# Patient Record
Sex: Male | Born: 1940 | Race: White | Hispanic: No | Marital: Married | Smoking: Former smoker
Health system: Southern US, Community
[De-identification: ages and names within clinical notes are randomized; demographics above are authoritative.]

## PROBLEM LIST (undated history)

## (undated) DIAGNOSIS — I4819 Other persistent atrial fibrillation: Secondary | ICD-10-CM

## (undated) DIAGNOSIS — N289 Disorder of kidney and ureter, unspecified: Secondary | ICD-10-CM

## (undated) DIAGNOSIS — K859 Acute pancreatitis without necrosis or infection, unspecified: Secondary | ICD-10-CM

## (undated) DIAGNOSIS — J449 Chronic obstructive pulmonary disease, unspecified: Secondary | ICD-10-CM

## (undated) DIAGNOSIS — I872 Venous insufficiency (chronic) (peripheral): Secondary | ICD-10-CM

## (undated) DIAGNOSIS — Z7901 Long term (current) use of anticoagulants: Secondary | ICD-10-CM

## (undated) DIAGNOSIS — C679 Malignant neoplasm of bladder, unspecified: Secondary | ICD-10-CM

## (undated) DIAGNOSIS — K219 Gastro-esophageal reflux disease without esophagitis: Secondary | ICD-10-CM

## (undated) DIAGNOSIS — M1990 Unspecified osteoarthritis, unspecified site: Secondary | ICD-10-CM

## (undated) DIAGNOSIS — I5032 Chronic diastolic (congestive) heart failure: Secondary | ICD-10-CM

## (undated) DIAGNOSIS — F329 Major depressive disorder, single episode, unspecified: Secondary | ICD-10-CM

## (undated) DIAGNOSIS — G629 Polyneuropathy, unspecified: Secondary | ICD-10-CM

## (undated) DIAGNOSIS — G4733 Obstructive sleep apnea (adult) (pediatric): Secondary | ICD-10-CM

## (undated) DIAGNOSIS — E039 Hypothyroidism, unspecified: Secondary | ICD-10-CM

## (undated) DIAGNOSIS — I495 Sick sinus syndrome: Secondary | ICD-10-CM

## (undated) DIAGNOSIS — Z8619 Personal history of other infectious and parasitic diseases: Secondary | ICD-10-CM

## (undated) DIAGNOSIS — Z9989 Dependence on other enabling machines and devices: Secondary | ICD-10-CM

## (undated) DIAGNOSIS — E785 Hyperlipidemia, unspecified: Secondary | ICD-10-CM

## (undated) DIAGNOSIS — F32A Depression, unspecified: Secondary | ICD-10-CM

## (undated) HISTORY — DX: Venous insufficiency (chronic) (peripheral): I87.2

## (undated) HISTORY — DX: Morbid (severe) obesity due to excess calories: E66.01

## (undated) HISTORY — DX: Chronic diastolic (congestive) heart failure: I50.32

## (undated) HISTORY — DX: Other persistent atrial fibrillation: I48.19

## (undated) HISTORY — DX: Malignant neoplasm of bladder, unspecified: C67.9

## (undated) HISTORY — DX: Obstructive sleep apnea (adult) (pediatric): G47.33

## (undated) HISTORY — DX: Hyperlipidemia, unspecified: E78.5

## (undated) HISTORY — DX: Obstructive sleep apnea (adult) (pediatric): Z99.89

## (undated) HISTORY — DX: Sick sinus syndrome: I49.5

## (undated) HISTORY — DX: Unspecified osteoarthritis, unspecified site: M19.90

## (undated) HISTORY — PX: WISDOM TOOTH EXTRACTION: SHX21

## (undated) HISTORY — DX: Acute pancreatitis without necrosis or infection, unspecified: K85.90

## (undated) HISTORY — DX: Chronic obstructive pulmonary disease, unspecified: J44.9

## (undated) HISTORY — PX: BELPHAROPTOSIS REPAIR: SHX369

## (undated) HISTORY — DX: Hypothyroidism, unspecified: E03.9

## (undated) HISTORY — PX: CARDIAC CATHETERIZATION: SHX172

## (undated) HISTORY — PX: EYE SURGERY: SHX253

## (undated) HISTORY — DX: Disorder of kidney and ureter, unspecified: N28.9

---
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
## 1965-12-08 HISTORY — PX: NASAL SEPTUM SURGERY: SHX37

## 1998-06-22 ENCOUNTER — Ambulatory Visit (HOSPITAL_COMMUNITY): Admission: RE | Admit: 1998-06-22 | Discharge: 1998-06-22 | Payer: Self-pay | Admitting: Family Medicine

## 1998-07-03 ENCOUNTER — Encounter: Admission: RE | Admit: 1998-07-03 | Discharge: 1998-10-01 | Payer: Self-pay | Admitting: Family Medicine

## 2001-12-08 DIAGNOSIS — Z8619 Personal history of other infectious and parasitic diseases: Secondary | ICD-10-CM

## 2001-12-08 HISTORY — DX: Personal history of other infectious and parasitic diseases: Z86.19

## 2002-02-10 ENCOUNTER — Inpatient Hospital Stay (HOSPITAL_COMMUNITY): Admission: RE | Admit: 2002-02-10 | Discharge: 2002-03-03 | Payer: Self-pay | Admitting: Family Medicine

## 2002-02-10 ENCOUNTER — Encounter: Payer: Self-pay | Admitting: Family Medicine

## 2002-02-10 ENCOUNTER — Encounter: Payer: Self-pay | Admitting: Critical Care Medicine

## 2002-02-12 ENCOUNTER — Encounter: Payer: Self-pay | Admitting: Pulmonary Disease

## 2002-02-13 ENCOUNTER — Encounter: Payer: Self-pay | Admitting: Pulmonary Disease

## 2002-02-15 ENCOUNTER — Encounter: Payer: Self-pay | Admitting: Pulmonary Disease

## 2002-02-16 ENCOUNTER — Encounter: Payer: Self-pay | Admitting: Pulmonary Disease

## 2002-02-17 ENCOUNTER — Encounter: Payer: Self-pay | Admitting: Pulmonary Disease

## 2002-02-18 ENCOUNTER — Encounter: Payer: Self-pay | Admitting: Pulmonary Disease

## 2002-02-19 ENCOUNTER — Encounter: Payer: Self-pay | Admitting: Pulmonary Disease

## 2002-02-20 ENCOUNTER — Encounter: Payer: Self-pay | Admitting: Pulmonary Disease

## 2002-02-21 ENCOUNTER — Encounter: Payer: Self-pay | Admitting: Pulmonary Disease

## 2002-02-22 ENCOUNTER — Encounter: Payer: Self-pay | Admitting: Critical Care Medicine

## 2002-02-26 ENCOUNTER — Encounter: Payer: Self-pay | Admitting: Pulmonary Disease

## 2002-02-27 ENCOUNTER — Encounter: Payer: Self-pay | Admitting: Pulmonary Disease

## 2002-02-28 ENCOUNTER — Encounter: Payer: Self-pay | Admitting: Pulmonary Disease

## 2002-03-02 ENCOUNTER — Encounter: Payer: Self-pay | Admitting: Pulmonary Disease

## 2002-03-03 ENCOUNTER — Inpatient Hospital Stay (HOSPITAL_COMMUNITY)
Admission: AD | Admit: 2002-03-03 | Discharge: 2002-03-11 | Payer: Self-pay | Admitting: Physical Medicine & Rehabilitation

## 2002-06-15 ENCOUNTER — Encounter: Payer: Self-pay | Admitting: Cardiology

## 2002-06-15 ENCOUNTER — Ambulatory Visit (HOSPITAL_COMMUNITY): Admission: RE | Admit: 2002-06-15 | Discharge: 2002-06-15 | Payer: Self-pay | Admitting: Cardiology

## 2002-07-29 ENCOUNTER — Encounter: Payer: Self-pay | Admitting: Pulmonary Disease

## 2002-09-16 ENCOUNTER — Ambulatory Visit (HOSPITAL_BASED_OUTPATIENT_CLINIC_OR_DEPARTMENT_OTHER): Admission: RE | Admit: 2002-09-16 | Discharge: 2002-09-16 | Payer: Self-pay | Admitting: Pulmonary Disease

## 2002-09-16 ENCOUNTER — Encounter: Payer: Self-pay | Admitting: Pulmonary Disease

## 2002-10-07 ENCOUNTER — Encounter: Payer: Self-pay | Admitting: Pulmonary Disease

## 2003-01-05 ENCOUNTER — Encounter: Payer: Self-pay | Admitting: Emergency Medicine

## 2003-01-05 ENCOUNTER — Emergency Department (HOSPITAL_COMMUNITY): Admission: EM | Admit: 2003-01-05 | Discharge: 2003-01-05 | Payer: Self-pay | Admitting: Emergency Medicine

## 2006-02-19 ENCOUNTER — Ambulatory Visit: Payer: Self-pay | Admitting: Endocrinology

## 2006-02-27 ENCOUNTER — Encounter: Admission: RE | Admit: 2006-02-27 | Discharge: 2006-04-08 | Payer: Self-pay | Admitting: Endocrinology

## 2006-03-04 ENCOUNTER — Ambulatory Visit: Payer: Self-pay | Admitting: Internal Medicine

## 2006-03-23 ENCOUNTER — Ambulatory Visit: Payer: Self-pay | Admitting: Endocrinology

## 2006-03-31 ENCOUNTER — Ambulatory Visit: Payer: Self-pay | Admitting: Internal Medicine

## 2006-04-14 ENCOUNTER — Ambulatory Visit: Payer: Self-pay | Admitting: Internal Medicine

## 2006-04-27 ENCOUNTER — Ambulatory Visit: Payer: Self-pay | Admitting: Endocrinology

## 2006-05-21 ENCOUNTER — Ambulatory Visit: Payer: Self-pay | Admitting: Endocrinology

## 2006-07-07 ENCOUNTER — Ambulatory Visit: Payer: Self-pay | Admitting: Endocrinology

## 2006-08-24 ENCOUNTER — Ambulatory Visit: Payer: Self-pay | Admitting: Endocrinology

## 2006-09-29 ENCOUNTER — Ambulatory Visit: Payer: Self-pay | Admitting: Endocrinology

## 2006-09-29 LAB — CONVERTED CEMR LAB
Basophils Absolute: 0.1 10*3/uL (ref 0.0–0.1)
Basophils Relative: 0.6 % (ref 0.0–1.0)
Bilirubin Urine: NEGATIVE
CO2: 27 meq/L (ref 19–32)
Calcium: 9 mg/dL (ref 8.4–10.5)
Cholesterol: 141 mg/dL (ref 0–200)
Creatinine,U: 164.9 mg/dL
Glomerular Filtration Rate, Af Am: 110 mL/min/{1.73_m2}
Glucose, Bld: 222 mg/dL — ABNORMAL HIGH (ref 70–99)
HDL: 29.4 mg/dL — ABNORMAL LOW (ref >39.0)
Hemoglobin, Urine: NEGATIVE
Ketones, ur: NEGATIVE mg/dL
Lymphocytes Relative: 27.5 % (ref 12.0–46.0)
MCV: 96.5 fL (ref 78.0–100.0)
Microalb Creat Ratio: 107.3 mg/g — ABNORMAL HIGH (ref 0.0–30.0)
Microalb, Ur: 17.7 mg/dL — ABNORMAL HIGH (ref 0.0–1.9)
Monocytes Absolute: 0.9 10*3/uL — ABNORMAL HIGH (ref 0.2–0.7)
Neutro Abs: 6.5 10*3/uL (ref 1.4–7.7)
Neutrophils Relative %: 61.2 % (ref 43.0–77.0)
Nitrite: NEGATIVE
Platelets: 311 10*3/uL (ref 150–400)
Potassium: 4.2 meq/L (ref 3.5–5.1)
RBC: 4.72 M/uL (ref 4.22–5.81)
TSH: 2.81 microintl units/mL (ref 0.35–5.50)
Total Bilirubin: 0.5 mg/dL (ref 0.3–1.2)
Total Protein: 7.2 g/dL (ref 6.0–8.3)

## 2006-10-05 ENCOUNTER — Ambulatory Visit: Payer: Self-pay | Admitting: Endocrinology

## 2006-12-09 ENCOUNTER — Ambulatory Visit: Payer: Self-pay | Admitting: Endocrinology

## 2007-02-16 ENCOUNTER — Ambulatory Visit: Payer: Self-pay | Admitting: Endocrinology

## 2007-02-16 LAB — CONVERTED CEMR LAB
BUN: 20 mg/dL (ref 6–23)
CO2: 29 meq/L (ref 19–32)
Calcium: 10.4 mg/dL (ref 8.4–10.5)
Creatinine, Ser: 1 mg/dL (ref 0.4–1.5)
GFR calc Af Amer: 97 mL/min
Potassium: 4.3 meq/L (ref 3.5–5.1)

## 2007-04-08 ENCOUNTER — Ambulatory Visit: Payer: Self-pay | Admitting: Endocrinology

## 2007-05-20 ENCOUNTER — Ambulatory Visit: Payer: Self-pay | Admitting: Endocrinology

## 2007-05-20 LAB — CONVERTED CEMR LAB: Hgb A1c MFr Bld: 9 % — ABNORMAL HIGH (ref 4.6–6.0)

## 2007-06-23 ENCOUNTER — Ambulatory Visit: Payer: Self-pay | Admitting: Endocrinology

## 2007-07-06 ENCOUNTER — Encounter: Payer: Self-pay | Admitting: Endocrinology

## 2007-07-06 DIAGNOSIS — I2510 Atherosclerotic heart disease of native coronary artery without angina pectoris: Secondary | ICD-10-CM | POA: Insufficient documentation

## 2007-07-06 DIAGNOSIS — K219 Gastro-esophageal reflux disease without esophagitis: Secondary | ICD-10-CM | POA: Insufficient documentation

## 2007-07-06 DIAGNOSIS — I1310 Hypertensive heart and chronic kidney disease without heart failure, with stage 1 through stage 4 chronic kidney disease, or unspecified chronic kidney disease: Secondary | ICD-10-CM | POA: Insufficient documentation

## 2007-07-12 ENCOUNTER — Ambulatory Visit: Payer: Self-pay | Admitting: Endocrinology

## 2007-08-19 ENCOUNTER — Ambulatory Visit: Payer: Self-pay | Admitting: Endocrinology

## 2007-09-05 ENCOUNTER — Emergency Department (HOSPITAL_COMMUNITY): Admission: EM | Admit: 2007-09-05 | Discharge: 2007-09-05 | Payer: Self-pay | Admitting: Emergency Medicine

## 2007-09-23 ENCOUNTER — Ambulatory Visit: Payer: Self-pay | Admitting: Endocrinology

## 2007-09-23 LAB — CONVERTED CEMR LAB
AST: 90 units/L — ABNORMAL HIGH (ref 0–37)
Alkaline Phosphatase: 85 units/L (ref 39–117)
Basophils Absolute: 0.2 10*3/uL — ABNORMAL HIGH (ref 0.0–0.1)
Bilirubin Urine: NEGATIVE
Calcium: 9.1 mg/dL (ref 8.4–10.5)
Creatinine, Ser: 1.1 mg/dL (ref 0.4–1.5)
Eosinophils Absolute: 0.3 10*3/uL (ref 0.0–0.6)
Eosinophils Relative: 3.1 % (ref 0.0–5.0)
GFR calc Af Amer: 87 mL/min
HDL: 28.8 mg/dL — ABNORMAL LOW (ref >39.0)
Hemoglobin, Urine: NEGATIVE
Hgb A1c MFr Bld: 10.7 % — ABNORMAL HIGH (ref 4.6–6.0)
Ketones, ur: NEGATIVE mg/dL
LDL Cholesterol: 76 mg/dL (ref 0–99)
MCV: 94.6 fL (ref 78.0–100.0)
Microalb Creat Ratio: 129 mg/g — ABNORMAL HIGH (ref 0.0–30.0)
Microalb, Ur: 11 mg/dL — ABNORMAL HIGH (ref 0.0–1.9)
Monocytes Absolute: 0.6 10*3/uL (ref 0.2–0.7)
Neutro Abs: 4.8 10*3/uL (ref 1.4–7.7)
Neutrophils Relative %: 58.9 % (ref 43.0–77.0)
Nitrite: POSITIVE — AB
PSA: 0.73 ng/mL (ref 0.10–4.00)
RBC: 4.25 M/uL (ref 4.22–5.81)
RDW: 13.2 % (ref 11.5–14.6)
Sodium: 137 meq/L (ref 135–145)
Total Bilirubin: 0.9 mg/dL (ref 0.3–1.2)
Total CHOL/HDL Ratio: 6.4
Triglycerides: 400 mg/dL (ref 0–149)
Urobilinogen, UA: 0.2 (ref 0.0–1.0)
VLDL: 80 mg/dL — ABNORMAL HIGH (ref 0–40)
WBC: 8.4 10*3/uL (ref 4.5–10.5)

## 2007-09-30 ENCOUNTER — Ambulatory Visit: Payer: Self-pay | Admitting: Endocrinology

## 2007-10-27 ENCOUNTER — Encounter: Payer: Self-pay | Admitting: Endocrinology

## 2007-11-30 ENCOUNTER — Encounter: Payer: Self-pay | Admitting: Endocrinology

## 2007-12-01 ENCOUNTER — Telehealth: Payer: Self-pay | Admitting: Endocrinology

## 2007-12-07 ENCOUNTER — Ambulatory Visit: Payer: Self-pay | Admitting: Endocrinology

## 2007-12-07 DIAGNOSIS — R110 Nausea: Secondary | ICD-10-CM | POA: Insufficient documentation

## 2007-12-08 ENCOUNTER — Encounter: Payer: Self-pay | Admitting: Endocrinology

## 2007-12-09 HISTORY — PX: OTHER SURGICAL HISTORY: SHX169

## 2007-12-10 ENCOUNTER — Encounter: Admission: RE | Admit: 2007-12-10 | Discharge: 2007-12-10 | Payer: Self-pay | Admitting: Endocrinology

## 2008-01-06 ENCOUNTER — Telehealth: Payer: Self-pay | Admitting: Internal Medicine

## 2008-01-18 ENCOUNTER — Ambulatory Visit: Payer: Self-pay | Admitting: Endocrinology

## 2008-01-18 DIAGNOSIS — E7800 Pure hypercholesterolemia, unspecified: Secondary | ICD-10-CM | POA: Insufficient documentation

## 2008-01-18 LAB — CONVERTED CEMR LAB
Albumin: 2.9 g/dL — ABNORMAL LOW (ref 3.5–5.2)
Bilirubin, Direct: 0.1 mg/dL (ref 0.0–0.3)
Direct LDL: 97.9 mg/dL
Triglycerides: 253 mg/dL (ref 0–149)
VLDL: 51 mg/dL — ABNORMAL HIGH (ref 0–40)

## 2008-03-10 ENCOUNTER — Ambulatory Visit: Payer: Self-pay | Admitting: Endocrinology

## 2008-03-28 ENCOUNTER — Ambulatory Visit: Payer: Self-pay | Admitting: Endocrinology

## 2008-03-28 DIAGNOSIS — L723 Sebaceous cyst: Secondary | ICD-10-CM | POA: Insufficient documentation

## 2008-04-24 ENCOUNTER — Ambulatory Visit: Payer: Self-pay | Admitting: Endocrinology

## 2008-04-24 DIAGNOSIS — R0602 Shortness of breath: Secondary | ICD-10-CM | POA: Insufficient documentation

## 2008-05-03 ENCOUNTER — Ambulatory Visit: Payer: Self-pay

## 2008-05-06 ENCOUNTER — Ambulatory Visit: Payer: Self-pay | Admitting: *Deleted

## 2008-05-07 ENCOUNTER — Inpatient Hospital Stay (HOSPITAL_COMMUNITY): Admission: EM | Admit: 2008-05-07 | Discharge: 2008-05-07 | Payer: Self-pay | Admitting: Emergency Medicine

## 2008-05-18 ENCOUNTER — Encounter (INDEPENDENT_AMBULATORY_CARE_PROVIDER_SITE_OTHER): Payer: Self-pay | Admitting: *Deleted

## 2008-05-18 ENCOUNTER — Ambulatory Visit: Payer: Self-pay | Admitting: Endocrinology

## 2008-05-18 DIAGNOSIS — H409 Unspecified glaucoma: Secondary | ICD-10-CM | POA: Insufficient documentation

## 2008-05-18 DIAGNOSIS — J449 Chronic obstructive pulmonary disease, unspecified: Secondary | ICD-10-CM | POA: Insufficient documentation

## 2008-06-05 ENCOUNTER — Ambulatory Visit: Payer: Self-pay | Admitting: Pulmonary Disease

## 2008-06-07 ENCOUNTER — Encounter: Payer: Self-pay | Admitting: Pulmonary Disease

## 2008-06-07 ENCOUNTER — Ambulatory Visit: Payer: Self-pay

## 2008-06-20 ENCOUNTER — Ambulatory Visit: Payer: Self-pay | Admitting: Endocrinology

## 2008-06-20 DIAGNOSIS — K7689 Other specified diseases of liver: Secondary | ICD-10-CM | POA: Insufficient documentation

## 2008-06-20 DIAGNOSIS — N3000 Acute cystitis without hematuria: Secondary | ICD-10-CM | POA: Insufficient documentation

## 2008-06-20 LAB — CONVERTED CEMR LAB
Basophils Absolute: 0.2 10*3/uL — ABNORMAL HIGH (ref 0.0–0.1)
Basophils Relative: 1.3 % — ABNORMAL HIGH (ref 0.0–1.0)
Bilirubin Urine: NEGATIVE
Crystals: NEGATIVE
Eosinophils Absolute: 0.1 10*3/uL (ref 0.0–0.7)
Hemoglobin: 13.3 g/dL (ref 13.0–17.0)
Hep B S Ab: NEGATIVE
Iron: 37 ug/dL — ABNORMAL LOW (ref 42–165)
Ketones, ur: NEGATIVE mg/dL
Lymphocytes Relative: 12.6 % (ref 12.0–46.0)
Monocytes Absolute: 1.2 10*3/uL — ABNORMAL HIGH (ref 0.1–1.0)
Mucus, UA: NEGATIVE
Nitrite: NEGATIVE
RBC / HPF: NONE SEEN
Urine Glucose: NEGATIVE mg/dL

## 2008-06-22 ENCOUNTER — Ambulatory Visit: Payer: Self-pay | Admitting: Endocrinology

## 2008-06-22 DIAGNOSIS — D509 Iron deficiency anemia, unspecified: Secondary | ICD-10-CM | POA: Insufficient documentation

## 2008-06-22 DIAGNOSIS — D72829 Elevated white blood cell count, unspecified: Secondary | ICD-10-CM | POA: Insufficient documentation

## 2008-06-22 LAB — CONVERTED CEMR LAB
BUN: 43 mg/dL — ABNORMAL HIGH (ref 6–23)
CO2: 22 meq/L (ref 19–32)
Chloride: 103 meq/L (ref 96–112)
GFR calc Af Amer: 46 mL/min
GFR calc non Af Amer: 38 mL/min
Sodium: 136 meq/L (ref 135–145)

## 2008-06-26 ENCOUNTER — Encounter (INDEPENDENT_AMBULATORY_CARE_PROVIDER_SITE_OTHER): Payer: Self-pay | Admitting: Emergency Medicine

## 2008-06-26 ENCOUNTER — Ambulatory Visit: Payer: Self-pay | Admitting: *Deleted

## 2008-06-26 ENCOUNTER — Inpatient Hospital Stay (HOSPITAL_COMMUNITY): Admission: EM | Admit: 2008-06-26 | Discharge: 2008-07-02 | Payer: Self-pay | Admitting: Emergency Medicine

## 2008-06-26 ENCOUNTER — Ambulatory Visit: Payer: Self-pay | Admitting: Internal Medicine

## 2008-06-26 ENCOUNTER — Encounter (INDEPENDENT_AMBULATORY_CARE_PROVIDER_SITE_OTHER): Payer: Self-pay | Admitting: *Deleted

## 2008-06-27 ENCOUNTER — Telehealth: Payer: Self-pay | Admitting: Endocrinology

## 2008-06-28 ENCOUNTER — Ambulatory Visit: Payer: Self-pay | Admitting: Infectious Disease

## 2008-06-29 ENCOUNTER — Encounter: Payer: Self-pay | Admitting: Cardiology

## 2008-07-04 ENCOUNTER — Ambulatory Visit: Payer: Self-pay | Admitting: Internal Medicine

## 2008-07-10 ENCOUNTER — Ambulatory Visit: Payer: Self-pay | Admitting: *Deleted

## 2008-07-11 ENCOUNTER — Ambulatory Visit: Payer: Self-pay | Admitting: Cardiology

## 2008-07-18 ENCOUNTER — Ambulatory Visit: Payer: Self-pay | Admitting: Cardiovascular Disease

## 2008-07-19 ENCOUNTER — Ambulatory Visit: Payer: Self-pay | Admitting: Cardiology

## 2008-07-26 ENCOUNTER — Ambulatory Visit: Payer: Self-pay | Admitting: Cardiology

## 2008-08-01 ENCOUNTER — Ambulatory Visit: Payer: Self-pay | Admitting: Endocrinology

## 2008-08-01 DIAGNOSIS — J309 Allergic rhinitis, unspecified: Secondary | ICD-10-CM | POA: Insufficient documentation

## 2008-08-01 DIAGNOSIS — I498 Other specified cardiac arrhythmias: Secondary | ICD-10-CM | POA: Insufficient documentation

## 2008-08-02 ENCOUNTER — Telehealth (INDEPENDENT_AMBULATORY_CARE_PROVIDER_SITE_OTHER): Payer: Self-pay | Admitting: *Deleted

## 2008-08-03 ENCOUNTER — Telehealth (INDEPENDENT_AMBULATORY_CARE_PROVIDER_SITE_OTHER): Payer: Self-pay | Admitting: *Deleted

## 2008-08-03 ENCOUNTER — Ambulatory Visit: Payer: Self-pay | Admitting: *Deleted

## 2008-08-09 ENCOUNTER — Ambulatory Visit: Payer: Self-pay | Admitting: Internal Medicine

## 2008-08-10 ENCOUNTER — Encounter: Payer: Self-pay | Admitting: Endocrinology

## 2008-08-23 ENCOUNTER — Ambulatory Visit: Payer: Self-pay | Admitting: Cardiovascular Disease

## 2008-09-07 ENCOUNTER — Ambulatory Visit: Payer: Self-pay | Admitting: Endocrinology

## 2008-09-07 ENCOUNTER — Ambulatory Visit: Payer: Self-pay | Admitting: *Deleted

## 2008-09-07 DIAGNOSIS — E875 Hyperkalemia: Secondary | ICD-10-CM | POA: Insufficient documentation

## 2008-09-07 LAB — CONVERTED CEMR LAB
Calcium: 8.6 mg/dL (ref 8.4–10.5)
Chloride: 104 meq/L (ref 96–112)
Creatinine, Ser: 1.5 mg/dL (ref 0.4–1.5)
GFR calc Af Amer: 60 mL/min
Glucose, Bld: 121 mg/dL — ABNORMAL HIGH (ref 70–99)
Sodium: 140 meq/L (ref 135–145)

## 2008-09-19 ENCOUNTER — Ambulatory Visit: Payer: Self-pay | Admitting: Internal Medicine

## 2008-10-17 ENCOUNTER — Ambulatory Visit: Payer: Self-pay | Admitting: Internal Medicine

## 2008-10-26 ENCOUNTER — Ambulatory Visit: Payer: Self-pay | Admitting: Endocrinology

## 2008-11-14 ENCOUNTER — Ambulatory Visit: Payer: Self-pay | Admitting: Cardiology

## 2008-11-28 ENCOUNTER — Ambulatory Visit: Payer: Self-pay | Admitting: Internal Medicine

## 2008-12-12 ENCOUNTER — Ambulatory Visit: Payer: Self-pay | Admitting: Internal Medicine

## 2008-12-23 ENCOUNTER — Telehealth (INDEPENDENT_AMBULATORY_CARE_PROVIDER_SITE_OTHER): Payer: Self-pay | Admitting: *Deleted

## 2008-12-26 ENCOUNTER — Ambulatory Visit: Payer: Self-pay | Admitting: Cardiology

## 2008-12-27 ENCOUNTER — Inpatient Hospital Stay (HOSPITAL_COMMUNITY): Admission: EM | Admit: 2008-12-27 | Discharge: 2008-12-29 | Payer: Self-pay | Admitting: Emergency Medicine

## 2008-12-27 ENCOUNTER — Ambulatory Visit: Payer: Self-pay | Admitting: Cardiology

## 2008-12-27 ENCOUNTER — Ambulatory Visit: Payer: Self-pay | Admitting: Internal Medicine

## 2009-01-09 ENCOUNTER — Ambulatory Visit: Payer: Self-pay | Admitting: Endocrinology

## 2009-01-09 ENCOUNTER — Ambulatory Visit: Payer: Self-pay | Admitting: Cardiology

## 2009-01-09 DIAGNOSIS — R609 Edema, unspecified: Secondary | ICD-10-CM | POA: Insufficient documentation

## 2009-01-09 LAB — CONVERTED CEMR LAB
CO2: 22 meq/L (ref 19–32)
Cholesterol: 181 mg/dL (ref 0–200)
Creatinine, Ser: 1.8 mg/dL — ABNORMAL HIGH (ref 0.4–1.5)
GFR calc Af Amer: 49 mL/min
GFR calc non Af Amer: 40 mL/min
HDL: 25.1 mg/dL — ABNORMAL LOW (ref >39.0)
Sodium: 139 meq/L (ref 135–145)

## 2009-01-11 ENCOUNTER — Ambulatory Visit: Payer: Self-pay | Admitting: Internal Medicine

## 2009-01-11 LAB — CONVERTED CEMR LAB
Chloride: 107 meq/L (ref 96–112)
GFR calc Af Amer: 60 mL/min
Sodium: 142 meq/L (ref 135–145)

## 2009-01-19 ENCOUNTER — Ambulatory Visit: Payer: Self-pay | Admitting: Internal Medicine

## 2009-01-19 LAB — CONVERTED CEMR LAB
CO2: 26 meq/L (ref 19–32)
Calcium: 9.1 mg/dL (ref 8.4–10.5)
Chloride: 102 meq/L (ref 96–112)
GFR calc Af Amer: 56 mL/min
Glucose, Bld: 328 mg/dL — ABNORMAL HIGH (ref 70–99)

## 2009-01-25 ENCOUNTER — Ambulatory Visit: Payer: Self-pay | Admitting: Internal Medicine

## 2009-01-30 ENCOUNTER — Ambulatory Visit: Payer: Self-pay | Admitting: Cardiovascular Disease

## 2009-02-01 ENCOUNTER — Telehealth: Payer: Self-pay | Admitting: Endocrinology

## 2009-02-07 ENCOUNTER — Ambulatory Visit: Payer: Self-pay | Admitting: Internal Medicine

## 2009-02-07 LAB — CONVERTED CEMR LAB
BUN: 29 mg/dL — ABNORMAL HIGH (ref 6–23)
CO2: 28 meq/L (ref 19–32)
Chloride: 104 meq/L (ref 96–112)
Cholesterol: 169 mg/dL (ref 0–200)
Creatinine, Ser: 1.6 mg/dL — ABNORMAL HIGH (ref 0.4–1.5)
Direct LDL: 104.2 mg/dL
Potassium: 4.5 meq/L (ref 3.5–5.1)
Triglycerides: 271 mg/dL (ref 0–149)
VLDL: 54 mg/dL — ABNORMAL HIGH (ref 0–40)

## 2009-02-21 ENCOUNTER — Encounter: Payer: Self-pay | Admitting: Endocrinology

## 2009-02-27 ENCOUNTER — Ambulatory Visit: Payer: Self-pay | Admitting: Cardiovascular Disease

## 2009-03-08 ENCOUNTER — Ambulatory Visit: Payer: Self-pay | Admitting: Internal Medicine

## 2009-03-08 ENCOUNTER — Ambulatory Visit: Payer: Self-pay | Admitting: Pulmonary Disease

## 2009-03-08 DIAGNOSIS — R05 Cough: Secondary | ICD-10-CM

## 2009-03-08 DIAGNOSIS — R059 Cough, unspecified: Secondary | ICD-10-CM | POA: Insufficient documentation

## 2009-03-15 ENCOUNTER — Ambulatory Visit: Payer: Self-pay | Admitting: Endocrinology

## 2009-03-27 ENCOUNTER — Ambulatory Visit: Payer: Self-pay | Admitting: Cardiology

## 2009-04-24 ENCOUNTER — Ambulatory Visit: Payer: Self-pay | Admitting: Internal Medicine

## 2009-04-26 ENCOUNTER — Ambulatory Visit: Payer: Self-pay | Admitting: Endocrinology

## 2009-04-26 DIAGNOSIS — H9190 Unspecified hearing loss, unspecified ear: Secondary | ICD-10-CM | POA: Insufficient documentation

## 2009-04-26 LAB — CONVERTED CEMR LAB
Alkaline Phosphatase: 86 units/L (ref 39–117)
BUN: 33 mg/dL — ABNORMAL HIGH (ref 6–23)
Basophils Absolute: 0 10*3/uL (ref 0.0–0.1)
Bilirubin Urine: NEGATIVE
Bilirubin, Direct: 0.2 mg/dL (ref 0.0–0.3)
CO2: 26 meq/L (ref 19–32)
Creatinine, Ser: 1.8 mg/dL — ABNORMAL HIGH (ref 0.4–1.5)
Eosinophils Absolute: 0.2 10*3/uL (ref 0.0–0.7)
Eosinophils Relative: 2 % (ref 0.0–5.0)
Ketones, ur: NEGATIVE mg/dL
Lymphocytes Relative: 34.6 % (ref 12.0–46.0)
Monocytes Absolute: 0.9 10*3/uL (ref 0.1–1.0)
Monocytes Relative: 9 % (ref 3.0–12.0)
Neutrophils Relative %: 54.3 % (ref 43.0–77.0)
PSA: 0.5 ng/mL (ref 0.10–4.00)
Platelets: 244 10*3/uL (ref 150.0–400.0)
Potassium: 4.8 meq/L (ref 3.5–5.1)
RDW: 14.6 % (ref 11.5–14.6)
Saturation Ratios: 19.3 % — ABNORMAL LOW (ref 20.0–50.0)
Specific Gravity, Urine: 1.025 (ref 1.000–1.030)
Total CHOL/HDL Ratio: 9
Total Protein, Urine: 100 mg/dL
Urine Glucose: NEGATIVE mg/dL
Urobilinogen, UA: 0.2 (ref 0.0–1.0)
pH: 6 (ref 5.0–8.0)

## 2009-05-08 ENCOUNTER — Encounter: Payer: Self-pay | Admitting: *Deleted

## 2009-05-17 ENCOUNTER — Ambulatory Visit: Payer: Self-pay | Admitting: Cardiology

## 2009-06-01 ENCOUNTER — Ambulatory Visit: Payer: Self-pay | Admitting: Endocrinology

## 2009-06-13 ENCOUNTER — Encounter: Payer: Self-pay | Admitting: *Deleted

## 2009-06-14 ENCOUNTER — Ambulatory Visit: Payer: Self-pay | Admitting: Internal Medicine

## 2009-06-14 LAB — CONVERTED CEMR LAB: Prothrombin Time: 17.9 s

## 2009-07-05 ENCOUNTER — Telehealth: Payer: Self-pay | Admitting: Internal Medicine

## 2009-07-06 ENCOUNTER — Encounter (INDEPENDENT_AMBULATORY_CARE_PROVIDER_SITE_OTHER): Payer: Self-pay | Admitting: Cardiology

## 2009-07-06 ENCOUNTER — Ambulatory Visit: Payer: Self-pay | Admitting: Cardiology

## 2009-07-19 ENCOUNTER — Ambulatory Visit: Payer: Self-pay | Admitting: Internal Medicine

## 2009-08-03 ENCOUNTER — Ambulatory Visit: Payer: Self-pay | Admitting: Cardiovascular Disease

## 2009-08-28 ENCOUNTER — Encounter: Payer: Self-pay | Admitting: Endocrinology

## 2009-08-31 ENCOUNTER — Ambulatory Visit: Payer: Self-pay | Admitting: Internal Medicine

## 2009-09-10 ENCOUNTER — Ambulatory Visit: Payer: Self-pay | Admitting: Endocrinology

## 2009-09-11 LAB — CONVERTED CEMR LAB: Hgb A1c MFr Bld: 9.6 % — ABNORMAL HIGH (ref 4.6–6.5)

## 2009-09-19 ENCOUNTER — Encounter (INDEPENDENT_AMBULATORY_CARE_PROVIDER_SITE_OTHER): Payer: Self-pay | Admitting: *Deleted

## 2009-09-28 ENCOUNTER — Ambulatory Visit: Payer: Self-pay | Admitting: Cardiology

## 2009-10-01 ENCOUNTER — Ambulatory Visit: Payer: Self-pay | Admitting: Endocrinology

## 2009-10-01 DIAGNOSIS — E291 Testicular hypofunction: Secondary | ICD-10-CM | POA: Insufficient documentation

## 2009-10-01 DIAGNOSIS — N529 Male erectile dysfunction, unspecified: Secondary | ICD-10-CM | POA: Insufficient documentation

## 2009-10-01 LAB — CONVERTED CEMR LAB
Calcium: 9.3 mg/dL (ref 8.4–10.5)
Chloride: 101 meq/L (ref 96–112)
Creatinine, Ser: 1.4 mg/dL (ref 0.4–1.5)
GFR calc non Af Amer: 53.81 mL/min (ref >60)
Sodium: 140 meq/L (ref 135–145)

## 2009-10-11 ENCOUNTER — Ambulatory Visit: Payer: Self-pay | Admitting: Endocrinology

## 2009-10-13 LAB — CONVERTED CEMR LAB: Prolactin: 8.9 ng/mL

## 2009-10-26 ENCOUNTER — Ambulatory Visit: Payer: Self-pay | Admitting: Cardiology

## 2009-10-26 LAB — CONVERTED CEMR LAB: POC INR: 1.9

## 2009-10-29 ENCOUNTER — Ambulatory Visit: Payer: Self-pay | Admitting: Internal Medicine

## 2009-11-06 ENCOUNTER — Ambulatory Visit: Payer: Self-pay | Admitting: Endocrinology

## 2009-11-06 DIAGNOSIS — G4730 Sleep apnea, unspecified: Secondary | ICD-10-CM | POA: Insufficient documentation

## 2009-11-06 DIAGNOSIS — N259 Disorder resulting from impaired renal tubular function, unspecified: Secondary | ICD-10-CM | POA: Insufficient documentation

## 2009-11-06 LAB — CONVERTED CEMR LAB: Fructosamine: 251 micromoles/L (ref <285)

## 2009-11-08 ENCOUNTER — Ambulatory Visit: Payer: Self-pay | Admitting: Pulmonary Disease

## 2009-11-08 DIAGNOSIS — G4733 Obstructive sleep apnea (adult) (pediatric): Secondary | ICD-10-CM | POA: Insufficient documentation

## 2009-11-08 DIAGNOSIS — J019 Acute sinusitis, unspecified: Secondary | ICD-10-CM | POA: Insufficient documentation

## 2009-11-14 ENCOUNTER — Ambulatory Visit: Payer: Self-pay | Admitting: Pulmonary Disease

## 2009-11-23 ENCOUNTER — Ambulatory Visit (HOSPITAL_COMMUNITY): Admission: RE | Admit: 2009-11-23 | Discharge: 2009-11-23 | Payer: Self-pay | Admitting: Internal Medicine

## 2009-11-23 ENCOUNTER — Ambulatory Visit: Payer: Self-pay | Admitting: Cardiology

## 2009-11-23 ENCOUNTER — Ambulatory Visit: Payer: Self-pay | Admitting: Internal Medicine

## 2009-11-23 ENCOUNTER — Ambulatory Visit: Payer: Self-pay

## 2009-11-23 ENCOUNTER — Encounter: Payer: Self-pay | Admitting: Cardiology

## 2009-11-27 ENCOUNTER — Ambulatory Visit: Payer: Self-pay | Admitting: Endocrinology

## 2009-11-28 ENCOUNTER — Telehealth: Payer: Self-pay | Admitting: Internal Medicine

## 2009-11-28 ENCOUNTER — Encounter (INDEPENDENT_AMBULATORY_CARE_PROVIDER_SITE_OTHER): Payer: Self-pay | Admitting: *Deleted

## 2009-11-28 ENCOUNTER — Telehealth: Payer: Self-pay | Admitting: Pulmonary Disease

## 2009-12-08 HISTORY — PX: INSERT / REPLACE / REMOVE PACEMAKER: SUR710

## 2009-12-11 ENCOUNTER — Telehealth (INDEPENDENT_AMBULATORY_CARE_PROVIDER_SITE_OTHER): Payer: Self-pay | Admitting: *Deleted

## 2009-12-11 DIAGNOSIS — J329 Chronic sinusitis, unspecified: Secondary | ICD-10-CM | POA: Insufficient documentation

## 2009-12-21 ENCOUNTER — Telehealth (INDEPENDENT_AMBULATORY_CARE_PROVIDER_SITE_OTHER): Payer: Self-pay | Admitting: *Deleted

## 2009-12-25 ENCOUNTER — Ambulatory Visit: Payer: Self-pay | Admitting: Cardiology

## 2009-12-25 ENCOUNTER — Encounter (INDEPENDENT_AMBULATORY_CARE_PROVIDER_SITE_OTHER): Payer: Self-pay | Admitting: *Deleted

## 2009-12-25 ENCOUNTER — Ambulatory Visit: Payer: Self-pay | Admitting: Gastroenterology

## 2009-12-25 LAB — CONVERTED CEMR LAB: POC INR: 2.1

## 2009-12-26 ENCOUNTER — Ambulatory Visit: Payer: Self-pay | Admitting: Pulmonary Disease

## 2009-12-26 ENCOUNTER — Telehealth (INDEPENDENT_AMBULATORY_CARE_PROVIDER_SITE_OTHER): Payer: Self-pay | Admitting: *Deleted

## 2009-12-26 DIAGNOSIS — R071 Chest pain on breathing: Secondary | ICD-10-CM | POA: Insufficient documentation

## 2009-12-27 ENCOUNTER — Encounter: Payer: Self-pay | Admitting: Pulmonary Disease

## 2010-01-02 ENCOUNTER — Ambulatory Visit: Payer: Self-pay | Admitting: Gastroenterology

## 2010-01-02 LAB — CONVERTED CEMR LAB: Fecal Occult Bld: NEGATIVE

## 2010-01-14 ENCOUNTER — Telehealth: Payer: Self-pay | Admitting: Gastroenterology

## 2010-01-22 ENCOUNTER — Telehealth (INDEPENDENT_AMBULATORY_CARE_PROVIDER_SITE_OTHER): Payer: Self-pay | Admitting: *Deleted

## 2010-01-22 ENCOUNTER — Ambulatory Visit: Payer: Self-pay | Admitting: Internal Medicine

## 2010-01-29 ENCOUNTER — Ambulatory Visit: Payer: Self-pay | Admitting: Endocrinology

## 2010-01-29 LAB — CONVERTED CEMR LAB
CO2: 29 meq/L (ref 19–32)
Calcium: 9.2 mg/dL (ref 8.4–10.5)
Creatinine, Ser: 1.5 mg/dL (ref 0.4–1.5)
Glucose, Bld: 58 mg/dL — ABNORMAL LOW (ref 70–99)
Sodium: 141 meq/L (ref 135–145)

## 2010-01-31 ENCOUNTER — Encounter: Admission: RE | Admit: 2010-01-31 | Discharge: 2010-01-31 | Payer: Self-pay | Admitting: Gastroenterology

## 2010-02-12 ENCOUNTER — Ambulatory Visit: Payer: Self-pay | Admitting: Pulmonary Disease

## 2010-02-19 ENCOUNTER — Ambulatory Visit: Payer: Self-pay | Admitting: Cardiology

## 2010-03-04 ENCOUNTER — Telehealth (INDEPENDENT_AMBULATORY_CARE_PROVIDER_SITE_OTHER): Payer: Self-pay | Admitting: *Deleted

## 2010-03-19 ENCOUNTER — Ambulatory Visit: Payer: Self-pay | Admitting: Cardiovascular Disease

## 2010-03-28 ENCOUNTER — Encounter: Payer: Self-pay | Admitting: Endocrinology

## 2010-04-16 ENCOUNTER — Ambulatory Visit: Payer: Self-pay | Admitting: Internal Medicine

## 2010-04-16 ENCOUNTER — Ambulatory Visit: Payer: Self-pay | Admitting: Cardiovascular Disease

## 2010-04-16 DIAGNOSIS — I5032 Chronic diastolic (congestive) heart failure: Secondary | ICD-10-CM | POA: Insufficient documentation

## 2010-04-16 LAB — CONVERTED CEMR LAB: POC INR: 2

## 2010-04-23 ENCOUNTER — Emergency Department (HOSPITAL_COMMUNITY): Admission: EM | Admit: 2010-04-23 | Discharge: 2010-04-23 | Payer: Self-pay | Admitting: Emergency Medicine

## 2010-04-23 ENCOUNTER — Ambulatory Visit: Payer: Self-pay | Admitting: Internal Medicine

## 2010-04-30 ENCOUNTER — Ambulatory Visit: Payer: Self-pay | Admitting: Endocrinology

## 2010-04-30 DIAGNOSIS — Z87891 Personal history of nicotine dependence: Secondary | ICD-10-CM | POA: Insufficient documentation

## 2010-04-30 LAB — CONVERTED CEMR LAB
ALT: 48 units/L (ref 0–53)
AST: 57 units/L — ABNORMAL HIGH (ref 0–37)
Alkaline Phosphatase: 61 units/L (ref 39–117)
BUN: 25 mg/dL — ABNORMAL HIGH (ref 6–23)
Bilirubin, Direct: 0.1 mg/dL (ref 0.0–0.3)
Creatinine, Ser: 1.6 mg/dL — ABNORMAL HIGH (ref 0.4–1.5)
Direct LDL: 125.5 mg/dL
Eosinophils Relative: 1.6 % (ref 0.0–5.0)
Folate: >20 ng/mL
GFR calc non Af Amer: 47.41 mL/min (ref >60)
HCT: 46.7 % (ref 39.0–52.0)
HDL: 32.3 mg/dL — ABNORMAL LOW (ref >39.00)
Leukocytes, UA: NEGATIVE
Microalb Creat Ratio: 100.8 mg/g — ABNORMAL HIGH (ref 0.0–30.0)
Monocytes Relative: 6.5 % (ref 3.0–12.0)
Neutrophils Relative %: 64.1 % (ref 43.0–77.0)
Nitrite: NEGATIVE
PSA: 0.68 ng/mL (ref 0.10–4.00)
Platelets: 268 10*3/uL (ref 150.0–400.0)
Potassium: 5.2 meq/L — ABNORMAL HIGH (ref 3.5–5.1)
Specific Gravity, Urine: 1.02 (ref 1.000–1.030)
Total Bilirubin: 0.5 mg/dL (ref 0.3–1.2)
Transferrin: 311 mg/dL (ref 212.0–360.0)
VLDL: 58.6 mg/dL — ABNORMAL HIGH (ref 0.0–40.0)
Vitamin B-12: 242 pg/mL (ref 211–911)
WBC: 11.8 10*3/uL — ABNORMAL HIGH (ref 4.5–10.5)
pH: 6.5 (ref 5.0–8.0)

## 2010-05-16 ENCOUNTER — Ambulatory Visit: Payer: Self-pay | Admitting: Internal Medicine

## 2010-05-30 ENCOUNTER — Ambulatory Visit: Payer: Self-pay | Admitting: Internal Medicine

## 2010-06-04 ENCOUNTER — Ambulatory Visit: Payer: Self-pay | Admitting: Internal Medicine

## 2010-06-05 ENCOUNTER — Encounter: Payer: Self-pay | Admitting: Cardiology

## 2010-06-05 ENCOUNTER — Telehealth: Payer: Self-pay | Admitting: Internal Medicine

## 2010-06-05 LAB — CONVERTED CEMR LAB
BUN: 28 mg/dL — ABNORMAL HIGH (ref 6–23)
Basophils Absolute: 0 10*3/uL (ref 0.0–0.1)
Calcium: 9 mg/dL (ref 8.4–10.5)
Eosinophils Relative: 2.3 % (ref 0.0–5.0)
GFR calc non Af Amer: 45.7 mL/min (ref >60)
HCT: 43.2 % (ref 39.0–52.0)
INR: 1.8 — ABNORMAL HIGH (ref 0.8–1.0)
Lymphocytes Relative: 29.9 % (ref 12.0–46.0)
Lymphs Abs: 2.9 10*3/uL (ref 0.7–4.0)
Monocytes Relative: 6.4 % (ref 3.0–12.0)
POC INR: 1.8
Platelets: 286 10*3/uL (ref 150.0–400.0)
Potassium: 4.6 meq/L (ref 3.5–5.1)
Prothrombin Time: 19.5 s — ABNORMAL HIGH (ref 9.7–11.8)
Prothrombin Time: 19.5 s
Sodium: 143 meq/L (ref 135–145)
WBC: 9.6 10*3/uL (ref 4.5–10.5)
aPTT: 39.8 s — ABNORMAL HIGH (ref 21.7–28.8)

## 2010-06-07 DIAGNOSIS — I495 Sick sinus syndrome: Secondary | ICD-10-CM

## 2010-06-07 HISTORY — DX: Sick sinus syndrome: I49.5

## 2010-06-07 HISTORY — PX: PACEMAKER PLACEMENT: SHX43

## 2010-06-11 ENCOUNTER — Ambulatory Visit (HOSPITAL_COMMUNITY): Admission: RE | Admit: 2010-06-11 | Discharge: 2010-06-12 | Payer: Self-pay | Admitting: Internal Medicine

## 2010-06-11 ENCOUNTER — Ambulatory Visit: Payer: Self-pay | Admitting: Internal Medicine

## 2010-06-12 ENCOUNTER — Encounter: Payer: Self-pay | Admitting: Internal Medicine

## 2010-06-18 ENCOUNTER — Encounter: Payer: Self-pay | Admitting: Internal Medicine

## 2010-06-20 ENCOUNTER — Ambulatory Visit: Payer: Self-pay | Admitting: Endocrinology

## 2010-06-20 ENCOUNTER — Ambulatory Visit: Payer: Self-pay | Admitting: Internal Medicine

## 2010-06-21 LAB — CONVERTED CEMR LAB
BUN: 36 mg/dL — ABNORMAL HIGH (ref 6–23)
Calcium: 9.4 mg/dL (ref 8.4–10.5)
Creatinine, Ser: 1.8 mg/dL — ABNORMAL HIGH (ref 0.4–1.5)
GFR calc non Af Amer: 41.5 mL/min (ref >60)
Glucose, Bld: 232 mg/dL — ABNORMAL HIGH (ref 70–99)

## 2010-06-24 ENCOUNTER — Ambulatory Visit: Payer: Self-pay

## 2010-07-10 ENCOUNTER — Encounter: Payer: Self-pay | Admitting: Endocrinology

## 2010-07-18 ENCOUNTER — Ambulatory Visit: Payer: Self-pay | Admitting: Cardiovascular Disease

## 2010-07-18 LAB — CONVERTED CEMR LAB: POC INR: 2.2

## 2010-07-30 ENCOUNTER — Telehealth: Payer: Self-pay | Admitting: Endocrinology

## 2010-07-30 ENCOUNTER — Ambulatory Visit: Payer: Self-pay | Admitting: Endocrinology

## 2010-07-30 ENCOUNTER — Ambulatory Visit: Payer: Self-pay | Admitting: Internal Medicine

## 2010-07-30 LAB — CONVERTED CEMR LAB
ALT: 92 units/L — ABNORMAL HIGH (ref 0–53)
Alkaline Phosphatase: 80 units/L (ref 39–117)
Bilirubin, Direct: 0.2 mg/dL (ref 0.0–0.3)
Total Bilirubin: 0.5 mg/dL (ref 0.3–1.2)
Total Protein: 7.8 g/dL (ref 6.0–8.3)

## 2010-08-16 ENCOUNTER — Ambulatory Visit: Payer: Self-pay | Admitting: Internal Medicine

## 2010-08-16 LAB — CONVERTED CEMR LAB: POC INR: 1.9

## 2010-08-27 ENCOUNTER — Ambulatory Visit: Payer: Self-pay | Admitting: Pulmonary Disease

## 2010-09-03 ENCOUNTER — Ambulatory Visit: Payer: Self-pay | Admitting: Endocrinology

## 2010-09-03 LAB — CONVERTED CEMR LAB
ALT: 97 units/L — ABNORMAL HIGH (ref 0–53)
AST: 86 units/L — ABNORMAL HIGH (ref 0–37)
Albumin: 4.1 g/dL (ref 3.5–5.2)
Alkaline Phosphatase: 93 units/L (ref 39–117)
Hgb A1c MFr Bld: 9.1 % — ABNORMAL HIGH (ref 4.6–6.5)

## 2010-09-09 ENCOUNTER — Telehealth: Payer: Self-pay | Admitting: Pulmonary Disease

## 2010-09-13 ENCOUNTER — Ambulatory Visit: Payer: Self-pay | Admitting: Cardiology

## 2010-09-26 ENCOUNTER — Ambulatory Visit: Payer: Self-pay | Admitting: Internal Medicine

## 2010-10-01 ENCOUNTER — Telehealth: Payer: Self-pay | Admitting: Pulmonary Disease

## 2010-10-07 ENCOUNTER — Encounter: Payer: Self-pay | Admitting: Pulmonary Disease

## 2010-10-11 ENCOUNTER — Ambulatory Visit: Payer: Self-pay | Admitting: Cardiology

## 2010-11-01 ENCOUNTER — Encounter: Payer: Self-pay | Admitting: Endocrinology

## 2010-11-04 ENCOUNTER — Encounter (INDEPENDENT_AMBULATORY_CARE_PROVIDER_SITE_OTHER): Payer: Self-pay | Admitting: *Deleted

## 2010-11-04 ENCOUNTER — Inpatient Hospital Stay (HOSPITAL_COMMUNITY)
Admission: EM | Admit: 2010-11-04 | Discharge: 2010-11-07 | Payer: Self-pay | Source: Home / Self Care | Admitting: Emergency Medicine

## 2010-11-05 ENCOUNTER — Encounter (INDEPENDENT_AMBULATORY_CARE_PROVIDER_SITE_OTHER): Payer: Self-pay | Admitting: Internal Medicine

## 2010-11-05 ENCOUNTER — Encounter (INDEPENDENT_AMBULATORY_CARE_PROVIDER_SITE_OTHER): Payer: Self-pay | Admitting: *Deleted

## 2010-11-05 ENCOUNTER — Ambulatory Visit: Payer: Self-pay | Admitting: Cardiology

## 2010-11-05 ENCOUNTER — Encounter: Payer: Self-pay | Admitting: Endocrinology

## 2010-11-06 ENCOUNTER — Ambulatory Visit: Payer: Self-pay | Admitting: Internal Medicine

## 2010-11-08 ENCOUNTER — Ambulatory Visit: Payer: Self-pay | Admitting: Cardiovascular Disease

## 2010-11-08 LAB — CONVERTED CEMR LAB: POC INR: 2.7

## 2010-11-18 ENCOUNTER — Encounter: Payer: Self-pay | Admitting: Endocrinology

## 2010-12-08 HISTORY — PX: CHOLECYSTECTOMY: SHX55

## 2010-12-10 ENCOUNTER — Ambulatory Visit: Admission: RE | Admit: 2010-12-10 | Discharge: 2010-12-10 | Payer: Self-pay | Source: Home / Self Care

## 2010-12-17 ENCOUNTER — Ambulatory Visit
Admission: RE | Admit: 2010-12-17 | Discharge: 2010-12-17 | Payer: Self-pay | Source: Home / Self Care | Attending: Gastroenterology | Admitting: Gastroenterology

## 2010-12-17 ENCOUNTER — Other Ambulatory Visit: Payer: Self-pay | Admitting: Gastroenterology

## 2010-12-17 ENCOUNTER — Encounter: Payer: Self-pay | Admitting: Gastroenterology

## 2010-12-17 DIAGNOSIS — K863 Pseudocyst of pancreas: Secondary | ICD-10-CM

## 2010-12-17 DIAGNOSIS — K859 Acute pancreatitis without necrosis or infection, unspecified: Secondary | ICD-10-CM | POA: Insufficient documentation

## 2010-12-17 DIAGNOSIS — K862 Cyst of pancreas: Secondary | ICD-10-CM | POA: Insufficient documentation

## 2010-12-17 LAB — IBC PANEL
Iron: 45 ug/dL (ref 42–165)
Saturation Ratios: 10.4 % — ABNORMAL LOW (ref 20.0–50.0)
Transferrin: 308.9 mg/dL (ref 212.0–360.0)

## 2010-12-17 LAB — HEPATIC FUNCTION PANEL
ALT: 53 U/L (ref 0–53)
AST: 47 U/L — ABNORMAL HIGH (ref 0–37)
Albumin: 3.4 g/dL — ABNORMAL LOW (ref 3.5–5.2)
Alkaline Phosphatase: 82 U/L (ref 39–117)
Bilirubin, Direct: 0.1 mg/dL (ref 0.0–0.3)
Total Bilirubin: 0.3 mg/dL (ref 0.3–1.2)
Total Protein: 7.3 g/dL (ref 6.0–8.3)

## 2010-12-17 LAB — BASIC METABOLIC PANEL
BUN: 35 mg/dL — ABNORMAL HIGH (ref 6–23)
CO2: 26 mEq/L (ref 19–32)
Calcium: 9.3 mg/dL (ref 8.4–10.5)
Chloride: 102 mEq/L (ref 96–112)
Creatinine, Ser: 1.7 mg/dL — ABNORMAL HIGH (ref 0.4–1.5)
GFR: 42.28 mL/min — ABNORMAL LOW (ref >60.00)
Glucose, Bld: 254 mg/dL — ABNORMAL HIGH (ref 70–99)
Potassium: 4.8 mEq/L (ref 3.5–5.1)
Sodium: 139 mEq/L (ref 135–145)

## 2010-12-17 LAB — LIPASE: Lipase: 59 U/L (ref 11.0–59.0)

## 2010-12-17 LAB — FOLATE: Folate: >20 ng/mL

## 2010-12-17 LAB — VITAMIN B12: Vitamin B-12: 245 pg/mL (ref 211–911)

## 2010-12-17 LAB — MAGNESIUM: Magnesium: 1.8 mg/dL (ref 1.5–2.5)

## 2010-12-17 LAB — AMYLASE: Amylase: 93 U/L (ref 27–131)

## 2010-12-17 LAB — FERRITIN: Ferritin: 173.9 ng/mL (ref 22.0–322.0)

## 2010-12-18 DIAGNOSIS — E538 Deficiency of other specified B group vitamins: Secondary | ICD-10-CM | POA: Insufficient documentation

## 2010-12-19 ENCOUNTER — Ambulatory Visit
Admission: RE | Admit: 2010-12-19 | Discharge: 2010-12-19 | Payer: Self-pay | Source: Home / Self Care | Attending: Gastroenterology | Admitting: Gastroenterology

## 2010-12-26 ENCOUNTER — Ambulatory Visit
Admission: RE | Admit: 2010-12-26 | Discharge: 2010-12-26 | Payer: Self-pay | Source: Home / Self Care | Attending: Gastroenterology | Admitting: Gastroenterology

## 2011-01-01 ENCOUNTER — Ambulatory Visit
Admission: RE | Admit: 2011-01-01 | Discharge: 2011-01-01 | Payer: Self-pay | Source: Home / Self Care | Attending: Endocrinology | Admitting: Endocrinology

## 2011-01-01 ENCOUNTER — Other Ambulatory Visit: Payer: Self-pay | Admitting: Gastroenterology

## 2011-01-01 DIAGNOSIS — K862 Cyst of pancreas: Secondary | ICD-10-CM

## 2011-01-02 ENCOUNTER — Ambulatory Visit
Admission: RE | Admit: 2011-01-02 | Discharge: 2011-01-02 | Payer: Self-pay | Source: Home / Self Care | Attending: Gastroenterology | Admitting: Gastroenterology

## 2011-01-03 ENCOUNTER — Telehealth (INDEPENDENT_AMBULATORY_CARE_PROVIDER_SITE_OTHER): Payer: Self-pay | Admitting: *Deleted

## 2011-01-05 LAB — CONVERTED CEMR LAB
ALT: 76 units/L — ABNORMAL HIGH (ref 0–53)
AST: 69 units/L — ABNORMAL HIGH (ref 0–37)
Albumin: 3.1 g/dL — ABNORMAL LOW (ref 3.5–5.2)
Alkaline Phosphatase: 91 units/L (ref 39–117)
BUN: 23 mg/dL (ref 6–23)
BUN: 25 mg/dL — ABNORMAL HIGH (ref 6–23)
Basophils Relative: 0.4 % (ref 0.0–1.0)
Bilirubin, Direct: 0.1 mg/dL (ref 0.0–0.3)
CO2: 22 meq/L (ref 19–32)
CO2: 28 meq/L (ref 19–32)
Calcium: 9.6 mg/dL (ref 8.4–10.5)
Chloride: 106 meq/L (ref 96–112)
Creatinine,U: 105.6 mg/dL
Crystals: NEGATIVE
GFR calc Af Amer: 71 mL/min
GFR calc non Af Amer: 43 mL/min
GFR calc non Af Amer: 43 mL/min (ref >60)
Glucose, Bld: 146 mg/dL — ABNORMAL HIGH (ref 70–99)
Glucose, Bld: 209 mg/dL — ABNORMAL HIGH (ref 70–99)
Glucose, Bld: 235 mg/dL — ABNORMAL HIGH (ref 70–99)
HCT: 36.7 % — ABNORMAL LOW (ref 39.0–52.0)
MCHC: 33.7 g/dL (ref 30.0–36.0)
MCV: 88 fL (ref 78.0–100.0)
Microalb Creat Ratio: 123.1 mg/g — ABNORMAL HIGH (ref 0.0–30.0)
Neutro Abs: 8.9 10*3/uL — ABNORMAL HIGH (ref 1.4–7.7)
Neutrophils Relative %: 69.6 % (ref 43.0–77.0)
Nitrite: NEGATIVE
PSA: 3.13 ng/mL (ref 0.10–4.00)
Platelets: 381 10*3/uL (ref 150–400)
Potassium: 4.4 meq/L (ref 3.5–5.1)
Potassium: 5.9 meq/L — ABNORMAL HIGH (ref 3.5–5.1)
Pro B Natriuretic peptide (BNP): 144 pg/mL — ABNORMAL HIGH (ref 0.0–100.0)
RDW: 16.2 % — ABNORMAL HIGH (ref 11.5–14.6)
Sodium: 141 meq/L (ref 135–145)
Total Bilirubin: 0.6 mg/dL (ref 0.3–1.2)
Total CHOL/HDL Ratio: 25.5
Total Protein, Urine: 30 mg/dL — AB
Urine Glucose: NEGATIVE mg/dL
Urobilinogen, UA: 0.2 (ref 0.0–1.0)
VLDL: 40 mg/dL (ref 0–40)
WBC: 13 10*3/uL — ABNORMAL HIGH (ref 4.5–10.5)

## 2011-01-07 NOTE — Assessment & Plan Note (Signed)
Summary: rov for osa   Copy to:  Romero Belling Primary Provider/Referring Provider:  Romero Belling, MD  CC:  Follow up for sinus and osa.  states sinuses are doing "much better now."  states he is wearing cpap everynight for approx 7hours each night but states he does wake up a couple of time each night.  denies problems with mask and pressure.  Marland Kitchen  History of Present Illness: the pt comes in today for f/u of his osa.  He has finally gotten his sinuses cleared up, and is back to wearing his cpap compliantly.  Despite using, he still doesn't feel he is sleeping any better, or any more alert.  I have reviewed his chart, and we still have not optimized his pressure for him due to compliance issues related to ongoing chronic sinusitis.  Current Medications (verified): 1)  Aspirin 325 Mg  Tbec (Aspirin) .... Take 1 By Mouth Qd 2)  Omeprazole 20 Mg  Cpdr (Omeprazole) .... Take 1 By Mouth Qd 3)  Zocor 80 Mg  Tabs (Simvastatin) .... Qhs 4)  Glucose Test Strips, Any Brand, and Lancets .... Qid 250.01 5)  Levothyroxine Sodium 50 Mcg  Tabs (Levothyroxine Sodium) .... Take 1 By Mouth Qd 6)  Lumigan 0.03 %  Soln (Bimatoprost) .... Use 1 Drop in Each Eye Qhs 7)  Fludrocortisone Acetate 0.1 Mg Tabs (Fludrocortisone Acetate) .Marland Kitchen.. 1 Tab Three Times Weekly (M,w,f) 8)  Dorzolamide Hcl 2 % Soln (Dorzolamide Hcl) .Marland Kitchen.. 1 Drop To Each Eye Two Times A Day 9)  Fish Oil 1000 Mg Caps (Omega-3 Fatty Acids) .Marland Kitchen.. 1 Once Daily 10)  Warfarin Sodium 2 Mg Tabs (Warfarin Sodium) .... Use As Directed By Anticoagualtion Clinic 11)  Fenofibrate Micronized 200 Mg Caps (Fenofibrate Micronized) .... Take 1 Tablet By Mouth Once A Day 12)  Vitamin D 400 Unit Caps (Cholecalciferol) .... Take 1 Tablet By Mouth Once A Day 13)  Vitamin C 500 Mg Tabs (Ascorbic Acid) .... Take 1 Tablet By Mouth Once A Day 14)  Furosemide 20 Mg Tabs (Furosemide) .... Take 1 Tablet By Mouth Once A Day 15)  Humulin R U-500 (Concentrated) 500 Unit/ml Soln  (Insulin Regular Human) .... Three Times A Day (Just Before Each Meal) 150-175-175-150 Units 16)  Creon 24000 Unit Cpep (Pancrelipase (Lip-Prot-Amyl)) .... 2 Tabs Three Times A Day With Meals.  Allergies (verified): 1)  ! * Actos  Review of Systems      See HPI  Vital Signs:  Patient profile:   70 year old male Height:      70.5 inches Weight:      311.13 pounds BMI:     44.17 O2 Sat:      97 % on Room air Temp:     98.1 degrees F oral Pulse rate:   52 / minute BP sitting:   148 / 64  (left arm) Cuff size:   large  Vitals Entered By: Gweneth Dimitri RN (February 12, 2010 11:30 AM)  O2 Flow:  Room air CC: Follow up for sinus and osa.  states sinuses are doing "much better now."  states he is wearing cpap everynight for approx 7hours each night but states he does wake up a couple of time each night.  denies problems with mask and pressure.   Comments Medications reviewed with patient Daytime contact number verified with patient. Gweneth Dimitri RN  February 12, 2010 11:31 AM    Physical Exam  General:  morbidly obese male in nad Nose:  no skin breakdown or pressure necrosis from cpap mask Neurologic:  alert, but appears a little sleepy. moves all 4.   Impression & Recommendations:  Problem # 1:  OBSTRUCTIVE SLEEP APNEA (ICD-327.23) the pt is back on his cpap after a longstanding issue with chronic sinusitis.  He is not seeing an improvement in his symptoms, and I suspect this is due to the fact we have yet to optimize his pressure.  Would like to place on auto mode for a few weeks, then set his machine on his optimal fixed pressure.  I have also asked him to work hard on weight loss.  Other Orders: Est. Patient Level III (16109) DME Referral (DME)  Patient Instructions: 1)  will recheck pressure on auto mode for 2-3 weeks to optimize your pressure.  Will contact with results. 2)  work on weight loss 3)  followup with me in 6mos.

## 2011-01-07 NOTE — Assessment & Plan Note (Signed)
Summary: rov for osa   Visit Type:  Follow-up Copy to:  Dr Teressa Lower Primary Provider/Referring Provider:  Romero Belling, MD  CC:  6 month follow up. pt states uses cpap 7/7 nights x 6-8 hrs a night. pt states he has no problems with machine or mask and feels pressure is set fine. Marland Kitchen  History of Present Illness: the pt comes in today for f/u of his osa.  He is wearing cpap compliantly, and has no issues with the mask or pressure.  He is doing better with the device now that his sinuses have improved.  We had asked his dme for a download off his machine after his last visit 6mos ago, but no one ever called him to get this from [facility].  At least he has been getting optimal pressure on auto mode.  Current Medications (verified): 1)  Aspirin 325 Mg  Tbec (Aspirin) .... Take 1 By Mouth Qd 2)  Omeprazole 20 Mg  Cpdr (Omeprazole) .... Take 1 By Mouth Qd 3)  Zocor 80 Mg  Tabs (Simvastatin) .... Qhs 4)  Levothyroxine Sodium 50 Mcg  Tabs (Levothyroxine Sodium) .... Take 1 By Mouth Qd 5)  Lumigan 0.03 %  Soln (Bimatoprost) .... Use 1 Drop in Each Eye Qhs 6)  Fludrocortisone Acetate 0.1 Mg Tabs (Fludrocortisone Acetate) .... Take One Tablet By Mouth Once Daily. 7)  Dorzolamide Hcl 2 % Soln (Dorzolamide Hcl) .Marland Kitchen.. 1 Drop To Each Eye Two Times A Day 8)  Fish Oil 1000 Mg Caps (Omega-3 Fatty Acids) .Marland Kitchen.. 1 Once Daily 9)  Warfarin Sodium 2 Mg Tabs (Warfarin Sodium) .... Use As Directed By [facility]tico[facility]tion Clinic 10)  Fenofibrate Micronized 200 Mg Caps (Fenofibrate Micronized) .... Take 1 Tablet By Mouth Once A Day 11)  Vitamin D 400 Unit Caps (Cholecalciferol) .... Take 1 Tablet By Mouth Once A Day 12)  Vitamin C 500 Mg Tabs (Ascorbic Acid) .... Take 1 Tablet By Mouth Once A Day 13)  Furosemide 20 Mg Tabs (Furosemide) .... Take 1 Tablet By Mouth Once A Day 14)  Humulin R U-500 (Concentrated) 500 Unit/ml Soln (Insulin Regular Human) .... Three Times A Day (Just Before Each Meal) 175-175-175-200  Units 15)  Creon 24000 Unit Cpep (Pancrelipase (Lip-Prot-Amyl)) .... 2 Tabs Three Times A Day With Meals. 16)  Onetouch Ultra Test  Strp (Glucose Blood) .... 4x A Day, and Lancets 250.01 17)  Bd Insulin Syringe Ultrafine 30g X 1/2" 1 Ml Misc (Insulin Syringe-Needle U-100) .... 4x A Day For Insulin  Allergies (verified): 1)  ! * Actos  Review of Systems       The patient complains of shortness of breath with activity, productive cough, acid heartburn, indigestion, nasal congestion/difficulty breathing through nose, and hand/feet swelling.  The patient denies shortness of breath at rest, non-productive cough, coughing up blood, chest pain, irregular heartbeats, loss of appetite, weight change, abdominal pain, difficulty swallowing, sore throat, tooth/dental problems, headaches, sneezing, itching, ear ache, anxiety, depression, rash, change in color of mucus, and fever.    Vital Signs:  Patient profile:   70 year old male Height:      70 inches Weight:      307.13 pounds BMI:     44.23 O2 Sat:      95 % on Room air Temp:     98.4 degrees F oral Pulse rate:   61 / minute BP sitting:   122 / 60  (left arm) Cuff size:   large  Vitals Entered By:  Carver Fila (August 27, 2010 10:28 AM)  O2 Flow:  Room air CC: 6 month follow up. pt states uses cpap 7/7 nights x 6-8 hrs a night. pt states he has no problems with machine or mask and feels pressure is set fine.  Comments meds and allergies updated Phone number updated Carver Fila  August 27, 2010 10:31 AM    Physical Exam  General:  obese male in nad Nose:  no skin breakdown or pressure necrosis from cpap mask Extremities:  1+ edema bilat, no cyanosis  Neurologic:  alert and oriented, moves all 4. does not appear sleepy.   Impression & Recommendations:  Problem # 1:  OBSTRUCTIVE SLEEP APNEA (ICD-327.23) the pt is doing well with cpap, and feels that he rests fairly well with adequate daytime alertness.  He is currently on auto  mode, but I would like to find out his best single pressure, and convert back to standard cpap.  I have also asked him to work aggressively on weight loss.  Other Orders: Est. Patient Level III (60454) DME Referral (DME)  Patient Instructions: 1)  will get download off current machine, and will call you with your optimal pressure. 2)  work on weight loss 3)  followup with me in one year, or sooner if having issues with cpap.   Immunization History:  Influenza Immunization History:    Influenza:  historical (08/17/2010)    Appended Document: rov for osa we have received pt cpap download and put into your very important look at folder  Appended Document: rov for osa will review and call pt.

## 2011-01-07 NOTE — Progress Notes (Signed)
Summary: chest pains  Phone Note Call from Patient   Caller: Patient Call For: clance Summary of Call: pt having chest pain when she cough need to see dr clance asap Initial call taken by: Rickard Patience,  December 26, 2009 8:48 AM  Follow-up for Phone Call        called, spoke with pt.  Pt c/o sharp right side chest pain when coughing, states this has gotten worse over the past few days, and requesting to see Dr. Shelle Iron.  OV scheduled with KC today at 10:30.  Pt aware of appt date/time and is ok with this.   Follow-up by: Gweneth Dimitri RN,  December 26, 2009 9:00 AM

## 2011-01-07 NOTE — Assessment & Plan Note (Signed)
Summary: 3 MTH FU---STC   Vital Signs:  Patient profile:   70 year old male Height:      70 inches (177.80 cm) Weight:      302 pounds (137.27 kg) BMI:     43.49 O2 Sat:      96 % on Room air Temp:     98.6 degrees F (37.00 degrees C) oral Pulse rate:   60 / minute BP sitting:   120 / 62  (left arm) Cuff size:   large  Vitals Entered By: Brenton Grills MA (September 03, 2010 1:14 PM)  O2 Flow:  Room air CC: 1 month F/U/aj Is Patient Diabetic? Yes   Referring Provider:  Dr Teressa Lower Primary Provider:  Romero Belling, MD  CC:  1 month F/U/aj.  History of Present Illness: the status of at least 3 ongoing medical problems is addressed today: simvastatin can cause drug-drug interaction.  pt states he feels well in general. elev lft have been noted:  denies abd pain. dm:  he brings a record of his cbg's which i have reviewed today.  he had 1 episode of mild hypoglycemia--it happend in the afternoon.  cbg's vary from 100-200, with no trend throughout the day.  denies weight change  Current Medications (verified): 1)  Aspirin 325 Mg  Tbec (Aspirin) .... Take 1 By Mouth Qd 2)  Omeprazole 20 Mg  Cpdr (Omeprazole) .... Take 1 By Mouth Qd 3)  Zocor 80 Mg  Tabs (Simvastatin) .... Qhs 4)  Levothyroxine Sodium 50 Mcg  Tabs (Levothyroxine Sodium) .... Take 1 By Mouth Qd 5)  Lumigan 0.03 %  Soln (Bimatoprost) .... Use 1 Drop in Each Eye Qhs 6)  Fludrocortisone Acetate 0.1 Mg Tabs (Fludrocortisone Acetate) .... Take One Tablet By Mouth Once Daily. 7)  Dorzolamide Hcl 2 % Soln (Dorzolamide Hcl) .Marland Kitchen.. 1 Drop To Each Eye Two Times A Day 8)  Fish Oil 1000 Mg Caps (Omega-3 Fatty Acids) .Marland Kitchen.. 1 Once Daily 9)  Warfarin Sodium 2 Mg Tabs (Warfarin Sodium) .... Use As Directed By Anticoagualtion Clinic 10)  Fenofibrate Micronized 200 Mg Caps (Fenofibrate Micronized) .... Take 1 Tablet By Mouth Once A Day 11)  Vitamin D 400 Unit Caps (Cholecalciferol) .... Take 1 Tablet By Mouth Once A Day 12)  Vitamin C  500 Mg Tabs (Ascorbic Acid) .... Take 1 Tablet By Mouth Once A Day 13)  Furosemide 20 Mg Tabs (Furosemide) .... Take 1 Tablet By Mouth Once A Day 14)  Humulin R U-500 (Concentrated) 500 Unit/ml Soln (Insulin Regular Human) .... Three Times A Day (Just Before Each Meal) 175-175-175-200 Units 15)  Creon 24000 Unit Cpep (Pancrelipase (Lip-Prot-Amyl)) .... 2 Tabs Three Times A Day With Meals. 16)  Onetouch Ultra Test  Strp (Glucose Blood) .... 4x A Day, and Lancets 250.01 17)  Bd Insulin Syringe Ultrafine 30g X 1/2" 1 Ml Misc (Insulin Syringe-Needle U-100) .... 4x A Day For Insulin  Allergies (verified): 1)  ! * Actos  Past History:  Past Medical History: Last updated: 04/16/2010  1. Severe diabetes.       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 Hypertension.   3. Obstructive sleep apnea using CPAP at night, although this is hard       for him to tolerate.   4. Renal insufficiency.      -baseline Cr 1.5-1.7  5. Hyperlipidemia.   6. Anemia   7. COPD  8. Chronic shortness of breath.        --echo 12/10: EF  60-65% LVH. Mild AS. DIastolic dysfx with elevated LV filling pressures. 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   10.Hypothyroidism.   11. Non-obstructive CAD           --catheterization July 2003: EF of 65%, 40% LAD, 40% RCA            --nuclear 5/09: EF 51% normal 12. 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 Osteoarthritis 14. Anemia 15. Glaucoma  [place]en[place] Disease  Review of Systems  The patient denies syncope, weight loss, and weight gain.    Physical Exam  General:  morbidly obese.  no distress  Pulses:  dorsalis pedis intact bilat.  Extremities:  1+ right pedal edema and 1+ left pedal edema.   no deformity.  no ulcer on the feet.  feet are of normal color and temp.   there are 3 healed surgical scars on the right leg, and mycotic toenails.   Neurologic:  cn 2-12 grossly intact.   readily moves all 4's.   sensation  is intact to touch on the feet  Additional Exam:  Hemoglobin A1C       [H]  9.1 %      AST                  [H]  86 U/L                      0-37 ALT                  [H]  97 U/L                       Impression & Recommendations:  Problem # 1:  DIABETES MELLITUS, TYPE I (ICD-250.01) needs increased rx  Problem # 2:  FATTY LIVER DISEASE (ICD-571.8) Assessment: Unchanged  Problem # 3:  HYPERCHOLESTEROLEMIA (ICD-272.0) he needs a different med.  Medications Added to Medication List This Visit: 1)  Humulin R U-500 (concentrated) 500 Unit/ml Soln (Insulin regular human) .... Three times a day (just before each meal) 200-175-175-175 units 2)  Crestor 40 Mg Tabs (Rosuvastatin calcium) .... 1/2 tab three times weekly (m,w,f).  Other Orders: TLB-Hepatic/Liver Function Pnl (80076-HEPATIC) TLB-A1C / Hgb A1C (Glycohemoglobin) (83036-A1C) Est. Patient Level IV (16109)  Patient Instructions: 1)  Please schedule a follow-up appointment in 4 months. 2)  blood tests are being ordered for you today.  please call [phone] to hear your test results.   3)  pending the test results, please take regular (u-500) insulin to 4x a day (just before each meal), 200-175-175-175 units. 4)  change simvastatin to crestor, 1/2 of 40 mg three times weekly (mon, wed, fri).  5)  (update: i left message on phone-tree:  increase reg insulin to (just before each meal), 225-175-200-175 units. Prescriptions: CRESTOR 40 MG TABS (ROSUVASTATIN CALCIUM) 1/2 tab three times weekly (m,w,f).  #30 x 2   Entered and Authorized by:   Minus Breeding MD   Signed by:   Minus Breeding MD on 09/03/2010   Method used:   Print then Give to Patient   RxID:   [id number]

## 2011-01-07 NOTE — Medication Information (Signed)
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 yes       Details: Started on Cefuroxime 500mg , proair and Guaifensin yesterday.   Recent/future dental: no  Any missed doses?: no       Is patient compliant with meds? yes       Allergies: 1)  ! * Actos  Anticoagulation Management History:      The patient is taking warfarin and comes in today for a routine follow up visit.  Positive risk factors for bleeding include an age of 68 years or older and presence of serious comorbidities.  The bleeding index is 'intermediate risk'.  Positive CHADS2 values include History of CHF, History of HTN, and History of Diabetes.  Negative CHADS2 values include Age > 82 years old.  The start date was 06/26/2008.  His last INR was 1.8 ratio.  Anticoagulation responsible provider: Juanda Chance MD, Smitty Cords.  INR POC: 1.9.  Exp: 09/2011.    Anticoagulation Management Assessment/Plan:      The patient's current anticoagulation dose is Warfarin sodium 2 mg tabs: Use as directed by Anticoagualtion Clinic.  The target INR is 2 - 3.  The next INR is due 10/11/2010.  Anticoagulation instructions were given to patient.  Results were reviewed/authorized by Cloyde Reams, RN, BSN.  He was notified by Cloyde Reams RN.         Prior Anticoagulation Instructions: INR 1.9 Continue on same dosage. You've have been stable on this dose for awhile. We'll check you again in 4 weeks.  Current Anticoagulation Instructions: INR 1.9  Take 3 tablets today, then start taking 2 tablets daily except 3 tablets on Sundays, Tuesdays, and Thursdays.  Recheck in 4 weeks.

## 2011-01-07 NOTE — Assessment & Plan Note (Signed)
Summary: possible icd per dr bensimhon/sl  Medications Added FLUDROCORTISONE ACETATE 0.1 MG TABS (FLUDROCORTISONE ACETATE) Take one tablet by mouth once daily. CLOMIPHENE CITRATE  POWD (CLOMIPHENE CITRATE) once daily      Allergies Added:   Visit Type:  Initial Consult Referring Provider:  Dr Teressa Lower Primary Provider:  Romero Belling, MD   History of Present Illness: [name] is a pleasant 70 year old male with a history of obesity, hypertension, diabetes, diastolic heart failure, and sinus node dysfunction who presents today for EP consultation regarding his symptomatic bradycardia.  He reports having fatigue and decreased exercise tolerance frequently.  He also reports episodes of dizziness.  He notes that he frequently checks his pulse during these sypmtoms and finds his heart rate to be in the 40s.  He is no AV nodal blocking agents.  He previously was evaluated by Dr Teressa Lower and had an event mnitor placed which showed sinus bradycardia w/periods of junctional bradycardia mean HR 58, min HR 43. He denies symptoms of palpitations, chest pain, orthopnea, PND, lower extremity edema,syncope, or neurologic sequela. The patient is tolerating medications without difficulties and is otherwise without complaint today.   Current Medications (verified): 1)  Aspirin 325 Mg  Tbec (Aspirin) .... Take 1 By Mouth Qd 2)  Omeprazole 20 Mg  Cpdr (Omeprazole) .... Take 1 By Mouth Qd 3)  Zocor 80 Mg  Tabs (Simvastatin) .... Qhs 4)  Levothyroxine Sodium 50 Mcg  Tabs (Levothyroxine Sodium) .... Take 1 By Mouth Qd 5)  Lumigan 0.03 %  Soln (Bimatoprost) .... Use 1 Drop in Each Eye Qhs 6)  Fludrocortisone Acetate 0.1 Mg Tabs (Fludrocortisone Acetate) .... Take One Tablet By Mouth Once Daily. 7)  Dorzolamide Hcl 2 % Soln (Dorzolamide Hcl) .Marland Kitchen.. 1 Drop To Each Eye Two Times A Day 8)  Fish Oil 1000 Mg Caps (Omega-3 Fatty Acids) .Marland Kitchen.. 1 Once Daily 9)  Warfarin Sodium 2 Mg Tabs (Warfarin Sodium) .... Use As  Directed By Anticoagualtion Clinic 10)  Fenofibrate Micronized 200 Mg Caps (Fenofibrate Micronized) .... Take 1 Tablet By Mouth Once A Day 11)  Vitamin D 400 Unit Caps (Cholecalciferol) .... Take 1 Tablet By Mouth Once A Day 12)  Vitamin C 500 Mg Tabs (Ascorbic Acid) .... Take 1 Tablet By Mouth Once A Day 13)  Furosemide 20 Mg Tabs (Furosemide) .... Take 1 Tablet By Mouth Once A Day 14)  Humulin R U-500 (Concentrated) 500 Unit/ml Soln (Insulin Regular Human) .... Three Times A Day (Just Before Each Meal) 150-175-175-150 Units 15)  Creon 24000 Unit Cpep (Pancrelipase (Lip-Prot-Amyl)) .... 2 Tabs Three Times A Day With Meals. 16)  Mucinex Dm 30-600 Mg Xr12h-Tab (Dextromethorphan-Guaifenesin) .... 2-3 Daily Seasonal As Needed 17)  Onetouch Ultra Test  Strp (Glucose Blood) .... 4x A Day, and Lancets 250.01 18)  Bd Insulin Syringe Ultrafine 30g X 1/2" 1 Ml Misc (Insulin Syringe-Needle U-100) .... 4x A Day For Insulin 19)  Clomiphene Citrate  Powd (Clomiphene Citrate) .... Once Daily  Allergies (verified): 1)  ! * Actos  Past History:  Past Medical History: Reviewed history from 04/16/2010 and no changes required.  1. Severe diabetes.       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 Hypertension.   3. Obstructive sleep apnea using CPAP at night, although this is hard       for him to tolerate.   4. Renal insufficiency.      -baseline Cr 1.5-1.7  5. Hyperlipidemia.   6. Anemia   7. COPD  8. Chronic  shortness of breath.        --echo 12/10: EF 60-65% LVH. Mild AS. DIastolic dysfx with elevated LV filling pressures. 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   10.Hypothyroidism.   11. Non-obstructive CAD           --catheterization July 2003: EF of 65%, 40% LAD, 40% RCA            --nuclear 5/09: EF 51% normal 12. 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 Osteoarthritis 14. Anemia 15. Glaucoma  Legenaires Disease  Past Surgical  History: Reviewed history from 11/08/2009 and no changes required. 1) right femoral  thrombosis, status post right femoral thromboembolectomy and four-   compartment fasciotomy 2009  deviated septum 1967  Family History: Reviewed history from 04/30/2010 and no changes required. Mother deceased from liver cancer at age 72.   Father  deceased with age 84 from an MI.   He has no brother or sisters.   Social History: Reviewed history from 11/08/2009 and no changes required. He lives in the [place] with his wife.  pt has children.  He is retired from [employer].    He has a 50-pack-year smoke hx,  greater than 2 ppd but quit 2008.   Alcohol, 2-3 beers a week.   Negative for drug use.    .   Review of Systems       All systems are reviewed and negative except as listed in the HPI.   Vital Signs:  Patient profile:   70 year old male Height:      70.5 inches Weight:      311 pounds BMI:     44.15 Pulse rate:   50 / minute BP sitting:   144 / 70  (left arm)  Vitals Entered By: Laurance Flatten CMA (May 30, 2010 11:53 AM)  Physical Exam  General:  morbidly obese, NAD Head:  normocephalic and atraumatic Eyes:  PERRLA/EOM intact; conjunctiva and lids normal. Mouth:  Teeth, gums and palate normal. Oral mucosa normal. Neck:  supple, no JVD Lungs:  Clear bilaterally to auscultation and percussion. Heart:  bradycardic regular rhythm, no mr/g Abdomen:  Bowel sounds positive; abdomen soft and non-tender without masses, organomegaly, or hernias noted. No hepatosplenomegaly. Msk:  Back normal, normal gait. Muscle strength and tone normal. Pulses:  pulses normal in all 4 extremities Extremities:  chronic venous stasis changes with stable/ chronic edema Neurologic:  Alert and oriented x 3. Skin:  Intact without lesions or rashes. Cervical Nodes:  no significant adenopathy Psych:  Normal affect.  Echocardiogram  Procedure date:  11/23/2009  Findings:       - Left  ventricle: The cavity size was normal. There was moderate     concentric hypertrophy. Systolic function was normal. The     estimated ejection fraction was in the range of 60% to 65%.     Doppler parameters are consistent with high ventricular filling     pressure.   - Aortic valve: There was very mild stenosis. Mild regurgitation.   - Mitral valve: Calcified annulus.   - Left atrium: The atrium was mildly dilated.   - Right ventricle: The cavity size was mildly dilated.   - Right atrium: The atrium was mildly dilated.   - Pulmonary arteries: Systolic pressure was moderately to severely     increased.   Transthoracic echocardiography. M-mode, complete 2D, spectral   Doppler, and color Doppler. Height: Height: 180.3cm. Height: 71in.  Weight: Weight: 139.3kg. Weight: 306.4lb. Body mass index: BMI:   42.8kg/m^2. Body surface area: BSA: 2.47m^2. Blood pressure: 120/65.   Patient status: Outpatient. Location: [facility]   EKG  Procedure date:  04/16/2010  Findings:      junctional rhythm 53 bpm, RBBB  Impression & Recommendations:  Problem # 1:  BRADYCARDIA (ICD-427.89) The patient has symptomatic sinus node dysfunction and bradycadia.  He is not on chronotropic agents at this time.  I believe that he would gain symptomatic improvement with pacemaker implantation.  His EF is preserved.   Risks, benefits, alternatives to pacemaker implantation were discussed in detail with the patient today.  He understands that the risks include but are not limited to bleeding, infection, pneumothorax, perforation, tamponade, vascular damage, renal failure, MI, stroke, death,  and lead dislodgement.  He accepts these risks and wishes to proceed.  We will therefore schedule PPM implant at the next available time.  Problem # 2:  DIASTOLIC HEART FAILURE, CHRONIC (ICD-428.32) stable no changes today  Problem # 3:  OBESITY-MORBID (>100') (ICD-278.01) weight loss advised compliance with CPAP  advised  Patient Instructions: 1)  Your physician has recommended that you have a pacemaker inserted.  A pacemaker is a small device that is placed under the skin of your chest or abdomen to help control abnormal heart rhythms. This device uses electrical pulses to prompt the heart to beat at a normal rate. Pacemakers are used to treat heart rhythms that are too slow. Wires (leads) are attached to the pacemaker that goes into the chambers of your heart. This is done in the hospital and usually requires an overnight stay. Please see the instruction sheet given to you today for more information.

## 2011-01-07 NOTE — Miscellaneous (Signed)
Summary: Device preload  Clinical Lists Changes  Observations: Added new observation of PPM INDICATN: Brady (06/18/2010 11:53) Added new observation of MAGNET RTE: BOL 100 ERI  85 (06/18/2010 11:53) Added new observation of PPMLEADSTAT2: active (06/18/2010 11:53) Added new observation of PPMLEADSER2: [id number] (06/18/2010 11:53) Added new observation of PPMLEADMOD2: 1888TC (06/18/2010 11:53) Added new observation of PPMLEADLOC2: RV (06/18/2010 11:53) Added new observation of PPMLEADSTAT1: active (06/18/2010 11:53) Added new observation of PPMLEADSER1: [id number] (06/18/2010 11:53) Added new observation of PPMLEADMOD1: 1888TC (06/18/2010 11:53) Added new observation of PPMLEADLOC1: RA (06/18/2010 11:53) Added new observation of PPM IMP MD: Hillis Range, MD (06/18/2010 11:53) Added new observation of PPMLEADDOI2: 06/11/2010 (06/18/2010 11:53) Added new observation of PPMLEADDOI1: 06/11/2010 (06/18/2010 11:53) Added new observation of PPM DOI: 06/11/2010 (06/18/2010 11:53) Added new observation of PPM SERL#: [id number]  (06/18/2010 11:53) Added new observation of PPM MODL#: [id number]  (06/18/2010 44:01) Added new observation of PACEMAKERMFG: St Jude  (06/18/2010 11:53) Added new observation of PACEMAKER MD: Hillis Range, MD  (06/18/2010 11:53)      PPM Specifications Following MD:  Hillis Range, MD     PPM Vendor:  St Jude     PPM Model Number:  [id number]     PPM Serial Number:  [id number] PPM DOI:  06/11/2010     PPM Implanting MD:  Hillis Range, MD  Lead 1    Location: [facility]     DOI: 06/11/2010     Model #: [id number]     Serial #: [id number]     Status: active Lead 2    Location: [facility]     DOI: 06/11/2010     Model #: [id number]     Serial #: [id number]     Status: active  Magnet Response Rate:  BOL 100 ERI  85  Indications:  Huston Foley

## 2011-01-07 NOTE — Letter (Signed)
Summary: [facility]: Immunoassay Fecal Occult Blood (iFOB) Order [name]  [address] [place], [place] [address]   Phone: [phone]  Fax: [phone]      [facility]: Immunoassay Fecal Occult Blood (iFOB) Order Form   December 25, 2009 MRN: [id number]   [name] October 30, 1941   Physicican Name:_Patterson   Diagnosis Code:_286.9, 564.0     Ashok Cordia RN

## 2011-01-07 NOTE — Letter (Signed)
Summary: [facility]   Imported By: Lester Groveland 04/30/2010 10:34:22  _____________________________________________________________________  External Attachment:    Type:   Image     Comment:   External Document

## 2011-01-07 NOTE — Cardiovascular Report (Signed)
Summary: Office Visit   Office Visit   Imported By: Roderic Ovens 10/25/2010 16:34:36  _____________________________________________________________________  External Attachment:    Type:   Image     Comment:   External Document

## 2011-01-07 NOTE — Progress Notes (Signed)
Summary: A1c  Phone Note Other Incoming   Caller: [facility] Summary of Call: The lab was unable to get enough blood to do pt's A1c and pt did not want to be stuck again. Per Roanna Raider, pt states that he may comeback to redo A1c, please advise Initial call taken by: Brenton Grills MA,  July 30, 2010 11:53 AM

## 2011-01-07 NOTE — Medication Information (Signed)
Summary: [facility]  Anticoagulant Therapy  Managed by: Bethena Midget, RN, BSN PCP: Romero Belling, MD Supervising MD: Riley Kill MD, Maisie Fus Indication 1: Other thrombosis (ICD-453.8) Lab Used: [facility] Site: [facility] PT 19.5 INR POC 1.8 INR RANGE 2 - 3  Dietary changes: no    Health status changes: no    Bleeding/hemorrhagic complications: no    Recent/future hospitalizations: yes       Details: Pending Device implantation on 06/11/10 with Dr Johney Frame  Any changes in medication regimen? no    Recent/future dental: no  Any missed doses?: yes     Details: Sunday only took 4mg  instead of 6mg s  Is patient compliant with meds? yes       Allergies: 1)  ! * Actos  Anticoagulation Management History:      His anticoagulation is being managed by telephone today.  Positive risk factors for bleeding include an age of 68 years or older and presence of serious comorbidities.  The bleeding index is 'intermediate risk'.  Positive CHADS2 values include History of CHF, History of HTN, and History of Diabetes.  Negative CHADS2 values include Age > 44 years old.  The start date was 06/26/2008.  Prothrombin time is 19.5.  Anticoagulation responsible provider: Riley Kill MD, Maisie Fus.  INR POC: 1.8.    Anticoagulation Management Assessment/Plan:      The patient's current anticoagulation dose is Warfarin sodium 2 mg tabs: Use as directed by Anticoagualtion Clinic.  The target INR is 2 - 3.  The next INR is due 06/20/2010.  Anticoagulation instructions were given to patient.  Results were reviewed/authorized by Bethena Midget, RN, BSN.  He was notified by Bethena Midget, RN, BSN.         Prior Anticoagulation Instructions: INR 3.1  Take 1 tablet today then resume same dose of 2 tablets every day except 3 tablets on Sunday and Tuesday  Current Anticoagulation Instructions: INR 1.8 Today 5mg s then resume 4mg s daily except 6mg s on Sundays and Tuesdays. Recheck on 06/20/10

## 2011-01-07 NOTE — Assessment & Plan Note (Signed)
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 He is referred by Dr. Everardo All for possible screening colonoscopy.  Patient denies GI complaints except for some mild constipation. He relates he did Hemoccult cards 2 years ago that were negative. He's had no melena, hematochezia or abdominal pain. His diabetes is controlled on insulin and no oral meds. Chronic anticoagulated and uses CPAP machine. Additionally has osteoarthritis, hypothyroidism, glaucoma, and coronary artery disease. Family history is noncontributory.   GI Review of Systems    Reports acid reflux, chest pain, and  weight gain.      Denies abdominal pain, belching, bloating, dysphagia with liquids, dysphagia with solids, heartburn, loss of appetite, nausea, vomiting, vomiting blood, and  weight loss.      Reports constipation.     Denies anal fissure, black tarry stools, change in bowel habit, diarrhea, diverticulosis, fecal incontinence, heme positive stool, hemorrhoids, irritable bowel syndrome, jaundice, light color stool, liver problems, rectal bleeding, and  rectal pain.    Current Medications (verified): 1)  Aspirin 325 Mg  Tbec (Aspirin) .... Take 1 By Mouth Qd 2)  Omeprazole 20 Mg  Cpdr (Omeprazole) .... Take 1 By Mouth Qd 3)  Zocor 80 Mg  Tabs (Simvastatin) .... Qhs 4)  Glucose Test Strips, Any Brand, and Lancets .... Qid 250.01 5)  Levothyroxine Sodium 50 Mcg  Tabs  (Levothyroxine Sodium) .... Take 1 By Mouth Qd 6)  Lumigan 0.03 %  Soln (Bimatoprost) .... Use 1 Drop in Each Eye Qhs 7)  Fludrocortisone Acetate 0.1 Mg Tabs (Fludrocortisone Acetate) .Marland Kitchen.. 1 Tab Qd 8)  Dorzolamide Hcl 2 % Soln (Dorzolamide Hcl) .Marland Kitchen.. 1 Drop To Each Eye Two Times A Day 9)  Fish Oil 1000 Mg Caps (Omega-3 Fatty Acids) .Marland Kitchen.. 1 Once Daily 10)  Warfarin Sodium 2 Mg Tabs (Warfarin Sodium) .... Use As Directed By Anticoagualtion Clinic 11)  Relion R 100 Unit/ml Soln (Insulin Regular Human) .... Qid (Qac) 210-210-225-50 Units 12)  Fenofibrate Micronized 200 Mg Caps (Fenofibrate Micronized) .... Take 1 Tablet By Mouth Once A Day 13)  Vitamin D 400 Unit Caps (Cholecalciferol) .... Take 1 Tablet By Mouth Once A Day 14)  Vitamin C 500 Mg Tabs (Ascorbic Acid) .... Take 1 Tablet By Mouth Once A Day 15)  Furosemide 20 Mg Tabs (Furosemide) .... Take 1 Tablet By Mouth Once A Day 16)  Proair Hfa 108 (90 Base) Mcg/act  Aers (Albuterol Sulfate) .... 2 Puffs Every 4-6 Hours As Needed 17)  Nasonex 50 Mcg/act  Susp (Mometasone Furoate) .... Two Puffs Each Nostril Daily 18)  Neilmed Sinus Rinses .... Use Three Times A Day 19)  Mucinex Dm .... Take Three Times A Day 20)  Tessalon Perles 100 Mg  Caps (Benzonatate) .... Two By Mouth 3-4 Times Daily If Needed For Cough 21)  Augmentin 875-125 Mg  Tabs (Amoxicillin-Pot Clavulanate) .... By Mouth Twice Daily 22)  Humulin N 100 Unit/ml Susp (Insulin Isophane Human) .... 6 Units Daily 23)  Clomiphene Citrate 50 Mg Tabs (Clomiphene Citrate) .Marland KitchenMarland KitchenMarland Kitchen  1/4 Tab Once Daily 24)  Amoxicillin-Pot Clavulanate 875-125 Mg Tabs (Amoxicillin-Pot Clavulanate) .Marland Kitchen.. 1 By Mouth Two Times A Day X 14 Days  Allergies (verified): 1)  ! * Actos  Past History:  Past medical, surgical, family and social histories (including risk factors) reviewed for relevance to current acute and chronic problems.  Past Medical History:  1. Severe diabetes.       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  Hypertension.   3. Obstructive sleep apnea using CPAP at night, although this is hard       for him to tolerate.   4. Renal insufficiency.      -baseline Cr 1.5-1.7  5. Hyperlipidemia.   6. Anemia   7. COPD  8. Chronic shortness of breath.   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   10.Hypothyroidism.   11. Non-obstructive CAD           --catheterization July 2003: EF of 65%, 40% LAD, 40% RCA            --nuclear 5/09: EF 51% normal 12. 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 Osteoarthritis 14. Anemia 15. Glaucoma  Legenaires Disease  Past Surgical History: Reviewed history from 11/08/2009 and no changes required. 1) right femoral  thrombosis, status post right femoral thromboembolectomy and four-   compartment fasciotomy 2009  deviated septum 1967  Family History: Reviewed history from 11/08/2009 and no changes required. Mother deceased from liver cancer at age 29.   Father  deceased with age 48 from an MI.   He has no brother or sisters.   Social History: Reviewed history from 11/08/2009 and no changes required. He lives in the [place] with his wife.  pt has children.  He is retired from [employer].    He has a 50-pack-year smoke hx,  greater than 2 ppd but quit 2008.   Alcohol, 2-3 beers a week.   Negative for drug use.    .   Review of Systems       The patient complains of allergy/sinus, cough, fatigue, hearing problems, shortness of breath, swelling of feet/legs, and urine leakage.  The patient denies anemia, anxiety-new, arthritis/joint pain, back pain, blood in urine, breast changes/lumps, change in vision, confusion, coughing up blood, depression-new, fainting, fever, headaches-new, heart murmur, heart rhythm changes, itching, menstrual pain, muscle pains/cramps, night sweats, nosebleeds, pregnancy symptoms, skin rash, sleeping problems, sore throat, swollen lymph glands, thirst - excessive , urination -  excessive , urination changes/pain, vision changes, and voice change.         He Currently is on amoxicillin for recurrent bronchitis. He has a dry nonproductive cough and rather marked shortness of breath with exertion. He's on 24 different medications listed and reviewed his record.  Vital Signs:  Patient profile:   70 year old male Height:      71 inches Weight:      301.13 pounds BMI:     42.15 Pulse rate:   68 / minute Pulse rhythm:   regular BP sitting:   110 / 52  (left arm) Cuff size:   large  Vitals Entered By: June McMurray CMA Duncan Dull) (December 25, 2009 1:57 PM)  Physical Exam  General:  Well developed, well nourished, no acute distress.obese.   Head:  Normocephalic and atraumatic. Eyes:  PERRLA, no icterus.exam deferred to patient's ophthalmologist.   Neck:  Supple; no masses or thyromegaly. Lungs:  Clear throughout to auscultation. Heart:  Regular rate and rhythm; no murmurs,  rubs,  or bruits. Abdomen:  Morbid obesity with a very difficult abdominal exam. I cannot appreciate any definite organomegaly, masses or tenderness. Bowel sounds are normal. Rectal:  deferred Msk:  Symmetrical with no gross deformities. Normal posture. Extremities:  1+ pedal edema.  There is chronic edema and stasis dermatitis with deep pigmentation and scaling of the skin in the pretibial areas. Neurologic:  Alert and  oriented x4;  grossly normal neurologically. Cervical Nodes:  No significant cervical adenopathy. Inguinal Nodes:  No significant inguinal adenopathy. Psych:  Alert and cooperative. Normal mood and affect.   Impression & Recommendations:  Problem # 1:  SCREENING COLORECTAL-CANCER (ICD-V76.51) Assessment Unchanged He is a very poor candidate for colonoscopy for his multiple medical problems including insulin-dependent diabetes, sleep apnea, morbid obesity, coronary artery disease, and chronic warfarin use. I will try to schedule him for CT colonoscopy and proceed accordingly. I  Did Order HemeSelect cards to check for occult blood.  Problem # 2:  OBSTRUCTIVE SLEEP APNEA (ICD-327.23) Assessment: Unchanged  Problem # 3:  RENAL INSUFFICIENCY (ICD-588.9) Assessment: Unchanged  Problem # 4:  OBESITY-MORBID (>100') (ICD-278.01) Assessment: Unchanged  Problem # 5:  FATTY LIVER DISEASE (ICD-571.8) Assessment: Unchanged Review of his chart shows abnormal liver function tests with apparently a history of fatty infiltration of his liver. He is not a candidate for bariatric surgery or other interventions for fatty liver disease. See no reason at this time to go to a complicated hepatic workup in view of his guarded prognosis long-term.  Patient Instructions: 1)  Copy sent to : Dr. Everardo All and primary care 2)  Please continue current medications.  3)  We Will Try to schedule CT colonoscopy 4)  Stool cards ordered  Appended Document: NEEDS COLON ON BT--[facility]    Clinical Lists Changes  Orders: Added new Test order of Virtual Colon ([facility]) - Signed

## 2011-01-07 NOTE — Consult Note (Signed)
Summary: Addendum/[facility]   Imported By: Sherian Rein 11/08/2010 12:30:33  _____________________________________________________________________  External Attachment:    Type:   Image     Comment:   External Document

## 2011-01-07 NOTE — Assessment & Plan Note (Signed)
Summary: pc2/per [name]  Medications Added ASPIRIN 81 MG TBEC (ASPIRIN) Take one tablet by mouth daily MUCINEX DM 30-600 MG XR12H-TAB (DEXTROMETHORPHAN-GUAIFENESIN) UAD as needed        Visit Type:  Follow-up Referring Provider:  Dr [name] Primary Provider:  Romero Belling, MD   History of Present Illness: The patient presents today for routine electrophysiology followup. He reports doing very well since having his pacemaker implanted.  He denies procedure related complications.  He feels that his fatigue has improved.  He has occasional SOB.  The patient denies symptoms of palpitations, orthopnea, PND,  dizziness, presyncope, syncope, or neurologic sequela. The patient is tolerating medications without difficulties and is otherwise without complaint today.   Current Medications (verified): 1)  Aspirin 325 Mg  Tbec (Aspirin) .... Take 1 By Mouth Qd 2)  Omeprazole 20 Mg  Cpdr (Omeprazole) .... Take 1 By Mouth Qd 3)  Levothyroxine Sodium 50 Mcg  Tabs (Levothyroxine Sodium) .... Take 1 By Mouth Qd 4)  Lumigan 0.03 %  Soln (Bimatoprost) .... Use 1 Drop in Each Eye Qhs 5)  Fludrocortisone Acetate 0.1 Mg Tabs (Fludrocortisone Acetate) .... Take One Tablet By Mouth Once Daily. 6)  Dorzolamide Hcl 2 % Soln (Dorzolamide Hcl) .Marland Kitchen.. 1 Drop To Each Eye Two Times A Day 7)  Fish Oil 1000 Mg Caps (Omega-3 Fatty Acids) .Marland Kitchen.. 1 Once Daily 8)  Warfarin Sodium 2 Mg Tabs (Warfarin Sodium) .... Use As Directed By Anticoagualtion Clinic 9)  Fenofibrate Micronized 200 Mg Caps (Fenofibrate Micronized) .... Take 1 Tablet By Mouth Once A Day 10)  Vitamin D 400 Unit Caps (Cholecalciferol) .... Take 1 Tablet By Mouth Once A Day 11)  Vitamin C 500 Mg Tabs (Ascorbic Acid) .... Take 1 Tablet By Mouth Once A Day 12)  Furosemide 20 Mg Tabs (Furosemide) .... Take 1 Tablet By Mouth Once A Day 13)  Humulin R U-500 (Concentrated) 500 Unit/ml Soln (Insulin Regular Human) .... Three Times A Day (Just Before Each Meal)  200-175-175-175 Units 14)  Creon 24000 Unit Cpep (Pancrelipase (Lip-Prot-Amyl)) .... 2 Tabs Three Times A Day With Meals. 15)  Onetouch Ultra Test  Strp (Glucose Blood) .... 4x A Day, and Lancets 250.01 16)  Bd Insulin Syringe Ultrafine 30g X 1/2" 1 Ml Misc (Insulin Syringe-Needle U-100) .... 4x A Day For Insulin 17)  Crestor 40 Mg Tabs (Rosuvastatin Calcium) .... 1/2 Tab Three Times Weekly (M,w,f). 18)  Mucinex Dm 30-600 Mg Xr12h-Tab (Dextromethorphan-Guaifenesin) .... Uad As Needed  Allergies: 1)  ! * Actos  Past History:  Past Medical History:  1. Severe diabetes.       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 Hypertension.   3. Obstructive sleep apnea using CPAP at night, although this is hard       for him to tolerate.   4. Renal insufficiency.      -baseline Cr 1.5-1.7  5. Hyperlipidemia.   6. Anemia   7. COPD  8. Chronic shortness of breath.        --echo 12/10: EF 60-65% LVH. Mild AS. DIastolic dysfx with elevated LV filling pressures. 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   10.Hypothyroidism.   11. Non-obstructive CAD           --catheterization July 2003: EF of 65%, 40% LAD, 40% RCA            --nuclear 5/09: EF 51% normal 12. 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 Osteoarthritis 14. 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 Sick sinus syndrome s/p PPM implant 7/11 by [name]  Past Surgical History: 1) right femoral  thrombosis, status post right femoral thromboembolectomy and four-   compartment fasciotomy 2009  deviated septum 1967   Pacemaker implantation.  [employer] Acc[employer] RF DR, model [id number] (serial number [id number])   pacemaker 7/11 by [name]     Vital Signs:  Patient profile:   70 year old male Height:      70 inches Weight:      304 pounds BMI:     43.78 Pulse rate:   65 / minute BP sitting:   132 / 62  (right arm)  Vitals Entered By: [name] CMA (September 26, 2010 1:51 PM)  Physical  Exam  General:  morbidly obese.  no distress  Head:  normocephalic and atraumatic Eyes:  PERRLA/EOM intact; conjunctiva and lids normal. Mouth:  Teeth, gums and palate normal. Oral mucosa normal. Neck:  supple, no JVD Chest Wall:  Pacemaker site is well healed Lungs:  Clear bilaterally to auscultation and percussion. Heart:  RRR, no m/r/g Abdomen:  Bowel sounds positive; abdomen soft and non-tender without masses, organomegaly, or hernias noted. No hepatosplenomegaly. Msk:  Back normal, normal gait. Muscle strength and tone normal. Pulses:  pulses normal in all 4 extremities Extremities:  No clubbing or cyanosis. Neurologic:  Alert and oriented x 3.   PPM Specifications Following MD:  [name] Range, MD     PPM Vendor:  [employer] Jude     PPM Model Number:  [id number]     PPM Serial Number:  [id number] PPM DOI:  06/11/2010     PPM Implanting MD:  [name] Range, MD  Lead 1    Location: RA     DOI: 06/11/2010     Model #: [id number]     Serial #: [id number]     Status: active Lead 2    Location: RV     DOI: 06/11/2010     Model #: 1888TC     Serial #: [id number]     Status: active  Magnet Response Rate:  BOL 100 ERI  85  Indications:  [name]   PPM Follow Up Battery Voltage:  2.99 V     Battery Est. Longevity:  10.3-10.9 yrs     Pacer Dependent:  No       PPM Device Measurements Atrium  Amplitude: 2.8 mV, Impedance: 550 ohms, Threshold: 0.875 V at 0.5 msec Right Ventricle  Amplitude: 8.6 mV, Impedance: 10 ohms, Threshold: 0.75 V at 0.5 msec  Episodes MS Episodes:  23     Percent Mode Switch:  <1%     Coumadin:  Yes Ventricular High Rate:  0     Atrial Pacing:  73%     Ventricular Pacing:  <1%  Parameters Mode:  DDDR     [name] Rate Limit:  60     Upper Rate Limit:  115 Paced AV Delay:  200     Sensed AV Delay:  200 Next Cardiology Appt Due:  06/09/2011 Tech Comments:  23 AMS EPISODES--LONGEST WAS 10 SECONDS. + COUMADIN. NORMAL DEVICE FUNCTION.  NO CHANGES MADE. ROV IN JULY 2012 W/[name]. 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 Take 1 tablet by mouth once a day    Crestor 40 Mg Tabs (Rosuvastatin calcium) .Marland Kitchen... 1/2 tab three times weekly (m,w,f).  Problem # 5:  CORONARY ARTERY DISEASE (ICD-414.00) I have recommended that he stop ASA as he is also on coumadin.  He is reluctant to stop ASA but has agreed to decrease to 81mg  dosing.  Patient Instructions: 1)  Your physician wants you to follow-up in: July with Dr Johney Frame  Bonita Quin will receive a reminder letter in the mail two months in advance. If you don't receive a letter, please call our office to schedule the follow-up appointment. 2)  Your physician has recommended you make the following change in your medication: decrease aspirinto 81mg  daily 3)  Watch salt in diet

## 2011-01-07 NOTE — Consult Note (Signed)
Summary: [place]  [place]   Imported By: Sherian Rein 11/08/2010 12:34:52  _____________________________________________________________________  External Attachment:    Type:   Image     Comment:   External Document

## 2011-01-07 NOTE — Medication Information (Signed)
Summary: Vaccum Erection Device/Pos-T-Vac  Vaccum Erection Device/Pos-T-Vac   Imported By: Lennie Odor 11/06/2010 12:24:03  _____________________________________________________________________  External Attachment:    Type:   Image     Comment:   External Document

## 2011-01-07 NOTE — Assessment & Plan Note (Signed)
Summary: CPX / SECURE HORIZONS / # / CD   Vital Signs:  Patient profile:   70 year old male Height:      70.5 inches (179.07 cm) Weight:      306.25 pounds (139.20 kg) O2 Sat:      98 % on Room air Temp:     98.0 degrees F (36.67 degrees C) oral Pulse rate:   55 / minute BP sitting:   112 / 66  (left arm) Cuff size:   large  Vitals Entered By: Josph Macho RMA (Apr 30, 2010 10:06 AM)  O2 Flow:  Room air CC: Physical/ pt states he is no longer taking Clomiphene/ CF Is Patient Diabetic? Yes   Referring Provider:  Romero Belling Primary Provider:  Romero Belling, MD  CC:  Physical/ pt states he is no longer taking Clomiphene/ CF.  History of Present Illness: here for regular wellness examination.  He's feeling pretty well in general, and does not drink or smoke (quit 2008).   Current Medications (verified): 1)  Aspirin 325 Mg  Tbec (Aspirin) .... Take 1 By Mouth Qd 2)  Omeprazole 20 Mg  Cpdr (Omeprazole) .... Take 1 By Mouth Qd 3)  Zocor 80 Mg  Tabs (Simvastatin) .... Qhs 4)  Glucose Test Strips, Any Brand, and Lancets .... Qid 250.01 5)  Levothyroxine Sodium 50 Mcg  Tabs (Levothyroxine Sodium) .... Take 1 By Mouth Qd 6)  Lumigan 0.03 %  Soln (Bimatoprost) .... Use 1 Drop in Each Eye Qhs 7)  Fludrocortisone Acetate 0.1 Mg Tabs (Fludrocortisone Acetate) .Marland Kitchen.. 1 Tab Three Times Weekly (M,w,f) 8)  Dorzolamide Hcl 2 % Soln (Dorzolamide Hcl) .Marland Kitchen.. 1 Drop To Each Eye Two Times A Day 9)  Fish Oil 1000 Mg Caps (Omega-3 Fatty Acids) .Marland Kitchen.. 1 Once Daily 10)  Warfarin Sodium 2 Mg Tabs (Warfarin Sodium) .... Use As Directed By Anticoagualtion Clinic 11)  Fenofibrate Micronized 200 Mg Caps (Fenofibrate Micronized) .... Take 1 Tablet By Mouth Once A Day 12)  Vitamin D 400 Unit Caps (Cholecalciferol) .... Take 1 Tablet By Mouth Once A Day 13)  Vitamin C 500 Mg Tabs (Ascorbic Acid) .... Take 1 Tablet By Mouth Once A Day 14)  Furosemide 20 Mg Tabs (Furosemide) .... Take 1 Tablet By Mouth Once A  Day 15)  Humulin R U-500 (Concentrated) 500 Unit/ml Soln (Insulin Regular Human) .... Three Times A Day (Just Before Each Meal) 150-175-175-150 Units 16)  Creon 24000 Unit Cpep (Pancrelipase (Lip-Prot-Amyl)) .... 2 Tabs Three Times A Day With Meals. 17)  Clomiphene Citrate 50 Mg Tabs (Clomiphene Citrate) .... 1/4 Tab Once Daily 18)  Mucinex Dm 30-600 Mg Xr12h-Tab (Dextromethorphan-Guaifenesin) .... 2-3 Daily Seasonal As Needed 19)  Amoxicillin 875 Mg Tabs (Amoxicillin) .Marland Kitchen.. 1 Two Times A Day 20)  Acetominophen Cod #3 Tab .... 2 Per Day For Cough  Allergies (verified): 1)  ! * Actos  Family History: Reviewed history from 11/08/2009 and no changes required. Mother deceased from liver cancer at age 3.   Father  deceased with age 65 from an MI.   He has no brother or sisters.   Social History: Reviewed history from 11/08/2009 and no changes required. He lives in the [place] with his wife.  pt has children.  He is retired from [employer].    He has a 50-pack-year smoke hx,  greater than 2 ppd but quit 2008.   Alcohol, 2-3 beers a week.   Negative for drug use.    Marland Kitchen  Review of Systems       The patient complains of weight loss and weight gain.  The patient denies fever, vision loss, chest pain, syncope, headaches, abdominal pain, melena, hematochezia, severe indigestion/heartburn, hematuria, suspicious skin lesions, and depression.         no change in his chronic doe.  he sees dr Ezzard Standing for hearing loss.  he has a chronic cough.  prilosec helps gerd sxs.  Physical Exam  General:  morbidly obese.  no distress  Head:  head: no deformity eyes: no periorbital swelling, no proptosis external nose and ears are normal mouth: no lesion seen Neck:  Supple without thyroid enlargement or tenderness.  Heart:  Regular rate and rhythm without murmurs or gallops noted. Normal S1,S2.   Abdomen:  abdomen is soft, nontender.  no hepatosplenomegaly.   not distended.  no hernia there  is subcutaneously edema of the lower abdomen Rectal:  normal external and internal exam.  heme neg  Genitalia:  Normal external male genitalia with no urethral discharge.  Prostate:  Normal size prostate without masses or tenderness.  Msk:  muscle bulk and strength are grossly normal.  no obvious joint swelling.  gait is normal and steady  Extremities:  no deformity.  no ulcer on the feet.  legs are slightly hyperemic and cyanotic.  1+ right pedal edema and 1+ left pedal edema.  there is a healed surgical scar at the right leg, proximal to the medial malleolus there is cracking of skin and mycotic toenails.   Neurologic:  cn 2-12 grossly intact.   readily moves all 4's.   sensation is intact to touch on the feet  Skin:  normal texture and temp.  no rash.  not diaphoretic  Cervical Nodes:  No significant adenopathy.  Psych:  Alert and cooperative; normal mood and affect; normal attention span and concentration.   Additional Exam:  SEPARATE EVALUATION FOLLOWS--EACH PROBLEM HERE IS NEW, NOT RESPONDING TO TREATMENT, OR POSES SIGNIFICANT RISK TO THE PATIENT'S HEALTH: HISTORY OF THE PRESENT ILLNESS: he brings a record of his cbg's which i have reviewed today.  it varies, but is as low as 80 at any time of day. PAST MEDICAL HISTORY reviewed and up to date today REVIEW OF SYSTEMS: he has mild hypoglycemia, with cbg of 69-71, before any meal.   PHYSICAL EXAMINATION: chest:  clear to auscultation.  no respiratory distress dorsalis pedis intact bilat.  no carotid bruit LAB/XRAY RESULTS: Cholesterol LDL      125.5 mg/dL Hemoglobin U2G       [H]  8.1 %  IMPRESSION: dm, needs increased rx dyslipidemia, needs increased rx PLAN: see instruction page   Impression & Recommendations:  Problem # 1:  ROUTINE GENERAL MEDICAL EXAM@HEALTH  CARE FACL (ICD-V70.0)  Medications Added to Medication List This Visit: 1)  Acetominophen Cod #3 Tab  .... 2 per day for cough 2)  Onetouch Ultra Test Strp  (Glucose blood) .... 4x a day, and lancets 250.01 3)  Bd Insulin Syringe Ultrafine 30g X 1/2" 1 Ml Misc (Insulin syringe-needle u-100) .... 4x a day for insulin  Other Orders: TLB-Lipid Panel (80061-LIPID) TLB-BMP (Basic Metabolic Panel-BMET) (80048-METABOL) TLB-CBC Platelet - w/Differential (85025-CBCD) TLB-Hepatic/Liver Function Pnl (80076-HEPATIC) TLB-TSH (Thyroid Stimulating Hormone) (84443-TSH) TLB-A1C / Hgb A1C (Glycohemoglobin) (83036-A1C) TLB-Microalbumin/Creat Ratio, Urine (82043-MALB) TLB-Udip w/ Micro (81001-URINE) TLB-PSA (Prostate Specific Antigen) (84153-PSA) TLB-Testosterone, Total (84403-TESTO) TLB-BNP (B-Natriuretic Peptide) (83880-BNPR) TLB-IBC Pnl (Iron/FE;Transferrin) (83550-IBC) TLB-B12 + Folate Pnl (25427_06237-S28/BTD) Est. Patient Level III (17616) Est. Patient 65& > (07371)  Patient Instructions: 1)  please consider these measures for your health:  minimize alcohol.  do not use tobacco products.  have a colonoscopy at least every 10 years from age 51.  keep firearms safely stored.  always use seat belts.  have working smoke alarms in your home.  see the dentist regularly.  never drive under the influence of alcohol or drugs (including prescription drugs).  those with fair skin should take precautions against the sun. 2)  please let me know what your wishes would be, if artificial life support measures should become necessary.  it is critically important to prevent falling down (keep floor areas well-lit, dry, and free of loose objects). 3)  blood tests are being ordered for you today.  please call [phone] to hear your test results. 4)  pending the test results, please continue the same medications for now. 5)  Please schedule a follow-up appointment in 3 months. 6)  (update: i left message on phone-tree:  if you take zocor once daily, call so we can change to crestor.  to make your insulin work faster, try dividing each dose into more injections, and also try  warming the injection site.  let me know if you wish to resume clomid.  same florinef). Prescriptions: BD INSULIN SYRINGE ULTRAFINE 30G X 1/2" 1 ML MISC (INSULIN SYRINGE-NEEDLE U-100) 4x a day for insulin  #360 x 3   Entered and Authorized by:   Minus Breeding MD   Signed by:   Minus Breeding MD on 04/30/2010   Method used:   Print then Give to Patient   RxID:   [id number] ONETOUCH ULTRA TEST  STRP (GLUCOSE BLOOD) 4x a day, and lancets 250.01  #360 x 3   Entered and Authorized by:   Minus Breeding MD   Signed by:   Minus Breeding MD on 04/30/2010   Method used:   Print then Give to Patient   RxID:   [id number]

## 2011-01-07 NOTE — Medication Information (Signed)
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 no    Recent/future dental: no  Any missed doses?: no       Is patient compliant with meds? yes       Allergies: 1)  ! * Actos  Anticoagulation Management History:      The patient is taking warfarin and comes in today for a routine follow up visit.  Positive risk factors for bleeding include an age of 68 years or older and presence of serious comorbidities.  The bleeding index is 'intermediate risk'.  Positive CHADS2 values include History of CHF, History of HTN, and History of Diabetes.  Negative CHADS2 values include Age > 68 years old.  The start date was 06/26/2008.  His last INR was 1.8 ratio.  Anticoagulation responsible Rifka Ramey: Excell Seltzer MD, Casimiro Needle.  INR POC: 2.2.  Cuvette Lot#: [id number].  Exp: 09/2011.    Anticoagulation Management Assessment/Plan:      The patient's current anticoagulation dose is Warfarin sodium 2 mg tabs: Use as directed by Anticoagualtion Clinic.  The target INR is 2 - 3.  The next INR is due 08/15/2010.  Anticoagulation instructions were given to patient.  Results were reviewed/authorized by Cloyde Reams, RN, BSN.  He was notified by Cloyde Reams RN.         Prior Anticoagulation Instructions: INR 2.2  Continue same dose of 2 tablets every day except 3 tablets on Sunday and Tuesday.   Current Anticoagulation Instructions: INR 2.2  Continue on same dosage 2 tablets daily except 3 tablets on Sundays and Tuesdays.  Recheck in 4 weeks.

## 2011-01-07 NOTE — Progress Notes (Signed)
Summary: Paperwork needed  Phone Note From Other Clinic   Summary of Call: Pos T vac needs a handwritten rx for "Vacum therapy device for ED" and chart notes for todays date sent to them at Fax [phone]. Patient cannot received the equipment until this is done. Please advise. Initial call taken by: Lucious Groves CMA,  July 30, 2010 3:24 PM  Follow-up for Phone Call        per pt today at OV, pt is no longer interested in receiving device. Spoke with Marcello Moores at [facility] and advised of pt's wishes Follow-up by: Brenton Grills MA,  July 30, 2010 3:38 PM

## 2011-01-07 NOTE — Progress Notes (Signed)
Summary: results   Phone Note Call from Patient Call back at Home Phone [phone]   Caller: Patient Call For: Blayre Papania Reason for Call: Talk to Nurse, Lab or Test Results Summary of Call: Patient would like lab results Initial call taken by: Tawni Levy,  January 14, 2010 9:20 AM  Follow-up for Phone Call        Pt asking about stool for blood.  Notified. Negative. Follow-up by: Ashok Cordia RN,  January 14, 2010 11:24 AM

## 2011-01-07 NOTE — Assessment & Plan Note (Signed)
Summary: 3 MO FU-OYU   Vital Signs:  Patient profile:   70 year old male Height:      70.5 inches (179.07 cm) Weight:      306.50 pounds (139.32 kg) BMI:     43.51 O2 Sat:      96 % on Room air Temp:     97.6 degrees F (36.44 degrees C) oral Pulse rate:   61 / minute BP sitting:   122 / 60  (left arm) Cuff size:   large  Vitals Entered By: Brenton Grills MA (July 30, 2010 10:18 AM)  O2 Flow:  Room air CC: 3 month F/U/pt is no longer taking Mucinex/aj Is Patient Diabetic? Yes   Referring Provider:  Dr Teressa Lower Primary Provider:  Romero Belling, MD  CC:  3 month F/U/pt is no longer taking Mucinex/aj.  History of Present Illness: he brings a record of his cbg's which i have reviewed today.  at all times of day, cbg's vary from 90-150, with no pattern.  he has increased breakfast insulin to 175 units.  no hypoglycemic sxs. pt says he wishes to hold-off on clomid.  viagra did not help.   pt says he does not miss zocor.  no chest pain  Current Medications (verified): 1)  Aspirin 325 Mg  Tbec (Aspirin) .... Take 1 By Mouth Qd 2)  Omeprazole 20 Mg  Cpdr (Omeprazole) .... Take 1 By Mouth Qd 3)  Zocor 80 Mg  Tabs (Simvastatin) .... Qhs 4)  Levothyroxine Sodium 50 Mcg  Tabs (Levothyroxine Sodium) .... Take 1 By Mouth Qd 5)  Lumigan 0.03 %  Soln (Bimatoprost) .... Use 1 Drop in Each Eye Qhs 6)  Fludrocortisone Acetate 0.1 Mg Tabs (Fludrocortisone Acetate) .... Take One Tablet By Mouth Once Daily. 7)  Dorzolamide Hcl 2 % Soln (Dorzolamide Hcl) .Marland Kitchen.. 1 Drop To Each Eye Two Times A Day 8)  Fish Oil 1000 Mg Caps (Omega-3 Fatty Acids) .Marland Kitchen.. 1 Once Daily 9)  Warfarin Sodium 2 Mg Tabs (Warfarin Sodium) .... Use As Directed By Anticoagualtion Clinic 10)  Fenofibrate Micronized 200 Mg Caps (Fenofibrate Micronized) .... Take 1 Tablet By Mouth Once A Day 11)  Vitamin D 400 Unit Caps (Cholecalciferol) .... Take 1 Tablet By Mouth Once A Day 12)  Vitamin C 500 Mg Tabs (Ascorbic Acid) .... Take 1  Tablet By Mouth Once A Day 13)  Furosemide 20 Mg Tabs (Furosemide) .... Take 1 Tablet By Mouth Once A Day 14)  Humulin R U-500 (Concentrated) 500 Unit/ml Soln (Insulin Regular Human) .... Three Times A Day (Just Before Each Meal) 150-175-175-150 Units 15)  Creon 24000 Unit Cpep (Pancrelipase (Lip-Prot-Amyl)) .... 2 Tabs Three Times A Day With Meals. 16)  Mucinex Dm 30-600 Mg Xr12h-Tab (Dextromethorphan-Guaifenesin) .... 2-3 Daily Seasonal As Needed 17)  Onetouch Ultra Test  Strp (Glucose Blood) .... 4x A Day, and Lancets 250.01 18)  Bd Insulin Syringe Ultrafine 30g X 1/2" 1 Ml Misc (Insulin Syringe-Needle U-100) .... 4x A Day For Insulin  Allergies (verified): 1)  ! * Actos  Past History:  Past Medical History: Last updated: 04/16/2010  1. Severe diabetes.       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 Hypertension.   3. Obstructive sleep apnea using CPAP at night, although this is hard       for him to tolerate.   4. Renal insufficiency.      -baseline Cr 1.5-1.7  5. Hyperlipidemia.   6. Anemia   7. COPD  8. Chronic  shortness of breath.        --echo 12/10: EF 60-65% LVH. Mild AS. DIastolic dysfx with elevated LV filling pressures. 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   10.Hypothyroidism.   11. Non-obstructive CAD           --catheterization July 2003: EF of 65%, 40% LAD, 40% RCA            --nuclear 5/09: EF 51% normal 12. 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 Osteoarthritis 14. 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    Physical Exam  General:  obese.  no distress  Extremities:  1+ right pedal edema and 1+ left pedal edema.     Impression & Recommendations:  Problem # 1:  DIABETES MELLITUS, TYPE I (ICD-250.01) apparently well-controlled  Problem # 2:  EDEMA (ICD-782.3) we'll try to balance the lasix, edema, and need for florinef as best  we can  Problem # 3:  FATTY LIVER DISEASE (ICD-571.8) Assessment: Deteriorated although the elev lft are due to this and not the zocor, he needs to stop the zocor for now.  Problem # 4:  HYPERCHOLESTEROLEMIA (ICD-272.0) Assessment: Improved improved, but not at goal  Problem # 5:  HYPOGONADISM (ICD-257.2) rx declined  Medications Added to Medication List This Visit: 1)  Humulin R U-500 (concentrated) 500 Unit/ml Soln (Insulin regular human) .... Three times a day (just before each meal) 175-175-175-150 units  Other Orders: TLB-Lipid Panel (80061-LIPID) TLB-Hepatic/Liver Function Pnl (80076-HEPATIC) Est. Patient Level IV (63875)  Patient Instructions: 1)  Please schedule a follow-up appointment in 3 months. 2)  (update: i left message on phone-tree:  if you take zocor once daily, call so we can change to crestor.  to make your insulin work faster, try dividing each dose into more injections, and also try warming the injection site.  let me know if you wish to resume clomid.  same florinef). 3)  blood tests are being ordered for you today.  please call [phone] to hear your test results. 4)  pending the test results, please continue the same insulin for now. 5)  if your cholesterol today is high, we'll change simvastatin to crestor, 20 mg once daily. 6)  (update: i left message on phone-tree:  although the elev lft are due to nash and not zocor, you need to stop the zocor for now.  ret 1 month).

## 2011-01-07 NOTE — Medication Information (Signed)
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 yes       Details: taking mucinex for congestion   Recent/future dental: no  Any missed doses?: no       Is patient compliant with meds? yes       Allergies: 1)  ! * Actos  Anticoagulation Management History:      The patient is taking warfarin and comes in today for a routine follow up visit.  Positive risk factors for bleeding include an age of 68 years or older and presence of serious comorbidities.  The bleeding index is 'intermediate risk'.  Positive CHADS2 values include History of HTN and History of Diabetes.  Negative CHADS2 values include Age > 15 years old.  The start date was 06/26/2008.  Anticoagulation responsible provider: Eden Emms MD, Theron Arista.  INR POC: 2.0.  Cuvette Lot#: [id number].  Exp: 07/2011.    Anticoagulation Management Assessment/Plan:      The patient's current anticoagulation dose is Warfarin sodium 2 mg tabs: Use as directed by Anticoagualtion Clinic.  The target INR is 2 - 3.  The next INR is due 05/16/2010.  Anticoagulation instructions were given to patient.  Results were reviewed/authorized by Weston Brass, PharmD.  He was notified by Weston Brass PharmD.         Prior Anticoagulation Instructions: INR 2.2. Take 2 tablets daily except 3 tablets on Tues and Sun  Current Anticoagulation Instructions: INR 2.0  Continue same dose of 2 tablets every day except 3 tablets on Sunday and Tuesday

## 2011-01-07 NOTE — Progress Notes (Signed)
Summary: INR results   Phone Note Call from Patient Call back at Home Phone [phone]   Caller: Patient Summary of Call: PT want INR results Initial call taken by: Judie Grieve,  June 05, 2010 10:19 AM  Follow-up for Phone Call        See [facility] Follow-up by: Bethena Midget, RN, BSN,  June 05, 2010 11:16 AM

## 2011-01-07 NOTE — Progress Notes (Signed)
Summary: Virtual Colonoscopy needs prior aurth   Phone Note From Other Clinic   Summary of Call: Selena Batten form [employer].  Pt's virtual colonoscopy will need prior aurthorization thru pt's insurance.  Per Selena Batten they can not do this and will need our office to handle this.  CPT code is [id number].  Order sent to Dwan Bolt for precert. Initial call taken by: Ashok Cordia RN,  January 22, 2010 12:10 PM

## 2011-01-07 NOTE — Cardiovascular Report (Signed)
Summary: Pre Op Orders   Pre Op Orders   Imported By: Roderic Ovens 06/14/2010 11:21:40  _____________________________________________________________________  External Attachment:    Type:   Image     Comment:   External Document

## 2011-01-07 NOTE — Letter (Signed)
Summary: Implantable Device Instructions  Architectural technologist, Main Office  [address]   [place], [place] [address]   Phone: [phone]  Fax: [phone]      Implantable Device Instructions  You are scheduled for:  _____ Permanent Transvenous Pacemaker   on 06/11/2010 with Dr. Johney Frame.  1.  Please arrive at the Short Stay Center at [facility] at 10:30am on the day of your procedure.  2.  Do not eat or drink the night before your procedure.  3.  Complete lab work on 06/04/2010 at 11;00am.   You do not have to be fasting.  4.  Do NOT take these medications for the am of your  procedure:  Furosemide and Insulin.    5.  Plan for an overnight stay.  Bring your insurance cards and a list of your medications.  6.  Wash your chest and neck with antibacterial soap (any brand) the evening before and the morning of your procedure.  Rinse well.  7.  Education material received:     Pacemaker _____            *If you have ANY questions after you get home, please call the office [phone]. [name]  *Every attempt is made to prevent procedures from being rescheduled.  Due to the nauture of Electrophysiology, rescheduling can happen.  The physician is always aware and directs the staff when this occurs.

## 2011-01-07 NOTE — Medication Information (Signed)
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 yes       Details: Started Creon.  Recent/future dental: no  Any missed doses?: no       Is patient compliant with meds? yes       Allergies: 1)  ! * Actos  Anticoagulation Management History:      The patient is taking warfarin and comes in today for a routine follow up visit.  Positive risk factors for bleeding include an age of 68 years or older and presence of serious comorbidities.  The bleeding index is 'intermediate risk'.  Positive CHADS2 values include History of HTN and History of Diabetes.  Negative CHADS2 values include Age > 61 years old.  The start date was 06/26/2008.  Anticoagulation responsible provider: Excell Seltzer MD, Casimiro Needle.  INR POC: 2.2.  Cuvette Lot#: [id number].  Exp: 04/2011.    Anticoagulation Management Assessment/Plan:      The patient's current anticoagulation dose is Warfarin sodium 2 mg tabs: Use as directed by Anticoagualtion Clinic.  The target INR is 2 - 3.  The next INR is due 04/16/2010.  Anticoagulation instructions were given to patient.  Results were reviewed/authorized by Elaina Pattee, PharmD.  He was notified by Elaina Pattee, PharmD.         Prior Anticoagulation Instructions: INR 1.9  Take 3 tablets tomorros (Wednesday).  Then return to normal dosing schedule of 3 tablets on Sunday and Tuesday and 2 tablets all other days.  Return to clinic in 4 weeks.    Current Anticoagulation Instructions: INR 2.2. Take 2 tablets daily except 3 tablets on Tues and Sun

## 2011-01-07 NOTE — Procedures (Signed)
Summary: wound check      Allergies Added:   Current Medications (verified): 1)  Aspirin 325 Mg  Tbec (Aspirin) .... Take 1 By Mouth Qd 2)  Omeprazole 20 Mg  Cpdr (Omeprazole) .... Take 1 By Mouth Qd 3)  Zocor 80 Mg  Tabs (Simvastatin) .... Qhs 4)  Levothyroxine Sodium 50 Mcg  Tabs (Levothyroxine Sodium) .... Take 1 By Mouth Qd 5)  Lumigan 0.03 %  Soln (Bimatoprost) .... Use 1 Drop in Each Eye Qhs 6)  Fludrocortisone Acetate 0.1 Mg Tabs (Fludrocortisone Acetate) .... Take One Tablet By Mouth Once Daily. 7)  Dorzolamide Hcl 2 % Soln (Dorzolamide Hcl) .Marland Kitchen.. 1 Drop To Each Eye Two Times A Day 8)  Fish Oil 1000 Mg Caps (Omega-3 Fatty Acids) .Marland Kitchen.. 1 Once Daily 9)  Warfarin Sodium 2 Mg Tabs (Warfarin Sodium) .... Use As Directed By Anticoagualtion Clinic 10)  Fenofibrate Micronized 200 Mg Caps (Fenofibrate Micronized) .... Take 1 Tablet By Mouth Once A Day 11)  Vitamin D 400 Unit Caps (Cholecalciferol) .... Take 1 Tablet By Mouth Once A Day 12)  Vitamin C 500 Mg Tabs (Ascorbic Acid) .... Take 1 Tablet By Mouth Once A Day 13)  Furosemide 20 Mg Tabs (Furosemide) .... Take 1 Tablet By Mouth Once A Day 14)  Humulin R U-500 (Concentrated) 500 Unit/ml Soln (Insulin Regular Human) .... Three Times A Day (Just Before Each Meal) 150-175-175-150 Units 15)  Creon 24000 Unit Cpep (Pancrelipase (Lip-Prot-Amyl)) .... 2 Tabs Three Times A Day With Meals. 16)  Mucinex Dm 30-600 Mg Xr12h-Tab (Dextromethorphan-Guaifenesin) .... 2-3 Daily Seasonal As Needed 17)  Onetouch Ultra Test  Strp (Glucose Blood) .... 4x A Day, and Lancets 250.01 18)  Bd Insulin Syringe Ultrafine 30g X 1/2" 1 Ml Misc (Insulin Syringe-Needle U-100) .... 4x A Day For Insulin  Allergies (verified): 1)  ! * Actos  PPM Specifications Following MD:  Hillis Range, MD     PPM Vendor:  [employer]     PPM Model Number:  [id number]     PPM Serial Number:  [id number] PPM DOI:  06/11/2010     PPM Implanting MD:  Hillis Range, MD  Lead 1    Location: [facility]      DOI: 06/11/2010     Model #: [id number]     Serial #: [id number]     Status: active Lead 2    Location: [facility]     DOI: 06/11/2010     Model #: [id number]     Serial #: [id number]     Status: active  Magnet Response Rate:  BOL 100 ERI  85  Indications:  Huston Foley   PPM Follow Up Remote Check?  No Battery Voltage:  3.20 V     Battery Est. Longevity:  10.9 years     Pacer Dependent:  No       PPM Device Measurements Atrium  Amplitude: 2.3 mV, Impedance: 460 ohms, Threshold: 0.75 V at 0.5 msec Right Ventricle  Amplitude: 8.6 mV, Impedance: 450 ohms, Threshold: 0.625 V at 0.5 msec  Episodes MS Episodes:  1     Percent Mode Switch:  <1%     Coumadin:  Yes Atrial Pacing:  79%     Ventricular Pacing:  <1%  Parameters Mode:  DDDR     Lower Rate Limit:  60     Upper Rate Limit:  115 Paced AV Delay:  200     Sensed AV Delay:  200 Next Cardiology Appt Due:  09/07/2010 Tech  Comments:  No parameter changes. 1 mode switch episode 6 seconds, + coumadin. Steri strips removed, no redness or edema at the site although he c/o some "swelling and soreness" in his left shoulder.  Mr. Covelli was instructed to call us if this gets worse or last longer than one week, otherwise we will see him back in 3 months with Dr. Johney Frame. Altha Harm, LPN  June 24, 2010 9:27 AM

## 2011-01-07 NOTE — Medication Information (Signed)
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 yes       Details: took steroid dose pack and Augmentin for 2 weeks since last visit.   Recent/future dental: no  Any missed doses?: no       Is patient compliant with meds? yes       Allergies: 1)  ! * Actos  Anticoagulation Management History:      The patient is taking warfarin and comes in today for a routine follow up visit.  Positive risk factors for bleeding include an age of 70 years or older and presence of serious comorbidities.  The bleeding index is 'intermediate risk'.  Positive CHADS2 values include History of CHF, History of HTN, and History of Diabetes.  Negative CHADS2 values include Age > 24 years old.  The start date was 06/26/2008.  Anticoagulation responsible provider: Tenny Craw MD, Gunnar Fusi.  INR POC: 3.1.  Cuvette Lot#: [id number].  Exp: 07/2011.    Anticoagulation Management Assessment/Plan:      The patient's current anticoagulation dose is Warfarin sodium 2 mg tabs: Use as directed by Anticoagualtion Clinic.  The target INR is 2 - 3.  The next INR is due 06/13/2010.  Anticoagulation instructions were given to patient.  Results were reviewed/authorized by Weston Brass, PharmD.  He was notified by Weston Brass PharmD.         Prior Anticoagulation Instructions: INR 2.0  Continue same dose of 2 tablets every day except 3 tablets on Sunday and Tuesday   Current Anticoagulation Instructions: INR 3.1  Take 1 tablet today then resume same dose of 2 tablets every day except 3 tablets on Sunday and Tuesday

## 2011-01-07 NOTE — Progress Notes (Signed)
Summary: F Y I Insulin   Phone Note From Pharmacy   Summary of Call: Pharm called regarding pt's insulin to confirm how pt is to take it. This is not a common strength of insulin and they wanted to make sure the patient understood the directions. They spoke with patient who explained to them he understands that he is to take 1/5th of what he normally would take. Pharm will fill med and pt is to contact office with any further questions.  Initial call taken by: Lamar Sprinkles, CMA,  December 21, 2009 8:39 AM  Follow-up for Phone Call        current insulin rx is correct - pt should take as prescribed per Dr Everardo All Follow-up by: Corwin Levins MD,  December 21, 2009 9:11 AM

## 2011-01-07 NOTE — Progress Notes (Signed)
Summary: wating on download  Phone Note Call from Patient Call back at Home Phone [phone]   Caller: pt Call For: clance Summary of Call: Pt states that he was told to call if he had not heard anything from [facility] about download. Pt states they have yet to come and do download. This was reordered on 08-27-10 at last OV with [facility]. Please advise.  Initial call taken by: Carron Curie CMA,  September 09, 2010 10:24 AM  Follow-up for Phone Call        called and spoke with pt.  pt states [facility] ordered download of his cpap machine 08/27/2010- order was sent to [facility].  Pt states he received a call from [facility] to send in his memory card from his machine but pt states he told them he doesn't have a memory card in his machine and they were supposed to mail him one, which pt states he has never received.  Will forward message to [facility] to address.  Aundra Millet Reynolds LPN  September 09, 2010 10:37 AM   Called and spoke with Ethelle Lyon at [facility]. Advised her of the above message from patient. She stated that [employer] [employer] has the order for the download and has been trying to get up from the patient. Advised that pt hasn't heard from them. to try calling pt today. I will follow back up with the patient later on today to see if this issue has been addressed. Rhonda Cobb  September 09, 2010 2:52 PM Spoke with pt and he stated that [facility] just called him. He stated that they put the memory card in the mail and that he will probably get it in the next day or two. Advised pt that if he had any questions about the instructions on how to load the card to call [facility] and ask for Ethelle Lyon. Also advised pt that anytime we send an order if he does not hear from the DME company in a week to call us, that when we send an order, we expect it to be addressed in a timely manner. Pt stated he would and he would call us if he had any additional problems. Alfonso Ramus  September 09, 2010 5:36 PM

## 2011-01-07 NOTE — Assessment & Plan Note (Signed)
Summary: 2 MTH FU   STC   Vital Signs:  Patient profile:   70 year old male Height:      71 inches (180.34 cm) Weight:      309.13 pounds (140.51 kg) O2 Sat:      97 % on Room air Temp:     97.3 degrees F (36.28 degrees C) oral Pulse rate:   50 / minute BP sitting:   126 / 60  (left arm) Cuff size:   large  Vitals Entered By: Josph Macho RMA (January 29, 2010 10:24 AM)  O2 Flow:  Room air CC: 2 month follow up/ pt switched his Relion to U-500 and doses to 30-35-35-30/ pt states he is no longer taking Proair, Nasonex, Mucinex, Clomiphene, Neilmed, or Hydromet/ CF Is Patient Diabetic? Yes   Referring Provider:  Romero Belling Primary Provider:  Romero Belling, MD  CC:  2 month follow up/ pt switched his Relion to U-500 and doses to 30-35-35-30/ pt states he is no longer taking Proair, Nasonex, Mucinex, Clomiphene, Neilmed, and or Hydromet/ CF.  History of Present Illness: pt says he has had to decrease the # of units since he has been on the u-500 reg insulin.  he takes three times a day (qac) 150-175-175-150 units, and cbg's have improved.  since the reduction in the # of units, he has not had any hypoglycemia.   he denies dysuria. he says he does not have dizziness.  Current Medications (verified): 1)  Aspirin 325 Mg  Tbec (Aspirin) .... Take 1 By Mouth Qd 2)  Omeprazole 20 Mg  Cpdr (Omeprazole) .... Take 1 By Mouth Qd 3)  Zocor 80 Mg  Tabs (Simvastatin) .... Qhs 4)  Glucose Test Strips, Any Brand, and Lancets .... Qid 250.01 5)  Levothyroxine Sodium 50 Mcg  Tabs (Levothyroxine Sodium) .... Take 1 By Mouth Qd 6)  Lumigan 0.03 %  Soln (Bimatoprost) .... Use 1 Drop in Each Eye Qhs 7)  Fludrocortisone Acetate 0.1 Mg Tabs (Fludrocortisone Acetate) .Marland Kitchen.. 1 Tab Qd 8)  Dorzolamide Hcl 2 % Soln (Dorzolamide Hcl) .Marland Kitchen.. 1 Drop To Each Eye Two Times A Day 9)  Fish Oil 1000 Mg Caps (Omega-3 Fatty Acids) .Marland Kitchen.. 1 Once Daily 10)  Warfarin Sodium 2 Mg Tabs (Warfarin Sodium) .... Use As  Directed By Anticoagualtion Clinic 11)  Relion R 100 Unit/ml Soln (Insulin Regular Human) .... Qid (Qac) 210-210-225-50 Units 12)  Fenofibrate Micronized 200 Mg Caps (Fenofibrate Micronized) .... Take 1 Tablet By Mouth Once A Day 13)  Vitamin D 400 Unit Caps (Cholecalciferol) .... Take 1 Tablet By Mouth Once A Day 14)  Vitamin C 500 Mg Tabs (Ascorbic Acid) .... Take 1 Tablet By Mouth Once A Day 15)  Furosemide 20 Mg Tabs (Furosemide) .... Take 1 Tablet By Mouth Once A Day 16)  Proair Hfa 108 (90 Base) Mcg/act  Aers (Albuterol Sulfate) .... 2 Puffs Every 4-6 Hours As Needed 17)  Nasonex 50 Mcg/act  Susp (Mometasone Furoate) .... Two Puffs Each Nostril Daily 18)  Neilmed Sinus Rinses .... Use Two To Three Times A Day 19)  Mucinex Dm .... Take Three Times A Day 20)  Clomiphene Citrate 50 Mg Tabs (Clomiphene Citrate) .... 1/4 Tab Once Daily 21)  Hydromet 5-1.5 Mg/29ml Syrp (Hydrocodone-Homatropine) .Marland Kitchen.. 10cc By Mouth Q6h As Needed Cough.  Allergies (verified): 1)  ! * Actos  Past History:  Past Medical History: Last updated: 12/25/2009  1. Severe diabetes.       -- c/b  neuropathy, nephropathy  2. Hypertension.   3. Obstructive sleep apnea using CPAP at night, although this is hard       for him to tolerate.   4. Renal insufficiency.      -baseline Cr 1.5-1.7  5. Hyperlipidemia.   6. Anemia   7. COPD  8. Chronic shortness of breath.   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   10.Hypothyroidism.   11. Non-obstructive CAD           --catheterization July 2003: EF of 65%, 40% LAD, 40% RCA            --nuclear 5/09: EF 51% normal 12. 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 Osteoarthritis 14. Anemia 15. Glaucoma  [place]en[place] Disease  Review of Systems  The patient denies syncope and dyspnea on exertion.    Physical Exam  Pulses:  dorsalis pedis intact bilat.  Extremities:  no deformity.  no ulcer on the feet.  feet are of normal color and  temp.  1+ right pedal edema and 1+ left pedal edema.   Neurologic:  sensation is intact to touch on the feet, but decreased from normal Additional Exam:  Sodium                    141 mEq/L                   135-145   Potassium                 4.1 mEq/L                   3.5-5.1   Chloride                  107 mEq/L                   96-112   Carbon Dioxide            29 mEq/L                    19-32   Glucose              [L]  58 mg/dL                    16-10   BUN                  [H]  24 mg/dL                    9-60   Creatinine                1.5 mg/dL                   4.5-4.0   Calcium                   9.2 mg/dL       Impression & Recommendations:  Problem # 1:  DIABETES MELLITUS, TYPE I (ICD-250.01) Assessment Improved  Problem # 2:  HYPERKALEMIA (ICD-276.7) well-controlled.  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 1 tab three times weekly (m,w,f) 2)  Humulin R U-500 (concentrated) 500 Unit/ml Soln (Insulin regular human) .... Three times a day (just before each  meal) 150-175-175-150 units  Other Orders: TLB-BMP (Basic Metabolic Panel-BMET) (80048-METABOL) Prescription Created Electronically [id number]) Est. Patient Level IV (29562)  Patient Instructions: 1)  physical in 3  months. 2)  same insulin 3)  tests are being ordered for you today.  a few days after the test(s), please call [phone] to hear your test results. 4)  (update: i left message on phone-tree:  reduce breakfast insulin to 140 units.   reduce florinef to three times weekly.  we'll follow the heart rate) Prescriptions: FLUDROCORTISONE ACETATE 0.1 MG TABS (FLUDROCORTISONE ACETATE) 1 tab three times weekly (m,w,f)  #12 x 11   Entered and Authorized by:   Minus Breeding MD   Signed by:   Minus Breeding MD on 01/30/2010   Method used:    [facility]ron[facility] to        [facility] [phone][facility] (retail)       [address]       [place], [place]  [address]       Ph: [phone]       Fax: [phone]   RxID:   [id number] HUMULIN R U-500 (CONCENTRATED) 500 UNIT/ML SOLN (INSULIN REGULAR HUMAN) three times a day (just before each meal) 150-175-175-150 units  #60 cc x 11   Entered and Authorized by:   Minus Breeding MD   Signed by:   Minus Breeding MD on 01/29/2010   Method used:   [facility] to        [facility]  [address] [phone]* (retail)       [address]       [place], [place]  [address]       Ph: [phone]       Fax: [phone]   RxID:   [id number]

## 2011-01-07 NOTE — Medication Information (Signed)
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 yes       Details: Pt now on Creon, started a couple weeks ago  Recent/future dental: no  Any missed doses?: no       Is patient compliant with meds? yes       Allergies: 1)  ! * Actos  Anticoagulation Management History:      The patient is taking warfarin and comes in today for a routine follow up visit.  Positive risk factors for bleeding include an age of 68 years or older and presence of serious comorbidities.  The bleeding index is 'intermediate risk'.  Positive CHADS2 values include History of HTN and History of Diabetes.  Negative CHADS2 values include Age > 35 years old.  The start date was 06/26/2008.  Anticoagulation responsible provider: Myrtis Ser MD, Tinnie Gens.  INR POC: 1.9.  Cuvette Lot#: [id number].  Exp: 04/2011.    Anticoagulation Management Assessment/Plan:      The patient's current anticoagulation dose is Warfarin sodium 2 mg tabs: Use as directed by Anticoagualtion Clinic.  The target INR is 2 - 3.  The next INR is due 03/19/2010.  Anticoagulation instructions were given to patient.  Results were reviewed/authorized by Eda Keys, PharmD.  He was notified by Eda Keys.         Prior Anticoagulation Instructions: INR 2.1  Continue 3 tabs each Sunday and Tuesday and 2 tabs on all other days.  Recheck in 4 weeks.   Current Anticoagulation Instructions: INR 1.9  Take 3 tablets tomorros (Wednesday).  Then return to normal dosing schedule of 3 tablets on Sunday and Tuesday and 2 tablets all other days.  Return to clinic in 4 weeks.

## 2011-01-07 NOTE — Medication Information (Signed)
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 no    Recent/future dental: no  Any missed doses?: no       Is patient compliant with meds? yes       Allergies: 1)  ! * Actos  Anticoagulation Management History:      The patient is taking warfarin and comes in today for a routine follow up visit.  Positive risk factors for bleeding include an age of 70 years or older and presence of serious comorbidities.  The bleeding index is 'intermediate risk'.  Positive CHADS2 values include History of CHF, History of HTN, and History of Diabetes.  Negative CHADS2 values include Age > 68 years old.  The start date was 06/26/2008.  His last INR was 1.8 ratio.  Anticoagulation responsible Trinitey Roache: Tenny Craw MD,Paula.  INR POC: 1.9.  Cuvette Lot#: [id number].  Exp: 09/2011.    Anticoagulation Management Assessment/Plan:      The patient's current anticoagulation dose is Warfarin sodium 2 mg tabs: Use as directed by Anticoagualtion Clinic.  The target INR is 2 - 3.  The next INR is due 09/13/2010.  Anticoagulation instructions were given to patient.  Results were reviewed/authorized by Cloyde Reams, RN, BSN.         Prior Anticoagulation Instructions: INR 2.2  Continue on same dosage 2 tablets daily except 3 tablets on Sundays and Tuesdays.  Recheck in 4 weeks.    Current Anticoagulation Instructions: INR 1.9 Continue on same dosage. You've have been stable on this dose for awhile. We'll check you again in 4 weeks.

## 2011-01-07 NOTE — Progress Notes (Signed)
Summary: productive cough  Phone Note Call from Patient   Caller: Patient Call For: clance Summary of Call: need prescript for acetaminophen-cod #3 [facility] [address] Initial call taken by: Rickard Patience,  October 01, 2010 1:52 PM  Follow-up for Phone Call        Spoke with pt.  He is requesting a refill on tylenol #3.  He states that [facility] prescribed this a long time ago to help with cough and he has had cough x 1 wk- prod with clear/yellow sptutum.  I advised that [facility] sees him for sleep and that he should check with his PCP for this.  He refused, stating that he should not have to do this being that [facility] gave med in the first place.  I advised that [facility] is out of the office until 10/04/10 and pt states okay to wait until then.  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  But I didn;t use tylenol #3.  would call in omnicef 300mg  2 a day for 5 days, and also tussionex 5 cc q12h as needed with 6 ounces and no fills if he doesn;t improve, needs ov. Additional Follow-up by: Barbaraann Share MD,  October 03, 2010 10:36 AM    Additional Follow-up for Phone Call Additional follow up Details #2::    rx sent and pt advised of recs.Carron Curie CMA  October 03, 2010 10:40 AM   New/Updated Medications: CEFDINIR 300 MG CAPS (CEFDINIR) Take 1 tablet by mouth two times a day x5 days TUSSIONEX PENNKINETIC ER 10-8 MG/5ML LQCR (HYDROCOD POLST-CHLORPHEN POLST) take 5cc every 12 hours as needed Prescriptions: TUSSIONEX PENNKINETIC ER 10-8 MG/5ML LQCR (HYDROCOD POLST-CHLORPHEN POLST) take 5cc every 12 hours as needed  #6 ounces x 0   Entered by:   Carron Curie CMA   Authorized by:   Barbaraann Share MD   Signed by:   Carron Curie CMA on 10/03/2010   Method used:   [facility]ph[facility] to ...       [facility]   [address] [id number]       [address]       [place], [place]  [address]       Ph: [phone]       Fax: [phone]   RxID:   [id number] CEFDINIR 300 MG CAPS (CEFDINIR) Take 1 tablet by mouth two times a day x5 days  #10 x 0   Entered by:   Carron Curie CMA   Authorized by:   Barbaraann Share MD   Signed by:   Carron Curie CMA on 10/03/2010   Method used:   Telephoned to ...       [facility]  [address] [id number] ([facility]ail)       [address]       [place], [place]  [address]       Ph: [phone]       Fax: [phone]   RxID:   [id number]

## 2011-01-07 NOTE — Letter (Signed)
Summary: CMN for CPAP Supplies/HCS  CMN for CPAP Supplies/HCS   Imported By: Sherian Rein 01/03/2010 07:51:15  _____________________________________________________________________  External Attachment:    Type:   Image     Comment:   External Document

## 2011-01-07 NOTE — Progress Notes (Signed)
Summary: speak to nurse  Phone Note Call from Patient Call back at Home Phone [phone]   Caller: Patient Call For: clance Reason for Call: Talk to Nurse Summary of Call: [facility] was supposed to about 2 weeks ago to make an adjustment on his cpap machine, still haven't heard from them, please advise. Initial call taken by: Darletta Moll,  March 04, 2010 10:51 AM  Follow-up for Phone Call        rhonda you sent this order on 02/12/10 and the pt has not heard from the home care company since he saw [facility] on 3/8 for appt, pt would like for someone to check on this and give him a call back   Follow-up by: Philipp Deputy CMA,  March 04, 2010 12:01 PM  Additional Follow-up for Phone Call Additional follow up Details #1::        Called Kim with [employer] and she stated that in Dec. 2010 they took out a loaner Cpap machine to patient which was set on the auto mode. [facility] was waiting for 3/29 which would be 3 wks to obtain download off this machine. Called pt, but no one answered. Left message on pt's voice mail of the above. Advised pt that he should here from [facility] about setting up an appt to download machine this week. If he doesn't hear anything to let me know. Gave pt my phone number. Kim with [employer] stated that she would call him today to arrange a time to collect data. Alfonso Ramus  March 04, 2010 12:50 PM  Additional Follow-up by: Philipp Deputy CMA,  March 04, 2010 12:58 PM

## 2011-01-07 NOTE — Medication Information (Signed)
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 no    Recent/future dental: no  Any missed doses?: no       Is patient compliant with meds? yes       Allergies: 1)  ! * Actos  Anticoagulation Management History:      The patient is taking warfarin and comes in today for a routine follow up visit.  Positive risk factors for bleeding include an age of 70 years or older and presence of serious comorbidities.  The bleeding index is 'intermediate risk'.  Positive CHADS2 values include History of CHF, History of HTN, and History of Diabetes.  Negative CHADS2 values include Age > 33 years old.  The start date was 06/26/2008.  His last INR was 1.8 ratio.  Anticoagulation responsible provider: Graciela Husbands MD, Viviann Spare.  INR POC: 2.2.  Cuvette Lot#: [id number].  Exp: 08/2011.    Anticoagulation Management Assessment/Plan:      The patient's current anticoagulation dose is Warfarin sodium 2 mg tabs: Use as directed by Anticoagualtion Clinic.  The target INR is 2 - 3.  The next INR is due 07/18/2010.  Anticoagulation instructions were given to patient.  Results were reviewed/authorized by Weston Brass, PharmD.  He was notified by Weston Brass PharmD.         Prior Anticoagulation Instructions: INR 1.8 Today 5mg s then resume 4mg s daily except 6mg s on Sundays and Tuesdays. Recheck on 06/20/10  Current Anticoagulation Instructions: INR 2.2  Continue same dose of 2 tablets every day except 3 tablets on Sunday and Tuesday.

## 2011-01-07 NOTE — Medication Information (Signed)
Summary: Pos-T-Vac  Pos-T-Vac   Imported By: Lester [place] 07/12/2010 12:35:31  _____________________________________________________________________  External Attachment:    Type:   Image     Comment:   External Document

## 2011-01-07 NOTE — Medication Information (Signed)
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 yesterday Dx with Pancreatis and UTI  Any changes in medication regimen? yes       Details: Discharged home on Cipro BID for 5 days.   Recent/future dental: no  Any missed doses?: no       Is patient compliant with meds? yes      Comments: Warfarin dose same in hosp. and no Cipro given.   Allergies: 1)  ! * Actos  Anticoagulation Management History:      The patient is taking warfarin and comes in today for a routine follow up visit.  Positive risk factors for bleeding include an age of 70 years or older and presence of serious comorbidities.  The bleeding index is 'intermediate risk'.  Positive CHADS2 values include History of CHF, History of HTN, and History of Diabetes.  Negative CHADS2 values include Age > 68 years old.  The start date was 06/26/2008.  His last INR was 1.8 ratio.  Anticoagulation responsible Kaiea Esselman: Excell Seltzer MD, Casimiro Needle.  INR POC: 2.7.  Cuvette Lot#: [id number].  Exp: 11/2011.    Anticoagulation Management Assessment/Plan:      The patient's current anticoagulation dose is Warfarin sodium 2 mg tabs: Use as directed by Anticoagualtion Clinic.  The target INR is 2 - 3.  The next INR is due 12/10/2010.  Anticoagulation instructions were given to patient.  Results were reviewed/authorized by Bethena Midget, RN, BSN.  He was notified by Bethena Midget, RN, BSN.         Prior Anticoagulation Instructions: INR 2.2  Continue on same dosage 4mg  daily except 6mg  on Sundays, Tuesdays, and Thursdays.  Recheck in 4 weeks.   Current Anticoagulation Instructions: INR  2.7 Continue 4mg s everyday except 6mg s on Tuesdays, Thursdays and Sundays. Recheck in 4 weeks.

## 2011-01-07 NOTE — Medication Information (Signed)
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 yes       Details: Medication list updated.  Recent/future dental: no  Any missed doses?: no       Is patient compliant with meds? yes       Current Medications (verified): 1)  Aspirin 81 Mg Tbec (Aspirin) .... Take One Tablet By Mouth Daily 2)  Omeprazole 20 Mg  Cpdr (Omeprazole) .... Take 1 By Mouth Qd 3)  Levothyroxine Sodium 50 Mcg  Tabs (Levothyroxine Sodium) .... Take 1 By Mouth Qd 4)  Lumigan 0.03 %  Soln (Bimatoprost) .... Use 1 Drop in Each Eye Qhs 5)  Fludrocortisone Acetate 0.1 Mg Tabs (Fludrocortisone Acetate) .... Take One Tablet By Mouth Once Daily. 6)  Dorzolamide Hcl 2 % Soln (Dorzolamide Hcl) .Marland Kitchen.. 1 Drop To Each Eye Two Times A Day 7)  Fish Oil 1000 Mg Caps (Omega-3 Fatty Acids) .Marland Kitchen.. 1 Once Daily 8)  Warfarin Sodium 2 Mg Tabs (Warfarin Sodium) .... Use As Directed By Anticoagualtion Clinic 9)  Fenofibrate Micronized 200 Mg Caps (Fenofibrate Micronized) .... Take 1 Tablet By Mouth Once A Day 10)  Vitamin D 400 Unit Caps (Cholecalciferol) .... Take 1 Tablet By Mouth Once A Day 11)  Vitamin C 500 Mg Tabs (Ascorbic Acid) .... Take 1 Tablet By Mouth Once A Day 12)  Furosemide 20 Mg Tabs (Furosemide) .... Take 1 Tablet By Mouth Once A Day 13)  Humulin R U-500 (Concentrated) 500 Unit/ml Soln (Insulin Regular Human) .... Three Times A Day (Just Before Each Meal) 200-175-175-175 Units 14)  Zenpep 25000 Unit Cpep (Pancrelipase (Lip-Prot-Amyl)) .... Take Two By Mouth Three Times A Day With Meals. 15)  Onetouch Ultra Test  Strp (Glucose Blood) .... 4x A Day, and  Lancets 250.01 16)  Bd Insulin Syringe Ultrafine 30g X 1/2" 1 Ml Misc (Insulin Syringe-Needle U-100) .... 4x A Day For Insulin 17)  Crestor 40 Mg Tabs (Rosuvastatin Calcium) .... 1/2 Tab Three Times Weekly (M,w,f). 18)  Mucinex Dm 30-600 Mg Xr12h-Tab (Dextromethorphan-Guaifenesin) .... Uad As Needed 19)  Creon 24000 Unit Cpep (Pancrelipase (Lip-Prot-Amyl)) .... Take 2 Capsules Three Times A Day  Allergies: 1)  ! * Actos  Anticoagulation Management History:      The patient is taking warfarin and comes in today for a routine follow up visit.  Positive risk factors for bleeding include an age of 33 years or older and presence of serious comorbidities.  The bleeding index is 'intermediate risk'.  Positive CHADS2 values include History of CHF, History of HTN, and History of Diabetes.  Negative CHADS2 values include Age > 16 years old.  The start date was 06/26/2008.  His last INR was 1.8 ratio.  Anticoagulation responsible provider: Juanda Chance MD, Smitty Cords.  INR POC: 2.2.  Cuvette Lot#: [id number].  Exp: 10/2011.    Anticoagulation Management Assessment/Plan:      The patient's current anticoagulation dose is Warfarin sodium 2 mg tabs: Use as directed by Anticoagualtion Clinic.  The target INR is 2 - 3.  The next INR is due 11/08/2010.  Anticoagulation instructions were given to patient.  Results were reviewed/authorized by Cicero Duck  Laural Benes, Charity fundraiser, BSN.  He was notified by Cloyde Reams RN.         Prior Anticoagulation Instructions: INR 1.9  Take 3 tablets today, then start taking 2 tablets daily except 3 tablets on Sundays, Tuesdays, and Thursdays.  Recheck in 4 weeks.   Current Anticoagulation Instructions: INR 2.2  Continue on same dosage 4mg  daily except 6mg  on Sundays, Tuesdays, and Thursdays.  Recheck in 4 weeks.

## 2011-01-07 NOTE — Assessment & Plan Note (Signed)
Summary: per check out/sf  Medications Added CLOMIPHENE CITRATE 50 MG TABS (CLOMIPHENE CITRATE) 1/4 tab once daily MUCINEX DM 30-600 MG XR12H-TAB (DEXTROMETHORPHAN-GUAIFENESIN) 2-3 daily seasonal as needed      Allergies Added:   Visit Type:  Follow-up Referring Provider:  Romero Belling Primary Provider:  Romero Belling, MD  CC:  shortness of breath.  History of Present Illness: Mr. 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  He is now on long-term Coumadin.  Lower extremity ABIs were greater than 1.0 bilaterally. Returns for routine f/u.  We last him about 6 months ago. At the time appeared volume overloaded but BNP was only 144. We also placed a holter monitor. Monitor showed sinus rhythm w/periods of junctional bradycardia mean HR 58, min HR 43.  Since we last saw him has lost 12 pounds. Feels edema is much improved. Using a pedal exerciser every night for 2-3 five minutes sets. Still SOB with mild walking or 5 miniutes of pedalin. Not changed. No CP. Trying to wear CPAP as much as possible. + fatigue. No suncope.   Very congested for past month and going to see ENT today.        Current Medications (verified): 1)  Aspirin 325 Mg  Tbec (Aspirin) .... Take 1 By Mouth Qd 2)  Omeprazole 20 Mg  Cpdr (Omeprazole) .... Take 1 By Mouth Qd 3)  Zocor 80 Mg  Tabs (Simvastatin) .... Qhs 4)  Glucose Test Strips, Any Brand, and Lancets .... Qid 250.01 5)  Levothyroxine Sodium 50 Mcg  Tabs (Levothyroxine Sodium) .... Take 1 By Mouth Qd 6)  Lumigan 0.03 %  Soln (Bimatoprost) .... Use 1 Drop in Each Eye Qhs 7)  Fludrocortisone Acetate 0.1 Mg Tabs (Fludrocortisone Acetate) .Marland Kitchen.. 1 Tab Three Times  Weekly (M,w,f) 8)  Dorzolamide Hcl 2 % Soln (Dorzolamide Hcl) .Marland Kitchen.. 1 Drop To Each Eye Two Times A Day 9)  Fish Oil 1000 Mg Caps (Omega-3 Fatty Acids) .Marland Kitchen.. 1 Once Daily 10)  Warfarin Sodium 2 Mg Tabs (Warfarin Sodium) .... Use As Directed By Anticoagualtion Clinic 11)  Fenofibrate Micronized 200 Mg Caps (Fenofibrate Micronized) .... Take 1 Tablet By Mouth Once A Day 12)  Vitamin D 400 Unit Caps (Cholecalciferol) .... Take 1 Tablet By Mouth Once A Day 13)  Vitamin C 500 Mg Tabs (Ascorbic Acid) .... Take 1 Tablet By Mouth Once A Day 14)  Furosemide 20 Mg Tabs (Furosemide) .... Take 1 Tablet By Mouth Once A Day 15)  Humulin R U-500 (Concentrated) 500 Unit/ml Soln (Insulin Regular Human) .... Three Times A Day (Just Before Each Meal) 150-175-175-150 Units 16)  Creon 24000 Unit Cpep (Pancrelipase (Lip-Prot-Amyl)) .... 2 Tabs Three Times A Day With Meals. 17)  Clomiphene Citrate 50 Mg Tabs (Clomiphene Citrate) .... 1/4 Tab Once Daily 18)  Mucinex Dm 30-600 Mg Xr12h-Tab (Dextromethorphan-Guaifenesin) .... 2-3 Daily Seasonal As Needed  Allergies (verified): 1)  ! * Actos  Past History:  Past Medical History:  1. Severe diabetes.       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 Hypertension.   3. Obstructive sleep apnea using CPAP at night, although this is hard       for him to tolerate.   4. Renal insufficiency.      -  baseline Cr 1.5-1.7  5. Hyperlipidemia.   6. Anemia   7. COPD  8. Chronic shortness of breath.        --echo 12/10: EF 60-65% LVH. Mild AS. DIastolic dysfx with elevated LV filling pressures. 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   10.Hypothyroidism.   11. Non-obstructive CAD           --catheterization July 2003: EF of 65%, 40% LAD, 40% RCA            --nuclear 5/09: EF 51% normal 12. 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 Osteoarthritis 14. 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   Vital Signs:  Patient profile:   70 year old male Height:      70.5 inches Weight:      299 pounds BMI:     42.45 Pulse rate:   53 / minute BP sitting:   122 / 68  (left arm) Cuff size:   large  Vitals Entered By: Hardin Negus, RMA (Apr 16, 2010 12:08 PM)  Physical Exam  General:  He ambulates around the clinic slowly without any respiratory difficulty. HEENT:  Normal except for watery eyes/allergies NECK:  Supple and thick.  I am unable to evaluate his JVD.  Carotids are 2+ bilaterally without bruits.  No appreciable lymphadenopathy. CARDIAC:  PMI is nonpalpable.  He is regular with distant heart sounds. No obvious murmurs. LUNGS:  Clear with decreased breath sounds throughout.  No wheezes or rales. ABDOMEN:  Markedly obese, nontender, nondistended.  No bruits.  I am unable to examine for any hepatosplenomegaly. EXTREMITIES:  Edema 1+ bilaterally. Chronic venous stasis changes  He has a healing surgical scar in his right shin from his previous thromboembolectomy and fasciotomy.  No rash. NEUROLOGIC:  Alert and oriented x3.  Cranial nerves II through XII are intact.  Moves all 4 extremities without difficulty.  Affect is pleasant.    Impression & Recommendations:  Problem # 1:  BRADYCARDIA (ICD-427.89) Has sick sinus syndrome and significant intrinsic conduction disease. I suspect he may need a pacer. Will refer to EP.   Problem # 2:  DIASTOLIC HEART FAILURE, CHRONIC (ICD-428.32) Volume status looks pretty good. Continue current regimen.  Problem # 3:  DYSPNEA (ICD-786.05) Likely multifactorial. will check O2 sats with ambulation. Congratulate him on weight loss and compliance with CPAP. Suggested he increase his exercise time. O2 sats maintainted in mid 90s with exertion.   Other Orders: EKG w/ Interpretation (93000) EP Referral (Cardiology EP Ref )  Patient Instructions: 1)  You have been referred to  EP 2)   Follow up in 9 months

## 2011-01-07 NOTE — Medication Information (Signed)
Summary: rov/jm  Anticoagulant Therapy  Managed by: Cloyde Reams, RN, BSN PCP: Philmore Pali MD: Myrtis Ser MD, Tinnie Gens Indication 1: Other thrombosis (ICD-453.8) Lab Used: [facility] [place] Site: [facility] INR POC 2.1 INR RANGE 2 - 3  Dietary changes: no    Health status changes: no    Bleeding/hemorrhagic complications: no    Recent/future hospitalizations: no    Any changes in medication regimen? yes       Details: Has been taking amoxicillin for the past 3 weeks.  Recent/future dental: no  Any missed doses?: no       Is patient compliant with meds? yes       Allergies: 1)  ! * Actos  Anticoagulation Management History:      The patient is taking warfarin and comes in today for a routine follow up visit.  Positive risk factors for bleeding include an age of 68 years or older and presence of serious comorbidities.  The bleeding index is 'intermediate risk'.  Positive CHADS2 values include History of HTN and History of Diabetes.  Negative CHADS2 values include Age > 68 years old.  The start date was 06/26/2008.  Anticoagulation responsible provider: Myrtis Ser MD, Tinnie Gens.  INR POC: 2.1.  Cuvette Lot#: [id number].  Exp: 03/2011.    Anticoagulation Management Assessment/Plan:      The patient's current anticoagulation dose is Warfarin sodium 2 mg tabs: Use as directed by Anticoagualtion Clinic.  The target INR is 2 - 3.  The next INR is due 01/22/2010.  Anticoagulation instructions were given to patient.  Results were reviewed/authorized by Cloyde Reams, RN, BSN.  He was notified by Lew Dawes, PharmD Candidate.         Prior Anticoagulation Instructions: INR 1.8  TAKE 2.5 TABLETS TODAY THEN CONTINUE TO TAKE 3 TABS ON SUNDAY AND TUESDAY AND 2 TABS EVERY OTHER DAY.  RECHECK IN 4 WEEKS.  Current Anticoagulation Instructions: INR 2.1  Continue same dose of 2 tablets daily except 3 tablets on Sundays and Tuesdays. Recheck in 4 weeks.

## 2011-01-07 NOTE — Assessment & Plan Note (Signed)
Summary: acute sick visit for cough and chest pain   Copy to:  Romero Belling Primary Provider/Referring Provider:  Romero Belling, MD  CC:  Pt is here for a sick visit.  Pt states he did not see an ENT doctor.  Pt c/o "constant ache on R side" x 1 week.  Pt states the pain will be very sharp when he coughs.  Pt c/o coughing up clear sputum.  Pt denied any sinus congestion or infection.  Pt states DME company got auto download off of machine.  However pt states he has not been using cpap often d/t increased coughing.  Pt states he is out of proair.  Marland Kitchen  History of Present Illness: the pt comes in for an acute sick visit related to persistent cough.  At the last visit, he was treated for acute sinusitis, and referred to ENT for failure to clear.  Unfortunately, the pt did not keep the visit, and had persistent cough and nasal congestion, but did not have further purulence or blood from nares.  He comes in today where he has now developed chest pain on his right side that is worsened everytime her coughs.  He describes as both sharp and dull.  He denies chest congestion or bringing up purulence.  He has had no f/c/s.  Current Medications (verified): 1)  Aspirin 325 Mg  Tbec (Aspirin) .... Take 1 By Mouth Qd 2)  Omeprazole 20 Mg  Cpdr (Omeprazole) .... Take 1 By Mouth Qd 3)  Zocor 80 Mg  Tabs (Simvastatin) .... Qhs 4)  Glucose Test Strips, Any Brand, and Lancets .... Qid 250.01 5)  Levothyroxine Sodium 50 Mcg  Tabs (Levothyroxine Sodium) .... Take 1 By Mouth Qd 6)  Lumigan 0.03 %  Soln (Bimatoprost) .... Use 1 Drop in Each Eye Qhs 7)  Fludrocortisone Acetate 0.1 Mg Tabs (Fludrocortisone Acetate) .Marland Kitchen.. 1 Tab Qd 8)  Dorzolamide Hcl 2 % Soln (Dorzolamide Hcl) .Marland Kitchen.. 1 Drop To Each Eye Two Times A Day 9)  Fish Oil 1000 Mg Caps (Omega-3 Fatty Acids) .Marland Kitchen.. 1 Once Daily 10)  Warfarin Sodium 2 Mg Tabs (Warfarin Sodium) .... Use As Directed By Anticoagualtion Clinic 11)  Relion R 100 Unit/ml Soln (Insulin Regular  Human) .... Qid (Qac) 210-210-225-50 Units 12)  Fenofibrate Micronized 200 Mg Caps (Fenofibrate Micronized) .... Take 1 Tablet By Mouth Once A Day 13)  Vitamin D 400 Unit Caps (Cholecalciferol) .... Take 1 Tablet By Mouth Once A Day 14)  Vitamin C 500 Mg Tabs (Ascorbic Acid) .... Take 1 Tablet By Mouth Once A Day 15)  Furosemide 20 Mg Tabs (Furosemide) .... Take 1 Tablet By Mouth Once A Day 16)  Proair Hfa 108 (90 Base) Mcg/act  Aers (Albuterol Sulfate) .... 2 Puffs Every 4-6 Hours As Needed 17)  Nasonex 50 Mcg/act  Susp (Mometasone Furoate) .... Two Puffs Each Nostril Daily 18)  Neilmed Sinus Rinses .... Use Two To Three Times A Day 19)  Mucinex Dm .... Take Three Times A Day 20)  Clomiphene Citrate 50 Mg Tabs (Clomiphene Citrate) .... 1/4 Tab Once Daily  Allergies (verified): 1)  ! * Actos  Review of Systems       The patient complains of non-productive cough, chest pain, nasal congestion/difficulty breathing through nose, and hand/feet swelling.  The patient denies shortness of breath with activity, shortness of breath at rest, productive cough, coughing up blood, irregular heartbeats, acid heartburn, indigestion, loss of appetite, weight change, abdominal pain, difficulty swallowing, sore throat, tooth/dental problems,  headaches, sneezing, itching, ear ache, anxiety, depression, joint stiffness or pain, rash, change in color of mucus, and fever.    Vital Signs:  Patient profile:   70 year old male Height:      71 inches Weight:      304 pounds BMI:     42.55 O2 Sat:      94 % on Room air Temp:     98.2 degrees F oral Pulse rate:   46 / minute BP sitting:   124 / 62  (left arm) Cuff size:   large  Vitals Entered By: Arman Filter LPN (December 26, 2009 10:44 AM)  O2 Flow:  Room air CC: Pt is here for a sick visit.  Pt states he did not see an ENT doctor.  Pt c/o "constant ache on R side" x 1 week.  Pt states the pain will be very sharp when he coughs.  Pt c/o coughing up clear  sputum.  Pt denied any sinus congestion or infection.  Pt states DME company got auto download off of machine.  However pt states he has not been using cpap often d/t increased coughing.  Pt states he is out of proair.   Comments Medications reviewed with patient Arman Filter LPN  December 26, 2009 10:45 AM    Physical Exam  General:  obese male in nad Nose:  no obvious purulence or drainage. Lungs:  clear to auscultation no wheezing or rhonchi Heart:  rrr Extremities:  mild edema, no cyanosis  Neurologic:  alert and oriented, moves all 4.   Impression & Recommendations:  Problem # 1:  CHEST PAIN, PLEURITIC (ICD-786.52) I suspect the pt's discomfort is from his cough.  There is not a strong history to support pna or PE.  Will check a cxr for completeness.  Will treat with cough suppression, and this should help with the pain.  The pt knows to call if the pain persists.  Problem # 2:  SINUSITIS, CHRONIC (ICD-473.9) the pt is having persistent symptoms that are not clearing in a normal fashion, and is interfering with his ability to wear cpap.  I will refer him to ENT again.  Problem # 3:  OBSTRUCTIVE SLEEP APNEA (ICD-327.23) he is to get back on cpap as soon as his nasal airway improves, and cough decreases.  Will get download after that.  Medications Added to Medication List This Visit: 1)  Neilmed Sinus Rinses  .... Use two to three times a day 2)  Hydromet 5-1.5 Mg/92ml Syrp (Hydrocodone-homatropine) .Marland Kitchen.. 10cc by mouth q6h as needed cough.  Other Orders: Est. Patient Level IV (16109) ENT Referral (ENT) T-2 View CXR (71020TC)  Patient Instructions: 1)  will check cxr today and call you with results. 2)  continue sinus rinses 3)  will get an apptm for you to see Dr. Ezzard Standing for your sinuses 4)  will give you cough medicine that will also help your pain.  Do not take if driving. 5)  get back on cpap as soon as you can.  Please call me when you are able to wear consistently,  and will get data downloaded.  If not able to wear consistently, please call so we can figure out why.  Prescriptions: HYDROMET 5-1.5 MG/5ML SYRP (HYDROCODONE-HOMATROPINE) 10cc by mouth q6h as needed cough.  #6 ounces x 0   Entered and Authorized by:   Barbaraann Share MD   Signed by:   Barbaraann Share MD on 12/26/2009   Method used:  Print then Give to Patient   RxID:   [id number]

## 2011-01-07 NOTE — Medication Information (Signed)
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 no    Recent/future dental: no  Any missed doses?: no       Is patient compliant with meds? yes       Allergies (verified): 1)  ! * Actos  Anticoagulation Management History:      The patient is taking warfarin and comes in today for a routine follow up visit.  Positive risk factors for bleeding include an age of 66 years or older and presence of serious comorbidities.  The bleeding index is 'intermediate risk'.  Positive CHADS2 values include History of HTN and History of Diabetes.  Negative CHADS2 values include Age > 67 years old.  The start date was 06/26/2008.  Anticoagulation responsible provider: Myrtis Ser MD, Tinnie Gens.  INR POC: 2.1.  Cuvette Lot#: [id number].  Exp: 03/2011.    Anticoagulation Management Assessment/Plan:      The patient's current anticoagulation dose is Warfarin sodium 2 mg tabs: Use as directed by Anticoagualtion Clinic.  The target INR is 2 - 3.  The next INR is due 02/19/2010.  Anticoagulation instructions were given to patient.  Results were reviewed/authorized by Cloyde Reams, RN, BSN.  He was notified by Shelby Dubin PharmD, BCPS, CPP.         Prior Anticoagulation Instructions: INR 2.1  Continue same dose of 2 tablets daily except 3 tablets on Sundays and Tuesdays. Recheck in 4 weeks.  Current Anticoagulation Instructions: INR 2.1  Continue 3 tabs each Sunday and Tuesday and 2 tabs on all other days.  Recheck in 4 weeks.

## 2011-01-07 NOTE — Assessment & Plan Note (Signed)
Summary: f61m  Medications Added HUMULIN R U-500 (CONCENTRATED) 500 UNIT/ML SOLN (INSULIN REGULAR HUMAN) three times a day (just before each meal) 175-175-175-200 units      Allergies Added:   Visit Type:  Follow-up Referring [name]:  Dr [name] Primary [name]:  [name] Belling, MD  CC:  sob.  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  He is now on long-term Coumadin.  [name] extremity ABIs were greater than 1.0 bilaterally. Returns for routine f/u.  Since we last saw him underwent pacemaker placement and feels like he has much more energy. Continues with chronic dyspnea without much change. No CP. Chronic mild edema. No orthopnea or PND. Having trouble with his CPAP. Has been unsuccessful with diet efforts. Unable to afford [employer].  Current Medications (verified): 1)  Aspirin 325 Mg  Tbec (Aspirin) .... Take 1 By Mouth Qd 2)  Omeprazole 20 Mg  Cpdr (Omeprazole) .... Take 1 By Mouth Qd 3)  Zocor 80 Mg  Tabs (Simvastatin) .... Qhs 4)  Levothyroxine Sodium 50 Mcg  Tabs (Levothyroxine Sodium) .... Take 1 By Mouth Qd 5)  Lumigan 0.03 %  Soln (Bimatoprost) .... Use 1 Drop in Each Eye Qhs 6)  Fludrocortisone Acetate 0.1 Mg Tabs (Fludrocortisone Acetate) .... Take One Tablet By Mouth Once Daily. 7)  Dorzolamide Hcl 2 % Soln (Dorzolamide Hcl) .Marland Kitchen.. 1 Drop To Each Eye Two Times A Day 8)  Fish Oil 1000 Mg Caps (Omega-3 Fatty Acids) .Marland Kitchen.. 1 Once Daily 9)  Warfarin Sodium 2 Mg Tabs (Warfarin Sodium) .... Use As Directed By Anticoagualtion Clinic 10)  Fenofibrate Micronized 200 Mg Caps (Fenofibrate Micronized) .... Take 1 Tablet By Mouth Once A Day 11)   Vitamin D 400 Unit Caps (Cholecalciferol) .... Take 1 Tablet By Mouth Once A Day 12)  Vitamin C 500 Mg Tabs (Ascorbic Acid) .... Take 1 Tablet By Mouth Once A Day 13)  Furosemide 20 Mg Tabs (Furosemide) .... Take 1 Tablet By Mouth Once A Day 14)  Humulin R U-500 (Concentrated) 500 Unit/ml Soln (Insulin Regular Human) .... Three Times A Day (Just Before Each Meal) 175-175-175-200 Units 15)  Creon 24000 Unit Cpep (Pancrelipase (Lip-Prot-Amyl)) .... 2 Tabs Three Times A Day With Meals. 16)  Onetouch Ultra Test  Strp (Glucose Blood) .... 4x A Day, and Lancets 250.01 17)  Bd Insulin Syringe Ultrafine 30g X 1/2" 1 Ml Misc (Insulin Syringe-Needle U-100) .... 4x A Day For Insulin  Allergies (verified): 1)  ! * Actos  Past History:  Past Medical History: Last updated: 04/16/2010  1. Severe diabetes.       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 Hypertension.   3. Obstructive sleep apnea using CPAP at night, although this is hard       for him to tolerate.   4. Renal insufficiency.      -baseline Cr 1.5-1.7  5. Hyperlipidemia.   6. Anemia   7. COPD  8. Chronic shortness of breath.        --echo 12/10: EF 60-65% LVH. Mild AS. DIastolic dysfx with elevated LV filling pressures. 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   10.Hypothyroidism.   11. Non-obstructive CAD           --  catheterization July 2003: EF of 65%, 40% LAD, 40% RCA            --nuclear 5/09: EF 51% normal 12. 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 Osteoarthritis 14. 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   Vital Signs:  Patient profile:   70 year old male Height:      70 inches Weight:      306 pounds BP sitting:   114 / 50  (left arm) Cuff size:   large  Vitals Entered By: [name] Kanaris, CNA (July 30, 2010 3:45 PM)  Physical Exam  General:  He ambulates around  the clinic slowly without any respiratory difficulty. HEENT:  Normal NECK:  Supple and thick.  I am unable to evaluate his JVD.  Carotids are 2+ bilaterally without bruits.  No appreciable lymphadenopathy. CARDIAC:  PMI is nonpalpable.  He is regular with distant heart sounds. No obvious murmurs. LUNGS:  Clear with decreased breath sounds throughout.  No wheezes or rales. ABDOMEN:  Markedly obese, nontender, nondistended.  No bruits.  I am unable to examine for any hepatosplenomegaly. EXTREMITIES:  Edema 1+ bilaterally. Chronic venous stasis changes  He has a healing surgical scar in his right shin from his previous thromboembolectomy and fasciotomy.  No rash. NEUROLOGIC:  Alert and oriented x3.  Cranial nerves II through XII are intact.  Moves all 4 extremities without difficulty.  Affect is pleasant.    PPM Specifications Following MD:  [name] Range, MD     PPM Vendor:  [employer]     PPM Model Number:  [id number]     PPM Serial Number:  [id number] PPM DOI:  06/11/2010     PPM Implanting MD:  [name] Range, MD  Lead 1    Location: [facility]     DOI: 06/11/2010     Model #: [id number]     Serial #: [id number]     Status: active Lead 2    Location: [facility]     DOI: 06/11/2010     Model #: [id number]     Serial #: [id number]     Status: active  Magnet Response Rate:  BOL 100 ERI  85  Indications:  Huston Foley   PPM Follow Up Pacer Dependent:  No      Episodes Coumadin:  Yes  Parameters Mode:  DDDR     [name] Rate Limit:  60     Upper Rate Limit:  115 Paced AV Delay:  200     Sensed AV Delay:  200  Impression & Recommendations:  Problem # 1:  DIASTOLIC HEART FAILURE, CHRONIC (ICD-428.32) Doing well. Volume status looks good. No change.  Orders: EKG w/ Interpretation (93000)  Problem # 2:  DYSPNEA (ICD-786.05) Improved after pacer implant. Suspect his obesity is major factor now. Encouraged him to continue weight loss efforts. At some point can consider repeating stress test if symptoms get worse.   Problem # 3:    Bradycardia S/p pacer. will f/u with EP.   Patient Instructions: 1)  Your physician wants you to follow-up in:  9 months with Dr. Gala [name]. You will receive a reminder letter in the mail two months in advance. If you don't receive a letter, please call our office to schedule the follow-up appointment. 2)  You will need to see Dr. Johney Frame after 09/11/10 for device followup.  3)  Your physician recommends  that you continue on your current medications as directed. Please refer to the Current Medication list given to you today.

## 2011-01-07 NOTE — Miscellaneous (Signed)
Summary: Orders Update  Clinical Lists Changes  Orders: Added new Referral order of DME Referral (DME) - Signed 

## 2011-01-07 NOTE — Progress Notes (Signed)
Summary: sinus infection  Phone Note Call from Patient Call back at Home Phone [phone]   Caller: Spouse Call For: clance Summary of Call: Pt stated he was given penicillin for a previous sinus infection, has sinus infection now, would like to get penicillin again.//[facility] Initial call taken by: Darletta Moll,  December 11, 2009 4:03 PM  Follow-up for Phone Call        called and spoke with pt's spouse.  pt was last seen by [facility] 11-14-2009.  Pt was given augmentin, Tessalon Perles and Magic Mouthwash.  Pt called 11-28-2009 stating symptoms were improving at that time.  Per phone note on 11-28-2009, KC stated to call if symptoms worsen.  Pt's spouse calling today stating pt c/o cough with yellow sputum, yellow nasal drainage with streaks of blood in it, and sinus/head pressure.  pt denied increased sob or fever.  symptoms started 1 1/2 weeks ago.  pt is using sinus rinses.  informed wife KC out of office this afternoon and will return tomorrow therefore message will be forwarded to doc of the day.  wife stated she didn't want message forwarded to doc of the day and requested it to go to [facility] to address tomorrow. Will forward message to kc.  Aundra Millet Reynolds LPN  December 11, 2009 4:19 PM   Additional Follow-up for Phone Call Additional follow up Details #1::        will treat with abx, but I also told him if symptoms persisted, would need to see ENT. call in augmentin 875mg  one in am and pm for 14 days.  #28, no fills.  Find out if he has ever seen an ENT before, and I will make the referral. Additional Follow-up by: Barbaraann Share MD,  December 12, 2009 1:43 PM  New Problems: SINUSITIS, CHRONIC (ICD-473.9)   Additional Follow-up for Phone Call Additional follow up Details #2::    Spoke with pt and made aware that we will go ahead and call in abx but will refer to ENT.  Pt verbalized understanding and agrees with this plan.  He states that he has was seen by ENT in the past but does  not remember who he saw, it was "years ago".  Advised that we will be sending order to [facility] for refferal.  ABX called to pharm. Follow-up by: Vernie Murders,  December 12, 2009 2:24 PM  New Problems: SINUSITIS, CHRONIC (ICD-473.9) New/Updated Medications: AMOXICILLIN-POT CLAVULANATE 875-125 MG TABS (AMOXICILLIN-POT CLAVULANATE) 1 by mouth two times a day x 14 days Prescriptions: AMOXICILLIN-POT CLAVULANATE 875-125 MG TABS (AMOXICILLIN-POT CLAVULANATE) 1 by mouth two times a day x 14 days  #28 x 0   Entered by:   Vernie Murders   Authorized by:   Barbaraann Share MD   Signed by:   Vernie Murders on 12/12/2009   Method used:   [facility]        [facility] [phone]* (retail)       [address]       [place], [place]  [address]       Ph: [phone]       Fax: [phone]   RxID:   [id number]

## 2011-01-09 ENCOUNTER — Encounter: Payer: Self-pay | Admitting: Endocrinology

## 2011-01-09 NOTE — Assessment & Plan Note (Signed)
Summary: WEEKLY B12 SHOT...3 OF 3...LSW.  Nurse Visit   Allergies: 1)  ! * Actos  Medication Administration  Injection # 1:    Medication: Vit B12 1000 mcg    Diagnosis: B12 DEFICIENCY (ICD-266.2)    Route: IM    Site: R deltoid    Exp Date: 09/07/2012    Lot #: [id number]    Mfr: American Regent    Patient tolerated injection without complications    Given by: Selinda Michaels RN (January 02, 2011 10:53 AM)  Orders Added: 1)  Vit B12 1000 mcg [J3420]

## 2011-01-09 NOTE — Medication Information (Signed)
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 no    Recent/future dental: no  Any missed doses?: no       Is patient compliant with meds? yes       Allergies: 1)  ! * Actos  Anticoagulation Management History:      The patient is taking warfarin and comes in today for a routine follow up visit.  Positive risk factors for bleeding include an age of 68 years or older and presence of serious comorbidities.  The bleeding index is 'intermediate risk'.  Positive CHADS2 values include History of CHF, History of HTN, and History of Diabetes.  Negative CHADS2 values include Age > 26 years old.  The start date was 06/26/2008.  His last INR was 1.8 ratio.  Anticoagulation responsible provider: Jens Som MD, Arlys John.  INR POC: 2.1.  Cuvette Lot#: [id number].  Exp: 01/2012.    Anticoagulation Management Assessment/Plan:      The patient's current anticoagulation dose is Warfarin sodium 2 mg tabs: Use as directed by Anticoagualtion Clinic.  The target INR is 2 - 3.  The next INR is due 01/10/2011.  Anticoagulation instructions were given to patient.  Results were reviewed/authorized by Bethena Midget, RN, BSN.  He was notified by Bethena Midget, RN, BSN.         Prior Anticoagulation Instructions: INR 2.7 Continue 4mg s everyday except 6mg s on Tuesdays, Thursdays and Sundays. Recheck in 4 weeks.   Current Anticoagulation Instructions: INR 2.1 Continue 2 pills everyday except 3 pills on Sundays, Tuesdays, and Thursdays. Recheck in 4 weeks.

## 2011-01-09 NOTE — Assessment & Plan Note (Signed)
Summary: 1 of 3 weekly b12/lk  Nurse Visit   Allergies: 1)  ! * Actos  Medication Administration  Injection # 1:    Medication: Vit B12 1000 mcg    Diagnosis: B12 DEFICIENCY (ICD-266.2)    Route: IM    Site: R deltoid    Exp Date: 09/07/2012    Lot #: [id number]    Mfr: American Regent    Patient tolerated injection without complications    Given by: Christie Nottingham CMA (AAMA) (December 19, 2010 10:23 AM)  Orders Added: 1)  Vit B12 1000 mcg [J3420]

## 2011-01-09 NOTE — Assessment & Plan Note (Signed)
Summary: #2 out of 3 weekly b-12 inj/all  Nurse Visit   Allergies: 1)  ! * Actos  Medication Administration  Injection # 1:    Medication: Vit B12 1000 mcg    Diagnosis: B12 DEFICIENCY (ICD-266.2)    Route: IM    Site: R deltoid    Exp Date: 10/08/2012    Lot #: [id number]    Mfr: American Regent    Comments: #2 OF 3 WEEKLY INJECTIONS    Patient tolerated injection without complications    Given by: June McMurray CMA Duncan Dull) (December 26, 2010 10:32 AM)  Orders Added: 1)  Vit B12 1000 mcg [J3420]   Medication Administration  Injection # 1:    Medication: Vit B12 1000 mcg    Diagnosis: B12 DEFICIENCY (ICD-266.2)    Route: IM    Site: R deltoid    Exp Date: 10/08/2012    Lot #: [id number]    Mfr: American Regent    Comments: #2 OF 3 WEEKLY INJECTIONS    Patient tolerated injection without complications    Given by: June McMurray CMA Duncan Dull) (December 26, 2010 10:32 AM)  Orders Added: 1)  Vit B12 1000 mcg [J3420]

## 2011-01-09 NOTE — Assessment & Plan Note (Signed)
Summary: hosp. f/u//recent pancreatitis--ch.    History of Present Illness Visit Type: Follow-up Visit Primary GI MD: Sheryn Bison MD FACP FAGA Primary Provider: Romero Belling, MD Requesting Provider: na Chief Complaint: [facility] f/u for pancreatitis. Pt states still having LLQ abd pain  History of Present Illness:   Very Complicated 70 year old obese white male insulin-dependent diabetes and a history of chronic alcoholism. He has no known chronic pancreatitis from previous alcohol use confirmed normal initial CT colonoscopy exam this past fall. He was recently admitted with acute pancreatitis and had a small pancreatic pseudocyst. Review of his radiographs show that he probably has small gallstones. Since his discharge he has been asymptomatic and apparently has discontinued all alcohol use. He denies current GI complaints and is on omeprazole 20 mg a day, pancreatic extracts, and probiotics. He denies any cardiopulmonary complaints at this time. His appetite is good and his weight is stable.  He denies clay-colored stools, dark urine, or icterus. He also denies dyspepsia or dysphagia. His stools are regular without melena or hematochezia.   GI Review of Systems    Reports abdominal pain.     Location of  Abdominal pain: LLQ.    Denies acid reflux, belching, bloating, chest pain, dysphagia with liquids, dysphagia with solids, heartburn, loss of appetite, nausea, vomiting, vomiting blood, weight loss, and  weight gain.        Denies anal fissure, black tarry stools, change in bowel habit, constipation, diarrhea, diverticulosis, fecal incontinence, heme positive stool, hemorrhoids, irritable bowel syndrome, jaundice, light color stool, liver problems, rectal bleeding, and  rectal pain.    Current Medications (verified): 1)  Aspirin 81 Mg Tbec (Aspirin) .... Take One Tablet By Mouth Daily 2)  Omeprazole 20 Mg  Cpdr (Omeprazole) .... Take 1 By Mouth Qd 3)  Levothyroxine Sodium 50 Mcg  Tabs  (Levothyroxine Sodium) .... Take 1 By Mouth Qd 4)  Lumigan 0.03 %  Soln (Bimatoprost) .... Use 1 Drop in Each Eye Qhs 5)  Fludrocortisone Acetate 0.1 Mg Tabs (Fludrocortisone Acetate) .... Take One Tablet By Mouth Once Daily. 6)  Dorzolamide Hcl 2 % Soln (Dorzolamide Hcl) .Marland Kitchen.. 1 Drop To Each Eye Two Times A Day 7)  Fish Oil 1000 Mg Caps (Omega-3 Fatty Acids) .Marland Kitchen.. 1 Once Daily 8)  Warfarin Sodium 2 Mg Tabs (Warfarin Sodium) .... Use As Directed By Anticoagualtion Clinic 9)  Fenofibrate Micronized 200 Mg Caps (Fenofibrate Micronized) .... Take 1 Tablet By Mouth Once A Day 10)  Vitamin D 400 Unit Caps (Cholecalciferol) .... Take 1 Tablet By Mouth Once A Day 11)  Vitamin C 500 Mg Tabs (Ascorbic Acid) .... Take 1 Tablet By Mouth Once A Day 12)  Furosemide 20 Mg Tabs (Furosemide) .... Take 1 Tablet By Mouth Once A Day 13)  Humulin R U-500 (Concentrated) 500 Unit/ml Soln (Insulin Regular Human) .... Three Times A Day (Just Before Each Meal) 200-175-175-175 Units 14)  Zenpep 25000 Unit Cpep (Pancrelipase (Lip-Prot-Amyl)) .... Take Two By Mouth Three Times A Day With Meals. 15)  Onetouch Ultra Test  Strp (Glucose Blood) .... 4x A Day, and Lancets 250.01 16)  Bd Insulin Syringe Ultrafine 30g X 1/2" 1 Ml Misc (Insulin Syringe-Needle U-100) .... 4x A Day For Insulin 17)  Crestor 40 Mg Tabs (Rosuvastatin Calcium) .... 1/2 Tab Three Times Weekly (M,w,f). 18)  Mucinex Dm 30-600 Mg Xr12h-Tab (Dextromethorphan-Guaifenesin) .... Uad As Needed 19)  Acidophilus  Caps (Lactobacillus) .... One Capsuel By Mouth Once Daily 20)  Oxycodone-Acetaminophen 5-325 Mg Tabs (Oxycodone-Acetaminophen) .Marland KitchenMarland KitchenMarland Kitchen  As Needed  Allergies (verified): 1)  ! * Actos  Past History:  Past medical, surgical, family and social histories (including risk factors) reviewed for relevance to current acute and chronic problems.  Past Medical History:  1. Severe diabetes.       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 Hypertension.   3. Obstructive  sleep apnea using CPAP at night, although this is hard       for him to tolerate.   4. Renal insufficiency.      -baseline Cr 1.5-1.7  5. Hyperlipidemia.   6. Anemia   7. COPD  8. Chronic shortness of breath.        --echo 12/10: EF 60-65% LVH. Mild AS. DIastolic dysfx with elevated LV filling pressures. 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   10.Hypothyroidism.   11. Non-obstructive CAD           --catheterization July 2003: EF of 65%, 40% LAD, 40% RCA            --nuclear 5/09: EF 51% normal 12. 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 Osteoarthritis 15. Glaucoma  Legenaires Disease 16. Sick sinus syndrome s/p PPM implant 7/11 by JA 17. 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 Obesity   Past Surgical History: Reviewed history from 09/26/2010 and no changes required. 1) right femoral  thrombosis, status post right femoral thromboembolectomy and four-   compartment fasciotomy 2009  deviated septum 1967   Pacemaker implantation.  [employer] Accent RF DR, model [id number] (serial number [id number])   pacemaker 7/11 by Fawn Kirk     Family History: Reviewed history from 04/30/2010 and no changes required. Mother deceased from liver cancer at age 84.   Father  deceased with age 61 from an MI.   He has no brother or sisters.  No FH of Colon Cancer:  Social History: Reviewed history from 05/30/2010 and no changes required. He lives in the [place] with his wife.  pt has children.  He is retired from [employer].    He has a 50-pack-year smoke hx,  greater than 2 ppd but quit 2008.   Alcohol, 2-3 beers a week.   Negative for drug use.    .   Vital Signs:  Patient profile:   70 year old male Height:      70 inches Weight:      301 pounds BMI:     43.35 BSA:     2.49 Pulse rate:   60 / minute Pulse rhythm:   regular BP sitting:   132 / 64  (left arm) Cuff size:   large  Vitals Entered By: Ok Anis CMA  (December 17, 2010 11:01 AM)  Physical Exam  General:  Well developed, well nourished, no acute distress.obese.   Head:  Normocephalic and atraumatic. Eyes:  PERRLA, no icterus.exam deferred to patient's ophthalmologist.   Lungs:  Clear throughout to auscultation. Heart:  Regular rate and rhythm; no murmurs, rubs,  or bruits. Abdomen:  Soft, nontender and nondistended. No masses, hepatosplenomegaly or hernias noted. Normal bowel sounds.obese.  difficult exam for her massive obesity but no localized tenderness noted. Bowel sounds are normal. Rectal:  deferred Extremities:  1+ pedal edema.   Neurologic:  Alert and  oriented x4;  grossly normal neurologically. Psych:  Alert and cooperative. Normal mood and affect.depressed affect.     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 CT Scans have shown evidence of small gallstones although these were not visualized on ultrasound exam probably related to his massive obesity. His BMI today is 43.35. I have advised him to avoid alcohol altogether, continue his pancreatic extract, and I placed him on Urso 300 mg a day as a bile-salt solvent. We will repeat his CT scan in 6-8 weeks' time to followup on his pseudocyst and has gallstones. Followup labs have also been ordered today. Orders: TLB-Hepatic/Liver Function Pnl (80076-HEPATIC) TLB-B12, Serum-Total ONLY (16109-U04) TLB-Ferritin (82728-FER) TLB-Folic Acid (Folate) (82746-FOL) TLB-IBC Pnl (Iron/FE;Transferrin) (83550-IBC) TLB-Amylase (82150-AMYL) TLB-Lipase (83690-LIPASE) TLB-Magnesium (Mg) (83735-MG) T-Tissue Transglutamase Ab IgA (54098-11914) T-Trypsinogen Serum (78295-62130) T-CDT (carbohydrate deficient transferrin) (86578-46962) TLB-BMP (Basic Metabolic Panel-BMET) (80048-METABOL)  Problem # 2:  DIASTOLIC HEART FAILURE, CHRONIC  (ICD-428.32) Assessment: Improved continue all other multiple medications as listed and reviewed his record per cardiology and Dr. Everardo All.  Problem # 3:  OBSTRUCTIVE SLEEP APNEA (ICD-327.23) Assessment: Unchanged Continue CPAP usage as tolerated.  Problem # 4:  FATTY LIVER DISEASE (ICD-571.8) Assessment: Unchanged  Problem # 5:  DIABETES MELLITUS, TYPE I (ICD-250.01) Assessment: Unchanged  Other Orders: CT Abdomen/Pelvis w & w/o Contrast (CT A/P w&w/o Cont)  Patient Instructions: 1)  Copy sent to : Romero Belling, MD 2)  Your prescription(s) have been sent to you pharmacy.  3)  Please go to the basement today for your labs.  4)  Your CT scan is scheduled for 01/29/2011, please follow the seperate instructions.  5)  Please schedule a follow-up appointment in 2 months.  6)  The medication list was reviewed and reconciled.  All changed / newly prescribed medications were explained.  A complete medication list was provided to the patient / caregiver. 7)  Pancreatitis brochure given.  Prescriptions: URSODIOL 300 MG CAPS (URSODIOL) take one by mouth once daily  #30 x 6   Entered by:   Harlow Mares CMA (AAMA)   Authorized by:   Mardella Layman MD [facility]   Signed by:   Harlow Mares CMA (AAMA) on 12/17/2010   Method used:   [facility]        [facility] [phone]* (retail)       [address]       [place], [place]  [address]       Ph: [phone]       Fax: [phone]   RxID:   [id number]   Appended Document: hosp. f/u//recent pancreatitis--ch. ELEVATED CDT ...consistant with HEAVY ALCOHOL ABUSE  Appended Document: hosp. f/u//recent pancreatitis--ch. patients wife states that the patient is not drinking alcohol but she will advise him to advoid it due to his pancreatitis.

## 2011-01-09 NOTE — Medication Information (Signed)
Summary: Prior autho for Humulin/[facility]  Prior autho for Humulin/[facility]   Imported By: Sherian Rein 12/04/2010 09:21:06  _____________________________________________________________________  External Attachment:    Type:   Image     Comment:   External Document

## 2011-01-09 NOTE — Medication Information (Signed)
Summary: Humulin approved/[facility]  Humulin approved/[facility]   Imported By: Sherian Rein 11/22/2010 12:42:32  _____________________________________________________________________  External Attachment:    Type:   Image     Comment:   External Document

## 2011-01-10 ENCOUNTER — Encounter (INDEPENDENT_AMBULATORY_CARE_PROVIDER_SITE_OTHER): Payer: Medicare Other

## 2011-01-10 ENCOUNTER — Encounter: Payer: Self-pay | Admitting: Cardiology

## 2011-01-10 ENCOUNTER — Ambulatory Visit: Admit: 2011-01-10 | Payer: Self-pay

## 2011-01-10 DIAGNOSIS — Z7901 Long term (current) use of anticoagulants: Secondary | ICD-10-CM

## 2011-01-10 DIAGNOSIS — I749 Embolism and thrombosis of unspecified artery: Secondary | ICD-10-CM

## 2011-01-15 NOTE — Medication Information (Signed)
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 no    Recent/future dental: no  Any missed doses?: no       Is patient compliant with meds? yes       Allergies: 1)  ! * Actos  Anticoagulation Management History:      The patient is taking warfarin and comes in today for a routine follow up visit.  Positive risk factors for bleeding include an age of 68 years or older and presence of serious comorbidities.  The bleeding index is 'intermediate risk'.  Positive CHADS2 values include History of CHF, History of HTN, and History of Diabetes.  Negative CHADS2 values include Age > 2 years old.  The start date was 06/26/2008.  His last INR was 1.8 ratio.  Anticoagulation responsible provider: Jens Som MD, Arlys John.  INR POC: 2.0.  Cuvette Lot#: [id number].  Exp: 12/2011.    Anticoagulation Management Assessment/Plan:      The patient's current anticoagulation dose is Warfarin sodium 2 mg tabs: Use as directed by Anticoagualtion Clinic.  The target INR is 2 - 3.  The next INR is due 02/07/2011.  Anticoagulation instructions were given to patient.  Results were reviewed/authorized by Weston Brass, PharmD.  He was notified by Margot Chimes PharmD Candidate.         Prior Anticoagulation Instructions: INR 2.1 Continue 2 pills everyday except 3 pills on Sundays, Tuesdays, and Thursdays. Recheck in 4 weeks.   Current Anticoagulation Instructions: INR: 2.0  Continue current Coumadin regimen of 2 tablets on Monday, Wednesday, Friday, and Saturday, and 3 tablets on Sunday, Tuesday, and Thursday

## 2011-01-15 NOTE — Assessment & Plan Note (Signed)
Summary: 4 mso f/u #/cd   Vital Signs:  Patient profile:   70 year old male Height:      70 inches Weight:      303 pounds BMI:     43.63 O2 Sat:      97 % on Room air Temp:     98.4 degrees F oral Pulse rate:   80 / minute BP sitting:   128 / 62  (left arm) Cuff size:   large  Vitals Entered By: Margaret Pyle, CMA (January 01, 2011 1:13 PM)  O2 Flow:  Room air CC: 4 mth f/u on DM Comments Pt has paperwork for PA Humalin R    Referring Provider:  na Primary Provider:  Romero Belling, MD  CC:  4 mth f/u on DM.  History of Present Illness: pt was recently hispitalized for pancreatitis.  he brings a record of his cbg's which i have reviewed today.  he had 1 episode of mild hypoglycemia, in the afternoon (57).  it varies from 91-166, with no trend throughout the day.  it is highest at hs.  he only takes 200 units with breakfast, and only 175 at supper.  Allergies: 1)  ! * Actos  Past History:  Past Medical History: Last updated: 12/17/2010  1. Severe diabetes.       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 Hypertension.   3. Obstructive sleep apnea using CPAP at night, although this is hard       for him to tolerate.   4. Renal insufficiency.      -baseline Cr 1.5-1.7  5. Hyperlipidemia.   6. Anemia   7. COPD  8. Chronic shortness of breath.        --echo 12/10: EF 60-65% LVH. Mild AS. DIastolic dysfx with elevated LV filling pressures. 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   10.Hypothyroidism.   11. Non-obstructive CAD           --catheterization July 2003: EF of 65%, 40% LAD, 40% RCA            --nuclear 5/09: EF 51% normal 12. 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 Osteoarthritis 15. Glaucoma  Legenaires Disease 16. Sick sinus syndrome s/p PPM implant 7/11 by JA 17. 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 Obesity   Review of Systems  The patient denies syncope.    Physical Exam  General:  normal  appearance.   Pulses:  dorsalis pedis intact bilat.  Extremities:  1+ right pedal edema and 1+ left pedal edema.   no deformity.  no ulcer on the feet.  feet are of normal color and temp.   there are 3 healed surgical scars on the right leg, and severely mycotic toenails.   the skin on the feet is very dry. Neurologic:  sensation is intact to touch on the feet  Additional Exam:  Hemoglobin A1C       (in hospital)  7.7   Impression & Recommendations:  Problem # 1:  DIABETES MELLITUS, TYPE I (ICD-250.01) needs increased rx  Medications Added to Medication List This Visit: 1)  Humulin R U-500 (concentrated) 500 Unit/ml Soln (Insulin regular human) .... Three times a day (just before each meal) 200-175-200-175 units  Other Orders: Est. Patient Level III (16109)  Patient Instructions: 1)  Please schedule a regular physical appointment in 4 months. 2)  increase reg insulin to (u-500), (just before each meal), 200-175-200-175 units.   Orders Added:  1)  Est. Patient Level III [16109]

## 2011-01-29 ENCOUNTER — Encounter: Payer: Self-pay | Admitting: Gastroenterology

## 2011-01-29 ENCOUNTER — Other Ambulatory Visit: Payer: Self-pay | Admitting: Gastroenterology

## 2011-01-29 ENCOUNTER — Telehealth: Payer: Self-pay | Admitting: Gastroenterology

## 2011-01-29 ENCOUNTER — Ambulatory Visit (INDEPENDENT_AMBULATORY_CARE_PROVIDER_SITE_OTHER)
Admission: RE | Admit: 2011-01-29 | Discharge: 2011-01-29 | Disposition: A | Discharge status: Home / Self Care | Payer: Medicare Other | Source: Ambulatory Visit | Attending: Gastroenterology | Admitting: Gastroenterology

## 2011-01-29 DIAGNOSIS — K862 Cyst of pancreas: Secondary | ICD-10-CM

## 2011-01-29 DIAGNOSIS — K863 Pseudocyst of pancreas: Secondary | ICD-10-CM

## 2011-02-03 ENCOUNTER — Telehealth: Payer: Self-pay | Admitting: Gastroenterology

## 2011-02-04 ENCOUNTER — Encounter (INDEPENDENT_AMBULATORY_CARE_PROVIDER_SITE_OTHER): Payer: Medicare Other

## 2011-02-04 ENCOUNTER — Encounter: Payer: Self-pay | Admitting: Gastroenterology

## 2011-02-04 DIAGNOSIS — E538 Deficiency of other specified B group vitamins: Secondary | ICD-10-CM

## 2011-02-04 NOTE — Progress Notes (Signed)
Summary: Change CT to without Contrast   Phone Note Other Incoming   Caller: [facility] Summary of Call: Rose with [facility] called to let Dr Jarold Motto know that patient's Creatinine is high at 1.7 and his BUN is 35. I have spoken to Dr Jarold Motto and he states that it is okay for patient to have a CT abd/pelvis WITHOUT IV contrast due to abnormal kidney function tests. Rose advised. She requests a new order for CT abd/pelvis without IV contrast. Initial call taken by: Lamona Curl CMA Duncan Dull),  January 29, 2011 9:37 AM

## 2011-02-07 ENCOUNTER — Encounter: Payer: Self-pay | Admitting: Internal Medicine

## 2011-02-07 ENCOUNTER — Encounter: Payer: Self-pay | Admitting: Cardiovascular Disease

## 2011-02-07 ENCOUNTER — Encounter (INDEPENDENT_AMBULATORY_CARE_PROVIDER_SITE_OTHER): Payer: Medicare Other

## 2011-02-07 DIAGNOSIS — I82619 Acute embolism and thrombosis of superficial veins of unspecified upper extremity: Secondary | ICD-10-CM

## 2011-02-07 DIAGNOSIS — Z7901 Long term (current) use of anticoagulants: Secondary | ICD-10-CM

## 2011-02-07 DIAGNOSIS — I743 Embolism and thrombosis of arteries of the lower extremities: Secondary | ICD-10-CM | POA: Insufficient documentation

## 2011-02-07 LAB — CONVERTED CEMR LAB: POC INR: 1.6

## 2011-02-12 ENCOUNTER — Encounter: Payer: Self-pay | Admitting: Gastroenterology

## 2011-02-13 NOTE — Progress Notes (Signed)
Summary: CT scan results   Phone Note Call from Patient Call back at Home Phone [phone] [facility]     Caller: Patient Call For: Dr. Jarold Motto Reason for Call: Lab or Test Results Summary of Call: Calling about his CT scan results Initial call taken by: Karna Christmas,  February 03, 2011 4:03 PM  Follow-up for Phone Call        advised CT normal and that if he had a cyst previouly it is resloved now.  Follow-up by: Harlow Mares CMA Duncan Dull),  February 03, 2011 4:12 PM

## 2011-02-13 NOTE — Assessment & Plan Note (Signed)
Summary: MONTHLY B-12//SCH'D W/PT  Nurse Visit   Allergies: 1)  ! * Actos  Medication Administration  Injection # 1:    Medication: Vit B12 1000 mcg    Diagnosis: B12 DEFICIENCY (ICD-266.2)    Route: IM    Site: R deltoid    Exp Date: 10/08/2012    Lot #: [id number]    Mfr: American Regent    Patient tolerated injection without complications    Given by: Christie Nottingham CMA (AAMA) (February 04, 2011 10:37 AM)  Orders Added: 1)  Vit B12 1000 mcg [J3420]

## 2011-02-13 NOTE — Medication Information (Signed)
Summary: Prior autho for Humulin/[facility]  Prior autho for Humulin/[facility] [facility]   Imported By: Sherian Rein 02/07/2011 09:55:50  _____________________________________________________________________  External Attachment:    Type:   Image     Comment:   External Document

## 2011-02-13 NOTE — Medication Information (Signed)
Summary: rov/tm   Anticoagulant Therapy  Managed by: Windell Hummingbird, RN PCP: Romero Belling, MD Supervising MD: Clifton James MD, Cristal Deer Indication 1: Other thrombosis (ICD-453.8) Lab Used: [facility] Site: [facility] INR POC 1.6 INR RANGE 2 - 3  Dietary changes: yes       Details: ate more dark greens  Health status changes: no    Bleeding/hemorrhagic complications: no    Recent/future hospitalizations: no    Any changes in medication regimen? no    Recent/future dental: no  Any missed doses?: no       Is patient compliant with meds? yes       Allergies: 1)  ! * Actos  Anticoagulation Management History:      The patient is taking warfarin and comes in today for a routine follow up visit.  Positive risk factors for bleeding include an age of 69 years or older and presence of serious comorbidities.  The bleeding index is 'intermediate risk'.  Positive CHADS2 values include History of CHF, History of HTN, and History of Diabetes.  Negative CHADS2 values include Age > 67 years old.  The start date was 06/26/2008.  His last INR was 1.8 ratio.  Anticoagulation responsible provider: Clifton James MD, Cristal Deer.  INR POC: 1.6.  Cuvette Lot#: [id number].  Exp: 12/2011.    Anticoagulation Management Assessment/Plan:      The patient's current anticoagulation dose is Warfarin sodium 2 mg tabs: Use as directed by Anticoagualtion Clinic.  The target INR is 2 - 3.  The next INR is due 02/21/2011.  Anticoagulation instructions were given to patient.  Results were reviewed/authorized by Windell Hummingbird, RN.  He was notified by Windell Hummingbird, RN.         Prior Anticoagulation Instructions: INR: 2.0  Continue current Coumadin regimen of 2 tablets on Monday, Wednesday, Friday, and Saturday, and 3 tablets on Sunday, Tuesday, and Thursday  Current Anticoagulation Instructions: INR 1.6 Take 3 tablets today and tomorrow.  Then, resume taking 2 tablets every day, except take 3 tablets on Sundays,  Tuesdays, and Thursdays. Recheck in 2 weeks.

## 2011-02-13 NOTE — Progress Notes (Signed)
Summary: PA-Humulin  Phone Note From Pharmacy   Summary of Call: PA-Humulin, form to Dr Everardo All to complete. Dagoberto Reef  January 03, 2011 2:58 PM Faxed to [phone], awaiting approval. Dagoberto Reef  January 09, 2011 3:05 PM Approved 11/18/10 - 12/08/11  Initial call taken by: Dagoberto Reef,  February 05, 2011 3:53 PM

## 2011-02-17 LAB — BASIC METABOLIC PANEL
BUN: 27 mg/dL — ABNORMAL HIGH (ref 6–23)
Chloride: 105 mEq/L (ref 96–112)
Glucose, Bld: 72 mg/dL (ref 70–99)
Potassium: 4 mEq/L (ref 3.5–5.1)

## 2011-02-17 LAB — CBC
HCT: 45.3 % (ref 39.0–52.0)
MCV: 95.8 fL (ref 78.0–100.0)
RDW: 14.6 % (ref 11.5–15.5)
WBC: 9.8 10*3/uL (ref 4.0–10.5)

## 2011-02-17 LAB — GLUCOSE, CAPILLARY
Glucose-Capillary: 100 mg/dL — ABNORMAL HIGH (ref 70–99)
Glucose-Capillary: 114 mg/dL — ABNORMAL HIGH (ref 70–99)
Glucose-Capillary: 148 mg/dL — ABNORMAL HIGH (ref 70–99)
Glucose-Capillary: 77 mg/dL (ref 70–99)

## 2011-02-18 LAB — URINALYSIS, ROUTINE W REFLEX MICROSCOPIC
Ketones, ur: NEGATIVE mg/dL
Nitrite: POSITIVE — AB
Specific Gravity, Urine: 1.022 (ref 1.005–1.030)
Urobilinogen, UA: 0.2 mg/dL (ref 0.0–1.0)
pH: 5.5 (ref 5.0–8.0)

## 2011-02-18 LAB — CBC
HCT: 48.7 % (ref 39.0–52.0)
Hemoglobin: 14.5 g/dL (ref 13.0–17.0)
MCH: 31 pg (ref 26.0–34.0)
MCHC: 33 g/dL (ref 30.0–36.0)
MCV: 96.6 fL (ref 78.0–100.0)
Platelets: 299 10*3/uL (ref 150–400)
RDW: 14.7 % (ref 11.5–15.5)
RDW: 14.9 % (ref 11.5–15.5)
WBC: 10.4 10*3/uL (ref 4.0–10.5)

## 2011-02-18 LAB — URINE MICROSCOPIC-ADD ON

## 2011-02-18 LAB — DIFFERENTIAL
Eosinophils Absolute: 0.3 10*3/uL (ref 0.0–0.7)
Eosinophils Relative: 3 % (ref 0–5)
Lymphocytes Relative: 29 % (ref 12–46)
Lymphs Abs: 3.1 10*3/uL (ref 0.7–4.0)
Monocytes Relative: 7 % (ref 3–12)

## 2011-02-18 LAB — URINE CULTURE: Culture  Setup Time: 201111290310

## 2011-02-18 LAB — COMPREHENSIVE METABOLIC PANEL
ALT: 64 U/L — ABNORMAL HIGH (ref 0–53)
AST: 67 U/L — ABNORMAL HIGH (ref 0–37)
Albumin: 3.6 g/dL (ref 3.5–5.2)
Calcium: 9.5 mg/dL (ref 8.4–10.5)
GFR calc Af Amer: 51 mL/min — ABNORMAL LOW (ref >60)
Sodium: 137 mEq/L (ref 135–145)
Total Protein: 7.9 g/dL (ref 6.0–8.3)

## 2011-02-18 LAB — GLUCOSE, CAPILLARY
Glucose-Capillary: 153 mg/dL — ABNORMAL HIGH (ref 70–99)
Glucose-Capillary: 279 mg/dL — ABNORMAL HIGH (ref 70–99)
Glucose-Capillary: 38 mg/dL — CL (ref 70–99)
Glucose-Capillary: 384 mg/dL — ABNORMAL HIGH (ref 70–99)
Glucose-Capillary: 41 mg/dL — CL (ref 70–99)
Glucose-Capillary: 44 mg/dL — CL (ref 70–99)
Glucose-Capillary: 541 mg/dL — ABNORMAL HIGH (ref 70–99)
Glucose-Capillary: 60 mg/dL — ABNORMAL LOW (ref 70–99)

## 2011-02-18 LAB — BASIC METABOLIC PANEL
BUN: 26 mg/dL — ABNORMAL HIGH (ref 6–23)
CO2: 26 mEq/L (ref 19–32)
Chloride: 104 mEq/L (ref 96–112)
Potassium: 3.7 mEq/L (ref 3.5–5.1)

## 2011-02-18 LAB — APTT: aPTT: 52 seconds — ABNORMAL HIGH (ref 24–37)

## 2011-02-18 LAB — CARDIAC PANEL(CRET KIN+CKTOT+MB+TROPI)
CK, MB: 3.7 ng/mL (ref 0.3–4.0)
CK, MB: 4.2 ng/mL — ABNORMAL HIGH (ref 0.3–4.0)
Relative Index: 3.6 — ABNORMAL HIGH (ref 0.0–2.5)
Relative Index: 4.2 — ABNORMAL HIGH (ref 0.0–2.5)
Relative Index: INVALID (ref 0.0–2.5)
Total CK: 100 U/L (ref 7–232)
Troponin I: 0.03 ng/mL (ref 0.00–0.06)

## 2011-02-18 LAB — POCT CARDIAC MARKERS
CKMB, poc: 2.9 ng/mL (ref 1.0–8.0)
CKMB, poc: 3 ng/mL (ref 1.0–8.0)
Troponin i, poc: <0.05 ng/mL (ref 0.00–0.09)
Troponin i, poc: <0.05 ng/mL (ref 0.00–0.09)

## 2011-02-18 LAB — HEMOGLOBIN A1C: Hgb A1c MFr Bld: 7.7 % — ABNORMAL HIGH (ref <5.7)

## 2011-02-18 NOTE — Letter (Signed)
Summary: Office Visit Letter  [facility]  [address]   [place], [place] [address]   Phone: [phone]  Fax: [phone]      February 12, 2011 MRN: [id number]   [name] [address] [facility] [place], [place]  [address]   Dear Mr. SAVARESE,   According to our records, it is time for you to schedule a follow-up office visit with [place].   At your convenience, please call [phone] (option #2)to schedule an office visit. If you have any questions, concerns, or feel that this letter is in error, we would appreciate your call.   Sincerely,   Sheryn Bison, M.D.   [facility] [phone]

## 2011-02-23 LAB — BASIC METABOLIC PANEL
BUN: 32 mg/dL — ABNORMAL HIGH (ref 6–23)
Calcium: 9.2 mg/dL (ref 8.4–10.5)
Creatinine, Ser: 1.88 mg/dL — ABNORMAL HIGH (ref 0.4–1.5)
GFR calc non Af Amer: 36 mL/min — ABNORMAL LOW (ref >60)
Glucose, Bld: 130 mg/dL — ABNORMAL HIGH (ref 70–99)

## 2011-02-23 LAB — GLUCOSE, CAPILLARY
Glucose-Capillary: 170 mg/dL — ABNORMAL HIGH (ref 70–99)
Glucose-Capillary: 296 mg/dL — ABNORMAL HIGH (ref 70–99)

## 2011-02-23 LAB — PROTIME-INR: INR: 1.73 — ABNORMAL HIGH (ref 0.00–1.49)

## 2011-02-23 LAB — APTT: aPTT: 38 seconds — ABNORMAL HIGH (ref 24–37)

## 2011-02-24 LAB — CBC
Hemoglobin: 15.9 g/dL (ref 13.0–17.0)
RDW: 14.6 % (ref 11.5–15.5)

## 2011-02-24 LAB — GLUCOSE, CAPILLARY
Glucose-Capillary: 170 mg/dL — ABNORMAL HIGH (ref 70–99)
Glucose-Capillary: 55 mg/dL — ABNORMAL LOW (ref 70–99)

## 2011-02-24 LAB — DIFFERENTIAL
Lymphocytes Relative: 24 % (ref 12–46)
Lymphs Abs: 3.4 10*3/uL (ref 0.7–4.0)
Monocytes Absolute: 0.8 10*3/uL (ref 0.1–1.0)
Monocytes Relative: 6 % (ref 3–12)
Neutro Abs: 9.6 10*3/uL — ABNORMAL HIGH (ref 1.7–7.7)

## 2011-02-24 LAB — COMPREHENSIVE METABOLIC PANEL
Albumin: 3.4 g/dL — ABNORMAL LOW (ref 3.5–5.2)
Alkaline Phosphatase: 55 U/L (ref 39–117)
BUN: 31 mg/dL — ABNORMAL HIGH (ref 6–23)
Calcium: 9.2 mg/dL (ref 8.4–10.5)
Creatinine, Ser: 1.8 mg/dL — ABNORMAL HIGH (ref 0.4–1.5)
Potassium: 4.5 mEq/L (ref 3.5–5.1)
Total Protein: 7.4 g/dL (ref 6.0–8.3)

## 2011-02-24 LAB — CK TOTAL AND CKMB (NOT AT ARMC)
CK, MB: 5.6 ng/mL — ABNORMAL HIGH (ref 0.3–4.0)
Relative Index: 5 — ABNORMAL HIGH (ref 0.0–2.5)
Relative Index: INVALID (ref 0.0–2.5)
Total CK: 113 U/L (ref 7–232)
Total CK: 97 U/L (ref 7–232)

## 2011-02-24 LAB — BRAIN NATRIURETIC PEPTIDE: Pro B Natriuretic peptide (BNP): 77 pg/mL (ref 0.0–100.0)

## 2011-02-24 LAB — TROPONIN I: Troponin I: 0.03 ng/mL (ref 0.00–0.06)

## 2011-02-26 ENCOUNTER — Ambulatory Visit (INDEPENDENT_AMBULATORY_CARE_PROVIDER_SITE_OTHER): Payer: Medicare Other | Admitting: *Deleted

## 2011-02-26 DIAGNOSIS — I743 Embolism and thrombosis of arteries of the lower extremities: Secondary | ICD-10-CM

## 2011-02-26 LAB — POCT INR: INR: 2.3

## 2011-02-26 NOTE — Patient Instructions (Signed)
INR 2.3 Cont taking 2 tabs daily except for 3 tabs on Sunday, Tuesday, and Thursday Recheck INR in 3 weeks

## 2011-02-27 ENCOUNTER — Other Ambulatory Visit: Payer: Self-pay | Admitting: Endocrinology

## 2011-03-07 ENCOUNTER — Other Ambulatory Visit: Payer: Self-pay

## 2011-03-07 ENCOUNTER — Other Ambulatory Visit: Payer: Self-pay | Admitting: Internal Medicine

## 2011-03-07 MED ORDER — WARFARIN SODIUM 2 MG PO TABS: 2.0000 mg | ORAL_TABLET | ORAL | Status: DC

## 2011-03-10 ENCOUNTER — Other Ambulatory Visit: Payer: Self-pay | Admitting: Emergency Medicine

## 2011-03-13 ENCOUNTER — Other Ambulatory Visit (INDEPENDENT_AMBULATORY_CARE_PROVIDER_SITE_OTHER): Payer: Medicare Other

## 2011-03-13 ENCOUNTER — Ambulatory Visit (INDEPENDENT_AMBULATORY_CARE_PROVIDER_SITE_OTHER): Payer: Medicare Other | Admitting: Gastroenterology

## 2011-03-13 ENCOUNTER — Encounter: Payer: Self-pay | Admitting: Gastroenterology

## 2011-03-13 VITALS — BP 132/64 | HR 62 | Ht 71.0 in | Wt 299.0 lb

## 2011-03-13 DIAGNOSIS — K862 Cyst of pancreas: Secondary | ICD-10-CM

## 2011-03-13 DIAGNOSIS — E538 Deficiency of other specified B group vitamins: Secondary | ICD-10-CM

## 2011-03-13 DIAGNOSIS — E119 Type 2 diabetes mellitus without complications: Secondary | ICD-10-CM

## 2011-03-13 DIAGNOSIS — K8020 Calculus of gallbladder without cholecystitis without obstruction: Secondary | ICD-10-CM

## 2011-03-13 DIAGNOSIS — R1013 Epigastric pain: Secondary | ICD-10-CM

## 2011-03-13 DIAGNOSIS — K863 Pseudocyst of pancreas: Secondary | ICD-10-CM

## 2011-03-13 LAB — BASIC METABOLIC PANEL
GFR: 53.14 mL/min — ABNORMAL LOW (ref >60.00)
Glucose, Bld: 344 mg/dL — ABNORMAL HIGH (ref 70–99)
Potassium: 5.7 mEq/L — ABNORMAL HIGH (ref 3.5–5.1)
Sodium: 138 mEq/L (ref 135–145)

## 2011-03-13 LAB — CBC WITH DIFFERENTIAL/PLATELET
Basophils Absolute: 0.1 10*3/uL (ref 0.0–0.1)
Basophils Relative: 0.5 % (ref 0.0–3.0)
Eosinophils Absolute: 0.5 10*3/uL (ref 0.0–0.7)
HCT: 42.9 % (ref 39.0–52.0)
Hemoglobin: 14.5 g/dL (ref 13.0–17.0)
Lymphs Abs: 3.1 10*3/uL (ref 0.7–4.0)
MCHC: 33.7 g/dL (ref 30.0–36.0)
MCV: 93.8 fl (ref 78.0–100.0)
Neutro Abs: 7 10*3/uL (ref 1.4–7.7)
RDW: 15.5 % — ABNORMAL HIGH (ref 11.5–14.6)

## 2011-03-13 LAB — HEPATIC FUNCTION PANEL
ALT: 48 U/L (ref 0–53)
AST: 47 U/L — ABNORMAL HIGH (ref 0–37)
Albumin: 3.4 g/dL — ABNORMAL LOW (ref 3.5–5.2)
Alkaline Phosphatase: 71 U/L (ref 39–117)
Bilirubin, Direct: 0.1 mg/dL (ref 0.0–0.3)
Total Bilirubin: 0.9 mg/dL (ref 0.3–1.2)
Total Protein: 7.2 g/dL (ref 6.0–8.3)

## 2011-03-13 LAB — SEDIMENTATION RATE: Sed Rate: 60 mm/h — ABNORMAL HIGH (ref 0–22)

## 2011-03-13 LAB — LIPASE: Lipase: 38 U/L (ref 11.0–59.0)

## 2011-03-13 LAB — TSH: TSH: 3.59 u[IU]/mL (ref 0.35–5.50)

## 2011-03-13 LAB — AMYLASE: Amylase: 136 U/L — ABNORMAL HIGH (ref 27–131)

## 2011-03-13 MED ORDER — OXYCODONE-ACETAMINOPHEN 5-325 MG PO TABS: 1.0000 | ORAL_TABLET | ORAL | Status: AC | PRN

## 2011-03-13 MED ORDER — CYANOCOBALAMIN 1000 MCG/ML IJ SOLN
1000.0000 ug | INTRAMUSCULAR | Status: AC
  Administered 2011-03-13: 1000 ug via INTRAMUSCULAR

## 2011-03-13 NOTE — Assessment & Plan Note (Signed)
He will probably need repeat CT scan of the abdomen if he will agree.

## 2011-03-13 NOTE — Progress Notes (Signed)
History of Present Illness:  This is a 70 year old obese Caucasian male with insulin-dependent diabetes, chronic alcoholism with known calcific pancreatitis, and recent relapsing pancreatitis which is fairly much resolved with discontinuation of alcohol over the last 4 months. He also has known gallstones which are calcified, and he currently is on Urso at 300 milligrams a day. He continues with occasional episodes of upper abdominal painand  mild constipation, gas and bloating. He takes daily PPI therapy and when necessary MiraLax. CT scan several months ago showed a 4 x 2 cm pseudocyst in the mid pancreas with calcific pancreatitis. There is been no evidence of biliary ductal dilatation, and his abnormal liver function tests have resolved. He denies current use of alcohol, cigarettes or NSAIDs. He also is on pancreatic extract with meals and varying doses of insulin.  I have reviewed this patient's present history, medical and surgical past history, allergies and medications.     ROS: The remainder of the general ROS is negative. He has massive obesity which limits his mobility. He also has multiple cardiovascular issues listed and reviewed his chart. He has dyspnea with exertion but denies cough or hemoptysis. He is a 50 year smoking area he has decreased visual acuity because of glaucoma, marked hyperlipidemia, and nonobstructive coronary artery disease with sick sinus syndrome.   Past Medical History  Diagnosis Date  . Diabetes mellitus   . Hypertension   . COPD (chronic obstructive pulmonary disease)     CPAP  . Renal insufficiency   . Hyperlipemia   . Anemia   . Other and unspecified coagulation defects   . Hypothyroidism   . CAD (coronary artery disease)   . Hyperkalemia   . Osteoarthritis   . Glaucoma   . Sick sinus syndrome   . Pancreatitis   . Obesity   . Constipation    Past Surgical History  Procedure Date  . Nasal septum surgery 1967  . Pacemaker placement 06/2010    [facility] Accent RF DR, Model [phone] ( Serial number [id number])  . Thromboembolectomy and four compartment fasciotomy 2009    reports that he quit smoking about 4 years ago. His smoking use included Cigarettes. He has a 100 pack-year smoking history. He has never used smokeless tobacco. He reports that he drinks about 1 - 1.5 ounces of alcohol per week. He reports that he does not use illicit drugs. family history includes Liver cancer in his mother.  There is no history of Colon cancer. Allergies  Allergen Reactions  . Pioglitazone     REACTION: Edema       Physical Exam: General well developed well nourished patient in no acute distress, appearing their stated age Eyes PERRLA, no icterus, fundoscopic exam per opthamologist Skin no lesions noted Neck supple, no adenopathy, no thyroid enlargement, no tenderness Chest clear to percussion and auscultation Heart no significant murmurs, gallops or rubs noted Abdomen no hepatosplenomegaly masses or tenderness, BS normal. Massively obese abdomen with some distention and mild tenderness in epigastric area without rebound. Bowel sounds are normal. . Extremities no acute joint lesions, edema, phlebitis or evidence of cellulitis. +1 edema noted without phlebitis. Neurologic patient oriented x 3, cranial nerves intact, no focal neurologic deficits noted. Psychological mental status normal and normal affect.  Assessment and plan: Current symptoms suggest relapse of his chronic calcific pancreatitis perhaps from passage of small calcified gallstones. 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 I have ordered CBC, liver profile, amylase and lipase, and scheduled him an appointment to see Dr. Luretha Murphy in surgery for consideration of laparoscopic cholecystectomy. I have renewed his oxycodone and have suggested nightly MiraLax for satiety constipation.  Also he has been urged to continue avoidance of all alcohol products. He is to continue his medications and insulin therapy as per Dr. Everardo All otherwise. Encounter Diagnosis  Name Primary?  . Gallstones Yes

## 2011-03-13 NOTE — Assessment & Plan Note (Signed)
Resume B12 replacement therapy. I suspect his B12 deficiency is associated with his chronic calcific pancreatitis.

## 2011-03-13 NOTE — Patient Instructions (Addendum)
Today you have watched the movie on gallbladder surgery. We are referring you to [facility] for consult, either [facility] or Dr Jarold Motto office will contact you about your appt.  We have refilled your pain medication and gave you an rx in office today.  Please go to the basement today for your labs.

## 2011-03-18 ENCOUNTER — Telehealth: Payer: Self-pay | Admitting: *Deleted

## 2011-03-18 NOTE — Telephone Encounter (Signed)
Advised pt of his appt with [facility]. He was given contact information and appt information.

## 2011-03-21 ENCOUNTER — Encounter: Payer: Medicare Other | Admitting: *Deleted

## 2011-03-24 LAB — URINALYSIS, ROUTINE W REFLEX MICROSCOPIC
Bilirubin Urine: NEGATIVE
Glucose, UA: NEGATIVE mg/dL
Hgb urine dipstick: NEGATIVE
Protein, ur: 30 mg/dL — AB
Specific Gravity, Urine: 1.019 (ref 1.005–1.030)

## 2011-03-24 LAB — BASIC METABOLIC PANEL
BUN: 32 mg/dL — ABNORMAL HIGH (ref 6–23)
BUN: 39 mg/dL — ABNORMAL HIGH (ref 6–23)
Calcium: 9.1 mg/dL (ref 8.4–10.5)
Chloride: 107 mEq/L (ref 96–112)
GFR calc Af Amer: 48 mL/min — ABNORMAL LOW (ref >60)
GFR calc Af Amer: 57 mL/min — ABNORMAL LOW (ref >60)
GFR calc Af Amer: 59 mL/min — ABNORMAL LOW (ref >60)
GFR calc non Af Amer: 39 mL/min — ABNORMAL LOW (ref >60)
GFR calc non Af Amer: 47 mL/min — ABNORMAL LOW (ref >60)
GFR calc non Af Amer: 49 mL/min — ABNORMAL LOW (ref >60)
Glucose, Bld: 250 mg/dL — ABNORMAL HIGH (ref 70–99)
Potassium: 3.9 mEq/L (ref 3.5–5.1)
Potassium: 5.6 mEq/L — ABNORMAL HIGH (ref 3.5–5.1)
Potassium: 6.9 mEq/L (ref 3.5–5.1)
Sodium: 132 mEq/L — ABNORMAL LOW (ref 135–145)

## 2011-03-24 LAB — CBC
HCT: 41.1 % (ref 39.0–52.0)
HCT: 43.4 % (ref 39.0–52.0)
Hemoglobin: 13.5 g/dL (ref 13.0–17.0)
MCV: 96.1 fL (ref 78.0–100.0)
Platelets: 268 10*3/uL (ref 150–400)
Platelets: 270 10*3/uL (ref 150–400)
RDW: 15.7 % — ABNORMAL HIGH (ref 11.5–15.5)
WBC: 8.7 10*3/uL (ref 4.0–10.5)

## 2011-03-24 LAB — POCT I-STAT, CHEM 8
BUN: 37 mg/dL — ABNORMAL HIGH (ref 6–23)
Calcium, Ion: 1.21 mmol/L (ref 1.12–1.32)
Chloride: 110 mEq/L (ref 96–112)
Creatinine, Ser: 1.4 mg/dL (ref 0.4–1.5)
Glucose, Bld: 246 mg/dL — ABNORMAL HIGH (ref 70–99)
Potassium: 6.8 mEq/L (ref 3.5–5.1)

## 2011-03-24 LAB — URINE CULTURE: Colony Count: NO GROWTH

## 2011-03-24 LAB — PROTIME-INR
INR: 1.7 — ABNORMAL HIGH (ref 0.00–1.49)
Prothrombin Time: 21.8 seconds — ABNORMAL HIGH (ref 11.6–15.2)

## 2011-03-24 LAB — POCT CARDIAC MARKERS
CKMB, poc: 2.9 ng/mL (ref 1.0–8.0)
Myoglobin, poc: 217 ng/mL (ref 12–200)
Troponin i, poc: <0.05 ng/mL (ref 0.00–0.09)

## 2011-03-24 LAB — COMPREHENSIVE METABOLIC PANEL
BUN: 32 mg/dL — ABNORMAL HIGH (ref 6–23)
CO2: 20 mEq/L (ref 19–32)
Calcium: 9 mg/dL (ref 8.4–10.5)
Chloride: 105 mEq/L (ref 96–112)
Creatinine, Ser: 1.66 mg/dL — ABNORMAL HIGH (ref 0.4–1.5)
GFR calc non Af Amer: 42 mL/min — ABNORMAL LOW (ref >60)
Total Bilirubin: 0.9 mg/dL (ref 0.3–1.2)

## 2011-03-24 LAB — TSH: TSH: 8.594 u[IU]/mL — ABNORMAL HIGH (ref 0.350–4.500)

## 2011-03-24 LAB — GLUCOSE, CAPILLARY
Glucose-Capillary: 119 mg/dL — ABNORMAL HIGH (ref 70–99)
Glucose-Capillary: 120 mg/dL — ABNORMAL HIGH (ref 70–99)
Glucose-Capillary: 186 mg/dL — ABNORMAL HIGH (ref 70–99)
Glucose-Capillary: 315 mg/dL — ABNORMAL HIGH (ref 70–99)
Glucose-Capillary: 364 mg/dL — ABNORMAL HIGH (ref 70–99)
Glucose-Capillary: 47 mg/dL — ABNORMAL LOW (ref 70–99)
Glucose-Capillary: 79 mg/dL (ref 70–99)
Glucose-Capillary: 98 mg/dL (ref 70–99)

## 2011-03-24 LAB — CK TOTAL AND CKMB (NOT AT ARMC)
CK, MB: 3.6 ng/mL (ref 0.3–4.0)
Total CK: 139 U/L (ref 7–232)

## 2011-03-24 LAB — DIFFERENTIAL
Basophils Relative: 1 % (ref 0–1)
Eosinophils Absolute: 0.3 10*3/uL (ref 0.0–0.7)
Eosinophils Relative: 2 % (ref 0–5)
Lymphs Abs: 3.5 10*3/uL (ref 0.7–4.0)
Monocytes Relative: 8 % (ref 3–12)
Neutrophils Relative %: 57 % (ref 43–77)

## 2011-03-24 LAB — CREATININE, URINE, RANDOM: Creatinine, Urine: 109.1 mg/dL

## 2011-03-24 LAB — NA AND K (SODIUM & POTASSIUM), RAND UR: Sodium, Ur: 99 mEq/L

## 2011-03-24 LAB — URINE MICROSCOPIC-ADD ON

## 2011-03-24 LAB — HEMOGLOBIN A1C
Hgb A1c MFr Bld: 7.6 % — ABNORMAL HIGH (ref 4.6–6.1)
Mean Plasma Glucose: 171 mg/dL

## 2011-03-24 LAB — TROPONIN I: Troponin I: 0.03 ng/mL (ref 0.00–0.06)

## 2011-04-04 ENCOUNTER — Ambulatory Visit (INDEPENDENT_AMBULATORY_CARE_PROVIDER_SITE_OTHER): Payer: Medicare Other | Admitting: *Deleted

## 2011-04-04 DIAGNOSIS — I743 Embolism and thrombosis of arteries of the lower extremities: Secondary | ICD-10-CM

## 2011-04-22 NOTE — Consult Note (Signed)
[name], [name]                 ACCOUNT NO.:  [id number]   MEDICAL RECORD NO.:  [id number]          PATIENT TYPE:  INP   LOCATION:  3316                         FACILITY:  [facility]   PHYSICIAN:  Alvin C. Lowell Guitar, M.D.  DATE OF BIRTH:  27-Sep-1941   DATE OF CONSULTATION:  DATE OF DISCHARGE:                                 CONSULTATION   REFERRING PHYSICIAN:  Balinda Quails, MD   I have asked by Dr. Madilyn Fireman to see this 70 year old male who was admitted  on July 20 with right lower extremity ischemia secondary to an embolus.  The patient underwent embolectomy after a diagnostic CT scan with 150 mL  of contrast material intravenously.  Admission serum creatinine was 1.5  mg/dL with potassium 5.5, and the patient had incidental bradycardia  with heart rate in the 40s.  Today, the patient's serum creatinine was  elevated at 2.03 mg/dL, potassium was 6.9 with a repeat of 7.0 and he  has had 250 mL of urine output.  We were asked to assist with management  of hyperkalemia and renal dysfunction.   PAST HISTORY:  Diabetes, hypertension, dyslipidemia, pneumonia in 2003  complicated by adult respiratory distress, obesity, hypoventilation  syndrome requires a CPAP machine, history of chronic bronchitis, and  glaucoma.   SOCIAL HISTORY:  He is married.  He is a former smoker of greater than  40 years and consumes alcoholic beverages on a limited basis.   FAMILY HISTORY:  Negative for kidney disease.   Current medications include Zinacef, Lopid, Ecotrin, Protonix, Lovaza,  vitamin D, Coumadin, heparin, NovoLog, Timoptic, niacin, vitamin C,  Crestor, presently Ventolin, Ultram.  The patient received one potassium  chloride tablet.   PHYSICAL EXAMINATION:  Blood pressure 114/44.  He is afebrile.  He is a pleasant, moderately obese Caucasian male in no obvious  distress.  HEENT:  Atraumatic, normocephalic.  Extraocular movements are intact.  LUNGS:  Clear.  HEART:  Regular rate and rhythm.  ABDOMEN:  Obese.  EXTREMITIES:  Right lower extremity postop changes of present.   ELECTROCARDIOGRAM:  Monitored junctional heart rate of 44.  No peaked T-  waves.   ASSESSMENT:  1. Acute renal failure secondary to contrast.  2. Chronic kidney disease possibly secondary to diabetes mellitus,      although urinalysis did not show any evidence of protein.  3. Hyperkalemia.  4. Bradycardia, junctional.   PLAN:  Kayexalate, intravenous bicarbonate support, keep off ACE  inhibitor.  We will recheck urinalysis and if protein present, we will  perform a 24-hour urine collection.      [name]. Lowell Guitar, M.D.  Electronically Signed     ACP/MEDQ  D:  06/27/2008  T:  06/28/2008  Job:  [id number]

## 2011-04-22 NOTE — Assessment & Plan Note (Signed)
OFFICE VISIT   DAIRON, PROCTER  DOB:  1941/06/14                                       09/07/2008  ZOXWR#:[id number]   The patient is status post right femoral thrombectomy and four  compartment fasciotomy carried out 06/26/2008 at [facility].  His fasciotomy incisions did open and these have been treated with  dressing changes.  He refused a VAC placement.   The incisions are granulating well without evidence of infection.  Right  foot well-perfused.   Will plan followup in 1 month with continued dressing changes.   Balinda Quails, M.D.  Electronically Signed   PGH/MEDQ  D:  09/07/2008  T:  09/09/2008  Job:  [id number]

## 2011-04-22 NOTE — Op Note (Signed)
NAMEGILLERMO, POCH                 ACCOUNT NO.:  [id number]   MEDICAL RECORD NO.:  [id number]          PATIENT TYPE:  INP   LOCATION:  3316                         FACILITY:  [facility]   PHYSICIAN:  Balinda Quails, M.D.    DATE OF BIRTH:  1940/12/30   DATE OF PROCEDURE:  DATE OF DISCHARGE:                               OPERATIVE REPORT   SURGEON:  Greg P. Madilyn Fireman, MD.   ASSISTANT:  Jerold Coombe, PA.   ANESTHESIA:  General endotracheal.   PREOPERATIVE DIAGNOSIS:  Ischemic right leg.   POSTOPERATIVE DIAGNOSIS:  Ischemic right leg.   PROCEDURES:  1. Right femoral thrombectomy.  2. A 4-compartment fasciotomy, right leg.   CLINICAL NOTE:  Mr. Maher Shon is a 70 year old male with a history of  diabetes and heavy tobacco use.  Developed right foot pain earlier  today, this became progressively more severe, and he presented to the  emergency department for evaluation.  He was referred from the emergency  department and evaluation revealed an ischemic right leg.  A CT  angiography revealed occlusion of the right superficial femoral artery.  He was brought to the operating room at this time for exploration and  revascularization of right lower extremity.   OPERATIVE PROCEDURE:  The patient was brought to the operating room in  stable condition.  Placed under general endotracheal anesthesia.  Right  leg prepped and draped in sterile fashion.   Longitudinal skin incision made through the right groin femoral pulse.  Dissection carried down through subcutaneous tissue with electrocautery.  The lymphatics divided.  Bridging veins ligated with 3-0 silk and  divided.  Right common femoral artery mobilized up to the inguinal  ligament encircled proximally with vessel loop.  There was a pulse  proximally in the right common femoral artery, mild-to-moderate plaque,  soft wall anteriorly.  Posterior plaque present.  Dissection carried  down to the origin of the profunda and superficial  femoral arteries,  which were freed and encircled with vessel loops.  The origin of the  right superficial femoral artery did reveal a plaque.   The patient administered 7000 units of heparin intravenously.  Adequate  circulation time permitted.  Longitudinal arteriotomy made in the right  common femoral artery.  The arteriotomy extended down into the origin of  the right superficial femoral artery.  There was thrombus present in the  distal right common femoral artery including the origin of the profunda  femoris and superficial femoral arteries.  Thrombus was removed  proximally under direct vision and then a 3 and 4 Fogarty catheter were  passed down the profunda femoris.  With further return of thrombus,  several passes made.  No further thrombus returned.  Good backbleeding  obtained.   The 3 and 4 Fogarty catheters were then passed down the course of right  superficial femoral artery.  Several passes made.  Further thrombus  removed from the right superficial femoral artery.  Continued passes  made until no further thrombus returned.   A patch angioplasty of the right common femoral artery was then carried  out with  a bovine pericardial patch using running 6-0 Prolene suture.  Clamps were then removed.  Excellent flow present.  Easily audible  signals present in the right dorsalis pedis and posterior tibial  arteries and the right foot pinked up readily.   The patient administered 50 mg protamine intravenously.  Adequate  hemostasis obtained.  The groin incision closed with a deep layer of  interrupted 2-0 Vicryl suture and subcutaneous running 2-0 Vicryl suture  in the superficial layer.  Skin closed with 4-0 Monocryl and Dermabond.   A 4-compartment right lower extremity fasciotomy was then carried out in  the calf.  A lateral incision made over the anterior and lateral  compartments and these were fasciotomized throughout the length of the  leg.  A distal right medial calf  incision was then made posterior to the  tibia.  The posterior superficial and deep compartments were  fasciotomized through this incision.   There was mild-to-moderate muscle bulging.  The muscle appeared quite  viable.  The skin was loosely closed with interrupted 3-0 nylon suture.  Sterile dressings applied.   No apparent complications.  The patient transferred to the recovery room  in stable condition.      Balinda Quails, M.D.  Electronically Signed     PGH/MEDQ  D:  06/26/2008  T:  06/27/2008  Job:  [id number]

## 2011-04-22 NOTE — Assessment & Plan Note (Signed)
[facility]                            CARDIOLOGY OFFICE NOTE   NAME:[name], [name] [place]FIELD                        MRN:          [id number]  DATE:07/19/2008                            DOB:          1941-12-08    PRIMARY CARE PHYSICIAN:  Sean A. Everardo All, MD.   CARDIOLOGY:  Bevelyn Buckles. Bensimhon, MD   This is a 70 year old married white male patient who presented to the  hospital with an ischemic right foot secondary to right femoral  thrombosis, status post right femoral thromboembolectomy and four-  compartment fasciotomy.  We were asked to see the patient because of  bradycardia in the setting of acute renal insufficiency and marked  hyperkalemia.  Once this was corrected.  The patient's bradycardia  abated and he was a asymptomatic with this.  We did not follow the  patient's rest of hospitalization.  He did have stress test back in May  2009, which was normal and he had a 2-D echo in the hospital on June 07, 2008, which showed normal LV function, EF 60%, aortic valve thickness  was mildly increased with normal aortic valve leaflet excursion, mean  transaortic valve gradient was 6 mmHg.  He had mild mitral annular  calcification and normal RV function.   Overall, the patient is doing well.  He denies any complaints and is  being followed closely by Dr. Everardo All and Dr. Madilyn Fireman.   CURRENT MEDICATIONS:  1. Lisinopril 20 mg daily.  2. Levothyroxine 50 mEq daily.  3. Omeprazole 20 mg daily.  4. Simvastatin 80 mg daily.  5. Several eye drops daily.  6. Gemfibrozil 600 mg daily.  7. Aspirin 325 daily.  8. Vitamin D 400 IU daily.  9. Vitamin C 1000 mg daily.  10.Fish oil daily.  11.Warfarin as directed.  12.Lantus 100 units daily.  13.Novolin 70/30__________ units daily.   PHYSICAL EXAMINATION:  GENERAL:  This is a pleasant 70 year old white  male in no acute distress.  VITAL SIGNS:  Blood pressure 144/74, pulse 74, and weight 282.  NECK:  Without JVD,  HJR, bruit, or thyroid enlargement.  LUNGS:  Clear in anterior, posterior, and lateral.  HEART:  Regular rate and rhythm at 74 beats per minute.  Normal S1 and  S2.  Positive S4.  No murmur, rub, bruit, thrill, or heave noted.  ABDOMEN:  Soft without organomegaly, masses, lesions, or abnormal  tenderness.  EXTREMITIES:  He has got bandages on his right lower leg.  He has got  positive distal pulses bilaterally.   EKG, normal sinus rhythm, nonspecific ST changes, and no acute change.   IMPRESSION:  1. Ischemic right foot secondary to right femoral thrombosis, status      post right femoral thromboembolectomy and four-compartment      fasciotomy.  2. Bradycardia in the setting of acute renal failure and hyperkalemia,      resolved with correction of this.  3. Insulin-dependent diabetes mellitus.  4. Obesity.  5. Hypertension.  6. Hyperlipidemia.  7. Gastroesophageal reflux disease.  8. Nonobstructive coronary artery disease.  9. History of liver lesions  on prior CT.  Plan outpatient workup.  10.Obstructive sleep apnea, on CPAP.  11.History of chronic bronchitis.  12.History of glaucoma.  13.Former smoker.  14.History of nephrolithiasis.   PLAN:  The patient is stable from a cardiac standpoint.  We will follow  him p.r.n. and he will follow up with Dr. Everardo All and Dr. Madilyn Fireman.       [name], PA-C  Electronically Signed      Luis Abed, MD, [facility]  Electronically Signed   ML/MedQ  DD: 07/19/2008  DT: 07/19/2008  Job #: [id number]

## 2011-04-22 NOTE — H&P (Signed)
NAMESANAV, REMER                 ACCOUNT NO.:  [id number]   MEDICAL RECORD NO.:  [id number]          PATIENT TYPE:  INP   LOCATION:  3316                         FACILITY:  [facility]   PHYSICIAN:  Balinda Quails, M.D.    DATE OF BIRTH:  25-Feb-1941   DATE OF ADMISSION:  06/26/2008  DATE OF DISCHARGE:                              HISTORY & PHYSICAL   ADMITTING PHYSICIAN:  Balinda Quails, MD   PRIMARY CARE PHYSICIAN:  Sean A. Everardo All, MD   CARDIOLOGIST:  Gerrit Friends. Dietrich Pates, MD, [facility]   ADMISSION DIAGNOSIS:  Ischemic right foot.   HISTORY:  Ariq Khamis is a 70 year old diabetic male with a history of  tobacco use who presented to the Emergency Department with a onset of  right foot pain the morning of June 26, 2008.  This had become more  progressive over the day and radiated up from the foot to the proximal  leg.  The patient had noted some tingling in his foot.  Also, had  weakness in his right foot.   Denied any recent trauma.  This was fairly sudden onset.  No alleviate,  no relieving factors.  No previous history of similar complaints.   PAST MEDICAL HISTORY:  1. Nonobstructive coronary artery disease.  2. Hypertension.  3. Hyperlipidemia.  4. Type 2 diabetes mellitus.  5. Pneumonia in 2003.  6. Nephrolithiasis.  7. Obstructive sleep apnea.  8. Chronic bronchitis.   ALLERGIES:  None known.   MEDICATIONS:  1. TriCor 145 mg daily.  2. Metformin 500 mg daily.  3. Gemfibrozil 600 mg daily.  4. Simvastatin 80 mg daily.  5. Lisinopril 20 mg daily.  6. Prilosec 20 mg daily.  7. Multivitamin 1 tablet daily.  8. Omega-3 fish oil 1 tablet daily.  9. Aspirin 325 mg daily.  10.Vitamin D 1 tablet daily.  11.Niacin 250 mg daily.  12.Vitamin C 1000 mg daily.   SOCIAL HISTORY:  The patient lives in [place] with his wife.  He is  retired.  He has a history of heavy tobacco use.  Quit approximately 1  year ago.  Does not consume alcohol and denies illicit drug use.   FAMILY  HISTORY:  Noncontributory.   REVIEW OF SYSTEMS:  The patient denies any recent anorexia, nausea, or  vomiting.  No recent chest pain.  He does have shortness of breath with  exertion.  No abdominal pain.  Regular bowel habits.  No dysuria or  frequency.   PHYSICAL EXAMINATION:  GENERAL:  Reveals a moderately obese 70 year old  male.  No acute distress.  Alert and oriented.  VITAL SIGNS:  Temperature 97.2, pulse is 52 per minute, respirations 16  per minute, and BP 113/53.  HEENT:  Mouth and throat are clear.  Normocephalic.  Extraocular  movements intact.  NECK:  Supple.  No thyromegaly or adenopathy.  CARDIOVASCULAR:  No carotid bruits.  Normal heart sounds without  murmurs.  Regular rate and rhythm.  No gallops or rubs.  CHEST:  Distant  breath sounds.  Equal air entry bilaterally.  No rales or rhonchi.  ABDOMEN:  Soft, nontender.  No masses or organomegaly.  Normal bowel  sounds.  No bruits.  LOWER EXTREMITIES:  2+ femoral pulses bilaterally.  No popliteal,  posterior tibial, or dorsalis pedis pulses bilaterally.  Left foot is  warm and well perfused.  Right foot reveals mild mottling, some  cyanosis, cool.  NEUROLOGIC:  Cranial nerves intact.  Strength is equal on the left.  Strength is normal.  The right leg, however, reveals weakness in the  foot.  Decreased sensation in the right foot.  SKIN: Warm, dry, and intact.   INVESTIGATIONS:  CT angiogram carried out.  Following evaluation reveals  occlusion of the right superficial femoral artery with thrombus.   ADMISSION DIAGNOSIS:  Ischemic right foot.   SECONDARY DIAGNOSES:  1. Nonobstructive coronary artery disease.  2. Type 2 diabetes.  3. Hypertension.  4. Hyperlipidemia.  5. History of pneumonia.  6. Nephrolithiasis.  7. Sleep apnea.   PLAN:  The patient will undergo emergency right leg thrombectomy and  possible bypass.      Balinda Quails, M.D.  Electronically Signed     PGH/MEDQ  D:  06/27/2008  T:   06/27/2008  Job:  [id number]   cc:   Gregary Signs A. Everardo All, MD  Gerrit Friends. Dietrich Pates, MD, [facility]

## 2011-04-22 NOTE — H&P (Signed)
NAMEFINNLEE, [name]                 ACCOUNT NO.:  [id number]   MEDICAL RECORD NO.:  [id number]          PATIENT TYPE:  INP   LOCATION:  3732                         FACILITY:  [facility]   PHYSICIAN:  Michiel Cowboy, MDDATE OF BIRTH:  1941/05/13   DATE OF ADMISSION:  12/27/2008  DATE OF DISCHARGE:                              HISTORY & PHYSICAL   PRIMARY CARE PHYSICIAN:  Sean A. Everardo All, MD at [facility].   CHIEF COMPLAINT:  Generalized weakness, some shortness of breath.   HISTORY OF PRESENT ILLNESS:  The patient is a 70 year old gentleman with  history of severe diabetes with high insulin requirement and  hypertension, OSA and recurrent hyperkalemia for unclear etiology at  this point and renal insufficiency.  The patient was starting to feel  not well for the past few days with generalized fatigue, worsening lower  extremity edema, feeling tired.  The patient presented to the emergency  department where he was found to have potassium of 6.9 and axillary  junctional rhythm and heart rate in 40-50s.  This has happened in the  past and resolved in the past with treatment of his hyperglycemia.  [facility] was called for admit with [place].  Of note,  otherwise, the patient endorses occasional chest pain which is sharp and  worsening shortness of breath recently.  Otherwise, no fevers, no  chills, no nausea, no vomiting, no diarrhea, no constipation, no urinary  difficulties.   Otherwise, the review of systems is unremarkable.   PAST MEDICAL HISTORY:  1. Diabetes requiring high doses of insulin.  2. GERD.  3. Glaucoma.  4. Hyperlipidemia.  5. Hypertension.  6. 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  7. History of recurrent hyperkalemia.  8. Renal insufficiency.  9. Obesity.  10.Nonobstructive coronary artery disease.  11.History of liver lesion __________ CT scan.  12.Obstructive sleep apnea.  13.Chronic bronchitis.  14.History of nephrolithiasis.   SOCIAL HISTORY:  The patient is a former smoker but quit two years ago,  drinks 2-3 beers per week.  Lives at home, has a wife who is with him at  the bedside.   FAMILY HISTORY:  Noncontributory.   ALLERGIES:  No known drug allergies.   MEDICATIONS:  1. Novolin 70/30 insulin 300 units in the a.m. with breakfast, 250      units at noon, 200 units q.h.s.  2. Warfarin 4 mg Monday, Wednesday, Friday and 6 mg all other days      which he is taking for his embolic event.  3. Fludrocortisone 0.2 mg Monday, Wednesday, Friday, the patient has      not taken his dose today.  4. Omeprazole 20 mg daily.  5. Lisinopril 20 mg daily.  Of note, the patient was recommended to      discontinue that, but currently still takes.  6. Gemfibrozil 600 daily.  7. Simvastatin 80.  8. Levothyroxine 50 mcg.  9. Aspirin 325.  10.Omega 3 fish oil.  11.Multivitamin.  12.Istalol eye drops in the morning.  13.Lumigan eye drops in  the afternoon.   PHYSICAL EXAMINATION:  VITAL SIGNS:  Temperature 97.7, blood pressure  140/56, pulse 45, respirations 14, saturating 100% on 2 liters.  GENERAL:  The patient appears to be in no acute distress currently.  HEENT:  Nontraumatic.  Mucous membranes moist.  NECK:  Severely obese, no jugular venous distention could be  appreciated.  LUNGS:  Distant breath sounds but no wheezes noted.  Mild crackles at  the bases.  HEART:  Slow but regular.  No murmurs could be appreciated.  ABDOMEN:  Diffusely obese, nontender, nondistended.  EXTREMITIES:  Lower extremities have 3+ edema.  There is a wound on the  dorsum of the extremity.  Seems to be healing fairly well.  NEUROLOGICAL:  Intact.   LABORATORY DATA:  White blood cell count 11, hemoglobin 15.  Sodium 132,  potassium 6.9, creatinine 1.45, bicarbonate 19, chloride 107, blood  glucose 250, BNP 412, INR 1.7.  EKG showed accelerated junction with  recent heart rate of  55.  There is a T wave inversion in lead 1.   Chest x-ray showing possible small nodule, but needs to be followed up,  but no pneumonia.   ASSESSMENT/PLAN:  1. The patient is a 70 year old gentleman with a recurrent      hyperkalemia.  Apparently, he had been started on fludrocortisone,      I am guessing for purposes of trying to keep his potassium down.      Somehow, he still continues to take Lisinopril.  Will probably need      to discuss him with Dr. Everardo All in the morning to see if there was      some etiology worked up for cause of his hyperkalemia.  We will      order UA to help assess for RTA.  While in junctional rhythm with      hyperkalemia and needing frequent labs, will admit to stepdown.      The patient already received Kayexalate and insulin and calcium.      Will also give bicarbonate or Lasix, albuterol nebulizer and give      his dose of insulin as the patient has high tolerance.  Probably      will need to repeat Kayexalate if no improvement.  Will check BNP      frequently.  Will likely benefit from renal consult in a.m. to try      to help figure out the cause.  BUN and creatinine and urine      potassium levels.  2. Diabetes requiring very high doses of insulin.  Will continue      Novolin and check home blood sugars q.4 h. for now, given that he      is requiring such high doses.  3. Chronic renal insufficiency.  Baseline creatinine about 1.7 or so.      The patient appears to be fluid up now.  Will give him IV Lasix and      continue full creatinine.  4. History of sleep apnea.  Will continue CPAP.  5. History of hypertension.  Hold Lisinopril given hyperkalemia.  6. History of embolic femoral artery occlusion.  He will continue      Coumadin per pharmacy.  7. History of GERD.  Continue Protonix.  8. Glaucoma.  Continue eye drops.  9. Prophylaxis.  Protonix and going to be on Coumadin.  10.Junctional rhythm, likely related to elevated potassium as it was       thought so in the past.  Hopefully, it will improve once potassium      is normalized.  The patient had workup for this in the past.  Will      cycle cardiac enzymes given slight chest pain and shortness of      breath.   Dr. Rene Paci to resume care in the morning.      Michiel Cowboy, MD  Electronically Signed     AVD/MEDQ  D:  12/27/2008  T:  12/28/2008  Job:  [id number]   cc:   Gregary Signs A. Everardo All, MD

## 2011-04-22 NOTE — Discharge Summary (Signed)
NAMECONSTANT, MANDEVILLE                 ACCOUNT NO.:  [id number]   MEDICAL RECORD NO.:  [id number]          PATIENT TYPE:  INP   LOCATION:  3732                         FACILITY:  [facility]   PHYSICIAN:  Valerie A. Felicity Coyer, MDDATE OF BIRTH:  June 21, 1941   DATE OF ADMISSION:  12/27/2008  DATE OF DISCHARGE:  12/29/2008                               DISCHARGE SUMMARY   DISCHARGE DIAGNOSES:  1. Weakness, multifactorial, resolved.  2. Hyperkalemia with junctional rhythm, resolved.  3. Persistent bradycardia, status post Cardiology evaluation, felt to      be asymptomatic.  4. Type-2 diabetes.  5. Abnormal chest x-ray with question of lung nodule.  Outpatient      follow up with primary MD.  6. Chronic renal insufficiency, chronic kidney disease stage III,      stable.  Discharge creatinine 1.75.  7. History of coronary artery disease.  8. Hypertension.  9. Obstructive sleep apnea with CPAP nightly and chronic dyspnea.  10.Hypothyroidism.  11.Chronic anticoagulation due to history of embolic femoral artery      occlusion.   DISCHARGE MEDICATIONS:  1. Novolin 70/30 300 units subcu a.m. plus 250 units subcu at noon      plus 200 units subcu at dinner.  2. Coumadin 4 mg on Monday, Wednesday, Friday and 6 mg on Tuesday,      Thursday, Saturday, and Sunday.  3. Fludrocortisone 0.2 mg on Monday, Wednesday, and Friday.  4. Omeprazole 20 mg daily.  5. Lopid 600 mg daily.  6. Zocor 80 mg nightly.  7. Aspirin 325 mg daily.  8. Omega-3 fish oil once daily.  9. Lumigan eye drops.  10.Levothyroxine 50 mcg once daily.   DISPOSITION:  The patient is discharged home in medically stable  condition without symptoms.  Hospital followup is scheduled with primary  care physician, Dr. Romero Belling to be arranged in the next 2 weeks.  The patient is instructed to call for an appointment.  Also, with  cardiologist Dr. Gala Romney on January 09, 2009, at 12:15 p.m.  The  patient is instructed that he is to  discontinue his previously  prescribed lisinopril until further follow up with Cardiology.   HOSPITAL COURSE BY PROBLEM:  Weakness.  The patient is a 70 year old  gentleman with severe insulin-resistant diabetes, who presented to the  emergency room with complaints of weakness.  He was found to be  hyperkalemic with a potassium of 6.9 and a junctional rhythm, which was  felt to be the primary etiology for his symptoms.  He was treated  aggressively medically for reduction of his potassium level, which came  down to 5.8 on the following morning and improvement in symptoms.  He  persisted with mild bradycardia in the 40s despite resolution of his  hyperkalemia and due to concerns of this as possible etiology of the  symptoms he was seen in consultation by Cardiology, Dr. Myrtis Ser on behalf  of Dr. Gala Romney.  He felt that he had a history of asymptomatic  junctional bradycardia in the past, which was unchanged at this time and  not likely because of any abnormalities  or symptoms, there is no further  intervention recommended or treatment, specifically for treatment of his  bradycardia and as there are no acute medical issues to be addressed at  this time.  He was felt stable for discharge to home.  However, he will  remain on this high dose of insulin therapy as prior to admission as  well as other medical management.  He will remain off of his ACE  inhibitor and continue to further follow up with primary care and with  Cardiology directing use of his ACE inhibitor due to the presence of  hyperkalemia.  Around 30 minutes on day of discharge were necessary for  planning of discharge and care.      Valerie A. Felicity Coyer, MD  Electronically Signed     VAL/MEDQ  D:  02/01/2009  T:  02/02/2009  Job:  [id number]

## 2011-04-22 NOTE — H&P (Signed)
NAMECURRAN, LENDERMAN NO.:  [id number]   MEDICAL RECORD NO.:  [id number]          PATIENT TYPE:  EMS   LOCATION:  MAJO                         FACILITY:  [facility]   PHYSICIAN:  Audery Amel, MD    DATE OF BIRTH:  1941-11-02   DATE OF ADMISSION:  05/07/2008  DATE OF DISCHARGE:                              HISTORY & PHYSICAL   PRIMARY CARDIOLOGIST:  Arturo Morton. Riley Kill, MD, [facility], per the patient's  request.   PRIMARY MEDICAL DOCTOR:  Sean A. Everardo All, MD   CHIEF COMPLAINT:  Shortness of breath and chest pain.   HISTORY OF PRESENT ILLNESS:  Mr. [name] is a 70 year old white male with  history of nonobstructive CAD, COPD, diabetes, hypertension who presents  to the emergency department for further evaluation of chest pain.  The  patient states the symptoms started earlier this evening after caring a  bookcase to his car.  He stated initially he noticed dyspnea followed by  a dull chest tightness.  He states that the symptoms lasted  approximately one hour.  Therefore, he presented to the emergency room  further evaluation.  The chest discomfort resolved spontaneously in the  ED.  However, his mild symptoms of dyspnea persisted.  Of note, the  patient last had a cardiac catheterization in 2003 which revealed  nonobstructive coronary artery disease.  He had a stress test earlier  this week as an outpatient by [facility].  The results of which  are unknown at this time.  The patient does report significant orthopnea  and frequently prefers to sleep in a recliner with CPAP therapy for  comfort.  He has New York Heart Association class II to III symptoms,  reporting that he is only able to walk approximately 50 yards without  stopping to rest.  On my initial evaluation in the emergency department,  the patient was chest pain free and otherwise hemodynamically stable.  EKG revealed junctional rhythm with no evidence of acute injury.  His  initial cardiac biomarkers  by point of care testing were negative times  one.  His chest x-ray was consistent with the changes of chronic  obstructive pulmonary disease, however, there was no acute infiltrate  suggestive of pneumonia.   PAST MEDICAL HISTORY:  1. Obstructive CAD.  2. Diabetes.  3. Hypertension.  4. Hyperlipidemia.  5. History of Legionella pneumonia in 2003, subsequently developing      ARDS, requiring prolonged intubation.  6. Nephrolithiasis.  7. Obstructive sleep apnea chronically on CPAP therapy.   ALLERGIES:  NO KNOWN DRUG ALLERGIES.   MEDICATIONS:  1. Lantus 320, total units daily.  2. Humalog 385, total units daily.  3. Lisinopril 20 mg daily.  4. Levothyroxine 50 mcg daily.  5. Omeprazole 20 mg daily.  6. Simvastatin 80 mg daily.  7. Gemfibrozil 600 mg once daily.  8. Niacin 12-108 mg daily.  9. Aspirin 325 mg daily.  10.Fish oil 1000 mg daily.   SOCIAL HISTORY:  The patient lives in [place] with his wife.  He is  retired.  He has a heavy history of smoking.  However, quit  approximately one year ago.  He denies any alcohol or illicit substance.   FAMILY HISTORY:  Noncontributory.   REVIEW OF SYSTEMS:  The patient denies any recent fevers, chills, coughs  or sick contacts.  Otherwise, the review of systems is negative except  as documented in the HPI.   PHYSICAL EXAMINATION:  VITAL SIGNS:  Blood pressure 111/65, heart rate  is 73, O2 sats are 99% on 2 liters nasal cannula.  GENERAL:  The patient is obese, alert and oriented.  No acute stress,  closely conversant.  HEENT:  Normocephalic, atraumatic, EOMI, PERL, nares patent.  Oropharynx  clear without erythema or exudate.  NECK:  Supple, full range of motion, mild JVD.  There is no palpable  thyromegaly or lymphadenopathy.  Carotid upstrokes are equal and  symmetric bilaterally.  No audible bruits.  CHEST:  Reveals diminished breath sounds, equal bilaterally with  occasional end-expiratory wheezing.  CARDIOVASCULAR:   Distant S1-S2 with no audible murmurs, rubs or gallops.  PMI is not palpable.  Peripheral pulses are trace in the distal  extremities.  SKIN:  Warm, dry and intact.  ABDOMEN:  Obese, soft, nontender, nondistended, positive bowel sounds.  No hepatosplenomegaly.  EXTREMITIES:  Reveal 1+ edema and no evidence of facial ulceration.  MUSCULOSKELETAL:  No joint deformity or effusions noted.  NEUROLOGIC:  Grossly nonfocal.   STUDIES:  Chest x-ray consistent with changes of chronic obstructive  pulmonary disease.   EKG junctional rhythm with no evidence of acute injury.   Labs are pending.   IMPRESSION:  1. Chest chest pain rule out MI.  2. Accelerated junctional rhythm.  3. Diabetes.  4. Hypertension.  5. Hyperlipidemia.  6. COPD.  7. Obstructive sleep apnea.   PLAN:  Will admit the patient to a telemetry bed for further evaluation  and monitoring.  We will cycle his cardiac biomarkers to rule out MI.  If the patient rules out, he recently had a nuclear stress test in the  [facility] outpatient clinic.  Therefore, we will need to obtain these  records to determine the need for additional risk stratification.  His  EKG did reveal a junctional rhythm, I am currently awaiting the results  of a serum chemistry to rule out any electrolyte abnormalities.  The  patient's presentation overall is more consistent with that of a COPD  exacerbation.  On examination, the patient does have occasional end  expiratory wheezing, and I will start albuterol nebs q.4 h as needed.  He has not any fevers, cough with productive sputum, therefore, I did  not feel antibiotics were warranted at this time.  He does have mild  crackles of the bases, and I will treat him with Lasix 40 mg IV times  one.  I will  continue the rest of his home medication regimen as listed above.  The  patient is a diabetic and requires quite a significant amount of  insulin.  We will continue this throughout his hospitalization.   Consider a transthoracic echocardiogram in a.m. to assess his left  ventricular structure function.      Audery Amel, MD  Electronically Signed     SHG/MEDQ  D:  05/07/2008  T:  05/07/2008  Job:  [id number]

## 2011-04-22 NOTE — Discharge Summary (Signed)
[name], [name]                 ACCOUNT NO.:  [id number]   MEDICAL RECORD NO.:  [id number]          PATIENT TYPE:  INP   LOCATION:  2039                         FACILITY:  [facility]   PHYSICIAN:  Balinda Quails, M.D.    DATE OF BIRTH:  March 03, 1941   DATE OF ADMISSION:  06/26/2008  DATE OF DISCHARGE:  07/02/2008                               DISCHARGE SUMMARY   ADMISSION DIAGNOSES:  1. Ischemic right foot.  2. Mild hyperkalemia.  3. Leukocytosis with recent Escherichia coli urinary tract infection,      on Levaquin.   DISCHARGE/SECONDARY DIAGNOSES:  1. 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  2. Postoperative hyperkalemia at 7.0.  3. Postoperative acute renal failure secondary to contrast with      underlying chronic kidney disease.  4. Postoperative bradycardia and junctional rhythm, improved with      reversal of hyperkalemia.  5. Diabetes mellitus type 2, on insulin, followed by Dr. Everardo All.  6. Obesity.  7. Gastroesophageal reflux disease.  8. Hyperlipidemia.  9. Hypertension.  10.Nonobstructive coronary artery disease.  11.History of liver lesions on a prior CT scan with planned outpatient      workup.  12.Obstructive sleep apnea with intermittent use of continuous      positive airway pressure.  13.History of Legionella pneumonia.  14.History of chronic bronchitis.  15.History of glaucoma.  16.History of adult respiratory distress secondary to pneumonia in      2003.  17.A former smoker.  18.Drug allergies.  19.History of nephrolithiasis.   PROCEDURES:  June 26, 2008, right femoral thrombectomy with four  compartment fasciotomy, right leg by Dr. Balinda Quails.   CONSULTS:  1. Nephrology, Dr. Casimiro Needle for acute renal failure and      hyperkalemia.  2. [facility] for bradycardia and junctional rhythm.  3. Infectious Disease, Dr. Paulette Blanch Dam.  4. Pharmacy for anticoagulation dosing.   DIAGNOSTICS:  1. June 26, 2008, CT angiogram of abdominal aorta and bilateral lower      extremities with contrast showing embolus or thrombus including the      distal right common femoral artery, proximal profunda femoris and      proximal SFA, collateral reconstitution of the right popliteal      artery with probable disease and posterior tibial runoff, coronary      aortic and lower extremity arterial calcification with no      significant left lower extremity arterial occlusive disease, low-      attenuation foci in the liver incompletely evaluated.  It was felt      that they may represent areas of focal fatty infiltration and      neoplasm cannot be competently excluded.  Dedicated liver MR      suggested for more definitive evaluation.  2. June 26, 2008, ABIs of 1.18 on the right and 1.23 on the left, and      venous duplex of the right leg showing no obvious evidence of  DVT,      superficial thrombus, or Baker cyst.  3. June 29, 2008, transesophageal echocardiogram with findings showing      left ventricular systolic function normal, left ventricular      ejection fraction estimated 55% to 65%, no left ventricular      regional wall motion abnormalities with mild focal basal septal      hypertrophy.  The aortic valve was mildly calcified.  There was      mild aortic valvular regurgitation.  There was a mild atheroma of      the descending aorta and the left atrium was mildly dilated.  There      was no left atrial appendage thrombus identified.  There was an      atrial septal aneurysm and a prominent lipomatous hypertrophy of      the atrial septum was noted with no vegetation.  This was read by      Olga Millers, whose note said that he will review this with his      colleagues to decide whether the patient will need a cardiac MR to      rule out right atrial mass.   BRIEF HISTORY:  [name] is a 70 year old Caucasian male with  history  of diabetes and tobacco use, who presented to [facility]  Emergency Department with onset of right foot pain, starting around at  9:30 on June 26, 2008, that had become more progressive over the day and  radiating up to the foot to the proximal leg.  He also has some tingling  in his foot and weakness in his right foot.  He denied any recent  trauma.  There were no alleviating factors.  He had no history of  similar complaints or history of atrial fibrillation.  He ultimately  underwent a venous duplex to his right leg, which was negative for DVT  and a CTA revealing occlusion of his right superficial femoral artery  with thrombus.  He was evaluated by vascular surgeon, Dr. Balinda Quails,  who felt he should undergo emergent right leg thrombectomy and a  possible bypass.   HOSPITAL COURSE:  [name] was admitted to [facility] on June 26, 2008.  He underwent the previously mentioned procedures.  Postoperatively, he was transferred to step-down unit 3300 for the first  few days.  Of note, his preoperative potassium was around 5.5 with his  followup potassium the morning after surgery was 6.9.  This was repeated  and was still right at 7.  He was treated with Kayexalate and we did  request a nephrology consult, as he had also had a bump is his  creatinine to 2.03, where his baseline creatinine seem to run around 1.5-  1.7.  In addition to the Kayexalate, he was treated with Lasix and  sodium bicarbonate as well as calcium.  During the time frame of his  marked hyperkalemia, he became more bradycardic, primarily junctional  rhythm with his heart rate as low as low 40s.  It was unclear whether he  was asymptomatic, as he reported feeling dizzy and nauseated after  surgery, but after consulting with Cardiology, it was felt that he would  need no further treatment unless symptoms persist after his hyperkalemia  was corrected.  Following normalization of his  potassium, he did return  to a sinus rhythm with dizziness and nausea improving.  In regards to  his acute ischemic right leg, it was unclear the  sources of possible  emboli.  Therefore, he was started on IV heparin and Coumadin therapy.  Cardiology did do a TEE during the hospitalization with results as  discussed above.  He is also in the process of a hypercoagulable workup  that was prompted by Infectious Disease with some results still pending.  Please see laboratory section for known results at the time of this  dictation.  At the time of this dictation, his INRs now appear  therapeutic at 2.1 and his heparin has been discontinued and he is  planned for discharge.  In regards to his hyperkalemia and acute renal  failure believed secondary to the contrast, these have also shown  improvement with labs on June 30, 2008, showing a potassium of 3.6 and  creatinine of 1.73 which is around his baseline.  He has had excellent  urine output.  Since the sodium bicarbonate, Lasix, and potassium were  used to treat more of an acute problem, these will not be continued  postoperatively unless that is indicated, otherwise on an outpatient  basis.   During his hospitalization, [name] was seen by Infectious Disease.  This was presumably called in by his family physician, Dr. Everardo All, as  prior to admission, he had been placed on Levaquin for uncertain source  of infection; however, it does appear that he did have a urinary tract  infection tested positive for E. coli by report.  Subjectively, he had  reported some fevers and chills.  At this point, Infectious Disease has  ordered an array of tests.  Again, see the laboratory section for  results.  He also had a negative PPD.  He for a short time was placed on  IV Rocephin, but in the end, he is placed back on p.o. Levaquin, which  he was on at home for his UTI and has been instructed to continue this  for one more week.  They were seen by Dr.  Daiva Eves who would like to see  him on an outpatient basis to follow up his anticoagulation workup and  to also schedule an MRI of the liver with contrast when his GFR is  improved, as Dr. Daiva Eves was concerned that he may have an underlying  unknown malignancy.   On July 02, 2008, [name] was felt appropriate for discharge home.  His right foot remained well perfused.  He had movement and sensation.  His INR was therapeutic.  Based on his labs on June 30, 2008, his renal  function and potassium level had improved.  His pain was controlled on  medications.  His incisions were healing without signs of infection.  He  was having no fevers.  He did have a slightly elevated white count at  12.9.  His vitals were stable.  Blood sugars were not ideally controlled  with most ranging over 150 and one up to 400, which we managed with  Lantus and NovoLog.  He was on a more aggressive therapy at home and we  had slowly been titrating up to this dose, but he plans to resume his  home insulin dosages upon discharge.  Currently, [name] is in stable  and improved condition.   LABORATORY DATA:  PT/INR, July 02, 2008, 24.8 and 2.1 respectively with  white count of 12.9, hemoglobin 11.2, hematocrit 33, and platelet count  243.  Labs, on or before June 30, 2008, include an RPR, which was  nonreactive.  Protein electrophoresis showed a total protein of 6.8,  serum albumin low at 46.6, alpha-1 and alpha-2 elevated at 5.6 and 14.0  respectively.  Beta serum and beta-2 normal at 6.2 and 3.6 respectively  and gamma globulin serum elevated at 24, and M-spike was not detected.  Urine culture showed no growth.  ANA was negative.  Blood cultures x2  showed no growth to date.  Cardiolipin antibodies and antiphospholipid  panel were still pending at the time of this dictation.   DISCHARGE MEDICATIONS:  1. Levaquin 500 mg p.o. daily through July 06, 2008.  2. Humalog insulin 100 units each morning, 110 units  at lunch, 175      units at supper.  3. Lantus 160 units subcutaneously b.i.d.  4. Lisinopril 20 mg daily.  5. Levothyroxine 50 mcg daily.  6. Omeprazole 20 mg daily.  7. Simvastatin 80 mg daily.  8. Istalol 1 drop in both eyes q.a.m.  9. Lumigan 1 drop in both eyes q.p.m.  10.Gemfibrozil 600 mg daily.  11.Niacin 250 mg daily.  12.Aspirin 325 mg daily.  13.Vitamin D 400 international units daily.  14.Vitamin C 1000 mg daily.  15.Fish oil capsule daily.  16.Percocet 5/325 mg 1 tablet q.4 h. p.r.n. pain.  17.Coumadin 2 mg 2 tablets p.o. q.p.m. for the next 2 evenings, then      his INR will be checked at [facility].   DISCHARGE INSTRUCTIONS/FOLLOWUP:  1. He will have a nurse visit at the [facility] office approximately in 1      week to remove his sutures.  He will see Dr. Madilyn Fireman in 2 or 3 weeks      with ABIs and our office will contact him regarding specific      appointment date and time.  He should call sooner if he develops      increasing pain or signs of infection at his incision site.  In the      meantime, he may shower and clean the incision gently with soap and      water, and should avoid driving or heavy lifting for the next 2-3      weeks.  2. Dr. Paulette Blanch Dam wants to see him in 1-2 weeks at the [facility].  The patient was      instructed to call [phone] to schedule followup.  Dr. Daiva Eves      would like to follow his anticoagulation results and discuss      scheduling an MR to further evaluate his liver as discussed above.  3. The patient was instructed to contact Dr. Everardo All for a 1-2 week      followup appointment, so his white blood count, potassium, renal      function, blood glucose levels, and other medical comorbidities can      be followed up.  4. He is to have an appointment at the [facility] on      Tuesday, July 04, 2008.  Their physician assistant has been      notified and she reports her  office will contact him regarding      appointment time.  He may follow up with his cardiologist as      directed.      Jerold Coombe, P.A.       ______________________________  Demetrius Charity. Liliane Bade, M.D.    AWZ/MEDQ  D:  07/02/2008  T:  07/03/2008  Job:  [id number]   cc:   Acey Lav, MD  Balinda Quails, M.D.  Barbaraann Share, MD,FCCP  Sean A. Everardo All, MD

## 2011-04-22 NOTE — Assessment & Plan Note (Signed)
HEALTHCARE                            CARDIOLOGY OFFICE NOTE   NAME:[name], [name]                        MRN:          [id number]  DATE:01/09/2009                            DOB:          21-Jul-1941    PRIMARY CARDIOLOGIST:  Bevelyn Buckles. Bensimhon, MD   PRIMARY CARE PHYSICIAN:  Sean A. Everardo All, MD   This is an 70 year old married white male patient who was recently  admitted to the hospital with persistent bradycardia in the setting of  hyperkalemia with a potassium of 6.9 and renal insufficiency.  He was  also found to have an elevated TSH of 8.59.  The patient has some  junctional rhythm in the 40s and feels fatigued and weak when he has got  a slow heart rate.  He was given Kayexalate and a diuretic, and had  normal sinus rhythm once his potassium was corrected.  He has been  taking fludrocortisone 0.2 mg Monday, Wednesday, and Friday, as well for  his renal insufficiency and lowering his potassium.   Today, in the office, he states he still feels weak and fatigued and  states his heart rate sometimes dip down into the 40s, but usually is in  the 50s at home.  He has not had any dizziness or presyncope.  He saw  Dr. Everardo All this morning, who drew labs and when we just received them  some back, his potassium is back up to 5.5, creatinine is 1.8, but that  is actually down from the hospital where it was 3.6.  His glucose is  high at 317, BUN 45, and BNP is 371.   CURRENT MEDICATIONS:  1. Fludrocortisone 0.2 mg Monday, Wednesday, and Friday.  2. Novolin 70/30 600 units daily.  3. Levothyroxine 50 mcg daily.  4. Omeprazole 20 mg daily.  5. Simvastatin 80 mg daily.  6. Istalol eyedrops daily.  7. Lumigan eyedrops daily.  8. Gemfibrozil 600 mg daily.  9. Aspirin 325 daily.  10.Vitamin D 400 International Units daily.  11.Vitamin C 1000 mg daily.  12.Fish oil daily.  13.Warfarin as directed.   PHYSICAL EXAMINATION:  GENERAL:  This is a pleasant  70 year old white  male in no acute distress.  VITAL SIGNS:  Blood pressure is 124/50, pulse 52, and weight 312.  NECK:  Without JVD, HJR, bruit, or thyroid enlargement.  LUNGS:  Decreased breath sounds, but clear anterior, posterior, and  lateral.  HEART:  Regular rate and rhythm at 50 beats per minute.  Normal S1 and  S2.  Distant heart sounds.  No murmur, rub, bruit, thrill, or heave  noted.  ABDOMEN:  Obese.  Normoactive bowel sounds are heard throughout.  EXTREMITIES:  Bilateral +2 to 3 edema up to his knees, decreased distal  pulses.   EKG; junctional bradycardia at 48 beats per minute, nonspecific ST-T  changes, no P-wave seen.   IMPRESSION:  1. 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  2. Diastolic heart failure, BNP up to 371.  3. History of hypothyroidism.  TSH in the hospital was  elevated at      8.3, to be monitored by Dr. Everardo All  4. History of ischemic right foot secondary to right femoral      thrombosis status post right femoral thromboembolectomy and four      compartment fasciotomy.  5. Renal insufficiency.  6. Insulin-dependent diabetes mellitus.  7. Obesity.  8. Hypertension.  9. Hyperlipidemia.  10.Gastroesophageal reflux disease.  11.Nonobstructive coronary artery disease on catheterization in July      2003.  12.History of liver lesions on prior CT, plan outpatient workup.  13.Obstructive sleep apnea, on continuous positive airway pressure.  14.History of chronic bronchitis.  15.History of glaucoma.  16.Former smoker.  17.History of nephrolithiasis.   PLAN:  I discussed this patient with Dr. Willa Rough.  He recommends  Kayexalate 30 g daily.  We will check a BMET on Thursday and Dr.  Gala Romney will follow up with that on Friday.  I have also added Lasix  40 mg daily because of his edema and elevated BNP.  We have asked him to  contact his eye doctor to see if he can come off the rate-lowering  eyedrops as  well.  He states he does not use any salt substitute.  I  have reminded him to avoid both salt and salt substitute and he is to  follow a 2 g sodium diet a day, and he will see with Dr. Gala Romney in 2  weeks or sooner.     [name], PA-C  Electronically Signed      Luis Abed, MD, [facility]  Electronically Signed   ML/MedQ  DD: 01/09/2009  DT: 01/10/2009  Job #: [id number]   cc:   Chalmers Guest, M.D.

## 2011-04-22 NOTE — Assessment & Plan Note (Signed)
[facility]                            CARDIOLOGY OFFICE NOTE   NAME:Olesen, BYNUM MCCULLARS                        MRN:          [id number]  DATE:01/25/2009                            DOB:          January 05, 1941    PRIMARY CARE PHYSICIAN:  Sean A. Everardo All, MD   INTERVAL HISTORY:  Mr. Minchey is a 70 year old male with a history of  obesity, hypertension, diabetes, diastolic heart failure, and peripheral  arterial disease.   I have seen him previously in the hospital when he developed junctional  bradycardia in the setting of renal insufficiency and hyperkalemia.  He  subsequently followed up with [name] in our clinic last month and  was once again found to have junctional bradycardia.  His potassium was  checked and once again it was 5.5.  He was started on Kayexalate.  He  was brought today for routine followup.  There was also started on Lasix  for significant fluid overload.   He returns today.  He says overall his heart feels better.  He no longer  feels fatigued and lightheadedness from his bradycardia.  He says it is  quite easy for him to tell when his heart rate is slow.  He has been  very careful about trying to watch foods which are high in potassium and  avoid these.  He had a marked response to the Lasix, but says that he  was urinating so much that could no longer tolerate it and has actually  stopped it.  He denies any orthopnea and no PND.  He sleeps with a CPAP  though not always compliant.  He has not had any chest pain.  He has  chronic dyspnea.  His really main complaint today though is sinus  congestion.   PHYSICAL EXAMINATION:  GENERAL:  He ambulates around the clinic slowly  without any respiratory difficulty.  VITAL SIGNS:  Blood pressure is 150/76, heart rate is 72.  HEENT:  Normal.  NECK:  Supple and thick.  I am unable to evaluate his JVD.  Carotids are  2+ bilaterally without bruits.  No appreciable lymphadenopathy.  CARDIAC:  PMI is nonpalpable.  He is regular with distant heart sounds.  No obvious murmurs.  LUNGS:  Clear with decreased breath sounds throughout.  No wheezes or  rales.  ABDOMEN:  Markedly obese, nontender, nondistended.  No bruits.  I am  unable to examine for any hepatosplenomegaly.  EXTREMITIES:  Edema 2-3+ bilaterally.  He has a healing surgical scar in  his right shin from his previous thromboembolectomy and fasciotomy.  No  rash.  NEUROLOGIC:  Alert and oriented x3.  Cranial nerves II through XII are  intact.  Moves all 4 extremities without difficulty.  Affect is  pleasant.   EKG shows normal sinus rhythm at a rate of 78 with a right bundle-branch  block.   ASSESSMENT AND PLAN:  1. Junctional bradycardia.  This is resolved.  It seems clearly      related to his hyperkalemia.  At this point, he does not need a  pacemaker.  His potassium will be followed by Dr. Everardo All.  2. Diastolic heart failure.  He does have marked volume overload.  We      discussed the issue about Lasix.  I had asked him to try and take      20 mg a day to see if this helps.  This edema is also complicated      by his therapy with fludrocortisone.  3. Hypertension.  Blood pressure is elevated today, but he tells me it      is usually in the 120s.  He will follow up with Dr. Everardo All.   DISPOSITION:  We will see him back in 6 months for routine followup.  We  will check a BMET in 1 week just to make sure his potassium is stable.     Bevelyn Buckles. Bensimhon, MD  Electronically Signed    DRB/MedQ  DD: 01/25/2009  DT: 01/26/2009  Job #: [id number]

## 2011-04-22 NOTE — Discharge Summary (Signed)
NAMEHARROLD, FITCHETT                 ACCOUNT NO.:  [id number]   MEDICAL RECORD NO.:  [id number]          PATIENT TYPE:  INP   LOCATION:  3729                         FACILITY:  [facility]   PHYSICIAN:  Gerrit Friends. Dietrich Pates, MD, FACCDATE OF BIRTH:  June 03, 1941   DATE OF ADMISSION:  05/06/2008  DATE OF DISCHARGE:  05/07/2008                               DISCHARGE SUMMARY   PRIMARY CARE Tharun Cappella:  Gregary Signs A. Everardo All, MD   DISCHARGE DIAGNOSIS:  Chest pain.   SECONDARY DIAGNOSES:  1. Nonobstructive coronary artery disease by cardiac catheterization      in 2003, with negative Myoview this month.  2. Hypertension.  3. Hyperlipidemia.  4. Diabetes mellitus.  5. History of pneumonia in 2003 with subsequently adult respiratory      distress syndrome.  6. Nephrolithiasis.  7. Obstructive sleep apnea and continuous positive airway pressure.  8. Chronic bronchitis.   ALLERGIES:  No known drug allergies.   PROCEDURES:  None.   HISTORY OF PRESENT ILLNESS:  A 70 year old Caucasian male with prior  history of nonobstructive CAD.  He was in his usual state of health  until May 06, 2008, when he developed chest tightness and dyspnea that  lasted about an hour and resolved spontaneously in the [facility]  Emergency Department.  He was admitted for further evaluation.   HOSPITAL COURSE:  The patient's troponin was slightly elevated at 0.07,  however, on repeat was 0.05 with normal CKs and MBs.  His creatinine was  also elevated at 1.75.  ECG showed a junctional rhythm with rates in the  high 40s with lateral T-wave inversion.  We reviewed the patient's  recent stress test that was performed in our office and this showed no  ischemia with an EF of 51%.  The patient was fairly adamant that he  preferred to be discharged in the setting of normal stress test and  cardiac markers.  He is being discharged home this afternoon.  We have  advised that he stops lisinopril in the setting of his renal  insufficiency and have followup BMET with Dr. Everardo All in approximately 1  week.   DISCHARGE LABS:  Hemoglobin 13.5, hematocrit 40.0, WBC 12.0, and  platelets 298.  Sodium 135, potassium 5.9, chloride 104, CO2 19, BUN 49,  creatinine 1.75, and glucose 198.  CK 96, MB 3.1, and troponin I 0.05.  Total cholesterol 197, triglycerides 573, HDL 19, and LDL not  calculated.   DISPOSITION:  The patient has been discharged home today in good  condition.   FOLLOWUP PLANS AND APPOINTMENT:  Advised on followup BMET with Dr.  Everardo All in approximately 1 week.   DISCHARGE MEDICATIONS:  1. Aspirin 325 mg daily.  2. Fish oil 1000 mg daily.  3. Niacin 250 mg daily.  4. Gemfibrozil 600 mg daily.  5. Simvastatin 80 mg nightly.  6. Prilosec 20 mg daily.  7. Levothroid 50 mcg daily.  8. Humalog and Lantus as previously prescribed.  9. Multivitamin 1 daily.  10.Nitroglycerin 0.4 mg subcu p.r.n. chest pain.   OUTSTANDING LABORATORY STUDIES:  None.  The patient advised  to have a  BMET in 1 week.   DURATION OF DISCHARGE/ENCOUNTER:  35 minutes including physician time.      Nicolasa Ducking, ANP      Gerrit Friends. Dietrich Pates, MD, [facility]  Electronically Signed    CB/MEDQ  D:  05/07/2008  T:  05/08/2008  Job:  [id number]   cc:   Gregary Signs A. Everardo All, MD

## 2011-04-22 NOTE — Assessment & Plan Note (Signed)
OFFICE VISIT   NAITHAN, DELAGE  DOB:  November 24, 1941                                       07/10/2008  ZOXWR#:[id number]   This is a 70 year old gentleman who underwent thrombectomy and  fasciotomy for a cold leg by Dr. Madilyn Fireman.  He came in today earlier today  to have sutures removed from his fasciotomies.  He represented as his  incisions had opened.   On exam the muscle is viable.  There is no evidence of infection.  We  are going to have the patient pack these wounds.  His wife is being  instructed on how to do this in the office today.  He is going to see  Dr. Madilyn Fireman at his regularly scheduled appointment which is on the 20th.   Jorge Ny, MD  Electronically Signed   VWB/MEDQ  D:  07/10/2008  T:  07/12/2008  Job:  [id number]   cc:   Balinda Quails, M.D.

## 2011-04-22 NOTE — Consult Note (Signed)
[name], AGER                 ACCOUNT NO.:  [id number]   MEDICAL RECORD NO.:  [id number]          PATIENT TYPE:  INP   LOCATION:  3316                         FACILITY:  [facility]   PHYSICIAN:  Bevelyn Buckles. Bensimhon, MDDATE OF BIRTH:  11-Sep-1941   DATE OF CONSULTATION:  06/27/2008  DATE OF DISCHARGE:                                 CONSULTATION   REASON FOR CONSULTATION:  Bradycardia.   HISTORY OF PRESENT ILLNESS:  [name] is a very pleasant 70 year old  male with a history of hypertension, diabetes, hyperlipidemia and  nonobstructive coronary artery disease by catheterization in July of  2003 who was admitted with acute right lower extremity thrombosis and  ischemic leg.  He is status post right femoral embolectomy and bovine  patch angioplasty with a 4-compartment fasciotomy.  Unfortunately, his  postop course has been complicated by acute renal insufficiency,  creatinine has gone to 2.  This has also been complicated by severe  hyperkalemia with a potassium to 7.0.  He has been seen by renal and  given Kayexalate and started on a bicarbonate drip.  His most recent  potassium has come back at 6.2.  This has been complicated by  significant bradycardia and a junctional rhythm with heart rates in the  40s to 50s.  These have been asymptomatic.  He denies any chest pain,  shortness of breath or lightheadedness.   REVIEW OF SYSTEMS:  He denies any fevers or chills.  He has not had any  angina.  He does have some lower extremity neuropathy.  His preoperative  creatinine was supposedly in the 1.5 to 1.7 range.  He does not have any  claudication at baseline.  Without any problems with bleeding.  The  remainder review of systems is negative except for HPI and problem list.   PROBLEM LIST:  1. Hypertension.  2. Diabetes.  3. Hyperlipidemia.  4. Obesity with obstructive sleep apnea.  5. Chronic obstructive pulmonary disease.  6. Gastroesophageal reflux disease.  7. Nonobstructive  coronary artery disease, catheterization in July      2003 for an abnormal Cardiolite, showed an EF of 65% with a 40%      lesion in the mid LAD.  Also, a 30-40% lesion in RCA.   CURRENT MEDICATIONS:  Kayexalate, Zinacef, insulin, Tricor, Protonix,  Lopid, multivitamin.  His lisinopril has been put on hold.  He is also  on aspirin, Lovaza, niacin, Crestor, vitamin D3, and vitamin C.   He has no known drug allergies.   SOCIAL HISTORY:  He is married, lives in [place] with his wife.  He  is retired from the [employer].  Has a history of tobacco times 40  years but quit 1 year ago.   FAMILY HISTORY:  Noncontributory.   PHYSICAL EXAM:  He is in no acute distress.  He is lying flat in bed.  VITAL SIGNS:  Blood pressure is in the 110/50 range.  Heart rates around  45-50.  He is afebrile.  HEENT:  Normal.  NECK:  Supple.  No JVD, carotids are 2+ bilateral without bruits.  There  is no lymphadenopathy or thyromegaly.  CARDIAC:  He has a regular rate and rhythm with an S4, no murmurs.  LUNGS:  Clear.  ABDOMEN:  Obese.  Mildly distended, nontender.  Hypoactive bowel sounds.  EXTREMITIES:  Cold distally with no palpable pulses.  His right leg has  dressings from his recent surgery.  There is no edema.   LABS:  Show a potassium 6.2, sodium is 135, a BUN is 43, creatinine is  2.03.  White count is 12.8, hemoglobin 12.2, platelets 324,000.  Telemetry shows what appears to be a junctional rhythm in the 40s to 50  range.  I think there may also be a sinus bradycardia, which is slower  than the junctional rhythm.  His QRS is just mildly widened at 118  milliseconds.   ASSESSMENT AND PLAN:  [name] has asymptomatic bradycardia in the  setting of acute renal insufficiency and marked hyperkalemia.  He has  been seen by the renal service.  He has been treated with Kayexalate and  sodium bicarb.  This is primarily a renal issue.  If bradycardia is  worsening in the face of persistent  hyperkalemia, he will need  hemodialysis.  Dr. Roanna Banning team is been following him closely.  I will  go ahead and give him an amp of calcium for its mild myocardial  protective properties.  We will sign off.  Please call us if we can help  in any way.      Bevelyn Buckles. Bensimhon, MD  Electronically Signed     DRB/MEDQ  D:  06/27/2008  T:  06/27/2008  Job:  [id number]

## 2011-04-22 NOTE — Consult Note (Signed)
[name], RAINEY                 ACCOUNT NO.:  [id number]   MEDICAL RECORD NO.:  [id number]          PATIENT TYPE:  INP   LOCATION:  3732                         FACILITY:  [facility]   PHYSICIAN:  Luis Abed, MD, FACCDATE OF BIRTH:  February 25, 1941   DATE OF CONSULTATION:  DATE OF DISCHARGE:  12/29/2008                                 CONSULTATION   PRIMARY CARDIOLOGIST:  Bevelyn Buckles. Bensimhon, MD   PRIMARY CARE PHYSICIAN:  Dr. Jill Side.   REASON FOR CONSULTATION:  Persistent bradycardia.   HISTORY OF PRESENT ILLNESS:  A 70 year old obese Caucasian male with  known history of severe diabetes, hypertension, renal insufficiency, was  admitted with generalized fatigue and found to be hyperkalemic with  potassium of 6.9, and found to be in a junctional rhythm.  The patient  also was found to have an elevated TSH of 8.594.  We are asked to see  for persistent bradycardia.  The symptoms had been occurring since  Wednesday, the day of admission with associated weakness and shortness  of breath and no energy.  The patient had trouble with hyperkalemia in  the past after having surgery to remove thrombus of her right femoral  artery with subsequent thrombectomy and was also found to have renal  failure at that time and hyperkalemia with some bradycardia.  The  patient has been on Coumadin and he is managed by [facility].  The patient states that he had a history of hypothyroidism, but  he is no meds here in the hospital and is not listed in his H and P on  admission, but he has been on Levothyroxine upon review of his  medication list.  The patient is initially had junctional rhythm of 40  beats per minute, but now has been in normal sinus rhythm between 60s  and 80s.  The junctional rhythm was asymptomatic.   REVIEW OF SYSTEMS:  Positive for generalized weakness, feeling washed  out and dyspnea on exertion and orthopnea.  The patient chronically  sleeps in a recliner.  He also  uses CPAP for sleep apnea symptoms.  He  denies any nausea, vomiting, diaphoresis, or dizziness.   PAST MEDICAL HISTORY:  1. Severe diabetes.  2. Hypertension.  3. Obstructive sleep apnea using CPAP at night, although this is hard      for him to tolerate.  4. Renal insufficiency.  5. Hyperlipidemia.  6. Nonobstructive CAD.  7. Chronic bronchitis.  8. Chronic shortness of breath.  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  10.Hypothyroidism.  11.Past cardiac catheterization in July 2003, reveals an EF of 65%,      40% LAD, 40% right coronary artery.   PAST SURGICAL HISTORY:  Most recent July 2009, removal of emboli of the  right femoral artery.   SOCIAL HISTORY:  He lives in the [place] with his wife.  He is  retired.  He is married.  He has a 50-pack-year smoker, but quit 2 years  ago.  Alcohol, 2-3 beers a week.  Negative for drug use.   FAMILY HISTORY:  Mother deceased from liver cancer at age 69.  Father  deceased with age 82 from an MI.  He has no brother or sisters.   CURRENT MEDICATIONS:  1. Insulin sliding scale.  2. Zocor 80 mg daily.  3. Aspirin 325 daily.  4. Timoptic eye drops.  5. Lumigan eye drops.  6. Lasix 40 mg IV b.i.d.  7. Protonix 40 mg daily.  8. Warfarin 6 mg with directions per [facility] Clinic.  9. Mucinex 600 mg b.i.d.  10.Levothyroxine 50 mcg daily, although this is not a part of his      current medications, during the hospitalization.  This is      outpatient.  11.Gemfibrozil.   ALLERGIES:  No known drug allergies.   CURRENT LABS:  TSH 8.594.  Sodium 136, potassium 3.9, chloride 98, CO2  of 24, BUN 39, creatinine 1.75, and glucose 81.  Hemoglobin 14.1,  hematocrit 43.4, white blood cells 8.7, and platelets 268, troponins  negative x3 at 0.03, 0.01, and 0.02.  PT 21.8, INR 1.8.   Chest x-ray minimal right basilar atelectasis with questionable 2 mm  diameter left lung nodule versus artifact.  PA and lateral is  suggested.  EKG at most recent shows sinus rate, junctional rhythm, heart rate in  the 50s.  Telemetry revealing normal sinus rhythm with heart rate  between 60s and 80s.   PHYSICAL EXAMINATION:  VITAL SIGNS:  Blood pressure 118/67, pulse 78,  respirations 18, temperature 98.0, O2 sat 98% on room air.  HEENT:  Head is normocephalic and atraumatic.  Eyes, PERRLA.  Mucous  membranes, mouth pink and moist.  Tongue is midline.  NECK:  Supple.  No JVD.  No carotid bruits appreciated.  CARDIOVASCULAR:  Regular rate and rhythm with 1/6 systolic murmur.  LUNGS:  Clear to auscultation without wheezes, rales, or rhonchi.  ABDOMEN:  Obese, nontender, 2+ bowel sounds.  EXTREMITIES:  Without clubbing.  He is positive for cyanosis, and edema  noted.  NEURO:  Cranial nerves II through XII are grossly intact.   IMPRESSION:  1. Sinus bradycardia in the setting of hyperkalemia, now improved.  2. History of hypothyroidism, not listed in history and physical      instantly levothyroxine not ordered in the hospital, should resume.  3. Nonobstructive coronary artery disease per cardiac catheterization      in July 2003, revealing a 40% LAD and a 40% right coronary artery.  4. History of severe diabetes.  5. 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   PLAN:  This is a 70 year old obese, Caucasian male who is admitted with  hyperkalemia and bradycardia and we are asked to see concerning this  since it has been persistent, once the potassium normalized.  The  patient does have a history of bradycardia especially when potassium is  elevated.  He is in normal sinus rhythm.  He has had some asymptomatic  junctional bradycardia in the past.  At this time, he remained in normal  sinus rhythm.  We okay to discharge home.  Resume levothyroxine and  follow with primary care physician.  We will follow with Dr. Gala Romney  as an outpatient for continued cardiac management and considering   discontinuing of eye drops if needed.      [name], NP      Luis Abed, MD, [facility]  Electronically Signed    KML/MEDQ  D:  12/29/2008  T:  12/30/2008  Job:  [id number]   cc:   Dr.  Jill Side

## 2011-04-22 NOTE — Assessment & Plan Note (Signed)
OFFICE VISIT   EIN, RIJO  DOB:  [birth year]                                       08/03/2008  ZOXWR#:[id number]   The patient presented to the emergency department at [facility]  with an acutely ischemic right leg on 06/26/2008.  He underwent  emergency surgery with right femoral thrombectomy and four compartment  fasciotomy right leg.  He is now on long-term Coumadin.  Lower extremity  ABIs were greater than 1.0 bilaterally.   His only problem at this time is that the fasciotomy incisions have  reopened in the medial and lateral aspect of his right calf.  These have  been dressed daily by his wife.  The open wounds appear clean with  granulation tissue.  These would be well treated with a VAC device.  He  has a 1+ right dorsalis pedis pulse.   We plan to follow up in approximately 6 weeks.  Arrange VAC placement  per home health nurse on right lower extremity fasciotomy incisions.   Balinda Quails, M.D.  Electronically Signed   PGH/MEDQ  D:  08/03/2008  T:  08/04/2008  Job:  [id number]

## 2011-04-24 ENCOUNTER — Encounter: Payer: Self-pay | Admitting: Internal Medicine

## 2011-04-25 ENCOUNTER — Ambulatory Visit (INDEPENDENT_AMBULATORY_CARE_PROVIDER_SITE_OTHER): Payer: Medicare Other | Admitting: *Deleted

## 2011-04-25 DIAGNOSIS — I743 Embolism and thrombosis of arteries of the lower extremities: Secondary | ICD-10-CM

## 2011-04-25 NOTE — Discharge Summary (Signed)
[place]  Patient:    JAHZIAH, SIMONIN Visit Number: [id number] MRN: [id number]          Service Type: [facility] Location: [id number] Attending Physician:  Faith Rogue T Dictated by:   Dian Situ, PA Admit Date:  03/03/2002 Discharge Date: 03/11/2002   CC:         Elana Alm. Eliezer Lofts., M.D.  Danice Goltz, M.D.   Discharge Summary  DISCHARGE DIAGNOSES: 1. Deconditioning with left peroneal nerve palsy. 2. History of tobacco abuse. 3. Sacral decubitus. 4. Diabetes mellitus. 5. Legionella urinary tract infection, treated.  HISTORY OF PRESENT ILLNESS:  Mr. Cruzita Lederer is a 70 year old male with history of diabetes mellitus and tobacco abuse admitted to [facility] March 06 with pneumonia, sepsis and acute renal failure.  He required prolonged vent support secondary to ARDS.  He was extubated on March 21 and was noted to have positive sinusitis.  He has been treated with multiple antibiotics as well as steroids.  His urine culture was positive for Legionella.  The patient continues on Zithromax with recommendation for 21 total days of therapy.  He has been treated with Zosyn for his sinusitis. Post extubation the patient was noted to be deconditioned with question of left peroneal nerve palsy.  Other issues have been problems with elevated blood sugars secondary to steroids; started on Lantus insulin for control.  Physical therapy was initiated and the patient _____ moderate assist for transfers to total assist, 60-70% to ambulate 14 feet times two.  PAST MEDICAL HISTORY:  Significant for DM Type II with borderline control and hypercholesterolemia.  ALLERGIES:  No known drug allergies.  SOCIAL HISTORY:  The patient is married.  Lives with wife in one-level home with five steps at entry; was independent and working prior to admission. Uses alcohol occasionally.  Smokes one pack/day.  HOSPITAL COURSE:  Mr. Nike Southwell was admitted rehab on March 03, 2002 for inpatient therapies to consist of PT and OT daily.  Post admission DVT prophylaxis was initiated with subcu Lovenox.  His diabetes was monitored with a.c. and h.s. CBG checks as well as ADA diet.  The patient was continued on Zithromax for 21 total days of antibiotic therapy.  The patients blood sugars were noted to be borderline controlled and Actos and Glucophage were added as at home.  By the time of discharge blood sugars showed varying control ranging from 150 to an occasional high in the low 200s; expect efficacy of Actos to maximize it within the next couple of weeks.  The patients blood pressure was reasonably controlled.  He has been afebrile during his stay.  Labs check post admission showed hemoglobin 10.6, hematocrit 30.6, white count 6.7 and platelets 322,000.  Sodium 138, potassium 3.6, chloride 108, CO2 25, BUN 11, creatinine 0.6, and glucose 138.  Albumin 2.0, AST 21, ALT 53, alk phos 96, and T bili 0.4.  He was noted to have a sacral decub that is currently being treated with _________________ pressure relief.  The patient is to continue with _________ after discharge with recommendations for change q3. days.  Mr. Cruzita Lederer made good progress during his stay.  By the time of discharge he was independent with bed mobility, supervision for sit to stand and stands with transfers.  He was close supervision for ambulating 150+ feet with rolling walker on level surfaces.  A left AFO was ordered for his footdrop secondary to his peroneal nerve palsy.  The patient was  at min to steady assist when ambulating five steps with railing.  He was able to navigate wheelchair independently 450 feet.  The patient is to continue with follow up PT to be provided by [facility].  A home health R.N. was arranged to monitor progress of decub post discharge.  On March 20, 2002 the patient is discharged to home.  DISCHARGE HOME  MEDICATIONS: 1. Nasonex 1 squirt each nostril b.i.d. p.r.n. 2. Protonix 40 mg q.d. 3. Ferrous sulfate 325 mg b.i.d. 4. Lantus 85 units q.h.s. 5. Actos 50 mg q.d. 6. Glucophage 1,000 mg b.i.d. 7. Wellbutrin SR 150 mg b.i.d. 8. Combivent 2 puffs b.i.d.  ACTIVITY:  Use a wheelchair.  Ambulate only with supervision.  Assistance as needed.  DIET:  Diabetic diet.  SPECIAL INSTRUCTIONS:  No alcohol.  No smoking.  No driving.  Check blood sugars b.i.d. and record.  FOLLOW-UP:  The patient is to follow up with Dr. Delford Field as needed.  Follow up with Dr. Nicholos Johns for appointment in two to three weeks.  Follow up with Dr. Riley Kill for EMG and nerve conduction studies Apr 19, 2002 at 11:30 A.M. Dictated by:   Dian Situ, PA Attending Physician:  Faith Rogue T DD:  04/24/02 TD:  04/26/02 Job: [id number]

## 2011-04-25 NOTE — Discharge Summary (Signed)
[facility]  Patient:    [name], [name] Visit Number: [id number] MRN: [id number]          Service Type: [facility] Location: [phone] Attending Physician:  Faith Rogue T Dictated by:   Earley Favor, RN, MSN, ACNP Admit Date:  03/03/2002 Disc. Date: 03/03/02   CC:         Jetty Duhamel, M.D.  Robert A. Eliezer Lofts., M.D.   Discharge Summary  DATE OF BIRTH:  [birth year]  DISCHARGE DIAGNOSES: 1. Community-acquired pneumonia with acute respiratory failure and acute    respiratory distress syndrome requiring mechanical ventilatory support. 2. Legionella positive urine antigen. 3. Sinusitis. 4. Critical illness polyneuropathy and debilitation. 5. Diabetes mellitus.  HISTORY OF PRESENT ILLNESS:  [name] is a 70 year old white male, 1-2 pack-per-day smoker x 43 years, who developed flu-like symptoms on February 05, 2002, with cough, fever of 102.2, multiple hard chills and sweats. He was quoted as having said he felt like he had the flu and was seen by his family doctor, Dr. Nicholos Johns, and treated his symptoms with ibuprofen, fluids, and rest.  He developed increasing shortness of breath, a nonproductive cough, weakness, and general malaise by March 3 to February 08, 2002.  He continued to have fevers.  He presented to the [facility] Emergency Department on February 10, 2002.  He was found to be hypotensive, hypoxic, and in the early stages of shock.  Fluid resuscitation with normal saline was instituted to get his blood pressure to 94/64 and decrease his heart rate in the 70s. The stats were noted to be as low as 80 until he was placed on 100% nonrebreather mask.  He was started on IV antibiotics.  Due to the severity and the acuity of his illness, the pulmonary critical care team was asked to assume his care at this time.  LABORATORY DATA:  Arterial blood gases were pH 7.4, peak CO2 of 42, pO2 of 79.8, bicarb 29.8, 40% FIO2.  Hemoglobin 8.9,  hematocrit 26.5, WBC 8.6, platelets 227.  Sodium 139, potassium 4.0, chloride 110, CO2 24, BUN 11, creatinine 0.8, glucose 106.  Calcium 8.1.  RADIOGRAPHIC DATA:  His last chest x-ray on March 02, 2002, demonstrates improved aeration; patchy interspace disease remains at the lung bases. Limited CT of the sinus has demonstrated sinusitis.  HOSPITAL COURSE: #1 - COMMUNITY-ACQUIRED PNEUMONIA:  [name] was admitted to [facility], required intubation on February 10, 2002, with successful liberation from the vent on February 25, 2002.  He developed acute respiratory distress syndrome and required below stretcher pressure with tidal volumes in the low 400s.  His oxygenation was so poor that he required pronation to properly ventilate him during the early phases of his acute respiratory distress.  Again, he did well and was extubated on February 25, 2002.  A chest x-ray does show some residual patchy air space disease.  This will be followed in the rehab situation.  #2 - SEVERE SEPSIS:  Severe sepsis was treated with the ______ trial.  He was also treated with fluid resuscitation, did not require vasoactive drips.  #3 - LEGIONELLA POSITIVE URINE:  He did have on urine positive Legionella antigen, and currently he is on day 14 of 21 days of Zithromax.  #4 - SINUSITIS:  Again, he had sinusitis.  He was also noted to have a feeding tube in during the evaluation.  He was started on Zosyn until feeding tube was discontinued, and then the  Zosyn was discontinued at that time.  He will be continued on nasal hygiene for four more days, and then that would be stopped.  #5 - CRITICAL ILLNESS POLYNEUROPATHY AND DEBILITATION:  After sedation was stopped, of note, he required neuromuscular blockade with ______ and did receive IV steroids due to his muscular spastic state.  He was extremely weak, with just sort of weakness in all extremities.  He rallied quite well towards the end of his stay in  the ICU and was ready for discharge to rehab by March 03, 2002.  This critical illness polyneuropathy will be followed on an outpatient basis.  His debility will be addressed at the rehab center where he will undergo strengthening in rehab.  #6 - DIABETES MELLITUS:  He was noted to be a brittle diabetic prior to his admission, using NPH insulin at the level of 100 units a day.  He has been started on Lantus while in the hospital.  His CBGs prior to discharge have come from 307 to 84, and his Lantus had been decreased to 75 mg q.d. after his tube feeding had been stopped.  This will need to be followed on an inpatient basis in rehab to maintain appropriate hyperglycemic control.  DISCHARGE MEDICATIONS:  1. Lovenox 40 mg q.d.  2. Nasal spray in each nostril q.4h.  3. Nasonex 2 puffs b.i.d.  4. Lantus 75 units q.d. at 2:00.  5. Protonix 40 mg q.d.  6. Sliding scale insulin, for CBG of 201-250, 8 units; 251-300, 12 units;     301-350, 16 units; 351-400, 20 units; 400 give 24 units and continue as IV     and check his CBG in one hour.  7. Zithromax 500 mg q.d. x 7 more days.  8. Tylenol 650 mg q.6h. p.r.n.  9. Laxative of choice. 10. Albuterol 2.5 hand-held nebulizer q.4h. p.r.n.  DIET:  Mechanical and soft at this point and advance as tolerated.  He should be on an American Diabetic Association diet.  SPECIAL INSTRUCTIONS:  Per the rehab service.  DISPOSITION/CONDITION ON DISCHARGE:  His acute respiratory distress syndrome has resolved.  He continues to have bilateral patchy air space on chest x-ray. His chronic critical illness and polyneuropathy has improved.  He is going to go into rehabilitation at the rehab center at [facility].  His diabetes mellitus has been rather variable.  Currently he is on Lantus 75 mg q.d. and will require sliding scale insulin and CBGs on a.c. and h.s. schedule.  All in all, considering the severity of illness, he was done quite  well, is  being transferred to rehabilitation today for continued treatment. Dictated by:   Earley Favor, RN, MSN, ACNP Attending Physician:  Faith Rogue T DD:  03/03/02 TD:  03/03/02 Job: [id number]

## 2011-04-25 NOTE — Cardiovascular Report (Signed)
[place]  Patient:    [name], [name] Visit Number: [id number] MRN: [id number]          Service Type: CAT Location: [facility] 2860 01 Attending Physician:  Ronaldo Miyamoto Dictated by:   Arturo Morton Riley Kill, M.D. [facility] Proc. Date: 06/15/02 Admit Date:  06/15/2002 Discharge Date: 06/15/2002   CC:         Justine Null, M.D. [facility]  CV Laboratory   Cardiac Catheterization  INDICATIONS:  The patient is a 70 year old who has had some shortness of breath following a pneumonia.  He now presents with exertional symptoms as well as somewhat abnormal Cardiolite.  The current study is done to assess coronary anatomy.  PROCEDURES: 1. Left heart catheterization. 2. Selective coronary angiography. 3. Selective left ventriculography.  CARDIOLOGIST:  Arturo Morton. Riley Kill, M.D. [facility]  DESCRIPTION OF PROCEDURE:  The procedure was performed from the right femoral artery using 6 French catheters.  He tolerated the procedure well, and there were no complications.  HEMODYNAMICS:  The central aortic pressure was 106/70.  LV pressure 114/7. There was no gradient on pullback across the aortic valve.  ANGIOGRAPHIC DATA: 1. On plain fluoroscopy there was evidence of calcification of the coronary    arteries.  This was at least moderate. 2. The left ventriculogram performed in the RAO projection reveals preserved    global systolic function.  No segmental abnormalities or contraction    were identified.  Ejection fraction was calculated at 64.5%. 3. The left main coronary artery is long.  There appears to be calcium    near the ostium.  There does not appear to be high grade stenosis.  It    is relatively smooth. 4. The left anterior descending artery courses to the apex.  In the LAO    projection, there is evidence of about 40% narrowing over a long segment    of the left anterior descending.  This does not appear to be critical.    The LAD then bifurcates  into a diagonal and LAD proper with about 40%    narrowing in the LAD proper.  There is fairly heavy calcification at the    bifurcation. 5. There is a circumflex which provides an intermittent distribution marginal    branch that is free of critical disease.  The AV circumflex has mild    luminal irregularity but is free of significant disease. 6. The right coronary artery is a calcified vessel near the ostium.  There    is about 30% narrowing eccentrically proximally with slight haziness which    is probably due to eccentric plaquing and calcification.  There is 30-40%    narrowing more distally.  The PDA and posterior lateral branch are    without critical disease.  CONCLUSIONS: 1. Preserved left ventricular function. 2. Scattered coronary abnormalities as described above.  RECOMMENDATIONS: 1. Continue lipid lowering therapy and antiplatelet therapy. 2. Consider exercise rehabilitation. 3. Check D-diamer. Dictated by:   Arturo Morton Riley Kill, M.D. [facility] Attending Physician:  Ronaldo Miyamoto DD:  06/15/02 TD:  06/18/02 Job: [id number]

## 2011-04-28 ENCOUNTER — Other Ambulatory Visit: Payer: Self-pay | Admitting: Internal Medicine

## 2011-04-29 ENCOUNTER — Encounter: Payer: Self-pay | Admitting: Internal Medicine

## 2011-04-29 ENCOUNTER — Ambulatory Visit (INDEPENDENT_AMBULATORY_CARE_PROVIDER_SITE_OTHER): Payer: Medicare Other | Admitting: Internal Medicine

## 2011-04-29 VITALS — BP 130/60 | HR 60 | Resp 20 | Ht 71.0 in | Wt 306.1 lb

## 2011-04-29 DIAGNOSIS — I509 Heart failure, unspecified: Secondary | ICD-10-CM

## 2011-04-29 DIAGNOSIS — Z01810 Encounter for preprocedural cardiovascular examination: Secondary | ICD-10-CM

## 2011-04-29 DIAGNOSIS — I5022 Chronic systolic (congestive) heart failure: Secondary | ICD-10-CM

## 2011-04-29 DIAGNOSIS — Z01818 Encounter for other preprocedural examination: Secondary | ICD-10-CM

## 2011-04-29 DIAGNOSIS — I5032 Chronic diastolic (congestive) heart failure: Secondary | ICD-10-CM

## 2011-04-29 NOTE — Progress Notes (Signed)
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 He is now on long-term Coumadin.  Lower extremity ABIs were greater than 1.0 bilaterally. Had recurrent junctional rhythm and had permanent pacer placed.Marland Kitchen  Has been having frequent gallbladder attacks as well as chronic pancreatitis. Saw Dr. Jarold Motto who has recommended cholecystectomy with Dr. Bertram Savin.   Continues with chronic dyspnea without much change. No CP. Chronic mild edema. Only taking lasix every other day as it makes him go to the bathroom too much. Now sleeping in recliner due to orthopnea. Still having trouble with his CPAP and not using it.  Has not followed up with Dr. Shelle Iron.   Had heart cath July 2003. EF 65%. LAD 40% LCx ok RCA 30-40%. Had echo 12/10. EF 60-65% RV mildly dilated. But normal function. PA pressure significantly elevated in 60-70 range.   ROS: All systems negative except as listed in HPI, PMH and Problem List.  Past Medical History  Diagnosis Date  . Diabetes mellitus   . Hypertension   . COPD (chronic obstructive pulmonary disease)     CPAP  . Renal insufficiency   . Hyperlipemia   . Anemia   . Other and unspecified coagulation defects   . Hypothyroidism   . CAD (coronary artery disease)   . Hyperkalemia   . Osteoarthritis   . Glaucoma   . Sick sinus syndrome   . Pancreatitis   . Obesity   . Constipation   . OSA on CPAP     using CPAP although it is hard for him to tolerate  . SOB (shortness of breath)     Current Outpatient Prescriptions  Medication Sig Dispense Refill  . Ascorbic Acid (VITAMIN C) 500 MG tablet Take 500 mg by mouth daily.        Marland Kitchen aspirin 81 MG tablet Take 81 mg by mouth daily.         . Bimatoprost (LUMIGAN) 0.01 % SOLN Apply to eye. Once daily-one drop both eyes       . brimonidine (ALPHAGAN P) 0.1 % SOLN One drop both eyes twice daily       . Cholecalciferol (VITAMIN D3) 400 UNITS CAPS Take 1,000 Units by mouth daily.       Marland Kitchen dextromethorphan-guaiFENesin (MUCINEX DM) 30-600 MG per 12 hr tablet Take 1 tablet by mouth as directed.        . Docusate Calcium ([employer] STOOL SOFTENER PO) Take by mouth. Two daily       . dorzolamide (TRUSOPT) 2 % ophthalmic solution 1 drop. One drop both eyes twice daily       . FeFum-FePo-FA-B Cmp-C-Zn-Mn-Cu (SE-TAN PLUS PO) Take by mouth.        . fenofibrate micronized (LOFIBRA) 200 MG capsule Take 200 mg by mouth.        . fludrocortisone (FLORINEF) 0.1 MG tablet 1 tablet by mouth once daily  30 tablet  3  . furosemide (LASIX) 20 MG tablet Take 20 mg by mouth daily.        Marland Kitchen glucose blood test strip 1 each by Other route 4 (four) times daily. Use as instructed       . Insulin Regular Human (HUMULIN R U-500, CONCENTRATED, [place]) Inject into the skin.  Injections are four times a day....... 200 units in the morning, 175 units at lunch, 200 units at dinner, and 175 units at bedtime       . Insulin Syringe-Needle U-100 (B-D INS SYR ULTRAFINE 1CC/30G) 30G X 1/2" 1 ML MISC by Does not apply route. 4x a day for insulin       . levothyroxine (SYNTHROID, LEVOTHROID) 50 MCG tablet Take 50 mcg by mouth daily.        . OMEGA-3 1000 MG CAPS Take by mouth.        Marland Kitchen omeprazole (PRILOSEC) 20 MG capsule        . oxyCODONE-acetaminophen (PERCOCET) 5-325 MG per tablet Take 1 tablet by mouth every 4 (four) hours as needed.        . Pancrelipase, Lip-Prot-Amyl, (CREON) 24000 UNITS CPEP Take by mouth. Two capsules by mouth three times a day       . Probiotic Product (PROBIOTIC ACIDOPHILUS) CAPS Take by mouth.        . rosuvastatin (CRESTOR) 20 MG tablet Take 20 mg by mouth. Only take Monday, Wed and Friday       . ursodiol (ACTIGALL) 300 MG capsule Take 300 mg by  mouth 2 (two) times daily.        Marland Kitchen warfarin (COUMADIN) 4 MG tablet Take 4 mg by mouth daily. As directed       . DISCONTD: omeprazole (PRILOSEC) 20 MG capsule TAKE AS DIRECTED  30 capsule  2   Current Facility-Administered Medications  Medication Dose Route Frequency Provider Last Rate Last Dose  . cyanocobalamin ((VITAMIN B-12)) injection 1,000 mcg  1,000 mcg Intramuscular Q30 days Sheryn Bison, MD   1,000 mcg at 03/13/11 1117     PHYSICAL EXAM: Filed Vitals:   04/29/11 1025  BP: 130/60  Pulse: 60  Resp: 20   General:  He ambulates around the clinic slowly. Mild dyspnea. Sats 95-97% on RA HEENT:  Normal NECK:  Supple and thick.  I am unable to evaluate his JVD.  Carotids are 2+ bilaterally without bruits.  No appreciable lymphadenopathy. CARDIAC:  PMI is nonpalpable.  He is regular with distant heart sounds. No obvious murmurs. LUNGS:  Clear with decreased breath sounds throughout.  No wheezes or rales. ABDOMEN:  Markedly obese, nontender, nondistended.  No bruits.  I am unable to examine for any hepatosplenomegaly. EXTREMITIES:  Edema tr- 1+ bilaterally. Chronic venous stasis changes   NEUROLOGIC:  Alert and oriented x3.  Cranial nerves II through XII are intact.  Moves all 4 extremities without difficulty.  Affect is pleasant.    ECG: NSR 60 RBBB nonspecific ST-Ts   ASSESSMENT & PLAN:

## 2011-04-29 NOTE — Assessment & Plan Note (Signed)
Doing OK. Probably mild volume overload. Encouraged him to take lasix on daily basis whenever possible.

## 2011-04-29 NOTE — Patient Instructions (Signed)
Your physician has requested that you have an echocardiogram. Echocardiography is a painless test that uses sound waves to create images of your heart. It provides your doctor with information about the size and shape of your heart and how well your heart's chambers and valves are working. This procedure takes approximately one hour. There are no restrictions for this procedure. Your physician has requested that you have a lexiscan myoview. For further information please visit [email or web address] Please follow instruction sheet, as given. You have been referred to follow up with Dr Shelle Iron for pre-op lung clearance Your physician wants you to follow-up in: 6 months.  You will receive a reminder letter in the mail two months in advance. If you don't receive a letter, please call our office to schedule the follow-up appointment.

## 2011-04-29 NOTE — Assessment & Plan Note (Signed)
Given all the trouble he is having with his gallbladder, I agree with Dr. Jarold Motto that it is probably reasonable to consider cholecystectomy however I do worry about his operative risk from both a cardiac and perhaps more from a pulmonary standpoint. i have reviewed both his echo from 2010 and previous cath from 2003. Cath with moderate CAD but ECHO suggests moderate to severe PAH which is likely related to untreated severe OSA, diastolic HF and probable obesity hypoventialtion syndrome. Will proceed with repeat echo, Myoview and also refer to Dr. Shelle Iron to get his opinion from a pulmonary standpoint including possible ABG.

## 2011-05-01 ENCOUNTER — Encounter: Payer: Self-pay | Admitting: Pulmonary Disease

## 2011-05-06 ENCOUNTER — Other Ambulatory Visit (INDEPENDENT_AMBULATORY_CARE_PROVIDER_SITE_OTHER): Payer: Medicare Other

## 2011-05-06 ENCOUNTER — Encounter: Payer: Self-pay | Admitting: Endocrinology

## 2011-05-06 ENCOUNTER — Ambulatory Visit (INDEPENDENT_AMBULATORY_CARE_PROVIDER_SITE_OTHER): Payer: Medicare Other | Admitting: Endocrinology

## 2011-05-06 ENCOUNTER — Other Ambulatory Visit (INDEPENDENT_AMBULATORY_CARE_PROVIDER_SITE_OTHER): Payer: Medicare Other | Admitting: Endocrinology

## 2011-05-06 DIAGNOSIS — N259 Disorder resulting from impaired renal tubular function, unspecified: Secondary | ICD-10-CM

## 2011-05-06 DIAGNOSIS — Z79899 Other long term (current) drug therapy: Secondary | ICD-10-CM

## 2011-05-06 DIAGNOSIS — Z13220 Encounter for screening for lipoid disorders: Secondary | ICD-10-CM

## 2011-05-06 DIAGNOSIS — Z125 Encounter for screening for malignant neoplasm of prostate: Secondary | ICD-10-CM

## 2011-05-06 DIAGNOSIS — I10 Essential (primary) hypertension: Secondary | ICD-10-CM

## 2011-05-06 DIAGNOSIS — E7800 Pure hypercholesterolemia, unspecified: Secondary | ICD-10-CM

## 2011-05-06 DIAGNOSIS — H9190 Unspecified hearing loss, unspecified ear: Secondary | ICD-10-CM

## 2011-05-06 DIAGNOSIS — E109 Type 1 diabetes mellitus without complications: Secondary | ICD-10-CM

## 2011-05-06 LAB — BASIC METABOLIC PANEL
CO2: 28 mEq/L (ref 19–32)
Calcium: 9.4 mg/dL (ref 8.4–10.5)
Creatinine, Ser: 1.5 mg/dL (ref 0.4–1.5)
Glucose, Bld: 151 mg/dL — ABNORMAL HIGH (ref 70–99)

## 2011-05-06 LAB — LIPID PANEL
Cholesterol: 246 mg/dL — ABNORMAL HIGH (ref 0–200)
HDL: 39 mg/dL — ABNORMAL LOW (ref >39.00)
VLDL: 63.2 mg/dL — ABNORMAL HIGH (ref 0.0–40.0)

## 2011-05-06 LAB — MICROALBUMIN / CREATININE URINE RATIO: Microalb Creat Ratio: 40.7 mg/g — ABNORMAL HIGH (ref 0.0–30.0)

## 2011-05-06 LAB — HEMOGLOBIN A1C: Hgb A1c MFr Bld: 9.4 % — ABNORMAL HIGH (ref 4.6–6.5)

## 2011-05-06 NOTE — Progress Notes (Signed)
Subjective:    Patient ID: [name], male    DOB: 01-18-1941, 70 y.o.   MRN: [id number]  HPI here for regular wellness examination.  He's feeling pretty well in general, and says chronic med probs are stable, except as noted below.  Gallbladder surg is planned soon. Past Medical History  Diagnosis Date  . Diabetes mellitus   . Hypertension   . COPD (chronic obstructive pulmonary disease)     CPAP  . Renal insufficiency   . Hyperlipemia   . Anemia   . Other and unspecified coagulation defects   . Hypothyroidism   . CAD (coronary artery disease)   . Hyperkalemia   . Osteoarthritis   . Glaucoma   . Sick sinus syndrome   . Pancreatitis   . Obesity   . Constipation   . OSA on CPAP     using CPAP although it is hard for him to tolerate  . SOB (shortness of breath)     Past Surgical History  Procedure Date  . Nasal septum surgery 1967  . Pacemaker placement 06/2010    [facility] Accent RF DR, Model [phone] ( Serial number [id number])  . Thromboembolectomy and four compartment fasciotomy 2009    History   Social History  . Marital Status: Married    Spouse Name: N/A    Number of Children: N/A  . 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 Smoking status: Former Smoker -- 2.0 packs/day for 50 years    Types: Cigarettes    Quit date: 12/08/2006  . Smokeless tobacco: Never Used  . Alcohol Use: 1.0 - 1.5 oz/week    2-3 drink(s) per week  . Drug Use: No  . Sexually Active: Not on file   Other Topics Concern  . Not on file   Social History Narrative  . No narrative on file    Current Outpatient Prescriptions on File Prior to Visit  Medication Sig Dispense Refill  . Ascorbic Acid (VITAMIN C) 500 MG tablet Take 500 mg by mouth daily.        Marland Kitchen aspirin 81 MG tablet Take 81 mg by mouth daily.        . Bimatoprost (LUMIGAN) 0.01 % SOLN Apply to eye. Once daily-one drop both eyes       .  brimonidine (ALPHAGAN P) 0.1 % SOLN One drop both eyes twice daily       . Cholecalciferol (VITAMIN D3) 400 UNITS CAPS Take 1,000 Units by mouth daily.       Marland Kitchen dextromethorphan-guaiFENesin (MUCINEX DM) 30-600 MG per 12 hr tablet Take 1 tablet by mouth as directed.        . Docusate Calcium (CVS STOOL SOFTENER PO) As needed      . dorzolamide (TRUSOPT) 2 % ophthalmic solution 1 drop. One drop both eyes twice daily       . fenofibrate micronized (LOFIBRA) 200 MG capsule Take 200 mg by mouth.        . fludrocortisone (FLORINEF) 0.1 MG tablet 1 tablet by mouth once daily  30 tablet  3  . furosemide (LASIX) 20 MG tablet Take 20 mg by mouth daily.        Marland Kitchen glucose blood test strip 4 (four) times daily. Use as instructed      . Insulin Regular Human (HUMULIN R U-500, CONCENTRATED, [place]) Inject into the skin. Injections  are four times a day....... 200 units in the morning, 175 units at lunch, 200 units at dinner, and 175 units at bedtime       . Insulin Syringe-Needle U-100 (B-D INS SYR ULTRAFINE 1CC/30G) 30G X 1/2" 1 ML MISC 4x a day for insulin      . levothyroxine (SYNTHROID, LEVOTHROID) 50 MCG tablet Take 50 mcg by mouth daily.        . OMEGA-3 1000 MG CAPS Take 1 capsule by mouth daily.       Marland Kitchen omeprazole (PRILOSEC) 20 MG capsule Take 20 mg by mouth daily.       Marland Kitchen oxyCODONE-acetaminophen (PERCOCET) 5-325 MG per tablet Take 1 tablet by mouth every 4 (four) hours as needed.        . Pancrelipase, Lip-Prot-Amyl, (CREON) 24000 UNITS CPEP Take by mouth. Two capsules by mouth three times a day       . Probiotic Product (PROBIOTIC ACIDOPHILUS) CAPS Take by mouth.        . ursodiol (ACTIGALL) 300 MG capsule Take 300 mg by mouth 2 (two) times daily.        Marland Kitchen warfarin (COUMADIN) 4 MG tablet Take 4 mg by mouth daily. As directed       . FeFum-FePo-FA-B Cmp-C-Zn-Mn-Cu (SE-TAN PLUS PO) Take by mouth.        . rosuvastatin (CRESTOR) 20 MG tablet Take 20 mg by mouth. Only take Monday, Wed and Friday        Current  Facility-Administered Medications on File Prior to Visit  Medication Dose Route Frequency Provider Last Rate Last Dose  . cyanocobalamin ((VITAMIN B-12)) injection 1,000 mcg  1,000 mcg Intramuscular Q30 days Sheryn Bison, MD   1,000 mcg at 03/13/11 1117    Allergies  Allergen Reactions  . Pioglitazone     REACTION: Edema    Family History  Problem Relation Age of Onset  . Liver cancer Mother     deceased age 77  . Cancer Mother     liver cancer  . Colon cancer Neg Hx   . Heart attack Father     BP 122/68  Pulse 69  Temp(Src) 98.4 F (36.9 C) (Oral)  Ht 5\' 11"  (1.803 m)  Wt 305 lb 1.9 oz (138.402 kg)  BMI 42.56 kg/m2  SpO2 97%      Review of Systems  Constitutional: Negative for fever.  HENT:       No change in chronic hearing loss  Eyes: Negative for visual disturbance.  Respiratory:       No change in chronic doe  Cardiovascular: Negative for chest pain.  Gastrointestinal: Negative for anal bleeding.  Genitourinary: Negative for hematuria.  Musculoskeletal: Negative for arthralgias.  Skin: Negative for rash.  Neurological: Negative for syncope.       He attributes headache to allergic rhinitis  Hematological: Does not bruise/bleed easily.  Psychiatric/Behavioral: The patient is not nervous/anxious.        Mild depression       Objective:   Physical Exam VS: see vs page GEN: no distress HEAD: head: no deformity eyes: no periorbital swelling, no proptosis external nose and ears are normal mouth: no lesion seen NECK: supple, thyroid is not enlarged CHEST WALL: no deformity BREASTS:  No gynecomastia CV: reg rate and rhythm, no murmur ABD: abdomen is soft, nontender.  no hepatosplenomegaly.  not distended.  no hernia GENITALIA:  Normal male.   RECTAL: normal external and internal exam.  heme neg. PROSTATE:  Normal  size.  No nodule MUSCULOSKELETAL: muscle bulk and strength are grossly normal.  no obvious joint swelling.  gait is normal and  steady EXTEMITIES: no deformity.  no ulcer on the feet.  feet are of normal color and temp.  no edema PULSES: dorsalis pedis intact bilat.  no carotid bruit NEURO:  cn 2-12 grossly intact.   readily moves all 4's.  sensation is intact to touch on the feet SKIN:  Normal texture and temperature.  No rash or suspicious lesion is visible.   NODES:  None palpable at the neck PSYCH: alert, oriented x3.  Does not appear anxious nor depressed.    Assessment & Plan:  Wellness visit today, with problems stable, except as noted.   SEPARATE EVALUATION FOLLOWS--EACH PROBLEM HERE IS NEW, NOT RESPONDING TO TREATMENT, OR POSES SIGNIFICANT RISK TO THE PATIENT'S HEALTH: HISTORY OF THE PRESENT ILLNESS: he brings a record of his cbg's which i have reviewed today.  It is occasionally low before the evening meal.  There is otherwise no trend throughout the day.   He ran out of crestor, due to cost PAST MEDICAL HISTORY reviewed and up to date today REVIEW OF SYSTEMS: PHYSICAL EXAMINATION: Pulses:  dorsalis pedis intact bilat.  Extremities:  1+ right pedal edema and 1+ left pedal edema.   no deformity.  no ulcer on the feet.  feet are of normal temp.  There is hyperpigmentation of the legs, c/w chronic venous insuff. there are 3 healed surgical scars on the right leg, and severely mycotic toenails.   the skin on the feet is very dry. Neurologic:  sensation is intact to touch on the feet, but decreased from normal LAB/XRAY RESULTS: (i reviewed a1c and lipids) IMPRESSION: Dyslipidemia, therapy limited by pt's request for least expensive meds Dm, needs increased rx PLAN: crestor samples are given

## 2011-05-06 NOTE — Patient Instructions (Addendum)
your surgical risk is outweighed by the potential benefit of the surgery.  You are therefore medically cleared, except he needs cardiol clearance, which is in process.  You will need careful monitoring of your blood sugar in the hospital, due to your high insulin requirement. please consider these measures for your health:  minimize alcohol.  do not use tobacco products.  have a colonoscopy at least every 10 years from age 70.  keep firearms safely stored.  always use seat belts.  have working smoke alarms in your home.  see an eye doctor and dentist regularly.  never drive under the influence of alcohol or drugs (including prescription drugs).  those with fair skin should take precautions against the sun. please let me know what your wishes would be, if artificial life support measures should become necessary.  it is critically important to prevent falling down (keep floor areas well-lit, dry, and free of loose objects) Please make a follow-up appointment in 3 months. blood tests are being ordered for you today.  please call [phone] to hear your test results.  You will be prompted to enter the 9-digit "MRN" number that appears at the top left of this page, followed by #.  Then you will hear the message. pending the test results, please reduce the lunch insulin to 150 units. (update: i left message on phone-tree:  Increase reg insulin to (just before each meal) 220-150-220-200 units. Resume crestor.  Ret 6 weeks.

## 2011-05-08 ENCOUNTER — Other Ambulatory Visit: Payer: Self-pay | Admitting: Gastroenterology

## 2011-05-08 MED ORDER — OXYCODONE-ACETAMINOPHEN 5-325 MG PO TABS: 1.0000 | ORAL_TABLET | ORAL | Status: DC | PRN

## 2011-05-08 NOTE — Telephone Encounter (Signed)
Pt with hx of Insulin Dependent Diabetes, Chronic Alcoholism with Calcific Pancreatitis, Calcified Gallstones, Pancreatic Pseudocyst among many others. Last OV 03/13/11 and it was recommended he have a Cholecystectomy. Pt saw Dr Everardo All 05/07/11 and was medically cleared, but he must have Cardiac and Pulomonary clearance which are scheduled.  Pt would like a refill on his Percocet 5/325mg , 1 q4hr prn. Pt states he still has abdominal pain and feels nauseated 2-3 times weekly. OK to refill? Thanks.

## 2011-05-08 NOTE — Telephone Encounter (Signed)
Refill x1. He needs surgical evaluation ASAP.

## 2011-05-08 NOTE — Telephone Encounter (Signed)
lmom that Dr Jarold Motto needs to sign his script and he can pick it up tomorrow- Dr Jarold Motto is out of the office this afternoon.

## 2011-05-12 ENCOUNTER — Ambulatory Visit (HOSPITAL_BASED_OUTPATIENT_CLINIC_OR_DEPARTMENT_OTHER): Payer: Medicare Other | Admitting: Radiology

## 2011-05-12 ENCOUNTER — Ambulatory Visit (HOSPITAL_COMMUNITY): Payer: Medicare Other | Attending: Internal Medicine | Admitting: Radiology

## 2011-05-12 ENCOUNTER — Other Ambulatory Visit (HOSPITAL_COMMUNITY): Payer: Medicare Other | Admitting: Radiology

## 2011-05-12 DIAGNOSIS — R05 Cough: Secondary | ICD-10-CM | POA: Insufficient documentation

## 2011-05-12 DIAGNOSIS — G4733 Obstructive sleep apnea (adult) (pediatric): Secondary | ICD-10-CM | POA: Insufficient documentation

## 2011-05-12 DIAGNOSIS — R443 Hallucinations, unspecified: Secondary | ICD-10-CM | POA: Insufficient documentation

## 2011-05-12 DIAGNOSIS — Z01810 Encounter for preprocedural cardiovascular examination: Secondary | ICD-10-CM

## 2011-05-12 DIAGNOSIS — Z01818 Encounter for other preprocedural examination: Secondary | ICD-10-CM

## 2011-05-12 DIAGNOSIS — R0989 Other specified symptoms and signs involving the circulatory and respiratory systems: Secondary | ICD-10-CM

## 2011-05-12 DIAGNOSIS — I2510 Atherosclerotic heart disease of native coronary artery without angina pectoris: Secondary | ICD-10-CM | POA: Insufficient documentation

## 2011-05-12 DIAGNOSIS — E7800 Pure hypercholesterolemia, unspecified: Secondary | ICD-10-CM | POA: Insufficient documentation

## 2011-05-12 DIAGNOSIS — R0602 Shortness of breath: Secondary | ICD-10-CM

## 2011-05-12 DIAGNOSIS — R059 Cough, unspecified: Secondary | ICD-10-CM | POA: Insufficient documentation

## 2011-05-12 DIAGNOSIS — R0609 Other forms of dyspnea: Secondary | ICD-10-CM | POA: Insufficient documentation

## 2011-05-12 DIAGNOSIS — R079 Chest pain, unspecified: Secondary | ICD-10-CM | POA: Insufficient documentation

## 2011-05-12 DIAGNOSIS — I10 Essential (primary) hypertension: Secondary | ICD-10-CM | POA: Insufficient documentation

## 2011-05-12 DIAGNOSIS — Z87891 Personal history of nicotine dependence: Secondary | ICD-10-CM | POA: Insufficient documentation

## 2011-05-12 DIAGNOSIS — E119 Type 2 diabetes mellitus without complications: Secondary | ICD-10-CM | POA: Insufficient documentation

## 2011-05-12 DIAGNOSIS — J4489 Other specified chronic obstructive pulmonary disease: Secondary | ICD-10-CM | POA: Insufficient documentation

## 2011-05-12 DIAGNOSIS — J449 Chronic obstructive pulmonary disease, unspecified: Secondary | ICD-10-CM | POA: Insufficient documentation

## 2011-05-12 DIAGNOSIS — I5022 Chronic systolic (congestive) heart failure: Secondary | ICD-10-CM

## 2011-05-12 DIAGNOSIS — R9431 Abnormal electrocardiogram [ECG] [EKG]: Secondary | ICD-10-CM

## 2011-05-12 DIAGNOSIS — I498 Other specified cardiac arrhythmias: Secondary | ICD-10-CM | POA: Insufficient documentation

## 2011-05-12 MED ORDER — REGADENOSON 0.4 MG/5ML IV SOLN
0.4000 mg | Freq: Once | INTRAVENOUS | Status: AC
  Administered 2011-05-12: 0.4 mg via INTRAVENOUS

## 2011-05-12 MED ORDER — TECHNETIUM TC 99M TETROFOSMIN IV KIT
33.0000 | PACK | Freq: Once | INTRAVENOUS | Status: AC | PRN
  Administered 2011-05-12: 33 via INTRAVENOUS

## 2011-05-12 NOTE — Progress Notes (Signed)
[facility] SITE 3 NUCLEAR MED [address] [place] [address] [phone]  Cardiology Nuclear Med Study  [name] is a 70 y.o. male [id number] 04/30/1941   Nuclear Med Background Indication for Stress Test:  Evaluation for Ischemia History:  COPD,12/10 Echo: EF 60-65%, 07/03 Heart Catheterization: mod. N/O CAD EF 64%, 05/09 Myocardial Perfusion Study NL EF 51% and 07/11Pacemaker: SSS, Junctional Bradycardia Cardiac Risk Factors: Carotid Disease, Family History - CAD, History of Smoking, Hypertension, IDDM Type 2, Lipids and RBBB  Symptoms:  DOE   Nuclear Pre-Procedure Caffeine/Decaff Intake:  None NPO After: 10:00pm   Lungs:  clear IV 0.9% NS with Angio Cath:  22g  IV Site: R Hand  IV Started by:  Cathlyn Parsons, RN  Chest Size (in):  54 Cup Size: n/a  Height: 5\' 11"  (1.803 m)  Weight:  305 lb (138.347 kg)  BMI:  Body mass index is 42.54 kg/(m^2). Tech Comments:  No insulin taken this am;Blood sugar 210 @ 0830    Nuclear Med Study 1 or 2 day study: 2 day  Stress Test Type:  Eugenie Birks  Reading MD: Charlton Haws, MD  Order Authorizing Provider:  D.Bensimhon  Resting Radionuclide: Technetium 24m Tetrofosmin  Resting Radionuclide Dose: 33.0 mCi   Stress Radionuclide:  Technetium 11m Tetrofosmin  Stress Radionuclide Dose: 33.0 mCi           Stress Protocol Rest HR: 64 Stress HR: 65  Rest BP: 126/81 Stress BP: 126/45  Exercise Time (min): n/a METS: n/a   Predicted Max HR: 152 bpm % Max HR: 42.76 bpm Rate Pressure Product: 8190   Dose of Adenosine (mg):  n/a Dose of Lexiscan: 0.4 mg  Dose of Atropine (mg): n/a Dose of Dobutamine: n/a mcg/kg/min (at max HR)  Stress Test Technologist: Milana Na, EMT-P  Nuclear Technologist:  Domenic Polite, CNMT     Rest Procedure:  Myocardial perfusion imaging was performed at rest 45 minutes following the intravenous administration of Technetium 45m Tetrofosmin. Rest ECG: NSR-RBBB  Stress  Procedure:  The patient received IV Lexiscan 0.4 mg over 15-seconds.  Technetium 55m Tetrofosmin injected at 30-seconds.  There were no significant changes with Lexiscan.  Quantitative spect images were obtained after a 45 minute delay. Stress ECG: No significant change from baseline ECG  QPS Raw Data Images:  Normal; no motion artifact; normal heart/lung ratio. Stress Images:  Normal homogeneous uptake in all areas of the myocardium. Rest Images:  Normal homogeneous uptake in all areas of the myocardium. Subtraction (SDS):  Normal Transient Ischemic Dilatation (Normal <1.22):  1.30 Lung/Heart Ratio (Normal <0.45):  0.39  Quantitative Gated Spect Images QGS EDV:  144 ml QGS ESV:  55 ml QGS cine images:  NL LV Function; NL Wall Motion QGS EF: 62%  Impression Exercise Capacity:  Lexiscan with no exercise. BP Response:  Normal blood pressure response. Clinical Symptoms:  No chest pain. ECG Impression:  Baseline RBBB Comparison with Prior Nuclear Study: No images to compare  Overall Impression:  Normal stress nuclear study.     Charlton Haws

## 2011-05-13 ENCOUNTER — Encounter: Payer: Self-pay | Admitting: Pulmonary Disease

## 2011-05-13 ENCOUNTER — Ambulatory Visit (INDEPENDENT_AMBULATORY_CARE_PROVIDER_SITE_OTHER): Payer: Medicare Other | Admitting: Pulmonary Disease

## 2011-05-13 ENCOUNTER — Other Ambulatory Visit (HOSPITAL_COMMUNITY): Payer: Medicare Other | Admitting: Radiology

## 2011-05-13 ENCOUNTER — Ambulatory Visit (HOSPITAL_COMMUNITY): Payer: Medicare Other | Attending: Internal Medicine | Admitting: Radiology

## 2011-05-13 VITALS — BP 110/60 | HR 65 | Temp 98.0°F | Ht 71.0 in | Wt 305.8 lb

## 2011-05-13 DIAGNOSIS — G4733 Obstructive sleep apnea (adult) (pediatric): Secondary | ICD-10-CM

## 2011-05-13 DIAGNOSIS — J449 Chronic obstructive pulmonary disease, unspecified: Secondary | ICD-10-CM

## 2011-05-13 DIAGNOSIS — R0989 Other specified symptoms and signs involving the circulatory and respiratory systems: Secondary | ICD-10-CM

## 2011-05-13 MED ORDER — TECHNETIUM TC 99M TETROFOSMIN IV KIT
33.0000 | PACK | Freq: Once | INTRAVENOUS | Status: AC | PRN
  Administered 2011-05-13: 33 via INTRAVENOUS

## 2011-05-13 NOTE — Progress Notes (Signed)
  Subjective:    Patient ID: [name], male    DOB: 06-Oct-1941, 70 y.o.   MRN: [id number]  HPI The pt comes in today for surgical clearance for cholecystectomy.  He has known severe osa, and has been wearing cpap for this.  He continues to struggle with mask fit and leaks, and is describing chest tightness whenever he wears the device.  He has mild obstructive lung disease, and his description of symptoms suggests airtrapping.  He currently is set on auto.   Review of Systems  Constitutional: Negative for fever and unexpected weight change.  HENT: Positive for congestion and sinus pressure. Negative for ear pain, nosebleeds, sore throat, rhinorrhea, sneezing, trouble swallowing, dental problem and postnasal drip.   Eyes: Positive for redness. Negative for itching.  Respiratory: Positive for cough and shortness of breath. Negative for chest tightness and wheezing.   Cardiovascular: Positive for leg swelling. Negative for palpitations.  Gastrointestinal: Negative for nausea and vomiting.  Genitourinary: Negative for dysuria.  Musculoskeletal: Negative for joint swelling.  Skin: Negative for rash.  Neurological: Negative for headaches.  Hematological: Bruises/bleeds easily.  Psychiatric/Behavioral: Positive for dysphoric mood. The patient is nervous/anxious.        Objective:   Physical Exam Morbidly obese male in nad No skin breakdown or pressure necrosis from cpap mask Cor with rrr.  LE with 2+ edema, no cyanosis Awake, but appears sleepy, moves all 4        Assessment & Plan:

## 2011-05-13 NOTE — Patient Instructions (Signed)
Will refer you back to your equipment company for a mask fitting.  Will also see what pressure we need to put you on fixed pressure mode. Work on [employer] loss Will send a note to your surgeon to let her know you will require cpap post-operatively. If doing well, followup with me in one year.  Please let me know if continuing to struggle with mask fit or pressure tolerance.

## 2011-05-13 NOTE — Assessment & Plan Note (Signed)
The pt has severe osa by his sleep study, and currently is struggling with cpap on numerous fronts.  He still has not found a mask that fits him well, and is having ongoing leaks.  He is also having issues with pressure causing what sounds like airstacking.  He is on auto currently, but may do better on fixed pressure mode.  Will get him a better fitting mask, and also get a download after 2 weeks to set device on fixed pressure.  He will require cpap in the post-op period, and needs early mobilization given his morbid obesity.

## 2011-05-14 NOTE — Progress Notes (Signed)
COPY ROUTED TO DR. BENSIMHON.Falecha L Clark ° °

## 2011-05-16 ENCOUNTER — Ambulatory Visit (INDEPENDENT_AMBULATORY_CARE_PROVIDER_SITE_OTHER): Payer: Medicare Other | Admitting: *Deleted

## 2011-05-16 DIAGNOSIS — I743 Embolism and thrombosis of arteries of the lower extremities: Secondary | ICD-10-CM

## 2011-05-22 ENCOUNTER — Encounter: Payer: Self-pay | Admitting: Pulmonary Disease

## 2011-05-22 NOTE — Assessment & Plan Note (Signed)
The pt has very mild disease, and is therefore a non-issue with his upcoming surgery.

## 2011-05-27 ENCOUNTER — Telehealth: Payer: Self-pay | Admitting: Pulmonary Disease

## 2011-05-27 NOTE — Telephone Encounter (Signed)
[email or web address].Jennifer Castillo, CMA  

## 2011-05-28 ENCOUNTER — Telehealth: Payer: Self-pay | Admitting: *Deleted

## 2011-05-28 NOTE — Telephone Encounter (Signed)
Pt aware of nuclear study results 

## 2011-05-28 NOTE — Telephone Encounter (Signed)
ATC Designer, jewellery at [facility]. Waited on hold for her for over 5 mins! WCB

## 2011-05-29 NOTE — Telephone Encounter (Signed)
Spoke with [employer]. She states needs to know if pt is using his CPAP. I advised per last ov note on 05/13/11, he was not really using b/c having issues with mask. I advised that we had sent him to have mask fitting and advised him to continue his CPAP. She states that she has been trying to reach pt to make sure that he is being compliant, and she is unable to reach him. I gave her his cell number that we had listed since she did not already have this. Nothing further needed.

## 2011-05-29 NOTE — Progress Notes (Signed)
Pt aware of results, 6/20 [name]

## 2011-06-03 ENCOUNTER — Encounter (HOSPITAL_COMMUNITY)
Admission: RE | Admit: 2011-06-03 | Discharge: 2011-06-03 | Disposition: A | Discharge status: Home / Self Care | Payer: Medicare Other | Source: Ambulatory Visit | Attending: General Surgery | Admitting: General Surgery

## 2011-06-03 ENCOUNTER — Ambulatory Visit (HOSPITAL_COMMUNITY)
Admission: RE | Admit: 2011-06-03 | Discharge: 2011-06-03 | Disposition: A | Discharge status: Home / Self Care | Payer: Medicare Other | Source: Ambulatory Visit | Attending: General Surgery | Admitting: General Surgery

## 2011-06-03 ENCOUNTER — Other Ambulatory Visit (INDEPENDENT_AMBULATORY_CARE_PROVIDER_SITE_OTHER): Payer: Self-pay | Admitting: General Surgery

## 2011-06-03 DIAGNOSIS — Z01812 Encounter for preprocedural laboratory examination: Secondary | ICD-10-CM | POA: Insufficient documentation

## 2011-06-03 DIAGNOSIS — R0602 Shortness of breath: Secondary | ICD-10-CM | POA: Insufficient documentation

## 2011-06-03 DIAGNOSIS — J449 Chronic obstructive pulmonary disease, unspecified: Secondary | ICD-10-CM | POA: Insufficient documentation

## 2011-06-03 DIAGNOSIS — K8020 Calculus of gallbladder without cholecystitis without obstruction: Secondary | ICD-10-CM

## 2011-06-03 DIAGNOSIS — J4489 Other specified chronic obstructive pulmonary disease: Secondary | ICD-10-CM | POA: Insufficient documentation

## 2011-06-03 DIAGNOSIS — R05 Cough: Secondary | ICD-10-CM | POA: Insufficient documentation

## 2011-06-03 DIAGNOSIS — R059 Cough, unspecified: Secondary | ICD-10-CM | POA: Insufficient documentation

## 2011-06-03 DIAGNOSIS — Z01818 Encounter for other preprocedural examination: Secondary | ICD-10-CM | POA: Insufficient documentation

## 2011-06-03 LAB — COMPREHENSIVE METABOLIC PANEL
ALT: 78 U/L — ABNORMAL HIGH (ref 0–53)
Calcium: 9.5 mg/dL (ref 8.4–10.5)
Creatinine, Ser: 1.35 mg/dL (ref 0.50–1.35)
GFR calc Af Amer: >60 mL/min (ref >60)
GFR calc non Af Amer: 53 mL/min — ABNORMAL LOW (ref >60)
Glucose, Bld: 146 mg/dL — ABNORMAL HIGH (ref 70–99)
Sodium: 139 mEq/L (ref 135–145)
Total Protein: 6.9 g/dL (ref 6.0–8.3)

## 2011-06-03 LAB — DIFFERENTIAL
Basophils Relative: 0 % (ref 0–1)
Eosinophils Absolute: 0.3 10*3/uL (ref 0.0–0.7)
Lymphs Abs: 2.6 10*3/uL (ref 0.7–4.0)
Monocytes Absolute: 0.7 10*3/uL (ref 0.1–1.0)
Monocytes Relative: 7 % (ref 3–12)

## 2011-06-03 LAB — CBC
MCH: 31.6 pg (ref 26.0–34.0)
MCHC: 33.4 g/dL (ref 30.0–36.0)
MCV: 94.6 fL (ref 78.0–100.0)
Platelets: 235 10*3/uL (ref 150–400)

## 2011-06-05 ENCOUNTER — Other Ambulatory Visit (INDEPENDENT_AMBULATORY_CARE_PROVIDER_SITE_OTHER): Payer: Self-pay | Admitting: General Surgery

## 2011-06-05 ENCOUNTER — Ambulatory Visit (HOSPITAL_COMMUNITY)
Admission: RE | Admit: 2011-06-05 | Discharge: 2011-06-06 | Disposition: A | Discharge status: Home / Self Care | Payer: Medicare Other | Source: Ambulatory Visit | Attending: General Surgery | Admitting: General Surgery

## 2011-06-05 DIAGNOSIS — I4891 Unspecified atrial fibrillation: Secondary | ICD-10-CM | POA: Insufficient documentation

## 2011-06-05 DIAGNOSIS — K219 Gastro-esophageal reflux disease without esophagitis: Secondary | ICD-10-CM | POA: Insufficient documentation

## 2011-06-05 DIAGNOSIS — H409 Unspecified glaucoma: Secondary | ICD-10-CM | POA: Insufficient documentation

## 2011-06-05 DIAGNOSIS — Z87891 Personal history of nicotine dependence: Secondary | ICD-10-CM | POA: Insufficient documentation

## 2011-06-05 DIAGNOSIS — G4733 Obstructive sleep apnea (adult) (pediatric): Secondary | ICD-10-CM | POA: Insufficient documentation

## 2011-06-05 DIAGNOSIS — Z01818 Encounter for other preprocedural examination: Secondary | ICD-10-CM | POA: Insufficient documentation

## 2011-06-05 DIAGNOSIS — J4489 Other specified chronic obstructive pulmonary disease: Secondary | ICD-10-CM | POA: Insufficient documentation

## 2011-06-05 DIAGNOSIS — J449 Chronic obstructive pulmonary disease, unspecified: Secondary | ICD-10-CM | POA: Insufficient documentation

## 2011-06-05 DIAGNOSIS — K811 Chronic cholecystitis: Secondary | ICD-10-CM

## 2011-06-05 DIAGNOSIS — Z950 Presence of cardiac pacemaker: Secondary | ICD-10-CM | POA: Insufficient documentation

## 2011-06-05 DIAGNOSIS — E119 Type 2 diabetes mellitus without complications: Secondary | ICD-10-CM | POA: Insufficient documentation

## 2011-06-05 DIAGNOSIS — Z01812 Encounter for preprocedural laboratory examination: Secondary | ICD-10-CM | POA: Insufficient documentation

## 2011-06-05 DIAGNOSIS — K8010 Calculus of gallbladder with chronic cholecystitis without obstruction: Secondary | ICD-10-CM | POA: Insufficient documentation

## 2011-06-05 DIAGNOSIS — E669 Obesity, unspecified: Secondary | ICD-10-CM | POA: Insufficient documentation

## 2011-06-05 LAB — GLUCOSE, CAPILLARY
Glucose-Capillary: 97 mg/dL (ref 70–99)
Glucose-Capillary: 133 mg/dL — ABNORMAL HIGH (ref 70–99)
Glucose-Capillary: 214 mg/dL — ABNORMAL HIGH (ref 70–99)

## 2011-06-05 LAB — PROTIME-INR: Prothrombin Time: 13.5 seconds (ref 11.6–15.2)

## 2011-06-10 ENCOUNTER — Ambulatory Visit (INDEPENDENT_AMBULATORY_CARE_PROVIDER_SITE_OTHER): Payer: Medicare Other | Admitting: *Deleted

## 2011-06-10 DIAGNOSIS — I743 Embolism and thrombosis of arteries of the lower extremities: Secondary | ICD-10-CM

## 2011-06-12 ENCOUNTER — Encounter (INDEPENDENT_AMBULATORY_CARE_PROVIDER_SITE_OTHER): Payer: Self-pay | Admitting: General Surgery

## 2011-06-12 ENCOUNTER — Ambulatory Visit (INDEPENDENT_AMBULATORY_CARE_PROVIDER_SITE_OTHER): Payer: Medicare Other | Admitting: General Surgery

## 2011-06-12 DIAGNOSIS — K8020 Calculus of gallbladder without cholecystitis without obstruction: Secondary | ICD-10-CM

## 2011-06-12 NOTE — Progress Notes (Addendum)
Subjective:     Patient ID: [name], male   DOB: 06/06/1941, 70 y.o.   MRN: [id number]    BP 160/58  Pulse 72  Temp(Src) 97.2 F (36.2 C) (Temporal)  Ht 5\' 11"  (1.803 m)  Wt 313 lb (141.976 kg)  BMI 43.65 kg/m2    HPI This patient returns today 7 days status post laparoscopic cholecystectomy for cholelithiasis and history of pancreatitis performed June 28 by Dr. Freida Busman. He is here for postop evaluation and possible removal of his drain. He states that he's been doing well and his pain continues to decrease daily. He has not taken pain medicine recently. He denies any fevers or chills. Denies any jaundice. He is tolerating regular diet without difficulty. He has since resumed his Coumadin therapy. He has been recor ading drain outputs and has had decreasing amounts daily.   Review of Systems     Objective:   Physical Exam  Constitutional: He appears well-developed and well-nourished. No distress.  Eyes: Conjunctivae are normal.  Pulmonary/Chest: No respiratory distress. He has no wheezes.  Abdominal: Soft. Bowel sounds are normal. He exhibits no distension and no mass. There is no tenderness. There is no rebound and no guarding.       His abdomen is soft, nontender, and nondistended His incisions are healing well without sign of infection. He has a small amount of drainage around his right upper quadrant drain site but no evidence of infection. His JP drain has some serosanguineous output without evidence of gross blood. He does have a brownish tint to the fluid which may be due to the Surgicel gauze or possible bile leak.  Skin: No rash noted. He is not diaphoretic. No erythema.       Assessment:     Status post laparoscopic cholecystectomy    Plan:     Overall he looks very well. He seems to be progressing appropriately. Pathology was benign. Given the appearance of the drained fluid and the brownish tint I would like to send a sample of fluid to pathology for bilirubin level  prior to removing the drain. This may be due to the Surgicel gauze but I cannot rule out bile leak. However, he does look well and does not have clinical evidence of bile leak. He will followup tomorrow in my clinic for possible drain removal if the bilirubin level is normal. Otherwise, I recommend continued Coumadin therapy.     I called today to follow up the lab result and the lab stated that it could not be run on the fluid sent.  No lab was willing to draw the fluid for new analysis.  Speaking with Mr. Kurowski on the phone, he states that the drain is not working now and leaking around the entry site.  We will plan for HIDA scan today to rule out bile leak and if okay we will remove the drain.

## 2011-06-12 NOTE — Patient Instructions (Signed)
Call at 11am prior to visit to ensure labs have returned. Continue drain care.

## 2011-06-13 ENCOUNTER — Encounter (HOSPITAL_COMMUNITY): Payer: Self-pay

## 2011-06-13 ENCOUNTER — Ambulatory Visit (HOSPITAL_COMMUNITY)
Admission: RE | Admit: 2011-06-13 | Discharge: 2011-06-13 | Disposition: A | Discharge status: Home / Self Care | Payer: Medicare Other | Source: Ambulatory Visit | Attending: General Surgery | Admitting: General Surgery

## 2011-06-13 ENCOUNTER — Encounter: Payer: Medicare Other | Admitting: *Deleted

## 2011-06-13 ENCOUNTER — Encounter (INDEPENDENT_AMBULATORY_CARE_PROVIDER_SITE_OTHER): Payer: Self-pay | Admitting: Surgery

## 2011-06-13 ENCOUNTER — Ambulatory Visit (INDEPENDENT_AMBULATORY_CARE_PROVIDER_SITE_OTHER): Payer: Medicare Other | Admitting: Surgery

## 2011-06-13 VITALS — BP 136/60 | HR 72 | Temp 98.2°F | Ht 69.0 in | Wt 308.0 lb

## 2011-06-13 DIAGNOSIS — Z9089 Acquired absence of other organs: Secondary | ICD-10-CM | POA: Insufficient documentation

## 2011-06-13 DIAGNOSIS — K859 Acute pancreatitis without necrosis or infection, unspecified: Secondary | ICD-10-CM

## 2011-06-13 DIAGNOSIS — R109 Unspecified abdominal pain: Secondary | ICD-10-CM | POA: Insufficient documentation

## 2011-06-13 LAB — CBC WITH DIFFERENTIAL/PLATELET
Eosinophils Absolute: 0.2 10*3/uL (ref 0.0–0.7)
Eosinophils Relative: 2 % (ref 0–5)
HCT: 43.6 % (ref 39.0–52.0)
Hemoglobin: 14.2 g/dL (ref 13.0–17.0)
Lymphs Abs: 2.6 10*3/uL (ref 0.7–4.0)
MCH: 31.6 pg (ref 26.0–34.0)
MCV: 97.1 fL (ref 78.0–100.0)
Monocytes Relative: 9 % (ref 3–12)
RBC: 4.49 MIL/uL (ref 4.22–5.81)

## 2011-06-13 LAB — COMPREHENSIVE METABOLIC PANEL
CO2: 23 mEq/L (ref 19–32)
Glucose, Bld: 117 mg/dL — ABNORMAL HIGH (ref 70–99)
Sodium: 138 mEq/L (ref 135–145)
Total Bilirubin: 0.5 mg/dL (ref 0.3–1.2)
Total Protein: 7 g/dL (ref 6.0–8.3)

## 2011-06-13 LAB — LIPASE: Lipase: 18 U/L (ref 0–75)

## 2011-06-13 MED ORDER — TECHNETIUM TC 99M MEBROFENIN IV KIT
5.5000 | PACK | Freq: Once | INTRAVENOUS | Status: AC | PRN
  Administered 2011-06-13: 5.5 via INTRAVENOUS

## 2011-06-13 MED ORDER — OXYCODONE-ACETAMINOPHEN 5-325 MG PO TABS: 1.0000 | ORAL_TABLET | ORAL | Status: DC | PRN

## 2011-06-13 NOTE — Progress Notes (Signed)
Addended by: Lodema Pilot D on: 06/13/2011 11:47 AM   Modules accepted: Orders

## 2011-06-13 NOTE — Progress Notes (Signed)
The patient was seen in urgent office after his HIDA scan.The HIDA scan showed that he had normal filling of his biliary tree. None of the dye does not enter the general peritoneal cavity or the drain. However there is some activity in the subhepatic space in the gallbladder fossa which may be a small contained bile leak and the cholecystectomy bed. I removed the drain as it does not seem to be functioning and was not draining bile. Overall the patient does not seem to be feeling well. He continues to have some right upper quadrant discomfort. His appetite is poor. We will send him for labs this afternoon. I will ask that the lab results be relayed to our on-call physician tonight as it is 5 PM on Friday afternoon. If they are abnormal the patient may require hospitalization for further evaluation.

## 2011-06-15 ENCOUNTER — Emergency Department (HOSPITAL_COMMUNITY)
Admission: EM | Admit: 2011-06-15 | Discharge: 2011-06-16 | Disposition: A | Discharge status: Home / Self Care | Payer: Medicare Other | Attending: Emergency Medicine | Admitting: Emergency Medicine

## 2011-06-15 DIAGNOSIS — R109 Unspecified abdominal pain: Secondary | ICD-10-CM | POA: Insufficient documentation

## 2011-06-15 DIAGNOSIS — E785 Hyperlipidemia, unspecified: Secondary | ICD-10-CM | POA: Insufficient documentation

## 2011-06-15 DIAGNOSIS — I10 Essential (primary) hypertension: Secondary | ICD-10-CM | POA: Insufficient documentation

## 2011-06-15 DIAGNOSIS — E119 Type 2 diabetes mellitus without complications: Secondary | ICD-10-CM | POA: Insufficient documentation

## 2011-06-15 DIAGNOSIS — Z79899 Other long term (current) drug therapy: Secondary | ICD-10-CM | POA: Insufficient documentation

## 2011-06-15 DIAGNOSIS — K219 Gastro-esophageal reflux disease without esophagitis: Secondary | ICD-10-CM | POA: Insufficient documentation

## 2011-06-15 DIAGNOSIS — Y836 Removal of other organ (partial) (total) as the cause of abnormal reaction of the patient, or of later complication, without mention of misadventure at the time of the procedure: Secondary | ICD-10-CM | POA: Insufficient documentation

## 2011-06-16 ENCOUNTER — Emergency Department (HOSPITAL_COMMUNITY): Payer: Medicare Other

## 2011-06-16 LAB — URINE MICROSCOPIC-ADD ON

## 2011-06-16 LAB — PROTIME-INR
INR: 1.66 — ABNORMAL HIGH (ref 0.00–1.49)
Prothrombin Time: 19.9 seconds — ABNORMAL HIGH (ref 11.6–15.2)

## 2011-06-16 LAB — COMPREHENSIVE METABOLIC PANEL
ALT: 41 U/L (ref 0–53)
AST: 46 U/L — ABNORMAL HIGH (ref 0–37)
Albumin: 3 g/dL — ABNORMAL LOW (ref 3.5–5.2)
Alkaline Phosphatase: 99 U/L (ref 39–117)
Potassium: 4 mEq/L (ref 3.5–5.1)
Sodium: 136 mEq/L (ref 135–145)
Total Protein: 7.2 g/dL (ref 6.0–8.3)

## 2011-06-16 LAB — URINALYSIS, ROUTINE W REFLEX MICROSCOPIC
Bilirubin Urine: NEGATIVE
Specific Gravity, Urine: 1.017 (ref 1.005–1.030)
pH: 6 (ref 5.0–8.0)

## 2011-06-16 LAB — CBC
MCV: 94.4 fL (ref 78.0–100.0)
Platelets: 293 10*3/uL (ref 150–400)
RBC: 4.08 MIL/uL — ABNORMAL LOW (ref 4.22–5.81)
WBC: 11.4 10*3/uL — ABNORMAL HIGH (ref 4.0–10.5)

## 2011-06-16 LAB — DIFFERENTIAL
Basophils Relative: 0 % (ref 0–1)
Eosinophils Absolute: 0.3 10*3/uL (ref 0.0–0.7)
Lymphs Abs: 2.7 10*3/uL (ref 0.7–4.0)
Neutrophils Relative %: 67 % (ref 43–77)

## 2011-06-16 MED ORDER — IOHEXOL 300 MG/ML  SOLN
80.0000 mL | Freq: Once | INTRAMUSCULAR | Status: AC | PRN
  Administered 2011-06-16: 80 mL via INTRAVENOUS

## 2011-06-17 NOTE — Op Note (Signed)
[name], [name] NO.:  [id number]  MEDICAL RECORD NO.:  [id number]  LOCATION:  3738                         FACILITY:  [facility]  PHYSICIAN:  Lennie Muckle, MD      DATE OF BIRTH:  1941/01/04  DATE OF PROCEDURE:  06/05/2011 DATE OF DISCHARGE:                              OPERATIVE REPORT   PREOPERATIVE DIAGNOSES:  History of gallstone pancreatitis and pseudocyst, cholelithiasis.  POSTOPERATIVE DIAGNOSES:  History of gallstone pancreatitis and pseudocyst, cholelithiasis.  PROCEDURE:  Laparoscopic cholecystectomy.  SURGEON:  Lennie Muckle, MD  ASSISTANT:  Clovis Pu. Cornett, MD  FINDINGS:  Large fatty liver, multiple stones within the gallbladder.  SPECIMEN:  Gallbladder.  ESTIMATED BLOOD LOSS:  Approximately 50.  No immediate complications.  A 19-Blake drain was placed within the gallbladder fossa.  INDICATIONS FOR PROCEDURE:  [name] is a 70 year old male who was previously admitted to [facility] with episodes of pancreatitis in November.  A pseudocyst had developed along the greater curve of the stomach.  He had multiple episodes of epigastric and left abdominal pain after eating.  He had been on omeprazole for reflux disease.  He had no ductal dilatation on the last of his imaging studies and his liver function panel was normal.  He was seen preoperatively and cleared by Dr. Gala Romney with Cardiology and Dr. Everardo All with Pulmonary.  He does have sleep apnea and a pacemaker.  DETAILS OF PROCEDURE:  [name] was identified in the preoperative holding area, seen by Anesthesia, evaluated for his pacemaker.  He was given IV antibiotics and taken to the operating room.  Once in the operating room, placed in supine position.  Sequential compression devices were applied to his lower extremity, placed under general endotracheal anesthesia.  The abdomen was clipped, prepped and draped in the usual sterile fashion.  A surgical time-out was  performed.  I began by placed an incision in the right upper quadrant using a 5-mm camera and the trocar.  I used the OptiVu technique to gain entry into abdominal cavity.  All layers of abdominal wall were visualized upon entry.  After insufflating the abdomen, I inspected the abdomen.  There was a small injury to the capsule of the liver due to the location of the trocar.  I did not notice any other evidence of injury.  I placed a 5-mm trocar at the midline above the umbilicus.  The camera was placed at the incision in the midline.  I placed a 11-mm trocar in the epigastric region after the patient was placed in right side up reverse Trendelenburg position.  Another trocar was placed in the right side of the abdomen with visualization with camera, used electrocautery to cauterize the capsule of the liver.  This controlled the oozing.  I then removed the omentum away from the liver to gain visualization of the gallbladder.  The liver was rather large and heavy, somewhat nodular in some areas.  We had to place an additional trocar in the right lowerabdomen in order to get better visualization of the gallbladder and the area of the infundibulum.  The fundus of gallbladder was retracted as best we could to  the head of the patient.  Dr. Luisa Hart then pulled down on the omentum, the stomach, and duodenum which were somewhat loosely adherent to the gallbladder.  I used electrocautery to sweep these structures away from the gallbladder.  I continued dissecting on the gallbladder wall coursing down towards the infundibulum and cystic duct area.  It was buried somewhat in the cleft of the liver.  I was able to visualize the medial branch of the cystic artery without difficulty. This was clipped and divided.  I then continued dissecting posteriorly. I then freed up the edges of the gallbladder on the right side.  The gallbladder was somewhat floppy and due to large size of the liver made it somewhat  difficult to dissect.  I did make an injury in the gallbladder at the infundibulum.  I continued dissecting until I was able to obtain a safe vision safe view of the cystic duct.  I did not think it was feasible to perform a cholangiogram due to the poor visualization and fear of having an injury.  I was able to visualize the cystic duct.  It appeared small and I did not see any stones within it. I then placed three clips proximally, one distally, and transected the cystic duct.  It was high on the gallbladder.  There was a small posterior branch of cystic artery, which I clipped.  I then continued dissecting the gallbladder with blunt dissection and electrocautery. The gallbladder was placed in an EndoCatch bag after removal.  During the dissection, another segment of the liver capsule tore, but was not profusely bleeding.  I used electrocautery to control the superficial areas.  We irrigated within the gallbladder fossa.  There was no bleeding nor significant amount of oozing.  It appeared mostly dry despite the raw surface.  I did place a layer of Surgicel and surgical SnoW within the wound bed because the patient will have to resume his Coumadin postoperatively.  I then placed a 19-Blake drain within the gallbladder fossa.  We irrigated once again, found no bile leakage or bleeding.  The gallbladder was removed from the abdomen.  The drain was secured with a 2-0 nylon suture on the right side of the abdomen.  I then closed the fascial defect at the epigastric region using 0 Vicryl suture and the suture passer.  Once the fascia was closed, I injected 30 mL of 0.25% Marcaine at all sites for local block.  I then released pneumoperitoneum, removed the trocars.  Skin was closed with 4-0 Monocryl.  Dry gauze was placed around the drain and Steri-Strips were placed on the remaining suture site.  The patient was extubated, transferred to postanesthesia care unit in stable condition.  He will  be monitored today and then likely discharged home tomorrow with his drain. He will follow up next week for possible removal of the drain.     Lennie Muckle, MD     ALA/MEDQ  D:  06/05/2011  T:  06/06/2011  Job:  [id number]  cc:   Barry Dienes. Eloise Harman, M.D. Bevelyn Buckles. Bensimhon, MD Dr. Isabella Stalling  Electronically Signed by Bertram Savin MD on 06/17/2011 11:41:57 AM

## 2011-06-17 NOTE — Discharge Summary (Signed)
  [name], [name] NO.:  [id number]  MEDICAL RECORD NO.:  [id number]  LOCATION:  3738                         FACILITY:  [facility]  PHYSICIAN:  Lennie Muckle, MD      DATE OF BIRTH:  [birth year]  DATE OF ADMISSION:  06/05/2011 DATE OF DISCHARGE:  06/06/2011                              DISCHARGE SUMMARY   FINAL DIAGNOSES:  History of gallstone pancreatitis, cholelithiasis with a procedure laparoscopic cholecystectomy.  SURGEON:  Catheline Hixon L. Freida Busman, MD  DISCHARGE CONDITION:  Good.  MEDICATIONS:  All home medications and Percocet was given for pain.  HOSPITAL COURSE:  [name] was admitted following his cholecystectomy on June 05, 2011.  He had had some sleep apnea and was on a CPAP at night due to his cardiac history and wanted to monitor him overnight. He had also been on Coumadin which was held prior to the procedure.  A JP was placed in the gallbladder fossa just to catch any residual fluid and possible continued oozing due to having to restart his Coumadin.  He has done well overnight, had no significant amounts of pain.  He does have some discomfort at the JP site.  The incisions are without any evidence of erythema.  He is instructed to keep a journal of his JP output.  I suspect we will probably able to have this taken out next Thursday.  He is going to follow up with Dr. Biagio Quint in the clinic.  He will call if he develops any fevers or chills, increasing abdominal pain.  He should follow up with his primary care physician, Dr. Rinaldo Cloud as well as Dr. Gala Romney as he usually does.     Lennie Muckle, MD     ALA/MEDQ  D:  06/06/2011  T:  06/07/2011  Job:  [id number]  cc:   Bevelyn Buckles. Bensimhon, MD Sean A. Everardo All, MD Vania Rea. Jarold Motto, MD, Caleen Essex, FAGA  Electronically Signed by Bertram Savin MD on 06/17/2011 11:41:31 AM

## 2011-06-19 ENCOUNTER — Encounter (INDEPENDENT_AMBULATORY_CARE_PROVIDER_SITE_OTHER): Payer: Self-pay | Admitting: General Surgery

## 2011-06-19 ENCOUNTER — Encounter: Payer: Self-pay | Admitting: Internal Medicine

## 2011-06-20 ENCOUNTER — Encounter (INDEPENDENT_AMBULATORY_CARE_PROVIDER_SITE_OTHER): Payer: Self-pay | Admitting: General Surgery

## 2011-06-20 ENCOUNTER — Ambulatory Visit (INDEPENDENT_AMBULATORY_CARE_PROVIDER_SITE_OTHER): Payer: Medicare Other | Admitting: General Surgery

## 2011-06-20 VITALS — Temp 97.1°F

## 2011-06-20 DIAGNOSIS — Z5189 Encounter for other specified aftercare: Secondary | ICD-10-CM

## 2011-06-20 DIAGNOSIS — R1011 Right upper quadrant pain: Secondary | ICD-10-CM

## 2011-06-20 DIAGNOSIS — Z4889 Encounter for other specified surgical aftercare: Secondary | ICD-10-CM

## 2011-06-20 MED ORDER — OXYCODONE-ACETAMINOPHEN 7.5-325 MG PO TABS: 1.0000 | ORAL_TABLET | ORAL | Status: AC | PRN

## 2011-06-20 NOTE — Progress Notes (Signed)
Subjective:     Patient ID: [name], male   DOB: 11/30/1941, 70 y.o.   MRN: [id number]    Temp(Src) 97.1 F (36.2 C) (Temporal)    HPI He presents today for followup of left upper cholecystectomy and drain placement 2 weeks ago. He had a dtrain removed a week ago and was seen in the emergency room on Sunday by Dr. Lindie Spruce He had a CT scan of the abdomen at that time which demonstrated residual fluid collection in the gallbladder fossa but was sent home. He states that since then he has been improving although slowly and still has right upper quadrant and right flank discomfort. He denies any fevers, and chills and states that his bowels are functioning well His pain is controlled with Percocet.  Review of Systems     Objective:   Physical Exam He is in no acute distress and nontoxic-appearing  His abdomen is soft and nondistended his incisions are healing well without sign of infection he has mild tenderness in the right flank at the costal margin but no evidence of peritonitis.    Assessment:     Postop cholecystectomy     Plan:    He still has some right sided flank and abdominal pain but states that he is improving but slower than anticipated. He is afebrile and tolerating diet. His abdominal exam is appropriate. I would like to see him improving faster and I did not see any signs of any significant postoperative problem or palpitation. He does have the fluid collection seen on CT scan of uncertain origin. This is most likely physiologic postoperative fluid or possible hematoma, I doubt that this is abscess or bowel injury since he does seem to be improving, remained afebrile and is tolerating diet without any nausea. I think that he will continue to improve with time. I recommended that he follow up in one week for further evaluation and at that time we will obtain laboratory studies and if he is not continuing to improve we will repeat his CT scan to evaluate growth or resolution of  the right quadrant fluid collection.

## 2011-06-24 ENCOUNTER — Other Ambulatory Visit: Payer: Self-pay | Admitting: Pulmonary Disease

## 2011-06-24 DIAGNOSIS — G4733 Obstructive sleep apnea (adult) (pediatric): Secondary | ICD-10-CM

## 2011-06-25 ENCOUNTER — Encounter: Payer: Self-pay | Admitting: Internal Medicine

## 2011-06-25 ENCOUNTER — Ambulatory Visit (INDEPENDENT_AMBULATORY_CARE_PROVIDER_SITE_OTHER): Payer: Medicare Other | Admitting: Internal Medicine

## 2011-06-25 ENCOUNTER — Ambulatory Visit (INDEPENDENT_AMBULATORY_CARE_PROVIDER_SITE_OTHER): Payer: Medicare Other | Admitting: *Deleted

## 2011-06-25 DIAGNOSIS — I4891 Unspecified atrial fibrillation: Secondary | ICD-10-CM | POA: Insufficient documentation

## 2011-06-25 DIAGNOSIS — I743 Embolism and thrombosis of arteries of the lower extremities: Secondary | ICD-10-CM

## 2011-06-25 DIAGNOSIS — I10 Essential (primary) hypertension: Secondary | ICD-10-CM

## 2011-06-25 DIAGNOSIS — I5033 Acute on chronic diastolic (congestive) heart failure: Secondary | ICD-10-CM

## 2011-06-25 DIAGNOSIS — I5032 Chronic diastolic (congestive) heart failure: Secondary | ICD-10-CM

## 2011-06-25 DIAGNOSIS — I498 Other specified cardiac arrhythmias: Secondary | ICD-10-CM

## 2011-06-25 DIAGNOSIS — I509 Heart failure, unspecified: Secondary | ICD-10-CM

## 2011-06-25 LAB — BASIC METABOLIC PANEL
BUN: 25 mg/dL — ABNORMAL HIGH (ref 6–23)
CO2: 29 mEq/L (ref 19–32)
Calcium: 9.3 mg/dL (ref 8.4–10.5)
Creatinine, Ser: 1.3 mg/dL (ref 0.4–1.5)
GFR: 59.36 mL/min — ABNORMAL LOW (ref >60.00)
Glucose, Bld: 48 mg/dL — CL (ref 70–99)
Sodium: 142 mEq/L (ref 135–145)

## 2011-06-25 LAB — PACEMAKER DEVICE OBSERVATION
AL IMPEDENCE PM: 462.5 Ohm
AL THRESHOLD: 0.625 V
ATRIAL PACING PM: 71
DEVICE MODEL PM: 7152830
RV LEAD AMPLITUDE: 8.1 mv
RV LEAD IMPEDENCE PM: 450 Ohm
RV LEAD THRESHOLD: 1 V

## 2011-06-25 LAB — POCT INR: INR: 2

## 2011-06-25 NOTE — Assessment & Plan Note (Signed)
Normal pacemaker function See Pace Art report No changes today  

## 2011-06-25 NOTE — Assessment & Plan Note (Signed)
afib has been documented by his pacemaker, though episodes are short and he is minimally sypmtomatic Continue coumadin longterm.

## 2011-06-25 NOTE — Assessment & Plan Note (Signed)
He has elevated BP and diastolic dysfunction.  I am not sure that he needs florinef at this time.  I will decrease florinef to every other day.  We will check BMET today. He has scheduled follow-up with Dr Everardo All in a few weeks.  He should consider consider stopping florinef if stable at that time

## 2011-06-25 NOTE — Progress Notes (Signed)
The patient presents today for routine electrophysiology followup.  Since last being seen in our clinic, the patient reports doing very well.  He is now recovering from recent gall bladder surgery.  He continues to struggle with weight loss.  He has stable edema and mild SOB.  Today, he denies symptoms of palpitations, chest pain, orthopnea, PND,  dizziness, presyncope, syncope, or neurologic sequela.  The patient feels that he is tolerating medications without difficulties and is otherwise without complaint today.   Past Medical History  Diagnosis Date  . Diabetes mellitus   . Hypertension   . COPD (chronic obstructive pulmonary disease)     CPAP  . Renal insufficiency   . Hyperlipemia   . Anemia   . Other and unspecified coagulation defects   . Hypothyroidism   . CAD (coronary artery disease)   . Hyperkalemia   . Osteoarthritis   . Glaucoma   . Sick sinus syndrome     s/p PPM by JA  . Pancreatitis   . Obesity   . Constipation   . OSA on CPAP     using CPAP although it is hard for him to tolerate  . SOB (shortness of breath)    Past Surgical History  Procedure Date  . Nasal septum surgery 1967  . Pacemaker placement 06/2010    [facility] [employer] RF DR, Model [id number] ( Serial number [id number])  . Thromboembolectomy and four compartment fasciotomy 2009  . Cholecystectomy     Current Outpatient Prescriptions  Medication Sig Dispense Refill  . Ascorbic Acid (VITAMIN C) 500 MG tablet Take 500 mg by mouth daily.        Marland Kitchen aspirin 81 MG tablet Take 81 mg by mouth daily.        . Bimatoprost (LUMIGAN) 0.01 % SOLN Apply to eye. Once daily-one drop both eyes       . brimonidine (ALPHAGAN P) 0.1 % SOLN One drop both eyes twice daily       . Cholecalciferol (VITAMIN D3) 400 UNITS CAPS Take 1,000 Units by mouth daily.       Marland Kitchen dextromethorphan-guaiFENesin (MUCINEX DM) 30-600 MG per 12 hr tablet Take 1 tablet by mouth as needed.       . dorzolamide (TRUSOPT) 2 % ophthalmic solution 1  drop. One drop both eyes twice daily       . fenofibrate micronized (LOFIBRA) 200 MG capsule Take 200 mg by mouth.        . fludrocortisone (FLORINEF) 0.1 MG tablet 1 tablet by mouth once daily  30 tablet  3  . furosemide (LASIX) 20 MG tablet Take 20 mg by mouth daily.        Marland Kitchen glucose blood test strip 4 (four) times daily. Use as instructed      . Insulin Regular Human (HUMULIN R U-500, CONCENTRATED, [place]) Inject into the skin. Injections are four times a day....... 220 units in the morning, 150 units at lunch, 220 units at dinner, and 200 units at bedtime      . Insulin Syringe-Needle U-100 (B-D INS SYR ULTRAFINE 1CC/30G) 30G X 1/2" 1 ML MISC 4x a day for insulin      . levothyroxine (SYNTHROID, LEVOTHROID) 50 MCG tablet Take 50 mcg by mouth daily.        . Multiple Vitamin (MULTIVITAMIN) tablet Take 1 tablet by mouth daily.        . OMEGA-3 1000 MG CAPS Take 1 capsule by mouth daily.       Marland Kitchen  omeprazole (PRILOSEC) 20 MG capsule Take 20 mg by mouth daily.       Marland Kitchen oxyCODONE-acetaminophen (PERCOCET) 7.5-325 MG per tablet Take 1 tablet by mouth every 4 (four) hours as needed for pain.  30 tablet  0  . Pancrelipase, Lip-Prot-Amyl, (CREON) 24000 UNITS CPEP Take by mouth. Two capsules by mouth three times a day       . Probiotic Product (PROBIOTIC ACIDOPHILUS) CAPS Take by mouth.        . rosuvastatin (CRESTOR) 20 MG tablet Take 20 mg by mouth. Only take Monday, Wed and Friday       . ursodiol (ACTIGALL) 300 MG capsule Take 300 mg by mouth 2 (two) times daily.        Marland Kitchen warfarin (COUMADIN) 4 MG tablet Take 4 mg by mouth daily. As directed        Current Facility-Administered Medications  Medication Dose Route Frequency Provider Last Rate Last Dose  . cyanocobalamin ((VITAMIN B-12)) injection 1,000 mcg  1,000 mcg Intramuscular Q30 days Sheryn Bison, MD   1,000 mcg at 03/13/11 1117    Allergies  Allergen Reactions  . Pioglitazone     ACTOS  REACTION: Edema    History   Social History  .  Marital Status: Married    Spouse Name: N/A    Number of Children: N/A  . 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 Smoking status: Former Smoker -- 2.0 packs/day for 50 years    Types: Cigarettes    Quit date: 12/08/2006  . Smokeless tobacco: Never Used  . Alcohol Use: No  . Drug Use: No  . Sexually Active: Not on file   Other Topics Concern  . Not on file   Social History Narrative  . No narrative on file    Family History  Problem Relation Age of Onset  . Liver cancer Mother     deceased age 49  . Cancer Mother     liver cancer  . Colon cancer Neg Hx   . Heart attack Father     ROS-  All systems are reviewed and are negative except as outlined in the HPI above    Physical Exam: Filed Vitals:   06/25/11 0903  BP: 154/75  Pulse: 60  Height: 5\' 9"  (1.753 m)  Weight: 314 lb (142.429 kg)    GEN- The patient is overweight appearing, alert and oriented x 3 today.   Head- normocephalic, atraumatic Eyes-  Sclera clear, conjunctiva pink Ears- hearing intact Oropharynx- clear Neck- supple, no JVP Lymph- no cervical lymphadenopathy Lungs- Clear to ausculation bilaterally, normal work of breathing Chest- pacemaker pocket is well healed Heart- Regular rate and rhythm, no murmurs, rubs or gallops, PMI not laterally displaced GI- soft, NT, ND, + BS Extremities- no clubbing, cyanosis, 1+ chronic BLE edema MS- no significant deformity or atrophy Skin- no rash or lesion Psych- euthymic mood, full affect Neuro- strength and sensation are intact  Pacemaker interrogation- reviewed in detail today,  See PACEART report  Assessment and Plan:

## 2011-06-25 NOTE — Assessment & Plan Note (Signed)
Weight loss advised 

## 2011-06-25 NOTE — Patient Instructions (Signed)
Your physician wants you to follow-up in: 6 months with Dr Court Joy will receive a reminder letter in the mail two months in advance. If you don't receive a letter, please call our office to schedule the follow-up appointment.  Your physician has recommended you make the following change in your medication: decrease Florinef to every other day  Your physician recommends that you return for lab work today: BMP

## 2011-06-25 NOTE — Assessment & Plan Note (Signed)
Decrease florinef as above Continue lasix

## 2011-06-26 ENCOUNTER — Ambulatory Visit: Payer: Medicare Other | Admitting: Cardiovascular Disease

## 2011-06-27 ENCOUNTER — Encounter (INDEPENDENT_AMBULATORY_CARE_PROVIDER_SITE_OTHER): Payer: Medicare Other | Admitting: General Surgery

## 2011-07-02 ENCOUNTER — Other Ambulatory Visit: Payer: Self-pay | Admitting: Endocrinology

## 2011-07-11 ENCOUNTER — Ambulatory Visit (INDEPENDENT_AMBULATORY_CARE_PROVIDER_SITE_OTHER): Payer: Medicare Other | Admitting: *Deleted

## 2011-07-11 DIAGNOSIS — I743 Embolism and thrombosis of arteries of the lower extremities: Secondary | ICD-10-CM

## 2011-07-22 ENCOUNTER — Other Ambulatory Visit: Payer: Self-pay | Admitting: Internal Medicine

## 2011-07-25 ENCOUNTER — Other Ambulatory Visit: Payer: Self-pay | Admitting: Internal Medicine

## 2011-08-04 ENCOUNTER — Other Ambulatory Visit (INDEPENDENT_AMBULATORY_CARE_PROVIDER_SITE_OTHER): Payer: Medicare Other

## 2011-08-04 ENCOUNTER — Ambulatory Visit (INDEPENDENT_AMBULATORY_CARE_PROVIDER_SITE_OTHER): Payer: Medicare Other | Admitting: Endocrinology

## 2011-08-04 ENCOUNTER — Encounter: Payer: Self-pay | Admitting: Endocrinology

## 2011-08-04 VITALS — BP 124/60 | HR 72 | Temp 98.6°F | Ht 70.0 in | Wt 307.4 lb

## 2011-08-04 DIAGNOSIS — E109 Type 1 diabetes mellitus without complications: Secondary | ICD-10-CM

## 2011-08-04 LAB — HEMOGLOBIN A1C: Hgb A1c MFr Bld: 9.2 % — ABNORMAL HIGH (ref 4.6–6.5)

## 2011-08-04 NOTE — Progress Notes (Signed)
Subjective:    Patient ID: [name], male    DOB: 1941/01/17, 70 y.o.   MRN: [id number]  HPI pt states he feels much better since his cholescystecomy.  he brings a record of his cbg's which i have reviewed today.  He had 2 episodes of hypoglycemia, and both were mild.  Both were at 2-4 am.  cbg's are reported as a range of 80-150.  There is no trend throughout the day. Past Medical History  Diagnosis Date  . Diabetes mellitus   . Hypertension   . COPD (chronic obstructive pulmonary disease)     CPAP  . Renal insufficiency   . Hyperlipemia   . Anemia   . Other and unspecified coagulation defects   . Hypothyroidism   . CAD (coronary artery disease)   . Hyperkalemia   . Osteoarthritis   . Glaucoma   . Sick sinus syndrome     s/p PPM by JA  . Pancreatitis   . Obesity   . Constipation   . OSA on CPAP     using CPAP although it is hard for him to tolerate  . SOB (shortness of breath)     Past Surgical History  Procedure Date  . Nasal septum surgery 1967  . Pacemaker placement 06/2010    [facility] Accent RF DR, Model [id number] ( Serial number [id number])  . Thromboembolectomy and four compartment fasciotomy 2009  . Cholecystectomy     History   Social History  . Marital Status: Married    Spouse Name: N/A    Number of Children: N/A  . 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 Smoking status: Former Smoker -- 2.0 packs/day for 50 years    Types: Cigarettes    Quit date: 12/08/2006  . Smokeless tobacco: Never Used  . Alcohol Use: No  . Drug Use: No  . Sexually Active: Not on file   Other Topics Concern  . Not on file   Social History Narrative  . No narrative on file    Current Outpatient Prescriptions on File Prior to Visit  Medication Sig Dispense Refill  . Ascorbic Acid (VITAMIN C) 500 MG tablet Take 500 mg by mouth daily.        Marland Kitchen aspirin 81 MG tablet Take 81 mg by mouth daily.         . Bimatoprost (LUMIGAN) 0.01 % SOLN Apply to eye. Once daily-one drop both eyes       . brimonidine (ALPHAGAN P) 0.1 % SOLN One drop both eyes twice daily       . Cholecalciferol (VITAMIN D3) 400 UNITS CAPS Take 1,000 Units by mouth daily.       Marland Kitchen dextromethorphan-guaiFENesin (MUCINEX DM) 30-600 MG per 12 hr tablet Take 1 tablet by mouth as needed.       . dorzolamide (TRUSOPT) 2 % ophthalmic solution 1 drop. One drop both eyes twice daily       . fenofibrate micronized (LOFIBRA) 200 MG capsule Take 200 mg by mouth.        . fludrocortisone (FLORINEF) 0.1 MG tablet 1 tablet by mouth once daily  30 tablet  3  . furosemide (LASIX) 20 MG tablet TAKE 1 TABLET EVERY DAY  30 tablet  11  . glucose blood test strip 4 (four) times daily. Use as instructed      . Insulin Regular  Human (HUMULIN R U-500, CONCENTRATED, [employer]) Inject into the skin. Injections are four times a day....... 220 units in the morning, 175 units at lunch, 220 units at dinner, and 175 units at bedtime      . Insulin Syringe-Needle U-100 (B-D INS SYR ULTRAFINE 1CC/30G) 30G X 1/2" 1 ML MISC 4x a day for insulin      . Multiple Vitamin (MULTIVITAMIN) tablet Take 1 tablet by mouth daily.        . OMEGA-3 1000 MG CAPS Take 1 capsule by mouth daily.       Marland Kitchen omeprazole (PRILOSEC) 20 MG capsule Take 20 mg by mouth daily.       . Pancrelipase, Lip-Prot-Amyl, (CREON) 24000 UNITS CPEP Take by mouth. Two capsules by mouth three times a day       . Probiotic Product (PROBIOTIC ACIDOPHILUS) CAPS Take by mouth.        . rosuvastatin (CRESTOR) 20 MG tablet Take 20 mg by mouth. Only take Monday, Wed and Friday       . SYNTHROID 50 MCG tablet TAKE 1 TABLET EVERY DAY  30 tablet  8  . ursodiol (ACTIGALL) 300 MG capsule Take 300 mg by mouth 2 (two) times daily.        Marland Kitchen warfarin (COUMADIN) 2 MG tablet TAKE 1 TABLET (2 MG TOTAL) BY MOUTH AS DIRECTED.  80 tablet  3  . warfarin (COUMADIN) 4 MG tablet Take 4 mg by mouth daily. As directed        Current  Facility-Administered Medications on File Prior to Visit  Medication Dose Route Frequency Provider Last Rate Last Dose  . cyanocobalamin ((VITAMIN B-12)) injection 1,000 mcg  1,000 mcg Intramuscular Q30 days Sheryn Bison, MD   1,000 mcg at 03/13/11 1117    Allergies  Allergen Reactions  . Pioglitazone     ACTOS  REACTION: Edema    Family History  Problem Relation Age of Onset  . Liver cancer Mother     deceased age 50  . Cancer Mother     liver cancer  . Colon cancer Neg Hx   . Heart attack Father     BP 124/60  Pulse 72  Temp(Src) 98.6 F (37 C) (Oral)  Ht 5\' 10"  (1.778 m)  Wt 307 lb 6.4 oz (139.436 kg)  BMI 44.11 kg/m2  SpO2 95%  Review of Systems Denies loc.    Objective:   Physical Exam VITAL SIGNS:  See vs page GENERAL: no distress. Pulses: dorsalis pedis intact bilat.   Feet: no deformity.  no ulcer on the feet.  feet are of normal temp.  There is hyperpigmentation of both legs.  Trace bilat leg edema.  onychomycosis of toenails.  There is a healed surgical scar at the right prox/lat leg. Neuro: sensation is intact to touch on the feet, but decreased from normal.    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

## 2011-08-04 NOTE — Patient Instructions (Addendum)
Please make a follow-up appointment in 3 months.  blood tests are being ordered for you today.  please call [phone] to hear your test results.  You will be prompted to enter the 9-digit "MRN" number that appears at the top left of this page, followed by #.  Then you will hear the message.  pending the test results, please take regular insulin (just before each meal) [phone].

## 2011-08-05 LAB — FRUCTOSAMINE: Fructosamine: 269 umol/L (ref <285)

## 2011-08-08 ENCOUNTER — Ambulatory Visit (INDEPENDENT_AMBULATORY_CARE_PROVIDER_SITE_OTHER): Payer: Medicare Other | Admitting: *Deleted

## 2011-08-08 DIAGNOSIS — I743 Embolism and thrombosis of arteries of the lower extremities: Secondary | ICD-10-CM

## 2011-08-15 ENCOUNTER — Encounter: Payer: Self-pay | Admitting: Pulmonary Disease

## 2011-08-15 ENCOUNTER — Ambulatory Visit (INDEPENDENT_AMBULATORY_CARE_PROVIDER_SITE_OTHER): Payer: Medicare Other | Admitting: Pulmonary Disease

## 2011-08-15 VITALS — BP 124/62 | HR 68 | Temp 97.5°F | Ht 71.0 in | Wt 310.8 lb

## 2011-08-15 DIAGNOSIS — G4733 Obstructive sleep apnea (adult) (pediatric): Secondary | ICD-10-CM

## 2011-08-15 NOTE — Progress Notes (Signed)
  Subjective:    Patient ID: [name], male    DOB: [birth year], 70 y.o.   MRN: [id number]  HPI The patient comes in today for followup of his known obstructive sleep apnea.  At the last visit, he was having issues with pressure tolerance and also mask fitting.  He underwent an auto titration study at home, with optimal pressure being found at 8 cm.  He also had a fitting with the DME company, and is satisfied with his current mask fit.  He is also doing much better since his CPAP machine has been put on a fixed pressure.  The patient states that he is wearing this nightly, and tries to get at least 6 hours per night.  The patient states that he is feeling better, and has improved daytime alertness.   Review of Systems  Constitutional: Negative for fever and unexpected weight change.  HENT: Positive for nosebleeds, congestion, rhinorrhea, sneezing, postnasal drip and sinus pressure. Negative for ear pain, sore throat, trouble swallowing and dental problem.   Eyes: Negative for redness and itching.  Respiratory: Positive for cough and shortness of breath. Negative for chest tightness and wheezing.   Cardiovascular: Negative for palpitations.  Gastrointestinal: Negative for nausea and vomiting.  Genitourinary: Negative for dysuria.  Musculoskeletal: Negative for joint swelling.  Skin: Negative for rash.  Neurological: Negative for headaches.  Hematological: Does not bruise/bleed easily.  Psychiatric/Behavioral: Negative for dysphoric mood. The patient is not nervous/anxious.        Objective:   Physical Exam Obese male in nad No skin breakdown or pressure necrosis from cpap mask LE with 1+ edema, no cyanosis noted. Alert, does not appear sleepy, moves all 4        Assessment & Plan:

## 2011-08-15 NOTE — Assessment & Plan Note (Signed)
The pt is doing much better on fixed pressure mode with a better fitting mask. He is wearing compliantly, and feels his sleep and daytime alertness is better.  I have encouraged him to work on weight loss, and to keep up with mask changes and supplies.

## 2011-08-15 NOTE — Patient Instructions (Signed)
Continue with cpap, and try to get at least 6hrs or more each night with the device. Work on weight loss Keep up with cpap supplies, mask changes/seal changes. followup with me in one year, or sooner if having issues.

## 2011-08-23 ENCOUNTER — Other Ambulatory Visit: Payer: Self-pay | Admitting: Endocrinology

## 2011-08-26 ENCOUNTER — Ambulatory Visit (INDEPENDENT_AMBULATORY_CARE_PROVIDER_SITE_OTHER): Payer: Medicare Other | Admitting: *Deleted

## 2011-08-26 DIAGNOSIS — Z23 Encounter for immunization: Secondary | ICD-10-CM

## 2011-08-30 ENCOUNTER — Other Ambulatory Visit: Payer: Self-pay | Admitting: Endocrinology

## 2011-09-03 LAB — BASIC METABOLIC PANEL WITH GFR
Calcium: 9.5
Creatinine, Ser: 1.75 — ABNORMAL HIGH
GFR calc Af Amer: 48 — ABNORMAL LOW

## 2011-09-03 LAB — LIPID PANEL
Cholesterol: 197
Total CHOL/HDL Ratio: 10.4
VLDL: UNDETERMINED

## 2011-09-03 LAB — CBC
HCT: 40
Hemoglobin: 13.5
MCHC: 33.8
MCV: 88.8
RBC: 4.5
RDW: 18 — ABNORMAL HIGH

## 2011-09-03 LAB — TROPONIN I
Troponin I: 0.05
Troponin I: 0.07 — ABNORMAL HIGH

## 2011-09-03 LAB — BASIC METABOLIC PANEL
BUN: 49 — ABNORMAL HIGH
CO2: 19
Chloride: 104
GFR calc non Af Amer: 39 — ABNORMAL LOW
Glucose, Bld: 198 — ABNORMAL HIGH
Potassium: 5
Sodium: 135

## 2011-09-03 LAB — DIFFERENTIAL
Basophils Absolute: 0
Basophils Relative: 0
Eosinophils Relative: 3
Monocytes Absolute: 0.9
Monocytes Relative: 8
Neutro Abs: 6.6

## 2011-09-03 LAB — POCT CARDIAC MARKERS
CKMB, poc: 4.9
Myoglobin, poc: 196
Operator id: 151321
Troponin i, poc: <0.05

## 2011-09-03 LAB — CK TOTAL AND CKMB (NOT AT ARMC)
CK, MB: 3.1
CK, MB: 3.5
Relative Index: INVALID
Total CK: 59
Total CK: 96

## 2011-09-05 ENCOUNTER — Ambulatory Visit (INDEPENDENT_AMBULATORY_CARE_PROVIDER_SITE_OTHER): Payer: Medicare Other | Admitting: *Deleted

## 2011-09-05 DIAGNOSIS — I743 Embolism and thrombosis of arteries of the lower extremities: Secondary | ICD-10-CM

## 2011-09-05 LAB — HEPARIN LEVEL (UNFRACTIONATED)
Heparin Unfractionated: 0.16 — ABNORMAL LOW
Heparin Unfractionated: 0.38
Heparin Unfractionated: 0.73 — ABNORMAL HIGH

## 2011-09-05 LAB — PROTIME-INR
INR: 1.1
INR: 1.1
INR: 1.8 — ABNORMAL HIGH
INR: 2.1 — ABNORMAL HIGH
Prothrombin Time: 14.1
Prothrombin Time: 14.5
Prothrombin Time: 21.1 — ABNORMAL HIGH

## 2011-09-05 LAB — APTT: aPTT: 29

## 2011-09-05 LAB — CBC
HCT: 32.2 — ABNORMAL LOW
HCT: 33 — ABNORMAL LOW
HCT: 34.5 — ABNORMAL LOW
HCT: 36.3 — ABNORMAL LOW
Hemoglobin: 10.6 — ABNORMAL LOW
Hemoglobin: 11.2 — ABNORMAL LOW
Hemoglobin: 12.2 — ABNORMAL LOW
Hemoglobin: 12.2 — ABNORMAL LOW
MCHC: 33.1
MCHC: 33.4
MCHC: 33.4
MCHC: 34
MCV: 90.7
MCV: 90.9
MCV: 91.9
MCV: 92.3
Platelets: 300
Platelets: 333
Platelets: 363
RBC: 3.57 — ABNORMAL LOW
RBC: 4.02 — ABNORMAL LOW
RDW: 16.2 — ABNORMAL HIGH
RDW: 16.4 — ABNORMAL HIGH
RDW: 16.9 — ABNORMAL HIGH
RDW: 17 — ABNORMAL HIGH
WBC: 11.4 — ABNORMAL HIGH
WBC: 12.8 — ABNORMAL HIGH
WBC: 12.9 — ABNORMAL HIGH
WBC: 13.6 — ABNORMAL HIGH
WBC: 9.9

## 2011-09-05 LAB — BASIC METABOLIC PANEL
BUN: 20
BUN: 42 — ABNORMAL HIGH
CO2: 19
CO2: 20
CO2: 33 — ABNORMAL HIGH
Calcium: 7.8 — ABNORMAL LOW
Calcium: 8 — ABNORMAL LOW
Calcium: 8.4
Calcium: 8.4
Calcium: 9.8
Chloride: 93 — ABNORMAL LOW
Creatinine, Ser: 1.56 — ABNORMAL HIGH
Creatinine, Ser: 1.73 — ABNORMAL HIGH
Creatinine, Ser: 2.15 — ABNORMAL HIGH
GFR calc Af Amer: 40 — ABNORMAL LOW
GFR calc Af Amer: 48 — ABNORMAL LOW
GFR calc Af Amer: 54 — ABNORMAL LOW
GFR calc non Af Amer: 31 — ABNORMAL LOW
GFR calc non Af Amer: 36 — ABNORMAL LOW
GFR calc non Af Amer: 45 — ABNORMAL LOW
Glucose, Bld: 148 — ABNORMAL HIGH
Glucose, Bld: 265 — ABNORMAL HIGH
Potassium: 3.8
Potassium: 5.1
Sodium: 133 — ABNORMAL LOW
Sodium: 135

## 2011-09-05 LAB — URINALYSIS, ROUTINE W REFLEX MICROSCOPIC
Bilirubin Urine: NEGATIVE
Hgb urine dipstick: NEGATIVE
Ketones, ur: NEGATIVE
Nitrite: NEGATIVE
Nitrite: NEGATIVE
Protein, ur: NEGATIVE
Specific Gravity, Urine: 1.015
Urobilinogen, UA: 0.2
Urobilinogen, UA: 0.2

## 2011-09-05 LAB — DIFFERENTIAL
Basophils Absolute: 0.1
Eosinophils Absolute: 0.1
Eosinophils Relative: 1

## 2011-09-05 LAB — ANTIPHOSPHOLIPID SYNDROME EVAL, BLD
Anticardiolipin IgA: <7 — ABNORMAL LOW (ref <13)
Antiphosphatidylserine IgA: <20 APS U/mL (ref <20.0)
PTT Lupus Anticoagulant: 137.5 — ABNORMAL HIGH (ref 36.3–48.8)
PTTLA 4:1 Mix: 95.2 — ABNORMAL HIGH (ref 36.3–48.8)
PTTLA Confirmation: 11.2 — ABNORMAL HIGH (ref <8.0)
dRVVT Incubated 1:1 Mix: 39.1 (ref 36.1–47.0)

## 2011-09-05 LAB — CULTURE, BLOOD (ROUTINE X 2)

## 2011-09-05 LAB — ANA: Anti Nuclear Antibody(ANA): NEGATIVE

## 2011-09-05 LAB — POTASSIUM
Potassium: 5.5 — ABNORMAL HIGH
Potassium: 6.2 — ABNORMAL HIGH
Potassium: 7

## 2011-09-05 LAB — URINE MICROSCOPIC-ADD ON

## 2011-09-05 LAB — PROTEIN ELECTROPHORESIS, SERUM
Albumin ELP: 46.6 — ABNORMAL LOW
Alpha-1-Globulin: 5.6 — ABNORMAL HIGH
Total Protein ELP: 6.8

## 2011-09-05 LAB — ANTI-NEUTROPHIL ANTIBODY

## 2011-09-05 LAB — URINE CULTURE
Colony Count: NO GROWTH
Culture: NO GROWTH

## 2011-09-05 LAB — POCT INR: INR: 2.1

## 2011-09-12 ENCOUNTER — Telehealth: Payer: Self-pay | Admitting: *Deleted

## 2011-09-12 NOTE — Telephone Encounter (Signed)
Immunization report from [facility]  Vaccine Administered: Fluvirin Multidose Vial Dose: 0.5 mL Injection Route/Site: IM/Left arm Manufacturer: [employer] Date Administered: 09/08/2011 Lot #: [id number] Exp Date: 05/27/2012

## 2011-09-18 LAB — BASIC METABOLIC PANEL
Calcium: 9.9
GFR calc Af Amer: >60
GFR calc non Af Amer: 50 — ABNORMAL LOW
Glucose, Bld: 187 — ABNORMAL HIGH
Sodium: 139

## 2011-09-18 LAB — URINE CULTURE

## 2011-09-18 LAB — URINALYSIS, ROUTINE W REFLEX MICROSCOPIC
Nitrite: POSITIVE — AB
Specific Gravity, Urine: 1.022
Urobilinogen, UA: 1

## 2011-09-18 LAB — DIFFERENTIAL
Basophils Absolute: 0
Lymphocytes Relative: 17
Monocytes Absolute: 1 — ABNORMAL HIGH
Neutro Abs: 12.7 — ABNORMAL HIGH

## 2011-09-18 LAB — CBC
Hemoglobin: 14.2
RDW: 13.9

## 2011-09-18 LAB — URINE MICROSCOPIC-ADD ON

## 2011-09-26 ENCOUNTER — Other Ambulatory Visit: Payer: Self-pay | Admitting: Pulmonary Disease

## 2011-10-01 ENCOUNTER — Other Ambulatory Visit: Payer: Self-pay | Admitting: Endocrinology

## 2011-10-03 ENCOUNTER — Ambulatory Visit (INDEPENDENT_AMBULATORY_CARE_PROVIDER_SITE_OTHER): Payer: Medicare Other | Admitting: *Deleted

## 2011-10-03 DIAGNOSIS — I743 Embolism and thrombosis of arteries of the lower extremities: Secondary | ICD-10-CM

## 2011-10-03 LAB — POCT INR: INR: 2.5

## 2011-10-31 ENCOUNTER — Other Ambulatory Visit: Payer: Self-pay | Admitting: Endocrinology

## 2011-11-03 ENCOUNTER — Ambulatory Visit (INDEPENDENT_AMBULATORY_CARE_PROVIDER_SITE_OTHER): Payer: Medicare Other | Admitting: Endocrinology

## 2011-11-03 ENCOUNTER — Encounter: Payer: Self-pay | Admitting: Endocrinology

## 2011-11-03 ENCOUNTER — Other Ambulatory Visit (INDEPENDENT_AMBULATORY_CARE_PROVIDER_SITE_OTHER): Payer: Medicare Other

## 2011-11-03 VITALS — BP 138/62 | HR 77 | Temp 98.5°F | Ht 70.0 in | Wt 311.0 lb

## 2011-11-03 DIAGNOSIS — E875 Hyperkalemia: Secondary | ICD-10-CM

## 2011-11-03 DIAGNOSIS — E109 Type 1 diabetes mellitus without complications: Secondary | ICD-10-CM

## 2011-11-03 LAB — BASIC METABOLIC PANEL
CO2: 24 mEq/L (ref 19–32)
Chloride: 105 mEq/L (ref 96–112)
Creatinine, Ser: 1.6 mg/dL — ABNORMAL HIGH (ref 0.4–1.5)
Potassium: 5.1 mEq/L (ref 3.5–5.1)
Sodium: 140 mEq/L (ref 135–145)

## 2011-11-03 LAB — HEMOGLOBIN A1C: Hgb A1c MFr Bld: 9.1 % — ABNORMAL HIGH (ref 4.6–6.5)

## 2011-11-03 NOTE — Progress Notes (Signed)
Subjective:    Patient ID: [name], male    DOB: 12-May-1941, 70 y.o.   MRN: [id number]  HPI Pt return for f/u of insulin-requiring DM (1988).  It is characterized by severe insulin resistance.  he brings a record of his cbg's which i have reviewed today.  It varies from 83-162.  There is no trend throughout the day. Past Medical History  Diagnosis Date  . Diabetes mellitus   . Hypertension   . COPD (chronic obstructive pulmonary disease)     CPAP  . Renal insufficiency   . Hyperlipemia   . Anemia   . Other and unspecified coagulation defects   . Hypothyroidism   . CAD (coronary artery disease)   . Hyperkalemia   . Osteoarthritis   . Glaucoma   . Sick sinus syndrome     s/p PPM by JA  . Pancreatitis   . Obesity   . Constipation   . OSA on CPAP     using CPAP although it is hard for him to tolerate  . SOB (shortness of breath)     Past Surgical History  Procedure Date  . Nasal septum surgery 1967  . Pacemaker placement 06/2010    [facility] Accent RF DR, Model [phone] ( Serial number [id number])  . Thromboembolectomy and four compartment fasciotomy 2009  . Cholecystectomy     History   Social History  . Marital Status: Married    Spouse Name: N/A    Number of Children: N/A  . 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 Smoking status: Former Smoker -- 2.0 packs/day for 50 years    Types: Cigarettes    Quit date: 12/08/2006  . Smokeless tobacco: Never Used  . Alcohol Use: No  . Drug Use: No  . Sexually Active: Not on file   Other Topics Concern  . Not on file   Social History Narrative  . No narrative on file    Current Outpatient Prescriptions on File Prior to Visit  Medication Sig Dispense Refill  . Ascorbic Acid (VITAMIN C) 500 MG tablet Take 500 mg by mouth daily.        Marland Kitchen aspirin 81 MG tablet Take 81 mg by mouth daily.        . Bimatoprost (LUMIGAN) 0.01 % SOLN Apply to  eye. Once daily-one drop both eyes       . brimonidine (ALPHAGAN P) 0.1 % SOLN One drop both eyes twice daily       . Cholecalciferol (VITAMIN D3) 400 UNITS CAPS Take 1,000 Units by mouth daily.       . CRESTOR 40 MG tablet TAKE 1/2 TABLET BY MOUTH 3 TIMES WEEKLY(MONDAY,WEDNESDAY,FRIDAY)  30 tablet  3  . dextromethorphan-guaiFENesin (MUCINEX DM) 30-600 MG per 12 hr tablet Take 1 tablet by mouth as needed.       . dorzolamide (TRUSOPT) 2 % ophthalmic solution 1 drop. One drop both eyes twice daily       . fenofibrate micronized (LOFIBRA) 200 MG capsule TAKE 1 CAPSULE EVERY DAY  30 capsule  5  . fludrocortisone (FLORINEF) 0.1 MG tablet TAKE 1 TABLET BY MOUTH DAILY  30 tablet  3  . furosemide (LASIX) 20 MG tablet TAKE 1 TABLET EVERY DAY  30 tablet  11  . glucose blood test strip 4 (four) times daily. Use as instructed      .  Insulin Regular Human (HUMULIN R U-500, CONCENTRATED, [place]) Inject into the skin. Injections are four times a day....... 220 units in the morning, 175 units at lunch, 220 units at dinner, and 175 units at bedtime      . Insulin Syringe-Needle U-100 (B-D INS SYR ULTRAFINE 1CC/30G) 30G X 1/2" 1 ML MISC 4x a day for insulin      . Multiple Vitamin (MULTIVITAMIN) tablet Take 1 tablet by mouth daily.        . OMEGA-3 1000 MG CAPS Take 1 capsule by mouth daily.       Marland Kitchen omeprazole (PRILOSEC) 20 MG capsule TAKE AS DIRECTED  30 capsule  5  . Pancrelipase, Lip-Prot-Amyl, (CREON) 24000 UNITS CPEP Take by mouth. Two capsules by mouth three times a day       . Probiotic Product (PROBIOTIC ACIDOPHILUS) CAPS Take by mouth.        . rosuvastatin (CRESTOR) 20 MG tablet Take 20 mg by mouth. Only take Monday, Wed and Friday       . SYNTHROID 50 MCG tablet TAKE 1 TABLET EVERY DAY  30 tablet  8  . warfarin (COUMADIN) 2 MG tablet TAKE 1 TABLET (2 MG TOTAL) BY MOUTH AS DIRECTED.  80 tablet  3  . warfarin (COUMADIN) 4 MG tablet Take 4 mg by mouth daily. As directed        Current Facility-Administered  Medications on File Prior to Visit  Medication Dose Route Frequency Provider Last Rate Last Dose  . cyanocobalamin ((VITAMIN B-12)) injection 1,000 mcg  1,000 mcg Intramuscular Q30 days Sheryn Bison, MD   1,000 mcg at 03/13/11 1117    Allergies  Allergen Reactions  . Pioglitazone     ACTOS  REACTION: Edema    Family History  Problem Relation Age of Onset  . Liver cancer Mother     deceased age 3  . Cancer Mother     liver cancer  . Colon cancer Neg Hx   . Heart attack Father     BP 138/62  Pulse 77  Temp(Src) 98.5 F (36.9 C) (Oral)  Ht 5\' 10"  (1.778 m)  Wt 311 lb (141.069 kg)  BMI 44.62 kg/m2  SpO2 95%  Review of Systems denies hypoglycemia    Objective:   Physical Exam VITAL SIGNS:  See vs page GENERAL: no distress.  Obese SKIN:  Insulin injection sites at the anterior abdomen are normal   Lab Results  Component Value Date   HGBA1C 9.1* 11/03/2011      Assessment & Plan:  DM.  a1c is high, but fructosamine has suggested better control

## 2011-11-03 NOTE — Patient Instructions (Addendum)
Please make a follow-up appointment in 3 months.  blood tests are being ordered for you today.  please call [phone] to hear your test results.  You will be prompted to enter the 9-digit "MRN" number that appears at the top left of this page, followed by #.  Then you will hear the message.  pending the test results, please continue regular insulin 3x a day (just before each meal) [phone].  (update: i left message on phone-tree:  rx as we discussed)

## 2011-11-13 ENCOUNTER — Other Ambulatory Visit: Payer: Self-pay | Admitting: Endocrinology

## 2011-11-14 ENCOUNTER — Encounter: Payer: Medicare Other | Admitting: *Deleted

## 2011-11-21 ENCOUNTER — Encounter: Payer: Medicare Other | Admitting: *Deleted

## 2011-11-21 ENCOUNTER — Other Ambulatory Visit: Payer: Self-pay | Admitting: Gastroenterology

## 2011-11-24 ENCOUNTER — Ambulatory Visit (INDEPENDENT_AMBULATORY_CARE_PROVIDER_SITE_OTHER): Payer: Medicare Other | Admitting: *Deleted

## 2011-11-24 DIAGNOSIS — I743 Embolism and thrombosis of arteries of the lower extremities: Secondary | ICD-10-CM

## 2011-11-24 LAB — POCT INR: INR: 2.2

## 2011-12-01 ENCOUNTER — Other Ambulatory Visit: Payer: Self-pay | Admitting: Internal Medicine

## 2011-12-29 ENCOUNTER — Other Ambulatory Visit: Payer: Self-pay | Admitting: Pulmonary Disease

## 2011-12-31 ENCOUNTER — Encounter: Payer: Self-pay | Admitting: *Deleted

## 2012-01-07 ENCOUNTER — Ambulatory Visit (INDEPENDENT_AMBULATORY_CARE_PROVIDER_SITE_OTHER): Payer: Medicare Other | Admitting: *Deleted

## 2012-01-07 DIAGNOSIS — I743 Embolism and thrombosis of arteries of the lower extremities: Secondary | ICD-10-CM

## 2012-01-07 LAB — POCT INR: INR: 1.7

## 2012-01-14 ENCOUNTER — Ambulatory Visit (INDEPENDENT_AMBULATORY_CARE_PROVIDER_SITE_OTHER): Payer: Medicare Other | Admitting: *Deleted

## 2012-01-14 ENCOUNTER — Encounter: Payer: Self-pay | Admitting: Internal Medicine

## 2012-01-14 DIAGNOSIS — I498 Other specified cardiac arrhythmias: Secondary | ICD-10-CM

## 2012-01-14 LAB — PACEMAKER DEVICE OBSERVATION
AL AMPLITUDE: 1.5 mv
AL IMPEDENCE PM: 400 Ohm
AL THRESHOLD: 0.625 V
ATRIAL PACING PM: 92
DEVICE MODEL PM: 7152830
RV LEAD AMPLITUDE: 7.3 mv
RV LEAD IMPEDENCE PM: 475 Ohm
RV LEAD THRESHOLD: 1 V

## 2012-01-20 ENCOUNTER — Other Ambulatory Visit: Payer: Self-pay | Admitting: *Deleted

## 2012-02-03 ENCOUNTER — Ambulatory Visit (INDEPENDENT_AMBULATORY_CARE_PROVIDER_SITE_OTHER): Payer: Medicare Other | Admitting: Endocrinology

## 2012-02-03 ENCOUNTER — Other Ambulatory Visit (INDEPENDENT_AMBULATORY_CARE_PROVIDER_SITE_OTHER): Payer: Medicare Other

## 2012-02-03 ENCOUNTER — Encounter: Payer: Self-pay | Admitting: Endocrinology

## 2012-02-03 VITALS — BP 128/62 | HR 75 | Temp 98.0°F | Ht 70.0 in | Wt 310.2 lb

## 2012-02-03 DIAGNOSIS — E1029 Type 1 diabetes mellitus with other diabetic kidney complication: Secondary | ICD-10-CM

## 2012-02-03 DIAGNOSIS — N058 Unspecified nephritic syndrome with other morphologic changes: Secondary | ICD-10-CM

## 2012-02-03 DIAGNOSIS — E1065 Type 1 diabetes mellitus with hyperglycemia: Secondary | ICD-10-CM

## 2012-02-03 MED ORDER — INSULIN REGULAR HUMAN (CONC) 500 UNIT/ML ~~LOC~~ SOLN: SUBCUTANEOUS | Status: DC

## 2012-02-03 NOTE — Progress Notes (Signed)
Subjective:    Patient ID: [name], male    DOB: Aug 18, 1941, 71 y.o.   MRN: [id number]  HPI Pt return for f/u of insulin-requiring DM (1988).  It is characterized by severe insulin resistance.  he brings a record of his cbg's which i have reviewed today.  It varies from 98-172.  It is in general higher as the day goes on, but not necessarily so.   Past Medical History  Diagnosis Date  . Diabetes mellitus   . Hypertension   . COPD (chronic obstructive pulmonary disease)     CPAP  . Renal insufficiency   . Hyperlipemia   . Anemia   . Other and unspecified coagulation defects   . Hypothyroidism   . CAD (coronary artery disease)   . Hyperkalemia   . Osteoarthritis   . Glaucoma   . Sick sinus syndrome     s/p PPM by JA  . Pancreatitis   . Obesity   . Constipation   . OSA on CPAP     using CPAP although it is hard for him to tolerate  . SOB (shortness of breath)     Past Surgical History  Procedure Date  . Nasal septum surgery 1967  . Pacemaker placement 06/2010    [facility] Accent RF DR, Model [id number] ( Serial number [id number])  . Thromboembolectomy and four compartment fasciotomy 2009  . Cholecystectomy     History   Social History  . Marital Status: Married    Spouse Name: N/A    Number of Children: N/A  . 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 Smoking status: Former Smoker -- 2.0 packs/day for 50 years    Types: Cigarettes    Quit date: 12/08/2006  . Smokeless tobacco: Never Used  . Alcohol Use: No  . Drug Use: No  . Sexually Active: Not on file   Other Topics Concern  . Not on file   Social History Narrative  . No narrative on file    Current Outpatient Prescriptions on File Prior to Visit  Medication Sig Dispense Refill  . Ascorbic Acid (VITAMIN C) 500 MG tablet Take 500 mg by mouth daily.        Marland Kitchen aspirin 81 MG tablet Take 81 mg by mouth daily.        . B-D INS SYR  ULTRAFINE 1CC/30G 30G X 1/2" 1 ML MISC USE FOR INSULIN TO TEST 4 TIMES DAILY  400 each  3  . Bimatoprost (LUMIGAN) 0.01 % SOLN Apply to eye. Once daily-one drop both eyes       . brimonidine (ALPHAGAN P) 0.1 % SOLN One drop both eyes twice daily       . Cholecalciferol (VITAMIN D3) 400 UNITS CAPS Take 1,000 Units by mouth daily.       . CRESTOR 40 MG tablet TAKE 1/2 TABLET BY MOUTH 3 TIMES WEEKLY(MONDAY,WEDNESDAY,FRIDAY)  30 tablet  3  . dextromethorphan-guaiFENesin (MUCINEX DM) 30-600 MG per 12 hr tablet Take 1 tablet by mouth as needed.       . dorzolamide (TRUSOPT) 2 % ophthalmic solution 1 drop. One drop both eyes twice daily       . fenofibrate micronized (LOFIBRA) 200 MG capsule TAKE 1 CAPSULE EVERY DAY  30 capsule  5  . fludrocortisone (FLORINEF) 0.1 MG tablet 3 (three) times a week.       Marland Kitchen  fluticasone (VERAMYST) 27.5 MCG/SPRAY nasal spray Place 2 sprays into the nose daily as needed.      . furosemide (LASIX) 20 MG tablet TAKE 1 TABLET EVERY DAY  30 tablet  11  . glucose blood test strip 4 (four) times daily. Use as instructed      . mometasone (NASONEX) 50 MCG/ACT nasal spray        . Multiple Vitamin (MULTIVITAMIN) tablet Take 1 tablet by mouth daily.        . OMEGA-3 1000 MG CAPS Take 1 capsule by mouth daily.       Marland Kitchen omeprazole (PRILOSEC) 20 MG capsule TAKE AS DIRECTED  30 capsule  5  . PROAIR HFA 108 (90 BASE) MCG/ACT inhaler INHALE 2 PUFFS EVERY 4 TO 6 HOURS AS NEEDED  8.5 g  8  . Probiotic Product (PROBIOTIC ACIDOPHILUS) CAPS Take by mouth.        . SYNTHROID 50 MCG tablet TAKE 1 TABLET EVERY DAY  30 tablet  8  . warfarin (COUMADIN) 4 MG tablet Take 4 mg by mouth daily. As directed      . ZENPEP 25000 UNITS CPEP TAKE 2 CAPSULES BY MOUTH 3 TIMES A DAY WITH MEALS  180 capsule  4   Current Facility-Administered Medications on File Prior to Visit  Medication Dose Route Frequency Provider Last Rate Last Dose  . cyanocobalamin ((VITAMIN B-12)) injection 1,000 mcg  1,000 mcg  Intramuscular Q30 days Sheryn Bison, MD   1,000 mcg at 03/13/11 1117    Allergies  Allergen Reactions  . Pioglitazone     ACTOS  REACTION: Edema    Family History  Problem Relation Age of Onset  . Liver cancer Mother     deceased age 20  . Cancer Mother     liver cancer  . Colon cancer Neg Hx   . Heart attack Father     BP 128/62  Pulse 75  Temp(Src) 98 F (36.7 C) (Oral)  Ht 5\' 10"  (1.778 m)  Wt 310 lb 3.2 oz (140.706 kg)  BMI 44.51 kg/m2  SpO2 96%   Review of Systems denies hypoglycemia.      Objective:   Physical Exam VITAL SIGNS:  See vs page GENERAL: no distress Pulses: dorsalis pedis intact bilat.  Feet: no deformity. no ulcer on the feet. feet are of normal temp. There is hyperpigmentation of both legs. Trace bilat leg edema.  Severe onychomycosis of toenails. There are 2 healed surgical scars at the right leg (dvt).  Neuro: sensation is intact to touch on the feet, but decreased from normal.     Lab Results  Component Value Date   HGBA1C 9.9* 02/03/2012      Assessment & Plan:  DM, needs increased rx

## 2012-02-03 NOTE — Patient Instructions (Addendum)
Please schedule a regular physical for after 05/05/12.   blood tests are being ordered for you today.  please call [phone] to hear your test results.  You will be prompted to enter the 9-digit "MRN" number that appears at the top left of this page, followed by #.  Then you will hear the message.  pending the test results, please increase regular insulin 3x a day (just before each meal) [phone].   (update: i left message on phone-tree:  rx as we discussed)

## 2012-02-04 ENCOUNTER — Ambulatory Visit (INDEPENDENT_AMBULATORY_CARE_PROVIDER_SITE_OTHER): Payer: Medicare Other | Admitting: *Deleted

## 2012-02-04 DIAGNOSIS — I743 Embolism and thrombosis of arteries of the lower extremities: Secondary | ICD-10-CM

## 2012-03-03 ENCOUNTER — Ambulatory Visit (INDEPENDENT_AMBULATORY_CARE_PROVIDER_SITE_OTHER): Payer: Medicare Other | Admitting: *Deleted

## 2012-03-03 DIAGNOSIS — I743 Embolism and thrombosis of arteries of the lower extremities: Secondary | ICD-10-CM

## 2012-03-31 ENCOUNTER — Ambulatory Visit (INDEPENDENT_AMBULATORY_CARE_PROVIDER_SITE_OTHER): Payer: Medicare Other | Admitting: *Deleted

## 2012-03-31 DIAGNOSIS — I743 Embolism and thrombosis of arteries of the lower extremities: Secondary | ICD-10-CM

## 2012-03-31 LAB — POCT INR: INR: 2.1

## 2012-04-12 ENCOUNTER — Other Ambulatory Visit: Payer: Self-pay | Admitting: Endocrinology

## 2012-04-13 ENCOUNTER — Other Ambulatory Visit: Payer: Self-pay

## 2012-04-13 ENCOUNTER — Other Ambulatory Visit (HOSPITAL_COMMUNITY): Payer: Self-pay

## 2012-04-13 MED ORDER — WARFARIN SODIUM 2 MG PO TABS: ORAL_TABLET | ORAL | Status: DC

## 2012-04-30 ENCOUNTER — Ambulatory Visit (INDEPENDENT_AMBULATORY_CARE_PROVIDER_SITE_OTHER): Payer: Medicare Other

## 2012-04-30 DIAGNOSIS — I743 Embolism and thrombosis of arteries of the lower extremities: Secondary | ICD-10-CM

## 2012-04-30 LAB — POCT INR: INR: 1.4

## 2012-05-12 ENCOUNTER — Other Ambulatory Visit: Payer: Self-pay | Admitting: Endocrinology

## 2012-05-12 ENCOUNTER — Telehealth: Payer: Self-pay | Admitting: *Deleted

## 2012-05-12 ENCOUNTER — Encounter: Payer: Self-pay | Admitting: Endocrinology

## 2012-05-12 ENCOUNTER — Ambulatory Visit (INDEPENDENT_AMBULATORY_CARE_PROVIDER_SITE_OTHER): Payer: Medicare Other | Admitting: Endocrinology

## 2012-05-12 ENCOUNTER — Other Ambulatory Visit (INDEPENDENT_AMBULATORY_CARE_PROVIDER_SITE_OTHER): Payer: Medicare Other

## 2012-05-12 ENCOUNTER — Ambulatory Visit: Payer: Medicare Other

## 2012-05-12 VITALS — BP 128/70 | HR 67 | Temp 98.8°F | Ht 70.0 in | Wt 312.0 lb

## 2012-05-12 DIAGNOSIS — Z125 Encounter for screening for malignant neoplasm of prostate: Secondary | ICD-10-CM

## 2012-05-12 DIAGNOSIS — R0602 Shortness of breath: Secondary | ICD-10-CM

## 2012-05-12 DIAGNOSIS — R8281 Pyuria: Secondary | ICD-10-CM

## 2012-05-12 DIAGNOSIS — R1013 Epigastric pain: Secondary | ICD-10-CM

## 2012-05-12 DIAGNOSIS — N529 Male erectile dysfunction, unspecified: Secondary | ICD-10-CM

## 2012-05-12 DIAGNOSIS — N058 Unspecified nephritic syndrome with other morphologic changes: Secondary | ICD-10-CM

## 2012-05-12 DIAGNOSIS — I10 Essential (primary) hypertension: Secondary | ICD-10-CM

## 2012-05-12 DIAGNOSIS — R82998 Other abnormal findings in urine: Secondary | ICD-10-CM

## 2012-05-12 DIAGNOSIS — D72829 Elevated white blood cell count, unspecified: Secondary | ICD-10-CM

## 2012-05-12 DIAGNOSIS — N259 Disorder resulting from impaired renal tubular function, unspecified: Secondary | ICD-10-CM

## 2012-05-12 DIAGNOSIS — I5032 Chronic diastolic (congestive) heart failure: Secondary | ICD-10-CM

## 2012-05-12 DIAGNOSIS — D509 Iron deficiency anemia, unspecified: Secondary | ICD-10-CM

## 2012-05-12 DIAGNOSIS — K7689 Other specified diseases of liver: Secondary | ICD-10-CM

## 2012-05-12 DIAGNOSIS — E291 Testicular hypofunction: Secondary | ICD-10-CM

## 2012-05-12 DIAGNOSIS — E1029 Type 1 diabetes mellitus with other diabetic kidney complication: Secondary | ICD-10-CM

## 2012-05-12 DIAGNOSIS — E1065 Type 1 diabetes mellitus with hyperglycemia: Secondary | ICD-10-CM

## 2012-05-12 LAB — BASIC METABOLIC PANEL
BUN: 19 mg/dL (ref 6–23)
GFR: 57.65 mL/min — ABNORMAL LOW (ref >60.00)
Potassium: 4.2 mEq/L (ref 3.5–5.1)
Sodium: 137 mEq/L (ref 135–145)

## 2012-05-12 LAB — URINALYSIS, ROUTINE W REFLEX MICROSCOPIC
Nitrite: POSITIVE
Specific Gravity, Urine: 1.02 (ref 1.000–1.030)
pH: 6 (ref 5.0–8.0)

## 2012-05-12 LAB — TESTOSTERONE: Testosterone: 171.76 ng/dL — ABNORMAL LOW (ref 350.00–890.00)

## 2012-05-12 LAB — MICROALBUMIN / CREATININE URINE RATIO
Creatinine,U: 68.1 mg/dL
Microalb Creat Ratio: 81 mg/g — ABNORMAL HIGH (ref 0.0–30.0)
Microalb, Ur: 55.1 mg/dL — ABNORMAL HIGH (ref 0.0–1.9)

## 2012-05-12 LAB — HEPATIC FUNCTION PANEL
ALT: 81 U/L — ABNORMAL HIGH (ref 0–53)
AST: 88 U/L — ABNORMAL HIGH (ref 0–37)
Bilirubin, Direct: 0.2 mg/dL (ref 0.0–0.3)
Total Bilirubin: 0.7 mg/dL (ref 0.3–1.2)
Total Protein: 7.9 g/dL (ref 6.0–8.3)

## 2012-05-12 LAB — HEMOGLOBIN A1C: Hgb A1c MFr Bld: 8.6 % — ABNORMAL HIGH (ref 4.6–6.5)

## 2012-05-12 LAB — IBC PANEL: Saturation Ratios: 19.2 % — ABNORMAL LOW (ref 20.0–50.0)

## 2012-05-12 LAB — URIC ACID: Uric Acid, Serum: 4.9 mg/dL (ref 4.0–7.8)

## 2012-05-12 LAB — TSH: TSH: 5.53 u[IU]/mL — ABNORMAL HIGH (ref 0.35–5.50)

## 2012-05-12 MED ORDER — GLUCOSE BLOOD VI STRP: ORAL_STRIP | Status: DC

## 2012-05-12 NOTE — Progress Notes (Signed)
Subjective:    Patient ID: [name], male    DOB: June 03, 1941, 71 y.o.   MRN: [id number]  HPI here for regular wellness examination.  He's feeling pretty well in general, and says chronic med probs are stable, except as noted below Past Medical History  Diagnosis Date  . Diabetes mellitus   . Hypertension   . COPD (chronic obstructive pulmonary disease)     CPAP  . Renal insufficiency   . Hyperlipemia   . Anemia   . Other and unspecified coagulation defects   . Hypothyroidism   . CAD (coronary artery disease)   . Hyperkalemia   . Osteoarthritis   . Glaucoma   . Sick sinus syndrome     s/p PPM by JA  . Pancreatitis   . Obesity   . Constipation   . OSA on CPAP     using CPAP although it is hard for him to tolerate  . SOB (shortness of breath)     Past Surgical History  Procedure Date  . Nasal septum surgery 1967  . Pacemaker placement 06/2010    [facility] [employer] RF DR, Model [phone] ( Serial number [id number])  . Thromboembolectomy and four compartment fasciotomy 2009  . Cholecystectomy     History   Social History  . Marital Status: Married    Spouse Name: N/A    Number of Children: N/A  . 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 Smoking status: Former Smoker -- 2.0 packs/day for 50 years    Types: Cigarettes    Quit date: 12/08/2006  . Smokeless tobacco: Never Used  . Alcohol Use: No  . Drug Use: No  . Sexually Active: Not on file   Other Topics Concern  . Not on file   Social History Narrative  . No narrative on file    Current Outpatient Prescriptions on File Prior to Visit  Medication Sig Dispense Refill  . Ascorbic Acid (VITAMIN C) 500 MG tablet Take 500 mg by mouth daily.        Marland Kitchen aspirin 81 MG tablet Take 81 mg by mouth daily.        . B-D INS SYR ULTRAFINE 1CC/30G 30G X 1/2" 1 ML MISC USE FOR INSULIN TO TEST 4 TIMES DAILY  400 each  3  . Bimatoprost (LUMIGAN)  0.01 % SOLN Apply to eye. Once daily-one drop both eyes       . brimonidine (ALPHAGAN P) 0.1 % SOLN One drop both eyes twice daily       . Cholecalciferol (VITAMIN D3) 400 UNITS CAPS Take 1,000 Units by mouth daily.       . CRESTOR 40 MG tablet TAKE 1/2 TABLET BY MOUTH 3 TIMES WEEKLY(MONDAY,WEDNESDAY,FRIDAY)  30 tablet  3  . dextromethorphan-guaiFENesin (MUCINEX DM) 30-600 MG per 12 hr tablet Take 1 tablet by mouth as needed.       . dorzolamide (TRUSOPT) 2 % ophthalmic solution 1 drop. One drop both eyes twice daily       . fenofibrate micronized (LOFIBRA) 200 MG capsule TAKE 1 CAPSULE EVERY DAY  30 capsule  5  . fludrocortisone (FLORINEF) 0.1 MG tablet 3 (three) times a week.       . fluticasone (VERAMYST) 27.5 MCG/SPRAY nasal spray Place 2 sprays into the nose daily as needed.      . furosemide (LASIX) 20 MG tablet  TAKE 1 TABLET EVERY DAY  30 tablet  11  . insulin regular human CONCENTRATED (HUMULIN R) 500 UNIT/ML SOLN injection 3x a day (just before each meal) 250-200-250-175 units  7 vial  11  . mometasone (NASONEX) 50 MCG/ACT nasal spray        . Multiple Vitamin (MULTIVITAMIN) tablet Take 1 tablet by mouth daily.        . OMEGA-3 1000 MG CAPS Take 1 capsule by mouth daily.       Marland Kitchen omeprazole (PRILOSEC) 20 MG capsule TAKE AS DIRECTED  30 capsule  5  . PROAIR HFA 108 (90 BASE) MCG/ACT inhaler INHALE 2 PUFFS EVERY 4 TO 6 HOURS AS NEEDED  8.5 g  8  . Probiotic Product (PROBIOTIC ACIDOPHILUS) CAPS Take by mouth.        . SYNTHROID 50 MCG tablet TAKE 1 TABLET EVERY DAY  30 tablet  8  . warfarin (COUMADIN) 2 MG tablet Take as directed by anticoagulation clinic  80 tablet  3  . warfarin (COUMADIN) 4 MG tablet Take 4 mg by mouth daily. As directed      . ZENPEP 25000 UNITS CPEP TAKE 2 CAPSULES BY MOUTH 3 TIMES A DAY WITH MEALS  180 capsule  4  . DISCONTD: NASONEX 50 MCG/ACT nasal spray INHALE 2 PUFFS IN EACH NOSTRIL DAILY  17 g  5    Allergies  Allergen Reactions  . Pioglitazone     ACTOS   REACTION: Edema    Family History  Problem Relation Age of Onset  . Liver cancer Mother     deceased age 73  . Cancer Mother     liver cancer  . Colon cancer Neg Hx   . Heart attack Father     BP 128/70  Pulse 67  Temp(Src) 98.8 F (37.1 C) (Oral)  Ht 5\' 10"  (1.778 m)  Wt 312 lb (141.522 kg)  BMI 44.77 kg/m2  SpO2 97%    Review of Systems  Constitutional: Negative for fever.  HENT: Positive for hearing loss.   Eyes: Negative for visual disturbance.  Respiratory: Negative for shortness of breath.   Cardiovascular: Negative for chest pain.  Gastrointestinal: Negative for anal bleeding.  Genitourinary: Negative for hematuria.  Musculoskeletal: Negative for gait problem.  Skin: Negative for rash.  Neurological: Negative for headaches.  Hematological: Does not bruise/bleed easily.  Psychiatric/Behavioral: Positive for dysphoric mood.      Objective:   Physical Exam VS: see vs page GEN: no distress.  obese HEAD: head: no deformity eyes: no periorbital swelling, no proptosis external nose and ears are normal mouth: no lesion seen NECK: supple, thyroid is not enlarged CHEST WALL: no deformity LUNGS: clear to auscultation BREASTS:  No gynecomastia CV: reg rate and rhythm, no murmur ABD: abdomen is soft, nontender.  no hepatosplenomegaly.  not distended.  no hernia.  Several healed (laparoscopic) surgical scars GENITALIA:  Normal male.   RECTAL: normal external and internal exam.  heme neg. PROSTATE:  Normal size.  No nodule MUSCULOSKELETAL: muscle bulk and strength are grossly normal.  no obvious joint swelling.  gait is normal and steady .   PULSES: dorsalis pedis intact bilat.  no carotid bruit NEURO:  cn 2-12 grossly intact.   readily moves all 4's.  sensation is intact to touch on the feet, but decreased from normal SKIN:  Normal texture and temperature.  No rash or suspicious lesion is visible.   NODES:  None palpable at the neck PSYCH: alert, oriented x3.  Does not appear anxious nor depressed.        Assessment & Plan:  Wellness visit today, with problems stable, except as noted.    SEPARATE EVALUATION FOLLOWS--EACH PROBLEM HERE IS NEW, NOT RESPONDING TO TREATMENT, OR POSES SIGNIFICANT RISK TO THE PATIENT'S HEALTH: HISTORY OF THE PRESENT ILLNESS: no cbg record, but states cbg's vary from 45-300.  There is no trend throughout the day. He does not take fe tabs PAST MEDICAL HISTORY reviewed and up to date today REVIEW OF SYSTEMS: Denies weight change and loc PHYSICAL EXAMINATION: VITAL SIGNS:  See vs page GENERAL: no distress EXTEMITIES: no deformity.  no ulcer on the feet.  feet are of normal color and temp.  1+ bilat leg edema.  Healed surgical scars at the right leg.  There is bilateral onychomycosis.  There is hyperpigmntation of the legs. LAB/XRAY RESULTS: Lab Results  Component Value Date   WBC 14.4* 05/12/2012   HGB 13.6 05/12/2012   HCT 41.1 05/12/2012   PLT 246 05/12/2012   GLUCOSE 213* 05/12/2012   CHOL 135 05/12/2012   TRIG 320.0* 05/12/2012   HDL 13.50* 05/12/2012   LDLDIRECT 59.6 05/12/2012   LDLCALC  Value: UNABLE TO CALCULATE IF TRIGLYCERIDE OVER 400 mg/dL        Total Cholesterol/HDL:CHD Risk Coronary Heart Disease Risk Table                     Men   Women  1/2 Average Risk   3.4   3.3 05/07/2008   ALT 81* 05/12/2012   AST 88* 05/12/2012   NA 137 05/12/2012   K 4.2 05/12/2012   CL 105 05/12/2012   CREATININE 1.3 05/12/2012   BUN 19 05/12/2012   CO2 24 05/12/2012   TSH 5.53* 05/12/2012   PSA 0.70 05/12/2012   INR 1.4 05/14/2012   HGBA1C 8.6* 05/12/2012   MICROALBUR 55.1* 05/12/2012  IMPRESSION: DM.  needs increased rx.  i need cbg info in order to safely increase insulin Hyperkalemia.  We need to reduce the florinef as mush as we can, due to heart and renal dz Elita Boone, recurrent PLAN: See instruction page

## 2012-05-12 NOTE — Patient Instructions (Addendum)
please consider these measures for your health:  minimize alcohol.  do not use tobacco products.  have a colonoscopy at least every 10 years from age 69.  keep firearms safely stored.  always use seat belts.  have working smoke alarms in your home.  see an eye doctor and dentist regularly.  never drive under the influence of alcohol or drugs (including prescription drugs).  those with fair skin should take precautions against the sun. blood tests are being requested for you today.  You will receive a letter with results. Please come back for a follow-up appointment in 3 months.

## 2012-05-12 NOTE — Telephone Encounter (Signed)
Called pt to inform of lab results, pt informed (letter also mailed to pt). Pt would like rx for testosterone. Pt also transferred to scheduler to schedule F/U appointment in 1-2 weeks.

## 2012-05-13 LAB — CBC WITH DIFFERENTIAL/PLATELET
Basophils Relative: 3 % — ABNORMAL HIGH (ref 0–1)
Eosinophils Relative: 0 % (ref 0–5)
HCT: 41.1 % (ref 39.0–52.0)
Lymphocytes Relative: 16 % (ref 12–46)
MCV: 93.4 fL (ref 78.0–100.0)
Monocytes Absolute: 0.9 10*3/uL (ref 0.1–1.0)
RDW: 16.2 % — ABNORMAL HIGH (ref 11.5–15.5)
WBC: 14.4 10*3/uL — ABNORMAL HIGH (ref 4.0–10.5)

## 2012-05-13 LAB — PTH, INTACT AND CALCIUM: PTH: 56.9 pg/mL (ref 14.0–72.0)

## 2012-05-13 NOTE — Telephone Encounter (Signed)
Pt informed of MD's advisement. 

## 2012-05-13 NOTE — Telephone Encounter (Signed)
i'm sorry i was not clear.  i meant to say that we need another blood test (which you can do when you return soon), to determine if yo can take the pill by mouth, or if you would need to take the (more expensive) skin cream.

## 2012-05-14 ENCOUNTER — Ambulatory Visit (INDEPENDENT_AMBULATORY_CARE_PROVIDER_SITE_OTHER): Payer: Medicare Other | Admitting: *Deleted

## 2012-05-14 DIAGNOSIS — I743 Embolism and thrombosis of arteries of the lower extremities: Secondary | ICD-10-CM

## 2012-05-14 LAB — POCT INR: INR: 1.4

## 2012-05-14 MED ORDER — WARFARIN SODIUM 2 MG PO TABS: ORAL_TABLET | ORAL | Status: DC

## 2012-05-16 LAB — URINE CULTURE: Colony Count: >=100000

## 2012-05-17 MED ORDER — CEFUROXIME AXETIL 500 MG PO TABS: 500.0000 mg | ORAL_TABLET | Freq: Two times a day (BID) | ORAL | Status: AC

## 2012-05-28 ENCOUNTER — Ambulatory Visit (INDEPENDENT_AMBULATORY_CARE_PROVIDER_SITE_OTHER): Payer: Medicare Other | Admitting: Pharmacist

## 2012-05-28 DIAGNOSIS — I743 Embolism and thrombosis of arteries of the lower extremities: Secondary | ICD-10-CM

## 2012-05-28 LAB — POCT INR: INR: 1.3

## 2012-05-31 ENCOUNTER — Other Ambulatory Visit (INDEPENDENT_AMBULATORY_CARE_PROVIDER_SITE_OTHER): Payer: Medicare Other

## 2012-05-31 ENCOUNTER — Ambulatory Visit (INDEPENDENT_AMBULATORY_CARE_PROVIDER_SITE_OTHER): Payer: Medicare Other | Admitting: Endocrinology

## 2012-05-31 ENCOUNTER — Encounter: Payer: Self-pay | Admitting: Endocrinology

## 2012-05-31 VITALS — BP 126/62 | HR 68 | Temp 98.1°F | Ht 70.0 in | Wt 310.0 lb

## 2012-05-31 DIAGNOSIS — N390 Urinary tract infection, site not specified: Secondary | ICD-10-CM

## 2012-05-31 DIAGNOSIS — K7689 Other specified diseases of liver: Secondary | ICD-10-CM

## 2012-05-31 DIAGNOSIS — E291 Testicular hypofunction: Secondary | ICD-10-CM

## 2012-05-31 LAB — URINALYSIS, ROUTINE W REFLEX MICROSCOPIC
Bilirubin Urine: NEGATIVE
Nitrite: NEGATIVE
Specific Gravity, Urine: >=1.03 (ref 1.000–1.030)
pH: 6 (ref 5.0–8.0)

## 2012-05-31 LAB — HEPATIC FUNCTION PANEL
AST: 94 U/L — ABNORMAL HIGH (ref 0–37)
Albumin: 3.5 g/dL (ref 3.5–5.2)
Alkaline Phosphatase: 94 U/L (ref 39–117)
Total Protein: 8.4 g/dL — ABNORMAL HIGH (ref 6.0–8.3)

## 2012-05-31 NOTE — Patient Instructions (Addendum)
blood and urine tests are being requested for you today.  You will receive a letter with results.  If the liver tests are the same, i'll have to ask dr Jarold Motto about it.   change insulin to 3x a day (just before each meal) 200-200-200-150 units.   Because the anemia is better, you don't need iron for now.   Please come back for a follow-up appointment in 3 months.  We'll recheck the white-blood cells then.

## 2012-05-31 NOTE — Progress Notes (Signed)
Subjective:    Patient ID: [name], male    DOB: 10/11/1941, 71 y.o.   MRN: [id number]  HPI The state of at least three ongoing medical problems is addressed today: Pt has finished rx for uti.  No urinary sxs. DM: he sometimes takes less than the prescribed dosage of insulin, due to hypoglycemia.  he brings a record of his cbg's which i have reviewed today.  It varies from 59-400.  It is in general lowest in am, and highest before lunch NASH: Denies abdominal pain. Hypogonadism: He says he feels ok off the clomid, and does not wish to resume now Past Medical History  Diagnosis Date  . Diabetes mellitus   . Hypertension   . COPD (chronic obstructive pulmonary disease)     CPAP  . Renal insufficiency   . Hyperlipemia   . Anemia   . Other and unspecified coagulation defects   . Hypothyroidism   . CAD (coronary artery disease)   . Hyperkalemia   . Osteoarthritis   . Glaucoma   . Sick sinus syndrome     s/p PPM by JA  . Pancreatitis   . Obesity   . Constipation   . OSA on CPAP     using CPAP although it is hard for him to tolerate  . SOB (shortness of breath)     Past Surgical History  Procedure Date  . Nasal septum surgery 1967  . Pacemaker placement 06/2010    [facility] [employer] RF DR, Model [id number] ( Serial number [id number])  . Thromboembolectomy and four compartment fasciotomy 2009  . Cholecystectomy     History   Social History  . Marital Status: Married    Spouse Name: N/A    Number of Children: N/A  . 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 Smoking status: Former Smoker -- 2.0 packs/day for 50 years    Types: Cigarettes    Quit date: 12/08/2006  . Smokeless tobacco: Never Used  . Alcohol Use: No  . Drug Use: No  . Sexually Active: Not on file   Other Topics Concern  . Not on file   Social History Narrative  . No narrative on file    Current Outpatient Prescriptions  on File Prior to Visit  Medication Sig Dispense Refill  . Ascorbic Acid (VITAMIN C) 500 MG tablet Take 500 mg by mouth daily.        Marland Kitchen aspirin 81 MG tablet Take 81 mg by mouth daily.        . B-D INS SYR ULTRAFINE 1CC/30G 30G X 1/2" 1 ML MISC USE FOR INSULIN TO TEST 4 TIMES DAILY  400 each  3  . Bimatoprost (LUMIGAN) 0.01 % SOLN Apply to eye. Once daily-one drop both eyes       . brimonidine (ALPHAGAN P) 0.1 % SOLN One drop both eyes twice daily       . Cholecalciferol (VITAMIN D3) 400 UNITS CAPS Take 1,000 Units by mouth daily.       . CRESTOR 40 MG tablet TAKE 1/2 TABLET BY MOUTH 3 TIMES WEEKLY(MONDAY,WEDNESDAY,FRIDAY)  30 tablet  3  . dextromethorphan-guaiFENesin (MUCINEX DM) 30-600 MG per 12 hr tablet Take 1 tablet by mouth as needed.       . dorzolamide (TRUSOPT) 2 % ophthalmic solution 1 drop. One drop both eyes twice daily       .  fenofibrate micronized (LOFIBRA) 200 MG capsule TAKE 1 CAPSULE EVERY DAY  30 capsule  5  . fludrocortisone (FLORINEF) 0.1 MG tablet 3 (three) times a week.       . fluticasone (VERAMYST) 27.5 MCG/SPRAY nasal spray Place 2 sprays into the nose daily as needed.      . furosemide (LASIX) 20 MG tablet TAKE 1 TABLET EVERY DAY  30 tablet  11  . glucose blood (ONE TOUCH ULTRA TEST) test strip Use as instructed four times daily dx 250.43  360 each  3  . insulin regular human CONCENTRATED (HUMULIN R) 500 UNIT/ML SOLN injection 3x a day (just before each meal) 200-200-200-150 units      . mometasone (NASONEX) 50 MCG/ACT nasal spray        . Multiple Vitamin (MULTIVITAMIN) tablet Take 1 tablet by mouth daily.        . OMEGA-3 1000 MG CAPS Take 1 capsule by mouth daily.       Marland Kitchen omeprazole (PRILOSEC) 20 MG capsule TAKE AS DIRECTED  30 capsule  5  . PROAIR HFA 108 (90 BASE) MCG/ACT inhaler INHALE 2 PUFFS EVERY 4 TO 6 HOURS AS NEEDED  8.5 g  8  . Probiotic Product (PROBIOTIC ACIDOPHILUS) CAPS Take by mouth.        . SYNTHROID 50 MCG tablet TAKE 1 TABLET EVERY DAY  30 tablet   8  . warfarin (COUMADIN) 2 MG tablet Take as directed by anticoagulation clinic  90 tablet  3  . warfarin (COUMADIN) 4 MG tablet Take 4 mg by mouth daily. As directed      . ZENPEP 25000 UNITS CPEP TAKE 2 CAPSULES BY MOUTH 3 TIMES A DAY WITH MEALS  180 capsule  4  . DISCONTD: NASONEX 50 MCG/ACT nasal spray INHALE 2 PUFFS IN EACH NOSTRIL DAILY  17 g  5    Allergies  Allergen Reactions  . Pioglitazone     ACTOS  REACTION: Edema    Family History  Problem Relation Age of Onset  . Liver cancer Mother     deceased age 62  . Cancer Mother     liver cancer  . Colon cancer Neg Hx   . Heart attack Father     BP 126/62  Pulse 68  Temp 98.1 F (36.7 C) (Oral)  Ht 5\' 10"  (1.778 m)  Wt 310 lb (140.615 kg)  BMI 44.48 kg/m2  SpO2 97%    Review of Systems Denies LOC and weight change.     Objective:   Physical Exam VITAL SIGNS:  See vs page GENERAL: no distress SKIN:  Insulin injection sites at the anterior abdomen are normal.  Lab Results  Component Value Date   WBC 14.4* 05/12/2012   HGB 13.6 05/12/2012   HCT 41.1 05/12/2012   PLT 246 05/12/2012   GLUCOSE 213* 05/12/2012   CHOL 135 05/12/2012   TRIG 320.0* 05/12/2012   HDL 13.50* 05/12/2012   LDLDIRECT 59.6 05/12/2012   LDLCALC  Value: UNABLE TO CALCULATE IF TRIGLYCERIDE OVER 400 mg/dL        Total Cholesterol/HDL:CHD Risk Coronary Heart Disease Risk Table                     Men   Women  1/2 Average Risk   3.4   3.3 05/07/2008   ALT 96* 05/31/2012   AST 94* 05/31/2012   NA 137 05/12/2012   K 4.2 05/12/2012   CL 105 05/12/2012   CREATININE  1.3 05/12/2012   BUN 19 05/12/2012   CO2 24 05/12/2012   TSH 5.53* 05/12/2012   PSA 0.70 05/12/2012   INR 1.3 05/28/2012   HGBA1C 8.6* 05/12/2012   MICROALBUR 55.1* 05/12/2012      Assessment & Plan:  UTI, better elev transaminases, prob due to nash, improved Anemia, improved DM.  needs increased rx Hypogonadism, rx declined

## 2012-06-01 ENCOUNTER — Telehealth: Payer: Self-pay | Admitting: *Deleted

## 2012-06-01 NOTE — Telephone Encounter (Signed)
Called pt to inform of lab results, pt informed (letter also mailed to pt). 

## 2012-06-04 ENCOUNTER — Telehealth: Payer: Self-pay | Admitting: Endocrinology

## 2012-06-04 NOTE — Telephone Encounter (Signed)
Patient requesting NPI number or DEA number for Dr. Romero Belling. 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 RN advised for patient to bring forms to office for completion.  Patient declines. Patient can be reached @ [phone].

## 2012-06-04 NOTE — Telephone Encounter (Signed)
Returned call to pt regarding his request in an attempt to advise that we cannot give this type of information out because of how it can be used. Pt interrupted me and stated that he lives in [place] and does not want to drive to office just to show [place] his forms and "unless Dr Everardo All would like me to look for a new doctor he should get some more competent and cooperative staff" and hung up the phone. Please advise.

## 2012-06-15 ENCOUNTER — Ambulatory Visit (INDEPENDENT_AMBULATORY_CARE_PROVIDER_SITE_OTHER): Payer: Medicare Other | Admitting: Pharmacist

## 2012-06-15 DIAGNOSIS — I743 Embolism and thrombosis of arteries of the lower extremities: Secondary | ICD-10-CM

## 2012-06-23 ENCOUNTER — Encounter: Payer: Self-pay | Admitting: Internal Medicine

## 2012-06-23 ENCOUNTER — Ambulatory Visit (INDEPENDENT_AMBULATORY_CARE_PROVIDER_SITE_OTHER): Payer: Medicare Other | Admitting: Internal Medicine

## 2012-06-23 ENCOUNTER — Ambulatory Visit (INDEPENDENT_AMBULATORY_CARE_PROVIDER_SITE_OTHER): Payer: Medicare Other | Admitting: Pharmacist

## 2012-06-23 VITALS — BP 128/56 | HR 57 | Resp 19 | Ht 70.0 in | Wt 321.0 lb

## 2012-06-23 DIAGNOSIS — I743 Embolism and thrombosis of arteries of the lower extremities: Secondary | ICD-10-CM

## 2012-06-23 DIAGNOSIS — I5032 Chronic diastolic (congestive) heart failure: Secondary | ICD-10-CM

## 2012-06-23 DIAGNOSIS — I10 Essential (primary) hypertension: Secondary | ICD-10-CM

## 2012-06-23 DIAGNOSIS — I498 Other specified cardiac arrhythmias: Secondary | ICD-10-CM

## 2012-06-23 DIAGNOSIS — I4891 Unspecified atrial fibrillation: Secondary | ICD-10-CM

## 2012-06-23 DIAGNOSIS — R5381 Other malaise: Secondary | ICD-10-CM

## 2012-06-23 DIAGNOSIS — R5383 Other fatigue: Secondary | ICD-10-CM

## 2012-06-23 LAB — BASIC METABOLIC PANEL
BUN: 26 mg/dL — ABNORMAL HIGH (ref 6–23)
CO2: 22 mEq/L (ref 19–32)
Chloride: 108 mEq/L (ref 96–112)
Potassium: 4.6 mEq/L (ref 3.5–5.1)

## 2012-06-23 LAB — CBC WITH DIFFERENTIAL/PLATELET
Basophils Relative: 0.5 % (ref 0.0–3.0)
Eosinophils Relative: 3.1 % (ref 0.0–5.0)
HCT: 39.7 % (ref 39.0–52.0)
Lymphs Abs: 2.4 10*3/uL (ref 0.7–4.0)
MCHC: 33.2 g/dL (ref 30.0–36.0)
MCV: 96 fl (ref 78.0–100.0)
Monocytes Absolute: 0.6 10*3/uL (ref 0.1–1.0)
Platelets: 227 10*3/uL (ref 150.0–400.0)
RBC: 4.14 Mil/uL — ABNORMAL LOW (ref 4.22–5.81)
WBC: 7 10*3/uL (ref 4.5–10.5)

## 2012-06-23 LAB — PACEMAKER DEVICE OBSERVATION
AL IMPEDENCE PM: 400 Ohm
ATRIAL PACING PM: 93
RV LEAD IMPEDENCE PM: 400 Ohm
VENTRICULAR PACING PM: 1

## 2012-06-23 MED ORDER — WARFARIN SODIUM 4 MG PO TABS: ORAL_TABLET | ORAL | Status: DC

## 2012-06-23 NOTE — Assessment & Plan Note (Signed)
Normal pacemaker function See Pace Art report No changes today  

## 2012-06-23 NOTE — Assessment & Plan Note (Signed)
Stable  Consider weaning florinef further (presently taking only 3 days per week). Ideally we could stop this. He has venous insufficiency and may benefit from support hose.  Given recent leg injury with a lawn mower, I will not start this today.

## 2012-06-23 NOTE — Patient Instructions (Signed)
Your physician wants you to follow-up in: 6 months in the device clinic and 12 months with Dr Johney Frame Bonita Quin will receive a reminder letter in the mail two months in advance. If you don't receive a letter, please call our office to schedule the follow-up appointment.  Your physician recommends that you return for lab work today

## 2012-06-23 NOTE — Assessment & Plan Note (Signed)
Continue coumadin longterm 

## 2012-06-23 NOTE — Assessment & Plan Note (Signed)
He is making no progress with weight loss His size significant impacts his health and long term survival

## 2012-06-23 NOTE — Assessment & Plan Note (Signed)
Fatigue and dizziness are more prominent today Denies evidence of bleeding and reports BS 120s this am. Normal pacemaker function today  Check BMET and CBC today to evaluate for causes of fatigue and dizziness. He should follow-up with Dr Everardo All if not improved.

## 2012-06-23 NOTE — Assessment & Plan Note (Signed)
Pacemaker interrogation today reveals that his afib is well controlled (<1%), longest episode was 3 minutes No changes today

## 2012-06-23 NOTE — Progress Notes (Signed)
PCP: Romero Belling, MD  The patient presents today for routine electrophysiology followup.  Since last being seen in our clinic, the patient reports doing reasonably well.  He has stable edema and mild SOB.  He injured his leg earlier this week while loading his lawn mower on a trailer.  He has been placed on antibiotics after evaluation yesterday.  He reports feeling "tired and washed out" since waking this am.  He ate breakfast and reports blood sugar at home was 120s.  He reports associated mild dizziness but denies presyncoe or syncope.  Today, he denies symptoms of palpitations, chest pain, orthopnea, PND, or neurologic sequela.  The patient feels that he is tolerating medications without difficulties and is otherwise without complaint today.   Past Medical History  Diagnosis Date  . Diabetes mellitus   . Hypertension   . COPD (chronic obstructive pulmonary disease)     CPAP  . Renal insufficiency   . Hyperlipemia   . Anemia   . Other and unspecified coagulation defects   . Hypothyroidism   . CAD (coronary artery disease)   . Hyperkalemia   . Osteoarthritis   . Glaucoma   . Sick sinus syndrome     s/p PPM by JA  . Pancreatitis   . Obesity   . Constipation   . OSA on CPAP     using CPAP although it is hard for him to tolerate  . SOB (shortness of breath)    Past Surgical History  Procedure Date  . Nasal septum surgery 1967  . Pacemaker placement 06/2010    [facility] [employer] RF DR, Model [id number] ( Serial number [id number])  . Thromboembolectomy and four compartment fasciotomy 2009  . Cholecystectomy     Current Outpatient Prescriptions  Medication Sig Dispense Refill  . Ascorbic Acid (VITAMIN C) 500 MG tablet Take 500 mg by mouth daily.        Marland Kitchen aspirin 81 MG tablet Take 81 mg by mouth daily.        . B-D INS SYR ULTRAFINE 1CC/30G 30G X 1/2" 1 ML MISC USE FOR INSULIN TO TEST 4 TIMES DAILY  400 each  3  . Bimatoprost (LUMIGAN) 0.01 % SOLN Apply to eye. Once daily-one drop  both eyes       . brimonidine (ALPHAGAN P) 0.1 % SOLN One drop both eyes twice daily       . Cholecalciferol (VITAMIN D3) 400 UNITS CAPS Take 1,000 Units by mouth daily.       . CRESTOR 40 MG tablet TAKE 1/2 TABLET BY MOUTH 3 TIMES WEEKLY(MONDAY,WEDNESDAY,FRIDAY)  30 tablet  3  . dextromethorphan-guaiFENesin (MUCINEX DM) 30-600 MG per 12 hr tablet Take 1 tablet by mouth as needed.       . dorzolamide (TRUSOPT) 2 % ophthalmic solution 1 drop. One drop both eyes twice daily       . fenofibrate micronized (LOFIBRA) 200 MG capsule TAKE 1 CAPSULE EVERY DAY  30 capsule  5  . fludrocortisone (FLORINEF) 0.1 MG tablet 3 (three) times a week.       . fluticasone (VERAMYST) 27.5 MCG/SPRAY nasal spray Place 2 sprays into the nose daily as needed.      . furosemide (LASIX) 20 MG tablet TAKE 1 TABLET EVERY DAY  30 tablet  11  . glucose blood (ONE TOUCH ULTRA TEST) test strip Use as instructed four times daily dx 250.43  360 each  3  . insulin regular human CONCENTRATED (HUMULIN R) 500  UNIT/ML SOLN injection 3x a day (just before each meal) 200-200-200-150 units      . mometasone (NASONEX) 50 MCG/ACT nasal spray        . Multiple Vitamin (MULTIVITAMIN) tablet Take 1 tablet by mouth daily.        . OMEGA-3 1000 MG CAPS Take 1 capsule by mouth daily.       Marland Kitchen omeprazole (PRILOSEC) 20 MG capsule TAKE AS DIRECTED  30 capsule  5  . PROAIR HFA 108 (90 BASE) MCG/ACT inhaler INHALE 2 PUFFS EVERY 4 TO 6 HOURS AS NEEDED  8.5 g  8  . Probiotic Product (PROBIOTIC ACIDOPHILUS) CAPS Take by mouth.        . SYNTHROID 50 MCG tablet TAKE 1 TABLET EVERY DAY  30 tablet  8  . ZENPEP 25000 UNITS CPEP TAKE 2 CAPSULES BY MOUTH 3 TIMES A DAY WITH MEALS  180 capsule  4  . DISCONTD: warfarin (COUMADIN) 2 MG tablet Take as directed by anticoagulation clinic  90 tablet  3  . DISCONTD: warfarin (COUMADIN) 4 MG tablet Take 4 mg by mouth daily. As directed      . cephALEXin (KEFLEX) 500 MG capsule       . warfarin (COUMADIN) 4 MG tablet  Take as directed by Anticoagulation clinic  60 tablet  2  . DISCONTD: NASONEX 50 MCG/ACT nasal spray INHALE 2 PUFFS IN EACH NOSTRIL DAILY  17 g  5    Allergies  Allergen Reactions  . Pioglitazone     ACTOS  REACTION: Edema    History   Social History  . Marital Status: Married    Spouse Name: N/A    Number of Children: N/A  . 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 Smoking status: Former Smoker -- 2.0 packs/day for 50 years    Types: Cigarettes    Quit date: 12/08/2006  . Smokeless tobacco: Never Used  . Alcohol Use: No  . Drug Use: No  . Sexually Active: Not on file   Other Topics Concern  . Not on file   Social History Narrative  . No narrative on file    Family History  Problem Relation Age of Onset  . Liver cancer Mother     deceased age 37  . Cancer Mother     liver cancer  . Colon cancer Neg Hx   . Heart attack Father      Physical Exam: Filed Vitals:   06/23/12 1108  BP: 128/56  Pulse: 57  Resp: 19  Height: 5\' 10"  (1.778 m)  Weight: 321 lb (145.605 kg)  SpO2: 94%    GEN- The patient is overweight appearing, alert and oriented x 3 today.   Head- normocephalic, atraumatic Eyes-  Sclera clear, conjunctiva pink Ears- hearing intact Oropharynx- clear Neck- supple, no JVP Lungs- Clear to ausculation bilaterally, normal work of breathing Chest- pacemaker pocket is well healed Heart- Regular rate and rhythm, no murmurs, rubs or gallops, PMI not laterally displaced GI- soft, NT, ND, + BS Extremities- no clubbing, cyanosis, 1+ chronic BLE edema unchanged form prior visit MS- no significant deformity or atrophy Skin- no rash or lesion Psych- euthymic mood, full affect Neuro- strength and sensation are intact  Pacemaker interrogation- reviewed in detail today,  See PACEART report  Assessment and Plan:

## 2012-07-07 ENCOUNTER — Ambulatory Visit (INDEPENDENT_AMBULATORY_CARE_PROVIDER_SITE_OTHER): Payer: Medicare Other | Admitting: *Deleted

## 2012-07-07 DIAGNOSIS — I743 Embolism and thrombosis of arteries of the lower extremities: Secondary | ICD-10-CM

## 2012-07-07 LAB — POCT INR: INR: 2.1

## 2012-07-26 ENCOUNTER — Other Ambulatory Visit: Payer: Self-pay | Admitting: Endocrinology

## 2012-07-28 ENCOUNTER — Ambulatory Visit (INDEPENDENT_AMBULATORY_CARE_PROVIDER_SITE_OTHER): Payer: Medicare Other | Admitting: *Deleted

## 2012-07-28 DIAGNOSIS — I743 Embolism and thrombosis of arteries of the lower extremities: Secondary | ICD-10-CM

## 2012-07-28 LAB — POCT INR: INR: 1.3

## 2012-08-06 ENCOUNTER — Ambulatory Visit (INDEPENDENT_AMBULATORY_CARE_PROVIDER_SITE_OTHER): Payer: Medicare Other | Admitting: *Deleted

## 2012-08-06 DIAGNOSIS — I743 Embolism and thrombosis of arteries of the lower extremities: Secondary | ICD-10-CM

## 2012-08-06 LAB — POCT INR: INR: 2.1

## 2012-08-08 ENCOUNTER — Other Ambulatory Visit: Payer: Self-pay | Admitting: Endocrinology

## 2012-08-11 ENCOUNTER — Encounter: Payer: Self-pay | Admitting: General Practice

## 2012-08-11 ENCOUNTER — Ambulatory Visit: Payer: Medicare Other | Admitting: Endocrinology

## 2012-08-16 ENCOUNTER — Other Ambulatory Visit: Payer: Self-pay | Admitting: Endocrinology

## 2012-08-20 ENCOUNTER — Ambulatory Visit: Payer: Medicare Other | Admitting: Pulmonary Disease

## 2012-09-01 ENCOUNTER — Encounter: Payer: Self-pay | Admitting: Endocrinology

## 2012-09-01 ENCOUNTER — Ambulatory Visit (INDEPENDENT_AMBULATORY_CARE_PROVIDER_SITE_OTHER): Payer: Medicare Other | Admitting: Endocrinology

## 2012-09-01 ENCOUNTER — Other Ambulatory Visit (INDEPENDENT_AMBULATORY_CARE_PROVIDER_SITE_OTHER): Payer: Medicare Other

## 2012-09-01 VITALS — BP 126/80 | HR 80 | Temp 98.1°F | Wt 321.0 lb

## 2012-09-01 DIAGNOSIS — E1065 Type 1 diabetes mellitus with hyperglycemia: Secondary | ICD-10-CM

## 2012-09-01 DIAGNOSIS — E1029 Type 1 diabetes mellitus with other diabetic kidney complication: Secondary | ICD-10-CM

## 2012-09-01 DIAGNOSIS — D509 Iron deficiency anemia, unspecified: Secondary | ICD-10-CM

## 2012-09-01 DIAGNOSIS — N058 Unspecified nephritic syndrome with other morphologic changes: Secondary | ICD-10-CM

## 2012-09-01 DIAGNOSIS — N390 Urinary tract infection, site not specified: Secondary | ICD-10-CM

## 2012-09-01 LAB — CBC WITH DIFFERENTIAL/PLATELET
Basophils Relative: 0.5 % (ref 0.0–3.0)
Eosinophils Relative: 2.6 % (ref 0.0–5.0)
Hemoglobin: 14.4 g/dL (ref 13.0–17.0)
Lymphocytes Relative: 37.2 % (ref 12.0–46.0)
MCHC: 32.9 g/dL (ref 30.0–36.0)
Monocytes Relative: 7.8 % (ref 3.0–12.0)
Neutro Abs: 3.8 10*3/uL (ref 1.4–7.7)
RBC: 4.49 Mil/uL (ref 4.22–5.81)

## 2012-09-01 LAB — URINALYSIS, ROUTINE W REFLEX MICROSCOPIC
Specific Gravity, Urine: 1.025 (ref 1.000–1.030)
Total Protein, Urine: 100
Urine Glucose: 500

## 2012-09-01 NOTE — Patient Instructions (Addendum)
blood tests are being requested for you today.  You will be contacted with results. change insulin to 3x a day (just before each meal) 200-200-200-150 units.   Please come back for a follow-up appointment in 3-4 months. check your blood sugar twice a day.  vary the time of day when you check, between before the 3 meals, and at bedtime.  also check if you have symptoms of your blood sugar being too high or too low.  please keep a record of the readings and bring it to your next appointment here.  please call us sooner if your blood sugar goes below 70, or if you have a lot of readings over 200.

## 2012-09-01 NOTE — Progress Notes (Signed)
Subjective:    Patient ID: [name], male    DOB: 1941-09-11, 71 y.o.   MRN: [id number]  HPI Pt return for f/u of insulin-requiring DM (dx'ed 1988; complicated by renal insufficiency, peripheral sensory neuropathy, and CAD; characterized by severe insulin resistance).  he brings a record of his cbg's which i have reviewed today.  It varies from 68-180.  There is no trend throughout the day. Leukocytosis: denies fever UTI: denies dysuria Past Medical History  Diagnosis Date  . Diabetes mellitus   . Hypertension   . COPD (chronic obstructive pulmonary disease)     CPAP  . Renal insufficiency   . Hyperlipemia   . Anemia   . Other and unspecified coagulation defects   . Hypothyroidism   . CAD (coronary artery disease)   . Hyperkalemia   . Osteoarthritis   . Glaucoma   . Sick sinus syndrome     s/p PPM by JA  . Pancreatitis   . Obesity   . Constipation   . OSA on CPAP     using CPAP although it is hard for him to tolerate  . SOB (shortness of breath)     Past Surgical History  Procedure Date  . Nasal septum surgery 1967  . Pacemaker placement 06/2010    [facility] [employer] RF DR, Model [phone] ( Serial number [id number])  . Thromboembolectomy and four compartment fasciotomy 2009  . Cholecystectomy     History   Social History  . Marital Status: Married    Spouse Name: N/A    Number of Children: N/A  . 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 Smoking status: Former Smoker -- 2.0 packs/day for 50 years    Types: Cigarettes    Quit date: 12/08/2006  . Smokeless tobacco: Never Used  . Alcohol Use: No  . Drug Use: No  . Sexually Active: Not on file   Other Topics Concern  . Not on file   Social History Narrative  . No narrative on file    Current Outpatient Prescriptions on File Prior to Visit  Medication Sig Dispense Refill  . Ascorbic Acid (VITAMIN C) 500 MG tablet Take 500 mg by  mouth daily.        Marland Kitchen aspirin 81 MG tablet Take 81 mg by mouth daily.        . B-D INS SYR ULTRAFINE 1CC/30G 30G X 1/2" 1 ML MISC USE FOR INSULIN TO TEST 4 TIMES DAILY  400 each  3  . Bimatoprost (LUMIGAN) 0.01 % SOLN Apply to eye. Once daily-one drop both eyes       . brimonidine (ALPHAGAN P) 0.1 % SOLN One drop both eyes twice daily       . cephALEXin (KEFLEX) 500 MG capsule       . Cholecalciferol (VITAMIN D3) 400 UNITS CAPS Take 1,000 Units by mouth daily.       . CRESTOR 40 MG tablet TAKE 1/2 TABLET BY MOUTH 3 TIMES WEEKLY(MONDAY,WEDNESDAY,FRIDAY)  30 tablet  3  . dextromethorphan-guaiFENesin (MUCINEX DM) 30-600 MG per 12 hr tablet Take 1 tablet by mouth as needed.       . dorzolamide (TRUSOPT) 2 % ophthalmic solution 1 drop. One drop both eyes twice daily       . fenofibrate micronized (LOFIBRA) 200 MG capsule TAKE 1 CAPSULE EVERY DAY  30 capsule  5  .  fludrocortisone (FLORINEF) 0.1 MG tablet 3 (three) times a week.       . fludrocortisone (FLORINEF) 0.1 MG tablet TAKE 1 TABLET BY MOUTH DAILY  30 tablet  3  . fluticasone (VERAMYST) 27.5 MCG/SPRAY nasal spray Place 2 sprays into the nose daily as needed.      . furosemide (LASIX) 20 MG tablet TAKE 1 TABLET EVERY DAY  30 tablet  11  . glucose blood (ONE TOUCH ULTRA TEST) test strip Use as instructed four times daily dx 250.43  360 each  3  . insulin regular human CONCENTRATED (HUMULIN R) 500 UNIT/ML SOLN injection 3x a day (just before each meal) 225-225-225-150 units      . levothyroxine (SYNTHROID, LEVOTHROID) 50 MCG tablet TAKE 1 TABLET EVERY DAY  30 tablet  7  . mometasone (NASONEX) 50 MCG/ACT nasal spray        . Multiple Vitamin (MULTIVITAMIN) tablet Take 1 tablet by mouth daily.        . OMEGA-3 1000 MG CAPS Take 1 capsule by mouth daily.       Marland Kitchen omeprazole (PRILOSEC) 20 MG capsule TAKE AS DIRECTED  30 capsule  5  . PROAIR HFA 108 (90 BASE) MCG/ACT inhaler INHALE 2 PUFFS EVERY 4 TO 6 HOURS AS NEEDED  8.5 g  8  . Probiotic Product  (PROBIOTIC ACIDOPHILUS) CAPS Take by mouth.        . warfarin (COUMADIN) 4 MG tablet Take as directed by Anticoagulation clinic  60 tablet  2  . ZENPEP 25000 UNITS CPEP TAKE 2 CAPSULES BY MOUTH 3 TIMES A DAY WITH MEALS  180 capsule  4  . DISCONTD: NASONEX 50 MCG/ACT nasal spray INHALE 2 PUFFS IN EACH NOSTRIL DAILY  17 g  5    Allergies  Allergen Reactions  . Pioglitazone     ACTOS  REACTION: Edema    Family History  Problem Relation Age of Onset  . Liver cancer Mother     deceased age 31  . Cancer Mother     liver cancer  . Colon cancer Neg Hx   . Heart attack Father     BP 126/80  Pulse 80  Temp 98.1 F (36.7 C) (Oral)  Wt 321 lb (145.605 kg)  SpO2 95%    Review of Systems He has mild hypoglycemia at any time of day, but no LOC.   No fever    Objective:   Physical Exam VITAL SIGNS:  See vs page GENERAL: no distress Pulses: dorsalis pedis intact bilat.  no carotid bruit EXTEMITIES: no deformity.  no ulcer on the feet.  feet are of normal color and temp.  1+ bilat leg edema.  Healed surgical scars at the right leg.  There is bilateral onychomycosis.  There is hyperpigmentation of the legs.   Neuro: sensation is intact to touch on the feet, but decreased from normal.   Lab Results  Component Value Date   HGBA1C 8.5* 09/01/2012  (i reviewed ua result)    Assessment & Plan:  DM: needs increased rx elev WBC, resolved UTI, better

## 2012-09-08 ENCOUNTER — Ambulatory Visit (INDEPENDENT_AMBULATORY_CARE_PROVIDER_SITE_OTHER): Payer: Medicare Other | Admitting: *Deleted

## 2012-09-08 DIAGNOSIS — I743 Embolism and thrombosis of arteries of the lower extremities: Secondary | ICD-10-CM

## 2012-09-08 LAB — POCT INR: INR: 2

## 2012-09-13 ENCOUNTER — Ambulatory Visit (INDEPENDENT_AMBULATORY_CARE_PROVIDER_SITE_OTHER): Payer: Medicare Other | Admitting: Pulmonary Disease

## 2012-09-13 ENCOUNTER — Encounter: Payer: Self-pay | Admitting: Pulmonary Disease

## 2012-09-13 VITALS — BP 140/70 | HR 62 | Temp 98.7°F | Ht 70.0 in | Wt 318.2 lb

## 2012-09-13 DIAGNOSIS — G4733 Obstructive sleep apnea (adult) (pediatric): Secondary | ICD-10-CM

## 2012-09-13 NOTE — Assessment & Plan Note (Signed)
The patient is compliant with CPAP every night, but his hours of use is variable.  He is unable to describe anything which may help improve his tolerance.  I have asked him to wear CPAP as long as he can each night, and to work aggressively on weight loss.  I've also asked him to keep up with his mask changes and supplies.

## 2012-09-13 NOTE — Progress Notes (Signed)
  Subjective:    Patient ID: [name], male    DOB: 05-Jul-1941, 71 y.o.   MRN: [id number]  HPI The patient comes in today for followup of his obstructive sleep apnea.  He is wearing CPAP every night, but can bear he between 3 hours and all night.  He is unsure why he cannot tolerate all night every single night.  He is unable to give me an issue to address that would improve his compliance.  He has been keeping up with his mask changes as well as supplies.  His daytime alertness is variable, depending upon how long he wears his device.  His weight is up about 8 pounds since last visit.   Review of Systems  Constitutional: Negative for fever and unexpected weight change.  HENT: Negative for ear pain, nosebleeds, congestion, sore throat, rhinorrhea, sneezing, trouble swallowing, dental problem, postnasal drip and sinus pressure.   Eyes: Negative for redness and itching.  Respiratory: Positive for shortness of breath. Negative for cough, chest tightness and wheezing.   Cardiovascular: Negative for palpitations and leg swelling.  Gastrointestinal: Negative for nausea and vomiting.  Genitourinary: Negative for dysuria.  Musculoskeletal: Negative for joint swelling.  Skin: Negative for rash.  Neurological: Negative for headaches.  Hematological: Does not bruise/bleed easily.  Psychiatric/Behavioral: Positive for disturbed wake/sleep cycle. Negative for dysphoric mood. The patient is not nervous/anxious.        Objective:   Physical Exam Obese male in no acute distress No skin breakdown or pressure necrosis from the CPAP mask Neck without lymphadenopathy or thyromegaly Lower extremities with 1+ edema bilaterally, no cyanosis Awake, but appears mildly sleepy, moves all 4 extremities.       Assessment & Plan:

## 2012-09-13 NOTE — Patient Instructions (Addendum)
Continue with cpap as much as you can, and let us know if there is any specific issue we can address to improve things.  Work on weight loss Keep up with mask changes and supplies.  followup with me in one year.

## 2012-10-01 ENCOUNTER — Other Ambulatory Visit: Payer: Self-pay | Admitting: Endocrinology

## 2012-10-06 ENCOUNTER — Ambulatory Visit (INDEPENDENT_AMBULATORY_CARE_PROVIDER_SITE_OTHER): Payer: Medicare Other | Admitting: General Practice

## 2012-10-06 DIAGNOSIS — I743 Embolism and thrombosis of arteries of the lower extremities: Secondary | ICD-10-CM

## 2012-10-06 LAB — POCT INR: INR: 2.4

## 2012-10-19 ENCOUNTER — Other Ambulatory Visit: Payer: Self-pay | Admitting: General Practice

## 2012-10-19 MED ORDER — WARFARIN SODIUM 4 MG PO TABS: ORAL_TABLET | ORAL | Status: DC

## 2012-11-17 ENCOUNTER — Ambulatory Visit (INDEPENDENT_AMBULATORY_CARE_PROVIDER_SITE_OTHER): Payer: Medicare Other | Admitting: General Practice

## 2012-11-17 DIAGNOSIS — I743 Embolism and thrombosis of arteries of the lower extremities: Secondary | ICD-10-CM

## 2012-11-18 ENCOUNTER — Other Ambulatory Visit: Payer: Self-pay | Admitting: Endocrinology

## 2012-11-23 ENCOUNTER — Other Ambulatory Visit: Payer: Self-pay | Admitting: Internal Medicine

## 2012-11-23 MED ORDER — FUROSEMIDE 20 MG PO TABS: 20.0000 mg | ORAL_TABLET | Freq: Every day | ORAL | Status: DC

## 2012-12-08 DIAGNOSIS — C679 Malignant neoplasm of bladder, unspecified: Secondary | ICD-10-CM

## 2012-12-08 HISTORY — DX: Malignant neoplasm of bladder, unspecified: C67.9

## 2012-12-15 ENCOUNTER — Ambulatory Visit: Payer: Medicare Other | Admitting: Endocrinology

## 2012-12-20 ENCOUNTER — Ambulatory Visit (INDEPENDENT_AMBULATORY_CARE_PROVIDER_SITE_OTHER): Payer: Medicare Other | Admitting: Endocrinology

## 2012-12-20 ENCOUNTER — Ambulatory Visit (INDEPENDENT_AMBULATORY_CARE_PROVIDER_SITE_OTHER): Payer: Medicare Other | Admitting: *Deleted

## 2012-12-20 ENCOUNTER — Encounter: Payer: Self-pay | Admitting: Internal Medicine

## 2012-12-20 VITALS — BP 136/80 | HR 76 | Temp 98.5°F | Wt 323.0 lb

## 2012-12-20 DIAGNOSIS — E875 Hyperkalemia: Secondary | ICD-10-CM

## 2012-12-20 DIAGNOSIS — Z950 Presence of cardiac pacemaker: Secondary | ICD-10-CM

## 2012-12-20 DIAGNOSIS — E1029 Type 1 diabetes mellitus with other diabetic kidney complication: Secondary | ICD-10-CM

## 2012-12-20 DIAGNOSIS — I498 Other specified cardiac arrhythmias: Secondary | ICD-10-CM

## 2012-12-20 DIAGNOSIS — E1065 Type 1 diabetes mellitus with hyperglycemia: Secondary | ICD-10-CM

## 2012-12-20 LAB — BASIC METABOLIC PANEL
GFR: 60.19 mL/min (ref >60.00)
Glucose, Bld: 208 mg/dL — ABNORMAL HIGH (ref 70–99)
Potassium: 4.4 mEq/L (ref 3.5–5.1)
Sodium: 139 mEq/L (ref 135–145)

## 2012-12-20 LAB — PACEMAKER DEVICE OBSERVATION
AL THRESHOLD: 0.625 V
BAMS-0001: 150 {beats}/min
BAMS-0003: 70 {beats}/min
BATTERY VOLTAGE: 2.95 V
RV LEAD AMPLITUDE: 7.9 mv

## 2012-12-20 NOTE — Patient Instructions (Addendum)
blood tests are being requested for you today.  You will be contacted with results.   continue insulin, 3x a day (just before each meal) 250-225-250-150 units.   Please come back for a follow-up appointment in 3-4 months.   check your blood sugar twice a day.  vary the time of day when you check, between before the 3 meals, and at bedtime.  also check if you have symptoms of your blood sugar being too high or too low.  please keep a record of the readings and bring it to your next appointment here.  please call us sooner if your blood sugar goes below 70, or if you have a lot of readings over 200.

## 2012-12-20 NOTE — Progress Notes (Signed)
Subjective:    Patient ID: [name], male    DOB: 01-31-1941, 72 y.o.   MRN: [id number]  HPI Pt return for f/u of insulin-requiring DM (dx'ed 1988; complicated by renal insufficiency, peripheral sensory neuropathy, and CAD; characterized by severe insulin resistance).  he brings a record of his cbg's which i have reviewed today.  It varies from 68-180.  There is no trend throughout the day.  He increased the insulin 3 weeks ago to (just before each meal) 225-200-225-250 units.  denies hypoglycemia Hyperkalemia: he denies muscle weakness. He has severe glaucoma, and wants to know if the florinef can be minimized. Past Medical History  Diagnosis Date  . Diabetes mellitus   . Hypertension   . COPD (chronic obstructive pulmonary disease)     CPAP  . Renal insufficiency   . Hyperlipemia   . Anemia   . Other and unspecified coagulation defects   . Hypothyroidism   . CAD (coronary artery disease)   . Hyperkalemia   . Osteoarthritis   . Glaucoma(365)   . Sick sinus syndrome     s/p PPM by JA  . Pancreatitis   . Obesity   . Constipation   . OSA on CPAP     using CPAP although it is hard for him to tolerate  . SOB (shortness of breath)     Past Surgical History  Procedure Date  . Nasal septum surgery 1967  . Pacemaker placement 06/2010    [facility] Accent RF DR, Model [phone] ( Serial number [id number])  . Thromboembolectomy and four compartment fasciotomy 2009  . Cholecystectomy     History   Social History  . Marital Status: Married    Spouse Name: N/A    Number of Children: N/A  . 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 Smoking status: Former Smoker -- 2.0 packs/day for 50 years    Types: Cigarettes    Quit date: 12/08/2006  . Smokeless tobacco: Never Used  . Alcohol Use: No  . Drug Use: No  . Sexually Active: Not on file   Other Topics Concern  . Not on file   Social History  Narrative  . No narrative on file    Current Outpatient Prescriptions on File Prior to Visit  Medication Sig Dispense Refill  . Ascorbic Acid (VITAMIN C) 500 MG tablet Take 500 mg by mouth daily.        Marland Kitchen aspirin 81 MG tablet Take 81 mg by mouth daily.        . B-D INS SYR ULTRAFINE 1CC/30G 30G X 1/2" 1 ML MISC USE FOR INSULIN TO TEST 4 TIMES DAILY  400 each  3  . Bimatoprost (LUMIGAN) 0.01 % SOLN Apply to eye. Once daily-one drop both eyes       . brimonidine (ALPHAGAN P) 0.1 % SOLN One drop both eyes twice daily       . Cholecalciferol (VITAMIN D3) 400 UNITS CAPS Take 1,000 Units by mouth daily.       . CRESTOR 40 MG tablet TAKE 1/2 TABLET BY MOUTH 3 TIMES WEEKLY(MONDAY,WEDNESDAY,FRIDAY)  30 tablet  3  . dextromethorphan-guaiFENesin (MUCINEX DM) 30-600 MG per 12 hr tablet Take 1 tablet by mouth as needed.       . dorzolamide (TRUSOPT) 2 % ophthalmic solution 1 drop. One drop both eyes twice daily       .  fenofibrate micronized (LOFIBRA) 200 MG capsule TAKE 1 CAPSULE EVERY DAY  30 capsule  5  . fludrocortisone (FLORINEF) 0.1 MG tablet 3 (three) times a week.       . fluticasone (VERAMYST) 27.5 MCG/SPRAY nasal spray Place 2 sprays into the nose daily as needed.      . furosemide (LASIX) 20 MG tablet Take 1 tablet (20 mg total) by mouth daily.  30 tablet  5  . glucose blood (ONE TOUCH ULTRA TEST) test strip Use as instructed four times daily dx 250.43  360 each  3  . insulin regular human CONCENTRATED (HUMULIN R) 500 UNIT/ML SOLN injection 3x a day (just before each meal) 225-225-225-150 units      . levothyroxine (SYNTHROID, LEVOTHROID) 50 MCG tablet TAKE 1 TABLET EVERY DAY  30 tablet  7  . mometasone (NASONEX) 50 MCG/ACT nasal spray        . Multiple Vitamin (MULTIVITAMIN) tablet Take 1 tablet by mouth daily.        . OMEGA-3 1000 MG CAPS Take 1 capsule by mouth daily.       Marland Kitchen omeprazole (PRILOSEC) 20 MG capsule TAKE AS DIRECTED  30 capsule  3  . PROAIR HFA 108 (90 BASE) MCG/ACT inhaler  INHALE 2 PUFFS EVERY 4 TO 6 HOURS AS NEEDED  8.5 g  8  . Probiotic Product (PROBIOTIC ACIDOPHILUS) CAPS Take by mouth.        . warfarin (COUMADIN) 4 MG tablet Take as directed by Anticoagulation clinic  60 tablet  2  . ZENPEP 25000 UNITS CPEP TAKE 2 CAPSULES BY MOUTH 3 TIMES A DAY WITH MEALS  180 capsule  4    Allergies  Allergen Reactions  . Pioglitazone     ACTOS  REACTION: Edema    Family History  Problem Relation Age of Onset  . Liver cancer Mother     deceased age 74  . Cancer Mother     liver cancer  . Colon cancer Neg Hx   . Heart attack Father     BP 136/80  Pulse 76  Temp 98.5 F (36.9 C) (Oral)  Wt 323 lb (146.512 kg)  SpO2 95%  Review of Systems Denies weight change and cramps    Objective:   Physical Exam VITAL SIGNS:  See vs page GENERAL: no distress SKIN:  Insulin injection sites at the anterior abdomen are normal.  Lab Results  Component Value Date   HGBA1C 9.0* 12/20/2012      Assessment & Plan:  DM: needs increased rx Hyperkalemia, well-controlled Glaucoma.  This could be exac by florinef

## 2012-12-21 ENCOUNTER — Ambulatory Visit (INDEPENDENT_AMBULATORY_CARE_PROVIDER_SITE_OTHER): Payer: Medicare Other | Admitting: *Deleted

## 2012-12-21 DIAGNOSIS — Z23 Encounter for immunization: Secondary | ICD-10-CM

## 2012-12-26 ENCOUNTER — Telehealth: Payer: Self-pay | Admitting: Endocrinology

## 2012-12-26 NOTE — Telephone Encounter (Signed)
please call patient: Potassium is good Please reduce fludrocortisone to 1 pill, twice a week

## 2012-12-27 NOTE — Telephone Encounter (Signed)
Pt advised and states an understanding of results 

## 2012-12-29 ENCOUNTER — Ambulatory Visit (INDEPENDENT_AMBULATORY_CARE_PROVIDER_SITE_OTHER): Payer: Medicare Other | Admitting: General Practice

## 2012-12-29 DIAGNOSIS — I743 Embolism and thrombosis of arteries of the lower extremities: Secondary | ICD-10-CM

## 2012-12-29 NOTE — Patient Instructions (Addendum)
1/29 - Take 8 mg of coumadin 1/30 - Take 8 mg of coumadin 1/31 - Take 6 mg of coumadin 2/1 - Take 6 mg of coumadin 2/2 - Take 6 mg of coumadin 2/2 - Take 6 mg of coumadin 2/3 - Take 8 mg of coumadin 2/4 - Take 6 mg of coumadin 2/5 - Re-check in coumadin clinic.

## 2012-12-31 DIAGNOSIS — G4733 Obstructive sleep apnea (adult) (pediatric): Secondary | ICD-10-CM | POA: Insufficient documentation

## 2012-12-31 DIAGNOSIS — Z950 Presence of cardiac pacemaker: Secondary | ICD-10-CM | POA: Insufficient documentation

## 2012-12-31 DIAGNOSIS — Z961 Presence of intraocular lens: Secondary | ICD-10-CM | POA: Insufficient documentation

## 2013-01-03 ENCOUNTER — Telehealth: Payer: Self-pay | Admitting: General Practice

## 2013-01-03 NOTE — Telephone Encounter (Signed)
Message copied by Garrison Columbus on Mon Jan 03, 2013  8:19 AM ------      Message from: Hillis Range      Created: Sun Jan 02, 2013  4:35 PM       Do not need to bridge.  Resume coumadin after surgery.            ----- Message -----         From: Garrison Columbus, RN         Sent: 12/29/2012   3:38 PM           To: Hillis Range, MD            This patient is having eye surgery on 1/28.  Says that the eye doctor stopped coumadin on 1/21. INR 1.8 today. Should pt be bridged with Lovenox?  Please advise.            Bailey Mech, RN      Coumadin Clinic      [place] [facility]

## 2013-01-12 ENCOUNTER — Ambulatory Visit (INDEPENDENT_AMBULATORY_CARE_PROVIDER_SITE_OTHER): Payer: Medicare Other | Admitting: General Practice

## 2013-01-12 DIAGNOSIS — I743 Embolism and thrombosis of arteries of the lower extremities: Secondary | ICD-10-CM

## 2013-01-24 NOTE — Progress Notes (Signed)
Pacer check in clinic  

## 2013-01-25 ENCOUNTER — Other Ambulatory Visit: Payer: Self-pay | Admitting: Neurology

## 2013-01-25 MED ORDER — ROSUVASTATIN CALCIUM 40 MG PO TABS: ORAL_TABLET | ORAL | Status: DC

## 2013-02-02 ENCOUNTER — Ambulatory Visit (INDEPENDENT_AMBULATORY_CARE_PROVIDER_SITE_OTHER): Payer: Medicare Other | Admitting: General Practice

## 2013-02-02 ENCOUNTER — Other Ambulatory Visit: Payer: Self-pay | Admitting: General Practice

## 2013-02-02 DIAGNOSIS — I743 Embolism and thrombosis of arteries of the lower extremities: Secondary | ICD-10-CM

## 2013-02-02 LAB — POCT INR: INR: 2.2

## 2013-02-02 MED ORDER — WARFARIN SODIUM 4 MG PO TABS: ORAL_TABLET | ORAL | Status: DC

## 2013-03-02 ENCOUNTER — Ambulatory Visit (INDEPENDENT_AMBULATORY_CARE_PROVIDER_SITE_OTHER): Payer: Medicare Other | Admitting: General Practice

## 2013-03-02 DIAGNOSIS — I743 Embolism and thrombosis of arteries of the lower extremities: Secondary | ICD-10-CM

## 2013-03-28 ENCOUNTER — Ambulatory Visit (INDEPENDENT_AMBULATORY_CARE_PROVIDER_SITE_OTHER): Payer: Medicare Other | Admitting: Endocrinology

## 2013-03-28 VITALS — BP 132/72 | HR 73 | Wt 326.0 lb

## 2013-03-28 DIAGNOSIS — E1065 Type 1 diabetes mellitus with hyperglycemia: Secondary | ICD-10-CM

## 2013-03-28 DIAGNOSIS — E1029 Type 1 diabetes mellitus with other diabetic kidney complication: Secondary | ICD-10-CM

## 2013-03-28 NOTE — Progress Notes (Signed)
Subjective:    Patient ID: [name], male    DOB: May 26, 1941, 72 y.o.   MRN: [id number]  HPI Pt return for f/u of insulin-requiring DM (dx'ed 1988; complicated by renal insufficiency, peripheral sensory neuropathy, and CAD; characterized by severe insulin resistance; he has never had severe hypoglycemia or DKA).  he brings a record of his cbg's which i have reviewed today.  It varies from 68-180.  He has mild hypoglycemia approx once every few months, usually in the middle of the night.   Past Medical History  Diagnosis Date  . Diabetes mellitus   . Hypertension   . COPD (chronic obstructive pulmonary disease)     CPAP  . Renal insufficiency   . Hyperlipemia   . Anemia   . Other and unspecified coagulation defects   . Hypothyroidism   . CAD (coronary artery disease)   . Hyperkalemia   . Osteoarthritis   . Glaucoma(365)   . Sick sinus syndrome     s/p PPM by JA  . Pancreatitis   . Obesity   . Constipation   . OSA on CPAP     using CPAP although it is hard for him to tolerate  . SOB (shortness of breath)     Past Surgical History  Procedure Laterality Date  . Nasal septum surgery  1967  . Pacemaker placement  06/2010    [facility] [employer] RF DR, Model [id number] ( Serial number [id number])  . Thromboembolectomy and four compartment fasciotomy  2009  . Cholecystectomy      History   Social History  . Marital Status: Married    Spouse Name: N/A    Number of Children: N/A  . 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 Smoking status: Former Smoker -- 2.00 packs/day for 50 years    Types: Cigarettes    Quit date: 12/08/2006  . Smokeless tobacco: Never Used  . Alcohol Use: No  . Drug Use: No  . Sexually Active: Not on file   Other Topics Concern  . Not on file   Social History Narrative  . No narrative on file    Current Outpatient Prescriptions on File Prior to Visit  Medication Sig  Dispense Refill  . Ascorbic Acid (VITAMIN C) 500 MG tablet Take 500 mg by mouth daily.        Marland Kitchen aspirin 81 MG tablet Take 81 mg by mouth daily.        . B-D INS SYR ULTRAFINE 1CC/30G 30G X 1/2" 1 ML MISC USE FOR INSULIN TO TEST 4 TIMES DAILY  400 each  3  . Bimatoprost (LUMIGAN) 0.01 % SOLN Apply to eye. Once daily-one drop both eyes       . brimonidine (ALPHAGAN P) 0.1 % SOLN One drop both eyes twice daily       . Cholecalciferol (VITAMIN D3) 400 UNITS CAPS Take 1,000 Units by mouth daily.       . fenofibrate micronized (LOFIBRA) 200 MG capsule TAKE 1 CAPSULE EVERY DAY  30 capsule  5  . fludrocortisone (FLORINEF) 0.1 MG tablet 2 (two) times a week.       . fluticasone (VERAMYST) 27.5 MCG/SPRAY nasal spray Place 2 sprays into the nose daily as needed.      . furosemide (LASIX) 20 MG tablet Take 1 tablet (20 mg total) by mouth daily.  30 tablet  5  . glucose blood (ONE TOUCH ULTRA TEST) test strip Use as instructed four times daily dx 250.43  360 each  3  . insulin regular human CONCENTRATED (HUMULIN R) 500 UNIT/ML SOLN injection 3x a day (just before each meal) 275-225-275-125.      . levothyroxine (SYNTHROID, LEVOTHROID) 50 MCG tablet TAKE 1 TABLET EVERY DAY  30 tablet  7  . Multiple Vitamin (MULTIVITAMIN) tablet Take 1 tablet by mouth daily.        . OMEGA-3 1000 MG CAPS Take 1 capsule by mouth daily.       Marland Kitchen omeprazole (PRILOSEC) 20 MG capsule TAKE AS DIRECTED  30 capsule  3  . Probiotic Product (PROBIOTIC ACIDOPHILUS) CAPS Take by mouth.        . rosuvastatin (CRESTOR) 40 MG tablet TAKE 1/2 TABLET BY MOUTH 3 TIMES WEEKLY(MONDAY,WEDNESDAY,FRIDAY)  30 tablet  3  . warfarin (COUMADIN) 4 MG tablet Take as directed by Anticoagulation clinic  60 tablet  2  . ZENPEP 25000 UNITS CPEP TAKE 2 CAPSULES BY MOUTH 3 TIMES A DAY WITH MEALS  180 capsule  4  . dextromethorphan-guaiFENesin (MUCINEX DM) 30-600 MG per 12 hr tablet Take 1 tablet by mouth as needed.       . dorzolamide (TRUSOPT) 2 % ophthalmic  solution 1 drop. One drop both eyes twice daily       . mometasone (NASONEX) 50 MCG/ACT nasal spray         No current facility-administered medications on file prior to visit.    Allergies  Allergen Reactions  . Pioglitazone     ACTOS  REACTION: Edema    Family History  Problem Relation Age of Onset  . Liver cancer Mother     deceased age 79  . Cancer Mother     liver cancer  . Colon cancer Neg Hx   . Heart attack Father     BP 132/72  Pulse 73  Wt 326 lb (147.873 kg)  BMI 46.78 kg/m2  SpO2 95%  Review of Systems Denies LOC    Objective:   Physical Exam VITAL SIGNS:  See vs page GENERAL: no distress Pulses: dorsalis pedis intact bilat.  no carotid bruit.  EXTEMITIES: no deformity.  no ulcer on the feet.  feet are of normal color and temp.  1+ bilat leg edema.  Healed surgical scars at the right leg.  There is bilateral onychomycosis.  There is very dry skin and hyperpigmentation of the legs.   Neuro: sensation is intact to touch on the feet, but decreased from normal.   Lab Results  Component Value Date   HGBA1C 8.5* 03/28/2013      Assessment & Plan:  DM: needs increased rx

## 2013-03-28 NOTE — Patient Instructions (Addendum)
blood tests are being requested for you today.  You will be contacted with results.   continue insulin, 4x a day (just before each meal) 275-225-275-125 units (the 125 is if you eat a snack at bedtime).    Please come back for a regular physical appointment in 3 months.   check your blood sugar twice a day.  vary the time of day when you check, between before the 3 meals, and at bedtime.  also check if you have symptoms of your blood sugar being too high or too low.  please keep a record of the readings and bring it to your next appointment here.  please call us sooner if your blood sugar goes below 70, or if you have a lot of readings over 200.

## 2013-03-30 ENCOUNTER — Ambulatory Visit (INDEPENDENT_AMBULATORY_CARE_PROVIDER_SITE_OTHER): Payer: Medicare Other | Admitting: General Practice

## 2013-03-30 DIAGNOSIS — I743 Embolism and thrombosis of arteries of the lower extremities: Secondary | ICD-10-CM

## 2013-04-05 ENCOUNTER — Other Ambulatory Visit: Payer: Self-pay | Admitting: *Deleted

## 2013-04-05 MED ORDER — OMEPRAZOLE 20 MG PO CPDR: 20.0000 mg | DELAYED_RELEASE_CAPSULE | Freq: Every day | ORAL | Status: DC

## 2013-04-18 ENCOUNTER — Other Ambulatory Visit: Payer: Self-pay | Admitting: Gastroenterology

## 2013-04-18 NOTE — Telephone Encounter (Signed)
PATIENT NEEDS OFFICE VISIT FOR FURTHER REFILLS  

## 2013-04-27 ENCOUNTER — Ambulatory Visit (INDEPENDENT_AMBULATORY_CARE_PROVIDER_SITE_OTHER): Payer: Medicare Other | Admitting: General Practice

## 2013-04-27 DIAGNOSIS — I743 Embolism and thrombosis of arteries of the lower extremities: Secondary | ICD-10-CM

## 2013-05-09 ENCOUNTER — Other Ambulatory Visit: Payer: Self-pay | Admitting: *Deleted

## 2013-05-09 MED ORDER — INSULIN REGULAR HUMAN (CONC) 500 UNIT/ML ~~LOC~~ SOLN: SUBCUTANEOUS | Status: DC

## 2013-05-25 ENCOUNTER — Ambulatory Visit (INDEPENDENT_AMBULATORY_CARE_PROVIDER_SITE_OTHER): Payer: Medicare Other | Admitting: General Practice

## 2013-05-25 DIAGNOSIS — I743 Embolism and thrombosis of arteries of the lower extremities: Secondary | ICD-10-CM

## 2013-06-08 ENCOUNTER — Other Ambulatory Visit: Payer: Self-pay | Admitting: Endocrinology

## 2013-06-08 ENCOUNTER — Other Ambulatory Visit: Payer: Self-pay | Admitting: *Deleted

## 2013-06-08 MED ORDER — WARFARIN SODIUM 4 MG PO TABS: ORAL_TABLET | ORAL | Status: DC

## 2013-06-20 ENCOUNTER — Other Ambulatory Visit: Payer: Self-pay | Admitting: *Deleted

## 2013-06-20 MED ORDER — FLUDROCORTISONE ACETATE 0.1 MG PO TABS: 0.1000 mg | ORAL_TABLET | Freq: Every day | ORAL | Status: DC

## 2013-06-27 ENCOUNTER — Encounter: Payer: Self-pay | Admitting: Endocrinology

## 2013-06-27 ENCOUNTER — Ambulatory Visit (INDEPENDENT_AMBULATORY_CARE_PROVIDER_SITE_OTHER): Payer: Medicare Other | Admitting: Endocrinology

## 2013-06-27 VITALS — BP 132/80 | HR 82 | Ht 70.0 in | Wt 323.0 lb

## 2013-06-27 DIAGNOSIS — N259 Disorder resulting from impaired renal tubular function, unspecified: Secondary | ICD-10-CM

## 2013-06-27 DIAGNOSIS — E875 Hyperkalemia: Secondary | ICD-10-CM

## 2013-06-27 DIAGNOSIS — D509 Iron deficiency anemia, unspecified: Secondary | ICD-10-CM

## 2013-06-27 DIAGNOSIS — E1065 Type 1 diabetes mellitus with hyperglycemia: Secondary | ICD-10-CM

## 2013-06-27 DIAGNOSIS — I5032 Chronic diastolic (congestive) heart failure: Secondary | ICD-10-CM

## 2013-06-27 DIAGNOSIS — Z125 Encounter for screening for malignant neoplasm of prostate: Secondary | ICD-10-CM

## 2013-06-27 DIAGNOSIS — E1029 Type 1 diabetes mellitus with other diabetic kidney complication: Secondary | ICD-10-CM

## 2013-06-27 DIAGNOSIS — Z79899 Other long term (current) drug therapy: Secondary | ICD-10-CM

## 2013-06-27 DIAGNOSIS — R0602 Shortness of breath: Secondary | ICD-10-CM

## 2013-06-27 DIAGNOSIS — I4891 Unspecified atrial fibrillation: Secondary | ICD-10-CM

## 2013-06-27 DIAGNOSIS — E7800 Pure hypercholesterolemia, unspecified: Secondary | ICD-10-CM

## 2013-06-27 LAB — CBC WITH DIFFERENTIAL/PLATELET
Basophils Absolute: 0 10*3/uL (ref 0.0–0.1)
Eosinophils Absolute: 0.2 10*3/uL (ref 0.0–0.7)
HCT: 44.8 % (ref 39.0–52.0)
Lymphs Abs: 3.1 10*3/uL (ref 0.7–4.0)
MCHC: 33.4 g/dL (ref 30.0–36.0)
MCV: 96.5 fl (ref 78.0–100.0)
Monocytes Absolute: 0.6 10*3/uL (ref 0.1–1.0)
Platelets: 239 10*3/uL (ref 150.0–400.0)
RDW: 15.1 % — ABNORMAL HIGH (ref 11.5–14.6)

## 2013-06-27 LAB — BASIC METABOLIC PANEL
CO2: 27 mEq/L (ref 19–32)
Calcium: 9.1 mg/dL (ref 8.4–10.5)
Creatinine, Ser: 1.1 mg/dL (ref 0.4–1.5)

## 2013-06-27 LAB — LIPID PANEL
Cholesterol: 241 mg/dL — ABNORMAL HIGH (ref 0–200)
Total CHOL/HDL Ratio: 6
VLDL: 110.2 mg/dL — ABNORMAL HIGH (ref 0.0–40.0)

## 2013-06-27 LAB — LDL CHOLESTEROL, DIRECT: Direct LDL: 102.6 mg/dL

## 2013-06-27 LAB — HEPATIC FUNCTION PANEL
ALT: 44 U/L (ref 0–53)
AST: 43 U/L — ABNORMAL HIGH (ref 0–37)
Albumin: 3.6 g/dL (ref 3.5–5.2)

## 2013-06-27 LAB — BRAIN NATRIURETIC PEPTIDE: Pro B Natriuretic peptide (BNP): 76 pg/mL (ref 0.0–100.0)

## 2013-06-27 LAB — MICROALBUMIN / CREATININE URINE RATIO: Microalb Creat Ratio: 224.3 mg/g — ABNORMAL HIGH (ref 0.0–30.0)

## 2013-06-27 LAB — PSA: PSA: 0.62 ng/mL (ref 0.10–4.00)

## 2013-06-27 LAB — IBC PANEL: Iron: 74 ug/dL (ref 42–165)

## 2013-06-27 LAB — TSH: TSH: 4.2 u[IU]/mL (ref 0.35–5.50)

## 2013-06-27 NOTE — Progress Notes (Signed)
Subjective:    Patient ID: [name], male    DOB: Jan 21, 1941, 72 y.o.   MRN: [id number]  HPI Pt is here for regular wellness examination, and is feeling pretty well in general, and says chronic med probs are stable, except as noted below Past Medical History  Diagnosis Date  . Diabetes mellitus   . Hypertension   . COPD (chronic obstructive pulmonary disease)     CPAP  . Renal insufficiency   . Hyperlipemia   . Anemia   . Other and unspecified coagulation defects   . Hypothyroidism   . CAD (coronary artery disease)   . Hyperkalemia   . Osteoarthritis   . Glaucoma   . Sick sinus syndrome     s/p PPM by JA  . Pancreatitis   . Obesity   . Constipation   . OSA on CPAP     using CPAP although it is hard for him to tolerate  . SOB (shortness of breath)     Past Surgical History  Procedure Laterality Date  . Nasal septum surgery  1967  . Pacemaker placement  06/2010    [facility] Acc[employer] RF DR, Model [id number] ( Serial number [id number])  . Thromboembolectomy and four compartment fasciotomy  2009  . Cholecystectomy      History   Social History  . Marital Status: Married    Spouse Name: N/A    Number of Children: N/A  . 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 Smoking status: Former Smoker -- 2.00 packs/day for 50 years    Types: Cigarettes    Quit date: 12/08/2006  . Smokeless tobacco: Never Used  . Alcohol Use: No  . Drug Use: No  . Sexually Active: Not on file   Other Topics Concern  . Not on file   Social History Narrative  . No narrative on file    Current Outpatient Prescriptions on File Prior to Visit  Medication Sig Dispense Refill  . Ascorbic Acid (VITAMIN C) 500 MG tablet Take 500 mg by mouth daily.        Marland Kitchen aspirin 81 MG tablet Take 81 mg by mouth daily.        . B-D INS SYR ULTRAFINE 1CC/30G 30G X 1/2" 1 ML MISC USE FOR INSULIN TO TEST 4 TIMES DAILY  400 each  3  .  Bimatoprost (LUMIGAN) 0.01 % SOLN Apply to eye. Once daily-one drop both eyes       . brimonidine (ALPHAGAN P) 0.1 % SOLN One drop both eyes twice daily       . Cholecalciferol (VITAMIN D3) 400 UNITS CAPS Take 1,000 Units by mouth daily.       Marland Kitchen dextromethorphan-guaiFENesin (MUCINEX DM) 30-600 MG per 12 hr tablet Take 1 tablet by mouth as needed.       . dorzolamide (TRUSOPT) 2 % ophthalmic solution 1 drop. One drop both eyes twice daily       . dorzolamide-timolol (COSOPT) 22.3-6.8 MG/ML ophthalmic solution       . fenofibrate micronized (LOFIBRA) 200 MG capsule TAKE 1 CAPSULE EVERY DAY  30 capsule  5  . fludrocortisone (FLORINEF) 0.1 MG tablet Take 1 tablet (0.1 mg total) by mouth daily.  30 tablet  3  . fluticasone (FLONASE) 50 MCG/ACT nasal spray       . fluticasone (VERAMYST) 27.5 MCG/SPRAY nasal spray Place 2  sprays into the nose daily as needed.      . furosemide (LASIX) 20 MG tablet Take 1 tablet (20 mg total) by mouth daily.  30 tablet  5  . glucose blood (ONE TOUCH ULTRA TEST) test strip Use as instructed four times daily dx 250.43  360 each  3  . insulin regular human CONCENTRATED (HUMULIN R) 500 UNIT/ML SOLN injection 4x a day (just before each meal) 275-225-275-125.  60 mL  3  . latanoprost (XALATAN) 0.005 % ophthalmic solution       . levothyroxine (SYNTHROID, LEVOTHROID) 50 MCG tablet TAKE 1 TABLET EVERY DAY  30 tablet  7  . Multiple Vitamin (MULTIVITAMIN) tablet Take 1 tablet by mouth daily.        . OMEGA-3 1000 MG CAPS Take 1 capsule by mouth daily.       Marland Kitchen omeprazole (PRILOSEC) 20 MG capsule Take 1 capsule (20 mg total) by mouth daily.  30 capsule  4  . Pancrelipase, Lip-Prot-Amyl, (ZENPEP) 25000 UNITS CPEP TAKE 2 CAPSULES BY MOUTH 3 TIMES A DAY WITH MEALS  180 capsule  1  . Probiotic Product (PROBIOTIC ACIDOPHILUS) CAPS Take by mouth.        . rosuvastatin (CRESTOR) 40 MG tablet TAKE 1/2 TABLET BY MOUTH 3 TIMES WEEKLY(MONDAY,WEDNESDAY,FRIDAY)  30 tablet  3  . warfarin  (COUMADIN) 4 MG tablet Take as directed by Anticoagulation clinic  50 tablet  2  . mometasone (NASONEX) 50 MCG/ACT nasal spray         No current facility-administered medications on file prior to visit.    Allergies  Allergen Reactions  . Pioglitazone     ACTOS  REACTION: Edema    Family History  Problem Relation Age of Onset  . Liver cancer Mother     deceased age 61  . Cancer Mother     liver cancer  . Colon cancer Neg Hx   . Heart attack Father     BP 132/80  Pulse 82  Ht 5\' 10"  (1.778 m)  Wt 323 lb (146.512 kg)  BMI 46.35 kg/m2  SpO2 96%     Review of Systems  Constitutional: Negative for fever.  HENT:       No change in chronic hearing loss  Eyes: Negative for visual disturbance.  Respiratory:       No change in chronic doe  Cardiovascular: Negative for chest pain.  Gastrointestinal: Negative for anal bleeding.  Endocrine: Negative for cold intolerance.  Genitourinary: Negative for hematuria and difficulty urinating.  Musculoskeletal: Positive for back pain.  Skin: Negative for rash.  Allergic/Immunologic: Positive for environmental allergies.  Neurological: Positive for numbness.  Hematological: Bruises/bleeds easily.  Psychiatric/Behavioral: Negative for dysphoric mood.       Objective:   Physical Exam VS: see vs page GEN: no distress HEAD: head: no deformity eyes: no periorbital swelling, no proptosis external nose and ears are normal mouth: no lesion seen NECK: supple, thyroid is not enlarged CHEST WALL: no deformity LUNGS: clear to auscultation BREASTS:  No gynecomastia CV: reg rate and rhythm, no murmur ABD: abdomen is soft, nontender.  no hepatosplenomegaly.  not distended.  no hernia GENITALIA/RECTAL/PROSTATE:  declined MUSCULOSKELETAL: muscle bulk and strength are grossly normal.  no obvious joint swelling.  gait is normal and steady. PULSES: no carotid bruit NEURO:  cn 2-12 grossly intact.   readily moves all 4's.   SKIN:  Normal  texture and temperature.  No rash or suspicious lesion is visible.   NODES:  None palpable at the neck PSYCH: alert, oriented x3.  Does not appear anxious nor depressed.      Assessment & Plan:  Wellness visit today, with problems stable, except as noted.  we discussed code status.  pt requests full code, but would not want to be started or maintained on artificial life-support measures if there was not a reasonable chance of recovery.   SEPARATE EVALUATION FOLLOWS--EACH PROBLEM HERE IS NEW, NOT RESPONDING TO TREATMENT, OR POSES SIGNIFICANT RISK TO THE PATIENT'S HEALTH: HISTORY OF THE PRESENT ILLNESS: Pt return for f/u of insulin-requiring DM (dx'ed 1988; he has mild if any neuropathy of the lower extremities, and associated renal insufficiency and CAD; characterized by severe insulin resistance; he has never had severe hypoglycemia or DKA).  he brings a record of his cbg's which i have reviewed today.  It varies from 58-180.  He has mild hypoglycemia approx once a week, usually before breakfast.    PAST MEDICAL HISTORY reviewed and up to date today REVIEW OF SYSTEMS: Denies loc and weight change PHYSICAL EXAMINATION: VITAL SIGNS:  See vs page GENERAL: no distress IMPRESSION: DM: ? Overcontrolled.  This insulin regimen was chosen from multiple options, as it best matches his insulin to his changing requirements throughout the day.  The benefits of glycemic control must be weighed against the risks of hypoglycemia.   fe-deficicieny anemia, ? Better Renal insuff, ? better PLAN: See instruction page

## 2013-06-27 NOTE — Patient Instructions (Addendum)
please consider these measures for your health:  minimize alcohol.  do not use tobacco products.  have a colonoscopy at least every 10 years from age 70.  keep firearms safely stored.  always use seat belts.  have working smoke alarms in your home.  see an eye doctor and dentist regularly.  never drive under the influence of alcohol or drugs (including prescription drugs).  those with fair skin should take precautions against the sun. it is critically important to prevent falling down (keep floor areas well-lit, dry, and free of loose objects.  If you have a cane, walker, or wheelchair, you should use it, even for short trips around the house.  Also, try not to rush). blood tests are being requested for you today.  We'll contact you with results. Please come back for a follow-up appointment in 3 months 

## 2013-06-28 ENCOUNTER — Other Ambulatory Visit: Payer: Self-pay | Admitting: Endocrinology

## 2013-06-28 LAB — URINALYSIS, ROUTINE W REFLEX MICROSCOPIC
Bilirubin Urine: NEGATIVE
Leukocytes, UA: NEGATIVE
Nitrite: NEGATIVE
Total Protein, Urine: 100
Urobilinogen, UA: 1 (ref 0.0–1.0)

## 2013-06-28 MED ORDER — INSULIN REGULAR HUMAN (CONC) 500 UNIT/ML ~~LOC~~ SOLN: SUBCUTANEOUS | Status: DC

## 2013-06-28 MED ORDER — ROSUVASTATIN CALCIUM 40 MG PO TABS: ORAL_TABLET | ORAL | Status: DC

## 2013-07-06 ENCOUNTER — Ambulatory Visit (INDEPENDENT_AMBULATORY_CARE_PROVIDER_SITE_OTHER): Payer: Medicare Other | Admitting: General Practice

## 2013-07-06 DIAGNOSIS — I743 Embolism and thrombosis of arteries of the lower extremities: Secondary | ICD-10-CM

## 2013-07-07 ENCOUNTER — Ambulatory Visit (INDEPENDENT_AMBULATORY_CARE_PROVIDER_SITE_OTHER): Payer: Medicare Other | Admitting: Internal Medicine

## 2013-07-07 ENCOUNTER — Encounter: Payer: Self-pay | Admitting: Internal Medicine

## 2013-07-07 VITALS — BP 150/66 | HR 60 | Ht 70.0 in | Wt 324.0 lb

## 2013-07-07 DIAGNOSIS — I872 Venous insufficiency (chronic) (peripheral): Secondary | ICD-10-CM

## 2013-07-07 DIAGNOSIS — R42 Dizziness and giddiness: Secondary | ICD-10-CM

## 2013-07-07 DIAGNOSIS — G4733 Obstructive sleep apnea (adult) (pediatric): Secondary | ICD-10-CM

## 2013-07-07 DIAGNOSIS — I4891 Unspecified atrial fibrillation: Secondary | ICD-10-CM

## 2013-07-07 DIAGNOSIS — I498 Other specified cardiac arrhythmias: Secondary | ICD-10-CM

## 2013-07-07 DIAGNOSIS — R609 Edema, unspecified: Secondary | ICD-10-CM

## 2013-07-07 LAB — PACEMAKER DEVICE OBSERVATION
AL IMPEDENCE PM: 400 Ohm
ATRIAL PACING PM: 96
BAMS-0001: 150 {beats}/min
BATTERY VOLTAGE: 2.9629 V
DEVICE MODEL PM: 7152830
RV LEAD AMPLITUDE: 8.8 mv
RV LEAD IMPEDENCE PM: 337.5 Ohm
VENTRICULAR PACING PM: 1

## 2013-07-07 NOTE — Progress Notes (Signed)
PCP: Romero Belling, MD  The patient presents today for routine electrophysiology followup.  Since last being seen in our clinic, the patient reports doing reasonably well.  He has stable edema and mild SOB.  He reports feeling "tired and washed out"  Though this was his complaint last year also.  He reports compliance with CPAP.   He reports associated mild dizziness but denies presyncoe or syncope.  Today, he denies symptoms of palpitations, chest pain, orthopnea, PND, or neurologic sequela.  The patient feels that he is tolerating medications without difficulties and is otherwise without complaint today.   Past Medical History  Diagnosis Date  . Diabetes mellitus   . Hypertension   . COPD (chronic obstructive pulmonary disease)     CPAP  . Renal insufficiency   . Hyperlipemia   . Anemia   . Other and unspecified coagulation defects   . Hypothyroidism   . CAD (coronary artery disease)   . Hyperkalemia   . Osteoarthritis   . Glaucoma   . Sick sinus syndrome     s/p PPM by JA  . Pancreatitis   . Obesity   . Constipation   . OSA on CPAP     using CPAP although it is hard for him to tolerate  . SOB (shortness of breath)    Past Surgical History  Procedure Laterality Date  . Nasal septum surgery  1967  . Pacemaker placement  06/2010    [facility] [employer] RF DR, Model [id number] ( Serial number [id number])  . Thromboembolectomy and four compartment fasciotomy  2009  . Cholecystectomy      Current Outpatient Prescriptions  Medication Sig Dispense Refill  . Ascorbic Acid (VITAMIN C) 500 MG tablet Take 500 mg by mouth daily.        . B-D INS SYR ULTRAFINE 1CC/30G 30G X 1/2" 1 ML MISC USE FOR INSULIN TO TEST 4 TIMES DAILY  400 each  3  . brimonidine (ALPHAGAN P) 0.1 % SOLN ONE DROP 3 TIMES A DAY LEFT EYE ONLY      . Cholecalciferol (VITAMIN D3) 400 UNITS CAPS Take 1,000 Units by mouth daily.       Marland Kitchen dextromethorphan-guaiFENesin (MUCINEX DM) 30-600 MG per 12 hr tablet Take 1 tablet by  mouth as needed.       . dorzolamide-timolol (COSOPT) 22.3-6.8 MG/ML ophthalmic solution ONE DROP 2 TIMES A DAY ON LEFT EYE      . fenofibrate micronized (LOFIBRA) 200 MG capsule TAKE 1 CAPSULE EVERY DAY  30 capsule  5  . fluticasone (FLONASE) 50 MCG/ACT nasal spray USE TWO SPRAY AS NEEDED      . glucose blood (ONE TOUCH ULTRA TEST) test strip Use as instructed four times daily dx 250.43  360 each  3  . insulin regular human CONCENTRATED (HUMULIN R) 500 UNIT/ML SOLN injection 4x a day (just before each meal) 300-250-300-125 units.  60 mL  3  . latanoprost (XALATAN) 0.005 % ophthalmic solution ONE DROP A DAY ON LEFT EYE      . levothyroxine (SYNTHROID, LEVOTHROID) 50 MCG tablet TAKE 1 TABLET EVERY DAY  30 tablet  7  . OMEGA-3 1000 MG CAPS Take 1 capsule by mouth daily.       Marland Kitchen omeprazole (PRILOSEC) 20 MG capsule Take 1 capsule (20 mg total) by mouth daily.  30 capsule  4  . Pancrelipase, Lip-Prot-Amyl, 25000 UNITS CPEP       . Probiotic Product (PROBIOTIC ACIDOPHILUS) CAPS Take by mouth.        Marland Kitchen  rosuvastatin (CRESTOR) 40 MG tablet 1/2 tab daily  30 tablet  3  . warfarin (COUMADIN) 4 MG tablet Take as directed by Anticoagulation clinic  50 tablet  2  . mometasone (NASONEX) 50 MCG/ACT nasal spray         No current facility-administered medications for this visit.    Allergies  Allergen Reactions  . Pioglitazone     ACTOS  REACTION: Edema    History   Social History  . Marital Status: Married    Spouse Name: N/A    Number of Children: N/A  . 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 Smoking status: Former Smoker -- 2.00 packs/day for 50 years    Types: Cigarettes    Quit date: 12/08/2006  . Smokeless tobacco: Never Used  . Alcohol Use: No  . Drug Use: No  . Sexually Active: Not on file   Other Topics Concern  . Not on file   Social History Narrative  . No narrative on file    Family History   Problem Relation Age of Onset  . Liver cancer Mother     deceased age 34  . Cancer Mother     liver cancer  . Colon cancer Neg Hx   . Heart attack Father      Physical Exam: Filed Vitals:   07/07/13 1027  BP: 150/66  Pulse: 60  Height: 5\' 10"  (1.778 m)  Weight: 324 lb (146.965 kg)  SpO2: 95%    GEN- The patient is overweight appearing, alert and oriented x 3 today.   Head- normocephalic, atraumatic Eyes-  Sclera clear, conjunctiva pink Ears- hearing intact Oropharynx- clear Neck- supple, no JVP Lungs- Clear to ausculation bilaterally, normal work of breathing Chest- pacemaker pocket is well healed Heart- Regular rate and rhythm, no murmurs, rubs or gallops, PMI not laterally displaced GI- soft, NT, ND, + BS Extremities- no clubbing, cyanosis, 1+ chronic BLE edema unchanged form prior visit MS- no significant deformity or atrophy Skin- no rash or lesion Psych- euthymic mood, full affect Neuro- strength and sensation are intact  Pacemaker interrogation- reviewed in detail today,  See PACEART report  Assessment and Plan:  1. Sick sinus syndrome Normal pacemaker function See Pace Art report No changes today  2. Postural dizziness/ venous insufficiency He remains on both florinef and lasix.  These are counteracting.  Im not certain that this is the best approach.  His edema appears to be more from venous insufficiency than volume overload.  I will therefore order support hose. Stop both florinef and lasix and reevaluate going forward.  He will return to see Norma Fredrickson in 1 month to assess volume status.  Hopefully adequate hydration would help postural dizziness and support hose will treat venous insufficiency  3. Weight loss is advised  Return to see Lawson Fiscal in 1 month Return to the device clinic in 6 months I will see in a year

## 2013-07-07 NOTE — Patient Instructions (Addendum)
Your physician recommends that you schedule a follow-up appointment in: 4 weeks with Sunday Spillers    Your physician wants you to follow-up in: 6 months with device clinic and 12 months with Dr Jacquiline Doe will receive a reminder letter in the mail two months in advance. If you don't receive a letter, please call our office to schedule the follow-up appointment.  Your physician has recommended you make the following change in your medication:  1) Stop Florinef 2) Stop Furosemide    Wear support stockings

## 2013-07-27 ENCOUNTER — Encounter: Payer: Self-pay | Admitting: Cardiology

## 2013-07-27 ENCOUNTER — Encounter: Payer: Self-pay | Admitting: Cardiovascular Disease

## 2013-07-27 ENCOUNTER — Encounter: Payer: Self-pay | Admitting: Internal Medicine

## 2013-08-03 ENCOUNTER — Ambulatory Visit (INDEPENDENT_AMBULATORY_CARE_PROVIDER_SITE_OTHER): Payer: Medicare Other | Admitting: General Practice

## 2013-08-03 DIAGNOSIS — I743 Embolism and thrombosis of arteries of the lower extremities: Secondary | ICD-10-CM

## 2013-08-06 DIAGNOSIS — H401133 Primary open-angle glaucoma, bilateral, severe stage: Secondary | ICD-10-CM | POA: Insufficient documentation

## 2013-08-09 ENCOUNTER — Ambulatory Visit (INDEPENDENT_AMBULATORY_CARE_PROVIDER_SITE_OTHER): Payer: Medicare Other | Admitting: Nurse Practitioner

## 2013-08-09 ENCOUNTER — Encounter: Payer: Self-pay | Admitting: Nurse Practitioner

## 2013-08-09 VITALS — BP 150/70 | HR 68 | Ht 70.0 in | Wt 320.0 lb

## 2013-08-09 DIAGNOSIS — I872 Venous insufficiency (chronic) (peripheral): Secondary | ICD-10-CM

## 2013-08-09 NOTE — Progress Notes (Signed)
[name] Date of Birth: Nov 19, 1941 Medical Record #[id number]  History of Present Illness: [name] is seen back today for a one month check. Seen for Dr. Johney Frame. Has DM, HTN, COPD, OSA on CPAP, CKD, HLD, anemia, hypothyroidism, CAD, OA, SSS with pacemaker in place, pancreatitis, obesity and chronic SOB.   Seen here in device clinic a month ago - noted to be on both Florinef and Lasix for postural dizziness/venous insufficiency - felt to be counteracting. Both agents were stopped. His edema appears to be more from venous insufficiency than volume overload - support stockings were advised along with adequate hydration.   Comes back today. Here alone. Doing ok. Main complaint is that of weakness and fatigue. Feels tired all the time. This is a chronic issue - has been going on for many months. Very sedentary. No chest pain. Stable dyspnea. Swelling is ok - he doesn't really note much difference. BP is good at home. Not dizzy. Remains off the lasix and Florinef. Seeing PCP and pulmonary next month. Using his CPAP - not sure how compliant he is.   Current Outpatient Prescriptions  Medication Sig Dispense Refill  . albuterol (PROVENTIL HFA;VENTOLIN HFA) 108 (90 BASE) MCG/ACT inhaler Inhale 2 puffs into the lungs every 6 (six) hours as needed for wheezing.      . Ascorbic Acid (VITAMIN C) 500 MG tablet Take 500 mg by mouth daily.        . B-D INS SYR ULTRAFINE 1CC/30G 30G X 1/2" 1 ML MISC USE FOR INSULIN TO TEST 4 TIMES DAILY  400 each  3  . brimonidine (ALPHAGAN P) 0.1 % SOLN ONE DROP 3 TIMES A DAY LEFT EYE ONLY      . Cholecalciferol (VITAMIN D3) 400 UNITS CAPS Take 1,000 Units by mouth daily.       Marland Kitchen dextromethorphan-guaiFENesin (MUCINEX DM) 30-600 MG per 12 hr tablet Take 1 tablet by mouth as needed.       . dorzolamide-timolol (COSOPT) 22.3-6.8 MG/ML ophthalmic solution ONE DROP 2 TIMES A DAY ON LEFT EYE      . fluticasone (FLONASE) 50 MCG/ACT nasal spray USE TWO SPRAY AS NEEDED      . glucose  blood (ONE TOUCH ULTRA TEST) test strip Use as instructed four times daily dx 250.43  360 each  3  . insulin regular human CONCENTRATED (HUMULIN R) 500 UNIT/ML SOLN injection 4x a day (just before each meal) 300-250-300-125 units.  60 mL  3  . latanoprost (XALATAN) 0.005 % ophthalmic solution ONE DROP A DAY ON LEFT EYE      . levothyroxine (SYNTHROID, LEVOTHROID) 50 MCG tablet TAKE 1 TABLET EVERY DAY  30 tablet  7  . Multiple Vitamin (MULTIVITAMIN) capsule Take 1 capsule by mouth daily.      . OMEGA-3 1000 MG CAPS Take 1 capsule by mouth daily.       Marland Kitchen omeprazole (PRILOSEC) 20 MG capsule Take 1 capsule (20 mg total) by mouth daily.  30 capsule  4  . Pancrelipase, Lip-Prot-Amyl, 25000 UNITS CPEP Take 25,000 Units by mouth 3 (three) times daily.       . Probiotic Product (PROBIOTIC ACIDOPHILUS) CAPS Take by mouth.        . rosuvastatin (CRESTOR) 40 MG tablet 1/2 tab daily  30 tablet  3  . warfarin (COUMADIN) 4 MG tablet Take as directed by Anticoagulation clinic  50 tablet  2  . mometasone (NASONEX) 50 MCG/ACT nasal spray Place 2 sprays into the nose daily.       Marland Kitchen  PROAIR HFA 108 (90 BASE) MCG/ACT inhaler INHALE 2 PUFFS EVERY 4 TO 6 HOURS AS NEEDED  8.5 g  8   No current facility-administered medications for this visit.    Allergies  Allergen Reactions  . Pioglitazone     ACTOS  REACTION: Edema    Past Medical History  Diagnosis Date  . Diabetes mellitus   . Hypertension   . COPD (chronic obstructive pulmonary disease)     CPAP  . Renal insufficiency   . Hyperlipemia   . Anemia   . Other and unspecified coagulation defects   . Hypothyroidism   . CAD (coronary artery disease)   . Hyperkalemia   . Osteoarthritis   . Glaucoma   . Sick sinus syndrome     s/p PPM by JA  . Pancreatitis   . Obesity   . Constipation   . OSA on CPAP     using CPAP although it is hard for him to tolerate  . SOB (shortness of breath)     Past Surgical History  Procedure Laterality Date  . Nasal  septum surgery  1967  . Pacemaker placement  06/2010    [facility] [employer] RF DR, Model [id number] ( Serial number [id number])  . Thromboembolectomy and four compartment fasciotomy  2009  . Cholecystectomy      History  Smoking status  . Former Smoker -- 2.00 packs/day for 50 years  . Types: Cigarettes  . Quit date: 12/08/2006  Smokeless tobacco  . Never Used    History  Alcohol Use No    Family History  Problem Relation Age of Onset  . Liver cancer Mother     deceased age 3  . Cancer Mother     liver cancer  . Colon cancer Neg Hx   . Heart attack Father     Review of Systems: The review of systems is per the HPI.  All other systems were reviewed and are negative.  Physical Exam: BP 150/70  Pulse 68  Ht 5\' 10"  (1.778 m)  Wt 320 lb (145.151 kg)  BMI 45.92 kg/m2 Patient is very pleasant and in no acute distress. He is morbidly obese. Not able to sit on the exam table. Looks tired and like he has not slept. Skin is warm and dry. Color is normal.  HEENT is unremarkable. Normocephalic/atraumatic. PERRL. Sclera are nonicteric. Neck is supple. No masses. No JVD. Lungs are clear. Cardiac exam shows a regular rate and rhythm. Abdomen is soft. Extremities are full but with only trace edema. Gait and ROM are intact. Using a cane. No gross neurologic deficits noted.  LABORATORY DATA:  Lab Results  Component Value Date   WBC 9.5 06/27/2013   HGB 14.9 06/27/2013   HCT 44.8 06/27/2013   PLT 239.0 06/27/2013   GLUCOSE 192* 06/27/2013   CHOL 241* 06/27/2013   TRIG 551.0* 06/27/2013   HDL 42.30 06/27/2013   LDLDIRECT 102.6 06/27/2013   LDLCALC  Value: UNABLE TO CALCULATE IF TRIGLYCERIDE OVER 400 mg/dL        Total Cholesterol/HDL:CHD Risk Coronary Heart Disease Risk Table                     Men   Women  1/2 Average Risk   3.4   3.3 05/07/2008   ALT 44 06/27/2013   AST 43* 06/27/2013   NA 139 06/27/2013   K 4.5 06/27/2013   CL 103 06/27/2013   CREATININE 1.1 06/27/2013   BUN  23 06/27/2013    CO2 27 06/27/2013   TSH 4.20 06/27/2013   PSA 0.62 06/27/2013   INR 3.4 08/03/2013   HGBA1C 9.0* 06/27/2013   MICROALBUR 170.0 Repeated and verified X2.* 06/27/2013   Echo Study Conclusions from June 2012  - Left ventricle: Systolic function was normal. The estimated ejection fraction was in the range of 60% to 65%. - Mitral valve: Calcified annulus. - Left atrium: The atrium was mildly dilated.  Myoview Impression from June 2012  Exercise Capacity: Lexiscan with no exercise.  BP Response: Normal blood pressure response.  Clinical Symptoms: No chest pain.  ECG Impression: Baseline RBBB  Comparison with Prior Nuclear Study: No images to compare   Overall Impression: Normal stress nuclear study.  Charlton Haws   ANGIOGRAPHIC DATA FROM 2003:  1. On plain fluoroscopy there was evidence of calcification of the coronary  arteries. This was at least moderate.  2. The left ventriculogram performed in the RAO projection reveals preserved  global systolic function. No segmental abnormalities or contraction  were identified. Ejection fraction was calculated at 64.5%.  3. The left main coronary artery is long. There appears to be calcium  near the ostium. There does not appear to be high grade stenosis. It  is relatively smooth.  4. The left anterior descending artery courses to the apex. In the LAO  projection, there is evidence of about 40% narrowing over a long segment  of the left anterior descending. This does not appear to be critical.  The LAD then bifurcates into a diagonal and LAD proper with about 40%  narrowing in the LAD proper. There is fairly heavy calcification at the  bifurcation.  5. There is a circumflex which provides an intermittent distribution marginal  branch that is free of critical disease. The AV circumflex has mild  luminal irregularity but is free of significant disease.  6. The right coronary artery is a calcified vessel near the ostium. There  is about 30%  narrowing eccentrically proximally with slight haziness which  is probably due to eccentric plaquing and calcification. There is 30-40%  narrowing more distally. The PDA and posterior lateral branch are  without critical disease.   CONCLUSIONS:  1. Preserved left ventricular function.  2. Scattered coronary abnormalities as described above.   RECOMMENDATIONS:  1. Continue lipid lowering therapy and antiplatelet therapy.  2. Consider exercise rehabilitation.  3. Check D-diamer.   Dictated by: Arturo Morton Riley Kill, M.D. [facility]  Attending Physician: Ronaldo Miyamoto  DD: 06/15/02   Assessment / Plan: 1. Chronic venous insufficiency - looks improved to me with support stockings. Weight is down 4 pounds today. I have left him off his Lasix.   2. Morbid obesity - this is probably the crux of his issues - very sedentary and not able to do any activity.   3. HTN - BP is ok at home. No dizziness - no need for Florinef at this time.   4. OSA - sees Dr. Shelle Iron next month - may need mask adjustment - would defer to his expertise.   5. Fatigue - seems to be a chronic issue - recent labs were unrevealing.   6. DM - poorly controlled per recent A1C.  Patient is agreeable to this plan and will call if any problems develop in the interim.   Rosalio Macadamia, RN, ANP-C [facility] [address] [place], [place]  [address]

## 2013-08-09 NOTE — Patient Instructions (Signed)
Try to be as active as possible  Stay off the Lasix and the Florinef  Keep wearing the support stockings  Talk to Dr. Shelle Iron about your CPAP machine  See Dr. Johney Frame back as planned.  Call the [facility] office at [phone] if you have any questions, problems or concerns.

## 2013-08-31 ENCOUNTER — Ambulatory Visit (INDEPENDENT_AMBULATORY_CARE_PROVIDER_SITE_OTHER): Payer: Medicare Other | Admitting: General Practice

## 2013-08-31 DIAGNOSIS — I743 Embolism and thrombosis of arteries of the lower extremities: Secondary | ICD-10-CM

## 2013-08-31 LAB — POCT INR: INR: 2.5

## 2013-09-13 ENCOUNTER — Ambulatory Visit: Payer: Medicare Other | Admitting: Pulmonary Disease

## 2013-09-13 ENCOUNTER — Encounter (HOSPITAL_COMMUNITY): Payer: Self-pay | Admitting: *Deleted

## 2013-09-13 ENCOUNTER — Emergency Department (HOSPITAL_COMMUNITY)
Admission: EM | Admit: 2013-09-13 | Discharge: 2013-09-13 | Disposition: A | Discharge status: Home / Self Care | Payer: Medicare Other | Source: Home / Self Care | Attending: Family Medicine | Admitting: Family Medicine

## 2013-09-13 DIAGNOSIS — N390 Urinary tract infection, site not specified: Secondary | ICD-10-CM

## 2013-09-13 LAB — POCT URINALYSIS DIP (DEVICE)
Bilirubin Urine: NEGATIVE
Glucose, UA: NEGATIVE mg/dL
Nitrite: POSITIVE — AB
Urobilinogen, UA: 1 mg/dL (ref 0.0–1.0)

## 2013-09-13 MED ORDER — CEPHALEXIN 500 MG PO CAPS: 500.0000 mg | ORAL_CAPSULE | Freq: Four times a day (QID) | ORAL | Status: DC

## 2013-09-13 NOTE — ED Notes (Signed)
Pt  Reports   Urinary  Frequency  /  Burning  As  Well   As     Bloody  Urine        X  sev  Days               Pt  Also  Reports  Some  Nausea  As  Well

## 2013-09-13 NOTE — ED Provider Notes (Signed)
CSN: [id number]     Arrival date & time 09/13/13  4098 History   First MD Initiated Contact with Patient 09/13/13 [phone]     Chief Complaint  Patient presents with  . Urinary Frequency   (Consider location/radiation/quality/duration/timing/severity/associated sxs/prior Treatment) Patient is a 72 y.o. male presenting with frequency. The history is provided by the patient.  Urinary Frequency This is a new problem. The current episode started 2 days ago. The problem has not changed since onset.Pertinent negatives include no abdominal pain.    Past Medical History  Diagnosis Date  . Diabetes mellitus   . Hypertension   . COPD (chronic obstructive pulmonary disease)     CPAP  . Renal insufficiency   . Hyperlipemia   . Anemia   . Other and unspecified coagulation defects   . Hypothyroidism   . CAD (coronary artery disease)   . Hyperkalemia   . Osteoarthritis   . Glaucoma   . Sick sinus syndrome     s/p PPM by JA  . Pancreatitis   . Obesity   . Constipation   . OSA on CPAP     using CPAP although it is hard for him to tolerate  . SOB (shortness of breath)    Past Surgical History  Procedure Laterality Date  . Nasal septum surgery  1967  . Pacemaker placement  06/2010    [facility] Accent RF DR, Model [id number] ( Serial number [id number])  . Thromboembolectomy and four compartment fasciotomy  2009  . Cholecystectomy     Family History  Problem Relation Age of Onset  . Liver cancer Mother     deceased age 48  . Cancer Mother     liver cancer  . Colon cancer Neg Hx   . Heart attack Father    History  Substance Use Topics  . Smoking status: Former Smoker -- 2.00 packs/day for 50 years    Types: Cigarettes    Quit date: 12/08/2006  . Smokeless tobacco: Never Used  . Alcohol Use: No    Review of Systems  Constitutional: Negative for fever and chills.  Gastrointestinal: Positive for nausea. Negative for vomiting and abdominal pain.  Genitourinary: Positive for  dysuria, frequency and hematuria. Negative for urgency, flank pain, discharge, penile swelling, penile pain and testicular pain.    Allergies  Pioglitazone  Home Medications   Current Outpatient Rx  Name  Route  Sig  Dispense  Refill  . albuterol (PROVENTIL HFA;VENTOLIN HFA) 108 (90 BASE) MCG/ACT inhaler   Inhalation   Inhale 2 puffs into the lungs every 6 (six) hours as needed for wheezing.         . Ascorbic Acid (VITAMIN C) 500 MG tablet   Oral   Take 500 mg by mouth daily.           . B-D INS SYR ULTRAFINE 1CC/30G 30G X 1/2" 1 ML MISC      USE FOR INSULIN TO TEST 4 TIMES DAILY   400 each   3   . brimonidine (ALPHAGAN P) 0.1 % SOLN      ONE DROP 3 TIMES A DAY LEFT EYE ONLY         . cephALEXin (KEFLEX) 500 MG capsule   Oral   Take 1 capsule (500 mg total) by mouth 4 (four) times daily. Take all of medicine and drink lots of fluids   28 capsule   0   . Cholecalciferol (VITAMIN D3) 400 UNITS CAPS   Oral  Take 1,000 Units by mouth daily.          Marland Kitchen dextromethorphan-guaiFENesin (MUCINEX DM) 30-600 MG per 12 hr tablet   Oral   Take 1 tablet by mouth as needed.          . dorzolamide-timolol (COSOPT) 22.3-6.8 MG/ML ophthalmic solution      ONE DROP 2 TIMES A DAY ON LEFT EYE         . fluticasone (FLONASE) 50 MCG/ACT nasal spray      USE TWO SPRAY AS NEEDED         . glucose blood (ONE TOUCH ULTRA TEST) test strip      Use as instructed four times daily dx 250.43   360 each   3   . insulin regular human CONCENTRATED (HUMULIN R) 500 UNIT/ML SOLN injection      4x a day (just before each meal) 300-250-300-125 units.   60 mL   3   . latanoprost (XALATAN) 0.005 % ophthalmic solution      ONE DROP A DAY ON LEFT EYE         . levothyroxine (SYNTHROID, LEVOTHROID) 50 MCG tablet      TAKE 1 TABLET EVERY DAY   30 tablet   7   . EXPIRED: mometasone (NASONEX) 50 MCG/ACT nasal spray   Nasal   Place 2 sprays into the nose daily.          .  Multiple Vitamin (MULTIVITAMIN) capsule   Oral   Take 1 capsule by mouth daily.         . OMEGA-3 1000 MG CAPS   Oral   Take 1 capsule by mouth daily.          Marland Kitchen omeprazole (PRILOSEC) 20 MG capsule   Oral   Take 1 capsule (20 mg total) by mouth daily.   30 capsule   4   . Pancrelipase, Lip-Prot-Amyl, 25000 UNITS CPEP   Oral   Take 25,000 Units by mouth 3 (three) times daily.          Marland Kitchen EXPIRED: PROAIR HFA 108 (90 BASE) MCG/ACT inhaler      INHALE 2 PUFFS EVERY 4 TO 6 HOURS AS NEEDED   8.5 g   8   . Probiotic Product (PROBIOTIC ACIDOPHILUS) CAPS   Oral   Take by mouth.           . rosuvastatin (CRESTOR) 40 MG tablet      1/2 tab daily   30 tablet   3   . warfarin (COUMADIN) 4 MG tablet      Take as directed by Anticoagulation clinic   50 tablet   2     30-day supply    BP 151/59  Pulse 62  Temp(Src) 97.8 F (36.6 C) (Oral)  Resp 24  SpO2 94% Physical Exam  Nursing note and vitals reviewed. Constitutional: He is oriented to person, place, and time. He appears well-developed and well-nourished.  Abdominal: Soft. Bowel sounds are normal. He exhibits no distension and no mass. There is no tenderness. There is no rebound and no guarding.  Genitourinary: Penis normal.  Neurological: He is alert and oriented to person, place, and time.  Skin: Skin is warm and dry.    ED Course  Procedures (including critical care time) Labs Review Labs Reviewed  POCT URINALYSIS DIP (DEVICE) - Abnormal; Notable for the following:    Hgb urine dipstick LARGE (*)    Protein, ur >=300 (*)  Nitrite POSITIVE (*)    Leukocytes, UA TRACE (*)    All other components within normal limits  URINE CULTURE   Imaging Review No results found.  MDM      Linna Hoff, MD 09/13/13 [phone]

## 2013-09-15 LAB — URINE CULTURE: Colony Count: >=100000

## 2013-09-16 NOTE — ED Notes (Signed)
Urine culture: >100,000 colonies Streptococcus group D; high probability for S. Bovis.  Pt. Treated with Keflex.  10/9 Message sent to Dr. Artis Flock.  10/10  He said it was OK. [name] 09/16/2013

## 2013-09-21 ENCOUNTER — Other Ambulatory Visit: Payer: Self-pay

## 2013-09-21 MED ORDER — GLUCOSE BLOOD VI STRP: ORAL_STRIP | Status: DC

## 2013-09-22 ENCOUNTER — Other Ambulatory Visit: Payer: Self-pay | Admitting: *Deleted

## 2013-09-22 MED ORDER — WARFARIN SODIUM 4 MG PO TABS: ORAL_TABLET | ORAL | Status: DC

## 2013-09-26 ENCOUNTER — Encounter: Payer: Self-pay | Admitting: Endocrinology

## 2013-09-26 ENCOUNTER — Ambulatory Visit (INDEPENDENT_AMBULATORY_CARE_PROVIDER_SITE_OTHER): Payer: Medicare Other | Admitting: Endocrinology

## 2013-09-26 VITALS — BP 130/70 | HR 78 | Temp 98.1°F | Wt 324.7 lb

## 2013-09-26 DIAGNOSIS — E1029 Type 1 diabetes mellitus with other diabetic kidney complication: Secondary | ICD-10-CM

## 2013-09-26 LAB — HEMOGLOBIN A1C: Hgb A1c MFr Bld: 7.7 % — ABNORMAL HIGH (ref 4.6–6.5)

## 2013-09-26 MED ORDER — TRAMADOL HCL 50 MG PO TABS: 100.0000 mg | ORAL_TABLET | Freq: Four times a day (QID) | ORAL | Status: DC | PRN

## 2013-09-26 NOTE — Progress Notes (Signed)
Subjective:    Patient ID: [name], male    DOB: [birth year], 72 y.o.   MRN: [id number]  HPI Pt return for f/u of insulin-requiring DM (dx'ed 1988, when he presented with polyuria; he has has been on insulin since 1989; he has mild if any neuropathy of the lower extremities, and associated renal insufficiency and CAD; characterized by severe insulin resistance; he has never had severe hypoglycemia or DKA).  he brings a record of his cbg's which i have reviewed today.  It varies from 58-200.  It is in general higher as the day goes on.  He has mild hypoglycemia approx once a week, usually before breakfast.  Pt saw a pharmacist, who wants pt to take ACEI.   Past Medical History  Diagnosis Date  . Diabetes mellitus   . Hypertension   . COPD (chronic obstructive pulmonary disease)     CPAP  . Renal insufficiency   . Hyperlipemia   . Anemia   . Other and unspecified coagulation defects   . Hypothyroidism   . CAD (coronary artery disease)   . Hyperkalemia   . Osteoarthritis   . Glaucoma   . Sick sinus syndrome     s/p PPM by JA  . Pancreatitis   . Obesity   . Constipation   . OSA on CPAP     using CPAP although it is hard for him to tolerate  . SOB (shortness of breath)     Past Surgical History  Procedure Laterality Date  . Nasal septum surgery  1967  . Pacemaker placement  06/2010    [facility] Accent RF DR, Model [id number] ( Serial number [id number])  . Thromboembolectomy and four compartment fasciotomy  2009  . Cholecystectomy      History   Social History  . Marital Status: Married    Spouse Name: N/A    Number of Children: N/A  . 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 Smoking status: Former Smoker -- 2.00 packs/day for 50 years    Types: Cigarettes    Quit date: 12/08/2006  . Smokeless tobacco: Never Used  . Alcohol Use: No  . Drug Use: No  . Sexual Activity: Not on file   Other  Topics Concern  . Not on file   Social History Narrative  . No narrative on file    Current Outpatient Prescriptions on File Prior to Visit  Medication Sig Dispense Refill  . albuterol (PROVENTIL HFA;VENTOLIN HFA) 108 (90 BASE) MCG/ACT inhaler Inhale 2 puffs into the lungs every 6 (six) hours as needed for wheezing.      . Ascorbic Acid (VITAMIN C) 500 MG tablet Take 500 mg by mouth daily.        . B-D INS SYR ULTRAFINE 1CC/30G 30G X 1/2" 1 ML MISC USE FOR INSULIN TO TEST 4 TIMES DAILY  400 each  3  . brimonidine (ALPHAGAN P) 0.1 % SOLN ONE DROP 3 TIMES A DAY LEFT EYE ONLY      . Cholecalciferol (VITAMIN D3) 400 UNITS CAPS Take 1,000 Units by mouth daily.       Marland Kitchen dextromethorphan-guaiFENesin (MUCINEX DM) 30-600 MG per 12 hr tablet Take 1 tablet by mouth as needed.       . dorzolamide-timolol (COSOPT) 22.3-6.8 MG/ML ophthalmic solution ONE DROP 2 TIMES A DAY ON LEFT EYE      .  fluticasone (FLONASE) 50 MCG/ACT nasal spray USE TWO SPRAY AS NEEDED      . glucose blood (ONE TOUCH ULTRA TEST) test strip Use as instructed four times daily dx 250.43  360 each  3  . latanoprost (XALATAN) 0.005 % ophthalmic solution ONE DROP A DAY ON LEFT EYE      . levothyroxine (SYNTHROID, LEVOTHROID) 50 MCG tablet TAKE 1 TABLET EVERY DAY  30 tablet  7  . Multiple Vitamin (MULTIVITAMIN) capsule Take 1 capsule by mouth daily.      . OMEGA-3 1000 MG CAPS Take 1 capsule by mouth daily.       Marland Kitchen omeprazole (PRILOSEC) 20 MG capsule Take 1 capsule (20 mg total) by mouth daily.  30 capsule  4  . Pancrelipase, Lip-Prot-Amyl, 25000 UNITS CPEP Take 25,000 Units by mouth 3 (three) times daily.       . Probiotic Product (PROBIOTIC ACIDOPHILUS) CAPS Take by mouth.        . rosuvastatin (CRESTOR) 40 MG tablet 1/2 tab daily  30 tablet  3  . warfarin (COUMADIN) 4 MG tablet Take as directed by Anticoagulation clinic  50 tablet  2  . mometasone (NASONEX) 50 MCG/ACT nasal spray Place 2 sprays into the nose daily.       Marland Kitchen PROAIR HFA  108 (90 BASE) MCG/ACT inhaler INHALE 2 PUFFS EVERY 4 TO 6 HOURS AS NEEDED  8.5 g  8   No current facility-administered medications on file prior to visit.    Allergies  Allergen Reactions  . Pioglitazone     ACTOS  REACTION: Edema    Family History  Problem Relation Age of Onset  . Liver cancer Mother     deceased age 15  . Cancer Mother     liver cancer  . Colon cancer Neg Hx   . Heart attack Father     BP 130/70  Pulse 78  Temp(Src) 98.1 F (36.7 C) (Oral)  Wt 324 lb 11.2 oz (147.283 kg)  BMI 46.59 kg/m2  SpO2 98%   Review of Systems Denies LOC and weight change. He has chronic low-back pain.  Tylenol does not help.    Objective:   Physical Exam VITAL SIGNS:  See vs page GENERAL: no distress SKIN:  Insulin injection sites at the anterior abdomen are normal   Lab Results  Component Value Date   HGBA1C 7.7* 09/26/2013      Assessment & Plan:  This insulin regimen was chosen from multiple options, as it best matches his insulin to his changing requirements throughout the day.  The benefits of glycemic control must be weighed against the risks of hypoglycemia.  He needs increased rx Hyperkalemia: this precludes ACEI.   Low-back pain: this limits exercise rx of DM.

## 2013-09-26 NOTE — Patient Instructions (Addendum)
check your blood sugar twice a day.  vary the time of day when you check, between before the 3 meals, and at bedtime.  also check if you have symptoms of your blood sugar being too high or too low.  please keep a record of the readings and bring it to your next appointment here.  You can write it on any piece of paper.  please call us sooner if your blood sugar goes below 70, or if you have a lot of readings over 200. blood tests are being requested for you today.  We'll contact you with results. Please come back for a follow-up appointment in 3 months. Here is a prescription for your back pain.

## 2013-09-30 ENCOUNTER — Ambulatory Visit (INDEPENDENT_AMBULATORY_CARE_PROVIDER_SITE_OTHER): Payer: Medicare Other | Admitting: Endocrinology

## 2013-09-30 ENCOUNTER — Ambulatory Visit (INDEPENDENT_AMBULATORY_CARE_PROVIDER_SITE_OTHER): Payer: Medicare Other | Admitting: Family Medicine

## 2013-09-30 VITALS — BP 126/78 | HR 82 | Wt 323.0 lb

## 2013-09-30 DIAGNOSIS — N3000 Acute cystitis without hematuria: Secondary | ICD-10-CM

## 2013-09-30 DIAGNOSIS — I743 Embolism and thrombosis of arteries of the lower extremities: Secondary | ICD-10-CM

## 2013-09-30 LAB — POCT INR: INR: 2.7

## 2013-09-30 LAB — URINALYSIS, ROUTINE W REFLEX MICROSCOPIC
Ketones, ur: 15
Specific Gravity, Urine: 1.02 (ref 1.000–1.030)
Total Protein, Urine: >=300
Urine Glucose: 100
pH: 6.5 (ref 5.0–8.0)

## 2013-09-30 MED ORDER — AMOXICILLIN 500 MG PO CAPS: 500.0000 mg | ORAL_CAPSULE | Freq: Three times a day (TID) | ORAL | Status: DC

## 2013-09-30 NOTE — Progress Notes (Signed)
Subjective:    Patient ID: [name], male    DOB: December 27, 1940, 72 y.o.   MRN: [id number]  HPI Pt finished keflex 10 days ago.  He saw urol 3 days ago, where UA was clean.  He now has 3 days of dark urine (similar to previous UIT's), but no change in chronic moderate pain at the lower back, or assoc fever. Past Medical History  Diagnosis Date  . Diabetes mellitus   . Hypertension   . COPD (chronic obstructive pulmonary disease)     CPAP  . Renal insufficiency   . Hyperlipemia   . Anemia   . Other and unspecified coagulation defects   . Hypothyroidism   . CAD (coronary artery disease)   . Hyperkalemia   . Osteoarthritis   . Glaucoma   . Sick sinus syndrome     s/p PPM by JA  . Pancreatitis   . Obesity   . Constipation   . OSA on CPAP     using CPAP although it is hard for him to tolerate  . SOB (shortness of breath)     Past Surgical History  Procedure Laterality Date  . Nasal septum surgery  1967  . Pacemaker placement  06/2010    [facility] Accent RF DR, Model [id number] ( Serial number [id number])  . Thromboembolectomy and four compartment fasciotomy  2009  . Cholecystectomy      History   Social History  . Marital Status: Married    Spouse Name: N/A    Number of Children: N/A  . 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 Smoking status: Former Smoker -- 2.00 packs/day for 50 years    Types: Cigarettes    Quit date: 12/08/2006  . Smokeless tobacco: Never Used  . Alcohol Use: No  . Drug Use: No  . Sexual Activity: Not on file   Other Topics Concern  . Not on file   Social History Narrative  . No narrative on file    Current Outpatient Prescriptions on File Prior to Visit  Medication Sig Dispense Refill  . albuterol (PROVENTIL HFA;VENTOLIN HFA) 108 (90 BASE) MCG/ACT inhaler Inhale 2 puffs into the lungs every 6 (six) hours as needed for wheezing.      . Ascorbic Acid (VITAMIN C)  500 MG tablet Take 500 mg by mouth daily.        . B-D INS SYR ULTRAFINE 1CC/30G 30G X 1/2" 1 ML MISC USE FOR INSULIN TO TEST 4 TIMES DAILY  400 each  3  . brimonidine (ALPHAGAN P) 0.1 % SOLN Place 1 drop into the left eye every 8 (eight) hours.       . Cholecalciferol (VITAMIN D3) 400 UNITS CAPS Take 1,000 Units by mouth daily.       Marland Kitchen dextromethorphan-guaiFENesin (MUCINEX DM) 30-600 MG per 12 hr tablet Take 1 tablet by mouth as needed.       . dorzolamide-timolol (COSOPT) 22.3-6.8 MG/ML ophthalmic solution Place 1 drop into both eyes.       . fluticasone (FLONASE) 50 MCG/ACT nasal spray Place 1 spray into the nose daily. USE TWO SPRAY AS NEEDED      . glucose blood (ONE TOUCH ULTRA TEST) test strip Use as instructed four times daily dx 250.43  360 each  3  . insulin regular human CONCENTRATED (HUMULIN R) 500 UNIT/ML SOLN injection 4x a day (  just before each meal) 250-200-250-150.      Marland Kitchen latanoprost (XALATAN) 0.005 % ophthalmic solution Place 1 drop into both eyes at bedtime.       . Multiple Vitamin (MULTIVITAMIN) capsule Take 1 capsule by mouth daily.      . OMEGA-3 1000 MG CAPS Take 1 capsule by mouth daily.       Marland Kitchen omeprazole (PRILOSEC) 20 MG capsule Take 1 capsule (20 mg total) by mouth daily.  30 capsule  4  . Pancrelipase, Lip-Prot-Amyl, 25000 UNITS CPEP Take 25,000 Units by mouth 3 (three) times daily.       . Probiotic Product (PROBIOTIC ACIDOPHILUS) CAPS Take by mouth.        . rosuvastatin (CRESTOR) 40 MG tablet 1/2 tab daily  30 tablet  3  . traMADol (ULTRAM) 50 MG tablet Take 2 tablets (100 mg total) by mouth every 6 (six) hours as needed for pain.  50 tablet  5  . warfarin (COUMADIN) 4 MG tablet Take as directed by Anticoagulation clinic  50 tablet  2  . mometasone (NASONEX) 50 MCG/ACT nasal spray Place 2 sprays into the nose daily.        No current facility-administered medications on file prior to visit.    Allergies  Allergen Reactions  . Pioglitazone     ACTOS  REACTION:  Edema    Family History  Problem Relation Age of Onset  . Liver cancer Mother     deceased age 36  . Cancer Mother     liver cancer  . Colon cancer Neg Hx   . Heart attack Father     BP 126/78  Pulse 82  Wt 323 lb (146.512 kg)  BMI 46.35 kg/m2  SpO2 95%  Review of Systems Denies hematuria and dysuria.      Objective:   Physical Exam VITAL SIGNS:  See vs page GENERAL: no distress Abd: no suprapubic tenderness.     (i reviewed recent urine c/s:  s. bovis)    Assessment & Plan:  UTI: recurrent.

## 2013-09-30 NOTE — Patient Instructions (Addendum)
Let's recheck the urine today.  We'll contact you with results.  i have sent a prescription to your pharmacy, for a different antibiotic.

## 2013-10-01 ENCOUNTER — Emergency Department (HOSPITAL_COMMUNITY)
Admission: EM | Admit: 2013-10-01 | Discharge: 2013-10-01 | Disposition: A | Discharge status: Home / Self Care | Payer: Medicare Other | Attending: Emergency Medicine | Admitting: Emergency Medicine

## 2013-10-01 ENCOUNTER — Emergency Department (HOSPITAL_COMMUNITY): Payer: Medicare Other

## 2013-10-01 ENCOUNTER — Encounter (HOSPITAL_COMMUNITY): Payer: Self-pay | Admitting: Emergency Medicine

## 2013-10-01 DIAGNOSIS — Z7982 Long term (current) use of aspirin: Secondary | ICD-10-CM | POA: Insufficient documentation

## 2013-10-01 DIAGNOSIS — Z950 Presence of cardiac pacemaker: Secondary | ICD-10-CM | POA: Insufficient documentation

## 2013-10-01 DIAGNOSIS — M1990 Unspecified osteoarthritis, unspecified site: Secondary | ICD-10-CM | POA: Insufficient documentation

## 2013-10-01 DIAGNOSIS — Z794 Long term (current) use of insulin: Secondary | ICD-10-CM | POA: Insufficient documentation

## 2013-10-01 DIAGNOSIS — D689 Coagulation defect, unspecified: Secondary | ICD-10-CM | POA: Insufficient documentation

## 2013-10-01 DIAGNOSIS — Z792 Long term (current) use of antibiotics: Secondary | ICD-10-CM | POA: Insufficient documentation

## 2013-10-01 DIAGNOSIS — Z87440 Personal history of urinary (tract) infections: Secondary | ICD-10-CM | POA: Insufficient documentation

## 2013-10-01 DIAGNOSIS — J4489 Other specified chronic obstructive pulmonary disease: Secondary | ICD-10-CM | POA: Insufficient documentation

## 2013-10-01 DIAGNOSIS — E119 Type 2 diabetes mellitus without complications: Secondary | ICD-10-CM | POA: Insufficient documentation

## 2013-10-01 DIAGNOSIS — I2510 Atherosclerotic heart disease of native coronary artery without angina pectoris: Secondary | ICD-10-CM | POA: Insufficient documentation

## 2013-10-01 DIAGNOSIS — R310 Gross hematuria: Secondary | ICD-10-CM | POA: Insufficient documentation

## 2013-10-01 DIAGNOSIS — E785 Hyperlipidemia, unspecified: Secondary | ICD-10-CM | POA: Insufficient documentation

## 2013-10-01 DIAGNOSIS — G4733 Obstructive sleep apnea (adult) (pediatric): Secondary | ICD-10-CM | POA: Insufficient documentation

## 2013-10-01 DIAGNOSIS — Z87891 Personal history of nicotine dependence: Secondary | ICD-10-CM | POA: Insufficient documentation

## 2013-10-01 DIAGNOSIS — H409 Unspecified glaucoma: Secondary | ICD-10-CM | POA: Insufficient documentation

## 2013-10-01 DIAGNOSIS — R109 Unspecified abdominal pain: Secondary | ICD-10-CM

## 2013-10-01 DIAGNOSIS — D649 Anemia, unspecified: Secondary | ICD-10-CM | POA: Insufficient documentation

## 2013-10-01 DIAGNOSIS — I10 Essential (primary) hypertension: Secondary | ICD-10-CM | POA: Insufficient documentation

## 2013-10-01 DIAGNOSIS — E669 Obesity, unspecified: Secondary | ICD-10-CM | POA: Insufficient documentation

## 2013-10-01 DIAGNOSIS — K859 Acute pancreatitis without necrosis or infection, unspecified: Secondary | ICD-10-CM | POA: Insufficient documentation

## 2013-10-01 DIAGNOSIS — E039 Hypothyroidism, unspecified: Secondary | ICD-10-CM | POA: Insufficient documentation

## 2013-10-01 DIAGNOSIS — IMO0002 Reserved for concepts with insufficient information to code with codable children: Secondary | ICD-10-CM | POA: Insufficient documentation

## 2013-10-01 DIAGNOSIS — J449 Chronic obstructive pulmonary disease, unspecified: Secondary | ICD-10-CM | POA: Insufficient documentation

## 2013-10-01 DIAGNOSIS — Z79899 Other long term (current) drug therapy: Secondary | ICD-10-CM | POA: Insufficient documentation

## 2013-10-01 DIAGNOSIS — Z7901 Long term (current) use of anticoagulants: Secondary | ICD-10-CM | POA: Insufficient documentation

## 2013-10-01 LAB — CBC WITH DIFFERENTIAL/PLATELET
Basophils Absolute: 0 10*3/uL (ref 0.0–0.1)
Eosinophils Absolute: 0.2 10*3/uL (ref 0.0–0.7)
Eosinophils Relative: 2 % (ref 0–5)
Hemoglobin: 14.1 g/dL (ref 13.0–17.0)
Lymphs Abs: 2.5 10*3/uL (ref 0.7–4.0)
MCH: 32.5 pg (ref 26.0–34.0)
MCV: 95.2 fL (ref 78.0–100.0)
Neutrophils Relative %: 69 % (ref 43–77)
Platelets: 224 10*3/uL (ref 150–400)
RBC: 4.34 MIL/uL (ref 4.22–5.81)
RDW: 14.5 % (ref 11.5–15.5)
WBC: 11.9 10*3/uL — ABNORMAL HIGH (ref 4.0–10.5)

## 2013-10-01 LAB — COMPREHENSIVE METABOLIC PANEL
ALT: 37 U/L (ref 0–53)
AST: 38 U/L — ABNORMAL HIGH (ref 0–37)
Albumin: 3.2 g/dL — ABNORMAL LOW (ref 3.5–5.2)
Alkaline Phosphatase: 96 U/L (ref 39–117)
BUN: 18 mg/dL (ref 6–23)
GFR calc Af Amer: 80 mL/min — ABNORMAL LOW (ref >90)
Glucose, Bld: 224 mg/dL — ABNORMAL HIGH (ref 70–99)
Potassium: 4.4 mEq/L (ref 3.5–5.1)
Sodium: 136 mEq/L (ref 135–145)
Total Protein: 7.3 g/dL (ref 6.0–8.3)

## 2013-10-01 LAB — URINALYSIS, ROUTINE W REFLEX MICROSCOPIC
Glucose, UA: 100 mg/dL — AB
Ketones, ur: 15 mg/dL — AB
Specific Gravity, Urine: 1.022 (ref 1.005–1.030)
Urobilinogen, UA: 1 mg/dL (ref 0.0–1.0)
pH: 5.5 (ref 5.0–8.0)

## 2013-10-01 LAB — URINE MICROSCOPIC-ADD ON

## 2013-10-01 MED ORDER — HYDROMORPHONE HCL PF 1 MG/ML IJ SOLN
1.0000 mg | Freq: Once | INTRAMUSCULAR | Status: AC
  Administered 2013-10-01: 1 mg via INTRAVENOUS
  Filled 2013-10-01: qty 1

## 2013-10-01 MED ORDER — ONDANSETRON HCL 4 MG/2ML IJ SOLN
4.0000 mg | Freq: Once | INTRAMUSCULAR | Status: AC
  Administered 2013-10-01: 4 mg via INTRAVENOUS
  Filled 2013-10-01: qty 2

## 2013-10-01 MED ORDER — SODIUM CHLORIDE 0.9 % IV SOLN
1000.0000 mL | INTRAVENOUS | Status: DC
  Administered 2013-10-01: 1000 mL via INTRAVENOUS

## 2013-10-01 MED ORDER — HYDROCODONE-ACETAMINOPHEN 5-325 MG PO TABS: 1.0000 | ORAL_TABLET | Freq: Four times a day (QID) | ORAL | Status: DC | PRN

## 2013-10-01 NOTE — ED Notes (Signed)
Discharge instructions reviewed with pt. Pt verbalized understanding.   

## 2013-10-01 NOTE — ED Provider Notes (Signed)
CSN: [id number]     Arrival date & time 10/01/13  4098 History   First MD Initiated Contact with Patient 10/01/13 [phone]     Chief Complaint  Patient presents with  . Hematuria   HPI  Patient presents with concern of abdominal pain, hematuria. Notably, the patient has been evaluated for urinary tract infection twice in the past month, and is currently taking amoxicillin. He states over the past 2 days he has had increasing pain in his suprapubic area, with persistent hematuria. He has recently also started to have clots in his urine. There is no concurrent lightheadedness, syncope, other abdominal pain, vomiting, diarrhea. Minimal relief with OTC medication.   Past Medical History  Diagnosis Date  . Diabetes mellitus   . Hypertension   . COPD (chronic obstructive pulmonary disease)     CPAP  . Renal insufficiency   . Hyperlipemia   . Anemia   . Other and unspecified coagulation defects   . Hypothyroidism   . CAD (coronary artery disease)   . Hyperkalemia   . Osteoarthritis   . Glaucoma   . Sick sinus syndrome     s/p PPM by JA  . Pancreatitis   . Obesity   . Constipation   . OSA on CPAP     using CPAP although it is hard for him to tolerate  . SOB (shortness of breath)    Past Surgical History  Procedure Laterality Date  . Nasal septum surgery  1967  . Pacemaker placement  06/2010    [facility] Accent RF DR, Model [id number] ( Serial number [id number])  . Thromboembolectomy and four compartment fasciotomy  2009  . Cholecystectomy     Family History  Problem Relation Age of Onset  . Liver cancer Mother     deceased age 30  . Cancer Mother     liver cancer  . Colon cancer Neg Hx   . Heart attack Father    History  Substance Use Topics  . Smoking status: Former Smoker -- 2.00 packs/day for 50 years    Types: Cigarettes    Quit date: 12/08/2006  . Smokeless tobacco: Never Used  . Alcohol Use: No    Review of Systems  Constitutional:       Per HPI,  otherwise negative  HENT:       Per HPI, otherwise negative  Respiratory:       Per HPI, otherwise negative  Cardiovascular:       Per HPI, otherwise negative  Gastrointestinal: Negative for vomiting.  Endocrine:       Negative aside from HPI  Genitourinary:       Neg aside from HPI   Musculoskeletal:       Per HPI, otherwise negative  Skin: Negative.   Neurological: Negative for syncope.    Allergies  Pioglitazone  Home Medications   Current Outpatient Rx  Name  Route  Sig  Dispense  Refill  . acetaminophen (TYLENOL) 500 MG tablet   Oral   Take 500 mg by mouth every 6 (six) hours as needed for pain.         Marland Kitchen albuterol (PROVENTIL HFA;VENTOLIN HFA) 108 (90 BASE) MCG/ACT inhaler   Inhalation   Inhale 2 puffs into the lungs every 6 (six) hours as needed for wheezing.         Marland Kitchen amoxicillin (AMOXIL) 500 MG capsule   Oral   Take 1 capsule (500 mg total) by mouth 3 (three) times daily.  30 capsule   0   . Ascorbic Acid (VITAMIN C) 500 MG tablet   Oral   Take 500 mg by mouth daily.           Marland Kitchen aspirin 325 MG tablet   Oral   Take 325 mg by mouth daily.         . B-D INS SYR ULTRAFINE 1CC/30G 30G X 1/2" 1 ML MISC      USE FOR INSULIN TO TEST 4 TIMES DAILY   400 each   3   . brimonidine (ALPHAGAN P) 0.1 % SOLN   Left Eye   Place 1 drop into the left eye every 8 (eight) hours.          . Cholecalciferol (VITAMIN D3) 400 UNITS CAPS   Oral   Take 1,000 Units by mouth daily.          Marland Kitchen dextromethorphan-guaiFENesin (MUCINEX DM) 30-600 MG per 12 hr tablet   Oral   Take 1 tablet by mouth as needed.          . dorzolamide-timolol (COSOPT) 22.3-6.8 MG/ML ophthalmic solution   Both Eyes   Place 1 drop into both eyes.          . fluticasone (FLONASE) 50 MCG/ACT nasal spray   Nasal   Place 1 spray into the nose daily. USE TWO SPRAY AS NEEDED         . glucose blood (ONE TOUCH ULTRA TEST) test strip      Use as instructed four times daily dx  250.43   360 each   3   . insulin regular human CONCENTRATED (HUMULIN R) 500 UNIT/ML SOLN injection      4x a day (just before each meal) 250-200-250-150.         Marland Kitchen latanoprost (XALATAN) 0.005 % ophthalmic solution   Both Eyes   Place 1 drop into both eyes at bedtime.          Marland Kitchen levothyroxine (SYNTHROID, LEVOTHROID) 50 MCG tablet   Oral   Take 50 mcg by mouth daily before breakfast.         . Multiple Vitamin (MULTIVITAMIN) capsule   Oral   Take 1 capsule by mouth daily.         . OMEGA-3 1000 MG CAPS   Oral   Take 1 capsule by mouth daily.          Marland Kitchen omeprazole (PRILOSEC) 20 MG capsule   Oral   Take 1 capsule (20 mg total) by mouth daily.   30 capsule   4   . Pancrelipase, Lip-Prot-Amyl, 25000 UNITS CPEP   Oral   Take 25,000 Units by mouth 3 (three) times daily.          . Probiotic Product (PROBIOTIC ACIDOPHILUS) CAPS   Oral   Take by mouth.           . rosuvastatin (CRESTOR) 40 MG tablet      1/2 tab daily   30 tablet   3   . traMADol (ULTRAM) 50 MG tablet   Oral   Take 2 tablets (100 mg total) by mouth every 6 (six) hours as needed for pain.   50 tablet   5   . warfarin (COUMADIN) 4 MG tablet      Take as directed by Anticoagulation clinic   50 tablet   2     30-day supply   . warfarin (COUMADIN) 6 MG tablet   Oral  Take 6 mg by mouth daily. On Monday,tues,thursday,friday,sat and sundays         . HYDROcodone-acetaminophen (NORCO/VICODIN) 5-325 MG per tablet   Oral   Take 1 tablet by mouth every 6 (six) hours as needed for pain.   15 tablet   0   . EXPIRED: mometasone (NASONEX) 50 MCG/ACT nasal spray   Nasal   Place 2 sprays into the nose daily.           BP 129/45  Pulse 67  Temp(Src) 97.4 F (36.3 C) (Oral)  Resp 20  SpO2 96% Physical Exam  Nursing note and vitals reviewed. Constitutional: He is oriented to person, place, and time. He appears well-developed. No distress.  HENT:  Head: Normocephalic and  atraumatic.  Eyes: Conjunctivae and EOM are normal.  Cardiovascular: Normal rate and regular rhythm.   Pulmonary/Chest: Effort normal. No stridor. No respiratory distress.  Abdominal: He exhibits no distension. There is tenderness in the suprapubic area. There is no rigidity, no rebound and no guarding.  Genitourinary:  Gross hematuria  Musculoskeletal: He exhibits no edema.  Neurological: He is alert and oriented to person, place, and time.  Skin: Skin is warm and dry.  Psychiatric: He has a normal mood and affect.    ED Course  Procedures (including critical care time) Labs Review Labs Reviewed  CBC WITH DIFFERENTIAL - Abnormal; Notable for the following:    WBC 11.9 (*)    Neutro Abs 8.3 (*)    All other components within normal limits  COMPREHENSIVE METABOLIC PANEL - Abnormal; Notable for the following:    Glucose, Bld 224 (*)    Albumin 3.2 (*)    AST 38 (*)    GFR calc non Af Amer 69 (*)    GFR calc Af Amer 80 (*)    All other components within normal limits  URINALYSIS, ROUTINE W REFLEX MICROSCOPIC - Abnormal; Notable for the following:    Color, Urine RED (*)    APPearance TURBID (*)    Glucose, UA 100 (*)    Hgb urine dipstick LARGE (*)    Bilirubin Urine MODERATE (*)    Ketones, ur 15 (*)    Protein, ur >300 (*)    Leukocytes, UA MODERATE (*)    All other components within normal limits  LIPASE, BLOOD  URINE MICROSCOPIC-ADD ON   Imaging Review Ct Abdomen Pelvis Wo Contrast  10/01/2013   CLINICAL DATA:  Hematuria, lower abdominal pain and dysuria.  EXAM: CT ABDOMEN AND PELVIS WITHOUT  TECHNIQUE: Multidetector CT imaging of the abdomen and pelvis was performed following the standard protocol without IV contrast.  COMPARISON:  CT of the abdomen and pelvis with contrast on 06/16/2011.  FINDINGS: High density soft tissue abnormality in the posterior bladder near the trigone measures roughly 3 x 6 cm. While some of this material may be clotted blood, there is suspicion  that this may represent predominantly bladder tumor. No significant associated bladder distension is identified. Correlation with elective cystoscopy is recommended.  The kidneys themselves show no evidence of obstruction or calculi. The ureters are of normal caliber and show no calculi.  The gallbladder has been removed. Stable pancreatic calcifications are identified related to chronic pancreatitis. Unenhanced appearance of the liver, spleen and adrenal glands are stable and unremarkable. There is no evidence of bowel dilatation. No free fluid or abscess is identified.  Some pelvic lymph node prominence is identified. Enlarged distal left external iliac/ high inguinal lymph node measures 1.6 cm  in short axis and is located just lateral to the bladder. Medial high inguinal node on the right measures approximately 9 mm in short axis.  Bony structures show diffuse degenerative changes of the spine which are stable.  IMPRESSION: High density in the posterior bladder near the trigone. While some of this density may be consistent with clotted blood, there is suspicion that there is a component of bladder tumor and correlation with elective cystoscopy is recommended. At least one enlarged distal left iliac/high inguinal lymph node is identified. There is no evidence of hydronephrosis.   Electronically Signed   By: Irish Lack M.D.   On: 10/01/2013 10:02    EKG Interpretation   None      after the return of labs, CT scan and discussed the patient's case with his urologist.  Patient requires cystoscopy, and this will be arranged via urology.  MDM   1. Abdominal pain    This patient presents with abdominal pain, hematuria.  On exam he is awake and alert and hemodynamically stable.  However, the patient's CT scan raises concern for either complicated urinary tract infection versus possible malignancy.  After discussion with the patient's urologist, he was appropriate for outpatient management given his  continued production of urine, though it is bloody, and the absence of distress.     Gerhard Munch, MD 10/01/13 1252

## 2013-10-01 NOTE — ED Notes (Signed)
Recent treatment for UTI with Amoxicillin.  Since last night hematuria, frequency and dysuria.

## 2013-10-02 LAB — URINE CULTURE: Colony Count: >=100000

## 2013-10-12 ENCOUNTER — Telehealth: Payer: Self-pay | Admitting: Internal Medicine

## 2013-10-12 ENCOUNTER — Encounter: Payer: Self-pay | Admitting: Endocrinology

## 2013-10-12 NOTE — Telephone Encounter (Signed)
New Problem:  [name] from [facility] states she is faxing over a request for surgical clearance. [name] would like to know if Dr. Johney Frame can fill it out without seeing the pt or if the pt needs an appt. Please advise

## 2013-10-14 NOTE — Telephone Encounter (Signed)
Patient appeared to be doing well when seen by Norma Fredrickson in September.  Proceed with the procedure if necessary.

## 2013-10-18 ENCOUNTER — Ambulatory Visit: Payer: Medicare Other | Admitting: Pulmonary Disease

## 2013-10-19 ENCOUNTER — Telehealth: Payer: Self-pay | Admitting: Internal Medicine

## 2013-10-19 ENCOUNTER — Telehealth: Payer: Self-pay | Admitting: Endocrinology

## 2013-10-19 ENCOUNTER — Ambulatory Visit (INDEPENDENT_AMBULATORY_CARE_PROVIDER_SITE_OTHER): Payer: Medicare Other | Admitting: Endocrinology

## 2013-10-19 ENCOUNTER — Encounter: Payer: Self-pay | Admitting: Endocrinology

## 2013-10-19 VITALS — BP 124/74 | HR 80 | Temp 98.0°F | Resp 20 | Ht 70.0 in | Wt 317.9 lb

## 2013-10-19 DIAGNOSIS — M545 Low back pain, unspecified: Secondary | ICD-10-CM

## 2013-10-19 DIAGNOSIS — F329 Major depressive disorder, single episode, unspecified: Secondary | ICD-10-CM

## 2013-10-19 MED ORDER — ALPRAZOLAM 0.25 MG PO TABS: 0.2500 mg | ORAL_TABLET | Freq: Three times a day (TID) | ORAL | Status: DC | PRN

## 2013-10-19 MED ORDER — VENLAFAXINE HCL ER 150 MG PO CP24: 150.0000 mg | ORAL_CAPSULE | Freq: Every day | ORAL | Status: DC

## 2013-10-19 NOTE — Telephone Encounter (Signed)
New problem   Fax request on  11/5 . Status of cardiac clearance

## 2013-10-19 NOTE — Progress Notes (Signed)
Subjective:    Patient ID: [name], male    DOB: Aug 16, 1941, 72 y.o.   MRN: [id number]  HPI Pt states 1 week of moderate anxiety and depression, in the context of dx of bladder cancer. He has chronic pain at the lower back.  He controls this with tramadol. Past Medical History  Diagnosis Date  . Diabetes mellitus   . Hypertension   . COPD (chronic obstructive pulmonary disease)     CPAP  . Renal insufficiency   . Hyperlipemia   . Anemia   . Other and unspecified coagulation defects   . Hypothyroidism   . CAD (coronary artery disease)   . Hyperkalemia   . Osteoarthritis   . Glaucoma   . Sick sinus syndrome     s/p PPM by JA  . Pancreatitis   . Obesity   . Constipation   . OSA on CPAP     using CPAP although it is hard for him to tolerate  . SOB (shortness of breath)     Past Surgical History  Procedure Laterality Date  . Nasal septum surgery  1967  . Pacemaker placement  06/2010    [facility] Acc[employer] RF DR, Model [id number] ( Serial number [id number])  . Thromboembolectomy and four compartment fasciotomy  2009  . Cholecystectomy      History   Social History  . Marital Status: Married    Spouse Name: N/A    Number of Children: N/A  . 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 Smoking status: Former Smoker -- 2.00 packs/day for 50 years    Types: Cigarettes    Quit date: 12/08/2006  . Smokeless tobacco: Never Used  . Alcohol Use: No  . Drug Use: No  . Sexual Activity: Not on file   Other Topics Concern  . Not on file   Social History Narrative  . No narrative on file    Current Outpatient Prescriptions on File Prior to Visit  Medication Sig Dispense Refill  . acetaminophen (TYLENOL) 500 MG tablet Take 500 mg by mouth every 6 (six) hours as needed for pain.      Marland Kitchen albuterol (PROVENTIL HFA;VENTOLIN HFA) 108 (90 BASE) MCG/ACT inhaler Inhale 2 puffs into the lungs every 6 (six)  hours as needed for wheezing.      . Ascorbic Acid (VITAMIN C) 500 MG tablet Take 500 mg by mouth daily.        Marland Kitchen aspirin 325 MG tablet Take 325 mg by mouth daily.      . B-D INS SYR ULTRAFINE 1CC/30G 30G X 1/2" 1 ML MISC USE FOR INSULIN TO TEST 4 TIMES DAILY  400 each  3  . brimonidine (ALPHAGAN P) 0.1 % SOLN Place 1 drop into the left eye every 8 (eight) hours.       . Cholecalciferol (VITAMIN D3) 400 UNITS CAPS Take 1,000 Units by mouth daily.       Marland Kitchen dextromethorphan-guaiFENesin (MUCINEX DM) 30-600 MG per 12 hr tablet Take 1 tablet by mouth as needed.       . dorzolamide-timolol (COSOPT) 22.3-6.8 MG/ML ophthalmic solution Place 1 drop into both eyes.       . fluticasone (FLONASE) 50 MCG/ACT nasal spray Place 1 spray into the nose daily. USE TWO SPRAY AS NEEDED      . glucose blood (ONE TOUCH ULTRA TEST) test strip Use  as instructed four times daily dx 250.43  360 each  3  . insulin regular human CONCENTRATED (HUMULIN R) 500 UNIT/ML SOLN injection 3 times a day (just before each meal) 4x a day (just before each meal) 300-300-300-125 units      . latanoprost (XALATAN) 0.005 % ophthalmic solution Place 1 drop into both eyes at bedtime.       Marland Kitchen levothyroxine (SYNTHROID, LEVOTHROID) 50 MCG tablet Take 50 mcg by mouth daily before breakfast.      . Multiple Vitamin (MULTIVITAMIN) capsule Take 1 capsule by mouth daily.      . OMEGA-3 1000 MG CAPS Take 1 capsule by mouth daily.       Marland Kitchen omeprazole (PRILOSEC) 20 MG capsule Take 1 capsule (20 mg total) by mouth daily.  30 capsule  4  . Pancrelipase, Lip-Prot-Amyl, 25000 UNITS CPEP Take 25,000 Units by mouth 3 (three) times daily.       . Probiotic Product (PROBIOTIC ACIDOPHILUS) CAPS Take by mouth.        . rosuvastatin (CRESTOR) 40 MG tablet 1/2 tab daily  30 tablet  3  . traMADol (ULTRAM) 50 MG tablet Take 2 tablets (100 mg total) by mouth every 6 (six) hours as needed for pain.  50 tablet  5  . warfarin (COUMADIN) 4 MG tablet Take as directed by  Anticoagulation clinic  50 tablet  2  . warfarin (COUMADIN) 6 MG tablet Take 6 mg by mouth daily. On Monday,tues,thursday,friday,sat and sundays      . mometasone (NASONEX) 50 MCG/ACT nasal spray Place 2 sprays into the nose daily.        No current facility-administered medications on file prior to visit.    Allergies  Allergen Reactions  . Pioglitazone     ACTOS  REACTION: Edema    Family History  Problem Relation Age of Onset  . Liver cancer Mother     deceased age 69  . Cancer Mother     liver cancer  . Colon cancer Neg Hx   . Heart attack Father     BP 124/74  Pulse 80  Temp(Src) 98 F (36.7 C) (Oral)  Resp 20  Ht 5\' 10"  (1.778 m)  Wt 317 lb 14.4 oz (144.198 kg)  BMI 45.61 kg/m2  Review of Systems He has insomnia.  Denies falls    Objective:   Physical Exam VITAL SIGNS:  See vs page GENERAL: no distress PSYCH: Alert and oriented x 3.  Does not appear anxious nor depressed.     Assessment & Plan:  Depression/anxiety: new Bladder cancer, new.  This dx appears to precipitate the depression Low-back pain: there is a drug-drug interaction between tramadol and xanax

## 2013-10-19 NOTE — Telephone Encounter (Signed)
please call patient: The avs you received did not have the most up to date insulin dosage.  It should be 3 times a day (just before each meal) 4x a day (just before each meal) 300-300-300-125 units.

## 2013-10-19 NOTE — Patient Instructions (Addendum)
i have sent a prescription to your pharmacy, for a pill against depression.  It takes a few weeks to work.  We can double this if necessary. Here is a pill, to take as needed, for anxiety.  There is an interaction between this and your pain pill.  Please use caution.   Please come back for a follow-up appointment in 1 month.

## 2013-10-19 NOTE — Telephone Encounter (Signed)
Selena Batten is faxing with Lori's office note

## 2013-10-20 ENCOUNTER — Other Ambulatory Visit: Payer: Self-pay | Admitting: Urology

## 2013-10-20 DIAGNOSIS — M545 Low back pain, unspecified: Secondary | ICD-10-CM | POA: Insufficient documentation

## 2013-10-20 DIAGNOSIS — F329 Major depressive disorder, single episode, unspecified: Secondary | ICD-10-CM | POA: Insufficient documentation

## 2013-10-21 NOTE — Telephone Encounter (Signed)
Lvm for pt to return call.

## 2013-10-24 ENCOUNTER — Encounter (HOSPITAL_COMMUNITY): Payer: Self-pay | Admitting: Pharmacy Technician

## 2013-10-24 ENCOUNTER — Encounter (HOSPITAL_COMMUNITY)
Admission: RE | Admit: 2013-10-24 | Discharge: 2013-10-24 | Disposition: A | Discharge status: Home / Self Care | Payer: Medicare Other | Source: Ambulatory Visit | Attending: Urology | Admitting: Urology

## 2013-10-24 ENCOUNTER — Encounter (HOSPITAL_COMMUNITY): Payer: Self-pay

## 2013-10-24 ENCOUNTER — Other Ambulatory Visit: Payer: Self-pay | Admitting: *Deleted

## 2013-10-24 ENCOUNTER — Ambulatory Visit (HOSPITAL_COMMUNITY)
Admission: RE | Admit: 2013-10-24 | Discharge: 2013-10-24 | Disposition: A | Discharge status: Home / Self Care | Payer: Medicare Other | Source: Ambulatory Visit | Attending: Urology | Admitting: Urology

## 2013-10-24 DIAGNOSIS — Z01818 Encounter for other preprocedural examination: Secondary | ICD-10-CM | POA: Insufficient documentation

## 2013-10-24 DIAGNOSIS — R0602 Shortness of breath: Secondary | ICD-10-CM | POA: Insufficient documentation

## 2013-10-24 DIAGNOSIS — E119 Type 2 diabetes mellitus without complications: Secondary | ICD-10-CM | POA: Insufficient documentation

## 2013-10-24 DIAGNOSIS — Z01812 Encounter for preprocedural laboratory examination: Secondary | ICD-10-CM | POA: Insufficient documentation

## 2013-10-24 DIAGNOSIS — M47814 Spondylosis without myelopathy or radiculopathy, thoracic region: Secondary | ICD-10-CM | POA: Insufficient documentation

## 2013-10-24 HISTORY — DX: Major depressive disorder, single episode, unspecified: F32.9

## 2013-10-24 HISTORY — DX: Depression, unspecified: F32.A

## 2013-10-24 HISTORY — DX: Polyneuropathy, unspecified: G62.9

## 2013-10-24 HISTORY — DX: Gastro-esophageal reflux disease without esophagitis: K21.9

## 2013-10-24 HISTORY — DX: Personal history of other infectious and parasitic diseases: Z86.19

## 2013-10-24 LAB — BASIC METABOLIC PANEL
BUN: 26 mg/dL — ABNORMAL HIGH (ref 6–23)
CO2: 22 mEq/L (ref 19–32)
Calcium: 9.6 mg/dL (ref 8.4–10.5)
Chloride: 102 mEq/L (ref 96–112)
Creatinine, Ser: 1.26 mg/dL (ref 0.50–1.35)
GFR calc Af Amer: 65 mL/min — ABNORMAL LOW (ref >90)
Sodium: 135 mEq/L (ref 135–145)

## 2013-10-24 LAB — PROTIME-INR
INR: 1.07 (ref 0.00–1.49)
Prothrombin Time: 13.7 seconds (ref 11.6–15.2)

## 2013-10-24 LAB — CBC
Hemoglobin: 13.9 g/dL (ref 13.0–17.0)
MCV: 94.1 fL (ref 78.0–100.0)
Platelets: 274 10*3/uL (ref 150–400)
RBC: 4.42 MIL/uL (ref 4.22–5.81)
RDW: 14.8 % (ref 11.5–15.5)
WBC: 13.4 10*3/uL — ABNORMAL HIGH (ref 4.0–10.5)

## 2013-10-24 LAB — SURGICAL PCR SCREEN
MRSA, PCR: POSITIVE — AB
Staphylococcus aureus: POSITIVE — AB

## 2013-10-24 MED ORDER — INSULIN REGULAR HUMAN (CONC) 500 UNIT/ML ~~LOC~~ SOLN: SUBCUTANEOUS | Status: DC

## 2013-10-24 NOTE — Telephone Encounter (Signed)
Called pt and advised him of the most up to date insulin dosage. It should be 4x a day (just before each meal) 300-300-300- at bedtime 125 units. Pt understood and asked [place] to send in a rx refill with the adjustments to his pharmacy. Advised pt we would.

## 2013-10-24 NOTE — Progress Notes (Signed)
Perioperative prescription for implanted cardiac device form on chart, EKG 06/27/13 on EPIC

## 2013-10-24 NOTE — Patient Instructions (Signed)
[name]  [birth year]   Your procedure is scheduled on: 10/26/13  Report to [facility] at 12:45 PM.  Call this number if you have problems the morning of surgery [phone]: [phone]   Remember: please bring inhaler on day of surgery   Do not eat food After Midnight, clear liquids from midnight until 9:15 am on 10/26/13 then nothing.     Take these medicines the morning of surgery with A SIP OF WATER: omeprazole, levothyroxin, venlafaxine, alphagan drops, dorzolamide- timolol drops, albuterol inhaler, fluticason, alprazolam if needed.   Do not wear jewelry, make-up or nail polish.  Do not wear lotions, powders, or perfumes. You may wear deodorant.  Do not shave 48 hours prior to surgery. Men may shave face and neck.  Do not bring valuables to the hospital.    Patients discharged the day of surgery will not be allowed to drive home.  Name and phone number of your driver: [name] [phone]     Please read over the following fact sheets that you were given: MRSA Information, clear liquids fact sheet [name] Sons, RN  pre op nurse call if needed [phone]    FAILURE TO FOLLOW THESE INSTRUCTIONS MAY RESULT IN CANCELLATION OF YOUR SURGERY   Patient Signature: ___________________________________________

## 2013-10-25 MED ORDER — GENTAMICIN SULFATE 40 MG/ML IJ SOLN
500.0000 mg | INTRAVENOUS | Status: AC
  Administered 2013-10-26: 500 mg via INTRAVENOUS
  Filled 2013-10-25: qty 12.5

## 2013-10-26 ENCOUNTER — Encounter (HOSPITAL_COMMUNITY)
Admission: RE | Disposition: A | Discharge status: Home / Self Care | Payer: Self-pay | Source: Ambulatory Visit | Attending: Internal Medicine

## 2013-10-26 ENCOUNTER — Ambulatory Visit (HOSPITAL_COMMUNITY): Payer: Medicare Other | Admitting: Certified Registered Nurse Anesthetist

## 2013-10-26 ENCOUNTER — Encounter (HOSPITAL_COMMUNITY): Payer: Medicare Other | Admitting: Certified Registered Nurse Anesthetist

## 2013-10-26 ENCOUNTER — Encounter (HOSPITAL_COMMUNITY): Payer: Self-pay | Admitting: *Deleted

## 2013-10-26 ENCOUNTER — Ambulatory Visit (HOSPITAL_COMMUNITY): Payer: Medicare Other

## 2013-10-26 ENCOUNTER — Inpatient Hospital Stay (HOSPITAL_COMMUNITY)
Admission: RE | Admit: 2013-10-26 | Discharge: 2013-10-30 | DRG: 668 | Disposition: A | Discharge status: Home / Self Care | Payer: Medicare Other | Source: Ambulatory Visit | Attending: Internal Medicine | Admitting: Internal Medicine

## 2013-10-26 DIAGNOSIS — Z950 Presence of cardiac pacemaker: Secondary | ICD-10-CM

## 2013-10-26 DIAGNOSIS — K219 Gastro-esophageal reflux disease without esophagitis: Secondary | ICD-10-CM | POA: Diagnosis present

## 2013-10-26 DIAGNOSIS — Q5564 Hidden penis: Secondary | ICD-10-CM

## 2013-10-26 DIAGNOSIS — H409 Unspecified glaucoma: Secondary | ICD-10-CM | POA: Diagnosis present

## 2013-10-26 DIAGNOSIS — E119 Type 2 diabetes mellitus without complications: Secondary | ICD-10-CM | POA: Diagnosis present

## 2013-10-26 DIAGNOSIS — J810 Acute pulmonary edema: Secondary | ICD-10-CM | POA: Diagnosis present

## 2013-10-26 DIAGNOSIS — F3289 Other specified depressive episodes: Secondary | ICD-10-CM | POA: Diagnosis present

## 2013-10-26 DIAGNOSIS — I509 Heart failure, unspecified: Secondary | ICD-10-CM | POA: Diagnosis present

## 2013-10-26 DIAGNOSIS — C679 Malignant neoplasm of bladder, unspecified: Principal | ICD-10-CM | POA: Diagnosis present

## 2013-10-26 DIAGNOSIS — J449 Chronic obstructive pulmonary disease, unspecified: Secondary | ICD-10-CM | POA: Diagnosis present

## 2013-10-26 DIAGNOSIS — Z87440 Personal history of urinary (tract) infections: Secondary | ICD-10-CM

## 2013-10-26 DIAGNOSIS — J9621 Acute and chronic respiratory failure with hypoxia: Secondary | ICD-10-CM | POA: Diagnosis present

## 2013-10-26 DIAGNOSIS — I10 Essential (primary) hypertension: Secondary | ICD-10-CM | POA: Diagnosis present

## 2013-10-26 DIAGNOSIS — J9600 Acute respiratory failure, unspecified whether with hypoxia or hypercapnia: Secondary | ICD-10-CM | POA: Diagnosis not present

## 2013-10-26 DIAGNOSIS — Z87891 Personal history of nicotine dependence: Secondary | ICD-10-CM

## 2013-10-26 DIAGNOSIS — I5032 Chronic diastolic (congestive) heart failure: Secondary | ICD-10-CM | POA: Diagnosis present

## 2013-10-26 DIAGNOSIS — E039 Hypothyroidism, unspecified: Secondary | ICD-10-CM | POA: Diagnosis present

## 2013-10-26 DIAGNOSIS — N200 Calculus of kidney: Secondary | ICD-10-CM | POA: Diagnosis present

## 2013-10-26 DIAGNOSIS — I4949 Other premature depolarization: Secondary | ICD-10-CM | POA: Diagnosis not present

## 2013-10-26 DIAGNOSIS — F411 Generalized anxiety disorder: Secondary | ICD-10-CM | POA: Diagnosis present

## 2013-10-26 DIAGNOSIS — M7989 Other specified soft tissue disorders: Secondary | ICD-10-CM | POA: Diagnosis present

## 2013-10-26 DIAGNOSIS — F329 Major depressive disorder, single episode, unspecified: Secondary | ICD-10-CM | POA: Diagnosis present

## 2013-10-26 DIAGNOSIS — Z6841 Body Mass Index (BMI) 40.0 and over, adult: Secondary | ICD-10-CM

## 2013-10-26 DIAGNOSIS — G4733 Obstructive sleep apnea (adult) (pediatric): Secondary | ICD-10-CM | POA: Diagnosis present

## 2013-10-26 DIAGNOSIS — J9601 Acute respiratory failure with hypoxia: Secondary | ICD-10-CM

## 2013-10-26 DIAGNOSIS — I1310 Hypertensive heart and chronic kidney disease without heart failure, with stage 1 through stage 4 chronic kidney disease, or unspecified chronic kidney disease: Secondary | ICD-10-CM | POA: Diagnosis present

## 2013-10-26 DIAGNOSIS — E785 Hyperlipidemia, unspecified: Secondary | ICD-10-CM | POA: Diagnosis present

## 2013-10-26 DIAGNOSIS — I2510 Atherosclerotic heart disease of native coronary artery without angina pectoris: Secondary | ICD-10-CM | POA: Diagnosis present

## 2013-10-26 DIAGNOSIS — I4891 Unspecified atrial fibrillation: Secondary | ICD-10-CM

## 2013-10-26 DIAGNOSIS — Z800 Family history of malignant neoplasm of digestive organs: Secondary | ICD-10-CM

## 2013-10-26 DIAGNOSIS — Z8249 Family history of ischemic heart disease and other diseases of the circulatory system: Secondary | ICD-10-CM

## 2013-10-26 DIAGNOSIS — J4489 Other specified chronic obstructive pulmonary disease: Secondary | ICD-10-CM | POA: Diagnosis present

## 2013-10-26 HISTORY — PX: TRANSURETHRAL RESECTION OF BLADDER TUMOR WITH GYRUS (TURBT-GYRUS): SHX6458

## 2013-10-26 HISTORY — PX: CYSTOSCOPY W/ URETERAL STENT PLACEMENT: SHX1429

## 2013-10-26 LAB — GLUCOSE, CAPILLARY: Glucose-Capillary: 209 mg/dL — ABNORMAL HIGH (ref 70–99)

## 2013-10-26 SURGERY — TRANSURETHRAL RESECTION OF BLADDER TUMOR WITH GYRUS (TURBT-GYRUS)
Anesthesia: General | Laterality: Right | Wound class: Clean Contaminated

## 2013-10-26 MED ORDER — PANTOPRAZOLE SODIUM 40 MG PO TBEC
40.0000 mg | DELAYED_RELEASE_TABLET | Freq: Every day | ORAL | Status: DC
  Administered 2013-10-27 – 2013-10-30 (×4): 40 mg via ORAL
  Filled 2013-10-26 (×4): qty 1

## 2013-10-26 MED ORDER — OXYCODONE HCL 5 MG PO TABS
5.0000 mg | ORAL_TABLET | ORAL | Status: DC | PRN
  Administered 2013-10-26 – 2013-10-29 (×7): 5 mg via ORAL
  Filled 2013-10-26 (×7): qty 1

## 2013-10-26 MED ORDER — MUPIROCIN 2 % EX OINT
1.0000 "application " | TOPICAL_OINTMENT | Freq: Two times a day (BID) | CUTANEOUS | Status: DC
  Administered 2013-10-26 – 2013-10-29 (×7): 1 via NASAL

## 2013-10-26 MED ORDER — DOCUSATE SODIUM 100 MG PO CAPS
100.0000 mg | ORAL_CAPSULE | Freq: Two times a day (BID) | ORAL | Status: DC
  Administered 2013-10-26 – 2013-10-30 (×8): 100 mg via ORAL
  Filled 2013-10-26 (×9): qty 1

## 2013-10-26 MED ORDER — ACETAMINOPHEN 500 MG PO TABS
1000.0000 mg | ORAL_TABLET | Freq: Four times a day (QID) | ORAL | Status: AC
  Administered 2013-10-26 – 2013-10-27 (×4): 1000 mg via ORAL
  Filled 2013-10-26 (×4): qty 2

## 2013-10-26 MED ORDER — METOCLOPRAMIDE HCL 5 MG/ML IJ SOLN
10.0000 mg | Freq: Three times a day (TID) | INTRAMUSCULAR | Status: DC
  Administered 2013-10-26 – 2013-10-30 (×11): 10 mg via INTRAVENOUS
  Filled 2013-10-26 (×14): qty 2

## 2013-10-26 MED ORDER — PROMETHAZINE HCL 25 MG/ML IJ SOLN: 6.2500 mg | INTRAMUSCULAR | Status: DC | PRN

## 2013-10-26 MED ORDER — SUCCINYLCHOLINE CHLORIDE 20 MG/ML IJ SOLN
INTRAMUSCULAR | Status: DC | PRN
  Administered 2013-10-26: 100 mg via INTRAVENOUS

## 2013-10-26 MED ORDER — ONDANSETRON HCL 4 MG/2ML IJ SOLN
INTRAMUSCULAR | Status: DC | PRN
  Administered 2013-10-26: 4 mg via INTRAVENOUS

## 2013-10-26 MED ORDER — ONDANSETRON HCL 4 MG/2ML IJ SOLN: 4.0000 mg | INTRAMUSCULAR | Status: DC | PRN

## 2013-10-26 MED ORDER — INSULIN ASPART 100 UNIT/ML ~~LOC~~ SOLN
0.0000 [IU] | Freq: Three times a day (TID) | SUBCUTANEOUS | Status: DC
  Administered 2013-10-27: 8 [IU] via SUBCUTANEOUS
  Administered 2013-10-27 (×2): 11 [IU] via SUBCUTANEOUS
  Administered 2013-10-28: 15 [IU] via SUBCUTANEOUS
  Administered 2013-10-29: 5 [IU] via SUBCUTANEOUS
  Administered 2013-10-29: 15 [IU] via SUBCUTANEOUS
  Administered 2013-10-30: 8 [IU] via SUBCUTANEOUS

## 2013-10-26 MED ORDER — INSULIN ASPART 100 UNIT/ML ~~LOC~~ SOLN
4.0000 [IU] | Freq: Three times a day (TID) | SUBCUTANEOUS | Status: DC
  Administered 2013-10-27 – 2013-10-28 (×4): 4 [IU] via SUBCUTANEOUS

## 2013-10-26 MED ORDER — FENTANYL CITRATE 0.05 MG/ML IJ SOLN
INTRAMUSCULAR | Status: DC | PRN
  Administered 2013-10-26 (×2): 50 ug via INTRAVENOUS

## 2013-10-26 MED ORDER — FENTANYL CITRATE 0.05 MG/ML IJ SOLN
25.0000 ug | INTRAMUSCULAR | Status: DC | PRN
  Administered 2013-10-26 (×3): 50 ug via INTRAVENOUS

## 2013-10-26 MED ORDER — GLYCOPYRROLATE 0.2 MG/ML IJ SOLN
INTRAMUSCULAR | Status: DC | PRN
  Administered 2013-10-26: 0.6 mg via INTRAVENOUS

## 2013-10-26 MED ORDER — FENTANYL CITRATE 0.05 MG/ML IJ SOLN
INTRAMUSCULAR | Status: AC
  Filled 2013-10-26: qty 2

## 2013-10-26 MED ORDER — METHYLENE BLUE 1 % INJ SOLN
INTRAMUSCULAR | Status: AC
  Filled 2013-10-26: qty 10

## 2013-10-26 MED ORDER — HYDROMORPHONE HCL PF 1 MG/ML IJ SOLN: 0.5000 mg | INTRAMUSCULAR | Status: DC | PRN

## 2013-10-26 MED ORDER — HYDROMORPHONE HCL PF 1 MG/ML IJ SOLN
0.5000 mg | INTRAMUSCULAR | Status: DC | PRN
  Administered 2013-10-26 – 2013-10-27 (×2): 1 mg via INTRAVENOUS
  Filled 2013-10-26 (×3): qty 1

## 2013-10-26 MED ORDER — 0.9 % SODIUM CHLORIDE (POUR BTL) OPTIME
TOPICAL | Status: DC | PRN
  Administered 2013-10-26: 1000 mL

## 2013-10-26 MED ORDER — SODIUM CHLORIDE 0.9 % IR SOLN
Status: DC | PRN
  Administered 2013-10-26: 3000 mL

## 2013-10-26 MED ORDER — GLYCOPYRROLATE 0.2 MG/ML IJ SOLN
INTRAMUSCULAR | Status: AC
  Filled 2013-10-26: qty 3

## 2013-10-26 MED ORDER — VENLAFAXINE HCL ER 150 MG PO CP24
150.0000 mg | ORAL_CAPSULE | Freq: Every day | ORAL | Status: DC
  Administered 2013-10-27 – 2013-10-30 (×4): 150 mg via ORAL
  Filled 2013-10-26 (×5): qty 1

## 2013-10-26 MED ORDER — LACTATED RINGERS IV SOLN
INTRAVENOUS | Status: DC
  Administered 2013-10-26: 1000 mL via INTRAVENOUS

## 2013-10-26 MED ORDER — LIDOCAINE HCL (CARDIAC) 20 MG/ML IV SOLN
INTRAVENOUS | Status: AC
  Filled 2013-10-26: qty 5

## 2013-10-26 MED ORDER — BRIMONIDINE TARTRATE 0.15 % OP SOLN
1.0000 [drp] | Freq: Three times a day (TID) | OPHTHALMIC | Status: DC
Start: 2013-10-26 — End: 2013-10-30
  Administered 2013-10-26 – 2013-10-30 (×10): 1 [drp] via OPHTHALMIC
  Filled 2013-10-26: qty 5

## 2013-10-26 MED ORDER — ALBUTEROL SULFATE HFA 108 (90 BASE) MCG/ACT IN AERS
2.0000 | INHALATION_SPRAY | Freq: Four times a day (QID) | RESPIRATORY_TRACT | Status: DC | PRN
  Administered 2013-10-27 (×2): 2 via RESPIRATORY_TRACT
  Filled 2013-10-26 (×2): qty 6.7

## 2013-10-26 MED ORDER — PHENYLEPHRINE 40 MCG/ML (10ML) SYRINGE FOR IV PUSH (FOR BLOOD PRESSURE SUPPORT)
PREFILLED_SYRINGE | INTRAVENOUS | Status: AC
  Filled 2013-10-26: qty 10

## 2013-10-26 MED ORDER — MUPIROCIN 2 % EX OINT
TOPICAL_OINTMENT | Freq: Two times a day (BID) | CUTANEOUS | Status: DC
  Administered 2013-10-26 – 2013-10-30 (×8): via NASAL
  Filled 2013-10-26: qty 22

## 2013-10-26 MED ORDER — ROCURONIUM BROMIDE 100 MG/10ML IV SOLN
INTRAVENOUS | Status: AC
  Filled 2013-10-26: qty 1

## 2013-10-26 MED ORDER — MIDAZOLAM HCL 2 MG/2ML IJ SOLN
INTRAMUSCULAR | Status: AC
  Filled 2013-10-26: qty 2

## 2013-10-26 MED ORDER — OXYCODONE-ACETAMINOPHEN 5-325 MG PO TABS: 1.0000 | ORAL_TABLET | ORAL | Status: DC | PRN

## 2013-10-26 MED ORDER — PROPOFOL 10 MG/ML IV BOLUS
INTRAVENOUS | Status: AC
  Filled 2013-10-26: qty 20

## 2013-10-26 MED ORDER — LATANOPROST 0.005 % OP SOLN
1.0000 [drp] | Freq: Every day | OPHTHALMIC | Status: DC
  Administered 2013-10-26 – 2013-10-29 (×4): 1 [drp] via OPHTHALMIC
  Filled 2013-10-26: qty 2.5

## 2013-10-26 MED ORDER — IOHEXOL 300 MG/ML  SOLN
INTRAMUSCULAR | Status: DC | PRN
  Administered 2013-10-26: 20 mL

## 2013-10-26 MED ORDER — DORZOLAMIDE HCL-TIMOLOL MAL 2-0.5 % OP SOLN
1.0000 [drp] | Freq: Two times a day (BID) | OPHTHALMIC | Status: DC
  Administered 2013-10-26 – 2013-10-30 (×8): 1 [drp] via OPHTHALMIC
  Filled 2013-10-26: qty 10

## 2013-10-26 MED ORDER — MUPIROCIN 2 % EX OINT
TOPICAL_OINTMENT | CUTANEOUS | Status: AC
  Filled 2013-10-26: qty 22

## 2013-10-26 MED ORDER — LEVOTHYROXINE SODIUM 50 MCG PO TABS
50.0000 ug | ORAL_TABLET | Freq: Every day | ORAL | Status: DC
  Administered 2013-10-27 – 2013-10-30 (×4): 50 ug via ORAL
  Filled 2013-10-26 (×5): qty 1

## 2013-10-26 MED ORDER — HYOSCYAMINE SULFATE 0.125 MG SL SUBL
0.1250 mg | SUBLINGUAL_TABLET | SUBLINGUAL | Status: DC | PRN
  Administered 2013-10-27 – 2013-10-28 (×3): 0.125 mg via ORAL
  Filled 2013-10-26 (×3): qty 1

## 2013-10-26 MED ORDER — SENNOSIDES-DOCUSATE SODIUM 8.6-50 MG PO TABS: 1.0000 | ORAL_TABLET | Freq: Two times a day (BID) | ORAL | Status: DC

## 2013-10-26 MED ORDER — NEOSTIGMINE METHYLSULFATE 1 MG/ML IJ SOLN
INTRAMUSCULAR | Status: AC
  Filled 2013-10-26: qty 10

## 2013-10-26 MED ORDER — SENNA 8.6 MG PO TABS
1.0000 | ORAL_TABLET | Freq: Two times a day (BID) | ORAL | Status: DC
  Administered 2013-10-26 – 2013-10-30 (×8): 8.6 mg via ORAL
  Filled 2013-10-26 (×8): qty 1

## 2013-10-26 MED ORDER — MEPERIDINE HCL 50 MG/ML IJ SOLN: 6.2500 mg | INTRAMUSCULAR | Status: DC | PRN

## 2013-10-26 MED ORDER — SODIUM CHLORIDE 0.9 % IR SOLN
Status: DC | PRN
  Administered 2013-10-26: 6000 mL

## 2013-10-26 MED ORDER — ONDANSETRON HCL 4 MG/2ML IJ SOLN
INTRAMUSCULAR | Status: AC
  Filled 2013-10-26: qty 2

## 2013-10-26 MED ORDER — POTASSIUM CHLORIDE IN NACL 20-0.9 MEQ/L-% IV SOLN
INTRAVENOUS | Status: DC
  Administered 2013-10-26: 21:00:00 via INTRAVENOUS
  Filled 2013-10-26 (×2): qty 1000

## 2013-10-26 MED ORDER — ROCURONIUM BROMIDE 100 MG/10ML IV SOLN
INTRAVENOUS | Status: DC | PRN
  Administered 2013-10-26: 15 mg via INTRAVENOUS
  Administered 2013-10-26: 35 mg via INTRAVENOUS

## 2013-10-26 MED ORDER — CHLORHEXIDINE GLUCONATE CLOTH 2 % EX PADS
6.0000 | MEDICATED_PAD | Freq: Every day | CUTANEOUS | Status: DC
  Administered 2013-10-28 – 2013-10-30 (×3): 6 via TOPICAL

## 2013-10-26 MED ORDER — MIDAZOLAM HCL 5 MG/5ML IJ SOLN
INTRAMUSCULAR | Status: DC | PRN
  Administered 2013-10-26: 2 mg via INTRAVENOUS

## 2013-10-26 MED ORDER — LIDOCAINE HCL (CARDIAC) 20 MG/ML IV SOLN
INTRAVENOUS | Status: DC | PRN
  Administered 2013-10-26: 100 mg via INTRAVENOUS

## 2013-10-26 MED ORDER — PHENYLEPHRINE HCL 10 MG/ML IJ SOLN
INTRAMUSCULAR | Status: DC | PRN
  Administered 2013-10-26: 80 ug via INTRAVENOUS

## 2013-10-26 MED ORDER — ATORVASTATIN CALCIUM 80 MG PO TABS
80.0000 mg | ORAL_TABLET | Freq: Every day | ORAL | Status: DC
  Administered 2013-10-27 – 2013-10-29 (×3): 80 mg via ORAL
  Filled 2013-10-26 (×4): qty 1

## 2013-10-26 MED ORDER — SULFAMETHOXAZOLE-TMP DS 800-160 MG PO TABS: 1.0000 | ORAL_TABLET | Freq: Two times a day (BID) | ORAL | Status: DC

## 2013-10-26 MED ORDER — PROPOFOL 10 MG/ML IV BOLUS
INTRAVENOUS | Status: DC | PRN
  Administered 2013-10-26: 180 mg via INTRAVENOUS

## 2013-10-26 MED ORDER — NEOSTIGMINE METHYLSULFATE 1 MG/ML IJ SOLN
INTRAMUSCULAR | Status: DC | PRN
  Administered 2013-10-26: 5 mg via INTRAVENOUS

## 2013-10-26 SURGICAL SUPPLY — 23 items
BAG URINE DRAINAGE (UROLOGICAL SUPPLIES) ×3 IMPLANT
BAG URO CATCHER STRL LF (DRAPE) ×3 IMPLANT
CATH FOLEY 3WAY 30CC 22FR (CATHETERS) ×3 IMPLANT
CATH INTERMIT  6FR 70CM (CATHETERS) ×3 IMPLANT
DRAPE CAMERA CLOSED 9X96 (DRAPES) ×6 IMPLANT
ELECT BUTTON HF 24-28F 2 30DE (ELECTRODE) IMPLANT
ELECT LOOP MED HF 24F 12D (CUTTING LOOP) ×3 IMPLANT
ELECT LOOP MED HF 24F 12D CBL (CLIP) IMPLANT
ELECT RESECT VAPORIZE 12D CBL (ELECTRODE) IMPLANT
GLOVE BIOGEL M STRL SZ7.5 (GLOVE) ×3 IMPLANT
GOWN STRL REIN XL XLG (GOWN DISPOSABLE) ×3 IMPLANT
GUIDEWIRE ANG ZIPWIRE 038X150 (WIRE) ×3 IMPLANT
HOLDER FOLEY CATH W/STRAP (MISCELLANEOUS) ×3 IMPLANT
IV NS IRRIG 3000ML ARTHROMATIC (IV SOLUTION) IMPLANT
KIT ASPIRATION TUBING (SET/KITS/TRAYS/PACK) IMPLANT
MANIFOLD NEPTUNE II (INSTRUMENTS) ×3 IMPLANT
PACK CYSTO (CUSTOM PROCEDURE TRAY) ×3 IMPLANT
PLUG CATH AND CAP STER (CATHETERS) IMPLANT
STENT CONTOUR 6FRX26X.038 (STENTS) ×3 IMPLANT
SUT ETHILON 3 0 PS 1 (SUTURE) IMPLANT
SYR 30ML LL (SYRINGE) IMPLANT
SYRINGE IRR TOOMEY STRL 70CC (SYRINGE) ×3 IMPLANT
TUBING CONNECTING 10 (TUBING) ×3 IMPLANT

## 2013-10-26 NOTE — Progress Notes (Signed)
PACU Continues to have urgency "I just feel full. I don't fell like I can go home feeling like this."  Has had Fentanyl in divided doses with minimal relief. Cath draining pink urine without clots. Dr. Berneice Heinrich aware. Will write orders.

## 2013-10-26 NOTE — Anesthesia Postprocedure Evaluation (Signed)
  Anesthesia Post-op Note  Patient: [name]  Procedure(s) Performed: Procedure(s) (LRB): TRANSURETHRAL RESECTION OF BLADDER TUMOR WITH GYRUS (TURBT-GYRUS) (N/A) CYSTOSCOPY WITH RETROGRADE PYELOGRAM/URETERAL STENT PLACEMENT (Right)  Patient Location: PACU  Anesthesia Type: General  Level of Consciousness: awake and alert   Airway and Oxygen Therapy: Patient Spontanous Breathing  Post-op Pain: mild  Post-op Assessment: Post-op Vital signs reviewed, Patient's Cardiovascular Status Stable, Respiratory Function Stable, Patent Airway and No signs of Nausea or vomiting  Last Vitals:  Filed Vitals:   10/26/13 1800  BP: 125/52  Pulse: 60  Temp:   Resp: 13    Post-op Vital Signs: stable   Complications: No apparent anesthesia complications

## 2013-10-26 NOTE — Preoperative (Signed)
Beta Blockers   Reason not to administer Beta Blockers:Not Applicable 

## 2013-10-26 NOTE — Anesthesia Preprocedure Evaluation (Signed)
Anesthesia Evaluation  Patient identified by MRN, date of birth, ID band Patient awake    Reviewed: Allergy & Precautions, H&P , NPO status , Patient's Chart, lab work & pertinent test results  Airway Mallampati: III TM Distance: >3 FB Neck ROM: Full    Dental no notable dental hx.    Pulmonary shortness of breath and with exertion, sleep apnea , COPDformer smoker,  breath sounds clear to auscultation  Pulmonary exam normal       Cardiovascular hypertension, Pt. on medications + CAD + dysrhythmias Atrial Fibrillation + pacemaker Rhythm:Regular Rate:Normal     Neuro/Psych negative neurological ROS  negative psych ROS   GI/Hepatic Neg liver ROS, GERD-  Medicated and Controlled,  Endo/Other  diabetes, Type 2, Insulin Dependent  Renal/GU Renal InsufficiencyRenal disease  negative genitourinary   Musculoskeletal negative musculoskeletal ROS (+)   Abdominal   Peds negative pediatric ROS (+)  Hematology negative hematology ROS (+)   Anesthesia Other Findings   Reproductive/Obstetrics negative OB ROS                           Anesthesia Physical Anesthesia Plan  ASA: III  Anesthesia Plan: General   Post-op Pain Management:    Induction: Intravenous  Airway Management Planned: Oral ETT  Additional Equipment:   Intra-op Plan:   Post-operative Plan: Extubation in OR  Informed Consent: I have reviewed the patients History and Physical, chart, labs and discussed the procedure including the risks, benefits and alternatives for the proposed anesthesia with the patient or authorized representative who has indicated his/her understanding and acceptance.   Dental advisory given  Plan Discussed with: CRNA  Anesthesia Plan Comments:         Anesthesia Quick Evaluation

## 2013-10-26 NOTE — Brief Op Note (Signed)
10/26/2013  4:34 PM  PATIENT:  [name]  72 y.o. male  PRE-OPERATIVE DIAGNOSIS:  BLADDER CANCER  POST-OPERATIVE DIAGNOSIS:  BLADDER CANCER  PROCEDURE:  Procedure(s): TRANSURETHRAL RESECTION OF BLADDER TUMOR WITH GYRUS (TURBT-GYRUS) (N/A) CYSTOSCOPY WITH RETROGRADE PYELOGRAM/URETERAL STENT PLACEMENT (Right)  SURGEON:  Surgeon(s) and Role:    * Sebastian Ache, MD - Primary  PHYSICIAN ASSISTANT:   ASSISTANTS: none   ANESTHESIA:   general  EBL:     BLOOD ADMINISTERED:none  DRAINS: 42F 3 way foley, irrigation port plugged   LOCAL MEDICATIONS USED:  NONE  SPECIMEN:  Source of Specimen:  1 - Massive Bladder Tumor, 2 - Base of Bladder Tumor and Rt Ureteral Orifice  DISPOSITION OF SPECIMEN:  PATHOLOGY  COUNTS:  YES  TOURNIQUET:  * No tourniquets in log *  DICTATION: .Other Dictation: Dictation Number  [id number]  PLAN OF CARE: Discharge to home after PACU  PATIENT DISPOSITION:  PACU - hemodynamically stable.   Delay start of Pharmacological VTE agent (>24hrs) due to surgical blood loss or risk of bleeding: yes

## 2013-10-26 NOTE — Transfer of Care (Signed)
Immediate Anesthesia Transfer of Care Note  Patient: [name]  Procedure(s) Performed: Procedure(s) (LRB): TRANSURETHRAL RESECTION OF BLADDER TUMOR WITH GYRUS (TURBT-GYRUS) (N/A) CYSTOSCOPY WITH RETROGRADE PYELOGRAM/URETERAL STENT PLACEMENT (Right)  Patient Location: PACU  Anesthesia Type: General  Level of Consciousness: sedated, patient cooperative and responds to stimulation  Airway & Oxygen Therapy: Patient Spontanous Breathing and Patient connected to face mask oxgen  Post-op Assessment: Report given to PACU RN and Post -op Vital signs reviewed and stable  Post vital signs: Reviewed and stable  Complications: No apparent anesthesia complications

## 2013-10-26 NOTE — H&P (Signed)
[name] is an 72 y.o. male.    Chief Complaint: Pre-op Transurethral REsection Bladder Tumor  HPI:    1 - Recurrent UTI - Pt wtih UTI about every other year, includign 09/2013. UCX's e. coli and streptoccus. One pyelo episode in 2008. He is diabetic wtih significant glycosuria.   2 - Nephrolithiasis - Two prior episodes of colic managed medically around 1980 and 1990.  Imagign 2012 with only bilateral punctate stones.   3 - Prostate Screening - No FHX prostate cancer. PSA 2014 0.62 (at pt age 49), DRE 60gm smooth.  4 - Buried Penis - Pt voids seated as unable to find penis routienly 2/2 morbid obesity. No phimosis.  5 - Gross Hematuria - Pt with new gross hematuria 09/2013 prompting ER CT which revealed ? bladder trigone mass v. blood clot. Had strep UTI at time. Cysto 10/2013 with bladder cancer.  6 - Bladder Cancer - Newly diagnosed large volume papillayr bladder cancer at trigone by cysto 10/2013. CT from ER with suspicious left ext. iliac node 1.5cm.   PMH sig for morbid obesity, DM (A1C >8, with LE neuropathy), OSA, AFib/DVT/Coumadin.   Today [name] is seen to proceed with transurethral resection of his bladder turmor for diagnsotic and staging purposes. Most recetn UCX negative. No interval fevers.  Past Medical History  Diagnosis Date  . Diabetes mellitus   . COPD (chronic obstructive pulmonary disease)     CPAP  . Renal insufficiency   . Hyperlipemia   . Other and unspecified coagulation defects   . Hypothyroidism   . Hyperkalemia   . 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 Sick sinus syndrome     s/p PPM by JA  . Pancreatitis   . Obesity   . Constipation   . OSA on CPAP     using CPAP although it is hard for him to tolerate  . SOB (shortness of breath)   . CAD (coronary artery disease)     not much  . Anginal pain     at times  . Hypertension     occasional  . Pacemaker   . Depression     recently  . Anxiety   . H/O Legionnaire's disease 2003   . History of blood clots     R groin  . Peripheral neuropathy   . Osteoarthritis     fingers  . GERD (gastroesophageal reflux disease)   . Cancer 2014    bladder cancer  . Anemia     hx of  . Pneumonia 2003    Past Surgical History  Procedure Laterality Date  . Nasal septum surgery  1967  . Pacemaker placement  06/2010    [facility] Accent RF DR, Model [phone] ( Serial number [id number])  . Thromboembolectomy and four compartment fasciotomy  2009  . Cardiac catheterization    . Cataract extraction Right   . Cholecystectomy  2012  . Insert / replace / remove pacemaker  2011    Family History  Problem Relation Age of Onset  . Liver cancer Mother     deceased age 70  . Cancer Mother     liver cancer  . Colon cancer Neg Hx   . Heart attack Father    Social History:  reports that he quit smoking about 6 years ago. His smoking use included Cigarettes. He has a 100 pack-year smoking history. He has never used smokeless tobacco. He reports that  he does not drink alcohol or use illicit drugs.  Allergies:  Allergies  Allergen Reactions  . Pioglitazone     ACTOS  REACTION: Edema    No prescriptions prior to admission    Results for orders placed during the hospital encounter of 10/24/13 (from the past 48 hour(s))  SURGICAL PCR SCREEN     Status: Abnormal   Collection Time    10/24/13  3:23 PM      Result Value Range   MRSA, PCR POSITIVE (*) NEGATIVE   Staphylococcus aureus POSITIVE (*) NEGATIVE   Comment:            The Xpert SA Assay (FDA     approved for NASAL specimens     in patients over 62 years of age),     is one component of     a comprehensive surveillance     program.  Test performance has     been validated by [employer] for patients greater     than or equal to 62 year old.     It is not intended     to diagnose infection nor to     guide or monitor treatment.  CBC     Status: Abnormal   Collection Time    10/24/13  3:25 PM      Result  Value Range   WBC 13.4 (*) 4.0 - 10.5 K/uL   RBC 4.42  4.22 - 5.81 MIL/uL   Hemoglobin 13.9  13.0 - 17.0 g/dL   HCT 08.6  57.8 - 46.9 %   MCV 94.1  78.0 - 100.0 fL   MCH 31.4  26.0 - 34.0 pg   MCHC 33.4  30.0 - 36.0 g/dL   RDW 62.9  52.8 - 41.3 %   Platelets 274  150 - 400 K/uL  BASIC METABOLIC PANEL     Status: Abnormal   Collection Time    10/24/13  3:25 PM      Result Value Range   Sodium 135  135 - 145 mEq/L   Potassium 4.0  3.5 - 5.1 mEq/L   Chloride 102  96 - 112 mEq/L   CO2 22  19 - 32 mEq/L   Glucose, Bld 204 (*) 70 - 99 mg/dL   BUN 26 (*) 6 - 23 mg/dL   Creatinine, Ser 2.44  0.50 - 1.35 mg/dL   Calcium 9.6  8.4 - 01.0 mg/dL   GFR calc non Af Amer 56 (*) >90 mL/min   GFR calc Af Amer 65 (*) >90 mL/min   Comment: (NOTE)     The eGFR has been calculated using the CKD EPI equation.     This calculation has not been validated in all clinical situations.     eGFR's persistently <90 mL/min signify possible Chronic Kidney     Disease.  PROTIME-INR     Status: None   Collection Time    10/24/13  3:25 PM      Result Value Range   Prothrombin Time 13.7  11.6 - 15.2 seconds   INR 1.07  0.00 - 1.49  APTT     Status: None   Collection Time    10/24/13  3:25 PM      Result Value Range   aPTT 31  24 - 37 seconds   Dg Chest 2 View  10/25/2013   CLINICAL DATA:  Preoperative for tumor removal from urinary bladder. Shortness of breath. Diabetes.  EXAM: CHEST  2 VIEW  COMPARISON:  10/01/2013; 06/03/2011  FINDINGS: Dual lead pacer noted. Mitral calcification. Upper normal heart size.  Thoracic spondylosis noted.  No acute findings.  IMPRESSION: 1. Stable appearance of the chest, with dual lead pacer, thoracic spondylosis, and no acute findings.   Electronically Signed   By: Herbie Baltimore M.D.   On: 10/25/2013 00:07    Review of Systems  Constitutional: Negative.  Negative for fever and chills.  HENT: Negative.   Eyes: Negative.   Respiratory: Negative.   Cardiovascular:  Negative.   Gastrointestinal: Negative.   Genitourinary: Positive for hematuria.  Musculoskeletal: Negative.   Skin: Negative.   Neurological: Negative.   Endo/Heme/Allergies: Negative.   Psychiatric/Behavioral: Negative.     There were no vitals taken for this visit. Physical Exam  Constitutional: He is oriented to person, place, and time. He appears well-developed and well-nourished.  HENT:  Head: Normocephalic and atraumatic.  Eyes: EOM are normal. Pupils are equal, round, and reactive to light.  Neck: Normal range of motion. Neck supple.  Cardiovascular: Normal rate.   Respiratory: Effort normal.  GI: Soft.  Genitourinary: Penis normal.  No CVAT  Musculoskeletal: Normal range of motion.  Neurological: He is alert and oriented to person, place, and time.  Skin: Skin is warm and dry.  Psychiatric: He has a normal mood and affect. His behavior is normal. Judgment and thought content normal.     Assessment/Plan    1 - Recurrent UTI - I again explained that his diabetes is likely his biggest risk factor for recurrent infections given sig glycosuria. No hydro or stones by CT.   2 - Nephrolithiasis - stone free by CT 2014.  3 - Prostate Screening - up to date this year. At age 5 and with his level of comorbidity I would NOT recomend future screening.  4 - Buried Penis - not interfearing wtih voiding, reinforced need for good hygein, which he is maintianing.   5 - Gross Hematuria - Eval wtih cysto, CT reveals bladder cancer as per below.  6 - Bladder Cancer - Large voluem trigone papillary tumor. CT with worrisoem pelvic adenopathy. Needs operative transurethral resection as next step for tissue diagnosis and staging.    Were discussed operative biopsy / transurethral resection as the best next step for diagnostic and therapeutic purposes with goals being to remove all visible cancer and obtain tissue for pathologic exam. We rediscussed that for some low-grade tumors, this may  be all the treatment required, but that for many other tumors such as high-grade lesions, further therapy including surgery and or chemotherapy may be warranted. We also reoutlined the fact that any bladder cancer diagnosis will require close follow-up with periodic upper and lower tract evaluation. We rediscussed risks including bleeding, infection, damage to kidney / ureter / bladder including bladder perforation which can typically managed with prolonged foley catheterization. We rementioned anesthetic and other rare risks including DVT, PE, MI, and mortality. I also mentioned that adjunctive procedures such as ureteral stenting, retrograde pyelography, and ureteroscopy may be necessary to fully evaluate the urinary tract depending on intra-operative findings. After answering all questions to the patient's satisfaction, they wish to proceed pending medical clearance.    [name] 10/26/2013, 6:56 AM

## 2013-10-27 ENCOUNTER — Encounter (HOSPITAL_COMMUNITY): Payer: Self-pay | Admitting: Urology

## 2013-10-27 ENCOUNTER — Observation Stay (HOSPITAL_COMMUNITY): Payer: Medicare Other

## 2013-10-27 DIAGNOSIS — E119 Type 2 diabetes mellitus without complications: Secondary | ICD-10-CM

## 2013-10-27 DIAGNOSIS — I4891 Unspecified atrial fibrillation: Secondary | ICD-10-CM

## 2013-10-27 DIAGNOSIS — J9621 Acute and chronic respiratory failure with hypoxia: Secondary | ICD-10-CM | POA: Diagnosis present

## 2013-10-27 DIAGNOSIS — J810 Acute pulmonary edema: Secondary | ICD-10-CM

## 2013-10-27 DIAGNOSIS — J9600 Acute respiratory failure, unspecified whether with hypoxia or hypercapnia: Secondary | ICD-10-CM

## 2013-10-27 DIAGNOSIS — I359 Nonrheumatic aortic valve disorder, unspecified: Secondary | ICD-10-CM

## 2013-10-27 LAB — BASIC METABOLIC PANEL
BUN: 21 mg/dL (ref 6–23)
CO2: 24 mEq/L (ref 19–32)
Calcium: 8.7 mg/dL (ref 8.4–10.5)
Chloride: 99 mEq/L (ref 96–112)
Creatinine, Ser: 1.27 mg/dL (ref 0.50–1.35)
GFR calc Af Amer: 64 mL/min — ABNORMAL LOW (ref >90)
Glucose, Bld: 307 mg/dL — ABNORMAL HIGH (ref 70–99)
Potassium: 4.6 mEq/L (ref 3.5–5.1)

## 2013-10-27 LAB — GLUCOSE, CAPILLARY: Glucose-Capillary: 279 mg/dL — ABNORMAL HIGH (ref 70–99)

## 2013-10-27 LAB — CBC
HCT: 40.6 % (ref 39.0–52.0)
Hemoglobin: 12.8 g/dL — ABNORMAL LOW (ref 13.0–17.0)
MCH: 30.3 pg (ref 26.0–34.0)
MCHC: 31.5 g/dL (ref 30.0–36.0)
WBC: 13.7 10*3/uL — ABNORMAL HIGH (ref 4.0–10.5)

## 2013-10-27 MED ORDER — INSULIN GLARGINE 100 UNIT/ML ~~LOC~~ SOLN
10.0000 [IU] | Freq: Every day | SUBCUTANEOUS | Status: DC
  Administered 2013-10-27: 10 [IU] via SUBCUTANEOUS
  Filled 2013-10-27: qty 0.1

## 2013-10-27 MED ORDER — GUAIFENESIN ER 600 MG PO TB12
600.0000 mg | ORAL_TABLET | Freq: Two times a day (BID) | ORAL | Status: DC | PRN
  Administered 2013-10-28 – 2013-10-30 (×2): 600 mg via ORAL
  Filled 2013-10-27: qty 1

## 2013-10-27 MED ORDER — PANCRELIPASE (LIP-PROT-AMYL) 25000 UNITS PO CPEP: 25000.0000 [IU] | ORAL_CAPSULE | Freq: Three times a day (TID) | ORAL | Status: DC

## 2013-10-27 MED ORDER — GLUCERNA SHAKE PO LIQD
237.0000 mL | Freq: Two times a day (BID) | ORAL | Status: DC
  Administered 2013-10-27 – 2013-10-30 (×6): 237 mL via ORAL
  Filled 2013-10-27 (×7): qty 237

## 2013-10-27 MED ORDER — FUROSEMIDE 10 MG/ML IJ SOLN
40.0000 mg | Freq: Once | INTRAMUSCULAR | Status: AC
  Administered 2013-10-27: 40 mg via INTRAVENOUS
  Filled 2013-10-27: qty 4

## 2013-10-27 MED ORDER — ALBUTEROL SULFATE HFA 108 (90 BASE) MCG/ACT IN AERS
2.0000 | INHALATION_SPRAY | Freq: Four times a day (QID) | RESPIRATORY_TRACT | Status: DC
  Administered 2013-10-27 – 2013-10-29 (×7): 2 via RESPIRATORY_TRACT
  Filled 2013-10-27: qty 6.7

## 2013-10-27 MED ORDER — FUROSEMIDE 10 MG/ML IJ SOLN
60.0000 mg | Freq: Two times a day (BID) | INTRAMUSCULAR | Status: DC
  Administered 2013-10-27 – 2013-10-28 (×2): 60 mg via INTRAVENOUS
  Filled 2013-10-27 (×5): qty 6

## 2013-10-27 MED ORDER — GUAIFENESIN ER 600 MG PO TB12
600.0000 mg | ORAL_TABLET | Freq: Two times a day (BID) | ORAL | Status: DC
  Administered 2013-10-27 – 2013-10-29 (×5): 600 mg via ORAL
  Filled 2013-10-27 (×8): qty 1

## 2013-10-27 MED ORDER — PANCRELIPASE (LIP-PROT-AMYL) 12000-38000 UNITS PO CPEP
2.0000 | ORAL_CAPSULE | Freq: Three times a day (TID) | ORAL | Status: DC
  Administered 2013-10-27 – 2013-10-30 (×8): 2 via ORAL
  Filled 2013-10-27 (×11): qty 2

## 2013-10-27 MED ORDER — OXYBUTYNIN CHLORIDE ER 10 MG PO TB24
10.0000 mg | ORAL_TABLET | Freq: Once | ORAL | Status: AC
  Administered 2013-10-27: 10 mg via ORAL
  Filled 2013-10-27: qty 1

## 2013-10-27 MED ORDER — ALPRAZOLAM 0.25 MG PO TABS
0.2500 mg | ORAL_TABLET | Freq: Three times a day (TID) | ORAL | Status: DC | PRN
  Administered 2013-10-27 – 2013-10-29 (×3): 0.25 mg via ORAL
  Filled 2013-10-27 (×3): qty 1

## 2013-10-27 NOTE — Progress Notes (Signed)
Inpatient Diabetes Program Recommendations  AACE/ADA: New Consensus Statement on Inpatient Glycemic Control (2013)  Target Ranges:  Prepandial:   less than 140 mg/dL      Peak postprandial:   less than 180 mg/dL (1-2 hours)      Critically ill patients:  140 - 180 mg/dL   Reason for Visit: Hyperglycemia  Pt sees Dr. Everardo All for diabetes management.  On U-500 insulin at home. (60 units tid and 25 units QHS) This equates to 300 units tidwc and 125 units QHS if using regular insulin. Has had hypoglycemia (usually between MN - 0200) 4 times in the past month.  Dr. Everardo All has recently made adjustments to insulin regimen.  Checks blood sugars bid at home. Pt states he has not eaten in last 2 days.  No appetite and "I just feel miserable." Encouraged pt to try some liquids and he agreed to eat some jello.  Results for DURANT, SCIBILIA (MRN [id number]) as of 10/27/2013 14:47  Ref. Range 10/26/2013 13:04 10/26/2013 21:25 10/27/2013 12:01  Glucose-Capillary Latest Range: 70-99 mg/dL 409 (H) 811 (H) 914 (H)  Results for ALVINO, LECHUGA (MRN [id number]) as of 10/27/2013 14:47  Ref. Range 10/27/2013 14:05  Sodium Latest Range: 135-145 mEq/L 133 (L)  Potassium Latest Range: 3.5-5.1 mEq/L 4.6  Chloride Latest Range: 96-112 mEq/L 99  CO2 Latest Range: 19-32 mEq/L 24  BUN Latest Range: 6-23 mg/dL 21  Creatinine Latest Range: 0.50-1.35 mg/dL 0.86  Calcium Latest Range: 8.4-10.5 mg/dL 8.7  GFR calc non Af Amer Latest Range: >90 mL/min 56 (L)  GFR calc Af Amer Latest Range: >90 mL/min 64 (L)  Glucose Latest Range: 70-99 mg/dL 578 (H)  Results for CAELEN, REIERSON (MRN [id number]) as of 10/27/2013 14:47  Ref. Range 09/26/2013 11:23  Hemoglobin A1C Latest Range: 4.6-6.5 % 7.7 (H)  Needs basal insulin on board.  Since admission on 11/19, pt has received 23 units of Novolog insulin.  Inpatient Diabetes Program Recommendations Insulin - Basal: Start basal insulin - Lantus 40 units bid Correction (SSI): Increase  to resistant Q4 since pt is not eating  Insulin - Meal Coverage: Add meal coverage insulin when pt eating >50% meal - Novolog 10 units tidwc HgbA1C: 7.7%  Weight x .6/kg =86 units for TDD.  Will continue to follow. Discussed with RN and MD. Thank you. Ailene Ards, RD, LDN, CDE Inpatient Diabetes Coordinator [phone]

## 2013-10-27 NOTE — Op Note (Signed)
[name], [name] NO.:  [id number]  MEDICAL RECORD NO.:  [id number]  LOCATION:  1421                         FACILITY:  [facility]  PHYSICIAN:  Sebastian Ache, MD     DATE OF BIRTH:  03-04-1941  DATE OF PROCEDURE: 10/26/2013 DATE OF DISCHARGE:                              OPERATIVE REPORT   DIAGNOSIS:  Massive bladder cancer.  PROCEDURE: 1. Transurethral resection of bladder tumor, volume large. 2. Bilateral retrograde pyelogram with interpretation. 3. Right ureteral stent placement 6 x 26, no tether.  FINDINGS: 1. Massive papillary bladder tumor emanating from an area medial to     and involving the right ureteral orifice. 2. Unremarkable bilateral retrograde pyelograms. 3. Apparent tumor extending up the intramural right ureter unable to     completely resect.  SPECIMEN: 1. Bladder tumor. 2. Base of bladder tumor and right ureteral orifice.  ESTIMATED BLOOD LOSS:  50 mL.  DRAINS:  Twenty two French 3 Foley catheter straight drain.  Irrigation port plugged.  INDICATION:  [name] is a morbidly obese 72 year old gentleman with history of multiple medical comorbidities including atrial fibrillation, chronic anticoagulation, congestive heart failure, DVT, venostasis disease who was found on workup of gross hematuria to have a massive amount of bladder tumor.  He also had several areas of pelvic lymphadenopathy worrisome for possible metastatic disease.  An initial means of management of operative endoscopic exam via transurethral resection for diagnostic and therapeutic purposes was indicated and the patient wished to proceed.  Informed consent was then placed in medical record.  He had cardiac clearance.  He has been off his blood thinners several days prior.  PROCEDURE IN DETAIL:  The patient being [name] was verified. Procedure being transection of bladder tumor was confirmed.  Procedure was carried out.  Time-out was performed.  Intravenous  antibiotics administered.  General endotracheal anesthesia was introduced.  Patient was placed into a low lithotomy position.  Sterile field was created by prepping and draping the patient's penis, perineum, proximal thighs using iodine x3.  Next, cystourethroscopy was performed using a 22- French rigid cystoscope with 12-degree offset lens.  Inspection of the anterior and posterior urethra are unremarkable.  Inspection of bladder revealed a massive amount of bladder tumor emanating from the stalk encompassing the right ureteral orifice and an area medial to this.  The left ureteral orifice is unremarkable.  There were no other foci of tumor noted.  The cystoscope was then exchanged to 26-French ACMI continuous flow resectoscope sheath using visual obturator, which was then exchanged for the resectoscope loop using bipolar energy and resection under saline, very careful systematic resection was performed of the entire bladder tumor taking it down flushed to the posterior wall of the bladder and area of the right ureteral orifice.  These fragments were set aside labeled bladder tumor.  Next, additional swipes were taken of the deep aspect and directly into the right ureteral orifice as there was gross tumor at this location.  Great care was taken to only use cutting current at this location.  These fragments were set aside and labelled B for bladder tumor and right ureteral orifice.  Attention was then directed  at the bilateral retrograde pyelograms first on the left side.  The left ureteral orifice was cannulated with a 6-French end- hole catheter and left retrograde pyelogram was obtained.  Left retrograde pyelogram demonstrates a single left ureter, single system, left kidney.  No filling defects or narrowing noted.  Similarly, right retrograde pyelogram was obtained via the neo right ureteral orifice, which was easily visible.  Unfortunately, there was papillary tumor seen at this  location exiting the ureteral orifice.  Right retrograde pyelogram demonstrates a single right ureter, single system, right kidney.  No filling defects or narrowing noted and no hydronephrosis was noted.  The 0.038 Glidewire was then advanced at the level of the upper pole and a new 6 x 26 stent was placed.  Good proximal and distal curl were noted.  Final inspection of bladder revealed no evidence of perforation.  There was excellent hemostasis. There is complete resolution of all gross tumor except for some papillary areas seen emanating from the neo ureteral orifice on the right side.  Given the massive amount of resection, I felt that Foley catheter was indicated as such the resectoscope was exchanged for a new 22-French 3 Foley catheter with 10 mL sterile water in the balloon. This connected to straight drain.  Irrigation port was plugged.  Efflux was clear to very light pink.  Procedure was then terminated.  The patient tolerated the procedure well with no immediate periprocedural complications.  The patient was taken to postanesthesia care unit in stable condition.          ______________________________ Sebastian Ache, MD     TM/MEDQ  D:  10/26/2013  T:  10/27/2013  Job:  [id number]

## 2013-10-27 NOTE — Progress Notes (Signed)
*  PRELIMINARY RESULTS* Echocardiogram 2D Echocardiogram has been performed.  [name] 10/27/2013, 4:17 PM

## 2013-10-27 NOTE — Consult Note (Signed)
[facility] Medical Consultation  ADAR RASE ZOX:[id number] DOB: Dec 19, 1940 DOA: 10/26/2013   PCP: Romero Belling, MD    Requesting physician: Dr. Berneice Heinrich with urology Date of consultation: 10/27/2013 Reason for consultation: Shortness of breath and hypoxia  Chief Complaint: Shortness of breath  HPI: [name] is an 72 y.o. male with a past medical history of atrial fibrillation, obstructive sleep apnea, diabetes, on chronic anticoagulation, who underwent a transurethral resection of bladder tumor on November 19. He was kept overnight for pain control. Patient was seen by his attending provider this morning and it was noted that patient was hypoxic. So, they were unable to discharge him and hospitalist was called for consultation. Patient is currently complaining of cough and congestion in his chest that started last night. He denies any chest pain, but has been short of breath. Denies any nausea, vomiting, or abdominal pain. He has had some leg swelling, but not significantly. He mentioned that he was on Lasix for leg swelling till about 2-3 months ago, when it was discontinued by his cardiologist. He is having some symptoms suggestive of orthopnea. Denies any fever or chills. He's also complaining of pain in his penis where the Foley catheter is in place.   Home Medications: Prior to Admission medications   Medication Sig Start Date End Date Taking? Authorizing Provider  acetaminophen (TYLENOL) 500 MG tablet Take 500 mg by mouth every 6 (six) hours as needed for pain.   Yes Historical Provider, MD  ALPRAZolam Prudy Feeler) 0.25 MG tablet Take 1 tablet (0.25 mg total) by mouth 3 (three) times daily as needed for anxiety or sleep. 10/19/13  Yes Romero Belling, MD  Ascorbic Acid (VITAMIN C) 500 MG tablet Take 500 mg by mouth daily.     Yes Historical Provider, MD  brimonidine (ALPHAGAN P) 0.1 % SOLN Place 1 drop into the left eye 3 (three) times daily.    Yes Historical Provider, MD   cholecalciferol (VITAMIN D) 1000 UNITS tablet Take 1,000 Units by mouth daily.   Yes Historical Provider, MD  dorzolamide-timolol (COSOPT) 22.3-6.8 MG/ML ophthalmic solution Place 1 drop into both eyes 2 (two) times daily.  02/17/13  Yes Historical Provider, MD  fluticasone (FLONASE) 50 MCG/ACT nasal spray Place 2 sprays into the nose daily.  02/26/13  Yes Historical Provider, MD  guaiFENesin (MUCINEX) 600 MG 12 hr tablet Take 600 mg by mouth 2 (two) times daily as needed for to loosen phlegm.   Yes Historical Provider, MD  insulin regular human CONCENTRATED (HUMULIN R) 500 UNIT/ML SOLN injection 300 units with breakfast, 300 units with lunch 300 units with supper, 125 units with bedtime snack 10/24/13  Yes Romero Belling, MD  latanoprost (XALATAN) 0.005 % ophthalmic solution Place 1 drop into both eyes at bedtime.  03/12/13  Yes Historical Provider, MD  levothyroxine (SYNTHROID, LEVOTHROID) 50 MCG tablet Take 50 mcg by mouth daily before breakfast.   Yes Historical Provider, MD  Multiple Vitamin (MULTIVITAMIN WITH MINERALS) TABS tablet Take 1 tablet by mouth daily.   Yes Historical Provider, MD  omeprazole (PRILOSEC) 20 MG capsule Take 1 capsule (20 mg total) by mouth daily. 04/05/13  Yes Romero Belling, MD  Pancrelipase, Lip-Prot-Amyl, 25000 UNITS CPEP Take 25,000 Units by mouth 3 (three) times daily.  04/18/13  Yes Mardella Layman, MD  Probiotic Product (PROBIOTIC ACIDOPHILUS) CAPS Take 1 capsule by mouth daily.    Yes Historical Provider, MD  rosuvastatin (CRESTOR) 40 MG tablet Take 20 mg by mouth every evening.  Yes Historical Provider, MD  venlafaxine XR (EFFEXOR-XR) 150 MG 24 hr capsule Take 1 capsule (150 mg total) by mouth daily with breakfast. 10/19/13  Yes Romero Belling, MD  albuterol (PROVENTIL HFA;VENTOLIN HFA) 108 (90 BASE) MCG/ACT inhaler Inhale 2 puffs into the lungs every 6 (six) hours as needed for wheezing.    Historical Provider, MD  aspirin 325 MG tablet Take 325 mg by mouth daily.     Historical Provider, MD  OMEGA-3 1000 MG CAPS Take 1 capsule by mouth daily.     Historical Provider, MD  oxyCODONE-acetaminophen (ROXICET) 5-325 MG per tablet Take 1 tablet by mouth every 4 (four) hours as needed for moderate pain. Post-operatively 10/26/13   Sebastian Ache, MD  senna-docusate (SENOKOT-S) 8.6-50 MG per tablet Take 1 tablet by mouth 2 (two) times daily. While taking pain meds to prevent constipation 10/26/13   Sebastian Ache, MD  sulfamethoxazole-trimethoprim (BACTRIM DS) 800-160 MG per tablet Take 1 tablet by mouth 2 (two) times daily. X 3 days. Begin day prior to next Urology appointment for catheter removal. 10/26/13   Sebastian Ache, MD    Allergies:  Allergies  Allergen Reactions  . Pioglitazone     ACTOS  REACTION: Edema    Past Medical History: Past Medical History  Diagnosis Date  . Diabetes mellitus   . COPD (chronic obstructive pulmonary disease)     CPAP  . Renal insufficiency   . Hyperlipemia   . Other and unspecified coagulation defects   . Hypothyroidism   . Hyperkalemia   . 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 Sick sinus syndrome     s/p PPM by JA  . Pancreatitis   . Obesity   . Constipation   . OSA on CPAP     using CPAP although it is hard for him to tolerate  . SOB (shortness of breath)   . CAD (coronary artery disease)     not much  . Anginal pain     at times  . Hypertension     occasional  . Pacemaker   . Depression     recently  . Anxiety   . H/O Legionnaire's disease 2003  . History of blood clots     R groin  . Peripheral neuropathy   . Osteoarthritis     fingers  . GERD (gastroesophageal reflux disease)   . Cancer 2014    bladder cancer  . Anemia     hx of  . Pneumonia 2003    Past Surgical History  Procedure Laterality Date  . Nasal septum surgery  1967  . Pacemaker placement  06/2010    [facility] [employer] RF DR, Model [phone] ( Serial number [id number])  . Thromboembolectomy and four compartment  fasciotomy  2009  . Cardiac catheterization    . Cataract extraction Right   . Cholecystectomy  2012  . Insert / replace / remove pacemaker  2011    Social History:  Lives in [place] with his wife. He is a former smoker and used to drink alcohol in the past. But none currently. Usually independent with daily activities.  Family History:  Family History  Problem Relation Age of Onset  . Liver cancer Mother     deceased age 81  . Cancer Mother     liver cancer  . Colon cancer Neg Hx   . Heart attack Father      Review of Systems - History obtained  from the patient General ROS: positive for  - fatigue Psychological ROS: negative Ophthalmic ROS: negative ENT ROS: negative Allergy and Immunology ROS: negative Hematological and Lymphatic ROS: negative Endocrine ROS: negative Respiratory ROS: as in hpi Cardiovascular ROS: as in hpi Gastrointestinal ROS: no abdominal pain, change in bowel habits, or black or bloody stools Genito-Urinary ROS: no dysuria, trouble voiding, or hematuria Musculoskeletal ROS: negative Neurological ROS: no TIA or stroke symptoms Dermatological ROS: negative  Physical Examination: Filed Vitals:   10/27/13 0940 10/27/13 1000 10/27/13 1447 10/27/13 1452  BP:   132/62   Pulse:   60   Temp:   98.1 F (36.7 C)   TempSrc:   Oral   Resp:   19   Height:      Weight:      SpO2: 81% 95% 95% 95%    General appearance: alert, cooperative, appears stated age, no distress and morbidly obese Head: Normocephalic, without obvious abnormality, atraumatic Eyes: conjunctivae/corneas clear. PERRL, EOM's intact. Throat: lips, mucosa, and tongue normal; teeth and gums normal Neck: no adenopathy, no carotid bruit, no JVD, supple, symmetrical, trachea midline and thyroid not enlarged, symmetric, no tenderness/mass/nodules Resp: Coarse breath sounds with crackles bilaterally. No wheezing. No rhonchi Cardio: regular rate and rhythm, S1, S2 normal, no murmur, click,  rub or gallop GI: soft, non-tender; bowel sounds normal; no masses,  no organomegaly Extremities: extremities normal, atraumatic, no cyanosis or edema Pulses: 2+ and symmetric Skin: Skin color, texture, turgor normal. No rashes or lesions Neurologic: He is alert and oriented x3. No focal neurological deficits are present  Laboratory Data: Results for orders placed during the hospital encounter of 10/26/13 (from the past 48 hour(s))  GLUCOSE, CAPILLARY     Status: Abnormal   Collection Time    10/26/13  1:04 PM      Result Value Range   Glucose-Capillary 209 (*) 70 - 99 mg/dL  GLUCOSE, CAPILLARY     Status: Abnormal   Collection Time    10/26/13  9:25 PM      Result Value Range   Glucose-Capillary 226 (*) 70 - 99 mg/dL  GLUCOSE, CAPILLARY     Status: Abnormal   Collection Time    10/27/13 12:01 PM      Result Value Range   Glucose-Capillary 279 (*) 70 - 99 mg/dL  CBC     Status: Abnormal   Collection Time    10/27/13  2:05 PM      Result Value Range   WBC 13.7 (*) 4.0 - 10.5 K/uL   RBC 4.22  4.22 - 5.81 MIL/uL   Hemoglobin 12.8 (*) 13.0 - 17.0 g/dL   HCT 16.1  09.6 - 04.5 %   MCV 96.2  78.0 - 100.0 fL   MCH 30.3  26.0 - 34.0 pg   MCHC 31.5  30.0 - 36.0 g/dL   RDW 40.9  81.1 - 91.4 %   Platelets 227  150 - 400 K/uL  BASIC METABOLIC PANEL     Status: Abnormal   Collection Time    10/27/13  2:05 PM      Result Value Range   Sodium 133 (*) 135 - 145 mEq/L   Potassium 4.6  3.5 - 5.1 mEq/L   Chloride 99  96 - 112 mEq/L   CO2 24  19 - 32 mEq/L   Glucose, Bld 307 (*) 70 - 99 mg/dL   BUN 21  6 - 23 mg/dL   Creatinine, Ser 7.82  0.50 - 1.35  mg/dL   Calcium 8.7  8.4 - 16.1 mg/dL   GFR calc non Af Amer 56 (*) >90 mL/min   GFR calc Af Amer 64 (*) >90 mL/min   Comment: (NOTE)     The eGFR has been calculated using the CKD EPI equation.     This calculation has not been validated in all clinical situations.     eGFR's persistently <90 mL/min signify possible Chronic Kidney      Disease.  TROPONIN I     Status: None   Collection Time    10/27/13  2:05 PM      Result Value Range   Troponin I <0.30  <0.30 ng/mL   Comment:            Due to the release kinetics of cTnI,     a negative result within the first hours     of the onset of symptoms does not rule out     myocardial infarction with certainty.     If myocardial infarction is still suspected,     repeat the test at appropriate intervals.  PRO B NATRIURETIC PEPTIDE     Status: Abnormal   Collection Time    10/27/13  2:10 PM      Result Value Range   Pro B Natriuretic peptide (BNP) 1234.0 (*) 0 - 125 pg/mL    Imaging Studies: Dg Chest 2 View  10/27/2013   CLINICAL DATA:  Hypoxia with weakness and shortness of breath.  EXAM: CHEST  2 VIEW  COMPARISON:  2020/01/2213.  FINDINGS: Cardiac enlargement. Satisfactory position of Dual lead pacer. Interstitial and alveolar prominence consistent with congestive heart failure. Bilateral pneumonia less favored. Calcified tortuous aorta. Worsening aeration from priors. Small bilateral effusions.  IMPRESSION: Cardiac enlargement with interstitial and alveolar prominence consistent with congestive heart failure. Worsening aeration from priors.   Electronically Signed   By: Davonna Belling M.D.   On: 10/27/2013 12:06   Dg Abd 1 View  10/26/2013   CLINICAL DATA:  Bilateral retrograde pyelogram with right-sided ureteral stent placement  EXAM: DG C-ARM 1-60 MIN - NRPT [facility]; ABDOMEN - 1 VIEW  COMPARISON:  CT abdomen and pelvis -10/01/2013  FLUOROSCOPY TIME:  30 seconds  FINDINGS: Eight spot intraoperative fluoroscopic images during bilateral retrograde pyelogram and right-sided double-J ureteral stent placement are provided for review.  Images demonstrate sequential selective cannulation and opacification of the bilateral ureters. No discrete filling defects are seen along the opacified course of either ureter or collecting system. No evidence of hydronephrosis.  Subsequent images  demonstrate placement of a right-sided double-J ureteral stent with superior end uncoiled within the right renal pelvis and inferior end coiled overlying the expected location of the urinary bladder.  IMPRESSION: Bilateral retrograde pyelogram and right-sided ureteral stent placement as above. No discrete filling defects are seen along the opacified course of either ureter or collecting system. Further evaluation could be performed with dual phase renal CT as clinically indicated.   Electronically Signed   By: Simonne Come M.D.   On: 10/26/2013 17:18   Dg C-arm 1-60 Min-no Report  10/26/2013   CLINICAL DATA:  Bilateral retrograde pyelogram with right-sided ureteral stent placement  EXAM: DG C-ARM 1-60 MIN - NRPT [facility]; ABDOMEN - 1 VIEW  COMPARISON:  CT abdomen and pelvis -10/01/2013  FLUOROSCOPY TIME:  30 seconds  FINDINGS: Eight spot intraoperative fluoroscopic images during bilateral retrograde pyelogram and right-sided double-J ureteral stent placement are provided for review.  Images demonstrate sequential selective cannulation  and opacification of the bilateral ureters. No discrete filling defects are seen along the opacified course of either ureter or collecting system. No evidence of hydronephrosis.  Subsequent images demonstrate placement of a right-sided double-J ureteral stent with superior end uncoiled within the right renal pelvis and inferior end coiled overlying the expected location of the urinary bladder.  IMPRESSION: Bilateral retrograde pyelogram and right-sided ureteral stent placement as above. No discrete filling defects are seen along the opacified course of either ureter or collecting system. Further evaluation could be performed with dual phase renal CT as clinically indicated.   Electronically Signed   By: Simonne Come M.D.   On: 10/26/2013 17:18    EKG: EKG is pending at this time  Impression/Recommendations  Principal Problem:   Acute pulmonary edema Active Problems:    OBESITY-MORBID (>100')   OBSTRUCTIVE SLEEP APNEA   HYPERTENSION   DIASTOLIC HEART FAILURE, CHRONIC   Acute respiratory failure with hypoxia   DM type 2 (diabetes mellitus, type 2)   This is a 72 year old, Caucasian male, who underwent a transurethral resection of bladder tumor yesterday. He became hypoxic and short of breath overnight. Chest x-ray this morning shows findings concerning for pulmonary edema and congestive heart failure. He does have a history of diastolic heart failure, but had normal systolic function back in 2012.  #1 acute respiratory failure with hypoxia, secondary to acute pulmonary edema: He'll be given Intravenous Lasix. We'll check troponin and EKG. We'll order an echocardiogram. Possible reason for pulmonary edema is probably fluid shift during his surgery. Strict ins and outs, daily weights will be measured. Anticipate him improving after diuresis. And, if he does improve, he should be able to go home tomorrow and then follow up with his cardiologist as an outpatient. He may benefit from being discharged on diuretics. Chest x-ray can also be repeated tomorrow  #2 history of chronic diastolic heart failure: Please see above. Echocardiogram has been ordered.  #3 diabetes mellitus, type II: He be placed on Lantus insulin for now. Will hold off on his home regimen. Sliding scale coverage will be provided.  #4 status post transurethral resection of bladder tumor on November 19: Management per urology. He has a Foley catheter in place, which is quite painful. Urologist will reevaluate this afternoon.  #5 history of obstructive sleep apnea: CPAP will be ordered for him overnight.  #6 history of hypertension: Monitor blood pressures closely.  #7 history of atrial fibrillation and possible sick sinus syndrome status post pacemaker placement: He is on chronic anticoagulation as well. Warfarin was held for his current surgery. According to Dr. Berneice Heinrich, this can be resumed in about 2  days time. Continue to monitor his heart rate.   Code Status:  Full code DVT Prophylaxis: SCDs    Family Communication: Discussed with the patient and his wife  Disposition Plan: Not ready for discharge  I will followup again tomorrow. Please contact me if I can be of assistance in the meanwhile. Thank you for this consultation.  [facility]  [facility] Pager [phone]  If 7PM-7AM, please contact night-coverage.  [email or web address] Password [email or web address]  10/27/2013, 3:22 PM

## 2013-10-27 NOTE — Discharge Summary (Signed)
Physician Discharge Summary  Patient ID: [name] MRN: [id number] DOB/AGE: 07-01-1941 72 y.o.  Admit date: 10/26/2013 Discharge date: 10/27/2013  Admission Diagnoses:  Discharge Diagnoses:  Active Problems:   * No active hospital problems. *   Discharged Condition: good  Hospital Course:   1 - Bladder Cancer - s/p transurethral resection of bladder tumor, bilateral retrograde pyelograms, right ureteral stent placement 11/19 for large bladder cancer. Admitted overnight for pain control. By POD1, the day of discharge, he is tolerating diet, ambulatory, pain controlled, tolerating catheter w/o problems, and felt to be adequate for discharge.    Consults: None  Significant Diagnostic Studies: labs: pathology - pending  Treatments: surgery: transurethral resection of bladder tumor, bilateral retrograde pyelograms, right ureteral stent placement 11/19  Discharge Exam: Blood pressure 133/60, pulse 60, temperature 98.1 F (36.7 C), temperature source Oral, resp. rate 14, height 5\' 10"  (1.778 m), weight 144.198 kg (317 lb 14.4 oz), SpO2 95.00%. General appearance: alert, cooperative, appears stated age and morbidly obese Head: Normocephalic, without obvious abnormality, atraumatic Eyes: conjunctivae/corneas clear. PERRL, EOM's intact. Fundi benign. Ears: normal TM's and external ear canals both ears Nose: Nares normal. Septum midline. Mucosa normal. No drainage or sinus tenderness. Throat: lips, mucosa, and tongue normal; teeth and gums normal Neck: no adenopathy, no carotid bruit, no JVD, supple, symmetrical, trachea midline and thyroid not enlarged, symmetric, no tenderness/mass/nodules Back: symmetric, no curvature. ROM normal. No CVA tenderness. Resp: clear to auscultation bilaterally Chest wall: no tenderness Cardio: regular rate and rhythm, S1, S2 normal, no murmur, click, rub or gallop GI: soft, non-tender; bowel sounds normal; no masses,  no organomegaly Male genitalia:  normal, non-circumcised, foley c/d/i with dark yellow urine, no clots, foreskin reduced.  Extremities: extremities normal, atraumatic, no cyanosis or edema and venous stasis dermatitis noted Pulses: 2+ and symmetric Skin: Skin color, texture, turgor normal. No rashes or lesions Lymph nodes: Cervical, supraclavicular, and axillary nodes normal. Neurologic: Grossly normal  Disposition: 01-Home or Self Care   Future Appointments Provider Department Dept Phone   10/28/2013 11:00 AM [facility] [phone]   11/18/2013 11:15 AM Barbaraann Share, MD [facility] [phone]   11/18/2013 1:15 PM Romero Belling, MD [facility] [phone]   12/27/2013 1:00 PM Romero Belling, MD [facility] [phone]   01/04/2014 12:00 PM [facility] [phone]       Medication List    STOP taking these medications       traMADol 50 MG tablet  Commonly known as:  ULTRAM     warfarin 4 MG tablet  Commonly known as:  COUMADIN     warfarin 6 MG tablet  Commonly known as:  COUMADIN      TAKE these medications       acetaminophen 500 MG tablet  Commonly known as:  TYLENOL  Take 500 mg by mouth every 6 (six) hours as needed for pain.     albuterol 108 (90 BASE) MCG/ACT inhaler  Commonly known as:  PROVENTIL HFA;VENTOLIN HFA  Inhale 2 puffs into the lungs every 6 (six) hours as needed for wheezing.     ALPRAZolam 0.25 MG tablet  Commonly known as:  XANAX  Take 1 tablet (0.25 mg total) by mouth 3 (three) times daily as needed for anxiety or sleep.     aspirin 325 MG tablet  Take 325 mg by mouth daily.     brimonidine 0.1 %  Soln  Commonly known as:  ALPHAGAN P  Place 1 drop into the left eye 3 (three) times daily.     cholecalciferol 1000 UNITS tablet  Commonly known as:  VITAMIN D  Take 1,000 Units by mouth daily.     dorzolamide-timolol 22.3-6.8  MG/ML ophthalmic solution  Commonly known as:  COSOPT  Place 1 drop into both eyes 2 (two) times daily.     fluticasone 50 MCG/ACT nasal spray  Commonly known as:  FLONASE  Place 2 sprays into the nose daily.     guaiFENesin 600 MG 12 hr tablet  Commonly known as:  MUCINEX  Take 600 mg by mouth 2 (two) times daily as needed for to loosen phlegm.     insulin regular human CONCENTRATED 500 UNIT/ML Soln injection  Commonly known as:  HUMULIN R  300 units with breakfast, 300 units with lunch 300 units with supper, 125 units with bedtime snack     latanoprost 0.005 % ophthalmic solution  Commonly known as:  XALATAN  Place 1 drop into both eyes at bedtime.     levothyroxine 50 MCG tablet  Commonly known as:  SYNTHROID, LEVOTHROID  Take 50 mcg by mouth daily before breakfast.     multivitamin with minerals Tabs tablet  Take 1 tablet by mouth daily.     Omega-3 1000 MG Caps  Take 1 capsule by mouth daily.     omeprazole 20 MG capsule  Commonly known as:  PRILOSEC  Take 1 capsule (20 mg total) by mouth daily.     oxyCODONE-acetaminophen 5-325 MG per tablet  Commonly known as:  ROXICET  Take 1 tablet by mouth every 4 (four) hours as needed for moderate pain. Post-operatively     Pancrelipase (Lip-Prot-Amyl) 25000 UNITS Cpep  Take 25,000 Units by mouth 3 (three) times daily.     PROBIOTIC ACIDOPHILUS Caps  Take 1 capsule by mouth daily.     rosuvastatin 40 MG tablet  Commonly known as:  CRESTOR  Take 20 mg by mouth every evening.     senna-docusate 8.6-50 MG per tablet  Commonly known as:  Senokot-S  Take 1 tablet by mouth 2 (two) times daily. While taking pain meds to prevent constipation     sulfamethoxazole-trimethoprim 800-160 MG per tablet  Commonly known as:  BACTRIM DS  Take 1 tablet by mouth 2 (two) times daily. X 3 days. Begin day prior to next Urology appointment for catheter removal.     venlafaxine XR 150 MG 24 hr capsule  Commonly known as:  EFFEXOR-XR   Take 1 capsule (150 mg total) by mouth daily with breakfast.     vitamin C 500 MG tablet  Commonly known as:  ASCORBIC ACID  Take 500 mg by mouth daily.           Follow-up Information   Follow up with Sebastian Ache, MD On 11/08/2013. (We will also arrange for nurse visit early next week for catheter removal.)    Specialty:  Urology   Contact information:   [address] [place] [address] [phone]       Signed: Sebastian Ache 10/27/2013, 7:07 AM

## 2013-10-27 NOTE — Progress Notes (Signed)
Paged Urology because pt was C/O shortness of breath. Paged respiratory to give albuterol treatment. Pt very anxious and sitting on edge of bed. Oxygen saturation level is 92-93% on 2L. Urology told me to paged internal medicine. Respiratory reccommended BI-PAP PRN because of how wet (Rhochi with rales) the patient sounds. Waiting on internal medicine response.

## 2013-10-27 NOTE — Progress Notes (Signed)
UR completed 

## 2013-10-27 NOTE — Progress Notes (Signed)
Paged internal medicine. Recieved an order to give 40 MG Lasix now. Increased oxygen to 4L. O2 saturation came up to 95%. Will carry out all new orders and continue to monitor.

## 2013-10-27 NOTE — Progress Notes (Signed)
INITIAL NUTRITION ASSESSMENT  DOCUMENTATION CODES Per approved criteria  -Morbid Obesity   INTERVENTION: Provide Glucerna Shakes BID until appetite improves Encourage PO intake   NUTRITION DIAGNOSIS: Inadequate oral intake related to poor appetite as evidenced by pt not eating.   Goal: Pt to meet >/= 90% of their estimated nutrition needs   Monitor:  PO intake Weight Labs  Reason for Assessment: Malnutrition Screening Tool, score of 2  72 y.o. male  Admitting Dx: Acute pulmonary edema  ASSESSMENT: 72 y.o. male with a past medical history of atrial fibrillation, obstructive sleep apnea, diabetes, on chronic anticoagulation, who underwent a transurethral resection of bladder tumor on November 19. He was kept overnight for pain control. Patient was seen by his attending provider this morning and it was noted that patient was hypoxic.  Pt reports that he has had a poor appetite for the past month and has lots 6 lbs. He states he usually weighs 323 lbs. Pt has refused breakfast and lunch today; he has only eaten jello. Discussed that weight loss is beneficial but, nutrition is more important right now due to recent surgery. Encouraged adequate PO intake now to aid recovery and weight loss after he recovers from surgery.   Height: Ht Readings from Last 1 Encounters:  10/26/13 5\' 10"  (1.778 m)    Weight: Wt Readings from Last 1 Encounters:  10/26/13 317 lb 14.4 oz (144.198 kg)    Ideal Body Weight: 166 lbs  % Ideal Body Weight: 191%  Wt Readings from Last 10 Encounters:  10/26/13 317 lb 14.4 oz (144.198 kg)  10/26/13 317 lb 14.4 oz (144.198 kg)  10/24/13 312 lb (141.522 kg)  10/19/13 317 lb 14.4 oz (144.198 kg)  09/30/13 323 lb (146.512 kg)  09/26/13 324 lb 11.2 oz (147.283 kg)  08/09/13 320 lb (145.151 kg)  07/07/13 324 lb (146.965 kg)  06/27/13 323 lb (146.512 kg)  03/28/13 326 lb (147.873 kg)    Usual Body Weight: 323 lbs  % Usual Body Weight: 98%  BMI:   Body mass index is 45.61 kg/(m^2).  Estimated Nutritional Needs: Kcal: 2200-2400 Protein: 120-140 grams Fluid: 3-3.3 L/day  Skin: intact  Diet Order: Carb Control  EDUCATION NEEDS: -No education needs identified at this time   Intake/Output Summary (Last 24 hours) at 10/27/13 1704 Last data filed at 10/27/13 1534  Gross per 24 hour  Intake   1235 ml  Output   2950 ml  Net  -1715 ml    Last BM: 11/19   Labs:   Recent Labs Lab 10/24/13 1525 10/27/13 1405  NA 135 133*  K 4.0 4.6  CL 102 99  CO2 22 24  BUN 26* 21  CREATININE 1.26 1.27  CALCIUM 9.6 8.7  GLUCOSE 204* 307*    CBG (last 3)   Recent Labs  10/26/13 1304 10/26/13 2125 10/27/13 1201  GLUCAP 209* 226* 279*    Scheduled Meds: . albuterol  2 puff Inhalation QID  . atorvastatin  80 mg Oral q1800  . brimonidine  1 drop Left Eye TID  . Chlorhexidine Gluconate Cloth  6 each Topical Q0600  . docusate sodium  100 mg Oral BID  . dorzolamide-timolol  1 drop Both Eyes BID  . furosemide  60 mg Intravenous Q12H  . guaiFENesin  600 mg Oral BID  . insulin aspart  0-15 Units Subcutaneous TID WC  . insulin aspart  4 Units Subcutaneous TID WC  . insulin glargine  10 Units Subcutaneous QHS  . latanoprost  1 drop Both Eyes QHS  . levothyroxine  50 mcg Oral QAC breakfast  . lipase/protease/amylase  2 capsule Oral TID WC  . metoCLOPramide (REGLAN) injection  10 mg Intravenous Q8H  . mupirocin ointment  1 application Nasal BID  . mupirocin ointment   Nasal BID  . oxybutynin  10 mg Oral Once  . pantoprazole  40 mg Oral Daily  . senna  1 tablet Oral BID  . venlafaxine XR  150 mg Oral Q breakfast    Continuous Infusions:   Past Medical History  Diagnosis Date  . Diabetes mellitus   . COPD (chronic obstructive pulmonary disease)     CPAP  . Renal insufficiency   . Hyperlipemia   . Other and unspecified coagulation defects   . Hypothyroidism   . Hyperkalemia   . 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 Sick sinus syndrome     s/p PPM by JA  . Pancreatitis   . Obesity   . Constipation   . OSA on CPAP     using CPAP although it is hard for him to tolerate  . SOB (shortness of breath)   . CAD (coronary artery disease)     not much  . Anginal pain     at times  . Hypertension     occasional  . Pacemaker   . Depression     recently  . Anxiety   . H/O Legionnaire's disease 2003  . History of blood clots     R groin  . Peripheral neuropathy   . Osteoarthritis     fingers  . GERD (gastroesophageal reflux disease)   . Cancer 2014    bladder cancer  . Anemia     hx of  . Pneumonia 2003    Past Surgical History  Procedure Laterality Date  . Nasal septum surgery  1967  . Pacemaker placement  06/2010    [facility] [employer] RF DR, Model [id number] ( Serial number [id number])  . Thromboembolectomy and four compartment fasciotomy  2009  . Cardiac catheterization    . Cataract extraction Right   . Cholecystectomy  2012  . Insert / replace / remove pacemaker  2011  . Transurethral resection of bladder tumor with gyrus (turbt-gyrus) N/A 10/26/2013    Procedure: TRANSURETHRAL RESECTION OF BLADDER TUMOR WITH GYRUS (TURBT-GYRUS);  Surgeon: Sebastian Ache, MD;  Location: [facility];  Service: Urology;  Laterality: N/A;  . Cystoscopy w/ ureteral stent placement Right 10/26/2013    Procedure: CYSTOSCOPY WITH RETROGRADE PYELOGRAM/URETERAL STENT PLACEMENT;  Surgeon: Sebastian Ache, MD;  Location: [facility];  Service: Urology;  Laterality: Right;    Ian Malkin RD, LDN Inpatient Clinical Dietitian Pager: [phone] After Hours Pager: [phone]

## 2013-10-27 NOTE — Progress Notes (Signed)
Attempted to ambulate patient.  Made it 1/4 down hall of unit, patient stated he was too short of breath to continue.  Patient currently in bed NAD.  Will continue to monitor.

## 2013-10-27 NOTE — Progress Notes (Signed)
F/u on home 02 order, & requirements.  Patient not medically stable, t/c dme rep informed of current medical status.  tWill continue to monitor for d/c needs.

## 2013-10-27 NOTE — Care Management Note (Addendum)
    Page 1 of 1   10/28/2013     5:12:41 PM   CARE MANAGEMENT NOTE 10/28/2013  Patient:  [name], [name]   Account Number:  [id number]  Date Initiated:  10/27/2013  Documentation initiated by:  Lanier Clam  Subjective/Objective Assessment:   72 Y/O M ADMITTED W/BLADDER CA.ZO:XWRU.     Action/Plan:   FROM HOME.   Anticipated DC Date:  10/29/2013   Anticipated DC Plan:  HOME/SELF CARE      DC Planning Services  CM consult      Choice offered to / List presented to:             Status of service:  Completed, signed off Medicare Important Message given?   (If response is "NO", the following Medicare IM given date fields will be blank) Date Medicare IM given:   Date Additional Medicare IM given:    Discharge Disposition:  HOME/SELF CARE  Per UR Regulation:  Reviewed for med. necessity/level of care/duration of stay  If discussed at Long Length of Stay Meetings, dates discussed:    Comments:  10/28/13 Aamira Bischoff RN,BSN NCM [phone] PULMONARY EDEMA.PROVIDED PATIENT W/HHC AGENCY LIST AS RESOURCE.WILL NEED HHC OR HOME 02 ORDERS IF NEEDED.AWAIT FINAL ORDERS.  10/27/13 Annison Birchard RN,BSN NCM [phone] NOTED QUALIFIED FOR HOME 02 PER DOCUMENTATION.WILL NEED THE DX FOR THE HOME 02 NEED DOCUMENTED, & HOME 02 LITER FLOW/FREQ/DURATION.TC [facility] D[facility] REP FOLLOWING FOR HOME 02 ORDER,& DOCUMENTATION.

## 2013-10-27 NOTE — Progress Notes (Addendum)
Patient called out stated he was having trouble breathing.  O2 sats on room air at rest were at 84%.  Placed patient on 2L via nasal cannula, patient sats came up to 91%.  Rechecked patient saturation on room air at rest later, was at 81%.  Patiented needed up to 4L of O2 via [place] for sats to come up to 95% (at rest).  Have paged Dr. Berneice Heinrich and informed case manager.  Will continue to monitor patient.

## 2013-10-28 ENCOUNTER — Observation Stay (HOSPITAL_COMMUNITY): Payer: Medicare Other

## 2013-10-28 LAB — BASIC METABOLIC PANEL
BUN: 20 mg/dL (ref 6–23)
CO2: 23 mEq/L (ref 19–32)
Calcium: 9 mg/dL (ref 8.4–10.5)
Chloride: 94 mEq/L — ABNORMAL LOW (ref 96–112)
Creatinine, Ser: 1.35 mg/dL (ref 0.50–1.35)

## 2013-10-28 LAB — GLUCOSE, CAPILLARY
Glucose-Capillary: 480 mg/dL — ABNORMAL HIGH (ref 70–99)
Glucose-Capillary: 413 mg/dL — ABNORMAL HIGH (ref 70–99)

## 2013-10-28 LAB — CBC
HCT: 40.9 % (ref 39.0–52.0)
Hemoglobin: 13.4 g/dL (ref 13.0–17.0)
MCH: 31.2 pg (ref 26.0–34.0)
MCV: 95.1 fL (ref 78.0–100.0)
RBC: 4.3 MIL/uL (ref 4.22–5.81)
RDW: 14.9 % (ref 11.5–15.5)
WBC: 13 10*3/uL — ABNORMAL HIGH (ref 4.0–10.5)

## 2013-10-28 MED ORDER — FUROSEMIDE 10 MG/ML IJ SOLN
60.0000 mg | Freq: Three times a day (TID) | INTRAMUSCULAR | Status: DC
  Administered 2013-10-28 – 2013-10-29 (×4): 60 mg via INTRAVENOUS
  Filled 2013-10-28 (×4): qty 6

## 2013-10-28 MED ORDER — INSULIN ASPART 100 UNIT/ML ~~LOC~~ SOLN
20.0000 [IU] | Freq: Once | SUBCUTANEOUS | Status: AC
  Administered 2013-10-28: 20 [IU] via SUBCUTANEOUS

## 2013-10-28 MED ORDER — INSULIN GLARGINE 100 UNIT/ML ~~LOC~~ SOLN
40.0000 [IU] | Freq: Two times a day (BID) | SUBCUTANEOUS | Status: DC
  Administered 2013-10-28: 40 [IU] via SUBCUTANEOUS
  Filled 2013-10-28 (×3): qty 0.4

## 2013-10-28 MED ORDER — INSULIN GLARGINE 100 UNIT/ML ~~LOC~~ SOLN
50.0000 [IU] | Freq: Two times a day (BID) | SUBCUTANEOUS | Status: DC
  Administered 2013-10-28: 50 [IU] via SUBCUTANEOUS
  Filled 2013-10-28 (×2): qty 0.5

## 2013-10-28 MED ORDER — INSULIN ASPART 100 UNIT/ML ~~LOC~~ SOLN
8.0000 [IU] | Freq: Three times a day (TID) | SUBCUTANEOUS | Status: DC
  Administered 2013-10-28 – 2013-10-30 (×5): 8 [IU] via SUBCUTANEOUS

## 2013-10-28 NOTE — Progress Notes (Signed)
2 Days Post-Op  Subjective:  1 - Bladder Cancer - s/p transurethral resection of bladder tumor, bilateral retrograde pyelograms, right ureteral stent placement 11/19 for large bladder cancer. Path remarkably shows TaG1 disease (though huge volume, and likely some persistent at Rt UO level)  2 -Acute Hypoxia - Pt unable to wean O2 on POD1, hospitalist consult obtained and feel likely acute volume overloand and recieveing diuresis.   Today [name] is feeling better in terms of SOB with diuresis. Foley working well.   Objective: Vital signs in last 24 hours: Temp:  [97.7 F (36.5 C)-99 F (37.2 C)] 98.2 F (36.8 C) (11/21 1618) Pulse Rate:  [59-78] 70 (11/21 1618) Resp:  [18-21] 20 (11/21 1618) BP: (132-166)/(56-60) 152/59 mmHg (11/21 1618) SpO2:  [91 %-96 %] 95 % (11/21 1618) Last BM Date: 10/27/13  Intake/Output from previous day: 11/20 0701 - 11/21 0700 In: 0  Out: 4050 [Urine:4050] Intake/Output this shift: Total I/O In: 480 [P.O.:480] Out: 2800 [Urine:2800]  General appearance: alert, cooperative, appears stated age and morbidly obese Head: Normocephalic, without obvious abnormality, atraumatic Eyes: conjunctivae/corneas clear. PERRL, EOM's intact. Fundi benign. Ears: normal TM's and external ear canals both ears Nose: Nares normal. Septum midline. Mucosa normal. No drainage or sinus tenderness. Throat: lips, mucosa, and tongue normal; teeth and gums normal Neck: no adenopathy, no carotid bruit, no JVD, supple, symmetrical, trachea midline and thyroid not enlarged, symmetric, no tenderness/mass/nodules Back: symmetric, no curvature. ROM normal. No CVA tenderness. Resp: clear to auscultation bilaterally Chest wall: no tenderness Cardio: regular rate and rhythm, S1, S2 normal, no murmur, click, rub or gallop GI: soft, non-tender; bowel sounds normal; no masses,  no organomegaly Male genitalia: normal, foley c/d/i with clear yellow urine.  Extremities: extremities normal,  atraumatic, no cyanosis or edema and venous stasis dermatitis noted Pulses: 2+ and symmetric Skin: Skin color, texture, turgor normal. No rashes or lesions Lymph nodes: Cervical, supraclavicular, and axillary nodes normal. Neurologic: Grossly normal  Lab Results:   Recent Labs  10/27/13 1405 10/28/13 0501  WBC 13.7* 13.0*  HGB 12.8* 13.4  HCT 40.6 40.9  PLT 227 220   BMET  Recent Labs  10/27/13 1405 10/28/13 0501  NA 133* 130*  K 4.6 4.2  CL 99 94*  CO2 24 23  GLUCOSE 307* 412*  BUN 21 20  CREATININE 1.27 1.35  CALCIUM 8.7 9.0   PT/INR No results found for this basename: LABPROT, INR,  in the last 72 hours ABG No results found for this basename: PHART, PCO2, PO2, HCO3,  in the last 72 hours  Studies/Results: Dg Chest 2 View  10/27/2013   CLINICAL DATA:  Hypoxia with weakness and shortness of breath.  EXAM: CHEST  2 VIEW  COMPARISON:  September 10, 202014.  FINDINGS: Cardiac enlargement. Satisfactory position of Dual lead pacer. Interstitial and alveolar prominence consistent with congestive heart failure. Bilateral pneumonia less favored. Calcified tortuous aorta. Worsening aeration from priors. Small bilateral effusions.  IMPRESSION: Cardiac enlargement with interstitial and alveolar prominence consistent with congestive heart failure. Worsening aeration from priors.   Electronically Signed   By: Davonna Belling M.D.   On: 10/27/2013 12:06   Dg Chest Port 1 View  10/28/2013   CLINICAL DATA:  CHF.  EXAM: PORTABLE CHEST - 1 VIEW  COMPARISON:  10/27/2013.  FINDINGS: Mediastinum and hilar structures are normal. Persistent cardiomegaly and pulmonary vascular prominence with bilateral pulmonary interstitial and alveolar prominence consistent with congestive heart failure with pulmonary edema. Pulmonary edema has cleared slightly from prior exam.  Small left pleural effusion again noted. No pneumothorax. Cardiac pacer in stable position.  IMPRESSION: 1. Congestive heart failure with pulmonary  edema and small left pleural effusion. Pulmonary edema has cleared slightly from prior exam. 2. Cardiac pacer in stable position.   Electronically Signed   By: Maisie Fus  Register   On: 10/28/2013 07:20    Anti-infectives: Anti-infectives   Start     Dose/Rate Route Frequency Ordered Stop   10/26/13 0000  sulfamethoxazole-trimethoprim (BACTRIM DS) 800-160 MG per tablet     1 tablet Oral 2 times daily 10/26/13 1645     10/25/13 1616  gentamicin (GARAMYCIN) 500 mg in dextrose 5 % 100 mL IVPB     500 mg 112.5 mL/hr over 60 Minutes Intravenous 30 min pre-op 10/25/13 1616 10/26/13 1527      Assessment/Plan:  1 - Bladder Cancer - favorable path discussed with pt. Will likely plan for repeat TURBT with rt ureteroscopy and possible tumor ablation and stent change in about 3 mos to maximally debulk his favorable tumor and verify no recurrence.   2 -Acute Hypoxia - Improving. Greatly appreciate hospitalist help.    [facility], [name] 10/28/2013

## 2013-10-28 NOTE — Progress Notes (Signed)
[facility] PROGRESS NOTE  [name] ZOX:[id number] DOB: Jul 16, 1941 DOA: 10/26/2013  PCP: Romero Belling, MD  Brief HPI: [name] is an 72 y.o. male with a past medical history of atrial fibrillation, obstructive sleep apnea, diabetes, on chronic anticoagulation, who underwent a transurethral resection of bladder tumor on November 19. He was kept overnight for pain control. Patient was seen by his attending provider on 11/20 and it was noted that patient was hypoxic. So, they were unable to discharge him and hospitalist was called for consultation. Patient was complaining of cough and congestion in his chest that started the previous night. He denied any chest pain, but has been short of breath. Denied any nausea, vomiting, or abdominal pain. He has had some leg swelling, but not significantly. He mentioned that he was on Lasix for leg swelling till about 2-3 months ago, when it was discontinued by his cardiologist. He was having symptoms suggestive of orthopnea.   Past medical history:  Past Medical History  Diagnosis Date  . Diabetes mellitus   . COPD (chronic obstructive pulmonary disease)     CPAP  . Renal insufficiency   . Hyperlipemia   . Other and unspecified coagulation defects   . Hypothyroidism   . Hyperkalemia   . 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 Sick sinus syndrome     s/p PPM by JA  . Pancreatitis   . Obesity   . Constipation   . OSA on CPAP     using CPAP although it is hard for him to tolerate  . SOB (shortness of breath)   . CAD (coronary artery disease)     not much  . Anginal pain     at times  . Hypertension     occasional  . Pacemaker   . Depression     recently  . Anxiety   . H/O Legionnaire's disease 2003  . History of blood clots     R groin  . Peripheral neuropathy   . Osteoarthritis     fingers  . GERD (gastroesophageal reflux disease)   . Cancer 2014    bladder cancer  . Anemia     hx of  . Pneumonia 2003     Consultants: Urology  Procedures:  2D ECHO 11/20 Study Conclusions - Left ventricle: The cavity size was normal. Wall thickness was increased in a pattern of moderate LVH. Systolic function was normal. The estimated ejection fraction was in the range of 55% to 60%. Wall motion was normal; there were no regional wall motion abnormalities. - Aortic valve: There was mild stenosis. Valve area: 1.85cm^2(VTI). Valve area: 1.68cm^2 (Vmax). - Left atrium: The atrium was mildly dilated. - Right atrium: The atrium was mildly dilated.  Antibiotics: None  Subjective: Patient became very short of breath overnight. Received Lasix with improvement. Still orthopneic but better. No chest pain. No further pain around foley.  Objective: Vital Signs  Filed Vitals:   10/27/13 2129 10/27/13 2159 10/28/13 0609 10/28/13 0830  BP: 132/56  166/60   Pulse: 59 78 63   Temp: 97.7 F (36.5 C)  99 F (37.2 C)   TempSrc: Oral  Oral   Resp: 18 21 20    Height:      Weight:      SpO2: 91% 95% 95% 96%    Filed Weights   10/26/13 1841  Weight: 144.198 kg (317 lb 14.4 oz)    Intake/Output from previous  day: 11/20 0701 - 11/21 0700 In: 0  Out: 4050 [Urine:4050]  General appearance: alert, cooperative, appears stated age, no distress and morbidly obese Head: Normocephalic, without obvious abnormality, atraumatic Resp: Decreased air entry at bases. No wheezing. Crackles noted. Cardio: regular rate and rhythm, S1, S2 normal, no murmur, click, rub or gallop GI: soft, non-tender; bowel sounds normal; no masses,  no organomegaly Extremities: edema minimal edema Skin: Skin color, texture, turgor normal. No rashes or lesions Neurologic: No focal deficits  Lab Results:  Basic Metabolic Panel:  Recent Labs Lab 10/24/13 1525 10/27/13 1405 10/28/13 0501  NA 135 133* 130*  K 4.0 4.6 4.2  CL 102 99 94*  CO2 22 24 23   GLUCOSE 204* 307* 412*  BUN 26* 21 20  CREATININE 1.26 1.27 1.35  CALCIUM 9.6  8.7 9.0   CBC:  Recent Labs Lab 10/24/13 1525 10/27/13 1405 10/28/13 0501  WBC 13.4* 13.7* 13.0*  HGB 13.9 12.8* 13.4  HCT 41.6 40.6 40.9  MCV 94.1 96.2 95.1  PLT 274 227 220   Cardiac Enzymes:  Recent Labs Lab 10/27/13 1405  TROPONINI <0.30   BNP (last 3 results)  Recent Labs  06/27/13 1159 10/27/13 1410  PROBNP 76.0 1234.0*   CBG:  Recent Labs Lab 10/27/13 0740 10/27/13 1201 10/27/13 1656 10/27/13 2127 10/28/13 0801  GLUCAP 304* 279* 315* 331* 395*    Recent Results (from the past 240 hour(s))  SURGICAL PCR SCREEN     Status: Abnormal   Collection Time    10/24/13  3:23 PM      Result Value Range Status   MRSA, PCR POSITIVE (*) NEGATIVE Final   Staphylococcus aureus POSITIVE (*) NEGATIVE Final   Comment:            The Xpert SA Assay (FDA     approved for NASAL specimens     in patients over 28 years of age),     is one component of     a comprehensive surveillance     program.  Test performance has     been validated by [employer] for patients greater     than or equal to 16 year old.     It is not intended     to diagnose infection nor to     guide or monitor treatment.      Studies/Results: Dg Chest 2 View  10/27/2013   CLINICAL DATA:  Hypoxia with weakness and shortness of breath.  EXAM: CHEST  2 VIEW  COMPARISON:  10/28/202014.  FINDINGS: Cardiac enlargement. Satisfactory position of Dual lead pacer. Interstitial and alveolar prominence consistent with congestive heart failure. Bilateral pneumonia less favored. Calcified tortuous aorta. Worsening aeration from priors. Small bilateral effusions.  IMPRESSION: Cardiac enlargement with interstitial and alveolar prominence consistent with congestive heart failure. Worsening aeration from priors.   Electronically Signed   By: Davonna Belling M.D.   On: 10/27/2013 12:06   Dg Abd 1 View  10/26/2013   CLINICAL DATA:  Bilateral retrograde pyelogram with right-sided ureteral stent placement  EXAM:  DG C-ARM 1-60 MIN - NRPT [facility]; ABDOMEN - 1 VIEW  COMPARISON:  CT abdomen and pelvis -10/01/2013  FLUOROSCOPY TIME:  30 seconds  FINDINGS: Eight spot intraoperative fluoroscopic images during bilateral retrograde pyelogram and right-sided double-J ureteral stent placement are provided for review.  Images demonstrate sequential selective cannulation and opacification of the bilateral ureters. No discrete filling defects are seen along the opacified course of either  ureter or collecting system. No evidence of hydronephrosis.  Subsequent images demonstrate placement of a right-sided double-J ureteral stent with superior end uncoiled within the right renal pelvis and inferior end coiled overlying the expected location of the urinary bladder.  IMPRESSION: Bilateral retrograde pyelogram and right-sided ureteral stent placement as above. No discrete filling defects are seen along the opacified course of either ureter or collecting system. Further evaluation could be performed with dual phase renal CT as clinically indicated.   Electronically Signed   By: Simonne Come M.D.   On: 10/26/2013 17:18   Dg Chest Port 1 View  10/28/2013   CLINICAL DATA:  CHF.  EXAM: PORTABLE CHEST - 1 VIEW  COMPARISON:  10/27/2013.  FINDINGS: Mediastinum and hilar structures are normal. Persistent cardiomegaly and pulmonary vascular prominence with bilateral pulmonary interstitial and alveolar prominence consistent with congestive heart failure with pulmonary edema. Pulmonary edema has cleared slightly from prior exam. Small left pleural effusion again noted. No pneumothorax. Cardiac pacer in stable position.  IMPRESSION: 1. Congestive heart failure with pulmonary edema and small left pleural effusion. Pulmonary edema has cleared slightly from prior exam. 2. Cardiac pacer in stable position.   Electronically Signed   By: Maisie Fus  Register   On: 10/28/2013 07:20   Dg C-arm 1-60 Min-no Report  10/26/2013   CLINICAL DATA:  Bilateral retrograde  pyelogram with right-sided ureteral stent placement  EXAM: DG C-ARM 1-60 MIN - NRPT [facility]; ABDOMEN - 1 VIEW  COMPARISON:  CT abdomen and pelvis -10/01/2013  FLUOROSCOPY TIME:  30 seconds  FINDINGS: Eight spot intraoperative fluoroscopic images during bilateral retrograde pyelogram and right-sided double-J ureteral stent placement are provided for review.  Images demonstrate sequential selective cannulation and opacification of the bilateral ureters. No discrete filling defects are seen along the opacified course of either ureter or collecting system. No evidence of hydronephrosis.  Subsequent images demonstrate placement of a right-sided double-J ureteral stent with superior end uncoiled within the right renal pelvis and inferior end coiled overlying the expected location of the urinary bladder.  IMPRESSION: Bilateral retrograde pyelogram and right-sided ureteral stent placement as above. No discrete filling defects are seen along the opacified course of either ureter or collecting system. Further evaluation could be performed with dual phase renal CT as clinically indicated.   Electronically Signed   By: Simonne Come M.D.   On: 10/26/2013 17:18    Medications:  Scheduled: . albuterol  2 puff Inhalation QID  . atorvastatin  80 mg Oral q1800  . brimonidine  1 drop Left Eye TID  . Chlorhexidine Gluconate Cloth  6 each Topical Q0600  . docusate sodium  100 mg Oral BID  . dorzolamide-timolol  1 drop Both Eyes BID  . feeding supplement (GLUCERNA SHAKE)  237 mL Oral BID PC  . furosemide  60 mg Intravenous Q8H  . guaiFENesin  600 mg Oral BID  . insulin aspart  0-15 Units Subcutaneous TID WC  . insulin aspart  4 Units Subcutaneous TID WC  . insulin glargine  40 Units Subcutaneous BID  . latanoprost  1 drop Both Eyes QHS  . levothyroxine  50 mcg Oral QAC breakfast  . lipase/protease/amylase  2 capsule Oral TID WC  . metoCLOPramide (REGLAN) injection  10 mg Intravenous Q8H  . mupirocin ointment  1  application Nasal BID  . mupirocin ointment   Nasal BID  . pantoprazole  40 mg Oral Daily  . senna  1 tablet Oral BID  . venlafaxine XR  150  mg Oral Q breakfast   Continuous:  WUJ:[id number], guaiFENesin, HYDROmorphone (DILAUDID) injection, hyoscyamine, ondansetron, oxyCODONE  Assessment/Plan:  Principal Problem:   Acute pulmonary edema Active Problems:   OBESITY-MORBID (>100')   OBSTRUCTIVE SLEEP APNEA   HYPERTENSION   DIASTOLIC HEART FAILURE, CHRONIC   Acute respiratory failure with hypoxia   DM type 2 (diabetes mellitus, type 2)    Acute respiratory failure with hypoxia, secondary to Acute pulmonary edema This is likely due to fluid overload from IVF and operative fluid shifts. ECHO shows normal systolic LVEF. Could also be acute diastolic heart failure. Increase frequency of Lasix for today. He appears to be diuresing well. XR shows slight improvement. EKG shows paced rhythm with PVC's. Will place telemetry. Strict ins and outs, daily weights.   History of chronic diastolic heart failure Please see above. Echocardiogram results noted.  Diabetes mellitus, type II Poorly controlled. Will increase Lantus. SSI. On a different insulin regimen at home which cannot be utilized here.  Status post transurethral resection of bladder tumor on November 19 Management per urology. He has a Foley catheter in place.  History of obstructive sleep apnea Continue CPAP  History of hypertension Monitor blood pressures closely.   History of atrial fibrillation and possible sick sinus syndrome status post pacemaker placement Stable. Monitor on Tele. He is on chronic anticoagulation as well. Warfarin was held for his current surgery. According to Dr. Berneice Heinrich, this can be resumed on 11/23.   Glaucoma Continue eye drops.  Code Status: Full code  DVT Prophylaxis: SCDs  Family Communication: Discussed with the patient   Disposition Plan: He is better but not ready for discharge. [facility] will  assume care.     LOS: 2 days   [facility]  [facility] Pager [phone] 10/28/2013, 8:36 AM  If 8PM-8AM, please contact night-coverage at [email or web address], password [facility]

## 2013-10-28 NOTE — Progress Notes (Signed)
After giving 40 mg Lasix patient is feeling much better. Decrease Oxygen back down to 2L and patient's oxygen saturation is 95-95%. Breath Sounds are dimniished with a small amount of crackles at bases. Respiratory placed C-Pap and patient is resting comfortably in the chair. Will continue to monitor.

## 2013-10-29 LAB — BASIC METABOLIC PANEL
Calcium: 9.3 mg/dL (ref 8.4–10.5)
GFR calc Af Amer: 59 mL/min — ABNORMAL LOW (ref >90)
GFR calc non Af Amer: 51 mL/min — ABNORMAL LOW (ref >90)
Sodium: 131 mEq/L — ABNORMAL LOW (ref 135–145)

## 2013-10-29 LAB — GLUCOSE, CAPILLARY
Glucose-Capillary: 382 mg/dL — ABNORMAL HIGH (ref 70–99)
Glucose-Capillary: 227 mg/dL — ABNORMAL HIGH (ref 70–99)
Glucose-Capillary: 263 mg/dL — ABNORMAL HIGH (ref 70–99)

## 2013-10-29 LAB — PRO B NATRIURETIC PEPTIDE: Pro B Natriuretic peptide (BNP): 1362 pg/mL — ABNORMAL HIGH (ref 0–125)

## 2013-10-29 MED ORDER — INSULIN GLARGINE 100 UNIT/ML ~~LOC~~ SOLN
65.0000 [IU] | Freq: Two times a day (BID) | SUBCUTANEOUS | Status: DC
Start: 2013-10-29 — End: 2013-10-30
  Administered 2013-10-29 (×2): 65 [IU] via SUBCUTANEOUS
  Filled 2013-10-29 (×4): qty 0.65

## 2013-10-29 MED ORDER — POTASSIUM CHLORIDE CRYS ER 20 MEQ PO TBCR
40.0000 meq | EXTENDED_RELEASE_TABLET | Freq: Once | ORAL | Status: AC
  Administered 2013-10-29: 40 meq via ORAL
  Filled 2013-10-29: qty 2

## 2013-10-29 MED ORDER — FUROSEMIDE 10 MG/ML IJ SOLN
40.0000 mg | Freq: Two times a day (BID) | INTRAMUSCULAR | Status: DC
  Administered 2013-10-29 – 2013-10-30 (×2): 40 mg via INTRAVENOUS
  Filled 2013-10-29 (×2): qty 4

## 2013-10-29 MED ORDER — INSULIN ASPART 100 UNIT/ML ~~LOC~~ SOLN
20.0000 [IU] | Freq: Once | SUBCUTANEOUS | Status: AC
  Administered 2013-10-29: 20 [IU] via SUBCUTANEOUS

## 2013-10-29 MED ORDER — FUROSEMIDE 10 MG/ML IJ SOLN
INTRAMUSCULAR | Status: AC
  Filled 2013-10-29: qty 8

## 2013-10-29 NOTE — Progress Notes (Signed)
3 Days Post-Op  Subjective:  1 - Bladder Cancer - s/p transurethral resection of bladder tumor, bilateral retrograde pyelograms, right ureteral stent placement 11/19 for large bladder cancer. Path remarkably shows TaG1 disease (though huge volume, and likely some persistent at Rt UO level)  2 -Acute Hypoxia - Pt unable to wean O2 on POD1, hospitalist consult obtained and feel likely acute volume overloand and recieveing diuresis.   Today [name] is continues to feel stronger. Foley working well.  Objective: Vital signs in last 24 hours: Temp:  [98.1 F (36.7 C)-98.2 F (36.8 C)] 98.1 F (36.7 C) (11/22 0645) Pulse Rate:  [64-70] 64 (11/22 0645) Resp:  [16-20] 16 (11/22 0645) BP: (140-152)/(50-59) 150/58 mmHg (11/22 0645) SpO2:  [92 %-96 %] 92 % (11/22 0831) Weight:  [134.7 kg (296 lb 15.4 oz)-136.4 kg (300 lb 11.3 oz)] 134.7 kg (296 lb 15.4 oz) (11/22 0645) Last BM Date: 10/27/13  Intake/Output from previous day: 11/21 0701 - 11/22 0700 In: 720 [P.O.:720] Out: 4950 [Urine:4950] Intake/Output this shift:    General appearance: alert, cooperative, appears stated age and less tachynpic adn off O2 Head: Normocephalic, without obvious abnormality, atraumatic Eyes: conjunctivae/corneas clear. PERRL, EOM's intact. Fundi benign. Ears: normal TM's and external ear canals both ears Nose: Nares normal. Septum midline. Mucosa normal. No drainage or sinus tenderness. Throat: lips, mucosa, and tongue normal; teeth and gums normal Neck: no adenopathy, no carotid bruit, no JVD, supple, symmetrical, trachea midline and thyroid not enlarged, symmetric, no tenderness/mass/nodules Back: symmetric, no curvature. ROM normal. No CVA tenderness. Resp: clear to auscultation bilaterally Chest wall: no tenderness Cardio: regular rate and rhythm, S1, S2 normal, no murmur, click, rub or gallop GI: soft, non-tender; bowel sounds normal; no masses,  no organomegaly Male genitalia: normal, foley with grossly  clear urine, no clots Extremities: extremities normal, atraumatic, no cyanosis or edema Pulses: 2+ and symmetric Skin: Skin color, texture, turgor normal. No rashes or lesions Lymph nodes: Cervical, supraclavicular, and axillary nodes normal. Neurologic: Grossly normal  Lab Results:   Recent Labs  10/27/13 1405 10/28/13 0501  WBC 13.7* 13.0*  HGB 12.8* 13.4  HCT 40.6 40.9  PLT 227 220   BMET  Recent Labs  10/28/13 0501 10/29/13 0447  NA 130* 131*  K 4.2 3.6  CL 94* 90*  CO2 23 28  GLUCOSE 412* 360*  BUN 20 26*  CREATININE 1.35 1.37*  CALCIUM 9.0 9.3   PT/INR No results found for this basename: LABPROT, INR,  in the last 72 hours ABG No results found for this basename: PHART, PCO2, PO2, HCO3,  in the last 72 hours  Studies/Results: Dg Chest 2 View  10/27/2013   CLINICAL DATA:  Hypoxia with weakness and shortness of breath.  EXAM: CHEST  2 VIEW  COMPARISON:  2020/07/2713.  FINDINGS: Cardiac enlargement. Satisfactory position of Dual lead pacer. Interstitial and alveolar prominence consistent with congestive heart failure. Bilateral pneumonia less favored. Calcified tortuous aorta. Worsening aeration from priors. Small bilateral effusions.  IMPRESSION: Cardiac enlargement with interstitial and alveolar prominence consistent with congestive heart failure. Worsening aeration from priors.   Electronically Signed   By: Davonna Belling M.D.   On: 10/27/2013 12:06   Dg Chest Port 1 View  10/28/2013   CLINICAL DATA:  CHF.  EXAM: PORTABLE CHEST - 1 VIEW  COMPARISON:  10/27/2013.  FINDINGS: Mediastinum and hilar structures are normal. Persistent cardiomegaly and pulmonary vascular prominence with bilateral pulmonary interstitial and alveolar prominence consistent with congestive heart failure with pulmonary edema.  Pulmonary edema has cleared slightly from prior exam. Small left pleural effusion again noted. No pneumothorax. Cardiac pacer in stable position.  IMPRESSION: 1. Congestive  heart failure with pulmonary edema and small left pleural effusion. Pulmonary edema has cleared slightly from prior exam. 2. Cardiac pacer in stable position.   Electronically Signed   By: Maisie Fus  Register   On: 10/28/2013 07:20    Anti-infectives: Anti-infectives   Start     Dose/Rate Route Frequency Ordered Stop   10/26/13 0000  sulfamethoxazole-trimethoprim (BACTRIM DS) 800-160 MG per tablet     1 tablet Oral 2 times daily 10/26/13 1645     10/25/13 1616  gentamicin (GARAMYCIN) 500 mg in dextrose 5 % 100 mL IVPB     500 mg 112.5 mL/hr over 60 Minutes Intravenous 30 min pre-op 10/25/13 1616 10/26/13 1527      Assessment/Plan:  1 - Bladder Cancer - favorable path discussed with pt. Will likely plan for repeat TURBT with rt ureteroscopy and possible tumor ablation and stent change in about 3 mos to maximally debulk his favorable tumor and verify no recurrence.   Agree sage to restart coumadin tomorrow 11/23.   2 -Acute Hypoxia - Improving. Greatly appreciate hospitalist help.   [facility], Yesennia Hirota 10/29/2013

## 2013-10-29 NOTE — Progress Notes (Signed)
Pt ambulated Approx 52 ft. Without o2 sat 97%.

## 2013-10-29 NOTE — Progress Notes (Signed)
[facility] PROGRESS NOTE  [name] ZOX:[id number] DOB: [birth year] DOA: 10/26/2013  PCP: Romero Belling, MD  Brief HPI: [name] is an 72 y.o. male with a past medical history of atrial fibrillation, obstructive sleep apnea, diabetes, on chronic anticoagulation, who underwent a transurethral resection of bladder tumor on November 19. He was kept overnight for pain control. Patient was seen by his attending provider on 11/20 and it was noted that patient was hypoxic. So, they were unable to discharge him and hospitalist was called for consultation. Patient was complaining of cough and congestion in his chest that started the previous night. He denied any chest pain, but has been short of breath. Denied any nausea, vomiting, or abdominal pain. He has had some leg swelling, but not significantly. He mentioned that he was on Lasix for leg swelling till about 2-3 months ago, when it was discontinued by his cardiologist. He was having symptoms suggestive of orthopnea.   Past medical history:  Past Medical History  Diagnosis Date  . Diabetes mellitus   . COPD (chronic obstructive pulmonary disease)     CPAP  . Renal insufficiency   . Hyperlipemia   . Other and unspecified coagulation defects   . Hypothyroidism   . Hyperkalemia   . 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 Sick sinus syndrome     s/p PPM by JA  . Pancreatitis   . Obesity   . Constipation   . OSA on CPAP     using CPAP although it is hard for him to tolerate  . SOB (shortness of breath)   . CAD (coronary artery disease)     not much  . Anginal pain     at times  . Hypertension     occasional  . Pacemaker   . Depression     recently  . Anxiety   . H/O Legionnaire's disease 2003  . History of blood clots     R groin  . Peripheral neuropathy   . Osteoarthritis     fingers  . GERD (gastroesophageal reflux disease)   . Cancer 2014    bladder cancer  . Anemia     hx of  . Pneumonia 2003     Consultants: Urology  Procedures:  2D ECHO 11/20 Study Conclusions - Left ventricle: The cavity size was normal. Wall thickness was increased in a pattern of moderate LVH. Systolic function was normal. The estimated ejection fraction was in the range of 55% to 60%. Wall motion was normal; there were no regional wall motion abnormalities. - Aortic valve: There was mild stenosis. Valve area: 1.85cm^2(VTI). Valve area: 1.68cm^2 (Vmax). - Left atrium: The atrium was mildly dilated. - Right atrium: The atrium was mildly dilated.  Antibiotics: None  Subjective: Patient is feeling better. Breathing has improved. Denies chest pain. Interested in going home.   Objective: Vital Signs  Filed Vitals:   10/28/13 2237 10/29/13 0500 10/29/13 0645 10/29/13 0831  BP: 140/50  150/58   Pulse: 69  64   Temp: 98.1 F (36.7 C)  98.1 F (36.7 C)   TempSrc: Oral  Oral   Resp: 20  16   Height:      Weight: 136.4 kg (300 lb 11.3 oz) 136.4 kg (300 lb 11.3 oz) 134.7 kg (296 lb 15.4 oz)   SpO2: 96%  93% 92%    Filed Weights   10/28/13 2237 10/29/13 0500 10/29/13 0645  Weight: 136.4  kg (300 lb 11.3 oz) 136.4 kg (300 lb 11.3 oz) 134.7 kg (296 lb 15.4 oz)    Intake/Output from previous day: 11/21 0701 - 11/22 0700 In: 720 [P.O.:720] Out: 4950 [Urine:4950]  General appearance: alert, cooperative, appears stated age, no distress and morbidly obese Resp: Improved air entry bilaterally with a few crackles. No wheezing.  Cardio: regular rate and rhythm, S1, S2 normal, no murmur, click, rub or gallop GI: soft, non-tender; bowel sounds normal; no masses,  no organomegaly Extremities: edema minimal edema Neurologic: No focal deficits  Lab Results:  Basic Metabolic Panel:  Recent Labs Lab 10/24/13 1525 10/27/13 1405 10/28/13 0501 10/29/13 0447  NA 135 133* 130* 131*  K 4.0 4.6 4.2 3.6  CL 102 99 94* 90*  CO2 22 24 23 28   GLUCOSE 204* 307* 412* 360*  BUN 26* 21 20 26*  CREATININE 1.26  1.27 1.35 1.37*  CALCIUM 9.6 8.7 9.0 9.3   CBC:  Recent Labs Lab 10/24/13 1525 10/27/13 1405 10/28/13 0501  WBC 13.4* 13.7* 13.0*  HGB 13.9 12.8* 13.4  HCT 41.6 40.6 40.9  MCV 94.1 96.2 95.1  PLT 274 227 220   Cardiac Enzymes:  Recent Labs Lab 10/27/13 1405  TROPONINI <0.30   BNP (last 3 results)  Recent Labs  06/27/13 1159 10/27/13 1410 10/29/13 0447  PROBNP 76.0 1234.0* 1362.0*   CBG:  Recent Labs Lab 10/28/13 0801 10/28/13 1220 10/28/13 1652 10/28/13 2217 10/29/13 0831  GLUCAP 395* 480* 413* 339* 412*    Recent Results (from the past 240 hour(s))  SURGICAL PCR SCREEN     Status: Abnormal   Collection Time    10/24/13  3:23 PM      Result Value Range Status   MRSA, PCR POSITIVE (*) NEGATIVE Final   Staphylococcus aureus POSITIVE (*) NEGATIVE Final   Comment:            The Xpert SA Assay (FDA     approved for NASAL specimens     in patients over 39 years of age),     is one component of     a comprehensive surveillance     program.  Test performance has     been validated by [employer] for patients greater     than or equal to 35 year old.     It is not intended     to diagnose infection nor to     guide or monitor treatment.      Studies/Results: Dg Chest 2 View  10/27/2013   CLINICAL DATA:  Hypoxia with weakness and shortness of breath.  EXAM: CHEST  2 VIEW  COMPARISON:  2020/03/2413.  FINDINGS: Cardiac enlargement. Satisfactory position of Dual lead pacer. Interstitial and alveolar prominence consistent with congestive heart failure. Bilateral pneumonia less favored. Calcified tortuous aorta. Worsening aeration from priors. Small bilateral effusions.  IMPRESSION: Cardiac enlargement with interstitial and alveolar prominence consistent with congestive heart failure. Worsening aeration from priors.   Electronically Signed   By: Davonna Belling M.D.   On: 10/27/2013 12:06   Dg Chest Port 1 View  10/28/2013   CLINICAL DATA:  CHF.  EXAM:  PORTABLE CHEST - 1 VIEW  COMPARISON:  10/27/2013.  FINDINGS: Mediastinum and hilar structures are normal. Persistent cardiomegaly and pulmonary vascular prominence with bilateral pulmonary interstitial and alveolar prominence consistent with congestive heart failure with pulmonary edema. Pulmonary edema has cleared slightly from prior exam. Small left pleural effusion again noted. No pneumothorax.  Cardiac pacer in stable position.  IMPRESSION: 1. Congestive heart failure with pulmonary edema and small left pleural effusion. Pulmonary edema has cleared slightly from prior exam. 2. Cardiac pacer in stable position.   Electronically Signed   By: Maisie Fus  Register   On: 10/28/2013 07:20    Medications:  Scheduled: . albuterol  2 puff Inhalation QID  . atorvastatin  80 mg Oral q1800  . brimonidine  1 drop Left Eye TID  . Chlorhexidine Gluconate Cloth  6 each Topical Q0600  . docusate sodium  100 mg Oral BID  . dorzolamide-timolol  1 drop Both Eyes BID  . feeding supplement (GLUCERNA SHAKE)  237 mL Oral BID PC  . furosemide  40 mg Intravenous Q12H  . guaiFENesin  600 mg Oral BID  . insulin aspart  0-15 Units Subcutaneous TID WC  . insulin aspart  20 Units Subcutaneous Once  . insulin aspart  8 Units Subcutaneous TID WC  . insulin glargine  65 Units Subcutaneous BID  . latanoprost  1 drop Both Eyes QHS  . levothyroxine  50 mcg Oral QAC breakfast  . lipase/protease/amylase  2 capsule Oral TID WC  . metoCLOPramide (REGLAN) injection  10 mg Intravenous Q8H  . mupirocin ointment  1 application Nasal BID  . mupirocin ointment   Nasal BID  . pantoprazole  40 mg Oral Daily  . senna  1 tablet Oral BID  . venlafaxine XR  150 mg Oral Q breakfast   Continuous:  [id number], guaiFENesin, HYDROmorphone (DILAUDID) injection, hyoscyamine, ondansetron, oxyCODONE  Assessment/Plan:  Principal Problem:   Acute pulmonary edema Active Problems:   OBESITY-MORBID (>100')   OBSTRUCTIVE SLEEP APNEA    HYPERTENSION   DIASTOLIC HEART FAILURE, CHRONIC   Acute respiratory failure with hypoxia   DM type 2 (diabetes mellitus, type 2)    Acute respiratory failure with hypoxia, secondary to Acute pulmonary edema Much improved. This was likely due to fluid overload from IVF and operative fluid shifts. ECHO shows normal systolic LVEF. Could also be acute diastolic heart failure. Decrease dose of Lasix. He is diuresing very well. Will repeat CXR in AM. EKG shows paced rhythm with PVC's. Strict ins and outs, daily weights. Noted that he is off O2. Will likely need Lasix for few days after discharge. Increased BUN/Creat likely from diuresis. Should normalize eventually.  History of chronic diastolic heart failure Please see above. Echocardiogram results noted.  Diabetes mellitus, type II Poorly controlled. Will increase Lantus. SSI. On a different insulin regimen at home which cannot be utilized here.  Status post transurethral resection of bladder tumor on November 19 Management per urology. He has a Foley catheter in place.  History of obstructive sleep apnea Continue CPAP  History of hypertension Monitor blood pressures closely.   History of atrial fibrillation and possible sick sinus syndrome status post pacemaker placement Stable. Monitor on Tele. He is on chronic anticoagulation as well. Warfarin was held for his current surgery. According to Dr. Berneice Heinrich, this can be resumed on 11/23.   Glaucoma Continue eye drops.  Code Status: Full code  DVT Prophylaxis: SCDs  Family Communication: Discussed with the patient   Disposition Plan: He is better but not ready for discharge. Mobilize. Anticipate discharge in AM.     LOS: 3 days   [facility]  [facility] Pager [phone] 10/29/2013, 8:47 AM  If 8PM-8AM, please contact night-coverage at [email or web address], password [facility]

## 2013-10-29 NOTE — Progress Notes (Signed)
Pt ambulated 50 feet without oxygen. Maintained Oxygen saturation between 92-98%. Used [employer] for assistance. Pt did ask to stop half way though for a 1 minute break.

## 2013-10-30 ENCOUNTER — Inpatient Hospital Stay (HOSPITAL_COMMUNITY): Payer: Medicare Other

## 2013-10-30 LAB — BASIC METABOLIC PANEL
BUN: 33 mg/dL — ABNORMAL HIGH (ref 6–23)
CO2: 29 mEq/L (ref 19–32)
Chloride: 92 mEq/L — ABNORMAL LOW (ref 96–112)
GFR calc Af Amer: 54 mL/min — ABNORMAL LOW (ref >90)
Potassium: 4.2 mEq/L (ref 3.5–5.1)

## 2013-10-30 LAB — GLUCOSE, CAPILLARY: Glucose-Capillary: 258 mg/dL — ABNORMAL HIGH (ref 70–99)

## 2013-10-30 MED ORDER — WARFARIN SODIUM 2 MG PO TABS: ORAL_TABLET | ORAL | Status: DC

## 2013-10-30 MED ORDER — FUROSEMIDE 40 MG PO TABS: ORAL_TABLET | ORAL | Status: DC

## 2013-10-30 NOTE — Progress Notes (Signed)
4 Days Post-Op  Subjective:  1 - Bladder Cancer - s/p transurethral resection of bladder tumor, bilateral retrograde pyelograms, right ureteral stent placement 11/19 for large bladder cancer. Path remarkably shows TaG1 disease (though huge volume, and likely some persistent at Rt UO level)  2 -Acute Hypoxia - Pt unable to wean O2 on POD1, hospitalist consult obtained and feel likely acute volume overloand and recieveing diuresis.   Today [name] is continues to feel stronger. Foley working well. Has diuresed nearly 5L total so far.   Objective: Vital signs in last 24 hours: Temp:  [97.6 F (36.4 C)-98.2 F (36.8 C)] 97.7 F (36.5 C) (11/23 0640) Pulse Rate:  [63-64] 63 (11/23 0640) Resp:  [16-20] 16 (11/23 0640) BP: (138-147)/(50-58) 147/57 mmHg (11/23 0640) SpO2:  [92 %-96 %] 93 % (11/23 0640) Weight:  [133.358 kg (294 lb)] 133.358 kg (294 lb) (11/23 0500) Last BM Date: 10/28/13  Intake/Output from previous day: 11/22 0701 - 11/23 0700 In: 840 [P.O.:840] Out: 2700 [Urine:2700] Intake/Output this shift:    General appearance: alert, cooperative, appears stated age and morbidly obese Head: Normocephalic, without obvious abnormality, atraumatic Eyes: conjunctivae/corneas clear. PERRL, EOM's intact. Fundi benign. Ears: normal TM's and external ear canals both ears Nose: Nares normal. Septum midline. Mucosa normal. No drainage or sinus tenderness. Throat: lips, mucosa, and tongue normal; teeth and gums normal Neck: no adenopathy, no carotid bruit, no JVD, supple, symmetrical, trachea midline and thyroid not enlarged, symmetric, no tenderness/mass/nodules Back: symmetric, no curvature. ROM normal. No CVA tenderness. Resp: clear to auscultation bilaterally Chest wall: no tenderness Cardio: regular rate and rhythm, S1, S2 normal, no murmur, click, rub or gallop GI: soft, non-tender; bowel sounds normal; no masses,  no organomegaly Male genitalia: normal, foley c/d/i with yellow  urine Extremities: extremities normal, atraumatic, no cyanosis or edema Pulses: 2+ and symmetric Skin: Skin color, texture, turgor normal. No rashes or lesions Lymph nodes: Cervical, supraclavicular, and axillary nodes normal. Neurologic: Grossly normal  Lab Results:   Recent Labs  10/27/13 1405 10/28/13 0501  WBC 13.7* 13.0*  HGB 12.8* 13.4  HCT 40.6 40.9  PLT 227 220   BMET  Recent Labs  10/29/13 0447 10/30/13 0532  NA 131* 131*  K 3.6 4.2  CL 90* 92*  CO2 28 29  GLUCOSE 360* 278*  BUN 26* 33*  CREATININE 1.37* 1.46*  CALCIUM 9.3 9.4   PT/INR No results found for this basename: LABPROT, INR,  in the last 72 hours ABG No results found for this basename: PHART, PCO2, PO2, HCO3,  in the last 72 hours  Studies/Results: No results found.  Anti-infectives: Anti-infectives   Start     Dose/Rate Route Frequency Ordered Stop   10/26/13 0000  sulfamethoxazole-trimethoprim (BACTRIM DS) 800-160 MG per tablet     1 tablet Oral 2 times daily 10/26/13 1645     10/25/13 1616  gentamicin (GARAMYCIN) 500 mg in dextrose 5 % 100 mL IVPB     500 mg 112.5 mL/hr over 60 Minutes Intravenous 30 min pre-op 10/25/13 1616 10/26/13 1527      Assessment/Plan:  1 - Bladder Cancer - favorable path discussed with pt. Will likely plan for repeat TURBT with rt ureteroscopy and possible tumor ablation and stent change in about 3 mos to maximally debulk his favorable tumor and verify no recurrence.   Agree safe to restart coumadin today. Ready for DC from Uro perspecive anytime.   2 -Acute Hypoxia - Improving. Greatly appreciate hospitalist help.    Shi Grose,  Melaney Tellefsen 10/30/2013

## 2013-10-30 NOTE — Progress Notes (Signed)
Pt wanted to self administer CPAP tonight, RT went by to check on and Pt tolerating well at this time, RT to monitor and assess as needed.

## 2013-10-30 NOTE — Discharge Summary (Signed)
[facility]  Physician Discharge Summary   Patient ID: [name] MRN: [id number] DOB/AGE: 06/04/1941 72 y.o.  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 Patient to follow up with Urology 11/24. Foley will be removed then.  2. PT/INR to be checked at follow up with PCP 3. Needs close follow up for pulmonary edema. Patient being discharged on Lasix and further dose determination to be made at follow up.  4. Please note that he is being discharged on Bactrim which can interact with warfarin but its prescribed only for 3 days and so shouldn't cause a significant interaction as patient is subtherapeutic to begin with.  DISCHARGE CONDITION: fair  Diet recommendation: Mod Carb  Filed Weights   10/29/13 0500 10/29/13 0645 10/30/13 0500  Weight: 136.4 kg (300 lb 11.3 oz) 134.7 kg (296 lb 15.4 oz) 133.358 kg (294 lb)    INITIAL HISTORY: [name] is an 72 y.o. male with a past medical history of atrial fibrillation, obstructive sleep apnea, diabetes, on chronic anticoagulation, who underwent a transurethral resection of bladder tumor on November 19. He was kept overnight for pain control. Patient was seen by his attending provider on 11/20 and it was noted that patient was hypoxic. So, they were unable to discharge him and hospitalist was called for consultation. Patient was complaining of cough and congestion in his chest that started the previous night. He denied any chest pain, but has been short of breath. Denied any nausea, vomiting, or abdominal pain. He has had some leg swelling, but not significantly. He mentioned that he was on Lasix for leg swelling till about 2-3  months ago, when it was discontinued by his cardiologist. He was having symptoms suggestive of orthopnea.   Consultations:  Urology  Procedures: 2D ECHO 11/20  Study Conclusions - Left ventricle: The cavity size was normal. Wall thickness was increased in a pattern of moderate LVH. Systolic function was normal. The estimated ejection fraction was in the range of 55% to 60%. Wall motion was normal; there were no regional wall motion abnormalities. - Aortic valve: There was mild stenosis. Valve area: 1.85cm^2(VTI). Valve area: 1.68cm^2 (Vmax). - Left atrium: The atrium was mildly dilated. - Right atrium: The atrium was mildly dilated.   HOSPITAL COURSE:   Acute respiratory failure with hypoxia, secondary to Acute pulmonary edema  This has improved significantly clinically. CXR is lagging. This was likely due to fluid overload from IVF and operative fluid shifts. ECHO shows normal systolic LVEF. Could also be acute diastolic heart failure. He will be discharged on oral Lasix and will need close follow up with PCP to determine further course. He has diuresed significant amount of urine and has been negative balance of 9 liters. He has lost about 8 kg. EKG shows paced rhythm with PVC's. He required oxygen initially but then was weaned off. He ambulated yesterday without drop in O2 sats on Room air.   History of chronic diastolic heart failure  Please see above. Echocardiogram results noted. Follow up with PCP.  Diabetes mellitus, type II  This has been poorly controlled primarily due to inability to give his home regimen. He may resume his home medications.   Status  post transurethral resection of bladder tumor on November 19  Management per urology. He has a Foley catheter in place. He will see them tomorrow in follow up.  History of obstructive sleep apnea  Continue CPAP   History of hypertension  Continue anti-HTN medications.  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 He is on chronic anticoagulation as well. Warfarin was held for his current surgery. According to Dr. Berneice Heinrich, this can be resumed on 11/23. Noted he is being discharged on bactrim and potential for interaction but its prescribed only for 3 days and so shouldn't cause a significant interaction as patient is subtherapeutic to begin with.  Glaucoma  Continue eye drops.   Patient remains stable. He is keen on going home. He will be discharged.   PERTINENT LABS:  The results of significant diagnostics from this hospitalization (including imaging, microbiology, ancillary and laboratory) are listed below for reference.    Microbiology: Recent Results (from the past 240 hour(s))  SURGICAL PCR SCREEN     Status: Abnormal   Collection Time    10/24/13  3:23 PM      Result Value Range Status   MRSA, PCR POSITIVE (*) NEGATIVE Final   Staphylococcus aureus POSITIVE (*) NEGATIVE Final   Comment:            The Xpert SA Assay (FDA     approved for NASAL specimens     in patients over 36 years of age),     is one component of     a comprehensive surveillance     program.  Test performance has     been validated by [employer] for patients greater     than or equal to 23 year old.     It is not intended     to diagnose infection nor to     guide or monitor treatment.     Labs: Basic Metabolic Panel:  Recent Labs Lab 10/24/13 1525 10/27/13 1405 10/28/13 0501 10/29/13 0447 10/30/13 0532  NA 135 133* 130* 131* 131*  K 4.0 4.6 4.2 3.6 4.2  CL 102 99 94* 90* 92*  CO2 22 24 23 28 29   GLUCOSE 204* 307* 412* 360* 278*  BUN 26* 21 20 26* 33*  CREATININE 1.26 1.27 1.35 1.37* 1.46*  CALCIUM 9.6 8.7 9.0 9.3 9.4   CBC:  Recent Labs Lab 10/24/13 1525 10/27/13 1405 10/28/13 0501  WBC 13.4* 13.7* 13.0*  HGB 13.9 12.8* 13.4  HCT 41.6 40.6 40.9  MCV 94.1 96.2 95.1  PLT 274 227 220   Cardiac Enzymes:  Recent  Labs Lab 10/27/13 1405  TROPONINI <0.30   BNP: BNP (last 3 results)  Recent Labs  06/27/13 1159 10/27/13 1410 10/29/13 0447  PROBNP 76.0 1234.0* 1362.0*   CBG:  Recent Labs Lab 10/29/13 0831 10/29/13 1131 10/29/13 1741 10/29/13 2045 10/30/13 0830  GLUCAP 412* 382* 227* 263* 258*     IMAGING STUDIES Ct Abdomen Pelvis Wo Contrast  10/01/2013   CLINICAL DATA:  Hematuria, lower abdominal pain and dysuria.  EXAM: CT ABDOMEN AND PELVIS WITHOUT  TECHNIQUE: Multidetector CT imaging of the abdomen and pelvis was performed following the standard protocol without IV contrast.  COMPARISON:  CT of the abdomen and pelvis with contrast on 06/16/2011.  FINDINGS: High density soft tissue abnormality in the posterior bladder near the trigone measures roughly 3 x 6 cm. While some of  this material may be clotted blood, there is suspicion that this may represent predominantly bladder tumor. No significant associated bladder distension is identified. Correlation with elective cystoscopy is recommended.  The kidneys themselves show no evidence of obstruction or calculi. The ureters are of normal caliber and show no calculi.  The gallbladder has been removed. Stable pancreatic calcifications are identified related to chronic pancreatitis. Unenhanced appearance of the liver, spleen and adrenal glands are stable and unremarkable. There is no evidence of bowel dilatation. No free fluid or abscess is identified.  Some pelvic lymph node prominence is identified. Enlarged distal left external iliac/ high inguinal lymph node measures 1.6 cm in short axis and is located just lateral to the bladder. Medial high inguinal node on the right measures approximately 9 mm in short axis.  Bony structures show diffuse degenerative changes of the spine which are stable.  IMPRESSION: High density in the posterior bladder near the trigone. While some of this density may be consistent with clotted blood, there is suspicion that  there is a component of bladder tumor and correlation with elective cystoscopy is recommended. At least one enlarged distal left iliac/high inguinal lymph node is identified. There is no evidence of hydronephrosis.   Electronically Signed   By: Irish Lack M.D.   On: 10/01/2013 10:02   Dg Chest 2 View  10/27/2013   CLINICAL DATA:  Hypoxia with weakness and shortness of breath.  EXAM: CHEST  2 VIEW  COMPARISON:  2020/09/2013.  FINDINGS: Cardiac enlargement. Satisfactory position of Dual lead pacer. Interstitial and alveolar prominence consistent with congestive heart failure. Bilateral pneumonia less favored. Calcified tortuous aorta. Worsening aeration from priors. Small bilateral effusions.  IMPRESSION: Cardiac enlargement with interstitial and alveolar prominence consistent with congestive heart failure. Worsening aeration from priors.   Electronically Signed   By: Davonna Belling M.D.   On: 10/27/2013 12:06   Dg Chest 2 View  10/25/2013   CLINICAL DATA:  Preoperative for tumor removal from urinary bladder. Shortness of breath. Diabetes.  EXAM: CHEST  2 VIEW  COMPARISON:  10/01/2013; 06/03/2011  FINDINGS: Dual lead pacer noted. Mitral calcification. Upper normal heart size.  Thoracic spondylosis noted.  No acute findings.  IMPRESSION: 1. Stable appearance of the chest, with dual lead pacer, thoracic spondylosis, and no acute findings.   Electronically Signed   By: Herbie Baltimore M.D.   On: 10/25/2013 00:07   Dg Abd 1 View  10/26/2013   CLINICAL DATA:  Bilateral retrograde pyelogram with right-sided ureteral stent placement  EXAM: DG C-ARM 1-60 MIN - NRPT [facility]; ABDOMEN - 1 VIEW  COMPARISON:  CT abdomen and pelvis -10/01/2013  FLUOROSCOPY TIME:  30 seconds  FINDINGS: Eight spot intraoperative fluoroscopic images during bilateral retrograde pyelogram and right-sided double-J ureteral stent placement are provided for review.  Images demonstrate sequential selective cannulation and opacification of the  bilateral ureters. No discrete filling defects are seen along the opacified course of either ureter or collecting system. No evidence of hydronephrosis.  Subsequent images demonstrate placement of a right-sided double-J ureteral stent with superior end uncoiled within the right renal pelvis and inferior end coiled overlying the expected location of the urinary bladder.  IMPRESSION: Bilateral retrograde pyelogram and right-sided ureteral stent placement as above. No discrete filling defects are seen along the opacified course of either ureter or collecting system. Further evaluation could be performed with dual phase renal CT as clinically indicated.   Electronically Signed   By: Simonne Come M.D.   On:  10/26/2013 17:18   Dg Chest Port 1 View  10/30/2013   CLINICAL DATA:  Congestive heart failure.  EXAM: PORTABLE CHEST - 1 VIEW  COMPARISON:  October 28, 2013.  FINDINGS: Cardiomediastinal silhouette appears stable. Interstitial and vascular prominence noted bilaterally on prior exam has improved consistent with improving edema. No pleural effusion or pneumothorax is seen currently. Bony thorax is intact. Stable left-sided pacemaker.  IMPRESSION: Improving bilateral pulmonary edema.   Electronically Signed   By: Roque Lias M.D.   On: 10/30/2013 07:47   Dg Chest Port 1 View  10/28/2013   CLINICAL DATA:  CHF.  EXAM: PORTABLE CHEST - 1 VIEW  COMPARISON:  10/27/2013.  FINDINGS: Mediastinum and hilar structures are normal. Persistent cardiomegaly and pulmonary vascular prominence with bilateral pulmonary interstitial and alveolar prominence consistent with congestive heart failure with pulmonary edema. Pulmonary edema has cleared slightly from prior exam. Small left pleural effusion again noted. No pneumothorax. Cardiac pacer in stable position.  IMPRESSION: 1. Congestive heart failure with pulmonary edema and small left pleural effusion. Pulmonary edema has cleared slightly from prior exam. 2. Cardiac pacer in  stable position.   Electronically Signed   By: Maisie Fus  Register   On: 10/28/2013 07:20   Dg C-arm 1-60 Min-no Report  10/26/2013   CLINICAL DATA:  Bilateral retrograde pyelogram with right-sided ureteral stent placement  EXAM: DG C-ARM 1-60 MIN - NRPT [facility]; ABDOMEN - 1 VIEW  COMPARISON:  CT abdomen and pelvis -10/01/2013  FLUOROSCOPY TIME:  30 seconds  FINDINGS: Eight spot intraoperative fluoroscopic images during bilateral retrograde pyelogram and right-sided double-J ureteral stent placement are provided for review.  Images demonstrate sequential selective cannulation and opacification of the bilateral ureters. No discrete filling defects are seen along the opacified course of either ureter or collecting system. No evidence of hydronephrosis.  Subsequent images demonstrate placement of a right-sided double-J ureteral stent with superior end uncoiled within the right renal pelvis and inferior end coiled overlying the expected location of the urinary bladder.  IMPRESSION: Bilateral retrograde pyelogram and right-sided ureteral stent placement as above. No discrete filling defects are seen along the opacified course of either ureter or collecting system. Further evaluation could be performed with dual phase renal CT as clinically indicated.   Electronically Signed   By: Simonne Come M.D.   On: 10/26/2013 17:18    DISCHARGE EXAMINATION: Filed Vitals:   10/29/13 1500 10/29/13 2045 10/30/13 0500 10/30/13 0640  BP: 138/58 141/50  147/57  Pulse: 64 64  63  Temp: 98.2 F (36.8 C) 97.6 F (36.4 C)  97.7 F (36.5 C)  TempSrc: Oral Oral  Oral  Resp: 16 20  16   Height:      Weight:   133.358 kg (294 lb)   SpO2: 94% 96%  93%   General appearance: alert, cooperative, appears stated age, no distress and moderately obese Resp: Much improved aeration. Only a few crackles at bases. Cardio: regular rate and rhythm, S1, S2 normal, no murmur, click, rub or gallop  DISPOSITION: Home  Discharge Orders   Future  Appointments Provider Department Dept Phone   11/18/2013 11:15 AM Barbaraann Share, MD [facility] [phone]   11/18/2013 1:15 PM Romero Belling, MD [facility] [phone]   12/27/2013 1:00 PM Romero Belling, MD [facility] [phone]   01/04/2014 12:00 PM [facility] [employer] [id number]   Future Orders Complete By Expires   Diet Carb Modified  As directed  Discharge instructions  As directed    Comments:     Follow up with Dr. Berneice Heinrich as scheduled. Please make appointment with Dr. Everardo All as soon as possible. Seek attention for worsening shortness of breath or chest pains. See your doctor to have your PT/INR (warfarin) levels checked in few days. Watch out closely for bleeding.   Increase activity slowly  As directed       ALLERGIES:  Allergies  Allergen Reactions  . Pioglitazone     ACTOS  REACTION: Edema    Current Discharge Medication List    START taking these medications   Details  furosemide (LASIX) 40 MG tablet Take 1 tablet twice daily for 3 days, then take 1 tablet once daily till seen by PCP. Qty: 30 tablet, Refills: 0    oxyCODONE-acetaminophen (ROXICET) 5-325 MG per tablet Take 1 tablet by mouth every 4 (four) hours as needed for moderate pain. Post-operatively Qty: 30 tablet, Refills: 0    senna-docusate (SENOKOT-S) 8.6-50 MG per tablet Take 1 tablet by mouth 2 (two) times daily. While taking pain meds to prevent constipation Qty: 30 tablet, Refills: 0    sulfamethoxazole-trimethoprim (BACTRIM DS) 800-160 MG per tablet Take 1 tablet by mouth 2 (two) times daily. X 3 days. Begin day prior to next Urology appointment for catheter removal. Qty: 6 tablet, Refills: 0      CONTINUE these medications which have CHANGED   Details  warfarin (COUMADIN) 2 MG tablet Take 4 mg every Wednesday. Take 6 mg every Sunday, Monday, Tuesday, Thursday, Friday, Saturday. May resume tonight.       CONTINUE these medications which have NOT CHANGED   Details  acetaminophen (TYLENOL) 500 MG tablet Take 500 mg by mouth every 6 (six) hours as needed for pain.    ALPRAZolam (XANAX) 0.25 MG tablet Take 1 tablet (0.25 mg total) by mouth 3 (three) times daily as needed for anxiety or sleep. Qty: 50 tablet, Refills: 0    Ascorbic Acid (VITAMIN C) 500 MG tablet Take 500 mg by mouth daily.      brimonidine (ALPHAGAN P) 0.1 % SOLN Place 1 drop into the left eye 3 (three) times daily.     cholecalciferol (VITAMIN D) 1000 UNITS tablet Take 1,000 Units by mouth daily.    dorzolamide-timolol (COSOPT) 22.3-6.8 MG/ML ophthalmic solution Place 1 drop into both eyes 2 (two) times daily.     fluticasone (FLONASE) 50 MCG/ACT nasal spray Place 2 sprays into the nose daily.     guaiFENesin (MUCINEX) 600 MG 12 hr tablet Take 600 mg by mouth 2 (two) times daily as needed for to loosen phlegm.    insulin regular human CONCENTRATED (HUMULIN R) 500 UNIT/ML SOLN injection 300 units with breakfast, 300 units with lunch 300 units with supper, 125 units with bedtime snack Qty: 3 vial, Refills: 11    latanoprost (XALATAN) 0.005 % ophthalmic solution Place 1 drop into both eyes at bedtime.     levothyroxine (SYNTHROID, LEVOTHROID) 50 MCG tablet Take 50 mcg by mouth daily before breakfast.    Multiple Vitamin (MULTIVITAMIN WITH MINERALS) TABS tablet Take 1 tablet by mouth daily.    omeprazole (PRILOSEC) 20 MG capsule Take 1 capsule (20 mg total) by mouth daily. Qty: 30 capsule, Refills: 4    Pancrelipase, Lip-Prot-Amyl, 25000 UNITS CPEP Take 25,000 Units by mouth 3 (three) times daily.     Probiotic Product (PROBIOTIC ACIDOPHILUS) CAPS Take 1 capsule by mouth daily.     rosuvastatin (CRESTOR) 40 MG tablet  Take 20 mg by mouth every evening.    venlafaxine XR (EFFEXOR-XR) 150 MG 24 hr capsule Take 1 capsule (150 mg total) by mouth daily with breakfast. Qty: 30 capsule, Refills: 11    albuterol (PROVENTIL  HFA;VENTOLIN HFA) 108 (90 BASE) MCG/ACT inhaler Inhale 2 puffs into the lungs every 6 (six) hours as needed for wheezing.    aspirin 325 MG tablet Take 325 mg by mouth daily.    OMEGA-3 1000 MG CAPS Take 1 capsule by mouth daily.       STOP taking these medications     traMADol (ULTRAM) 50 MG tablet        Follow-up Information   Follow up with Sebastian Ache, MD On 11/08/2013. (We will also arrange for nurse visit early next week for catheter removal.)    Specialty:  Urology   Contact information:   [address] [place] [address] [phone]       Schedule an appointment as soon as possible for a visit with Romero Belling, MD.   Specialty:  Endocrinology   Contact information:   [address] [place] [address] [phone]       TOTAL DISCHARGE TIME: 35 mins  [facility]  [facility] Pager [phone]  10/30/2013, 10:53 AM

## 2013-11-01 ENCOUNTER — Encounter (HOSPITAL_COMMUNITY): Payer: Self-pay | Admitting: Emergency Medicine

## 2013-11-01 ENCOUNTER — Inpatient Hospital Stay (HOSPITAL_COMMUNITY)
Admission: EM | Admit: 2013-11-01 | Discharge: 2013-11-04 | DRG: 253 | Disposition: A | Discharge status: Home / Self Care | Payer: Medicare Other | Attending: Vascular Surgery | Admitting: Vascular Surgery

## 2013-11-01 DIAGNOSIS — J449 Chronic obstructive pulmonary disease, unspecified: Secondary | ICD-10-CM | POA: Diagnosis present

## 2013-11-01 DIAGNOSIS — I739 Peripheral vascular disease, unspecified: Secondary | ICD-10-CM

## 2013-11-01 DIAGNOSIS — K219 Gastro-esophageal reflux disease without esophagitis: Secondary | ICD-10-CM | POA: Diagnosis present

## 2013-11-01 DIAGNOSIS — Z6841 Body Mass Index (BMI) 40.0 and over, adult: Secondary | ICD-10-CM

## 2013-11-01 DIAGNOSIS — F3289 Other specified depressive episodes: Secondary | ICD-10-CM | POA: Diagnosis present

## 2013-11-01 DIAGNOSIS — Z7901 Long term (current) use of anticoagulants: Secondary | ICD-10-CM

## 2013-11-01 DIAGNOSIS — Z79899 Other long term (current) drug therapy: Secondary | ICD-10-CM

## 2013-11-01 DIAGNOSIS — J4489 Other specified chronic obstructive pulmonary disease: Secondary | ICD-10-CM | POA: Diagnosis present

## 2013-11-01 DIAGNOSIS — H409 Unspecified glaucoma: Secondary | ICD-10-CM | POA: Diagnosis present

## 2013-11-01 DIAGNOSIS — I998 Other disorder of circulatory system: Secondary | ICD-10-CM | POA: Diagnosis present

## 2013-11-01 DIAGNOSIS — Z7982 Long term (current) use of aspirin: Secondary | ICD-10-CM

## 2013-11-01 DIAGNOSIS — I70209 Unspecified atherosclerosis of native arteries of extremities, unspecified extremity: Secondary | ICD-10-CM | POA: Diagnosis present

## 2013-11-01 DIAGNOSIS — F411 Generalized anxiety disorder: Secondary | ICD-10-CM | POA: Diagnosis present

## 2013-11-01 DIAGNOSIS — F329 Major depressive disorder, single episode, unspecified: Secondary | ICD-10-CM | POA: Diagnosis present

## 2013-11-01 DIAGNOSIS — I2510 Atherosclerotic heart disease of native coronary artery without angina pectoris: Secondary | ICD-10-CM | POA: Diagnosis present

## 2013-11-01 DIAGNOSIS — I4891 Unspecified atrial fibrillation: Secondary | ICD-10-CM | POA: Diagnosis present

## 2013-11-01 DIAGNOSIS — Z8249 Family history of ischemic heart disease and other diseases of the circulatory system: Secondary | ICD-10-CM

## 2013-11-01 DIAGNOSIS — K5900 Constipation, unspecified: Secondary | ICD-10-CM | POA: Diagnosis present

## 2013-11-01 DIAGNOSIS — Z87891 Personal history of nicotine dependence: Secondary | ICD-10-CM

## 2013-11-01 DIAGNOSIS — M79609 Pain in unspecified limb: Secondary | ICD-10-CM

## 2013-11-01 DIAGNOSIS — R609 Edema, unspecified: Secondary | ICD-10-CM

## 2013-11-01 DIAGNOSIS — G609 Hereditary and idiopathic neuropathy, unspecified: Secondary | ICD-10-CM | POA: Diagnosis present

## 2013-11-01 DIAGNOSIS — Z8551 Personal history of malignant neoplasm of bladder: Secondary | ICD-10-CM

## 2013-11-01 DIAGNOSIS — I10 Essential (primary) hypertension: Secondary | ICD-10-CM | POA: Diagnosis present

## 2013-11-01 DIAGNOSIS — N289 Disorder of kidney and ureter, unspecified: Secondary | ICD-10-CM | POA: Diagnosis present

## 2013-11-01 DIAGNOSIS — Z800 Family history of malignant neoplasm of digestive organs: Secondary | ICD-10-CM

## 2013-11-01 DIAGNOSIS — Z86718 Personal history of other venous thrombosis and embolism: Secondary | ICD-10-CM

## 2013-11-01 DIAGNOSIS — E785 Hyperlipidemia, unspecified: Secondary | ICD-10-CM | POA: Diagnosis present

## 2013-11-01 DIAGNOSIS — Z950 Presence of cardiac pacemaker: Secondary | ICD-10-CM

## 2013-11-01 DIAGNOSIS — I743 Embolism and thrombosis of arteries of the lower extremities: Principal | ICD-10-CM | POA: Diagnosis present

## 2013-11-01 DIAGNOSIS — E039 Hypothyroidism, unspecified: Secondary | ICD-10-CM | POA: Diagnosis present

## 2013-11-01 DIAGNOSIS — I70202 Unspecified atherosclerosis of native arteries of extremities, left leg: Secondary | ICD-10-CM | POA: Diagnosis present

## 2013-11-01 DIAGNOSIS — G4733 Obstructive sleep apnea (adult) (pediatric): Secondary | ICD-10-CM | POA: Diagnosis present

## 2013-11-01 DIAGNOSIS — Z794 Long term (current) use of insulin: Secondary | ICD-10-CM

## 2013-11-01 DIAGNOSIS — M7989 Other specified soft tissue disorders: Secondary | ICD-10-CM

## 2013-11-01 DIAGNOSIS — E119 Type 2 diabetes mellitus without complications: Secondary | ICD-10-CM | POA: Diagnosis present

## 2013-11-01 LAB — POCT I-STAT, CHEM 8
BUN: 42 mg/dL — ABNORMAL HIGH (ref 6–23)
Calcium, Ion: 1.15 mmol/L (ref 1.13–1.30)
Creatinine, Ser: 2.2 mg/dL — ABNORMAL HIGH (ref 0.50–1.35)
Glucose, Bld: 264 mg/dL — ABNORMAL HIGH (ref 70–99)
HCT: 47 % (ref 39.0–52.0)
Hemoglobin: 16 g/dL (ref 13.0–17.0)
TCO2: 24 mmol/L (ref 0–100)

## 2013-11-01 LAB — GLUCOSE, CAPILLARY: Glucose-Capillary: 344 mg/dL — ABNORMAL HIGH (ref 70–99)

## 2013-11-01 LAB — CBC
HCT: 41.2 % (ref 39.0–52.0)
MCH: 30.6 pg (ref 26.0–34.0)
MCHC: 33.3 g/dL (ref 30.0–36.0)
MCV: 92 fL (ref 78.0–100.0)
RDW: 14.4 % (ref 11.5–15.5)

## 2013-11-01 LAB — PROTIME-INR: Prothrombin Time: 13.2 seconds (ref 11.6–15.2)

## 2013-11-01 MED ORDER — ATORVASTATIN CALCIUM 80 MG PO TABS
80.0000 mg | ORAL_TABLET | Freq: Every day | ORAL | Status: DC
  Administered 2013-11-03: 40 mg via ORAL
  Filled 2013-11-01 (×3): qty 1

## 2013-11-01 MED ORDER — LABETALOL HCL 5 MG/ML IV SOLN
10.0000 mg | INTRAVENOUS | Status: DC | PRN
  Filled 2013-11-01: qty 4

## 2013-11-01 MED ORDER — POTASSIUM CHLORIDE CRYS ER 20 MEQ PO TBCR: 20.0000 meq | EXTENDED_RELEASE_TABLET | Freq: Once | ORAL | Status: DC

## 2013-11-01 MED ORDER — VENLAFAXINE HCL ER 150 MG PO CP24
150.0000 mg | ORAL_CAPSULE | Freq: Every day | ORAL | Status: DC
  Administered 2013-11-02 – 2013-11-04 (×3): 150 mg via ORAL
  Filled 2013-11-01 (×4): qty 1

## 2013-11-01 MED ORDER — GUAIFENESIN ER 600 MG PO TB12
600.0000 mg | ORAL_TABLET | Freq: Two times a day (BID) | ORAL | Status: DC | PRN
  Filled 2013-11-01: qty 1

## 2013-11-01 MED ORDER — ALUM & MAG HYDROXIDE-SIMETH 200-200-20 MG/5ML PO SUSP: 15.0000 mL | ORAL | Status: DC | PRN

## 2013-11-01 MED ORDER — INSULIN ASPART 100 UNIT/ML ~~LOC~~ SOLN
0.0000 [IU] | Freq: Three times a day (TID) | SUBCUTANEOUS | Status: DC
  Administered 2013-11-02: 9 [IU] via SUBCUTANEOUS
  Filled 2013-11-01: qty 1

## 2013-11-01 MED ORDER — BRIMONIDINE TARTRATE 0.15 % OP SOLN
1.0000 [drp] | Freq: Three times a day (TID) | OPHTHALMIC | Status: DC
  Filled 2013-11-01: qty 5

## 2013-11-01 MED ORDER — HEPARIN (PORCINE) IN NACL 100-0.45 UNIT/ML-% IJ SOLN
1800.0000 [IU]/h | INTRAMUSCULAR | Status: DC
  Administered 2013-11-01: 1400 [IU]/h via INTRAVENOUS
  Filled 2013-11-01 (×4): qty 250

## 2013-11-01 MED ORDER — ADULT MULTIVITAMIN W/MINERALS CH
1.0000 | ORAL_TABLET | Freq: Every day | ORAL | Status: DC
  Administered 2013-11-01 – 2013-11-04 (×4): 1 via ORAL
  Filled 2013-11-01 (×4): qty 1

## 2013-11-01 MED ORDER — SODIUM CHLORIDE 0.9 % IV SOLN
INTRAVENOUS | Status: DC
  Administered 2013-11-01 – 2013-11-02 (×2): via INTRAVENOUS

## 2013-11-01 MED ORDER — SULFAMETHOXAZOLE-TMP DS 800-160 MG PO TABS
1.0000 | ORAL_TABLET | Freq: Two times a day (BID) | ORAL | Status: DC
  Administered 2013-11-01 – 2013-11-04 (×6): 1 via ORAL
  Filled 2013-11-01 (×7): qty 1

## 2013-11-01 MED ORDER — DORZOLAMIDE HCL-TIMOLOL MAL 2-0.5 % OP SOLN
1.0000 [drp] | Freq: Two times a day (BID) | OPHTHALMIC | Status: DC
  Administered 2013-11-01 – 2013-11-04 (×5): 1 [drp] via OPHTHALMIC
  Filled 2013-11-01: qty 10

## 2013-11-01 MED ORDER — ALPRAZOLAM 0.25 MG PO TABS: 0.2500 mg | ORAL_TABLET | Freq: Three times a day (TID) | ORAL | Status: DC | PRN

## 2013-11-01 MED ORDER — SENNA 8.6 MG PO TABS
1.0000 | ORAL_TABLET | Freq: Two times a day (BID) | ORAL | Status: DC
  Administered 2013-11-02: 8.6 mg via ORAL
  Filled 2013-11-01 (×3): qty 1

## 2013-11-01 MED ORDER — INSULIN ASPART 100 UNIT/ML ~~LOC~~ SOLN
0.0000 [IU] | Freq: Every day | SUBCUTANEOUS | Status: DC
  Administered 2013-11-01 – 2013-11-02 (×2): 4 [IU] via SUBCUTANEOUS

## 2013-11-01 MED ORDER — HYDRALAZINE HCL 20 MG/ML IJ SOLN: 10.0000 mg | INTRAMUSCULAR | Status: DC | PRN

## 2013-11-01 MED ORDER — ASPIRIN 325 MG PO TABS
325.0000 mg | ORAL_TABLET | Freq: Every day | ORAL | Status: DC
  Administered 2013-11-01 – 2013-11-04 (×4): 325 mg via ORAL
  Filled 2013-11-01 (×4): qty 1

## 2013-11-01 MED ORDER — PANTOPRAZOLE SODIUM 40 MG PO TBEC: 40.0000 mg | DELAYED_RELEASE_TABLET | Freq: Every day | ORAL | Status: DC

## 2013-11-01 MED ORDER — VITAMIN C 500 MG PO TABS
500.0000 mg | ORAL_TABLET | Freq: Every day | ORAL | Status: DC
  Administered 2013-11-01 – 2013-11-04 (×4): 500 mg via ORAL
  Filled 2013-11-01 (×4): qty 1

## 2013-11-01 MED ORDER — VITAMIN D3 25 MCG (1000 UNIT) PO TABS
1000.0000 [IU] | ORAL_TABLET | Freq: Every day | ORAL | Status: DC
  Administered 2013-11-02 – 2013-11-04 (×3): 1000 [IU] via ORAL
  Filled 2013-11-01 (×3): qty 1

## 2013-11-01 MED ORDER — DOCUSATE SODIUM 100 MG PO CAPS
100.0000 mg | ORAL_CAPSULE | Freq: Two times a day (BID) | ORAL | Status: DC
  Administered 2013-11-02: 100 mg via ORAL
  Filled 2013-11-01 (×3): qty 1

## 2013-11-01 MED ORDER — MORPHINE SULFATE 2 MG/ML IJ SOLN: 2.0000 mg | INTRAMUSCULAR | Status: DC | PRN

## 2013-11-01 MED ORDER — ONDANSETRON HCL 4 MG/2ML IJ SOLN: 4.0000 mg | Freq: Four times a day (QID) | INTRAMUSCULAR | Status: DC | PRN

## 2013-11-01 MED ORDER — OMEGA-3-ACID ETHYL ESTERS 1 G PO CAPS
1.0000 g | ORAL_CAPSULE | Freq: Every day | ORAL | Status: DC
  Administered 2013-11-01 – 2013-11-04 (×4): 1 g via ORAL
  Filled 2013-11-01 (×4): qty 1

## 2013-11-01 MED ORDER — LEVOTHYROXINE SODIUM 50 MCG PO TABS
50.0000 ug | ORAL_TABLET | Freq: Every day | ORAL | Status: DC
  Administered 2013-11-02 – 2013-11-04 (×3): 50 ug via ORAL
  Filled 2013-11-01 (×4): qty 1

## 2013-11-01 MED ORDER — RISAQUAD PO CAPS
1.0000 | ORAL_CAPSULE | Freq: Every day | ORAL | Status: DC
  Administered 2013-11-01 – 2013-11-04 (×4): 1 via ORAL
  Filled 2013-11-01 (×4): qty 1

## 2013-11-01 MED ORDER — PROBIOTIC ACIDOPHILUS BIOBEADS PO CAPS: 1.0000 | ORAL_CAPSULE | Freq: Every day | ORAL | Status: DC

## 2013-11-01 MED ORDER — PHENOL 1.4 % MT LIQD: 1.0000 | OROMUCOSAL | Status: DC | PRN

## 2013-11-01 MED ORDER — LATANOPROST 0.005 % OP SOLN
1.0000 [drp] | Freq: Every day | OPHTHALMIC | Status: DC
  Administered 2013-11-01 – 2013-11-03 (×4): 1 [drp] via OPHTHALMIC
  Filled 2013-11-01: qty 2.5

## 2013-11-01 MED ORDER — FLUTICASONE PROPIONATE 50 MCG/ACT NA SUSP
2.0000 | Freq: Every day | NASAL | Status: DC
  Administered 2013-11-02 – 2013-11-04 (×3): 2 via NASAL
  Filled 2013-11-01: qty 16

## 2013-11-01 MED ORDER — GUAIFENESIN-DM 100-10 MG/5ML PO SYRP: 15.0000 mL | ORAL_SOLUTION | ORAL | Status: DC | PRN

## 2013-11-01 MED ORDER — PANTOPRAZOLE SODIUM 40 MG PO TBEC
40.0000 mg | DELAYED_RELEASE_TABLET | Freq: Every day | ORAL | Status: DC
  Administered 2013-11-01 – 2013-11-04 (×4): 40 mg via ORAL
  Filled 2013-11-01 (×5): qty 1

## 2013-11-01 MED ORDER — PANCRELIPASE (LIP-PROT-AMYL) 25000 UNITS PO CPEP
25000.0000 [IU] | ORAL_CAPSULE | Freq: Three times a day (TID) | ORAL | Status: DC
  Administered 2013-11-03: 17:00:00 via ORAL
  Administered 2013-11-03 – 2013-11-04 (×3): 25000 [IU] via ORAL
  Filled 2013-11-01 (×8): qty 1

## 2013-11-01 MED ORDER — OXYCODONE-ACETAMINOPHEN 5-325 MG PO TABS: 1.0000 | ORAL_TABLET | ORAL | Status: DC | PRN

## 2013-11-01 MED ORDER — SENNOSIDES-DOCUSATE SODIUM 8.6-50 MG PO TABS
1.0000 | ORAL_TABLET | Freq: Two times a day (BID) | ORAL | Status: DC
  Administered 2013-11-03 – 2013-11-04 (×3): 1 via ORAL
  Filled 2013-11-01 (×8): qty 1

## 2013-11-01 MED ORDER — BRIMONIDINE TARTRATE 0.2 % OP SOLN
1.0000 [drp] | Freq: Three times a day (TID) | OPHTHALMIC | Status: DC
  Administered 2013-11-01 – 2013-11-04 (×6): 1 [drp] via OPHTHALMIC
  Filled 2013-11-01 (×2): qty 5

## 2013-11-01 MED ORDER — OMEGA-3 1000 MG PO CAPS: 1.0000 | ORAL_CAPSULE | Freq: Every day | ORAL | Status: DC

## 2013-11-01 MED ORDER — FUROSEMIDE 40 MG PO TABS
40.0000 mg | ORAL_TABLET | Freq: Two times a day (BID) | ORAL | Status: DC
  Administered 2013-11-01 – 2013-11-03 (×4): 40 mg via ORAL
  Filled 2013-11-01 (×7): qty 1

## 2013-11-01 MED ORDER — METOPROLOL TARTRATE 1 MG/ML IV SOLN: 2.0000 mg | INTRAVENOUS | Status: DC | PRN

## 2013-11-01 MED ORDER — ACETAMINOPHEN 500 MG PO TABS: 500.0000 mg | ORAL_TABLET | Freq: Four times a day (QID) | ORAL | Status: DC | PRN

## 2013-11-01 MED ORDER — URIBEL 118 MG PO CAPS
118.0000 mg | ORAL_CAPSULE | Freq: Three times a day (TID) | ORAL | Status: DC
  Administered 2013-11-01 – 2013-11-04 (×8): 118 mg via ORAL
  Filled 2013-11-01 (×2): qty 1

## 2013-11-01 NOTE — Progress Notes (Signed)
*  Preliminary Results* Bilateral lower extremity venous duplex completed. Bilateral lower extremities are negative for deep vein thrombosis. There is no evidence of Baker's cyst bilaterally.  Preliminary results discussed with Dr.Lockwood.  11/01/2013  Gertie Fey, RVT, RDCS, RDMS

## 2013-11-01 NOTE — ED Notes (Signed)
Wife reports she is going home to get the pt's eye drops and will be back shortly.

## 2013-11-01 NOTE — Consult Note (Signed)
Consult Note  Patient name: [name] MRN: [id number] DOB: Apr 15, 1941 Sex: male  Consulting Physician:  ER  Reason for Consult:  Chief Complaint  Patient presents with  . Leg Pain    HISTORY OF PRESENT ILLNESS: 72 yo male recently s/p bladder cancer surgery.  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 Madilyn Fireman in 2009.  The patient began having symptoms of cramping and left leg pain beginning this morning.  He feels that his pain has improved since he has been here.  He denies acute numbness, he does have chronic tingling.  He denies sensory loss to the left foot.  The left foot is cooler to the touch.  The patient s s/p p[acemaker placement in 2011.  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  He is a diabetic on insulin.  His hypercholesterolomia is managed with a statin.  He is on chronic anticoagulation.  Past Medical History  Diagnosis Date  . Diabetes mellitus   . COPD (chronic obstructive pulmonary disease)     CPAP  . Renal insufficiency   . Hyperlipemia   . Other and unspecified coagulation defects   . Hypothyroidism   . Hyperkalemia   . 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 Sick sinus syndrome     s/p PPM by JA  . Pancreatitis   . Obesity   . Constipation   . OSA on CPAP     using CPAP although it is hard for him to tolerate  . SOB (shortness of breath)   . CAD (coronary artery disease)     not much  . Anginal pain     at times  . Hypertension     occasional  . Pacemaker   . Depression     recently  . Anxiety   . H/O Legionnaire's disease 2003  . History of blood clots     R groin  . Peripheral neuropathy   . Osteoarthritis     fingers  . GERD (gastroesophageal reflux disease)   . Cancer 2014    bladder cancer  . Anemia     hx of  . Pneumonia 2003    Past Surgical History  Procedure Laterality Date  . Nasal septum surgery  1967  . Pacemaker placement  06/2010      [facility] [employer] RF DR, Model [id number] ( Serial number [id number])  . Thromboembolectomy and four compartment fasciotomy  2009  . Cardiac catheterization    . Cataract extraction Right   . Cholecystectomy  2012  . Insert / replace / remove pacemaker  2011  . Transurethral resection of bladder tumor with gyrus (turbt-gyrus) N/A 10/26/2013    Procedure: TRANSURETHRAL RESECTION OF BLADDER TUMOR WITH GYRUS (TURBT-GYRUS);  Surgeon: Sebastian Ache, MD;  Location: [facility];  Service: Urology;  Laterality: N/A;  . Cystoscopy w/ ureteral stent placement Right 10/26/2013    Procedure: CYSTOSCOPY WITH RETROGRADE PYELOGRAM/URETERAL STENT PLACEMENT;  Surgeon: Sebastian Ache, MD;  Location: [facility];  Service: Urology;  Laterality: Right;    History   Social History  . Marital Status: Married    Spouse Name: N/A    Number of Children: N/A  . 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 Smoking status: Former Smoker --  2.00 packs/day for 50 years    Types: Cigarettes    Quit date: 12/08/2006  . Smokeless tobacco: Never Used  . Alcohol Use: No     Comment: quit 3 years ago  . Drug Use: No  . Sexual Activity: Not on file   Other Topics Concern  . Not on file   Social History Narrative  . No narrative on file    Family History  Problem Relation Age of Onset  . Liver cancer Mother     deceased age 74  . Cancer Mother     liver cancer  . Colon cancer Neg Hx   . Heart attack Father     Allergies as of 11/01/2013 - Review Complete 11/01/2013  Allergen Reaction Noted  . Lactose intolerance (gi)  11/01/2013  . Pioglitazone      No current facility-administered medications on file prior to encounter.   Current Outpatient Prescriptions on File Prior to Encounter  Medication Sig Dispense Refill  . acetaminophen (TYLENOL) 500 MG tablet Take 500 mg by mouth every 6 (six) hours as needed for pain.      Marland Kitchen albuterol  (PROVENTIL HFA;VENTOLIN HFA) 108 (90 BASE) MCG/ACT inhaler Inhale 2 puffs into the lungs every 6 (six) hours as needed for wheezing.      Marland Kitchen ALPRAZolam (XANAX) 0.25 MG tablet Take 1 tablet (0.25 mg total) by mouth 3 (three) times daily as needed for anxiety or sleep.  50 tablet  0  . Ascorbic Acid (VITAMIN C) 500 MG tablet Take 500 mg by mouth daily.        Marland Kitchen aspirin 325 MG tablet Take 325 mg by mouth daily.      . brimonidine (ALPHAGAN P) 0.1 % SOLN Place 1 drop into the left eye 3 (three) times daily.       . cholecalciferol (VITAMIN D) 1000 UNITS tablet Take 1,000 Units by mouth daily.      . dorzolamide-timolol (COSOPT) 22.3-6.8 MG/ML ophthalmic solution Place 1 drop into both eyes 2 (two) times daily.       . fluticasone (FLONASE) 50 MCG/ACT nasal spray Place 2 sprays into the nose daily.       Marland Kitchen guaiFENesin (MUCINEX) 600 MG 12 hr tablet Take 600 mg by mouth 2 (two) times daily as needed for to loosen phlegm.      . latanoprost (XALATAN) 0.005 % ophthalmic solution Place 1 drop into both eyes at bedtime.       Marland Kitchen levothyroxine (SYNTHROID, LEVOTHROID) 50 MCG tablet Take 50 mcg by mouth daily before breakfast.      . Multiple Vitamin (MULTIVITAMIN WITH MINERALS) TABS tablet Take 1 tablet by mouth daily.      . OMEGA-3 1000 MG CAPS Take 1 capsule by mouth daily.       Marland Kitchen omeprazole (PRILOSEC) 20 MG capsule Take 1 capsule (20 mg total) by mouth daily.  30 capsule  4  . oxyCODONE-acetaminophen (ROXICET) 5-325 MG per tablet Take 1 tablet by mouth every 4 (four) hours as needed for moderate pain. Post-operatively  30 tablet  0  . Pancrelipase, Lip-Prot-Amyl, 25000 UNITS CPEP Take 25,000 Units by mouth 3 (three) times daily.       . Probiotic Product (PROBIOTIC ACIDOPHILUS) CAPS Take 1 capsule by mouth daily.       . rosuvastatin (CRESTOR) 40 MG tablet Take 20 mg by mouth every evening.      . senna-docusate (SENOKOT-S) 8.6-50 MG per tablet Take 1 tablet by mouth 2 (  two) times daily. While taking pain  meds to prevent constipation  30 tablet  0  . sulfamethoxazole-trimethoprim (BACTRIM DS) 800-160 MG per tablet Take 1 tablet by mouth 2 (two) times daily. X 3 days. Begin day prior to next Urology appointment for catheter removal.  6 tablet  0  . venlafaxine XR (EFFEXOR-XR) 150 MG 24 hr capsule Take 1 capsule (150 mg total) by mouth daily with breakfast.  30 capsule  11     REVIEW OF SYSTEMS:  See HPI and ED note from today.  O/w all systems negative PHYSICAL EXAMINATION: General: The patient appears their stated age.  Vital signs are BP 132/60  Pulse 59  Resp 22  Wt 295 lb (133.811 kg)  SpO2 99% Pulmonary: Respirations are non-labored HEENT:  No gross abnormalities Abdomen: Soft and non-tender  Musculoskeletal: There are no major deformities.   Neurologic: No focal weakness or paresthesias are detected, Skin: There are no ulcer or rashes noted. Psychiatric: The patient has normal affect. Cardiovascular: There is a regular rate and rhythm.  Left leg is much cooler to the touch.  Motor and sensory function are intact to both lower extremities  Diagnostic Studies: I was present for the arterial duplex.  He has calcified tibial vessels and so ABI's are unreliable.  Waveforms on the left were dampened and monophasic.  Artery was patent from hip to ankle.  No thrombus seen.  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  I wanted to get  A CTA to better evaluate this, but his Cr is 2.2.  Therefore, I will get an angiogram to help determine the etiology of his decreased circulation.  Since he has intact motor and sensory function of this left leg, I will admit him over night and place him on a Heparin drip with plans for angio in the morning     V. Charlena Cross, M.D. [facility] Office: [phone] Pager:   [phone]

## 2013-11-01 NOTE — ED Notes (Signed)
MD at Bedside.

## 2013-11-01 NOTE — Progress Notes (Signed)
VASCULAR LAB PRELIMINARY  ARTERIAL  ABI completed:    RIGHT    LEFT    PRESSURE WAVEFORM  PRESSURE WAVEFORM  BRACHIAL 137 Triphasic BRACHIAL 142 Triphasic  AT 201 Biphasic AT  Absent  PT 205 Bilphasic PT >313 Severely dampened monophasic    RIGHT LEFT  ABI 1.44 N/A calcified   ABI on the right indicates mild calcification. Left ABI could not be ascertained due to incompressible vessel probably secondary to calcification.  Duplex scan of the left lower extremity reveal moderate to severe calcific plaque throughout with diminished velocities. Unable to visualize the iliac artery. Doppler waveforms were abnormal throughout the extremity.  Alexarae Oliva, RVS  11/01/2013, 7:11 PM

## 2013-11-01 NOTE — ED Provider Notes (Signed)
CSN: [id number]     Arrival date & time 11/01/13  1323 History   First MD Initiated Contact with Patient 11/01/13 1504     Chief Complaint  Patient presents with  . Leg Pain   (Consider location/radiation/quality/duration/timing/severity/associated sxs/prior Treatment) HPI [name] is a 72 y.o. male who presents to emergency department complaining of pain in his left leg. Patient states that his pain began this morning when he was walking in the house. States he did not twist it or falling on it. States pain radiated from his hip into his foot. Patient denies any new numbness or weakness in the foot. States he always has a mild tingling in the leg. States that he did have a bladder surgery a week ago. States he had a tumor resected and stent placed. Patient also has been off of his Coumadin and then restarted it 2 days ago. Patient has history of DVTs and was told by his doctor to come here.  Past Medical History  Diagnosis Date  . Diabetes mellitus   . COPD (chronic obstructive pulmonary disease)     CPAP  . Renal insufficiency   . Hyperlipemia   . Other and unspecified coagulation defects   . Hypothyroidism   . Hyperkalemia   . 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 Sick sinus syndrome     s/p PPM by JA  . Pancreatitis   . Obesity   . Constipation   . OSA on CPAP     using CPAP although it is hard for him to tolerate  . SOB (shortness of breath)   . CAD (coronary artery disease)     not much  . Anginal pain     at times  . Hypertension     occasional  . Pacemaker   . Depression     recently  . Anxiety   . H/O Legionnaire's disease 2003  . History of blood clots     R groin  . Peripheral neuropathy   . Osteoarthritis     fingers  . GERD (gastroesophageal reflux disease)   . Cancer 2014    bladder cancer  . Anemia     hx of  . Pneumonia 2003   Past Surgical History  Procedure Laterality Date  . Nasal septum surgery  1967  . Pacemaker placement   06/2010    [facility] [employer] RF DR, Model [id number] ( Serial number [id number])  . Thromboembolectomy and four compartment fasciotomy  2009  . Cardiac catheterization    . Cataract extraction Right   . Cholecystectomy  2012  . Insert / replace / remove pacemaker  2011  . Transurethral resection of bladder tumor with gyrus (turbt-gyrus) N/A 10/26/2013    Procedure: TRANSURETHRAL RESECTION OF BLADDER TUMOR WITH GYRUS (TURBT-GYRUS);  Surgeon: Sebastian Ache, MD;  Location: [facility];  Service: Urology;  Laterality: N/A;  . Cystoscopy w/ ureteral stent placement Right 10/26/2013    Procedure: CYSTOSCOPY WITH RETROGRADE PYELOGRAM/URETERAL STENT PLACEMENT;  Surgeon: Sebastian Ache, MD;  Location: [facility];  Service: Urology;  Laterality: Right;   Family History  Problem Relation Age of Onset  . Liver cancer Mother     deceased age 42  . Cancer Mother     liver cancer  . Colon cancer Neg Hx   . Heart attack Father    History  Substance Use Topics  . Smoking status: Former Smoker -- 2.00 packs/day for 50 years  Types: Cigarettes    Quit date: 12/08/2006  . Smokeless tobacco: Never Used  . Alcohol Use: No     Comment: quit 3 years ago    Review of Systems  Constitutional: Negative for fever and chills.  Respiratory: Negative for cough, chest tightness and shortness of breath.   Cardiovascular: Negative for chest pain, palpitations and leg swelling.  Gastrointestinal: Negative for nausea, vomiting, abdominal pain, diarrhea and abdominal distention.  Genitourinary: Negative for dysuria, urgency, frequency and hematuria.  Musculoskeletal: Negative for arthralgias, myalgias, neck pain and neck stiffness.  Skin: Negative for rash.  Allergic/Immunologic: Negative for immunocompromised state.  Neurological: Negative for dizziness, weakness, light-headedness, numbness and headaches.    Allergies  Pioglitazone  Home Medications   Current Outpatient Rx  Name  Route  Sig  Dispense  Refill   . acetaminophen (TYLENOL) 500 MG tablet   Oral   Take 500 mg by mouth every 6 (six) hours as needed for pain.         Marland Kitchen albuterol (PROVENTIL HFA;VENTOLIN HFA) 108 (90 BASE) MCG/ACT inhaler   Inhalation   Inhale 2 puffs into the lungs every 6 (six) hours as needed for wheezing.         Marland Kitchen ALPRAZolam (XANAX) 0.25 MG tablet   Oral   Take 1 tablet (0.25 mg total) by mouth 3 (three) times daily as needed for anxiety or sleep.   50 tablet   0   . Ascorbic Acid (VITAMIN C) 500 MG tablet   Oral   Take 500 mg by mouth daily.           Marland Kitchen aspirin 325 MG tablet   Oral   Take 325 mg by mouth daily.         . brimonidine (ALPHAGAN P) 0.1 % SOLN   Left Eye   Place 1 drop into the left eye 3 (three) times daily.          . cholecalciferol (VITAMIN D) 1000 UNITS tablet   Oral   Take 1,000 Units by mouth daily.         . dorzolamide-timolol (COSOPT) 22.3-6.8 MG/ML ophthalmic solution   Both Eyes   Place 1 drop into both eyes 2 (two) times daily.          . fluticasone (FLONASE) 50 MCG/ACT nasal spray   Nasal   Place 2 sprays into the nose daily.          . furosemide (LASIX) 40 MG tablet      Take 1 tablet twice daily for 3 days, then take 1 tablet once daily till seen by PCP.   30 tablet   0   . guaiFENesin (MUCINEX) 600 MG 12 hr tablet   Oral   Take 600 mg by mouth 2 (two) times daily as needed for to loosen phlegm.         . insulin regular human CONCENTRATED (HUMULIN R) 500 UNIT/ML SOLN injection      300 units with breakfast, 300 units with lunch 300 units with supper, 125 units with bedtime snack   3 vial   11   . latanoprost (XALATAN) 0.005 % ophthalmic solution   Both Eyes   Place 1 drop into both eyes at bedtime.          Marland Kitchen levothyroxine (SYNTHROID, LEVOTHROID) 50 MCG tablet   Oral   Take 50 mcg by mouth daily before breakfast.         . Multiple Vitamin (MULTIVITAMIN WITH  MINERALS) TABS tablet   Oral   Take 1 tablet by mouth daily.          . OMEGA-3 1000 MG CAPS   Oral   Take 1 capsule by mouth daily.          Marland Kitchen omeprazole (PRILOSEC) 20 MG capsule   Oral   Take 1 capsule (20 mg total) by mouth daily.   30 capsule   4   . oxyCODONE-acetaminophen (ROXICET) 5-325 MG per tablet   Oral   Take 1 tablet by mouth every 4 (four) hours as needed for moderate pain. Post-operatively   30 tablet   0   . Pancrelipase, Lip-Prot-Amyl, 25000 UNITS CPEP   Oral   Take 25,000 Units by mouth 3 (three) times daily.          . Probiotic Product (PROBIOTIC ACIDOPHILUS) CAPS   Oral   Take 1 capsule by mouth daily.          . rosuvastatin (CRESTOR) 40 MG tablet   Oral   Take 20 mg by mouth every evening.         . senna-docusate (SENOKOT-S) 8.6-50 MG per tablet   Oral   Take 1 tablet by mouth 2 (two) times daily. While taking pain meds to prevent constipation   30 tablet   0   . sulfamethoxazole-trimethoprim (BACTRIM DS) 800-160 MG per tablet   Oral   Take 1 tablet by mouth 2 (two) times daily. X 3 days. Begin day prior to next Urology appointment for catheter removal.   6 tablet   0   . venlafaxine XR (EFFEXOR-XR) 150 MG 24 hr capsule   Oral   Take 1 capsule (150 mg total) by mouth daily with breakfast.   30 capsule   11   . warfarin (COUMADIN) 2 MG tablet      Take 4 mg every Wednesday. Take 6 mg every Sunday, Monday, Tuesday, Thursday, Friday, Saturday. May resume tonight.          BP 105/48  Pulse 64  Resp 22  Wt 295 lb (133.811 kg)  SpO2 96% Physical Exam  Nursing note and vitals reviewed. Constitutional: He appears well-developed and well-nourished. No distress.  HENT:  Head: Normocephalic and atraumatic.  Eyes: Conjunctivae are normal.  Neck: Neck supple.  Cardiovascular: Normal rate, regular rhythm and normal heart sounds.   Pulmonary/Chest: Effort normal. No respiratory distress. He has no wheezes. He has no rales.  Abdominal: Soft. Bowel sounds are normal. He exhibits no distension. There  is no tenderness. There is no rebound.  Musculoskeletal:  Trace edema in lower extremities bilaterally. Hyperpigmentation of bilateral lower legs from mid shin down to his ankles. Left foot is cold to the touch, pink, cap refill 3-4 seconds. Doppler used to palpate left posterior tibial pulse. Unable to get a dorsal pedal pulse. No pain with palpation of the left hip, knee, ankle. No pain with range of motion in any of the joints. Bilateral femoral pulses present.  Neurological: He is alert.  Skin: Skin is warm and dry.    ED Course  Procedures (including critical care time) Labs Review Labs Reviewed  POCT I-STAT, CHEM 8 - Abnormal; Notable for the following:    BUN 42 (*)    Creatinine, Ser 2.20 (*)    Glucose, Bld 264 (*)    All other components within normal limits  CBC  PROTIME-INR  HEMOGLOBIN A1C   Imaging Review No results found.  EKG Interpretation  None       MDM   1. Edema    Patient with left leg pain. On exam left leg is cold poor cap refill. He has been off of his Coumadin for several days D2 a bladder procedure last week. His Coumadin level is subtherapeutic. Concerning for possible arterial clot. He did have a venous duplex done prior to me seeing him here today. It is negative for a DVT.  I discussed patient with vascular surgery, Dr. Myra Gianotti who recommended getting ABI and arterial studies of the legs. Patient states his pain has actually improved since she's been here.   7:33 PM Patient seen by Dr. Myra Gianotti, will admit for further evaluation in the hospital.      Lottie Mussel, PA-C 11/01/13 1934

## 2013-11-01 NOTE — Progress Notes (Signed)
ANTICOAGULATION CONSULT NOTE - Initial Consult  Pharmacy Consult for heparin Indication: ischemic leg  Allergies  Allergen Reactions  . Lactose Intolerance (Gi)     Gi upset  . Pioglitazone     ACTOS  REACTION: Edema    Patient Measurements: Weight: 295 lb (133.811 kg); height= 70 inches Heparin Dosing Weight: 104 kg  Vital Signs: BP: 116/51 mmHg (11/25 1900) Pulse Rate: 64 (11/25 1900)  Labs:  Recent Labs  10/30/13 0532 11/01/13 1544 11/01/13 1643  HGB  --  13.7 16.0  HCT  --  41.2 47.0  PLT  --  228  --   LABPROT  --  13.2  --   INR  --  1.02  --   CREATININE 1.46*  --  2.20*    The CrCl is unknown because both a height and weight (above a minimum accepted value) are required for this calculation.   Medical History: Past Medical History  Diagnosis Date  . Diabetes mellitus   . COPD (chronic obstructive pulmonary disease)     CPAP  . Renal insufficiency   . Hyperlipemia   . Other and unspecified coagulation defects   . Hypothyroidism   . Hyperkalemia   . 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 Sick sinus syndrome     s/p PPM by JA  . Pancreatitis   . Obesity   . Constipation   . OSA on CPAP     using CPAP although it is hard for him to tolerate  . SOB (shortness of breath)   . CAD (coronary artery disease)     not much  . Anginal pain     at times  . Hypertension     occasional  . Pacemaker   . Depression     recently  . Anxiety   . H/O Legionnaire's disease 2003  . History of blood clots     R groin  . Peripheral neuropathy   . Osteoarthritis     fingers  . GERD (gastroesophageal reflux disease)   . Cancer 2014    bladder cancer  . Anemia     hx of  . Pneumonia 2003    Medications:  See med rec  Assessment: Patient is a 72 y.o M on coumadin PTA for hx arterial thrombosis in his right leg.  He presented to the ED today with ischemic left leg.  INR is subtherapeutic at 1.02.  Patient also has bladder cancer s/p  resection of tumor with ureteral stent placement on 10/26/13.  Goal of Therapy:  Heparin level 0.3-0.7 units/ml Monitor platelets by anticoagulation protocol: Yes   Plan:  1) Since patient had bladder surgery about a week ago, Dr. Myra Gianotti requested no bolus for heparin.  Will start heparin drip at 1400 units/hr 2) check 8 hour heparin level  Tyiesha Brackney P 11/01/2013,8:06 PM

## 2013-11-01 NOTE — ED Notes (Signed)
Pt currently in [place] for doppler.

## 2013-11-01 NOTE — ED Notes (Signed)
Pt was discharged from [facility] for bladder surgery on Sunday. States this am he began to have pain in his LLE from hip to foot. States it feels like when he had a DVT many years ago.

## 2013-11-02 ENCOUNTER — Encounter (HOSPITAL_COMMUNITY)
Admission: EM | Disposition: A | Discharge status: Home / Self Care | Payer: Self-pay | Source: Home / Self Care | Attending: Vascular Surgery

## 2013-11-02 ENCOUNTER — Encounter (HOSPITAL_COMMUNITY): Payer: Medicare Other | Admitting: Anesthesiology

## 2013-11-02 ENCOUNTER — Observation Stay (HOSPITAL_COMMUNITY): Payer: Medicare Other | Admitting: Anesthesiology

## 2013-11-02 ENCOUNTER — Encounter (HOSPITAL_COMMUNITY): Payer: Self-pay | Admitting: Anesthesiology

## 2013-11-02 DIAGNOSIS — I70202 Unspecified atherosclerosis of native arteries of extremities, left leg: Secondary | ICD-10-CM | POA: Diagnosis present

## 2013-11-02 DIAGNOSIS — I70219 Atherosclerosis of native arteries of extremities with intermittent claudication, unspecified extremity: Secondary | ICD-10-CM

## 2013-11-02 HISTORY — PX: EMBOLECTOMY: SHX44

## 2013-11-02 LAB — COMPREHENSIVE METABOLIC PANEL
AST: 42 U/L — ABNORMAL HIGH (ref 0–37)
Albumin: 2.9 g/dL — ABNORMAL LOW (ref 3.5–5.2)
Alkaline Phosphatase: 130 U/L — ABNORMAL HIGH (ref 39–117)
Calcium: 9.1 mg/dL (ref 8.4–10.5)
Creatinine, Ser: 2.03 mg/dL — ABNORMAL HIGH (ref 0.50–1.35)
Sodium: 132 mEq/L — ABNORMAL LOW (ref 135–145)

## 2013-11-02 LAB — GLUCOSE, CAPILLARY: Glucose-Capillary: 258 mg/dL — ABNORMAL HIGH (ref 70–99)

## 2013-11-02 LAB — CBC
HCT: 42.1 % (ref 39.0–52.0)
MCH: 32.5 pg (ref 26.0–34.0)
MCH: 32 pg (ref 26.0–34.0)
MCHC: 33.5 g/dL (ref 30.0–36.0)
MCV: 94.7 fL (ref 78.0–100.0)
MCV: 95.5 fL (ref 78.0–100.0)
Platelets: 224 10*3/uL (ref 150–400)
Platelets: 212 10*3/uL (ref 150–400)
RDW: 14.7 % (ref 11.5–15.5)
RDW: 14.5 % (ref 11.5–15.5)
WBC: 9.9 10*3/uL (ref 4.0–10.5)

## 2013-11-02 LAB — POCT ACTIVATED CLOTTING TIME: Activated Clotting Time: 166 seconds

## 2013-11-02 LAB — HEMOGLOBIN A1C
Hgb A1c MFr Bld: 7.9 % — ABNORMAL HIGH (ref <5.7)
Mean Plasma Glucose: 180 mg/dL — ABNORMAL HIGH (ref <117)

## 2013-11-02 LAB — SURGICAL PCR SCREEN: Staphylococcus aureus: POSITIVE — AB

## 2013-11-02 LAB — PROTIME-INR: INR: 1.17 (ref 0.00–1.49)

## 2013-11-02 SURGERY — ABDOMINAL ANGIOGRAM

## 2013-11-02 SURGERY — EMBOLECTOMY
Anesthesia: General | Site: Groin | Laterality: Left | Wound class: Clean

## 2013-11-02 MED ORDER — MORPHINE SULFATE 2 MG/ML IJ SOLN
2.0000 mg | INTRAMUSCULAR | Status: DC | PRN
  Administered 2013-11-02 – 2013-11-03 (×3): 4 mg via INTRAVENOUS
  Filled 2013-11-02 (×2): qty 2

## 2013-11-02 MED ORDER — OXYCODONE HCL 5 MG PO TABS: 5.0000 mg | ORAL_TABLET | ORAL | Status: DC | PRN

## 2013-11-02 MED ORDER — CEFAZOLIN SODIUM-DEXTROSE 2-3 GM-% IV SOLR
2.0000 g | INTRAVENOUS | Status: AC
  Administered 2013-11-02: 2 g via INTRAVENOUS
  Filled 2013-11-02: qty 50

## 2013-11-02 MED ORDER — GLYCOPYRROLATE 0.2 MG/ML IJ SOLN
INTRAMUSCULAR | Status: DC | PRN
  Administered 2013-11-02: .8 mg via INTRAVENOUS

## 2013-11-02 MED ORDER — FENTANYL CITRATE 0.05 MG/ML IJ SOLN
INTRAMUSCULAR | Status: AC
  Filled 2013-11-02: qty 2

## 2013-11-02 MED ORDER — ASPIRIN 81 MG PO CHEW
CHEWABLE_TABLET | ORAL | Status: AC
  Administered 2013-11-02: 10:00:00
  Filled 2013-11-02: qty 4

## 2013-11-02 MED ORDER — INSULIN REGULAR HUMAN (CONC) 500 UNIT/ML ~~LOC~~ SOLN
150.0000 [IU] | Freq: Every day | SUBCUTANEOUS | Status: DC
  Filled 2013-11-02 (×2): qty 20

## 2013-11-02 MED ORDER — MORPHINE SULFATE 4 MG/ML IJ SOLN
INTRAMUSCULAR | Status: AC
  Administered 2013-11-02: 4 mg
  Filled 2013-11-02: qty 1

## 2013-11-02 MED ORDER — HYDRALAZINE HCL 20 MG/ML IJ SOLN: 10.0000 mg | INTRAMUSCULAR | Status: DC | PRN

## 2013-11-02 MED ORDER — SUCCINYLCHOLINE CHLORIDE 20 MG/ML IJ SOLN
INTRAMUSCULAR | Status: DC | PRN
  Administered 2013-11-02: 120 mg via INTRAVENOUS

## 2013-11-02 MED ORDER — INSULIN ASPART 100 UNIT/ML ~~LOC~~ SOLN
SUBCUTANEOUS | Status: DC | PRN
  Administered 2013-11-02: 9 [IU] via SUBCUTANEOUS

## 2013-11-02 MED ORDER — MIDAZOLAM HCL 5 MG/5ML IJ SOLN
INTRAMUSCULAR | Status: DC | PRN
  Administered 2013-11-02 (×2): 1 mg via INTRAVENOUS

## 2013-11-02 MED ORDER — PROTAMINE SULFATE 10 MG/ML IV SOLN
INTRAVENOUS | Status: DC | PRN
  Administered 2013-11-02 (×5): 10 mg via INTRAVENOUS

## 2013-11-02 MED ORDER — FENTANYL CITRATE 0.05 MG/ML IJ SOLN
INTRAMUSCULAR | Status: DC | PRN
  Administered 2013-11-02: 50 ug via INTRAVENOUS
  Administered 2013-11-02: 150 ug via INTRAVENOUS

## 2013-11-02 MED ORDER — ONDANSETRON HCL 4 MG/2ML IJ SOLN: 4.0000 mg | Freq: Four times a day (QID) | INTRAMUSCULAR | Status: DC | PRN

## 2013-11-02 MED ORDER — ALUM & MAG HYDROXIDE-SIMETH 200-200-20 MG/5ML PO SUSP: 15.0000 mL | ORAL | Status: DC | PRN

## 2013-11-02 MED ORDER — ALBUTEROL SULFATE HFA 108 (90 BASE) MCG/ACT IN AERS
INHALATION_SPRAY | RESPIRATORY_TRACT | Status: DC | PRN
  Administered 2013-11-02: 6 via RESPIRATORY_TRACT
  Administered 2013-11-02: 4 via RESPIRATORY_TRACT

## 2013-11-02 MED ORDER — MIDAZOLAM HCL 2 MG/2ML IJ SOLN
INTRAMUSCULAR | Status: AC
  Filled 2013-11-02: qty 2

## 2013-11-02 MED ORDER — ALBUTEROL SULFATE HFA 108 (90 BASE) MCG/ACT IN AERS: 2.0000 | INHALATION_SPRAY | Freq: Four times a day (QID) | RESPIRATORY_TRACT | Status: DC | PRN

## 2013-11-02 MED ORDER — LABETALOL HCL 5 MG/ML IV SOLN
10.0000 mg | INTRAVENOUS | Status: DC | PRN
  Filled 2013-11-02: qty 4

## 2013-11-02 MED ORDER — DEXTROSE 5 % IV SOLN
INTRAVENOUS | Status: DC | PRN
  Administered 2013-11-02: 14:00:00 via INTRAVENOUS

## 2013-11-02 MED ORDER — SODIUM CHLORIDE 0.9 % IV SOLN
INTRAVENOUS | Status: DC
  Administered 2013-11-02: 08:00:00 via INTRAVENOUS

## 2013-11-02 MED ORDER — LIDOCAINE HCL (PF) 1 % IJ SOLN
INTRAMUSCULAR | Status: AC
  Filled 2013-11-02: qty 30

## 2013-11-02 MED ORDER — HEPARIN (PORCINE) IN NACL 100-0.45 UNIT/ML-% IJ SOLN
800.0000 [IU]/h | INTRAMUSCULAR | Status: DC
  Administered 2013-11-02 – 2013-11-03 (×2): 800 [IU]/h via INTRAVENOUS
  Filled 2013-11-02 (×3): qty 250

## 2013-11-02 MED ORDER — PROPOFOL 10 MG/ML IV BOLUS
INTRAVENOUS | Status: DC | PRN
  Administered 2013-11-02: 170 mg via INTRAVENOUS

## 2013-11-02 MED ORDER — SODIUM CHLORIDE 0.9 % IV SOLN: 500.0000 mL | Freq: Once | INTRAVENOUS | Status: AC | PRN

## 2013-11-02 MED ORDER — ONDANSETRON HCL 4 MG/2ML IJ SOLN
INTRAMUSCULAR | Status: DC | PRN
  Administered 2013-11-02: 4 mg via INTRAVENOUS

## 2013-11-02 MED ORDER — HEPARIN (PORCINE) IN NACL 2-0.9 UNIT/ML-% IJ SOLN
INTRAMUSCULAR | Status: AC
  Filled 2013-11-02: qty 1000

## 2013-11-02 MED ORDER — NEOSTIGMINE METHYLSULFATE 1 MG/ML IJ SOLN
INTRAMUSCULAR | Status: DC | PRN
  Administered 2013-11-02: 5 mg via INTRAVENOUS

## 2013-11-02 MED ORDER — POTASSIUM CHLORIDE CRYS ER 20 MEQ PO TBCR: 20.0000 meq | EXTENDED_RELEASE_TABLET | Freq: Once | ORAL | Status: AC | PRN

## 2013-11-02 MED ORDER — METOPROLOL TARTRATE 1 MG/ML IV SOLN: 2.0000 mg | INTRAVENOUS | Status: DC | PRN

## 2013-11-02 MED ORDER — SODIUM CHLORIDE 0.9 % IR SOLN
Status: DC | PRN
  Administered 2013-11-02: 13:00:00

## 2013-11-02 MED ORDER — INSULIN REGULAR HUMAN (CONC) 500 UNIT/ML ~~LOC~~ SOLN
250.0000 [IU] | Freq: Three times a day (TID) | SUBCUTANEOUS | Status: DC
  Administered 2013-11-03 (×2): 250 [IU] via SUBCUTANEOUS
  Filled 2013-11-02 (×10): qty 20

## 2013-11-02 MED ORDER — DOPAMINE-DEXTROSE 3.2-5 MG/ML-% IV SOLN: 3.0000 ug/kg/min | INTRAVENOUS | Status: DC

## 2013-11-02 MED ORDER — DEXTROSE 5 % IV SOLN
1.5000 g | Freq: Two times a day (BID) | INTRAVENOUS | Status: AC
  Administered 2013-11-02 – 2013-11-03 (×2): 1.5 g via INTRAVENOUS
  Filled 2013-11-02 (×2): qty 1.5

## 2013-11-02 MED ORDER — FENTANYL CITRATE 0.05 MG/ML IJ SOLN
25.0000 ug | INTRAMUSCULAR | Status: DC | PRN
  Administered 2013-11-02: 50 ug via INTRAVENOUS

## 2013-11-02 MED ORDER — ROCURONIUM BROMIDE 100 MG/10ML IV SOLN
INTRAVENOUS | Status: DC | PRN
  Administered 2013-11-02: 50 mg via INTRAVENOUS

## 2013-11-02 MED ORDER — 0.9 % SODIUM CHLORIDE (POUR BTL) OPTIME
TOPICAL | Status: DC | PRN
  Administered 2013-11-02: 1000 mL

## 2013-11-02 MED ORDER — HEPARIN SODIUM (PORCINE) 1000 UNIT/ML IJ SOLN
INTRAMUSCULAR | Status: DC | PRN
  Administered 2013-11-02: 7000 [IU] via INTRAVENOUS

## 2013-11-02 MED ORDER — FENTANYL CITRATE 0.05 MG/ML IJ SOLN
INTRAMUSCULAR | Status: AC
  Administered 2013-11-02: 50 ug via INTRAVENOUS
  Filled 2013-11-02: qty 2

## 2013-11-02 MED ORDER — PHENOL 1.4 % MT LIQD: 1.0000 | OROMUCOSAL | Status: DC | PRN

## 2013-11-02 MED ORDER — LIDOCAINE HCL (CARDIAC) 20 MG/ML IV SOLN
INTRAVENOUS | Status: DC | PRN
  Administered 2013-11-02: 60 mg via INTRAVENOUS

## 2013-11-02 MED ORDER — SODIUM CHLORIDE 0.9 % IV SOLN
INTRAVENOUS | Status: DC
  Administered 2013-11-02: 19:00:00 via INTRAVENOUS

## 2013-11-02 SURGICAL SUPPLY — 61 items
BANDAGE ESMARK 6X9 LF (GAUZE/BANDAGES/DRESSINGS) IMPLANT
BLADE SURG ROTATE 9660 (MISCELLANEOUS) ×2 IMPLANT
BNDG ESMARK 6X9 LF (GAUZE/BANDAGES/DRESSINGS)
CANISTER SUCTION 2500CC (MISCELLANEOUS) ×2 IMPLANT
CATH EMB 3FR 80CM (CATHETERS) ×2 IMPLANT
CATH EMB 4FR 80CM (CATHETERS) ×2 IMPLANT
CATH EMB 5FR 80CM (CATHETERS) ×2 IMPLANT
CLIP TI MEDIUM 24 (CLIP) ×2 IMPLANT
CLIP TI WIDE RED SMALL 24 (CLIP) ×2 IMPLANT
CONT SPEC 4OZ CLIKSEAL STRL BL (MISCELLANEOUS) ×2 IMPLANT
COVER SURGICAL LIGHT HANDLE (MISCELLANEOUS) ×2 IMPLANT
CUFF TOURNIQUET SINGLE 18IN (TOURNIQUET CUFF) IMPLANT
CUFF TOURNIQUET SINGLE 24IN (TOURNIQUET CUFF) IMPLANT
CUFF TOURNIQUET SINGLE 34IN LL (TOURNIQUET CUFF) IMPLANT
CUFF TOURNIQUET SINGLE 44IN (TOURNIQUET CUFF) IMPLANT
DERMABOND ADVANCED (GAUZE/BANDAGES/DRESSINGS) ×1
DERMABOND ADVANCED .7 DNX12 (GAUZE/BANDAGES/DRESSINGS) ×1 IMPLANT
DRAIN SNY 10X20 3/4 PERF (WOUND CARE) IMPLANT
DRAPE WARM FLUID 44X44 (DRAPE) ×2 IMPLANT
DRAPE X-RAY CASS 24X20 (DRAPES) IMPLANT
ELECT REM PT RETURN 9FT ADLT (ELECTROSURGICAL) ×2
ELECTRODE REM PT RTRN 9FT ADLT (ELECTROSURGICAL) ×1 IMPLANT
EVACUATOR SILICONE 100CC (DRAIN) IMPLANT
GLOVE BIO SURGEON STRL SZ 6.5 (GLOVE) ×6 IMPLANT
GLOVE BIO SURGEON STRL SZ7.5 (GLOVE) ×4 IMPLANT
GLOVE BIOGEL PI IND STRL 6.5 (GLOVE) ×1 IMPLANT
GLOVE BIOGEL PI IND STRL 7.0 (GLOVE) ×1 IMPLANT
GLOVE BIOGEL PI IND STRL 7.5 (GLOVE) ×2 IMPLANT
GLOVE BIOGEL PI INDICATOR 6.5 (GLOVE) ×1
GLOVE BIOGEL PI INDICATOR 7.0 (GLOVE) ×1
GLOVE BIOGEL PI INDICATOR 7.5 (GLOVE) ×2
GLOVE SS BIOGEL STRL SZ 7 (GLOVE) ×1 IMPLANT
GLOVE SUPERSENSE BIOGEL SZ 7 (GLOVE) ×1
GLOVE SURG SS PI 6.0 STRL IVOR (GLOVE) ×2 IMPLANT
GOWN STRL NON-REIN LRG LVL3 (GOWN DISPOSABLE) ×8 IMPLANT
KIT BASIN OR (CUSTOM PROCEDURE TRAY) ×2 IMPLANT
KIT ROOM TURNOVER OR (KITS) ×2 IMPLANT
NS IRRIG 1000ML POUR BTL (IV SOLUTION) ×4 IMPLANT
PACK PERIPHERAL VASCULAR (CUSTOM PROCEDURE TRAY) ×2 IMPLANT
PAD ARMBOARD 7.5X6 YLW CONV (MISCELLANEOUS) ×4 IMPLANT
PADDING CAST COTTON 6X4 STRL (CAST SUPPLIES) IMPLANT
PATCH HEMASHIELD 8X75 (Vascular Products) ×2 IMPLANT
SET COLLECT BLD 21X3/4 12 (NEEDLE) IMPLANT
STOPCOCK 4 WAY LG BORE MALE ST (IV SETS) IMPLANT
SUT PROLENE 5 0 C 1 24 (SUTURE) ×2 IMPLANT
SUT PROLENE 6 0 BV (SUTURE) ×6 IMPLANT
SUT PROLENE 6 0 C 1 24 (SUTURE) ×2 IMPLANT
SUT PROLENE 6 0 CC (SUTURE) ×2 IMPLANT
SUT VIC AB 2-0 CTX 36 (SUTURE) ×2 IMPLANT
SUT VIC AB 3-0 SH 27 (SUTURE) ×1
SUT VIC AB 3-0 SH 27X BRD (SUTURE) ×1 IMPLANT
SUT VIC AB 3-0 SH 8-18 (SUTURE) ×2 IMPLANT
SUT VICRYL 4-0 PS2 18IN ABS (SUTURE) ×2 IMPLANT
SYR 3ML LL SCALE MARK (SYRINGE) ×2 IMPLANT
SYR TB 1ML LUER SLIP (SYRINGE) ×2 IMPLANT
TOWEL OR 17X24 6PK STRL BLUE (TOWEL DISPOSABLE) ×2 IMPLANT
TOWEL OR 17X26 10 PK STRL BLUE (TOWEL DISPOSABLE) ×4 IMPLANT
TRAY FOLEY CATH 16FRSI W/METER (SET/KITS/TRAYS/PACK) ×2 IMPLANT
TUBING EXTENTION W/L.L. (IV SETS) IMPLANT
UNDERPAD 30X30 INCONTINENT (UNDERPADS AND DIAPERS) ×2 IMPLANT
WATER STERILE IRR 1000ML POUR (IV SOLUTION) ×2 IMPLANT

## 2013-11-02 NOTE — Care Management Note (Unsigned)
    Page 1 of 1   11/02/2013     1:24:46 PM   CARE MANAGEMENT NOTE 11/02/2013  Patient:  [name], [name]   Account Number:  [id number]  Date Initiated:  11/02/2013  Documentation initiated by:  Triva Hueber  Subjective/Objective Assessment:   PT ADM ON 11/01/13 WITH LT LOWER LEG PAIN AND COLDNESS. PTA, PT INDEPENDENT, LIVES WITH SPOUSE.     Action/Plan:   WILL FOLLOW FOR DISCHARGE NEEDS AS PT PROGRESSES.   Anticipated DC Date:  11/04/2013   Anticipated DC Plan:  HOME W HOME HEALTH SERVICES      DC Planning Services  CM consult      Choice offered to / List presented to:             Status of service:  In process, will continue to follow Medicare Important Message given?   (If response is "NO", the following Medicare IM given date fields will be blank) Date Medicare IM given:   Date Additional Medicare IM given:    Discharge Disposition:    Per UR Regulation:  Reviewed for med. necessity/level of care/duration of stay  If discussed at Long Length of Stay Meetings, dates discussed:    Comments:

## 2013-11-02 NOTE — Progress Notes (Signed)
Pt given 4 81 mg ASA at this time; Hep gtt d/c at this time pre cath.

## 2013-11-02 NOTE — Op Note (Signed)
    Patient name: [name] MRN: [id number] DOB: 1941-09-15 Sex: male  11/01/2013 - 11/02/2013 Pre-operative Diagnosis: Left leg ischemia Post-operative diagnosis:  Same Surgeon:  Jorge Ny Procedure Performed:  1.  ultrasound-guided access, right femoral artery  2.  abdominal aortogram with CO2  3.  first order catheterization (left common iliac artery  4.  limited left lower extremity angiogram   Indications:  The patient presented with pain in his left leg yesterday.  Ultrasound did not identify any thrombus within the superficial femoral-popliteal or tibial vessels.  The profunda was also widely patent.  There did appear to be an obstructed signal in the common femoral artery and the waveforms were severely dampened at the ankle.  This suggested that there was iliac stenosis vs. occlusion vs. clot.  I felt this needed to be better image prior to taking it to the operating room.  I want to get a CT angiogram, however his creatinine was too elevated for this, therefore I kept him overnight on heparin as she did not have a frankly ischemic leg with plans for arteriography today.  Procedure:  The patient was identified in the holding area and taken to room 8.  The patient was then placed supine on the table and prepped and draped in the usual sterile fashion.  A time out was called.  Ultrasound was used to evaluate the right common femoral artery.  It was patent .  A digital ultrasound image was acquired.  A micropuncture needle was used to access the right common femoral artery under ultrasound guidance.  An 018 wire was advanced without resistance and a micropuncture sheath was placed.  The 018 wire was removed and a benson wire was placed.  The micropuncture sheath was exchanged for a 5 french sheath.  An omniflush catheter was advanced over the wire to the level of L-1.  An abdominal angiogram was obtained.  With CO2.  Next using a sauce catheter, a catheter tip was placed in the  left common iliac artery and a total of 7 cc of contrast was utilized to image the iliofemoral section. Findings:   Aortogram:  The suprarenal abdominal aorta is widely patent.  The distal branches are widely patent.  The infrarenal abdominal aorta is widely patent.  The right common external and internal iliac arteries are widely patent.  The left common external and internal iliac arteries are widely patent.  Left Lower Extremity:  The left common femoral artery is nearly occlusive.  There is irregular stenosis throughout the common femoral artery.  There is delayed filling of the profunda and superficial femoral artery.  Intervention:  None  Impression:  #1  common femoral artery stenosis/occlusion with delayed filling of the superficial femoral and profunda femoral artery.  Given the patient's presentation this is most likely consistent with thrombus.  He will be scheduled for operative treatment.    Juleen China, M.D. [facility] Office: [phone] Pager:  [phone]

## 2013-11-02 NOTE — ED Provider Notes (Signed)
Medical screening examination/treatment/procedure(s) were conducted as a shared visit with non-physician practitioner(s) and myself.  I personally evaluated the patient during the encounter. Patient is a 71 year old male presents to the emergency department with complaints of left leg pain that started this morning. He denies any injury or trauma. He denies any chest pain or shortness of breath. He does have a history of diabetes and atrial fibrillation and recently underwent surgery for bladder cancer.  On exam vitals are stable and the patient is afebrile. Heart is irregularly irregular and lungs are clear. Abdomen is benign. The left foot is noted to be cool to the touch and I am unable to palpate a dorsalis pedis or posterior tibial pulse. Capillary refill is diminished in the left foot. His pulses are faintly palpable in the right foot which is warm to the touch.  Patient presents here with symptoms concerning for an ischemic limb. He was sent for vascular studies which revealed patent arteries however very slow flow through them. They were unable to visualize the upper iliac artery due to body habitus. Vascular surgery has been consult with the patient was evaluated by Dr. Myra Gianotti. He is suspicious that there is an occlusion at the level of the iliac artery and wants to admit the patient for further evaluation.     Geoffery Lyons, MD 11/02/13 [phone]

## 2013-11-02 NOTE — Interval H&P Note (Signed)
History and Physical Interval Note:  11/02/2013 12:29 PM  [name]  has presented today for surgery, with the diagnosis of COLD LEG  The various methods of treatment have been discussed with the patient and family. After consideration of risks, benefits and other options for treatment, the patient has consented to  Procedure(s): EMBOLECTOMY/FEMORAL (Left) as a surgical intervention .  The patient's history has been reviewed, patient examined, no change in status, stable for surgery.  I have reviewed the patient's chart and labs.  Questions were answered to the patient's satisfaction.     [name]

## 2013-11-02 NOTE — Anesthesia Postprocedure Evaluation (Signed)
  Anesthesia Post-op Note  Patient: [name]  Procedure(s) Performed: Procedure(s): LEFT FEMORAL EMBOLECTOMY, LEFT FEMORAL ARTERY ENDARTERECTOMY WITH DACRON PATCH ANGIOPLASTY. (Left)  Patient Location: PACU  Anesthesia Type:General  Level of Consciousness: awake  Airway and Oxygen Therapy: Patient Spontanous Breathing  Post-op Pain: mild  Post-op Assessment: Post-op Vital signs reviewed  Post-op Vital Signs: Reviewed  Complications: No apparent anesthesia complications

## 2013-11-02 NOTE — Interval H&P Note (Signed)
History and Physical Interval Note:  11/02/2013 11:18 AM  [name]  has presented today for surgery, with the diagnosis of claudication  The various methods of treatment have been discussed with the patient and family. After consideration of risks, benefits and other options for treatment, the patient has consented to  Procedure(s): LOWER EXTREMITY ANGIOGRAM (N/A) as a surgical intervention .  The patient's history has been reviewed, patient examined, no change in status, stable for surgery.  I have reviewed the patient's chart and labs.  Questions were answered to the patient's satisfaction.     Tawfiq Favila IV, V. WELLS

## 2013-11-02 NOTE — Transfer of Care (Signed)
Immediate Anesthesia Transfer of Care Note  Patient: [name]  Procedure(s) Performed: Procedure(s): LEFT FEMORAL EMBOLECTOMY, LEFT FEMORAL ARTERY ENDARTERECTOMY WITH DACRON PATCH ANGIOPLASTY. (Left)  Patient Location: PACU  Anesthesia Type:General  Level of Consciousness: awake, alert , oriented and patient cooperative  Airway & Oxygen Therapy: Patient Spontanous Breathing and Patient connected to face mask oxygen  Post-op Assessment: Report given to PACU RN, Post -op Vital signs reviewed and stable and Patient moving all extremities  Post vital signs: Reviewed and stable  Complications: No apparent anesthesia complications

## 2013-11-02 NOTE — Anesthesia Procedure Notes (Signed)
Procedure Name: Intubation Date/Time: 11/02/2013 1:34 PM Performed by: Darcey Nora B Pre-anesthesia Checklist: Patient identified, Emergency Drugs available, Suction available and Patient being monitored Patient Re-evaluated:Patient Re-evaluated prior to inductionOxygen Delivery Method: Circle system utilized Preoxygenation: Pre-oxygenation with 100% oxygen Intubation Type: IV induction Ventilation: Mask ventilation without difficulty Laryngoscope Size: Mac and 4 (Glidescope) Grade View: Grade II Tube type: Oral Tube size: 7.5 mm Number of attempts: 1 Airway Equipment and Method: Stylet and Video-laryngoscopy Placement Confirmation: ETT inserted through vocal cords under direct vision,  positive ETCO2 and breath sounds checked- equal and bilateral Secured at: 22 (cm at teeth) cm Tube secured with: Tape Dental Injury: Teeth and Oropharynx as per pre-operative assessment

## 2013-11-02 NOTE — Interval H&P Note (Signed)
History and Physical Interval Note:  11/02/2013 12:31 PM  [name]  has presented today for surgery, with the diagnosis of COLD LEG  The various methods of treatment have been discussed with the patient and family. After consideration of risks, benefits and other options for treatment, the patient has consented to  Procedure(s): EMBOLECTOMY/FEMORAL (Left) as a surgical intervention .  The patient's history has been reviewed, patient examined, no change in status, stable for surgery.  I have reviewed the patient's chart and labs.  Questions were answered to the patient's satisfaction.   Patient had angiography via right femoral approach. This reveals aortoiliac systems to be widely patent. There is near-total occlusion of left common femoral artery beginning at the inguinal ligament extending down to the origin of the superficial femoral and profunda. Because of contrast constraints with creatinine 2.2 no further angiography was performed.  Plan exploration left femoral artery with likely femoral embolectomy and possible patch angioplasty. Discussed with patient and his wife and both understand and are agreeable to proceed. Patient has decreased sensation left foot but no calf tenderness . We'll take directly to OR    [name]

## 2013-11-02 NOTE — Progress Notes (Signed)
Hep gtt stopped at this time in prep for cardiac cath.

## 2013-11-02 NOTE — Progress Notes (Signed)
ANTICOAGULATION CONSULT NOTE - Follow Up Consult  Pharmacy Consult for heparin Indication: ischemic leg  Labs:  Recent Labs  10/30/13 0532  11/01/13 1544 11/01/13 1643 11/02/13 0413  HGB  --   < > 13.7 16.0 14.1  HCT  --   --  41.2 47.0 41.1  PLT  --   --  228  --  224  LABPROT  --   --  13.2  --  14.7  INR  --   --  1.02  --  1.17  HEPARINUNFRC  --   --   --   --  <0.10*  CREATININE 1.46*  --   --  2.20*  --   < > = values in this interval not displayed.   Assessment: 72yo male undetectable on heparin with initial dosing for ischemic leg.  Goal of Therapy:  Heparin level 0.3-0.7 units/ml   Plan:  Will increase heparin gtt by 4 units/kgABW/hr to 1800 units/hr and check level in 8hr.  Vernard Gambles, PharmD, BCPS  11/02/2013,5:24 AM

## 2013-11-02 NOTE — H&P (View-Only) (Signed)
       Consult Note  Patient name: [name] MRN: [id number] DOB: 09/20/1941 Sex: male  Consulting Physician:  ER  Reason for Consult:  Chief Complaint  Patient presents with  . Leg Pain    HISTORY OF PRESENT ILLNESS: 72 yo male recently s/p bladder cancer surgery.  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 Hayes in 2009.  The patient began having symptoms of cramping and left leg pain beginning this morning.  He feels that his pain has improved since he has been here.  He denies acute numbness, he does have chronic tingling.  He denies sensory loss to the left foot.  The left foot is cooler to the touch.  The patient s s/p p[acemaker placement in 2011.  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  He is a diabetic on insulin.  His hypercholesterolomia is managed with a statin.  He is on chronic anticoagulation.  Past Medical History  Diagnosis Date  . Diabetes mellitus   . COPD (chronic obstructive pulmonary disease)     CPAP  . Renal insufficiency   . Hyperlipemia   . Other and unspecified coagulation defects   . Hypothyroidism   . Hyperkalemia   . 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 Sick sinus syndrome     s/p PPM by JA  . Pancreatitis   . Obesity   . Constipation   . OSA on CPAP     using CPAP although it is hard for him to tolerate  . SOB (shortness of breath)   . CAD (coronary artery disease)     not much  . Anginal pain     at times  . Hypertension     occasional  . Pacemaker   . Depression     recently  . Anxiety   . H/O Legionnaire's disease 2003  . History of blood clots     R groin  . Peripheral neuropathy   . Osteoarthritis     fingers  . GERD (gastroesophageal reflux disease)   . Cancer 2014    bladder cancer  . Anemia     hx of  . Pneumonia 2003    Past Surgical History  Procedure Laterality Date  . Nasal septum surgery  1967  . Pacemaker placement  06/2010      [employer] Jude [employer] Accent RF DR, Model PM2210 ( Serial number [id number])  . Thromboembolectomy and four compartment fasciotomy  2009  . Cardiac catheterization    . Cataract extraction Right   . Cholecystectomy  2012  . Insert / replace / remove pacemaker  2011  . Transurethral resection of bladder tumor with gyrus (turbt-gyrus) N/A 10/26/2013    Procedure: TRANSURETHRAL RESECTION OF BLADDER TUMOR WITH GYRUS (TURBT-GYRUS);  Surgeon: Theodore Manny, MD;  Location: [facility];  Service: Urology;  Laterality: N/A;  . Cystoscopy w/ ureteral stent placement Right 10/26/2013    Procedure: CYSTOSCOPY WITH RETROGRADE PYELOGRAM/URETERAL STENT PLACEMENT;  Surgeon: Theodore Manny, MD;  Location: [facility];  Service: Urology;  Laterality: Right;    History   Social History  . Marital Status: Married    Spouse Name: N/A    Number of Children: N/A  . Years of Education: N/A   Occupational History  . retired      textile industry   Social History Main Topics  . Smoking status: Former Smoker --   2.00 packs/day for 50 years    Types: Cigarettes    Quit date: 12/08/2006  . Smokeless tobacco: Never Used  . Alcohol Use: No     Comment: quit 3 years ago  . Drug Use: No  . Sexual Activity: Not on file   Other Topics Concern  . Not on file   Social History Narrative  . No narrative on file    Family History  Problem Relation Age of Onset  . Liver cancer Mother     deceased age 58  . Cancer Mother     liver cancer  . Colon cancer Neg Hx   . Heart attack Father     Allergies as of 11/01/2013 - Review Complete 11/01/2013  Allergen Reaction Noted  . Lactose intolerance (gi)  11/01/2013  . Pioglitazone      No current facility-administered medications on file prior to encounter.   Current Outpatient Prescriptions on File Prior to Encounter  Medication Sig Dispense Refill  . acetaminophen (TYLENOL) 500 MG tablet Take 500 mg by mouth every 6 (six) hours as needed for pain.      . albuterol  (PROVENTIL HFA;VENTOLIN HFA) 108 (90 BASE) MCG/ACT inhaler Inhale 2 puffs into the lungs every 6 (six) hours as needed for wheezing.      . ALPRAZolam (XANAX) 0.25 MG tablet Take 1 tablet (0.25 mg total) by mouth 3 (three) times daily as needed for anxiety or sleep.  50 tablet  0  . Ascorbic Acid (VITAMIN C) 500 MG tablet Take 500 mg by mouth daily.        . aspirin 325 MG tablet Take 325 mg by mouth daily.      . brimonidine (ALPHAGAN P) 0.1 % SOLN Place 1 drop into the left eye 3 (three) times daily.       . cholecalciferol (VITAMIN D) 1000 UNITS tablet Take 1,000 Units by mouth daily.      . dorzolamide-timolol (COSOPT) 22.3-6.8 MG/ML ophthalmic solution Place 1 drop into both eyes 2 (two) times daily.       . fluticasone (FLONASE) 50 MCG/ACT nasal spray Place 2 sprays into the nose daily.       . guaiFENesin (MUCINEX) 600 MG 12 hr tablet Take 600 mg by mouth 2 (two) times daily as needed for to loosen phlegm.      . latanoprost (XALATAN) 0.005 % ophthalmic solution Place 1 drop into both eyes at bedtime.       . levothyroxine (SYNTHROID, LEVOTHROID) 50 MCG tablet Take 50 mcg by mouth daily before breakfast.      . Multiple Vitamin (MULTIVITAMIN WITH MINERALS) TABS tablet Take 1 tablet by mouth daily.      . OMEGA-3 1000 MG CAPS Take 1 capsule by mouth daily.       . omeprazole (PRILOSEC) 20 MG capsule Take 1 capsule (20 mg total) by mouth daily.  30 capsule  4  . oxyCODONE-acetaminophen (ROXICET) 5-325 MG per tablet Take 1 tablet by mouth every 4 (four) hours as needed for moderate pain. Post-operatively  30 tablet  0  . Pancrelipase, Lip-Prot-Amyl, 25000 UNITS CPEP Take 25,000 Units by mouth 3 (three) times daily.       . Probiotic Product (PROBIOTIC ACIDOPHILUS) CAPS Take 1 capsule by mouth daily.       . rosuvastatin (CRESTOR) 40 MG tablet Take 20 mg by mouth every evening.      . senna-docusate (SENOKOT-S) 8.6-50 MG per tablet Take 1 tablet by mouth 2 (  two) times daily. While taking pain  meds to prevent constipation  30 tablet  0  . sulfamethoxazole-trimethoprim (BACTRIM DS) 800-160 MG per tablet Take 1 tablet by mouth 2 (two) times daily. X 3 days. Begin day prior to next Urology appointment for catheter removal.  6 tablet  0  . venlafaxine XR (EFFEXOR-XR) 150 MG 24 hr capsule Take 1 capsule (150 mg total) by mouth daily with breakfast.  30 capsule  11     REVIEW OF SYSTEMS:  See HPI and ED note from today.  O/w all systems negative PHYSICAL EXAMINATION: General: The patient appears their stated age.  Vital signs are BP 132/60  Pulse 59  Resp 22  Wt 295 lb (133.811 kg)  SpO2 99% Pulmonary: Respirations are non-labored HEENT:  No gross abnormalities Abdomen: Soft and non-tender  Musculoskeletal: There are no major deformities.   Neurologic: No focal weakness or paresthesias are detected, Skin: There are no ulcer or rashes noted. Psychiatric: The patient has normal affect. Cardiovascular: There is a regular rate and rhythm.  Left leg is much cooler to the touch.  Motor and sensory function are intact to both lower extremities  Diagnostic Studies: I was present for the arterial duplex.  He has calcified tibial vessels and so ABI's are unreliable.  Waveforms on the left were dampened and monophasic.  Artery was patent from hip to ankle.  No thrombus seen.  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  I wanted to get  A CTA to better evaluate this, but his Cr is 2.2.  Therefore, I will get an angiogram to help determine the etiology of his decreased circulation.  Since he has intact motor and sensory function of this left leg, I will admit him over night and place him on a Heparin drip with plans for angio in the morning     V. Wells Magdalena Skilton IV, M.D. [facility] Office: [phone] Pager:   [phone] 

## 2013-11-02 NOTE — Progress Notes (Signed)
Inpatient Diabetes Program Recommendations  AACE/ADA: New Consensus Statement on Inpatient Glycemic Control (2013)  Target Ranges:  Prepandial:   less than 140 mg/dL      Peak postprandial:   less than 180 mg/dL (1-2 hours)      Critically ill patients:  140 - 180 mg/dL  Results for EDENILSON, AUSTAD (MRN [id number]) as of 11/02/2013 12:01  Ref. Range 11/01/2013 21:04 11/02/2013 06:08  Glucose-Capillary Latest Range: 70-99 mg/dL 409 (H) 811 (H)    Inpatient Diabetes Program Recommendations Insulin - IV drip/GlucoStabilizer: use gtt especially during surgery Insulin - Basal: If not using gtt, start Lantus 40 BID  Correction (SSI): Increase to resistant Insulin - Meal Coverage: will likely need Novolog with meals once eating   Note: Patient uses U-500 concentrated insulin at home.  Currently ordered sensitive scale which is on hold for surgery. Thank you  Piedad Climes BSN, RN,CDE Inpatient Diabetes Coordinator [phone] (team pager)

## 2013-11-02 NOTE — Op Note (Signed)
OPERATIVE REPORT  Date of Surgery: 11/01/2013 - 11/02/2013  Surgeon: [name], MD  Assistant: Lorrine Kin  Pre-op Diagnosis: Left Leg Ischemia due to thrombus in left femoral artery Post-op Diagnosis: Due to severe occlusive disease in left common and superficial femoral artery with acute thrombosis  Procedure: Procedure(s): LEFT FEMORAL EMBOLECTOMY, LEFT FEMORAL ARTERY ENDARTERECTOMY WITH DACRON PATCH ANGIOPLASTY.  Anesthesia: General  EBL: 150 cc  Complications: None  Procedure Details: The patient was taken to the operating room placed in the supine position at which time satisfactory general endotracheal anesthesia was administered the left inguinal area and entire left lower extremity was prepped with Betadine scrub and solution draped in routine sterile manner. After an appropriate time out was called an oblique incision was made in the left inguinal area below the inguinal crease carried down through subcutaneous tissue. The common superficial and profunda femoris arteries were dissected free. There was a significant posterior plaque in the common femoral artery which became more severe in the distal common femoral and origin of the superficial femoral artery. Distal to this the superficial femoral artery had some mild calcific disease but was widely patent. There was a weak pulse the very proximal area of the dissection at the level of the inguinal ligament. Patient was given 7000 units of heparin intravenously. A longitudinal opening made in the common femoral artery 15 blade extended with Potts scissors up to the level of the external iliac. A 5 Fogarty catheter was passed proximally and some thrombus retrieved followed by excellent inflow. There was diffuse disease in the iliac system but the most severe area was in the common femoral artery. Following this 3 Fogarty catheter was passed distally which reversed the entire superficial femoral and popliteal artery into the  lower leg. Upon return of thrombus was retrieved consistent with the preoperative duplex scan. The plaque extended into the origin of the profunda which had good back bleeding. Following this endarterectomy was performed from the proximal clamp the external iliac level down to the origin of the upper fundus of which endarterectomized nicely and about 3 cm down the superficial femoral artery. The distal intima was tacked down with 6-0 Prolene sutures. After flushing with heparin saline a Dacron patch was sewn into place with 5-0 Prolene. Following antegrade and retrograde flushing closure was completed reestablishment of flow. There was excellent Doppler flow in the superficial femoral and profunda femoris arteries at the conclusion of the procedure. Protamine was given to reverse the heparin following adequate hemostasis the wound was closed in layers with Vicryl in a subcuticular fashion with Dermabond patient taken to the recovery room in stable condition   [name], MD 11/02/2013 3:36 PM

## 2013-11-02 NOTE — Preoperative (Addendum)
Beta Blockers   Reason not to administer Beta Blockers:Not applicable 

## 2013-11-02 NOTE — Progress Notes (Signed)
Hep gtt restarted; cath post poned at this time; will cont. To monitor.

## 2013-11-02 NOTE — Anesthesia Preprocedure Evaluation (Addendum)
Anesthesia Evaluation   Patient awake    Reviewed: Allergy & Precautions, H&P , NPO status , Patient's Chart, lab work & pertinent test results, reviewed documented beta blocker date and time   Airway Mallampati: II      Dental  (+) Teeth Intact, Missing and Dental Advisory Given   Pulmonary shortness of breath, sleep apnea and Continuous Positive Airway Pressure Ventilation , pneumonia -, resolved, COPD COPD inhaler, former smoker,  Prn bronchodilators         Cardiovascular hypertension, + angina + CAD and + Peripheral Vascular Disease + dysrhythmias + pacemaker     Neuro/Psych    GI/Hepatic GERD-  ,  Endo/Other  diabetes, Poorly Controlled, Type 2, Insulin DependentHypothyroidism On thyroid rx  Renal/GU Renal InsufficiencyRenal disease     Musculoskeletal   Abdominal   Peds  Hematology   Anesthesia Other Findings Beard  Reproductive/Obstetrics                         Anesthesia Physical Anesthesia Plan  ASA: IV  Anesthesia Plan: General   Post-op Pain Management:    Induction: Intravenous  Airway Management Planned: Oral ETT  Additional Equipment:   Intra-op Plan:   Post-operative Plan: Possible Post-op intubation/ventilation  Informed Consent: I have reviewed the patients History and Physical, chart, labs and discussed the procedure including the risks, benefits and alternatives for the proposed anesthesia with the patient or authorized representative who has indicated his/her understanding and acceptance.   Dental advisory given  Plan Discussed with: CRNA, Surgeon and Anesthesiologist  Anesthesia Plan Comments:         Anesthesia Quick Evaluation

## 2013-11-02 NOTE — OR Nursing (Addendum)
Dr. Hart Rochester visited patient upon arrival to the pacu.  He used the doppler to obtain pulses on both the Left DP and PT.  [name] moves his left foot to command.  When asked about numbness or tingling, he stated "There is a little of both but it is better than it was." Upon leaving, Dr. Hart Rochester stated to start an 800 unit per hour Heparin drip whenever it arrives.

## 2013-11-02 NOTE — Progress Notes (Signed)
UR completed 

## 2013-11-02 NOTE — Progress Notes (Signed)
Patient has been ambulating to the restroom using a walker and doing well. He says he has difficulty using the urinal. We have a urine hat in the bathroom and I have asked him to use it so we can document.  He is missing it quite often and urinating in the toilet. I have documented what we have caught. Will continue to monitor.

## 2013-11-03 LAB — GLUCOSE, CAPILLARY
Glucose-Capillary: 333 mg/dL — ABNORMAL HIGH (ref 70–99)
Glucose-Capillary: 420 mg/dL — ABNORMAL HIGH (ref 70–99)
Glucose-Capillary: 363 mg/dL — ABNORMAL HIGH (ref 70–99)
Glucose-Capillary: 149 mg/dL — ABNORMAL HIGH (ref 70–99)

## 2013-11-03 LAB — HEPARIN LEVEL (UNFRACTIONATED): Heparin Unfractionated: <0.1 IU/mL — ABNORMAL LOW (ref 0.30–0.70)

## 2013-11-03 LAB — CBC
HCT: 40.4 % (ref 39.0–52.0)
MCH: 31.9 pg (ref 26.0–34.0)
MCHC: 33.2 g/dL (ref 30.0–36.0)
Platelets: 208 10*3/uL (ref 150–400)
RDW: 14.7 % (ref 11.5–15.5)

## 2013-11-03 LAB — BASIC METABOLIC PANEL
CO2: 21 mEq/L (ref 19–32)
Chloride: 97 mEq/L (ref 96–112)
Creatinine, Ser: 1.72 mg/dL — ABNORMAL HIGH (ref 0.50–1.35)
GFR calc Af Amer: 45 mL/min — ABNORMAL LOW (ref >90)
GFR calc non Af Amer: 39 mL/min — ABNORMAL LOW (ref >90)
Glucose, Bld: 352 mg/dL — ABNORMAL HIGH (ref 70–99)
Potassium: 5.2 mEq/L — ABNORMAL HIGH (ref 3.5–5.1)

## 2013-11-03 MED ORDER — WARFARIN VIDEO
Freq: Once | Status: AC
  Administered 2013-11-03: 08:00:00

## 2013-11-03 MED ORDER — WARFARIN SODIUM 7.5 MG PO TABS
7.5000 mg | ORAL_TABLET | Freq: Once | ORAL | Status: AC
  Administered 2013-11-03: 7.5 mg via ORAL
  Filled 2013-11-03 (×2): qty 1

## 2013-11-03 MED ORDER — INSULIN ASPART 100 UNIT/ML ~~LOC~~ SOLN: 0.0000 [IU] | Freq: Every day | SUBCUTANEOUS | Status: DC

## 2013-11-03 MED ORDER — INSULIN ASPART 100 UNIT/ML ~~LOC~~ SOLN
0.0000 [IU] | Freq: Three times a day (TID) | SUBCUTANEOUS | Status: DC
  Administered 2013-11-03: 20 [IU] via SUBCUTANEOUS

## 2013-11-03 MED ORDER — COUMADIN BOOK
Freq: Once | Status: AC
  Administered 2013-11-03: 08:00:00
  Filled 2013-11-03: qty 1

## 2013-11-03 MED ORDER — WARFARIN - PHARMACIST DOSING INPATIENT: Freq: Every day | Status: DC

## 2013-11-03 NOTE — Progress Notes (Signed)
ANTICOAGULATION CONSULT NOTE   Pharmacy Consult for heparin/warfarin Indication: ischemic leg  Allergies  Allergen Reactions  . Lactose Intolerance (Gi)     Gi upset  . Pioglitazone     ACTOS  REACTION: Edema    Patient Measurements: Height: 5\' 10"  (177.8 cm) Weight: 297 lb 9.9 oz (135 kg) IBW/kg (Calculated) : 73; height= 70 inches Heparin Dosing Weight: 104 kg  Vital Signs: Temp: 97.8 F (36.6 C) (11/27 0409) Temp src: Oral (11/27 0409) BP: 128/57 mmHg (11/27 0409) Pulse Rate: 58 (11/27 0409)  Labs:  Recent Labs  11/01/13 1544 11/01/13 1643 11/02/13 0413 11/02/13 2026 11/03/13 0450  HGB 13.7 16.0 14.1 14.1 13.4  HCT 41.2 47.0 41.1 42.1 40.4  PLT 228  --  224 212 208  LABPROT 13.2  --  14.7  --   --   INR 1.02  --  1.17  --   --   HEPARINUNFRC  --   --  <0.10*  --  <0.10*  CREATININE  --  2.20* 2.03*  --  1.72*    Estimated Creatinine Clearance: 55.3 ml/min (by C-G formula based on Cr of 1.72).  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  INR was subtherapeutic at 1.02 on admission.  Patient is S/P left femoral embolectomy post op day 1. Renal function improving. Home dose of warfarin is 6mg  daily except 4 mg on Wed.  The last warfarin dose was taken on 11/24.  Will give conservative boost today since recent surgery. Heparin continues per MD.    Patient also has bladder cancer s/p resection of tumor with ureteral stent placement on 10/26/13.  Goal of Therapy:  INR goal: 2-3 Monitor platelets by anticoagulation protocol: Yes   Plan:  1) Warfarin 7.5mg  tonight 2) Monitor daily INR  Thank you, Piedad Climes, PharmD Clinical Pharmacist - Resident Pager: [phone] Pharmacy: [phone] 11/03/2013 7:47 AM

## 2013-11-03 NOTE — Progress Notes (Addendum)
Hypoglycemic Event  CBG: 56 Treatment: oral fluids/pm snack  Symptoms: none  Follow-up CBG:  CBG Result: 71  Possible Reasons for Event: not applicable      [name] D  Remember to initiate Hypoglycemia Order Set & complete

## 2013-11-03 NOTE — Progress Notes (Signed)
Pt transferred to 2W13 per MD order. Report called to receiving nurse and all questions answered.

## 2013-11-03 NOTE — Progress Notes (Addendum)
[facility] [place]  Subjective  - "My only pain is in the left groin area."   Objective 128/57 58 97.8 F (36.6 C) (Oral) 16 97%  Intake/Output Summary (Last 24 hours) at 11/03/13 0731 Last data filed at 11/03/13 0400  Gross per 24 hour  Intake 1179.4 ml  Output   2265 ml  Net -1085.6 ml    Left doppler signals 2+PT 1 DP Skin warm to touch Groin dressing in place.  No drainage and skin is soft around thye dressing.  Assessment/Planning: Procedure(s): LEFT FEMORAL EMBOLECTOMY, LEFT FEMORAL ARTERY ENDARTERECTOMY WITH DACRON PATCH ANGIOPLASTY.  1 Day Post-OpSurgeon(s): Pryor Ochoa, MD  Transfer to 2000 Glucose 300+ his wife is bring his insulin this morning he takes a non formulary concentrated dose.  Normal BS are 120. D/C foley Advance diet as tolerates.    Clinton Gallant [facility] 11/03/2013 7:31 AM --  Laboratory Lab Results:  Recent Labs  11/02/13 2026 11/03/13 0450  WBC 12.1* 9.8  HGB 14.1 13.4  HCT 42.1 40.4  PLT 212 208   BMET  Recent Labs  11/02/13 0413 11/03/13 0450  NA 132* 132*  K 4.0 5.2*  CL 95* 97  CO2 21 21  GLUCOSE 335* 352*  BUN 43* 35*  CREATININE 2.03* 1.72*  CALCIUM 9.1 8.3*    COAG Lab Results  Component Value Date   INR 1.17 11/02/2013   INR 1.02 11/01/2013   INR 1.07 10-05-202014   No results found for this basename: PTT       I have examined the patient, reviewed and agree with above. Comfortable. Well-perfused. Continue to mobilize. Resume Coumadin. Home in several days  Man Bonneau, MD 11/03/2013 10:16 AM

## 2013-11-04 DIAGNOSIS — Z48812 Encounter for surgical aftercare following surgery on the circulatory system: Secondary | ICD-10-CM

## 2013-11-04 DIAGNOSIS — M79609 Pain in unspecified limb: Secondary | ICD-10-CM

## 2013-11-04 LAB — PROTIME-INR
INR: 1.19 (ref 0.00–1.49)
Prothrombin Time: 14.8 seconds (ref 11.6–15.2)

## 2013-11-04 LAB — GLUCOSE, CAPILLARY
Glucose-Capillary: 356 mg/dL — ABNORMAL HIGH (ref 70–99)
Glucose-Capillary: 62 mg/dL — ABNORMAL LOW (ref 70–99)
Glucose-Capillary: 71 mg/dL (ref 70–99)
Glucose-Capillary: 105 mg/dL — ABNORMAL HIGH (ref 70–99)
Glucose-Capillary: 306 mg/dL — ABNORMAL HIGH (ref 70–99)

## 2013-11-04 LAB — APTT: aPTT: 29 seconds (ref 24–37)

## 2013-11-04 LAB — CBC
HCT: 44.1 % (ref 39.0–52.0)
Hemoglobin: 14.4 g/dL (ref 13.0–17.0)
MCHC: 32.7 g/dL (ref 30.0–36.0)
MCV: 95 fL (ref 78.0–100.0)
RBC: 4.64 MIL/uL (ref 4.22–5.81)
WBC: 9.4 10*3/uL (ref 4.0–10.5)

## 2013-11-04 LAB — POCT I-STAT GLUCOSE: Operator id: 151301

## 2013-11-04 MED ORDER — WARFARIN SODIUM 7.5 MG PO TABS
7.5000 mg | ORAL_TABLET | Freq: Once | ORAL | Status: DC
  Filled 2013-11-04: qty 1

## 2013-11-04 MED ORDER — FUROSEMIDE 40 MG PO TABS
40.0000 mg | ORAL_TABLET | Freq: Two times a day (BID) | ORAL | Status: DC
  Filled 2013-11-04: qty 1

## 2013-11-04 MED ORDER — FUROSEMIDE 10 MG/ML IJ SOLN
40.0000 mg | Freq: Two times a day (BID) | INTRAMUSCULAR | Status: DC
  Administered 2013-11-04: 40 mg via INTRAVENOUS
  Filled 2013-11-04: qty 4

## 2013-11-04 MED ORDER — ENOXAPARIN SODIUM 150 MG/ML ~~LOC~~ SOLN: 140.0000 mg | Freq: Two times a day (BID) | SUBCUTANEOUS | Status: DC

## 2013-11-04 MED ORDER — OXYCODONE HCL 5 MG PO TABS: 5.0000 mg | ORAL_TABLET | ORAL | Status: DC | PRN

## 2013-11-04 MED ORDER — INSULIN REGULAR HUMAN (CONC) 500 UNIT/ML ~~LOC~~ SOLN
50.0000 [IU] | Freq: Once | SUBCUTANEOUS | Status: AC
  Administered 2013-11-04: 50 [IU] via SUBCUTANEOUS
  Filled 2013-11-04: qty 20

## 2013-11-04 MED ORDER — ENOXAPARIN SODIUM 150 MG/ML ~~LOC~~ SOLN
140.0000 mg | Freq: Two times a day (BID) | SUBCUTANEOUS | Status: DC
  Administered 2013-11-04: 140 mg via SUBCUTANEOUS
  Filled 2013-11-04 (×2): qty 1

## 2013-11-04 NOTE — Discharge Summary (Signed)
[facility] Discharge Summary   Patient ID:  [name] MRN: [id number] DOB/AGE: 70-Aug-1942 70 y.o.  Admit date: 11/01/2013 Discharge date: 11/04/2013 Date of Surgery: 11/01/2013 - 11/02/2013 Surgeon: Moishe Spice): Pryor Ochoa, MD  Admission Diagnosis: Morbid obesity [278.01] Edema [782.3] DM type 2 (diabetes mellitus, type 2) [250.00] Ischemic leg [459.9]  Discharge Diagnoses:  Morbid obesity [278.01] Edema [782.3] DM type 2 (diabetes mellitus, type 2) [250.00] Ischemic leg [459.9]  Secondary Diagnoses: Past Medical History  Diagnosis Date  . Diabetes mellitus   . COPD (chronic obstructive pulmonary disease)     CPAP  . Renal insufficiency   . Hyperlipemia   . Other and unspecified coagulation defects   . Hypothyroidism   . Hyperkalemia   . 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 Sick sinus syndrome     s/p PPM by JA  . Pancreatitis   . Obesity   . Constipation   . OSA on CPAP     using CPAP although it is hard for him to tolerate  . SOB (shortness of breath)   . CAD (coronary artery disease)     not much  . Anginal pain     at times  . Hypertension     occasional  . Pacemaker   . Depression     recently  . Anxiety   . H/O Legionnaire's disease 2003  . History of blood clots     R groin  . Peripheral neuropathy   . Osteoarthritis     fingers  . GERD (gastroesophageal reflux disease)   . Cancer 2014    bladder cancer  . Anemia     hx of  . Pneumonia 2003    Procedure(s): LEFT FEMORAL EMBOLECTOMY, LEFT FEMORAL ARTERY ENDARTERECTOMY WITH DACRON PATCH ANGIOPLASTY.  Discharged Condition: good  HPI: 73 yo male recently s/p bladder cancer surgery. 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 Madilyn Fireman in 2009. The patient began having symptoms of cramping and left leg pain beginning this morning. He feels that his pain has improved since he has been  here. He denies acute numbness, he does have chronic tingling. He denies sensory loss to the left foot. The left foot is cooler to the touch.  The patient s s/p p[acemaker placement in 2011. 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 He is a diabetic on insulin. His hypercholesterolomia is managed with a statin. He is on chronic anticoagulation.  Dr. Estanislado Spire assessment was either embolic occlusion/stenosis of his left iliac or atherosclerotic stenosis / occlusion. I wanted to get A CTA to better evaluate this, but his Cr is 2.2. Therefore, I will get an angiogram to help determine the etiology of his decreased circulation. Since he has intact motor and sensory function of this left leg, I will admit him over night and place him on a Heparin drip with plans for angio in the morning. The angiogram was performed 11/02/2013 and showed left common femoral artery is nearly occlusive. There is irregular stenosis throughout the common femoral artery. There is delayed filling of the profunda and superficial femoral artery.  Dr. [name] took him to the OR 11/02/2013 and performed LEFT FEMORAL EMBOLECTOMY, LEFT FEMORAL ARTERY ENDARTERECTOMY WITH DACRON PATCH ANGIOPLASTY.  He had an uneventful night and was transferred to 200 unit.  His wife brought his home insulin secondary to the hospital not carrying a concentrated insulin  and his CBG was 300+.  He was placed on Heparin post-op to bridge him to therapeutic coumadin levels.  This am his BG was 105.  His left groin incision is clean and dry.  He will be discharged on Lovenox 140 mg SQ BID dosed per pharmacy. His wife will call [facility] and have his INR checked to adjust his dose of coumadin until he is between 2.0-3.0.        Hospital Course:  [name] is a 72 y.o. male is S/P Left Procedure(s): LEFT FEMORAL EMBOLECTOMY, LEFT FEMORAL ARTERY ENDARTERECTOMY WITH DACRON PATCH ANGIOPLASTY. Extubated: POD # 0 Physical exam: Left doppler signals 2+PT 1  DP  Skin warm to touch  Post-op wounds clean, dry, intact Pt. Ambulating, voiding and taking PO diet without difficulty. Pt pain controlled with PO pain meds. Labs as below Complications:See HPI  Consults:  Treatment Team:  Nada Libman, MD  Significant Diagnostic Studies: CBC Lab Results  Component Value Date   WBC 9.4 11/04/2013   HGB 14.4 11/04/2013   HCT 44.1 11/04/2013   MCV 95.0 11/04/2013   PLT 244 11/04/2013    BMET    Component Value Date/Time   NA 132* 11/03/2013 0450   K 5.2* 11/03/2013 0450   CL 97 11/03/2013 0450   CO2 21 11/03/2013 0450   GLUCOSE 352* 11/03/2013 0450   GLUCOSE 222* 09/29/2006 1016   BUN 35* 11/03/2013 0450   CREATININE 1.72* 11/03/2013 0450   CREATININE 1.54* 06/13/2011 1639   CALCIUM 8.3* 11/03/2013 0450   CALCIUM 9.0 05/12/2012 1039   GFRNONAA 39* 11/03/2013 0450   GFRAA 45* 11/03/2013 0450   COAG Lab Results  Component Value Date   INR 1.19 11/04/2013   INR 1.17 11/02/2013   INR 1.02 11/01/2013     Disposition:  Discharge to :Home Discharge Orders   Future Appointments Provider Department Dept Phone   11/09/2013 1:45 PM Romero Belling, MD [facility] [phone]   11/18/2013 11:15 AM Barbaraann Share, MD [facility] [phone]   12/27/2013 1:00 PM Romero Belling, MD [facility] [phone]   01/04/2014 12:00 PM [facility] [phone]   Future Orders Complete By Expires   Call MD for:  redness, tenderness, or signs of infection (pain, swelling, bleeding, redness, odor or green/yellow discharge around incision site)  As directed    Call MD for:  severe or increased pain, loss or decreased feeling  in affected limb(s)  As directed    Call MD for:  temperature >100.5  As directed    Driving Restrictions  As directed    Comments:     No driving for 2 weeks   Increase activity slowly  As directed    Comments:     Walk with  assistance use walker or cane as needed   Lifting restrictions  As directed    Comments:     No lifting for 6 weeks   May shower   As directed    Resume previous diet  As directed        Medication List         acetaminophen 500 MG tablet  Commonly known as:  TYLENOL  Take 500 mg by mouth every 6 (six) hours as needed for pain.     albuterol 108 (90 BASE) MCG/ACT inhaler  Commonly known as:  PROVENTIL HFA;VENTOLIN HFA  Inhale 2 puffs into the lungs every 6 (six)  hours as needed for wheezing.     ALPRAZolam 0.25 MG tablet  Commonly known as:  XANAX  Take 1 tablet (0.25 mg total) by mouth 3 (three) times daily as needed for anxiety or sleep.     aspirin 325 MG tablet  Take 325 mg by mouth daily.     brimonidine 0.1 % Soln  Commonly known as:  ALPHAGAN P  Place 1 drop into the left eye 3 (three) times daily.     cholecalciferol 1000 UNITS tablet  Commonly known as:  VITAMIN D  Take 1,000 Units by mouth daily.     dorzolamide-timolol 22.3-6.8 MG/ML ophthalmic solution  Commonly known as:  COSOPT  Place 1 drop into both eyes 2 (two) times daily.     enoxaparin 150 MG/ML injection  Commonly known as:  LOVENOX  Inject 0.93 mLs (140 mg total) into the skin every 12 (twelve) hours.     fluticasone 50 MCG/ACT nasal spray  Commonly known as:  FLONASE  Place 2 sprays into the nose daily.     furosemide 40 MG tablet  Commonly known as:  LASIX  Take 40 mg by mouth 2 (two) times daily.     guaiFENesin 600 MG 12 hr tablet  Commonly known as:  MUCINEX  Take 600 mg by mouth 2 (two) times daily as needed for to loosen phlegm.     HUMULIN R 500 UNIT/ML Soln injection  Generic drug:  insulin regular human CONCENTRATED  Inject 150-250 Units into the skin 4 (four) times daily -  with meals and at bedtime. Take 250 units (measured as 50 units or 0.5 cc on insulin syringe) at breakfast, lunch, and dinner.  Take 150 units (measured as 30 units or 0.3 cc on insulin syringe) at bedtime.      latanoprost 0.005 % ophthalmic solution  Commonly known as:  XALATAN  Place 1 drop into both eyes at bedtime.     levothyroxine 50 MCG tablet  Commonly known as:  SYNTHROID, LEVOTHROID  Take 50 mcg by mouth daily before breakfast.     multivitamin with minerals Tabs tablet  Take 1 tablet by mouth daily.     Omega-3 1000 MG Caps  Take 1 capsule by mouth daily.     omeprazole 20 MG capsule  Commonly known as:  PRILOSEC  Take 1 capsule (20 mg total) by mouth daily.     oxyCODONE 5 MG immediate release tablet  Commonly known as:  Oxy IR/ROXICODONE  Take 1-2 tablets (5-10 mg total) by mouth every 4 (four) hours as needed for moderate pain.     oxyCODONE-acetaminophen 5-325 MG per tablet  Commonly known as:  ROXICET  Take 1 tablet by mouth every 4 (four) hours as needed for moderate pain. Post-operatively     Pancrelipase (Lip-Prot-Amyl) 25000 UNITS Cpep  Take 25,000 Units by mouth 3 (three) times daily.     PROBIOTIC ACIDOPHILUS Caps  Take 1 capsule by mouth daily.     rosuvastatin 40 MG tablet  Commonly known as:  CRESTOR  Take 20 mg by mouth every evening.     senna-docusate 8.6-50 MG per tablet  Commonly known as:  Senokot-S  Take 1 tablet by mouth 2 (two) times daily. While taking pain meds to prevent constipation     sulfamethoxazole-trimethoprim 800-160 MG per tablet  Commonly known as:  BACTRIM DS  Take 1 tablet by mouth 2 (two) times daily. X 3 days. Begin day prior to next Urology appointment for catheter  removal.     traMADol 50 MG tablet  Commonly known as:  ULTRAM  Take 50 mg by mouth every 6 (six) hours as needed for moderate pain.     URIBEL 118 MG Caps  Take 118 mg by mouth 4 (four) times daily - after meals and at bedtime.     venlafaxine XR 150 MG 24 hr capsule  Commonly known as:  EFFEXOR-XR  Take 1 capsule (150 mg total) by mouth daily with breakfast.     vitamin C 500 MG tablet  Commonly known as:  ASCORBIC ACID  Take 500 mg by mouth  daily.     warfarin 4 MG tablet  Commonly known as:  COUMADIN  - Take 4-6 mg by mouth daily. 6mg  on M, Tu, Th, F, Sat, Sun  - 4mg  on Wed       Verbal and written Discharge instructions given to the patient. Wound care per Discharge AVS  F/U with Dr. Myra Gianotti in 2-3 weeks.  SignedMosetta Pigeon 11/04/2013, 2:09 PM

## 2013-11-04 NOTE — Progress Notes (Signed)
Inpatient Diabetes Program Recommendations  AACE/ADA: New Consensus Statement on Inpatient Glycemic Control (2013)  Target Ranges:  Prepandial:   less than 140 mg/dL      Peak postprandial:   less than 180 mg/dL (1-2 hours)      Critically ill patients:  140 - 180 mg/dL   Reason for Visit: referral received.  Patient is on U500 insulin per home regimen.  CBG's improved this morning.  Discussed with RN.  Patient requested decreased amount this AM (0.1 units (which equals 50 units of Regular insulin) due to CBG being 105 mg/dL.  Patient is to be discharged home today.  No further recommendations.  Beryl Meager, RN, BC-ADM Inpatient Diabetes Coordinator Pager [phone]

## 2013-11-04 NOTE — Progress Notes (Addendum)
ANTICOAGULATION CONSULT NOTE - Follow Up Consult  Pharmacy Consult for Warfarin; Heparin per MD Indication: ischemic left lower extremity  Allergies  Allergen Reactions  . Lactose Intolerance (Gi)     Gi upset  . Pioglitazone     ACTOS  REACTION: Edema    Patient Measurements: Height: 5\' 10"  (177.8 cm) Weight: 302 lb (136.986 kg) IBW/kg (Calculated) : 73 Heparin Dosing Weight: 105 kg  Vital Signs: Temp: 98.2 F (36.8 C) (11/28 0424) Temp src: Oral (11/28 0424) BP: 148/64 mmHg (11/28 0424) Pulse Rate: 65 (11/28 0424)  Labs:  Recent Labs  11/01/13 1544 11/01/13 1643 11/02/13 0413 11/02/13 2026 11/03/13 0450 11/03/13 1419 11/04/13 0525  HGB 13.7 16.0 14.1 14.1 13.4  --  14.4  HCT 41.2 47.0 41.1 42.1 40.4  --  44.1  PLT 228  --  224 212 208  --  244  APTT  --   --   --   --   --  26 29  LABPROT 13.2  --  14.7  --   --   --  14.8  INR 1.02  --  1.17  --   --   --  1.19  HEPARINUNFRC  --   --  <0.10*  --  <0.10*  --  <0.10*  CREATININE  --  2.20* 2.03*  --  1.72*  --   --     Estimated Creatinine Clearance: 55.7 ml/min (by C-G formula based on Cr of 1.72).   Medications:  Scheduled:  . acidophilus  1 capsule Oral Daily  . aspirin  325 mg Oral Daily  . atorvastatin  80 mg Oral q1800  . brimonidine  1 drop Both Eyes TID  . cholecalciferol  1,000 Units Oral Daily  . dorzolamide-timolol  1 drop Both Eyes BID  . fluticasone  2 spray Each Nare Daily  . furosemide  40 mg Intravenous BID  . furosemide  40 mg Oral BID  . insulin aspart  0-20 Units Subcutaneous TID WC  . insulin aspart  0-5 Units Subcutaneous QHS  . insulin regular human CONCENTRATED  150 Units Subcutaneous QHS  . insulin regular human CONCENTRATED  250 Units Subcutaneous TID WC  . latanoprost  1 drop Both Eyes QHS  . levothyroxine  50 mcg Oral QAC breakfast  . multivitamin with minerals  1 tablet Oral Daily  . omega-3 acid ethyl esters  1 g Oral Daily  . Pancrelipase (Lip-Prot-Amyl)  25,000 Units  Oral TID  . pantoprazole  40 mg Oral Daily  . senna-docusate  1 tablet Oral BID  . sulfamethoxazole-trimethoprim  1 tablet Oral BID  . URIBEL  118 mg Oral TID PC & HS  . venlafaxine XR  150 mg Oral Q breakfast  . vitamin C  500 mg Oral Daily  . Warfarin - Pharmacist Dosing Inpatient   Does not apply q1800   Infusions:  . sodium chloride 50 mL/hr at 11/03/13 0800  . DOPamine    . heparin 800 Units/hr (11/03/13 2201)    Assessment: 72 year old male on anticoagulation with Coumadin for peripheral vascular disease, and he is also receiving low-dose Heparin infusion per vascular surgery.  He is s/p left lower extremity endarterectomy and embolectomy this admission.  He also had a recent admission to [facility] with urology procedures.  From my review of records it appears he has only received 2 Coumadin doses in the past 16 days due to procedures and hospitalizations, so his current INR of 1.2  is not surprising.  He had Septra resumed from his home medication list.  This has a significant interaction with Coumadin.  However, the directions instruct the patient to only take Septra around the time his urinary catheter was to be removed.  His catheter has been removed (confirmed with RN) so will follow-up on need for continuing Septra therapy.  Goal of Therapy:  INR 2-3 Monitor platelets by anticoagulation protocol: Yes   Plan:  Coumadin 7.5mg  today. Heparin per Vascular Surgery team. Check daily heparin level, CBC, PT/INR  RN will ask PA when she returns to visit patient if Septra can be discontinued.  Estella Husk, Pharm.D., BCPS, AAHIVP Clinical Pharmacist Phone: [phone] or [phone] 11/04/2013, 10:02 AM  11/04/2013, 11:02 AM: Request received from Vascular to convert Heparin to Lovenox for DVT treatment in preparation for discharge to home. His renal function is adequate for q12h dosing.  His weight makes a consolidated once-daily dosing strategy too difficult for home  administration (would require more than 1 Lovenox syringe per dose).  Plan:  Stop Heparin now Start Lovenox 140mg  SQ q12h - give 1st dose 1 hour after stopping Heparin  Estella Husk, Pharm.D., BCPS, AAHIVP Clinical Pharmacist Phone: [phone] or [phone] 11/04/2013, 11:04 AM

## 2013-11-04 NOTE — Progress Notes (Addendum)
[facility] [place]  Subjective  - He is very disappointed that he is unable to go home today.  No new complaints of left leg pain.   Objective 148/64 65 98.2 F (36.8 C) (Oral) 18 100%  Intake/Output Summary (Last 24 hours) at 11/04/13 0711 Last data filed at 11/04/13 0600  Gross per 24 hour  Intake 985.04 ml  Output    650 ml  Net 335.04 ml   Left leg and foot are warm to touch Groin dressing in place.  Plan to change it after breakfast when he is lying down. Bilateral calf with mod nonpitting edema.  Assessment/Planning: Procedure(s): LEFT FEMORAL EMBOLECTOMY, LEFT FEMORAL ARTERY ENDARTERECTOMY WITH DACRON PATCH ANGIOPLASTY.  2 Days Post-OpSurgeon(s): Pryor Ochoa, MD Voiding independently Glucose better controlled on home medication insulin 105 this morning K 5.2  He is  taking lasix 40 mg PO.  Hold PO dose today and give 40 mg IV BID today. Recheck labs in am. INR 1.19 today after 7.5 dose of coumadin.  D/C home pending 2.0-3.0     Clinton Gallant [facility] 11/04/2013 7:11 AM --  Laboratory Lab Results:  Recent Labs  11/03/13 0450 11/04/13 0525  WBC 9.8 9.4  HGB 13.4 14.4  HCT 40.4 44.1  PLT 208 244   BMET  Recent Labs  11/02/13 0413 11/03/13 0450  NA 132* 132*  K 4.0 5.2*  CL 95* 97  CO2 21 21  GLUCOSE 335* 352*  BUN 43* 35*  CREATININE 2.03* 1.72*  CALCIUM 9.1 8.3*    COAG Lab Results  Component Value Date   INR 1.19 11/04/2013   INR 1.17 11/02/2013   INR 1.02 11/01/2013   No results found for this basename: PTT       I agree with the above I think he can go home with Lovenox bridge.  He is agreeable with this.  His INR will be checked on Tuesday.  Both legs are warm and perfused with adequate doppler signals.  Durene Cal

## 2013-11-04 NOTE — Progress Notes (Signed)
VASCULAR LAB PRELIMINARY  ARTERIAL  ABI completed:    RIGHT    LEFT    PRESSURE WAVEFORM  PRESSURE WAVEFORM  BRACHIAL 129 Triphasic BRACHIAL 148 Triphasic  DP >307 Biphasic DP >301 Monophasic  PT 201 Biphasic PT 255 Triphasic    RIGHT LEFT  ABI Rt N/A CA++  Lt 1.24 Rt N/A CA++ Lt 1.72   ABIs of the dorsalis pedis could not be ascertained due to incompressible vessels probably secondary to calcification bilaterally. Bilateral left ABIs indicate Doppler waveforms within normal limits with probable calcification.  Elisama Thissen, RVS 11/04/2013, 11:43 AM

## 2013-11-04 NOTE — Progress Notes (Signed)
Occupational Therapy Evaluation Patient Details Name: [name] MRN: [id number] DOB: 05-25-1941 Today's Date: 11/04/2013 Time: 1000-1025 OT Time Calculation (min): 25 min  OT Assessment / Plan / Recommendation History of present illness Pt adm with ischemic LLE and underwent Lt femoral thrombectomy with endarterectomy and angioplasty. PMHx includes recent bladder cancer surgery, DM, COPD, blind Rt eye, PAD on chronic Coumadin   Clinical Impression   Completed all education with pt regarding DME and use of AE to increase independence with LB ADL and decrease burden of care. Pt/wife verbalized understanding. OT signing off. Thanks    OT Assessment  Patient does not need any further OT services    Follow Up Recommendations  No OT follow up    Barriers to Discharge      Equipment Recommendations  Other (comment) (hip kit)    Recommendations for Other Services    Frequency       Precautions / Restrictions Precautions Precautions: None   Pertinent Vitals/Pain no apparent distress     ADL  Lower Body Bathing: Minimal assistance Where Assessed - Lower Body Bathing: Unsupported sit to stand Lower Body Dressing: Minimal assistance Where Assessed - Lower Body Dressing: Unsupported sit to stand Toilet Transfer: Modified independent Toilet Transfer Method: Other (comment) (ambulaing) Equipment Used: Sock aid;Reacher;Long-handled sponge;Long-handled shoe horn Transfers/Ambulation Related to ADLs: mod I ADL Comments: Educated pt/wife on use of AE for LB ADL. Pt verbalized understanding.    OT Diagnosis:    OT Problem List:   OT Treatment Interventions:     OT Goals(Current goals can be found in the care plan section) Acute Rehab OT Goals Patient Stated Goal: go home ASAP  Visit Information  Last OT Received On: 11/04/13 Assistance Needed: +1 History of Present Illness: Pt adm with ischemic LLE and underwent Lt femoral thrombectomy with endarterectomy and angioplasty. PMHx  includes recent bladder cancer surgery, DM, COPD, blind Rt eye, PAD on chronic Coumadin       Prior Functioning     Home Living Family/patient expects to be discharged to:: Private residence Living Arrangements: Spouse/significant other Available Help at Discharge: Family;Available 24 hours/day Type of Home: House Home Access: Stairs to enter;Ramped entrance Entrance Stairs-Number of Steps: 4 Entrance Stairs-Rails: Right Home Layout: One level Home Equipment: Emergency planning/management officer - 2 wheels;Cane - single point Prior Function Level of Independence: Needs assistance Gait / Transfers Assistance Needed: modified independent with cane; used walker when leg really hurting ADL's / Homemaking Assistance Needed: assist to don socks, can't bend forward enough Communication Communication: HOH         Vision/Perception Vision - History Baseline Vision: Wears glasses all the time   Cognition  Cognition Arousal/Alertness: Awake/alert (sleeping initially, easily aroused) Behavior During Therapy: WFL for tasks assessed/performed Overall Cognitive Status: Within Functional Limits for tasks assessed    Extremity/Trunk Assessment Upper Extremity Assessment Upper Extremity Assessment: Defer to OT evaluation Lower Extremity Assessment Lower Extremity Assessment: Overall WFL for tasks assessed Cervical / Trunk Assessment Cervical / Trunk Assessment: Normal     Mobility Bed Mobility Bed Mobility: Not assessed Transfers Sit to Stand: 6: Modified independent (Device/Increase time) Stand to Sit: 6: Modified independent (Device/Increase time) Details for Transfer Assistance: no cues needed for use of RW     Exercise General Exercises - Lower Extremity Ankle Circles/Pumps: AROM;10 reps;Seated   Balance     End of Session OT - End of Session Activity Tolerance: Patient tolerated treatment well Patient left: in chair;with call bell/phone within reach;with family/visitor present  Nurse  Communication: Mobility status  GO     Wayne Brunker,HILLARY 11/04/2013, 10:39 AM Luisa Dago, OTR/L  [phone] 11/04/2013

## 2013-11-04 NOTE — Progress Notes (Signed)
Discharged to home with family office visits in place teaching done  

## 2013-11-04 NOTE — Progress Notes (Addendum)
Unable to measure pt's output accurately. Pt unable to use urinal and urine hat properly. Pt refused to have condom catheter. Pt is voiding adequately.

## 2013-11-04 NOTE — Evaluation (Addendum)
Physical Therapy Evaluation Patient Details Name: [name] MRN: [id number] DOB: Apr 25, 1941 Today's Date: 11/04/2013 Time: 4098-1191 PT Time Calculation (min): 26 min  PT Assessment / Plan / Recommendation History of Present Illness  Pt adm with ischemic LLE and underwent Lt femoral thrombectomy with endarterectomy and angioplasty. PMHx includes recent bladder cancer surgery, DM, COPD, blind Rt eye, PAD on chronic Coumadin  Clinical Impression  Patient evaluated by Physical Therapy with no further acute PT needs identified. All education has been completed (including proper adjustment of RW as the one he has belonged to his now deceased mother-in-law) and the patient has no further questions.  See below for any follow-up Physial Therapy or equipment needs. PT is signing off. Thank you for this referral.     PT Assessment  Patent does not need any further PT services    Follow Up Recommendations  No PT follow up    Does the patient have the potential to tolerate intense rehabilitation      Barriers to Discharge        Equipment Recommendations  None recommended by PT    Recommendations for Other Services     Frequency      Precautions / Restrictions Precautions Precautions: None   Pertinent Vitals/Pain Denied pain throughout session      Mobility  Bed Mobility Bed Mobility: Not assessed Transfers Transfers: Sit to Stand;Stand to Sit Sit to Stand: 6: Modified independent (Device/Increase time) Stand to Sit: 6: Modified independent (Device/Increase time) Details for Transfer Assistance: no cues needed for use of RW Ambulation/Gait Ambulation/Gait Assistance: 5: Supervision;6: Modified independent (Device/Increase time) Ambulation Distance (Feet): 200 Feet Assistive device: Rolling walker Ambulation/Gait Assistance Details: initial cuing for safe use of RW and upright posture; pt then maintained correct use of RW, including in tight spaces Gait Pattern: Within  Functional Limits Stairs: No    Exercises General Exercises - Lower Extremity Ankle Circles/Pumps: AROM;10 reps;Seated Also educated on importance of ambulation for circulation and healing; Pt aware to call for assist to ambulate in hall 3 times/day   PT Diagnosis:    PT Problem List:   PT Treatment Interventions:       PT Goals(Current goals can be found in the care plan section) Acute Rehab PT Goals Patient Stated Goal: go home ASAP PT Goal Formulation: No goals set, d/c therapy  Visit Information  Last PT Received On: 11/04/13 Assistance Needed: +1 History of Present Illness: Pt adm with ischemic LLE and underwent Lt femoral thrombectomy with endarterectomy and angioplasty. PMHx includes recent bladder cancer surgery, DM, COPD, blind Rt eye, PAD on chronic Coumadin       Prior Functioning  Home Living Family/patient expects to be discharged to:: Private residence Living Arrangements: Spouse/significant other Available Help at Discharge: Family;Available 24 hours/day Type of Home: House Home Access: Stairs to enter;Ramped entrance Entrance Stairs-Number of Steps: 4 Entrance Stairs-Rails: Right Home Layout: One level Home Equipment: Emergency planning/management officer - 2 wheels;Cane - single point Prior Function Level of Independence: Needs assistance Gait / Transfers Assistance Needed: modified independent with cane; used walker when leg really hurting ADL's / Homemaking Assistance Needed: assist to don socks, can't bend forward enough Communication Communication: HOH    Cognition  Cognition Arousal/Alertness: Awake/alert (sleeping initially, easily aroused) Behavior During Therapy: WFL for tasks assessed/performed Overall Cognitive Status: Within Functional Limits for tasks assessed    Extremity/Trunk Assessment Upper Extremity Assessment Upper Extremity Assessment: Defer to OT evaluation Lower Extremity Assessment Lower Extremity Assessment: Overall WFL for  tasks  assessed Cervical / Trunk Assessment Cervical / Trunk Assessment: Normal   Balance    End of Session PT - End of Session Activity Tolerance: Patient tolerated treatment well Patient left: in chair;with call bell/phone within reach Nurse Communication: Mobility status;Other (comment) (d/c from PT)  GP     [name] 11/04/2013, 10:10 AM Pager [phone]

## 2013-11-07 NOTE — Discharge Summary (Signed)
I agree with the above  [name] 

## 2013-11-08 ENCOUNTER — Ambulatory Visit (INDEPENDENT_AMBULATORY_CARE_PROVIDER_SITE_OTHER): Payer: Medicare Other | Admitting: General Practice

## 2013-11-08 ENCOUNTER — Telehealth: Payer: Self-pay | Admitting: Surgery

## 2013-11-08 ENCOUNTER — Encounter (HOSPITAL_COMMUNITY): Payer: Self-pay | Admitting: Vascular Surgery

## 2013-11-08 DIAGNOSIS — I743 Embolism and thrombosis of arteries of the lower extremities: Secondary | ICD-10-CM

## 2013-11-08 LAB — POCT INR: INR: 1.6

## 2013-11-08 NOTE — Progress Notes (Signed)
Pre-visit discussion using our clinic review tool. No additional management support is needed unless otherwise documented below in the visit note.  

## 2013-11-08 NOTE — Telephone Encounter (Addendum)
Message copied by Fredrich Birks on Tue Nov 08, 2013 11:29 AM ------      Message from: Melene Plan      Created: Mon Nov 07, 2013  1:35 PM                   ----- Message -----         From: Lars Mage, PA-C         Sent: 11/04/2013   2:20 PM           To: Melene Plan, RN            F/U with Dr. Myra Gianotti in 2-3 weeks.  Left femoral embolectomy.  Lovenox bridging to coumadin.  Goes to [facility] coumadin clinic appt. Tuesday per wife. ------  11/08/13: lm for pt re appt, dpm

## 2013-11-09 ENCOUNTER — Ambulatory Visit (INDEPENDENT_AMBULATORY_CARE_PROVIDER_SITE_OTHER): Payer: Medicare Other | Admitting: Endocrinology

## 2013-11-09 ENCOUNTER — Encounter: Payer: Self-pay | Admitting: Endocrinology

## 2013-11-09 VITALS — BP 126/60 | HR 68 | Temp 97.5°F | Ht 70.0 in | Wt 301.6 lb

## 2013-11-09 DIAGNOSIS — I5032 Chronic diastolic (congestive) heart failure: Secondary | ICD-10-CM

## 2013-11-09 DIAGNOSIS — N259 Disorder resulting from impaired renal tubular function, unspecified: Secondary | ICD-10-CM

## 2013-11-09 LAB — BASIC METABOLIC PANEL
Chloride: 105 mEq/L (ref 96–112)
Creatinine, Ser: 1.5 mg/dL (ref 0.4–1.5)
GFR: 50.25 mL/min — ABNORMAL LOW (ref >60.00)

## 2013-11-09 LAB — BRAIN NATRIURETIC PEPTIDE: Pro B Natriuretic peptide (BNP): 64 pg/mL (ref 0.0–100.0)

## 2013-11-09 MED ORDER — FLUDROCORTISONE ACETATE 0.1 MG PO TABS: ORAL_TABLET | ORAL | Status: DC

## 2013-11-09 NOTE — Progress Notes (Signed)
Subjective:    Patient ID: [name], male    DOB: 08/09/1941, 72 y.o.   MRN: [id number]  HPI Pt returns for f/u of insulin-requiring DM (dx'ed 1988, when he presented with polyuria; he has has been on insulin since 1989; he has mild if any neuropathy of the lower extremities, and associated renal insufficiency and CAD; characterized by severe insulin resistance; he has never had severe hypoglycemia or DKA).  he brings a record of his cbg's which i have reviewed today.  It varies from 58-200.  It is in general higher as the day goes on.  He has mild hypoglycemia approx 4 times a week, usually in the early hrs of the morning.   Pt was seen by cardiol 3 mos ago.  It was determined that florinef (which pt takes for hyperkalemia) and lasix were counteracting.   When pt was recently in the hospital, lasix was resumed for fluid overload.  Pt was d/c'ed home on lasix 40/d.  However, he says this causes too much diuresis, and wants to reduce to 20/day.  No change in chronic doe.  He now takes just 40 mg qd. Past Medical History  Diagnosis Date  . Diabetes mellitus   . COPD (chronic obstructive pulmonary disease)     CPAP  . Renal insufficiency   . Hyperlipemia   . Other and unspecified coagulation defects   . Hypothyroidism   . Hyperkalemia   . 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 Sick sinus syndrome     s/p PPM by JA  . Pancreatitis   . Obesity   . Constipation   . OSA on CPAP     using CPAP although it is hard for him to tolerate  . SOB (shortness of breath)   . CAD (coronary artery disease)     not much  . Anginal pain     at times  . Hypertension     occasional  . Pacemaker   . Depression     recently  . Anxiety   . H/O Legionnaire's disease 2003  . History of blood clots     R groin  . Peripheral neuropathy   . Osteoarthritis     fingers  . GERD (gastroesophageal reflux disease)   . Cancer 2014    bladder cancer  . Anemia     hx of  . Pneumonia 2003     Past Surgical History  Procedure Laterality Date  . Nasal septum surgery  1967  . Pacemaker placement  06/2010    [facility] Acc[employer] RF DR, Model [id number] ( Serial number [id number])  . Thromboembolectomy and four compartment fasciotomy  2009  . Cardiac catheterization    . Cataract extraction Right   . Cholecystectomy  2012  . Insert / replace / remove pacemaker  2011  . Transurethral resection of bladder tumor with gyrus (turbt-gyrus) N/A 10/26/2013    Procedure: TRANSURETHRAL RESECTION OF BLADDER TUMOR WITH GYRUS (TURBT-GYRUS);  Surgeon: Sebastian Ache, MD;  Location: [facility];  Service: Urology;  Laterality: N/A;  . Cystoscopy w/ ureteral stent placement Right 10/26/2013    Procedure: CYSTOSCOPY WITH RETROGRADE PYELOGRAM/URETERAL STENT PLACEMENT;  Surgeon: Sebastian Ache, MD;  Location: [facility];  Service: Urology;  Laterality: Right;  . Embolectomy Left 11/02/2013    Procedure: LEFT FEMORAL EMBOLECTOMY, LEFT FEMORAL ARTERY ENDARTERECTOMY WITH DACRON PATCH ANGIOPLASTY.;  Surgeon: Pryor Ochoa, MD;  Location: [facility] OR;  Service: Vascular;  Laterality: Left;    History   Social History  . Marital Status: Married    Spouse Name: N/A    Number of Children: N/A  . 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 Smoking status: Former Smoker -- 2.00 packs/day for 50 years    Types: Cigarettes    Quit date: 12/08/2006  . Smokeless tobacco: Never Used  . Alcohol Use: No     Comment: quit 3 years ago  . Drug Use: No  . Sexual Activity: Not on file   Other Topics Concern  . Not on file   Social History Narrative  . No narrative on file    Current Outpatient Prescriptions on File Prior to Visit  Medication Sig Dispense Refill  . acetaminophen (TYLENOL) 500 MG tablet Take 500 mg by mouth every 6 (six) hours as needed for pain.      Marland Kitchen albuterol (PROVENTIL HFA;VENTOLIN HFA) 108 (90 BASE) MCG/ACT inhaler Inhale 2  puffs into the lungs every 6 (six) hours as needed for wheezing.      Marland Kitchen ALPRAZolam (XANAX) 0.25 MG tablet Take 1 tablet (0.25 mg total) by mouth 3 (three) times daily as needed for anxiety or sleep.  50 tablet  0  . Ascorbic Acid (VITAMIN C) 500 MG tablet Take 500 mg by mouth daily.        Marland Kitchen aspirin 325 MG tablet Take 325 mg by mouth daily.      . brimonidine (ALPHAGAN P) 0.1 % SOLN Place 1 drop into the left eye 3 (three) times daily.       . cholecalciferol (VITAMIN D) 1000 UNITS tablet Take 1,000 Units by mouth daily.      . dorzolamide-timolol (COSOPT) 22.3-6.8 MG/ML ophthalmic solution Place 1 drop into both eyes 2 (two) times daily.       Marland Kitchen enoxaparin (LOVENOX) 150 MG/ML injection Inject 0.93 mLs (140 mg total) into the skin every 12 (twelve) hours.  10 Syringe  1  . fluticasone (FLONASE) 50 MCG/ACT nasal spray Place 2 sprays into the nose daily.       . furosemide (LASIX) 40 MG tablet Take 40 mg by mouth daily.       Marland Kitchen guaiFENesin (MUCINEX) 600 MG 12 hr tablet Take 600 mg by mouth 2 (two) times daily as needed for to loosen phlegm.      . insulin regular human CONCENTRATED (HUMULIN R) 500 UNIT/ML SOLN injection Inject 300 Units into the skin 4 (four) times daily -  with meals and at bedtime. Because it is concentrated, please draw up as though it is 60 units at a time.      Marland Kitchen latanoprost (XALATAN) 0.005 % ophthalmic solution Place 1 drop into both eyes at bedtime.       Marland Kitchen levothyroxine (SYNTHROID, LEVOTHROID) 50 MCG tablet Take 50 mcg by mouth daily before breakfast.      . Meth-Hyo-M Bl-Na Phos-Ph Sal (URIBEL) 118 MG CAPS Take 118 mg by mouth 4 (four) times daily - after meals and at bedtime.      . Multiple Vitamin (MULTIVITAMIN WITH MINERALS) TABS tablet Take 1 tablet by mouth daily.      . OMEGA-3 1000 MG CAPS Take 1 capsule by mouth daily.       Marland Kitchen omeprazole (PRILOSEC) 20 MG capsule Take 1 capsule (20 mg total) by mouth daily.  30 capsule  4  .  oxyCODONE (OXY IR/ROXICODONE) 5 MG  immediate release tablet Take 1-2 tablets (5-10 mg total) by mouth every 4 (four) hours as needed for moderate pain.  30 tablet  0  . oxyCODONE-acetaminophen (ROXICET) 5-325 MG per tablet Take 1 tablet by mouth every 4 (four) hours as needed for moderate pain. Post-operatively  30 tablet  0  . Pancrelipase, Lip-Prot-Amyl, 25000 UNITS CPEP Take 25,000 Units by mouth 3 (three) times daily.       . Probiotic Product (PROBIOTIC ACIDOPHILUS) CAPS Take 1 capsule by mouth daily.       . rosuvastatin (CRESTOR) 40 MG tablet Take 20 mg by mouth every evening.      . senna-docusate (SENOKOT-S) 8.6-50 MG per tablet Take 1 tablet by mouth 2 (two) times daily. While taking pain meds to prevent constipation  30 tablet  0  . sulfamethoxazole-trimethoprim (BACTRIM DS) 800-160 MG per tablet Take 1 tablet by mouth 2 (two) times daily. X 3 days. Begin day prior to next Urology appointment for catheter removal.  6 tablet  0  . traMADol (ULTRAM) 50 MG tablet Take 50 mg by mouth every 6 (six) hours as needed for moderate pain.       Marland Kitchen venlafaxine XR (EFFEXOR-XR) 150 MG 24 hr capsule Take 1 capsule (150 mg total) by mouth daily with breakfast.  30 capsule  11  . warfarin (COUMADIN) 4 MG tablet Take 4-6 mg by mouth daily. 6mg  on M, Tu, Th, F, Sat, Sun 4mg  on Wed       No current facility-administered medications on file prior to visit.    Allergies  Allergen Reactions  . Lactose Intolerance (Gi)     Gi upset  . Pioglitazone     ACTOS  REACTION: Edema    Family History  Problem Relation Age of Onset  . Liver cancer Mother     deceased age 84  . Cancer Mother     liver cancer  . Colon cancer Neg Hx   . Heart attack Father     BP 126/60  Pulse 68  Temp(Src) 97.5 F (36.4 C) (Oral)  Ht 5\' 10"  (1.778 m)  Wt 301 lb 9 oz (136.788 kg)  BMI 43.27 kg/m2  SpO2 97%  Review of Systems Denies chest pain.  Depression is much better recently.    Objective:   Physical Exam VITAL SIGNS:  See vs page GENERAL: no  distress.  Morbid obesity LUNGS:  Clear to auscultation Ext: 1+ bilat leg edema.  Lab Results  Component Value Date   HGBA1C 7.9* 11/01/2013  BNP: normal    Assessment & Plan:  DM: This insulin regimen was chosen from multiple options, as it best matches his insulin to his changing requirements throughout the day.  The benefits of glycemic control must be weighed against the risks of hypoglycemia.  Apparently overcontrolled.   Hyperkalemia: he'll need to resume a low dosage of florinef. CHF: improved, but he'll need to continue lasix. Depression: improved

## 2013-11-09 NOTE — Patient Instructions (Addendum)
blood tests are being requested for you today.  We'll contact you with results.   Please reduce insulin to 300 units, 3x a day (just before each meal).   check your blood sugar twice a day.  vary the time of day when you check, between before the 3 meals, and at bedtime.  also check if you have symptoms of your blood sugar being too high or too low.  please keep a record of the readings and bring it to your next appointment here.  You can write it on any piece of paper.  please call us sooner if your blood sugar goes below 70, or if you have a lot of readings over 200. Please come back for a follow-up appointment in 6 weeks.

## 2013-11-11 ENCOUNTER — Ambulatory Visit (INDEPENDENT_AMBULATORY_CARE_PROVIDER_SITE_OTHER): Payer: Medicare Other | Admitting: General Practice

## 2013-11-11 DIAGNOSIS — I743 Embolism and thrombosis of arteries of the lower extremities: Secondary | ICD-10-CM

## 2013-11-11 LAB — POCT INR: INR: 2.6

## 2013-11-11 NOTE — Progress Notes (Signed)
Pre-visit discussion using our clinic review tool. No additional management support is needed unless otherwise documented below in the visit note.  

## 2013-11-14 ENCOUNTER — Other Ambulatory Visit: Payer: Self-pay | Admitting: Urology

## 2013-11-16 ENCOUNTER — Other Ambulatory Visit: Payer: Self-pay | Admitting: Gastroenterology

## 2013-11-18 ENCOUNTER — Telehealth: Payer: Self-pay | Admitting: Internal Medicine

## 2013-11-18 ENCOUNTER — Ambulatory Visit: Payer: Medicare Other | Admitting: Endocrinology

## 2013-11-18 ENCOUNTER — Telehealth: Payer: Self-pay | Admitting: General Practice

## 2013-11-18 ENCOUNTER — Ambulatory Visit (INDEPENDENT_AMBULATORY_CARE_PROVIDER_SITE_OTHER): Payer: Medicare Other | Admitting: Pulmonary Disease

## 2013-11-18 ENCOUNTER — Encounter: Payer: Self-pay | Admitting: Pulmonary Disease

## 2013-11-18 VITALS — BP 124/70 | HR 60 | Temp 97.6°F | Ht 70.0 in | Wt 312.0 lb

## 2013-11-18 DIAGNOSIS — G4733 Obstructive sleep apnea (adult) (pediatric): Secondary | ICD-10-CM

## 2013-11-18 NOTE — Patient Instructions (Signed)
Continue with cpap, and keep up with your mask changes and supplies. Work on weight reduction followup with me in one year.

## 2013-11-18 NOTE — Telephone Encounter (Signed)
Message copied by Garrison Columbus on Fri Nov 18, 2013  8:35 AM ------      Message from: Deliah Boston      Created: Wed Nov 16, 2013  5:08 PM       Patient will need a Lovenox bridge prior to his TURP.  This is scheduled for 01/11/14 and Dr Johney Frame has cleared  Dr Berneice Heinrich with [facility] is doing the surgery.            [name] ------

## 2013-11-18 NOTE — Telephone Encounter (Signed)
Received request from Nurse fax box, documents faxed for surgical clearance. To: [facility] specialist Fax number: [phone] Attention: 11/18/13/KM

## 2013-11-18 NOTE — Assessment & Plan Note (Signed)
The patient states that he is doing very well with CPAP, and is having no issues with his mask tolerance or pressure. He has been able to increase his usage time to at least 6 hours or more. I have asked him to keep up with his mask changes and supplies, and to work aggressively on weight loss. He will followup with me in one year if doing well.

## 2013-11-18 NOTE — Progress Notes (Signed)
   Subjective:    Patient ID: [name], male    DOB: Aug 27, 1941, 72 y.o.   MRN: [id number]  HPI Patient comes in today for followup of his obstructive sleep apnea. He tells me that he is wearing CPAP compliantly, and is having no issues with his mask fit or pressure. He has been increasing his sleep time with the device, and feels that it does help. Of note, the patient's weight is down 6 pounds since the last visit.   Review of Systems  Constitutional: Negative for fever and unexpected weight change.  HENT: Negative for congestion, dental problem, ear pain, nosebleeds, postnasal drip, rhinorrhea, sinus pressure, sneezing, sore throat and trouble swallowing.   Eyes: Negative for redness and itching.  Respiratory: Positive for shortness of breath ( with exertion). Negative for cough, chest tightness and wheezing.   Cardiovascular: Negative for palpitations and leg swelling.  Gastrointestinal: Negative for nausea and vomiting.  Genitourinary: Negative for dysuria.  Musculoskeletal: Negative for joint swelling.  Skin: Negative for rash.  Neurological: Negative for headaches.  Hematological: Does not bruise/bleed easily.  Psychiatric/Behavioral: Negative for dysphoric mood. The patient is not nervous/anxious.        Objective:   Physical Exam Morbidly obese male in no acute distress Nose without purulence or discharge noted No skin breakdown or pressure necrosis from the CPAP mask Neck without lymphadenopathy or thyromegaly Lower extremities with edema noted, no cyanosis Alert, but does appear mildly sleepy, moves all 4 extremities.       Assessment & Plan:

## 2013-11-24 ENCOUNTER — Other Ambulatory Visit: Payer: Self-pay

## 2013-11-24 MED ORDER — LEVOTHYROXINE SODIUM 50 MCG PO TABS: 50.0000 ug | ORAL_TABLET | Freq: Every day | ORAL | Status: DC

## 2013-11-30 ENCOUNTER — Encounter: Payer: Self-pay | Admitting: Surgery

## 2013-12-05 ENCOUNTER — Encounter: Payer: Self-pay | Admitting: Surgery

## 2013-12-05 ENCOUNTER — Ambulatory Visit (INDEPENDENT_AMBULATORY_CARE_PROVIDER_SITE_OTHER): Payer: Self-pay | Admitting: Surgery

## 2013-12-05 VITALS — BP 150/50 | HR 65 | Temp 98.3°F | Ht 70.0 in | Wt 314.0 lb

## 2013-12-05 DIAGNOSIS — I749 Embolism and thrombosis of unspecified artery: Secondary | ICD-10-CM

## 2013-12-05 DIAGNOSIS — I739 Peripheral vascular disease, unspecified: Secondary | ICD-10-CM

## 2013-12-05 DIAGNOSIS — I8290 Acute embolism and thrombosis of unspecified vein: Secondary | ICD-10-CM | POA: Insufficient documentation

## 2013-12-05 NOTE — Progress Notes (Signed)
The patient is here today for his first postoperative visit.  He presented to the emergency department on 11/02/2013 with pain and cramping in his left leg.  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 Madilyn Fireman in 2009.  The patient had a viable leg and therefore he was taken for angiography to better plan his operation.  Angiography revealed common femoral artery stenosis vs. occlusion, suggesting thrombus.  He underwent left femoral embolectomy with left femoral endarterectomy and dacryon patch angioplasty by Dr. Hart Rochester.  His postoperative course was uncomplicated.  He comes in today for followup.  He has no major complaints.  On examination his incision surprisingly has healed very nicely.  He has This dry despite his rather large pannus.  His left foot remains warm and well perfused.  Overall the patient has made excellent progress.  He is scheduled to followup in 6 months with an ultrasound and duplex of his repair

## 2013-12-05 NOTE — Addendum Note (Signed)
Addended by: Sharee Pimple on: 12/05/2013 10:13 AM   Modules accepted: Orders

## 2013-12-06 ENCOUNTER — Encounter: Payer: Self-pay | Admitting: *Deleted

## 2013-12-09 ENCOUNTER — Ambulatory Visit (INDEPENDENT_AMBULATORY_CARE_PROVIDER_SITE_OTHER): Payer: Commercial Managed Care - HMO | Admitting: General Practice

## 2013-12-09 DIAGNOSIS — I743 Embolism and thrombosis of arteries of the lower extremities: Secondary | ICD-10-CM

## 2013-12-09 LAB — POCT INR: INR: 2.7

## 2013-12-09 NOTE — Patient Instructions (Signed)
Instructions for coumadin and lovenox pre and post surgery.  1/30 - Take last dose of coumadin until after surgery 1/31 - Nothing 2/1 - Take Lovenox in AM and PM (12 hours apart) 2/2 - Lovenox in AM and PM 2/3 - Lovenox in AM only 2/4 - Procedure (Take nothing) 2/5 - Lovenox in AM and PM and 8 mg of coumadin 2/6 - Lovenox in AM and PM and 8 mg of coumadin 2/7 - Lovenox in AM and PM and 6 mg of coumadin 2/8 - Lovenox in AM and PM and 6 mg of coumadin 2/9 - Lovenox in AM and PM and 6 mg of coumadin 2/10 - Re-check INR in clinic  Patient verbalizes understanding of instructions.

## 2013-12-09 NOTE — Progress Notes (Signed)
Pre-visit discussion using our clinic review tool. No additional management support is needed unless otherwise documented below in the visit note.  

## 2013-12-13 ENCOUNTER — Telehealth: Payer: Self-pay | Admitting: Internal Medicine

## 2013-12-13 NOTE — Telephone Encounter (Signed)
New problem   Pt was cleared for sx by Dr Rayann Heman and he will need a lovenox bridge, Chastity  need to know if Dr Rayann Heman will take care of this. Please advise

## 2013-12-13 NOTE — Telephone Encounter (Signed)
Left message for [name] that we do not follow his INR's, looks like [facility] is who follows his INR's  Would ask them to bridge

## 2013-12-14 ENCOUNTER — Encounter: Payer: Self-pay | Admitting: Endocrinology

## 2013-12-14 ENCOUNTER — Ambulatory Visit (INDEPENDENT_AMBULATORY_CARE_PROVIDER_SITE_OTHER): Payer: Commercial Managed Care - HMO | Admitting: Endocrinology

## 2013-12-14 VITALS — BP 126/64 | HR 73 | Temp 98.3°F

## 2013-12-14 DIAGNOSIS — E875 Hyperkalemia: Secondary | ICD-10-CM

## 2013-12-14 DIAGNOSIS — C679 Malignant neoplasm of bladder, unspecified: Secondary | ICD-10-CM

## 2013-12-14 MED ORDER — FLUDROCORTISONE ACETATE 0.1 MG PO TABS: ORAL_TABLET | ORAL | Status: DC

## 2013-12-14 MED ORDER — FUROSEMIDE 40 MG PO TABS: 40.0000 mg | ORAL_TABLET | Freq: Every day | ORAL | Status: DC

## 2013-12-14 MED ORDER — LEVOTHYROXINE SODIUM 50 MCG PO TABS: 50.0000 ug | ORAL_TABLET | Freq: Every day | ORAL | Status: DC

## 2013-12-14 MED ORDER — OMEPRAZOLE 20 MG PO CPDR: 20.0000 mg | DELAYED_RELEASE_CAPSULE | Freq: Every day | ORAL | Status: DC

## 2013-12-14 NOTE — Progress Notes (Signed)
Subjective:    Patient ID: [name], male    DOB: 03-26-1941, 73 y.o.   MRN: [id number]  HPI Pt returns for f/u of insulin-requiring DM (dx'ed 1988, when he presented with polyuria; he has has been on insulin since 1989; he has mild if any neuropathy of the lower extremities, but he has associated renal insufficiency and CAD; characterized by severe insulin resistance; he has never had severe hypoglycemia or DKA).  he brings a record of his cbg's which i have reviewed today.  It varies from 45-200's.  There is no trend throughout the day.  He has mild hypoglycemia approx every few weeks, usually in the early hrs of the morning.   Pt still has doe.  He is not taking lasix.  Past Medical History  Diagnosis Date  . Diabetes mellitus   . COPD (chronic obstructive pulmonary disease)     CPAP  . Renal insufficiency   . Hyperlipemia   . Other and unspecified coagulation defects   . Hypothyroidism   . Hyperkalemia   . 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 Sick sinus syndrome     s/p PPM by JA  . Pancreatitis   . Obesity   . Constipation   . OSA on CPAP     using CPAP although it is hard for him to tolerate  . SOB (shortness of breath)   . CAD (coronary artery disease)     not much  . Anginal pain     at times  . Hypertension     occasional  . Pacemaker   . Depression     recently  . Anxiety   . H/O Legionnaire's disease 2003  . History of blood clots     R groin  . Peripheral neuropathy   . Osteoarthritis     fingers  . GERD (gastroesophageal reflux disease)   . Cancer 2014    bladder cancer  . Anemia     hx of  . Pneumonia 2003    Past Surgical History  Procedure Laterality Date  . Nasal septum surgery  1967  . Pacemaker placement  06/2010    [facility] Accent RF DR, Model [id number] ( Serial number [id number])  . Thromboembolectomy and four compartment fasciotomy  2009  . Cardiac catheterization    . Cataract extraction Right   . Cholecystectomy   2012  . Insert / replace / remove pacemaker  2011  . Transurethral resection of bladder tumor with gyrus (turbt-gyrus) N/A 10/26/2013    Procedure: TRANSURETHRAL RESECTION OF BLADDER TUMOR WITH GYRUS (TURBT-GYRUS);  Surgeon: Alexis Frock, MD;  Location: [facility];  Service: Urology;  Laterality: N/A;  . Cystoscopy w/ ureteral stent placement Right 10/26/2013    Procedure: CYSTOSCOPY WITH RETROGRADE PYELOGRAM/URETERAL STENT PLACEMENT;  Surgeon: Alexis Frock, MD;  Location: [facility];  Service: Urology;  Laterality: Right;  . Embolectomy Left 11/02/2013    Procedure: LEFT FEMORAL EMBOLECTOMY, LEFT FEMORAL ARTERY ENDARTERECTOMY WITH DACRON PATCH ANGIOPLASTY.;  Surgeon: Mal Misty, MD;  Location: [facility];  Service: Vascular;  Laterality: Left;    History   Social History  . Marital Status: Married    Spouse Name: N/A    Number of Children: N/A  . 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 Smoking status: Former Smoker -- 2.00 packs/day for 50  years    Types: Cigarettes    Quit date: 12/08/2006  . Smokeless tobacco: Never Used  . Alcohol Use: No     Comment: quit 3 years ago  . Drug Use: No  . Sexual Activity: Not on file   Other Topics Concern  . Not on file   Social History Narrative  . No narrative on file    Current Outpatient Prescriptions on File Prior to Visit  Medication Sig Dispense Refill  . acetaminophen (TYLENOL) 500 MG tablet Take 500 mg by mouth every 6 (six) hours as needed for pain.      Marland Kitchen albuterol (PROVENTIL HFA;VENTOLIN HFA) 108 (90 BASE) MCG/ACT inhaler Inhale 2 puffs into the lungs every 6 (six) hours as needed for wheezing.      Marland Kitchen ALPRAZolam (XANAX) 0.25 MG tablet Take 1 tablet (0.25 mg total) by mouth 3 (three) times daily as needed for anxiety or sleep.  50 tablet  0  . Ascorbic Acid (VITAMIN C) 500 MG tablet Take 500 mg by mouth daily.        Marland Kitchen aspirin 325 MG tablet Take 325 mg by  mouth daily.      . brimonidine (ALPHAGAN P) 0.1 % SOLN Place 1 drop into the left eye 3 (three) times daily.       . cholecalciferol (VITAMIN D) 1000 UNITS tablet Take 1,000 Units by mouth daily.      . dorzolamide-timolol (COSOPT) 22.3-6.8 MG/ML ophthalmic solution Place 1 drop into both eyes 2 (two) times daily.       . fluticasone (FLONASE) 50 MCG/ACT nasal spray Place 2 sprays into the nose daily.       Marland Kitchen guaiFENesin (MUCINEX) 600 MG 12 hr tablet Take 600 mg by mouth 2 (two) times daily as needed for to loosen phlegm.      . insulin regular human CONCENTRATED (HUMULIN R) 500 UNIT/ML SOLN injection Inject 300 Units into the skin 4 (four) times daily -  with meals and at bedtime. Because it is concentrated, please draw up as though it is 60 units at a time.      Marland Kitchen latanoprost (XALATAN) 0.005 % ophthalmic solution Place 1 drop into both eyes at bedtime.       . Multiple Vitamin (MULTIVITAMIN WITH MINERALS) TABS tablet Take 1 tablet by mouth daily.      . OMEGA-3 1000 MG CAPS Take 1 capsule by mouth daily.       . Pancrelipase, Lip-Prot-Amyl, 25000 UNITS CPEP Take 25,000 Units by mouth 3 (three) times daily.       . Probiotic Product (PROBIOTIC ACIDOPHILUS) CAPS Take 1 capsule by mouth daily.       . rosuvastatin (CRESTOR) 40 MG tablet Take 20 mg by mouth every evening.      . traMADol (ULTRAM) 50 MG tablet Take 50 mg by mouth every 6 (six) hours as needed for moderate pain.       Marland Kitchen venlafaxine XR (EFFEXOR-XR) 150 MG 24 hr capsule Take 1 capsule (150 mg total) by mouth daily with breakfast.  30 capsule  11  . warfarin (COUMADIN) 4 MG tablet Take 4-6 mg by mouth daily. 6mg  on M, Tu, Th, F, Sat, Sun 4mg  on Wed       No current facility-administered medications on file prior to visit.    Allergies  Allergen Reactions  . Lactose Intolerance (Gi)     Gi upset  . Pioglitazone     ACTOS  REACTION: Edema  Family History  Problem Relation Age of Onset  . Liver cancer Mother     deceased age  58  . Cancer Mother     liver cancer  . Colon cancer Neg Hx   . Heart attack Father     BP 126/64  Pulse 73  Temp(Src) 98.3 F (36.8 C) (Oral)  SpO2 95%  Review of Systems Denies LOC.  Anxiety is much better recently.      Objective:   Physical Exam VITAL SIGNS:  See vs page GENERAL: no distress Ext: 2+ bilat leg edema  Lab Results  Component Value Date   HGBA1C 7.9* 11/01/2013      Assessment & Plan:  DM: This insulin regimen was chosen from multiple options, as it best matches his insulin to his changing requirements throughout the day.  The benefits of glycemic control must be weighed against the risks of hypoglycemia.  this is the best control this pt should aim for, given these variable cbg's.   CHF: he needs to resume lasix Anxiety: much better

## 2013-12-14 NOTE — Patient Instructions (Addendum)
Please resume the lasix.  i have sent a prescription to your pharmacy, to refill.  check your blood sugar twice a day.  vary the time of day when you check, between before the 3 meals, and at bedtime.  also check if you have symptoms of your blood sugar being too high or too low.  please keep a record of the readings and bring it to your next appointment here.  You can write it on any piece of paper.  please call us sooner if your blood sugar goes below 70, or if you have a lot of readings over 200. Please come back for a follow-up appointment in 2 months.

## 2013-12-15 ENCOUNTER — Telehealth: Payer: Self-pay

## 2013-12-15 MED ORDER — FLUDROCORTISONE ACETATE 0.1 MG PO TABS: ORAL_TABLET | ORAL | Status: DC

## 2013-12-15 MED ORDER — OMEPRAZOLE 20 MG PO CPDR: 20.0000 mg | DELAYED_RELEASE_CAPSULE | Freq: Every day | ORAL | Status: DC

## 2013-12-15 MED ORDER — LEVOTHYROXINE SODIUM 50 MCG PO TABS
50.0000 ug | ORAL_TABLET | Freq: Every day | ORAL | Status: DC
Start: 2013-12-15 — End: 2014-01-02

## 2013-12-15 MED ORDER — FUROSEMIDE 40 MG PO TABS: 40.0000 mg | ORAL_TABLET | Freq: Every day | ORAL | Status: DC

## 2013-12-15 NOTE — Telephone Encounter (Signed)
[facility]

## 2013-12-23 ENCOUNTER — Telehealth: Payer: Self-pay

## 2013-12-23 DIAGNOSIS — C679 Malignant neoplasm of bladder, unspecified: Secondary | ICD-10-CM

## 2013-12-23 NOTE — Telephone Encounter (Signed)
done

## 2013-12-23 NOTE — Telephone Encounter (Signed)
Pt called requesting that referral be put in for his up coming visit with [facility]. Pt states that he has an appointment coming up on January 28 th for urine culture and on February 4 th pt is going to have surgery to remove a tumor on his bladder. Pt stated that insurance is requiring a referral for both of these.   Please advise,  Thanks!

## 2013-12-23 NOTE — Telephone Encounter (Signed)
Pt informed

## 2013-12-26 ENCOUNTER — Other Ambulatory Visit: Payer: Self-pay | Admitting: General Practice

## 2013-12-26 MED ORDER — ENOXAPARIN SODIUM 150 MG/ML ~~LOC~~ SOLN: 1.0000 mg/kg | Freq: Two times a day (BID) | SUBCUTANEOUS | Status: DC

## 2013-12-27 ENCOUNTER — Ambulatory Visit: Payer: Medicare Other | Admitting: Endocrinology

## 2014-01-02 ENCOUNTER — Telehealth: Payer: Self-pay

## 2014-01-02 ENCOUNTER — Encounter (HOSPITAL_COMMUNITY): Payer: Self-pay | Admitting: Pharmacy Technician

## 2014-01-02 DIAGNOSIS — I5032 Chronic diastolic (congestive) heart failure: Secondary | ICD-10-CM

## 2014-01-02 NOTE — Telephone Encounter (Signed)
done

## 2014-01-02 NOTE — Telephone Encounter (Signed)
Pt called requesting a referral to [facility] for an appointment with Dr. Rayann Heman for January 28th.  Please advise, Thanks!

## 2014-01-03 ENCOUNTER — Encounter (HOSPITAL_COMMUNITY)
Admission: RE | Admit: 2014-01-03 | Discharge: 2014-01-03 | Disposition: A | Discharge status: Home / Self Care | Payer: Medicare HMO | Source: Ambulatory Visit | Attending: Urology | Admitting: Urology

## 2014-01-03 ENCOUNTER — Ambulatory Visit (HOSPITAL_COMMUNITY)
Admission: RE | Admit: 2014-01-03 | Discharge: 2014-01-03 | Disposition: A | Discharge status: Home / Self Care | Payer: Medicare HMO | Source: Ambulatory Visit | Attending: Urology | Admitting: Urology

## 2014-01-03 ENCOUNTER — Encounter (HOSPITAL_COMMUNITY): Payer: Self-pay

## 2014-01-03 DIAGNOSIS — R059 Cough, unspecified: Secondary | ICD-10-CM | POA: Insufficient documentation

## 2014-01-03 DIAGNOSIS — M47814 Spondylosis without myelopathy or radiculopathy, thoracic region: Secondary | ICD-10-CM | POA: Insufficient documentation

## 2014-01-03 DIAGNOSIS — R05 Cough: Secondary | ICD-10-CM | POA: Insufficient documentation

## 2014-01-03 DIAGNOSIS — R0989 Other specified symptoms and signs involving the circulatory and respiratory systems: Secondary | ICD-10-CM | POA: Insufficient documentation

## 2014-01-03 LAB — BASIC METABOLIC PANEL
BUN: 33 mg/dL — AB (ref 6–23)
CHLORIDE: 102 meq/L (ref 96–112)
CO2: 23 meq/L (ref 19–32)
CREATININE: 1.25 mg/dL (ref 0.50–1.35)
Calcium: 8.7 mg/dL (ref 8.4–10.5)
GFR calc Af Amer: 66 mL/min — ABNORMAL LOW (ref >90)
GFR calc non Af Amer: 57 mL/min — ABNORMAL LOW (ref >90)
GLUCOSE: 180 mg/dL — AB (ref 70–99)
Potassium: 4.5 mEq/L (ref 3.7–5.3)
Sodium: 140 mEq/L (ref 137–147)

## 2014-01-03 LAB — CBC
HEMATOCRIT: 39.2 % (ref 39.0–52.0)
HEMOGLOBIN: 12.8 g/dL — AB (ref 13.0–17.0)
MCH: 30.7 pg (ref 26.0–34.0)
MCHC: 32.7 g/dL (ref 30.0–36.0)
MCV: 94 fL (ref 78.0–100.0)
PLATELETS: 233 10*3/uL (ref 150–400)
RBC: 4.17 MIL/uL — AB (ref 4.22–5.81)
RDW: 15 % (ref 11.5–15.5)
WBC: 9.8 10*3/uL (ref 4.0–10.5)

## 2014-01-03 LAB — APTT: aPTT: 39 seconds — ABNORMAL HIGH (ref 24–37)

## 2014-01-03 LAB — SURGICAL PCR SCREEN
MRSA, PCR: POSITIVE — AB
Staphylococcus aureus: POSITIVE — AB

## 2014-01-03 LAB — PROTIME-INR
INR: 2.27 — ABNORMAL HIGH (ref 0.00–1.49)
Prothrombin Time: 24.3 seconds — ABNORMAL HIGH (ref 11.6–15.2)

## 2014-01-03 NOTE — Pre-Procedure Instructions (Signed)
EKG REPORT 10/27/13 , [facility] OFFICE NOTE 08/09/13 ARE IN EPIC.  CLEARANCE FOR UROLOGY SURGERY ON CHART FROM DR, Rayann Heman. CXR WAS DONE TODAY - PREOP.

## 2014-01-03 NOTE — Patient Instructions (Signed)
YOUR SURGERY IS SCHEDULED AT [facility]  ON:  Wednesday  2/4  REPORT TO  SHORT STAY CENTER AT:  10:30 AM      PHONE # FOR SHORT STAY IS [phone]  DO NOT EAT OR DRINK ANYTHING AFTER MIDNIGHT THE NIGHT BEFORE YOUR SURGERY.  YOU MAY BRUSH YOUR TEETH, RINSE OUT YOUR MOUTH--BUT NO WATER, NO FOOD, NO CHEWING GUM, NO MINTS, NO CANDIES, NO CHEWING TOBACCO.  PLEASE TAKE THE FOLLOWING MEDICATIONS THE AM OF YOUR SURGERY WITH A FEW SIPS OF WATER:  ALPRAZOLAM, LEVOTHYROXIN, OMEPRAZOLE. USE YOUR ALBUTEROL INHALER.  USE YOUR ALPHAGAN AND DORZOLAMIDE EYE DROPS.   Isabel.   IF YOU ARE DIABETIC:  DO NOT TAKE ANY DIABETIC MEDICATIONS THE AM OF YOUR SURGERY.  IF YOU TAKE INSULIN IN THE EVENINGS--PLEASE ONLY TAKE 1/2 NORMAL EVENING DOSE THE NIGHT BEFORE YOUR SURGERY.  NO INSULIN THE AM OF YOUR SURGERY. IF YOU HAVE SLEEP APNEA AND USE CPAP OR BIPAP--PLEASE BRING THE MASK AND THE TUBING.  DO NOT BRING YOUR MACHINE.  DO NOT BRING VALUABLES, MONEY, CREDIT CARDS.  DO NOT WEAR JEWELRY, MAKE-UP, NAIL POLISH AND NO METAL PINS OR CLIPS IN YOUR HAIR. CONTACT LENS, DENTURES / PARTIALS, GLASSES SHOULD NOT BE WORN TO SURGERY AND IN MOST CASES-HEARING AIDS WILL NEED TO BE REMOVED.  BRING YOUR GLASSES CASE, ANY EQUIPMENT NEEDED FOR YOUR CONTACT LENS. FOR PATIENTS ADMITTED TO THE HOSPITAL--CHECK OUT TIME THE DAY OF DISCHARGE IS 11:00 AM.  ALL INPATIENT ROOMS ARE PRIVATE - WITH BATHROOM, TELEPHONE, TELEVISION AND WIFI INTERNET.  IF YOU ARE BEING DISCHARGED THE SAME DAY OF YOUR SURGERY--YOU CAN NOT DRIVE YOURSELF HOME--AND SHOULD NOT GO HOME ALONE BY TAXI OR BUS.  NO DRIVING OR OPERATING MACHINERY, DO NOT MAKE LEGAL DECISIONS FOR 24 HOURS FOLLOWING ANESTHESIA / PAIN MEDICATIONS.  PLEASE MAKE ARRANGEMENTS FOR SOMEONE TO BE WITH YOU AT HOME THE FIRST 24 HOURS AFTER SURGERY. RESPONSIBLE DRIVER'S NAME / PHONE  PT'S WIFE LINDA Starkes  [phone]                                                   PLEASE  READ OVER ANY  FACT SHEETS THAT YOU WERE GIVEN: MRSA INFORMATION  FAILURE TO FOLLOW THESE INSTRUCTIONS MAY RESULT IN THE CANCELLATION OF YOUR SURGERY. PLEASE BE AWARE THAT YOU MAY NEED ADDITIONAL BLOOD DRAWN DAY OF YOUR SURGERY  PATIENT SIGNATURE_________________________________

## 2014-01-03 NOTE — Pre-Procedure Instructions (Signed)
PT'S PREOP BMET, PT, INR, PTT REPORTS FAXED TO DR. MANNY'S OFFICE FOR REVIEW AND ORDER IN EPIC TO REPEAT PT, INR DAY OF SURGERY AS PER ANESTHESIOLOGIST'S GUIDELINES.

## 2014-01-03 NOTE — Telephone Encounter (Signed)
Pt informed

## 2014-01-04 ENCOUNTER — Ambulatory Visit (INDEPENDENT_AMBULATORY_CARE_PROVIDER_SITE_OTHER): Payer: Medicare HMO | Admitting: *Deleted

## 2014-01-04 ENCOUNTER — Encounter: Payer: Self-pay | Admitting: Internal Medicine

## 2014-01-04 DIAGNOSIS — I4891 Unspecified atrial fibrillation: Secondary | ICD-10-CM

## 2014-01-04 LAB — MDC_IDC_ENUM_SESS_TYPE_INCLINIC
Battery Remaining Longevity: 96 mo
Brady Statistic RA Percent Paced: 92 %
Brady Statistic RV Percent Paced: 3.6 %
Implantable Pulse Generator Model: 2210
Lead Channel Impedance Value: 337.5 Ohm
Lead Channel Impedance Value: 400 Ohm
Lead Channel Pacing Threshold Pulse Width: 0.5 ms
Lead Channel Sensing Intrinsic Amplitude: 3.2 mV
Lead Channel Sensing Intrinsic Amplitude: 7 mV
Lead Channel Setting Pacing Amplitude: 1.75 V
Lead Channel Setting Pacing Pulse Width: 0.5 ms
Lead Channel Setting Sensing Sensitivity: 2 mV
MDC IDC MSMT BATTERY VOLTAGE: 2.96 V
MDC IDC MSMT LEADCHNL RA PACING THRESHOLD AMPLITUDE: 0.75 V
MDC IDC MSMT LEADCHNL RA PACING THRESHOLD PULSEWIDTH: 0.5 ms
MDC IDC MSMT LEADCHNL RV PACING THRESHOLD AMPLITUDE: 0.75 V
MDC IDC PG SERIAL: 7152830
MDC IDC SESS DTM: 20150128143212
MDC IDC SET LEADCHNL RV PACING AMPLITUDE: 1 V

## 2014-01-04 NOTE — Pre-Procedure Instructions (Signed)
NOTIFIED [facility] REP SANDY SEXTON THAT PT IS HAVING SURGERY 01/11/14 AT 12:30 PM AND DR. ALLRED'S ORDERS REGARDING PT'S PACEMAKER STATES "ASYNCHRONOUS PACING DURING PROCEDURE AND RETURNED TO NORMAL PROGRAMMING AFTER PROCEDURE".  [facility] REP WILL MEET PT IN OR AROUND 11:30 AM DAY OF SURGERY.

## 2014-01-04 NOTE — Progress Notes (Signed)
Pacemaker check in clinic. Normal device function. Thresholds, sensing, impedances consistent with previous measurements. Device programmed to maximize longevity. 3.9% mode switch + coumadin. Numerous AT/AF episodes show intrinsic P-waves occurring during PVARP followed by RA pacing---shortend PVARP from 300 to 272ms. No high ventricular rates noted. Device programmed at appropriate safety margins. Histogram distribution appropriate for patient activity level. Device programmed to optimize intrinsic conduction. Estimated longevity 8.68yrs. Pt cannot hear alert tone.   ROV w/ Dr. Rayann Heman in 23mo.

## 2014-01-09 ENCOUNTER — Other Ambulatory Visit: Payer: Self-pay | Admitting: Pulmonary Disease

## 2014-01-10 MED ORDER — GENTAMICIN SULFATE 40 MG/ML IJ SOLN
510.0000 mg | INTRAMUSCULAR | Status: AC
  Administered 2014-01-11: 510 mg via INTRAVENOUS
  Filled 2014-01-10: qty 12.75

## 2014-01-10 MED ORDER — GENTAMICIN SULFATE 40 MG/ML IJ SOLN
490.0000 mg | INTRAVENOUS | Status: DC
  Filled 2014-01-10: qty 12.25

## 2014-01-11 ENCOUNTER — Ambulatory Visit (HOSPITAL_COMMUNITY): Payer: Medicare HMO | Admitting: Anesthesiology

## 2014-01-11 ENCOUNTER — Encounter (HOSPITAL_COMMUNITY): Payer: Medicare HMO | Admitting: Anesthesiology

## 2014-01-11 ENCOUNTER — Encounter (HOSPITAL_COMMUNITY): Payer: Self-pay | Admitting: *Deleted

## 2014-01-11 ENCOUNTER — Encounter (HOSPITAL_COMMUNITY)
Admission: RE | Disposition: A | Discharge status: Home / Self Care | Payer: Self-pay | Source: Ambulatory Visit | Attending: Urology

## 2014-01-11 ENCOUNTER — Ambulatory Visit (HOSPITAL_COMMUNITY)
Admission: RE | Admit: 2014-01-11 | Discharge: 2014-01-11 | Disposition: A | Discharge status: Home / Self Care | Payer: Medicare HMO | Source: Ambulatory Visit | Attending: Urology | Admitting: Urology

## 2014-01-11 DIAGNOSIS — E785 Hyperlipidemia, unspecified: Secondary | ICD-10-CM | POA: Insufficient documentation

## 2014-01-11 DIAGNOSIS — C679 Malignant neoplasm of bladder, unspecified: Secondary | ICD-10-CM | POA: Insufficient documentation

## 2014-01-11 DIAGNOSIS — Q5564 Hidden penis: Secondary | ICD-10-CM | POA: Insufficient documentation

## 2014-01-11 DIAGNOSIS — R310 Gross hematuria: Secondary | ICD-10-CM | POA: Insufficient documentation

## 2014-01-11 DIAGNOSIS — Z950 Presence of cardiac pacemaker: Secondary | ICD-10-CM | POA: Insufficient documentation

## 2014-01-11 DIAGNOSIS — Z87891 Personal history of nicotine dependence: Secondary | ICD-10-CM | POA: Insufficient documentation

## 2014-01-11 DIAGNOSIS — J449 Chronic obstructive pulmonary disease, unspecified: Secondary | ICD-10-CM | POA: Insufficient documentation

## 2014-01-11 DIAGNOSIS — I2510 Atherosclerotic heart disease of native coronary artery without angina pectoris: Secondary | ICD-10-CM | POA: Insufficient documentation

## 2014-01-11 DIAGNOSIS — Z87440 Personal history of urinary (tract) infections: Secondary | ICD-10-CM | POA: Insufficient documentation

## 2014-01-11 DIAGNOSIS — E119 Type 2 diabetes mellitus without complications: Secondary | ICD-10-CM | POA: Insufficient documentation

## 2014-01-11 DIAGNOSIS — I10 Essential (primary) hypertension: Secondary | ICD-10-CM | POA: Insufficient documentation

## 2014-01-11 DIAGNOSIS — E039 Hypothyroidism, unspecified: Secondary | ICD-10-CM | POA: Insufficient documentation

## 2014-01-11 DIAGNOSIS — Z87442 Personal history of urinary calculi: Secondary | ICD-10-CM | POA: Insufficient documentation

## 2014-01-11 DIAGNOSIS — N3080 Other cystitis without hematuria: Secondary | ICD-10-CM | POA: Insufficient documentation

## 2014-01-11 DIAGNOSIS — C669 Malignant neoplasm of unspecified ureter: Secondary | ICD-10-CM | POA: Insufficient documentation

## 2014-01-11 DIAGNOSIS — Z86718 Personal history of other venous thrombosis and embolism: Secondary | ICD-10-CM | POA: Insufficient documentation

## 2014-01-11 DIAGNOSIS — Z7901 Long term (current) use of anticoagulants: Secondary | ICD-10-CM | POA: Insufficient documentation

## 2014-01-11 DIAGNOSIS — Z9089 Acquired absence of other organs: Secondary | ICD-10-CM | POA: Insufficient documentation

## 2014-01-11 DIAGNOSIS — G4733 Obstructive sleep apnea (adult) (pediatric): Secondary | ICD-10-CM | POA: Insufficient documentation

## 2014-01-11 DIAGNOSIS — J4489 Other specified chronic obstructive pulmonary disease: Secondary | ICD-10-CM | POA: Insufficient documentation

## 2014-01-11 DIAGNOSIS — K219 Gastro-esophageal reflux disease without esophagitis: Secondary | ICD-10-CM | POA: Insufficient documentation

## 2014-01-11 HISTORY — PX: HOLMIUM LASER APPLICATION: SHX5852

## 2014-01-11 HISTORY — PX: TRANSURETHRAL RESECTION OF BLADDER TUMOR WITH GYRUS (TURBT-GYRUS): SHX6458

## 2014-01-11 HISTORY — PX: CYSTOSCOPY WITH URETEROSCOPY AND STENT PLACEMENT: SHX6377

## 2014-01-11 LAB — GLUCOSE, CAPILLARY
GLUCOSE-CAPILLARY: 267 mg/dL — AB (ref 70–99)
Glucose-Capillary: 287 mg/dL — ABNORMAL HIGH (ref 70–99)
Glucose-Capillary: 290 mg/dL — ABNORMAL HIGH (ref 70–99)

## 2014-01-11 LAB — PROTIME-INR
INR: 1.04 (ref 0.00–1.49)
Prothrombin Time: 13.4 seconds (ref 11.6–15.2)

## 2014-01-11 SURGERY — TRANSURETHRAL RESECTION OF BLADDER TUMOR WITH GYRUS (TURBT-GYRUS)
Anesthesia: General | Laterality: Right

## 2014-01-11 MED ORDER — PROPOFOL 10 MG/ML IV BOLUS
INTRAVENOUS | Status: DC | PRN
  Administered 2014-01-11: 200 mg via INTRAVENOUS

## 2014-01-11 MED ORDER — INSULIN ASPART 100 UNIT/ML ~~LOC~~ SOLN
SUBCUTANEOUS | Status: AC
  Filled 2014-01-11: qty 1

## 2014-01-11 MED ORDER — ONDANSETRON HCL 4 MG/2ML IJ SOLN
INTRAMUSCULAR | Status: AC
  Filled 2014-01-11: qty 2

## 2014-01-11 MED ORDER — SUCCINYLCHOLINE CHLORIDE 20 MG/ML IJ SOLN
INTRAMUSCULAR | Status: DC | PRN
  Administered 2014-01-11: 100 mg via INTRAVENOUS

## 2014-01-11 MED ORDER — PHENYLEPHRINE HCL 10 MG/ML IJ SOLN
INTRAMUSCULAR | Status: DC | PRN
  Administered 2014-01-11: 120 ug via INTRAVENOUS
  Administered 2014-01-11: 160 ug via INTRAVENOUS

## 2014-01-11 MED ORDER — MEPERIDINE HCL 50 MG/ML IJ SOLN: 6.2500 mg | INTRAMUSCULAR | Status: DC | PRN

## 2014-01-11 MED ORDER — NEOSTIGMINE METHYLSULFATE 1 MG/ML IJ SOLN
INTRAMUSCULAR | Status: DC | PRN
  Administered 2014-01-11: 5 mg via INTRAVENOUS

## 2014-01-11 MED ORDER — SODIUM CHLORIDE 0.9 % IV SOLN: INTRAVENOUS | Status: DC

## 2014-01-11 MED ORDER — INSULIN ASPART 100 UNIT/ML ~~LOC~~ SOLN
5.0000 [IU] | Freq: Once | SUBCUTANEOUS | Status: AC
  Administered 2014-01-11: 5 [IU] via SUBCUTANEOUS

## 2014-01-11 MED ORDER — FENTANYL CITRATE 0.05 MG/ML IJ SOLN
INTRAMUSCULAR | Status: DC | PRN
  Administered 2014-01-11: 50 ug via INTRAVENOUS

## 2014-01-11 MED ORDER — BACITRACIN-NEOMYCIN-POLYMYXIN OINTMENT TUBE
TOPICAL_OINTMENT | CUTANEOUS | Status: DC | PRN
  Administered 2014-01-11: 1 via TOPICAL

## 2014-01-11 MED ORDER — OXYCODONE HCL 5 MG/5ML PO SOLN: 5.0000 mg | Freq: Once | ORAL | Status: DC | PRN

## 2014-01-11 MED ORDER — TRAMADOL HCL 50 MG PO TABS: 100.0000 mg | ORAL_TABLET | Freq: Four times a day (QID) | ORAL | Status: DC | PRN

## 2014-01-11 MED ORDER — FENTANYL CITRATE 0.05 MG/ML IJ SOLN
INTRAMUSCULAR | Status: AC
  Filled 2014-01-11: qty 2

## 2014-01-11 MED ORDER — PROMETHAZINE HCL 25 MG/ML IJ SOLN: 6.2500 mg | INTRAMUSCULAR | Status: DC | PRN

## 2014-01-11 MED ORDER — LACTATED RINGERS IV SOLN
INTRAVENOUS | Status: DC | PRN
  Administered 2014-01-11: 12:00:00 via INTRAVENOUS

## 2014-01-11 MED ORDER — SODIUM CHLORIDE 0.9 % IR SOLN
Status: DC | PRN
  Administered 2014-01-11: 4000 mL via INTRAVESICAL

## 2014-01-11 MED ORDER — OXYCODONE HCL 5 MG PO TABS
5.0000 mg | ORAL_TABLET | Freq: Once | ORAL | Status: DC | PRN
Start: 2014-01-11 — End: 2014-01-11

## 2014-01-11 MED ORDER — ONDANSETRON HCL 4 MG/2ML IJ SOLN
INTRAMUSCULAR | Status: DC | PRN
  Administered 2014-01-11: 4 mg via INTRAVENOUS

## 2014-01-11 MED ORDER — SODIUM CHLORIDE 0.9 % IV SOLN
INTRAVENOUS | Status: DC | PRN
  Administered 2014-01-11: 13:00:00 via INTRAVENOUS

## 2014-01-11 MED ORDER — CISATRACURIUM BESYLATE 20 MG/10ML IV SOLN
INTRAVENOUS | Status: AC
  Filled 2014-01-11: qty 10

## 2014-01-11 MED ORDER — PHENYLEPHRINE 40 MCG/ML (10ML) SYRINGE FOR IV PUSH (FOR BLOOD PRESSURE SUPPORT)
PREFILLED_SYRINGE | INTRAVENOUS | Status: AC
  Filled 2014-01-11: qty 10

## 2014-01-11 MED ORDER — EPHEDRINE SULFATE 50 MG/ML IJ SOLN
INTRAMUSCULAR | Status: AC
  Filled 2014-01-11: qty 1

## 2014-01-11 MED ORDER — HYDROMORPHONE HCL PF 1 MG/ML IJ SOLN: 0.2500 mg | INTRAMUSCULAR | Status: DC | PRN

## 2014-01-11 MED ORDER — GLYCOPYRROLATE 0.2 MG/ML IJ SOLN
INTRAMUSCULAR | Status: DC | PRN
  Administered 2014-01-11: .8 mg via INTRAVENOUS

## 2014-01-11 MED ORDER — PROPOFOL 10 MG/ML IV BOLUS
INTRAVENOUS | Status: AC
  Filled 2014-01-11: qty 20

## 2014-01-11 MED ORDER — CISATRACURIUM BESYLATE (PF) 10 MG/5ML IV SOLN
INTRAVENOUS | Status: DC | PRN
  Administered 2014-01-11: 6 mg via INTRAVENOUS

## 2014-01-11 MED ORDER — BACITRACIN-NEOMYCIN-POLYMYXIN 400-5-5000 EX OINT
TOPICAL_OINTMENT | CUTANEOUS | Status: AC
  Filled 2014-01-11: qty 1

## 2014-01-11 MED ORDER — GLYCOPYRROLATE 0.2 MG/ML IJ SOLN
INTRAMUSCULAR | Status: AC
  Filled 2014-01-11: qty 4

## 2014-01-11 MED ORDER — SODIUM CHLORIDE 0.9 % IJ SOLN
INTRAMUSCULAR | Status: AC
  Filled 2014-01-11: qty 10

## 2014-01-11 MED ORDER — IOHEXOL 300 MG/ML  SOLN
INTRAMUSCULAR | Status: DC | PRN
  Administered 2014-01-11: 20 mL

## 2014-01-11 SURGICAL SUPPLY — 29 items
BAG URINE DRAINAGE (UROLOGICAL SUPPLIES) IMPLANT
BAG URO CATCHER STRL LF (DRAPE) ×3 IMPLANT
BASKET LASER NITINOL 1.9FR (BASKET) IMPLANT
CATH FOLEY 3WAY 30CC 22FR (CATHETERS) IMPLANT
CATH INTERMIT  6FR 70CM (CATHETERS) ×3 IMPLANT
CLOTH BEACON ORANGE TIMEOUT ST (SAFETY) ×3 IMPLANT
DRAPE CAMERA CLOSED 9X96 (DRAPES) ×3 IMPLANT
ELECT BUTTON HF 24-28F 2 30DE (ELECTRODE) IMPLANT
ELECT LOOP MED HF 24F 12D (CUTTING LOOP) IMPLANT
ELECT LOOP MED HF 24F 12D CBL (CLIP) ×3 IMPLANT
ELECT RESECT VAPORIZE 12D CBL (ELECTRODE) IMPLANT
FIBER LASER FLEXIVA 200 (UROLOGICAL SUPPLIES) ×3 IMPLANT
GLOVE BIOGEL M STRL SZ7.5 (GLOVE) ×3 IMPLANT
GOWN STRL REUS W/TWL LRG LVL3 (GOWN DISPOSABLE) ×3 IMPLANT
GOWN STRL REUS W/TWL XL LVL3 (GOWN DISPOSABLE) ×3 IMPLANT
GUIDEWIRE ANG ZIPWIRE 038X150 (WIRE) ×3 IMPLANT
GUIDEWIRE STR DUAL SENSOR (WIRE) ×3 IMPLANT
HOLDER FOLEY CATH W/STRAP (MISCELLANEOUS) IMPLANT
IV NS IRRIG 3000ML ARTHROMATIC (IV SOLUTION) IMPLANT
KIT ASPIRATION TUBING (SET/KITS/TRAYS/PACK) IMPLANT
MANIFOLD NEPTUNE II (INSTRUMENTS) ×3 IMPLANT
PACK CYSTO (CUSTOM PROCEDURE TRAY) ×3 IMPLANT
PLUG CATH AND CAP STER (CATHETERS) IMPLANT
STENT CONTOUR 6FRX26X.038 (STENTS) ×3 IMPLANT
SUT ETHILON 3 0 PS 1 (SUTURE) IMPLANT
SYR 30ML LL (SYRINGE) IMPLANT
SYRINGE IRR TOOMEY STRL 70CC (SYRINGE) IMPLANT
TUBE FEEDING 8FR 16IN STR KANG (MISCELLANEOUS) ×3 IMPLANT
TUBING CONNECTING 10 (TUBING) ×3 IMPLANT

## 2014-01-11 NOTE — Progress Notes (Signed)
EKG readings on monitor- NORMAL SINUS RHYTHMN

## 2014-01-11 NOTE — H&P (Signed)
[name] is an 73 y.o. male.    Chief Complaint: Pre-op Restaging Transurethral Resection of Bladder Tumor  HPI:   1 - Recurrent UTI - Pt wtih UTI about every other year, includign 09/2013. UCX's e. coli and streptoccus. One pyelo episode in 2008. He is diabetic with significant glycosuria.   2 - Nephrolithiasis - Two prior episodes of colic managed medically around 1980 and 1990.  Imagign 2014 without sig stones.   3 - Prostate Screening - No FHX prostate cancer. PSA 2014 0.62 (at pt age 7), DRE 60gm smooth.  4 - Buried Penis - Pt voids seated as unable to find penis routienly 2/2 morbid obesity. No phimosis.  5 - Gross Hematuria - Pt with new gross hematuria 09/2013 prompting ER CT which revealed massive bladder / ureteral cancer.  6 - Bladder + Rt Ureteral Cancer -  10/2013 - TURBT + Rt stent - pTaG1 massive tumor with visible involvment Rt distal ureter which was partially resected intramurally.  CT with left ext. iliac node 1.5cm.   PMH sig for morbid obesity, DM (A1C >8, with LE neuropathy), OSA, AFib/DVT/Coumadin, LLE arterial thrombosis / thrombectomy,   Today Bon is seen to proceed with bladder cancer re-resection of his very large, low grade rt sided tumor involving distal ureter. His last TURBT was complicated by CHF exacerbation requiring 3 day admission for diuresis.   Most recent UCX negative, he has had lovenox bridge.  Past Medical History  Diagnosis Date  . Diabetes mellitus   . COPD (chronic obstructive pulmonary disease)     CPAP  . Renal insufficiency   . Hyperlipemia   . Other and unspecified coagulation defects   . Hypothyroidism   . Hyperkalemia   . 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 Sick sinus syndrome     s/p PPM by JA  . Pancreatitis   . Obesity   . Constipation   . OSA on CPAP     using CPAP although it is hard for him to tolerate  . SOB (shortness of breath)   . CAD (coronary artery disease)     not much  . Anginal pain      at times  . Hypertension     occasional  . Pacemaker   . Depression     recently  . Anxiety   . H/O Legionnaire's disease 2003  . History of blood clots     R groin  . Peripheral neuropathy   . Osteoarthritis     fingers  . GERD (gastroesophageal reflux disease)   . Anemia     hx of  . Pneumonia 2003  . Cancer 2014    bladder cancer AND RIGHT URETERAL CANCER  . CHF (congestive heart failure)     Past Surgical History  Procedure Laterality Date  . Nasal septum surgery  1967  . Pacemaker placement  06/2010    [facility] Accent RF DR, Model [id number] ( Serial number [id number])  . Thromboembolectomy and four compartment fasciotomy  2009    GROIN AREA  . Cardiac catheterization    . Cataract extraction Right   . Cholecystectomy  2012  . Insert / replace / remove pacemaker  2011  . Transurethral resection of bladder tumor with gyrus (turbt-gyrus) N/A 10/26/2013    Procedure: TRANSURETHRAL RESECTION OF BLADDER TUMOR WITH GYRUS (TURBT-GYRUS);  Surgeon: Alexis Frock, MD;  Location: [facility];  Service: Urology;  Laterality: N/A;  .  Cystoscopy w/ ureteral stent placement Right 10/26/2013    Procedure: CYSTOSCOPY WITH RETROGRADE PYELOGRAM/URETERAL STENT PLACEMENT;  Surgeon: Alexis Frock, MD;  Location: [facility];  Service: Urology;  Laterality: Right;  . Embolectomy Left 11/02/2013    Procedure: LEFT FEMORAL EMBOLECTOMY, LEFT FEMORAL ARTERY ENDARTERECTOMY WITH DACRON PATCH ANGIOPLASTY.;  Surgeon: Mal Misty, MD;  Location: [facility];  Service: Vascular;  Laterality: Left;    Family History  Problem Relation Age of Onset  . Liver cancer Mother     deceased age 86  . Cancer Mother     liver cancer  . Colon cancer Neg Hx   . Heart attack Father    Social History:  reports that he quit smoking about 7 years ago. His smoking use included Cigarettes. He has a 100 pack-year smoking history. He has never used smokeless tobacco. He reports that he does not drink alcohol or use illicit  drugs.  Allergies:  Allergies  Allergen Reactions  . Lactose Intolerance (Gi)     Gi upset  . Pioglitazone     ACTOS  REACTION: Edema    No prescriptions prior to admission    No results found for this or any previous visit (from the past 48 hour(s)). No results found.  Review of Systems  Constitutional: Negative.  Negative for fever and chills.  HENT: Negative.   Eyes: Negative.   Respiratory: Negative.   Cardiovascular: Negative.   Gastrointestinal: Negative.   Genitourinary: Negative.   Musculoskeletal: Negative.   Skin: Negative.   Neurological: Negative.   Endo/Heme/Allergies: Negative.   Psychiatric/Behavioral: Negative.     There were no vitals taken for this visit. Physical Exam  Constitutional: He appears well-developed and well-nourished.  Stigmata of chronic illness  HENT:  Head: Normocephalic and atraumatic.  Eyes: Pupils are equal, round, and reactive to light.  Neck: Normal range of motion. Neck supple.  Cardiovascular: Normal rate.   Respiratory: Effort normal.  GI: Soft.  Morbid truncal obesity  Genitourinary:  Hidden penis  Musculoskeletal: Normal range of motion.  Neurological: He is alert.  Skin: Skin is warm.  LE venous stasis changes  Psychiatric: He has a normal mood and affect. His behavior is normal. Judgment and thought content normal.     Assessment/Plan  1 - Recurrent UTI - I again explained that his diabetes is likely his biggest risk factor for recurrent infections given sig glycosuria. No hydro or stones by CT.   2 - Nephrolithiasis - stone free by CT 2014.  3 - Prostate Screening - up to date this year. At age 67 and with his level of comorbidity I would NOT recomend future screening.  4 - Buried Penis - not interfearing wtih voiding, reinforced need for good hygein, which he is maintianing.   5 - Gross Hematuria - Eval wtih cysto, CT reveals bladder cancer as per below.  6 - Bladder Cancer - Huge volume of low-grade  tumor now removed, but unable to completely address rt distal ureteral portion at time of initial resection. As his tumor was low-grade and he is very poor operative candidate for major surgery I feel aggressive endoscopic management with goal of maximally debulking tumor and performing periodic surveillance best plan.   Will plan for repeat resection with emphasis on rt ureteroscopy / biopsy / likely laser ablation of distal rt ureter mucosal lesions today.  I also reexplained that he may be dependant on Rt ureteral stents permanantly given his location of tumor at area of Rt UO,  but only time will tell.  We rediscussed that endoscopic management of upper tract transitional cell carcinoma is typically best reserved for cases of high grade tumor and substantial comorbidity and low-grade tumors as it is clearly oncologically inferior to nephroureterectomy. We rediscussed goals of therapy including resection / ablation of all visible lesions with concomitant topical chemotherapy (mitomycin) if appears no evidence of gross urothelial violation in effort to maximally treat and prevent recurrence. We rediscussed risks including bleeding, infection, damage to kidney / ureter  bladder, rarely loss of kidney. We rediscussed anesthetic risks and rare but serious surgical complications including DVT, PE, MI, and mortality. We specifically readdressed that in 5-10% of cases a staged approach is required with stenting followed by re-attempt ureteroscopy if anatomy unfavorable. The patient voiced understanding and wises to proceed. Will also resect any visible bladder tumor as well.    Shavana Calder 01/11/2014, 6:52 AM

## 2014-01-11 NOTE — Anesthesia Preprocedure Evaluation (Addendum)
Anesthesia Evaluation  Patient identified by MRN, date of birth, ID band Patient awake    Reviewed: Allergy & Precautions, H&P , NPO status , Patient's Chart, lab work & pertinent test results  Airway Mallampati: III TM Distance: >3 FB Neck ROM: Full    Dental no notable dental hx. (+) Dental Advisory Given   Pulmonary shortness of breath and with exertion, sleep apnea , pneumonia -, COPDformer smoker,  breath sounds clear to auscultation  Pulmonary exam normal       Cardiovascular hypertension, Pt. on medications + angina + CAD, + Peripheral Vascular Disease and +CHF + dysrhythmias Atrial Fibrillation + pacemaker Rhythm:Regular Rate:Normal     Neuro/Psych PSYCHIATRIC DISORDERS Anxiety Depression    GI/Hepatic Neg liver ROS, GERD-  Medicated and Controlled,  Endo/Other  diabetes, Type 2, Insulin DependentHypothyroidism Morbid obesity  Renal/GU Renal InsufficiencyRenal disease     Musculoskeletal negative musculoskeletal ROS (+)   Abdominal   Peds  Hematology  (+) Blood dyscrasia, anemia ,   Anesthesia Other Findings   Reproductive/Obstetrics negative OB ROS                        Anesthesia Physical  Anesthesia Plan  ASA: IV  Anesthesia Plan: General   Post-op Pain Management:    Induction: Intravenous  Airway Management Planned: LMA  Additional Equipment:   Intra-op Plan:   Post-operative Plan: Extubation in OR  Informed Consent: I have reviewed the patients History and Physical, chart, labs and discussed the procedure including the risks, benefits and alternatives for the proposed anesthesia with the patient or authorized representative who has indicated his/her understanding and acceptance.   Dental advisory given  Plan Discussed with: CRNA  Anesthesia Plan Comments:        Anesthesia Quick Evaluation

## 2014-01-11 NOTE — Progress Notes (Signed)
Blood sugar of 267 called to dr Lissa Hoard  anesth  No new orders

## 2014-01-11 NOTE — Progress Notes (Signed)
[place] Rep- in to interrogate pacemaker  And adjust settings.

## 2014-01-11 NOTE — Progress Notes (Signed)
Several 12 lead EKGS RUN- per Dr. Lissa Hoard- saw copies- [place] called.

## 2014-01-11 NOTE — Progress Notes (Signed)
Dr. Lissa Hoard in to see patient - made aware of patient's heart rate being as high as 116 at times- patient is paced

## 2014-01-11 NOTE — Progress Notes (Signed)
Dr. Lissa Hoard aware of [employer] REP findings and setting adjustments of pacemaker- O.K. To go to Short Stay for discharge.

## 2014-01-11 NOTE — Progress Notes (Signed)
Dr. Lissa Hoard states no need for [facility]. [facility]  REP not necessary to see patient in PACU- no interrogation needed at this time.

## 2014-01-11 NOTE — Progress Notes (Signed)
Dr. Lissa Hoard made aware of patient's CBG results in PACU- 287- 5 units of Novolog Insulin  Subcutaneous  Given as ordered.

## 2014-01-11 NOTE — Transfer of Care (Signed)
Immediate Anesthesia Transfer of Care Note  Patient: [name]  Procedure(s) Performed: Procedure(s): TRANSURETHRAL RESECTION OF BLADDER TUMOR WITH GYRUS (TURBT-GYRUS) (N/A) CYSTOSCOPY WITH URETEROSCOPY ,RIGHT RETROGRADE AND STENT CHANGE AND LASER OF URETERAL TUMOR (Right) HOLMIUM LASER APPLICATION (Right)  Patient Location: PACU  Anesthesia Type:General  Level of Consciousness: awake, alert , sedated and patient cooperative  Airway & Oxygen Therapy: Patient Spontanous Breathing and Patient connected to face mask oxygen  Post-op Assessment: Report given to PACU RN and Post -op Vital signs reviewed and stable  Post vital signs: Reviewed and stable  Complications: No apparent anesthesia complications

## 2014-01-11 NOTE — Brief Op Note (Signed)
01/11/2014  1:40 PM  PATIENT:  [name]  73 y.o. male  PRE-OPERATIVE DIAGNOSIS:  BLADDER AND URETERAL CANCER  POST-OPERATIVE DIAGNOSIS:  BLADDER AND URETERAL CANCER  PROCEDURE:  Procedure(s): TRANSURETHRAL RESECTION OF BLADDER TUMOR WITH GYRUS (TURBT-GYRUS) (N/A) CYSTOSCOPY WITH URETEROSCOPY ,RIGHT RETROGRADE AND STENT CHANGE AND LASER OF URETERAL TUMOR (Right) HOLMIUM LASER APPLICATION (Right)  SURGEON:  Surgeon(s) and Role:    * Alexis Frock, MD - Primary  PHYSICIAN ASSISTANT:   ASSISTANTS: none   ANESTHESIA:   general  EBL:  Total I/O In: 100 [I.V.:100] Out: -   BLOOD ADMINISTERED:none  DRAINS: none   LOCAL MEDICATIONS USED:  NONE  SPECIMEN:  Source of Specimen:  1 - Old resection site  DISPOSITION OF SPECIMEN:  PATHOLOGY  COUNTS:  YES  TOURNIQUET:  * No tourniquets in log *  DICTATION: .Other Dictation: Dictation Number [id number]  PLAN OF CARE: Discharge to home after PACU  PATIENT DISPOSITION:  PACU - hemodynamically stable.   Delay start of Pharmacological VTE agent (>24hrs) due to surgical blood loss or risk of bleeding: no

## 2014-01-11 NOTE — Discharge Instructions (Signed)
1 - You may have urinary urgency (bladder spasms) and bloody urine on / off with stent in place. This is normal. ° °2 - Call MD or go to ER for fever >102, severe pain / nausea / vomiting not relieved by medications, or acute change in medical status ° °

## 2014-01-11 NOTE — Anesthesia Postprocedure Evaluation (Signed)
Anesthesia Post Note  Patient: [name]  Procedure(s) Performed: Procedure(s) (LRB): TRANSURETHRAL RESECTION OF BLADDER TUMOR WITH GYRUS (TURBT-GYRUS) (N/A) CYSTOSCOPY WITH URETEROSCOPY ,RIGHT RETROGRADE AND STENT CHANGE AND LASER OF URETERAL TUMOR (Right) HOLMIUM LASER APPLICATION (Right)  Anesthesia type: General  Patient location: PACU  Post pain: Pain level controlled  Post assessment: Post-op Vital signs reviewed  Last Vitals: BP 132/64  Pulse 60  Temp(Src) 37.2 C (Oral)  Resp 16  SpO2 99%  Post vital signs: Reviewed  Level of consciousness: sedated  Complications: No apparent anesthesia complications

## 2014-01-12 ENCOUNTER — Encounter (HOSPITAL_COMMUNITY): Payer: Self-pay | Admitting: Urology

## 2014-01-12 NOTE — Op Note (Signed)
NAMETANIELA, FELTUS NO.:  [id number]  MEDICAL RECORD NO.:  [id number]  LOCATION:  [facility]                         FACILITY:  [facility]  PHYSICIAN:  Alexis Frock, MD     DATE OF BIRTH:  [birth year]  DATE OF PROCEDURE:  01/11/2014 DATE OF DISCHARGE:  01/11/2014                              OPERATIVE REPORT   DIAGNOSIS:  Right distal ureteral and bladder tumor, low-grade, large volume.  PROCEDURES: 1. Transitional bladder tumor volume, medium. 2. Cystoscopy with right retrograde pyelogram interpretation. 3. Exchange of right ureteral stent, 6 x 26, no tether. 4. Right ureteroscopy with laser ablation of tumor.  FINDINGS: 1. Very small amount of papillary tissue,  irritation versus     recurrent tumor in the old resection bed in the urinary bladder at     the inter-trigone area. 2. Papillary tissue emanating from right ureteral orifice, consistent     with known distal right ureteral tumor. 3. Extensive right ureteral tumor to approximately 2 cm above the     ureteral orifice.  SPECIMEN:  Old resection site for permanent pathology.  INDICATION:  Mr. Raffield is a morbidly obese gentleman with history of hematuria.  He was found on workup of this to have large volume of low- grade papillary bladder tumor, also with tumor apparently involving his right distal ureter.  He underwent initial transurethral resection approximately 8 weeks ago, at which point the majority of his bladder tumor was managed.  It appeared that he had involvement of the distal ureter, however, before this was managed, was confirmed the tumor histology which later did confirm fortunately low-grade tumor that was very large volume.  Given large volume of tumor and right distal ureteral involvement, various options including a re-resection with right ureteroscopy and ablation of known right distal ureteral tumor, and he wished to proceed.  Informed consent was obtained and placed in the medical  record.  PROCEDURE IN DETAIL:  The patient being [name] was verified, the procedure being transitional bladder tumor and right ureteroscopy tumor ablation were confirmed.  Procedure was carried out.  Time-out was performed.  Intravenous antibiotics were administered.  General endotracheal anesthesia was introduced.  The patient was placed into a low lithotomy position.  A sterile field was created by prepping and draping the patient's penis, perineum, proximal thighs using iodine x3. 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  Next, cystourethroscopy was performed using a 22-French rigid cystoscope with a 12-degree and 7-degree offset lens.  Inspection of anterior and posterior urethra unremarkable.  Inspection of urinary bladder revealed some small amount of papillary tissue in the inter- trigonal area and just slightly lateral to the right ureteral orifice at the patient's old resection site, concerning for persistent tumor versus edema from right ureteral stent which was seen in situ in excellent position.  There was clearly papillary tissue consistent with tumor emanating from the right ureteral orifice as per before.  Photo documentation was performed.  Next, the distal end of the right ureteral stent was grasped and the sent was brought out in its entirety.  The right ureteral orifice was then cannulated with a 6-French  end-hole catheter and right retrograde pyelogram was obtained.  Right retrograde pyelogram demonstrates a single right ureter and single system right kidney.  No filling defects or narrowing were noted.  0.038 Glidewire was once again advanced at the level of the upper pole and set aside as a safety wire.  Next, semi-rigid ureteroscopy was performed to entire length of the right ureter alongside a separate Sensor working wire.  There was circumferential papillary tumor involving the distal 2 cm of the right ureter with very mild  ureteronephrosis above this and no other tumor identified.  As the goal was to remove all visible tumor, the decision was made to proceed with laser ablation of the distal tumor as such a 200 nanometer holmium laser fiber was used and ablation was performed down to the submucosal level and the distal 2 cm of the ureter completely ablating all visible papillary tissue.  This resulted in excellent widely patent ureter without evidence of perforation.  Clearly repeat stenting would be warranted given the purposeful mucosalization distally.  As such, a new 6 x 26 double-J stent was placed over the remaining safety wire.  Good proximal and distal curl were noted. Attention was then directed at the re-resection of old resection site using the 26-French continuous flow resectoscope sheath and the resectoscope loop.  Very careful and systematic resection was performed of the old resection site in the inter-trigonal area, thus taking this papillary tissue down to what appeared to be the seromuscular fibers of the bladder.  Great care was taken to avoid bladder perforation and this did not occur.  These fragments were set aside, labeled old resection site.  Final inspection of urinary bladder revealed excellent hemostasis.  No evidence of perforation.  No evidence of residual gross papillary tumor.  Bladder was emptied per cystoscope.  The procedure was then terminated.  The patient tolerated the procedure well.  There were no immediate periprocedural complications.  The patient was taken to the postanesthesia care unit in stable condition.          ______________________________ Alexis Frock, MD     TM/MEDQ  D:  01/11/2014  T:  01/12/2014  Job:  [id number]

## 2014-01-13 ENCOUNTER — Telehealth: Payer: Self-pay

## 2014-01-13 DIAGNOSIS — C679 Malignant neoplasm of bladder, unspecified: Secondary | ICD-10-CM

## 2014-01-13 NOTE — Telephone Encounter (Signed)
done

## 2014-01-13 NOTE — Telephone Encounter (Signed)
Pt called requesting that a referral for Urology with Dr. Tresa Moore to be put in. Pt has appointment on 01/24/2013.  Thanks!

## 2014-01-17 ENCOUNTER — Ambulatory Visit (INDEPENDENT_AMBULATORY_CARE_PROVIDER_SITE_OTHER): Payer: Medicare HMO | Admitting: General Practice

## 2014-01-17 DIAGNOSIS — I743 Embolism and thrombosis of arteries of the lower extremities: Secondary | ICD-10-CM

## 2014-01-17 LAB — POCT INR: INR: 1.7

## 2014-01-17 NOTE — Progress Notes (Signed)
Pre-visit discussion using our clinic review tool. No additional management support is needed unless otherwise documented below in the visit note.  

## 2014-01-19 ENCOUNTER — Encounter: Payer: Self-pay | Admitting: Internal Medicine

## 2014-01-23 ENCOUNTER — Other Ambulatory Visit: Payer: Self-pay

## 2014-01-25 ENCOUNTER — Other Ambulatory Visit: Payer: Self-pay

## 2014-01-25 MED ORDER — WARFARIN SODIUM 4 MG PO TABS: ORAL_TABLET | ORAL | Status: DC

## 2014-02-10 ENCOUNTER — Other Ambulatory Visit: Payer: Self-pay | Admitting: Endocrinology

## 2014-02-10 ENCOUNTER — Ambulatory Visit (INDEPENDENT_AMBULATORY_CARE_PROVIDER_SITE_OTHER): Payer: Medicare HMO | Admitting: Endocrinology

## 2014-02-10 ENCOUNTER — Encounter: Payer: Self-pay | Admitting: Endocrinology

## 2014-02-10 VITALS — BP 118/70 | HR 78 | Temp 98.3°F | Ht 70.0 in | Wt 317.0 lb

## 2014-02-10 DIAGNOSIS — E1029 Type 1 diabetes mellitus with other diabetic kidney complication: Secondary | ICD-10-CM

## 2014-02-10 DIAGNOSIS — E1065 Type 1 diabetes mellitus with hyperglycemia: Principal | ICD-10-CM

## 2014-02-10 LAB — HEMOGLOBIN A1C: Hgb A1c MFr Bld: 8.6 % — ABNORMAL HIGH (ref 4.6–6.5)

## 2014-02-10 MED ORDER — WARFARIN SODIUM 4 MG PO TABS: ORAL_TABLET | ORAL | Status: DC

## 2014-02-10 MED ORDER — ROSUVASTATIN CALCIUM 40 MG PO TABS: 20.0000 mg | ORAL_TABLET | Freq: Every evening | ORAL | Status: DC

## 2014-02-10 NOTE — Patient Instructions (Signed)
blood tests are being requested for you today.  We'll contact you with results.    check your blood sugar twice a day.  vary the time of day when you check, between before the 3 meals, and at bedtime.  also check if you have symptoms of your blood sugar being too high or too low.  please keep a record of the readings and bring it to your next appointment here.  You can write it on any piece of paper.  please call us sooner if your blood sugar goes below 70, or if you have a lot of readings over 200.   Please come back for a follow-up appointment in 3 months.    

## 2014-02-10 NOTE — Progress Notes (Signed)
Subjective:    Patient ID: [name], male    DOB: 06/11/1941, 73 y.o.   MRN: [id number]  HPI Pt returns for f/u of insulin-requiring DM (dx'ed 1988, when he presented with polyuria; he has has been on insulin since 1989; he has mild if any neuropathy of the lower extremities, but he has associated renal insufficiency and CAD; characterized by severe insulin resistance; he has never had severe hypoglycemia or DKA; he takes multiple daily injections of U-500).  he brings a record of his cbg's which i have reviewed today.  It varies from 49-200's.  There is no trend throughout the day, except that he has mild hypoglycemia approx every 2 weeks, usually in the early hrs of the morning.   Past Medical History  Diagnosis Date  . Diabetes mellitus   . COPD (chronic obstructive pulmonary disease)     CPAP  . Renal insufficiency   . Hyperlipemia   . Other and unspecified coagulation defects   . Hypothyroidism   . Hyperkalemia   . 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 Sick sinus syndrome     s/p PPM by JA  . Pancreatitis   . Obesity   . Constipation   . OSA on CPAP     using CPAP although it is hard for him to tolerate  . SOB (shortness of breath)   . CAD (coronary artery disease)     not much  . Anginal pain     at times  . Hypertension     occasional  . Pacemaker   . Depression     recently  . Anxiety   . H/O Legionnaire's disease 2003  . History of blood clots     R groin  . Peripheral neuropathy   . Osteoarthritis     fingers  . GERD (gastroesophageal reflux disease)   . Anemia     hx of  . Pneumonia 2003  . Cancer 2014    bladder cancer AND RIGHT URETERAL CANCER  . CHF (congestive heart failure)     Past Surgical History  Procedure Laterality Date  . Nasal septum surgery  1967  . Pacemaker placement  06/2010    [facility] Acc[employer] RF DR, Model [id number] ( Serial number [id number])  . Thromboembolectomy and four compartment fasciotomy  2009    GROIN  AREA  . Cardiac catheterization    . Cataract extraction Right   . Cholecystectomy  2012  . Insert / replace / remove pacemaker  2011  . Transurethral resection of bladder tumor with gyrus (turbt-gyrus) N/A 10/26/2013    Procedure: TRANSURETHRAL RESECTION OF BLADDER TUMOR WITH GYRUS (TURBT-GYRUS);  Surgeon: Alexis Frock, MD;  Location: [facility];  Service: Urology;  Laterality: N/A;  . Cystoscopy w/ ureteral stent placement Right 10/26/2013    Procedure: CYSTOSCOPY WITH RETROGRADE PYELOGRAM/URETERAL STENT PLACEMENT;  Surgeon: Alexis Frock, MD;  Location: [facility];  Service: Urology;  Laterality: Right;  . Embolectomy Left 11/02/2013    Procedure: LEFT FEMORAL EMBOLECTOMY, LEFT FEMORAL ARTERY ENDARTERECTOMY WITH DACRON PATCH ANGIOPLASTY.;  Surgeon: Mal Misty, MD;  Location: [facility];  Service: Vascular;  Laterality: Left;  . Transurethral resection of bladder tumor with gyrus (turbt-gyrus) N/A 01/11/2014    Procedure: TRANSURETHRAL RESECTION OF BLADDER TUMOR WITH GYRUS (TURBT-GYRUS);  Surgeon: Alexis Frock, MD;  Location: [facility];  Service: Urology;  Laterality: N/A;  . Cystoscopy with ureteroscopy and stent placement Right 01/11/2014  Procedure: CYSTOSCOPY WITH URETEROSCOPY ,RIGHT RETROGRADE AND STENT CHANGE AND LASER OF URETERAL TUMOR;  Surgeon: Alexis Frock, MD;  Location: [facility];  Service: Urology;  Laterality: Right;  . Holmium laser application Right [id number]    Procedure: HOLMIUM LASER APPLICATION;  Surgeon: Alexis Frock, MD;  Location: [facility];  Service: Urology;  Laterality: Right;    History   Social History  . Marital Status: Married    Spouse Name: N/A    Number of Children: N/A  . 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 Smoking status: Former Smoker -- 2.00 packs/day for 50 years    Types: Cigarettes    Quit date: 12/08/2006  . Smokeless tobacco: Never Used  . Alcohol Use: No     Comment: quit  3 years ago  . Drug Use: No  . Sexual Activity: Not on file   Other Topics Concern  . Not on file   Social History Narrative  . No narrative on file    Current Outpatient Prescriptions on File Prior to Visit  Medication Sig Dispense Refill  . acetaminophen (TYLENOL) 500 MG tablet Take 500 mg by mouth every 6 (six) hours as needed for moderate pain.       Marland Kitchen albuterol (PROVENTIL HFA;VENTOLIN HFA) 108 (90 BASE) MCG/ACT inhaler Inhale 2 puffs into the lungs every 6 (six) hours as needed for wheezing.      Marland Kitchen ALPRAZolam (XANAX) 0.25 MG tablet Take 0.25 mg by mouth 3 (three) times daily as needed for anxiety.      . Ascorbic Acid (VITAMIN C) 500 MG tablet Take 500 mg by mouth daily.       Marland Kitchen aspirin 325 MG tablet Take 325 mg by mouth every morning.       . brimonidine (ALPHAGAN P) 0.1 % SOLN Place 1 drop into the left eye 3 (three) times daily.       . cholecalciferol (VITAMIN D) 1000 UNITS tablet Take 1,000 Units by mouth daily.      Marland Kitchen Dextromethorphan-Guaifenesin 10-200 MG/15ML LIQD Take 20 mLs by mouth every 4 (four) hours as needed (cough).      . dorzolamide-timolol (COSOPT) 22.3-6.8 MG/ML ophthalmic solution Place 1 drop into both eyes 2 (two) times daily.       Marland Kitchen enoxaparin (LOVENOX) 150 MG/ML injection Inject 145 mg into the skin every 12 (twelve) hours.      . fluticasone (FLONASE) 50 MCG/ACT nasal spray Place 2 sprays into both nostrils daily as needed for allergies or rhinitis.      . furosemide (LASIX) 40 MG tablet Take 20 mg by mouth every morning.      Marland Kitchen guaiFENesin (MUCINEX) 600 MG 12 hr tablet Take 600 mg by mouth 2 (two) times daily as needed for cough or to loosen phlegm.       . insulin regular human CONCENTRATED (HUMULIN R) 500 UNIT/ML SOLN injection Inject 40-60 Units into the skin 4 (four) times daily -  before meals and at bedtime. Because it is concentrated, please draw up as though it is 60 units at a time.slding scale BS 150-200=40, >200=60      . latanoprost (XALATAN)  0.005 % ophthalmic solution Place 1 drop into both eyes at bedtime.      . Multiple Vitamin (MULTIVITAMIN WITH MINERALS) TABS tablet Take 1 tablet by mouth daily.      . OMEGA-3 1000 MG  CAPS Take 1,000 mg by mouth daily.       Marland Kitchen omeprazole (PRILOSEC) 20 MG capsule Take 20 mg by mouth daily.      Marland Kitchen PROAIR HFA 108 (90 BASE) MCG/ACT inhaler INHALE 2 PUFFS EVERY 4 TO 6 HOURS AS NEEDED  8.5 each  1  . Probiotic Product (PROBIOTIC ACIDOPHILUS) CAPS Take 1 capsule by mouth daily.       . traMADol (ULTRAM) 50 MG tablet Take 2 tablets (100 mg total) by mouth every 6 (six) hours as needed for moderate pain. Post-operatively  30 tablet  0  . venlafaxine XR (EFFEXOR-XR) 150 MG 24 hr capsule Take 150 mg by mouth daily as needed. Pt only take if needed for depression       No current facility-administered medications on file prior to visit.    Allergies  Allergen Reactions  . Lactose Intolerance (Gi)     Gi upset  . Pioglitazone     ACTOS  REACTION: Edema    Family History  Problem Relation Age of Onset  . Liver cancer Mother     deceased age 75  . Cancer Mother     liver cancer  . Colon cancer Neg Hx   . Heart attack Father     BP 118/70  Pulse 78  Temp(Src) 98.3 F (36.8 C) (Oral)  Ht 5\' 10"  (1.778 m)  Wt 317 lb (143.79 kg)  BMI 45.48 kg/m2  SpO2 97%  Review of Systems Denies LOC and weight change    Objective:   Physical Exam VITAL SIGNS:  See vs page GENERAL: no distress  Lab Results  Component Value Date   HGBA1C 8.6* 02/10/2014      Assessment & Plan:  DM: This insulin regimen was chosen from multiple options, as it best matches his insulin to his changing requirements throughout the day.  The benefits of glycemic control must be weighed against the risks of hypoglycemia.  He cannot safely get a1c to 7%, given these variable cbg's.  Also, this a1c is affected by hyperglycemia in the hospital.  Renal insufficiency: this increases the duration of action of insulin.   CHF:  this limits exercise rx of DM.

## 2014-02-15 ENCOUNTER — Ambulatory Visit (INDEPENDENT_AMBULATORY_CARE_PROVIDER_SITE_OTHER): Payer: Medicare HMO | Admitting: General Practice

## 2014-02-15 DIAGNOSIS — I743 Embolism and thrombosis of arteries of the lower extremities: Secondary | ICD-10-CM

## 2014-02-15 DIAGNOSIS — Z5181 Encounter for therapeutic drug level monitoring: Secondary | ICD-10-CM | POA: Insufficient documentation

## 2014-02-15 LAB — POCT INR: INR: 2.5

## 2014-02-15 NOTE — Progress Notes (Signed)
Pre visit review using our clinic review tool, if applicable. No additional management support is needed unless otherwise documented below in the visit note. 

## 2014-03-15 ENCOUNTER — Ambulatory Visit (INDEPENDENT_AMBULATORY_CARE_PROVIDER_SITE_OTHER): Payer: Commercial Managed Care - HMO | Admitting: General Practice

## 2014-03-15 DIAGNOSIS — Z5181 Encounter for therapeutic drug level monitoring: Secondary | ICD-10-CM

## 2014-03-15 DIAGNOSIS — I743 Embolism and thrombosis of arteries of the lower extremities: Secondary | ICD-10-CM

## 2014-03-15 LAB — POCT INR: INR: 2.9

## 2014-03-15 NOTE — Progress Notes (Signed)
Pre visit review using our clinic review tool, if applicable. No additional management support is needed unless otherwise documented below in the visit note. 

## 2014-04-10 ENCOUNTER — Telehealth: Payer: Self-pay | Admitting: *Deleted

## 2014-04-10 DIAGNOSIS — E119 Type 2 diabetes mellitus without complications: Secondary | ICD-10-CM

## 2014-04-10 NOTE — Telephone Encounter (Signed)
Requested call back to discuss witch eye dr pt will be seeing.

## 2014-04-11 NOTE — Telephone Encounter (Signed)
Pt is requesting a referral to Dr. Monna Fam at [facility].  Thanks!

## 2014-04-11 NOTE — Telephone Encounter (Signed)
Patient informed via voicemail.

## 2014-04-11 NOTE — Telephone Encounter (Signed)
done

## 2014-04-12 ENCOUNTER — Telehealth: Payer: Self-pay | Admitting: Endocrinology

## 2014-04-12 ENCOUNTER — Ambulatory Visit (INDEPENDENT_AMBULATORY_CARE_PROVIDER_SITE_OTHER): Payer: Commercial Managed Care - HMO | Admitting: General Practice

## 2014-04-12 ENCOUNTER — Telehealth: Payer: Self-pay

## 2014-04-12 DIAGNOSIS — I743 Embolism and thrombosis of arteries of the lower extremities: Secondary | ICD-10-CM

## 2014-04-12 DIAGNOSIS — Z5181 Encounter for therapeutic drug level monitoring: Secondary | ICD-10-CM

## 2014-04-12 LAB — POCT INR: INR: 3.3

## 2014-04-12 NOTE — Telephone Encounter (Signed)
Received fax from [facility] Humulin R Concentrated. Medication was filled by Hospital.  Please advise if ok to refill and if instructions are correct. Thanks!

## 2014-04-12 NOTE — Progress Notes (Signed)
Pre visit review using our clinic review tool, if applicable. No additional management support is needed unless otherwise documented below in the visit note. 

## 2014-04-12 NOTE — Telephone Encounter (Signed)
Patient would like to speak with nurse regarding his insulin  Thank You :)

## 2014-04-12 NOTE — Telephone Encounter (Signed)
Pt informed that we are awaiting MD's approval to proceed with refill.

## 2014-04-13 MED ORDER — INSULIN REGULAR HUMAN (CONC) 500 UNIT/ML ~~LOC~~ SOLN: SUBCUTANEOUS | Status: DC

## 2014-04-13 NOTE — Telephone Encounter (Signed)
Directions are correct 

## 2014-04-13 NOTE — Telephone Encounter (Signed)
Rx sent to pharmacy   

## 2014-04-18 ENCOUNTER — Other Ambulatory Visit: Payer: Self-pay | Admitting: Endocrinology

## 2014-04-20 ENCOUNTER — Telehealth: Payer: Self-pay | Admitting: Endocrinology

## 2014-04-20 ENCOUNTER — Other Ambulatory Visit: Payer: Self-pay | Admitting: Endocrinology

## 2014-04-20 MED ORDER — TRAMADOL HCL 50 MG PO TABS: 100.0000 mg | ORAL_TABLET | Freq: Four times a day (QID) | ORAL | Status: DC | PRN

## 2014-04-20 NOTE — Telephone Encounter (Signed)
i printed this am

## 2014-04-20 NOTE — Telephone Encounter (Signed)
Tramadol 50 mg tablet   [facility] [address]  Call back: [phone]  Thank You :)

## 2014-04-20 NOTE — Telephone Encounter (Signed)
Please see below.

## 2014-04-21 NOTE — Telephone Encounter (Signed)
Rx sent to pharmacy 04/20/2014.

## 2014-05-08 ENCOUNTER — Telehealth: Payer: Self-pay | Admitting: Endocrinology

## 2014-05-08 DIAGNOSIS — N390 Urinary tract infection, site not specified: Secondary | ICD-10-CM

## 2014-05-08 NOTE — Telephone Encounter (Signed)
See below, Thanks!  

## 2014-05-08 NOTE — Telephone Encounter (Signed)
Patient states he has an appt with urology Dr. Tresa Moore  He states he needs a referral for insurance    Thank You :)

## 2014-05-08 NOTE — Telephone Encounter (Signed)
done

## 2014-05-10 ENCOUNTER — Ambulatory Visit (INDEPENDENT_AMBULATORY_CARE_PROVIDER_SITE_OTHER): Payer: Commercial Managed Care - HMO | Admitting: General Practice

## 2014-05-10 DIAGNOSIS — I743 Embolism and thrombosis of arteries of the lower extremities: Secondary | ICD-10-CM

## 2014-05-10 DIAGNOSIS — Z5181 Encounter for therapeutic drug level monitoring: Secondary | ICD-10-CM

## 2014-05-10 LAB — POCT INR: INR: 3.7

## 2014-05-10 NOTE — Progress Notes (Signed)
Pre visit review using our clinic review tool, if applicable. No additional management support is needed unless otherwise documented below in the visit note. 

## 2014-05-13 ENCOUNTER — Encounter (HOSPITAL_COMMUNITY): Payer: Self-pay | Admitting: Emergency Medicine

## 2014-05-13 ENCOUNTER — Emergency Department (HOSPITAL_COMMUNITY): Payer: Medicare HMO

## 2014-05-13 ENCOUNTER — Inpatient Hospital Stay (HOSPITAL_COMMUNITY)
Admission: EM | Admit: 2014-05-13 | Discharge: 2014-05-17 | DRG: 308 | Disposition: A | Discharge status: Home Health | Payer: Medicare HMO | Attending: Internal Medicine | Admitting: Internal Medicine

## 2014-05-13 DIAGNOSIS — E1065 Type 1 diabetes mellitus with hyperglycemia: Secondary | ICD-10-CM | POA: Diagnosis not present

## 2014-05-13 DIAGNOSIS — E119 Type 2 diabetes mellitus without complications: Secondary | ICD-10-CM

## 2014-05-13 DIAGNOSIS — E039 Hypothyroidism, unspecified: Secondary | ICD-10-CM | POA: Diagnosis present

## 2014-05-13 DIAGNOSIS — G609 Hereditary and idiopathic neuropathy, unspecified: Secondary | ICD-10-CM | POA: Diagnosis present

## 2014-05-13 DIAGNOSIS — G4733 Obstructive sleep apnea (adult) (pediatric): Secondary | ICD-10-CM

## 2014-05-13 DIAGNOSIS — D649 Anemia, unspecified: Secondary | ICD-10-CM

## 2014-05-13 DIAGNOSIS — F3289 Other specified depressive episodes: Secondary | ICD-10-CM

## 2014-05-13 DIAGNOSIS — Z8551 Personal history of malignant neoplasm of bladder: Secondary | ICD-10-CM

## 2014-05-13 DIAGNOSIS — IMO0002 Reserved for concepts with insufficient information to code with codable children: Secondary | ICD-10-CM | POA: Diagnosis not present

## 2014-05-13 DIAGNOSIS — I4891 Unspecified atrial fibrillation: Principal | ICD-10-CM

## 2014-05-13 DIAGNOSIS — E785 Hyperlipidemia, unspecified: Secondary | ICD-10-CM | POA: Diagnosis present

## 2014-05-13 DIAGNOSIS — Z7901 Long term (current) use of anticoagulants: Secondary | ICD-10-CM

## 2014-05-13 DIAGNOSIS — Z794 Long term (current) use of insulin: Secondary | ICD-10-CM

## 2014-05-13 DIAGNOSIS — N189 Chronic kidney disease, unspecified: Secondary | ICD-10-CM

## 2014-05-13 DIAGNOSIS — E1029 Type 1 diabetes mellitus with other diabetic kidney complication: Secondary | ICD-10-CM

## 2014-05-13 DIAGNOSIS — E7800 Pure hypercholesterolemia, unspecified: Secondary | ICD-10-CM

## 2014-05-13 DIAGNOSIS — J449 Chronic obstructive pulmonary disease, unspecified: Secondary | ICD-10-CM | POA: Diagnosis present

## 2014-05-13 DIAGNOSIS — I4892 Unspecified atrial flutter: Secondary | ICD-10-CM | POA: Diagnosis present

## 2014-05-13 DIAGNOSIS — E1069 Type 1 diabetes mellitus with other specified complication: Secondary | ICD-10-CM

## 2014-05-13 DIAGNOSIS — Z87891 Personal history of nicotine dependence: Secondary | ICD-10-CM

## 2014-05-13 DIAGNOSIS — I10 Essential (primary) hypertension: Secondary | ICD-10-CM

## 2014-05-13 DIAGNOSIS — I5033 Acute on chronic diastolic (congestive) heart failure: Secondary | ICD-10-CM

## 2014-05-13 DIAGNOSIS — Z950 Presence of cardiac pacemaker: Secondary | ICD-10-CM

## 2014-05-13 DIAGNOSIS — Z79899 Other long term (current) drug therapy: Secondary | ICD-10-CM

## 2014-05-13 DIAGNOSIS — I129 Hypertensive chronic kidney disease with stage 1 through stage 4 chronic kidney disease, or unspecified chronic kidney disease: Secondary | ICD-10-CM | POA: Diagnosis present

## 2014-05-13 DIAGNOSIS — R791 Abnormal coagulation profile: Secondary | ICD-10-CM | POA: Diagnosis not present

## 2014-05-13 DIAGNOSIS — H409 Unspecified glaucoma: Secondary | ICD-10-CM | POA: Diagnosis present

## 2014-05-13 DIAGNOSIS — N183 Chronic kidney disease, stage 3 unspecified: Secondary | ICD-10-CM

## 2014-05-13 DIAGNOSIS — I495 Sick sinus syndrome: Secondary | ICD-10-CM

## 2014-05-13 DIAGNOSIS — R0602 Shortness of breath: Secondary | ICD-10-CM

## 2014-05-13 DIAGNOSIS — J4489 Other specified chronic obstructive pulmonary disease: Secondary | ICD-10-CM | POA: Diagnosis present

## 2014-05-13 DIAGNOSIS — K219 Gastro-esophageal reflux disease without esophagitis: Secondary | ICD-10-CM

## 2014-05-13 DIAGNOSIS — Z7982 Long term (current) use of aspirin: Secondary | ICD-10-CM

## 2014-05-13 DIAGNOSIS — Z6841 Body Mass Index (BMI) 40.0 and over, adult: Secondary | ICD-10-CM

## 2014-05-13 DIAGNOSIS — I509 Heart failure, unspecified: Secondary | ICD-10-CM

## 2014-05-13 DIAGNOSIS — R1013 Epigastric pain: Secondary | ICD-10-CM

## 2014-05-13 DIAGNOSIS — F329 Major depressive disorder, single episode, unspecified: Secondary | ICD-10-CM

## 2014-05-13 DIAGNOSIS — I5031 Acute diastolic (congestive) heart failure: Secondary | ICD-10-CM

## 2014-05-13 DIAGNOSIS — N058 Unspecified nephritic syndrome with other morphologic changes: Secondary | ICD-10-CM | POA: Diagnosis present

## 2014-05-13 DIAGNOSIS — Z8559 Personal history of malignant neoplasm of other urinary tract organ: Secondary | ICD-10-CM

## 2014-05-13 DIAGNOSIS — I872 Venous insufficiency (chronic) (peripheral): Secondary | ICD-10-CM

## 2014-05-13 DIAGNOSIS — N289 Disorder of kidney and ureter, unspecified: Secondary | ICD-10-CM

## 2014-05-13 LAB — I-STAT TROPONIN, ED: Troponin i, poc: 0.03 ng/mL (ref 0.00–0.08)

## 2014-05-13 LAB — GLUCOSE, CAPILLARY
GLUCOSE-CAPILLARY: 106 mg/dL — AB (ref 70–99)
Glucose-Capillary: 152 mg/dL — ABNORMAL HIGH (ref 70–99)
Glucose-Capillary: 79 mg/dL (ref 70–99)

## 2014-05-13 LAB — CBC
HCT: 40 % (ref 39.0–52.0)
Hemoglobin: 13 g/dL (ref 13.0–17.0)
MCH: 30.2 pg (ref 26.0–34.0)
MCHC: 32.5 g/dL (ref 30.0–36.0)
MCV: 93 fL (ref 78.0–100.0)
Platelets: 274 10*3/uL (ref 150–400)
RBC: 4.3 MIL/uL (ref 4.22–5.81)
RDW: 16.7 % — ABNORMAL HIGH (ref 11.5–15.5)
WBC: 11.5 10*3/uL — AB (ref 4.0–10.5)

## 2014-05-13 LAB — BASIC METABOLIC PANEL
BUN: 28 mg/dL — AB (ref 6–23)
CHLORIDE: 99 meq/L (ref 96–112)
CO2: 21 meq/L (ref 19–32)
Calcium: 9.1 mg/dL (ref 8.4–10.5)
Creatinine, Ser: 1.47 mg/dL — ABNORMAL HIGH (ref 0.50–1.35)
GFR calc non Af Amer: 46 mL/min — ABNORMAL LOW (ref >90)
GFR, EST AFRICAN AMERICAN: 54 mL/min — AB (ref >90)
Glucose, Bld: 332 mg/dL — ABNORMAL HIGH (ref 70–99)
POTASSIUM: 4.5 meq/L (ref 3.7–5.3)
Sodium: 137 mEq/L (ref 137–147)

## 2014-05-13 LAB — PROTIME-INR
INR: 1.75 — AB (ref 0.00–1.49)
Prothrombin Time: 19.9 seconds — ABNORMAL HIGH (ref 11.6–15.2)

## 2014-05-13 LAB — PRO B NATRIURETIC PEPTIDE: PRO B NATRI PEPTIDE: 2142 pg/mL — AB (ref 0–125)

## 2014-05-13 LAB — TROPONIN I: Troponin I: <0.3 ng/mL (ref <0.30)

## 2014-05-13 LAB — MRSA PCR SCREENING: MRSA by PCR: POSITIVE — AB

## 2014-05-13 MED ORDER — WARFARIN - PHARMACIST DOSING INPATIENT
Freq: Every day | Status: DC
  Administered 2014-05-15 – 2014-05-16 (×2)

## 2014-05-13 MED ORDER — ASPIRIN 325 MG PO TABS
325.0000 mg | ORAL_TABLET | Freq: Every day | ORAL | Status: DC
  Administered 2014-05-14 – 2014-05-17 (×4): 325 mg via ORAL
  Filled 2014-05-13 (×4): qty 1

## 2014-05-13 MED ORDER — ALBUTEROL SULFATE (2.5 MG/3ML) 0.083% IN NEBU
5.0000 mg | INHALATION_SOLUTION | Freq: Once | RESPIRATORY_TRACT | Status: AC
  Administered 2014-05-13: 5 mg via RESPIRATORY_TRACT
  Filled 2014-05-13: qty 6

## 2014-05-13 MED ORDER — BRIMONIDINE TARTRATE 0.2 % OP SOLN
1.0000 [drp] | Freq: Three times a day (TID) | OPHTHALMIC | Status: DC
  Administered 2014-05-13 – 2014-05-17 (×10): 1 [drp] via OPHTHALMIC
  Filled 2014-05-13: qty 5

## 2014-05-13 MED ORDER — INSULIN ASPART 100 UNIT/ML ~~LOC~~ SOLN
7.0000 [IU] | Freq: Once | SUBCUTANEOUS | Status: AC
  Administered 2014-05-13: 7 [IU] via SUBCUTANEOUS
  Filled 2014-05-13: qty 1

## 2014-05-13 MED ORDER — RISAQUAD PO CAPS
1.0000 | ORAL_CAPSULE | Freq: Every day | ORAL | Status: DC
  Administered 2014-05-14 – 2014-05-17 (×4): 1 via ORAL
  Filled 2014-05-13 (×4): qty 1

## 2014-05-13 MED ORDER — ATORVASTATIN CALCIUM 40 MG PO TABS
40.0000 mg | ORAL_TABLET | Freq: Every day | ORAL | Status: DC
  Administered 2014-05-13 – 2014-05-16 (×4): 40 mg via ORAL
  Filled 2014-05-13 (×5): qty 1

## 2014-05-13 MED ORDER — ACETAMINOPHEN 500 MG PO TABS: 500.0000 mg | ORAL_TABLET | Freq: Two times a day (BID) | ORAL | Status: DC | PRN

## 2014-05-13 MED ORDER — INSULIN REGULAR HUMAN (CONC) 500 UNIT/ML ~~LOC~~ SOLN: 300.0000 [IU] | Freq: Three times a day (TID) | SUBCUTANEOUS | Status: DC

## 2014-05-13 MED ORDER — VITAMIN C 500 MG PO TABS
500.0000 mg | ORAL_TABLET | Freq: Every day | ORAL | Status: DC
  Administered 2014-05-14 – 2014-05-17 (×4): 500 mg via ORAL
  Filled 2014-05-13 (×4): qty 1

## 2014-05-13 MED ORDER — INSULIN REGULAR HUMAN (CONC) 500 UNIT/ML ~~LOC~~ SOLN
300.0000 [IU] | Freq: Three times a day (TID) | SUBCUTANEOUS | Status: DC
  Administered 2014-05-14: 300 [IU] via SUBCUTANEOUS
  Filled 2014-05-13 (×2): qty 20

## 2014-05-13 MED ORDER — SODIUM CHLORIDE 0.9 % IJ SOLN: 3.0000 mL | INTRAMUSCULAR | Status: DC | PRN

## 2014-05-13 MED ORDER — FUROSEMIDE 10 MG/ML IJ SOLN
40.0000 mg | Freq: Two times a day (BID) | INTRAMUSCULAR | Status: DC
  Administered 2014-05-13 – 2014-05-16 (×6): 40 mg via INTRAVENOUS
  Filled 2014-05-13 (×9): qty 4

## 2014-05-13 MED ORDER — IPRATROPIUM BROMIDE 0.02 % IN SOLN
0.5000 mg | Freq: Once | RESPIRATORY_TRACT | Status: AC
  Administered 2014-05-13: 0.5 mg via RESPIRATORY_TRACT
  Filled 2014-05-13: qty 2.5

## 2014-05-13 MED ORDER — ADULT MULTIVITAMIN W/MINERALS CH
1.0000 | ORAL_TABLET | Freq: Every day | ORAL | Status: DC
Start: 2014-05-14 — End: 2014-05-17
  Administered 2014-05-14 – 2014-05-17 (×4): 1 via ORAL
  Filled 2014-05-13 (×4): qty 1

## 2014-05-13 MED ORDER — ALBUTEROL SULFATE HFA 108 (90 BASE) MCG/ACT IN AERS: 2.0000 | INHALATION_SPRAY | Freq: Four times a day (QID) | RESPIRATORY_TRACT | Status: DC | PRN

## 2014-05-13 MED ORDER — SODIUM CHLORIDE 0.9 % IJ SOLN
3.0000 mL | Freq: Two times a day (BID) | INTRAMUSCULAR | Status: DC
  Administered 2014-05-13 – 2014-05-17 (×6): 3 mL via INTRAVENOUS

## 2014-05-13 MED ORDER — FLUTICASONE PROPIONATE 50 MCG/ACT NA SUSP
2.0000 | Freq: Every day | NASAL | Status: DC | PRN
  Filled 2014-05-13: qty 16

## 2014-05-13 MED ORDER — VITAMIN D3 25 MCG (1000 UNIT) PO TABS
1000.0000 [IU] | ORAL_TABLET | Freq: Every day | ORAL | Status: DC
  Administered 2014-05-14 – 2014-05-17 (×4): 1000 [IU] via ORAL
  Filled 2014-05-13 (×4): qty 1

## 2014-05-13 MED ORDER — DORZOLAMIDE HCL-TIMOLOL MAL 2-0.5 % OP SOLN
1.0000 [drp] | Freq: Two times a day (BID) | OPHTHALMIC | Status: DC
  Administered 2014-05-14 – 2014-05-17 (×7): 1 [drp] via OPHTHALMIC
  Filled 2014-05-13: qty 10

## 2014-05-13 MED ORDER — ALBUTEROL SULFATE (2.5 MG/3ML) 0.083% IN NEBU: 2.5000 mg | INHALATION_SOLUTION | Freq: Four times a day (QID) | RESPIRATORY_TRACT | Status: DC | PRN

## 2014-05-13 MED ORDER — CALCIUM CARBONATE ANTACID 500 MG PO CHEW
2.0000 | CHEWABLE_TABLET | Freq: Two times a day (BID) | ORAL | Status: DC | PRN
  Filled 2014-05-13: qty 2

## 2014-05-13 MED ORDER — SODIUM CHLORIDE 0.9 % IV SOLN
250.0000 mL | INTRAVENOUS | Status: DC | PRN
  Administered 2014-05-14: 250 mL via INTRAVENOUS

## 2014-05-13 MED ORDER — CHLORHEXIDINE GLUCONATE CLOTH 2 % EX PADS
6.0000 | MEDICATED_PAD | Freq: Every day | CUTANEOUS | Status: DC
  Administered 2014-05-14 – 2014-05-17 (×4): 6 via TOPICAL

## 2014-05-13 MED ORDER — DEXTROMETHORPHAN POLISTIREX 30 MG/5ML PO LQCR
90.0000 mg | Freq: Two times a day (BID) | ORAL | Status: DC | PRN
  Filled 2014-05-13: qty 15

## 2014-05-13 MED ORDER — INSULIN ASPART 100 UNIT/ML ~~LOC~~ SOLN: 0.0000 [IU] | Freq: Three times a day (TID) | SUBCUTANEOUS | Status: DC

## 2014-05-13 MED ORDER — TRAMADOL HCL 50 MG PO TABS
100.0000 mg | ORAL_TABLET | Freq: Two times a day (BID) | ORAL | Status: DC | PRN
Start: 2014-05-13 — End: 2014-05-17
  Administered 2014-05-14 – 2014-05-16 (×2): 100 mg via ORAL
  Filled 2014-05-13 (×2): qty 2

## 2014-05-13 MED ORDER — WARFARIN SODIUM 6 MG PO TABS
6.0000 mg | ORAL_TABLET | ORAL | Status: AC
  Administered 2014-05-13: 6 mg via ORAL
  Filled 2014-05-13: qty 1

## 2014-05-13 MED ORDER — PANTOPRAZOLE SODIUM 40 MG PO TBEC
40.0000 mg | DELAYED_RELEASE_TABLET | Freq: Every day | ORAL | Status: DC
  Administered 2014-05-14 – 2014-05-17 (×4): 40 mg via ORAL
  Filled 2014-05-13 (×4): qty 1

## 2014-05-13 MED ORDER — FUROSEMIDE 10 MG/ML IJ SOLN
40.0000 mg | Freq: Once | INTRAMUSCULAR | Status: AC
  Administered 2014-05-13: 40 mg via INTRAVENOUS
  Filled 2014-05-13: qty 4

## 2014-05-13 MED ORDER — FLUDROCORTISONE ACETATE 0.1 MG PO TABS
0.1000 mg | ORAL_TABLET | ORAL | Status: DC
  Administered 2014-05-15: 0.1 mg via ORAL
  Filled 2014-05-13: qty 1

## 2014-05-13 MED ORDER — INSULIN ASPART 100 UNIT/ML ~~LOC~~ SOLN: 0.0000 [IU] | Freq: Every day | SUBCUTANEOUS | Status: DC

## 2014-05-13 MED ORDER — LATANOPROST 0.005 % OP SOLN
1.0000 [drp] | Freq: Every day | OPHTHALMIC | Status: DC
  Administered 2014-05-13 – 2014-05-16 (×4): 1 [drp] via OPHTHALMIC
  Filled 2014-05-13: qty 2.5

## 2014-05-13 MED ORDER — LEVOTHYROXINE SODIUM 50 MCG PO TABS
50.0000 ug | ORAL_TABLET | Freq: Every day | ORAL | Status: DC
  Administered 2014-05-14 – 2014-05-17 (×4): 50 ug via ORAL
  Filled 2014-05-13 (×5): qty 1

## 2014-05-13 MED ORDER — ALBUTEROL SULFATE (2.5 MG/3ML) 0.083% IN NEBU: 2.5000 mg | INHALATION_SOLUTION | RESPIRATORY_TRACT | Status: DC | PRN

## 2014-05-13 MED ORDER — MUPIROCIN 2 % EX OINT
1.0000 "application " | TOPICAL_OINTMENT | Freq: Two times a day (BID) | CUTANEOUS | Status: DC
  Administered 2014-05-13 – 2014-05-17 (×8): 1 via NASAL
  Filled 2014-05-13 (×2): qty 22

## 2014-05-13 NOTE — ED Notes (Signed)
Pt reports sob, irregular HR and left shoulder pain x 2 days but more severe today. Reports hx of irregular HR and has a pacemaker. Airway intact, spo2 98% at triage.

## 2014-05-13 NOTE — Progress Notes (Signed)
ANTICOAGULATION CONSULT NOTE - Initial Consult  Pharmacy Consult for Coumadin / Insulin U500 Indication: DVT , afib/ continue home U500 regimen  Allergies  Allergen Reactions  . Pioglitazone Swelling    Patient Measurements: Height: 5\' 11"  (180.3 cm) Weight: 319 lb 14.2 oz (145.1 kg) IBW/kg (Calculated) : 75.3  Vital Signs: Temp: 97.5 F (36.4 C) (06/06 1716) Temp src: Oral (06/06 1716) BP: 143/52 mmHg (06/06 1716) Pulse Rate: 67 (06/06 1716)  Labs:  Recent Labs  05/13/14 1338 05/13/14 1618  HGB 13.0  --   HCT 40.0  --   PLT 274  --   LABPROT  --  19.9*  INR  --  1.75*  CREATININE 1.47*  --     Estimated Creatinine Clearance: 67.3 ml/min (by C-G formula based on Cr of 1.47).   Medical History: Past Medical History  Diagnosis Date  . Diabetes mellitus   . COPD (chronic obstructive pulmonary disease)     CPAP  . Renal insufficiency   . Hyperlipemia   . Other and unspecified coagulation defects   . Hypothyroidism   . Hyperkalemia   . 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 Sick sinus syndrome     s/p PPM by JA  . Pancreatitis   . Obesity   . Constipation   . OSA on CPAP     using CPAP although it is hard for him to tolerate  . SOB (shortness of breath)   . CAD (coronary artery disease)     not much  . Anginal pain     at times  . Hypertension     occasional  . Pacemaker   . Depression     recently  . Anxiety   . H/O Legionnaire's disease 2003  . History of blood clots     R groin  . Peripheral neuropathy   . Osteoarthritis     fingers  . GERD (gastroesophageal reflux disease)   . Anemia     hx of  . Pneumonia 2003  . Cancer 2014    bladder cancer AND RIGHT URETERAL CANCER  . CHF (congestive heart failure)     Assessment:  73 YOM with DM and hx of DVT and afib on coumadin PTA, admitted for worsening SOB. Pharmacy is consulted to manage coumadin and also U500. INR 1.75 today  PTA coumadin dose: 4mg  on MWF, 6mg  on STTS (last  dose 6/5)  Per patient he takes insulin 3-4 times daily with CBG checking. Usually he inject 300 units (0.6 ml) with major meals if his CBG is > 200, and 100 units (0.2 ml) with a snack. If his CBG is between 100 - 200, then he gives himself half of the dose. If CBG < 100, then no insulin. He is currently on heart healthy/carb modified diet. CBG 150 on ACHS cbg checks.   Goal of Therapy:  INR 2-3 Monitor platelets by anticoagulation protocol: Yes   Plan:  - Coumadin 6mg  po x 1 tonight - Daily PT/INR - Insulin U500 3 times daily with meal and with CBG check,  if CBG > 200, give 300 units (0.6 ml);  if CBG 100 - 200, give 150 units (0.58ml);  if CBG < 100, do not give U500 - d/c sliding scale insulin - f/u CBGs  Maryanna Shape, PharmD, BCPS  Clinical Pharmacist  Pager: [phone]   05/13/2014,6:34 PM

## 2014-05-13 NOTE — ED Provider Notes (Signed)
CSN: [id number]     Arrival date & time 05/13/14  1326 History   First MD Initiated Contact with Patient 05/13/14 1423     Chief Complaint  Patient presents with  . Shortness of Breath     (Consider location/radiation/quality/duration/timing/severity/associated sxs/prior Treatment) HPI Pt presenting with several days of worsening fatigue and dyspnea on exertion.  PT states he feels his heart racing at time as well.  Some increase in his lower extremity edema over the past several days as well.  Pt has also been having intermittent pain in left shoulder as well- this is worse with movement and is not associated with his breathing difficulties.  No fever/chills.  No cough.  Sob worse with lying flat.  Pt states he has been taking his lasix as directed.  Has been wearing compression stockings for his swelling.  There are no other associated systemic symptoms, there are no other alleviating or modifying factors.   Past Medical History  Diagnosis Date  . Diabetes mellitus   . COPD (chronic obstructive pulmonary disease)     CPAP  . Renal insufficiency   . Hyperlipemia   . Other and unspecified coagulation defects   . Hypothyroidism   . Hyperkalemia   . 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 Sick sinus syndrome     s/p PPM by JA  . Pancreatitis   . Obesity   . Constipation   . OSA on CPAP     using CPAP although it is hard for him to tolerate  . SOB (shortness of breath)   . CAD (coronary artery disease)     not much  . Anginal pain     at times  . Hypertension     occasional  . Pacemaker   . Depression     recently  . Anxiety   . H/O Legionnaire's disease 2003  . History of blood clots     R groin  . Peripheral neuropathy   . Osteoarthritis     fingers  . GERD (gastroesophageal reflux disease)   . Anemia     hx of  . Pneumonia 2003  . Cancer 2014    bladder cancer AND RIGHT URETERAL CANCER  . CHF (congestive heart failure)    Past Surgical History   Procedure Laterality Date  . Nasal septum surgery  1967  . Pacemaker placement  06/2010    [facility] Accent RF DR, Model [id number] ( Serial number [id number])  . Thromboembolectomy and four compartment fasciotomy  2009    GROIN AREA  . Cardiac catheterization    . Cataract extraction Right   . Cholecystectomy  2012  . Insert / replace / remove pacemaker  2011  . Transurethral resection of bladder tumor with gyrus (turbt-gyrus) N/A 10/26/2013    Procedure: TRANSURETHRAL RESECTION OF BLADDER TUMOR WITH GYRUS (TURBT-GYRUS);  Surgeon: Alexis Frock, MD;  Location: [facility];  Service: Urology;  Laterality: N/A;  . Cystoscopy w/ ureteral stent placement Right 10/26/2013    Procedure: CYSTOSCOPY WITH RETROGRADE PYELOGRAM/URETERAL STENT PLACEMENT;  Surgeon: Alexis Frock, MD;  Location: [facility];  Service: Urology;  Laterality: Right;  . Embolectomy Left 11/02/2013    Procedure: LEFT FEMORAL EMBOLECTOMY, LEFT FEMORAL ARTERY ENDARTERECTOMY WITH DACRON PATCH ANGIOPLASTY.;  Surgeon: Mal Misty, MD;  Location: [place];  Service: Vascular;  Laterality: Left;  . Transurethral resection of bladder tumor with gyrus (turbt-gyrus) N/A 01/11/2014    Procedure: TRANSURETHRAL RESECTION  OF BLADDER TUMOR WITH GYRUS (TURBT-GYRUS);  Surgeon: Alexis Frock, MD;  Location: [facility];  Service: Urology;  Laterality: N/A;  . Cystoscopy with ureteroscopy and stent placement Right 01/11/2014    Procedure: CYSTOSCOPY WITH URETEROSCOPY ,RIGHT RETROGRADE AND STENT CHANGE AND LASER OF URETERAL TUMOR;  Surgeon: Alexis Frock, MD;  Location: [facility];  Service: Urology;  Laterality: Right;  . Holmium laser application Right 08/10/7341    Procedure: HOLMIUM LASER APPLICATION;  Surgeon: Alexis Frock, MD;  Location: [facility];  Service: Urology;  Laterality: Right;   Family History  Problem Relation Age of Onset  . Liver cancer Mother     deceased age 4  . Cancer Mother     liver cancer  . Colon cancer Neg Hx   . Heart attack Father     History  Substance Use Topics  . Smoking status: Former Smoker -- 2.00 packs/day for 50 years    Types: Cigarettes    Quit date: 12/08/2006  . Smokeless tobacco: Never Used  . Alcohol Use: No     Comment: quit 3 years ago    Review of Systems ROS reviewed and all otherwise negative except for mentioned in HPI    Allergies  Pioglitazone  Home Medications   Prior to Admission medications   Medication Sig Start Date End Date Taking? Authorizing Provider  acetaminophen (TYLENOL) 500 MG tablet Take 500 mg by mouth 2 (two) times daily as needed for moderate pain (pain).    Yes Historical Provider, MD  albuterol (PROAIR HFA) 108 (90 BASE) MCG/ACT inhaler Inhale 2 puffs into the lungs every 6 (six) hours as needed for wheezing or shortness of breath.   Yes Historical Provider, MD  Ascorbic Acid (VITAMIN C) 500 MG tablet Take 500 mg by mouth daily.    Yes Historical Provider, MD  aspirin 325 MG tablet Take 325 mg by mouth daily.    Yes Historical Provider, MD  brimonidine (ALPHAGAN P) 0.1 % SOLN Place 1 drop into the left eye 3 (three) times daily.    Yes Historical Provider, MD  calcium carbonate (TUMS - DOSED IN MG ELEMENTAL CALCIUM) 500 MG chewable tablet Chew 2 tablets by mouth 2 (two) times daily as needed for indigestion or heartburn.   Yes Historical Provider, MD  cholecalciferol (VITAMIN D) 1000 UNITS tablet Take 1,000 Units by mouth daily.   Yes Historical Provider, MD  dextromethorphan (DELSYM) 30 MG/5ML liquid Take 90 mg by mouth 2 (two) times daily as needed for cough.   Yes Historical Provider, MD  dorzolamide-timolol (COSOPT) 22.3-6.8 MG/ML ophthalmic solution Place 1 drop into both eyes 2 (two) times daily.  02/17/13  Yes Historical Provider, MD  fludrocortisone (FLORINEF) 0.1 MG tablet Take 0.1 mg by mouth once a week. On Mondays   Yes Historical Provider, MD  fluticasone (FLONASE) 50 MCG/ACT nasal spray Place 2 sprays into both nostrils daily as needed for allergies or  rhinitis.   Yes Historical Provider, MD  furosemide (LASIX) 40 MG tablet Take 20 mg by mouth daily.    Yes Historical Provider, MD  guaifenesin (HUMIBID E) 400 MG TABS tablet Take 400 mg by mouth 2 (two) times daily as needed (congestion).  04/10/14  Yes Historical Provider, MD  insulin regular human CONCENTRATED (HUMULIN R) 500 UNIT/ML SOLN injection Inject 300 Units into the skin 3 (three) times daily with meals. Inject 300 Units into the skin 3 times daily with meals (or 4 times daily: 300 units (drawn up as 60 units) with  breakfast and lunch, 200 units (drawn up as 40 units with supper and 100 units (drawn up as 20 units) with bedtime snack) . Because it is concentrated, please draw up as though it is 60 units at a time.   Yes Historical Provider, MD  Lactobacillus (ACIDOPHILUS) CAPS capsule Take 1 capsule by mouth daily.   Yes Historical Provider, MD  latanoprost (XALATAN) 0.005 % ophthalmic solution Place 1 drop into both eyes at bedtime.   Yes Historical Provider, MD  levothyroxine (SYNTHROID, LEVOTHROID) 50 MCG tablet Take 50 mcg by mouth daily before breakfast.   Yes Historical Provider, MD  Multiple Vitamin (MULTIVITAMIN WITH MINERALS) TABS tablet Take 1 tablet by mouth daily.   Yes Historical Provider, MD  Omega-3 Fatty Acids (FISH OIL) 1000 MG CAPS Take 1,000 mg by mouth daily.   Yes Historical Provider, MD  omeprazole (PRILOSEC) 20 MG capsule Take 20 mg by mouth daily.   Yes Historical Provider, MD  rosuvastatin (CRESTOR) 40 MG tablet Take 0.5 tablets (20 mg total) by mouth every evening. 02/10/14  Yes Renato Shin, MD  traMADol (ULTRAM) 50 MG tablet Take 100 mg by mouth 2 (two) times daily as needed (pain).   Yes Historical Provider, MD  warfarin (COUMADIN) 4 MG tablet Take 4-6 mg by mouth at bedtime. Take 1 tablet (4 mg) on Monday, Wednesday and Friday, take 1 1/2 tablet (6 mg) on Sunday, Tuesday, Thursday, Saturday   Yes Historical Provider, MD   BP 143/52  Pulse 67  Temp(Src) 97.5 F  (36.4 C) (Oral)  Resp 22  Ht 5\' 11"  (1.803 m)  Wt 319 lb 14.2 oz (145.1 kg)  BMI 44.64 kg/m2  SpO2 100% Vitals reviewed Physical Exam Physical Examination: General appearance - alert, chronically ill appearing, and in no distress Mental status - alert, oriented to person, place, and time Eyes -  No conjunctival injection, no scleral icterus Mouth - mucous membranes moist, pharynx normal without lesions Chest - clear to auscultation- diminished at bases, no wheezes, rales or rhonchi, symmetric air entry Heart - normal rate, regular rhythm, normal S1, S2, no murmurs, rubs, clicks or gallops Abdomen - soft, nontender, nondistended, no masses or organomegaly Extremities - peripheral pulses normal, 2+ symmetric pitting pedal edema, no clubbing or cyanosis Skin-no rash, chronic venous stasis changes in bilateral lower extremities Skin - normal coloration and turgor, no rashes, no suspicious skin lesions noted  ED Course  Procedures (including critical care time) Labs Review Labs Reviewed  CBC - Abnormal; Notable for the following:    WBC 11.5 (*)    RDW 16.7 (*)    All other components within normal limits  BASIC METABOLIC PANEL - Abnormal; Notable for the following:    Glucose, Bld 332 (*)    BUN 28 (*)    Creatinine, Ser 1.47 (*)    GFR calc non Af Amer 46 (*)    GFR calc Af Amer 54 (*)    All other components within normal limits  PRO B NATRIURETIC PEPTIDE - Abnormal; Notable for the following:    Pro B Natriuretic peptide (BNP) 2142.0 (*)    All other components within normal limits  PROTIME-INR - Abnormal; Notable for the following:    Prothrombin Time 19.9 (*)    INR 1.75 (*)    All other components within normal limits  GLUCOSE, CAPILLARY - Abnormal; Notable for the following:    Glucose-Capillary 152 (*)    All other components within normal limits  MRSA PCR SCREENING  BASIC METABOLIC PANEL  TROPONIN I  TROPONIN I  TROPONIN I  PROTIME-INR  I-STAT TROPOININ, ED     Imaging Review Dg Chest 2 View  05/13/2014   CLINICAL DATA:  Shortness of Breath  EXAM: CHEST  2 VIEW  COMPARISON:  January 03, 2014  FINDINGS: There is underlying emphysematous change. There is no edema or consolidation. The heart size is normal. Pulmonary vascularity reflects underlying emphysematous change in is stable. Pacemaker leads are attached to the right atrium and right ventricle. No adenopathy. There is degenerative change in the thoracic spine.  IMPRESSION: Underlying emphysematous change.  No edema or consolidation.   Electronically Signed   By: Lowella Grip M.D.   On: 05/13/2014 15:07     EKG Interpretation   Date/Time:  Saturday May 13 2014 13:31:33 EDT Ventricular Rate:  97 PR Interval:    QRS Duration: 130 QT Interval:  366 QTC Calculation: 464 R Axis:   93 Text Interpretation:  Atrial fibrillation with frequent ventricular-paced  complexes Right bundle branch block Abnormal ECG Since previous tracing  rhythm is irregular Confirmed by Canary Brim  MD, Tamico Mundo [id number]) on 05/13/2014  4:34:08 PM      MDM   Final diagnoses:  Shortness of breath  Renal insufficiency    Pt presents with c/o shortness of breath worse than his baseline.  He also has had heart racing intermittently.  Labs reveal mild worsening of his renal insufficiency, elevation in BNP above baseline.  Pt treated with IV lasix.  [facility]up and management.     Threasa Beards, MD 05/13/14 [phone]

## 2014-05-13 NOTE — H&P (Addendum)
[facility] History and Physical  RIGBY LEONHARDT VOH:[id number] DOB: [birth year] DOA: 05/13/2014  Referring physician: er PCP: Renato Shin, MD   Chief Complaint: SOB  HPI: STEPEHN [name] is a 73 y.o. male  Who comes in with several days of worsening SOB.  He sleeps in a recliner for years so does not endorse orthopnea.  He states he has increasing LE edema.  He takes 20 mg of lasix daily but does not weigh himself.  No fevers, no chills, no wheezing. C/o occasional heart racing.    In the ER, his weight is up from previous admission, his BNP was elevated from previous, chest x ray did not endorse fluid but patient does have LE edema and crackles on exam.  Hospitalist were asked to admit for    Review of Systems:  All systems reviewed, negative unless stated above    Past Medical History  Diagnosis Date  . Diabetes mellitus   . COPD (chronic obstructive pulmonary disease)     CPAP  . Renal insufficiency   . Hyperlipemia   . Other and unspecified coagulation defects   . Hypothyroidism   . Hyperkalemia   . 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 Sick sinus syndrome     s/p PPM by JA  . Pancreatitis   . Obesity   . Constipation   . OSA on CPAP     using CPAP although it is hard for him to tolerate  . SOB (shortness of breath)   . CAD (coronary artery disease)     not much  . Anginal pain     at times  . Hypertension     occasional  . Pacemaker   . Depression     recently  . Anxiety   . H/O Legionnaire's disease 2003  . History of blood clots     R groin  . Peripheral neuropathy   . Osteoarthritis     fingers  . GERD (gastroesophageal reflux disease)   . Anemia     hx of  . Pneumonia 2003  . Cancer 2014    bladder cancer AND RIGHT URETERAL CANCER  . CHF (congestive heart failure)    Past Surgical History  Procedure Laterality Date  . Nasal septum surgery  1967  . Pacemaker placement  06/2010    [facility] Accent RF DR, Model [phone] (  Serial number [id number])  . Thromboembolectomy and four compartment fasciotomy  2009    GROIN AREA  . Cardiac catheterization    . Cataract extraction Right   . Cholecystectomy  2012  . Insert / replace / remove pacemaker  2011  . Transurethral resection of bladder tumor with gyrus (turbt-gyrus) N/A 10/26/2013    Procedure: TRANSURETHRAL RESECTION OF BLADDER TUMOR WITH GYRUS (TURBT-GYRUS);  Surgeon: Alexis Frock, MD;  Location: [facility];  Service: Urology;  Laterality: N/A;  . Cystoscopy w/ ureteral stent placement Right 10/26/2013    Procedure: CYSTOSCOPY WITH RETROGRADE PYELOGRAM/URETERAL STENT PLACEMENT;  Surgeon: Alexis Frock, MD;  Location: [facility];  Service: Urology;  Laterality: Right;  . Embolectomy Left 11/02/2013    Procedure: LEFT FEMORAL EMBOLECTOMY, LEFT FEMORAL ARTERY ENDARTERECTOMY WITH DACRON PATCH ANGIOPLASTY.;  Surgeon: Mal Misty, MD;  Location: [place];  Service: Vascular;  Laterality: Left;  . Transurethral resection of bladder tumor with gyrus (turbt-gyrus) N/A 01/11/2014    Procedure: TRANSURETHRAL RESECTION OF BLADDER TUMOR WITH GYRUS (TURBT-GYRUS);  Surgeon: Alexis Frock, MD;  Location: [facility];  Service: Urology;  Laterality: N/A;  . Cystoscopy with ureteroscopy and stent placement Right 01/11/2014    Procedure: CYSTOSCOPY WITH URETEROSCOPY ,RIGHT RETROGRADE AND STENT CHANGE AND LASER OF URETERAL TUMOR;  Surgeon: Alexis Frock, MD;  Location: [facility];  Service: Urology;  Laterality: Right;  . Holmium laser application Right 08/10/7168    Procedure: HOLMIUM LASER APPLICATION;  Surgeon: Alexis Frock, MD;  Location: [facility];  Service: Urology;  Laterality: Right;   Social History:  reports that he quit smoking about 7 years ago. His smoking use included Cigarettes. He has a 100 pack-year smoking history. He has never used smokeless tobacco. He reports that he does not drink alcohol or use illicit drugs.  Allergies  Allergen Reactions  . Pioglitazone Swelling    Family  History  Problem Relation Age of Onset  . Liver cancer Mother     deceased age 67  . Cancer Mother     liver cancer  . Colon cancer Neg Hx   . Heart attack Father      Prior to Admission medications   Medication Sig Start Date End Date Taking? Authorizing Provider  acetaminophen (TYLENOL) 500 MG tablet Take 500 mg by mouth 2 (two) times daily as needed for moderate pain (pain).    Yes Historical Provider, MD  albuterol (PROAIR HFA) 108 (90 BASE) MCG/ACT inhaler Inhale 2 puffs into the lungs every 6 (six) hours as needed for wheezing or shortness of breath.   Yes Historical Provider, MD  Ascorbic Acid (VITAMIN C) 500 MG tablet Take 500 mg by mouth daily.    Yes Historical Provider, MD  aspirin 325 MG tablet Take 325 mg by mouth daily.    Yes Historical Provider, MD  brimonidine (ALPHAGAN P) 0.1 % SOLN Place 1 drop into the left eye 3 (three) times daily.    Yes Historical Provider, MD  calcium carbonate (TUMS - DOSED IN MG ELEMENTAL CALCIUM) 500 MG chewable tablet Chew 2 tablets by mouth 2 (two) times daily as needed for indigestion or heartburn.   Yes Historical Provider, MD  cholecalciferol (VITAMIN D) 1000 UNITS tablet Take 1,000 Units by mouth daily.   Yes Historical Provider, MD  dextromethorphan (DELSYM) 30 MG/5ML liquid Take 90 mg by mouth 2 (two) times daily as needed for cough.   Yes Historical Provider, MD  dorzolamide-timolol (COSOPT) 22.3-6.8 MG/ML ophthalmic solution Place 1 drop into both eyes 2 (two) times daily.  02/17/13  Yes Historical Provider, MD  fludrocortisone (FLORINEF) 0.1 MG tablet Take 0.1 mg by mouth once a week. On Mondays   Yes Historical Provider, MD  fluticasone (FLONASE) 50 MCG/ACT nasal spray Place 2 sprays into both nostrils daily as needed for allergies or rhinitis.   Yes Historical Provider, MD  furosemide (LASIX) 40 MG tablet Take 20 mg by mouth daily.    Yes Historical Provider, MD  guaifenesin (HUMIBID E) 400 MG TABS tablet Take 400 mg by mouth 2 (two)  times daily as needed (congestion).  04/10/14  Yes Historical Provider, MD  insulin regular human CONCENTRATED (HUMULIN R) 500 UNIT/ML SOLN injection Inject 300 Units into the skin 3 (three) times daily with meals. Inject 300 Units into the skin 3 times daily with meals (or 4 times daily: 300 units (drawn up as 60 units) with breakfast and lunch, 200 units (drawn up as 40 units with supper and 100 units (drawn up as 20 units) with bedtime snack) . Because it is concentrated, please draw up as  though it is 60 units at a time.   Yes Historical Provider, MD  Lactobacillus (ACIDOPHILUS) CAPS capsule Take 1 capsule by mouth daily.   Yes Historical Provider, MD  latanoprost (XALATAN) 0.005 % ophthalmic solution Place 1 drop into both eyes at bedtime.   Yes Historical Provider, MD  levothyroxine (SYNTHROID, LEVOTHROID) 50 MCG tablet Take 50 mcg by mouth daily before breakfast.   Yes Historical Provider, MD  Multiple Vitamin (MULTIVITAMIN WITH MINERALS) TABS tablet Take 1 tablet by mouth daily.   Yes Historical Provider, MD  Omega-3 Fatty Acids (FISH OIL) 1000 MG CAPS Take 1,000 mg by mouth daily.   Yes Historical Provider, MD  omeprazole (PRILOSEC) 20 MG capsule Take 20 mg by mouth daily.   Yes Historical Provider, MD  rosuvastatin (CRESTOR) 40 MG tablet Take 0.5 tablets (20 mg total) by mouth every evening. 02/10/14  Yes Renato Shin, MD  traMADol (ULTRAM) 50 MG tablet Take 100 mg by mouth 2 (two) times daily as needed (pain).   Yes Historical Provider, MD  warfarin (COUMADIN) 4 MG tablet Take 4-6 mg by mouth at bedtime. Take 1 tablet (4 mg) on Monday, Wednesday and Friday, take 1 1/2 tablet (6 mg) on Sunday, Tuesday, Thursday, Saturday   Yes Historical Provider, MD   Physical Exam: Filed Vitals:   05/13/14 1600  BP: 117/49  Pulse: 73  Temp:   Resp: 11    BP 117/49  Pulse 73  Temp(Src) 98.5 F (36.9 C) (Oral)  Resp 11  Ht 5\' 10"  (1.778 m)  Wt 145.151 kg (320 lb)  BMI 45.92 kg/m2  SpO2  94%  General:  Flat affect Eyes: PERRL, normal lids, irises & conjunctiva ENT: grossly normal hearing, lips & tongue Neck: no LAD, masses or thyromegaly Cardiovascular: irr. + LE edema. Respiratory: diminished, crackles at bases Abdomen: soft, ntnd Skin: no rash or induration seen on limited exam Musculoskeletal: grossly normal tone BUE/BLE Psychiatric: flat affect Neurologic: grossly non-focal.          Labs on Admission:  Basic Metabolic Panel:  Recent Labs Lab 05/13/14 1338  NA 137  K 4.5  CL 99  CO2 21  GLUCOSE 332*  BUN 28*  CREATININE 1.47*  CALCIUM 9.1   Liver Function Tests: No results found for this basename: AST, ALT, ALKPHOS, BILITOT, PROT, ALBUMIN,  in the last 168 hours No results found for this basename: LIPASE, AMYLASE,  in the last 168 hours No results found for this basename: AMMONIA,  in the last 168 hours CBC:  Recent Labs Lab 05/13/14 1338  WBC 11.5*  HGB 13.0  HCT 40.0  MCV 93.0  PLT 274   Cardiac Enzymes: No results found for this basename: CKTOTAL, CKMB, CKMBINDEX, TROPONINI,  in the last 168 hours  BNP (last 3 results)  Recent Labs  10/29/13 0447 11/09/13 1359 05/13/14 1338  PROBNP 1362.0* 64.0 2142.0*   CBG: No results found for this basename: GLUCAP,  in the last 168 hours  Radiological Exams on Admission: Dg Chest 2 View  05/13/2014   CLINICAL DATA:  Shortness of Breath  EXAM: CHEST  2 VIEW  COMPARISON:  January 03, 2014  FINDINGS: There is underlying emphysematous change. There is no edema or consolidation. The heart size is normal. Pulmonary vascularity reflects underlying emphysematous change in is stable. Pacemaker leads are attached to the right atrium and right ventricle. No adenopathy. There is degenerative change in the thoracic spine.  IMPRESSION: Underlying emphysematous change.  No edema or consolidation.  Electronically Signed   By: Lowella Grip M.D.   On: 05/13/2014 15:07    EKG: Independently reviewed. A  fib with RVR  Assessment/Plan Active Problems:   OBESITY-MORBID (>100')   OBSTRUCTIVE SLEEP APNEA   Type I (juvenile type) diabetes mellitus with renal manifestations, uncontrolled   Depressive disorder, not elsewhere classified   Shortness of breath   Diastolic CHF, acute on chronic   CKD (chronic kidney disease)   SOB probably due to CHF- diastolic- has been taking lasix although only 20 mg daily, not sure this is enough with his kidney failure; doubt PE as patient is therapeutic with coumadin -cycle CE, echo, IV lasix, daily weights (weight is up)  OSA -CPAP  DM- PCP is [facility] ([facility]) -on U500 -SSI  Anemia -monitor  Atrial fib -has pacemake -not on any rate limiting med???   Depression -outpatient referral -was on PRN effexor???  CKD -unsure of baseline, appears to be < 2  Morbid obesity -encourage activity   Code Status: full Family Communication: patient an wife Disposition Plan:   Time spent: 79 min  Geradine Girt [facility] Pager [phone]  **Disclaimer: This note may have been dictated with voice recognition software. Similar sounding words can inadvertently be transcribed and this note may contain transcription errors which may not have been corrected upon publication of note.**

## 2014-05-13 NOTE — Progress Notes (Signed)
With movement, patient's heart rate up to 140s.  At rest, patient's heart rate sustaining 90s-110s.  Patient asymptomatic.  MD notified.  Will continue to monitor.

## 2014-05-13 NOTE — Progress Notes (Signed)
Patient arrived on unit from ED.  Vital signs stable, wife at bedside.  Patient and wife deny any questions or concerns at this time.  Will continue to monitor.

## 2014-05-14 ENCOUNTER — Encounter (HOSPITAL_COMMUNITY): Payer: Self-pay | Admitting: *Deleted

## 2014-05-14 DIAGNOSIS — I5033 Acute on chronic diastolic (congestive) heart failure: Secondary | ICD-10-CM

## 2014-05-14 DIAGNOSIS — E1065 Type 1 diabetes mellitus with hyperglycemia: Secondary | ICD-10-CM

## 2014-05-14 DIAGNOSIS — F3289 Other specified depressive episodes: Secondary | ICD-10-CM

## 2014-05-14 DIAGNOSIS — F329 Major depressive disorder, single episode, unspecified: Secondary | ICD-10-CM

## 2014-05-14 DIAGNOSIS — E1029 Type 1 diabetes mellitus with other diabetic kidney complication: Secondary | ICD-10-CM

## 2014-05-14 DIAGNOSIS — G4733 Obstructive sleep apnea (adult) (pediatric): Secondary | ICD-10-CM

## 2014-05-14 LAB — GLUCOSE, CAPILLARY
GLUCOSE-CAPILLARY: 100 mg/dL — AB (ref 70–99)
GLUCOSE-CAPILLARY: 113 mg/dL — AB (ref 70–99)
GLUCOSE-CAPILLARY: 250 mg/dL — AB (ref 70–99)
GLUCOSE-CAPILLARY: 40 mg/dL — AB (ref 70–99)
GLUCOSE-CAPILLARY: 79 mg/dL (ref 70–99)
Glucose-Capillary: 326 mg/dL — ABNORMAL HIGH (ref 70–99)
Glucose-Capillary: 42 mg/dL — CL (ref 70–99)

## 2014-05-14 LAB — BASIC METABOLIC PANEL
BUN: 32 mg/dL — ABNORMAL HIGH (ref 6–23)
CALCIUM: 9.1 mg/dL (ref 8.4–10.5)
CO2: 25 meq/L (ref 19–32)
Chloride: 100 mEq/L (ref 96–112)
Creatinine, Ser: 1.5 mg/dL — ABNORMAL HIGH (ref 0.50–1.35)
GFR calc Af Amer: 52 mL/min — ABNORMAL LOW (ref >90)
GFR calc non Af Amer: 45 mL/min — ABNORMAL LOW (ref >90)
Glucose, Bld: 175 mg/dL — ABNORMAL HIGH (ref 70–99)
POTASSIUM: 4.1 meq/L (ref 3.7–5.3)
SODIUM: 140 meq/L (ref 137–147)

## 2014-05-14 LAB — TROPONIN I

## 2014-05-14 LAB — PROTIME-INR
INR: 1.74 — ABNORMAL HIGH (ref 0.00–1.49)
Prothrombin Time: 19.8 seconds — ABNORMAL HIGH (ref 11.6–15.2)

## 2014-05-14 MED ORDER — METOPROLOL TARTRATE 25 MG PO TABS
25.0000 mg | ORAL_TABLET | Freq: Two times a day (BID) | ORAL | Status: DC
  Administered 2014-05-14 – 2014-05-17 (×6): 25 mg via ORAL
  Filled 2014-05-14 (×7): qty 1

## 2014-05-14 MED ORDER — INSULIN REGULAR HUMAN (CONC) 500 UNIT/ML ~~LOC~~ SOLN
0.0000 [IU] | Freq: Three times a day (TID) | SUBCUTANEOUS | Status: DC
  Administered 2014-05-14 (×2): 300 [IU] via SUBCUTANEOUS
  Filled 2014-05-14 (×2): qty 20

## 2014-05-14 MED ORDER — INSULIN REGULAR HUMAN (CONC) 500 UNIT/ML ~~LOC~~ SOLN
150.0000 [IU] | Freq: Once | SUBCUTANEOUS | Status: AC
  Administered 2014-05-14: 150 [IU] via SUBCUTANEOUS
  Filled 2014-05-14: qty 20

## 2014-05-14 MED ORDER — HEPARIN (PORCINE) IN NACL 100-0.45 UNIT/ML-% IJ SOLN
2100.0000 [IU]/h | INTRAMUSCULAR | Status: DC
  Administered 2014-05-14: 1500 [IU]/h via INTRAVENOUS
  Administered 2014-05-15: 1900 [IU]/h via INTRAVENOUS
  Administered 2014-05-15 – 2014-05-16 (×2): 2100 [IU]/h via INTRAVENOUS
  Filled 2014-05-14 (×4): qty 250

## 2014-05-14 MED ORDER — WARFARIN SODIUM 7.5 MG PO TABS
7.5000 mg | ORAL_TABLET | Freq: Once | ORAL | Status: AC
  Administered 2014-05-14: 7.5 mg via ORAL
  Filled 2014-05-14: qty 1

## 2014-05-14 MED ORDER — GUAIFENESIN ER 600 MG PO TB12
600.0000 mg | ORAL_TABLET | Freq: Two times a day (BID) | ORAL | Status: DC | PRN
  Administered 2014-05-14 – 2014-05-16 (×2): 600 mg via ORAL
  Filled 2014-05-14 (×2): qty 1

## 2014-05-14 MED ORDER — DEXTROSE 5 % IV SOLN
INTRAVENOUS | Status: DC
  Administered 2014-05-15: via INTRAVENOUS

## 2014-05-14 NOTE — Progress Notes (Signed)
Patient alert and oriented x4 throughout shift, vital signs stable.  HR up to 140s intermittently during shift, patient at rest during these times and asypmtomatic, MD aware.  Heparin drip started this evening per MD order.  Consent signed and in chart for TEE and cardioversion planned for tomorrow.  Patient aware that he is NPO at midnight.  MD notified as patient requested PRN for congestion; mucinex ordered.  Patient aware and agreeable.  Patient denies any additional questions or concerns at this time.  Will continue to monitor.

## 2014-05-14 NOTE — Progress Notes (Signed)
Pt had a CBG of 40 felt dizzy and diaphoretic gave 2 OJ's and graham and PB, rechecked went to 42, stated felt a lot better, gave another OJ and crackers, rechecked again went to 79, gave some milk, will continue to monitor, Thanks MIke F RN

## 2014-05-14 NOTE — Plan of Care (Cosign Needed)
Pt with symptomatic hypoglycemia (40 and 42) after receiving U-500 insulin at 5pm- per RN did eat supper tray-pt in with CHF due to AF/RVR- respiratory sx's have improved and pt to be NPO after MN so will begin D5W at 42/hr- has U-5OO SSI for CBG >200-currently rate controlled and BP stable  Erin Hearing, ANP

## 2014-05-14 NOTE — Progress Notes (Signed)
ANTICOAGULATION CONSULT NOTE - Follow up [place] for Coumadin + Heparin + U-500 insulin  Indication: DVT , afib/ continue home U500 regimen  Allergies  Allergen Reactions  . Pioglitazone Swelling    Patient Measurements: Height: 5\' 11"  (180.3 cm) Weight: 315 lb 1.6 oz (142.928 kg) (scale A) IBW/kg (Calculated) : 75.3 Heparin dosing weight: 108 kg   Vital Signs: Temp: 97.9 F (36.6 C) (06/07 1418) Temp src: Oral (06/07 1418) BP: 128/74 mmHg (06/07 1418) Pulse Rate: 70 (06/07 1418)  Labs:  Recent Labs  05/13/14 1338 05/13/14 1618 05/13/14 1855 05/13/14 2250 05/14/14 0410  HGB 13.0  --   --   --   --   HCT 40.0  --   --   --   --   PLT 274  --   --   --   --   LABPROT  --  19.9*  --   --  19.8*  INR  --  1.75*  --   --  1.74*  CREATININE 1.47*  --   --   --  1.50*  TROPONINI  --   --  <0.30 <0.30 <0.30    Estimated Creatinine Clearance: 65.4 ml/min (by C-G formula based on Cr of 1.5).   Assessment:  12 YOM with DM and hx of DVT and afib on coumadin PTA, admitted for worsening SOB. Pharmacy currently managing coumadin and U-500 insulin. Now adding heparin bridge till INR is therapeutic.   PTA coumadin dose: 4mg  on MWF, 6mg  on STTS (last dose 6/5)  Per patient he takes insulin 3-4 times daily with CBG checking. Usually he inject 300 units (0.6 ml) with major meals if his CBG is > 200, and 100 units (0.2 ml) with a snack. If his CBG is between 100 - 200, then he gives himself half of the dose. If CBG < 100, then no insulin. He is currently on heart healthy/carb modified diet. CBG 150 on ACHS cbg checks.   Goal of Therapy:  INR 2-3 Heparin level 0.3 - 0.7  Monitor platelets by anticoagulation protocol: Yes - Insulin U500 3 times daily with meal and with CBG check,  if CBG > 200, give 300 units (0.6 ml);  if CBG 100 - 200, give 150 units (0.44ml);  if CBG < 100, do not give U500   Plan:  - Start heparin infusion at 1500 units/hr. No bolus  - F/u  HL in 8 hours  - Daily PT/INR, HL, CBC  - CBG per RN was 100. Give 150 units (0.3 mL) of Insulin U-500  - f/u CBGs - Stop heparin when INR > 2   Albertina Parr, PharmD.  Clinical Pharmacist Pager [phone]

## 2014-05-14 NOTE — Progress Notes (Signed)
Pt had a change of rhythm ECG showed A-flutter with a 4:1 AV conduction with R BBB with T wave abnormality, no S/S , MD notified, will continue to monitor, Thanks, Arvella Nigh RN

## 2014-05-14 NOTE — Progress Notes (Signed)
[facility] PROGRESS NOTE  Filed Weights   05/13/14 1335 05/13/14 1710 05/14/14 0515  Weight: 145.151 kg (320 lb) 145.1 kg (319 lb 14.2 oz) 142.928 kg (315 lb 1.6 oz)        Intake/Output Summary (Last 24 hours) at 05/14/14 1703 Last data filed at 05/14/14 1319  Gross per 24 hour  Intake    813 ml  Output   4777 ml  Net  -3964 ml     Assessment/Plan: 1-Shortness of breath due to  Acute on chronic Diastolic CHF: good diuresis with IV lasix. -patient breathing better at rest; but still with findings of fluid overload on exam -patient also with symptomatic uncontrolled a. Fib  -will continue IV lasix, low sodium, strict I's and O's and daily weight -will consult cardiology and follow further rec's (might benefit of cardioversion)  2-CKD (chronic kidney disease): stage 3 at baseline -stable -will follow trend with diuresis  3-Depressive disorder, not elsewhere classified: stable. Not on any meds at home.  4-Type I (juvenile type) diabetes mellitus with renal manifestations, uncontrolled: continue current insulin therapy  5-OBSTRUCTIVE SLEEP APNEA: continue CPAP  6-OBESITY-MORBID (>100'): discussion about low calorie diet sustained with patient  7-hypothyroidism: continue synthroid  8-GERD: continue PPI    Code Status: Full Family Communication: wife at bedside  Disposition Plan: home when medically stable   Consultants:  Cardiology   Procedures: ECHO: pending  Antibiotics:  None   HPI/Subjective: Patient reports feeling better; but still SOB with minimal exertion and complaining of heart racing sensation (at rest and with minimal activity). Afebrile, denies CP  Objective: Filed Vitals:   05/14/14 0022 05/14/14 0515 05/14/14 1056 05/14/14 1418  BP: 105/50 123/64 176/69 128/74  Pulse:  66 69 70  Temp: 98.1 F (36.7 C) 98 F (36.7 C)  97.9 F (36.6 C)  TempSrc: Oral Oral  Oral  Resp: 20 20 20 20   Height:      Weight:  142.928 kg (315 lb 1.6  oz)    SpO2: 97% 99%  99%     Exam:  General: Alert, awake, oriented x3, reports difficulty breathing on exertion and heart racing sensation HEENT: No bruits, no goiter. Positive JVD Heart: irregular, no rubs or gallops Lungs: fine crackles at bases, no wheezing Abdomen: Soft, nontender, nondistended, positive bowel sounds.  Neuro: Grossly intact, nonfocal.    Data Reviewed: Basic Metabolic Panel:  Recent Labs Lab 05/13/14 1338 05/14/14 0410  NA 137 140  K 4.5 4.1  CL 99 100  CO2 21 25  GLUCOSE 332* 175*  BUN 28* 32*  CREATININE 1.47* 1.50*  CALCIUM 9.1 9.1   CBC:  Recent Labs Lab 05/13/14 1338  WBC 11.5*  HGB 13.0  HCT 40.0  MCV 93.0  PLT 274   Cardiac Enzymes:  Recent Labs Lab 05/13/14 1855 05/13/14 2250 05/14/14 0410  TROPONINI <0.30 <0.30 <0.30   BNP (last 3 results)  Recent Labs  10/29/13 0447 11/09/13 1359 05/13/14 1338  PROBNP 1362.0* 64.0 2142.0*   CBG:  Recent Labs Lab 05/13/14 2032 05/13/14 2147 05/14/14 0613 05/14/14 1121 05/14/14 1632  GLUCAP 79 106* 250* 326* 100*    Recent Results (from the past 240 hour(s))  MRSA PCR SCREENING     Status: Abnormal   Collection Time    05/13/14  7:02 PM      Result Value Ref Range Status   MRSA by PCR POSITIVE (*) NEGATIVE Final   Comment:  The GeneXpert MRSA Assay (FDA     approved for NASAL specimens     only), is one component of a     comprehensive MRSA colonization     surveillance program. It is not     intended to diagnose MRSA     infection nor to guide or     monitor treatment for     MRSA infections.     RESULT CALLED TO, READ BACK BY AND VERIFIED WITH:     FUTRELL,M RN 05/13/14 2044 WOOTEN,K     Studies: Dg Chest 2 View  05/13/2014   CLINICAL DATA:  Shortness of Breath  EXAM: CHEST  2 VIEW  COMPARISON:  January 03, 2014  FINDINGS: There is underlying emphysematous change. There is no edema or consolidation. The heart size is normal. Pulmonary vascularity  reflects underlying emphysematous change in is stable. Pacemaker leads are attached to the right atrium and right ventricle. No adenopathy. There is degenerative change in the thoracic spine.  IMPRESSION: Underlying emphysematous change.  No edema or consolidation.   Electronically Signed   By: Lowella Grip M.D.   On: 05/13/2014 15:07    Scheduled Meds: . acidophilus  1 capsule Oral Daily  . aspirin  325 mg Oral Daily  . atorvastatin  40 mg Oral q1800  . brimonidine  1 drop Left Eye TID  . Chlorhexidine Gluconate Cloth  6 each Topical Q0600  . cholecalciferol  1,000 Units Oral Daily  . dorzolamide-timolol  1 drop Both Eyes BID  . [START ON 05/15/2014] fludrocortisone  0.1 mg Oral Weekly  . furosemide  40 mg Intravenous BID  . insulin aspart  0-5 Units Subcutaneous QHS  . insulin regular human CONCENTRATED  0-300 Units Subcutaneous TID WC  . insulin regular human CONCENTRATED  150 Units Subcutaneous Once  . latanoprost  1 drop Both Eyes QHS  . levothyroxine  50 mcg Oral QAC breakfast  . multivitamin with minerals  1 tablet Oral Daily  . mupirocin ointment  1 application Nasal BID  . pantoprazole  40 mg Oral Q breakfast  . sodium chloride  3 mL Intravenous Q12H  . vitamin C  500 mg Oral Daily  . warfarin  7.5 mg Oral ONCE-1800  . Warfarin - Pharmacist Dosing Inpatient   Does not apply q1800   Continuous Infusions:   Time >30 minutes  Barton Dubois  [facility] Pager [phone]. If 8PM-8AM, please contact night-coverage at [email or web address], password [facility] 05/14/2014, 5:03 PM  LOS: 1 day     **Disclaimer: This note may have been dictated with voice recognition software. Similar sounding words can inadvertently be transcribed and this note may contain transcription errors which may not have been corrected upon publication of note.**

## 2014-05-14 NOTE — Progress Notes (Signed)
ANTICOAGULATION CONSULT NOTE - Follow-Up Consult  Pharmacy Consult for Coumadin  Indication: DVT , afib  Allergies  Allergen Reactions  . Pioglitazone Swelling    Patient Measurements: Height: 5\' 11"  (180.3 cm) Weight: 315 lb 1.6 oz (142.928 kg) (scale A) IBW/kg (Calculated) : 75.3  Labs:  Recent Labs  05/13/14 1338 05/13/14 1618 05/13/14 1855 05/13/14 2250 05/14/14 0410  HGB 13.0  --   --   --   --   HCT 40.0  --   --   --   --   PLT 274  --   --   --   --   LABPROT  --  19.9*  --   --  19.8*  INR  --  1.75*  --   --  1.74*  CREATININE 1.47*  --   --   --  1.50*  TROPONINI  --   --  <0.30 <0.30 <0.30    Estimated Creatinine Clearance: 65.4 ml/min (by C-G formula based on Cr of 1.5).   Medical History: Past Medical History  Diagnosis Date  . Diabetes mellitus   . COPD (chronic obstructive pulmonary disease)     CPAP  . Renal insufficiency   . Hyperlipemia   . Other and unspecified coagulation defects   . Hypothyroidism   . Hyperkalemia   . 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 Sick sinus syndrome     s/p PPM by JA  . Pancreatitis   . Obesity   . Constipation   . OSA on CPAP     using CPAP although it is hard for him to tolerate  . SOB (shortness of breath)   . CAD (coronary artery disease)     not much  . Anginal pain     at times  . Hypertension     occasional  . Pacemaker   . Depression     recently  . Anxiety   . H/O Legionnaire's disease 2003  . History of blood clots     R groin  . Peripheral neuropathy   . Osteoarthritis     fingers  . GERD (gastroesophageal reflux disease)   . Anemia     hx of  . Pneumonia 2003  . Cancer 2014    bladder cancer AND RIGHT URETERAL CANCER  . CHF (congestive heart failure)     Assessment:  61 YOM with DM and hx of DVT and afib on coumadin PTA, admitted for worsening SOB. Pharmacy is consulted to manage coumadin. INR 1.74 today.  PTA coumadin dose: 4mg  on MWF, 6mg  on STTS (last dose  6/5)  Goal of Therapy:  INR 2-3 Monitor platelets by anticoagulation protocol: Yes   Plan:  - Coumadin 7.5 mg po x 1 tonight - Daily PT/INR  Uvaldo Rising, BCPS  Clinical Pharmacist Pager [phone]  05/14/2014 8:54 AM

## 2014-05-14 NOTE — Consult Note (Signed)
Reason for Consult: atrial fibrillation   Requesting Physician: [place]  Cardiologist: Allred  HPI: This is a 73 y.o. male with a past medical history significant for obstructive sleep apnea, COPD, obesity, diabetes mellitus, chronic lower extremity edema and sinus node dysfunction status post implantation of a dual-chamber permanent pacemaker, with previous episodes of paroxysmal atrial fibrillation.  Roughly 3 days ago he had a sudden onset of fatigue and worsening dyspnea as well as rapid palpitations and worsening lower extremity edema. On arrival to the hospital he was found to have atrial fibrillation rapid ventricular response and interrogation of his pacemaker shows a three-day episode of atrial fibrillation, often with rapid ventricular rates. This has been going on without interruption since 4 PM on June 4. Unfortunately his anticoagulation level is subtherapeutic with an INR of 1.7. Just a week ago his INR was 3.7 and his warfarin dosage was adjusted downward. He feels better after treatment with diuretics and oxygen.  PMHx:  Past Medical History  Diagnosis Date  . Diabetes mellitus   . COPD (chronic obstructive pulmonary disease)     CPAP  . Renal insufficiency   . Hyperlipemia   . Other and unspecified coagulation defects   . Hypothyroidism   . Hyperkalemia   . 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 Sick sinus syndrome     s/p PPM by JA  . Pancreatitis   . Obesity   . Constipation   . OSA on CPAP     using CPAP although it is hard for him to tolerate  . SOB (shortness of breath)   . CAD (coronary artery disease)     not much  . Anginal pain     at times  . Hypertension     occasional  . Pacemaker   . Depression     recently  . Anxiety   . H/O Legionnaire's disease 2003  . History of blood clots     R groin  . Peripheral neuropathy   . Osteoarthritis     fingers  . GERD (gastroesophageal reflux disease)   . Anemia     hx of  .  Pneumonia 2003  . Cancer 2014    bladder cancer AND RIGHT URETERAL CANCER  . CHF (congestive heart failure)    Past Surgical History  Procedure Laterality Date  . Nasal septum surgery  1967  . Pacemaker placement  06/2010    [facility] Acc[employer] RF DR, Model [id number] ( Serial number [id number])  . Thromboembolectomy and four compartment fasciotomy  2009    GROIN AREA  . Cardiac catheterization    . Cataract extraction Right   . Cholecystectomy  2012  . Insert / replace / remove pacemaker  2011  . Transurethral resection of bladder tumor with gyrus (turbt-gyrus) N/A 10/26/2013    Procedure: TRANSURETHRAL RESECTION OF BLADDER TUMOR WITH GYRUS (TURBT-GYRUS);  Surgeon: Alexis Frock, MD;  Location: [facility];  Service: Urology;  Laterality: N/A;  . Cystoscopy w/ ureteral stent placement Right 10/26/2013    Procedure: CYSTOSCOPY WITH RETROGRADE PYELOGRAM/URETERAL STENT PLACEMENT;  Surgeon: Alexis Frock, MD;  Location: [facility];  Service: Urology;  Laterality: Right;  . Embolectomy Left 11/02/2013    Procedure: LEFT FEMORAL EMBOLECTOMY, LEFT FEMORAL ARTERY ENDARTERECTOMY WITH DACRON PATCH ANGIOPLASTY.;  Surgeon: Mal Misty, MD;  Location: [facility];  Service: Vascular;  Laterality: Left;  . Transurethral resection of bladder tumor with gyrus (turbt-gyrus) N/A  01/11/2014    Procedure: TRANSURETHRAL RESECTION OF BLADDER TUMOR WITH GYRUS (TURBT-GYRUS);  Surgeon: Alexis Frock, MD;  Location: [facility];  Service: Urology;  Laterality: N/A;  . Cystoscopy with ureteroscopy and stent placement Right 01/11/2014    Procedure: CYSTOSCOPY WITH URETEROSCOPY ,RIGHT RETROGRADE AND STENT CHANGE AND LASER OF URETERAL TUMOR;  Surgeon: Alexis Frock, MD;  Location: [facility];  Service: Urology;  Laterality: Right;  . Holmium laser application Right 06/15/8920    Procedure: HOLMIUM LASER APPLICATION;  Surgeon: Alexis Frock, MD;  Location: [facility];  Service: Urology;  Laterality: Right;    FAMHx: Family History  Problem  Relation Age of Onset  . Liver cancer Mother     deceased age 56  . Cancer Mother     liver cancer  . Colon cancer Neg Hx   . Heart attack Father     SOCHx:  reports that he quit smoking about 7 years ago. His smoking use included Cigarettes. He has a 100 pack-year smoking history. He has never used smokeless tobacco. He reports that he does not drink alcohol or use illicit drugs.  ALLERGIES: Allergies  Allergen Reactions  . Pioglitazone Swelling    ROS:  The patient specifically denies any chest pain at rest or with exertion, dyspnea at rest, orthopnea, paroxysmal nocturnal dyspnea, syncope, palpitations, focal neurological deficits, intermittent claudication, lower extremity edema, unexplained weight gain, cough, hemoptysis or wheezing.  The patient also denies abdominal pain, nausea, vomiting, dysphagia, diarrhea, constipation, polyuria, polydipsia, dysuria, hematuria, frequency, urgency, abnormal bleeding or bruising, fever, chills, unexpected weight changes, mood swings, change in skin or hair texture, change in voice quality, auditory or visual problems, allergic reactions or rashes, new musculoskeletal complaints other than usual "aches and pains".    HOME MEDICATIONS: Prescriptions prior to admission  Medication Sig Dispense Refill  . acetaminophen (TYLENOL) 500 MG tablet Take 500 mg by mouth 2 (two) times daily as needed for moderate pain (pain).       Marland Kitchen albuterol (PROAIR HFA) 108 (90 BASE) MCG/ACT inhaler Inhale 2 puffs into the lungs every 6 (six) hours as needed for wheezing or shortness of breath.      . Ascorbic Acid (VITAMIN C) 500 MG tablet Take 500 mg by mouth daily.       Marland Kitchen aspirin 325 MG tablet Take 325 mg by mouth daily.       . brimonidine (ALPHAGAN P) 0.1 % SOLN Place 1 drop into the left eye 3 (three) times daily.       . calcium carbonate (TUMS - DOSED IN MG ELEMENTAL CALCIUM) 500 MG chewable tablet Chew 2 tablets by mouth 2 (two) times daily as needed for  indigestion or heartburn.      . cholecalciferol (VITAMIN D) 1000 UNITS tablet Take 1,000 Units by mouth daily.      Marland Kitchen dextromethorphan (DELSYM) 30 MG/5ML liquid Take 90 mg by mouth 2 (two) times daily as needed for cough.      . dorzolamide-timolol (COSOPT) 22.3-6.8 MG/ML ophthalmic solution Place 1 drop into both eyes 2 (two) times daily.       . fludrocortisone (FLORINEF) 0.1 MG tablet Take 0.1 mg by mouth once a week. On Mondays      . fluticasone (FLONASE) 50 MCG/ACT nasal spray Place 2 sprays into both nostrils daily as needed for allergies or rhinitis.      . furosemide (LASIX) 40 MG tablet Take 20 mg by mouth daily.       Marland Kitchen guaifenesin (HUMIBID  E) 400 MG TABS tablet Take 400 mg by mouth 2 (two) times daily as needed (congestion).       . insulin regular human CONCENTRATED (HUMULIN R) 500 UNIT/ML SOLN injection Inject 100-300 Units into the skin 4 (four) times daily -  with meals and at bedtime. Inject into the skin 3 times daily (blood sugar > 200 = 300 units (drawn up as 60 units), blood sugar 100 - 200 = 150 units (drawn up as 30 units), blood sugar <100 = no insulin with meals and 100 units (drawn up as 20 units) with bedtime snack .      Marland Kitchen Lactobacillus (ACIDOPHILUS) CAPS capsule Take 1 capsule by mouth daily.      Marland Kitchen latanoprost (XALATAN) 0.005 % ophthalmic solution Place 1 drop into both eyes at bedtime.      Marland Kitchen levothyroxine (SYNTHROID, LEVOTHROID) 50 MCG tablet Take 50 mcg by mouth daily before breakfast.      . Multiple Vitamin (MULTIVITAMIN WITH MINERALS) TABS tablet Take 1 tablet by mouth daily.      . Omega-3 Fatty Acids (FISH OIL) 1000 MG CAPS Take 1,000 mg by mouth daily.      Marland Kitchen omeprazole (PRILOSEC) 20 MG capsule Take 20 mg by mouth daily.      . rosuvastatin (CRESTOR) 40 MG tablet Take 0.5 tablets (20 mg total) by mouth every evening.  30 tablet  2  . traMADol (ULTRAM) 50 MG tablet Take 100 mg by mouth 2 (two) times daily as needed (pain).      Marland Kitchen warfarin (COUMADIN) 4 MG tablet  Take 4-6 mg by mouth at bedtime. Take 1 tablet (4 mg) on Monday, Wednesday and Friday, take 1 1/2 tablet (6 mg) on Sunday, Tuesday, Thursday, Saturday        HOSPITAL MEDICATIONS: I have reviewed the patient's current medications. Prior to Admission:  Prescriptions prior to admission  Medication Sig Dispense Refill  . acetaminophen (TYLENOL) 500 MG tablet Take 500 mg by mouth 2 (two) times daily as needed for moderate pain (pain).       Marland Kitchen albuterol (PROAIR HFA) 108 (90 BASE) MCG/ACT inhaler Inhale 2 puffs into the lungs every 6 (six) hours as needed for wheezing or shortness of breath.      . Ascorbic Acid (VITAMIN C) 500 MG tablet Take 500 mg by mouth daily.       Marland Kitchen aspirin 325 MG tablet Take 325 mg by mouth daily.       . brimonidine (ALPHAGAN P) 0.1 % SOLN Place 1 drop into the left eye 3 (three) times daily.       . calcium carbonate (TUMS - DOSED IN MG ELEMENTAL CALCIUM) 500 MG chewable tablet Chew 2 tablets by mouth 2 (two) times daily as needed for indigestion or heartburn.      . cholecalciferol (VITAMIN D) 1000 UNITS tablet Take 1,000 Units by mouth daily.      Marland Kitchen dextromethorphan (DELSYM) 30 MG/5ML liquid Take 90 mg by mouth 2 (two) times daily as needed for cough.      . dorzolamide-timolol (COSOPT) 22.3-6.8 MG/ML ophthalmic solution Place 1 drop into both eyes 2 (two) times daily.       . fludrocortisone (FLORINEF) 0.1 MG tablet Take 0.1 mg by mouth once a week. On Mondays      . fluticasone (FLONASE) 50 MCG/ACT nasal spray Place 2 sprays into both nostrils daily as needed for allergies or rhinitis.      . furosemide (LASIX) 40 MG  tablet Take 20 mg by mouth daily.       Marland Kitchen guaifenesin (HUMIBID E) 400 MG TABS tablet Take 400 mg by mouth 2 (two) times daily as needed (congestion).       . insulin regular human CONCENTRATED (HUMULIN R) 500 UNIT/ML SOLN injection Inject 100-300 Units into the skin 4 (four) times daily -  with meals and at bedtime. Inject into the skin 3 times daily (blood  sugar > 200 = 300 units (drawn up as 60 units), blood sugar 100 - 200 = 150 units (drawn up as 30 units), blood sugar <100 = no insulin with meals and 100 units (drawn up as 20 units) with bedtime snack .      Marland Kitchen Lactobacillus (ACIDOPHILUS) CAPS capsule Take 1 capsule by mouth daily.      Marland Kitchen latanoprost (XALATAN) 0.005 % ophthalmic solution Place 1 drop into both eyes at bedtime.      Marland Kitchen levothyroxine (SYNTHROID, LEVOTHROID) 50 MCG tablet Take 50 mcg by mouth daily before breakfast.      . Multiple Vitamin (MULTIVITAMIN WITH MINERALS) TABS tablet Take 1 tablet by mouth daily.      . Omega-3 Fatty Acids (FISH OIL) 1000 MG CAPS Take 1,000 mg by mouth daily.      Marland Kitchen omeprazole (PRILOSEC) 20 MG capsule Take 20 mg by mouth daily.      . rosuvastatin (CRESTOR) 40 MG tablet Take 0.5 tablets (20 mg total) by mouth every evening.  30 tablet  2  . traMADol (ULTRAM) 50 MG tablet Take 100 mg by mouth 2 (two) times daily as needed (pain).      Marland Kitchen warfarin (COUMADIN) 4 MG tablet Take 4-6 mg by mouth at bedtime. Take 1 tablet (4 mg) on Monday, Wednesday and Friday, take 1 1/2 tablet (6 mg) on Sunday, Tuesday, Thursday, Saturday       Scheduled: . acidophilus  1 capsule Oral Daily  . aspirin  325 mg Oral Daily  . atorvastatin  40 mg Oral q1800  . brimonidine  1 drop Left Eye TID  . Chlorhexidine Gluconate Cloth  6 each Topical Q0600  . cholecalciferol  1,000 Units Oral Daily  . dorzolamide-timolol  1 drop Both Eyes BID  . [START ON 05/15/2014] fludrocortisone  0.1 mg Oral Weekly  . furosemide  40 mg Intravenous BID  . insulin aspart  0-5 Units Subcutaneous QHS  . insulin regular human CONCENTRATED  0-300 Units Subcutaneous TID WC  . insulin regular human CONCENTRATED  150 Units Subcutaneous Once  . latanoprost  1 drop Both Eyes QHS  . levothyroxine  50 mcg Oral QAC breakfast  . multivitamin with minerals  1 tablet Oral Daily  . mupirocin ointment  1 application Nasal BID  . pantoprazole  40 mg Oral Q breakfast    . sodium chloride  3 mL Intravenous Q12H  . vitamin C  500 mg Oral Daily  . warfarin  7.5 mg Oral ONCE-1800  . Warfarin - Pharmacist Dosing Inpatient   Does not apply q1800    VITALS: Blood pressure 128/74, pulse 70, temperature 97.9 F (36.6 C), temperature source Oral, resp. rate 20, height 5\' 11"  (1.803 m), weight 315 lb 1.6 oz (142.928 kg), SpO2 99.00%.  PHYSICAL EXAM:  General: Alert, oriented x3, no distress, morbid obesity Head: no evidence of trauma, PERRL, EOMI, no exophtalmos or lid lag, no myxedema, no xanthelasma; normal ears, nose and oropharynx Neck: Unable to see the jugular venous pulsations and no hepatojugular reflux; brisk  carotid pulses without delay and no carotid bruits Chest: clear to auscultation, no signs of consolidation by percussion or palpation, normal fremitus, symmetrical and full respiratory excursions Cardiovascular: Unable to identify the apical impulse, regular rhythm, normal first heart sound and widely split second heart sound, no rubs or gallops, no murmur Abdomen: no tenderness or distention, no masses by palpation, no abnormal pulsatility or arterial bruits, normal bowel sounds, no hepatosplenomegaly Extremities: no clubbing, cyanosis;  2+ symmetrical ankle edema; 2+ radial, ulnar and brachial pulses bilaterally; 2+ right femoral, posterior tibial and dorsalis pedis pulses; 2+ left femoral, posterior tibial and dorsalis pedis pulses; no subclavian or femoral bruits Neurological: grossly nonfocal   LABS  CBC  Recent Labs  05/13/14 1338  WBC 11.5*  HGB 13.0  HCT 40.0  MCV 93.0  PLT 401   Basic Metabolic Panel  Recent Labs  05/13/14 1338 05/14/14 0410  NA 137 140  K 4.5 4.1  CL 99 100  CO2 21 25  GLUCOSE 332* 175*  BUN 28* 32*  CREATININE 1.47* 1.50*  CALCIUM 9.1 9.1   Liver Function Tests No results found for this basename: AST, ALT, ALKPHOS, BILITOT, PROT, ALBUMIN,  in the last 72 hours No results found for this basename:  LIPASE, AMYLASE,  in the last 72 hours Cardiac Enzymes  Recent Labs  05/13/14 1855 05/13/14 2250 05/14/14 0410  TROPONINI <0.30 <0.30 <0.30   IMAGING: Dg Chest 2 View  05/13/2014   CLINICAL DATA:  Shortness of Breath  EXAM: CHEST  2 VIEW  COMPARISON:  January 03, 2014  FINDINGS: There is underlying emphysematous change. There is no edema or consolidation. The heart size is normal. Pulmonary vascularity reflects underlying emphysematous change in is stable. Pacemaker leads are attached to the right atrium and right ventricle. No adenopathy. There is degenerative change in the thoracic spine.  IMPRESSION: Underlying emphysematous change.  No edema or consolidation.   Electronically Signed   By: Lowella Grip M.D.   On: 05/13/2014 15:07    ECG: Atrial fibrillation, right bundle branch block (old), intermittent ventricular pacing  TELEMETRY:  atrial fibrillation, most with controlled ventricular rate, sometimes ventricular rates over 120 beats per minute  Echo November 2014 shows normal left ventricle size with moderate LVH and ejection fraction of 55-60%, mild mitral dilatation, mild aortic valve stenosis. No comment is made regarding diastolic function, but the recorded the Doppler velocity suggested abnormal filling, consistent with moderate diastolic dysfunction  Pacemaker interrogation ([employer] accent DR RF) performed by me shows normal device function. Automatic mode switch began on June 4 at 4:20 PM and has continued unabated since then. A few other brief episodes of mode switch represent paroxysmal atrial tachycardia, some are simply competitive sensor driven pacing.  IMPRESSION: 1. Symptomatic atrial fibrillation, complicated by acute on chronic diastolic heart failure 2. Morbid obesity 3. Obstructive sleep apnea 4. Diabetes mellitus insulin requiring  RECOMMENDATION: 1. He will benefit from synchronized cardioversion but this cannot be safely performed without first ensuring  that he does not have a left atrial thrombus with transesophageal echocardiography. We'll try to schedule both those procedures tomorrow. The risks and benefits of those procedures and moderate sedation were discussed in detail with the patient and his wife. He wishes to proceed as soon as possible. 2. He is at relatively high risk of arrhythmia recurrence and will probably benefit from antiarrhythmic therapy, at least in the short-term. These medications cannot be started until we are sure that he does not have a  left atrial thrombus. 3. Start intravenous heparin until INR greater than 2.0 4. We'll add beta blockers as control medications until the cardioversion  Time Spent Directly with Patient: 60 minutes  Sanda Klein, MD, [facility] [phone] office [phone] pager   05/14/2014, 4:51 PM

## 2014-05-15 ENCOUNTER — Ambulatory Visit: Payer: Medicare HMO | Admitting: Endocrinology

## 2014-05-15 DIAGNOSIS — I359 Nonrheumatic aortic valve disorder, unspecified: Secondary | ICD-10-CM

## 2014-05-15 DIAGNOSIS — E119 Type 2 diabetes mellitus without complications: Secondary | ICD-10-CM

## 2014-05-15 LAB — CBC
HEMATOCRIT: 39.8 % (ref 39.0–52.0)
Hemoglobin: 13.1 g/dL (ref 13.0–17.0)
MCH: 30.2 pg (ref 26.0–34.0)
MCHC: 32.9 g/dL (ref 30.0–36.0)
MCV: 91.7 fL (ref 78.0–100.0)
Platelets: 267 10*3/uL (ref 150–400)
RBC: 4.34 MIL/uL (ref 4.22–5.81)
RDW: 16.4 % — AB (ref 11.5–15.5)
WBC: 11.4 10*3/uL — ABNORMAL HIGH (ref 4.0–10.5)

## 2014-05-15 LAB — BASIC METABOLIC PANEL
BUN: 38 mg/dL — ABNORMAL HIGH (ref 6–23)
CHLORIDE: 98 meq/L (ref 96–112)
CO2: 24 meq/L (ref 19–32)
CREATININE: 1.54 mg/dL — AB (ref 0.50–1.35)
Calcium: 9.5 mg/dL (ref 8.4–10.5)
GFR calc Af Amer: 51 mL/min — ABNORMAL LOW (ref >90)
GFR calc non Af Amer: 44 mL/min — ABNORMAL LOW (ref >90)
Glucose, Bld: 96 mg/dL (ref 70–99)
Potassium: 3.7 mEq/L (ref 3.7–5.3)
Sodium: 139 mEq/L (ref 137–147)

## 2014-05-15 LAB — GLUCOSE, CAPILLARY
Glucose-Capillary: 66 mg/dL — ABNORMAL LOW (ref 70–99)
Glucose-Capillary: 108 mg/dL — ABNORMAL HIGH (ref 70–99)
Glucose-Capillary: 240 mg/dL — ABNORMAL HIGH (ref 70–99)
Glucose-Capillary: 233 mg/dL — ABNORMAL HIGH (ref 70–99)
Glucose-Capillary: 314 mg/dL — ABNORMAL HIGH (ref 70–99)

## 2014-05-15 LAB — HEPARIN LEVEL (UNFRACTIONATED)
Heparin Unfractionated: 0.12 IU/mL — ABNORMAL LOW (ref 0.30–0.70)
Heparin Unfractionated: 0.26 IU/mL — ABNORMAL LOW (ref 0.30–0.70)
Heparin Unfractionated: 0.3 IU/mL (ref 0.30–0.70)

## 2014-05-15 LAB — HEMOGLOBIN A1C
HEMOGLOBIN A1C: 7.3 % — AB (ref <5.7)
MEAN PLASMA GLUCOSE: 163 mg/dL — AB (ref <117)

## 2014-05-15 LAB — PROTIME-INR
INR: 1.91 — ABNORMAL HIGH (ref 0.00–1.49)
PROTHROMBIN TIME: 21.3 s — AB (ref 11.6–15.2)

## 2014-05-15 MED ORDER — DEXTROSE 50 % IV SOLN
INTRAVENOUS | Status: AC
  Administered 2014-05-15: 25 mL
  Filled 2014-05-15: qty 50

## 2014-05-15 MED ORDER — OXYMETAZOLINE HCL 0.05 % NA SOLN
1.0000 | Freq: Two times a day (BID) | NASAL | Status: DC | PRN
  Filled 2014-05-15: qty 15

## 2014-05-15 MED ORDER — INSULIN ASPART 100 UNIT/ML ~~LOC~~ SOLN
0.0000 [IU] | Freq: Three times a day (TID) | SUBCUTANEOUS | Status: DC
  Administered 2014-05-15 (×2): 5 [IU] via SUBCUTANEOUS
  Administered 2014-05-16 (×2): 15 [IU] via SUBCUTANEOUS

## 2014-05-15 MED ORDER — SODIUM CHLORIDE 0.9 % IV SOLN: INTRAVENOUS | Status: DC

## 2014-05-15 MED ORDER — INSULIN GLARGINE 100 UNIT/ML ~~LOC~~ SOLN
30.0000 [IU] | Freq: Two times a day (BID) | SUBCUTANEOUS | Status: DC
  Administered 2014-05-15 – 2014-05-16 (×3): 30 [IU] via SUBCUTANEOUS
  Filled 2014-05-15 (×4): qty 0.3

## 2014-05-15 MED ORDER — INSULIN ASPART 100 UNIT/ML ~~LOC~~ SOLN
10.0000 [IU] | Freq: Three times a day (TID) | SUBCUTANEOUS | Status: DC
  Administered 2014-05-15 – 2014-05-16 (×3): 10 [IU] via SUBCUTANEOUS

## 2014-05-15 MED ORDER — WARFARIN SODIUM 7.5 MG PO TABS
7.5000 mg | ORAL_TABLET | Freq: Once | ORAL | Status: AC
  Administered 2014-05-15: 7.5 mg via ORAL
  Filled 2014-05-15: qty 1

## 2014-05-15 NOTE — Progress Notes (Signed)
ANTICOAGULATION CONSULT NOTE - Follow up [facility] for Coumadin + Heparin  Indication: DVT , afib  Allergies  Allergen Reactions  . Pioglitazone Swelling    Patient Measurements: Height: 5\' 11"  (180.3 cm) Weight: 314 lb (142.429 kg) (scale a) IBW/kg (Calculated) : 75.3 Heparin dosing weight: 108 kg   Vital Signs: Temp: 98.2 F (36.8 C) (06/08 1347) Temp src: Oral (06/08 1347) BP: 111/71 mmHg (06/08 1347) Pulse Rate: 71 (06/08 1347)  Labs:  Recent Labs  05/13/14 1338 05/13/14 1618 05/13/14 1855 05/13/14 2250 05/14/14 0410 05/15/14 0130  HGB 13.0  --   --   --   --  13.1  HCT 40.0  --   --   --   --  39.8  PLT 274  --   --   --   --  267  LABPROT  --  19.9*  --   --  19.8* 21.3*  INR  --  1.75*  --   --  1.74* 1.91*  HEPARINUNFRC  --   --   --   --   --  0.12*  CREATININE 1.47*  --   --   --  1.50* 1.54*  TROPONINI  --   --  <0.30 <0.30 <0.30  --     Estimated Creatinine Clearance: 63.5 ml/min (by C-G formula based on Cr of 1.54).   Assessment:   6 YOM with DM and hx of DVT and afib on coumadin PTA, admitted 05/13/2014  for worsening SOB. Pharmacy currently managing heparin, coumadin and U-500 insulin. Heparin bridge till INR is therapeutic.   PTA coumadin dose: 4mg  on MWF, 6mg  on STTS (last dose 6/5)  Coag: First HL is low at 0.12 after drip started at 1500 units/hr with no bolus.  INR 1.91 after 7.5 mg given last night.  CBC stable, no bleeding reported.   Endo: T1DM, hypoglycemic event after NPO for procedure that was rescheduled for 6/9, U500 DCd and changed to Lantus/SSI understanding glucoses will drift up but while NPO pending procedure to avoid hypoglycemia  PTA U500. Per patient he takes insulin 3-4 times daily with CBG checking. Usually he inject 300 units (0.6 ml) with major meals if his CBG is > 200, and 100 units (0.2 ml) with a snack. If his CBG is between 100 - 200, then he gives himself half of the dose. If CBG < 100, then no  insulin. He is currently on heart healthy/carb modified diet. CBG 150 on ACHS cbg checks.   Insulin U500 3 times daily with meal and with CBG check,  if CBG > 200, give 300 units (0.6 ml);  if CBG 100 - 200, give 150 units (0.27ml);  if CBG < 100, do not give U500 - d/c sliding scale insulin  Cardiovascular: Diastolic HF, has slept in recliner for years. Inc LE edema PTA. HLD, CAD, hx sick sinus s/p pacer. Diuresed 3L yesterday, on Lasix 40 IV BID. Meds: ASA, atorva, metop 25 bid,   Neurology: depression Nephrology: Hx CKD (?baseline < 2). CrCl ~ 65 ml/min Hematology / Oncology: hx bladder/ureteral ca. CBC WNL PTA Medication Issues: resumed appropriately, only on lasix 20 mg outpatient  Best Practices: warfarin  Goal of Therapy:  INR 2-3 Heparin level 0.3 - 0.7  Monitor platelets by anticoagulation protocol: Yes   Plan:  -Continue heparin infusion to 1900 units/hr, follow up HL - Daily PT/INR, HL, CBC  - Stop heparin when INR > 2  -repeat coumadin 7.5 mg po  x 1 dose today  Thank you for allowing pharmacy to be a part of this patients care team.  Rowe Robert Pharm.D., BCPS, AQ-Cardiology Clinical Pharmacist 05/15/2014 2:05 PM Pager: [phone] Phone: [phone]

## 2014-05-15 NOTE — Progress Notes (Signed)
RT set up and placed patient on CPAP auto mode. Max 20, min 4 with a large mask. Patient tolerating well and instructed to notify respiratory if any problems arise.

## 2014-05-15 NOTE — Progress Notes (Addendum)
Patient: [name] Date of Encounter: 05/15/2014, 7:55 AM Admit date: 05/13/2014     Subjective  [name] had hypoglycemia last night. He has no other new complaints this AM. His SOB is improving. Palpitations still present, worse with activity. No CP.   Objective  Physical Exam: Vitals: BP 121/67  Pulse 73  Temp(Src) 98 F (36.7 C) (Oral)  Resp 18  Ht 5\' 11"  (1.803 m)  Wt 314 lb (142.429 kg)  BMI 43.81 kg/m2  SpO2 98% General: Well developed 73 year old male in no acute distress. Neck: Supple. JVD not elevated. Lungs: Diminished breath sounds but clear bilaterally to auscultation without wheezes, rales, or rhonchi. Breathing is unlabored. Heart: Irregular S1 S2 without murmurs, rubs, or gallops.  Abdomen: Soft, non-distended. Extremities: No clubbing or cyanosis. No edema.  Distal pedal pulses are 2+ and equal bilaterally. Neuro: Alert and oriented X 3. Moves all extremities spontaneously. No focal deficits.  Intake/Output:  Intake/Output Summary (Last 24 hours) at 05/15/14 0755 Last data filed at 05/15/14 0500  Gross per 24 hour  Intake 1095.08 ml  Output   4225 ml  Net -3129.92 ml    Inpatient Medications:  . acidophilus  1 capsule Oral Daily  . aspirin  325 mg Oral Daily  . atorvastatin  40 mg Oral q1800  . brimonidine  1 drop Left Eye TID  . Chlorhexidine Gluconate Cloth  6 each Topical Q0600  . cholecalciferol  1,000 Units Oral Daily  . dorzolamide-timolol  1 drop Both Eyes BID  . fludrocortisone  0.1 mg Oral Weekly  . furosemide  40 mg Intravenous BID  . insulin aspart  0-5 Units Subcutaneous QHS  . insulin regular human CONCENTRATED  0-300 Units Subcutaneous TID WC  . latanoprost  1 drop Both Eyes QHS  . levothyroxine  50 mcg Oral QAC breakfast  . metoprolol tartrate  25 mg Oral BID  . multivitamin with minerals  1 tablet Oral Daily  . mupirocin ointment  1 application Nasal BID  . pantoprazole  40 mg Oral Q breakfast  . sodium chloride  3 mL  Intravenous Q12H  . vitamin C  500 mg Oral Daily  . warfarin  7.5 mg Oral ONCE-1800  . Warfarin - Pharmacist Dosing Inpatient   Does not apply q1800   . sodium chloride    . dextrose 42 mL/hr at 05/15/14 0021  . heparin 1,500 Units/hr (05/14/14 1831)    Labs:  Recent Labs  05/14/14 0410 05/15/14 0130  NA 140 139  K 4.1 3.7  CL 100 98  CO2 25 24  GLUCOSE 175* 96  BUN 32* 38*  CREATININE 1.50* 1.54*  CALCIUM 9.1 9.5    Recent Labs  05/13/14 1338 05/15/14 0130  WBC 11.5* 11.4*  HGB 13.0 13.1  HCT 40.0 39.8  MCV 93.0 91.7  PLT 274 267    Recent Labs  05/13/14 1855 05/13/14 2250 05/14/14 0410  TROPONINI <0.30 <0.30 <0.30    Recent Labs  05/15/14 0130  INR 1.91*    Radiology/Studies: Dg Chest 2 View 05/13/2014   CLINICAL DATA:  Shortness of Breath  EXAM: CHEST  2 VIEW  COMPARISON:  January 03, 2014  FINDINGS: There is underlying emphysematous change. There is no edema or consolidation. The heart size is normal. Pulmonary vascularity reflects underlying emphysematous change in is stable. Pacemaker leads are attached to the right atrium and right ventricle. No adenopathy. There is degenerative change in the thoracic spine.  IMPRESSION: Underlying  emphysematous change.  No edema or consolidation.   Electronically Signed   By: Lowella Grip M.D.   On: 05/13/2014 15:07   Telemetry: persistent atrial fibrillation   Assessment and Plan  Symptomatic atrial fibrillation / atrial flutter Acute on chronic diastolic heart failure Subtherapeutic INR Sinus node dysfunction s/p PPM implant Morbid obesity Obstructive sleep apnea Diabetes mellitus  Chronic kidney disease  Plan for TEE-guided DCCV today Signed, [name] O Edmisten PA-C  I have seen, examined the patient, and reviewed the above assessment and plan.  Changes to above are made where necessary.  Pt with atypical atrial flutter since 05/11/14 by PPM interrogation which I have reviewed today.  Unfortunately he  is planned for TEE guided cardioversion due to subtherapeutic INR. Risks, benefits, and alternatives to TEE guided cardioversion were discussed in detail with the patient today.  He understands risks and wishes to proceed with schedule will allow. I have strongly encouraged that he switch to a novel anticoagulant due to his labile INRs and risks associated with this.  Presently, he does not wish to consider noval anticoagulation due to costs associated with this.  I will consult case management to see what his costs would be long term for either xarelto or eliquis. As this is his first episode of atrial arrhythmias in a while, I would not intiate antiarrhythmic therapy at this time.  IF he has further atrial arrhythmias then our options would be sotalol, tikosyn, or amiodarone.  Given his concern for costs, tikosyn may not be an option. Continue IV heparin and coumadin at this time.  Cardioversion to be arranged. Watch glucose closely while NPO given hypoglycemia this am. Compliance with cpap encouraged   Co Sign: [name] Grayer, MD 05/15/2014 8:27 AM

## 2014-05-15 NOTE — Progress Notes (Addendum)
[facility] PROGRESS NOTE  Filed Weights   05/13/14 1710 05/14/14 0515 05/15/14 0549  Weight: 145.1 kg (319 lb 14.2 oz) 142.928 kg (315 lb 1.6 oz) 142.429 kg (314 lb)        Intake/Output Summary (Last 24 hours) at 05/15/14 2233 Last data filed at 05/15/14 2032  Gross per 24 hour  Intake 1595.61 ml  Output   3300 ml  Net -1704.39 ml     Assessment/Plan: 1-Shortness of breath due to  Acute on chronic Diastolic CHF: good diuresis with IV lasix. -patient breathing better at rest; but still with findings of fluid overload on exam -patient also with symptomatic uncontrolled a. Fib  -will continue IV lasix, low sodium, strict I's and O's and daily weight -cardiology on board and plan is for cardioversion and TEE on 6/9  2-CKD (chronic kidney disease): stage 3 at baseline -stable -will follow trend with diuresis  3-Depressive disorder, not elsewhere classified: stable. Not on any meds at home. -recommends follow up with PCP and psych evaluation if needed at discharge  4-Type I (juvenile type) diabetes mellitus with renal manifestations, uncontrolled: due to episodes of hypoglycemia and need for NPO status for procedures in am. Will start lantus and SSI while inpatient. Resume concentrated insulin at discharge.  5-OBSTRUCTIVE SLEEP APNEA: continue CPAP QHS  6-OBESITY-MORBID (>100'): discussion about low calorie diet sustained with patient  7-hypothyroidism: continue synthroid  8-GERD: continue PPI  9-tongue bleeding: will use direct pressure and afrin.    Code Status: Full Family Communication: wife at bedside  Disposition Plan: home when medically stable   Consultants:  Cardiology   Procedures: ECHO: pending  Antibiotics:  None   HPI/Subjective: Patient reports feeling better. Alert, awake, oriented x3, reports heart racing sensation continues. Patient bit his tongue while eating and is having mild bleeding.  Objective: Filed Vitals:   05/15/14  1347 05/15/14 1722 05/15/14 2031 05/15/14 2152  BP: 111/71 149/81 99/58 132/77  Pulse: 71 71 74 72  Temp: 98.2 F (36.8 C) 97.3 F (36.3 C) 98.4 F (36.9 C)   TempSrc: Oral Oral Oral   Resp: 18 18 18    Height:      Weight:      SpO2: 98% 98% 100%      Exam:  General: Alert, awake, oriented x3, reports heart racing sensation continues. Patient bit his tongue while eating and is having mild bleeding. Reports breathing is better. HEENT: No bruits, no goiter. Positive JVD Heart: irregular, no rubs or gallops Lungs: fine crackles at bases, no wheezing Abdomen: Soft, nontender, nondistended, positive bowel sounds.  Neuro: Grossly intact, nonfocal.    Data Reviewed: Basic Metabolic Panel:  Recent Labs Lab 05/13/14 1338 05/14/14 0410 05/15/14 0130  NA 137 140 139  K 4.5 4.1 3.7  CL 99 100 98  CO2 21 25 24   GLUCOSE 332* 175* 96  BUN 28* 32* 38*  CREATININE 1.47* 1.50* 1.54*  CALCIUM 9.1 9.1 9.5   CBC:  Recent Labs Lab 05/13/14 1338 05/15/14 0130  WBC 11.5* 11.4*  HGB 13.0 13.1  HCT 40.0 39.8  MCV 93.0 91.7  PLT 274 267   Cardiac Enzymes:  Recent Labs Lab 05/13/14 1855 05/13/14 2250 05/14/14 0410  TROPONINI <0.30 <0.30 <0.30   BNP (last 3 results)  Recent Labs  10/29/13 0447 11/09/13 1359 05/13/14 1338  PROBNP 1362.0* 64.0 2142.0*   CBG:  Recent Labs Lab 05/15/14 0622 05/15/14 0705 05/15/14 1103 05/15/14 1619 05/15/14 2026  GLUCAP 66* 108* 240* 233*  314*    Recent Results (from the past 240 hour(s))  MRSA PCR SCREENING     Status: Abnormal   Collection Time    05/13/14  7:02 PM      Result Value Ref Range Status   MRSA by PCR POSITIVE (*) NEGATIVE Final   Comment:            The GeneXpert MRSA Assay (FDA     approved for NASAL specimens     only), is one component of a     comprehensive MRSA colonization     surveillance program. It is not     intended to diagnose MRSA     infection nor to guide or     monitor treatment for      MRSA infections.     RESULT CALLED TO, READ BACK BY AND VERIFIED WITH:     FUTRELL,M RN 05/13/14 2044 WOOTEN,K     Studies: No results found.  Scheduled Meds: . acidophilus  1 capsule Oral Daily  . aspirin  325 mg Oral Daily  . atorvastatin  40 mg Oral q1800  . brimonidine  1 drop Left Eye TID  . Chlorhexidine Gluconate Cloth  6 each Topical Q0600  . cholecalciferol  1,000 Units Oral Daily  . dorzolamide-timolol  1 drop Both Eyes BID  . fludrocortisone  0.1 mg Oral Weekly  . furosemide  40 mg Intravenous BID  . insulin aspart  0-15 Units Subcutaneous TID WC  . insulin aspart  10 Units Subcutaneous TID WC  . insulin glargine  30 Units Subcutaneous BID  . latanoprost  1 drop Both Eyes QHS  . levothyroxine  50 mcg Oral QAC breakfast  . metoprolol tartrate  25 mg Oral BID  . multivitamin with minerals  1 tablet Oral Daily  . mupirocin ointment  1 application Nasal BID  . pantoprazole  40 mg Oral Q breakfast  . sodium chloride  3 mL Intravenous Q12H  . vitamin C  500 mg Oral Daily  . Warfarin - Pharmacist Dosing Inpatient   Does not apply q1800   Continuous Infusions: . sodium chloride    . heparin 2,100 Units/hr (05/15/14 1639)    Time >30 minutes  Barton Dubois  [facility] Pager [phone]. If 8PM-8AM, please contact night-coverage at [email or web address], password [facility] 05/15/2014, 10:33 PM  LOS: 2 days     **Disclaimer: This note may have been dictated with voice recognition software. Similar sounding words can inadvertently be transcribed and this note may contain transcription errors which may not have been corrected upon publication of note.**

## 2014-05-15 NOTE — Progress Notes (Signed)
Echocardiogram 2D Echocardiogram has been performed.  Alyson Locket Caty Tessler 05/15/2014, 3:26 PM

## 2014-05-15 NOTE — Progress Notes (Addendum)
Inpatient Diabetes Program Recommendations  AACE/ADA: New Consensus Statement on Inpatient Glycemic Control (2013)  Target Ranges:  Prepandial:   less than 140 mg/dL      Peak postprandial:   less than 180 mg/dL (1-2 hours)      Critically ill patients:  140 - 180 mg/dL   Results for [name], [name] (MRN [id number]) as of 05/15/2014 09:51  Ref. Range 05/14/2014 06:13 05/14/2014 11:21 05/14/2014 16:32 05/14/2014 21:21 05/14/2014 21:55 05/14/2014 22:43 05/14/2014 23:48 05/15/2014 06:22 05/15/2014 07:05  Glucose-Capillary Latest Range: 70-99 mg/dL 250 (H) 326 (H) 100 (H) 40 (LL) 42 (LL) 79 113 (H) 66 (L) 108 (H)   Diabetes history: DM2 Outpatient Diabetes medications: Humulin U500 100-300 units QID (Inject into the skin 3 times daily (blood sugar > 200 = 300 units (drawn up as 60 units), blood sugar 100 - 200 = 150 units (drawn up as 30 units), blood sugar <100 = no insulin with meals and 100 units (drawn up as 20 units) with bedtime snack . Current orders for Inpatient glycemic control: U500 0-300 units TID (with ordered parameters based on CBG) with meals and Novolog 0-5 units QHS  Inpatient Diabetes Program Recommendations Insulin - Basal: Noted hypoglycemia likely related to receiving U500 150 units on 6/7 at 17:59 (one time order) and U500 300 units at 18:01. Also noted patient will be NPO aftermidght for TEE/Cardioversion on 6/9. While inpatient and especially since patient will be NPO after midnight, please consider discontinuing U500 and ordering Lantus 28 units BID (based on 142 kg x 0.4 units). Insulin - Meal Coverage: If U500 is discontinued as recommended, please consider ordering Novolog 10 units TID with meals for meal coverage if patient eats at least 50% of meals.   05/15/14@12 :05-Talked with the patient regarding diabetes and home regimen for diabetes control.  Patient reports that he is followed by Dr. [name] for diabetes management and currently he uses Humulin U500 100-300 units QID. Patient reports  that the dose he takes depends on his blood glucose as well as the meal is has eaten. Patient states that if he does not eat a meal then he does not take insulin. At bedtime if he plans to eat a snack he takes Humulin U500; however, if he does not eat a bedtime snack then he skips bedtime dose of U500. According to the patient he checks his blood glucose several times throughout the day and his glucose ranges from 90-140 mg/dl for the most part with an occasional reading in the 200's mg/dl.  Patient states that he rarely has a low blood glucose (less than once a month). Patient expressed concern about not getting his insulin in a timely manner while being inpatient as he states that he was not getting his U500 until 1-2 hours after his meals and he normally takes it directly after eating. Explained that the U500 is kept in the pharmacy and has to be drawn up by pharmacy and sent to the floor. Patient reports that he was told that by nursing staff but he would like to get insulin directly after his eats his meal.  Informed patient I would discuss with nursing staff and with pharmacy. Explained to the patient that since he will be NPO again after midnight, insulin orders have been changed from U500 to Lantus and Novolog correction and meal coverage. Patient is agreeable to changes but wants to ensure he gets his Lantus as ordered and Novolog directly after eating. Discussed with pharmacy and nursing. Patient has been  asked to call RN after he eats and ask that his Novolog be given (correction and meal coverage) as ordered. Patient also concerned about timing of eye drops since he is on a strict time schedule with his eye drops as an outpatient. Called pharmacy and had timing changed to patient's home timing (since ordered frequency was already the same, timing just off from home schedule). Patient verbalized understanding of information discussed and reports that he does not have any further questions at this time  related to diabetes.  Thanks, Barnie Alderman, RN, MSN, CCRN Diabetes Coordinator Inpatient Diabetes Program [phone] (Team Pager) [phone] ([facility] office) [phone] [facility] office)

## 2014-05-15 NOTE — Progress Notes (Signed)
ANTICOAGULATION CONSULT NOTE - Follow up [place] for Coumadin + Heparin  Indication: DVT , afib  Allergies  Allergen Reactions  . Pioglitazone Swelling    Patient Measurements: Height: 5\' 11"  (180.3 cm) Weight: 315 lb 1.6 oz (142.928 kg) (scale A) IBW/kg (Calculated) : 75.3 Heparin dosing weight: 108 kg   Vital Signs: Temp: 97.3 F (36.3 C) (06/08 0149) Temp src: Oral (06/08 0149) BP: 123/58 mmHg (06/08 0149) Pulse Rate: 71 (06/08 0149)  Labs:  Recent Labs  05/13/14 1338 05/13/14 1618 05/13/14 1855 05/13/14 2250 05/14/14 0410 05/15/14 0130  HGB 13.0  --   --   --   --  13.1  HCT 40.0  --   --   --   --  39.8  PLT 274  --   --   --   --  267  LABPROT  --  19.9*  --   --  19.8* 21.3*  INR  --  1.75*  --   --  1.74* 1.91*  HEPARINUNFRC  --   --   --   --   --  0.12*  CREATININE 1.47*  --   --   --  1.50*  --   TROPONINI  --   --  <0.30 <0.30 <0.30  --     Estimated Creatinine Clearance: 65.4 ml/min (by C-G formula based on Cr of 1.5).   Assessment:   56 YOM with DM and hx of DVT and afib on coumadin PTA, admitted for worsening SOB. Pharmacy currently managing heparin, coumadin and U-500 insulin. Heparin bridge till INR is therapeutic.   PTA coumadin dose: 4mg  on MWF, 6mg  on STTS (last dose 6/5)  First HL is low at 0.12 after drip started at 1500 units/hr with no bolus.  INR 1.91 after 7.5 mg given last night.  CBC stable, no bleeding reported.   Goal of Therapy:  INR 2-3 Heparin level 0.3 - 0.7  Monitor platelets by anticoagulation protocol: Yes   Plan:  - increase heparin infusion to 1900 units/hr. - F/u HL in 8 hours  - Daily PT/INR, HL, CBC  - Stop heparin when INR > 2  -repeat coumadin 7.5 mg po x 1 dose today  Eudelia Bunch, Pharm.D. [phone] 05/15/2014 3:05 AM

## 2014-05-15 NOTE — Progress Notes (Signed)
ANTICOAGULATION CONSULT NOTE - Follow Up Consult  Pharmacy Consult for Heparin Indication: Afib /DVT  Allergies  Allergen Reactions  . Pioglitazone Swelling    Patient Measurements: Height: 5\' 11"  (180.3 cm) Weight: 314 lb (142.429 kg) (scale a) IBW/kg (Calculated) : 75.3 Heparin Dosing Weight: 108 kg  Vital Signs: Temp: 98.2 F (36.8 C) (06/08 1347) Temp src: Oral (06/08 1347) BP: 111/71 mmHg (06/08 1347) Pulse Rate: 71 (06/08 1347)  Labs:  Recent Labs  05/13/14 1338 05/13/14 1618 05/13/14 1855 05/13/14 2250 05/14/14 0410 05/15/14 0130 05/15/14 1457  HGB 13.0  --   --   --   --  13.1  --   HCT 40.0  --   --   --   --  39.8  --   PLT 274  --   --   --   --  267  --   LABPROT  --  19.9*  --   --  19.8* 21.3*  --   INR  --  1.75*  --   --  1.74* 1.91*  --   HEPARINUNFRC  --   --   --   --   --  0.12* 0.26*  CREATININE 1.47*  --   --   --  1.50* 1.54*  --   TROPONINI  --   --  <0.30 <0.30 <0.30  --   --     Estimated Creatinine Clearance: 63.5 ml/min (by C-G formula based on Cr of 1.54).   Assessment: 19 YOM with DM and hx of DVT and afib on coumadin PTA, admitted 05/13/2014 for worsening SOB. Pharmacy currently managing heparin, coumadin and U-500 insulin. Heparin bridge till INR is therapeutic.   Heparin level 0.12>>0.26 still slightly less than goal.  Goal of Therapy:  Heparin level 0.3-0.7 units/ml Monitor platelets by anticoagulation protocol: Yes   Plan:  Increase IV heparin to 2100 units/hr Recheck heparin level in 6 hrs.   [place] 05/15/2014,4:07 PM

## 2014-05-15 NOTE — Progress Notes (Signed)
CBG was 66 this AM, gave pt 25 ml of D50 IV since pt was NPO, rechecked CBG back to 113, will continue to monitor, Thanks, Arvella Nigh RN

## 2014-05-15 NOTE — Progress Notes (Signed)
Addendum:  Due to scheduling issues, TEE-guided DCCV will not be done today. Will resume diet and keep NPO after midnight tonight for TEE-guided DCCV tomorrow, 05/16/2014.

## 2014-05-15 NOTE — Progress Notes (Signed)
0700-1900 shift. Pt.is A/Ox4 and is hearing impaired. He is on room air and has a continuous heparin drip. He is ambulatory with 1 person standby assist. MD with [facility], Dr. Dyann Kief was paged and notified that patient bit his tongue while he was eating lunch. Pt.had a moderate amount of blood initially. MD came to assess pt. Was told to continue to monitor bleeding to see if will slow down if not then notify again. Called MD again because patient's tongue was still bleeding but bleeding had decreased. New orders were written for afrin if patient's tongue continue to bleed. Pharmacy was called to consult about use and dosage. Pt.tongue stopped bleeding on its own with no further interventions needed. Pt.'s heart rate increased to 140's to 150's non-sustained this afternoon with what looked to be a brief run of SVT while ambulating. Cardiologist provider  on call for Dr.Allred, Joellen Jersey, PA was notified. No new orders written. Was told if patient becomes symptomatic to call again. Pt.will be getting a TEE with cardioversion done in the am.

## 2014-05-16 ENCOUNTER — Encounter (HOSPITAL_COMMUNITY): Payer: Medicare HMO | Admitting: Anesthesiology

## 2014-05-16 ENCOUNTER — Inpatient Hospital Stay (HOSPITAL_COMMUNITY): Payer: Medicare HMO | Admitting: Anesthesiology

## 2014-05-16 ENCOUNTER — Encounter (HOSPITAL_COMMUNITY): Payer: Self-pay | Admitting: Anesthesiology

## 2014-05-16 ENCOUNTER — Encounter (HOSPITAL_COMMUNITY)
Admission: EM | Disposition: A | Discharge status: Home Health | Payer: Self-pay | Source: Home / Self Care | Attending: Internal Medicine

## 2014-05-16 DIAGNOSIS — I4891 Unspecified atrial fibrillation: Secondary | ICD-10-CM | POA: Diagnosis not present

## 2014-05-16 DIAGNOSIS — I059 Rheumatic mitral valve disease, unspecified: Secondary | ICD-10-CM | POA: Diagnosis not present

## 2014-05-16 DIAGNOSIS — Z79899 Other long term (current) drug therapy: Secondary | ICD-10-CM

## 2014-05-16 DIAGNOSIS — I872 Venous insufficiency (chronic) (peripheral): Secondary | ICD-10-CM

## 2014-05-16 DIAGNOSIS — R0602 Shortness of breath: Secondary | ICD-10-CM | POA: Diagnosis not present

## 2014-05-16 DIAGNOSIS — I495 Sick sinus syndrome: Secondary | ICD-10-CM

## 2014-05-16 DIAGNOSIS — N189 Chronic kidney disease, unspecified: Secondary | ICD-10-CM

## 2014-05-16 HISTORY — PX: TEE WITHOUT CARDIOVERSION: SHX5443

## 2014-05-16 HISTORY — PX: CARDIOVERSION: SHX1299

## 2014-05-16 LAB — BASIC METABOLIC PANEL
BUN: 40 mg/dL — ABNORMAL HIGH (ref 6–23)
CHLORIDE: 93 meq/L — AB (ref 96–112)
CO2: 23 mEq/L (ref 19–32)
Calcium: 9 mg/dL (ref 8.4–10.5)
Creatinine, Ser: 1.6 mg/dL — ABNORMAL HIGH (ref 0.50–1.35)
GFR calc Af Amer: 48 mL/min — ABNORMAL LOW (ref >90)
GFR calc non Af Amer: 42 mL/min — ABNORMAL LOW (ref >90)
GLUCOSE: 364 mg/dL — AB (ref 70–99)
POTASSIUM: 4.3 meq/L (ref 3.7–5.3)
Sodium: 134 mEq/L — ABNORMAL LOW (ref 137–147)

## 2014-05-16 LAB — CBC
HEMATOCRIT: 39.6 % (ref 39.0–52.0)
Hemoglobin: 12.8 g/dL — ABNORMAL LOW (ref 13.0–17.0)
MCH: 29.8 pg (ref 26.0–34.0)
MCHC: 32.3 g/dL (ref 30.0–36.0)
MCV: 92.1 fL (ref 78.0–100.0)
PLATELETS: 276 10*3/uL (ref 150–400)
RBC: 4.3 MIL/uL (ref 4.22–5.81)
RDW: 16.7 % — ABNORMAL HIGH (ref 11.5–15.5)
WBC: 11.2 10*3/uL — AB (ref 4.0–10.5)

## 2014-05-16 LAB — GLUCOSE, CAPILLARY
GLUCOSE-CAPILLARY: 382 mg/dL — AB (ref 70–99)
GLUCOSE-CAPILLARY: 403 mg/dL — AB (ref 70–99)
GLUCOSE-CAPILLARY: 409 mg/dL — AB (ref 70–99)
GLUCOSE-CAPILLARY: 375 mg/dL — AB (ref 70–99)
GLUCOSE-CAPILLARY: 460 mg/dL — AB (ref 70–99)
Glucose-Capillary: 331 mg/dL — ABNORMAL HIGH (ref 70–99)

## 2014-05-16 LAB — PROTIME-INR
INR: 2.17 — ABNORMAL HIGH (ref 0.00–1.49)
Prothrombin Time: 23.5 seconds — ABNORMAL HIGH (ref 11.6–15.2)

## 2014-05-16 SURGERY — ECHOCARDIOGRAM, TRANSESOPHAGEAL
Anesthesia: General

## 2014-05-16 MED ORDER — PROPOFOL 10 MG/ML IV BOLUS
INTRAVENOUS | Status: DC | PRN
  Administered 2014-05-16: 40 mg via INTRAVENOUS

## 2014-05-16 MED ORDER — BUTAMBEN-TETRACAINE-BENZOCAINE 2-2-14 % EX AERO
INHALATION_SPRAY | CUTANEOUS | Status: DC | PRN
Start: 2014-05-16 — End: 2014-05-16
  Administered 2014-05-16: 2 via TOPICAL

## 2014-05-16 MED ORDER — INSULIN ASPART 100 UNIT/ML ~~LOC~~ SOLN: 0.0000 [IU] | Freq: Every day | SUBCUTANEOUS | Status: DC

## 2014-05-16 MED ORDER — MENTHOL 3 MG MT LOZG
1.0000 | LOZENGE | OROMUCOSAL | Status: DC | PRN
  Filled 2014-05-16: qty 9

## 2014-05-16 MED ORDER — INSULIN GLARGINE 100 UNIT/ML ~~LOC~~ SOLN
35.0000 [IU] | Freq: Two times a day (BID) | SUBCUTANEOUS | Status: DC
  Administered 2014-05-16: 35 [IU] via SUBCUTANEOUS
  Filled 2014-05-16 (×2): qty 0.35

## 2014-05-16 MED ORDER — PROPOFOL INFUSION 10 MG/ML OPTIME
INTRAVENOUS | Status: DC | PRN
  Administered 2014-05-16: 75 ug/kg/min via INTRAVENOUS

## 2014-05-16 MED ORDER — SODIUM CHLORIDE 0.9 % IV SOLN
INTRAVENOUS | Status: DC | PRN
  Administered 2014-05-16: 12:00:00 via INTRAVENOUS

## 2014-05-16 MED ORDER — WARFARIN SODIUM 6 MG PO TABS
6.0000 mg | ORAL_TABLET | Freq: Once | ORAL | Status: AC
  Administered 2014-05-16: 6 mg via ORAL
  Filled 2014-05-16: qty 1

## 2014-05-16 MED ORDER — INSULIN ASPART 100 UNIT/ML ~~LOC~~ SOLN
0.0000 [IU] | Freq: Three times a day (TID) | SUBCUTANEOUS | Status: DC
  Administered 2014-05-17: 20 [IU] via SUBCUTANEOUS

## 2014-05-16 MED ORDER — INSULIN ASPART 100 UNIT/ML ~~LOC~~ SOLN
8.0000 [IU] | Freq: Once | SUBCUTANEOUS | Status: AC
  Administered 2014-05-16: 8 [IU] via SUBCUTANEOUS

## 2014-05-16 MED ORDER — FUROSEMIDE 40 MG PO TABS
40.0000 mg | ORAL_TABLET | Freq: Two times a day (BID) | ORAL | Status: DC
  Administered 2014-05-16 – 2014-05-17 (×2): 40 mg via ORAL
  Filled 2014-05-16 (×4): qty 1

## 2014-05-16 MED ORDER — FENTANYL CITRATE 0.05 MG/ML IJ SOLN
INTRAMUSCULAR | Status: DC | PRN
  Administered 2014-05-16: 25 ug via INTRAVENOUS

## 2014-05-16 MED ORDER — INSULIN ASPART 100 UNIT/ML ~~LOC~~ SOLN
SUBCUTANEOUS | Status: DC | PRN
  Administered 2014-05-16: 10 [IU] via SUBCUTANEOUS

## 2014-05-16 MED ORDER — SODIUM CHLORIDE 0.9 % IV SOLN
INTRAVENOUS | Status: DC
  Administered 2014-05-16: 500 mL via INTRAVENOUS

## 2014-05-16 NOTE — Anesthesia Postprocedure Evaluation (Signed)
Anesthesia Post Note  Patient: [name]  Procedure(s) Performed: Procedure(s) (LRB): TRANSESOPHAGEAL ECHOCARDIOGRAM (TEE) (N/A) CARDIOVERSION (N/A)  Anesthesia type: General  Patient location: PACU  Post pain: Pain level controlled  Post assessment: Patient's Cardiovascular Status Stable  Last Vitals:  Filed Vitals:   05/16/14 1310  BP: 96/41  Pulse: 72  Temp: 36.4 C  Resp: 13    Post vital signs: Reviewed and stable  Level of consciousness: alert  Complications: No apparent anesthesia complications

## 2014-05-16 NOTE — Anesthesia Preprocedure Evaluation (Signed)
Anesthesia Evaluation  Patient identified by MRN, date of birth, ID band Patient awake    Reviewed: Allergy & Precautions, H&P , NPO status , Patient's Chart, lab work & pertinent test results, reviewed documented beta blocker date and time   Airway Mallampati: II TM Distance: >3 FB Neck ROM: full    Dental   Pulmonary shortness of breath, sleep apnea and Continuous Positive Airway Pressure Ventilation , resolved, COPDformer smoker,  breath sounds clear to auscultation        Cardiovascular hypertension, On Medications and On Home Beta Blockers + CAD, + Peripheral Vascular Disease and +CHF negative cardio ROS  + dysrhythmias Atrial Fibrillation Rhythm:regular     Neuro/Psych PSYCHIATRIC DISORDERS  Neuromuscular disease    GI/Hepatic Neg liver ROS, GERD-  Medicated and Controlled,  Endo/Other  diabetes, Insulin Dependent, Oral Hypoglycemic AgentsHypothyroidism   Renal/GU Renal InsufficiencyRenal disease  negative genitourinary   Musculoskeletal   Abdominal   Peds  Hematology  (+) anemia ,   Anesthesia Other Findings See surgeon's H&P   Reproductive/Obstetrics negative OB ROS                           Anesthesia Physical Anesthesia Plan  ASA: III  Anesthesia Plan: General   Post-op Pain Management:    Induction: Intravenous  Airway Management Planned: Mask  Additional Equipment:   Intra-op Plan:   Post-operative Plan:   Informed Consent: I have reviewed the patients History and Physical, chart, labs and discussed the procedure including the risks, benefits and alternatives for the proposed anesthesia with the patient or authorized representative who has indicated his/her understanding and acceptance.   Dental Advisory Given  Plan Discussed with: CRNA and Surgeon  Anesthesia Plan Comments:         Anesthesia Quick Evaluation

## 2014-05-16 NOTE — Progress Notes (Signed)
0700-1900 shift. Pt.is A/Ox4 and is ambulatory without assistance. He is on room air. TEE with cardioversion done today. Pt.tolerated advanced diet well. He had c/o lower back pain and prn pain medication was given. He had no signs of distress.

## 2014-05-16 NOTE — Progress Notes (Signed)
Patient evaluated for community based chronic disease management services with [place] [facility] as a benefit of patient's [employer]. Spoke with patient at bedside to explain [place] [facility] services.  Services accepted with written consent.  His wife Franko Hilliker is his authorized contact at [phone].  Patient expressed that the combined medication cost for him and his spouse is a burden at home.  Patient will receive a post discharge transition of care call and will be evaluated for monthly home visits for assessments and disease process education.  Left contact information and [facility] literature at bedside. Made Inpatient Case Manager aware that [place] [facility] following. Of note, [facility] Care Management services does not replace or interfere with any services that are arranged by inpatient case management or social work.  For additional questions or referrals please contact Corliss Blacker BSN RN [facility] Liaison at [phone].

## 2014-05-16 NOTE — Progress Notes (Signed)
ANTICOAGULATION CONSULT NOTE - Follow up [name] for Coumadin  Indication: DVT , afib  Allergies  Allergen Reactions  . Pioglitazone Swelling    Patient Measurements: Height: 5\' 11"  (180.3 cm) Weight: 310 lb (140.615 kg) (scale A) IBW/kg (Calculated) : 75.3 Heparin dosing weight: 108 kg   Vital Signs: Temp: 97.2 F (36.2 C) (06/09 0959) Temp src: Oral (06/09 0959) BP: 118/75 mmHg (06/09 0959) Pulse Rate: 70 (06/09 0959)  Labs:  Recent Labs  05/13/14 1338  05/13/14 1855 05/13/14 2250 05/14/14 0410 05/15/14 0130 05/15/14 1457 05/15/14 2300 05/16/14 0345  HGB 13.0  --   --   --   --  13.1  --   --  12.8*  HCT 40.0  --   --   --   --  39.8  --   --  39.6  PLT 274  --   --   --   --  267  --   --  276  LABPROT  --   < >  --   --  19.8* 21.3*  --   --  23.5*  INR  --   < >  --   --  1.74* 1.91*  --   --  2.17*  HEPARINUNFRC  --   --   --   --   --  0.12* 0.26* 0.30  --   CREATININE 1.47*  --   --   --  1.50* 1.54*  --   --  1.60*  TROPONINI  --   --  <0.30 <0.30 <0.30  --   --   --   --   < > = values in this interval not displayed.  Estimated Creatinine Clearance: 60.7 ml/min (by C-G formula based on Cr of 1.6).   Assessment:   41 YOM with DM and hx of DVT and afib on coumadin PTA, admitted 05/13/2014  for worsening SOB. Pharmacy currently managing coumadin and U-500 insulin.   Events 6/9 plan for TEE cardioversion today  PTA coumadin dose: 4mg  on MWF, 6mg  on STTS (last dose 6/5), dose changed to this regimen 6/3 for supertherapeutic INR  Coag: Afib/DVT INR at goal.  CBC stable, no bleeding reported.   Endo: T1DM, hypoglycemic event after NPO for procedure that was rescheduled for 6/9, U500 DCd and changed to Lantus/SSI understanding glucoses will drift up but while NPO pending procedure to avoid hypoglycemia, glucose trend up, note recommendations from DM coordinator from 6/8 to switch to Lantus  PTA U500. Per patient he takes insulin 3-4 times  daily with CBG checking. Usually he inject 300 units (0.6 ml) with major meals if his CBG is > 200, and 100 units (0.2 ml) with a snack. If his CBG is between 100 - 200, then he gives himself half of the dose. If CBG < 100, then no insulin. He is currently on heart healthy/carb modified diet. CBG 150 on ACHS cbg checks.   Insulin U500 3 times daily with meal and with CBG check,  if CBG > 200, give 300 units (0.6 ml);  if CBG 100 - 200, give 150 units (0.42ml);  if CBG < 100, do not give U500 - d/c sliding scale insulin  Cardiovascular: Diastolic HF, has slept in recliner for years. Inc LE edema PTA. HLD, CAD, hx sick sinus s/p pacer. Diuresed 3L yesterday, on Lasix 40 IV BID. Meds: ASA, atorva, metop 25 bid,   Neurology: depression Nephrology: Hx CKD (?baseline < 2). CrCl ~ 60  ml/min Hematology / Oncology: hx bladder/ureteral ca. CBC WNL PTA Medication Issues: resumed appropriately, only on lasix 20 mg outpatient  Best Practices: warfarin  Goal of Therapy:  INR 2-3 Monitor platelets by anticoagulation protocol: Yes   Plan:  - Daily PT/INR, CBC  -Warfarin 6 mg PO x 1 today, transition back to home dose  Thank you for allowing pharmacy to be a part of this patients care team.  [name] Pharm.D., BCPS, AQ-Cardiology Clinical Pharmacist 05/16/2014 10:29 AM Pager: [phone] Phone: [phone]

## 2014-05-16 NOTE — H&P (View-Only) (Signed)
Reason for Consult: atrial fibrillation   Requesting Physician: [place]  Cardiologist: Allred  HPI: This is a 73 y.o. male with a past medical history significant for obstructive sleep apnea, COPD, obesity, diabetes mellitus, chronic lower extremity edema and sinus node dysfunction status post implantation of a dual-chamber permanent pacemaker, with previous episodes of paroxysmal atrial fibrillation.  Roughly 3 days ago he had a sudden onset of fatigue and worsening dyspnea as well as rapid palpitations and worsening lower extremity edema. On arrival to the hospital he was found to have atrial fibrillation rapid ventricular response and interrogation of his pacemaker shows a three-day episode of atrial fibrillation, often with rapid ventricular rates. This has been going on without interruption since 4 PM on June 4. Unfortunately his anticoagulation level is subtherapeutic with an INR of 1.7. Just a week ago his INR was 3.7 and his warfarin dosage was adjusted downward. He feels better after treatment with diuretics and oxygen.  PMHx:  Past Medical History  Diagnosis Date  . Diabetes mellitus   . COPD (chronic obstructive pulmonary disease)     CPAP  . Renal insufficiency   . Hyperlipemia   . Other and unspecified coagulation defects   . Hypothyroidism   . Hyperkalemia   . 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 Sick sinus syndrome     s/p PPM by JA  . Pancreatitis   . Obesity   . Constipation   . OSA on CPAP     using CPAP although it is hard for him to tolerate  . SOB (shortness of breath)   . CAD (coronary artery disease)     not much  . Anginal pain     at times  . Hypertension     occasional  . Pacemaker   . Depression     recently  . Anxiety   . H/O Legionnaire's disease 2003  . History of blood clots     R groin  . Peripheral neuropathy   . Osteoarthritis     fingers  . GERD (gastroesophageal reflux disease)   . Anemia     hx of  .  Pneumonia 2003  . Cancer 2014    bladder cancer AND RIGHT URETERAL CANCER  . CHF (congestive heart failure)    Past Surgical History  Procedure Laterality Date  . Nasal septum surgery  1967  . Pacemaker placement  06/2010    [facility] [employer] RF DR, Model [id number] ( Serial number [id number])  . Thromboembolectomy and four compartment fasciotomy  2009    GROIN AREA  . Cardiac catheterization    . Cataract extraction Right   . Cholecystectomy  2012  . Insert / replace / remove pacemaker  2011  . Transurethral resection of bladder tumor with gyrus (turbt-gyrus) N/A 10/26/2013    Procedure: TRANSURETHRAL RESECTION OF BLADDER TUMOR WITH GYRUS (TURBT-GYRUS);  Surgeon: Alexis Frock, MD;  Location: [facility];  Service: Urology;  Laterality: N/A;  . Cystoscopy w/ ureteral stent placement Right 10/26/2013    Procedure: CYSTOSCOPY WITH RETROGRADE PYELOGRAM/URETERAL STENT PLACEMENT;  Surgeon: Alexis Frock, MD;  Location: [facility];  Service: Urology;  Laterality: Right;  . Embolectomy Left 11/02/2013    Procedure: LEFT FEMORAL EMBOLECTOMY, LEFT FEMORAL ARTERY ENDARTERECTOMY WITH DACRON PATCH ANGIOPLASTY.;  Surgeon: Mal Misty, MD;  Location: [place];  Service: Vascular;  Laterality: Left;  . Transurethral resection of bladder tumor with gyrus (turbt-gyrus) N/A  01/11/2014    Procedure: TRANSURETHRAL RESECTION OF BLADDER TUMOR WITH GYRUS (TURBT-GYRUS);  Surgeon: Alexis Frock, MD;  Location: [facility];  Service: Urology;  Laterality: N/A;  . Cystoscopy with ureteroscopy and stent placement Right 01/11/2014    Procedure: CYSTOSCOPY WITH URETEROSCOPY ,RIGHT RETROGRADE AND STENT CHANGE AND LASER OF URETERAL TUMOR;  Surgeon: Alexis Frock, MD;  Location: [facility];  Service: Urology;  Laterality: Right;  . Holmium laser application Right 05/09/2632    Procedure: HOLMIUM LASER APPLICATION;  Surgeon: Alexis Frock, MD;  Location: [facility];  Service: Urology;  Laterality: Right;    FAMHx: Family History  Problem  Relation Age of Onset  . Liver cancer Mother     deceased age 26  . Cancer Mother     liver cancer  . Colon cancer Neg Hx   . Heart attack Father     SOCHx:  reports that he quit smoking about 7 years ago. His smoking use included Cigarettes. He has a 100 pack-year smoking history. He has never used smokeless tobacco. He reports that he does not drink alcohol or use illicit drugs.  ALLERGIES: Allergies  Allergen Reactions  . Pioglitazone Swelling    ROS:  The patient specifically denies any chest pain at rest or with exertion, dyspnea at rest, orthopnea, paroxysmal nocturnal dyspnea, syncope, palpitations, focal neurological deficits, intermittent claudication, lower extremity edema, unexplained weight gain, cough, hemoptysis or wheezing.  The patient also denies abdominal pain, nausea, vomiting, dysphagia, diarrhea, constipation, polyuria, polydipsia, dysuria, hematuria, frequency, urgency, abnormal bleeding or bruising, fever, chills, unexpected weight changes, mood swings, change in skin or hair texture, change in voice quality, auditory or visual problems, allergic reactions or rashes, new musculoskeletal complaints other than usual "aches and pains".    HOME MEDICATIONS: Prescriptions prior to admission  Medication Sig Dispense Refill  . acetaminophen (TYLENOL) 500 MG tablet Take 500 mg by mouth 2 (two) times daily as needed for moderate pain (pain).       Marland Kitchen albuterol (PROAIR HFA) 108 (90 BASE) MCG/ACT inhaler Inhale 2 puffs into the lungs every 6 (six) hours as needed for wheezing or shortness of breath.      . Ascorbic Acid (VITAMIN C) 500 MG tablet Take 500 mg by mouth daily.       Marland Kitchen aspirin 325 MG tablet Take 325 mg by mouth daily.       . brimonidine (ALPHAGAN P) 0.1 % SOLN Place 1 drop into the left eye 3 (three) times daily.       . calcium carbonate (TUMS - DOSED IN MG ELEMENTAL CALCIUM) 500 MG chewable tablet Chew 2 tablets by mouth 2 (two) times daily as needed for  indigestion or heartburn.      . cholecalciferol (VITAMIN D) 1000 UNITS tablet Take 1,000 Units by mouth daily.      Marland Kitchen dextromethorphan (DELSYM) 30 MG/5ML liquid Take 90 mg by mouth 2 (two) times daily as needed for cough.      . dorzolamide-timolol (COSOPT) 22.3-6.8 MG/ML ophthalmic solution Place 1 drop into both eyes 2 (two) times daily.       . fludrocortisone (FLORINEF) 0.1 MG tablet Take 0.1 mg by mouth once a week. On Mondays      . fluticasone (FLONASE) 50 MCG/ACT nasal spray Place 2 sprays into both nostrils daily as needed for allergies or rhinitis.      . furosemide (LASIX) 40 MG tablet Take 20 mg by mouth daily.       Marland Kitchen guaifenesin (HUMIBID  E) 400 MG TABS tablet Take 400 mg by mouth 2 (two) times daily as needed (congestion).       . insulin regular human CONCENTRATED (HUMULIN R) 500 UNIT/ML SOLN injection Inject 100-300 Units into the skin 4 (four) times daily -  with meals and at bedtime. Inject into the skin 3 times daily (blood sugar > 200 = 300 units (drawn up as 60 units), blood sugar 100 - 200 = 150 units (drawn up as 30 units), blood sugar <100 = no insulin with meals and 100 units (drawn up as 20 units) with bedtime snack .      Marland Kitchen Lactobacillus (ACIDOPHILUS) CAPS capsule Take 1 capsule by mouth daily.      Marland Kitchen latanoprost (XALATAN) 0.005 % ophthalmic solution Place 1 drop into both eyes at bedtime.      Marland Kitchen levothyroxine (SYNTHROID, LEVOTHROID) 50 MCG tablet Take 50 mcg by mouth daily before breakfast.      . Multiple Vitamin (MULTIVITAMIN WITH MINERALS) TABS tablet Take 1 tablet by mouth daily.      . Omega-3 Fatty Acids (FISH OIL) 1000 MG CAPS Take 1,000 mg by mouth daily.      Marland Kitchen omeprazole (PRILOSEC) 20 MG capsule Take 20 mg by mouth daily.      . rosuvastatin (CRESTOR) 40 MG tablet Take 0.5 tablets (20 mg total) by mouth every evening.  30 tablet  2  . traMADol (ULTRAM) 50 MG tablet Take 100 mg by mouth 2 (two) times daily as needed (pain).      Marland Kitchen warfarin (COUMADIN) 4 MG tablet  Take 4-6 mg by mouth at bedtime. Take 1 tablet (4 mg) on Monday, Wednesday and Friday, take 1 1/2 tablet (6 mg) on Sunday, Tuesday, Thursday, Saturday        HOSPITAL MEDICATIONS: I have reviewed the patient's current medications. Prior to Admission:  Prescriptions prior to admission  Medication Sig Dispense Refill  . acetaminophen (TYLENOL) 500 MG tablet Take 500 mg by mouth 2 (two) times daily as needed for moderate pain (pain).       Marland Kitchen albuterol (PROAIR HFA) 108 (90 BASE) MCG/ACT inhaler Inhale 2 puffs into the lungs every 6 (six) hours as needed for wheezing or shortness of breath.      . Ascorbic Acid (VITAMIN C) 500 MG tablet Take 500 mg by mouth daily.       Marland Kitchen aspirin 325 MG tablet Take 325 mg by mouth daily.       . brimonidine (ALPHAGAN P) 0.1 % SOLN Place 1 drop into the left eye 3 (three) times daily.       . calcium carbonate (TUMS - DOSED IN MG ELEMENTAL CALCIUM) 500 MG chewable tablet Chew 2 tablets by mouth 2 (two) times daily as needed for indigestion or heartburn.      . cholecalciferol (VITAMIN D) 1000 UNITS tablet Take 1,000 Units by mouth daily.      Marland Kitchen dextromethorphan (DELSYM) 30 MG/5ML liquid Take 90 mg by mouth 2 (two) times daily as needed for cough.      . dorzolamide-timolol (COSOPT) 22.3-6.8 MG/ML ophthalmic solution Place 1 drop into both eyes 2 (two) times daily.       . fludrocortisone (FLORINEF) 0.1 MG tablet Take 0.1 mg by mouth once a week. On Mondays      . fluticasone (FLONASE) 50 MCG/ACT nasal spray Place 2 sprays into both nostrils daily as needed for allergies or rhinitis.      . furosemide (LASIX) 40 MG  tablet Take 20 mg by mouth daily.       Marland Kitchen guaifenesin (HUMIBID E) 400 MG TABS tablet Take 400 mg by mouth 2 (two) times daily as needed (congestion).       . insulin regular human CONCENTRATED (HUMULIN R) 500 UNIT/ML SOLN injection Inject 100-300 Units into the skin 4 (four) times daily -  with meals and at bedtime. Inject into the skin 3 times daily (blood  sugar > 200 = 300 units (drawn up as 60 units), blood sugar 100 - 200 = 150 units (drawn up as 30 units), blood sugar <100 = no insulin with meals and 100 units (drawn up as 20 units) with bedtime snack .      Marland Kitchen Lactobacillus (ACIDOPHILUS) CAPS capsule Take 1 capsule by mouth daily.      Marland Kitchen latanoprost (XALATAN) 0.005 % ophthalmic solution Place 1 drop into both eyes at bedtime.      Marland Kitchen levothyroxine (SYNTHROID, LEVOTHROID) 50 MCG tablet Take 50 mcg by mouth daily before breakfast.      . Multiple Vitamin (MULTIVITAMIN WITH MINERALS) TABS tablet Take 1 tablet by mouth daily.      . Omega-3 Fatty Acids (FISH OIL) 1000 MG CAPS Take 1,000 mg by mouth daily.      Marland Kitchen omeprazole (PRILOSEC) 20 MG capsule Take 20 mg by mouth daily.      . rosuvastatin (CRESTOR) 40 MG tablet Take 0.5 tablets (20 mg total) by mouth every evening.  30 tablet  2  . traMADol (ULTRAM) 50 MG tablet Take 100 mg by mouth 2 (two) times daily as needed (pain).      Marland Kitchen warfarin (COUMADIN) 4 MG tablet Take 4-6 mg by mouth at bedtime. Take 1 tablet (4 mg) on Monday, Wednesday and Friday, take 1 1/2 tablet (6 mg) on Sunday, Tuesday, Thursday, Saturday       Scheduled: . acidophilus  1 capsule Oral Daily  . aspirin  325 mg Oral Daily  . atorvastatin  40 mg Oral q1800  . brimonidine  1 drop Left Eye TID  . Chlorhexidine Gluconate Cloth  6 each Topical Q0600  . cholecalciferol  1,000 Units Oral Daily  . dorzolamide-timolol  1 drop Both Eyes BID  . [START ON 05/15/2014] fludrocortisone  0.1 mg Oral Weekly  . furosemide  40 mg Intravenous BID  . insulin aspart  0-5 Units Subcutaneous QHS  . insulin regular human CONCENTRATED  0-300 Units Subcutaneous TID WC  . insulin regular human CONCENTRATED  150 Units Subcutaneous Once  . latanoprost  1 drop Both Eyes QHS  . levothyroxine  50 mcg Oral QAC breakfast  . multivitamin with minerals  1 tablet Oral Daily  . mupirocin ointment  1 application Nasal BID  . pantoprazole  40 mg Oral Q breakfast    . sodium chloride  3 mL Intravenous Q12H  . vitamin C  500 mg Oral Daily  . warfarin  7.5 mg Oral ONCE-1800  . Warfarin - Pharmacist Dosing Inpatient   Does not apply q1800    VITALS: Blood pressure 128/74, pulse 70, temperature 97.9 F (36.6 C), temperature source Oral, resp. rate 20, height 5\' 11"  (1.803 m), weight 315 lb 1.6 oz (142.928 kg), SpO2 99.00%.  PHYSICAL EXAM:  General: Alert, oriented x3, no distress, morbid obesity Head: no evidence of trauma, PERRL, EOMI, no exophtalmos or lid lag, no myxedema, no xanthelasma; normal ears, nose and oropharynx Neck: Unable to see the jugular venous pulsations and no hepatojugular reflux; brisk  carotid pulses without delay and no carotid bruits Chest: clear to auscultation, no signs of consolidation by percussion or palpation, normal fremitus, symmetrical and full respiratory excursions Cardiovascular: Unable to identify the apical impulse, regular rhythm, normal first heart sound and widely split second heart sound, no rubs or gallops, no murmur Abdomen: no tenderness or distention, no masses by palpation, no abnormal pulsatility or arterial bruits, normal bowel sounds, no hepatosplenomegaly Extremities: no clubbing, cyanosis;  2+ symmetrical ankle edema; 2+ radial, ulnar and brachial pulses bilaterally; 2+ right femoral, posterior tibial and dorsalis pedis pulses; 2+ left femoral, posterior tibial and dorsalis pedis pulses; no subclavian or femoral bruits Neurological: grossly nonfocal   LABS  CBC  Recent Labs  05/13/14 1338  WBC 11.5*  HGB 13.0  HCT 40.0  MCV 93.0  PLT 355   Basic Metabolic Panel  Recent Labs  05/13/14 1338 05/14/14 0410  NA 137 140  K 4.5 4.1  CL 99 100  CO2 21 25  GLUCOSE 332* 175*  BUN 28* 32*  CREATININE 1.47* 1.50*  CALCIUM 9.1 9.1   Liver Function Tests No results found for this basename: AST, ALT, ALKPHOS, BILITOT, PROT, ALBUMIN,  in the last 72 hours No results found for this basename:  LIPASE, AMYLASE,  in the last 72 hours Cardiac Enzymes  Recent Labs  05/13/14 1855 05/13/14 2250 05/14/14 0410  TROPONINI <0.30 <0.30 <0.30   IMAGING: Dg Chest 2 View  05/13/2014   CLINICAL DATA:  Shortness of Breath  EXAM: CHEST  2 VIEW  COMPARISON:  January 03, 2014  FINDINGS: There is underlying emphysematous change. There is no edema or consolidation. The heart size is normal. Pulmonary vascularity reflects underlying emphysematous change in is stable. Pacemaker leads are attached to the right atrium and right ventricle. No adenopathy. There is degenerative change in the thoracic spine.  IMPRESSION: Underlying emphysematous change.  No edema or consolidation.   Electronically Signed   By: Lowella Grip M.D.   On: 05/13/2014 15:07    ECG: Atrial fibrillation, right bundle branch block (old), intermittent ventricular pacing  TELEMETRY:  atrial fibrillation, most with controlled ventricular rate, sometimes ventricular rates over 120 beats per minute  Echo November 2014 shows normal left ventricle size with moderate LVH and ejection fraction of 55-60%, mild mitral dilatation, mild aortic valve stenosis. No comment is made regarding diastolic function, but the recorded the Doppler velocity suggested abnormal filling, consistent with moderate diastolic dysfunction  Pacemaker interrogation ([employer] DR RF) performed by me shows normal device function. Automatic mode switch began on June 4 at 4:20 PM and has continued unabated since then. A few other brief episodes of mode switch represent paroxysmal atrial tachycardia, some are simply competitive sensor driven pacing.  IMPRESSION: 1. Symptomatic atrial fibrillation, complicated by acute on chronic diastolic heart failure 2. Morbid obesity 3. Obstructive sleep apnea 4. Diabetes mellitus insulin requiring  RECOMMENDATION: 1. He will benefit from synchronized cardioversion but this cannot be safely performed without first ensuring  that he does not have a left atrial thrombus with transesophageal echocardiography. We'll try to schedule both those procedures tomorrow. The risks and benefits of those procedures and moderate sedation were discussed in detail with the patient and his wife. He wishes to proceed as soon as possible. 2. He is at relatively high risk of arrhythmia recurrence and will probably benefit from antiarrhythmic therapy, at least in the short-term. These medications cannot be started until we are sure that he does not have a  left atrial thrombus. 3. Start intravenous heparin until INR greater than 2.0 4. We'll add beta blockers as control medications until the cardioversion  Time Spent Directly with Patient: 60 minutes  Sanda Klein, MD, [facility] [phone] office [phone] pager   05/14/2014, 4:51 PM

## 2014-05-16 NOTE — Progress Notes (Signed)
Inpatient Diabetes Program Recommendations  AACE/ADA: New Consensus Statement on Inpatient Glycemic Control (2013)  Target Ranges:  Prepandial:   less than 140 mg/dL      Peak postprandial:   less than 180 mg/dL (1-2 hours)      Critically ill patients:  140 - 180 mg/dL  Results for MICAL, KICKLIGHTER (MRN [id number]) as of 05/16/2014 11:37  Ref. Range 05/16/2014 05:53  Glucose-Capillary Latest Range: 70-99 mg/dL 382 (H)   Recommend increasing Lantus and correction scale to Resistant. Thank you  Raoul Pitch BSN, RN,CDE Inpatient Diabetes Coordinator [phone] (team pager)

## 2014-05-16 NOTE — Progress Notes (Signed)
ANTICOAGULATION CONSULT NOTE - Follow Up Consult  Pharmacy Consult for Heparin Indication: Afib /DVT  Allergies  Allergen Reactions  . Pioglitazone Swelling    Patient Measurements: Height: 5\' 11"  (180.3 cm) Weight: 314 lb (142.429 kg) (scale a) IBW/kg (Calculated) : 75.3 Heparin Dosing Weight: 108 kg  Vital Signs: Temp: 98.4 F (36.9 C) (06/08 2031) Temp src: Oral (06/08 2031) BP: 132/77 mmHg (06/08 2152) Pulse Rate: 80 (06/09 0005)  Labs:  Recent Labs  05/13/14 1338 05/13/14 1618 05/13/14 1855 05/13/14 2250 05/14/14 0410 05/15/14 0130 05/15/14 1457 05/15/14 2300  HGB 13.0  --   --   --   --  13.1  --   --   HCT 40.0  --   --   --   --  39.8  --   --   PLT 274  --   --   --   --  267  --   --   LABPROT  --  19.9*  --   --  19.8* 21.3*  --   --   INR  --  1.75*  --   --  1.74* 1.91*  --   --   HEPARINUNFRC  --   --   --   --   --  0.12* 0.26* 0.30  CREATININE 1.47*  --   --   --  1.50* 1.54*  --   --   TROPONINI  --   --  <0.30 <0.30 <0.30  --   --   --     Estimated Creatinine Clearance: 63.5 ml/min (by C-G formula based on Cr of 1.54).   Assessment: 89 YOM with DM and hx of DVT and afib on coumadin PTA, admitted 05/13/2014 for worsening SOB. Pharmacy currently managing heparin, coumadin and U-500 insulin. Heparin bridge till INR is therapeutic.   Heparin is at goal 0.30 IU/ml with no bleeding noted. On 2100 units/hr   Goal of Therapy:  Heparin level 0.3-0.7 units/ml Monitor platelets by anticoagulation protocol: Yes   Plan:  Continue IV heparin @ 2100 units/hr Recheck heparin level with daily labs.   Curlene Dolphin 05/16/2014,1:12 AM

## 2014-05-16 NOTE — Plan of Care (Signed)
Problem: Phase I Progression Outcomes Goal: EF % per last Echo/documented,Core Reminder form on chart Outcome: Completed/Met Date Met:  05/16/14 Ef= 60%-65%  Done June 8,2015     

## 2014-05-16 NOTE — Interval H&P Note (Signed)
History and Physical Interval Note:  05/16/2014 7:27 AM  [name]  has presented today for surgery, with the diagnosis of A FIB   The various methods of treatment have been discussed with the patient and family. After consideration of risks, benefits and other options for treatment, the patient has consented to  Procedure(s): TRANSESOPHAGEAL ECHOCARDIOGRAM (TEE) (N/A) CARDIOVERSION (N/A) as a surgical intervention .  The patient's history has been reviewed, patient examined, no change in status, stable for surgery.  I have reviewed the patient's chart and labs.  Questions were answered to the patient's satisfaction.     Josue Hector

## 2014-05-16 NOTE — Progress Notes (Signed)
*  PRELIMINARY RESULTS* Echocardiogram Echocardiogram Transesophageal has been performed.  Elvia Collum 05/16/2014, 1:18 PM

## 2014-05-16 NOTE — Transfer of Care (Signed)
Immediate Anesthesia Transfer of Care Note  Patient: [name]  Procedure(s) Performed: Procedure(s): TRANSESOPHAGEAL ECHOCARDIOGRAM (TEE) (N/A) CARDIOVERSION (N/A)  Patient Location: PACU  Anesthesia Type:MAC  Level of Consciousness: awake and alert   Airway & Oxygen Therapy: Patient Spontanous Breathing and Patient connected to nasal cannula oxygen  Post-op Assessment: Report given to PACU RN and Post -op Vital signs reviewed and stable  Post vital signs: Reviewed and stable  Complications: No apparent anesthesia complications

## 2014-05-16 NOTE — CV Procedure (Signed)
TEE/DCC:  Propofol anesthesia  Severe LVH EF 40-45% Mild MR Biatrial enlargement Normal RV No LAA thrombus Pacing wires in RA/RV  DCC:  X 1 with 200J  Converted to NSR  Has poor AV conduction with wenkebach at normal PR intervals.  [employer] rep Will discuss with Dr Rayann Heman optimal DDDR pacing settings  INR Rx at time of [facility]

## 2014-05-16 NOTE — Progress Notes (Signed)
SUBJECTIVE: The patient states his shortness of breath is improved, but not at baseline.  No chest pain.  He did not sleep well last night.  For TEE/DCCV today.   INR 2.17 today  CURRENT MEDICATIONS: . acidophilus  1 capsule Oral Daily  . aspirin  325 mg Oral Daily  . atorvastatin  40 mg Oral q1800  . brimonidine  1 drop Left Eye TID  . Chlorhexidine Gluconate Cloth  6 each Topical Q0600  . cholecalciferol  1,000 Units Oral Daily  . dorzolamide-timolol  1 drop Both Eyes BID  . fludrocortisone  0.1 mg Oral Weekly  . furosemide  40 mg Intravenous BID  . insulin aspart  0-15 Units Subcutaneous TID WC  . insulin aspart  10 Units Subcutaneous TID WC  . insulin glargine  30 Units Subcutaneous BID  . latanoprost  1 drop Both Eyes QHS  . levothyroxine  50 mcg Oral QAC breakfast  . metoprolol tartrate  25 mg Oral BID  . multivitamin with minerals  1 tablet Oral Daily  . mupirocin ointment  1 application Nasal BID  . pantoprazole  40 mg Oral Q breakfast  . sodium chloride  3 mL Intravenous Q12H  . vitamin C  500 mg Oral Daily  . Warfarin - Pharmacist Dosing Inpatient   Does not apply q1800   . sodium chloride    . heparin 2,100 Units/hr (05/16/14 0004)    OBJECTIVE: Physical Exam: Filed Vitals:   05/15/14 1722 05/15/14 2031 05/15/14 2152 05/16/14 0005  BP: 149/81 99/58 132/77   Pulse: 71 74 72 80  Temp: 97.3 F (36.3 C) 98.4 F (36.9 C)    TempSrc: Oral Oral    Resp: 18 18  18   Height:      Weight:      SpO2: 98% 100%  100%    Intake/Output Summary (Last 24 hours) at 05/16/14 8099 Last data filed at 05/16/14 0014  Gross per 24 hour  Intake 1715.61 ml  Output   2450 ml  Net -734.39 ml    Telemetry reveals atrial fibrillation, occasional ventricular pacing, ventricular rates 60-140's  GEN- The patient is overweight and chronically ill appearing, alert and oriented x 3 today.   Head- normocephalic, atraumatic Eyes-  Sclera clear, conjunctiva pink Ears- hearing  intact Oropharynx- clear Neck- supple,   Lungs- Clear to ausculation bilaterally, normal work of breathing Heart- irregular rate and rhythm  GI- soft, NT, ND, + BS Extremities- no clubbing, cyanosis, + dependant edema with venous stasis changes Skin- no rash or lesion Psych- euthymic mood, full affect Neuro- strength and sensation are intact  LABS: Basic Metabolic Panel:  Recent Labs  05/15/14 0130 05/16/14 0345  NA 139 134*  K 3.7 4.3  CL 98 93*  CO2 24 23  GLUCOSE 96 364*  BUN 38* 40*  CREATININE 1.54* 1.60*  CALCIUM 9.5 9.0   CBC:  Recent Labs  05/15/14 0130 05/16/14 0345  WBC 11.4* 11.2*  HGB 13.1 12.8*  HCT 39.8 39.6  MCV 91.7 92.1  PLT 267 276   Cardiac Enzymes:  Recent Labs  05/13/14 1855 05/13/14 2250 05/14/14 0410  TROPONINI <0.30 <0.30 <0.30   Hemoglobin A1C:  Recent Labs  05/15/14 1113  HGBA1C 7.3*    RADIOLOGY: Dg Chest 2 View 05/13/2014   CLINICAL DATA:  Shortness of Breath  EXAM: CHEST  2 VIEW  COMPARISON:  January 03, 2014  FINDINGS: There is underlying emphysematous change. There is no edema or consolidation. The  heart size is normal. Pulmonary vascularity reflects underlying emphysematous change in is stable. Pacemaker leads are attached to the right atrium and right ventricle. No adenopathy. There is degenerative change in the thoracic spine.  IMPRESSION: Underlying emphysematous change.  No edema or consolidation.   Electronically Signed   By: Lowella Grip M.D.   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 Persistent atrial fibrillation and atypical atrial flutter As this is his first recent episode, will proceed with TEE guided  cardioversion without initiation of AAD therapy. Continue coumadin (He declines noval anticoagulants) Awaiting case management to look into costs of NOAC for this patient. Merlin transmitter ordered for home for AF alerts.  Pt will be educated on use today.  STJ to send cell transmitter to patient. This will help [place] monitor at home for recurrent afib. Stop heparin drip  2. Sick sinus syndrome Encouraged merlin compliance as above  3. OSA Compliance with CPAP is necessary to maintain sinus long term  4. Obesity  Weight reduction encouraged  5. Acute diastolic dysfunction Agree with switching to PO lasix Hopefully he will  Improve with sinus rhythm He has venous insufficiency which complicates volume status evaluation.  He may benefit from vascular referral/ venous ablation consideration as an outpatient  6. CRI Stable No change required today  Ok to discharge to home after cardioversioin later today from my standpoint Follow-up with me in 6 weeks

## 2014-05-16 NOTE — Progress Notes (Signed)
Pt. Placed himself on CPAP earlier in the night. RT was called back to pt.'s room to adjust CPAP settings for pt. Comfort. CPAP was adjusted to auto titrate (Min: 8, Max: 20) Pt. Has a FFM (what he wears at home) & is tolerating CPAP without any complications.

## 2014-05-16 NOTE — Progress Notes (Signed)
[facility] PROGRESS NOTE  Filed Weights   05/14/14 0515 05/15/14 0549 05/16/14 2671  Weight: 142.928 kg (315 lb 1.6 oz) 142.429 kg (314 lb) 140.615 kg (310 lb)        Intake/Output Summary (Last 24 hours) at 05/16/14 0956 Last data filed at 05/16/14 2458  Gross per 24 hour  Intake 1305.61 ml  Output   2650 ml  Net -1344.39 ml     Assessment/Plan: 1-Shortness of breath due to  Acute on chronic Diastolic CHF:  -patient breathing better at rest; but still with findings of fluid overload on exam -patient also with symptomatic uncontrolled a. Fib  -will change lasix to PO, continue low sodium, strict I's and O's and daily weight -cardiology on board and plan is for cardioversion and TEE on 6/9  2-CKD (chronic kidney disease): stage 3 at baseline -stable -will follow trend with diuresis -switch lasix to PO  3-Depressive disorder, not elsewhere classified: stable. Not on any meds at home. -recommends follow up with PCP and psych evaluation if needed at discharge  4-Type I (juvenile type) diabetes mellitus with renal manifestations, uncontrolled: due to episodes of hypoglycemia and need for NPO status for procedures in am. Will continue lantus and SSI while inpatient for now. Resume concentrated insulin at discharge.  5-OBSTRUCTIVE SLEEP APNEA: continue CPAP QHS  6-OBESITY-MORBID (>100'): discussion about low calorie diet sustained with patient  7-hypothyroidism: continue synthroid  8-GERD: continue PPI  9-tongue bleeding: bleeding stop.    Code Status: Full Family Communication: wife at bedside  Disposition Plan: home when medically stable   Consultants:  Cardiology   Procedures: 2-D ECHO: 05/15/14 - Left ventricle: The cavity size was normal. Wall thickness was increased in a pattern of mild LVH. Systolic function was normal. The estimated ejection fraction was in the range of 60% to 65%. Indeterminant diastolic function (atrial fibrillation).  Although no diagnostic regional wall motion abnormality was identified, this possibility cannot be completely excluded on the basis of this study. - Aortic valve: Trileaflet; moderately calcified leaflets. There was mild stenosis. Mean gradient (S): 11 mm Hg. Peak gradient (S): 18 mm Hg. Valve area (VTI): 1.63 cm^2. - Aorta: Borderline dilated aortic root. Aortic root dimension: 38 mm (ED). - Mitral valve: Moderately calcified annulus. Mildly calcified leaflets . - Left atrium: The atrium was mildly dilated. - Right ventricle: The cavity size was normal. Pacer wire or catheter noted in right ventricle. Systolic function was normal. - Pulmonary arteries: No complete TR doppler jet so unable to estimate PA systolic pressure. - Systemic veins: IVC measured 1.9 cm with < 50% respirophasic variation, suggesting RA pressure 8 mmHg.  Antibiotics:  None   HPI/Subjective: Patient reports breathing is good. No further bleeding from his tongue. Patient still with heart racing sensation with minimal exertion  Objective: Filed Vitals:   05/15/14 2031 05/15/14 2152 05/16/14 0005 05/16/14 0628  BP: 99/58 132/77  103/80  Pulse: 74 72 80 91  Temp: 98.4 F (36.9 C)   98.1 F (36.7 C)  TempSrc: Oral   Oral  Resp: 18  18 18   Height:      Weight:    140.615 kg (310 lb)  SpO2: 100%  100% 99%     Exam:  General: Alert, awake, oriented x3, reports heart racing sensation continues with minimal activity HEENT: No bruits, no goiter. Mild JVD Heart: irregular, no rubs or gallops Lungs: no frank crackles appreciated, no wheezing Abdomen: Soft, nontender, nondistended, positive bowel sounds.  Neuro: Grossly intact,  nonfocal.    Data Reviewed: Basic Metabolic Panel:  Recent Labs Lab 05/13/14 1338 05/14/14 0410 05/15/14 0130 05/16/14 0345  NA 137 140 139 134*  K 4.5 4.1 3.7 4.3  CL 99 100 98 93*  CO2 21 25 24 23   GLUCOSE 332* 175* 96 364*  BUN 28* 32* 38* 40*  CREATININE 1.47*  1.50* 1.54* 1.60*  CALCIUM 9.1 9.1 9.5 9.0   CBC:  Recent Labs Lab 05/13/14 1338 05/15/14 0130 05/16/14 0345  WBC 11.5* 11.4* 11.2*  HGB 13.0 13.1 12.8*  HCT 40.0 39.8 39.6  MCV 93.0 91.7 92.1  PLT 274 267 276   Cardiac Enzymes:  Recent Labs Lab 05/13/14 1855 05/13/14 2250 05/14/14 0410  TROPONINI <0.30 <0.30 <0.30   BNP (last 3 results)  Recent Labs  10/29/13 0447 11/09/13 1359 05/13/14 1338  PROBNP 1362.0* 64.0 2142.0*   CBG:  Recent Labs Lab 05/15/14 0705 05/15/14 1103 05/15/14 1619 05/15/14 2026 05/16/14 0553  GLUCAP 108* 240* 233* 314* 382*    Recent Results (from the past 240 hour(s))  MRSA PCR SCREENING     Status: Abnormal   Collection Time    05/13/14  7:02 PM      Result Value Ref Range Status   MRSA by PCR POSITIVE (*) NEGATIVE Final   Comment:            The GeneXpert MRSA Assay (FDA     approved for NASAL specimens     only), is one component of a     comprehensive MRSA colonization     surveillance program. It is not     intended to diagnose MRSA     infection nor to guide or     monitor treatment for     MRSA infections.     RESULT CALLED TO, READ BACK BY AND VERIFIED WITH:     FUTRELL,M RN 05/13/14 2044 WOOTEN,K     Studies: No results found.  Scheduled Meds: . acidophilus  1 capsule Oral Daily  . aspirin  325 mg Oral Daily  . atorvastatin  40 mg Oral q1800  . brimonidine  1 drop Left Eye TID  . Chlorhexidine Gluconate Cloth  6 each Topical Q0600  . cholecalciferol  1,000 Units Oral Daily  . dorzolamide-timolol  1 drop Both Eyes BID  . fludrocortisone  0.1 mg Oral Weekly  . furosemide  40 mg Intravenous BID  . insulin aspart  0-15 Units Subcutaneous TID WC  . insulin aspart  10 Units Subcutaneous TID WC  . insulin glargine  30 Units Subcutaneous BID  . latanoprost  1 drop Both Eyes QHS  . levothyroxine  50 mcg Oral QAC breakfast  . metoprolol tartrate  25 mg Oral BID  . multivitamin with minerals  1 tablet Oral Daily   . mupirocin ointment  1 application Nasal BID  . pantoprazole  40 mg Oral Q breakfast  . sodium chloride  3 mL Intravenous Q12H  . vitamin C  500 mg Oral Daily  . Warfarin - Pharmacist Dosing Inpatient   Does not apply q1800   Continuous Infusions: . sodium chloride      Time >30 minutes  [facility] Pager [phone]. If 8PM-8AM, please contact night-coverage at [email or web address], password [facility] 05/16/2014, 9:56 AM  LOS: 3 days     **Disclaimer: This note may have been dictated with voice recognition software. Similar sounding words can inadvertently be transcribed and this note may contain transcription errors which may not  have been corrected upon publication of note.**

## 2014-05-16 NOTE — Progress Notes (Signed)
Patient has refused the CPAP machine for tonight. Patient says he tried to use the machine last night and just cant get comfortable with the mask. Patient is currently comfortable, in no apparent distress. Patient is aware to call if he does change his mind about the CPAP. RT will continue to assist as needed.

## 2014-05-17 ENCOUNTER — Encounter (HOSPITAL_COMMUNITY): Payer: Self-pay | Admitting: Cardiovascular Disease

## 2014-05-17 DIAGNOSIS — R1013 Epigastric pain: Secondary | ICD-10-CM

## 2014-05-17 DIAGNOSIS — K219 Gastro-esophageal reflux disease without esophagitis: Secondary | ICD-10-CM

## 2014-05-17 DIAGNOSIS — I5033 Acute on chronic diastolic (congestive) heart failure: Secondary | ICD-10-CM | POA: Diagnosis not present

## 2014-05-17 DIAGNOSIS — N189 Chronic kidney disease, unspecified: Secondary | ICD-10-CM | POA: Diagnosis not present

## 2014-05-17 DIAGNOSIS — I4891 Unspecified atrial fibrillation: Secondary | ICD-10-CM | POA: Diagnosis not present

## 2014-05-17 DIAGNOSIS — R0602 Shortness of breath: Secondary | ICD-10-CM | POA: Diagnosis not present

## 2014-05-17 LAB — BASIC METABOLIC PANEL
BUN: 46 mg/dL — ABNORMAL HIGH (ref 6–23)
CALCIUM: 9.4 mg/dL (ref 8.4–10.5)
CO2: 22 mEq/L (ref 19–32)
CREATININE: 1.7 mg/dL — AB (ref 0.50–1.35)
Chloride: 93 mEq/L — ABNORMAL LOW (ref 96–112)
GFR, EST AFRICAN AMERICAN: 45 mL/min — AB (ref >90)
GFR, EST NON AFRICAN AMERICAN: 39 mL/min — AB (ref >90)
Glucose, Bld: 423 mg/dL — ABNORMAL HIGH (ref 70–99)
Potassium: 4.8 mEq/L (ref 3.7–5.3)
SODIUM: 135 meq/L — AB (ref 137–147)

## 2014-05-17 LAB — PROTIME-INR
INR: 2.34 — AB (ref 0.00–1.49)
PROTHROMBIN TIME: 24.9 s — AB (ref 11.6–15.2)

## 2014-05-17 LAB — GLUCOSE, CAPILLARY
GLUCOSE-CAPILLARY: 437 mg/dL — AB (ref 70–99)
GLUCOSE-CAPILLARY: 219 mg/dL — AB (ref 70–99)

## 2014-05-17 LAB — CBC
HCT: 39.9 % (ref 39.0–52.0)
Hemoglobin: 12.8 g/dL — ABNORMAL LOW (ref 13.0–17.0)
MCH: 30.1 pg (ref 26.0–34.0)
MCHC: 32.1 g/dL (ref 30.0–36.0)
MCV: 93.9 fL (ref 78.0–100.0)
PLATELETS: 246 10*3/uL (ref 150–400)
RBC: 4.25 MIL/uL (ref 4.22–5.81)
RDW: 16.8 % — AB (ref 11.5–15.5)
WBC: 10 10*3/uL (ref 4.0–10.5)

## 2014-05-17 LAB — PRO B NATRIURETIC PEPTIDE: Pro B Natriuretic peptide (BNP): 590.6 pg/mL — ABNORMAL HIGH (ref 0–125)

## 2014-05-17 MED ORDER — WARFARIN SODIUM 6 MG PO TABS: 6.0000 mg | ORAL_TABLET | ORAL | Status: DC

## 2014-05-17 MED ORDER — INSULIN GLARGINE 100 UNIT/ML ~~LOC~~ SOLN
40.0000 [IU] | Freq: Two times a day (BID) | SUBCUTANEOUS | Status: DC
Start: 2014-05-17 — End: 2014-05-17
  Filled 2014-05-17: qty 0.4

## 2014-05-17 MED ORDER — METOPROLOL TARTRATE 25 MG PO TABS: 25.0000 mg | ORAL_TABLET | Freq: Two times a day (BID) | ORAL | Status: DC

## 2014-05-17 MED ORDER — FUROSEMIDE 40 MG PO TABS: 40.0000 mg | ORAL_TABLET | Freq: Two times a day (BID) | ORAL | Status: DC

## 2014-05-17 MED ORDER — WARFARIN SODIUM 4 MG PO TABS
4.0000 mg | ORAL_TABLET | ORAL | Status: DC
  Filled 2014-05-17: qty 1

## 2014-05-17 MED ORDER — INSULIN REGULAR HUMAN (CONC) 500 UNIT/ML ~~LOC~~ SOLN
300.0000 [IU] | Freq: Three times a day (TID) | SUBCUTANEOUS | Status: DC
  Administered 2014-05-17: 300 [IU] via SUBCUTANEOUS
  Filled 2014-05-17 (×2): qty 20

## 2014-05-17 MED ORDER — OMEPRAZOLE 40 MG PO CPDR: 40.0000 mg | DELAYED_RELEASE_CAPSULE | Freq: Every day | ORAL | Status: DC

## 2014-05-17 MED ORDER — INSULIN REGULAR HUMAN (CONC) 500 UNIT/ML ~~LOC~~ SOLN
0.0000 [IU] | Freq: Three times a day (TID) | SUBCUTANEOUS | Status: DC
  Administered 2014-05-17: 300 [IU] via SUBCUTANEOUS
  Filled 2014-05-17 (×2): qty 20

## 2014-05-17 NOTE — Progress Notes (Signed)
Pt 's blood sugar elevated. Forrest Moron NP made aware with orders noted.

## 2014-05-17 NOTE — Progress Notes (Addendum)
Pt taken out via wheelchair, discharge instructions given, questions answered. IV and telemetry removed. Pt given U500 x2 from pharmacy and inpatient eye drops.

## 2014-05-17 NOTE — Progress Notes (Signed)
NP Forrest Moron made aware of pt's elevated blood sugar. Orders noted and carried out.

## 2014-05-17 NOTE — Discharge Summary (Signed)
Physician Discharge Summary  [name] JXB:[id number] DOB: 1941/01/27 DOA: 05/13/2014  PCP: Renato Shin, MD  Admit date: 05/13/2014 Discharge date: 05/17/2014  Time spent: >30 minutes  Recommendations for Outpatient Follow-up:  1. Reassess BP and adjust medications 2. BMET to follow electrolytes and renal function   BNP    Component Value Date/Time   PROBNP 590.6* 05/17/2014 0540   Filed Weights   05/15/14 0549 05/16/14 0628 05/17/14 0442  Weight: 142.429 kg (314 lb) 140.615 kg (310 lb) 140.3 kg (309 lb 4.9 oz)     Discharge Diagnoses:  Active Problems:   OBESITY-MORBID (>100')   OBSTRUCTIVE SLEEP APNEA   Type I (juvenile type) diabetes mellitus with renal manifestations, uncontrolled   Depressive disorder, not elsewhere classified   Shortness of breath   Diastolic CHF, acute on chronic   CKD (chronic kidney disease)   Discharge Condition: stable and improved. Will discharge home. No CP or SOB.  Diet recommendation: low sodium diet and low carbohydrates    History of present illness:  73 y.o. male Who comes in with several days of worsening SOB. He sleeps in a recliner for years so does not endorse orthopnea. He states he has increasing LE edema. He takes 20 mg of lasix daily but does not weigh himself. No fevers, no chills, no wheezing. C/o occasional heart racing.    Hospital Course:  1-Shortness of breath due to Acute on chronic Diastolic CHF and uncontrolled Atrial fibrillation:  -patient breathing a lot better on exertion now; also denies any SOB at rest. CTA bilaterally  -will discharge on lasix PO 40mg  BID  -cardiology on board and has performed cardioversion on 6/10; now with sinus rhythm and rate controlled -will discharge on metoprolol and will continue coumadin  2-CKD (chronic kidney disease): stage 3 at baseline  -stable  -Cr stable and at baseline -BMET in 1 week after discharge with PCP -continue lasix 40mg  BID by mouth  3-Depressive  disorder, not elsewhere classified: stable. Not on any meds at home.  -recommends follow up with PCP and psych evaluation if needed at discharge   4-Type I (juvenile type) diabetes mellitus with renal manifestations, uncontrolled: due to episodes of hypoglycemia and need for NPO status for procedures during this admission concentrated insulin was stopped and combination of lantus BID, SSI and meal coverage was used. Patient CBG's were elevated for one day with this transition. At discharge concentrated insulin has been resumed and patient encourage to follow low sugar diet.  5-OBSTRUCTIVE SLEEP APNEA: continue CPAP QHS   6-OBESITY-MORBID (>100'): discussion about low calorie diet sustained with patient   7-hypothyroidism: continue synthroid   8-GERD: continue PPI; dose adjusted to 40mg  daily  9-tongue bite with bleeding: bleeding stop. No further issues  Procedures:  2-D ECHO: 05/15/14  - Left ventricle: The cavity size was normal. Wall thickness was increased in a pattern of mild LVH. Systolic function was normal. The estimated ejection fraction was in the range of 60% to 65%. Indeterminant diastolic function (atrial fibrillation). Although no diagnostic regional wall motion abnormality was identified, this possibility cannot be completely excluded on the basis of this study. - Aortic valve: Trileaflet; moderately calcified leaflets. There was mild stenosis. Mean gradient (S): 11 mm Hg. Peak gradient (S): 18 mm Hg. Valve area (VTI): 1.63 cm^2. - Aorta: Borderline dilated aortic root. Aortic root dimension: 38 mm (ED). - Mitral valve: Moderately calcified annulus. Mildly calcified leaflets . - Left atrium: The atrium was mildly dilated. - Right ventricle: The  cavity size was normal. Pacer wire or catheter noted in right ventricle. Systolic function was normal. - Pulmonary arteries: No complete TR doppler jet so unable to estimate PA systolic pressure. - Systemic veins: IVC measured  1.9 cm with < 50% respirophasic variation, suggesting RA pressure 8 mmHg.   TEE/cardioversion: 6/9   Consultations:  Cardiology (electrophysiology)  Discharge Exam: Filed Vitals:   05/17/14 0442  BP: 177/85  Pulse: 70  Temp: 98 F (36.7 C)  Resp: 18   GEN- The patient is overweight and chronically ill appearing, alert and oriented x 3 today.  Head- normocephalic, atraumatic  Eyes- Sclera clear, conjunctiva pink  Ears- hearing intact  Oropharynx- clear  Neck- supple,  Lungs- Clear to ausculation bilaterally, normal work of breathing  Heart- irregular rate and rhythm  GI- soft, NT, ND, + BS  Extremities- no clubbing, cyanosis, + dependant edema with venous stasis changes  Skin- no rash or lesion  Psych- euthymic mood, full affect  Neuro- strength and sensation are intact   Discharge Instructions  Discharge Instructions   Diet - low sodium heart healthy    Complete by:  As directed      Discharge instructions    Complete by:  As directed   Take medications as prescribed Follow low sodium diet (less than 2 grams daily) Please follow with Dr. Rayann Heman and coumadin clinic as instructed Check your weight on daily basis            Medication List         acetaminophen 500 MG tablet  Commonly known as:  TYLENOL  Take 500 mg by mouth 2 (two) times daily as needed for moderate pain (pain).     Acidophilus Caps capsule  Take 1 capsule by mouth daily.     aspirin 325 MG tablet  Take 325 mg by mouth daily.     brimonidine 0.1 % Soln  Commonly known as:  ALPHAGAN P  Place 1 drop into the left eye 3 (three) times daily.     calcium carbonate 500 MG chewable tablet  Commonly known as:  TUMS - dosed in mg elemental calcium  Chew 2 tablets by mouth 2 (two) times daily as needed for indigestion or heartburn.     cholecalciferol 1000 UNITS tablet  Commonly known as:  VITAMIN D  Take 1,000 Units by mouth daily.     dextromethorphan 30 MG/5ML liquid  Commonly known  as:  DELSYM  Take 90 mg by mouth 2 (two) times daily as needed for cough.     dorzolamide-timolol 22.3-6.8 MG/ML ophthalmic solution  Commonly known as:  COSOPT  Place 1 drop into both eyes 2 (two) times daily.     Fish Oil 1000 MG Caps  Take 1,000 mg by mouth daily.     fludrocortisone 0.1 MG tablet  Commonly known as:  FLORINEF  Take 0.1 mg by mouth once a week. On Mondays     fluticasone 50 MCG/ACT nasal spray  Commonly known as:  FLONASE  Place 2 sprays into both nostrils daily as needed for allergies or rhinitis.     furosemide 40 MG tablet  Commonly known as:  LASIX  Take 1 tablet (40 mg total) by mouth 2 (two) times daily.     guaifenesin 400 MG Tabs tablet  Commonly known as:  HUMIBID E  Take 400 mg by mouth 2 (two) times daily as needed (congestion).     HUMULIN R 500 UNIT/ML Soln injection  Generic drug:  insulin regular human CONCENTRATED  Inject 100-300 Units into the skin 4 (four) times daily -  with meals and at bedtime. Inject into the skin 3 times daily (blood sugar > 200 = 300 units (drawn up as 60 units), blood sugar 100 - 200 = 150 units (drawn up as 30 units), blood sugar <100 = no insulin with meals and 100 units (drawn up as 20 units) with bedtime snack .     latanoprost 0.005 % ophthalmic solution  Commonly known as:  XALATAN  Place 1 drop into both eyes at bedtime.     levothyroxine 50 MCG tablet  Commonly known as:  SYNTHROID, LEVOTHROID  Take 50 mcg by mouth daily before breakfast.     metoprolol tartrate 25 MG tablet  Commonly known as:  LOPRESSOR  Take 1 tablet (25 mg total) by mouth 2 (two) times daily.     multivitamin with minerals Tabs tablet  Take 1 tablet by mouth daily.     omeprazole 40 MG capsule  Commonly known as:  PRILOSEC  Take 1 capsule (40 mg total) by mouth daily.     PROAIR HFA 108 (90 BASE) MCG/ACT inhaler  Generic drug:  albuterol  Inhale 2 puffs into the lungs every 6 (six) hours as needed for wheezing or shortness of  breath.     rosuvastatin 40 MG tablet  Commonly known as:  CRESTOR  Take 0.5 tablets (20 mg total) by mouth every evening.     traMADol 50 MG tablet  Commonly known as:  ULTRAM  Take 100 mg by mouth 2 (two) times daily as needed (pain).     vitamin C 500 MG tablet  Commonly known as:  ASCORBIC ACID  Take 500 mg by mouth daily.     warfarin 4 MG tablet  Commonly known as:  COUMADIN  Take 4-6 mg by mouth at bedtime. Take 1 tablet (4 mg) on Monday, Wednesday and Friday, take 1 1/2 tablet (6 mg) on Sunday, Tuesday, Thursday, Saturday       Allergies  Allergen Reactions  . Pioglitazone Swelling       Follow-up Information   Follow up with Renato Shin, MD In 10 days.   Specialty:  Endocrinology   Contact information:   [address] [place] [address] [phone]       The results of significant diagnostics from this hospitalization (including imaging, microbiology, ancillary and laboratory) are listed below for reference.    Significant Diagnostic Studies: Dg Chest 2 View  05/13/2014   CLINICAL DATA:  Shortness of Breath  EXAM: CHEST  2 VIEW  COMPARISON:  January 03, 2014  FINDINGS: There is underlying emphysematous change. There is no edema or consolidation. The heart size is normal. Pulmonary vascularity reflects underlying emphysematous change in is stable. Pacemaker leads are attached to the right atrium and right ventricle. No adenopathy. There is degenerative change in the thoracic spine.  IMPRESSION: Underlying emphysematous change.  No edema or consolidation.   Electronically Signed   By: Lowella Grip M.D.   On: 05/13/2014 15:07    Microbiology: Recent Results (from the past 240 hour(s))  MRSA PCR SCREENING     Status: Abnormal   Collection Time    05/13/14  7:02 PM      Result Value Ref Range Status   MRSA by PCR POSITIVE (*) NEGATIVE Final   Comment:            The GeneXpert MRSA Assay (FDA  approved for NASAL specimens     only),  is one component of a     comprehensive MRSA colonization     surveillance program. It is not     intended to diagnose MRSA     infection nor to guide or     monitor treatment for     MRSA infections.     RESULT CALLED TO, READ BACK BY AND VERIFIED WITH:     FUTRELL,M RN 05/13/14 2044 [place]     Labs: Basic Metabolic Panel:  Recent Labs Lab 05/13/14 1338 05/14/14 0410 05/15/14 0130 05/16/14 0345 05/17/14 0540  NA 137 140 139 134* 135*  K 4.5 4.1 3.7 4.3 4.8  CL 99 100 98 93* 93*  CO2 21 25 24 23 22   GLUCOSE 332* 175* 96 364* 423*  BUN 28* 32* 38* 40* 46*  CREATININE 1.47* 1.50* 1.54* 1.60* 1.70*  CALCIUM 9.1 9.1 9.5 9.0 9.4   CBC:  Recent Labs Lab 05/13/14 1338 05/15/14 0130 05/16/14 0345 05/17/14 0540  WBC 11.5* 11.4* 11.2* 10.0  HGB 13.0 13.1 12.8* 12.8*  HCT 40.0 39.8 39.6 39.9  MCV 93.0 91.7 92.1 93.9  PLT 274 267 276 246   Cardiac Enzymes:  Recent Labs Lab 05/13/14 1855 05/13/14 2250 05/14/14 0410  TROPONINI <0.30 <0.30 <0.30   BNP: BNP (last 3 results)  Recent Labs  11/09/13 1359 05/13/14 1338 05/17/14 0540  PROBNP 64.0 2142.0* 590.6*   CBG:  Recent Labs Lab 05/16/14 1318 05/16/14 1539 05/16/14 2145 05/17/14 0544 05/17/14 1126  GLUCAP 409* 375* 460* 437* 219*    Signed:  Barton Dubois  [facility] 05/17/2014, 12:10 PM   **Disclaimer: This note may have been dictated with voice recognition software. Similar sounding words can inadvertently be transcribed and this note may contain transcription errors which may not have been corrected upon publication of note.**

## 2014-05-17 NOTE — Progress Notes (Signed)
Patient voiced concerns about his elevated blood glucose. Patient states he is not getting the same amount of insulin as he takes at home and feels that the hospital is not controlling his blood glucose well. Tylene Fantasia, NP notified of patients concerns, per Tylene Fantasia, NP patients insulin has been adjusted during the night to address his elevated blood glucose. Tylene Fantasia, NP to notified patients primary MD this am to speak with patient about his concerns.

## 2014-05-17 NOTE — Care Management Note (Signed)
    Page 1 of 1   05/17/2014     3:17:18 PM CARE MANAGEMENT NOTE 05/17/2014  Patient:  DELTA, DESHMUKH   Account Number:  [id number]  Date Initiated:  05/17/2014  Documentation initiated by:  Mariann Laster  Subjective/Objective Assessment:   Afib, sob     Action/Plan:   CM to follow for dispositon needs   Anticipated DC Date:  05/17/2014   Anticipated DC Plan:  [place]  CM consult      [facility] Choice  HOME HEALTH   Choice offered to / List presented to:  C-1 Patient        [place] arranged  HH-1 RN  [place].   Status of service:  Completed, signed off Medicare Important Message given?  YES (If response is "NO", the following Medicare IM given date fields will be blank) Date Medicare IM given:  05/13/2014 Date Additional Medicare IM given:    Discharge Disposition:  HOME/SELF CARE  Per UR Regulation:  Reviewed for med. necessity/level of care/duration of stay  If discussed at [place] of Stay Meetings, dates discussed:    Comments:  Crystal Hutchinson RN, BSN, MSHL, CCM  Nurse - Case Manager,  ([facility] [place])  [phone]  05/17/2014 TEE and Cardioversion 05/16/2014 Social:  From home with wife. THN:  Active Med Review:  Warfarin Dispositon:  Home with HHS:  RN

## 2014-05-17 NOTE — Progress Notes (Signed)
SUBJECTIVE: The patient is doing well today.  At this time, he denies chest pain, shortness of breath, or any new concerns. Today he feels "much better"  S/p TEE guided DCCV yesterday.   INR 2.34 today  Pt's wife investigated cost of NOAC - he could afford either Eliquis or Pradaxa.    CURRENT MEDICATIONS: . acidophilus  1 capsule Oral Daily  . aspirin  325 mg Oral Daily  . atorvastatin  40 mg Oral q1800  . brimonidine  1 drop Left Eye TID  . Chlorhexidine Gluconate Cloth  6 each Topical Q0600  . cholecalciferol  1,000 Units Oral Daily  . dorzolamide-timolol  1 drop Both Eyes BID  . fludrocortisone  0.1 mg Oral Weekly  . furosemide  40 mg Oral BID  . insulin aspart  0-20 Units Subcutaneous TID WC  . insulin aspart  0-5 Units Subcutaneous QHS  . insulin aspart  10 Units Subcutaneous TID WC  . insulin glargine  40 Units Subcutaneous BID  . latanoprost  1 drop Both Eyes QHS  . levothyroxine  50 mcg Oral QAC breakfast  . metoprolol tartrate  25 mg Oral BID  . multivitamin with minerals  1 tablet Oral Daily  . mupirocin ointment  1 application Nasal BID  . pantoprazole  40 mg Oral Q breakfast  . sodium chloride  3 mL Intravenous Q12H  . vitamin C  500 mg Oral Daily  . Warfarin - Pharmacist Dosing Inpatient   Does not apply q1800      OBJECTIVE: Physical Exam: Filed Vitals:   05/16/14 1340 05/16/14 1434 05/16/14 2132 05/17/14 0442  BP: 116/46 122/69 144/87 177/85  Pulse: 70 72 71 70  Temp:  98 F (36.7 C) 97.7 F (36.5 C) 98 F (36.7 C)  TempSrc:  Oral Oral Oral  Resp: 13 16 18 18   Height:      Weight:    309 lb 4.9 oz (140.3 kg)  SpO2: 98% 99% 96% 100%    Intake/Output Summary (Last 24 hours) at 05/17/14 0654 Last data filed at 05/17/14 0600  Gross per 24 hour  Intake   1340 ml  Output   1300 ml  Net     40 ml    Telemetry reveals atrial pacing with intrinsic ventricular conduction  GEN- The patient is overweight and chronically ill appearing, alert and  oriented x 3 today.  Head- normocephalic, atraumatic  Eyes- Sclera clear, conjunctiva pink  Ears- hearing intact  Oropharynx- clear  Neck- supple,  Lungs- Clear to ausculation bilaterally, normal work of breathing  Heart- irregular rate and rhythm  GI- soft, NT, ND, + BS  Extremities- no clubbing, cyanosis, + dependant edema with venous stasis changes  Skin- no rash or lesion  Psych- euthymic mood, full affect  Neuro- strength and sensation are intact   LABS: Basic Metabolic Panel:  Recent Labs  05/16/14 0345 05/17/14 0540  NA 134* 135*  K 4.3 4.8  CL 93* 93*  CO2 23 22  GLUCOSE 364* 423*  BUN 40* 46*  CREATININE 1.60* 1.70*  CALCIUM 9.0 9.4   CBC:  Recent Labs  05/16/14 0345 05/17/14 0540  WBC 11.2* 10.0  HGB 12.8* 12.8*  HCT 39.6 39.9  MCV 92.1 93.9  PLT 276 246   Hemoglobin A1C:  Recent Labs  05/15/14 1113  HGBA1C 7.3*    RADIOLOGY: Dg Chest 2 View 05/13/2014   CLINICAL DATA:  Shortness of Breath  EXAM: CHEST  2 VIEW  COMPARISON:  January 03, 2014  FINDINGS: There is underlying emphysematous change. There is no edema or consolidation. The heart size is normal. Pulmonary vascularity reflects underlying emphysematous change in is stable. Pacemaker leads are attached to the right atrium and right ventricle. No adenopathy. There is degenerative change in the thoracic spine.  IMPRESSION: Underlying emphysematous change.  No edema or consolidation.   Electronically Signed   By: Lowella Grip M.D.   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 Persistent atrial fibrillation and atypical atrial flutter  Clinically improved with sinus rhythm Continue coumadin He may  wish to switch to eliquis when I see him in the office Continue coumadin for now  2. Sick sinus syndrome  Normal pacemaker function  3. OSA  Compliance with CPAP is necessary to maintain sinus long term   4. Obesity  Weight reduction encouraged   5. Acute diastolic dysfunction  Appears to be close to baseline Continue current diuretic dosing 2 gram sodium diet  6. CRI  Stable  No change required today   7. DM Per primary team  Needs follow-up with PCP with [facility] in 1-2 weeks  Follow up appt with Dr Rayann Heman 7-22 at 9:15 - pt aware.  Advised to follow up with [facility] next week.    Electrophysiology team to see as needed while here. Please call with questions.

## 2014-05-17 NOTE — Progress Notes (Signed)
ANTICOAGULATION CONSULT NOTE - Follow up [facility] for Coumadin  Indication: DVT , afib  Allergies  Allergen Reactions  . Pioglitazone Swelling    Patient Measurements: Height: 5\' 11"  (180.3 cm) Weight: 309 lb 4.9 oz (140.3 kg) IBW/kg (Calculated) : 75.3 Heparin dosing weight: 108 kg   Vital Signs: Temp: 98 F (36.7 C) (06/10 0442) Temp src: Oral (06/10 0442) BP: 177/85 mmHg (06/10 0442) Pulse Rate: 70 (06/10 0442)  Labs:  Recent Labs  05/15/14 0130 05/15/14 1457 05/15/14 2300 05/16/14 0345 05/17/14 0540  HGB 13.1  --   --  12.8* 12.8*  HCT 39.8  --   --  39.6 39.9  PLT 267  --   --  276 246  LABPROT 21.3*  --   --  23.5* 24.9*  INR 1.91*  --   --  2.17* 2.34*  HEPARINUNFRC 0.12* 0.26* 0.30  --   --   CREATININE 1.54*  --   --  1.60* 1.70*    Estimated Creatinine Clearance: 57.1 ml/min (by C-G formula based on Cr of 1.7).   Assessment:   41 YOM with DM and hx of DVT and afib on coumadin PTA, admitted 05/13/2014  for worsening SOB. Pharmacy currently managing coumadin and U-500 insulin. Blood glucose has run high. Looks like U500 was restarted. INR came back 2.34 this AM. CBC ok.   Plan  Coumadin 6mg  PO qday except 4mg  MWF Daily INR for now

## 2014-05-19 ENCOUNTER — Telehealth: Payer: Self-pay | Admitting: Surgery

## 2014-05-19 NOTE — Telephone Encounter (Signed)
Patient called to cancel his appointments for 06/05/2014, stating that his insurance will not pay as much and his deductible is far too high for him to keep this appointment, dpm

## 2014-05-22 ENCOUNTER — Emergency Department (HOSPITAL_COMMUNITY)
Admission: EM | Admit: 2014-05-22 | Discharge: 2014-05-22 | Disposition: A | Discharge status: Home / Self Care | Payer: Commercial Managed Care - HMO | Source: Home / Self Care

## 2014-05-22 ENCOUNTER — Encounter (HOSPITAL_COMMUNITY): Payer: Self-pay | Admitting: Emergency Medicine

## 2014-05-22 DIAGNOSIS — J029 Acute pharyngitis, unspecified: Secondary | ICD-10-CM

## 2014-05-22 LAB — POCT RAPID STREP A: Streptococcus, Group A Screen (Direct): NEGATIVE

## 2014-05-22 MED ORDER — MAGIC MOUTHWASH W/LIDOCAINE: 10.0000 mL | Freq: Four times a day (QID) | ORAL | Status: DC | PRN

## 2014-05-22 NOTE — Discharge Instructions (Signed)
Use medicine as prescribed and see dr byers if further problems.

## 2014-05-22 NOTE — ED Notes (Addendum)
Reportedly has sore throat after having had a endotracheal  procedure at [facility] for irregular HB evaluation a couple of days ago. C/o swelling and pain posterior nasopharynx area, but no pain w swallowing . Reports minimal  relief of discomfort w Chlorseptic

## 2014-05-22 NOTE — ED Provider Notes (Signed)
CSN: [id number]     Arrival date & time 05/22/14  1558 History   None    Chief Complaint  Patient presents with  . Sore Throat   (Consider location/radiation/quality/duration/timing/severity/associated sxs/prior Treatment) Patient is a 73 y.o. male presenting with pharyngitis. The history is provided by the patient.  Sore Throat This is a new problem. The current episode started more than 2 days ago. The problem has not changed since onset.Pertinent negatives include no chest pain and no abdominal pain. The symptoms are aggravated by swallowing.    Past Medical History  Diagnosis Date  . Diabetes mellitus   . COPD (chronic obstructive pulmonary disease)     CPAP  . Renal insufficiency   . Hyperlipemia   . Hypothyroidism   . Hyperkalemia   . 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 Sick sinus syndrome     s/p PPM by JA  . Pancreatitis   . Obesity   . OSA on CPAP     using CPAP although it is hard for him to tolerate  . CAD (coronary artery disease)   . Hypertension   . Depression   . Anxiety   . H/O Legionnaire's disease 2003  . History of blood clots     R groin  . Peripheral neuropathy   . Osteoarthritis     fingers  . GERD (gastroesophageal reflux disease)   . Anemia     hx of  . Pneumonia 2003  . Cancer 2014    bladder cancer AND RIGHT URETERAL CANCER  . CHF (congestive heart failure)   . Dysrhythmia     afib   Past Surgical History  Procedure Laterality Date  . Nasal septum surgery  1967  . Pacemaker placement  06/2010    [facility] [employer] RF DR, Model [id number] [id number] ( Serial number [id number])  . Thromboembolectomy and four compartment fasciotomy  2009    GROIN AREA  . Cardiac catheterization    . Cataract extraction Right   . Cholecystectomy  2012  . Insert / replace / remove pacemaker  2011  . Transurethral resection of bladder tumor with gyrus (turbt-gyrus) N/A 10/26/2013    Procedure: TRANSURETHRAL RESECTION OF BLADDER TUMOR WITH GYRUS  (TURBT-GYRUS);  Surgeon: Alexis Frock, MD;  Location: [facility];  Service: Urology;  Laterality: N/A;  . Cystoscopy w/ ureteral stent placement Right 10/26/2013    Procedure: CYSTOSCOPY WITH RETROGRADE PYELOGRAM/URETERAL STENT PLACEMENT;  Surgeon: Alexis Frock, MD;  Location: [facility];  Service: Urology;  Laterality: Right;  . Embolectomy Left 11/02/2013    Procedure: LEFT FEMORAL EMBOLECTOMY, LEFT FEMORAL ARTERY ENDARTERECTOMY WITH DACRON PATCH ANGIOPLASTY.;  Surgeon: Mal Misty, MD;  Location: [place];  Service: Vascular;  Laterality: Left;  . Transurethral resection of bladder tumor with gyrus (turbt-gyrus) N/A 01/11/2014    Procedure: TRANSURETHRAL RESECTION OF BLADDER TUMOR WITH GYRUS (TURBT-GYRUS);  Surgeon: Alexis Frock, MD;  Location: [facility];  Service: Urology;  Laterality: N/A;  . Cystoscopy with ureteroscopy and stent placement Right 01/11/2014    Procedure: CYSTOSCOPY WITH URETEROSCOPY ,RIGHT RETROGRADE AND STENT CHANGE AND LASER OF URETERAL TUMOR;  Surgeon: Alexis Frock, MD;  Location: [facility];  Service: Urology;  Laterality: Right;  . Holmium laser application Right 08/08/4781    Procedure: HOLMIUM LASER APPLICATION;  Surgeon: Alexis Frock, MD;  Location: [facility];  Service: Urology;  Laterality: Right;  . Tee without cardioversion N/A 05/16/2014    Procedure: TRANSESOPHAGEAL ECHOCARDIOGRAM (TEE);  Surgeon: Josue Hector, MD;  [facility] ENDOSCOPY;  Service: Cardiovascular;  Laterality: N/A;  . Cardioversion N/A 05/16/2014    Procedure: CARDIOVERSION;  Surgeon: Josue Hector, MD;  Location: [facility] ENDOSCOPY;  Service: Cardiovascular;  Laterality: N/A;   Family History  Problem Relation Age of Onset  . Liver cancer Mother     deceased age 47  . Cancer Mother     liver cancer  . Colon cancer Neg Hx   . Heart attack Father    History  Substance Use Topics  . Smoking status: Former Smoker -- 2.00 packs/day for 50 years    Types: Cigarettes    Quit date: 12/08/2006  . Smokeless  tobacco: Never Used  . Alcohol Use: No     Comment: quit 3 years ago    Review of Systems  Constitutional: Negative.   HENT: Positive for sore throat. Negative for postnasal drip, rhinorrhea, sneezing, trouble swallowing and voice change.   Cardiovascular: Negative for chest pain.  Gastrointestinal: Negative for abdominal pain.    Allergies  Pioglitazone  Home Medications   Prior to Admission medications   Medication Sig Start Date End Date Taking? Authorizing Provider  ACCU-CHEK SOFTCLIX LANCETS lancets Use to test blood sugar 3 times daily, as instructed. Dx code: 250.43 05/25/14   Renato Shin, MD  acetaminophen (TYLENOL) 500 MG tablet Take 500 mg by mouth 2 (two) times daily as needed for moderate pain (pain).     Historical Provider, MD  albuterol (PROAIR HFA) 108 (90 BASE) MCG/ACT inhaler Inhale 2 puffs into the lungs every 6 (six) hours as needed for wheezing or shortness of breath.    Historical Provider, MD  Alum & Mag Hydroxide-Simeth (MAGIC MOUTHWASH W/LIDOCAINE) SOLN Take 10 mLs by mouth 4 (four) times daily as needed for mouth pain. Gargle and spit 05/22/14   Billy Fischer, MD  Ascorbic Acid (VITAMIN C) 500 MG tablet Take 500 mg by mouth daily.     Historical Provider, MD  aspirin 325 MG tablet Take 325 mg by mouth daily.     Historical Provider, MD  Blood Glucose Monitoring Suppl (ACCU-CHEK AVIVA PLUS) W/DEVICE KIT Use to test blood sugar daily as instructed. Dx code: 250.43 05/25/14   Renato Shin, MD  brimonidine (ALPHAGAN P) 0.1 % SOLN Place 1 drop into the left eye 3 (three) times daily.     Historical Provider, MD  calcium carbonate (TUMS - DOSED IN MG ELEMENTAL CALCIUM) 500 MG chewable tablet Chew 2 tablets by mouth 2 (two) times daily as needed for indigestion or heartburn.    Historical Provider, MD  cholecalciferol (VITAMIN D) 1000 UNITS tablet Take 1,000 Units by mouth daily.    Historical Provider, MD  dextromethorphan (DELSYM) 30 MG/5ML liquid Take 90 mg by mouth 2  (two) times daily as needed for cough.    Historical Provider, MD  dorzolamide-timolol (COSOPT) 22.3-6.8 MG/ML ophthalmic solution Place 1 drop into both eyes 2 (two) times daily.  02/17/13   Historical Provider, MD  fludrocortisone (FLORINEF) 0.1 MG tablet Take 0.1 mg by mouth once a week. On Mondays    Historical Provider, MD  fluticasone (FLONASE) 50 MCG/ACT nasal spray Place 2 sprays into both nostrils daily as needed for allergies or rhinitis.    Historical Provider, MD  furosemide (LASIX) 40 MG tablet Take 1 tablet (40 mg total) by mouth 2 (two) times daily. 05/17/14   Barton Dubois, MD  glucose blood (ACCU-CHEK AVIVA PLUS) test strip Use to test blood  sugar 3 times daily, as instructed. Dx code: 250.43 05/25/14   Renato Shin, MD  guaifenesin (HUMIBID E) 400 MG TABS tablet Take 400 mg by mouth 2 (two) times daily as needed (congestion).  04/10/14   Historical Provider, MD  insulin regular human CONCENTRATED (HUMULIN R) 500 UNIT/ML SOLN injection Inject 100-300 Units into the skin 4 (four) times daily -  with meals and at bedtime. Inject into the skin 3 times daily (blood sugar > 200 = 300 units (drawn up as 60 units), blood sugar 100 - 200 = 150 units (drawn up as 30 units), blood sugar <100 = no insulin with meals and 100 units (drawn up as 20 units) with bedtime snack .    Historical Provider, MD  Lactobacillus (ACIDOPHILUS) CAPS capsule Take 1 capsule by mouth daily.    Historical Provider, MD  latanoprost (XALATAN) 0.005 % ophthalmic solution Place 1 drop into both eyes at bedtime.    Historical Provider, MD  levothyroxine (SYNTHROID, LEVOTHROID) 50 MCG tablet Take 50 mcg by mouth daily before breakfast.    Historical Provider, MD  metoprolol tartrate (LOPRESSOR) 25 MG tablet Take 1 tablet (25 mg total) by mouth 2 (two) times daily. 05/17/14   Barton Dubois, MD  Multiple Vitamin (MULTIVITAMIN WITH MINERALS) TABS tablet Take 1 tablet by mouth daily.    Historical Provider, MD  Omega-3 Fatty Acids  (FISH OIL) 1000 MG CAPS Take 1,000 mg by mouth daily.    Historical Provider, MD  omeprazole (PRILOSEC) 40 MG capsule Take 1 capsule (40 mg total) by mouth daily. 05/17/14   Barton Dubois, MD  rosuvastatin (CRESTOR) 40 MG tablet Take 0.5 tablets (20 mg total) by mouth every evening. 02/10/14   Renato Shin, MD  traMADol (ULTRAM) 50 MG tablet Take 100 mg by mouth 2 (two) times daily as needed (pain).    Historical Provider, MD  warfarin (COUMADIN) 4 MG tablet Take 4-6 mg by mouth at bedtime. Take 1 tablet (4 mg) on Monday, Wednesday and Friday, take 1 1/2 tablet (6 mg) on Sunday, Tuesday, Thursday, Saturday    Historical Provider, MD   BP 123/64  Pulse 70  Temp(Src) 98.2 F (36.8 C) (Oral)  Resp 16  SpO2 100% Physical Exam  Nursing note and vitals reviewed. Constitutional: He appears well-developed and well-nourished. No distress.  HENT:  Head: Normocephalic.  Right Ear: External ear normal.  Left Ear: External ear normal.  Mouth/Throat: Mucous membranes are normal. Oral lesions present. No uvula swelling. Posterior oropharyngeal erythema present. No oropharyngeal exudate.      ED Course  Procedures (including critical care time) Labs Review Labs Reviewed  CULTURE, GROUP A STREP  POCT RAPID STREP A ([facility]RG CARE ONLY)    Imaging Review No results found.   MDM   1. Ulcerative pharyngitis    Discussed with dr Janace Hoard, plans as rec., common from endo procedures.    Billy Fischer, MD 05/26/14 [phone]

## 2014-05-24 ENCOUNTER — Ambulatory Visit (INDEPENDENT_AMBULATORY_CARE_PROVIDER_SITE_OTHER): Payer: Commercial Managed Care - HMO | Admitting: General Practice

## 2014-05-24 DIAGNOSIS — I743 Embolism and thrombosis of arteries of the lower extremities: Secondary | ICD-10-CM

## 2014-05-24 DIAGNOSIS — Z5181 Encounter for therapeutic drug level monitoring: Secondary | ICD-10-CM

## 2014-05-24 LAB — CULTURE, GROUP A STREP

## 2014-05-24 LAB — POCT INR: INR: 3.9

## 2014-05-24 NOTE — Progress Notes (Signed)
Pre visit review using our clinic review tool, if applicable. No additional management support is needed unless otherwise documented below in the visit note. 

## 2014-05-25 ENCOUNTER — Encounter: Payer: Self-pay | Admitting: Internal Medicine

## 2014-05-25 ENCOUNTER — Other Ambulatory Visit: Payer: Self-pay | Admitting: *Deleted

## 2014-05-25 MED ORDER — ACCU-CHEK SOFTCLIX LANCETS MISC: Status: DC

## 2014-05-25 MED ORDER — ACCU-CHEK AVIVA PLUS W/DEVICE KIT: PACK | Status: DC

## 2014-05-25 MED ORDER — GLUCOSE BLOOD VI STRP: ORAL_STRIP | Status: DC

## 2014-05-25 NOTE — Telephone Encounter (Signed)
Pt's insurance co will not cover One Touch Ultra. Preferred, Accu-chek, True test or Prodigy.

## 2014-05-29 ENCOUNTER — Other Ambulatory Visit: Payer: Self-pay

## 2014-05-29 MED ORDER — GLUCOSE BLOOD VI STRP: ORAL_STRIP | Status: DC

## 2014-05-29 NOTE — Telephone Encounter (Signed)
Refill on onetouch ultra test strips

## 2014-06-05 ENCOUNTER — Encounter (HOSPITAL_COMMUNITY): Payer: Medicare Other

## 2014-06-05 ENCOUNTER — Ambulatory Visit: Payer: Medicare Other | Admitting: Family

## 2014-06-05 ENCOUNTER — Other Ambulatory Visit (HOSPITAL_COMMUNITY): Payer: Medicare Other

## 2014-06-07 ENCOUNTER — Ambulatory Visit (INDEPENDENT_AMBULATORY_CARE_PROVIDER_SITE_OTHER): Payer: Commercial Managed Care - HMO | Admitting: Endocrinology

## 2014-06-07 ENCOUNTER — Ambulatory Visit (INDEPENDENT_AMBULATORY_CARE_PROVIDER_SITE_OTHER): Payer: Commercial Managed Care - HMO | Admitting: General Practice

## 2014-06-07 ENCOUNTER — Encounter: Payer: Self-pay | Admitting: Endocrinology

## 2014-06-07 VITALS — BP 124/58 | HR 73 | Temp 98.3°F | Ht 70.0 in | Wt 319.0 lb

## 2014-06-07 DIAGNOSIS — Z5181 Encounter for therapeutic drug level monitoring: Secondary | ICD-10-CM

## 2014-06-07 DIAGNOSIS — E1065 Type 1 diabetes mellitus with hyperglycemia: Secondary | ICD-10-CM

## 2014-06-07 DIAGNOSIS — I509 Heart failure, unspecified: Secondary | ICD-10-CM

## 2014-06-07 DIAGNOSIS — R0602 Shortness of breath: Secondary | ICD-10-CM

## 2014-06-07 DIAGNOSIS — I4891 Unspecified atrial fibrillation: Secondary | ICD-10-CM

## 2014-06-07 DIAGNOSIS — M6530 Trigger finger, unspecified finger: Secondary | ICD-10-CM

## 2014-06-07 DIAGNOSIS — I743 Embolism and thrombosis of arteries of the lower extremities: Secondary | ICD-10-CM

## 2014-06-07 DIAGNOSIS — E1029 Type 1 diabetes mellitus with other diabetic kidney complication: Secondary | ICD-10-CM

## 2014-06-07 DIAGNOSIS — I5033 Acute on chronic diastolic (congestive) heart failure: Secondary | ICD-10-CM

## 2014-06-07 LAB — HEMOGLOBIN A1C: HEMOGLOBIN A1C: 7.6 % — AB (ref 4.6–6.5)

## 2014-06-07 LAB — BASIC METABOLIC PANEL
BUN: 29 mg/dL — ABNORMAL HIGH (ref 6–23)
CHLORIDE: 103 meq/L (ref 96–112)
CO2: 26 meq/L (ref 19–32)
Calcium: 9.1 mg/dL (ref 8.4–10.5)
Creatinine, Ser: 1.5 mg/dL (ref 0.4–1.5)
GFR: 50.56 mL/min — ABNORMAL LOW (ref >60.00)
Glucose, Bld: 332 mg/dL — ABNORMAL HIGH (ref 70–99)
POTASSIUM: 4.5 meq/L (ref 3.5–5.1)
Sodium: 137 mEq/L (ref 135–145)

## 2014-06-07 LAB — POCT INR: INR: 2.4

## 2014-06-07 LAB — BRAIN NATRIURETIC PEPTIDE: Pro B Natriuretic peptide (BNP): 77 pg/mL (ref 0.0–100.0)

## 2014-06-07 NOTE — Progress Notes (Signed)
Subjective:    Patient ID: [name], male    DOB: Apr 27, 1941, 73 y.o.   MRN: [id number]  HPI Pt was in the hospital 3 weeks ago for CHF exacerbation.  He has slight sob sensation in the chest, in the context of ambulation, but no assoc chest pain.   Pt returns for f/u of insulin-requiring DM (dx'ed 1988, when he presented with polyuria; he has has been on insulin since 1989; he has mild if any neuropathy of the lower extremities, but he has associated renal insufficiency and CAD; characterized by severe insulin resistance; he has never had pancreatitis, severe hypoglycemia or DKA; he takes multiple daily injections of U-500).  he brings a record of his cbg's which i have reviewed today.  It varies from 56-200's.  There is no trend throughout the day, except that he has mild hypoglycemia approx once a week, usually in the early hrs of the morning.   Past Medical History  Diagnosis Date  . Diabetes mellitus   . COPD (chronic obstructive pulmonary disease)     CPAP  . Renal insufficiency   . Hyperlipemia   . Hypothyroidism   . Hyperkalemia   . 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 Sick sinus syndrome     s/p PPM by JA  . Pancreatitis   . Obesity   . OSA on CPAP     using CPAP although it is hard for him to tolerate  . CAD (coronary artery disease)   . Hypertension   . Depression   . Anxiety   . H/O Legionnaire's disease 2003  . History of blood clots     R groin  . Peripheral neuropathy   . Osteoarthritis     fingers  . GERD (gastroesophageal reflux disease)   . Anemia     hx of  . Pneumonia 2003  . Cancer 2014    bladder cancer AND RIGHT URETERAL CANCER  . CHF (congestive heart failure)   . Dysrhythmia     afib    Past Surgical History  Procedure Laterality Date  . Nasal septum surgery  1967  . Pacemaker placement  06/2010    [facility] [employer] RF DR, Model [id number] ( Serial number [id number])  . Thromboembolectomy and four compartment fasciotomy  2009     GROIN AREA  . Cardiac catheterization    . Cataract extraction Right   . Cholecystectomy  2012  . Insert / replace / remove pacemaker  2011  . Transurethral resection of bladder tumor with gyrus (turbt-gyrus) N/A 10/26/2013    Procedure: TRANSURETHRAL RESECTION OF BLADDER TUMOR WITH GYRUS (TURBT-GYRUS);  Surgeon: Alexis Frock, MD;  Location: [facility];  Service: Urology;  Laterality: N/A;  . Cystoscopy w/ ureteral stent placement Right 10/26/2013    Procedure: CYSTOSCOPY WITH RETROGRADE PYELOGRAM/URETERAL STENT PLACEMENT;  Surgeon: Alexis Frock, MD;  Location: [facility];  Service: Urology;  Laterality: Right;  . Embolectomy Left 11/02/2013    Procedure: LEFT FEMORAL EMBOLECTOMY, LEFT FEMORAL ARTERY ENDARTERECTOMY WITH DACRON PATCH ANGIOPLASTY.;  Surgeon: Mal Misty, MD;  Location: [name];  Service: Vascular;  Laterality: Left;  . Transurethral resection of bladder tumor with gyrus (turbt-gyrus) N/A 01/11/2014    Procedure: TRANSURETHRAL RESECTION OF BLADDER TUMOR WITH GYRUS (TURBT-GYRUS);  Surgeon: Alexis Frock, MD;  [facility]: [facility];  Service: Urology;  Laterality: N/A;  . Cystoscopy with ureteroscopy and stent placement Right 01/11/2014    Procedure: CYSTOSCOPY WITH  URETEROSCOPY ,RIGHT RETROGRADE AND STENT CHANGE AND LASER OF URETERAL TUMOR;  Surgeon: Alexis Frock, MD;  Location: [facility];  Service: Urology;  Laterality: Right;  . Holmium laser application Right 7/0/6237    Procedure: HOLMIUM LASER APPLICATION;  Surgeon: Alexis Frock, MD;  Location: [facility];  Service: Urology;  Laterality: Right;  . Tee without cardioversion N/A 05/16/2014    Procedure: TRANSESOPHAGEAL ECHOCARDIOGRAM (TEE);  Surgeon: Josue Hector, MD;  Location: [facility];  Service: Cardiovascular;  Laterality: N/A;  . Cardioversion N/A 05/16/2014    Procedure: CARDIOVERSION;  Surgeon: Josue Hector, MD;  Location: [facility] ENDOSCOPY;  Service: Cardiovascular;  Laterality: N/A;    History   Social History  . Marital  Status: Married    Spouse Name: N/A    Number of Children: N/A  . 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 Smoking status: Former Smoker -- 2.00 packs/day for 50 years    Types: Cigarettes    Quit date: 12/08/2006  . Smokeless tobacco: Never Used  . Alcohol Use: No     Comment: quit 3 years ago  . Drug Use: No  . Sexual Activity: Not on file   Other Topics Concern  . Not on file   Social History Narrative  . No narrative on file    Current Outpatient Prescriptions on File Prior to Visit  Medication Sig Dispense Refill  . ACCU-CHEK SOFTCLIX LANCETS lancets Use to test blood sugar 3 times daily, as instructed. Dx code: 250.43  100 each  11  . acetaminophen (TYLENOL) 500 MG tablet Take 500 mg by mouth 2 (two) times daily as needed for moderate pain (pain).       Marland Kitchen albuterol (PROAIR HFA) 108 (90 BASE) MCG/ACT inhaler Inhale 2 puffs into the lungs every 6 (six) hours as needed for wheezing or shortness of breath.      . Ascorbic Acid (VITAMIN C) 500 MG tablet Take 500 mg by mouth daily.       Marland Kitchen aspirin 325 MG tablet Take 325 mg by mouth daily.       . Blood Glucose Monitoring Suppl (ACCU-CHEK AVIVA PLUS) W/DEVICE KIT Use to test blood sugar daily as instructed. Dx code: 250.43  1 kit  0  . brimonidine (ALPHAGAN P) 0.1 % SOLN Place 1 drop into the left eye 3 (three) times daily.       . cholecalciferol (VITAMIN D) 1000 UNITS tablet Take 1,000 Units by mouth daily.      Marland Kitchen dextromethorphan (DELSYM) 30 MG/5ML liquid Take 90 mg by mouth 2 (two) times daily as needed for cough.      . dorzolamide-timolol (COSOPT) 22.3-6.8 MG/ML ophthalmic solution Place 1 drop into both eyes 2 (two) times daily.       . fludrocortisone (FLORINEF) 0.1 MG tablet Take 0.1 mg by mouth once a week. On Mondays      . fluticasone (FLONASE) 50 MCG/ACT nasal spray Place 2 sprays into both nostrils daily as needed for allergies or rhinitis.        . furosemide (LASIX) 40 MG tablet Take 1 tablet (40 mg total) by mouth 2 (two) times daily.  60 tablet  1  . glucose blood test strip Use 3 times per day. Dx code 250.43  300 each  2  . insulin regular human CONCENTRATED (HUMULIN R) 500 UNIT/ML SOLN injection Inject 100-300 Units into the  skin 4 (four) times daily -  with meals and at bedtime. Inject into the skin 3 times daily (blood sugar > 200 = 300 units (drawn up as 60 units), blood sugar 100 - 200 = 150 units (drawn up as 30 units), blood sugar <100 = no insulin with meals and 100 units (drawn up as 20 units) with bedtime snack .      Marland Kitchen Lactobacillus (ACIDOPHILUS) CAPS capsule Take 1 capsule by mouth daily.      Marland Kitchen latanoprost (XALATAN) 0.005 % ophthalmic solution Place 1 drop into both eyes at bedtime.      Marland Kitchen levothyroxine (SYNTHROID, LEVOTHROID) 50 MCG tablet Take 50 mcg by mouth daily before breakfast.      . metoprolol tartrate (LOPRESSOR) 25 MG tablet Take 1 tablet (25 mg total) by mouth 2 (two) times daily.  60 tablet  1  . Multiple Vitamin (MULTIVITAMIN WITH MINERALS) TABS tablet Take 1 tablet by mouth daily.      . Omega-3 Fatty Acids (FISH OIL) 1000 MG CAPS Take 1,000 mg by mouth daily.      Marland Kitchen omeprazole (PRILOSEC) 40 MG capsule Take 1 capsule (40 mg total) by mouth daily.  30 capsule  1  . rosuvastatin (CRESTOR) 40 MG tablet Take 0.5 tablets (20 mg total) by mouth every evening.  30 tablet  2  . traMADol (ULTRAM) 50 MG tablet Take 100 mg by mouth 2 (two) times daily as needed (pain).      Marland Kitchen warfarin (COUMADIN) 4 MG tablet Take 4-6 mg by mouth at bedtime. Take 1 tablet (4 mg) on Monday, Wednesday and Friday, take 1 1/2 tablet (6 mg) on Sunday, Tuesday, Thursday, Saturday      . Alum & Mag Hydroxide-Simeth (MAGIC MOUTHWASH W/LIDOCAINE) SOLN Take 10 mLs by mouth 4 (four) times daily as needed for mouth pain. Gargle and spit  120 mL  1  . calcium carbonate (TUMS - DOSED IN MG ELEMENTAL CALCIUM) 500 MG chewable tablet Chew 2 tablets by  mouth 2 (two) times daily as needed for indigestion or heartburn.      . guaifenesin (HUMIBID E) 400 MG TABS tablet Take 400 mg by mouth 2 (two) times daily as needed (congestion).        No current facility-administered medications on file prior to visit.    Allergies  Allergen Reactions  . Pioglitazone Swelling    Family History  Problem Relation Age of Onset  . Liver cancer Mother     deceased age 40  . Cancer Mother     liver cancer  . Colon cancer Neg Hx   . Heart attack Father     BP 124/58  Pulse 73  Temp(Src) 98.3 F (36.8 C) (Oral)  Ht _0  (1.778 m)  Wt 319 lb (144.697 kg)  BMI 45.77 kg/m2  SpO2 97%   Review of Systems He denies hypoglycemia and weight change.  He has bilat trigger fingers.     Objective:   Physical Exam VITAL SIGNS:  See vs page GENERAL: no distress LUNGS:  Clear to auscultation Ext: 1+ bilat leg edema Hands: several trigger fingers  Lab Results  Component Value Date   CREATININE 1.5 06/07/2014   BUN 29* 06/07/2014   NA 137 06/07/2014   K 4.5 06/07/2014   CL 103 06/07/2014   CO2 26 06/07/2014   BNP=normal     Assessment & Plan:  CHF: much better. Renal insufficiency: stable Trigger fingers, new.     Patient is  advised the following: Patient Instructions  Please see a CHF specialist.  you will receive a phone call, about a day and time for an appointment. blood tests are being requested for you today.  We'll contact you with results. Please come back for a regular physical appointment in 2 months.  Please see a sports medicine specialist.  you will receive a phone call, about a day and time for an appointment

## 2014-06-07 NOTE — Progress Notes (Signed)
Pre visit review using our clinic review tool, if applicable. No additional management support is needed unless otherwise documented below in the visit note. 

## 2014-06-07 NOTE — Patient Instructions (Addendum)
Please see a CHF specialist.  you will receive a phone call, about a day and time for an appointment. blood tests are being requested for you today.  We'll contact you with results. Please come back for a regular physical appointment in 2 months.  Please see a sports medicine specialist.  you will receive a phone call, about a day and time for an appointment

## 2014-06-08 DIAGNOSIS — I509 Heart failure, unspecified: Secondary | ICD-10-CM

## 2014-06-08 DIAGNOSIS — E1149 Type 2 diabetes mellitus with other diabetic neurological complication: Secondary | ICD-10-CM

## 2014-06-08 DIAGNOSIS — I4891 Unspecified atrial fibrillation: Secondary | ICD-10-CM

## 2014-06-08 DIAGNOSIS — I5033 Acute on chronic diastolic (congestive) heart failure: Secondary | ICD-10-CM

## 2014-06-28 ENCOUNTER — Ambulatory Visit (INDEPENDENT_AMBULATORY_CARE_PROVIDER_SITE_OTHER): Payer: Commercial Managed Care - HMO | Admitting: *Deleted

## 2014-06-28 ENCOUNTER — Ambulatory Visit (INDEPENDENT_AMBULATORY_CARE_PROVIDER_SITE_OTHER): Payer: Commercial Managed Care - HMO | Admitting: Internal Medicine

## 2014-06-28 ENCOUNTER — Encounter: Payer: Self-pay | Admitting: Internal Medicine

## 2014-06-28 VITALS — BP 139/73 | HR 71 | Ht 70.0 in | Wt 299.1 lb

## 2014-06-28 DIAGNOSIS — Z5181 Encounter for therapeutic drug level monitoring: Secondary | ICD-10-CM | POA: Diagnosis not present

## 2014-06-28 DIAGNOSIS — I4892 Unspecified atrial flutter: Secondary | ICD-10-CM

## 2014-06-28 DIAGNOSIS — I498 Other specified cardiac arrhythmias: Secondary | ICD-10-CM | POA: Diagnosis not present

## 2014-06-28 DIAGNOSIS — I509 Heart failure, unspecified: Secondary | ICD-10-CM

## 2014-06-28 DIAGNOSIS — I495 Sick sinus syndrome: Secondary | ICD-10-CM

## 2014-06-28 DIAGNOSIS — E119 Type 2 diabetes mellitus without complications: Secondary | ICD-10-CM

## 2014-06-28 DIAGNOSIS — I5032 Chronic diastolic (congestive) heart failure: Secondary | ICD-10-CM | POA: Diagnosis not present

## 2014-06-28 DIAGNOSIS — I442 Atrioventricular block, complete: Secondary | ICD-10-CM

## 2014-06-28 DIAGNOSIS — Z950 Presence of cardiac pacemaker: Secondary | ICD-10-CM

## 2014-06-28 DIAGNOSIS — I743 Embolism and thrombosis of arteries of the lower extremities: Secondary | ICD-10-CM | POA: Diagnosis not present

## 2014-06-28 DIAGNOSIS — Z79899 Other long term (current) drug therapy: Secondary | ICD-10-CM

## 2014-06-28 DIAGNOSIS — I872 Venous insufficiency (chronic) (peripheral): Secondary | ICD-10-CM

## 2014-06-28 DIAGNOSIS — I4891 Unspecified atrial fibrillation: Secondary | ICD-10-CM

## 2014-06-28 DIAGNOSIS — I4819 Other persistent atrial fibrillation: Secondary | ICD-10-CM

## 2014-06-28 DIAGNOSIS — N259 Disorder resulting from impaired renal tubular function, unspecified: Secondary | ICD-10-CM

## 2014-06-28 LAB — MDC_IDC_ENUM_SESS_TYPE_INCLINIC
Brady Statistic RV Percent Paced: 93 %
Implantable Pulse Generator Model: 2210
Implantable Pulse Generator Serial Number: 7152830
Lead Channel Impedance Value: 375 Ohm
Lead Channel Pacing Threshold Amplitude: 0.75 V
Lead Channel Pacing Threshold Pulse Width: 0.5 ms
Lead Channel Pacing Threshold Pulse Width: 0.5 ms
Lead Channel Pacing Threshold Pulse Width: 0.5 ms
Lead Channel Sensing Intrinsic Amplitude: 8.1 mV
Lead Channel Setting Pacing Amplitude: 2 V
Lead Channel Setting Pacing Amplitude: 2.5 V
Lead Channel Setting Pacing Pulse Width: 0.5 ms
MDC IDC MSMT BATTERY REMAINING LONGEVITY: 62.4 mo
MDC IDC MSMT BATTERY VOLTAGE: 2.92 V
MDC IDC MSMT LEADCHNL RA IMPEDANCE VALUE: 400 Ohm
MDC IDC MSMT LEADCHNL RA PACING THRESHOLD AMPLITUDE: 0.75 V
MDC IDC MSMT LEADCHNL RA PACING THRESHOLD AMPLITUDE: 0.75 V
MDC IDC MSMT LEADCHNL RA PACING THRESHOLD PULSEWIDTH: 0.5 ms
MDC IDC MSMT LEADCHNL RA SENSING INTR AMPL: 2.9 mV
MDC IDC MSMT LEADCHNL RV PACING THRESHOLD AMPLITUDE: 0.75 V
MDC IDC SESS DTM: 20150722130113
MDC IDC SET LEADCHNL RV SENSING SENSITIVITY: 2 mV
MDC IDC STAT BRADY RA PERCENT PACED: 99 %

## 2014-06-28 LAB — POCT INR: INR: 2.8

## 2014-06-28 MED ORDER — FUROSEMIDE 40 MG PO TABS: 40.0000 mg | ORAL_TABLET | Freq: Every day | ORAL | Status: DC

## 2014-06-28 MED ORDER — APIXABAN 5 MG PO TABS: 5.0000 mg | ORAL_TABLET | Freq: Two times a day (BID) | ORAL | Status: DC

## 2014-06-28 NOTE — Patient Instructions (Signed)
   A full discussion of the nature of anticoagulants has been carried out.  A benefit/risk analysis has been presented to the patient, so that they understand the justification for choosing anticoagulation with Eliquis at this time.  The need for compliance is stressed.  Pt is aware to take the medication twice daily.  Side effects of potential bleeding are discussed, including unusual colored urine or stools, coughing up blood or coffee ground emesis, nose bleeds or serious fall or head trauma.  Discussed signs and symptoms of stroke. The patient should avoid any OTC items containing aspirin or ibuprofen.  Avoid alcohol consumption.   Call if any signs of abnormal bleeding.  Discussed financial obligations and resolved any difficulty in obtaining medication.  Next lab test test in 1 month.

## 2014-06-28 NOTE — Patient Instructions (Signed)
Your physician recommends that you schedule a follow-up appointment in: 6 weeks with Kathrene Alu and Dr Rayann Heman in 3 months   Your physician has recommended you make the following change in your medication:  1) Stop Warfarin---INR today in [facility] clinic 2) Stop Florinef 3) Stop Aspirin 4) Start Eliquis 5mg  twice dialy 5) Decrease Furosemide to 40mg  daily  You have been referred to Cardiac Rehab and Nutrition

## 2014-07-02 ENCOUNTER — Encounter: Payer: Self-pay | Admitting: Internal Medicine

## 2014-07-02 ENCOUNTER — Other Ambulatory Visit: Payer: Self-pay | Admitting: Endocrinology

## 2014-07-02 DIAGNOSIS — I495 Sick sinus syndrome: Secondary | ICD-10-CM | POA: Insufficient documentation

## 2014-07-02 DIAGNOSIS — I442 Atrioventricular block, complete: Secondary | ICD-10-CM | POA: Insufficient documentation

## 2014-07-02 NOTE — Progress Notes (Signed)
PCP:  Renato Shin, MD  The patient presents today for electrophysiology followup.  He was admitted with diastolic dysfunction.  He was cardioverted from afib.  His INR was labile at that time.  He struggles with sequelae from his morbid obesity.  He has chronic venous insufficiency and unsteadiness chronically.  He has become progressively debilitated since I have been taking care of him over the past few years.  He has extreme difficulty with walking from the chair to the exam table today.  Today, he denies symptoms of palpitations, chest pain, orthopnea, PND,  presyncope, syncope, or neurologic sequela.  The patient feels that he is tolerating medications without difficulties and is otherwise without complaint today.   Past Medical History  Diagnosis Date  . Diabetes mellitus   . COPD (chronic obstructive pulmonary disease)     CPAP  . Renal insufficiency   . Hyperlipemia   . Hypothyroidism   . Hyperkalemia   . 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 Sick sinus syndrome     s/p PPM by JA  . Pancreatitis   . Obesity   . OSA on CPAP     using CPAP although it is hard for him to tolerate  . CAD (coronary artery disease)   . Hypertension   . Depression   . Anxiety   . H/O Legionnaire's disease 2003  . History of blood clots     R groin  . Peripheral neuropathy   . Osteoarthritis     fingers  . GERD (gastroesophageal reflux disease)   . Anemia     hx of  . Pneumonia 2003  . Cancer 2014    bladder cancer AND RIGHT URETERAL CANCER  . CHF (congestive heart failure)   . Dysrhythmia     afib   Past Surgical History  Procedure Laterality Date  . Nasal septum surgery  1967  . Pacemaker placement  06/2010    [facility] [employer] RF DR, Model [id number] ( Serial number [id number])  . Thromboembolectomy and four compartment fasciotomy  2009    GROIN AREA  . Cardiac catheterization    . Cataract extraction Right   . Cholecystectomy  2012  . Insert / replace / remove  pacemaker  2011  . Transurethral resection of bladder tumor with gyrus (turbt-gyrus) N/A 10/26/2013    Procedure: TRANSURETHRAL RESECTION OF BLADDER TUMOR WITH GYRUS (TURBT-GYRUS);  Surgeon: Alexis Frock, MD;  Location: [facility];  Service: Urology;  Laterality: N/A;  . Cystoscopy w/ ureteral stent placement Right 10/26/2013    Procedure: CYSTOSCOPY WITH RETROGRADE PYELOGRAM/URETERAL STENT PLACEMENT;  Surgeon: Alexis Frock, MD;  Location: [facility];  Service: Urology;  Laterality: Right;  . Embolectomy Left 11/02/2013    Procedure: LEFT FEMORAL EMBOLECTOMY, LEFT FEMORAL ARTERY ENDARTERECTOMY WITH DACRON PATCH ANGIOPLASTY.;  Surgeon: Mal Misty, MD;  Location: [facility];  Service: Vascular;  Laterality: Left;  . Transurethral resection of bladder tumor with gyrus (turbt-gyrus) N/A 01/11/2014    Procedure: TRANSURETHRAL RESECTION OF BLADDER TUMOR WITH GYRUS (TURBT-GYRUS);  Surgeon: Alexis Frock, MD;  [facility];  Service: Urology;  Laterality: N/A;  . Cystoscopy with ureteroscopy and stent placement Right 01/11/2014    Procedure: CYSTOSCOPY WITH URETEROSCOPY ,RIGHT RETROGRADE AND STENT CHANGE AND LASER OF URETERAL TUMOR;  Surgeon: Alexis Frock, MD;  [facility]: [facility];  Service: Urology;  Laterality: Right;  . Holmium laser application Right 06/10/2594    Procedure: HOLMIUM LASER APPLICATION;  Surgeon: Alexis Frock, MD;  [facility];  Service: Urology;  Laterality: Right;  . Tee without cardioversion N/A 05/16/2014    Procedure: TRANSESOPHAGEAL ECHOCARDIOGRAM (TEE);  Surgeon: Josue Hector, MD;  Location: [facility] ENDOSCOPY;  Service: Cardiovascular;  Laterality: N/A;  . Cardioversion N/A 05/16/2014    Procedure: CARDIOVERSION;  Surgeon: Josue Hector, MD;  Location: [facility]OSCOPY;  Service: Cardiovascular;  Laterality: N/A;    Current Outpatient Prescriptions  Medication Sig Dispense Refill  . acetaminophen (TYLENOL) 500 MG tablet Take 500 mg by mouth 2 (two) times daily as needed for moderate pain  (pain).       Marland Kitchen albuterol (PROAIR HFA) 108 (90 BASE) MCG/ACT inhaler Inhale 2 puffs into the lungs every 6 (six) hours as needed for wheezing or shortness of breath.      . ALPRAZolam (XANAX) 0.25 MG tablet Take 0.25 mg by mouth at bedtime as needed for anxiety.      . Ascorbic Acid (VITAMIN C) 500 MG tablet Take 500 mg by mouth daily.       . brimonidine (ALPHAGAN P) 0.1 % SOLN Place 1 drop into the left eye 3 (three) times daily.       . calcium carbonate (TUMS - DOSED IN MG ELEMENTAL CALCIUM) 500 MG chewable tablet Chew 2 tablets by mouth 2 (two) times daily as needed for indigestion or heartburn.      . cholecalciferol (VITAMIN D) 1000 UNITS tablet Take 1,000 Units by mouth daily.      Marland Kitchen dextromethorphan (DELSYM) 30 MG/5ML liquid Take 90 mg by mouth 2 (two) times daily as needed for cough.      . dorzolamide-timolol (COSOPT) 22.3-6.8 MG/ML ophthalmic solution Place 1 drop into both eyes 2 (two) times daily.       . fluticasone (FLONASE) 50 MCG/ACT nasal spray Place 2 sprays into both nostrils daily as needed for allergies or rhinitis.      . furosemide (LASIX) 40 MG tablet Take 1 tablet (40 mg total) by mouth daily.  90 tablet  3  . guaifenesin (HUMIBID E) 400 MG TABS tablet Take 400 mg by mouth 2 (two) times daily as needed (congestion).       . insulin regular human CONCENTRATED (HUMULIN R) 500 UNIT/ML SOLN injection Inject 100-300 Units into the skin 4 (four) times daily. Inject into the skin 3 times daily (blood sugar > 200 = 300 units (drawn up as 60 units), blood sugar 100 - 200 = 150 units (drawn up as 30 units), blood sugar <100 = no insulin with meals and 100 units (drawn up as 20 units) with bedtime snack .      Marland Kitchen Lactobacillus (ACIDOPHILUS) CAPS capsule Take 1 capsule by mouth daily.      Marland Kitchen latanoprost (XALATAN) 0.005 % ophthalmic solution Place 1 drop into both eyes at bedtime.      Marland Kitchen levothyroxine (SYNTHROID, LEVOTHROID) 50 MCG tablet Take 50 mcg by mouth daily before breakfast.        . metoprolol tartrate (LOPRESSOR) 25 MG tablet Take 1 tablet (25 mg total) by mouth 2 (two) times daily.  60 tablet  1  . Multiple Vitamin (MULTIVITAMIN WITH MINERALS) TABS tablet Take 1 tablet by mouth daily.      . Omega-3 Fatty Acids (FISH OIL) 1000 MG CAPS Take 1,000 mg by mouth daily.      Marland Kitchen omeprazole (PRILOSEC) 40 MG capsule Take 1 capsule (40 mg total) by mouth daily.  30 capsule  1  .  rosuvastatin (CRESTOR) 40 MG tablet Take 0.5 tablets (20 mg total) by mouth every evening.  30 tablet  2  . traMADol (ULTRAM) 50 MG tablet Take 100 mg by mouth 2 (two) times daily as needed (pain).      Marland Kitchen apixaban (ELIQUIS) 5 MG TABS tablet Take 1 tablet (5 mg total) by mouth 2 (two) times daily.  60 tablet  11   No current facility-administered medications for this visit.    Allergies  Allergen Reactions  . Pioglitazone Swelling    History   Social History  . Marital Status: Married    Spouse Name: N/A    Number of Children: N/A  . 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 Smoking status: Former Smoker -- 2.00 packs/day for 50 years    Types: Cigarettes    Quit date: 12/08/2006  . Smokeless tobacco: Never Used  . Alcohol Use: No     Comment: quit 3 years ago  . Drug Use: No  . Sexual Activity: Not on file   Other Topics Concern  . Not on file   Social History Narrative  . No narrative on file   Family History  Problem Relation Age of Onset  . Liver cancer Mother     deceased age 72  . Cancer Mother     liver cancer  . Colon cancer Neg Hx   . Heart attack Father     ROS-  All systems are reviewed and are negative except as outlined in the HPI above  Physical Exam: Filed Vitals:   06/28/14 1044 06/28/14 1047 06/28/14 1049 06/28/14 1050  BP: 133/62 134/52 144/76 139/73  Pulse: 69 69 70 71  Height:      Weight:        GEN- The patient is morbidly obese and chronically ill appearing, alert and  oriented x 3 today.   Head- normocephalic, atraumatic Eyes-  Sclera clear, conjunctiva pink Ears- poor hearing (wears hearing aids) Oropharynx- clear with very dry MM Neck- supple,  Flat JVP Lungs- Clear to ausculation bilaterally, normal work of breathing Heart- Regular rate and rhythm  GI- soft, NT, ND, + BS Extremities- no clubbing, cyanosis, + venous stasis changes with chronic edema MS- walks very slowly with a walker, very debilitated Skin- no rash or lesion Psych- euthymic mood, full affect Neuro- very unsteady today Pacemaker pocket is well healed  Orthostatics today are negative Epic records including recent hospitalization are reviewed ekg today reveals AV pacing Pacemaker interrogation is reviewed today and normal (See paceart)  Assessment and Plan:  1. Persistent afib Maintaining sinus rhythm presently Stop asa Today, I discussed Coumadin as well as novel anticoagulants including Pradaxa, Xarelto, Savaysa, and Eliquis today as indicated for risk reduction in stroke and systemic emboli with nonvalvular atrial fibrillation.  Risks, benefits, and alternatives to each of these drugs were discussed at length today. Given labile INRs I would recommend NOAC therapy.  He will stop coumadin and start eliquis today  2. He appears dry on exam but is not overty orthostatic.  I think that volume depletion is partially the cause for his dizziness/ unsteadiness His dizziness has increased since his recent hospital discharge I will stop florinef and decrease lasix to 40mg  daily today Sodium restriction and daily weights are advised  3. Chronic diastolic dysfunction Appears dry today Reduce lasix as above The importance of sodium restriction and  daily weight is advised I will refer to cardiac rehab given his recent hospitalization for CHF.  Hopefully they can assist with mobility/ exercise/ diet and weight reduction efforts  4. Sick sinus syndrome/ complete heart block Normal  pacemaker function See Pace Art report No changes today  5. Chronic venous stasis changes His BLE edema is primarily due to obesity.  Weight loss is encouraged.  Support hose are also recommended.  6. Morbid obesity Body mass index is 42.92 kg/(m^2). I have strongly advised weight reduction.  I think that his weight will certainly impact both quality and quantity of life for him.  I have referred to nutrition (he has diabetes).  He is also referred to cardiac rehab.  7. OSA Compliance with CPAP is encouraged  8. Hypertensive cardio-renal disease Continue aggressive management Reduce lasix and follow closely  Merlin Return to see Truitt Merle in 6 weeks

## 2014-07-03 ENCOUNTER — Other Ambulatory Visit: Payer: Self-pay | Admitting: Urology

## 2014-07-04 ENCOUNTER — Ambulatory Visit: Payer: Commercial Managed Care - HMO | Admitting: Family Medicine

## 2014-07-04 ENCOUNTER — Other Ambulatory Visit: Payer: Commercial Managed Care - HMO

## 2014-07-04 ENCOUNTER — Ambulatory Visit (INDEPENDENT_AMBULATORY_CARE_PROVIDER_SITE_OTHER): Payer: Commercial Managed Care - HMO | Admitting: Family Medicine

## 2014-07-04 ENCOUNTER — Encounter: Payer: Self-pay | Admitting: Family Medicine

## 2014-07-04 VITALS — BP 120/70 | HR 70 | Ht 70.0 in | Wt 316.0 lb

## 2014-07-04 DIAGNOSIS — M6530 Trigger finger, unspecified finger: Secondary | ICD-10-CM

## 2014-07-04 DIAGNOSIS — M79609 Pain in unspecified limb: Secondary | ICD-10-CM

## 2014-07-04 DIAGNOSIS — M79646 Pain in unspecified finger(s): Secondary | ICD-10-CM

## 2014-07-04 NOTE — Progress Notes (Signed)
  [name] [address] [place], [place] [address] Phone: [phone] Subjective:    I'm seeing this patient by the request  of:  [name] Shin, MD   CC: finger pain  QJJ:HERDEYCXKG [name] is a 73 y.o. male coming in with complaint of trigger finger. Patient states that his right ring finger has been giving him the most difficulty. Patient was having some problems with his left index finger but that seems to be resolving. Patient states that the right index finger has been going on for approximately 3 months. Patient has no pain but then with certain activity it gets stuck in a flexed position and he has severe pain when he tries to straighten it. Patient with the severity of pain is 8/10. Only occurs intermittently. Patient can have days when it does not do it at all. States that he notices it right when he wakes up in the morning sometimes. Patient has been trying to wear a splint at night which has been a little bit helpful. Denies any numbness, any swelling, any loss of strength. No radiation of the pain. No injury.     Past medical history, social, surgical and family history all reviewed in electronic medical record.   Review of Systems: No headache, visual changes, nausea, vomiting, diarrhea, constipation, dizziness, abdominal pain, skin rash, fevers, chills, night sweats, weight loss, swollen lymph nodes, body aches, joint swelling, muscle aches, chest pain, shortness of breath, mood changes.   Objective Blood pressure 120/70, pulse 70, height 5\' 10"  (1.778 m), weight 316 lb (143.337 kg), SpO2 97.00%.  General: No apparent distress alert and oriented x3 mood and affect normal, dressed appropriately. Obese.  HEENT: Pupils equal, extraocular movements intact  Respiratory: Patient's speak in full sentences does have some mild shortness of breath at rest. Patient states that this is his baseline. Cardiovascular: +1 pitting edema of the large obese  bilaterally no erythema noted Skin: Warm dry intact with no signs of infection or rash on extremities or on axial skeleton.  Abdomen: Soft nontender obese Neuro: Cranial nerves II through XII are intact, neurovascularly intact in all extremities with 2+ DTRs and 2+ pulses.  Lymph: No lymphadenopathy of posterior or anterior cervical chain or axillae bilaterally.  Gait normal with good balance and coordination.  MSK:  Non tender with full range of motion and good stability and symmetric strength and tone of shoulders, elbows, wrist, hip, knee and ankles bilaterally. Difficult to assess secondary to patient's body habitus.  The hand exam on the right side shows the patient does have a nodule at the A2 pulley that is palpated on the palmar aspect of the hand of the ring finger. No triggering occurring at this time. Neurovascularly intact distally with good capillary refill. Contralateral hand unremarkable.  Limited muscular skeletal ultrasound was performed and interpreted by Hulan Saas, M today. Ultrasound showed patient does have a nodule as well as hypoechoic changes within the tendon sheath just proximal to the A2 pulley. No tear appreciated in the tendon. Impression:  Trigger finger    Impression and Recommendations:     This case required medical decision making of moderate complexity.

## 2014-07-04 NOTE — Patient Instructions (Addendum)
Very nice to meet you Wear splint the next 48 hours then only at night for 2 weeks.  Ice is your friend up to 10 minutes 2 times a day Start doing some little exercises or flexing easy in 3 days.  Come back in 3 weeks to make sure doing better. If not we can consider injection.

## 2014-07-04 NOTE — Assessment & Plan Note (Signed)
Discuss different treatment options the patient today. Patient has elected do conservative therapy and patient was put in a finger splint today. We discussed wear that for the next 48 hours and then nightly thereafter. We discussed an icing procedure as well as manual massage to be helpful. Patient will try these interventions and come back again in 3 weeks. If continuing to have trouble we'll consider doing an injection at that time. Patient is agreeable with the plan.

## 2014-07-06 ENCOUNTER — Other Ambulatory Visit: Payer: Self-pay | Admitting: *Deleted

## 2014-07-06 ENCOUNTER — Telehealth: Payer: Self-pay | Admitting: *Deleted

## 2014-07-06 MED ORDER — APIXABAN 5 MG PO TABS: 5.0000 mg | ORAL_TABLET | Freq: Two times a day (BID) | ORAL | Status: DC

## 2014-07-06 NOTE — Telephone Encounter (Signed)
Patient stated that he received a call from [facility] and was told that the rx for eliquis was on hold until they receive more information from Dr Rayann Heman. It could simply be that it was sent for #60 and mail order only takes 90 day supplies, so I resent it for #180. If not, maybe it needs prior authorization. Please advise. Thanks, MI

## 2014-07-07 ENCOUNTER — Other Ambulatory Visit: Payer: Self-pay | Admitting: Endocrinology

## 2014-07-11 ENCOUNTER — Other Ambulatory Visit: Payer: Self-pay

## 2014-07-11 MED ORDER — INSULIN REGULAR HUMAN (CONC) 500 UNIT/ML ~~LOC~~ SOLN: SUBCUTANEOUS | Status: DC

## 2014-07-13 ENCOUNTER — Telehealth: Payer: Self-pay | Admitting: Internal Medicine

## 2014-07-13 NOTE — Telephone Encounter (Signed)
New message    Home health calling     Would Dr. Rayann Heman sign out a prn visit that was done this week.   Also re- certification    order - CHF management.

## 2014-07-14 NOTE — Telephone Encounter (Addendum)
Spoke with Amy and she is going to follow up with him today.  She is going to make sure he is following through with his Nutrition class, cardiac rehab, weighing himself daily, and how he's feeling after the change in medications from 7/22 visit

## 2014-07-17 ENCOUNTER — Telehealth: Payer: Self-pay | Admitting: Endocrinology

## 2014-07-17 NOTE — Telephone Encounter (Signed)
Patient stated that [facility] sent a fax for meds furosemide it needs Dr [name] approval.

## 2014-07-17 NOTE — Telephone Encounter (Signed)
Form from [facility] received. Will fax once MD completes.

## 2014-07-19 ENCOUNTER — Encounter: Payer: Commercial Managed Care - HMO | Admitting: Internal Medicine

## 2014-07-25 ENCOUNTER — Ambulatory Visit: Payer: Commercial Managed Care - HMO | Admitting: Family Medicine

## 2014-07-26 ENCOUNTER — Ambulatory Visit (INDEPENDENT_AMBULATORY_CARE_PROVIDER_SITE_OTHER): Payer: Commercial Managed Care - HMO | Admitting: Pharmacist

## 2014-07-26 VITALS — Wt 311.0 lb

## 2014-07-26 DIAGNOSIS — I743 Embolism and thrombosis of arteries of the lower extremities: Secondary | ICD-10-CM

## 2014-07-26 DIAGNOSIS — I4891 Unspecified atrial fibrillation: Secondary | ICD-10-CM

## 2014-07-26 DIAGNOSIS — Z5181 Encounter for therapeutic drug level monitoring: Secondary | ICD-10-CM

## 2014-07-26 DIAGNOSIS — I4819 Other persistent atrial fibrillation: Secondary | ICD-10-CM

## 2014-07-26 LAB — BASIC METABOLIC PANEL
BUN: 35 mg/dL — ABNORMAL HIGH (ref 6–23)
CALCIUM: 9.2 mg/dL (ref 8.4–10.5)
CO2: 26 mEq/L (ref 19–32)
Chloride: 97 mEq/L (ref 96–112)
Creatinine, Ser: 1.6 mg/dL — ABNORMAL HIGH (ref 0.4–1.5)
GFR: 45.81 mL/min — AB (ref >60.00)
GLUCOSE: 233 mg/dL — AB (ref 70–99)
POTASSIUM: 4.2 meq/L (ref 3.5–5.1)
Sodium: 134 mEq/L — ABNORMAL LOW (ref 135–145)

## 2014-07-26 LAB — CBC
HCT: 41.1 % (ref 39.0–52.0)
HEMOGLOBIN: 13.5 g/dL (ref 13.0–17.0)
MCHC: 32.8 g/dL (ref 30.0–36.0)
MCV: 92.6 fl (ref 78.0–100.0)
Platelets: 261 10*3/uL (ref 150.0–400.0)
RBC: 4.44 Mil/uL (ref 4.22–5.81)
RDW: 16.2 % — AB (ref 11.5–15.5)
WBC: 10.4 10*3/uL (ref 4.0–10.5)

## 2014-07-26 NOTE — Progress Notes (Signed)
Pt was started on Eliquis 5 mg qd  for AFib on 06/28/14 per Dr. Rayann Heman request..    Reviewed patients medication list.  Pt is not currently on any combined P-gp and strong CYP3A4 inhibitors/inducers (ketoconazole, traconazole, ritonavir, carbamazepine, phenytoin, rifampin, St. John's wort).  Reviewed labs.  SCr 1.6 (stable), Weight 311 lbs, CrCl- 80 ml/min.  Dose appropriate based on Scr, age, and weight.   Hgb and HCT normal.  Patient states he is tolerating Eliquis 5 mg bid well.  He previously had chronic nosebleeds on warfarin, but denies any bleeding issues since taking Eliquis.  The cost is $164 for a 3 month supply, which patient tells me is acceptable.  He has a urology surgery on 08/23/14 and has already been given instruction to hold Eliquis 2 days prior by surgeon.  The surgeon will let patient know when to restart.    Getting BMET and CBC today in lab. Patient called with results, and will f/u as scheduled with Truitt Merle next month.  A full discussion of the nature of anticoagulants has been carried out.  A benefit/risk analysis has been presented to the patient, so that they understand the justification for choosing anticoagulation with Eliquis at this time.  The need for compliance is stressed.  Pt is aware to take the medication twice daily.  Side effects of potential bleeding are discussed, including unusual colored urine or stools, coughing up blood or coffee ground emesis, nose bleeds or serious fall or head trauma.  Discussed signs and symptoms of stroke. The patient should avoid any OTC items containing aspirin or ibuprofen.  Avoid alcohol consumption.   Call if any signs of abnormal bleeding.  Discussed financial obligations and resolved any difficulty in obtaining medication.

## 2014-07-27 ENCOUNTER — Ambulatory Visit (HOSPITAL_COMMUNITY)
Admission: RE | Admit: 2014-07-27 | Discharge: 2014-07-27 | Disposition: A | Discharge status: Home / Self Care | Payer: Medicare HMO | Source: Ambulatory Visit | Attending: Internal Medicine | Admitting: Internal Medicine

## 2014-07-27 VITALS — BP 112/50 | HR 73 | Wt 310.5 lb

## 2014-07-27 DIAGNOSIS — N183 Chronic kidney disease, stage 3 unspecified: Secondary | ICD-10-CM

## 2014-07-27 DIAGNOSIS — I359 Nonrheumatic aortic valve disorder, unspecified: Secondary | ICD-10-CM | POA: Insufficient documentation

## 2014-07-27 DIAGNOSIS — F3289 Other specified depressive episodes: Secondary | ICD-10-CM | POA: Insufficient documentation

## 2014-07-27 DIAGNOSIS — N189 Chronic kidney disease, unspecified: Secondary | ICD-10-CM | POA: Insufficient documentation

## 2014-07-27 DIAGNOSIS — E039 Hypothyroidism, unspecified: Secondary | ICD-10-CM | POA: Diagnosis not present

## 2014-07-27 DIAGNOSIS — F329 Major depressive disorder, single episode, unspecified: Secondary | ICD-10-CM | POA: Insufficient documentation

## 2014-07-27 DIAGNOSIS — E785 Hyperlipidemia, unspecified: Secondary | ICD-10-CM | POA: Diagnosis not present

## 2014-07-27 DIAGNOSIS — I509 Heart failure, unspecified: Secondary | ICD-10-CM | POA: Insufficient documentation

## 2014-07-27 DIAGNOSIS — E109 Type 1 diabetes mellitus without complications: Secondary | ICD-10-CM | POA: Diagnosis not present

## 2014-07-27 DIAGNOSIS — Z794 Long term (current) use of insulin: Secondary | ICD-10-CM | POA: Insufficient documentation

## 2014-07-27 DIAGNOSIS — K219 Gastro-esophageal reflux disease without esophagitis: Secondary | ICD-10-CM | POA: Insufficient documentation

## 2014-07-27 DIAGNOSIS — G4733 Obstructive sleep apnea (adult) (pediatric): Secondary | ICD-10-CM | POA: Diagnosis not present

## 2014-07-27 DIAGNOSIS — Z8249 Family history of ischemic heart disease and other diseases of the circulatory system: Secondary | ICD-10-CM | POA: Diagnosis not present

## 2014-07-27 DIAGNOSIS — Z950 Presence of cardiac pacemaker: Secondary | ICD-10-CM | POA: Diagnosis not present

## 2014-07-27 DIAGNOSIS — Z87891 Personal history of nicotine dependence: Secondary | ICD-10-CM | POA: Insufficient documentation

## 2014-07-27 DIAGNOSIS — Z8551 Personal history of malignant neoplasm of bladder: Secondary | ICD-10-CM | POA: Diagnosis not present

## 2014-07-27 DIAGNOSIS — I5032 Chronic diastolic (congestive) heart failure: Secondary | ICD-10-CM | POA: Diagnosis not present

## 2014-07-27 DIAGNOSIS — I495 Sick sinus syndrome: Secondary | ICD-10-CM | POA: Insufficient documentation

## 2014-07-27 DIAGNOSIS — I480 Paroxysmal atrial fibrillation: Secondary | ICD-10-CM

## 2014-07-27 DIAGNOSIS — E669 Obesity, unspecified: Secondary | ICD-10-CM | POA: Insufficient documentation

## 2014-07-27 DIAGNOSIS — I4891 Unspecified atrial fibrillation: Secondary | ICD-10-CM

## 2014-07-27 MED ORDER — FUROSEMIDE 40 MG PO TABS: ORAL_TABLET | ORAL | Status: DC

## 2014-07-27 NOTE — Patient Instructions (Signed)
You have been referred to Physical Therapy for balance training. They will call you to arrange orientation.  INCREASE Lasix to 40 mg in the AM and 20 mg in the PM  Labs needed in 10 days (BMET, BNP)   Your physician recommends that you schedule a follow-up appointment in: 1 month  Do the following things EVERYDAY: 1) Weigh yourself in the morning before breakfast. Write it down and keep it in a log. 2) Take your medicines as prescribed 3) Eat low salt foods-Limit salt (sodium) to 2000 mg per day.  4) Stay as active as you can everyday 5) Limit all fluids for the day to less than 2 liters 6)

## 2014-07-28 NOTE — Progress Notes (Signed)
Patient ID: [name], male   DOB: Aug 24, 1941, 73 y.o.   MRN: [id number] PCP: Dr. [name]  73 yo with history of type I diabetes, paroxysmal atrial fibrillation, and diastolic CHF presents for CHF clinic evaluation.  Patient was admitted in 6/15 with atrial fibrillation/RVR and acute diastolic CHF.  He was diuresed and underwent TEE-guided DCCV.   He is in NSR today and has not felt palpitations since his cardioversion.  Prior to that, he had had rare atrial fibrillation episodes.  He tends to be unsteady on his feet and walks with a walker.  Recently his Lasix was decreased to 40 mg daily from 40 mg bid and he has developed dyspnea after walking about 30-40 steps.  He has orthopnea and has slept in a recliner for "years."  No chest pain.  He has bendopnea.    Labs (8/15): K 4.2, creatinine 1.6  PMH: 1. CKD 2. Atrial fibrillation: Paroxysmal.  TEE-guided DCCV in 6/15. 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   3. Depression 4. Type I diabetes 5. OSA: Using CPAP 6. Hypothyroidism 7. Obesity 8. GERD 9. Aortic stenosis: Mild 10. Pacemaker: St Jude, for sinus node dysfunction 11. H/o bladder cancer s/p resection 12. Hyperlipidemia 13. GERD 14. Diastolic CHF: Echo (4/19) with EF 60-65%, mild LVH, normal RV size and systolic function, mild aortic stenosis.    SH: Prior smoker, lives in [place], rare ETOH.   FH: No premature CAD  ROS: All systems reviewed and negative except as per HPI.   Current Outpatient Prescriptions  Medication Sig Dispense Refill  . acetaminophen (TYLENOL) 500 MG tablet Take 500 mg by mouth 2 (two) times daily as needed for moderate pain (pain).       Marland Kitchen albuterol (PROAIR HFA) 108 (90 BASE) MCG/ACT inhaler Inhale 2 puffs into the lungs every 6 (six) hours as needed for wheezing or shortness of breath.      . ALPRAZolam (XANAX) 0.25 MG tablet Take 0.25 mg by mouth at bedtime as needed for anxiety.      Marland Kitchen apixaban (ELIQUIS) 5 MG TABS  tablet Take 1 tablet (5 mg total) by mouth 2 (two) times daily.  180 tablet  3  . Ascorbic Acid (VITAMIN C) 500 MG tablet Take 500 mg by mouth daily.       . brimonidine (ALPHAGAN P) 0.1 % SOLN Place 1 drop into the left eye 3 (three) times daily.       . calcium carbonate (TUMS - DOSED IN MG ELEMENTAL CALCIUM) 500 MG chewable tablet Chew 2 tablets by mouth 2 (two) times daily as needed for indigestion or heartburn.      . cholecalciferol (VITAMIN D) 1000 UNITS tablet Take 1,000 Units by mouth daily.      Marland Kitchen dextromethorphan (DELSYM) 30 MG/5ML liquid Take 90 mg by mouth 2 (two) times daily as needed for cough.      . dorzolamide-timolol (COSOPT) 22.3-6.8 MG/ML ophthalmic solution Place 1 drop into both eyes 2 (two) times daily.       . fluticasone (FLONASE) 50 MCG/ACT nasal spray Place 2 sprays into both nostrils daily as needed for allergies or rhinitis.      . furosemide (LASIX) 40 MG tablet Take one tab (40mg ) in the AM, and 1/2 tab (20mg ) in the PM  135 tablet  3  . guaifenesin (HUMIBID E) 400 MG TABS tablet Take 400 mg by mouth 2 (two) times daily as needed (congestion).       Marland Kitchen  insulin regular human CONCENTRATED (HUMULIN R) 500 UNIT/ML SOLN injection Inject 250 units 4 times a day.  90 vial  1  . Lactobacillus (ACIDOPHILUS) CAPS capsule Take 1 capsule by mouth daily.      Marland Kitchen latanoprost (XALATAN) 0.005 % ophthalmic solution Place 1 drop into both eyes at bedtime.      Marland Kitchen levothyroxine (SYNTHROID, LEVOTHROID) 50 MCG tablet Take 50 mcg by mouth daily before breakfast.      . metoprolol tartrate (LOPRESSOR) 25 MG tablet Take 1 tablet (25 mg total) by mouth 2 (two) times daily.  60 tablet  1  . Multiple Vitamin (MULTIVITAMIN WITH MINERALS) TABS tablet Take 1 tablet by mouth daily.      . Omega-3 Fatty Acids (FISH OIL) 1000 MG CAPS Take 1,000 mg by mouth daily.      Marland Kitchen omeprazole (PRILOSEC) 40 MG capsule Take 20 mg by mouth daily.      . rosuvastatin (CRESTOR) 40 MG tablet Take 0.5 tablets (20 mg  total) by mouth every evening.  30 tablet  2  . traMADol (ULTRAM) 50 MG tablet Take 100 mg by mouth 2 (two) times daily as needed (pain).       No current facility-administered medications for this encounter.    BP 112/50  Pulse 73  Wt 310 lb 8 oz (140.842 kg)  SpO2 94% General: NAD Neck: Thick, JVP 8-9 cm, no thyromegaly or thyroid nodule.  Lungs: Clear to auscultation bilaterally with normal respiratory effort. CV: Nondisplaced PMI.  Heart regular S1/S2, no S3/S4, no murmur.  Trace ankle edema.  No carotid bruit.  Normal pedal pulses.  Abdomen: Soft, nontender, no hepatosplenomegaly, no distention.  Skin: Intact without lesions or rashes.  Neurologic: Alert and oriented x 3.  Psych: Normal affect. Extremities: No clubbing or cyanosis.  HEENT: Normal.   Assessment/Plan: 1. Atrial fibrillation: Paroxysmal.  Tolerates poorly due to diastolic CHF.  He is in NSR and appears to have been in NSR since 6/15.  If atrial fibrillation recurs, I think that he will need antiarrhythmic.  Would consider dofetilide (versus amiodarone as his paced QT may be a bit long for dofetilide).  Needs compliance with CPAP to decrease risk of recurrence.  Has history of cardioembolism to femoral artery and is on apixaban.  Will also continue metoprolol. 2. Chronic diastolic CHF: I think that [name] remains mildly volume overloaded on exam.  I will have him increase Lasix to 40 mg qam, 20 qpm with BMET/BNP in 10 days and followup here in 1 month.  3. OSA: Continue CPAP.  4. CKD: Likely diabetic nephropathy.  Follow creatinine closely with diuresis. 5. Aortic stenosis: Mild on recent echo.   [name] 07/28/2014

## 2014-08-07 ENCOUNTER — Telehealth: Payer: Self-pay

## 2014-08-07 MED ORDER — TRAMADOL HCL 50 MG PO TABS: 100.0000 mg | ORAL_TABLET | Freq: Two times a day (BID) | ORAL | Status: DC | PRN

## 2014-08-07 NOTE — Telephone Encounter (Signed)
Received a refill request for Tramadol 50 mg. Pt was last seen on 06/07/2014.  Thanks!

## 2014-08-07 NOTE — Telephone Encounter (Signed)
i printed 

## 2014-08-07 NOTE — Telephone Encounter (Signed)
Rx sent per pt's request.  

## 2014-08-08 ENCOUNTER — Telehealth: Payer: Self-pay

## 2014-08-08 MED ORDER — OMEPRAZOLE 40 MG PO CPDR: 40.0000 mg | DELAYED_RELEASE_CAPSULE | Freq: Every day | ORAL | Status: DC

## 2014-08-08 NOTE — Telephone Encounter (Signed)
40 mg qd is correct

## 2014-08-08 NOTE — Telephone Encounter (Signed)
Received a fax from [facility] needing clarification on pt's Omeprazole prescription. Pharmacy is stating they have two prescriptions one for 20mg  and one for 40mg . Wanted to verify which dosage is correct.  Please advise, Thansks!

## 2014-08-08 NOTE — Telephone Encounter (Signed)
Pharmacy advised  

## 2014-08-09 ENCOUNTER — Encounter: Payer: Self-pay | Admitting: Endocrinology

## 2014-08-09 ENCOUNTER — Ambulatory Visit (INDEPENDENT_AMBULATORY_CARE_PROVIDER_SITE_OTHER): Payer: Commercial Managed Care - HMO | Admitting: Endocrinology

## 2014-08-09 VITALS — BP 118/58 | HR 70 | Temp 98.4°F | Wt 314.0 lb

## 2014-08-09 DIAGNOSIS — D509 Iron deficiency anemia, unspecified: Secondary | ICD-10-CM

## 2014-08-09 DIAGNOSIS — D649 Anemia, unspecified: Secondary | ICD-10-CM

## 2014-08-09 DIAGNOSIS — K7689 Other specified diseases of liver: Secondary | ICD-10-CM

## 2014-08-09 DIAGNOSIS — Z23 Encounter for immunization: Secondary | ICD-10-CM

## 2014-08-09 DIAGNOSIS — N259 Disorder resulting from impaired renal tubular function, unspecified: Secondary | ICD-10-CM

## 2014-08-09 DIAGNOSIS — Z0000 Encounter for general adult medical examination without abnormal findings: Secondary | ICD-10-CM

## 2014-08-09 DIAGNOSIS — E1029 Type 1 diabetes mellitus with other diabetic kidney complication: Secondary | ICD-10-CM

## 2014-08-09 DIAGNOSIS — E7800 Pure hypercholesterolemia, unspecified: Secondary | ICD-10-CM

## 2014-08-09 DIAGNOSIS — Z125 Encounter for screening for malignant neoplasm of prostate: Secondary | ICD-10-CM

## 2014-08-09 DIAGNOSIS — E1065 Type 1 diabetes mellitus with hyperglycemia: Secondary | ICD-10-CM

## 2014-08-09 DIAGNOSIS — D72829 Elevated white blood cell count, unspecified: Secondary | ICD-10-CM

## 2014-08-09 LAB — CBC WITH DIFFERENTIAL/PLATELET
BASOS ABS: 0 10*3/uL (ref 0.0–0.1)
BASOS PCT: 0.4 % (ref 0.0–3.0)
EOS ABS: 0.2 10*3/uL (ref 0.0–0.7)
Eosinophils Relative: 2.3 % (ref 0.0–5.0)
HCT: 40.9 % (ref 39.0–52.0)
Hemoglobin: 13.6 g/dL (ref 13.0–17.0)
Lymphocytes Relative: 23 % (ref 12.0–46.0)
Lymphs Abs: 2.1 10*3/uL (ref 0.7–4.0)
MCHC: 33.2 g/dL (ref 30.0–36.0)
MCV: 91.7 fl (ref 78.0–100.0)
MONO ABS: 0.7 10*3/uL (ref 0.1–1.0)
Monocytes Relative: 7.3 % (ref 3.0–12.0)
NEUTROS PCT: 67 % (ref 43.0–77.0)
Neutro Abs: 6 10*3/uL (ref 1.4–7.7)
PLATELETS: 247 10*3/uL (ref 150.0–400.0)
RBC: 4.46 Mil/uL (ref 4.22–5.81)
RDW: 16.5 % — AB (ref 11.5–15.5)
WBC: 8.9 10*3/uL (ref 4.0–10.5)

## 2014-08-09 LAB — BASIC METABOLIC PANEL
BUN: 35 mg/dL — ABNORMAL HIGH (ref 6–23)
CALCIUM: 9 mg/dL (ref 8.4–10.5)
CHLORIDE: 106 meq/L (ref 96–112)
CO2: 24 meq/L (ref 19–32)
Creatinine, Ser: 1.6 mg/dL — ABNORMAL HIGH (ref 0.4–1.5)
GFR: 47.17 mL/min — ABNORMAL LOW (ref >60.00)
Glucose, Bld: 102 mg/dL — ABNORMAL HIGH (ref 70–99)
Potassium: 3.6 mEq/L (ref 3.5–5.1)
Sodium: 141 mEq/L (ref 135–145)

## 2014-08-09 LAB — LIPID PANEL
Cholesterol: 123 mg/dL (ref 0–200)
HDL: 25.8 mg/dL — ABNORMAL LOW (ref >39.00)
NonHDL: 97.2
Total CHOL/HDL Ratio: 5
VLDL: 94 mg/dL — ABNORMAL HIGH (ref 0.0–40.0)

## 2014-08-09 LAB — HEPATIC FUNCTION PANEL
ALT: 51 U/L (ref 0–53)
AST: 58 U/L — ABNORMAL HIGH (ref 0–37)
Albumin: 3.5 g/dL (ref 3.5–5.2)
Alkaline Phosphatase: 100 U/L (ref 39–117)
BILIRUBIN TOTAL: 0.6 mg/dL (ref 0.2–1.2)
Bilirubin, Direct: 0 mg/dL (ref 0.0–0.3)
Total Protein: 7.7 g/dL (ref 6.0–8.3)

## 2014-08-09 LAB — IBC PANEL
IRON: 59 ug/dL (ref 42–165)
Saturation Ratios: 14.3 % — ABNORMAL LOW (ref 20.0–50.0)
TRANSFERRIN: 295.4 mg/dL (ref 212.0–360.0)

## 2014-08-09 LAB — LDL CHOLESTEROL, DIRECT: Direct LDL: 58.9 mg/dL

## 2014-08-09 LAB — TSH: TSH: 1.2 u[IU]/mL (ref 0.35–4.50)

## 2014-08-09 LAB — HEMOGLOBIN A1C: Hgb A1c MFr Bld: 7.9 % — ABNORMAL HIGH (ref 4.6–6.5)

## 2014-08-09 LAB — PSA: PSA: 0.61 ng/mL (ref 0.10–4.00)

## 2014-08-09 NOTE — Patient Instructions (Addendum)
blood tests are being requested for you today.  We'll contact you with results. please consider these measures for your health:  minimize alcohol.  do not use tobacco products.  have a colonoscopy at least every 10 years from age 73.  keep firearms safely stored.  always use seat belts.  have working smoke alarms in your home.  see an eye doctor and dentist regularly.  never drive under the influence of alcohol or drugs (including prescription drugs).  those with fair skin should take precautions against the sun. it is critically important to prevent falling down (keep floor areas well-lit, dry, and free of loose objects.  If you have a cane, walker, or wheelchair, you should use it, even for short trips around the house.  Also, try not to rush) Please come back for a follow-up appointment in 3 months.

## 2014-08-09 NOTE — Progress Notes (Signed)
Subjective:    Patient ID: [name], male    DOB: 1941-06-22, 73 y.o.   MRN: [id number]  HPI Pt is here for regular wellness examination, and is feeling pretty well in general, and says chronic med probs are stable, except as noted below Past Medical History  Diagnosis Date  . Diabetes mellitus   . COPD (chronic obstructive pulmonary disease)     CPAP  . Renal insufficiency   . Hyperlipemia   . Hypothyroidism   . Hyperkalemia   . 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 Sick sinus syndrome     s/p PPM by JA  . Pancreatitis   . Morbid obesity   . OSA on CPAP     using CPAP although it is hard for him to tolerate  . CAD (coronary artery disease)   . Hypertensive cardiovascular-renal disease   . Depression   . Anxiety   . H/O Legionnaire's disease 2003  . History of blood clots     R groin  . Peripheral neuropathy   . Osteoarthritis     fingers  . GERD (gastroesophageal reflux disease)   . Anemia     hx of  . Pneumonia 2003  . Cancer 2014    bladder cancer AND RIGHT URETERAL CANCER  . Diastolic dysfunction with chronic heart failure   . Persistent atrial fibrillation   . Atypical atrial flutter   . Venous insufficiency   . Complete heart block     Past Surgical History  Procedure Laterality Date  . Nasal septum surgery  1967  . Pacemaker placement  06/2010    [facility] Accent RF DR, Model [id number] ( Serial number [id number])  . Thromboembolectomy and four compartment fasciotomy  2009    GROIN AREA  . Cardiac catheterization    . Cataract extraction Right   . Cholecystectomy  2012  . Insert / replace / remove pacemaker  2011  . Transurethral resection of bladder tumor with gyrus (turbt-gyrus) N/A 10/26/2013    Procedure: TRANSURETHRAL RESECTION OF BLADDER TUMOR WITH GYRUS (TURBT-GYRUS);  Surgeon: Alexis Frock, MD;  Location: [facility];  Service: Urology;  Laterality: N/A;  . Cystoscopy w/ ureteral stent placement Right 10/26/2013    Procedure:  CYSTOSCOPY WITH RETROGRADE PYELOGRAM/URETERAL STENT PLACEMENT;  Surgeon: Alexis Frock, MD;  Location: [facility];  Service: Urology;  Laterality: Right;  . Embolectomy Left 11/02/2013    Procedure: LEFT FEMORAL EMBOLECTOMY, LEFT FEMORAL ARTERY ENDARTERECTOMY WITH DACRON PATCH ANGIOPLASTY.;  Surgeon: Mal Misty, MD;  Location: [place];  Service: Vascular;  Laterality: Left;  . Transurethral resection of bladder tumor with gyrus (turbt-gyrus) N/A 01/11/2014    Procedure: TRANSURETHRAL RESECTION OF BLADDER TUMOR WITH GYRUS (TURBT-GYRUS);  Surgeon: Alexis Frock, MD;  Location: [facility];  Service: Urology;  Laterality: N/A;  . Cystoscopy with ureteroscopy and stent placement Right 01/11/2014    Procedure: CYSTOSCOPY WITH URETEROSCOPY ,RIGHT RETROGRADE AND STENT CHANGE AND LASER OF URETERAL TUMOR;  Surgeon: Alexis Frock, MD;  Location: [facility];  Service: Urology;  Laterality: Right;  . Holmium laser application Right 06/12/2830    Procedure: HOLMIUM LASER APPLICATION;  Surgeon: Alexis Frock, MD;  Location: [facility];  Service: Urology;  Laterality: Right;  . Tee without cardioversion N/A 05/16/2014    Procedure: TRANSESOPHAGEAL ECHOCARDIOGRAM (TEE);  Surgeon: Josue Hector, MD;  Location: [place];  Service: Cardiovascular;  Laterality: N/A;  . Cardioversion N/A 05/16/2014    Procedure:  CARDIOVERSION;  Surgeon: Josue Hector, MD;  Location: [facility] ENDOSCOPY;  Service: Cardiovascular;  Laterality: N/A;    History   Social History  . Marital Status: Married    Spouse Name: N/A    Number of Children: N/A  . 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 Smoking status: Former Smoker -- 2.00 packs/day for 50 years    Types: Cigarettes    Quit date: 12/08/2006  . Smokeless tobacco: Never Used  . Alcohol Use: No     Comment: quit 3 years ago  . Drug Use: No  . Sexual Activity: Not on file   Other Topics Concern  . Not on file    Social History Narrative  . No narrative on file    Current Outpatient Prescriptions on File Prior to Visit  Medication Sig Dispense Refill  . acetaminophen (TYLENOL) 500 MG tablet Take 500 mg by mouth 2 (two) times daily as needed for moderate pain (pain).       Marland Kitchen albuterol (PROAIR HFA) 108 (90 BASE) MCG/ACT inhaler Inhale 2 puffs into the lungs every 6 (six) hours as needed for wheezing or shortness of breath.      . ALPRAZolam (XANAX) 0.25 MG tablet Take 0.25 mg by mouth at bedtime as needed for anxiety.      Marland Kitchen apixaban (ELIQUIS) 5 MG TABS tablet Take 1 tablet (5 mg total) by mouth 2 (two) times daily.  180 tablet  3  . Ascorbic Acid (VITAMIN C) 500 MG tablet Take 500 mg by mouth daily.       . brimonidine (ALPHAGAN P) 0.1 % SOLN Place 1 drop into the left eye 3 (three) times daily.       . calcium carbonate (TUMS - DOSED IN MG ELEMENTAL CALCIUM) 500 MG chewable tablet Chew 2 tablets by mouth 2 (two) times daily as needed for indigestion or heartburn.      . cholecalciferol (VITAMIN D) 1000 UNITS tablet Take 1,000 Units by mouth daily.      Marland Kitchen dextromethorphan (DELSYM) 30 MG/5ML liquid Take 90 mg by mouth 2 (two) times daily as needed for cough.      . dorzolamide-timolol (COSOPT) 22.3-6.8 MG/ML ophthalmic solution Place 1 drop into both eyes 2 (two) times daily.       . fluticasone (FLONASE) 50 MCG/ACT nasal spray Place 2 sprays into both nostrils daily as needed for allergies or rhinitis.      . furosemide (LASIX) 40 MG tablet Take one tab (40mg ) in the AM, and 1/2 tab (20mg ) in the PM  135 tablet  3  . guaifenesin (HUMIBID E) 400 MG TABS tablet Take 400 mg by mouth 2 (two) times daily as needed (congestion).       . insulin regular human CONCENTRATED (HUMULIN R) 500 UNIT/ML SOLN injection Inject 250 units 4 times a day.  90 vial  1  . Lactobacillus (ACIDOPHILUS) CAPS capsule Take 1 capsule by mouth daily.      Marland Kitchen latanoprost (XALATAN) 0.005 % ophthalmic solution Place 1 drop into both  eyes at bedtime.      Marland Kitchen levothyroxine (SYNTHROID, LEVOTHROID) 50 MCG tablet Take 50 mcg by mouth daily before breakfast.      . Multiple Vitamin (MULTIVITAMIN WITH MINERALS) TABS tablet Take 1 tablet by mouth daily.      . Omega-3 Fatty Acids (FISH OIL) 1000 MG CAPS Take 1,000  mg by mouth daily.      Marland Kitchen omeprazole (PRILOSEC) 40 MG capsule Take 1 capsule (40 mg total) by mouth daily.  90 capsule  1  . rosuvastatin (CRESTOR) 40 MG tablet Take 0.5 tablets (20 mg total) by mouth every evening.  30 tablet  2  . traMADol (ULTRAM) 50 MG tablet Take 2 tablets (100 mg total) by mouth 2 (two) times daily as needed (pain).  50 tablet  3   No current facility-administered medications on file prior to visit.    Allergies  Allergen Reactions  . Actos [Pioglitazone] Swelling    Family History  Problem Relation Age of Onset  . Liver cancer Mother     deceased age 48  . Cancer Mother     liver cancer  . Colon cancer Neg Hx   . Heart attack Father     BP 118/58  Pulse 70  Temp(Src) 98.4 F (36.9 C) (Oral)  Wt 314 lb (142.429 kg)  SpO2 97%  Review of Systems  Constitutional: Negative for fever and unexpected weight change.  HENT:       No change in chronic hearing loss  Eyes: Negative for visual disturbance.  Respiratory: Positive for wheezing.   Cardiovascular: Negative for chest pain.  Gastrointestinal: Negative for anal bleeding.  Endocrine: Negative for cold intolerance.  Genitourinary: Negative for hematuria.  Musculoskeletal: Positive for back pain.  Skin: Negative for rash.  Allergic/Immunologic: Positive for environmental allergies.  Neurological: Negative for syncope.  Hematological: Bruises/bleeds easily.  Psychiatric/Behavioral: Negative for dysphoric mood.      Objective:   Physical Exam VS: see vs page GEN: no distress HEAD: head: no deformity eyes: no periorbital swelling, no proptosis external nose and ears are normal mouth: no lesion seen NECK: supple, thyroid  is not enlarged CHEST WALL: no deformity LUNGS: clear to auscultation BREASTS:  No gynecomastia CV: reg rate and rhythm, no murmur ABD: abdomen is soft, nontender.  no hepatosplenomegaly.  not distended.  no hernia GENITALIA/RECTAL/PROSTATE: sees urology MUSCULOSKELETAL: muscle bulk and strength are grossly normal.  no obvious joint swelling.  gait is normal and steady PULSES: no carotid bruit NEURO:  cn 2-12 grossly intact.   readily moves all 4's.   SKIN:  Normal texture and temperature.  No rash or suspicious lesion is visible.       Assessment & Plan:  Wellness visit today, with problems stable, except as noted. we discussed code status.  pt requests full code, but would not want to be started or maintained on artificial life-support measures if there was not a reasonable chance of recovery. Patient is advised the following: Patient Instructions  blood tests are being requested for you today.  We'll contact you with results. please consider these measures for your health:  minimize alcohol.  do not use tobacco products.  have a colonoscopy at least every 10 years from age 25.  keep firearms safely stored.  always use seat belts.  have working smoke alarms in your home.  see an eye doctor and dentist regularly.  never drive under the influence of alcohol or drugs (including prescription drugs).  those with fair skin should take precautions against the sun. it is critically important to prevent falling down (keep floor areas well-lit, dry, and free of loose objects.  If you have a cane, walker, or wheelchair, you should use it, even for short trips around the house.  Also, try not to rush) Please come back for a follow-up appointment in 3 months.  SEPARATE EVALUATION FOLLOWS--EACH PROBLEM HERE IS NEW, NOT RESPONDING TO TREATMENT, OR POSES SIGNIFICANT RISK TO THE PATIENT'S HEALTH: HISTORY OF THE PRESENT ILLNESS: Pt returns for f/u of insulin-requiring DM (dx'ed 1988, when he presented  with polyuria; he has has been on insulin since 1989; he has mild if any neuropathy of the lower extremities, but he has associated renal insufficiency and CAD; characterized by severe insulin resistance; he has had several episodes of pancreatitis, most recently in 2013; he has never had severe hypoglycemia or DKA; he takes multiple daily injections of U-500). he brings a record of her cbg's which i have reviewed today.  It varies from 50-280, but the vast majority are in the 100's.  There is no trend throughout the day. PAST MEDICAL HISTORY reviewed and up to date today REVIEW OF SYSTEMS: Denies LOC PHYSICAL EXAMINATION: VITAL SIGNS:  See vs page GENERAL: no distress Pulses: dorsalis pedis intact bilat.  EXTEMITIES: no deformity. no ulcer on the feet. feet are of normal color and temp. 1+ bilat leg edema. Healed surgical scars at the right leg. There is bilateral onychomycosis. There is very dry skin and hyperpigmentation of the legs.  Neuro: sensation is intact to touch on the feet, but decreased from normal.  LAB/XRAY RESULTS: Lab Results  Component Value Date   HGBA1C 7.9* 08/09/2014  IMPRESSION: DM: this is the best control this pt should aim for, given his variable cbg's.  PLAN: Same insulin

## 2014-08-10 ENCOUNTER — Encounter: Payer: Medicare HMO | Attending: Endocrinology | Admitting: Dietician

## 2014-08-10 ENCOUNTER — Other Ambulatory Visit: Payer: Self-pay

## 2014-08-10 ENCOUNTER — Encounter: Payer: Self-pay | Admitting: Dietician

## 2014-08-10 DIAGNOSIS — Z794 Long term (current) use of insulin: Secondary | ICD-10-CM | POA: Insufficient documentation

## 2014-08-10 DIAGNOSIS — Z713 Dietary counseling and surveillance: Secondary | ICD-10-CM | POA: Diagnosis not present

## 2014-08-10 DIAGNOSIS — E1065 Type 1 diabetes mellitus with hyperglycemia: Principal | ICD-10-CM

## 2014-08-10 DIAGNOSIS — E1029 Type 1 diabetes mellitus with other diabetic kidney complication: Secondary | ICD-10-CM | POA: Insufficient documentation

## 2014-08-10 MED ORDER — METOPROLOL TARTRATE 25 MG PO TABS: 25.0000 mg | ORAL_TABLET | Freq: Two times a day (BID) | ORAL | Status: DC

## 2014-08-10 NOTE — Telephone Encounter (Signed)
Patient would like to change Pharmacy, [facility] metoprolol 25 mg. Call and let him know when it is sent.

## 2014-08-10 NOTE — Telephone Encounter (Signed)
ok 

## 2014-08-10 NOTE — Patient Instructions (Signed)
Use the Yellow Card to choose 3 servings of carbohydrate at each meal, plus or minus one serving. Choose 1-2 servings of carbohydrates at snacks and pair these foods with a protein. If using the Nutrition Facts Label look at Total Carbohydrates.  45-60 grams at meals and 15-30 at snacks. Keep up the great work with exercise!  Increase by 5 minutes every 1-2 weeks as comfortable until you reach 30-60 minutes each day.  Consider water aerobics. Use MyPlate to eat balanced meals.  Make 1/4 of your plate starches/grains, 1/4 of your plate lean protein, and 1/2 of your plate vegetables. Limit fried foods to 2-3 times a week.

## 2014-08-10 NOTE — Telephone Encounter (Signed)
See below, Medication is listed under another provider. Ok to refill? Thanks!

## 2014-08-10 NOTE — Telephone Encounter (Signed)
Medication sent per pt's request pt advised.

## 2014-08-10 NOTE — Progress Notes (Signed)
Medical Nutrition Therapy:  Appt start time: 7035 end time:  1615.   Assessment:  Primary concerns today: [name] was referred today for diabetes management and weight management.  His most recent A1c was 7.9%. He checks his BG 2-3 times a day and reports a range of 100-160.  His BG is highest first thing in the morning around 120-160, occasionally but not often it will be in the 200s.  He lives with his wife and they both do the food shopping and the food cooking.  He states he is very inactive each day due to restricted mobility, which his wife has as well.  They eat out approximately 2-4 times a week from places like [employer], [employer], and [employer].   He states he is not a big sweet eater.  He states 3-4 times a week he will wake up in the middle of the night and be hungry and make a sandwich, 1-2 times a month he reports having a low blood sugar at this time (less than 70) and will treat with OJ or peanut butter crackers.  He states his largest calorie intake is at breakfast.  His weight gain came with his mobility limitations and he states that he knows he needs to lose a lot of weight.  He has pancreatitis and takes insulin 4 times a day (1000 units split into 4 injections).  Patient has had diabetes for 27 years and has had no prior formal education other than what he has learned from his doctors and from reading.  History of heart disease, kidney disease, and glaucoma.  Learning Readiness:   Ready  MEDICATIONS: see list; Humulin R-500 Insulin   DIETARY INTAKE:  Usual eating pattern includes 3 meals and 1-2 snacks per day.    24-hr recall:  B (9-11 AM): 3 strips bacon or 2 sausage patties with 2 scrambled eggs and toast with a little margarine, black coffee OR bagel with cream cheese 2 times a week   Snk (AM): none  L (1-3 PM): ham or bologna sandwich with mayo and mustard, sometimes skips  Snk (5-6 PM): if skipped lunch he will have peanut butter crackers, cheese  sandwich D (7-8 PM): pasta, meatloaf, or steak and potatoes, a variety Snk (12 AM): middle of the night, sandwich and a glass of milk Beverages: flavored water, sometimes a diet soda, 1 cup of black coffee, skim milk  Usual physical activity: 5 lb dumbbell weights everyday 10 minutes, pedal machine 3-4 times a week for total of 10 minutes (not all at once, broken up in smaller units)  Progress Towards Goal(s):  In progress.   Nutritional Diagnosis:  [place]-3.3 Overweight/obesity As related to limited mobility/physical activity and occasional consumption of energy dense foods.  As evidenced by patient report and BMI >30.    Intervention:  Nutrition counseling for weight and diabetes management.  Described diabetes.  Described HgA1c score and ranges for diagnosis.  Stated that goal is HgA1c score of 7% or less.  Stated which foods effect blood sugar.  Stated which foods contain carbohydrates and appropriate portion sizes for these foods.  Stated that dietary goals for BG management are to choose carbohydrate foods in moderation at each meal and snack, and spread this intake evenly across the day.  Discussed low blood sugars and stated how to treat these with the "rule of 15".  Utilized MyPlate portion method to describe components of a healthy, balanced meal.  Stated benefits of weight loss for general health  and diabetes management.  Praised patient for making exercise a part of his daily routine and for avoiding sugary beverages.  Recommended increasing his exercise by increasing weight or length of time.  Suggested him and his wife consider water aerobics at the senior center.  Together we came up with the following goals:  Use the Yellow Card to choose 3 servings of carbohydrate at each meal, plus or minus one serving. Choose 1-2 servings of carbohydrates at snacks and pair these foods with a protein. If using the Nutrition Facts Label look at Total Carbohydrates.  45-60 grams at meals and 15-30 at  snacks. Keep up the great work with exercise!  Increase by 5 minutes every 1-2 weeks as comfortable until you reach 30-60 minutes each day.  Consider water aerobics. Use MyPlate to eat balanced meals.  Make 1/4 of your plate starches/grains, 1/4 of your plate lean protein, and 1/2 of your plate vegetables. Limit fried foods to 2-3 times a week.  Teaching Method Utilized all of the following: Visual Auditory Hands on  Handouts given during visit include:  Yellow Card Meal Planner  MyPlate Portion Method  Low Carb Snack Suggestions  Nutrition Facts Label  Barriers to learning/adherence to lifestyle change: limited mobility  Demonstrated degree of understanding via:  Teach Back   Monitoring/Evaluation:  Dietary intake, exercise, HgA1c, and body weight prn.

## 2014-08-11 ENCOUNTER — Other Ambulatory Visit: Payer: Self-pay | Admitting: Endocrinology

## 2014-08-15 ENCOUNTER — Encounter (HOSPITAL_COMMUNITY): Payer: Self-pay | Admitting: Pharmacy Technician

## 2014-08-16 ENCOUNTER — Encounter (HOSPITAL_COMMUNITY): Payer: Self-pay

## 2014-08-17 ENCOUNTER — Encounter (HOSPITAL_COMMUNITY)
Admission: RE | Admit: 2014-08-17 | Discharge: 2014-08-17 | Disposition: A | Discharge status: Home / Self Care | Payer: Commercial Managed Care - HMO | Source: Ambulatory Visit | Attending: Urology | Admitting: Urology

## 2014-08-17 ENCOUNTER — Encounter (HOSPITAL_COMMUNITY): Payer: Self-pay

## 2014-08-17 DIAGNOSIS — C669 Malignant neoplasm of unspecified ureter: Secondary | ICD-10-CM | POA: Diagnosis present

## 2014-08-17 DIAGNOSIS — C679 Malignant neoplasm of bladder, unspecified: Secondary | ICD-10-CM | POA: Insufficient documentation

## 2014-08-17 DIAGNOSIS — Z01812 Encounter for preprocedural laboratory examination: Secondary | ICD-10-CM | POA: Diagnosis present

## 2014-08-17 LAB — BASIC METABOLIC PANEL
ANION GAP: 19 — AB (ref 5–15)
BUN: 34 mg/dL — ABNORMAL HIGH (ref 6–23)
CHLORIDE: 99 meq/L (ref 96–112)
CO2: 22 meq/L (ref 19–32)
CREATININE: 1.43 mg/dL — AB (ref 0.50–1.35)
Calcium: 9.7 mg/dL (ref 8.4–10.5)
GFR calc Af Amer: 55 mL/min — ABNORMAL LOW (ref >90)
GFR calc non Af Amer: 48 mL/min — ABNORMAL LOW (ref >90)
Glucose, Bld: 214 mg/dL — ABNORMAL HIGH (ref 70–99)
Potassium: 4.1 mEq/L (ref 3.7–5.3)
Sodium: 140 mEq/L (ref 137–147)

## 2014-08-17 LAB — CBC
HEMATOCRIT: 39.6 % (ref 39.0–52.0)
Hemoglobin: 13 g/dL (ref 13.0–17.0)
MCH: 30.4 pg (ref 26.0–34.0)
MCHC: 32.8 g/dL (ref 30.0–36.0)
MCV: 92.5 fL (ref 78.0–100.0)
Platelets: 236 10*3/uL (ref 150–400)
RBC: 4.28 MIL/uL (ref 4.22–5.81)
RDW: 15.5 % (ref 11.5–15.5)
WBC: 10.3 10*3/uL (ref 4.0–10.5)

## 2014-08-17 LAB — SURGICAL PCR SCREEN
MRSA, PCR: POSITIVE — AB
Staphylococcus aureus: POSITIVE — AB

## 2014-08-17 NOTE — Pre-Procedure Instructions (Addendum)
08-17-14 EKG 7'15. Stress 6'12, Echo/ CXR 6'15 Epic. Labs 08-09-14 CBC?D, Cmp. HgbA1C , TSH, PSA, LP results in Epic. CBC, BMP repeated today. Last Pacemaker device check 06-28-14 Epic. Cardiac orders signed with chart -Dr. Rayann Heman. 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 Also aware will require Contact Isolation while here. Note faxed to Dr. Zettie Pho office. Note viewable labs in Epic.

## 2014-08-17 NOTE — Patient Instructions (Addendum)
[place]  08/17/2014   Your procedure is scheduled on: 9-16 -2015  Enter through [facility] Entrance and follow signs to [facility]. Arrive at    0900    AM.  Call this number if you have problems the morning of surgery: [phone]  Or Presurgical Testing [phone].   For Living Will and/or Health Care Power Attorney Forms: please provide copy for your medical record,may bring AM of surgery(Forms should be already notarized -we do not provide this service).(08-17-14 Yes/ No information preferred today).   For Cpap use: Bring mask and tubing only.   Do not eat food/ or drink: After Midnight.  .  Take these medicines the morning of surgery with A SIP OF WATER: Omeprazole. Xanax,Tramadol(if needed). Mucinex. Levothyroxine. Metoprolol. Use Inhaler/bring. Use Flonase(if not using Mupirocin ointment). Use eye drops as usual. Night before (use 1/2 PM dose Insulin- no insulin AM of procedure).  Use Eliquis as per MD instructions.   Do not wear jewelry, make-up or nail polish.  Do not wear lotions, powders, or perfumes. You may not  wear deodorant.  Do not shave 48 hours(2 days) prior to first CHG shower(legs and under arms).(Shaving face and neck okay.)  Do not bring valuables to the hospital.(Hospital is not responsible for lost valuables).  Contacts, dentures or removable bridgework, body piercing, hair pins may not be worn into surgery.  Leave suitcase in the car. After surgery it may be brought to your room.  For patients admitted to the hospital, checkout time is 11:00 AM the day of discharge.(Restricted visitors-Any Persons displaying flu-like symptoms or illness).    Patients discharged the day of surgery will not be allowed to drive home. Must have responsible person with you x 24 hours once discharged.  Name and phone number of your driver: [name] -spouse [phone]  Special Instructions: CHG(Chlorhedine 4%-"Hibiclens","Betasept","Aplicare") Shower Use Special  Wash: see special instructions.(avoid face and genitals)   Please read over the following fact sheets given: What is MRSA.     ______________________    [facility] - Preparing for Surgery Before surgery, you can play an important role.  Because skin is not sterile, your skin needs to be as free of germs as possible.  You can reduce the number of germs on your skin by washing with CHG (chlorahexidine gluconate) soap before surgery.  CHG is an antiseptic cleaner which kills germs and bonds with the skin to continue killing germs even after washing. Please DO NOT use if you have an allergy to CHG or antibacterial soaps.  If your skin becomes reddened/irritated stop using the CHG and inform your nurse when you arrive at Short Stay. Do not shave (including legs and underarms) for at least 48 hours prior to the first CHG shower.  You may shave your face/neck. Please follow these instructions carefully:  1.  Shower with CHG Soap the night before surgery and the  morning of Surgery.  2.  If you choose to wash your hair, wash your hair first as usual with your  normal  shampoo.  3.  After you shampoo, rinse your hair and body thoroughly to remove the  shampoo.                           4.  Use CHG as you would any other liquid soap.  You can apply chg directly  to the skin and wash  Gently with a scrungie or clean washcloth.  5.  Apply the CHG Soap to your body ONLY FROM THE NECK DOWN.   Do not use on face/ open                           Wound or open sores. Avoid contact with eyes, ears mouth and genitals (private parts).                       Wash face,  Genitals (private parts) with your normal soap.             6.  Wash thoroughly, paying special attention to the area where your surgery  will be performed.  7.  Thoroughly rinse your body with warm water from the neck down.  8.  DO NOT shower/wash with your normal soap after using and rinsing off  the CHG Soap.                9.   Pat yourself dry with a clean towel.            10.  Wear clean pajamas.            11.  Place clean sheets on your bed the night of your first shower and do not  sleep with pets. Day of Surgery : Do not apply any lotions/deodorants the morning of surgery.  Please wear clean clothes to the hospital/surgery center.  FAILURE TO FOLLOW THESE INSTRUCTIONS MAY RESULT IN THE CANCELLATION OF YOUR SURGERY PATIENT SIGNATURE_________________________________  NURSE SIGNATURE__________________________________  ________________________________________________________________________

## 2014-08-17 NOTE — Pre-Procedure Instructions (Signed)
08-17-14 EKG 7'15, Stress 6'12 Epic. Echo ^'28, CXR 6'15 Epic. 08-09-14 labs CCBC/d, CMP, Hgb A1C, TSH, PSA, LP -Epic. 08-17-14 CBC, BMP done today.

## 2014-08-21 ENCOUNTER — Ambulatory Visit (INDEPENDENT_AMBULATORY_CARE_PROVIDER_SITE_OTHER): Payer: Commercial Managed Care - HMO | Admitting: Nurse Practitioner

## 2014-08-21 ENCOUNTER — Encounter: Payer: Self-pay | Admitting: Nurse Practitioner

## 2014-08-21 VITALS — BP 130/78 | HR 73 | Ht 70.0 in | Wt 310.4 lb

## 2014-08-21 DIAGNOSIS — I4891 Unspecified atrial fibrillation: Secondary | ICD-10-CM

## 2014-08-21 DIAGNOSIS — I480 Paroxysmal atrial fibrillation: Secondary | ICD-10-CM

## 2014-08-21 DIAGNOSIS — I5032 Chronic diastolic (congestive) heart failure: Secondary | ICD-10-CM

## 2014-08-21 DIAGNOSIS — Z950 Presence of cardiac pacemaker: Secondary | ICD-10-CM

## 2014-08-21 NOTE — Patient Instructions (Signed)
I will see you in 3 months  Restart your Eliquis Wednesday night unless your urologist says different.  Restart the Lasix Thursday morning  Stay on your other medicines  Call the [place] office at [phone] if you have any questions, problems or concerns.

## 2014-08-21 NOTE — Progress Notes (Signed)
[name] Date of Birth: 1941-11-11 Medical Record #[id number]  History of Present Illness: Mr. [name] is seen back today for a 6 week check - seen for Dr. Rayann [name]. He is a 73 year old male with diastolic HF, morbid obesity, PAF with past cardioversion, venous insufficiency, CKD, anemia, hypothyroidism, DM, COPD, OSA on CPAP, HLD, SSS with PPM in place, CAD (no specifics noted), HTN and depression.   Seen here at the end of July - had been admitted earlier in the summer with diastolic heart failure and was cardioverted. Remained in sinus at that visit. Was switched to Eliquis from Coumadin. Florinef and Lasix were cut back - felt to be with volume depletion and looked "dry", PPM check was ok. Support stockings recommended as well as rehab.   Most of his issues center around his obesity - he has significant trouble with just minimal walking.   Comes in today. Here alone. He is having a cystoscopy per GU on Wednesday. He is off of his Eliquis and his Lasix due to this procedure as well as all of his supplements. He says he does not remember if he told Dr. Rayann [name] this and does not really remember why he is here today. Apparently no clearance needed. He still feels week. More congested now with a runny nose. Weight is down a few pounds. Has seen a nutritionist who is helping him count his carbs. No chest pain. No palpitations. Feels like his rhythm has been ok. Seems to be at his baseline for the most part. He thinks his swelling is about the same as well.    Current Outpatient Prescriptions  Medication Sig Dispense Refill  . acetaminophen (TYLENOL) 500 MG tablet Take 500 mg by mouth 2 (two) times daily as needed for moderate pain (pain).       Marland Kitchen albuterol (PROAIR HFA) 108 (90 BASE) MCG/ACT inhaler Inhale 2 puffs into the lungs every 6 (six) hours as needed for wheezing or shortness of breath.      . ALPRAZolam (XANAX) 0.25 MG tablet Take 0.25 mg by mouth 3 (three) times daily as needed for anxiety.       . brimonidine (ALPHAGAN P) 0.1 % SOLN Place 1 drop into the left eye 3 (three) times daily.       . calcium carbonate (TUMS - DOSED IN MG ELEMENTAL CALCIUM) 500 MG chewable tablet Chew 2 tablets by mouth 2 (two) times daily as needed for indigestion or heartburn.      . dextromethorphan (DELSYM) 30 MG/5ML liquid Take 90 mg by mouth 2 (two) times daily as needed for cough.      . dorzolamide-timolol (COSOPT) 22.3-6.8 MG/ML ophthalmic solution Place 1 drop into both eyes 2 (two) times daily.       . fluticasone (FLONASE) 50 MCG/ACT nasal spray Place 2 sprays into both nostrils daily as needed for allergies or rhinitis.      Marland Kitchen guaifenesin (HUMIBID E) 400 MG TABS tablet Take 400 mg by mouth 2 (two) times daily as needed (congestion).       . insulin regular human CONCENTRATED (HUMULIN R) 500 UNIT/ML SOLN injection Inject 250 units 4 times a day.  90 vial  1  . latanoprost (XALATAN) 0.005 % ophthalmic solution Place 1 drop into both eyes at bedtime.      Marland Kitchen levothyroxine (SYNTHROID, LEVOTHROID) 150 MCG tablet Take 50 mcg by mouth every morning.       . metoprolol tartrate (LOPRESSOR) 25 MG tablet Take 1  tablet (25 mg total) by mouth 2 (two) times daily.  180 tablet  1  . omeprazole (PRILOSEC) 20 MG capsule Take 20 mg by mouth every morning.      . rosuvastatin (CRESTOR) 40 MG tablet Take 0.5 tablets (20 mg total) by mouth every evening.  30 tablet  2  . traMADol (ULTRAM) 50 MG tablet Take 2 tablets (100 mg total) by mouth 2 (two) times daily as needed (pain).  50 tablet  3  . venlafaxine XR (EFFEXOR-XR) 150 MG 24 hr capsule Take 150 mg by mouth daily as needed (anxiety.).      Marland Kitchen apixaban (ELIQUIS) 5 MG TABS tablet Take 1 tablet (5 mg total) by mouth 2 (two) times daily.  180 tablet  3  . Ascorbic Acid (VITAMIN C) 500 MG tablet Take 500 mg by mouth daily.       . cholecalciferol (VITAMIN D) 1000 UNITS tablet Take 1,000 Units by mouth daily.      . furosemide (LASIX) 40 MG tablet Take one tab (40mg ) in  the AM, and 1/2 tab (20mg ) in the PM  135 tablet  3  . Lactobacillus (ACIDOPHILUS) CAPS capsule Take 1 capsule by mouth daily.      . Multiple Vitamin (MULTIVITAMIN WITH MINERALS) TABS tablet Take 1 tablet by mouth daily.      . Omega-3 Fatty Acids (FISH OIL) 1000 MG CAPS Take 1,000 mg by mouth every morning.        No current facility-administered medications for this visit.    Allergies  Allergen Reactions  . Actos [Pioglitazone] Swelling    Past Medical History  Diagnosis Date  . Diabetes mellitus   . COPD (chronic obstructive pulmonary disease)     CPAP  . Renal insufficiency   . Hyperlipemia   . Hypothyroidism   . Hyperkalemia   . 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 Sick sinus syndrome     s/p PPM by JA  . Pancreatitis   . Morbid obesity   . OSA on CPAP     using CPAP although it is hard for him to tolerate  . CAD (coronary artery disease)   . Hypertensive cardiovascular-renal disease   . Depression   . Anxiety   . H/O Legionnaire's disease 2003  . History of blood clots     R groin  . Peripheral neuropathy   . Osteoarthritis     fingers  . GERD (gastroesophageal reflux disease)   . Anemia     hx of  . Pneumonia 2003  . Cancer 2014    bladder cancer AND RIGHT URETERAL CANCER  . Diastolic dysfunction with chronic heart failure   . Persistent atrial fibrillation   . Atypical atrial flutter   . Venous insufficiency   . Complete heart block     cardioverted from A.Fib,hx. Sick sinus syndrome -Pacemaker implanted  . Pacemaker     implanted [employer] 7'11  . CHF (congestive heart failure)   . Gait difficulty     slow gait"ambulates with walker"    Past Surgical History  Procedure Laterality Date  . Nasal septum surgery  1967  . Pacemaker placement  06/2010    [facility] Accent RF DR, Model [phone] ( Serial number [id number])  . Thromboembolectomy and four compartment fasciotomy  2009    GROIN AREA  . Cataract extraction Right   .  Cholecystectomy  2012  . Insert / replace /  remove pacemaker  2011  . Transurethral resection of bladder tumor with gyrus (turbt-gyrus) N/A 10/26/2013    Procedure: TRANSURETHRAL RESECTION OF BLADDER TUMOR WITH GYRUS (TURBT-GYRUS);  Surgeon: Alexis Frock, MD;  Location: [facility];  Service: Urology;  Laterality: N/A;  . Cystoscopy w/ ureteral stent placement Right 10/26/2013    Procedure: CYSTOSCOPY WITH RETROGRADE PYELOGRAM/URETERAL STENT PLACEMENT;  Surgeon: Alexis Frock, MD;  Location: [facility];  Service: Urology;  Laterality: Right;  . Embolectomy Left 11/02/2013    Procedure: LEFT FEMORAL EMBOLECTOMY, LEFT FEMORAL ARTERY ENDARTERECTOMY WITH DACRON PATCH ANGIOPLASTY.;  Surgeon: Mal Misty, MD;  Location: [facility];  Service: Vascular;  Laterality: Left;  . Transurethral resection of bladder tumor with gyrus (turbt-gyrus) N/A 01/11/2014    Procedure: TRANSURETHRAL RESECTION OF BLADDER TUMOR WITH GYRUS (TURBT-GYRUS);  Surgeon: Alexis Frock, MD;  Location: [facility];  Service: Urology;  Laterality: N/A;  . Cystoscopy with ureteroscopy and stent placement Right 01/11/2014    Procedure: CYSTOSCOPY WITH URETEROSCOPY ,RIGHT RETROGRADE AND STENT CHANGE AND LASER OF URETERAL TUMOR;  Surgeon: Alexis Frock, MD;  Location: [facility];  Service: Urology;  Laterality: Right;  . Holmium laser application Right 04/11/9740    Procedure: HOLMIUM LASER APPLICATION;  Surgeon: Alexis Frock, MD;  Location: [facility];  Service: Urology;  Laterality: Right;  . Tee without cardioversion N/A 05/16/2014    Procedure: TRANSESOPHAGEAL ECHOCARDIOGRAM (TEE);  Surgeon: Josue Hector, MD;  Location: [facility];  Service: Cardiovascular;  Laterality: N/A;  . Cardioversion N/A 05/16/2014    Procedure: CARDIOVERSION;  Surgeon: Josue Hector, MD;  Location: [facility]COPY;  Service: Cardiovascular;  Laterality: N/A;  . Cardiac catheterization      11-02-13    History  Smoking status  . Former Smoker -- 2.00 packs/day for 50 years  .  Types: Cigarettes  . Quit date: 12/08/2006  Smokeless tobacco  . Never Used    History  Alcohol Use No    Comment: quit 3 years ago    Family History  Problem Relation Age of Onset  . Liver cancer Mother     deceased age 54  . Cancer Mother     liver cancer  . Colon cancer Neg Hx   . Heart attack Father     Review of Systems: The review of systems is per the HPI.  All other systems were reviewed and are negative.  Physical Exam: BP 130/78  Pulse 73  Ht 5\' 10"  (1.778 m)  Wt 310 lb 6.4 oz (140.797 kg)  BMI 44.54 kg/m2  SpO2 98% Patient is pleasant and in no acute distress. Looks chronically ill. His weight is down a few pounds. Remains morbidly obese. Skin is warm and dry. Color is normal.  HEENT is unremarkable. Normocephalic/atraumatic. PERRL. Sclera are nonicteric. Neck is supple. No masses. No JVD. Lungs are clear. Cardiac exam shows a regular rate and rhythm. His heart tones are very distant. Abdomen is obese but soft. Extremities are with chronic edema. Gait and ROM are intact. No gross neurologic deficits noted.  Wt Readings from Last 3 Encounters:  08/21/14 310 lb 6.4 oz (140.797 kg)  08/17/14 316 lb 9 oz (143.592 kg)  08/09/14 314 lb (142.429 kg)    LABORATORY DATA/PROCEDURES: EKG today shows what looks to be A pacing - reviewed with Dr. Rayann [name] here in the office today.    Lab Results  Component Value Date   WBC 10.3 08/17/2014   HGB 13.0 08/17/2014   HCT 39.6 08/17/2014   PLT 236  08/17/2014   GLUCOSE 214* 08/17/2014   CHOL 123 08/09/2014   TRIG 470.0 Triglyceride is over 400; calculations on Lipids are invalid.* 08/09/2014   HDL 25.80* 08/09/2014   LDLDIRECT 58.9 08/09/2014   LDLCALC  Value: UNABLE TO CALCULATE IF TRIGLYCERIDE OVER 400 mg/dL        Total Cholesterol/HDL:CHD Risk Coronary Heart Disease Risk Table                     Men   Women  1/2 Average Risk   3.4   3.3 05/07/2008   ALT 51 08/09/2014   AST 58* 08/09/2014   NA 140 08/17/2014   K 4.1 08/17/2014   CL 99  08/17/2014   CREATININE 1.43* 08/17/2014   BUN 34* 08/17/2014   CO2 22 08/17/2014   TSH 1.20 08/09/2014   PSA 0.61 08/09/2014   INR 2.8 06/28/2014   HGBA1C 7.9* 08/09/2014   MICROALBUR 170.0 Repeated and verified X2.* 06/27/2013    BNP (last 3 results)  Recent Labs  05/13/14 1338 05/17/14 0540 06/07/14 1402  PROBNP 2142.0* 590.6* 77.0   Echo Study Conclusions from June 2015  - Left ventricle: Wall thickness was increased in a pattern of severe LVH. The estimated ejection fraction was 55%. - Aortic valve: There was very mild stenosis. - Mitral valve: Mildly calcified annulus. There was mild regurgitation. - Left atrium: The atrium was dilated. No evidence of thrombus in the atrial cavity or appendage. - Right atrium: No evidence of thrombus in the atrial cavity or appendage. - Atrial septum: No defect or patent foramen ovale was identified. Echo contrast study showed no right-to-left atrial level shunt, following an increase in RA pressure induced by provocative maneuvers. - Tricuspid valve: No evidence of vegetation. - Pulmonic valve: No evidence of vegetation. - Impressions: No LAA thrombus Proceeded with succesful [place] under propofol anesthesia.  Impressions:  - No LAA thrombus Proceeded with succesful [facility] under propofol anesthesia.    Assessment / Plan: 1. PAF - prior cardioversion back in June of 2015 - seems to be holding in sinus.   2. Chronic diastolic HF -  Weight down a few pounds. Overall situation quite tenuous. I do not see this changing.   3. Morbid obesity  4. Upcoming cystoscopy -  Discussed with Dr. Rayann [name] - he is felt to be at least with moderate risk - will be available as needed.   5. Generalized weakness/fatigue - most likely multifactorial in nature. Has had his labs checked. Unfortunately, there is not much to offer.   See him back in 3 months. We will be available as needed.   Patient is agreeable to this plan and will call if any problems develop  in the interim.   Burtis Junes, RN, [place] [address] [place], [place]  [phone]

## 2014-08-22 MED ORDER — GENTAMICIN SULFATE 40 MG/ML IJ SOLN
470.0000 mg | INTRAVENOUS | Status: AC
  Administered 2014-08-23: 470 mg via INTRAVENOUS
  Filled 2014-08-22: qty 11.75

## 2014-08-23 ENCOUNTER — Encounter (HOSPITAL_COMMUNITY)
Admission: RE | Disposition: A | Discharge status: Home / Self Care | Payer: Self-pay | Source: Ambulatory Visit | Attending: Urology

## 2014-08-23 ENCOUNTER — Encounter (HOSPITAL_COMMUNITY): Payer: Medicare HMO | Admitting: Anesthesiology

## 2014-08-23 ENCOUNTER — Encounter (HOSPITAL_COMMUNITY): Payer: Self-pay | Admitting: *Deleted

## 2014-08-23 ENCOUNTER — Ambulatory Visit (HOSPITAL_COMMUNITY)
Admission: RE | Admit: 2014-08-23 | Discharge: 2014-08-23 | Disposition: A | Discharge status: Home / Self Care | Payer: Medicare HMO | Source: Ambulatory Visit | Attending: Urology | Admitting: Urology

## 2014-08-23 ENCOUNTER — Ambulatory Visit (HOSPITAL_COMMUNITY): Payer: Medicare HMO | Admitting: Anesthesiology

## 2014-08-23 DIAGNOSIS — F329 Major depressive disorder, single episode, unspecified: Secondary | ICD-10-CM | POA: Insufficient documentation

## 2014-08-23 DIAGNOSIS — I130 Hypertensive heart and chronic kidney disease with heart failure and stage 1 through stage 4 chronic kidney disease, or unspecified chronic kidney disease: Secondary | ICD-10-CM | POA: Insufficient documentation

## 2014-08-23 DIAGNOSIS — N200 Calculus of kidney: Secondary | ICD-10-CM | POA: Diagnosis not present

## 2014-08-23 DIAGNOSIS — Z87440 Personal history of urinary (tract) infections: Secondary | ICD-10-CM | POA: Diagnosis not present

## 2014-08-23 DIAGNOSIS — F411 Generalized anxiety disorder: Secondary | ICD-10-CM | POA: Diagnosis not present

## 2014-08-23 DIAGNOSIS — E119 Type 2 diabetes mellitus without complications: Secondary | ICD-10-CM | POA: Diagnosis not present

## 2014-08-23 DIAGNOSIS — I4891 Unspecified atrial fibrillation: Secondary | ICD-10-CM | POA: Diagnosis not present

## 2014-08-23 DIAGNOSIS — Z7901 Long term (current) use of anticoagulants: Secondary | ICD-10-CM | POA: Diagnosis not present

## 2014-08-23 DIAGNOSIS — Z950 Presence of cardiac pacemaker: Secondary | ICD-10-CM | POA: Diagnosis not present

## 2014-08-23 DIAGNOSIS — Z888 Allergy status to other drugs, medicaments and biological substances status: Secondary | ICD-10-CM | POA: Diagnosis not present

## 2014-08-23 DIAGNOSIS — E875 Hyperkalemia: Secondary | ICD-10-CM | POA: Insufficient documentation

## 2014-08-23 DIAGNOSIS — D649 Anemia, unspecified: Secondary | ICD-10-CM | POA: Diagnosis not present

## 2014-08-23 DIAGNOSIS — I509 Heart failure, unspecified: Secondary | ICD-10-CM | POA: Insufficient documentation

## 2014-08-23 DIAGNOSIS — Z87891 Personal history of nicotine dependence: Secondary | ICD-10-CM | POA: Diagnosis not present

## 2014-08-23 DIAGNOSIS — K219 Gastro-esophageal reflux disease without esophagitis: Secondary | ICD-10-CM | POA: Insufficient documentation

## 2014-08-23 DIAGNOSIS — N189 Chronic kidney disease, unspecified: Secondary | ICD-10-CM | POA: Diagnosis not present

## 2014-08-23 DIAGNOSIS — H409 Unspecified glaucoma: Secondary | ICD-10-CM | POA: Insufficient documentation

## 2014-08-23 DIAGNOSIS — J449 Chronic obstructive pulmonary disease, unspecified: Secondary | ICD-10-CM | POA: Insufficient documentation

## 2014-08-23 DIAGNOSIS — N4889 Other specified disorders of penis: Secondary | ICD-10-CM | POA: Diagnosis not present

## 2014-08-23 DIAGNOSIS — E039 Hypothyroidism, unspecified: Secondary | ICD-10-CM | POA: Diagnosis not present

## 2014-08-23 DIAGNOSIS — Z8559 Personal history of malignant neoplasm of other urinary tract organ: Secondary | ICD-10-CM | POA: Diagnosis not present

## 2014-08-23 DIAGNOSIS — F3289 Other specified depressive episodes: Secondary | ICD-10-CM | POA: Insufficient documentation

## 2014-08-23 DIAGNOSIS — E785 Hyperlipidemia, unspecified: Secondary | ICD-10-CM | POA: Insufficient documentation

## 2014-08-23 DIAGNOSIS — G4733 Obstructive sleep apnea (adult) (pediatric): Secondary | ICD-10-CM | POA: Insufficient documentation

## 2014-08-23 DIAGNOSIS — J4489 Other specified chronic obstructive pulmonary disease: Secondary | ICD-10-CM | POA: Insufficient documentation

## 2014-08-23 DIAGNOSIS — Z8551 Personal history of malignant neoplasm of bladder: Secondary | ICD-10-CM | POA: Insufficient documentation

## 2014-08-23 HISTORY — PX: CYSTOSCOPY/RETROGRADE/URETEROSCOPY: SHX5316

## 2014-08-23 LAB — GLUCOSE, CAPILLARY
GLUCOSE-CAPILLARY: 205 mg/dL — AB (ref 70–99)
GLUCOSE-CAPILLARY: 239 mg/dL — AB (ref 70–99)
GLUCOSE-CAPILLARY: 238 mg/dL — AB (ref 70–99)
Glucose-Capillary: 144 mg/dL — ABNORMAL HIGH (ref 70–99)
Glucose-Capillary: 230 mg/dL — ABNORMAL HIGH (ref 70–99)

## 2014-08-23 SURGERY — CYSTOSCOPY/RETROGRADE/URETEROSCOPY
Anesthesia: General | Laterality: Right

## 2014-08-23 MED ORDER — TRAMADOL HCL 50 MG PO TABS: 50.0000 mg | ORAL_TABLET | Freq: Four times a day (QID) | ORAL | Status: DC | PRN

## 2014-08-23 MED ORDER — ESMOLOL HCL 10 MG/ML IV SOLN
INTRAVENOUS | Status: AC
  Filled 2014-08-23: qty 10

## 2014-08-23 MED ORDER — PROMETHAZINE HCL 25 MG/ML IJ SOLN: 6.2500 mg | INTRAMUSCULAR | Status: DC | PRN

## 2014-08-23 MED ORDER — ROCURONIUM BROMIDE 100 MG/10ML IV SOLN
INTRAVENOUS | Status: AC
  Filled 2014-08-23: qty 1

## 2014-08-23 MED ORDER — ESMOLOL HCL 10 MG/ML IV SOLN
INTRAVENOUS | Status: DC | PRN
  Administered 2014-08-23: 10 mg via INTRAVENOUS

## 2014-08-23 MED ORDER — ONDANSETRON HCL 4 MG/2ML IJ SOLN
INTRAMUSCULAR | Status: DC | PRN
  Administered 2014-08-23: 4 mg via INTRAVENOUS

## 2014-08-23 MED ORDER — NEOSTIGMINE METHYLSULFATE 10 MG/10ML IV SOLN
INTRAVENOUS | Status: DC | PRN
  Administered 2014-08-23: 4 mg via INTRAVENOUS

## 2014-08-23 MED ORDER — SODIUM CHLORIDE 0.9 % IV SOLN
INTRAVENOUS | Status: DC | PRN
  Administered 2014-08-23: 11:00:00 via INTRAVENOUS

## 2014-08-23 MED ORDER — PROPOFOL 10 MG/ML IV BOLUS
INTRAVENOUS | Status: AC
  Filled 2014-08-23: qty 20

## 2014-08-23 MED ORDER — BACITRACIN ZINC 500 UNIT/GM EX OINT
TOPICAL_OINTMENT | CUTANEOUS | Status: AC
  Filled 2014-08-23: qty 28.35

## 2014-08-23 MED ORDER — GLYCOPYRROLATE 0.2 MG/ML IJ SOLN
INTRAMUSCULAR | Status: DC | PRN
  Administered 2014-08-23: .7 mg via INTRAVENOUS

## 2014-08-23 MED ORDER — IOHEXOL 300 MG/ML  SOLN
INTRAMUSCULAR | Status: DC | PRN
  Administered 2014-08-23: 25 mL

## 2014-08-23 MED ORDER — GLYCOPYRROLATE 0.2 MG/ML IJ SOLN
INTRAMUSCULAR | Status: AC
  Filled 2014-08-23: qty 4

## 2014-08-23 MED ORDER — INSULIN ASPART 100 UNIT/ML ~~LOC~~ SOLN
SUBCUTANEOUS | Status: AC
  Filled 2014-08-23: qty 1

## 2014-08-23 MED ORDER — INSULIN ASPART 100 UNIT/ML ~~LOC~~ SOLN
6.0000 [IU] | Freq: Once | SUBCUTANEOUS | Status: AC
  Administered 2014-08-23: 6 [IU] via SUBCUTANEOUS

## 2014-08-23 MED ORDER — BACITRACIN ZINC 500 UNIT/GM EX OINT
TOPICAL_OINTMENT | CUTANEOUS | Status: DC | PRN
  Administered 2014-08-23: 1 via TOPICAL

## 2014-08-23 MED ORDER — MIDAZOLAM HCL 2 MG/2ML IJ SOLN
INTRAMUSCULAR | Status: AC
  Filled 2014-08-23: qty 2

## 2014-08-23 MED ORDER — ROCURONIUM BROMIDE 100 MG/10ML IV SOLN
INTRAVENOUS | Status: DC | PRN
  Administered 2014-08-23: 30 mg via INTRAVENOUS

## 2014-08-23 MED ORDER — LACTATED RINGERS IV SOLN
INTRAVENOUS | Status: DC
  Administered 2014-08-23: 1000 mL via INTRAVENOUS

## 2014-08-23 MED ORDER — MIDAZOLAM HCL 5 MG/5ML IJ SOLN
INTRAMUSCULAR | Status: DC | PRN
  Administered 2014-08-23: .2 mg via INTRAVENOUS

## 2014-08-23 MED ORDER — FENTANYL CITRATE 0.05 MG/ML IJ SOLN
INTRAMUSCULAR | Status: AC
  Filled 2014-08-23: qty 2

## 2014-08-23 MED ORDER — FENTANYL CITRATE 0.05 MG/ML IJ SOLN
INTRAMUSCULAR | Status: DC | PRN
  Administered 2014-08-23 (×2): 50 ug via INTRAVENOUS

## 2014-08-23 MED ORDER — FENTANYL CITRATE 0.05 MG/ML IJ SOLN
25.0000 ug | INTRAMUSCULAR | Status: DC | PRN
  Administered 2014-08-23 (×2): 50 ug via INTRAVENOUS

## 2014-08-23 MED ORDER — SENNOSIDES-DOCUSATE SODIUM 8.6-50 MG PO TABS: 1.0000 | ORAL_TABLET | Freq: Two times a day (BID) | ORAL | Status: DC

## 2014-08-23 MED ORDER — ATROPINE SULFATE 0.4 MG/ML IJ SOLN
INTRAMUSCULAR | Status: AC
  Filled 2014-08-23: qty 1

## 2014-08-23 MED ORDER — SODIUM CHLORIDE 0.9 % IR SOLN
Status: DC | PRN
  Administered 2014-08-23: 1000 mL via INTRAVESICAL

## 2014-08-23 MED ORDER — NEOSTIGMINE METHYLSULFATE 10 MG/10ML IV SOLN
INTRAVENOUS | Status: AC
  Filled 2014-08-23: qty 1

## 2014-08-23 MED ORDER — ONDANSETRON HCL 4 MG/2ML IJ SOLN
INTRAMUSCULAR | Status: AC
  Filled 2014-08-23: qty 2

## 2014-08-23 MED ORDER — SODIUM CHLORIDE 0.9 % IV SOLN: INTRAVENOUS | Status: DC

## 2014-08-23 MED ORDER — PROPOFOL 10 MG/ML IV BOLUS
INTRAVENOUS | Status: DC | PRN
  Administered 2014-08-23: 180 mg via INTRAVENOUS

## 2014-08-23 MED ORDER — LIDOCAINE HCL (CARDIAC) 20 MG/ML IV SOLN
INTRAVENOUS | Status: DC | PRN
  Administered 2014-08-23: 50 mg via INTRAVENOUS

## 2014-08-23 SURGICAL SUPPLY — 12 items
BAG URO CATCHER STRL LF (DRAPE) ×2 IMPLANT
BASKET LASER NITINOL 1.9FR (BASKET) ×2 IMPLANT
CATH INTERMIT  6FR 70CM (CATHETERS) ×2 IMPLANT
DRAPE CAMERA CLOSED 9X96 (DRAPES) ×2 IMPLANT
GLOVE BIOGEL M STRL SZ7.5 (GLOVE) ×4 IMPLANT
GOWN STRL REUS W/TWL LRG LVL3 (GOWN DISPOSABLE) ×2 IMPLANT
GUIDEWIRE ANG ZIPWIRE 038X150 (WIRE) ×2 IMPLANT
GUIDEWIRE STR DUAL SENSOR (WIRE) ×2 IMPLANT
MANIFOLD NEPTUNE II (INSTRUMENTS) ×2 IMPLANT
PACK CYSTO (CUSTOM PROCEDURE TRAY) ×2 IMPLANT
TUBE FEEDING 8FR 16IN STR KANG (MISCELLANEOUS) ×2 IMPLANT
TUBING CONNECTING 10 (TUBING) ×2 IMPLANT

## 2014-08-23 NOTE — Progress Notes (Signed)
Dr. Kalman Shan made aware of patient's repeat CBG RESULTS BEING 239-O.K. To go to Short Stay

## 2014-08-23 NOTE — Brief Op Note (Signed)
08/23/2014  11:55 AM  PATIENT:  [name]  73 y.o. male  PRE-OPERATIVE DIAGNOSIS:  RIGHT URETERAL TUMOR, BLADDER CANCER  POST-OPERATIVE DIAGNOSIS:  right ureteral tumor, bladder cancer, Small Rt Renal Stone  PROCEDURE:  Procedure(s): CYSTOSCOPY/RETROGRADE/ DIAGNOSTIC URETEROSCOPY/RIGHT RENAL STONE EXTRACTION (Right)  SURGEON:  Surgeon(s) and Role:    * Alexis Frock, MD - Primary  PHYSICIAN ASSISTANT:   ASSISTANTS: none   ANESTHESIA:   general  EBL:     BLOOD ADMINISTERED:none  DRAINS: none   LOCAL MEDICATIONS USED:  NONE  SPECIMEN:  Source of Specimen:  Rt renal stone  DISPOSITION OF SPECIMEN:  [facility] for compositional analysis  COUNTS:  YES  TOURNIQUET:  * No tourniquets in log *  DICTATION: .Other Dictation: Dictation Number [id number]  PLAN OF CARE: Discharge to home after PACU  PATIENT DISPOSITION:  PACU - hemodynamically stable.   Delay start of Pharmacological VTE agent (>24hrs) due to surgical blood loss or risk of bleeding: no

## 2014-08-23 NOTE — Progress Notes (Signed)
Dr. Kalman Shan notified of patient's CBG results in PACU- 230- 6 units Novolog Insulin SUBCUTANEOUS given in right arm as ordered.

## 2014-08-23 NOTE — Anesthesia Postprocedure Evaluation (Signed)
  Anesthesia Post-op Note  Patient: [name]  Procedure(s) Performed: Procedure(s) (LRB): CYSTOSCOPY/RETROGRADE/ DIAGNOSTIC URETEROSCOPY/RIGHT RENAL STONE EXTRACTION (Right)  Patient Location: PACU  Anesthesia Type: General  Level of Consciousness: awake and alert   Airway and Oxygen Therapy: Patient Spontanous Breathing  Post-op Pain: mild  Post-op Assessment: Post-op Vital signs reviewed, Patient's Cardiovascular Status Stable, Respiratory Function Stable, Patent Airway and No signs of Nausea or vomiting  Last Vitals:  Filed Vitals:   08/23/14 1315  BP: 118/39  Pulse: 70  Temp:   Resp: 13    Post-op Vital Signs: stable   Complications: No apparent anesthesia complications

## 2014-08-23 NOTE — Transfer of Care (Signed)
Immediate Anesthesia Transfer of Care Note  Patient: [name]  Procedure(s) Performed: Procedure(s) (LRB): CYSTOSCOPY/RETROGRADE/ DIAGNOSTIC URETEROSCOPY/RIGHT RENAL STONE EXTRACTION (Right)  Patient Location: PACU  Anesthesia Type: General  Level of Consciousness: sedated, patient cooperative and responds to stimulation  Airway & Oxygen Therapy: Patient Spontanous Breathing and Patient connected to face mask oxgen  Post-op Assessment: Report given to PACU RN and Post -op Vital signs reviewed and stable  Post vital signs: Reviewed and stable  Complications: No apparent anesthesia complications

## 2014-08-23 NOTE — Progress Notes (Signed)
Dr. Tresa Moore in to talk with patient- made aware of patient's CBG RESULTS IN pacu- 230 and that 6 units of Novolog Insulin had been given.

## 2014-08-23 NOTE — Anesthesia Preprocedure Evaluation (Signed)
Anesthesia Evaluation  Patient identified by MRN, date of birth, ID band Patient awake    Reviewed: Allergy & Precautions, H&P , NPO status , Patient's Chart, lab work & pertinent test results  Airway Mallampati: III TM Distance: <3 FB Neck ROM: Full    Dental no notable dental hx.    Pulmonary sleep apnea , COPDformer smoker,  breath sounds clear to auscultation  + decreased breath sounds      Cardiovascular + CAD, + Peripheral Vascular Disease and +CHF + pacemaker Rhythm:Regular Rate:Normal  Complete heart block   cardioverted from A.Fib,hx. Sick sinus syndrome -Pacemaker implanted    - Left ventricle: Wall thickness was increased in a pattern of   severe LVH. The estimated ejection fraction was 55%. - Aortic valve: There was very mild stenosis. - Mitral valve: Mildly calcified annulus. There was mild   regurgitation. - Left atrium: The atrium was dilated. No evidence of thrombus in   the atrial cavity or appendage. - Right atrium: No evidence of thrombus in the atrial cavity or   appendage. - Atrial septum: No defect or patent foramen ovale was identified.   Echo contrast study showed no right-to-left atrial level shunt,   following an increase in RA pressure induced by provocative   maneuvers. - Tricuspid valve: No evidence of vegetation. - Pulmonic valve: No evidence of vegetation. - Impressions: No LAA thrombus Proceeded with succesful [place] under   propofol anesthesia.    Neuro/Psych negative neurological ROS  negative psych ROS   GI/Hepatic negative GI ROS, Neg liver ROS,   Endo/Other  diabetesHypothyroidism Morbid obesity  Renal/GU negative Renal ROS  negative genitourinary   Musculoskeletal negative musculoskeletal ROS (+)   Abdominal (+) + obese,   Peds negative pediatric ROS (+)  Hematology negative hematology ROS (+)   Anesthesia Other Findings   Reproductive/Obstetrics negative OB ROS                            Anesthesia Physical Anesthesia Plan  ASA: IV  Anesthesia Plan: General   Post-op Pain Management:    Induction: Intravenous  Airway Management Planned: Oral ETT and Video Laryngoscope Planned  Additional Equipment:   Intra-op Plan:   Post-operative Plan: Extubation in OR  Informed Consent: I have reviewed the patients History and Physical, chart, labs and discussed the procedure including the risks, benefits and alternatives for the proposed anesthesia with the patient or authorized representative who has indicated his/her understanding and acceptance.   Dental advisory given  Plan Discussed with: CRNA and Surgeon  Anesthesia Plan Comments:         Anesthesia Quick Evaluation

## 2014-08-23 NOTE — Discharge Instructions (Signed)
1 - You may have urinary urgency (bladder spasms) and bloody urine on / off x 1-2 weeks. This is normal. ° °2 - Call MD or go to ER for fever >102, severe pain / nausea / vomiting not relieved by medications, or acute change in medical status ° °

## 2014-08-23 NOTE — H&P (Signed)
[name] is an 73 y.o. male.    Chief Complaint: Pre-OP Right Diagnostic Ureteroscopy, Possible Ureteroscopic Tumor Ablation  HPI:     1 - Recurrent UTI - Pt wtih UTI about every other year, including 09/2013. UCX's e. coli and streptoccus. One pyelo episode in 2008. He is diabetic with significant glycosuria. PVR 2015 "78mL"  2 - Nephrolithiasis - Two prior episodes of colic managed medically around 1980 and 1990.  Imagign 2014 without sig stones.   3 - Prostate Screening - No FHX prostate cancer. PSA 2014 0.62 (at pt age 38), DRE 60gm smooth.  4 - Buried Penis - Pt voids seated as unable to find penis routienly 2/2 morbid obesity. No phimosis.  5 - Gross Hematuria - Pt with new gross hematuria 09/2013 prompting ER CT which revealed massive bladder / ureteral cancer.  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   10/2013 - TURBT + Rt stent - pTaG1 massive tumor with visible involvment Rt distal ureter which was partially resected intramurally.  CT with left ext. iliac node 1.5cm.  01/2014 - re-TURBT + Rt distal ureteral tumor ablation / stent exchange, path all benign 05/2014 surv cysto / stent pull no recurrence  PMH sig for morbid obesity, DM (A1C >8, with LE neuropathy), OSA, AFib/DVT/Coumadin, CHF, LLE arterial thrombosis / thrombectomy,   Today Korban is seen to proceed with cysto and diagnotic right ureteroscopy for h/o bladder and right ureteral cancer. He has been off blood thinners peri-op.   Past Medical History  Diagnosis Date  . Diabetes mellitus   . COPD (chronic obstructive pulmonary disease)     CPAP  . Renal insufficiency   . Hyperlipemia   . Hypothyroidism   . Hyperkalemia   . 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 Sick sinus syndrome     s/p PPM by JA  . Pancreatitis   . Morbid obesity   . OSA on CPAP     using CPAP although it is hard for him to tolerate  .  CAD (coronary artery disease)   . Hypertensive cardiovascular-renal disease   . Depression   . Anxiety   . H/O Legionnaire's disease 2003  . History of blood clots     R groin  . Peripheral neuropathy   . Osteoarthritis     fingers  . GERD (gastroesophageal reflux disease)   . Anemia     hx of  . Pneumonia 2003  . Cancer 2014    bladder cancer AND RIGHT URETERAL CANCER  . Diastolic dysfunction with chronic heart failure   . Persistent atrial fibrillation   . Atypical atrial flutter   . Venous insufficiency   . Complete heart block     cardioverted from A.Fib,hx. Sick sinus syndrome -Pacemaker implanted  . Pacemaker     implanted [employer] 7'11  . CHF (congestive heart failure)   . Gait difficulty     slow gait"ambulates with walker"    Past Surgical History  Procedure Laterality Date  . Nasal septum surgery  1967  . Pacemaker placement  06/2010    [facility] Accent RF DR, Model [id number] ( Serial number [id number])  . Thromboembolectomy and four compartment fasciotomy  2009    GROIN AREA  . Cataract extraction Right   . Cholecystectomy  2012  . Insert / replace / remove pacemaker  2011  .  Transurethral resection of bladder tumor with gyrus (turbt-gyrus) N/A 10/26/2013    Procedure: TRANSURETHRAL RESECTION OF BLADDER TUMOR WITH GYRUS (TURBT-GYRUS);  Surgeon: Alexis Frock, MD;  Location: [facility];  Service: Urology;  Laterality: N/A;  . Cystoscopy w/ ureteral stent placement Right 10/26/2013    Procedure: CYSTOSCOPY WITH RETROGRADE PYELOGRAM/URETERAL STENT PLACEMENT;  Surgeon: Alexis Frock, MD;  Location: [facility];  Service: Urology;  Laterality: Right;  . Embolectomy Left 11/02/2013    Procedure: LEFT FEMORAL EMBOLECTOMY, LEFT FEMORAL ARTERY ENDARTERECTOMY WITH DACRON PATCH ANGIOPLASTY.;  Surgeon: Mal Misty, MD;  Location: [facility];  Service: Vascular;  Laterality: Left;  . Transurethral resection of bladder tumor with gyrus (turbt-gyrus) N/A 01/11/2014    Procedure:  TRANSURETHRAL RESECTION OF BLADDER TUMOR WITH GYRUS (TURBT-GYRUS);  Surgeon: Alexis Frock, MD;  Location: [facility];  Service: Urology;  Laterality: N/A;  . Cystoscopy with ureteroscopy and stent placement Right 01/11/2014    Procedure: CYSTOSCOPY WITH URETEROSCOPY ,RIGHT RETROGRADE AND STENT CHANGE AND LASER OF URETERAL TUMOR;  Surgeon: Alexis Frock, MD;  Location: [facility];  Service: Urology;  Laterality: Right;  . Holmium laser application Right 12/14/735    Procedure: HOLMIUM LASER APPLICATION;  Surgeon: Alexis Frock, MD;  Location: [facility];  Service: Urology;  Laterality: Right;  . Tee without cardioversion N/A 05/16/2014    Procedure: TRANSESOPHAGEAL ECHOCARDIOGRAM (TEE);  Surgeon: Josue Hector, MD;  Location: [facility];  Service: Cardiovascular;  Laterality: N/A;  . Cardioversion N/A 05/16/2014    Procedure: CARDIOVERSION;  Surgeon: Josue Hector, MD;  Location: [facility]OSCOPY;  Service: Cardiovascular;  Laterality: N/A;  . Cardiac catheterization      11-02-13    Family History  Problem Relation Age of Onset  . Liver cancer Mother     deceased age 37  . Cancer Mother     liver cancer  . Colon cancer Neg Hx   . Heart attack Father    Social History:  reports that he quit smoking about 7 years ago. His smoking use included Cigarettes. He has a 100 pack-year smoking history. He has never used smokeless tobacco. He reports that he does not drink alcohol or use illicit drugs.  Allergies:  Allergies  Allergen Reactions  . Actos [Pioglitazone] Swelling    No prescriptions prior to admission    No results found for this or any previous visit (from the past 48 hour(s)). No results found.  Review of Systems  Constitutional: Negative for fever, chills and weight loss.  HENT: Negative.   Eyes: Negative.   Respiratory: Positive for wheezing.   Cardiovascular: Positive for leg swelling. Negative for chest pain.  Gastrointestinal: Negative.  Negative for nausea and vomiting.   Genitourinary: Negative.  Negative for flank pain.  Musculoskeletal: Negative.   Skin: Negative.   Neurological: Negative.   Endo/Heme/Allergies: Negative.   Psychiatric/Behavioral: Negative.     There were no vitals taken for this visit. Physical Exam  Constitutional: He appears well-developed.  Stigmata of chronic illness and morbid obesity, at baseline  HENT:  Head: Normocephalic and atraumatic.  Eyes: Pupils are equal, round, and reactive to light.  Neck: Normal range of motion.  Cardiovascular: Normal rate.   Respiratory: Effort normal. No respiratory distress.  GI: Soft.  Morbid obesity limit sensitivity of exam  Genitourinary:  No CVAT  Musculoskeletal: Normal range of motion. He exhibits edema.  Stable LE edema  Neurological: He is alert.  Skin: Skin is warm.  Psychiatric: He has a normal mood and affect. His  behavior is normal. Judgment and thought content normal.     Assessment/Plan    1 - Recurrent UTI - I again explained that his diabetes is likely his biggest risk factor for recurrent infections given sig glycosuria. No hydro or stones by CT.  He has been on Augmentin peri-op to decrease bacterial load.  2 - Nephrolithiasis - stone free by CT 2014.  3 - Prostate Screening - up to date this year. At age 20 and with his level of comorbidity I would NOT recomend future screening.  4 - Buried Penis - not interfearing wtih voiding, reinforced need for good hygiene, which he is maintianing.   5 - Gross Hematuria - Eval wtih cysto, CT reveals bladder cancer as per below.  6 - Bladder Cancer  and Distal Ureteral Cancer - Fortunately pathology all low-grade, and last TURBT suggests complete resection. This is great news especially given his comorbidity.   Will plan for operative cysto / right diagnostic ureteroscopy today. Risks including bleeding, infection, damage to kidney / ureter / bladder non-cure as well as anaesthetic risks which are amplified by his  significant cardiopulmonary disease reinforced.   Jazmynn Pho 08/23/2014, 5:58 AM

## 2014-08-24 ENCOUNTER — Encounter (HOSPITAL_COMMUNITY): Payer: Self-pay | Admitting: Urology

## 2014-08-24 NOTE — Op Note (Signed)
[name], [name]:  [id number]  MEDICAL RECORD NO.:  [id number]  LOCATION:                                 FACILITY:  PHYSICIAN:  Alexis Frock, MD     DATE OF BIRTH:  [birth year]  DATE OF PROCEDURE:  08/23/2014 DATE OF DISCHARGE:  08/23/2014                              OPERATIVE REPORT   DIAGNOSIS:  History of bladder cancer and right ureteral cancer.  PROCEDURES: 1. Cystoscopy, bilateral retrograde pyelograms with interpretation. 2. Right ureteroscopy with basketing of stone.  FINDINGS: 1. Unremarkable urinary bladder, old resection site, closed right     ureteral orifice without recurrent tumor. 2. Unremarkable bilateral retrograde pyelograms. 3. Right ureteroscopy without recurrent ureteral tumor, small free-     floating right intrarenal stone approximately 2 mm, this was     basketed and removed in its entirety.  INDICATION:  [name] is a 73 year old gentleman with history of significant comorbidity including morbid obesity, diabetes, congestive heart failure, sleep apnea.  He was found to have a very large volume of low-grade bladder cancer and right distal ureteral tumor last year.  He underwent resection of that at that time including ablation of his right ureteral tumor with prolonged stenting which was subsequently removed. He has been on a surveillance protocol of alternating office cystoscopy, and operative cystoscopy with right ureteroscopy and he is due for this. Informed consent was obtained and placed in medical record.  PROCEDURE IN DETAIL:  The patient being, [name], was verified. Procedure being cystoscopy, bilateral retrograde pyelograms, and diagnostic ureteroscopy was confirmed.  Procedure was carried out.  Time- out was performed.  Intravenous antibiotics were administered.  General endotracheal anesthesia was induced.  Patient was placed into a low lithotomy position.  Sterile field was created by prepping and  draping the patient's penis, perineum, and proximal thighs using iodine x3. Cystourethroscopy was performed using a 22-French rigid cystoscope, 12- degree offset lens.  Inspection of the anterior and posterior urethra was unremarkable.  Inspection of urinary bladder revealed no diverticula, calcifications, papular lesions.  An old resection site was clearly visualized just medial to the right ureteral orifice.  There was no obvious recurrent tumor.  The right ureteral orifice had stigmata of prior resection and it was widely open and patent without papillary tissue visible at the orifice.  Attention was then directed to bilateral retrograde pyelography.  First on the left side, the left ureteral orifice was cannulated with 6-French end-hole catheter and left retrograde pyelogram was obtained.  Left retrograde pyelogram demonstrated a single left ureter with single system left kidney.  No filling defects or narrowing noted.  Similar right retrograde pyelogram was obtained.  Right retrograde pyelogram demonstrated a single right ureter, single system right kidney.  No filling defects or narrowing noted.  A 0.038 Glidewire was advanced at the level of the upper pole and set aside as a safety wire.  Next, semi-rigid ureteroscopy was performed at the distal two-thirds of the right ureter alongside a separate Sensor working wire. No mucosal abnormalities.  Calcifications were seen.  Specifically, there was no evidence of current distal ureteral tumor as the right kidney and  proximal ureter were not visualized with this technique.  The semi-rigid ureteroscope was exchanged for the 8-French flexible digital ureteroscope which was placed under fluoroscopic guidance to level of the upper pole of the kidney over the Sensor working wire.  An 8-French feeding tube was placed in urinary bladder for pressure release. Systematic inspection of each calyx x2 revealed no evidence of papillary tumor,  whatsoever.  There was a small free-floating renal stone in the upper pole, approximately 2 mm, this  appeared to be amenable to simple basketing as such, an escape basket was used to grasp the stone.  It was removed in its entirety.  As no access sheath was warranted or active ureteral dilation, now it was felt that ureteral stenting was not warranted.  As such, the bladder was emptied per cystoscope.  All wires were removed and procedure was terminated.  The patient tolerated procedure well.  There were no immediate periprocedural complications. The patient was taken to postanesthesia care unit in stable condition.          ______________________________ Alexis Frock, MD     TM/MEDQ  D:  08/23/2014  T:  08/23/2014  Job:  [id number]

## 2014-08-29 ENCOUNTER — Other Ambulatory Visit: Payer: Self-pay | Admitting: Endocrinology

## 2014-08-30 ENCOUNTER — Inpatient Hospital Stay (HOSPITAL_COMMUNITY): Admission: RE | Admit: 2014-08-30 | Payer: Commercial Managed Care - HMO | Source: Ambulatory Visit

## 2014-09-13 ENCOUNTER — Encounter (HOSPITAL_COMMUNITY): Payer: Commercial Managed Care - HMO

## 2014-09-25 ENCOUNTER — Encounter: Payer: Commercial Managed Care - HMO | Admitting: Internal Medicine

## 2014-09-27 ENCOUNTER — Ambulatory Visit (INDEPENDENT_AMBULATORY_CARE_PROVIDER_SITE_OTHER): Payer: Commercial Managed Care - HMO | Admitting: *Deleted

## 2014-09-27 ENCOUNTER — Encounter: Payer: Self-pay | Admitting: Internal Medicine

## 2014-09-27 DIAGNOSIS — I495 Sick sinus syndrome: Secondary | ICD-10-CM

## 2014-09-27 NOTE — Progress Notes (Signed)
Remote pacemaker transmission.   

## 2014-09-29 LAB — MDC_IDC_ENUM_SESS_TYPE_REMOTE
Brady Statistic AP VS Percent: 87 %
Brady Statistic AS VP Percent: 1 %
Brady Statistic RA Percent Paced: 97 %
Brady Statistic RV Percent Paced: 13 %
Implantable Pulse Generator Model: 2210
Implantable Pulse Generator Serial Number: 7152830
Lead Channel Impedance Value: 380 Ohm
Lead Channel Pacing Threshold Amplitude: 0.75 V
Lead Channel Pacing Threshold Pulse Width: 0.5 ms
Lead Channel Pacing Threshold Pulse Width: 0.5 ms
Lead Channel Sensing Intrinsic Amplitude: 7.3 mV
Lead Channel Setting Sensing Sensitivity: 2 mV
MDC IDC MSMT BATTERY REMAINING LONGEVITY: 68 mo
MDC IDC MSMT BATTERY REMAINING PERCENTAGE: 67 %
MDC IDC MSMT BATTERY VOLTAGE: 2.93 V
MDC IDC MSMT LEADCHNL RA IMPEDANCE VALUE: 410 Ohm
MDC IDC MSMT LEADCHNL RA SENSING INTR AMPL: 3.4 mV
MDC IDC MSMT LEADCHNL RV PACING THRESHOLD AMPLITUDE: 0.75 V
MDC IDC SESS DTM: 20151021073903
MDC IDC SET LEADCHNL RA PACING AMPLITUDE: 2 V
MDC IDC SET LEADCHNL RV PACING AMPLITUDE: 2.5 V
MDC IDC SET LEADCHNL RV PACING PULSEWIDTH: 0.5 ms
MDC IDC STAT BRADY AP VP PERCENT: 13 %
MDC IDC STAT BRADY AS VS PERCENT: 1 %

## 2014-10-11 ENCOUNTER — Encounter: Payer: Self-pay | Admitting: Cardiology

## 2014-11-06 ENCOUNTER — Other Ambulatory Visit: Payer: Self-pay | Admitting: Nurse Practitioner

## 2014-11-06 ENCOUNTER — Encounter: Payer: Self-pay | Admitting: Nurse Practitioner

## 2014-11-06 ENCOUNTER — Telehealth: Payer: Self-pay | Admitting: Nurse Practitioner

## 2014-11-06 ENCOUNTER — Ambulatory Visit (INDEPENDENT_AMBULATORY_CARE_PROVIDER_SITE_OTHER): Payer: Commercial Managed Care - HMO | Admitting: Nurse Practitioner

## 2014-11-06 VITALS — BP 108/94 | HR 70 | Ht 70.0 in | Wt 313.4 lb

## 2014-11-06 DIAGNOSIS — I5032 Chronic diastolic (congestive) heart failure: Secondary | ICD-10-CM

## 2014-11-06 DIAGNOSIS — I480 Paroxysmal atrial fibrillation: Secondary | ICD-10-CM

## 2014-11-06 LAB — BASIC METABOLIC PANEL
BUN: 34 mg/dL — ABNORMAL HIGH (ref 6–23)
CO2: 23 mEq/L (ref 19–32)
Calcium: 9.3 mg/dL (ref 8.4–10.5)
Chloride: 97 mEq/L (ref 96–112)
Creatinine, Ser: 1.7 mg/dL — ABNORMAL HIGH (ref 0.4–1.5)
GFR: 43.85 mL/min — ABNORMAL LOW (ref >60.00)
Glucose, Bld: 273 mg/dL — ABNORMAL HIGH (ref 70–99)
Potassium: 3.8 mEq/L (ref 3.5–5.1)
Sodium: 135 mEq/L (ref 135–145)

## 2014-11-06 LAB — CBC
HCT: 42.2 % (ref 39.0–52.0)
Hemoglobin: 13.8 g/dL (ref 13.0–17.0)
MCHC: 32.7 g/dL (ref 30.0–36.0)
MCV: 93.2 fl (ref 78.0–100.0)
Platelets: 240 10*3/uL (ref 150.0–400.0)
RBC: 4.53 Mil/uL (ref 4.22–5.81)
RDW: 15.6 % — ABNORMAL HIGH (ref 11.5–15.5)
WBC: 12 10*3/uL — ABNORMAL HIGH (ref 4.0–10.5)

## 2014-11-06 LAB — PROTIME-INR
INR: 1.2 ratio — ABNORMAL HIGH (ref 0.8–1.0)
Prothrombin Time: 13.3 s — ABNORMAL HIGH (ref 9.6–13.1)

## 2014-11-06 LAB — APTT: aPTT: 33.5 s — ABNORMAL HIGH (ref 23.4–32.7)

## 2014-11-06 MED ORDER — METOPROLOL TARTRATE 25 MG PO TABS: 25.0000 mg | ORAL_TABLET | Freq: Three times a day (TID) | ORAL | Status: DC

## 2014-11-06 NOTE — Progress Notes (Signed)
[name] Date of Birth: [birth year] Medical Record #[id number]  History of Present Illness: [name] is seen back today for a work in visit - seen for Dr. Rayann Heman. He is a 73 year old male with diastolic HF, morbid obesity, PAF with past cardioversion, venous insufficiency, CKD, anemia, hypothyroidism, DM, COPD, OSA on CPAP, HLD, SSS with PPM in place, CAD (no specifics noted), HTN and depression.   Seen here at the end of July of 2015 - had been admitted earlier in the summer with diastolic heart failure and was cardioverted. Remained in sinus at that visit. Was switched to Eliquis from Coumadin. Florinef and Lasix were cut back - felt to be with volume depletion and looked "dry", PPM check was ok. Support stockings recommended as well as rehab. Most of his issues center around his obesity - he has significant trouble with just minimal walking.   I saw him back in October - he was "weak" but cardiac status seemed ok. Was getting ready to have a cystoscopy by GU.   Called earlier this morning with rapid pulse - extra dose of Lopressor advised and added to my schedule.   Comes in today. Here alone. He does not feel well.Has not felt well for about a week.  He is having palpitations. Feels his heart beating fast at times. Weak. May be a bit more short of breath. No extra swelling. Morbidly obese. He remains on his Eliquis and has not missed any doses.   Current Outpatient Prescriptions  Medication Sig Dispense Refill  . acetaminophen (TYLENOL) 500 MG tablet Take 500 mg by mouth 2 (two) times daily as needed for moderate pain (pain).     Marland Kitchen albuterol (PROAIR HFA) 108 (90 BASE) MCG/ACT inhaler Inhale 2 puffs into the lungs every 6 (six) hours as needed for wheezing or shortness of breath.    . ALPRAZolam (XANAX) 0.25 MG tablet Take 0.25 mg by mouth 3 (three) times daily as needed for anxiety.    Marland Kitchen apixaban (ELIQUIS) 5 MG TABS tablet Take 1 tablet (5 mg total) by mouth 2 (two) times daily. 180  tablet 3  . Ascorbic Acid (VITAMIN C) 500 MG tablet Take 500 mg by mouth daily.     . brimonidine (ALPHAGAN P) 0.1 % SOLN Place 1 drop into the left eye 3 (three) times daily.     . calcium carbonate (TUMS - DOSED IN MG ELEMENTAL CALCIUM) 500 MG chewable tablet Chew 2 tablets by mouth 2 (two) times daily as needed for indigestion or heartburn.    . cholecalciferol (VITAMIN D) 1000 UNITS tablet Take 1,000 Units by mouth daily.    . CRESTOR 40 MG tablet TAKE 1/2 TABLET EVERY EVENING 45 tablet 2  . dextromethorphan (DELSYM) 30 MG/5ML liquid Take 90 mg by mouth 2 (two) times daily as needed for cough.    . dorzolamide-timolol (COSOPT) 22.3-6.8 MG/ML ophthalmic solution Place 1 drop into both eyes 2 (two) times daily.     . fluticasone (FLONASE) 50 MCG/ACT nasal spray Place 2 sprays into both nostrils daily as needed for allergies or rhinitis.    . furosemide (LASIX) 40 MG tablet Take one tab (40mg ) in the AM, and 1/2 tab (20mg ) in the PM 135 tablet 3  . insulin regular human CONCENTRATED (HUMULIN R) 500 UNIT/ML SOLN injection Inject 250 units 4 times a day. 90 vial 1  . Lactobacillus (ACIDOPHILUS) CAPS capsule Take 1 capsule by mouth daily.    Marland Kitchen latanoprost (XALATAN) 0.005 %  ophthalmic solution Place 1 drop into both eyes at bedtime.    Marland Kitchen levothyroxine (SYNTHROID, LEVOTHROID) 150 MCG tablet Take 50 mcg by mouth every morning.     . metoprolol tartrate (LOPRESSOR) 25 MG tablet Take 1 tablet (25 mg total) by mouth 2 (two) times daily. 180 tablet 1  . Multiple Vitamin (MULTIVITAMIN WITH MINERALS) TABS tablet Take 1 tablet by mouth daily.    . Omega-3 Fatty Acids (FISH OIL) 1000 MG CAPS Take 1,000 mg by mouth every morning.     Marland Kitchen omeprazole (PRILOSEC) 20 MG capsule Take 20 mg by mouth every morning.    . senna-docusate (SENOKOT-S) 8.6-50 MG per tablet Take 1 tablet by mouth 2 (two) times daily. While taking pain meds to prevent constipation. 20 tablet 0  . traMADol (ULTRAM) 50 MG tablet Take 2 tablets  (100 mg total) by mouth 2 (two) times daily as needed (pain). 50 tablet 3  . traMADol (ULTRAM) 50 MG tablet Take 1-2 tablets (50-100 mg total) by mouth every 6 (six) hours as needed for moderate pain. Post-operatively 20 tablet 0  . venlafaxine XR (EFFEXOR-XR) 150 MG 24 hr capsule Take 150 mg by mouth daily as needed (anxiety.).     No current facility-administered medications for this visit.    Allergies  Allergen Reactions  . Actos [Pioglitazone] Swelling    Past Medical History  Diagnosis Date  . Diabetes mellitus   . COPD (chronic obstructive pulmonary disease)     CPAP  . Renal insufficiency   . Hyperlipemia   . Hypothyroidism   . Hyperkalemia   . 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 Sick sinus syndrome     s/p PPM by JA  . Pancreatitis   . Morbid obesity   . OSA on CPAP     using CPAP although it is hard for him to tolerate  . CAD (coronary artery disease)   . Hypertensive cardiovascular-renal disease   . Depression   . Anxiety   . H/O Legionnaire's disease 2003  . History of blood clots     R groin  . Peripheral neuropathy   . Osteoarthritis     fingers  . GERD (gastroesophageal reflux disease)   . Anemia     hx of  . Pneumonia 2003  . Cancer 2014    bladder cancer AND RIGHT URETERAL CANCER  . Diastolic dysfunction with chronic heart failure   . Persistent atrial fibrillation   . Atypical atrial flutter   . Venous insufficiency   . Complete heart block     cardioverted from A.Fib,hx. Sick sinus syndrome -Pacemaker implanted  . Pacemaker     implanted [employer] 7'11  . CHF (congestive heart failure)   . Gait difficulty     slow gait"ambulates with walker"    Past Surgical History  Procedure Laterality Date  . Nasal septum surgery  1967  . Pacemaker placement  06/2010    [facility] Accent RF DR, Model [id number] ( Serial number [id number])  . Thromboembolectomy and four compartment fasciotomy  2009    GROIN AREA  . Cataract extraction  Right   . Cholecystectomy  2012  . Insert / replace / remove pacemaker  2011  . Transurethral resection of bladder tumor with gyrus (turbt-gyrus) N/A 10/26/2013    Procedure: TRANSURETHRAL RESECTION OF BLADDER TUMOR WITH GYRUS (TURBT-GYRUS);  Surgeon: Alexis Frock, MD;  Location: [facility];  Service: Urology;  Laterality: N/A;  .  Cystoscopy w/ ureteral stent placement Right 10/26/2013    Procedure: CYSTOSCOPY WITH RETROGRADE PYELOGRAM/URETERAL STENT PLACEMENT;  Surgeon: Alexis Frock, MD;  Location: [facility];  Service: Urology;  Laterality: Right;  . Embolectomy Left 11/02/2013    Procedure: LEFT FEMORAL EMBOLECTOMY, LEFT FEMORAL ARTERY ENDARTERECTOMY WITH DACRON PATCH ANGIOPLASTY.;  Surgeon: Mal Misty, MD;  Location: [facility];  Service: Vascular;  Laterality: Left;  . Transurethral resection of bladder tumor with gyrus (turbt-gyrus) N/A 01/11/2014    Procedure: TRANSURETHRAL RESECTION OF BLADDER TUMOR WITH GYRUS (TURBT-GYRUS);  Surgeon: Alexis Frock, MD;  Location: [facility];  Service: Urology;  Laterality: N/A;  . Cystoscopy with ureteroscopy and stent placement Right 01/11/2014    Procedure: CYSTOSCOPY WITH URETEROSCOPY ,RIGHT RETROGRADE AND STENT CHANGE AND LASER OF URETERAL TUMOR;  Surgeon: Alexis Frock, MD;  Location: [facility];  Service: Urology;  Laterality: Right;  . Holmium laser application Right 01/12/9562    Procedure: HOLMIUM LASER APPLICATION;  Surgeon: Alexis Frock, MD;  Location: [facility];  Service: Urology;  Laterality: Right;  . Tee without cardioversion N/A 05/16/2014    Procedure: TRANSESOPHAGEAL ECHOCARDIOGRAM (TEE);  Surgeon: Josue Hector, MD;  [facility];  Service: Cardiovascular;  Laterality: N/A;  . Cardioversion N/A 05/16/2014    Procedure: CARDIOVERSION;  Surgeon: Josue Hector, MD;  Location: [facility] ENDOSCOPY;  Service: Cardiovascular;  Laterality: N/A;  . Cardiac catheterization      11-02-13  . Cystoscopy/retrograde/ureteroscopy Right 08/23/2014    Procedure:  CYSTOSCOPY/RETROGRADE/ DIAGNOSTIC URETEROSCOPY/RIGHT RENAL STONE EXTRACTION;  Surgeon: Alexis Frock, MD;  Location: [facility];  Service: Urology;  Laterality: Right;    History  Smoking status  . Former Smoker -- 2.00 packs/day for 50 years  . Types: Cigarettes  . Quit date: 12/08/2006  Smokeless tobacco  . Never Used    History  Alcohol Use No    Comment: quit 3 years ago    Family History  Problem Relation Age of Onset  . Liver cancer Mother     deceased age 16  . Cancer Mother     liver cancer  . Colon cancer Neg Hx   . Heart attack Father     Review of Systems: The review of systems is per the HPI.  All other systems were reviewed and are negative.  Physical Exam: BP 108/94 mmHg  Pulse 70  Ht 5\' 10"  (1.778 m)  Wt 313 lb 6.4 oz (142.157 kg)  BMI 44.97 kg/m2 Patient is very pleasant and in no acute distress. He is obese.  He is chronically ill appearing. Skin is warm and dry. Color is normal.  HEENT is unremarkable. Normocephalic/atraumatic. PERRL. Sclera are nonicteric. Neck is supple. No masses. No JVD. Lungs are clear. Cardiac exam shows a fairly regular rate and rhythm but presumed paced. Abdomen is obese but soft. Extremities are with edema. Gait and ROM are intact but he is using a walker and moving slow. No gross neurologic deficits noted.  Wt Readings from Last 3 Encounters:  11/06/14 313 lb 6.4 oz (142.157 kg)  08/23/14 310 lb (140.615 kg)  08/21/14 310 lb 6.4 oz (140.797 kg)    LABORATORY DATA/PROCEDURES: EKG shows paced rhythm with underlying AF.  Device check shows AF since 10/31/2014. No real high VR noted.    Lab Results  Component Value Date   WBC 10.3 08/17/2014   HGB 13.0 08/17/2014   HCT 39.6 08/17/2014   PLT 236 08/17/2014   GLUCOSE 214* 08/17/2014   CHOL 123 08/09/2014   TRIG *  08/09/2014    470.0 Triglyceride is over 400; calculations on Lipids are invalid.   HDL 25.80* 08/09/2014   LDLDIRECT 58.9 08/09/2014   LDLCALC  05/07/2008     UNABLE TO CALCULATE IF TRIGLYCERIDE OVER 400 mg/dL        Total Cholesterol/HDL:CHD Risk Coronary Heart Disease Risk Table                     Men   Women  1/2 Average Risk   3.4   3.3   ALT 51 08/09/2014   AST 58* 08/09/2014   NA 140 08/17/2014   K 4.1 08/17/2014   CL 99 08/17/2014   CREATININE 1.43* 08/17/2014   BUN 34* 08/17/2014   CO2 22 08/17/2014   TSH 1.20 08/09/2014   PSA 0.61 08/09/2014   INR 2.8 06/28/2014   HGBA1C 7.9* 08/09/2014   MICROALBUR 170.0 Repeated and verified X2.* 06/27/2013    BNP (last 3 results)  Recent Labs  05/13/14 1338 05/17/14 0540 06/07/14 1402  PROBNP 2142.0* 590.6* 77.0    Echo Study Conclusions from June 2015  - Left ventricle: Wall thickness was increased in a pattern of severe LVH. The estimated ejection fraction was 55%. - Aortic valve: There was very mild stenosis. - Mitral valve: Mildly calcified annulus. There was mild regurgitation. - Left atrium: The atrium was dilated. No evidence of thrombus in the atrial cavity or appendage. - Right atrium: No evidence of thrombus in the atrial cavity or appendage. - Atrial septum: No defect or patent foramen ovale was identified. Echo contrast study showed no right-to-left atrial level shunt, following an increase in RA pressure induced by provocative maneuvers. - Tricuspid valve: No evidence of vegetation. - Pulmonic valve: No evidence of vegetation. - Impressions: No LAA thrombus Proceeded with succesful [place] under propofol anesthesia.  Impressions:  - No LAA thrombus Proceeded with succesful [facility] under propofol anesthesia.    Assessment / Plan: 1. PAF - prior cardioversion back in June of 2015 - has been back in AF for 6 days - symptomatic. Will increase Lopressor to 25 mg TID. Arrange cardioversion for symptom relief. Arrange OV with Dr. Rayann Heman to discuss drug therapy. May not be much to offer. I will see as needed.   2. Chronic diastolic HF -weight stable. Will need to watch  closely.   3. Morbid obesity  His overall situation is quite tenuous at best.   Patient is agreeable to this plan and will call if any problems develop in the interim.   Burtis Junes, RN, [place] [address] [place],   [address] [phone]

## 2014-11-06 NOTE — Patient Instructions (Addendum)
We will be checking the following labs today - BMET, CBC, PT, PTT  Stay on your current medicines but increase your metoprolol 3 times a day  Needs follow up OV with Dr. Rayann Heman to discuss drug therapy for his AF - SOON  Call the [place] office at [phone] if you have any questions, problems or concerns.   Your provider has recommended a cardioversion.   You are scheduled for a cardioversion on Wednesday, December 2nd at 12 noon  with Dr. Meda Coffee or associates. Please go to [facility]nd [place] Stay on Wednesday, December 2nd at 10:30AM Enter through the [place] not have any food or drink after midnight on Tuesday You may take your medicines with a sip of water on the day of your procedure.   No insulin Wednesday morning.   You will need someone to drive you home following your procedure.   Call the [place] office at [phone] if you have any questions, problems or concerns.     Electrical Cardioversion Electrical cardioversion is the delivery of a jolt of electricity to change the rhythm of the heart. Sticky patches or metal paddles are placed on the chest to deliver the electricity from a device. This is done to restore a normal rhythm. A rhythm that is too fast or not regular keeps the heart from pumping well. Electrical cardioversion is done in an emergency if:   There is low or no blood pressure as a result of the heart rhythm.   Normal rhythm must be restored as fast as possible to protect the brain and heart from further damage.   It may save a life. Cardioversion may be done for heart rhythms that are not immediately life threatening, such as atrial fibrillation or flutter, in which:   The heart is beating too fast or is not regular.   Medicine to change the rhythm has not worked.   It is safe to wait in order to allow time for preparation.  Symptoms of the abnormal rhythm  are bothersome.  The risk of stroke and other serious problems can be reduced.  LET [facility] CARE PROVIDER KNOW ABOUT:   Any allergies you have.  All medicines you are taking, including vitamins, herbs, eye drops, creams, and over-the-counter medicines.  Previous problems you or members of your family have had with the use of anesthetics.   Any blood disorders you have.   Previous surgeries you have had.   Medical conditions you have.   RISKS AND COMPLICATIONS  Generally, this is a safe procedure. However, problems can occur and include:   Breathing problems related to the anesthetic used.  A blood clot that breaks free and travels to other parts of your body. This could cause a stroke or other problems. The risk of this is lowered by use of blood-thinning medicine (anticoagulant) prior to the procedure.  Cardiac arrest (rare).   BEFORE THE PROCEDURE   You may have tests to detect blood clots in your heart and to evaluate heart function.  You may start taking anticoagulants so your blood does not clot as easily.   Medicines may be given to help stabilize your heart rate and rhythm.   PROCEDURE  You will be given medicine through an IV tube to reduce discomfort and make you sleepy (sedative).   An electrical shock will be delivered.   AFTER THE PROCEDURE Your heart rhythm will be watched  to make sure it does not change. You will need someone to drive you home.

## 2014-11-06 NOTE — Telephone Encounter (Signed)
New Msg   Patient states he's having shortness of breath and a very rapid heartbeat.

## 2014-11-06 NOTE — Telephone Encounter (Signed)
Patient calls back around 1:00 pm. Does not feel any better after extra dose of Metoprolol, per Cecille Rubin, she will see him, today at 2:00 pm. Patient informed.

## 2014-11-06 NOTE — Telephone Encounter (Signed)
Spoke with patient who has been having an irregular heart rate for approx 3 days now. Having some SOB when he is up and moving. A little lightheadedness too. BP 159/64, p 64/on a machine. per Tommas Olp NP, advise him to take an extra Metoprol 25 mg now and call us back in a few hours. Thinks he may need another cardioversion.

## 2014-11-08 ENCOUNTER — Encounter (HOSPITAL_COMMUNITY): Payer: Self-pay | Admitting: *Deleted

## 2014-11-08 ENCOUNTER — Ambulatory Visit (HOSPITAL_COMMUNITY)
Admission: RE | Admit: 2014-11-08 | Discharge: 2014-11-08 | Disposition: A | Discharge status: Home / Self Care | Payer: Medicare HMO | Source: Ambulatory Visit | Attending: Cardiology | Admitting: Cardiology

## 2014-11-08 ENCOUNTER — Ambulatory Visit (HOSPITAL_COMMUNITY): Payer: Medicare HMO | Admitting: Anesthesiology

## 2014-11-08 ENCOUNTER — Ambulatory Visit: Payer: Commercial Managed Care - HMO | Admitting: Endocrinology

## 2014-11-08 ENCOUNTER — Encounter (HOSPITAL_COMMUNITY)
Admission: RE | Disposition: A | Discharge status: Home / Self Care | Payer: Self-pay | Source: Ambulatory Visit | Attending: Cardiology

## 2014-11-08 DIAGNOSIS — I129 Hypertensive chronic kidney disease with stage 1 through stage 4 chronic kidney disease, or unspecified chronic kidney disease: Secondary | ICD-10-CM | POA: Diagnosis not present

## 2014-11-08 DIAGNOSIS — G4733 Obstructive sleep apnea (adult) (pediatric): Secondary | ICD-10-CM | POA: Insufficient documentation

## 2014-11-08 DIAGNOSIS — J449 Chronic obstructive pulmonary disease, unspecified: Secondary | ICD-10-CM | POA: Diagnosis not present

## 2014-11-08 DIAGNOSIS — D649 Anemia, unspecified: Secondary | ICD-10-CM | POA: Diagnosis not present

## 2014-11-08 DIAGNOSIS — Z87891 Personal history of nicotine dependence: Secondary | ICD-10-CM | POA: Insufficient documentation

## 2014-11-08 DIAGNOSIS — E785 Hyperlipidemia, unspecified: Secondary | ICD-10-CM | POA: Insufficient documentation

## 2014-11-08 DIAGNOSIS — N189 Chronic kidney disease, unspecified: Secondary | ICD-10-CM | POA: Diagnosis not present

## 2014-11-08 DIAGNOSIS — I2510 Atherosclerotic heart disease of native coronary artery without angina pectoris: Secondary | ICD-10-CM | POA: Insufficient documentation

## 2014-11-08 DIAGNOSIS — K219 Gastro-esophageal reflux disease without esophagitis: Secondary | ICD-10-CM | POA: Insufficient documentation

## 2014-11-08 DIAGNOSIS — I5032 Chronic diastolic (congestive) heart failure: Secondary | ICD-10-CM | POA: Insufficient documentation

## 2014-11-08 DIAGNOSIS — F419 Anxiety disorder, unspecified: Secondary | ICD-10-CM | POA: Insufficient documentation

## 2014-11-08 DIAGNOSIS — Z79899 Other long term (current) drug therapy: Secondary | ICD-10-CM | POA: Insufficient documentation

## 2014-11-08 DIAGNOSIS — E119 Type 2 diabetes mellitus without complications: Secondary | ICD-10-CM | POA: Insufficient documentation

## 2014-11-08 DIAGNOSIS — Z888 Allergy status to other drugs, medicaments and biological substances status: Secondary | ICD-10-CM | POA: Diagnosis not present

## 2014-11-08 DIAGNOSIS — E875 Hyperkalemia: Secondary | ICD-10-CM | POA: Insufficient documentation

## 2014-11-08 DIAGNOSIS — Z794 Long term (current) use of insulin: Secondary | ICD-10-CM | POA: Diagnosis not present

## 2014-11-08 DIAGNOSIS — F329 Major depressive disorder, single episode, unspecified: Secondary | ICD-10-CM | POA: Insufficient documentation

## 2014-11-08 DIAGNOSIS — E039 Hypothyroidism, unspecified: Secondary | ICD-10-CM | POA: Insufficient documentation

## 2014-11-08 DIAGNOSIS — H409 Unspecified glaucoma: Secondary | ICD-10-CM | POA: Insufficient documentation

## 2014-11-08 DIAGNOSIS — I4819 Other persistent atrial fibrillation: Secondary | ICD-10-CM

## 2014-11-08 DIAGNOSIS — I872 Venous insufficiency (chronic) (peripheral): Secondary | ICD-10-CM | POA: Insufficient documentation

## 2014-11-08 DIAGNOSIS — I480 Paroxysmal atrial fibrillation: Secondary | ICD-10-CM

## 2014-11-08 DIAGNOSIS — Z950 Presence of cardiac pacemaker: Secondary | ICD-10-CM | POA: Insufficient documentation

## 2014-11-08 DIAGNOSIS — I4891 Unspecified atrial fibrillation: Secondary | ICD-10-CM

## 2014-11-08 DIAGNOSIS — G629 Polyneuropathy, unspecified: Secondary | ICD-10-CM | POA: Diagnosis not present

## 2014-11-08 HISTORY — PX: CARDIOVERSION: SHX1299

## 2014-11-08 SURGERY — CARDIOVERSION
Anesthesia: General

## 2014-11-08 MED ORDER — SODIUM CHLORIDE 0.9 % IV SOLN
INTRAVENOUS | Status: DC
  Administered 2014-11-08: 12:00:00 via INTRAVENOUS

## 2014-11-08 NOTE — Anesthesia Preprocedure Evaluation (Addendum)
Anesthesia Evaluation  Patient identified by MRN, date of birth, ID band Patient awake    Reviewed: Allergy & Precautions, H&P , NPO status , Patient's Chart, lab work & pertinent test results  Airway Mallampati: II  TM Distance: >3 FB Neck ROM: Full    Dental   Pulmonary sleep apnea , COPDformer smoker,          Cardiovascular + CAD + dysrhythmias Atrial Fibrillation + pacemaker     Neuro/Psych    GI/Hepatic GERD-  Medicated and Controlled,  Endo/Other  diabetes  Renal/GU CRFRenal disease     Musculoskeletal   Abdominal   Peds  Hematology   Anesthesia Other Findings   Reproductive/Obstetrics                            Anesthesia Physical Anesthesia Plan  ASA: III  Anesthesia Plan: General   Post-op Pain Management:    Induction: Intravenous  Airway Management Planned: Mask  Additional Equipment:   Intra-op Plan:   Post-operative Plan:   Informed Consent: I have reviewed the patients History and Physical, chart, labs and discussed the procedure including the risks, benefits and alternatives for the proposed anesthesia with the patient or authorized representative who has indicated his/her understanding and acceptance.     Plan Discussed with: CRNA and Surgeon  Anesthesia Plan Comments:        Anesthesia Quick Evaluation

## 2014-11-08 NOTE — Transfer of Care (Signed)
Immediate Anesthesia Transfer of Care Note  Patient: [name]  Procedure(s) Performed: Procedure(s): CARDIOVERSION (N/A)  Patient Location: PACU  Anesthesia Type:General  Level of Consciousness: awake, alert , oriented and sedated  Airway & Oxygen Therapy: Patient Spontanous Breathing and Patient connected to nasal cannula oxygen  Post-op Assessment: Report given to PACU RN, Post -op Vital signs reviewed and stable and Patient moving all extremities  Post vital signs: Reviewed and stable  Complications: No apparent anesthesia complications

## 2014-11-08 NOTE — Discharge Instructions (Signed)

## 2014-11-08 NOTE — Op Note (Signed)
Cardioversion  Patient anesthetized by anesthesia with Propol With pads in AP position patient cardioverted to SR with 200 J synchronized biphasic energy To SR   Procedure without complication Pacer interrogation done.

## 2014-11-08 NOTE — H&P (View-Only) (Signed)
[name] Date of Birth: September 24, 1941 Medical Record #[id number]  History of Present Illness: [name] is seen back today for a work in visit - seen for Dr. Rayann Heman. He is a 73 year old male with diastolic HF, morbid obesity, PAF with past cardioversion, venous insufficiency, CKD, anemia, hypothyroidism, DM, COPD, OSA on CPAP, HLD, SSS with PPM in place, CAD (no specifics noted), HTN and depression.   Seen here at the end of July of 2015 - had been admitted earlier in the summer with diastolic heart failure and was cardioverted. Remained in sinus at that visit. Was switched to Eliquis from Coumadin. Florinef and Lasix were cut back - felt to be with volume depletion and looked "dry", PPM check was ok. Support stockings recommended as well as rehab. Most of his issues center around his obesity - he has significant trouble with just minimal walking.   I saw him back in October - he was "weak" but cardiac status seemed ok. Was getting ready to have a cystoscopy by [facility].   Called earlier this morning with rapid pulse - extra dose of Lopressor advised and added to my schedule.   Comes in today. Here alone. He does not feel well.Has not felt well for about a week.  He is having palpitations. Feels his heart beating fast at times. Weak. May be a bit more short of breath. No extra swelling. Morbidly obese. He remains on his Eliquis and has not missed any doses.   Current Outpatient Prescriptions  Medication Sig Dispense Refill  . acetaminophen (TYLENOL) 500 MG tablet Take 500 mg by mouth 2 (two) times daily as needed for moderate pain (pain).     Marland Kitchen albuterol (PROAIR HFA) 108 (90 BASE) MCG/ACT inhaler Inhale 2 puffs into the lungs every 6 (six) hours as needed for wheezing or shortness of breath.    . ALPRAZolam (XANAX) 0.25 MG tablet Take 0.25 mg by mouth 3 (three) times daily as needed for anxiety.    Marland Kitchen apixaban (ELIQUIS) 5 MG TABS tablet Take 1 tablet (5 mg total) by mouth 2 (two) times daily. 180  tablet 3  . Ascorbic Acid (VITAMIN C) 500 MG tablet Take 500 mg by mouth daily.     . brimonidine (ALPHAGAN P) 0.1 % SOLN Place 1 drop into the left eye 3 (three) times daily.     . calcium carbonate (TUMS - DOSED IN MG ELEMENTAL CALCIUM) 500 MG chewable tablet Chew 2 tablets by mouth 2 (two) times daily as needed for indigestion or heartburn.    . cholecalciferol (VITAMIN D) 1000 UNITS tablet Take 1,000 Units by mouth daily.    . CRESTOR 40 MG tablet TAKE 1/2 TABLET EVERY EVENING 45 tablet 2  . dextromethorphan (DELSYM) 30 MG/5ML liquid Take 90 mg by mouth 2 (two) times daily as needed for cough.    . dorzolamide-timolol (COSOPT) 22.3-6.8 MG/ML ophthalmic solution Place 1 drop into both eyes 2 (two) times daily.     . fluticasone (FLONASE) 50 MCG/ACT nasal spray Place 2 sprays into both nostrils daily as needed for allergies or rhinitis.    . furosemide (LASIX) 40 MG tablet Take one tab (40mg ) in the AM, and 1/2 tab (20mg ) in the PM 135 tablet 3  . insulin regular human CONCENTRATED (HUMULIN R) 500 UNIT/ML SOLN injection Inject 250 units 4 times a day. 90 vial 1  . Lactobacillus (ACIDOPHILUS) CAPS capsule Take 1 capsule by mouth daily.    Marland Kitchen latanoprost (XALATAN) 0.005 %  ophthalmic solution Place 1 drop into both eyes at bedtime.    Marland Kitchen levothyroxine (SYNTHROID, LEVOTHROID) 150 MCG tablet Take 50 mcg by mouth every morning.     . metoprolol tartrate (LOPRESSOR) 25 MG tablet Take 1 tablet (25 mg total) by mouth 2 (two) times daily. 180 tablet 1  . Multiple Vitamin (MULTIVITAMIN WITH MINERALS) TABS tablet Take 1 tablet by mouth daily.    . Omega-3 Fatty Acids (FISH OIL) 1000 MG CAPS Take 1,000 mg by mouth every morning.     Marland Kitchen omeprazole (PRILOSEC) 20 MG capsule Take 20 mg by mouth every morning.    . senna-docusate (SENOKOT-S) 8.6-50 MG per tablet Take 1 tablet by mouth 2 (two) times daily. While taking pain meds to prevent constipation. 20 tablet 0  . traMADol (ULTRAM) 50 MG tablet Take 2 tablets  (100 mg total) by mouth 2 (two) times daily as needed (pain). 50 tablet 3  . traMADol (ULTRAM) 50 MG tablet Take 1-2 tablets (50-100 mg total) by mouth every 6 (six) hours as needed for moderate pain. Post-operatively 20 tablet 0  . venlafaxine XR (EFFEXOR-XR) 150 MG 24 hr capsule Take 150 mg by mouth daily as needed (anxiety.).     No current facility-administered medications for this visit.    Allergies  Allergen Reactions  . Actos [Pioglitazone] Swelling    Past Medical History  Diagnosis Date  . Diabetes mellitus   . COPD (chronic obstructive pulmonary disease)     CPAP  . Renal insufficiency   . Hyperlipemia   . Hypothyroidism   . Hyperkalemia   . 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 Sick sinus syndrome     s/p PPM by JA  . Pancreatitis   . Morbid obesity   . OSA on CPAP     using CPAP although it is hard for him to tolerate  . CAD (coronary artery disease)   . Hypertensive cardiovascular-renal disease   . Depression   . Anxiety   . H/O Legionnaire's disease 2003  . History of blood clots     R groin  . Peripheral neuropathy   . Osteoarthritis     fingers  . GERD (gastroesophageal reflux disease)   . Anemia     hx of  . Pneumonia 2003  . Cancer 2014    bladder cancer AND RIGHT URETERAL CANCER  . Diastolic dysfunction with chronic heart failure   . Persistent atrial fibrillation   . Atypical atrial flutter   . Venous insufficiency   . Complete heart block     cardioverted from A.Fib,hx. Sick sinus syndrome -Pacemaker implanted  . Pacemaker     implanted [employer] 7'11  . CHF (congestive heart failure)   . Gait difficulty     slow gait"ambulates with walker"    Past Surgical History  Procedure Laterality Date  . Nasal septum surgery  1967  . Pacemaker placement  06/2010    [facility] Accent RF DR, Model [id number] ( Serial number [id number])  . Thromboembolectomy and four compartment fasciotomy  2009    GROIN AREA  . Cataract extraction  Right   . Cholecystectomy  2012  . Insert / replace / remove pacemaker  2011  . Transurethral resection of bladder tumor with gyrus (turbt-gyrus) N/A 10/26/2013    Procedure: TRANSURETHRAL RESECTION OF BLADDER TUMOR WITH GYRUS (TURBT-GYRUS);  Surgeon: Alexis Frock, MD;  Location: [facility];  Service: Urology;  Laterality: N/A;  .  Cystoscopy w/ ureteral stent placement Right 10/26/2013    Procedure: CYSTOSCOPY WITH RETROGRADE PYELOGRAM/URETERAL STENT PLACEMENT;  Surgeon: Alexis Frock, MD;  Location: [facility];  Service: Urology;  Laterality: Right;  . Embolectomy Left 11/02/2013    Procedure: LEFT FEMORAL EMBOLECTOMY, LEFT FEMORAL ARTERY ENDARTERECTOMY WITH DACRON PATCH ANGIOPLASTY.;  Surgeon: Mal Misty, MD;  Location: [place];  Service: Vascular;  Laterality: Left;  . Transurethral resection of bladder tumor with gyrus (turbt-gyrus) N/A 01/11/2014    Procedure: TRANSURETHRAL RESECTION OF BLADDER TUMOR WITH GYRUS (TURBT-GYRUS);  Surgeon: Alexis Frock, MD;  Location: [facility];  Service: Urology;  Laterality: N/A;  . Cystoscopy with ureteroscopy and stent placement Right 01/11/2014    Procedure: CYSTOSCOPY WITH URETEROSCOPY ,RIGHT RETROGRADE AND STENT CHANGE AND LASER OF URETERAL TUMOR;  Surgeon: Alexis Frock, MD;  Location: [facility];  Service: Urology;  Laterality: Right;  . Holmium laser application Right 01/08/3085    Procedure: HOLMIUM LASER APPLICATION;  Surgeon: Alexis Frock, MD;  Location: [facility];  Service: Urology;  Laterality: Right;  . Tee without cardioversion N/A 05/16/2014    Procedure: TRANSESOPHAGEAL ECHOCARDIOGRAM (TEE);  Surgeon: Josue Hector, MD;  Location: [facility];  Service: Cardiovascular;  Laterality: N/A;  . Cardioversion N/A 05/16/2014    Procedure: CARDIOVERSION;  Surgeon: Josue Hector, MD;  Location[facility];  Service: Cardiovascular;  Laterality: N/A;  . Cardiac catheterization      11-02-13  . Cystoscopy/retrograde/ureteroscopy Right 08/23/2014    Procedure:  CYSTOSCOPY/RETROGRADE/ DIAGNOSTIC URETEROSCOPY/RIGHT RENAL STONE EXTRACTION;  Surgeon: Alexis Frock, MD;  Location: [facility];  Service: Urology;  Laterality: Right;    History  Smoking status  . Former Smoker -- 2.00 packs/day for 50 years  . Types: Cigarettes  . Quit date: 12/08/2006  Smokeless tobacco  . Never Used    History  Alcohol Use No    Comment: quit 3 years ago    Family History  Problem Relation Age of Onset  . Liver cancer Mother     deceased age 24  . Cancer Mother     liver cancer  . Colon cancer Neg Hx   . Heart attack Father     Review of Systems: The review of systems is per the HPI.  All other systems were reviewed and are negative.  Physical Exam: BP 108/94 mmHg  Pulse 70  Ht 5\' 10"  (1.778 m)  Wt 313 lb 6.4 oz (142.157 kg)  BMI 44.97 kg/m2 Patient is very pleasant and in no acute distress. He is obese.  He is chronically ill appearing. Skin is warm and dry. Color is normal.  HEENT is unremarkable. Normocephalic/atraumatic. PERRL. Sclera are nonicteric. Neck is supple. No masses. No JVD. Lungs are clear. Cardiac exam shows a fairly regular rate and rhythm but presumed paced. Abdomen is obese but soft. Extremities are with edema. Gait and ROM are intact but he is using a walker and moving slow. No gross neurologic deficits noted.  Wt Readings from Last 3 Encounters:  11/06/14 313 lb 6.4 oz (142.157 kg)  08/23/14 310 lb (140.615 kg)  08/21/14 310 lb 6.4 oz (140.797 kg)    LABORATORY DATA/PROCEDURES: EKG shows paced rhythm with underlying AF.  Device check shows AF since 10/31/2014. No real high VR noted.    Lab Results  Component Value Date   WBC 10.3 08/17/2014   HGB 13.0 08/17/2014   HCT 39.6 08/17/2014   PLT 236 08/17/2014   GLUCOSE 214* 08/17/2014   CHOL 123 08/09/2014   TRIG *  08/09/2014    470.0 Triglyceride is over 400; calculations on Lipids are invalid.   HDL 25.80* 08/09/2014   LDLDIRECT 58.9 08/09/2014   LDLCALC  05/07/2008     UNABLE TO CALCULATE IF TRIGLYCERIDE OVER 400 mg/dL        Total Cholesterol/HDL:CHD Risk Coronary Heart Disease Risk Table                     Men   Women  1/2 Average Risk   3.4   3.3   ALT 51 08/09/2014   AST 58* 08/09/2014   NA 140 08/17/2014   K 4.1 08/17/2014   CL 99 08/17/2014   CREATININE 1.43* 08/17/2014   BUN 34* 08/17/2014   CO2 22 08/17/2014   TSH 1.20 08/09/2014   PSA 0.61 08/09/2014   INR 2.8 06/28/2014   HGBA1C 7.9* 08/09/2014   MICROALBUR 170.0 Repeated and verified X2.* 06/27/2013    BNP (last 3 results)  Recent Labs  05/13/14 1338 05/17/14 0540 06/07/14 1402  PROBNP 2142.0* 590.6* 77.0    Echo Study Conclusions from June 2015  - Left ventricle: Wall thickness was increased in a pattern of severe LVH. The estimated ejection fraction was 55%. - Aortic valve: There was very mild stenosis. - Mitral valve: Mildly calcified annulus. There was mild regurgitation. - Left atrium: The atrium was dilated. No evidence of thrombus in the atrial cavity or appendage. - Right atrium: No evidence of thrombus in the atrial cavity or appendage. - Atrial septum: No defect or patent foramen ovale was identified. Echo contrast study showed no right-to-left atrial level shunt, following an increase in RA pressure induced by provocative maneuvers. - Tricuspid valve: No evidence of vegetation. - Pulmonic valve: No evidence of vegetation. - Impressions: No LAA thrombus Proceeded with succesful [place] under propofol anesthesia.  Impressions:  - No LAA thrombus Proceeded with succesful [facility] under propofol anesthesia.    Assessment / Plan: 1. PAF - prior cardioversion back in June of 2015 - has been back in AF for 6 days - symptomatic. Will increase Lopressor to 25 mg TID. Arrange cardioversion for symptom relief. Arrange OV with Dr. Rayann Heman to discuss drug therapy. May not be much to offer. I will see as needed.   2. Chronic diastolic HF -weight stable. Will need to watch  closely.   3. Morbid obesity  His overall situation is quite tenuous at best.   Patient is agreeable to this plan and will call if any problems develop in the interim.   Burtis Junes, RN, [place] [address] [place], [place]  [phone]

## 2014-11-08 NOTE — Interval H&P Note (Signed)
History and Physical Interval Note:  11/08/2014 11:53 AM  [name]  has presented today for surgery, with the diagnosis of afib  The various methods of treatment have been discussed with the patient and family. After consideration of risks, benefits and other options for treatment, the patient has consented to  Procedure(s): CARDIOVERSION (N/A) as a surgical intervention .  The patient's history has been reviewed, patient examined, no change in status, stable for surgery.  I have reviewed the patient's chart and labs.  Questions were answered to the patient's satisfaction.     [name]

## 2014-11-08 NOTE — Anesthesia Postprocedure Evaluation (Signed)
  Anesthesia Post-op Note  Patient: [name]  Procedure(s) Performed: Procedure(s): CARDIOVERSION (N/A)  Patient Location: PACU  Anesthesia Type:General  Level of Consciousness: awake, alert , oriented and sedated  Airway and Oxygen Therapy: Patient Spontanous Breathing and Patient connected to nasal cannula oxygen  Post-op Pain: none  Post-op Assessment: Post-op Vital signs reviewed  Post-op Vital Signs: Reviewed and stable  Last Vitals:  Filed Vitals:   11/08/14 1148  BP: 117/41  Pulse:   Resp:     Complications: No apparent anesthesia complications

## 2014-11-09 ENCOUNTER — Encounter (HOSPITAL_COMMUNITY): Payer: Self-pay | Admitting: Internal Medicine

## 2014-11-09 ENCOUNTER — Other Ambulatory Visit: Payer: Self-pay | Admitting: Endocrinology

## 2014-11-17 ENCOUNTER — Encounter: Payer: Self-pay | Admitting: Internal Medicine

## 2014-11-20 ENCOUNTER — Ambulatory Visit (INDEPENDENT_AMBULATORY_CARE_PROVIDER_SITE_OTHER): Payer: Commercial Managed Care - HMO | Admitting: Pulmonary Disease

## 2014-11-20 ENCOUNTER — Encounter: Payer: Self-pay | Admitting: Pulmonary Disease

## 2014-11-20 VITALS — BP 124/68 | HR 75 | Temp 98.0°F | Ht 70.0 in

## 2014-11-20 DIAGNOSIS — G4733 Obstructive sleep apnea (adult) (pediatric): Secondary | ICD-10-CM

## 2014-11-20 NOTE — Patient Instructions (Signed)
Stay on cpap, and we will get you referred to a home care company for new supplies that is covered by your insurance. Will send in prescription for ventolin. Work on weight loss. followup with me again in one year.

## 2014-11-20 NOTE — Progress Notes (Signed)
   Subjective:    Patient ID: [name], male    DOB: 1941/10/24, 73 y.o.   MRN: [id number]  HPI Patient comes in today for follow-up of his obstructive sleep apnea. He is wearing his C-peptide fairly compliantly, but has very aged supplies, including his mask. This makes wearing C Pap uncomfortable on some nights. He tells me that his insurance no longer covers supplies from his current home care company, and we'll therefore need another referral. Our scale is broken today, but the patient states his weight is up a few pounds from the last visit a year ago. Overall, he feels that he sleeps fairly well with the device.   Review of Systems  Constitutional: Negative for fever and unexpected weight change.  HENT: Negative for congestion, dental problem, ear pain, nosebleeds, postnasal drip, rhinorrhea, sinus pressure, sneezing, sore throat and trouble swallowing.   Eyes: Negative for redness and itching.  Respiratory: Negative for cough, chest tightness, shortness of breath and wheezing.   Cardiovascular: Negative for palpitations and leg swelling.  Gastrointestinal: Negative for nausea and vomiting.  Genitourinary: Negative for dysuria.  Musculoskeletal: Negative for joint swelling.  Skin: Negative for rash.  Neurological: Negative for headaches.  Hematological: Does not bruise/bleed easily.  Psychiatric/Behavioral: Negative for dysphoric mood. The patient is not nervous/anxious.        Objective:   Physical Exam Morbidly obese male in no acute distress Nose without purulence or discharge noted No skin breakdown or pressure necrosis from the C Pap mask Neck without lymphadenopathy or thyromegaly Lower extremities with significant edema, no cyanosis Alert and oriented, moves all 4 extremities.       Assessment & Plan:

## 2014-11-20 NOTE — Assessment & Plan Note (Signed)
The patient has been wearing C Pap compliantly, but has not gotten new supplies in almost 3 years. His insurance currently does not have a contract with his current home care company. We'll send an order for new supplies, and get him with a new home care company. I've also encouraged him to work aggressively on weight loss.

## 2014-11-21 ENCOUNTER — Ambulatory Visit (INDEPENDENT_AMBULATORY_CARE_PROVIDER_SITE_OTHER): Payer: Commercial Managed Care - HMO | Admitting: Endocrinology

## 2014-11-21 ENCOUNTER — Encounter: Payer: Self-pay | Admitting: Endocrinology

## 2014-11-21 VITALS — BP 124/64 | HR 90 | Temp 98.2°F | Ht 70.0 in | Wt 313.0 lb

## 2014-11-21 DIAGNOSIS — E1065 Type 1 diabetes mellitus with hyperglycemia: Principal | ICD-10-CM

## 2014-11-21 DIAGNOSIS — IMO0002 Reserved for concepts with insufficient information to code with codable children: Secondary | ICD-10-CM

## 2014-11-21 DIAGNOSIS — E1029 Type 1 diabetes mellitus with other diabetic kidney complication: Secondary | ICD-10-CM

## 2014-11-21 LAB — HEMOGLOBIN A1C: Hgb A1c MFr Bld: 8.6 % — ABNORMAL HIGH (ref 4.6–6.5)

## 2014-11-21 NOTE — Progress Notes (Signed)
Subjective:    Patient ID: [name], male    DOB: [birth year], 73 y.o.   MRN: [id number]  HPI Pt returns for f/u of diabetes mellitus: DM type: Insulin-requiring type 2 Dx'ed: 0350 Complications: renal insufficiency, polyneuropathy, and CAD Therapy: insulin since 1989.  DKA: never Severe hypoglycemia: never Pancreatitis: several episodes, most recently in 2013 Other: he has severe insulin resistance; he takes multiple daily injections of U-500.   Interval history: he brings a record of his cbg's which i have reviewed today.  It varies from 41-200's.  There is no trend throughout the day.  pt states he feels well in general. Past Medical History  Diagnosis Date  . Diabetes mellitus   . COPD (chronic obstructive pulmonary disease)     CPAP  . Renal insufficiency   . Hyperlipemia   . Hypothyroidism   . Hyperkalemia   . 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 Sick sinus syndrome     s/p PPM by JA  . Pancreatitis   . Morbid obesity   . OSA on CPAP     using CPAP although it is hard for him to tolerate  . CAD (coronary artery disease)   . Hypertensive cardiovascular-renal disease   . Depression   . Anxiety   . H/O Legionnaire's disease 2003  . History of blood clots     R groin  . Peripheral neuropathy   . Osteoarthritis     fingers  . GERD (gastroesophageal reflux disease)   . Anemia     hx of  . Pneumonia 2003  . Cancer 2014    bladder cancer AND RIGHT URETERAL CANCER  . Diastolic dysfunction with chronic heart failure   . Persistent atrial fibrillation   . Atypical atrial flutter   . Venous insufficiency   . Complete heart block     cardioverted from A.Fib,hx. Sick sinus syndrome -Pacemaker implanted  . Pacemaker     implanted St. Jude 7'11  . CHF (congestive heart failure)   . Gait difficulty     slow gait"ambulates with walker"    Past Surgical History  Procedure Laterality Date  . Nasal septum surgery  1967  . Pacemaker placement  06/2010     [facility] Accent RF DR, Model [id number] ( Serial number [id number])  . Thromboembolectomy and four compartment fasciotomy  2009    GROIN AREA  . Cataract extraction Right   . Cholecystectomy  2012  . Insert / replace / remove pacemaker  2011  . Transurethral resection of bladder tumor with gyrus (turbt-gyrus) N/A 10/26/2013    Procedure: TRANSURETHRAL RESECTION OF BLADDER TUMOR WITH GYRUS (TURBT-GYRUS);  Surgeon: Alexis Frock, MD;  [facility];  Service: Urology;  Laterality: N/A;  . Cystoscopy w/ ureteral stent placement Right 10/26/2013    Procedure: CYSTOSCOPY WITH RETROGRADE PYELOGRAM/URETERAL STENT PLACEMENT;  Surgeon: Alexis Frock, MD;  [facility];  Service: Urology;  Laterality: Right;  . Embolectomy Left 11/02/2013    Procedure: LEFT FEMORAL EMBOLECTOMY, LEFT FEMORAL ARTERY ENDARTERECTOMY WITH DACRON PATCH ANGIOPLASTY.;  Surgeon: Mal Misty, MD;  Location: [place];  Service: Vascular;  Laterality: Left;  . Transurethral resection of bladder tumor with gyrus (turbt-gyrus) N/A 01/11/2014    Procedure: TRANSURETHRAL RESECTION OF BLADDER TUMOR WITH GYRUS (TURBT-GYRUS);  Surgeon: Alexis Frock, MD;  [facility];  Service: Urology;  Laterality: N/A;  . Cystoscopy with ureteroscopy and stent placement Right 01/11/2014  Procedure: CYSTOSCOPY WITH URETEROSCOPY ,RIGHT RETROGRADE AND STENT CHANGE AND LASER OF URETERAL TUMOR;  Surgeon: Alexis Frock, MD;  [facility];  Service: Urology;  Laterality: Right;  . Holmium laser application Right 0/08/9832    Procedure: HOLMIUM LASER APPLICATION;  Surgeon: Alexis Frock, MD;  [facility];  Service: Urology;  Laterality: Right;  . Tee without cardioversion N/A 05/16/2014    Procedure: TRANSESOPHAGEAL ECHOCARDIOGRAM (TEE);  Surgeon: Josue Hector, MD;  Location: [facility] ENDOSCOPY;  Service: Cardiovascular;  Laterality: N/A;  . Cardioversion N/A 05/16/2014    Procedure: CARDIOVERSION;  Surgeon: Josue Hector, MD;  Location: [facility]  ENDOSCOPY;  Service: Cardiovascular;  Laterality: N/A;  . Cardiac catheterization      11-02-13  . Cystoscopy/retrograde/ureteroscopy Right 08/23/2014    Procedure: CYSTOSCOPY/RETROGRADE/ DIAGNOSTIC URETEROSCOPY/RIGHT RENAL STONE EXTRACTION;  Surgeon: Alexis Frock, MD;  [facility];  Service: Urology;  Laterality: Right;  . Cardioversion N/A 11/08/2014    Procedure: CARDIOVERSION;  Surgeon: Fay Records, MD;  Location: [facility] ENDOSCOPY;  Service: Cardiovascular;  Laterality: N/A;    History   Social History  . Marital Status: Married    Spouse Name: N/A    Number of Children: N/A  . 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 Smoking status: Former Smoker -- 2.00 packs/day for 50 years    Types: Cigarettes    Quit date: 12/08/2006  . Smokeless tobacco: Never Used  . Alcohol Use: No     Comment: quit 3 years ago  . Drug Use: No  . Sexual Activity: Not Currently   Other Topics Concern  . Not on file   Social History Narrative    Current Outpatient Prescriptions on File Prior to Visit  Medication Sig Dispense Refill  . acetaminophen (TYLENOL) 500 MG tablet Take 500 mg by mouth 2 (two) times daily as needed for moderate pain (pain).     Marland Kitchen albuterol (PROAIR HFA) 108 (90 BASE) MCG/ACT inhaler Inhale 2 puffs into the lungs every 6 (six) hours as needed for wheezing or shortness of breath.    . ALPRAZolam (XANAX) 0.25 MG tablet Take 0.25 mg by mouth 3 (three) times daily as needed for anxiety.    Marland Kitchen apixaban (ELIQUIS) 5 MG TABS tablet Take 1 tablet (5 mg total) by mouth 2 (two) times daily. 180 tablet 3  . Ascorbic Acid (VITAMIN C) 500 MG tablet Take 500 mg by mouth daily.     . brimonidine (ALPHAGAN P) 0.1 % SOLN Place 1 drop into the left eye 3 (three) times daily.     . calcium carbonate (TUMS - DOSED IN MG ELEMENTAL CALCIUM) 500 MG chewable tablet Chew 2 tablets by mouth 2 (two) times daily as needed for  indigestion or heartburn.    . cholecalciferol (VITAMIN D) 1000 UNITS tablet Take 1,000 Units by mouth daily.    . CRESTOR 40 MG tablet TAKE 1/2 TABLET EVERY EVENING 45 tablet 2  . dextromethorphan (DELSYM) 30 MG/5ML liquid Take 90 mg by mouth 2 (two) times daily as needed for cough.    . dorzolamide-timolol (COSOPT) 22.3-6.8 MG/ML ophthalmic solution Place 1 drop into both eyes 2 (two) times daily.     . fluticasone (FLONASE) 50 MCG/ACT nasal spray Place 2 sprays into both nostrils daily as needed for allergies or rhinitis.    . furosemide (LASIX) 40 MG tablet Take one tab (40mg ) in the  AM, and 1/2 tab (20mg ) in the PM 135 tablet 3  . HUMULIN R 500 UNIT/ML SOLN injection DRAW UP 50 UNITS ON U100 SYRINGE (250 ACTUAL INSULIN UNITS) AND INJECT SUBCUTANEOUSLY 4 TIMES DAILY. 80 mL 2  . Lactobacillus (ACIDOPHILUS) CAPS capsule Take 1 capsule by mouth daily.    Marland Kitchen latanoprost (XALATAN) 0.005 % ophthalmic solution Place 1 drop into both eyes at bedtime.    Marland Kitchen levothyroxine (SYNTHROID, LEVOTHROID) 150 MCG tablet Take 50 mcg by mouth every morning.     . metoprolol tartrate (LOPRESSOR) 25 MG tablet Take 1 tablet (25 mg total) by mouth 3 (three) times daily. 180 tablet 1  . Multiple Vitamin (MULTIVITAMIN WITH MINERALS) TABS tablet Take 1 tablet by mouth daily.    . Omega-3 Fatty Acids (FISH OIL) 1000 MG CAPS Take 1,000 mg by mouth every morning.     Marland Kitchen omeprazole (PRILOSEC) 20 MG capsule Take 20 mg by mouth every morning.    . senna-docusate (SENOKOT-S) 8.6-50 MG per tablet Take 1 tablet by mouth 2 (two) times daily. While taking pain meds to prevent constipation. 20 tablet 0  . traMADol (ULTRAM) 50 MG tablet Take 2 tablets (100 mg total) by mouth 2 (two) times daily as needed (pain). 50 tablet 3  . venlafaxine XR (EFFEXOR-XR) 150 MG 24 hr capsule Take 150 mg by mouth daily as needed (anxiety.).     No current facility-administered medications on file prior to visit.    Allergies  Allergen Reactions  .  Actos [Pioglitazone] Swelling    Family History  Problem Relation Age of Onset  . Liver cancer Mother     deceased age 34  . Cancer Mother     liver cancer  . Colon cancer Neg Hx   . Heart attack Father     BP 124/64 mmHg  Pulse 90  Temp(Src) 98.2 F (36.8 C) (Oral)  Ht 5\' 10"  (1.778 m)  Wt 313 lb (141.976 kg)  BMI 44.91 kg/m2  SpO2 94%  Review of Systems He denies LOC and weight change.      Objective:   Physical Exam VITAL SIGNS:  See vs page GENERAL: no distress Pulses: dorsalis pedis intact bilat.  EXTEMITIES: no deformity. no ulcer on the feet. feet are of normal color and temp. 1+ bilat leg edema. Several healed surgical scars at the right leg. There is bilateral onychomycosis. There is very dry skin and hyperpigmentation of the legs.  Neuro: sensation is intact to touch on the feet, but decreased from normal.    Lab Results  Component Value Date   HGBA1C 8.6* 11/21/2014      Assessment & Plan:  DM: mod exacerbation. Side-effect of rx: hypoglycemia, persistent: this limits insulin rx.  Patient is advised the following: Patient Instructions  A diabetes blood test is requested for you today.  We'll contact you with results. Please come back for a follow-up appointment in 3 months. check your blood sugar twice a day.  vary the time of day when you check, between before the 3 meals, and at bedtime.  also check if you have symptoms of your blood sugar being too high or too low.  please keep a record of the readings and bring it to your next appointment here.  You can write it on any piece of paper.  please call us sooner if your blood sugar goes below 70, or if you have a lot of readings over 200.   we can't safely increase the insulin. Please  continue the same for now

## 2014-11-21 NOTE — Patient Instructions (Addendum)
A diabetes blood test is requested for you today.  We'll contact you with results. Please come back for a follow-up appointment in 3 months. check your blood sugar twice a day.  vary the time of day when you check, between before the 3 meals, and at bedtime.  also check if you have symptoms of your blood sugar being too high or too low.  please keep a record of the readings and bring it to your next appointment here.  You can write it on any piece of paper.  please call us sooner if your blood sugar goes below 70, or if you have a lot of readings over 200.

## 2014-11-22 ENCOUNTER — Ambulatory Visit (INDEPENDENT_AMBULATORY_CARE_PROVIDER_SITE_OTHER): Payer: Commercial Managed Care - HMO | Admitting: Nurse Practitioner

## 2014-11-22 ENCOUNTER — Encounter: Payer: Self-pay | Admitting: Nurse Practitioner

## 2014-11-22 VITALS — BP 100/58 | HR 77 | Ht 70.0 in | Wt 309.1 lb

## 2014-11-22 DIAGNOSIS — Z9889 Other specified postprocedural states: Secondary | ICD-10-CM

## 2014-11-22 DIAGNOSIS — I5032 Chronic diastolic (congestive) heart failure: Secondary | ICD-10-CM

## 2014-11-22 DIAGNOSIS — I480 Paroxysmal atrial fibrillation: Secondary | ICD-10-CM

## 2014-11-22 DIAGNOSIS — Z9289 Personal history of other medical treatment: Secondary | ICD-10-CM

## 2014-11-22 DIAGNOSIS — N183 Chronic kidney disease, stage 3 (moderate): Secondary | ICD-10-CM

## 2014-11-22 MED ORDER — METOPROLOL TARTRATE 25 MG PO TABS: 25.0000 mg | ORAL_TABLET | Freq: Three times a day (TID) | ORAL | Status: DC

## 2014-11-22 NOTE — Patient Instructions (Addendum)
Stay on your current medicines  I sent in a refill to [facility] for your metoprolol  See Dr. Rayann Heman to discuss your medicines in a month as planned  Call the [place] office at [phone] if you have any questions, problems or concerns.

## 2014-11-22 NOTE — Progress Notes (Signed)
[name] Date of Birth: August 26, 1941 Medical Record #[id number]  History of Present Illness: Mr. Kiener is seen back today for a follow up visit - seen for Dr. Rayann Heman. He is a 73 year old male with diastolic HF, morbid obesity, PAF with past cardioversion, venous insufficiency, CKD, anemia, hypothyroidism, DM, COPD, OSA on CPAP, HLD, SSS with PPM in place, CAD (no specifics noted), HTN and depression.   Seen here at the end of July of 2015 - had been admitted earlier in the summer with diastolic heart failure and was cardioverted. Remained in sinus at that visit. Was switched to Eliquis from Coumadin. Florinef and Lasix were cut back - felt to be with volume depletion and looked "dry", PPM check was ok. Support stockings recommended as well as rehab. Most of his issues center around his obesity - he has significant trouble with just minimal walking.   I saw him back in October - he was "weak" but cardiac status seemed ok. Was getting ready to have a cystoscopy by GU.   I saw him in the flex clinic at the end of November - he was back in AF and symptomatic. Referred to Dr. Rayann Heman for AAD therapy and had him cardioverted for symptom relief in the interim - this was successful.   Comes in today. Here alone. He is feeling better. Less dyspnea and swelling. BP ok at home. No chest pain. Has seen Dr. Gwenette Greet for his CPAP and Dr. [name] this week for his DM. No new issues.   Current Outpatient Prescriptions  Medication Sig Dispense Refill  . acetaminophen (TYLENOL) 500 MG tablet Take 500 mg by mouth 2 (two) times daily as needed for moderate pain (pain).     Marland Kitchen albuterol (PROAIR HFA) 108 (90 BASE) MCG/ACT inhaler Inhale 2 puffs into the lungs every 6 (six) hours as needed for wheezing or shortness of breath.    . ALPRAZolam (XANAX) 0.25 MG tablet Take 0.25 mg by mouth 3 (three) times daily as needed for anxiety.    Marland Kitchen apixaban (ELIQUIS) 5 MG TABS tablet Take 1 tablet (5 mg total) by mouth 2 (two)  times daily. 180 tablet 3  . Ascorbic Acid (VITAMIN C) 500 MG tablet Take 500 mg by mouth daily.     . brimonidine (ALPHAGAN P) 0.1 % SOLN Place 1 drop into the left eye 3 (three) times daily.     . cholecalciferol (VITAMIN D) 1000 UNITS tablet Take 1,000 Units by mouth daily.    . CRESTOR 40 MG tablet TAKE 1/2 TABLET EVERY EVENING 45 tablet 2  . dextromethorphan (DELSYM) 30 MG/5ML liquid Take 90 mg by mouth 2 (two) times daily as needed for cough.    . dorzolamide-timolol (COSOPT) 22.3-6.8 MG/ML ophthalmic solution Place 1 drop into both eyes 2 (two) times daily.     . fluticasone (FLONASE) 50 MCG/ACT nasal spray Place 2 sprays into both nostrils daily as needed for allergies or rhinitis.    . furosemide (LASIX) 40 MG tablet Take one tab (40mg ) in the AM, and 1/2 tab (20mg ) in the PM 135 tablet 3  . guaiFENesin (MUCINEX) 600 MG 12 hr tablet Take by mouth as needed.    Marland Kitchen HUMULIN R 500 UNIT/ML SOLN injection DRAW UP 50 UNITS ON U100 SYRINGE (250 ACTUAL INSULIN UNITS) AND INJECT SUBCUTANEOUSLY 4 TIMES DAILY. 80 mL 2  . Lactobacillus (ACIDOPHILUS) CAPS capsule Take 1 capsule by mouth daily.    Marland Kitchen latanoprost (XALATAN) 0.005 % ophthalmic solution Place  1 drop into both eyes at bedtime.    Marland Kitchen levothyroxine (SYNTHROID, LEVOTHROID) 150 MCG tablet Take 50 mcg by mouth every morning.     . metoprolol tartrate (LOPRESSOR) 25 MG tablet Take 1 tablet (25 mg total) by mouth 3 (three) times daily. 180 tablet 1  . Multiple Vitamin (MULTIVITAMIN WITH MINERALS) TABS tablet Take 1 tablet by mouth daily.    . Omega-3 Fatty Acids (FISH OIL) 1000 MG CAPS Take 1,000 mg by mouth every morning.     Marland Kitchen omeprazole (PRILOSEC) 20 MG capsule Take 40 mg by mouth every morning.     . senna-docusate (SENOKOT-S) 8.6-50 MG per tablet Take 1 tablet by mouth 2 (two) times daily. While taking pain meds to prevent constipation. 20 tablet 0  . traMADol (ULTRAM) 50 MG tablet Take 2 tablets (100 mg total) by mouth 2 (two) times daily as  needed (pain). 50 tablet 3  . venlafaxine XR (EFFEXOR-XR) 150 MG 24 hr capsule Take 150 mg by mouth daily as needed (anxiety.).    Marland Kitchen calcium carbonate (TUMS - DOSED IN MG ELEMENTAL CALCIUM) 500 MG chewable tablet Chew 2 tablets by mouth 2 (two) times daily as needed for indigestion or heartburn.     No current facility-administered medications for this visit.    Allergies  Allergen Reactions  . Actos [Pioglitazone] Swelling    Past Medical History  Diagnosis Date  . Diabetes mellitus   . COPD (chronic obstructive pulmonary disease)     CPAP  . Renal insufficiency   . Hyperlipemia   . Hypothyroidism   . Hyperkalemia   . 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 Sick sinus syndrome     s/p PPM by JA  . Pancreatitis   . Morbid obesity   . OSA on CPAP     using CPAP although it is hard for him to tolerate  . CAD (coronary artery disease)   . Hypertensive cardiovascular-renal disease   . Depression   . Anxiety   . H/O Legionnaire's disease 2003  . History of blood clots     R groin  . Peripheral neuropathy   . Osteoarthritis     fingers  . GERD (gastroesophageal reflux disease)   . Anemia     hx of  . Pneumonia 2003  . Cancer 2014    bladder cancer AND RIGHT URETERAL CANCER  . Diastolic dysfunction with chronic heart failure   . Persistent atrial fibrillation   . Atypical atrial flutter   . Venous insufficiency   . Complete heart block     cardioverted from A.Fib,hx. Sick sinus syndrome -Pacemaker implanted  . Pacemaker     implanted St. Jude 7'11  . CHF (congestive heart failure)   . Gait difficulty     slow gait"ambulates with walker"    Past Surgical History  Procedure Laterality Date  . Nasal septum surgery  1967  . Pacemaker placement  06/2010    [facility] Accent RF DR, Model [id number] ( Serial number [id number])  . Thromboembolectomy and four compartment fasciotomy  2009    GROIN AREA  . Cataract extraction Right   . Cholecystectomy  2012  .  Insert / replace / remove pacemaker  2011  . Transurethral resection of bladder tumor with gyrus (turbt-gyrus) N/A 10/26/2013    Procedure: TRANSURETHRAL RESECTION OF BLADDER TUMOR WITH GYRUS (TURBT-GYRUS);  Surgeon: Alexis Frock, MD;  [facility];  Service: Urology;  Laterality:  N/A;  . Cystoscopy w/ ureteral stent placement Right 10/26/2013    Procedure: CYSTOSCOPY WITH RETROGRADE PYELOGRAM/URETERAL STENT PLACEMENT;  Surgeon: Alexis Frock, MD;  [facility];  Service: Urology;  Laterality: Right;  . Embolectomy Left 11/02/2013    Procedure: LEFT FEMORAL EMBOLECTOMY, LEFT FEMORAL ARTERY ENDARTERECTOMY WITH DACRON PATCH ANGIOPLASTY.;  Surgeon: Mal Misty, MD;  Location: [place];  Service: Vascular;  Laterality: Left;  . Transurethral resection of bladder tumor with gyrus (turbt-gyrus) N/A 01/11/2014    Procedure: TRANSURETHRAL RESECTION OF BLADDER TUMOR WITH GYRUS (TURBT-GYRUS);  Surgeon: Alexis Frock, MD;  [facility];  Service: Urology;  Laterality: N/A;  . Cystoscopy with ureteroscopy and stent placement Right 01/11/2014    Procedure: CYSTOSCOPY WITH URETEROSCOPY ,RIGHT RETROGRADE AND STENT CHANGE AND LASER OF URETERAL TUMOR;  Surgeon: Alexis Frock, MD;  [facility];  Service: Urology;  Laterality: Right;  . Holmium laser application Right 04/12/8126    Procedure: HOLMIUM LASER APPLICATION;  Surgeon: Alexis Frock, MD;  [facility];  Service: Urology;  Laterality: Right;  . Tee without cardioversion N/A 05/16/2014    Procedure: TRANSESOPHAGEAL ECHOCARDIOGRAM (TEE);  Surgeon: Josue Hector, MD;  Location: [facility] ENDOSCOPY;  Service: Cardiovascular;  Laterality: N/A;  . Cardioversion N/A 05/16/2014    Procedure: CARDIOVERSION;  Surgeon: Josue Hector, MD;  Location: [facility] ENDOSCOPY;  Service: Cardiovascular;  Laterality: N/A;  . Cardiac catheterization      11-02-13  . Cystoscopy/retrograde/ureteroscopy Right 08/23/2014    Procedure: CYSTOSCOPY/RETROGRADE/ DIAGNOSTIC  URETEROSCOPY/RIGHT RENAL STONE EXTRACTION;  Surgeon: Alexis Frock, MD;  [facility];  Service: Urology;  Laterality: Right;  . Cardioversion N/A 11/08/2014    Procedure: CARDIOVERSION;  Surgeon: Fay Records, MD;  Location: [facility] ENDOSCOPY;  Service: Cardiovascular;  Laterality: N/A;    History  Smoking status  . Former Smoker -- 2.00 packs/day for 50 years  . Types: Cigarettes  . Quit date: 12/08/2006  Smokeless tobacco  . Never Used    History  Alcohol Use No    Comment: quit 3 years ago    Family History  Problem Relation Age of Onset  . Liver cancer Mother     deceased age 33  . Cancer Mother     liver cancer  . Colon cancer Neg Hx   . Heart attack Father     Review of Systems: The review of systems is per the HPI.  All other systems were reviewed and are negative.  Physical Exam: BP 100/58 mmHg  Pulse 77  Ht 5\' 10"  (1.778 m)  Wt 309 lb 1.9 oz (140.216 kg)  BMI 44.35 kg/m2  BP is 120/60 by me with a large cuff.  Patient is very pleasant and in no acute distress. He remains obese but has lost 4 pounds. Skin is warm and dry. Color is normal.  HEENT is unremarkable. Normocephalic/atraumatic. PERRL. Sclera are nonicteric. Neck is supple. No masses. No JVD. Lungs with decreased breath sounds. Cardiac exam shows his heart tones to be very distant.  Abdomen is soft. Extremities are without edema. Gait and ROM are intact. He is using a cane today. No gross neurologic deficits noted.  Wt Readings from Last 3 Encounters:  11/22/14 309 lb 1.9 oz (140.216 kg)  11/21/14 313 lb (141.976 kg)  11/06/14 313 lb 6.4 oz (142.157 kg)    LABORATORY DATA/PROCEDURES:  EKG today with lots of artifact (not able to lie on table for his EKG) - looks to be sinus.   Device interrogated -  showing A pacing and V sensing.   Lab Results  Component Value Date   WBC 12.0* 11/06/2014   HGB 13.8 11/06/2014   HCT 42.2 11/06/2014   PLT 240.0 11/06/2014   GLUCOSE 273* 11/06/2014   CHOL 123  08/09/2014   TRIG * 08/09/2014    470.0 Triglyceride is over 400; calculations on Lipids are invalid.   HDL 25.80* 08/09/2014   LDLDIRECT 58.9 08/09/2014   LDLCALC  05/07/2008    UNABLE TO CALCULATE IF TRIGLYCERIDE OVER 400 mg/dL        Total Cholesterol/HDL:CHD Risk Coronary Heart Disease Risk Table                     Men   Women  1/2 Average Risk   3.4   3.3   ALT 51 08/09/2014   AST 58* 08/09/2014   NA 135 11/06/2014   K 3.8 11/06/2014   CL 97 11/06/2014   CREATININE 1.7* 11/06/2014   BUN 34* 11/06/2014   CO2 23 11/06/2014   TSH 1.20 08/09/2014   PSA 0.61 08/09/2014   INR 1.2* 11/06/2014   HGBA1C 8.6* 11/21/2014   MICROALBUR 170.0 Repeated and verified X2.* 06/27/2013    BNP (last 3 results)  Recent Labs  05/13/14 1338 05/17/14 0540 06/07/14 1402  PROBNP 2142.0* 590.6* 77.0   Cardioversion  Patient anesthetized by anesthesia with Propol With pads in AP position patient cardioverted to SR with 200 J synchronized biphasic energy To SR  Procedure without complication Pacer interrogation done.    Echo Study Conclusions from June 2015  - Left ventricle: Wall thickness was increased in a pattern of severe LVH. The estimated ejection fraction was 55%. - Aortic valve: There was very mild stenosis. - Mitral valve: Mildly calcified annulus. There was mild regurgitation. - Left atrium: The atrium was dilated. No evidence of thrombus in the atrial cavity or appendage. - Right atrium: No evidence of thrombus in the atrial cavity or appendage. - Atrial septum: No defect or patent foramen ovale was identified. Echo contrast study showed no right-to-left atrial level shunt, following an increase in RA pressure induced by provocative maneuvers. - Tricuspid valve: No evidence of vegetation. - Pulmonic valve: No evidence of vegetation. - Impressions: No LAA thrombus Proceeded with succesful [place] under propofol anesthesia.  Impressions:  - No LAA thrombus Proceeded  with succesful [facility] under propofol anesthesia.    Assessment / Plan: 1. PAF - s/p repeat cardioversion (2nd this year) - looks to be holding in sinus at the present time - referred to Dr. Rayann Heman to discuss AAD - if possible. He remains in sinus and is feeling better clinically.   2. Chronic diastolic HF -stable.   3. Morbid obesity  His overall situation is quite tenuous at best. Glad he is feeling better. Need to try and keep him in sinus if possible.  Patient is agreeable to this plan and will call if any problems develop in the interim.   Burtis Junes, RN, [place] [address] [place], [place]  [phone]

## 2014-12-15 ENCOUNTER — Other Ambulatory Visit: Payer: Self-pay | Admitting: Endocrinology

## 2014-12-15 NOTE — Telephone Encounter (Signed)
Please advise if ok to refill rx. Medication is under historical provider. Thanks!

## 2014-12-18 ENCOUNTER — Telehealth: Payer: Self-pay | Admitting: Pulmonary Disease

## 2014-12-18 MED ORDER — ALBUTEROL SULFATE HFA 108 (90 BASE) MCG/ACT IN AERS: 2.0000 | INHALATION_SPRAY | Freq: Four times a day (QID) | RESPIRATORY_TRACT | Status: DC | PRN

## 2014-12-18 NOTE — Telephone Encounter (Signed)
Pt states he spoke with [place] last month at visit about changing rescue inhaler to Ventolin HFA due to insurance changes.   Pharmacy: [facility].    Pt aware that I will send new Rx and update EPIC med list as well. Nothing more needed at this time.

## 2014-12-29 ENCOUNTER — Encounter: Payer: Commercial Managed Care - HMO | Admitting: Internal Medicine

## 2015-01-01 ENCOUNTER — Encounter: Payer: Self-pay | Admitting: Internal Medicine

## 2015-01-01 ENCOUNTER — Ambulatory Visit (INDEPENDENT_AMBULATORY_CARE_PROVIDER_SITE_OTHER): Payer: Commercial Managed Care - HMO | Admitting: *Deleted

## 2015-01-01 DIAGNOSIS — I495 Sick sinus syndrome: Secondary | ICD-10-CM | POA: Diagnosis not present

## 2015-01-01 NOTE — Progress Notes (Signed)
Remote pacemaker transmission.   

## 2015-01-03 ENCOUNTER — Encounter: Payer: Self-pay | Admitting: Family Medicine

## 2015-01-03 ENCOUNTER — Other Ambulatory Visit (INDEPENDENT_AMBULATORY_CARE_PROVIDER_SITE_OTHER): Payer: Commercial Managed Care - HMO

## 2015-01-03 ENCOUNTER — Ambulatory Visit (INDEPENDENT_AMBULATORY_CARE_PROVIDER_SITE_OTHER): Payer: Commercial Managed Care - HMO | Admitting: Family Medicine

## 2015-01-03 VITALS — BP 116/68 | HR 82 | Ht 70.0 in | Wt 318.0 lb

## 2015-01-03 DIAGNOSIS — M25531 Pain in right wrist: Secondary | ICD-10-CM | POA: Diagnosis not present

## 2015-01-03 DIAGNOSIS — M6530 Trigger finger, unspecified finger: Secondary | ICD-10-CM

## 2015-01-03 DIAGNOSIS — M654 Radial styloid tenosynovitis [de Quervain]: Secondary | ICD-10-CM | POA: Diagnosis not present

## 2015-01-03 NOTE — Assessment & Plan Note (Signed)
Patient is responding significant better after the injection. Patient was given a brace with a thumb spica to allow for rest over the course the next several weeks. We discussed home exercises and patient did work with a Surveyor, minerals today. We discussed an icing regimen as well as the possibility some topical anti-inflammatories. Patient does have many other comorbidities that it can make this somewhat difficult. Patient knows to watch his blood sugars over the course the next 72 hours. Patient will come back and see me again in 3 weeks to make sure he continues to improve. If he continues to have pain I would consider an injection in the [facility] joint.

## 2015-01-03 NOTE — Assessment & Plan Note (Signed)
Injected right ring finger today under ultrasound guidance. Patient tolerated the procedure well. Patient will do bracing for the next 72 hours and then some home exercises. We discussed icing protocol. Patient come back in 2-3 weeks for further evaluation.

## 2015-01-03 NOTE — Progress Notes (Signed)
[facility] [address], [place] [address] Phone: [phone] Subjective:     CC: finger pain  [id number] [name] is a 74 y.o. male coming in with complaint of trigger finger. Patient was seen 6 months ago for trigger finger. Patient was given a splints, icing protocol and home exercises. Patient states history [name] his finger did not improve. Patient had many other health related issues he needed take care of first before he came back here. Patient states that it continues suturing he would like to potentially try an injection.  In addition to this patient is also complaining of right thumb pain. States it is worse with certain movements. Patient states that he can give him a shooting pain going up his forearm. Denies any associated pain in the shoulder or neck. Patient denies any numbness. Denies any noticing any significant swelling. Rates the severity of 8 out of 10 can wake him up at night.    Past medical history, social, surgical and family history all reviewed in electronic medical record.   Review of Systems: No headache, visual changes, nausea, vomiting, diarrhea, constipation, dizziness, abdominal pain, skin rash, fevers, chills, night sweats, weight loss, swollen lymph nodes, body aches, joint swelling, muscle aches, chest pain, shortness of breath, mood changes.   Objective Blood pressure 116/68, pulse 82, height 5\' 10"  (1.778 m), weight 318 lb (144.244 kg), SpO2 98 %.  General: No apparent distress alert and oriented x3 mood and affect normal, dressed appropriately. Obese.  HEENT: Pupils equal, extraocular movements intact  Respiratory: Patient's speak in full sentences does have some mild shortness of breath at rest. Patient states that this is his baseline. Cardiovascular: +1 pitting edema of the large obese bilaterally no erythema noted Skin: Warm dry intact with no signs of infection or rash on extremities or on axial  skeleton.  Abdomen: Soft nontender obese Neuro: Cranial nerves II through XII are intact, neurovascularly intact in all extremities with 2+ DTRs and 2+ pulses.  Lymph: No lymphadenopathy of posterior or anterior cervical chain or axillae bilaterally.  Gait normal with good balance and coordination.  MSK:  Non tender with full range of motion and good stability and symmetric strength and tone of shoulders, elbows, wrist, hip, knee and ankles bilaterally. Difficult to assess secondary to patient's body habitus.  The hand exam on the right side shows the patient does have a nodule at the A2 pulley that is palpated on the palmar aspect of the hand of the ring finger. No triggering occurring at this time. Patient is also having a positive Finkelstein's of the thumb. Mild positive grind test. Neurovascularly intact distally with good capillary refill. Contralateral hand unremarkable.  Limited muscular skeletal ultrasound was performed and interpreted by Hulan Saas, M today. Ultrasound showed patient does have a nodule as well as hypoechoic changes within the tendon sheath just proximal to the A2 pulley. No tear appreciated in the tendon. Impression:  Trigger finger  MSK US performed of: Right wrist This study was ordered, performed, and interpreted by Charlann Boxer D.O.  Wrist: First compartment shows significant swelling. Patient has significant hypoechoic changes within the tendon sheath of the abductor pollicis longus tendon. No effusion seen. TFCC intact. Scapholunate ligament intact. Carpal tunnel visualized and median nerve area normal, flexor tendons all normal in appearance without fraying, tears, or sheath effusions. Power doppler signal normal.  IMPRESSION:  De Quervain's tenosynovitis    Procedure: Real-time Ultrasound Guided Injection of abductor pollicis  longus tendon sheath and fourth trigger finger Device: GE Logiq E  Ultrasound guided injection is preferred based studies that  show increased duration, increased effect, greater accuracy, decreased procedural pain, increased response rate, and decreased cost with ultrasound guided versus blind injection.  Verbal informed consent obtained.  Time-out conducted.  Noted no overlying erythema, induration, or other signs of local infection.  Skin prepped in a sterile fashion.  Local anesthesia: Topical Ethyl chloride.  With sterile technique and under real time ultrasound guidance:  Patient had 2 separate injections. One injection was within the abductor pollicis longus tendon sheath. Pictures were saved. Patient had 0.5 mL of 0.5% Marcaine and 0.5 mL of Kenalog 40 mg/dL. Second injection was within the trigger nodule that was present in the fourth finger at the A2 pulley. Picture saved. Patient had 0.5 mL of 0.5% Marcaine and 0.5 mL of Kenalog 41 g/dL. Completed without difficulty  Pain immediately resolved suggesting accurate placement of the medication.  Advised to call if fevers/chills, erythema, induration, drainage, or persistent bleeding.  Images permanently stored and available for review in the ultrasound unit.  Impression: Technically successful ultrasound guided injection.  Procedure note [id number]; 15 minutes spent for Therapeutic exercises as stated in above notes.  This included exercises focusing on stretching, strengthening, with significant focus on eccentric aspects.   Proper technique shown and discussed handout in great detail with ATC.  All questions were discussed and answered.      Impression and Recommendations:     This case required medical decision making of moderate complexity.

## 2015-01-03 NOTE — Patient Instructions (Addendum)
Great to see you again.  Ice is your friend. 10 minutes at night and after activity can help 2 injections today Wear new brace day and night for next week then nightly for 2 weeks.  Exercises 3 times a week.  See me again in 4 weeks.

## 2015-01-03 NOTE — Progress Notes (Signed)
Pre visit review using our clinic review tool, if applicable. No additional management support is needed unless otherwise documented below in the visit note. 

## 2015-01-04 LAB — MDC_IDC_ENUM_SESS_TYPE_REMOTE
Battery Remaining Longevity: 78 mo
Brady Statistic AP VP Percent: 9.7 %
Brady Statistic AP VS Percent: 67 %
Brady Statistic AS VS Percent: 1 %
Brady Statistic RA Percent Paced: 75 %
Brady Statistic RV Percent Paced: 33 %
Implantable Pulse Generator Model: 2210
Lead Channel Impedance Value: 380 Ohm
Lead Channel Impedance Value: 380 Ohm
Lead Channel Pacing Threshold Amplitude: 0.75 V
Lead Channel Sensing Intrinsic Amplitude: 3.3 mV
Lead Channel Sensing Intrinsic Amplitude: 6.9 mV
Lead Channel Setting Pacing Amplitude: 2 V
Lead Channel Setting Pacing Pulse Width: 0.5 ms
Lead Channel Setting Sensing Sensitivity: 2 mV
MDC IDC MSMT BATTERY REMAINING PERCENTAGE: 74 %
MDC IDC MSMT BATTERY VOLTAGE: 2.95 V
MDC IDC MSMT LEADCHNL RV PACING THRESHOLD PULSEWIDTH: 0.5 ms
MDC IDC PG SERIAL: 7152830
MDC IDC SESS DTM: 20160125081516
MDC IDC SET LEADCHNL RV PACING AMPLITUDE: 1 V
MDC IDC STAT BRADY AS VP PERCENT: 23 %

## 2015-01-10 ENCOUNTER — Encounter: Payer: Self-pay | Admitting: Cardiology

## 2015-01-11 ENCOUNTER — Ambulatory Visit: Payer: Self-pay | Admitting: General Practice

## 2015-01-17 ENCOUNTER — Other Ambulatory Visit: Payer: Self-pay | Admitting: Endocrinology

## 2015-01-17 NOTE — Telephone Encounter (Signed)
Please advise if ok to refill rx. Medication is listed under a historical provider. Thanks! 

## 2015-01-30 ENCOUNTER — Ambulatory Visit (INDEPENDENT_AMBULATORY_CARE_PROVIDER_SITE_OTHER): Payer: Commercial Managed Care - HMO | Admitting: Family Medicine

## 2015-01-30 ENCOUNTER — Ambulatory Visit (INDEPENDENT_AMBULATORY_CARE_PROVIDER_SITE_OTHER)
Admission: RE | Admit: 2015-01-30 | Discharge: 2015-01-30 | Disposition: A | Discharge status: Home / Self Care | Payer: Commercial Managed Care - HMO | Source: Ambulatory Visit | Attending: Family Medicine | Admitting: Family Medicine

## 2015-01-30 ENCOUNTER — Encounter: Payer: Self-pay | Admitting: Family Medicine

## 2015-01-30 VITALS — BP 132/64 | HR 79 | Ht 70.0 in | Wt 317.0 lb

## 2015-01-30 DIAGNOSIS — M25531 Pain in right wrist: Secondary | ICD-10-CM

## 2015-01-30 DIAGNOSIS — M25539 Pain in unspecified wrist: Secondary | ICD-10-CM | POA: Insufficient documentation

## 2015-01-30 NOTE — Progress Notes (Signed)
  [facility] [address], [place] [address] Phone: [phone] Subjective:     CC: thumb pain follow up  [id number] Oday JAARON OLESON is a 74 y.o. male coming in with complaint of thumb pain. Patient was previously seen and did have more of a de Quervain's tenosynovitis. Patient was given an injection. 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 Patient states he was doing the home exercises, bracing, as well as icing protocol. Patient states that he was doing significantly better. Patient states that unfortunately impressively one week ago he did have to catch himself. Since then he is certainly having worsening pain again around the thumb area. Patient states that it is more of a dull throbbing pain and hurts with rotation. Patient denies any numbness or tingling.     Past medical history, social, surgical and family history all reviewed in electronic medical record.   Review of Systems: No headache, visual changes, nausea, vomiting, diarrhea, constipation, dizziness, abdominal pain, skin rash, fevers, chills, night sweats, weight loss, swollen lymph nodes, body aches, joint swelling, muscle aches, chest pain, shortness of breath, mood changes.   Objective Blood pressure 132/64, pulse 79, height 5\' 10"  (1.778 m), weight 317 lb (143.79 kg), SpO2 99 %.  General: No apparent distress alert and oriented x3 mood and affect normal, dressed appropriately. Obese.  HEENT: Pupils equal, extraocular movements intact  Respiratory: Patient's speak in full sentences does have some mild shortness of breath at rest. Patient states that this is his baseline. Cardiovascular: +1 pitting edema of the large obese bilaterally no erythema noted Skin: Warm dry intact with no signs of infection or rash on extremities or on axial skeleton.  Abdomen: Soft nontender obese Neuro: Cranial nerves II through XII are intact, neurovascularly intact in all extremities with 2+ DTRs and 2+  pulses.  Lymph: No lymphadenopathy of posterior or anterior cervical chain or axillae bilaterally.  Gait normal with good balance and coordination.  MSK:  Non tender with full range of motion and good stability and symmetric strength and tone of shoulders, elbows, wrist, hip, knee and ankles bilaterally. Difficult to assess secondary to patient's body habitus.  Wrist: Right Inspection normal with no visible erythema or swelling. ROM smooth and normal with good flexion and extension a bit ulnar and radial deviation does give pain on the radial aspect of the wrist. Palpation is normal over metacarpals, navicular, lunate, and TFCC; tendons without tenderness/ swelling Positive snuffbox tenderness. No tenderness over Canal of [place]. Strength 5/5 in all directions without pain. Negative Finkelstein, tinel's and phalens. Negative Watson's test.   Limited muscular skeletal ultrasound was performed and interpreted by Hulan Saas, M  Limited ultrasound shows the patient does have some any abnormality noted scaphoid bone. This could be a cortical defect.           Impression and Recommendations:     This case required medical decision making of moderate complexity.

## 2015-01-30 NOTE — Assessment & Plan Note (Signed)
Patient's wrist pain is likely unfortunate secondary to a scaphoid fracture. X-rays are pending. Patient though is severely tender to palpation over the scaphoid bone. I do believe the patient is likely going to have mid or distal scaphoid fracture that seems to be new and acute which is different than the pain he is having previously. I believe the patient's tendinitis is improved at this time. Patient was put in a cast formation. Patient will come back in 3 weeks.

## 2015-01-30 NOTE — Patient Instructions (Addendum)
Good to see you I think you have a fracture in the scaphoid bone.  You need to wear the brace for next 3 weeks.  Sorry Vitamin D is essential See me in 3 weeks and we will hopefully get you doing exercises again.

## 2015-02-07 DIAGNOSIS — H4011X3 Primary open-angle glaucoma, severe stage: Secondary | ICD-10-CM | POA: Diagnosis not present

## 2015-02-09 DIAGNOSIS — H26492 Other secondary cataract, left eye: Secondary | ICD-10-CM | POA: Diagnosis not present

## 2015-02-09 DIAGNOSIS — H43392 Other vitreous opacities, left eye: Secondary | ICD-10-CM | POA: Diagnosis not present

## 2015-02-09 DIAGNOSIS — H16149 Punctate keratitis, unspecified eye: Secondary | ICD-10-CM | POA: Diagnosis not present

## 2015-02-09 IMAGING — RF DG ABDOMEN 1V
1 series · 8 of 8 positions shown · non-contrast
Comparison: CT abdomen and pelvis -10/01/2013

FLUOROSCOPY TIME:  30 seconds

CLINICAL DATA: Bilateral retrograde pyelogram with right-sided
ureteral stent placement

EXAM:
DG C-ARM 1-60 MIN - NRPT [facility]; ABDOMEN - 1 VIEW

[Series 1: run · 8 of 8 slices shown]
[im 1/8]
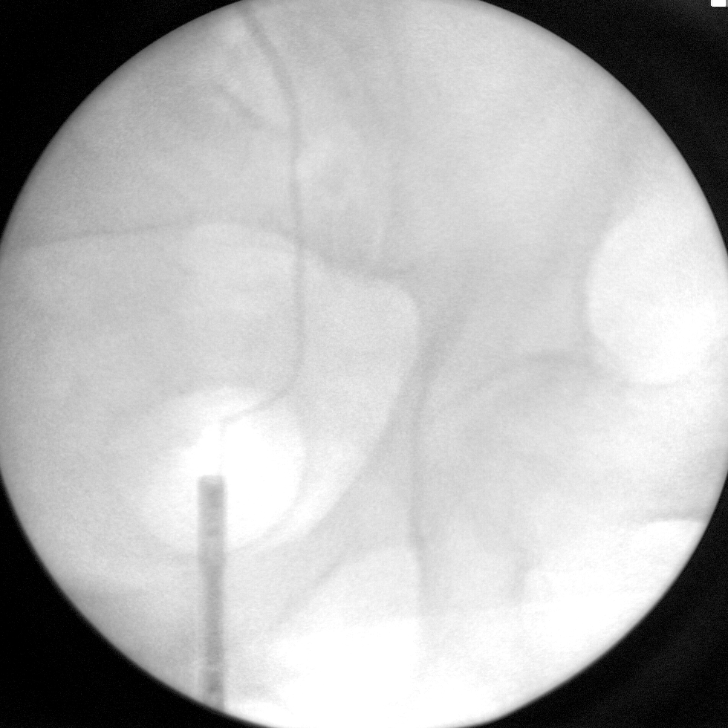
[im 2/8]
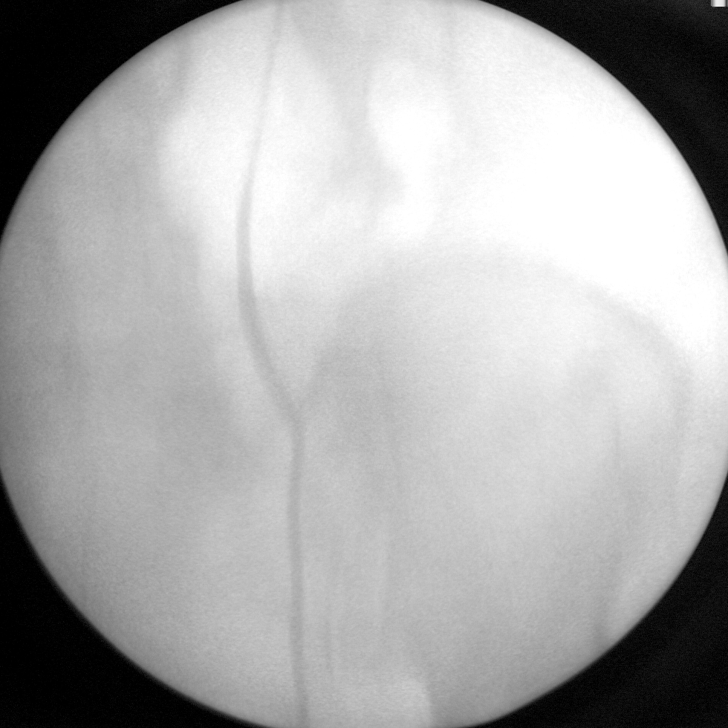
[im 3/8]
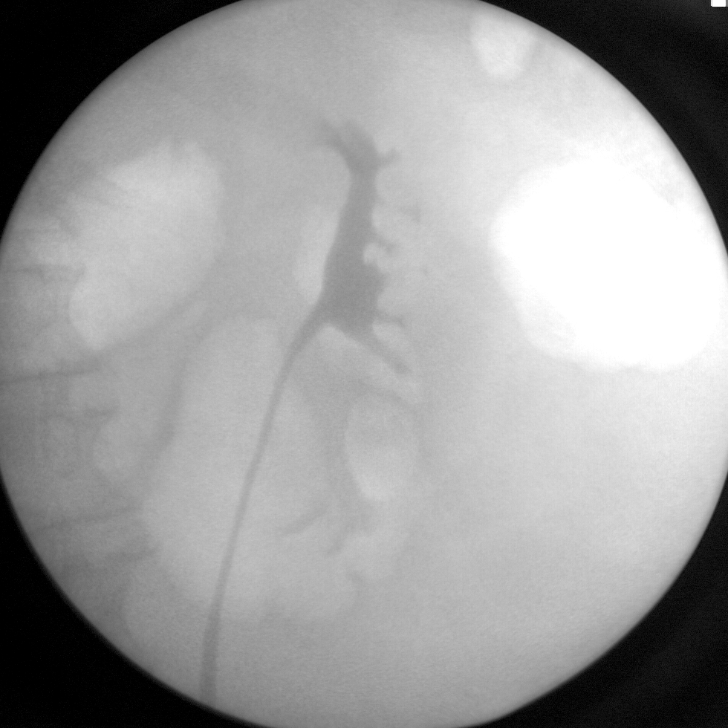
[im 4/8]
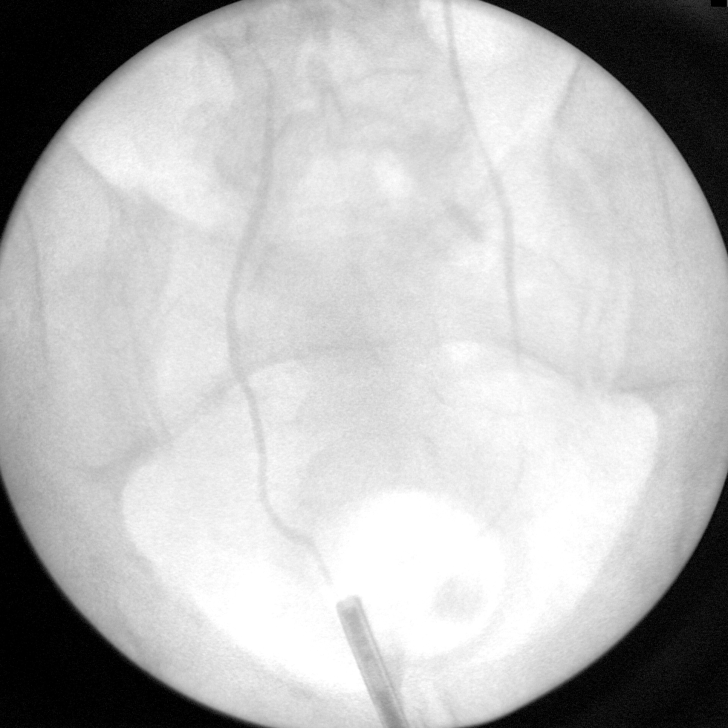
[im 5/8]
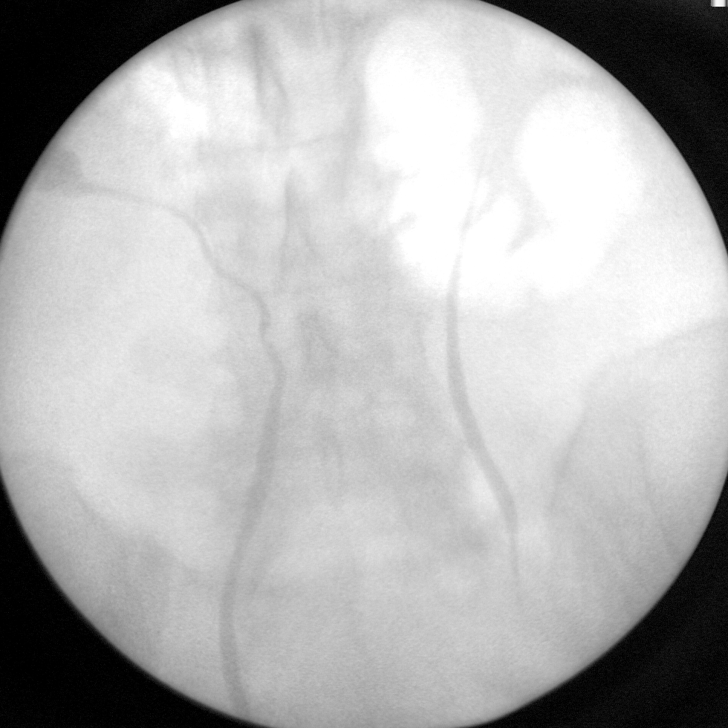
[im 6/8]
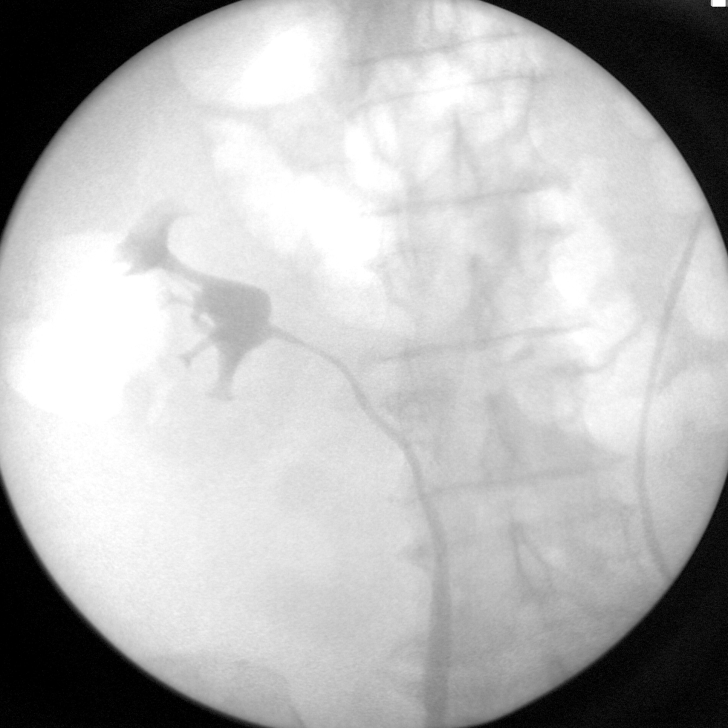
[im 7/8]
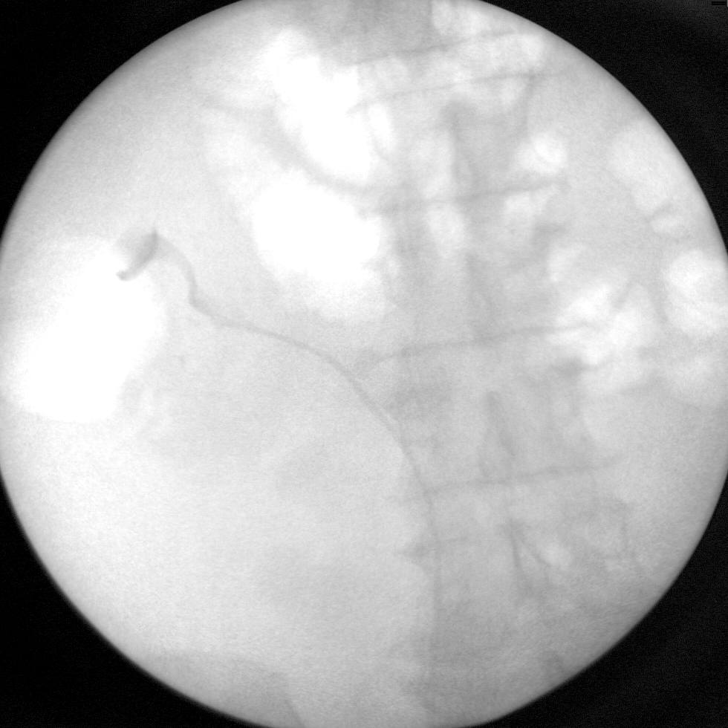
[im 8/8]
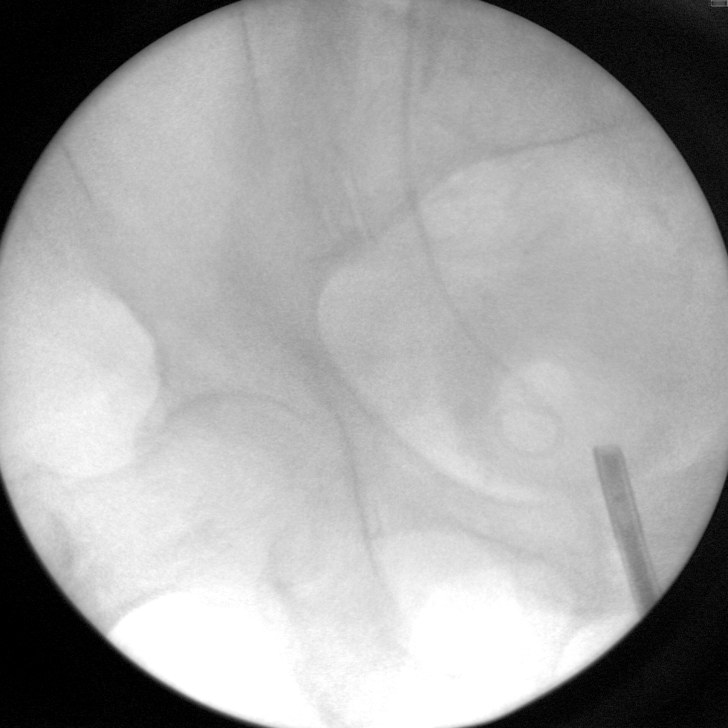

[8 of 8 positions shown; findings below may reference images not displayed]

FINDINGS: Eight spot intraoperative fluoroscopic images during bilateral
retrograde pyelogram and right-sided double-J ureteral stent
placement are provided for review.

Images demonstrate sequential selective cannulation and
opacification of the bilateral ureters. No discrete filling defects
are seen along the opacified course of either ureter or collecting
system. No evidence of hydronephrosis.

Subsequent images demonstrate placement of a right-sided double-J
ureteral stent with superior end uncoiled within the right renal
pelvis and inferior end coiled overlying the expected location of
the urinary bladder.
IMPRESSION: Bilateral retrograde pyelogram and right-sided ureteral stent
placement as above. No discrete filling defects are seen along the
opacified course of either ureter or collecting system. Further
evaluation could be performed with dual phase renal CT as clinically
indicated.

## 2015-02-10 IMAGING — CR DG CHEST 2V
3 series · 3 of 3 positions shown · non-contrast
Comparison: 10/24/2013.

CLINICAL DATA: Hypoxia with weakness and shortness of breath.

EXAM:
CHEST  2 VIEW

[w chest lat (1 of 2)]
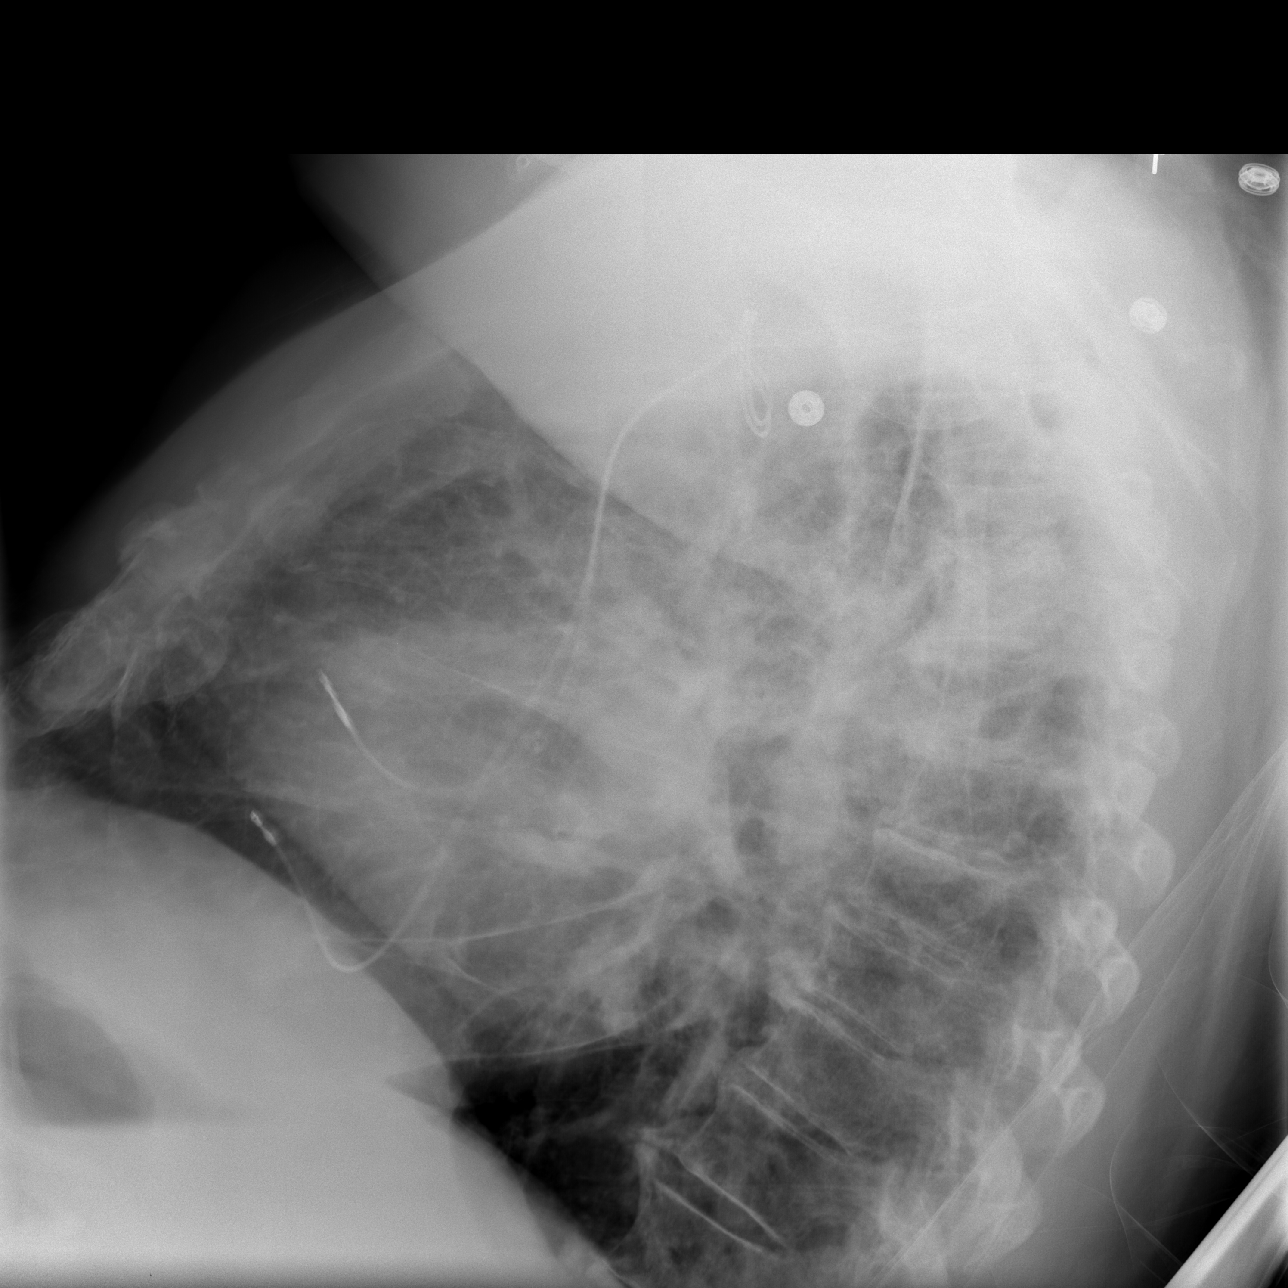

[w chest lat (2 of 2)]
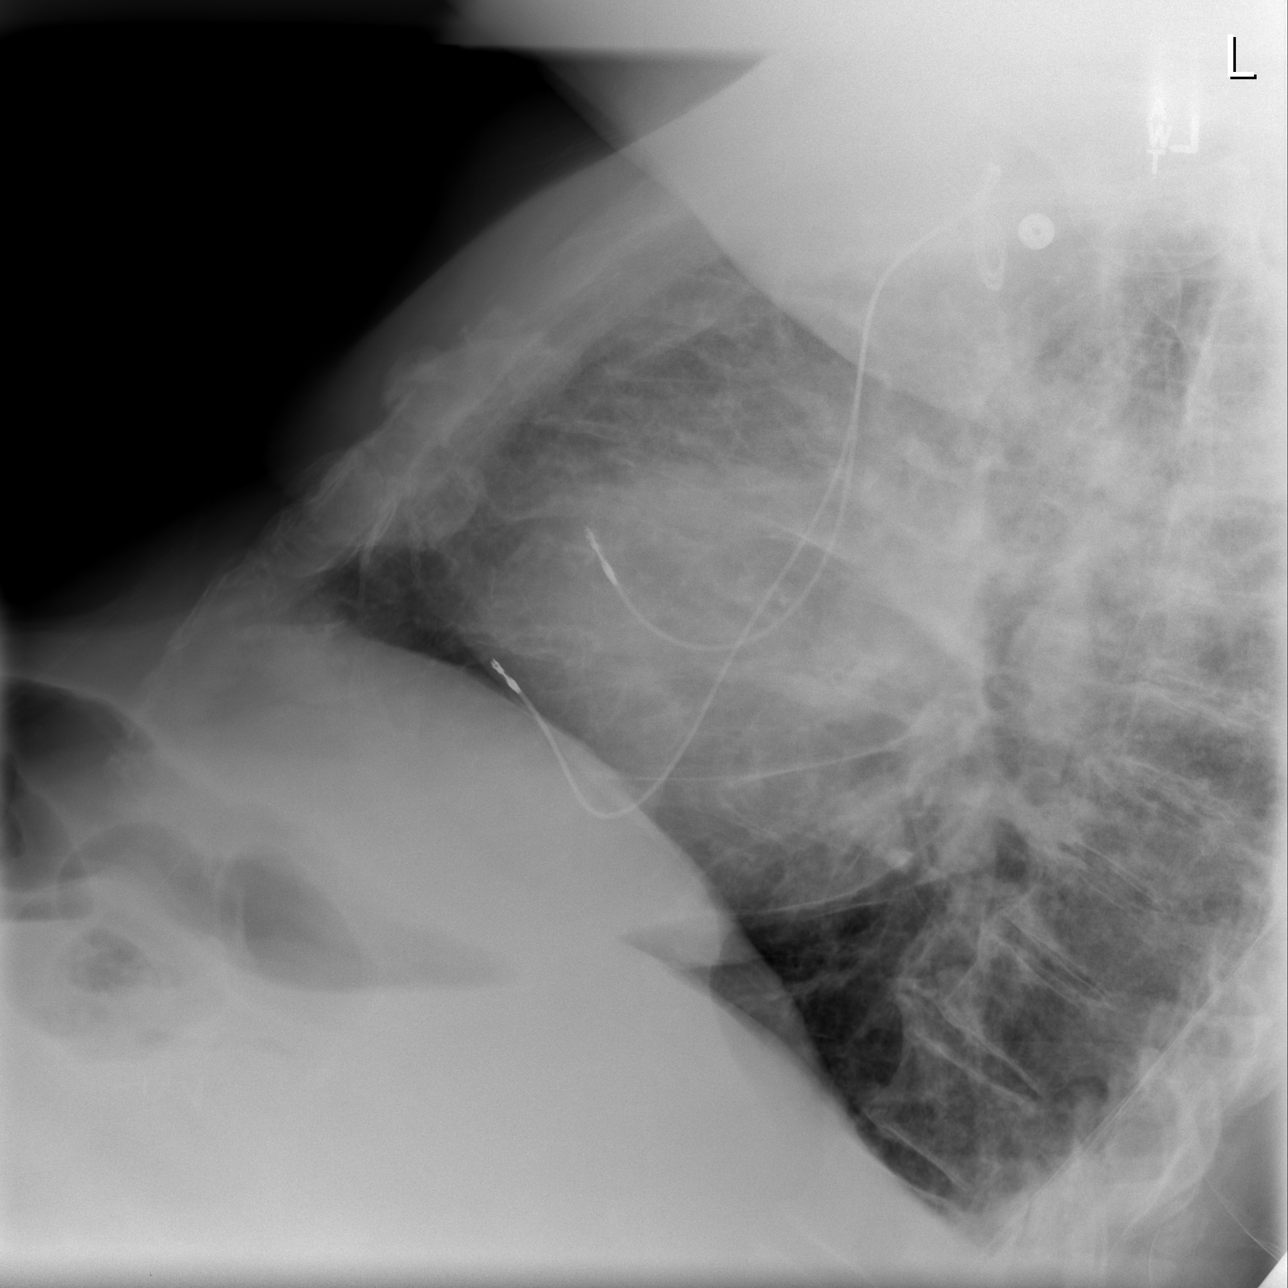

[view not recorded]
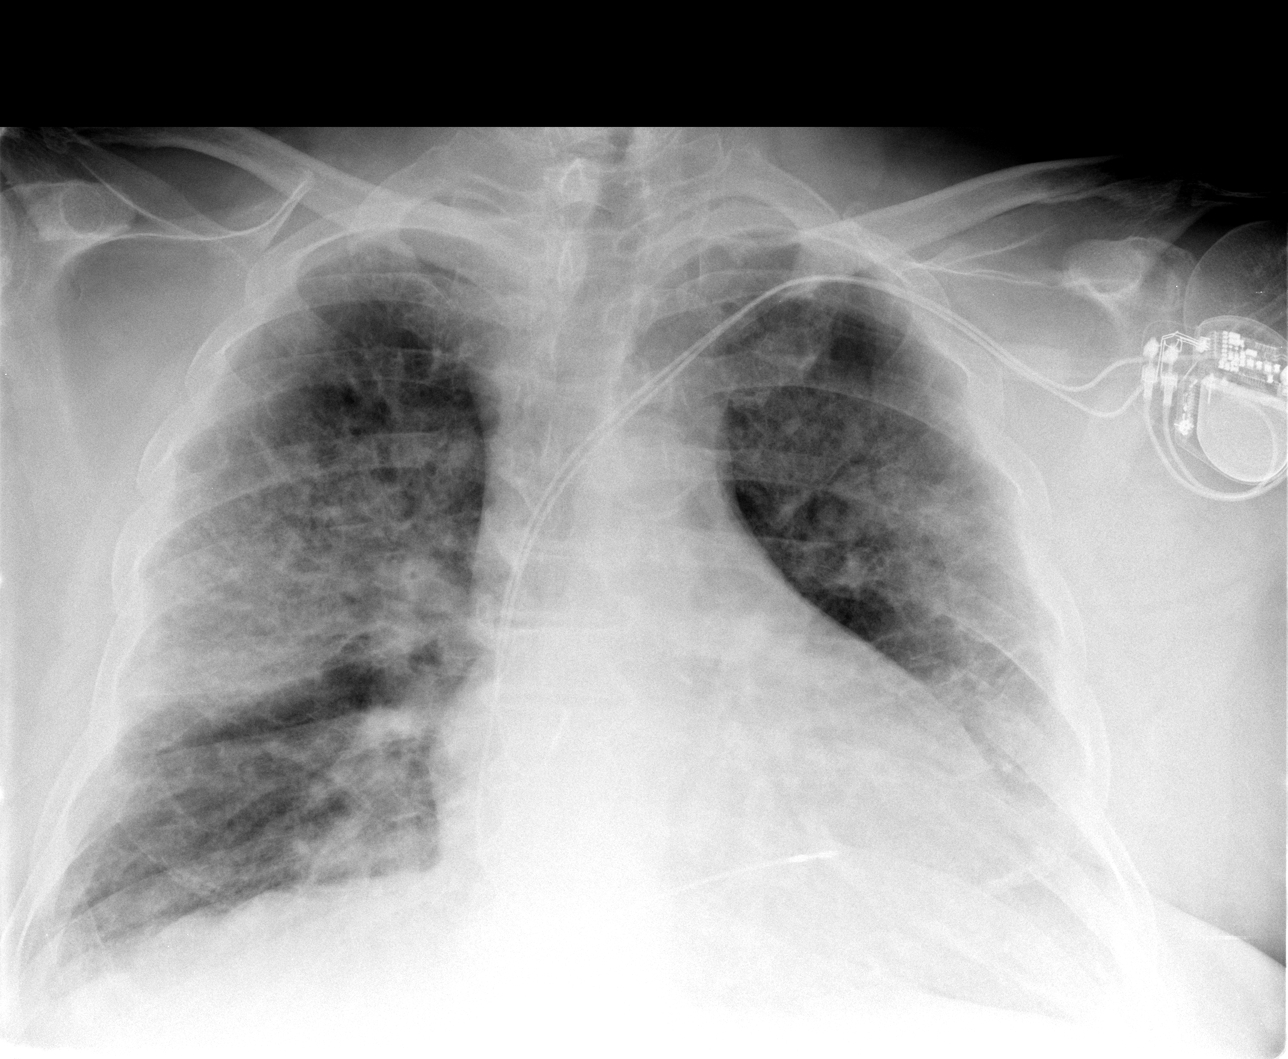

[3 of 3 positions shown; findings below may reference images not displayed]

FINDINGS: Cardiac enlargement. Satisfactory position of Dual lead pacer.
Interstitial and alveolar prominence consistent with congestive
heart failure. Bilateral pneumonia less favored. Calcified tortuous
aorta. Worsening aeration from priors. Small bilateral effusions.
IMPRESSION: Cardiac enlargement with interstitial and alveolar prominence
consistent with congestive heart failure. Worsening aeration from
priors.

## 2015-02-15 DIAGNOSIS — H4011X3 Primary open-angle glaucoma, severe stage: Secondary | ICD-10-CM | POA: Diagnosis not present

## 2015-02-20 ENCOUNTER — Ambulatory Visit (INDEPENDENT_AMBULATORY_CARE_PROVIDER_SITE_OTHER): Payer: Commercial Managed Care - HMO | Admitting: Endocrinology

## 2015-02-20 ENCOUNTER — Other Ambulatory Visit: Payer: Self-pay | Admitting: Endocrinology

## 2015-02-20 ENCOUNTER — Other Ambulatory Visit: Payer: Self-pay

## 2015-02-20 ENCOUNTER — Encounter: Payer: Self-pay | Admitting: Endocrinology

## 2015-02-20 VITALS — BP 118/60 | HR 81 | Temp 98.2°F | Ht 70.0 in | Wt 312.0 lb

## 2015-02-20 DIAGNOSIS — E1029 Type 1 diabetes mellitus with other diabetic kidney complication: Secondary | ICD-10-CM

## 2015-02-20 DIAGNOSIS — IMO0002 Reserved for concepts with insufficient information to code with codable children: Secondary | ICD-10-CM

## 2015-02-20 DIAGNOSIS — E1065 Type 1 diabetes mellitus with hyperglycemia: Principal | ICD-10-CM

## 2015-02-20 LAB — BASIC METABOLIC PANEL
BUN: 34 mg/dL — AB (ref 6–23)
CHLORIDE: 99 meq/L (ref 96–112)
CO2: 25 meq/L (ref 19–32)
CREATININE: 1.62 mg/dL — AB (ref 0.40–1.50)
Calcium: 9.4 mg/dL (ref 8.4–10.5)
GFR: 44.76 mL/min — ABNORMAL LOW (ref >60.00)
Glucose, Bld: 393 mg/dL — ABNORMAL HIGH (ref 70–99)
POTASSIUM: 4.3 meq/L (ref 3.5–5.1)
SODIUM: 134 meq/L — AB (ref 135–145)

## 2015-02-20 LAB — HEMOGLOBIN A1C: HEMOGLOBIN A1C: 8.5 % — AB (ref 4.6–6.5)

## 2015-02-20 MED ORDER — TRAMADOL HCL 50 MG PO TABS: 100.0000 mg | ORAL_TABLET | Freq: Two times a day (BID) | ORAL | Status: DC | PRN

## 2015-02-20 NOTE — Progress Notes (Signed)
Subjective:    Patient ID: [name], male    DOB: Feb 23, 1941, 74 y.o.   MRN: [id number]  HPI Pt returns for f/u of diabetes mellitus: DM type: Insulin-requiring type 2 Dx'ed: 0865 Complications: renal insufficiency, polyneuropathy, and CAD.   Therapy: insulin since 1989.  DKA: never Severe hypoglycemia: never Pancreatitis: several episodes, most recently in 2013 Other: he has severe insulin resistance; he takes multiple daily injections of U-500; variable cbg's have limited efforts at glycemic control.   Interval history: he brings a record of his cbg's which i have reviewed today.  It varies from 53-336, but most are in the 100's.  It is in general higher as the day goes on, but not necessarily so.   He does not know he fx his wrist.   Past Medical History  Diagnosis Date  . Diabetes mellitus   . COPD (chronic obstructive pulmonary disease)     CPAP  . Renal insufficiency   . Hyperlipemia   . Hypothyroidism   . Hyperkalemia   . 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 Sick sinus syndrome     s/p PPM by JA  . Pancreatitis   . Morbid obesity   . OSA on CPAP     using CPAP although it is hard for him to tolerate  . CAD (coronary artery disease)   . Hypertensive cardiovascular-renal disease   . Depression   . Anxiety   . H/O Legionnaire's disease 2003  . History of blood clots     R groin  . Peripheral neuropathy   . Osteoarthritis     fingers  . GERD (gastroesophageal reflux disease)   . Anemia     hx of  . Pneumonia 2003  . Cancer 2014    bladder cancer AND RIGHT URETERAL CANCER  . Diastolic dysfunction with chronic heart failure   . Persistent atrial fibrillation   . Atypical atrial flutter   . Venous insufficiency   . Complete heart block     cardioverted from A.Fib,hx. Sick sinus syndrome -Pacemaker implanted  . Pacemaker     implanted St. Jude 7'11  . CHF (congestive heart failure)   . Gait difficulty     slow gait"ambulates with walker"     Past Surgical History  Procedure Laterality Date  . Nasal septum surgery  1967  . Pacemaker placement  06/2010    [facility] Accent RF DR, Model [id number] ( Serial number [id number])  . Thromboembolectomy and four compartment fasciotomy  2009    GROIN AREA  . Cataract extraction Right   . Cholecystectomy  2012  . Insert / replace / remove pacemaker  2011  . Transurethral resection of bladder tumor with gyrus (turbt-gyrus) N/A 10/26/2013    Procedure: TRANSURETHRAL RESECTION OF BLADDER TUMOR WITH GYRUS (TURBT-GYRUS);  Surgeon: Alexis Frock, MD;  Location: [facility];  Service: Urology;  Laterality: N/A;  . Cystoscopy w/ ureteral stent placement Right 10/26/2013    Procedure: CYSTOSCOPY WITH RETROGRADE PYELOGRAM/URETERAL STENT PLACEMENT;  Surgeon: Alexis Frock, MD;  Location: [facility];  Service: Urology;  Laterality: Right;  . Embolectomy Left 11/02/2013    Procedure: LEFT FEMORAL EMBOLECTOMY, LEFT FEMORAL ARTERY ENDARTERECTOMY WITH DACRON PATCH ANGIOPLASTY.;  Surgeon: Mal Misty, MD;  Location: [place];  Service: Vascular;  Laterality: Left;  . Transurethral resection of bladder tumor with gyrus (turbt-gyrus) N/A 01/11/2014    Procedure: TRANSURETHRAL RESECTION OF BLADDER TUMOR WITH GYRUS (TURBT-GYRUS);  Surgeon: Alexis Frock, MD;  [facility]: [facility];  Service: Urology;  Laterality: N/A;  . Cystoscopy with ureteroscopy and stent placement Right 01/11/2014    Procedure: CYSTOSCOPY WITH URETEROSCOPY ,RIGHT RETROGRADE AND STENT CHANGE AND LASER OF URETERAL TUMOR;  Surgeon: Alexis Frock, MD;  [facility]: [facility];  Service: Urology;  Laterality: Right;  . Holmium laser application Right 08/12/7095    Procedure: HOLMIUM LASER APPLICATION;  Surgeon: Alexis Frock, MD;  [facility]: [facility];  Service: Urology;  Laterality: Right;  . Tee without cardioversion N/A 05/16/2014    Procedure: TRANSESOPHAGEAL ECHOCARDIOGRAM (TEE);  Surgeon: Josue Hector, MD;  Location: [facility] ENDOSCOPY;  Service: Cardiovascular;   Laterality: N/A;  . Cardioversion N/A 05/16/2014    Procedure: CARDIOVERSION;  Surgeon: Josue Hector, MD;  Location: [facility] ENDOSCOPY;  Service: Cardiovascular;  Laterality: N/A;  . Cardiac catheterization      11-02-13  . Cystoscopy/retrograde/ureteroscopy Right 08/23/2014    Procedure: CYSTOSCOPY/RETROGRADE/ DIAGNOSTIC URETEROSCOPY/RIGHT RENAL STONE EXTRACTION;  Surgeon: Alexis Frock, MD;  [facility];  Service: Urology;  Laterality: Right;  . Cardioversion N/A 11/08/2014    Procedure: CARDIOVERSION;  Surgeon: Fay Records, MD;  Location: [facility] ENDOSCOPY;  Service: Cardiovascular;  Laterality: N/A;    History   Social History  . Marital Status: Married    Spouse Name: N/A  . Number of Children: N/A  . 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 Smoking status: Former Smoker -- 2.00 packs/day for 50 years    Types: Cigarettes    Quit date: 12/08/2006  . Smokeless tobacco: Never Used  . Alcohol Use: No     Comment: quit 3 years ago  . Drug Use: No  . Sexual Activity: Not Currently   Other Topics Concern  . Not on file   Social History Narrative    Current Outpatient Prescriptions on File Prior to Visit  Medication Sig Dispense Refill  . acetaminophen (TYLENOL) 500 MG tablet Take 500 mg by mouth 2 (two) times daily as needed for moderate pain (pain).     Marland Kitchen albuterol (PROVENTIL HFA;VENTOLIN HFA) 108 (90 BASE) MCG/ACT inhaler Inhale 2 puffs into the lungs every 6 (six) hours as needed for wheezing or shortness of breath. 1 Inhaler 6  . ALPRAZolam (XANAX) 0.25 MG tablet Take 0.25 mg by mouth 3 (three) times daily as needed for anxiety.    Marland Kitchen apixaban (ELIQUIS) 5 MG TABS tablet Take 1 tablet (5 mg total) by mouth 2 (two) times daily. 180 tablet 3  . Ascorbic Acid (VITAMIN C) 500 MG tablet Take 500 mg by mouth daily.     . brimonidine (ALPHAGAN P) 0.1 % SOLN Place 1 drop into the left eye 3 (three) times daily.      . calcium carbonate (TUMS - DOSED IN MG ELEMENTAL CALCIUM) 500 MG chewable tablet Chew 2 tablets by mouth 2 (two) times daily as needed for indigestion or heartburn.    . cholecalciferol (VITAMIN D) 1000 UNITS tablet Take 1,000 Units by mouth daily.    . CRESTOR 40 MG tablet TAKE 1/2 TABLET EVERY EVENING 45 tablet 2  . dextromethorphan (DELSYM) 30 MG/5ML liquid Take 90 mg by mouth 2 (two) times daily as needed for cough.    . dorzolamide-timolol (COSOPT) 22.3-6.8 MG/ML ophthalmic solution Place 1 drop into both eyes 2 (two) times daily.     . fluticasone (FLONASE) 50 MCG/ACT nasal spray  Place 2 sprays into both nostrils daily as needed for allergies or rhinitis.    . furosemide (LASIX) 40 MG tablet Take one tab (40mg ) in the AM, and 1/2 tab (20mg ) in the PM 135 tablet 3  . guaiFENesin (MUCINEX) 600 MG 12 hr tablet Take by mouth as needed.    Marland Kitchen HUMULIN R 500 UNIT/ML SOLN injection DRAW UP 50 UNITS ON U100 SYRINGE (250 ACTUAL INSULIN UNITS) AND INJECT SUBCUTANEOUSLY 4 TIMES DAILY. 80 mL 2  . Lactobacillus (ACIDOPHILUS) CAPS capsule Take 1 capsule by mouth daily.    Marland Kitchen latanoprost (XALATAN) 0.005 % ophthalmic solution Place 1 drop into both eyes at bedtime.    Marland Kitchen levothyroxine (SYNTHROID, LEVOTHROID) 50 MCG tablet TAKE 1 TABLET EVERY DAY BEFORE BREAKFAST 90 tablet 3  . metoprolol tartrate (LOPRESSOR) 25 MG tablet Take 1 tablet (25 mg total) by mouth 3 (three) times daily. 270 tablet 3  . Multiple Vitamin (MULTIVITAMIN WITH MINERALS) TABS tablet Take 1 tablet by mouth daily.    . Omega-3 Fatty Acids (FISH OIL) 1000 MG CAPS Take 1,000 mg by mouth every morning.     Marland Kitchen omeprazole (PRILOSEC) 20 MG capsule Take 40 mg by mouth every morning.     Marland Kitchen omeprazole (PRILOSEC) 40 MG capsule TAKE 1 CAPSULE EVERY DAY 90 capsule 1  . senna-docusate (SENOKOT-S) 8.6-50 MG per tablet Take 1 tablet by mouth 2 (two) times daily. While taking pain meds to prevent constipation. 20 tablet 0  . venlafaxine XR (EFFEXOR-XR) 150  MG 24 hr capsule Take 150 mg by mouth daily as needed (anxiety.).    Marland Kitchen levothyroxine (SYNTHROID, LEVOTHROID) 150 MCG tablet Take 50 mcg by mouth every morning.      No current facility-administered medications on file prior to visit.    Allergies  Allergen Reactions  . Actos [Pioglitazone] Swelling    Family History  Problem Relation Age of Onset  . Liver cancer Mother     deceased age 42  . Cancer Mother     liver cancer  . Colon cancer Neg Hx   . Heart attack Father     BP 118/60 mmHg  Pulse 81  Temp(Src) 98.2 F (36.8 C) (Oral)  Ht 5\' 10"  (1.778 m)  Wt 312 lb (141.522 kg)  BMI 44.77 kg/m2  SpO2 95%   Review of Systems Denies LOC and weight change.      Objective:   Physical Exam VITAL SIGNS:  See vs page GENERAL: no distress Pulses: dorsalis pedis intact bilat.  EXTEMITIES: no deformity. no ulcer on the feet. feet are of normal color and temp. 1+ bilat leg edema. Several healed surgical scars at the right leg. There is very dry skin andhyperpigmentation of the legs.  Neuro: sensation is intact to touch on the feet, but decreased from normal. Ext: There is severe bilateral onychomycosis of the toenails  Lab Results  Component Value Date   PTH 43 02/20/2015   CALCIUM 9.4 02/20/2015   CALCIUM 9.1 02/20/2015   CAION 1.15 11/01/2013   Lab Results  Component Value Date   HGBA1C 8.5* 02/20/2015      Assessment & Plan:  Onychomycosis, persistent.  He is at risk for secondary paronychial infection. i advised lamisil, but pt declines  DM: therapy limited by variable cbg's.  Please continue the same insulin.  Renal failure: stable:  We'll follow.  Bony fracture: no evidence of secondary hyperparathyroidism.    Subjective:   Patient here for Medicare annual wellness visit and management of  other chronic and acute problems.     Risk factors: advanced age    27 of Physicians Providing Medical Care to Patient:  See "snapshot"   Activities of Daily  Living: In your present state of health, do you have any difficulty performing the following activities?: only since he has had wrist cast  Preparing food and eating?: No  Bathing yourself: No  Getting dressed: No  Using the toilet:No  Moving around from place to place: No  In the past year have you fallen or had a near fall?:No    Home Safety: Has smoke detector and wears seat belts. Firearms are safely secured. No excess sun exposure.  Diet and Exercise  Current exercise habits: limited by health problems Dietary issues discussed: pt reports he overeats  Depression Screen  Q1: Over the past two weeks, have you felt down, depressed or hopeless? no  Q2: Over the past two weeks, have you felt little interest or pleasure in doing things? no   The following portions of the patient's history were reviewed and updated as appropriate: allergies, current medications, past family history, past medical history, past social history, past surgical history and problem list.   Review of Systems  no change in chronic visual or hearing loss Objective:   Vision:  Sees opthalmologist Hearing: grossly normal Body mass index:  See vs page Msk: pt slowly performs "get-up-and-go" from a sitting position, with the aid of a walker Cognitive Impairment Assessment: cognition, memory and judgment appear normal.  remembers 3/3 at 5 minutes.  excellent recall.  can easily read and write a sentence.  alert and oriented x 3.     Assessment:   Medicare wellness utd on preventive parameters    Plan:   During the course of the visit the patient was educated and counseled about appropriate screening and preventive services including:        Fall prevention   Diabetes screening  Nutrition counseling   Vaccines / LABS Zostavax / Pneumococcal Vaccine  today   Patient Instructions (the written plan) was given to the patient.

## 2015-02-20 NOTE — Progress Notes (Signed)
we discussed code status.  pt requests full code, but would not want to be started or maintained on artificial life-support measures if there was not a reasonable chance of recovery 

## 2015-02-20 NOTE — Patient Instructions (Addendum)
please consider these measures for your health:  minimize alcohol.  do not use tobacco products.  have a colonoscopy at least every 10 years from age 74.  keep firearms safely stored.  always use seat belts.  have working smoke alarms in your home.  see an eye doctor and dentist regularly.  never drive under the influence of alcohol or drugs (including prescription drugs).  those with fair skin should take precautions against the sun. it is critically important to prevent falling down (keep floor areas well-lit, dry, and free of loose objects.  If you have a cane, walker, or wheelchair, you should use it, even for short trips around the house.  Also, try not to rush) blood tests are being requested for you today.  We'll let you know about the results. Please come back for a follow-up appointment in 3 months

## 2015-02-21 ENCOUNTER — Ambulatory Visit: Payer: Commercial Managed Care - HMO | Admitting: Family Medicine

## 2015-02-21 LAB — PTH, INTACT AND CALCIUM
Calcium: 9.1 mg/dL (ref 8.4–10.5)
PTH: 43 pg/mL (ref 14–64)

## 2015-02-22 ENCOUNTER — Other Ambulatory Visit: Payer: Self-pay

## 2015-02-22 ENCOUNTER — Telehealth: Payer: Self-pay | Admitting: Endocrinology

## 2015-02-22 ENCOUNTER — Ambulatory Visit: Payer: Commercial Managed Care - HMO | Admitting: Family Medicine

## 2015-02-22 DIAGNOSIS — H409 Unspecified glaucoma: Secondary | ICD-10-CM

## 2015-02-22 NOTE — Telephone Encounter (Signed)
Patient need a referral to the eye doctor Dr Edilia Bo, for Open Angle Glaucoma, code H40.11x3, NPI [id number]

## 2015-02-22 NOTE — Telephone Encounter (Signed)
See below, Thanks!  

## 2015-02-22 NOTE — Telephone Encounter (Signed)
done

## 2015-02-26 ENCOUNTER — Encounter: Payer: Self-pay | Admitting: Internal Medicine

## 2015-02-26 ENCOUNTER — Ambulatory Visit (INDEPENDENT_AMBULATORY_CARE_PROVIDER_SITE_OTHER): Payer: Commercial Managed Care - HMO | Admitting: Internal Medicine

## 2015-02-26 VITALS — BP 114/50 | HR 73 | Ht 70.0 in | Wt 314.2 lb

## 2015-02-26 DIAGNOSIS — I442 Atrioventricular block, complete: Secondary | ICD-10-CM

## 2015-02-26 DIAGNOSIS — G4733 Obstructive sleep apnea (adult) (pediatric): Secondary | ICD-10-CM

## 2015-02-26 DIAGNOSIS — I481 Persistent atrial fibrillation: Secondary | ICD-10-CM | POA: Diagnosis not present

## 2015-02-26 DIAGNOSIS — I4819 Other persistent atrial fibrillation: Secondary | ICD-10-CM

## 2015-02-26 DIAGNOSIS — I2510 Atherosclerotic heart disease of native coronary artery without angina pectoris: Secondary | ICD-10-CM

## 2015-02-26 LAB — MDC_IDC_ENUM_SESS_TYPE_INCLINIC
Battery Remaining Longevity: 87.6 mo
Battery Voltage: 2.93 V
Brady Statistic RA Percent Paced: 69 %
Implantable Pulse Generator Model: 2210
Lead Channel Impedance Value: 400 Ohm
Lead Channel Pacing Threshold Amplitude: 0.625 V
Lead Channel Pacing Threshold Amplitude: 0.75 V
Lead Channel Pacing Threshold Amplitude: 0.75 V
Lead Channel Pacing Threshold Pulse Width: 0.5 ms
Lead Channel Pacing Threshold Pulse Width: 0.5 ms
Lead Channel Sensing Intrinsic Amplitude: 4.2 mV
Lead Channel Setting Pacing Amplitude: 1 V
Lead Channel Setting Pacing Amplitude: 1.625
Lead Channel Setting Pacing Pulse Width: 0.5 ms
MDC IDC MSMT LEADCHNL RA PACING THRESHOLD PULSEWIDTH: 0.5 ms
MDC IDC MSMT LEADCHNL RV IMPEDANCE VALUE: 375 Ohm
MDC IDC MSMT LEADCHNL RV SENSING INTR AMPL: 10.5 mV
MDC IDC PG SERIAL: 7152830
MDC IDC SESS DTM: 20160321161714
MDC IDC SET LEADCHNL RV SENSING SENSITIVITY: 2 mV
MDC IDC STAT BRADY RV PERCENT PACED: 40 %

## 2015-02-26 NOTE — Progress Notes (Signed)
PCP:  Renato Shin, MD  The patient presents today for electrophysiology followup.  H/o  diastolic dysfunction.  He was last cardioverted from afib 11/08/14.Paced rhythm today on EKG. Afib burden less than 1% by interrogation. He struggles with sequelae from his morbid obesity.  He has chronic venous insufficiency and unsteadiness chronically and walks with a walker.  He has become progressively debilitated  over the past few years. Has noticed rare episode of feeling like his heart rate increases at times.  Today, he denies symptoms of palpitations, chest pain, orthopnea, PND,  presyncope, syncope, or neurologic sequela.  The patient feels that he is tolerating medications without difficulties and is otherwise without complaint today.   Past Medical History  Diagnosis Date  . Diabetes mellitus   . COPD (chronic obstructive pulmonary disease)     CPAP  . Renal insufficiency   . Hyperlipemia   . Hypothyroidism   . Hyperkalemia   . 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 Sick sinus syndrome     s/p PPM by JA  . Pancreatitis   . Morbid obesity   . OSA on CPAP     using CPAP although it is hard for him to tolerate  . CAD (coronary artery disease)   . Hypertensive cardiovascular-renal disease   . Depression   . Anxiety   . H/O Legionnaire's disease 2003  . History of blood clots     R groin  . Peripheral neuropathy   . Osteoarthritis     fingers  . GERD (gastroesophageal reflux disease)   . Anemia     hx of  . Pneumonia 2003  . Cancer 2014    bladder cancer AND RIGHT URETERAL CANCER  . Diastolic dysfunction with chronic heart failure   . Persistent atrial fibrillation   . Atypical atrial flutter   . Venous insufficiency   . Complete heart block     cardioverted from A.Fib,hx. Sick sinus syndrome -Pacemaker implanted  . Pacemaker     implanted St. Jude 7'11  . CHF (congestive heart failure)   . Gait difficulty     slow gait"ambulates with walker"   Past  Surgical History  Procedure Laterality Date  . Nasal septum surgery  1967  . Pacemaker placement  06/2010    [facility] Accent RF DR, Model [id number] ( Serial number [id number])  . Thromboembolectomy and four compartment fasciotomy  2009    GROIN AREA  . Cataract extraction Right   . Cholecystectomy  2012  . Insert / replace / remove pacemaker  2011  . Transurethral resection of bladder tumor with gyrus (turbt-gyrus) N/A 10/26/2013    Procedure: TRANSURETHRAL RESECTION OF BLADDER TUMOR WITH GYRUS (TURBT-GYRUS);  Surgeon: Alexis Frock, MD;  Location: [facility];  Service: Urology;  Laterality: N/A;  . Cystoscopy w/ ureteral stent placement Right 10/26/2013    Procedure: CYSTOSCOPY WITH RETROGRADE PYELOGRAM/URETERAL STENT PLACEMENT;  Surgeon: Alexis Frock, MD;  Location: [facility];  Service: Urology;  Laterality: Right;  . Embolectomy Left 11/02/2013    Procedure: LEFT FEMORAL EMBOLECTOMY, LEFT FEMORAL ARTERY ENDARTERECTOMY WITH DACRON PATCH ANGIOPLASTY.;  Surgeon: Mal Misty, MD;  Location: [place];  Service: Vascular;  Laterality: Left;  . Transurethral resection of bladder tumor with gyrus (turbt-gyrus) N/A 01/11/2014    Procedure: TRANSURETHRAL RESECTION OF BLADDER TUMOR WITH GYRUS (TURBT-GYRUS);  Surgeon: Alexis Frock, MD;  [facility];  Service: Urology;  Laterality: N/A;  . Cystoscopy with  ureteroscopy and stent placement Right 01/11/2014    Procedure: CYSTOSCOPY WITH URETEROSCOPY ,RIGHT RETROGRADE AND STENT CHANGE AND LASER OF URETERAL TUMOR;  Surgeon: Alexis Frock, MD;  [facility];  Service: Urology;  Laterality: Right;  . Holmium laser application Right 06/15/3902    Procedure: HOLMIUM LASER APPLICATION;  Surgeon: Alexis Frock, MD;  [facility];  Service: Urology;  Laterality: Right;  . Tee without cardioversion N/A 05/16/2014    Procedure: TRANSESOPHAGEAL ECHOCARDIOGRAM (TEE);  Surgeon: Josue Hector, MD;  Location: [facility] ENDOSCOPY;  Service: Cardiovascular;  Laterality:  N/A;  . Cardioversion N/A 05/16/2014    Procedure: CARDIOVERSION;  Surgeon: Josue Hector, MD;  Location: [facility] ENDOSCOPY;  Service: Cardiovascular;  Laterality: N/A;  . Cardiac catheterization      11-02-13  . Cystoscopy/retrograde/ureteroscopy Right 08/23/2014    Procedure: CYSTOSCOPY/RETROGRADE/ DIAGNOSTIC URETEROSCOPY/RIGHT RENAL STONE EXTRACTION;  Surgeon: Alexis Frock, MD;  [facility];  Service: Urology;  Laterality: Right;  . Cardioversion N/A 11/08/2014    Procedure: CARDIOVERSION;  Surgeon: Fay Records, MD;  Location: [facility] ENDOSCOPY;  Service: Cardiovascular;  Laterality: N/A;    Current Outpatient Prescriptions  Medication Sig Dispense Refill  . acetaminophen (TYLENOL) 500 MG tablet Take 500 mg by mouth 2 (two) times daily as needed for moderate pain (pain).     Marland Kitchen albuterol (PROVENTIL HFA;VENTOLIN HFA) 108 (90 BASE) MCG/ACT inhaler Inhale 2 puffs into the lungs every 6 (six) hours as needed for wheezing or shortness of breath. 1 Inhaler 6  . ALPRAZolam (XANAX) 0.25 MG tablet Take 0.25 mg by mouth 3 (three) times daily as needed for anxiety.    Marland Kitchen apixaban (ELIQUIS) 5 MG TABS tablet Take 1 tablet (5 mg total) by mouth 2 (two) times daily. 180 tablet 3  . Ascorbic Acid (VITAMIN C) 500 MG tablet Take 500 mg by mouth daily.     . brimonidine (ALPHAGAN P) 0.1 % SOLN Place 1 drop into the left eye 3 (three) times daily.     . calcium carbonate (TUMS - DOSED IN MG ELEMENTAL CALCIUM) 500 MG chewable tablet Chew 2 tablets by mouth 2 (two) times daily as needed for indigestion or heartburn.    . cholecalciferol (VITAMIN D) 1000 UNITS tablet Take 1,000 Units by mouth daily.    . CRESTOR 40 MG tablet TAKE 1/2 TABLET EVERY EVENING (Patient taking differently: TAKE 1/2 TABLET BY MOUTH EVERY EVENING) 45 tablet 2  . dextromethorphan (DELSYM) 30 MG/5ML liquid Take 90 mg by mouth 2 (two) times daily as needed for cough.    . dorzolamide-timolol (COSOPT) 22.3-6.8 MG/ML ophthalmic solution Place 1 drop  into both eyes 2 (two) times daily.     . fluticasone (FLONASE) 50 MCG/ACT nasal spray Place 2 sprays into both nostrils daily as needed for allergies or rhinitis.    . furosemide (LASIX) 40 MG tablet Take one tab (40mg ) in the AM, and 1/2 tab (20mg ) in the PM (Patient taking differently: Take one tab (40mg ) by mouth in the AM, and 1/2 tab (20mg ) by mouth in the PM) 135 tablet 3  . guaiFENesin (MUCINEX) 600 MG 12 hr tablet Take 600 mg by mouth 2 (two) times daily as needed for cough or to loosen phlegm.     Marland Kitchen HUMULIN R 500 UNIT/ML SOLN injection DRAW UP 50 UNITS ON U100 SYRINGE (250 ACTUAL INSULIN UNITS) AND INJECT SUBCUTANEOUSLY 4 TIMES DAILY. 80 mL 2  . Lactobacillus (ACIDOPHILUS) CAPS capsule Take 1 capsule by mouth daily.    Marland Kitchen  latanoprost (XALATAN) 0.005 % ophthalmic solution Place 1 drop into both eyes at bedtime.    Marland Kitchen levothyroxine (SYNTHROID, LEVOTHROID) 50 MCG tablet TAKE 1 TABLET EVERY DAY BEFORE BREAKFAST (Patient taking differently: TAKE 1 TABLET BY MOUTH EVERY DAY BEFORE BREAKFAST) 90 tablet 3  . metoprolol tartrate (LOPRESSOR) 25 MG tablet Take 1 tablet (25 mg total) by mouth 3 (three) times daily. 270 tablet 3  . Multiple Vitamin (MULTIVITAMIN WITH MINERALS) TABS tablet Take 1 tablet by mouth daily.    . Omega-3 Fatty Acids (FISH OIL) 1000 MG CAPS Take 1,000 mg by mouth every morning.     Marland Kitchen omeprazole (PRILOSEC) 40 MG capsule TAKE 1 CAPSULE EVERY DAY (Patient taking differently: TAKE 1 CAPSULE BY MOUTH EVERY DAY) 90 capsule 1  . senna-docusate (SENOKOT-S) 8.6-50 MG per tablet Take 1 tablet by mouth 2 (two) times daily. While taking pain meds to prevent constipation. 20 tablet 0  . traMADol (ULTRAM) 50 MG tablet Take 100 mg by mouth every 6 (six) hours as needed (back pain).    Marland Kitchen venlafaxine XR (EFFEXOR-XR) 150 MG 24 hr capsule Take 150 mg by mouth daily as needed (anxiety.).     No current facility-administered medications for this visit.    Allergies  Allergen Reactions  . Actos  [Pioglitazone] Swelling    History   Social History  . Marital Status: Married    Spouse Name: N/A  . Number of Children: N/A  . 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 Smoking status: Former Smoker -- 2.00 packs/day for 50 years    Types: Cigarettes    Quit date: 12/08/2006  . Smokeless tobacco: Never Used  . Alcohol Use: No     Comment: quit 3 years ago  . Drug Use: No  . Sexual Activity: Not Currently   Other Topics Concern  . Not on file   Social History Narrative   Family History  Problem Relation Age of Onset  . Liver cancer Mother     deceased age 9  . Cancer Mother     liver cancer  . Colon cancer Neg Hx   . Heart attack Father     ROS-  All systems are reviewed and are negative except as outlined in the HPI above  Physical Exam: Filed Vitals:   02/26/15 1449  BP: 114/50  Pulse: 73  Height: 5\' 10"  (1.778 m)  Weight: 314 lb 3.2 oz (142.52 kg)    GEN- The patient is morbidly obese and chronically ill appearing, alert and oriented x 3 today.   Head- normocephalic, atraumatic Eyes-  Sclera clear, conjunctiva pink Ears- poor hearing (wears hearing aids) Oropharynx- clear with very dry MM Neck- supple,  Flat JVP Lungs- Clear to ausculation bilaterally, normal work of breathing Heart- Regular rate and rhythm  GI- soft, NT, ND, + BS Extremities- no clubbing, cyanosis, + venous stasis changes with chronic edema MS- walks very slowly with a walker, very debilitated Skin- no rash or lesion Psych- euthymic mood, full affect Neuro- very unsteady today Pacemaker pocket is well healed    ekg today reveals AV pacing Pacemaker interrogation is reviewed today (see paceart)  Assessment and Plan:  1. Persistent afib Maintaining sinus rhythm presently, low afib burden Continue apixaban at 5mg  bid, recent bmet 3/15, appropriately dosed with creatinine at 1.62 with CrCl calculated  84.3 ml/min  2. Chronic diastolic dysfunction The importance  of sodium restriction and daily weight is advised  4. Sick sinus syndrome/ complete heart block See Pace Art report Changes made today--> due to frequent PMT, I increased PVARP from 264msec to 325 msec (today, VA time was 290 msec).  MTR was automatically changed from 120-->110 bmp  5. Chronic venous stasis changes His BLE edema is primarily due to obesity.  Weight loss is encouraged.  Support hose are also recommended.  6. Morbid obesity Body mass index is 45.08 kg/(m^2). I have strongly advised weight reduction.  I think that his weight will certainly impact both quality and quantity of life for him.  I have referred to nutrition (he has diabetes).  He is also referred to cardiac rehab.  7. OSA Compliance with CPAP is encouraged  8. Hypertensive cardio-renal disease Continue aggressive management Watch closely  F/u with Truitt Merle in 3 months I will see in one year

## 2015-02-26 NOTE — Progress Notes (Deleted)
PCP:  Renato Shin, MD  The patient presents today for electrophysiology followup.  H/o  diastolic dysfunction.  He was last cardioverted from afib 11/08/14.Paced rhythm today on EKG. Afib burden less than 1% by interrogation. He struggles with sequelae from his morbid obesity.  He has chronic venous insufficiency and unsteadiness chronically and walks with a walker.  He has become progressively debilitated  over the past few years. Has noticed rare episode of feeling like his heart rate increases at times.  Today, he denies symptoms of palpitations, chest pain, orthopnea, PND,  presyncope, syncope, or neurologic sequela.  The patient feels that he is tolerating medications without difficulties and is otherwise without complaint today.   Past Medical History  Diagnosis Date  . Diabetes mellitus   . COPD (chronic obstructive pulmonary disease)     CPAP  . Renal insufficiency   . Hyperlipemia   . Hypothyroidism   . Hyperkalemia   . 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 Sick sinus syndrome     s/p PPM by JA  . Pancreatitis   . Morbid obesity   . OSA on CPAP     using CPAP although it is hard for him to tolerate  . CAD (coronary artery disease)   . Hypertensive cardiovascular-renal disease   . Depression   . Anxiety   . H/O Legionnaire's disease 2003  . History of blood clots     R groin  . Peripheral neuropathy   . Osteoarthritis     fingers  . GERD (gastroesophageal reflux disease)   . Anemia     hx of  . Pneumonia 2003  . Cancer 2014    bladder cancer AND RIGHT URETERAL CANCER  . Diastolic dysfunction with chronic heart failure   . Persistent atrial fibrillation   . Atypical atrial flutter   . Venous insufficiency   . Complete heart block     cardioverted from A.Fib,hx. Sick sinus syndrome -Pacemaker implanted  . Pacemaker     implanted [employer] 7'11  . CHF (congestive heart failure)   . Gait difficulty     slow gait"ambulates with walker"   Past  Surgical History  Procedure Laterality Date  . Nasal septum surgery  1967  . Pacemaker placement  06/2010    [facility] Accent RF DR, Model [id number] ( Serial number [id number])  . Thromboembolectomy and four compartment fasciotomy  2009    GROIN AREA  . Cataract extraction Right   . Cholecystectomy  2012  . Insert / replace / remove pacemaker  2011  . Transurethral resection of bladder tumor with gyrus (turbt-gyrus) N/A 10/26/2013    Procedure: TRANSURETHRAL RESECTION OF BLADDER TUMOR WITH GYRUS (TURBT-GYRUS);  Surgeon: Alexis Frock, MD;  Location: [facility];  Service: Urology;  Laterality: N/A;  . Cystoscopy w/ ureteral stent placement Right 10/26/2013    Procedure: CYSTOSCOPY WITH RETROGRADE PYELOGRAM/URETERAL STENT PLACEMENT;  Surgeon: Alexis Frock, MD;  Location: [facility];  Service: Urology;  Laterality: Right;  . Embolectomy Left 11/02/2013    Procedure: LEFT FEMORAL EMBOLECTOMY, LEFT FEMORAL ARTERY ENDARTERECTOMY WITH DACRON PATCH ANGIOPLASTY.;  Surgeon: Mal Misty, MD;  Location: [facility];  Service: Vascular;  Laterality: Left;  . Transurethral resection of bladder tumor with gyrus (turbt-gyrus) N/A 01/11/2014    Procedure: TRANSURETHRAL RESECTION OF BLADDER TUMOR WITH GYRUS (TURBT-GYRUS);  Surgeon: Alexis Frock, MD;  Location: [facility];  Service: Urology;  Laterality: N/A;  . Cystoscopy with  ureteroscopy and stent placement Right 01/11/2014    Procedure: CYSTOSCOPY WITH URETEROSCOPY ,RIGHT RETROGRADE AND STENT CHANGE AND LASER OF URETERAL TUMOR;  Surgeon: Alexis Frock, MD;  Location: [facility];  Service: Urology;  Laterality: Right;  . Holmium laser application Right 04/14/5276    Procedure: HOLMIUM LASER APPLICATION;  Surgeon: Alexis Frock, MD;  Location: [facility];  Service: Urology;  Laterality: Right;  . Tee without cardioversion N/A 05/16/2014    Procedure: TRANSESOPHAGEAL ECHOCARDIOGRAM (TEE);  Surgeon: Josue Hector, MD;  Location: [facility];  Service: Cardiovascular;  Laterality:  N/A;  . Cardioversion N/A 05/16/2014    Procedure: CARDIOVERSION;  Surgeon: Josue Hector, MD;  Location: [facility]COPY;  Service: Cardiovascular;  Laterality: N/A;  . Cardiac catheterization      11-02-13  . Cystoscopy/retrograde/ureteroscopy Right 08/23/2014    Procedure: CYSTOSCOPY/RETROGRADE/ DIAGNOSTIC URETEROSCOPY/RIGHT RENAL STONE EXTRACTION;  Surgeon: Alexis Frock, MD;  Location: [facility];  Service: Urology;  Laterality: Right;  . Cardioversion N/A 11/08/2014    Procedure: CARDIOVERSION;  Surgeon: Fay Records, MD;  Location: [facility] ENDOSCOPY;  Service: Cardiovascular;  Laterality: N/A;    Current Outpatient Prescriptions  Medication Sig Dispense Refill  . acetaminophen (TYLENOL) 500 MG tablet Take 500 mg by mouth 2 (two) times daily as needed for moderate pain (pain).     Marland Kitchen albuterol (PROVENTIL HFA;VENTOLIN HFA) 108 (90 BASE) MCG/ACT inhaler Inhale 2 puffs into the lungs every 6 (six) hours as needed for wheezing or shortness of breath. 1 Inhaler 6  . ALPRAZolam (XANAX) 0.25 MG tablet Take 0.25 mg by mouth 3 (three) times daily as needed for anxiety.    Marland Kitchen apixaban (ELIQUIS) 5 MG TABS tablet Take 1 tablet (5 mg total) by mouth 2 (two) times daily. 180 tablet 3  . Ascorbic Acid (VITAMIN C) 500 MG tablet Take 500 mg by mouth daily.     . brimonidine (ALPHAGAN P) 0.1 % SOLN Place 1 drop into the left eye 3 (three) times daily.     . calcium carbonate (TUMS - DOSED IN MG ELEMENTAL CALCIUM) 500 MG chewable tablet Chew 2 tablets by mouth 2 (two) times daily as needed for indigestion or heartburn.    . cholecalciferol (VITAMIN D) 1000 UNITS tablet Take 1,000 Units by mouth daily.    . CRESTOR 40 MG tablet TAKE 1/2 TABLET EVERY EVENING (Patient taking differently: TAKE 1/2 TABLET BY MOUTH EVERY EVENING) 45 tablet 2  . dextromethorphan (DELSYM) 30 MG/5ML liquid Take 90 mg by mouth 2 (two) times daily as needed for cough.    . dorzolamide-timolol (COSOPT) 22.3-6.8 MG/ML ophthalmic solution Place 1 drop  into both eyes 2 (two) times daily.     . fluticasone (FLONASE) 50 MCG/ACT nasal spray Place 2 sprays into both nostrils daily as needed for allergies or rhinitis.    . furosemide (LASIX) 40 MG tablet Take one tab (40mg ) in the AM, and 1/2 tab (20mg ) in the PM (Patient taking differently: Take one tab (40mg ) by mouth in the AM, and 1/2 tab (20mg ) by mouth in the PM) 135 tablet 3  . guaiFENesin (MUCINEX) 600 MG 12 hr tablet Take 600 mg by mouth 2 (two) times daily as needed for cough or to loosen phlegm.     Marland Kitchen HUMULIN R 500 UNIT/ML SOLN injection DRAW UP 50 UNITS ON U100 SYRINGE (250 ACTUAL INSULIN UNITS) AND INJECT SUBCUTANEOUSLY 4 TIMES DAILY. 80 mL 2  . Lactobacillus (ACIDOPHILUS) CAPS capsule Take 1 capsule by mouth daily.    Marland Kitchen  latanoprost (XALATAN) 0.005 % ophthalmic solution Place 1 drop into both eyes at bedtime.    Marland Kitchen levothyroxine (SYNTHROID, LEVOTHROID) 50 MCG tablet TAKE 1 TABLET EVERY DAY BEFORE BREAKFAST (Patient taking differently: TAKE 1 TABLET BY MOUTH EVERY DAY BEFORE BREAKFAST) 90 tablet 3  . metoprolol tartrate (LOPRESSOR) 25 MG tablet Take 1 tablet (25 mg total) by mouth 3 (three) times daily. 270 tablet 3  . Multiple Vitamin (MULTIVITAMIN WITH MINERALS) TABS tablet Take 1 tablet by mouth daily.    . Omega-3 Fatty Acids (FISH OIL) 1000 MG CAPS Take 1,000 mg by mouth every morning.     Marland Kitchen omeprazole (PRILOSEC) 40 MG capsule TAKE 1 CAPSULE EVERY DAY (Patient taking differently: TAKE 1 CAPSULE BY MOUTH EVERY DAY) 90 capsule 1  . senna-docusate (SENOKOT-S) 8.6-50 MG per tablet Take 1 tablet by mouth 2 (two) times daily. While taking pain meds to prevent constipation. 20 tablet 0  . traMADol (ULTRAM) 50 MG tablet Take 100 mg by mouth every 6 (six) hours as needed (back pain).    Marland Kitchen venlafaxine XR (EFFEXOR-XR) 150 MG 24 hr capsule Take 150 mg by mouth daily as needed (anxiety.).     No current facility-administered medications for this visit.    Allergies  Allergen Reactions  . Actos  [Pioglitazone] Swelling    History   Social History  . Marital Status: Married    Spouse Name: N/A  . Number of Children: N/A  . 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 Smoking status: Former Smoker -- 2.00 packs/day for 50 years    Types: Cigarettes    Quit date: 12/08/2006  . Smokeless tobacco: Never Used  . Alcohol Use: No     Comment: quit 3 years ago  . Drug Use: No  . Sexual Activity: Not Currently   Other Topics Concern  . Not on file   Social History Narrative   Family History  Problem Relation Age of Onset  . Liver cancer Mother     deceased age 30  . Cancer Mother     liver cancer  . Colon cancer Neg Hx   . Heart attack Father     ROS-  All systems are reviewed and are negative except as outlined in the HPI above  Physical Exam: Filed Vitals:   02/26/15 1449  BP: 114/50  Pulse: 73  Height: 5\' 10"  (1.778 m)  Weight: 314 lb 3.2 oz (142.52 kg)    GEN- The patient is morbidly obese and chronically ill appearing, alert and oriented x 3 today.   Head- normocephalic, atraumatic Eyes-  Sclera clear, conjunctiva pink Ears- poor hearing (wears hearing aids) Oropharynx- clear with very dry MM Neck- supple,  Flat JVP Lungs- Clear to ausculation bilaterally, normal work of breathing Heart- Regular rate and rhythm  GI- soft, NT, ND, + BS Extremities- no clubbing, cyanosis, + venous stasis changes with chronic edema MS- walks very slowly with a walker, very debilitated Skin- no rash or lesion Psych- euthymic mood, full affect Neuro- very unsteady today Pacemaker pocket is well healed    ekg today reveals AV pacing Pacemaker interrogation is reviewed today and ***  Assessment and Plan:  1. Persistent afib Maintaining sinus rhythm presently, low afib burden Continue apixaban at 5mg  bid, recent bmet 3/15, appropriately dosed  with creatinine at 1.62 with CrCl calculated 84.3  ml/min  2. Chronic diastolic dysfunction The  importance of sodium restriction and daily weight is advised  4. Sick sinus syndrome/ complete heart block See Pace Art report Changes made today ***  5. Chronic venous stasis changes His BLE edema is primarily due to obesity.  Weight loss is encouraged.  Support hose are also recommended.  6. Morbid obesity Body mass index is 45.08 kg/(m^2). I have strongly advised weight reduction.  I think that his weight will certainly impact both quality and quantity of life for him.  I have referred to nutrition (he has diabetes).  He is also referred to cardiac rehab.  7. OSA Compliance with CPAP is encouraged  8. Hypertensive cardio-renal disease Continue aggressive management Watch closely  F/u with Truitt Merle, NP in 3 months Here in one year

## 2015-02-26 NOTE — Patient Instructions (Signed)
Your physician recommends that you schedule a follow-up appointment in: 3 months with Truitt Merle NP  Your physician wants you to follow-up in: 12 months with Dr. Rayann Heman. You will receive a reminder letter in the mail two months in advance. If you don't receive a letter, please call our office to schedule the follow-up appointment.  Remote monitoring is used to monitor your Pacemaker or ICD from home. This monitoring reduces the number of office visits required to check your device to one time per year. It allows [place] to keep an eye on the functioning of your device to ensure it is working properly. You are scheduled for a device check from home on 05/28/15. You may send your transmission at any time that day. If you have a wireless device, the transmission will be sent automatically. After your physician reviews your transmission, you will receive a postcard with your next transmission date.

## 2015-02-27 ENCOUNTER — Ambulatory Visit (INDEPENDENT_AMBULATORY_CARE_PROVIDER_SITE_OTHER): Payer: Commercial Managed Care - HMO | Admitting: Family Medicine

## 2015-02-27 ENCOUNTER — Encounter: Payer: Self-pay | Admitting: Family Medicine

## 2015-02-27 VITALS — BP 104/62 | HR 75 | Ht 70.0 in | Wt 313.0 lb

## 2015-02-27 DIAGNOSIS — M25531 Pain in right wrist: Secondary | ICD-10-CM

## 2015-02-27 NOTE — Assessment & Plan Note (Signed)
This does have a healing scaphoid fracture. He seems to be doing very well. Continue the vitamin D supplementation in the soft cast for another 2 weeks. Patient will come out of the cast 1-2 times daily for range of motion exercises but will avoid any type of strengthening at this time. Patient will then come back in 3 weeks for further evaluation.

## 2015-02-27 NOTE — Patient Instructions (Signed)
Good to see you You have been working  You are healing and will take another 2 weeks Start with range of motion exercises 2 times daily Ice after a lot of activity Keep moving it or we may need to consider physical therapy See me again in 3 weeks.

## 2015-02-27 NOTE — Progress Notes (Signed)
  [facility] [address] [place], [place] [address] Phone: [phone] Subjective:   CC: thumb pain follow up  [id number] [name] is a 74 y.o. male coming in with complaint of thumb pain. Patient was previously seen and did have more of a de Quervain's tenosynovitis. Patient was given an injection. 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 Patient and forcefully had significant pain over the scaphoid bone and was put into a wrist splint. Patient was to wear this on a regular basis. Patient states he has noticed improvement. Patient had to go back to a soft brace though because the hardware was causing sores on the skin. Patient states that the pain is improving. Not as bad as it was. No new symptoms.   Past medical history, social, surgical and family history all reviewed in electronic medical record.   Review of Systems: No headache, visual changes, nausea, vomiting, diarrhea, constipation, dizziness, abdominal pain, skin rash, fevers, chills, night sweats, weight loss, swollen lymph nodes, body aches, joint swelling, muscle aches, chest pain, shortness of breath, mood changes.   Objective Blood pressure 104/62, pulse 75, height 5\' 10"  (1.778 m), weight 313 lb (141.976 kg), SpO2 97 %.  General: No apparent distress alert and oriented x3 mood and affect normal, dressed appropriately. Obese.  HEENT: Pupils equal, extraocular movements intact  Respiratory: Patient's speak in full sentences does have some mild shortness of breath at rest. Patient states that this is his baseline. Cardiovascular: +1 pitting edema of the lower extremities bilaterally no erythema noted Skin: Warm dry intact with no signs of infection or rash on extremities or on axial skeleton.  Abdomen: Soft nontender obese Neuro: Cranial nerves II through XII are intact, neurovascularly intact in all extremities with 2+ DTRs and 2+ pulses.  Lymph: No lymphadenopathy of posterior or anterior  cervical chain or axillae bilaterally.  Gait normal with good balance and coordination.  MSK:  Non tender with full range of motion and good stability and symmetric strength and tone of shoulders, elbows, wrist, hip, knee and ankles bilaterally. Difficult to assess secondary to patient's body habitus.  Wrist: Right Inspection normal with no visible erythema or swelling. ROM smooth and normal with good flexion and extension a bit ulnar and radial deviation does give pain on the radial aspect of the wrist. Palpation is normal over metacarpals, navicular, lunate, and TFCC; tendons without tenderness/ swelling Decreased snuffbox tenderness No tenderness over Canal of [place]. Strength 5/5 in all directions without pain. Negative Finkelstein, tinel's and phalens. Negative Watson's test.   Limited muscular skeletal ultrasound was performed and interpreted by Hulan Saas, M  Limited ultrasound shows area where patient did have cortical defect noted of the scaphoid bone does have significant callus formation noted. Decreased hypoechoic changes. Impression: Healing scaphoid fracture           Impression and Recommendations:     This case required medical decision making of moderate complexity.

## 2015-03-06 DIAGNOSIS — H4011X3 Primary open-angle glaucoma, severe stage: Secondary | ICD-10-CM | POA: Diagnosis not present

## 2015-03-06 DIAGNOSIS — Z961 Presence of intraocular lens: Secondary | ICD-10-CM | POA: Diagnosis not present

## 2015-03-06 DIAGNOSIS — H40052 Ocular hypertension, left eye: Secondary | ICD-10-CM | POA: Diagnosis not present

## 2015-03-19 DIAGNOSIS — H4011X3 Primary open-angle glaucoma, severe stage: Secondary | ICD-10-CM | POA: Diagnosis not present

## 2015-03-21 ENCOUNTER — Encounter: Payer: Self-pay | Admitting: Family Medicine

## 2015-03-21 ENCOUNTER — Ambulatory Visit (INDEPENDENT_AMBULATORY_CARE_PROVIDER_SITE_OTHER): Payer: Commercial Managed Care - HMO | Admitting: Family Medicine

## 2015-03-21 VITALS — BP 110/66 | HR 75 | Wt 314.0 lb

## 2015-03-21 DIAGNOSIS — M654 Radial styloid tenosynovitis [de Quervain]: Secondary | ICD-10-CM | POA: Diagnosis not present

## 2015-03-21 NOTE — Assessment & Plan Note (Signed)
She was doing significantly better with the scaphoid at this time. Encourage patient to continue the range of motion exercises as well as icing protocol and bracing at night. Come back again in 3 weeks for further evaluation. Patient if continuing have pain secondary to more of the positive Finkelstein's than we may need to consider another ultrasound guided injection. Patient doing a short course of topical anti-inflammatory as well and a 3 day burst of oral anti-inflammatory but will avoid chronic use secondary to his other comorbidities. Spent  25 minutes with patient face-to-face and had greater than 50% of counseling including as described above in assessment and plan.

## 2015-03-21 NOTE — Progress Notes (Signed)
  [facility] [address] [place], [place] [address] Phone: [phone] Subjective:   CC: thumb pain follow up  YWV:[id number] [name] is a 74 y.o. male coming in with complaint of thumb pain. Patient was previously seen and did have more of a de Quervain's tenosynovitis. Patient was given an injection. 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 Patient then had unfortunately a scaphoid fracture. Patient was put in a brace for long amount of time. Patient states that it is doing significantly better. Patient states approximate 40-70% better. Patient is increasing the range of motion fairly regularly. Patient denies any numbness or tingling. States that some pain with certain movements. Much less severe pain than previous exam. Past medical history, social, surgical and family history all reviewed in electronic medical record.   Review of Systems: No headache, visual changes, nausea, vomiting, diarrhea, constipation, dizziness, abdominal pain, skin rash, fevers, chills, night sweats, weight loss, swollen lymph nodes, body aches, joint swelling, muscle aches, chest pain, shortness of breath, mood changes.   Objective Blood pressure 110/66, weight 314 lb (142.429 kg).  General: No apparent distress alert and oriented x3 mood and affect normal, dressed appropriately. Obese.  HEENT: Pupils equal, extraocular movements intact  Respiratory: Patient's speak in full sentences does have some mild shortness of breath at rest. Patient states that this is his baseline. Cardiovascular: +1 pitting edema of the lower extremities bilaterally no erythema noted Skin: Warm dry intact with no signs of infection or rash on extremities or on axial skeleton.  Abdomen: Soft nontender obese Neuro: Cranial nerves II through XII are intact, neurovascularly intact in all extremities with 2+ DTRs and 2+ pulses.  Lymph: No lymphadenopathy of posterior or anterior cervical chain or axillae  bilaterally.  Gait normal with good balance and coordination.  MSK:  Non tender with full range of motion and good stability and symmetric strength and tone of shoulders, elbows, wrist, hip, knee and ankles bilaterally. Difficult to assess secondary to patient's body habitus.  Wrist: Right Inspection normal with no visible erythema or swelling. ROM smooth and normal with good flexion and extension a bit ulnar and radial deviation does give pain on the radial aspect of the wrist. Palpation is normal over metacarpals, navicular, lunate, and TFCC; tendons without tenderness/ swelling Minimal snuffbox tenderness No tenderness over Canal of [place]. Strength 5/5 in all directions without pain. Positive Finkelstein, negative tinel's and phalens. Negative Watson's test.   Limited muscular skeletal ultrasound was performed and interpreted by [name], M  Limited ultrasound showed healed bone the patient does have a worsening abductor pollicis longus tenosynovitis Impression: Healed scaphoid fracture, with recurrent de Quervain's tenosynovitis     Impression and Recommendations:     This case required medical decision making of moderate complexity.

## 2015-03-21 NOTE — Patient Instructions (Signed)
Good to see you Gel  2 times a day as needed Medicine 2 times a day for 3 days (Vimovo) Ice still can help Wear brace at night for 2 weeks Increase range of motin and some strengthening with soup can or hammer See me again in 3 weeks and if tendon is still angry we will consider another injection.

## 2015-03-21 NOTE — Progress Notes (Signed)
Pre visit review using our clinic review tool, if applicable. No additional management support is needed unless otherwise documented below in the visit note. 

## 2015-03-27 DIAGNOSIS — C669 Malignant neoplasm of unspecified ureter: Secondary | ICD-10-CM | POA: Diagnosis not present

## 2015-03-27 DIAGNOSIS — C670 Malignant neoplasm of trigone of bladder: Secondary | ICD-10-CM | POA: Diagnosis not present

## 2015-03-27 DIAGNOSIS — N3020 Other chronic cystitis without hematuria: Secondary | ICD-10-CM | POA: Diagnosis not present

## 2015-03-27 DIAGNOSIS — R1909 Other intra-abdominal and pelvic swelling, mass and lump: Secondary | ICD-10-CM | POA: Diagnosis not present

## 2015-03-27 DIAGNOSIS — N200 Calculus of kidney: Secondary | ICD-10-CM | POA: Diagnosis not present

## 2015-04-11 ENCOUNTER — Ambulatory Visit: Payer: Commercial Managed Care - HMO | Admitting: Family Medicine

## 2015-04-16 ENCOUNTER — Telehealth: Payer: Self-pay

## 2015-04-16 NOTE — Telephone Encounter (Signed)
The patient called in and explained that the wellness visit is being provided at the [facility] practice. The patient confirmed he is seen at the practice off [address]. Thanked him for call back back and apologized for error, but the patient did confirm he had eye exam this year at Dr. Baldemar Lenis and will fup to update the overdue HM.

## 2015-04-16 NOTE — Telephone Encounter (Signed)
If the patient calls, confirm that he is seen at another practice and if so, will educate on medicare wellness and o/d maintenance to complete

## 2015-04-17 NOTE — Telephone Encounter (Signed)
Call to Dr. Payton Emerald office and scheduler stated the patient had laser surgery on march 29 of this year, but annual eye exam is not due until 08/2015. Postponed eye exam until end of Sept/ 2016.

## 2015-04-25 ENCOUNTER — Ambulatory Visit (INDEPENDENT_AMBULATORY_CARE_PROVIDER_SITE_OTHER): Payer: Commercial Managed Care - HMO | Admitting: Vascular Surgery

## 2015-04-25 ENCOUNTER — Telehealth: Payer: Self-pay | Admitting: Surgery

## 2015-04-25 ENCOUNTER — Encounter: Payer: Self-pay | Admitting: Vascular Surgery

## 2015-04-25 ENCOUNTER — Ambulatory Visit (HOSPITAL_COMMUNITY)
Admission: RE | Admit: 2015-04-25 | Discharge: 2015-04-25 | Disposition: A | Discharge status: Home / Self Care | Payer: Commercial Managed Care - HMO | Source: Ambulatory Visit | Attending: Vascular Surgery | Admitting: Vascular Surgery

## 2015-04-25 ENCOUNTER — Other Ambulatory Visit: Payer: Self-pay | Admitting: *Deleted

## 2015-04-25 VITALS — BP 118/71 | HR 78 | Resp 18 | Ht 70.0 in | Wt 311.8 lb

## 2015-04-25 DIAGNOSIS — R1909 Other intra-abdominal and pelvic swelling, mass and lump: Secondary | ICD-10-CM

## 2015-04-25 DIAGNOSIS — R1903 Right lower quadrant abdominal swelling, mass and lump: Secondary | ICD-10-CM | POA: Insufficient documentation

## 2015-04-25 NOTE — Progress Notes (Signed)
HISTORY AND PHYSICAL     CC:  Ultrasound for mass in right groin Referring Provider:  Renato Shin, MD  HPI: This is a 74 y.o. male who was being seen by Dr. Johney Maine earlier today and was referred to Dr. Scot Dock for possible pseudoaneurysm.    He has been followed by Dr. Tresa Moore for bladder cancer and has undergone multiple surgeries by him.  He mentioned this growth to Dr. Tresa Moore and he was referred to surgery who referred the pt here today.  The pt states that about 6-8 months ago, he noticed a small area that looked like a wart or a mole.  Over the past few months it has gotten bigger.  Over the past several weeks, it has gotten even bigger more quickly.    The pt has a hx of right leg thrombectomy and fasciotomies in 2009 by Dr. Amedeo Plenty.  He did have an angiogram via right femoral artery in 2014 by Dr. Trula Slade.  He had a left femoral embolectomy with dacron patch angioplasty by Dr. Kellie Simmering in 2014.  He has returned to office only once since then for his postoperative visit.  He has concerns that this mass is arising from his right groin incision.    He is on Eliquis for Afib and hx of clots per pt.  He was seen by cardiology in March who noted the pt to have been cardioverted to [facility] December 2015 and PPM interrogation revealed afib burden less than 1%.    The pt is diabetic and does take insulin.  He is on a statin for hypercholesterolemia.  He is on a beta blocker.  He does have OSA.   Past Medical History  Diagnosis Date  . Diabetes mellitus   . COPD (chronic obstructive pulmonary disease)     CPAP  . Renal insufficiency   . Hyperlipemia   . Hypothyroidism   . Hyperkalemia   . 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 Sick sinus syndrome     s/p PPM by JA  . Pancreatitis   . Morbid obesity   . OSA on CPAP     using CPAP although it is hard for him to tolerate  . CAD (coronary artery disease)   . Hypertensive cardiovascular-renal disease   . Depression   . Anxiety   .  H/O Legionnaire's disease 2003  . History of blood clots     R groin  . Peripheral neuropathy   . Osteoarthritis     fingers  . GERD (gastroesophageal reflux disease)   . Anemia     hx of  . Pneumonia 2003  . Cancer 2014    bladder cancer AND RIGHT URETERAL CANCER  . Diastolic dysfunction with chronic heart failure   . Persistent atrial fibrillation   . Atypical atrial flutter   . Venous insufficiency   . Complete heart block     cardioverted from A.Fib,hx. Sick sinus syndrome -Pacemaker implanted  . Pacemaker     implanted [employer] 7'11  . CHF (congestive heart failure)   . Gait difficulty     slow gait"ambulates with walker"    Past Surgical History  Procedure Laterality Date  . Nasal septum surgery  1967  . Pacemaker placement  06/2010    [facility] Accent RF DR, Model [id number] ( Serial number [id number])  . Thromboembolectomy and four compartment fasciotomy  2009    GROIN AREA  . Cataract extraction Right   .  Cholecystectomy  2012  . Insert / replace / remove pacemaker  2011  . Transurethral resection of bladder tumor with gyrus (turbt-gyrus) N/A 10/26/2013    Procedure: TRANSURETHRAL RESECTION OF BLADDER TUMOR WITH GYRUS (TURBT-GYRUS);  Surgeon: Alexis Frock, MD;  Location: [facility];  Service: Urology;  Laterality: N/A;  . Cystoscopy w/ ureteral stent placement Right 10/26/2013    Procedure: CYSTOSCOPY WITH RETROGRADE PYELOGRAM/URETERAL STENT PLACEMENT;  Surgeon: Alexis Frock, MD;  Location: [facility];  Service: Urology;  Laterality: Right;  . Embolectomy Left 11/02/2013    Procedure: LEFT FEMORAL EMBOLECTOMY, LEFT FEMORAL ARTERY ENDARTERECTOMY WITH DACRON PATCH ANGIOPLASTY.;  Surgeon: Mal Misty, MD;  Location: [place];  Service: Vascular;  Laterality: Left;  . Transurethral resection of bladder tumor with gyrus (turbt-gyrus) N/A 01/11/2014    Procedure: TRANSURETHRAL RESECTION OF BLADDER TUMOR WITH GYRUS (TURBT-GYRUS);  Surgeon: Alexis Frock, MD;  Location: [facility];   Service: Urology;  Laterality: N/A;  . Cystoscopy with ureteroscopy and stent placement Right 01/11/2014    Procedure: CYSTOSCOPY WITH URETEROSCOPY ,RIGHT RETROGRADE AND STENT CHANGE AND LASER OF URETERAL TUMOR;  Surgeon: Alexis Frock, MD;  Location: [facility];  Service: Urology;  Laterality: Right;  . Holmium laser application Right 02/08/75    Procedure: HOLMIUM LASER APPLICATION;  Surgeon: Alexis Frock, MD;  Location: [facility];  Service: Urology;  Laterality: Right;  . Tee without cardioversion N/A 05/16/2014    Procedure: TRANSESOPHAGEAL ECHOCARDIOGRAM (TEE);  Surgeon: Josue Hector, MD;  Location: [facility]COPY;  Service: Cardiovascular;  Laterality: N/A;  . Cardioversion N/A 05/16/2014    Procedure: CARDIOVERSION;  Surgeon: Josue Hector, MD;  Location: [facility] ENDOSCOPY;  Service: Cardiovascular;  Laterality: N/A;  . Cardiac catheterization      11-02-13  . Cystoscopy/retrograde/ureteroscopy Right 08/23/2014    Procedure: CYSTOSCOPY/RETROGRADE/ DIAGNOSTIC URETEROSCOPY/RIGHT RENAL STONE EXTRACTION;  Surgeon: Alexis Frock, MD;  Location: [facility];  Service: Urology;  Laterality: Right;  . Cardioversion N/A 11/08/2014    Procedure: CARDIOVERSION;  Surgeon: Fay Records, MD;  Location: [facility] ENDOSCOPY;  Service: Cardiovascular;  Laterality: N/A;    Allergies  Allergen Reactions  . Actos [Pioglitazone] Swelling    Current Outpatient Prescriptions  Medication Sig Dispense Refill  . acetaminophen (TYLENOL) 500 MG tablet Take 500 mg by mouth 2 (two) times daily as needed for moderate pain (pain).     Marland Kitchen albuterol (PROVENTIL HFA;VENTOLIN HFA) 108 (90 BASE) MCG/ACT inhaler Inhale 2 puffs into the lungs every 6 (six) hours as needed for wheezing or shortness of breath. 1 Inhaler 6  . ALPRAZolam (XANAX) 0.25 MG tablet Take 0.25 mg by mouth 3 (three) times daily as needed for anxiety.    Marland Kitchen apixaban (ELIQUIS) 5 MG TABS tablet Take 1 tablet (5 mg total) by mouth 2 (two) times daily. 180 tablet 3  . Ascorbic Acid  (VITAMIN C) 500 MG tablet Take 500 mg by mouth daily.     . brimonidine (ALPHAGAN P) 0.1 % SOLN Place 1 drop into the left eye 3 (three) times daily.     . calcium carbonate (TUMS - DOSED IN MG ELEMENTAL CALCIUM) 500 MG chewable tablet Chew 2 tablets by mouth 2 (two) times daily as needed for indigestion or heartburn.    . cholecalciferol (VITAMIN D) 1000 UNITS tablet Take 1,000 Units by mouth daily.    . CRESTOR 40 MG tablet TAKE 1/2 TABLET EVERY EVENING (Patient taking differently: TAKE 1/2 TABLET BY MOUTH EVERY EVENING) 45 tablet 2  . dextromethorphan (DELSYM) 30 MG/5ML liquid  Take 90 mg by mouth 2 (two) times daily as needed for cough.    . dorzolamide-timolol (COSOPT) 22.3-6.8 MG/ML ophthalmic solution Place 1 drop into both eyes 2 (two) times daily.     . fluticasone (FLONASE) 50 MCG/ACT nasal spray Place 2 sprays into both nostrils daily as needed for allergies or rhinitis.    . furosemide (LASIX) 40 MG tablet Take one tab (40mg ) in the AM, and 1/2 tab (20mg ) in the PM (Patient taking differently: Take one tab (40mg ) by mouth in the AM, and 1/2 tab (20mg ) by mouth in the PM) 135 tablet 3  . guaiFENesin (MUCINEX) 600 MG 12 hr tablet Take 600 mg by mouth 2 (two) times daily as needed for cough or to loosen phlegm.     Marland Kitchen HUMULIN R 500 UNIT/ML SOLN injection DRAW UP 50 UNITS ON U100 SYRINGE (250 ACTUAL INSULIN UNITS) AND INJECT SUBCUTANEOUSLY 4 TIMES DAILY. 80 mL 2  . Lactobacillus (ACIDOPHILUS) CAPS capsule Take 1 capsule by mouth daily.    Marland Kitchen latanoprost (XALATAN) 0.005 % ophthalmic solution Place 1 drop into the left eye at bedtime.     Marland Kitchen levothyroxine (SYNTHROID, LEVOTHROID) 50 MCG tablet TAKE 1 TABLET EVERY DAY BEFORE BREAKFAST (Patient taking differently: TAKE 1 TABLET BY MOUTH EVERY DAY BEFORE BREAKFAST) 90 tablet 3  . metoprolol tartrate (LOPRESSOR) 25 MG tablet Take 1 tablet (25 mg total) by mouth 3 (three) times daily. 270 tablet 3  . Multiple Vitamin (MULTIVITAMIN WITH MINERALS) TABS  tablet Take 1 tablet by mouth daily.    . Omega-3 Fatty Acids (FISH OIL) 1000 MG CAPS Take 1,000 mg by mouth every morning.     Marland Kitchen omeprazole (PRILOSEC) 40 MG capsule TAKE 1 CAPSULE EVERY DAY (Patient taking differently: TAKE 1 CAPSULE BY MOUTH EVERY DAY) 90 capsule 1  . senna-docusate (SENOKOT-S) 8.6-50 MG per tablet Take 1 tablet by mouth 2 (two) times daily. While taking pain meds to prevent constipation. 20 tablet 0  . traMADol (ULTRAM) 50 MG tablet Take 100 mg by mouth every 6 (six) hours as needed (back pain).    Marland Kitchen venlafaxine XR (EFFEXOR-XR) 150 MG 24 hr capsule Take 150 mg by mouth daily as needed (anxiety.).     No current facility-administered medications for this visit.    Family History  Problem Relation Age of Onset  . Liver cancer Mother     deceased age 74  . Cancer Mother     liver cancer  . Colon cancer Neg Hx   . Heart attack Father     History   Social History  . Marital Status: Married    Spouse Name: N/A  . Number of Children: N/A  . 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 Smoking status: Former Smoker -- 2.00 packs/day for 50 years    Types: Cigarettes    Quit date: 12/08/2006  . Smokeless tobacco: Never Used  . Alcohol Use: No     Comment: quit 3 years ago  . Drug Use: No  . Sexual Activity: Not Currently   Other Topics Concern  . Not on file   Social History Narrative     ROS: [x]  Positive   [ ]  Negative   [ ]  All sytems reviewed and are negative  Cardiovascular: []  chest pain/pressure []  palpitations [x]  SOB lying flat [x]  DOE []  pain in legs while walking []  pain in  feet when lying flat []  hx of DVT []  hx of phlebitis [x]  swelling in legs []  varicose veins  Pulmonary: [x]  productive cough []  asthma []  wheezing  Neurologic: []  weakness in []  arms []  legs []  numbness in []  arms []  legs [] difficulty speaking or slurred speech []  temporary loss of vision  in one eye [x]  dizziness  Hematologic: []  bleeding problems []  problems with blood clotting easily  GI []  vomiting blood []  blood in stool  GU: []  burning with urination []  blood in urine  Psychiatric: []  hx of major depression  Integumentary: []  rashes []  ulcers  Constitutional: []  fever []  chills   PHYSICAL EXAMINATION:  Filed Vitals:   04/25/15 1413  BP: 118/71  Pulse: 78  Resp: 18   Body mass index is 44.74 kg/(m^2).  General:  WDWN in NAD Gait: Not observed HENT: WNL, normocephalic Pulmonary: normal non-labored breathing , without Rales, rhonchi,  wheezing Cardiac: RRR, without  Murmurs, rubs or gallops; without carotid bruits Abdomen: soft, NT, no masses Vascular Exam/Pulses: Right groin with pedunculated mass with varicosities at the base.   Extremities: without ischemic changes, without Gangrene , without cellulitis; without open wounds;  Musculoskeletal: no muscle wasting or atrophy  Neurologic: A&O X 3; Appropriate Affect ; SENSATION: normal; MOTOR FUNCTION:  moving all extremities equally. Speech is fluent/normal   Non-Invasive Vascular Imaging:   Arterial duplex 04/25/15: 1.  Patent right common femoral, proximal profunda femoral, and proximal superficial femoral artieris without hemodynamic changes present. 2.  Patent and compressible right common femoral and superficial femoral veins with normal flow present. 3.  No evidence of a pseudoaneurysm present  Pt meds includes: Statin:  Yes.   Beta Blocker:  Yes.   Aspirin:  Yes.   ACEI:  No. ARB:  No. Other Antiplatelet/Anticoagulant:  Yes.   Eliquis   ASSESSMENT/PLAN:: 74 y.o. male with hx of right leg thrombectomy and fasciotomies in 2009 by Dr. Amedeo Plenty.  He did have an angiogram via right femoral artery in 2014 by Dr. Trula Slade.  He had a left femoral embolectomy with dacron patch angioplasty by Dr. Kellie Simmering in 2014. He has been followed by Dr. Tresa Moore for bladder cancer and was referred to Dr. Johney Maine  for right groin mass who referred the pt here for possible pseudoaneurysm.     -the pt has a pedunculated mass with some varicosities at the base of it.  The pt would like to have this removed. -this will most likely be outpatient, but depending on the complexity, he could be admitted overnight. -we will schedule him for surgery on May 08, 2015.  He will stop his Eliquis 2 days prior to surgery.  He was converted to NSR with cardioversion in December 2015.  According to cardiology in March 2016, he was maintaining NSR and is a low afib burden. -he does have a PPM for hx of Sick Sinus Syndrome   Leontine Locket, PA-C [facility] [phone]  Clinic MD:  Pt seen and examined in conjunction with Dr. Scot Dock

## 2015-04-25 NOTE — Telephone Encounter (Addendum)
Error

## 2015-04-30 ENCOUNTER — Ambulatory Visit: Payer: Commercial Managed Care - HMO | Admitting: Family Medicine

## 2015-05-02 ENCOUNTER — Other Ambulatory Visit: Payer: Self-pay

## 2015-05-03 ENCOUNTER — Encounter (HOSPITAL_COMMUNITY): Payer: Self-pay

## 2015-05-03 ENCOUNTER — Telehealth: Payer: Self-pay | Admitting: Endocrinology

## 2015-05-03 ENCOUNTER — Encounter (HOSPITAL_COMMUNITY)
Admission: RE | Admit: 2015-05-03 | Discharge: 2015-05-03 | Disposition: A | Discharge status: Home / Self Care | Payer: Commercial Managed Care - HMO | Source: Ambulatory Visit | Attending: Vascular Surgery | Admitting: Vascular Surgery

## 2015-05-03 DIAGNOSIS — Z87891 Personal history of nicotine dependence: Secondary | ICD-10-CM | POA: Diagnosis not present

## 2015-05-03 DIAGNOSIS — L821 Other seborrheic keratosis: Secondary | ICD-10-CM | POA: Diagnosis not present

## 2015-05-03 DIAGNOSIS — G4733 Obstructive sleep apnea (adult) (pediatric): Secondary | ICD-10-CM | POA: Diagnosis not present

## 2015-05-03 DIAGNOSIS — Z794 Long term (current) use of insulin: Secondary | ICD-10-CM | POA: Diagnosis not present

## 2015-05-03 DIAGNOSIS — E039 Hypothyroidism, unspecified: Secondary | ICD-10-CM | POA: Diagnosis not present

## 2015-05-03 DIAGNOSIS — E119 Type 2 diabetes mellitus without complications: Secondary | ICD-10-CM | POA: Diagnosis not present

## 2015-05-03 DIAGNOSIS — Z950 Presence of cardiac pacemaker: Secondary | ICD-10-CM | POA: Diagnosis not present

## 2015-05-03 DIAGNOSIS — I2510 Atherosclerotic heart disease of native coronary artery without angina pectoris: Secondary | ICD-10-CM | POA: Diagnosis not present

## 2015-05-03 DIAGNOSIS — E780 Pure hypercholesterolemia: Secondary | ICD-10-CM | POA: Diagnosis not present

## 2015-05-03 DIAGNOSIS — J449 Chronic obstructive pulmonary disease, unspecified: Secondary | ICD-10-CM | POA: Diagnosis not present

## 2015-05-03 DIAGNOSIS — E785 Hyperlipidemia, unspecified: Secondary | ICD-10-CM | POA: Diagnosis not present

## 2015-05-03 DIAGNOSIS — R222 Localized swelling, mass and lump, trunk: Secondary | ICD-10-CM | POA: Diagnosis present

## 2015-05-03 LAB — CBC
HCT: 40 % (ref 39.0–52.0)
Hemoglobin: 13 g/dL (ref 13.0–17.0)
MCH: 30 pg (ref 26.0–34.0)
MCHC: 32.5 g/dL (ref 30.0–36.0)
MCV: 92.4 fL (ref 78.0–100.0)
PLATELETS: 250 10*3/uL (ref 150–400)
RBC: 4.33 MIL/uL (ref 4.22–5.81)
RDW: 14.7 % (ref 11.5–15.5)
WBC: 9.3 10*3/uL (ref 4.0–10.5)

## 2015-05-03 LAB — BASIC METABOLIC PANEL
Anion gap: 12 (ref 5–15)
BUN: 20 mg/dL (ref 6–20)
CALCIUM: 9.3 mg/dL (ref 8.9–10.3)
CO2: 23 mmol/L (ref 22–32)
Chloride: 106 mmol/L (ref 101–111)
Creatinine, Ser: 1.37 mg/dL — ABNORMAL HIGH (ref 0.61–1.24)
GFR, EST AFRICAN AMERICAN: 58 mL/min — AB (ref >60)
GFR, EST NON AFRICAN AMERICAN: 50 mL/min — AB (ref >60)
GLUCOSE: 103 mg/dL — AB (ref 65–99)
POTASSIUM: 3.7 mmol/L (ref 3.5–5.1)
Sodium: 141 mmol/L (ref 135–145)

## 2015-05-03 LAB — SURGICAL PCR SCREEN
MRSA, PCR: POSITIVE — AB
STAPHYLOCOCCUS AUREUS: POSITIVE — AB

## 2015-05-03 LAB — GLUCOSE, CAPILLARY: Glucose-Capillary: 153 mg/dL — ABNORMAL HIGH (ref 65–99)

## 2015-05-03 NOTE — Pre-Procedure Instructions (Addendum)
    [name]  05/03/2015        Your procedure is scheduled on Tues, May 31 @ 12:30 AM  Report to [facility]  at 10:30 AM  Call this number if you have problems the morning of surgery:  [phone]   Remember:  Do not eat food or drink liquids after midnight.  Take these medicines the morning of surgery with A SIP OF WATER: Synthroid(Levothyroxine), Metoprolol(Lopressor), Omeprazole(Prilosec), and Pain Pill (if needed).             May use Nasal spray, eye drops and inhaler, Please bring Albuterol inhaler to the hospital with you.               Check with Dr [name] regarding amount of Insulin to take the night before and the morning of surgery.                Stop taking Eliquis 2 days prior to surgery as instructed by MD.                           Stop taking your Fish Oil. No Goody's,BC's,Aleve,Aspirin,Ibuprofen,Fish Oil,or any Herbal Medications.    Do not wear jewelry.  Do not wear lotions, powders, or colognes.   Men may shave face and neck.  Do not bring valuables to the hospital.  [facility] is not responsible for any belongings or valuables.  Contacts, dentures or bridgework may not be worn into surgery.  Leave your suitcase in the car.  After surgery it may be brought to your room.  For patients admitted to the hospital, discharge time will be determined by your treatment team.  Patients discharged the day of surgery will not be allowed to drive home.    Special instructions:  [name] - Preparing for Surgery    Please read over the following fact sheets that you were given. Pain Booklet, Coughing and Deep Breathing, MRSA Information and Surgical Site Infection Prevention, How to Manage Your Diabetes Before and After Surgery and What Do I Do About My Diabetes Medications?

## 2015-05-03 NOTE — Pre-Procedure Instructions (Signed)
[name]  05/03/2015      [name] [place] [address] [place] [address] Phone: [phone] Fax: [phone]  [id number] [place], [place] - [address] [place] [address] Phone: [phone] Fax: [phone]    Your procedure is scheduled on Tues, May 31 @ 10:30 AM  Report to [facility]  at 8:30 AM  Call this number if you have problems the morning of surgery:  [phone]   Remember:  Do not eat food or drink liquids after midnight.  Take these medicines the morning of surgery with A SIP OF WATER:Albuterol<Bring Your Inhaler With You>,Xanax(Alprazolam),Eye Drops,Flonase(Fluticasone),Synthroid(Levothyroxine),Metoprolol(Lopressor),Omeprazole(Prilosec),and Pain Pill(if needed)              Stop taking your Fish Oil and Eliquis. No Goody's,BC's,Aleve,Aspirin,Ibuprofen,Fish Oil,or any Herbal Medications.    Do not wear jewelry.  Do not wear lotions, powders, or colognes.  You may wear deodorant.  Men may shave face and neck.  Do not bring valuables to the hospital.  [facility] is not responsible for any belongings or valuables.  Contacts, dentures or bridgework may not be worn into surgery.  Leave your suitcase in the car.  After surgery it may be brought to your room.  For patients admitted to the hospital, discharge time will be determined by your treatment team.  Patients discharged the day of surgery will not be allowed to drive home.    Special instructions:  [place] - Preparing for Surgery  Before surgery, you can play an important role.  Because skin is not sterile, your skin needs to be as free of germs as possible.  You can reduce the number of germs on you skin by washing with CHG (chlorahexidine gluconate) soap before surgery.  CHG is an antiseptic cleaner which kills germs and bonds with the skin to continue killing  germs even after washing.  Please DO NOT use if you have an allergy to CHG or antibacterial soaps.  If your skin becomes reddened/irritated stop using the CHG and inform your nurse when you arrive at Short Stay.  Do not shave (including legs and underarms) for at least 48 hours prior to the first CHG shower.  You may shave your face.  Please follow these instructions carefully:   1.  Shower with CHG Soap the night before surgery and the                                morning of Surgery.  2.  If you choose to wash your hair, wash your hair first as usual with your       normal shampoo.  3.  After you shampoo, rinse your hair and body thoroughly to remove the                      Shampoo.  4.  Use CHG as you would any other liquid soap.  You can apply chg directly       to the skin and wash gently with scrungie or a clean washcloth.  5.  Apply the CHG Soap to your body ONLY FROM THE NECK DOWN.        Do not use on open wounds or open sores.  Avoid contact with your eyes,  ears, mouth and genitals (private parts).  Wash genitals (private parts)       with your normal soap.  6.  Wash thoroughly, paying special attention to the area where your surgery        will be performed.  7.  Thoroughly rinse your body with warm water from the neck down.  8.  DO NOT shower/wash with your normal soap after using and rinsing off       the CHG Soap.  9.  Pat yourself dry with a clean towel.            10.  Wear clean pajamas.            11.  Place clean sheets on your bed the night of your first shower and do not        sleep with pets.  Day of Surgery  Do not apply any lotions/deoderants the morning of surgery.  Please wear clean clothes to the hospital/surgery center.    Please read over the following fact sheets that you were given. Pain Booklet, Coughing and Deep Breathing, MRSA Information and Surgical Site Infection Prevention

## 2015-05-03 NOTE — Telephone Encounter (Signed)
Opal Sidles called from Provider office needing to know how to adjust U500 med for pt who will be having surgery on 05/08/2015

## 2015-05-03 NOTE — Progress Notes (Signed)
Mr [name] denies any chest pain.  Patient is on U500 Insuliln, "I take 1000 units of Insulin a day."  "The lowest it has been is 42 and the highest is 400."  I asked patient if he knew when he was low or high if he knew of a reason it happened.  " I took to much insulin."  I instructed patient to call Dr [name] to ask him to direct patient on how much Insulin he should take the night before of the morning of surgery, surgery is scheduled at 1234.  "He really doesn't know *how much I take."  I called Dr [name] office and left a message with Elnoria Howard that we instructed patient to call for information on amount of Insulin to take the evening, night and am of surgery and I did not get the feeling that patient is going to do so.  Elnoria Howard said she would give the message to Dr [name] on Friday Am.

## 2015-05-03 NOTE — Progress Notes (Signed)
I called a prescription for Mup[facility]intment to [facility] [address], [place], [place]

## 2015-05-04 ENCOUNTER — Telehealth: Payer: Self-pay | Admitting: Endocrinology

## 2015-05-04 LAB — HEMOGLOBIN A1C
Hgb A1c MFr Bld: 8.7 % — ABNORMAL HIGH (ref 4.8–5.6)
Mean Plasma Glucose: 203 mg/dL

## 2015-05-04 NOTE — Telephone Encounter (Signed)
Pt will have his HgbA1C rechecked when he comes in for his appt on 06/04/2015

## 2015-05-04 NOTE — Progress Notes (Signed)
Anesthesia Chart Review:  Patient is a 74 year old male scheduled for removal of right groin mass on 05/08/15 by Dr. Scot Dock. According to [facility] notes, "pt has a pedunculated mass with some varicosities at the base of it. The pt would like to have this removed."  History includes former smoker, COPD, OSA on CPAP, CRI, glaucoma, hypothyroidism, HLD, SSS s/p St. 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 BMI is consistent with morbid obesity. PCP/Endocrinologist is Dr. [name]. Pulmonologist is Dr. Gwenette Greet. GU is Dr. Tresa Moore.  Cardiologist is Dr. Rayann Heman, last visit 02/26/15 and PPM interrogation only showed afib burden of < 1%..   Meds include albuterol, Xanax, Eliquis, Crestor, Cosopt ophthalmic, Alphagan ophthalmic, Flonase, Lasix, Humulin R 500, Xalatan ophthalmic, levothyroxine, Lopressor, fish oil, Prilosec. Eliquis to be held after 05/05/15 dose.   02/26/15 EKG (done and reviewed by Dr. Rayann Heman): A-paced rhythm, right BBB, T wave abnormality, consider inferolateral ischemia (old). RBB and T wave abnormality does not appear significantly change when compared to prior EKGs on 11/22/15 and 08/21/14.    05/16/14 TEE:  Study Conclusions - Left ventricle: Wall thickness was increased in a pattern of severe LVH. The estimated ejection fraction was 55%. - Aortic valve: There was very mild stenosis. Mean gradient (S): 15 mm Hg. Peak gradient (S): 27 mm Hg. - Mitral valve: Mildly calcified annulus. There was mild regurgitation. - Left atrium: The atrium was dilated. No evidence of thrombus in the atrial  cavity or appendage. - Right atrium: No evidence of thrombus in the atrial cavity or appendage. - Atrial septum: No defect or patent foramen ovale was identified. Echo contrast study showed no right-to-left atrial level shunt, following an increase in RA pressure induced by provocative maneuvers. - Tricuspid valve: No evidence of vegetation. - Pulmonic valve: No evidence of vegetation. - Impressions: No LAA thrombus Proceeded with succesful [facility] under propofol anesthesia. Impressions: - No LAA thrombus Proceeded with succesful [facility] under propofol anesthesia.  05/12/11 Nuclear stress test: Overall Impression: Normal stress nuclear study. EF 62%.  06/15/02 Cardiac cath (Dr. Bing Quarry): ANGIOGRAPHIC DATA: 1. On plain fluoroscopy there was evidence of calcification of the coronary  arteries. This was at least moderate. 2. The left ventriculogram performed in the RAO projection reveals preserved  global systolic function. No segmental abnormalities or contraction  were identified. Ejection fraction was calculated at 64.5%. 3. The left main coronary artery is long. There appears to be calcium  near the ostium. There does not appear to be high grade stenosis. It  is relatively smooth. 4. The left anterior descending artery courses to the apex. In the LAO  projection, there is evidence of about 40% narrowing over a long segment  of the left anterior descending. This does not appear to be critical.  The LAD then bifurcates into a diagonal and LAD proper with about 40%  narrowing in the LAD proper. There is fairly heavy calcification at the  bifurcation. 5. There is a circumflex which provides an intermittent distribution marginal  branch that is free of critical disease. The AV circumflex has mild  luminal irregularity but is free of significant disease. 6. The right coronary artery is a calcified vessel near the ostium.  There  is about  30% narrowing eccentrically proximally with slight haziness which  is probably due to eccentric plaquing and calcification. There is 30-40%  narrowing more distally. The PDA and posterior lateral branch are  without critical disease. CONCLUSIONS: 1. Preserved left ventricular function. 2. Scattered coronary abnormalities as described above.  05/13/14 CXR: Underlying emphysematous change. No edema or consolidation.  RLE Arterial duplex 04/25/15: 1. Patent right common femoral, proximal profunda femoral, and proximal superficial femoral artieris without hemodynamic changes present. 2. Patent and compressible right common femoral and superficial femoral veins with normal flow present. 3. No evidence of a pseudoaneurysm present.  Preoperative labs noted. A1C 8.7. Cr 1.37. CBC WNL.  Patient with recent cardiology follow-up and was overall maintaining SR at that time.  No new tests ordered.  He last stress test was within the past three years, and he has tolerated multiple procedures since 10/2013. Denied chest pain at PAT.  He does have DM with insulin resistance.  Dr. [name] office was contacted regarding giving patient perioperative insulin instructions.  He will get a CBG on arrival.  Further evaluation on the day of surgery to ensure no acute CV/CHF issues.    George Hugh [facility]/Anesthesiology Phone [phone] 05/04/2015 12:11 PM

## 2015-05-07 MED ORDER — DEXTROSE 5 % IV SOLN
1.5000 g | INTRAVENOUS | Status: AC
  Administered 2015-05-08: 1.5 g via INTRAVENOUS
  Filled 2015-05-07: qty 1.5

## 2015-05-07 MED ORDER — CHLORHEXIDINE GLUCONATE 4 % EX LIQD: 60.0000 mL | Freq: Once | CUTANEOUS | Status: DC

## 2015-05-07 MED ORDER — SODIUM CHLORIDE 0.9 % IV SOLN: INTRAVENOUS | Status: DC

## 2015-05-08 ENCOUNTER — Inpatient Hospital Stay (HOSPITAL_COMMUNITY): Payer: Commercial Managed Care - HMO | Admitting: Certified Registered"

## 2015-05-08 ENCOUNTER — Inpatient Hospital Stay (HOSPITAL_COMMUNITY): Payer: Commercial Managed Care - HMO | Admitting: Vascular Surgery

## 2015-05-08 ENCOUNTER — Ambulatory Visit (HOSPITAL_COMMUNITY)
Admission: RE | Admit: 2015-05-08 | Discharge: 2015-05-08 | Disposition: A | Discharge status: Home / Self Care | Payer: Commercial Managed Care - HMO | Source: Ambulatory Visit | Attending: Vascular Surgery | Admitting: Vascular Surgery

## 2015-05-08 ENCOUNTER — Encounter (HOSPITAL_COMMUNITY): Payer: Self-pay | Admitting: *Deleted

## 2015-05-08 ENCOUNTER — Encounter (HOSPITAL_COMMUNITY)
Admission: RE | Disposition: A | Discharge status: Home / Self Care | Payer: Self-pay | Source: Ambulatory Visit | Attending: Vascular Surgery

## 2015-05-08 DIAGNOSIS — E780 Pure hypercholesterolemia: Secondary | ICD-10-CM | POA: Diagnosis not present

## 2015-05-08 DIAGNOSIS — I2510 Atherosclerotic heart disease of native coronary artery without angina pectoris: Secondary | ICD-10-CM | POA: Insufficient documentation

## 2015-05-08 DIAGNOSIS — E119 Type 2 diabetes mellitus without complications: Secondary | ICD-10-CM | POA: Diagnosis not present

## 2015-05-08 DIAGNOSIS — E039 Hypothyroidism, unspecified: Secondary | ICD-10-CM | POA: Insufficient documentation

## 2015-05-08 DIAGNOSIS — J449 Chronic obstructive pulmonary disease, unspecified: Secondary | ICD-10-CM | POA: Insufficient documentation

## 2015-05-08 DIAGNOSIS — Z794 Long term (current) use of insulin: Secondary | ICD-10-CM | POA: Insufficient documentation

## 2015-05-08 DIAGNOSIS — Z87891 Personal history of nicotine dependence: Secondary | ICD-10-CM | POA: Insufficient documentation

## 2015-05-08 DIAGNOSIS — Z950 Presence of cardiac pacemaker: Secondary | ICD-10-CM | POA: Insufficient documentation

## 2015-05-08 DIAGNOSIS — R2240 Localized swelling, mass and lump, unspecified lower limb: Secondary | ICD-10-CM | POA: Diagnosis not present

## 2015-05-08 DIAGNOSIS — Z419 Encounter for procedure for purposes other than remedying health state, unspecified: Secondary | ICD-10-CM

## 2015-05-08 DIAGNOSIS — D487 Neoplasm of uncertain behavior of other specified sites: Secondary | ICD-10-CM | POA: Diagnosis not present

## 2015-05-08 DIAGNOSIS — L821 Other seborrheic keratosis: Secondary | ICD-10-CM | POA: Insufficient documentation

## 2015-05-08 DIAGNOSIS — G4733 Obstructive sleep apnea (adult) (pediatric): Secondary | ICD-10-CM | POA: Diagnosis not present

## 2015-05-08 DIAGNOSIS — E785 Hyperlipidemia, unspecified: Secondary | ICD-10-CM | POA: Insufficient documentation

## 2015-05-08 HISTORY — PX: GROIN DEBRIDEMENT: SHX5159

## 2015-05-08 LAB — POCT I-STAT 4, (NA,K, GLUC, HGB,HCT)
GLUCOSE: 158 mg/dL — AB (ref 65–99)
HEMATOCRIT: 43 % (ref 39.0–52.0)
HEMOGLOBIN: 14.6 g/dL (ref 13.0–17.0)
POTASSIUM: 4.5 mmol/L (ref 3.5–5.1)
SODIUM: 141 mmol/L (ref 135–145)

## 2015-05-08 LAB — GLUCOSE, CAPILLARY
Glucose-Capillary: 148 mg/dL — ABNORMAL HIGH (ref 65–99)
Glucose-Capillary: 88 mg/dL (ref 65–99)
Glucose-Capillary: 83 mg/dL (ref 65–99)

## 2015-05-08 LAB — PROTIME-INR
INR: 1.07 (ref 0.00–1.49)
PROTHROMBIN TIME: 14.1 s (ref 11.6–15.2)

## 2015-05-08 SURGERY — DEBRIDEMENT, INGUINAL REGION
Anesthesia: General | Site: Groin | Laterality: Right

## 2015-05-08 MED ORDER — PROPOFOL 10 MG/ML IV BOLUS
INTRAVENOUS | Status: AC
  Filled 2015-05-08: qty 20

## 2015-05-08 MED ORDER — PROPOFOL 10 MG/ML IV BOLUS
INTRAVENOUS | Status: DC | PRN
  Administered 2015-05-08: 200 mg via INTRAVENOUS

## 2015-05-08 MED ORDER — FENTANYL CITRATE (PF) 100 MCG/2ML IJ SOLN
INTRAMUSCULAR | Status: DC | PRN
  Administered 2015-05-08 (×2): 50 ug via INTRAVENOUS

## 2015-05-08 MED ORDER — LIDOCAINE HCL (CARDIAC) 20 MG/ML IV SOLN
INTRAVENOUS | Status: DC | PRN
  Administered 2015-05-08: 30 mg via INTRAVENOUS

## 2015-05-08 MED ORDER — FENTANYL CITRATE (PF) 100 MCG/2ML IJ SOLN
INTRAMUSCULAR | Status: AC
  Administered 2015-05-08: 50 ug via INTRAVENOUS
  Filled 2015-05-08: qty 2

## 2015-05-08 MED ORDER — 0.9 % SODIUM CHLORIDE (POUR BTL) OPTIME
TOPICAL | Status: DC | PRN
  Administered 2015-05-08: 1000 mL

## 2015-05-08 MED ORDER — OXYCODONE-ACETAMINOPHEN 5-325 MG PO TABS
1.0000 | ORAL_TABLET | Freq: Once | ORAL | Status: AC
  Administered 2015-05-08: 2 via ORAL

## 2015-05-08 MED ORDER — MIDAZOLAM HCL 2 MG/2ML IJ SOLN
INTRAMUSCULAR | Status: AC
  Administered 2015-05-08: 0.5 mg via INTRAVENOUS
  Filled 2015-05-08: qty 2

## 2015-05-08 MED ORDER — MIDAZOLAM HCL 2 MG/2ML IJ SOLN
INTRAMUSCULAR | Status: AC
  Filled 2015-05-08: qty 2

## 2015-05-08 MED ORDER — LIDOCAINE HCL (CARDIAC) 20 MG/ML IV SOLN
INTRAVENOUS | Status: AC
  Filled 2015-05-08: qty 5

## 2015-05-08 MED ORDER — PROMETHAZINE HCL 25 MG/ML IJ SOLN: 6.2500 mg | INTRAMUSCULAR | Status: DC | PRN

## 2015-05-08 MED ORDER — DEXTROSE 50 % IV SOLN
INTRAVENOUS | Status: AC
  Filled 2015-05-08: qty 50

## 2015-05-08 MED ORDER — MEPERIDINE HCL 25 MG/ML IJ SOLN: 6.2500 mg | INTRAMUSCULAR | Status: DC | PRN

## 2015-05-08 MED ORDER — ONDANSETRON HCL 4 MG/2ML IJ SOLN
INTRAMUSCULAR | Status: DC | PRN
  Administered 2015-05-08: 4 mg via INTRAVENOUS

## 2015-05-08 MED ORDER — MIDAZOLAM HCL 2 MG/2ML IJ SOLN
0.5000 mg | Freq: Once | INTRAMUSCULAR | Status: AC | PRN
  Administered 2015-05-08: 0.5 mg via INTRAVENOUS

## 2015-05-08 MED ORDER — DEXTROSE 50 % IV SOLN
25.0000 mL | Freq: Once | INTRAVENOUS | Status: AC
  Administered 2015-05-08: 25 mL via INTRAVENOUS

## 2015-05-08 MED ORDER — ONDANSETRON HCL 4 MG/2ML IJ SOLN
INTRAMUSCULAR | Status: AC
  Filled 2015-05-08: qty 2

## 2015-05-08 MED ORDER — OXYCODONE-ACETAMINOPHEN 5-325 MG PO TABS
ORAL_TABLET | ORAL | Status: AC
  Administered 2015-05-08: 2 via ORAL
  Filled 2015-05-08: qty 2

## 2015-05-08 MED ORDER — FENTANYL CITRATE (PF) 100 MCG/2ML IJ SOLN
25.0000 ug | INTRAMUSCULAR | Status: DC | PRN
  Administered 2015-05-08 (×3): 50 ug via INTRAVENOUS

## 2015-05-08 MED ORDER — FENTANYL CITRATE (PF) 250 MCG/5ML IJ SOLN
INTRAMUSCULAR | Status: AC
  Filled 2015-05-08: qty 5

## 2015-05-08 MED ORDER — LACTATED RINGERS IV SOLN
INTRAVENOUS | Status: DC
  Administered 2015-05-08: 11:00:00 via INTRAVENOUS

## 2015-05-08 MED ORDER — OXYCODONE-ACETAMINOPHEN 5-325 MG PO TABS: 1.0000 | ORAL_TABLET | ORAL | Status: DC | PRN

## 2015-05-08 SURGICAL SUPPLY — 25 items
BNDG GAUZE ELAST 4 BULKY (GAUZE/BANDAGES/DRESSINGS) IMPLANT
CANISTER SUCTION 2500CC (MISCELLANEOUS) ×2 IMPLANT
CONT SPECI 4OZ STER CLIK (MISCELLANEOUS) ×2 IMPLANT
ELECT REM PT RETURN 9FT ADLT (ELECTROSURGICAL) ×2
ELECTRODE REM PT RTRN 9FT ADLT (ELECTROSURGICAL) ×1 IMPLANT
GAUZE SPONGE 4X4 12PLY STRL (GAUZE/BANDAGES/DRESSINGS) ×2 IMPLANT
GLOVE BIO SURGEON STRL SZ7.5 (GLOVE) ×2 IMPLANT
GLOVE BIOGEL PI IND STRL 8 (GLOVE) ×1 IMPLANT
GLOVE BIOGEL PI INDICATOR 8 (GLOVE) ×1
GOWN STRL REUS W/ TWL LRG LVL3 (GOWN DISPOSABLE) ×3 IMPLANT
GOWN STRL REUS W/TWL LRG LVL3 (GOWN DISPOSABLE) ×3
KIT BASIN OR (CUSTOM PROCEDURE TRAY) ×2 IMPLANT
KIT ROOM TURNOVER OR (KITS) ×2 IMPLANT
LIQUID BAND (GAUZE/BANDAGES/DRESSINGS) ×2 IMPLANT
NS IRRIG 1000ML POUR BTL (IV SOLUTION) ×2 IMPLANT
PACK GENERAL/GYN (CUSTOM PROCEDURE TRAY) ×2 IMPLANT
PACK UNIVERSAL I (CUSTOM PROCEDURE TRAY) ×2 IMPLANT
PAD ARMBOARD 7.5X6 YLW CONV (MISCELLANEOUS) ×4 IMPLANT
STAPLER VISISTAT (STAPLE) IMPLANT
SUT ETHILON 3 0 PS 1 (SUTURE) IMPLANT
SUT VIC AB 2-0 CTB1 (SUTURE) IMPLANT
SUT VIC AB 3-0 SH 27 (SUTURE)
SUT VIC AB 3-0 SH 27X BRD (SUTURE) IMPLANT
SUT VICRYL 4-0 PS2 18IN ABS (SUTURE) IMPLANT
WATER STERILE IRR 1000ML POUR (IV SOLUTION) ×2 IMPLANT

## 2015-05-08 NOTE — H&P (View-Only) (Signed)
HISTORY AND PHYSICAL     CC:  Ultrasound for mass in right groin Referring Provider:  Renato Shin, MD  HPI: This is a 74 y.o. male who was being seen by Dr. Johney Maine earlier today and was referred to Dr. Scot Dock for possible pseudoaneurysm.    He has been followed by Dr. Tresa Moore for bladder cancer and has undergone multiple surgeries by him.  He mentioned this growth to Dr. Tresa Moore and he was referred to surgery who referred the pt here today.  The pt states that about 6-8 months ago, he noticed a small area that looked like a wart or a mole.  Over the past few months it has gotten bigger.  Over the past several weeks, it has gotten even bigger more quickly.    The pt has a hx of right leg thrombectomy and fasciotomies in 2009 by Dr. Amedeo Plenty.  He did have an angiogram via right femoral artery in 2014 by Dr. Trula Slade.  He had a left femoral embolectomy with dacron patch angioplasty by Dr. Kellie Simmering in 2014.  He has returned to office only once since then for his postoperative visit.  He has concerns that this mass is arising from his right groin incision.    He is on Eliquis for Afib and hx of clots per pt.  He was seen by cardiology in March who noted the pt to have been cardioverted to [facility] December 2015 and PPM interrogation revealed afib burden less than 1%.    The pt is diabetic and does take insulin.  He is on a statin for hypercholesterolemia.  He is on a beta blocker.  He does have OSA.   Past Medical History  Diagnosis Date  . Diabetes mellitus   . COPD (chronic obstructive pulmonary disease)     CPAP  . Renal insufficiency   . Hyperlipemia   . Hypothyroidism   . Hyperkalemia   . 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 Sick sinus syndrome     s/p PPM by JA  . Pancreatitis   . Morbid obesity   . OSA on CPAP     using CPAP although it is hard for him to tolerate  . CAD (coronary artery disease)   . Hypertensive cardiovascular-renal disease   . Depression   . Anxiety   .  H/O Legionnaire's disease 2003  . History of blood clots     R groin  . Peripheral neuropathy   . Osteoarthritis     fingers  . GERD (gastroesophageal reflux disease)   . Anemia     hx of  . Pneumonia 2003  . Cancer 2014    bladder cancer AND RIGHT URETERAL CANCER  . Diastolic dysfunction with chronic heart failure   . Persistent atrial fibrillation   . Atypical atrial flutter   . Venous insufficiency   . Complete heart block     cardioverted from A.Fib,hx. Sick sinus syndrome -Pacemaker implanted  . Pacemaker     implanted St. Jude 7'11  . CHF (congestive heart failure)   . Gait difficulty     slow gait"ambulates with walker"    Past Surgical History  Procedure Laterality Date  . Nasal septum surgery  1967  . Pacemaker placement  06/2010    [facility] [employer] RF DR, Model [id number] ( Serial number [id number])  . Thromboembolectomy and four compartment fasciotomy  2009    GROIN AREA  . Cataract extraction Right   .  Cholecystectomy  2012  . Insert / replace / remove pacemaker  2011  . Transurethral resection of bladder tumor with gyrus (turbt-gyrus) N/A 10/26/2013    Procedure: TRANSURETHRAL RESECTION OF BLADDER TUMOR WITH GYRUS (TURBT-GYRUS);  Surgeon: Alexis Frock, MD;  Location: [facility];  Service: Urology;  Laterality: N/A;  . Cystoscopy w/ ureteral stent placement Right 10/26/2013    Procedure: CYSTOSCOPY WITH RETROGRADE PYELOGRAM/URETERAL STENT PLACEMENT;  Surgeon: Alexis Frock, MD;  Location: [facility];  Service: Urology;  Laterality: Right;  . Embolectomy Left 11/02/2013    Procedure: LEFT FEMORAL EMBOLECTOMY, LEFT FEMORAL ARTERY ENDARTERECTOMY WITH DACRON PATCH ANGIOPLASTY.;  Surgeon: Mal Misty, MD;  Location: [place];  Service: Vascular;  Laterality: Left;  . Transurethral resection of bladder tumor with gyrus (turbt-gyrus) N/A 01/11/2014    Procedure: TRANSURETHRAL RESECTION OF BLADDER TUMOR WITH GYRUS (TURBT-GYRUS);  Surgeon: Alexis Frock, MD;  Location: [facility];   Service: Urology;  Laterality: N/A;  . Cystoscopy with ureteroscopy and stent placement Right 01/11/2014    Procedure: CYSTOSCOPY WITH URETEROSCOPY ,RIGHT RETROGRADE AND STENT CHANGE AND LASER OF URETERAL TUMOR;  Surgeon: Alexis Frock, MD;  Location: [facility];  Service: Urology;  Laterality: Right;  . Holmium laser application Right 03/12/8098    Procedure: HOLMIUM LASER APPLICATION;  Surgeon: Alexis Frock, MD;  Location: [facility];  Service: Urology;  Laterality: Right;  . Tee without cardioversion N/A 05/16/2014    Procedure: TRANSESOPHAGEAL ECHOCARDIOGRAM (TEE);  Surgeon: Josue Hector, MD;  Location: [facility] ENDOSCOPY;  Service: Cardiovascular;  Laterality: N/A;  . Cardioversion N/A 05/16/2014    Procedure: CARDIOVERSION;  Surgeon: Josue Hector, MD;  Location: [facility] ENDOSCOPY;  Service: Cardiovascular;  Laterality: N/A;  . Cardiac catheterization      11-02-13  . Cystoscopy/retrograde/ureteroscopy Right 08/23/2014    Procedure: CYSTOSCOPY/RETROGRADE/ DIAGNOSTIC URETEROSCOPY/RIGHT RENAL STONE EXTRACTION;  Surgeon: Alexis Frock, MD;  Location: [facility];  Service: Urology;  Laterality: Right;  . Cardioversion N/A 11/08/2014    Procedure: CARDIOVERSION;  Surgeon: Fay Records, MD;  Location: [facility] ENDOSCOPY;  Service: Cardiovascular;  Laterality: N/A;    Allergies  Allergen Reactions  . Actos [Pioglitazone] Swelling    Current Outpatient Prescriptions  Medication Sig Dispense Refill  . acetaminophen (TYLENOL) 500 MG tablet Take 500 mg by mouth 2 (two) times daily as needed for moderate pain (pain).     Marland Kitchen albuterol (PROVENTIL HFA;VENTOLIN HFA) 108 (90 BASE) MCG/ACT inhaler Inhale 2 puffs into the lungs every 6 (six) hours as needed for wheezing or shortness of breath. 1 Inhaler 6  . ALPRAZolam (XANAX) 0.25 MG tablet Take 0.25 mg by mouth 3 (three) times daily as needed for anxiety.    Marland Kitchen apixaban (ELIQUIS) 5 MG TABS tablet Take 1 tablet (5 mg total) by mouth 2 (two) times daily. 180 tablet 3  . Ascorbic Acid  (VITAMIN C) 500 MG tablet Take 500 mg by mouth daily.     . brimonidine (ALPHAGAN P) 0.1 % SOLN Place 1 drop into the left eye 3 (three) times daily.     . calcium carbonate (TUMS - DOSED IN MG ELEMENTAL CALCIUM) 500 MG chewable tablet Chew 2 tablets by mouth 2 (two) times daily as needed for indigestion or heartburn.    . cholecalciferol (VITAMIN D) 1000 UNITS tablet Take 1,000 Units by mouth daily.    . CRESTOR 40 MG tablet TAKE 1/2 TABLET EVERY EVENING (Patient taking differently: TAKE 1/2 TABLET BY MOUTH EVERY EVENING) 45 tablet 2  . dextromethorphan (DELSYM) 30 MG/5ML liquid  Take 90 mg by mouth 2 (two) times daily as needed for cough.    . dorzolamide-timolol (COSOPT) 22.3-6.8 MG/ML ophthalmic solution Place 1 drop into both eyes 2 (two) times daily.     . fluticasone (FLONASE) 50 MCG/ACT nasal spray Place 2 sprays into both nostrils daily as needed for allergies or rhinitis.    . furosemide (LASIX) 40 MG tablet Take one tab (40mg ) in the AM, and 1/2 tab (20mg ) in the PM (Patient taking differently: Take one tab (40mg ) by mouth in the AM, and 1/2 tab (20mg ) by mouth in the PM) 135 tablet 3  . guaiFENesin (MUCINEX) 600 MG 12 hr tablet Take 600 mg by mouth 2 (two) times daily as needed for cough or to loosen phlegm.     Marland Kitchen HUMULIN R 500 UNIT/ML SOLN injection DRAW UP 50 UNITS ON U100 SYRINGE (250 ACTUAL INSULIN UNITS) AND INJECT SUBCUTANEOUSLY 4 TIMES DAILY. 80 mL 2  . Lactobacillus (ACIDOPHILUS) CAPS capsule Take 1 capsule by mouth daily.    Marland Kitchen latanoprost (XALATAN) 0.005 % ophthalmic solution Place 1 drop into the left eye at bedtime.     Marland Kitchen levothyroxine (SYNTHROID, LEVOTHROID) 50 MCG tablet TAKE 1 TABLET EVERY DAY BEFORE BREAKFAST (Patient taking differently: TAKE 1 TABLET BY MOUTH EVERY DAY BEFORE BREAKFAST) 90 tablet 3  . metoprolol tartrate (LOPRESSOR) 25 MG tablet Take 1 tablet (25 mg total) by mouth 3 (three) times daily. 270 tablet 3  . Multiple Vitamin (MULTIVITAMIN WITH MINERALS) TABS  tablet Take 1 tablet by mouth daily.    . Omega-3 Fatty Acids (FISH OIL) 1000 MG CAPS Take 1,000 mg by mouth every morning.     Marland Kitchen omeprazole (PRILOSEC) 40 MG capsule TAKE 1 CAPSULE EVERY DAY (Patient taking differently: TAKE 1 CAPSULE BY MOUTH EVERY DAY) 90 capsule 1  . senna-docusate (SENOKOT-S) 8.6-50 MG per tablet Take 1 tablet by mouth 2 (two) times daily. While taking pain meds to prevent constipation. 20 tablet 0  . traMADol (ULTRAM) 50 MG tablet Take 100 mg by mouth every 6 (six) hours as needed (back pain).    Marland Kitchen venlafaxine XR (EFFEXOR-XR) 150 MG 24 hr capsule Take 150 mg by mouth daily as needed (anxiety.).     No current facility-administered medications for this visit.    Family History  Problem Relation Age of Onset  . Liver cancer Mother     deceased age 48  . Cancer Mother     liver cancer  . Colon cancer Neg Hx   . Heart attack Father     History   Social History  . Marital Status: Married    Spouse Name: N/A  . Number of Children: N/A  . 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 Smoking status: Former Smoker -- 2.00 packs/day for 50 years    Types: Cigarettes    Quit date: 12/08/2006  . Smokeless tobacco: Never Used  . Alcohol Use: No     Comment: quit 3 years ago  . Drug Use: No  . Sexual Activity: Not Currently   Other Topics Concern  . Not on file   Social History Narrative     ROS: [x]  Positive   [ ]  Negative   [ ]  All sytems reviewed and are negative  Cardiovascular: []  chest pain/pressure []  palpitations [x]  SOB lying flat [x]  DOE []  pain in legs while walking []  pain in  feet when lying flat []  hx of DVT []  hx of phlebitis [x]  swelling in legs []  varicose veins  Pulmonary: [x]  productive cough []  asthma []  wheezing  Neurologic: []  weakness in []  arms []  legs []  numbness in []  arms []  legs [] difficulty speaking or slurred speech []  temporary loss of vision  in one eye [x]  dizziness  Hematologic: []  bleeding problems []  problems with blood clotting easily  GI []  vomiting blood []  blood in stool  GU: []  burning with urination []  blood in urine  Psychiatric: []  hx of major depression  Integumentary: []  rashes []  ulcers  Constitutional: []  fever []  chills   PHYSICAL EXAMINATION:  Filed Vitals:   04/25/15 1413  BP: 118/71  Pulse: 78  Resp: 18   Body mass index is 44.74 kg/(m^2).  General:  WDWN in NAD Gait: Not observed HENT: WNL, normocephalic Pulmonary: normal non-labored breathing , without Rales, rhonchi,  wheezing Cardiac: RRR, without  Murmurs, rubs or gallops; without carotid bruits Abdomen: soft, NT, no masses Vascular Exam/Pulses: Right groin with pedunculated mass with varicosities at the base.   Extremities: without ischemic changes, without Gangrene , without cellulitis; without open wounds;  Musculoskeletal: no muscle wasting or atrophy  Neurologic: A&O X 3; Appropriate Affect ; SENSATION: normal; MOTOR FUNCTION:  moving all extremities equally. Speech is fluent/normal   Non-Invasive Vascular Imaging:   Arterial duplex 04/25/15: 1.  Patent right common femoral, proximal profunda femoral, and proximal superficial femoral artieris without hemodynamic changes present. 2.  Patent and compressible right common femoral and superficial femoral veins with normal flow present. 3.  No evidence of a pseudoaneurysm present  Pt meds includes: Statin:  Yes.   Beta Blocker:  Yes.   Aspirin:  Yes.   ACEI:  No. ARB:  No. Other Antiplatelet/Anticoagulant:  Yes.   Eliquis   ASSESSMENT/PLAN:: 74 y.o. male with hx of right leg thrombectomy and fasciotomies in 2009 by Dr. Amedeo Plenty.  He did have an angiogram via right femoral artery in 2014 by Dr. Trula Slade.  He had a left femoral embolectomy with dacron patch angioplasty by Dr. Kellie Simmering in 2014. He has been followed by Dr. Tresa Moore for bladder cancer and was referred to Dr. Johney Maine  for right groin mass who referred the pt here for possible pseudoaneurysm.     -the pt has a pedunculated mass with some varicosities at the base of it.  The pt would like to have this removed. -this will most likely be outpatient, but depending on the complexity, he could be admitted overnight. -we will schedule him for surgery on May 08, 2015.  He will stop his Eliquis 2 days prior to surgery.  He was converted to NSR with cardioversion in December 2015.  According to cardiology in March 2016, he was maintaining NSR and is a low afib burden. -he does have a PPM for hx of Sick Sinus Syndrome   Leontine Locket, PA-C [facility] [phone]  Clinic MD:  Pt seen and examined in conjunction with Dr. Scot Dock

## 2015-05-08 NOTE — Anesthesia Postprocedure Evaluation (Signed)
  Anesthesia Post-op Note  Patient: [name]  Procedure(s) Performed: Procedure(s): REMOVAL OF RIGHT GROIN MASS (Right)  Patient Location: PACU  Anesthesia Type:General  Level of Consciousness: awake, alert , oriented and patient cooperative  Airway and Oxygen Therapy: Patient Spontanous Breathing and Patient connected to nasal cannula oxygen  Post-op Pain: mild  Post-op Assessment: Post-op Vital signs reviewed, Patient's Cardiovascular Status Stable, Respiratory Function Stable, Patent Airway, No signs of Nausea or vomiting and Pain level controlled  Post-op Vital Signs: Reviewed and stable  Last Vitals:  Filed Vitals:   05/08/15 1545  BP: 108/63  Pulse: 70  Temp:   Resp: 12    Complications: No apparent anesthesia complications

## 2015-05-08 NOTE — Op Note (Signed)
    NAME: [name]   MRN: [id number] DOB: Oct 05, 1941    DATE OF OPERATION: 05/08/2015  PREOP DIAGNOSIS: right groin mass  POSTOP DIAGNOSIS: same  PROCEDURE: Excision of right groin mass  SURGEON: Judeth Cornfield. Scot Dock, MD, FACS  ASSIST: Dineen Kid, RNFA  ANESTHESIA: Gen.   EBL: minimal  INDICATIONS: [name] is a 74 y.o. male had a fungating mass just below the inguinal crease on the right. This had some varicose veins present within it and for this reason vascular surgery was consult for removal.  FINDINGS: there was no vascular structures that were feeding the mass. There were some varicose veins within the mass. The mass was sent to pathology.  TECHNIQUE: The patient was taken to the operating room and received a general study. The right groin was prepped and draped in usual sterile fashion. An elliptical incision was made encompassing the mass and the mass was excised using electrocautery. There was some small varicose veins which were cauterized. There were no large vessels feeding the mass. Hemostasis was obtained in the wound. The wound was closed with a deep layer of 3-0 Vicryl and the skin closed with 4-0 Vicryl. Liquiband was applied. The patient tolerated the procedure well and was transferred to the recovery room in stable condition. All needle and sponge counts were correct.  Deitra Mayo, MD, FACS [facility]  DATE OF DICTATION:   05/08/2015

## 2015-05-08 NOTE — Transfer of Care (Signed)
Immediate Anesthesia Transfer of Care Note  Patient: [name]  Procedure(s) Performed: Procedure(s): REMOVAL OF RIGHT GROIN MASS (Right)  Patient Location: PACU  Anesthesia Type:General  Level of Consciousness: awake  Airway & Oxygen Therapy: Patient Spontanous Breathing and Patient connected to nasal cannula oxygen  Post-op Assessment: Report given to RN  Post vital signs: Reviewed and stable  Last Vitals:  Filed Vitals:   05/08/15 1054  BP: 138/57  Pulse: 70  Temp: 36.6 C  Resp: 20    Complications: No apparent anesthesia complications

## 2015-05-08 NOTE — Anesthesia Procedure Notes (Signed)
Procedure Name: LMA Insertion Date/Time: 05/08/2015 1:29 PM Performed by: Sampson Si E Pre-anesthesia Checklist: Patient identified, Emergency Drugs available, Suction available, Patient being monitored and Timeout performed Patient Re-evaluated:Patient Re-evaluated prior to inductionOxygen Delivery Method: Circle system utilized Preoxygenation: Pre-oxygenation with 100% oxygen Intubation Type: IV induction LMA: LMA inserted LMA Size: 5.0 Number of attempts: 1 Placement Confirmation: positive ETCO2 and breath sounds checked- equal and bilateral Tube secured with: Tape Dental Injury: Teeth and Oropharynx as per pre-operative assessment

## 2015-05-08 NOTE — Anesthesia Preprocedure Evaluation (Addendum)
Anesthesia Evaluation  Patient identified by MRN, date of birth, ID band Patient awake    Reviewed: Allergy & Precautions, NPO status , Patient's Chart, lab work & pertinent test results, reviewed documented beta blocker date and time   Airway Mallampati: II  TM Distance: >3 FB Neck ROM: Full    Dental  (+) Chipped, Dental Advisory Given   Pulmonary sleep apnea and Continuous Positive Airway Pressure Ventilation , COPD COPD inhaler, former smoker (quit '08),  breath sounds clear to auscultation        Cardiovascular hypertension, Pt. on medications and Pt. on home beta blockers - angina+ Peripheral Vascular Disease + dysrhythmias Atrial Fibrillation + pacemaker (for sick sinus) Rhythm:Regular Rate:Normal  '15 ECHO: severe LVH, EF 55%, mild AS, mild MR '12 stress test: normal perfusion, no ischemia    Neuro/Psych Anxiety Depression negative neurological ROS     GI/Hepatic GERD-  Medicated and Controlled,  Endo/Other  diabetes (glu 88), Insulin DependentHypothyroidism Morbid obesity  Renal/GU Renal InsufficiencyRenal disease (creat 1.37)   Bladder cancer    Musculoskeletal  (+) Arthritis -,   Abdominal (+) + obese,   Peds  Hematology   Anesthesia Other Findings   Reproductive/Obstetrics                           Anesthesia Physical Anesthesia Plan  ASA: III  Anesthesia Plan: General   Post-op Pain Management:    Induction: Intravenous  Airway Management Planned: LMA  Additional Equipment:   Intra-op Plan:   Post-operative Plan:   Informed Consent: I have reviewed the patients History and Physical, chart, labs and discussed the procedure including the risks, benefits and alternatives for the proposed anesthesia with the patient or authorized representative who has indicated his/her understanding and acceptance.   Dental advisory given  Plan Discussed with: CRNA and  Surgeon  Anesthesia Plan Comments: (Plan routine monitors, GA- LMA OK)        Anesthesia Quick Evaluation

## 2015-05-08 NOTE — Interval H&P Note (Signed)
History and Physical Interval Note:  05/08/2015 12:59 PM  [name]  has presented today for surgery, with the diagnosis of Right Groin Mass   The various methods of treatment have been discussed with the patient and family. After consideration of risks, benefits and other options for treatment, the patient has consented to  Procedure(s): REMOVAL OF RIGHT GROIN MASS (Right) as a surgical intervention .  The patient's history has been reviewed, patient examined, no change in status, stable for surgery.  I have reviewed the patient's chart and labs.  Questions were answered to the patient's satisfaction.     Deitra Mayo

## 2015-05-09 ENCOUNTER — Other Ambulatory Visit: Payer: Self-pay

## 2015-05-09 ENCOUNTER — Encounter (HOSPITAL_COMMUNITY): Payer: Self-pay | Admitting: Vascular Surgery

## 2015-05-09 DIAGNOSIS — B49 Unspecified mycosis: Secondary | ICD-10-CM

## 2015-05-09 MED ORDER — NYSTATIN 100000 UNIT/GM EX CREA: 1.0000 "application " | TOPICAL_CREAM | Freq: Two times a day (BID) | CUTANEOUS | Status: DC

## 2015-05-10 ENCOUNTER — Other Ambulatory Visit: Payer: Self-pay | Admitting: Internal Medicine

## 2015-05-10 ENCOUNTER — Telehealth: Payer: Self-pay | Admitting: Vascular Surgery

## 2015-05-10 NOTE — Telephone Encounter (Signed)
Spoke with pt to schedule follow up, dpm

## 2015-05-10 NOTE — Telephone Encounter (Signed)
-----   Message from Mena Goes, RN sent at 05/08/2015  2:16 PM EDT ----- Regarding: Schedule   ----- Message -----    From: Angelia Mould, MD    Sent: 05/08/2015   2:12 PM      To: [facility] Charge Pool Subject: charge                                         PROCEDURE: Excision of right groin mass  SURGEON: Judeth Cornfield. Scot Dock, MD, FACS  ASSIST: Dineen Kid, RNFA  He will need a follow up visit in 2 weeks. Thank you. CD

## 2015-05-17 ENCOUNTER — Ambulatory Visit (INDEPENDENT_AMBULATORY_CARE_PROVIDER_SITE_OTHER): Payer: Commercial Managed Care - HMO | Admitting: Endocrinology

## 2015-05-17 ENCOUNTER — Telehealth: Payer: Self-pay | Admitting: Endocrinology

## 2015-05-17 ENCOUNTER — Encounter: Payer: Self-pay | Admitting: Endocrinology

## 2015-05-17 VITALS — BP 142/84 | HR 69 | Temp 97.8°F | Wt 312.0 lb

## 2015-05-17 DIAGNOSIS — E1065 Type 1 diabetes mellitus with hyperglycemia: Principal | ICD-10-CM

## 2015-05-17 DIAGNOSIS — E1029 Type 1 diabetes mellitus with other diabetic kidney complication: Secondary | ICD-10-CM | POA: Diagnosis not present

## 2015-05-17 DIAGNOSIS — IMO0002 Reserved for concepts with insufficient information to code with codable children: Secondary | ICD-10-CM

## 2015-05-17 LAB — HEMOGLOBIN A1C: Hgb A1c MFr Bld: 8.2 % — ABNORMAL HIGH (ref 4.6–6.5)

## 2015-05-17 MED ORDER — INSULIN REGULAR HUMAN 100 UNIT/ML IJ SOLN: 250.0000 [IU] | Freq: Three times a day (TID) | INTRAMUSCULAR | Status: DC

## 2015-05-17 NOTE — Patient Instructions (Addendum)
For now, please take just 250 units 3 times a day (just before each meal), and 100 units with your bedtime snack.  Please take these numbers, no matter what your blood sugar is.  Here are several printed prescriptions.   blood tests are being requested for you today.  We'll let you know about the results. Please come back for a follow-up appointment in 3 months.

## 2015-05-17 NOTE — Telephone Encounter (Signed)
Pt has decided not to change the insulin yet. The prices of the others are not favorable

## 2015-05-17 NOTE — Progress Notes (Signed)
Subjective:    Patient ID: [name], male    DOB: January 11, 1941, 74 y.o.   MRN: [id number]  HPI Pt returns for f/u of diabetes mellitus: DM type: Insulin-requiring type 2 Dx'ed: 6144 Complications: renal insufficiency, polyneuropathy, and CAD.   Therapy: insulin since 1989.  DKA: never Severe hypoglycemia: never Pancreatitis: several episodes, most recently in 2013 Other: he has severe insulin resistance; he takes multiple daily injections of U-500; variable cbg's have limited efforts at glycemic control.   Interval history: he brings a record of his cbg's which I have reviewed today.  It varies from 42-348, but most are in the 100's.  It is in general highest at lunch.  He varies insulin dosing from 200-300 units (depending on cbg), 3 times a day (just before each meal), and 100 units with a bedtime snack.  He says he cannot sleep unless he eats at hs).  Past Medical History  Diagnosis Date  . COPD (chronic obstructive pulmonary disease)     CPAP  . Renal insufficiency   . Hyperlipemia   . Hypothyroidism   . Hyperkalemia   . 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 Sick sinus syndrome     s/p PPM by JA  . Pancreatitis   . Morbid obesity   . OSA on CPAP     using CPAP although it is hard for him to tolerate  . CAD (coronary artery disease)   . Hypertensive cardiovascular-renal disease   . Depression   . Anxiety   . H/O Legionnaire's disease 2003  . History of blood clots     R groin  . Peripheral neuropathy   . Osteoarthritis     fingers  . GERD (gastroesophageal reflux disease)   . Anemia     hx of  . Pneumonia 2003  . Cancer 2014    bladder cancer AND RIGHT URETERAL CANCER  . Diastolic dysfunction with chronic heart failure   . Persistent atrial fibrillation   . Atypical atrial flutter   . Venous insufficiency   . Complete heart block     cardioverted from A.Fib,hx. Sick sinus syndrome -Pacemaker implanted  . Pacemaker     implanted [employer] 7'11    . CHF (congestive heart failure)   . Gait difficulty     slow gait"ambulates with walker"  . Shortness of breath dyspnea     with exertion  . Diabetes mellitus     TypeII  . History of kidney stones     Past Surgical History  Procedure Laterality Date  . Nasal septum surgery  1967  . Pacemaker placement  06/2010    [facility] Accent RF DR, Model [id number] ( Serial number [id number])  . Thromboembolectomy and four compartment fasciotomy Right 2009    leg  . Cholecystectomy  2012  . Insert / replace / remove pacemaker  2011  . Transurethral resection of bladder tumor with gyrus (turbt-gyrus) N/A 10/26/2013    Procedure: TRANSURETHRAL RESECTION OF BLADDER TUMOR WITH GYRUS (TURBT-GYRUS);  Surgeon: Alexis Frock, MD;  Location: [facility];  Service: Urology;  Laterality: N/A;  . Cystoscopy w/ ureteral stent placement Right 10/26/2013    Procedure: CYSTOSCOPY WITH RETROGRADE PYELOGRAM/URETERAL STENT PLACEMENT;  Surgeon: Alexis Frock, MD;  Location: [facility];  Service: Urology;  Laterality: Right;  . Embolectomy Left 11/02/2013    Procedure: LEFT FEMORAL EMBOLECTOMY, LEFT FEMORAL ARTERY ENDARTERECTOMY WITH DACRON PATCH ANGIOPLASTY.;  Surgeon: Mal Misty,  MD;  Location: [facility];  Service: Vascular;  Laterality: Left;  . Transurethral resection of bladder tumor with gyrus (turbt-gyrus) N/A 01/11/2014    Procedure: TRANSURETHRAL RESECTION OF BLADDER TUMOR WITH GYRUS (TURBT-GYRUS);  Surgeon: Alexis Frock, MD;  Location: [facility];  Service: Urology;  Laterality: N/A;  . Cystoscopy with ureteroscopy and stent placement Right 01/11/2014    Procedure: CYSTOSCOPY WITH URETEROSCOPY ,RIGHT RETROGRADE AND STENT CHANGE AND LASER OF URETERAL TUMOR;  Surgeon: Alexis Frock, MD;  Location: [facility];  Service: Urology;  Laterality: Right;  . Holmium laser application Right 12/13/3844    Procedure: HOLMIUM LASER APPLICATION;  Surgeon: Alexis Frock, MD;  Location: [facility];  Service: Urology;  Laterality: Right;  .  Tee without cardioversion N/A 05/16/2014    Procedure: TRANSESOPHAGEAL ECHOCARDIOGRAM (TEE);  Surgeon: Josue Hector, MD;  Location: [facility];  Service: Cardiovascular;  Laterality: N/A;  . Cardioversion N/A 05/16/2014    Procedure: CARDIOVERSION;  Surgeon: Josue Hector, MD;  Location: [facility]COPY;  Service: Cardiovascular;  Laterality: N/A;  . Cardiac catheterization      11-02-13  . Cystoscopy/retrograde/ureteroscopy Right 08/23/2014    Procedure: CYSTOSCOPY/RETROGRADE/ DIAGNOSTIC URETEROSCOPY/RIGHT RENAL STONE EXTRACTION;  Surgeon: Alexis Frock, MD;  Location: [facility];  Service: Urology;  Laterality: Right;  . Cardioversion N/A 11/08/2014    Procedure: CARDIOVERSION;  Surgeon: Fay Records, MD;  Location: [place];  Service: Cardiovascular;  Laterality: N/A;  . Eye surgery Left     cataract  . Belpharoptosis repair Right     Glaucoma  . Wisdom tooth extraction    . Groin debridement Right 05/08/2015    Procedure: REMOVAL OF RIGHT GROIN MASS;  Surgeon: Angelia Mould, MD;  Location: [place];  Service: Vascular;  Laterality: Right;    History   Social History  . Marital Status: Married    Spouse Name: N/A  . Number of Children: N/A  . 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 Smoking status: Former Smoker -- 2.00 packs/day for 50 years    Types: Cigarettes    Quit date: 12/08/2006  . Smokeless tobacco: Never Used  . Alcohol Use: No     Comment: quit 3 years ago  . Drug Use: No  . Sexual Activity: Not Currently   Other Topics Concern  . Not on file   Social History Narrative    Current Outpatient Prescriptions on File Prior to Visit  Medication Sig Dispense Refill  . acetaminophen (TYLENOL) 500 MG tablet Take 500 mg by mouth 2 (two) times daily as needed for moderate pain (pain).     Marland Kitchen albuterol (PROVENTIL HFA;VENTOLIN HFA) 108 (90 BASE) MCG/ACT inhaler Inhale 2 puffs into the lungs  every 6 (six) hours as needed for wheezing or shortness of breath. 1 Inhaler 6  . ALPRAZolam (XANAX) 0.25 MG tablet Take 0.25 mg by mouth 3 (three) times daily as needed for anxiety.    . Ascorbic Acid (VITAMIN C) 500 MG tablet Take 500 mg by mouth daily.     . brimonidine (ALPHAGAN P) 0.1 % SOLN Place 1 drop into the left eye 3 (three) times daily.     . calcium carbonate (TUMS - DOSED IN MG ELEMENTAL CALCIUM) 500 MG chewable tablet Chew 2 tablets by mouth 2 (two) times daily as needed for indigestion or heartburn.    . cholecalciferol (VITAMIN D) 1000 UNITS tablet Take 1,000  Units by mouth daily.    . CRESTOR 40 MG tablet TAKE 1/2 TABLET EVERY EVENING (Patient taking differently: TAKE 1/2 TABLET BY MOUTH EVERY EVENING) 45 tablet 2  . dextromethorphan (DELSYM) 30 MG/5ML liquid Take 90 mg by mouth 2 (two) times daily as needed for cough.    . docusate sodium (COLACE) 100 MG capsule Take 100 mg by mouth daily as needed for mild constipation.    . dorzolamide-timolol (COSOPT) 22.3-6.8 MG/ML ophthalmic solution Place 1 drop into both eyes 2 (two) times daily.     Marland Kitchen ELIQUIS 5 MG TABS tablet TAKE 1 TABLET TWICE DAILY 180 tablet 2  . fluticasone (FLONASE) 50 MCG/ACT nasal spray Place 2 sprays into both nostrils daily as needed for allergies or rhinitis.    . furosemide (LASIX) 40 MG tablet Take one tab (40mg ) in the AM, and 1/2 tab (20mg ) in the PM (Patient taking differently: Take one tab (40mg ) by mouth in the AM, and 1/2 tab (20mg ) by mouth in the PM) 135 tablet 3  . guaiFENesin (MUCINEX) 600 MG 12 hr tablet Take 600 mg by mouth 2 (two) times daily as needed for cough or to loosen phlegm.     . Lactobacillus (ACIDOPHILUS) CAPS capsule Take 1 capsule by mouth daily.    Marland Kitchen latanoprost (XALATAN) 0.005 % ophthalmic solution Place 1 drop into the left eye at bedtime.     Marland Kitchen levothyroxine (SYNTHROID, LEVOTHROID) 50 MCG tablet TAKE 1 TABLET EVERY DAY BEFORE BREAKFAST (Patient taking differently: TAKE 1 TABLET BY  MOUTH EVERY DAY BEFORE BREAKFAST) 90 tablet 3  . metoprolol tartrate (LOPRESSOR) 25 MG tablet Take 1 tablet (25 mg total) by mouth 3 (three) times daily. 270 tablet 3  . Multiple Vitamin (MULTIVITAMIN WITH MINERALS) TABS tablet Take 1 tablet by mouth daily.    Marland Kitchen nystatin cream (MYCOSTATIN) Apply 1 application topically 2 (two) times daily. 30 g 0  . Omega-3 Fatty Acids (FISH OIL) 1000 MG CAPS Take 1,000 mg by mouth every morning.     Marland Kitchen omeprazole (PRILOSEC) 40 MG capsule TAKE 1 CAPSULE EVERY DAY (Patient taking differently: TAKE 1 CAPSULE BY MOUTH EVERY DAY) 90 capsule 1  . oxyCODONE-acetaminophen (ROXICET) 5-325 MG per tablet Take 1-2 tablets by mouth every 4 (four) hours as needed for severe pain. 30 tablet 0  . senna-docusate (SENOKOT-S) 8.6-50 MG per tablet Take 1 tablet by mouth 2 (two) times daily. While taking pain meds to prevent constipation. 20 tablet 0  . traMADol (ULTRAM) 50 MG tablet Take 100 mg by mouth every 6 (six) hours as needed (back pain).     No current facility-administered medications on file prior to visit.    Allergies  Allergen Reactions  . Actos [Pioglitazone] Swelling    Family History  Problem Relation Age of Onset  . Liver cancer Mother     deceased age 17  . Cancer Mother     liver cancer  . Colon cancer Neg Hx   . Heart attack Father     BP 142/84 mmHg  Pulse 69  Temp(Src) 97.8 F (36.6 C) (Oral)  Wt 312 lb (141.522 kg)  SpO2 96%  Review of Systems Denies LOC.    Objective:   Physical Exam VITAL SIGNS:  See vs page GENERAL: no distress CV: dorsalis pedis pulses intact bilat.  EXTEMITIES: no deformity. no ulcer on the feet. feet are of normal color and temp. 1+ bilat leg edema. Several healed surgical scars at the right leg. There is bilateral  onychomycosis. There is very dry skin andhyperpigmentation of the legs.  Neuro: sensation is intact to touch on the feet, but decreased from normal.  Lab Results  Component Value Date   HGBA1C 8.7*  05/03/2015      Assessment & Plan:  DM: he needs increased rx   Patient is advised the following: Patient Instructions  For now, please take just 250 units 3 times a day (just before each meal), and 100 units with your bedtime snack.  Please take these numbers, no matter what your blood sugar is.  Here are several printed prescriptions.   blood tests are being requested for you today.  We'll let you know about the results. Please come back for a follow-up appointment in 3 months.

## 2015-05-17 NOTE — Telephone Encounter (Signed)
FYI  Please see below

## 2015-05-18 ENCOUNTER — Telehealth: Payer: Self-pay | Admitting: Endocrinology

## 2015-05-18 MED ORDER — INSULIN REGULAR HUMAN 100 UNIT/ML IJ SOLN: 250.0000 [IU] | Freq: Three times a day (TID) | INTRAMUSCULAR | Status: DC

## 2015-05-18 NOTE — Telephone Encounter (Signed)
Patient called stating he would like a refill on his medication   Rx: Humulin R 500 (ONLY 40 ML) Patient cannot afford the full 90 day   Pharmacy: [place]   Thank you

## 2015-05-18 NOTE — Telephone Encounter (Signed)
Spoke with patient and he only wanted 56ml sent to [facility], I advised pt that [facility] does 3 mth refills at time, I also got shannon to speak with patient and he stated he has other mediations for 30 days sent to [facility], pt demanded that we send to [facility] and he will call them to see what the problem is.

## 2015-05-21 ENCOUNTER — Encounter: Payer: Self-pay | Admitting: Vascular Surgery

## 2015-05-22 ENCOUNTER — Other Ambulatory Visit: Payer: Self-pay

## 2015-05-22 MED ORDER — INSULIN REGULAR HUMAN (CONC) 500 UNIT/ML ~~LOC~~ SOLN: SUBCUTANEOUS | Status: DC

## 2015-05-24 ENCOUNTER — Ambulatory Visit (INDEPENDENT_AMBULATORY_CARE_PROVIDER_SITE_OTHER): Payer: Self-pay | Admitting: Vascular Surgery

## 2015-05-24 ENCOUNTER — Encounter: Payer: Self-pay | Admitting: Vascular Surgery

## 2015-05-24 VITALS — BP 126/58 | HR 70 | Ht 70.0 in | Wt 310.0 lb

## 2015-05-24 DIAGNOSIS — H4011X3 Primary open-angle glaucoma, severe stage: Secondary | ICD-10-CM | POA: Diagnosis not present

## 2015-05-24 DIAGNOSIS — R1909 Other intra-abdominal and pelvic swelling, mass and lump: Secondary | ICD-10-CM

## 2015-05-24 NOTE — Progress Notes (Signed)
Patient name: JARAY BOLIVER MRN: [id number] DOB: Nov 14, 1941 Sex: male  REASON FOR VISIT: follow up after excision of right groin mass  HPI: Yazen SERGEI DELO is a 74 y.o. male who had been referred with a fungating mass just below the inguinal crease on the right. He had a varicose vein associated with this and for this reason vascular surgery was counseled for removal. He underwent removal of the mass on 05/08/2015.  Pathology returned: [place] CHRONIC INFLAMMATION. - UNDERLYING DILATED VESSELS WITH BLOOD CLOT (THROMBUS) FORMATION. - NO DYSPLASIA, ATYPIA OR MALIGNANCY IDENTIFIED  The patient has no specific complaints.  Current Outpatient Prescriptions  Medication Sig Dispense Refill  . acetaminophen (TYLENOL) 500 MG tablet Take 500 mg by mouth 2 (two) times daily as needed for moderate pain (pain).     Marland Kitchen albuterol (PROVENTIL HFA;VENTOLIN HFA) 108 (90 BASE) MCG/ACT inhaler Inhale 2 puffs into the lungs every 6 (six) hours as needed for wheezing or shortness of breath. 1 Inhaler 6  . ALPRAZolam (XANAX) 0.25 MG tablet Take 0.25 mg by mouth 3 (three) times daily as needed for anxiety.    . Ascorbic Acid (VITAMIN C) 500 MG tablet Take 500 mg by mouth daily.     . brimonidine (ALPHAGAN P) 0.1 % SOLN Place 1 drop into the left eye 3 (three) times daily.     . calcium carbonate (TUMS - DOSED IN MG ELEMENTAL CALCIUM) 500 MG chewable tablet Chew 2 tablets by mouth 2 (two) times daily as needed for indigestion or heartburn.    . cholecalciferol (VITAMIN D) 1000 UNITS tablet Take 1,000 Units by mouth daily.    . CRESTOR 40 MG tablet TAKE 1/2 TABLET EVERY EVENING (Patient taking differently: TAKE 1/2 TABLET BY MOUTH EVERY EVENING) 45 tablet 2  . dextromethorphan (DELSYM) 30 MG/5ML liquid Take 90 mg by mouth 2 (two) times daily as needed for cough.    . docusate sodium (COLACE) 100 MG capsule Take 100 mg by mouth daily as needed for  mild constipation.    . dorzolamide-timolol (COSOPT) 22.3-6.8 MG/ML ophthalmic solution Place 1 drop into both eyes 2 (two) times daily.     Marland Kitchen ELIQUIS 5 MG TABS tablet TAKE 1 TABLET TWICE DAILY 180 tablet 2  . fluticasone (FLONASE) 50 MCG/ACT nasal spray Place 2 sprays into both nostrils daily as needed for allergies or rhinitis.    . furosemide (LASIX) 40 MG tablet Take one tab (40mg ) in the AM, and 1/2 tab (20mg ) in the PM (Patient taking differently: Take one tab (40mg ) by mouth in the AM, and 1/2 tab (20mg ) by mouth in the PM) 135 tablet 3  . guaiFENesin (MUCINEX) 600 MG 12 hr tablet Take 600 mg by mouth 2 (two) times daily as needed for cough or to loosen phlegm.     . insulin regular human CONCENTRATED (HUMULIN R) 500 UNIT/ML SOLN injection Inject 250 3 times just before each meal. And, 100 units 1 time per with with bedtime snack. 40 mL 2  . Lactobacillus (ACIDOPHILUS) CAPS capsule Take 1 capsule by mouth daily.    Marland Kitchen latanoprost (XALATAN) 0.005 % ophthalmic solution Place 1 drop into the left eye at bedtime.     Marland Kitchen levothyroxine (SYNTHROID, LEVOTHROID) 50 MCG tablet TAKE 1 TABLET EVERY DAY BEFORE BREAKFAST (Patient taking differently: TAKE 1 TABLET BY MOUTH EVERY DAY BEFORE BREAKFAST) 90 tablet 3  . metoprolol tartrate (LOPRESSOR) 25 MG tablet Take 1 tablet (25  mg total) by mouth 3 (three) times daily. 270 tablet 3  . Multiple Vitamin (MULTIVITAMIN WITH MINERALS) TABS tablet Take 1 tablet by mouth daily.    Marland Kitchen nystatin cream (MYCOSTATIN) Apply 1 application topically 2 (two) times daily. 30 g 0  . Omega-3 Fatty Acids (FISH OIL) 1000 MG CAPS Take 1,000 mg by mouth every morning.     Marland Kitchen omeprazole (PRILOSEC) 40 MG capsule TAKE 1 CAPSULE EVERY DAY (Patient taking differently: TAKE 1 CAPSULE BY MOUTH EVERY DAY) 90 capsule 1  . oxyCODONE-acetaminophen (ROXICET) 5-325 MG per tablet Take 1-2 tablets by mouth every 4 (four) hours as needed for severe pain. 30 tablet 0  . senna-docusate (SENOKOT-S) 8.6-50  MG per tablet Take 1 tablet by mouth 2 (two) times daily. While taking pain meds to prevent constipation. 20 tablet 0  . traMADol (ULTRAM) 50 MG tablet Take 100 mg by mouth every 6 (six) hours as needed (back pain).     No current facility-administered medications for this visit.   REVIEW OF SYSTEMS: Valu.Nieves ] denotes positive finding; [  ] denotes negative finding  CARDIOVASCULAR:  [ ]  chest pain   [ ]  dyspnea on exertion    CONSTITUTIONAL:  [ ]  fever   [ ]  chills  PHYSICAL EXAM: Filed Vitals:   05/24/15 1143  BP: 126/58  Pulse: 70  Height: 5\' 10"  (1.778 m)  Weight: 310 lb (140.615 kg)  SpO2: 99%   GENERAL: The patient is a well-nourished male, in no acute distress. The vital signs are documented above. CARDIOVASCULAR: There is a regular rate and rhythm. PULMONARY: There is good air exchange bilaterally without wheezing or rales. His incision just below the inguinal crease on the right is healing adequately. He does have a significant fungal rash in Gwyndolyn Saxon encouraged him to place nystatin on this.  MEDICAL ISSUES: Status post excision of right groin mass which was benign as described above. I'll see him back as needed.  Deitra Mayo [facility]: [phone]

## 2015-05-28 ENCOUNTER — Ambulatory Visit (INDEPENDENT_AMBULATORY_CARE_PROVIDER_SITE_OTHER): Payer: Commercial Managed Care - HMO | Admitting: *Deleted

## 2015-05-28 DIAGNOSIS — I442 Atrioventricular block, complete: Secondary | ICD-10-CM

## 2015-05-28 NOTE — Progress Notes (Signed)
Remote pacemaker transmission.   

## 2015-05-29 ENCOUNTER — Encounter: Payer: Self-pay | Admitting: Nurse Practitioner

## 2015-05-29 ENCOUNTER — Ambulatory Visit (INDEPENDENT_AMBULATORY_CARE_PROVIDER_SITE_OTHER): Payer: Commercial Managed Care - HMO | Admitting: *Deleted

## 2015-05-29 ENCOUNTER — Ambulatory Visit (INDEPENDENT_AMBULATORY_CARE_PROVIDER_SITE_OTHER): Payer: Commercial Managed Care - HMO | Admitting: Nurse Practitioner

## 2015-05-29 VITALS — BP 120/74 | HR 69 | Ht 70.0 in | Wt 314.4 lb

## 2015-05-29 DIAGNOSIS — N183 Chronic kidney disease, stage 3 (moderate): Secondary | ICD-10-CM | POA: Diagnosis not present

## 2015-05-29 DIAGNOSIS — Z950 Presence of cardiac pacemaker: Secondary | ICD-10-CM

## 2015-05-29 DIAGNOSIS — I480 Paroxysmal atrial fibrillation: Secondary | ICD-10-CM | POA: Diagnosis not present

## 2015-05-29 DIAGNOSIS — I481 Persistent atrial fibrillation: Secondary | ICD-10-CM

## 2015-05-29 DIAGNOSIS — I5032 Chronic diastolic (congestive) heart failure: Secondary | ICD-10-CM | POA: Diagnosis not present

## 2015-05-29 DIAGNOSIS — I4819 Other persistent atrial fibrillation: Secondary | ICD-10-CM

## 2015-05-29 LAB — CUP PACEART REMOTE DEVICE CHECK
Battery Remaining Longevity: 86 mo
Battery Remaining Percentage: 81 %
Battery Voltage: 2.93 V
Brady Statistic AP VP Percent: 27 %
Brady Statistic AP VS Percent: 72 %
Brady Statistic AS VP Percent: 1 %
Brady Statistic AS VS Percent: 1 %
Brady Statistic RA Percent Paced: 79 %
Brady Statistic RV Percent Paced: 39 %
Date Time Interrogation Session: 20160620070753
Lead Channel Impedance Value: 340 Ohm
Lead Channel Impedance Value: 380 Ohm
Lead Channel Pacing Threshold Amplitude: 0.625 V
Lead Channel Pacing Threshold Amplitude: 0.875 V
Lead Channel Pacing Threshold Pulse Width: 0.5 ms
Lead Channel Pacing Threshold Pulse Width: 0.5 ms
Lead Channel Sensing Intrinsic Amplitude: 12 mV
Lead Channel Sensing Intrinsic Amplitude: 3.6 mV
Lead Channel Setting Pacing Amplitude: 1.125
Lead Channel Setting Pacing Amplitude: 1.625
Lead Channel Setting Pacing Pulse Width: 0.5 ms
Lead Channel Setting Sensing Sensitivity: 2 mV
Pulse Gen Model: 2210
Pulse Gen Serial Number: 7152830

## 2015-05-29 LAB — CBC
HCT: 42.3 % (ref 39.0–52.0)
Hemoglobin: 13.8 g/dL (ref 13.0–17.0)
MCHC: 32.7 g/dL (ref 30.0–36.0)
MCV: 93.4 fl (ref 78.0–100.0)
Platelets: 245 10*3/uL (ref 150.0–400.0)
RBC: 4.53 Mil/uL (ref 4.22–5.81)
RDW: 15.6 % — ABNORMAL HIGH (ref 11.5–15.5)
WBC: 11.4 10*3/uL — ABNORMAL HIGH (ref 4.0–10.5)

## 2015-05-29 LAB — BASIC METABOLIC PANEL
BUN: 28 mg/dL — ABNORMAL HIGH (ref 6–23)
CO2: 26 mEq/L (ref 19–32)
Calcium: 9.4 mg/dL (ref 8.4–10.5)
Chloride: 101 mEq/L (ref 96–112)
Creatinine, Ser: 1.36 mg/dL (ref 0.40–1.50)
GFR: 54.73 mL/min — ABNORMAL LOW (ref >60.00)
Glucose, Bld: 161 mg/dL — ABNORMAL HIGH (ref 70–99)
Potassium: 3.8 mEq/L (ref 3.5–5.1)
Sodium: 138 mEq/L (ref 135–145)

## 2015-05-29 LAB — CUP PACEART INCLINIC DEVICE CHECK
Date Time Interrogation Session: 20160621152014
Pulse Gen Model: 2210
Pulse Gen Serial Number: 7152830

## 2015-05-29 LAB — HEPATIC FUNCTION PANEL
ALT: 27 U/L (ref 0–53)
AST: 29 U/L (ref 0–37)
Albumin: 4 g/dL (ref 3.5–5.2)
Alkaline Phosphatase: 107 U/L (ref 39–117)
Bilirubin, Direct: 0.1 mg/dL (ref 0.0–0.3)
Total Bilirubin: 0.4 mg/dL (ref 0.2–1.2)
Total Protein: 7.9 g/dL (ref 6.0–8.3)

## 2015-05-29 LAB — TSH: TSH: 3.48 u[IU]/mL (ref 0.35–4.50)

## 2015-05-29 LAB — PROTIME-INR
INR: 1.2 ratio — ABNORMAL HIGH (ref 0.8–1.0)
Prothrombin Time: 13.4 s — ABNORMAL HIGH (ref 9.6–13.1)

## 2015-05-29 LAB — APTT: aPTT: 31.7 s (ref 23.4–32.7)

## 2015-05-29 MED ORDER — FUROSEMIDE 40 MG PO TABS: ORAL_TABLET | ORAL | Status: DC

## 2015-05-29 NOTE — Progress Notes (Signed)
Pacemaker check in clinic- add on for AF. Pt seeing Truitt Merle, NP- checking to see if patient still in AF- unclear on EKG. AF ongoing for the last 18 days, 4 hours. LG to discuss medication adjustment with JA 05/30/15. Follow up as scheduled.

## 2015-05-29 NOTE — Progress Notes (Addendum)
CARDIOLOGY OFFICE NOTE  Date:  05/29/2015    [name] Date of Birth: [birth year] Medical Record #[id number]  PCP:  Renato Shin, MD  Cardiologist:  Allred    Chief Complaint  Patient presents with  . Congestive Heart Failure    3 month check - seen for Dr. Rayann Heman    History of Present Illness: [name] is a 74 y.o. male who presents today for a 3 month check. Seen for Dr. Rayann Heman. He has a history of diastolic HF, morbid obesity, PAF with past cardioversion, venous insufficiency, CKD, anemia, hypothyroidism, DM, COPD, OSA on CPAP, HLD, SSS with PPM in place, CAD (no specifics noted), HTN and depression. On chronic anticoagulation with Eliquis.   He was last cardioverted from afib 11/08/14.  Afib burden less than 1% by interrogation at visit from December.   Comes in today. Here alone. He continues to struggle with sequelae from his morbid obesity. He has chronic venous insufficiency and unsteadiness chronically and walks with a walker. He has become progressively debilitated over the past few years. Notes his heart is skipping more. He has had a right groin mass excised by [facility] - this was benign. Needs Lasix refilled. He tells me that he remains weak. Tires easily. No energy. Some shortness of breath - especially if he does much. Notes sometimes his heart is skipping and sometimes it is not.   Past Medical History  Diagnosis Date  . COPD (chronic obstructive pulmonary disease)     CPAP  . Renal insufficiency   . Hyperlipemia   . Hypothyroidism   . Hyperkalemia   . 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 Sick sinus syndrome     s/p PPM by JA  . Pancreatitis   . Morbid obesity   . OSA on CPAP     using CPAP although it is hard for him to tolerate  . CAD (coronary artery disease)   . Hypertensive cardiovascular-renal disease   . Depression   . Anxiety   . H/O Legionnaire's disease 2003  . History of blood clots     R groin  . Peripheral neuropathy     . Osteoarthritis     fingers  . GERD (gastroesophageal reflux disease)   . Anemia     hx of  . Pneumonia 2003  . Cancer 2014    bladder cancer AND RIGHT URETERAL CANCER  . Diastolic dysfunction with chronic heart failure   . Persistent atrial fibrillation   . Atypical atrial flutter   . Venous insufficiency   . Complete heart block     cardioverted from A.Fib,hx. Sick sinus syndrome -Pacemaker implanted  . Pacemaker     implanted [employer] 7'11  . CHF (congestive heart failure)   . Gait difficulty     slow gait"ambulates with walker"  . Shortness of breath dyspnea     with exertion  . Diabetes mellitus     TypeII  . History of kidney stones     Past Surgical History  Procedure Laterality Date  . Nasal septum surgery  1967  . Pacemaker placement  06/2010    [facility] Accent RF DR, Model [id number] ( Serial number [id number])  . Thromboembolectomy and four compartment fasciotomy Right 2009    leg  . Cholecystectomy  2012  . Insert / replace / remove pacemaker  2011  . Transurethral resection of bladder tumor with gyrus (turbt-gyrus) N/A  10/26/2013    Procedure: TRANSURETHRAL RESECTION OF BLADDER TUMOR WITH GYRUS (TURBT-GYRUS);  Surgeon: Alexis Frock, MD;  Location: [facility];  Service: Urology;  Laterality: N/A;  . Cystoscopy w/ ureteral stent placement Right 10/26/2013    Procedure: CYSTOSCOPY WITH RETROGRADE PYELOGRAM/URETERAL STENT PLACEMENT;  Surgeon: Alexis Frock, MD;  Location: [facility];  Service: Urology;  Laterality: Right;  . Embolectomy Left 11/02/2013    Procedure: LEFT FEMORAL EMBOLECTOMY, LEFT FEMORAL ARTERY ENDARTERECTOMY WITH DACRON PATCH ANGIOPLASTY.;  Surgeon: Mal Misty, MD;  Location: [place];  Service: Vascular;  Laterality: Left;  . Transurethral resection of bladder tumor with gyrus (turbt-gyrus) N/A 01/11/2014    Procedure: TRANSURETHRAL RESECTION OF BLADDER TUMOR WITH GYRUS (TURBT-GYRUS);  Surgeon: Alexis Frock, MD;  Location: [facility];  Service:  Urology;  Laterality: N/A;  . Cystoscopy with ureteroscopy and stent placement Right 01/11/2014    Procedure: CYSTOSCOPY WITH URETEROSCOPY ,RIGHT RETROGRADE AND STENT CHANGE AND LASER OF URETERAL TUMOR;  Surgeon: Alexis Frock, MD;  Location: [facility];  Service: Urology;  Laterality: Right;  . Holmium laser application Right 01/15/3661    Procedure: HOLMIUM LASER APPLICATION;  Surgeon: Alexis Frock, MD;  Location: [facility];  Service: Urology;  Laterality: Right;  . Tee without cardioversion N/A 05/16/2014    Procedure: TRANSESOPHAGEAL ECHOCARDIOGRAM (TEE);  Surgeon: Josue Hector, MD;  Location: [facility];  Service: Cardiovascular;  Laterality: N/A;  . Cardioversion N/A 05/16/2014    Procedure: CARDIOVERSION;  Surgeon: Josue Hector, MD;  Location: [facility];  Service: Cardiovascular;  Laterality: N/A;  . Cardiac catheterization      11-02-13  . Cystoscopy/retrograde/ureteroscopy Right 08/23/2014    Procedure: CYSTOSCOPY/RETROGRADE/ DIAGNOSTIC URETEROSCOPY/RIGHT RENAL STONE EXTRACTION;  Surgeon: Alexis Frock, MD;  Location: [facility];  Service: Urology;  Laterality: Right;  . Cardioversion N/A 11/08/2014    Procedure: CARDIOVERSION;  Surgeon: Fay Records, MD;  Location: [place];  Service: Cardiovascular;  Laterality: N/A;  . Eye surgery Left     cataract  . Belpharoptosis repair Right     Glaucoma  . Wisdom tooth extraction    . Groin debridement Right 05/08/2015    Procedure: REMOVAL OF RIGHT GROIN MASS;  Surgeon: Angelia Mould, MD;  Location: [facility] OR;  Service: Vascular;  Laterality: Right;     Medications: Current Outpatient Prescriptions  Medication Sig Dispense Refill  . acetaminophen (TYLENOL) 500 MG tablet Take 500 mg by mouth 2 (two) times daily as needed for moderate pain (pain).     Marland Kitchen albuterol (PROVENTIL HFA;VENTOLIN HFA) 108 (90 BASE) MCG/ACT inhaler Inhale 2 puffs into the lungs every 6 (six) hours as needed for wheezing or shortness of breath. 1 Inhaler 6  .  ALPRAZolam (XANAX) 0.25 MG tablet Take 0.25 mg by mouth 3 (three) times daily as needed for anxiety.    . Ascorbic Acid (VITAMIN C) 500 MG tablet Take 500 mg by mouth daily.     . brimonidine (ALPHAGAN P) 0.1 % SOLN Place 1 drop into the left eye 3 (three) times daily.     . calcium carbonate (TUMS - DOSED IN MG ELEMENTAL CALCIUM) 500 MG chewable tablet Chew 2 tablets by mouth 2 (two) times daily as needed for indigestion or heartburn.    . cholecalciferol (VITAMIN D) 1000 UNITS tablet Take 1,000 Units by mouth daily.    . CRESTOR 40 MG tablet TAKE 1/2 TABLET EVERY EVENING (Patient taking differently: TAKE 1/2 TABLET BY MOUTH EVERY EVENING) 45 tablet 2  . dextromethorphan (DELSYM) 30 MG/5ML  liquid Take 90 mg by mouth 2 (two) times daily as needed for cough.    . docusate sodium (COLACE) 100 MG capsule Take 100 mg by mouth daily as needed for mild constipation.    . dorzolamide-timolol (COSOPT) 22.3-6.8 MG/ML ophthalmic solution Place 1 drop into both eyes 2 (two) times daily.     Marland Kitchen ELIQUIS 5 MG TABS tablet TAKE 1 TABLET TWICE DAILY 180 tablet 2  . fluticasone (FLONASE) 50 MCG/ACT nasal spray Place 2 sprays into both nostrils daily as needed for allergies or rhinitis.    . furosemide (LASIX) 40 MG tablet Take one tab (40mg ) in the AM, and 1/2 tab (20mg ) in the PM (Patient taking differently: Take one tab (40mg ) by mouth in the AM, and 1/2 tab (20mg ) by mouth in the PM) 135 tablet 3  . guaiFENesin (MUCINEX) 600 MG 12 hr tablet Take 600 mg by mouth 2 (two) times daily as needed for cough or to loosen phlegm.     . insulin regular human CONCENTRATED (HUMULIN R) 500 UNIT/ML SOLN injection Inject 250 3 times just before each meal. And, 100 units 1 time per with with bedtime snack. 40 mL 2  . Lactobacillus (ACIDOPHILUS) CAPS capsule Take 1 capsule by mouth daily.    Marland Kitchen latanoprost (XALATAN) 0.005 % ophthalmic solution Place 1 drop into the left eye at bedtime.     Marland Kitchen levothyroxine (SYNTHROID, LEVOTHROID) 50  MCG tablet TAKE 1 TABLET EVERY DAY BEFORE BREAKFAST (Patient taking differently: TAKE 1 TABLET BY MOUTH EVERY DAY BEFORE BREAKFAST) 90 tablet 3  . metoprolol tartrate (LOPRESSOR) 25 MG tablet Take 1 tablet (25 mg total) by mouth 3 (three) times daily. 270 tablet 3  . Multiple Vitamin (MULTIVITAMIN WITH MINERALS) TABS tablet Take 1 tablet by mouth daily.    . Omega-3 Fatty Acids (FISH OIL) 1000 MG CAPS Take 1,000 mg by mouth every morning.     Marland Kitchen omeprazole (PRILOSEC) 40 MG capsule TAKE 1 CAPSULE EVERY DAY (Patient taking differently: TAKE 1 CAPSULE BY MOUTH EVERY DAY) 90 capsule 1  . senna-docusate (SENOKOT-S) 8.6-50 MG per tablet Take 1 tablet by mouth 2 (two) times daily. While taking pain meds to prevent constipation. 20 tablet 0  . traMADol (ULTRAM) 50 MG tablet Take 100 mg by mouth every 6 (six) hours as needed (back pain).     No current facility-administered medications for this visit.    Allergies: Allergies  Allergen Reactions  . Actos [Pioglitazone] Swelling    Social History: The patient  reports that he quit smoking about 8 years ago. His smoking use included Cigarettes. He has a 100 pack-year smoking history. He has never used smokeless tobacco. He reports that he does not drink alcohol or use illicit drugs.   Family History: The patient's family history includes Cancer in his mother; Heart attack in his father; Liver cancer in his mother. There is no history of Colon cancer.   Review of Systems: Please see the history of present illness.   Otherwise, the review of systems is positive for none.   All other systems are reviewed and negative.   Physical Exam: VS:  BP 120/74 mmHg  Pulse 69  Ht 5\' 10"  (1.778 m)  Wt 314 lb 6.4 oz (142.611 kg)  BMI 45.11 kg/m2 .  BMI Body mass index is 45.11 kg/(m^2).  Wt Readings from Last 3 Encounters:  05/29/15 314 lb 6.4 oz (142.611 kg)  05/24/15 310 lb (140.615 kg)  05/17/15 312 lb (141.522 kg)  General: Pleasant. He is morbidly  obese but in no acute distress. Using a walker.  HEENT: Normal. Neck: Thick.  Cardiac: Fairly regular rate and rhythm. Soft outflow murmur.  Respiratory:  Lungs are clear to auscultation bilaterally with normal work of breathing.  GI: Obese. Large pannus. MS: No deformity or atrophy. Gait and ROM intact. Skin: Warm and dry. Color is ruddy Neuro:  Strength and sensation are intact and no gross focal deficits noted.  Psych: Alert, appropriate and with normal affect.   LABORATORY DATA:  EKG:  EKG is ordered today. This demonstrates paced rhythm - lots of artifact.   Lab Results  Component Value Date   WBC 9.3 05/03/2015   HGB 14.6 05/08/2015   HCT 43.0 05/08/2015   PLT 250 05/03/2015   GLUCOSE 158* 05/08/2015   CHOL 123 08/09/2014   TRIG * 08/09/2014    470.0 Triglyceride is over 400; calculations on Lipids are invalid.   HDL 25.80* 08/09/2014   LDLDIRECT 58.9 08/09/2014   LDLCALC  05/07/2008    UNABLE TO CALCULATE IF TRIGLYCERIDE OVER 400 mg/dL        Total Cholesterol/HDL:CHD Risk Coronary Heart Disease Risk Table                     Men   Women  1/2 Average Risk   3.4   3.3   ALT 51 08/09/2014   AST 58* 08/09/2014   NA 141 05/08/2015   K 4.5 05/08/2015   CL 106 05/03/2015   CREATININE 1.37* 05/03/2015   BUN 20 05/03/2015   CO2 23 05/03/2015   TSH 1.20 08/09/2014   PSA 0.61 08/09/2014   INR 1.07 05/08/2015   HGBA1C 8.2* 05/17/2015   MICROALBUR 170.0 Repeated and verified X2.* 06/27/2013    BNP (last 3 results) No results for input(s): BNP in the last 8760 hours.  ProBNP (last 3 results)  Recent Labs  06/07/14 1402  PROBNP 77.0     Other Studies Reviewed Today:   Echo Study Conclusions from June 2015  - Left ventricle: Wall thickness was increased in a pattern of severe LVH. The estimated ejection fraction was 55%. - Aortic valve: There was very mild stenosis. - Mitral valve: Mildly calcified annulus. There was mild regurgitation. - Left atrium:  The atrium was dilated. No evidence of thrombus in the atrial cavity or appendage. - Right atrium: No evidence of thrombus in the atrial cavity or appendage. - Atrial septum: No defect or patent foramen ovale was identified. Echo contrast study showed no right-to-left atrial level shunt, following an increase in RA pressure induced by provocative maneuvers. - Tricuspid valve: No evidence of vegetation. - Pulmonic valve: No evidence of vegetation. - Impressions: No LAA thrombus Proceeded with succesful [place] under propofol anesthesia.  Impressions:  - No LAA thrombus Proceeded with succesful [facility] under propofol anesthesia.  Assessment/Plan: 1. Paroxysmal afib Continue on anticoagulation with Eliquis - his device is checked today - looks like he has been in AF over the past 2 weeks - he remains in AF today. He wishes to try and be in NSR. Will talk with Dr. Rayann Heman - may need to consider AAD therapy. May proceed on with cardioversion. I will check labs today. Speak with Dr. Rayann Heman tomorrow when he is here in the office. Its hard to say just how symptomatic he has been - seems surprised to know he has been out of rhythm for the last 17 days and is in AF today.  2. Chronic diastolic dysfunction The importance of sodium restriction and daily weight is advised  4. Sick sinus syndrome/ complete heart block Followed by Dr. Rayann Heman  5. Chronic venous stasis changes His BLE edema is primarily due to obesity. Weight loss is encouraged. Support hose are also recommended.  6. Morbid obesity Body mass index is 45.08 kg/(m^2). He has been referred to nutrition and rehab. Unfortunately, I do not see where he will be able to make real substantial changes. His overall prognosis is quite poor.  7. OSA Compliance with CPAP is encouraged  8. Hypertensive cardio-renal disease  Current medicines are reviewed with the patient today.  The patient does not have concerns regarding medicines other than  what has been noted above.  The following changes have been made:  See above.  Labs/ tests ordered today include:    Orders Placed This Encounter  Procedures  . EKG 12-Lead     Disposition:   Further disposition to follow.   Patient is agreeable to this plan and will call if any problems develop in the interim.   Signed: Burtis Junes, RN, ANP-C 05/29/2015 3:00 PM  [facility] [address] [place], [place]  [address] Phone: [phone] Fax: [phone]        Addendum:  Discussed with Dr. Rayann Heman 05/30/2015  Will start Amiodarone 200 mg BID See back in 4 weeks on a day with Dr. Rayann Heman - if remains in AF - will plan to cardiovert.   Burtis Junes, RN, [place] [address] [place],   [address] [phone]

## 2015-05-29 NOTE — Patient Instructions (Addendum)
We will be checking the following labs today - BMET, CBC, HPF, INR, PTT & TSH   Medication Instructions:    Continue with your current medicines.     Testing/Procedures To Be Arranged:  N/A  Follow-Up:   I will talk with Dr. Rayann Heman tomorrow and we will decide about adding medicines or proceeding with repeat cardioversion    Other Special Instructions:   N/A  Call the [place] office at [phone] if you have any questions, problems or concerns.

## 2015-05-30 ENCOUNTER — Telehealth: Payer: Self-pay | Admitting: *Deleted

## 2015-05-30 ENCOUNTER — Other Ambulatory Visit: Payer: Self-pay | Admitting: *Deleted

## 2015-05-30 MED ORDER — AMIODARONE HCL 200 MG PO TABS: 200.0000 mg | ORAL_TABLET | Freq: Two times a day (BID) | ORAL | Status: DC

## 2015-05-30 NOTE — Telephone Encounter (Signed)
S/w pt is agreeable to plan will start amiodarone ( 200 mg ) bid, sent into [facility] per pt's request.  Will come and see Dr. Rayann Heman on July 11 with EKG and pacer check.

## 2015-05-31 ENCOUNTER — Encounter: Payer: Self-pay | Admitting: Cardiology

## 2015-06-04 ENCOUNTER — Ambulatory Visit: Payer: Commercial Managed Care - HMO | Admitting: Endocrinology

## 2015-06-06 ENCOUNTER — Encounter: Payer: Self-pay | Admitting: Internal Medicine

## 2015-06-12 ENCOUNTER — Encounter: Payer: Self-pay | Admitting: Internal Medicine

## 2015-06-18 ENCOUNTER — Encounter: Payer: Self-pay | Admitting: *Deleted

## 2015-06-18 ENCOUNTER — Ambulatory Visit: Payer: Commercial Managed Care - HMO | Admitting: Internal Medicine

## 2015-06-18 ENCOUNTER — Encounter: Payer: Self-pay | Admitting: Internal Medicine

## 2015-06-18 ENCOUNTER — Ambulatory Visit (INDEPENDENT_AMBULATORY_CARE_PROVIDER_SITE_OTHER): Payer: Commercial Managed Care - HMO | Admitting: Internal Medicine

## 2015-06-18 VITALS — BP 114/62 | HR 70 | Ht 70.0 in | Wt 313.0 lb

## 2015-06-18 DIAGNOSIS — I442 Atrioventricular block, complete: Secondary | ICD-10-CM | POA: Diagnosis not present

## 2015-06-18 DIAGNOSIS — I4891 Unspecified atrial fibrillation: Secondary | ICD-10-CM

## 2015-06-18 DIAGNOSIS — I5032 Chronic diastolic (congestive) heart failure: Secondary | ICD-10-CM

## 2015-06-18 DIAGNOSIS — I130 Hypertensive heart and chronic kidney disease with heart failure and stage 1 through stage 4 chronic kidney disease, or unspecified chronic kidney disease: Secondary | ICD-10-CM

## 2015-06-18 DIAGNOSIS — G4733 Obstructive sleep apnea (adult) (pediatric): Secondary | ICD-10-CM

## 2015-06-18 DIAGNOSIS — I495 Sick sinus syndrome: Secondary | ICD-10-CM | POA: Diagnosis not present

## 2015-06-18 LAB — CBC WITH DIFFERENTIAL/PLATELET
Basophils Absolute: 0 10*3/uL (ref 0.0–0.1)
Basophils Relative: 0.3 % (ref 0.0–3.0)
Eosinophils Absolute: 0.1 10*3/uL (ref 0.0–0.7)
Eosinophils Relative: 1.2 % (ref 0.0–5.0)
HCT: 41.4 % (ref 39.0–52.0)
HEMOGLOBIN: 13.6 g/dL (ref 13.0–17.0)
Lymphocytes Relative: 28 % (ref 12.0–46.0)
Lymphs Abs: 3 10*3/uL (ref 0.7–4.0)
MCHC: 32.9 g/dL (ref 30.0–36.0)
MCV: 93.5 fl (ref 78.0–100.0)
MONO ABS: 0.6 10*3/uL (ref 0.1–1.0)
MONOS PCT: 5.6 % (ref 3.0–12.0)
NEUTROS ABS: 7.1 10*3/uL (ref 1.4–7.7)
Neutrophils Relative %: 64.9 % (ref 43.0–77.0)
PLATELETS: 247 10*3/uL (ref 150.0–400.0)
RBC: 4.43 Mil/uL (ref 4.22–5.81)
RDW: 15.5 % (ref 11.5–15.5)
WBC: 10.9 10*3/uL — AB (ref 4.0–10.5)

## 2015-06-18 LAB — CUP PACEART INCLINIC DEVICE CHECK
Battery Voltage: 2.92 V
Lead Channel Impedance Value: 375 Ohm
Lead Channel Impedance Value: 375 Ohm
Lead Channel Sensing Intrinsic Amplitude: 5.8 mV
MDC IDC MSMT BATTERY REMAINING LONGEVITY: 78 mo
MDC IDC MSMT LEADCHNL RA SENSING INTR AMPL: 5 mV
MDC IDC MSMT LEADCHNL RV PACING THRESHOLD AMPLITUDE: 0.75 V
MDC IDC MSMT LEADCHNL RV PACING THRESHOLD PULSEWIDTH: 0.5 ms
MDC IDC PG SERIAL: 7152830
MDC IDC SESS DTM: 20160711121842
MDC IDC SET LEADCHNL RA PACING AMPLITUDE: 1.625
MDC IDC SET LEADCHNL RV PACING AMPLITUDE: 1 V
MDC IDC SET LEADCHNL RV PACING PULSEWIDTH: 0.5 ms
MDC IDC SET LEADCHNL RV SENSING SENSITIVITY: 2 mV
MDC IDC STAT BRADY RA PERCENT PACED: 64 %
MDC IDC STAT BRADY RV PERCENT PACED: 51 %
Pulse Gen Model: 2210

## 2015-06-18 LAB — BASIC METABOLIC PANEL
BUN: 33 mg/dL — AB (ref 6–23)
CALCIUM: 9.3 mg/dL (ref 8.4–10.5)
CHLORIDE: 97 meq/L (ref 96–112)
CO2: 26 meq/L (ref 19–32)
Creatinine, Ser: 1.54 mg/dL — ABNORMAL HIGH (ref 0.40–1.50)
GFR: 47.41 mL/min — ABNORMAL LOW (ref >60.00)
Glucose, Bld: 372 mg/dL — ABNORMAL HIGH (ref 70–99)
Potassium: 4.4 mEq/L (ref 3.5–5.1)
Sodium: 135 mEq/L (ref 135–145)

## 2015-06-18 NOTE — Patient Instructions (Signed)
Medication Instructions: - no changes  Labwork: - Your physician recommends that you have lab work today: BMP/ CBC  Procedures/Testing: - Your physician has recommended that you have a Cardioversion (DCCV). Electrical Cardioversion uses a jolt of electricity to your heart either through paddles or wired patches attached to your chest. This is a controlled, usually prescheduled, procedure. Defibrillation is done under light anesthesia in the hospital, and you usually go home the day of the procedure. This is done to get your heart back into a normal rhythm. You are not awake for the procedure. Please see the instruction sheet given to you today.  Follow-Up: - Your physician recommends that you schedule a follow-up appointment in: 6 weeks with [name] Merle, NP  Any Additional Special Instructions Will Be Listed Below (If Applicable). - none

## 2015-06-18 NOTE — Progress Notes (Signed)
PCP:  Renato Shin, MD  The patient presents today for electrophysiology followup.  H/o  diastolic dysfunction. He struggles with sequelae from his morbid obesity.  He has chronic venous insufficiency and unsteadiness chronically and walks with a walker.  He has become progressively debilitated over the past few years.  He has fatigue chronically.  He has recently returned to afib though I am not convinced that he has had any clinical deterioration specifically related to this.  He has been placed on amiodarone but remains in atrial fibrillation/ atypical flutter. Today, he denies symptoms of chest pain, orthopnea, PND,  presyncope, syncope, or neurologic sequela.  The patient feels that he is tolerating medications without difficulties and is otherwise without complaint today.   Past Medical History  Diagnosis Date  . Diabetes mellitus   . COPD (chronic obstructive pulmonary disease)     CPAP  . Renal insufficiency   . Hyperlipemia   . Hypothyroidism   . Hyperkalemia   . 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 Sick sinus syndrome     s/p PPM by JA  . Pancreatitis   . Morbid obesity   . OSA on CPAP     using CPAP although it is hard for him to tolerate  . CAD (coronary artery disease)   . Hypertensive cardiovascular-renal disease   . Depression   . Anxiety   . H/O Legionnaire's disease 2003  . History of blood clots     R groin  . Peripheral neuropathy   . Osteoarthritis     fingers  . GERD (gastroesophageal reflux disease)   . Anemia     hx of  . Pneumonia 2003  . Cancer 2014    bladder cancer AND RIGHT URETERAL CANCER  . Diastolic dysfunction with chronic heart failure   . Persistent atrial fibrillation   . Atypical atrial flutter   . Venous insufficiency   . Complete heart block     cardioverted from A.Fib,hx. Sick sinus syndrome -Pacemaker implanted  . Pacemaker     implanted [employer] 7'11  . CHF (congestive heart failure)   . Gait difficulty     slow  gait"ambulates with walker"   Past Surgical History  Procedure Laterality Date  . Nasal septum surgery  1967  . Pacemaker placement  06/2010    [facility] RF DR, Model [id number] ( Serial number [id number])  . Thromboembolectomy and four compartment fasciotomy  2009    GROIN AREA  . Cataract extraction Right   . Cholecystectomy  2012  . Insert / replace / remove pacemaker  2011  . Transurethral resection of bladder tumor with gyrus (turbt-gyrus) N/A 10/26/2013    Procedure: TRANSURETHRAL RESECTION OF BLADDER TUMOR WITH GYRUS (TURBT-GYRUS);  Surgeon: Alexis Frock, MD;  Location: [facility];  Service: Urology;  Laterality: N/A;  . Cystoscopy w/ ureteral stent placement Right 10/26/2013    Procedure: CYSTOSCOPY WITH RETROGRADE PYELOGRAM/URETERAL STENT PLACEMENT;  Surgeon: Alexis Frock, MD;  Location: [facility];  Service: Urology;  Laterality: Right;  . Embolectomy Left 11/02/2013    Procedure: LEFT FEMORAL EMBOLECTOMY, LEFT FEMORAL ARTERY ENDARTERECTOMY WITH DACRON PATCH ANGIOPLASTY.;  Surgeon: Mal Misty, MD;  Location: [place];  Service: Vascular;  Laterality: Left;  . Transurethral resection of bladder tumor with gyrus (turbt-gyrus) N/A 01/11/2014    Procedure: TRANSURETHRAL RESECTION OF BLADDER TUMOR WITH GYRUS (TURBT-GYRUS);  Surgeon: Alexis Frock, MD;  Location: [facility];  Service: Urology;  Laterality: N/A;  . Cystoscopy with ureteroscopy and stent placement Right 01/11/2014    Procedure: CYSTOSCOPY WITH URETEROSCOPY ,RIGHT RETROGRADE AND STENT CHANGE AND LASER OF URETERAL TUMOR;  Surgeon: Alexis Frock, MD;  Location: [facility];  Service: Urology;  Laterality: Right;  . Holmium laser application Right 06/09/4192    Procedure: HOLMIUM LASER APPLICATION;  Surgeon: Alexis Frock, MD;  Location: [facility];  Service: Urology;  Laterality: Right;  . Tee without cardioversion N/A 05/16/2014    Procedure: TRANSESOPHAGEAL ECHOCARDIOGRAM (TEE);  Surgeon: Josue Hector, MD;  Location: [facility];   Service: Cardiovascular;  Laterality: N/A;  . Cardioversion N/A 05/16/2014    Procedure: CARDIOVERSION;  Surgeon: Josue Hector, MD;  Location: [facility]COPY;  Service: Cardiovascular;  Laterality: N/A;  . Cardiac catheterization      11-02-13  . Cystoscopy/retrograde/ureteroscopy Right 08/23/2014    Procedure: CYSTOSCOPY/RETROGRADE/ DIAGNOSTIC URETEROSCOPY/RIGHT RENAL STONE EXTRACTION;  Surgeon: Alexis Frock, MD;  Location: [facility];  Service: Urology;  Laterality: Right;  . Cardioversion N/A 11/08/2014    Procedure: CARDIOVERSION;  Surgeon: Fay Records, MD;  Location: [facility] ENDOSCOPY;  Service: Cardiovascular;  Laterality: N/A;    Current Outpatient Prescriptions  Medication Sig Dispense Refill  . acetaminophen (TYLENOL) 500 MG tablet Take 500 mg by mouth 2 (two) times daily as needed for moderate pain (pain).     Marland Kitchen albuterol (PROVENTIL HFA;VENTOLIN HFA) 108 (90 BASE) MCG/ACT inhaler Inhale 2 puffs into the lungs every 6 (six) hours as needed for wheezing or shortness of breath. 1 Inhaler 6  . ALPRAZolam (XANAX) 0.25 MG tablet Take 0.25 mg by mouth 3 (three) times daily as needed for anxiety.    Marland Kitchen apixaban (ELIQUIS) 5 MG TABS tablet Take 1 tablet (5 mg total) by mouth 2 (two) times daily. 180 tablet 3  . Ascorbic Acid (VITAMIN C) 500 MG tablet Take 500 mg by mouth daily.     . brimonidine (ALPHAGAN P) 0.1 % SOLN Place 1 drop into the left eye 3 (three) times daily.     . calcium carbonate (TUMS - DOSED IN MG ELEMENTAL CALCIUM) 500 MG chewable tablet Chew 2 tablets by mouth 2 (two) times daily as needed for indigestion or heartburn.    . cholecalciferol (VITAMIN D) 1000 UNITS tablet Take 1,000 Units by mouth daily.    . CRESTOR 40 MG tablet TAKE 1/2 TABLET EVERY EVENING (Patient taking differently: TAKE 1/2 TABLET BY MOUTH EVERY EVENING) 45 tablet 2  . dextromethorphan (DELSYM) 30 MG/5ML liquid Take 90 mg by mouth 2 (two) times daily as needed for cough.    . dorzolamide-timolol (COSOPT) 22.3-6.8  MG/ML ophthalmic solution Place 1 drop into both eyes 2 (two) times daily.     . fluticasone (FLONASE) 50 MCG/ACT nasal spray Place 2 sprays into both nostrils daily as needed for allergies or rhinitis.    . furosemide (LASIX) 40 MG tablet Take one tab (40mg ) in the AM, and 1/2 tab (20mg ) in the PM (Patient taking differently: Take one tab (40mg ) by mouth in the AM, and 1/2 tab (20mg ) by mouth in the PM) 135 tablet 3  . guaiFENesin (MUCINEX) 600 MG 12 hr tablet Take 600 mg by mouth 2 (two) times daily as needed for cough or to loosen phlegm.     Marland Kitchen HUMULIN R 500 UNIT/ML SOLN injection DRAW UP 50 UNITS ON U100 SYRINGE (250 ACTUAL INSULIN UNITS) AND INJECT SUBCUTANEOUSLY 4 TIMES DAILY. 80 mL 2  . Lactobacillus (ACIDOPHILUS) CAPS capsule Take 1 capsule by  mouth daily.    Marland Kitchen latanoprost (XALATAN) 0.005 % ophthalmic solution Place 1 drop into both eyes at bedtime.    Marland Kitchen levothyroxine (SYNTHROID, LEVOTHROID) 50 MCG tablet TAKE 1 TABLET EVERY DAY BEFORE BREAKFAST (Patient taking differently: TAKE 1 TABLET BY MOUTH EVERY DAY BEFORE BREAKFAST) 90 tablet 3  . metoprolol tartrate (LOPRESSOR) 25 MG tablet Take 1 tablet (25 mg total) by mouth 3 (three) times daily. 270 tablet 3  . Multiple Vitamin (MULTIVITAMIN WITH MINERALS) TABS tablet Take 1 tablet by mouth daily.    . Omega-3 Fatty Acids (FISH OIL) 1000 MG CAPS Take 1,000 mg by mouth every morning.     Marland Kitchen omeprazole (PRILOSEC) 40 MG capsule TAKE 1 CAPSULE EVERY DAY (Patient taking differently: TAKE 1 CAPSULE BY MOUTH EVERY DAY) 90 capsule 1  . senna-docusate (SENOKOT-S) 8.6-50 MG per tablet Take 1 tablet by mouth 2 (two) times daily. While taking pain meds to prevent constipation. 20 tablet 0  . traMADol (ULTRAM) 50 MG tablet Take 100 mg by mouth every 6 (six) hours as needed (back pain).    Marland Kitchen venlafaxine XR (EFFEXOR-XR) 150 MG 24 hr capsule Take 150 mg by mouth daily as needed (anxiety.).     No current facility-administered medications for this visit.     Allergies  Allergen Reactions  . Actos [Pioglitazone] Swelling    History   Social History  . Marital Status: Married    Spouse Name: N/A  . Number of Children: N/A  . 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 Smoking status: Former Smoker -- 2.00 packs/day for 50 years    Types: Cigarettes    Quit date: 12/08/2006  . Smokeless tobacco: Never Used  . Alcohol Use: No     Comment: quit 3 years ago  . Drug Use: No  . Sexual Activity: Not Currently   Other Topics Concern  . Not on file   Social History Narrative   Family History  Problem Relation Age of Onset  . Liver cancer Mother     deceased age 63  . Cancer Mother     liver cancer  . Colon cancer Neg Hx   . Heart attack Father     ROS-  All systems are reviewed and are negative except as outlined in the HPI above  Physical Exam:                   Filed Vitals:   06/18/15 1109  BP: 114/62  Pulse: 70   GEN- The patient is morbidly obese and chronically ill appearing, alert and oriented x 3 today.   Head- normocephalic, atraumatic Eyes-  Sclera clear, conjunctiva pink Ears- poor hearing (wears hearing aids) Oropharynx- clear with very dry MM Neck- supple,  Flat JVP Lungs- Clear to ausculation bilaterally, normal work of breathing Heart- irregular rate and rhythm  GI- soft, NT, ND, + BS Extremities- no clubbing, cyanosis, + venous stasis changes with chronic edema MS- walks very slowly with a walker, very debilitated Skin- no rash or lesion Psych- euthymic mood, full affect Neuro- very unsteady today Pacemaker pocket is well healed  ekg today reveals atypical atrial flutter with V pacing Pacemaker interrogation is reviewed today (see paceart)  Assessment and Plan:  1. Persistent afib/ atypical atrial flutter On amiodarone Will proceed with cardioversion at this time.  If he remains in sinus, will continue on  amiodarone  200mg  daily.  IF he does not convert or does not have significant clinical improve with cardioversion, it may be better to stop amiodarone and use a rate control strategy long term.  Continue apixaban   2. Chronic diastolic dysfunction The importance of sodium restriction and daily weight is advised  3. Sick sinus syndrome/ complete heart block See Pace Art report stable  4. Chronic venous stasis changes His BLE edema is primarily due to obesity.  Weight loss is encouraged.  Support hose are also recommended.  5. Morbid obesity Body mass index is 45.08 kg/(m^2). I have strongly advised weight reduction.  I think that his weight will certainly impact both quality and quantity of life for him.  We discussed bariatric surgery today.  He states that he has looked into this but costs are prohibitive.  6. OSA Compliance with CPAP is encouraged  7. Hypertensive cardio-renal disease Continue aggressive management Watch closely  F/u with Truitt Merle in 1-2 months I will see when needed He may benefit from general cardiology referral ultimately  This patient is quite ill with multiple acute comordities and a high risk of decompensation/ hospitalization.  A high level of decision making was required for this encounter.

## 2015-06-19 ENCOUNTER — Other Ambulatory Visit (HOSPITAL_COMMUNITY): Payer: Self-pay | Admitting: *Deleted

## 2015-06-19 ENCOUNTER — Telehealth: Payer: Self-pay | Admitting: Internal Medicine

## 2015-06-19 NOTE — Telephone Encounter (Signed)
Message sent to [facility]- [facility] rep that the patient is scheduled for DCCV on 7/15 at 3:00 pm at [facility] with Dr. Marlou Porch per Dr. Rayann Heman.

## 2015-06-20 ENCOUNTER — Other Ambulatory Visit (HOSPITAL_COMMUNITY): Payer: Self-pay | Admitting: *Deleted

## 2015-06-22 ENCOUNTER — Ambulatory Visit (HOSPITAL_COMMUNITY)
Admission: RE | Admit: 2015-06-22 | Discharge: 2015-06-22 | Disposition: A | Discharge status: Home / Self Care | Payer: Commercial Managed Care - HMO | Source: Ambulatory Visit | Attending: Cardiology | Admitting: Cardiology

## 2015-06-22 ENCOUNTER — Ambulatory Visit (HOSPITAL_COMMUNITY): Payer: Commercial Managed Care - HMO | Admitting: Anesthesiology

## 2015-06-22 ENCOUNTER — Encounter (HOSPITAL_COMMUNITY)
Admission: RE | Disposition: A | Discharge status: Home / Self Care | Payer: Self-pay | Source: Ambulatory Visit | Attending: Cardiology

## 2015-06-22 ENCOUNTER — Encounter (HOSPITAL_COMMUNITY): Payer: Self-pay | Admitting: *Deleted

## 2015-06-22 DIAGNOSIS — I480 Paroxysmal atrial fibrillation: Secondary | ICD-10-CM | POA: Diagnosis not present

## 2015-06-22 DIAGNOSIS — G629 Polyneuropathy, unspecified: Secondary | ICD-10-CM | POA: Insufficient documentation

## 2015-06-22 DIAGNOSIS — I878 Other specified disorders of veins: Secondary | ICD-10-CM | POA: Insufficient documentation

## 2015-06-22 DIAGNOSIS — I2510 Atherosclerotic heart disease of native coronary artery without angina pectoris: Secondary | ICD-10-CM | POA: Insufficient documentation

## 2015-06-22 DIAGNOSIS — K219 Gastro-esophageal reflux disease without esophagitis: Secondary | ICD-10-CM | POA: Diagnosis not present

## 2015-06-22 DIAGNOSIS — Z7901 Long term (current) use of anticoagulants: Secondary | ICD-10-CM | POA: Diagnosis not present

## 2015-06-22 DIAGNOSIS — J449 Chronic obstructive pulmonary disease, unspecified: Secondary | ICD-10-CM | POA: Insufficient documentation

## 2015-06-22 DIAGNOSIS — F329 Major depressive disorder, single episode, unspecified: Secondary | ICD-10-CM | POA: Insufficient documentation

## 2015-06-22 DIAGNOSIS — M19049 Primary osteoarthritis, unspecified hand: Secondary | ICD-10-CM | POA: Diagnosis not present

## 2015-06-22 DIAGNOSIS — Z8249 Family history of ischemic heart disease and other diseases of the circulatory system: Secondary | ICD-10-CM | POA: Insufficient documentation

## 2015-06-22 DIAGNOSIS — I509 Heart failure, unspecified: Secondary | ICD-10-CM | POA: Insufficient documentation

## 2015-06-22 DIAGNOSIS — Z950 Presence of cardiac pacemaker: Secondary | ICD-10-CM | POA: Insufficient documentation

## 2015-06-22 DIAGNOSIS — I4891 Unspecified atrial fibrillation: Secondary | ICD-10-CM | POA: Diagnosis not present

## 2015-06-22 DIAGNOSIS — I739 Peripheral vascular disease, unspecified: Secondary | ICD-10-CM | POA: Insufficient documentation

## 2015-06-22 DIAGNOSIS — Z9889 Other specified postprocedural states: Secondary | ICD-10-CM | POA: Diagnosis not present

## 2015-06-22 DIAGNOSIS — Z6841 Body Mass Index (BMI) 40.0 and over, adult: Secondary | ICD-10-CM | POA: Diagnosis not present

## 2015-06-22 DIAGNOSIS — I481 Persistent atrial fibrillation: Secondary | ICD-10-CM | POA: Insufficient documentation

## 2015-06-22 DIAGNOSIS — F419 Anxiety disorder, unspecified: Secondary | ICD-10-CM | POA: Diagnosis not present

## 2015-06-22 DIAGNOSIS — H409 Unspecified glaucoma: Secondary | ICD-10-CM | POA: Diagnosis not present

## 2015-06-22 DIAGNOSIS — E039 Hypothyroidism, unspecified: Secondary | ICD-10-CM | POA: Diagnosis not present

## 2015-06-22 DIAGNOSIS — Z8554 Personal history of malignant neoplasm of ureter: Secondary | ICD-10-CM | POA: Insufficient documentation

## 2015-06-22 DIAGNOSIS — Z794 Long term (current) use of insulin: Secondary | ICD-10-CM | POA: Diagnosis not present

## 2015-06-22 DIAGNOSIS — I484 Atypical atrial flutter: Secondary | ICD-10-CM | POA: Insufficient documentation

## 2015-06-22 DIAGNOSIS — N189 Chronic kidney disease, unspecified: Secondary | ICD-10-CM | POA: Insufficient documentation

## 2015-06-22 DIAGNOSIS — G4733 Obstructive sleep apnea (adult) (pediatric): Secondary | ICD-10-CM | POA: Insufficient documentation

## 2015-06-22 DIAGNOSIS — I495 Sick sinus syndrome: Secondary | ICD-10-CM | POA: Diagnosis not present

## 2015-06-22 DIAGNOSIS — Z79899 Other long term (current) drug therapy: Secondary | ICD-10-CM | POA: Insufficient documentation

## 2015-06-22 DIAGNOSIS — Z8551 Personal history of malignant neoplasm of bladder: Secondary | ICD-10-CM | POA: Insufficient documentation

## 2015-06-22 DIAGNOSIS — E785 Hyperlipidemia, unspecified: Secondary | ICD-10-CM | POA: Diagnosis not present

## 2015-06-22 DIAGNOSIS — I442 Atrioventricular block, complete: Secondary | ICD-10-CM | POA: Insufficient documentation

## 2015-06-22 DIAGNOSIS — I872 Venous insufficiency (chronic) (peripheral): Secondary | ICD-10-CM | POA: Diagnosis not present

## 2015-06-22 DIAGNOSIS — E119 Type 2 diabetes mellitus without complications: Secondary | ICD-10-CM | POA: Insufficient documentation

## 2015-06-22 DIAGNOSIS — Z87891 Personal history of nicotine dependence: Secondary | ICD-10-CM | POA: Diagnosis not present

## 2015-06-22 DIAGNOSIS — I130 Hypertensive heart and chronic kidney disease with heart failure and stage 1 through stage 4 chronic kidney disease, or unspecified chronic kidney disease: Secondary | ICD-10-CM | POA: Diagnosis not present

## 2015-06-22 HISTORY — PX: CARDIOVERSION: SHX1299

## 2015-06-22 SURGERY — CARDIOVERSION
Anesthesia: General

## 2015-06-22 MED ORDER — PROPOFOL 10 MG/ML IV BOLUS
INTRAVENOUS | Status: DC | PRN
  Administered 2015-06-22: 100 mg via INTRAVENOUS

## 2015-06-22 MED ORDER — SODIUM CHLORIDE 0.9 % IV SOLN
INTRAVENOUS | Status: DC | PRN
  Administered 2015-06-22: 15:00:00 via INTRAVENOUS

## 2015-06-22 MED ORDER — LIDOCAINE HCL (CARDIAC) 20 MG/ML IV SOLN
INTRAVENOUS | Status: DC | PRN
  Administered 2015-06-22: 40 mg via INTRAVENOUS

## 2015-06-22 NOTE — Transfer of Care (Signed)
Immediate Anesthesia Transfer of Care Note  Patient: [name]  Procedure(s) Performed: Procedure(s): CARDIOVERSION (N/A)  Patient Location: PACU  Anesthesia Type:General  Level of Consciousness: awake, sedated and patient cooperative  Airway & Oxygen Therapy: Patient Spontanous Breathing and Patient connected to nasal cannula oxygen  Post-op Assessment: Report given to RN, Post -op Vital signs reviewed and stable and Patient moving all extremities  Post vital signs: Reviewed and stable  Last Vitals:  Filed Vitals:   06/22/15 1402  BP: 111/51  Temp: 36.5 C  Resp: 14    Complications: No apparent anesthesia complications

## 2015-06-22 NOTE — CV Procedure (Signed)
    Electrical Cardioversion Procedure Note [name] [id number] 1941/10/21  Procedure: Electrical Cardioversion Indications:  Atrial Fibrillation  Time Out: Verified patient identification, verified procedure,medications/allergies/relevent history reviewed, required imaging and test results available.  Performed  Procedure Details  The patient was NPO after midnight. Anesthesia was administered at the beside  by Dr. Linna Caprice with propofol.  Cardioversion was performed with synchronized biphasic defibrillation via AP pads with 200 joules.  1 attempt(s) were performed.  The patient converted to normal sinus rhythm. The patient tolerated the procedure well   IMPRESSION:  Successful cardioversion of atrial fibrillation Pacemaker interrogated Aaron Edelman)    Shellby Schlink, [place] 06/22/2015, 3:52 PM

## 2015-06-22 NOTE — Interval H&P Note (Signed)
History and Physical Interval Note:  06/22/2015 2:31 PM  [name]  has presented today for surgery, with the diagnosis of AFIB  The various methods of treatment have been discussed with the patient and family. After consideration of risks, benefits and other options for treatment, the patient has consented to  Procedure(s): CARDIOVERSION (N/A) as a surgical intervention .  The patient's history has been reviewed, patient examined, no change in status, stable for surgery.  I have reviewed the patient's chart and labs.  Questions were answered to the patient's satisfaction.     SKAINS, MARK

## 2015-06-22 NOTE — Anesthesia Postprocedure Evaluation (Signed)
  Anesthesia Post-op Note  Patient: [name]  Procedure(s) Performed: Procedure(s): CARDIOVERSION (N/A)  Patient Location: Endoscopy  Anesthesia Type:General  Level of Consciousness: awake, alert  and oriented  Airway and Oxygen Therapy: Patient Spontanous Breathing and Patient connected to nasal cannula oxygen  Post-op Pain: none  Post-op Assessment: Post-op Vital signs reviewed, Patient's Cardiovascular Status Stable, Respiratory Function Stable, Patent Airway and Pain level controlled              Post-op Vital Signs: stable  Last Vitals:  Filed Vitals:   06/22/15 1540  BP: 123/43  Pulse: 70  Temp:   Resp: 17    Complications: No apparent anesthesia complications

## 2015-06-22 NOTE — Anesthesia Preprocedure Evaluation (Addendum)
Anesthesia Evaluation  Patient identified by MRN, date of birth, ID band Patient awake    Reviewed: Allergy & Precautions, NPO status , Patient's Chart, lab work & pertinent test results, reviewed documented beta blocker date and time   History of Anesthesia Complications Negative for: history of anesthetic complications  Airway Mallampati: IV  TM Distance: >3 FB Neck ROM: Full    Dental  (+) Poor Dentition, Missing   Pulmonary shortness of breath and with exertion, sleep apnea , pneumonia -, COPD COPD inhaler, former smoker,  breath sounds clear to auscultation  Pulmonary exam normal       Cardiovascular + CAD, + Peripheral Vascular Disease and +CHF + dysrhythmias Atrial Fibrillation + pacemaker Rhythm:Irregular Rate:Abnormal     Neuro/Psych PSYCHIATRIC DISORDERS Anxiety Depression  Neuromuscular disease    GI/Hepatic GERD-  Medicated and Controlled,  Endo/Other  diabetes, Well Controlled, Type 2, Insulin DependentHypothyroidism Morbid obesity  Renal/GU CRFRenal disease     Musculoskeletal  (+) Arthritis -,   Abdominal (+) + obese,   Peds  Hematology   Anesthesia Other Findings   Reproductive/Obstetrics                        Anesthesia Physical Anesthesia Plan  ASA: IV  Anesthesia Plan: General   Post-op Pain Management:    Induction: Intravenous  Airway Management Planned: Mask and Nasal Cannula  Additional Equipment:   Intra-op Plan:   Post-operative Plan: Extubation in OR  Informed Consent: I have reviewed the patients History and Physical, chart, labs and discussed the procedure including the risks, benefits and alternatives for the proposed anesthesia with the patient or authorized representative who has indicated his/her understanding and acceptance.   Dental advisory given  Plan Discussed with: CRNA and Surgeon  Anesthesia Plan Comments:        Anesthesia Quick  Evaluation

## 2015-06-22 NOTE — Discharge Instructions (Signed)

## 2015-06-22 NOTE — H&P (View-Only) (Signed)
PCP:  Renato Shin, MD  The patient presents today for electrophysiology followup.  H/o  diastolic dysfunction. He struggles with sequelae from his morbid obesity.  He has chronic venous insufficiency and unsteadiness chronically and walks with a walker.  He has become progressively debilitated over the past few years.  He has fatigue chronically.  He has recently returned to afib though I am not convinced that he has had any clinical deterioration specifically related to this.  He has been placed on amiodarone but remains in atrial fibrillation/ atypical flutter. Today, he denies symptoms of chest pain, orthopnea, PND,  presyncope, syncope, or neurologic sequela.  The patient feels that he is tolerating medications without difficulties and is otherwise without complaint today.   Past Medical History  Diagnosis Date  . Diabetes mellitus   . COPD (chronic obstructive pulmonary disease)     CPAP  . Renal insufficiency   . Hyperlipemia   . Hypothyroidism   . Hyperkalemia   . 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 Sick sinus syndrome     s/p PPM by JA  . Pancreatitis   . Morbid obesity   . OSA on CPAP     using CPAP although it is hard for him to tolerate  . CAD (coronary artery disease)   . Hypertensive cardiovascular-renal disease   . Depression   . Anxiety   . H/O Legionnaire's disease 2003  . History of blood clots     R groin  . Peripheral neuropathy   . Osteoarthritis     fingers  . GERD (gastroesophageal reflux disease)   . Anemia     hx of  . Pneumonia 2003  . Cancer 2014    bladder cancer AND RIGHT URETERAL CANCER  . Diastolic dysfunction with chronic heart failure   . Persistent atrial fibrillation   . Atypical atrial flutter   . Venous insufficiency   . Complete heart block     cardioverted from A.Fib,hx. Sick sinus syndrome -Pacemaker implanted  . Pacemaker     implanted [employer] 7'11  . CHF (congestive heart failure)   . Gait difficulty     slow  gait"ambulates with walker"   Past Surgical History  Procedure Laterality Date  . Nasal septum surgery  1967  . Pacemaker placement  06/2010    [facility] Accent RF DR, Model [id number] ( Serial number [id number])  . Thromboembolectomy and four compartment fasciotomy  2009    GROIN AREA  . Cataract extraction Right   . Cholecystectomy  2012  . Insert / replace / remove pacemaker  2011  . Transurethral resection of bladder tumor with gyrus (turbt-gyrus) N/A 10/26/2013    Procedure: TRANSURETHRAL RESECTION OF BLADDER TUMOR WITH GYRUS (TURBT-GYRUS);  Surgeon: Alexis Frock, MD;  Location: [facility];  Service: Urology;  Laterality: N/A;  . Cystoscopy w/ ureteral stent placement Right 10/26/2013    Procedure: CYSTOSCOPY WITH RETROGRADE PYELOGRAM/URETERAL STENT PLACEMENT;  Surgeon: Alexis Frock, MD;  Location: [facility];  Service: Urology;  Laterality: Right;  . Embolectomy Left 11/02/2013    Procedure: LEFT FEMORAL EMBOLECTOMY, LEFT FEMORAL ARTERY ENDARTERECTOMY WITH DACRON PATCH ANGIOPLASTY.;  Surgeon: Mal Misty, MD;  Location: [facility];  Service: Vascular;  Laterality: Left;  . Transurethral resection of bladder tumor with gyrus (turbt-gyrus) N/A 01/11/2014    Procedure: TRANSURETHRAL RESECTION OF BLADDER TUMOR WITH GYRUS (TURBT-GYRUS);  Surgeon: Alexis Frock, MD;  Location: [facility];  Service: Urology;  Laterality: N/A;  . Cystoscopy with ureteroscopy and stent placement Right 01/11/2014    Procedure: CYSTOSCOPY WITH URETEROSCOPY ,RIGHT RETROGRADE AND STENT CHANGE AND LASER OF URETERAL TUMOR;  Surgeon: Alexis Frock, MD;  Location: [facility];  Service: Urology;  Laterality: Right;  . Holmium laser application Right 07/14/6810    Procedure: HOLMIUM LASER APPLICATION;  Surgeon: Alexis Frock, MD;  Location: [facility];  Service: Urology;  Laterality: Right;  . Tee without cardioversion N/A 05/16/2014    Procedure: TRANSESOPHAGEAL ECHOCARDIOGRAM (TEE);  Surgeon: Josue Hector, MD;  Location: [facility] END[facility];   Service: Cardiovascular;  Laterality: N/A;  . Cardioversion N/A 05/16/2014    Procedure: CARDIOVERSION;  Surgeon: Josue Hector, MD;  Location: [facility]COPY;  Service: Cardiovascular;  Laterality: N/A;  . Cardiac catheterization      11-02-13  . Cystoscopy/retrograde/ureteroscopy Right 08/23/2014    Procedure: CYSTOSCOPY/RETROGRADE/ DIAGNOSTIC URETEROSCOPY/RIGHT RENAL STONE EXTRACTION;  Surgeon: Alexis Frock, MD;  [facility];  Service: Urology;  Laterality: Right;  . Cardioversion N/A 11/08/2014    Procedure: CARDIOVERSION;  Surgeon: Fay Records, MD;  Location: [facility] ENDOSCOPY;  Service: Cardiovascular;  Laterality: N/A;    Current Outpatient Prescriptions  Medication Sig Dispense Refill  . acetaminophen (TYLENOL) 500 MG tablet Take 500 mg by mouth 2 (two) times daily as needed for moderate pain (pain).     Marland Kitchen albuterol (PROVENTIL HFA;VENTOLIN HFA) 108 (90 BASE) MCG/ACT inhaler Inhale 2 puffs into the lungs every 6 (six) hours as needed for wheezing or shortness of breath. 1 Inhaler 6  . ALPRAZolam (XANAX) 0.25 MG tablet Take 0.25 mg by mouth 3 (three) times daily as needed for anxiety.    Marland Kitchen apixaban (ELIQUIS) 5 MG TABS tablet Take 1 tablet (5 mg total) by mouth 2 (two) times daily. 180 tablet 3  . Ascorbic Acid (VITAMIN C) 500 MG tablet Take 500 mg by mouth daily.     . brimonidine (ALPHAGAN P) 0.1 % SOLN Place 1 drop into the left eye 3 (three) times daily.     . calcium carbonate (TUMS - DOSED IN MG ELEMENTAL CALCIUM) 500 MG chewable tablet Chew 2 tablets by mouth 2 (two) times daily as needed for indigestion or heartburn.    . cholecalciferol (VITAMIN D) 1000 UNITS tablet Take 1,000 Units by mouth daily.    . CRESTOR 40 MG tablet TAKE 1/2 TABLET EVERY EVENING (Patient taking differently: TAKE 1/2 TABLET BY MOUTH EVERY EVENING) 45 tablet 2  . dextromethorphan (DELSYM) 30 MG/5ML liquid Take 90 mg by mouth 2 (two) times daily as needed for cough.    . dorzolamide-timolol (COSOPT) 22.3-6.8  MG/ML ophthalmic solution Place 1 drop into both eyes 2 (two) times daily.     . fluticasone (FLONASE) 50 MCG/ACT nasal spray Place 2 sprays into both nostrils daily as needed for allergies or rhinitis.    . furosemide (LASIX) 40 MG tablet Take one tab (40mg ) in the AM, and 1/2 tab (20mg ) in the PM (Patient taking differently: Take one tab (40mg ) by mouth in the AM, and 1/2 tab (20mg ) by mouth in the PM) 135 tablet 3  . guaiFENesin (MUCINEX) 600 MG 12 hr tablet Take 600 mg by mouth 2 (two) times daily as needed for cough or to loosen phlegm.     Marland Kitchen HUMULIN R 500 UNIT/ML SOLN injection DRAW UP 50 UNITS ON U100 SYRINGE (250 ACTUAL INSULIN UNITS) AND INJECT SUBCUTANEOUSLY 4 TIMES DAILY. 80 mL 2  . Lactobacillus (ACIDOPHILUS) CAPS capsule Take 1 capsule by  mouth daily.    Marland Kitchen latanoprost (XALATAN) 0.005 % ophthalmic solution Place 1 drop into both eyes at bedtime.    Marland Kitchen levothyroxine (SYNTHROID, LEVOTHROID) 50 MCG tablet TAKE 1 TABLET EVERY DAY BEFORE BREAKFAST (Patient taking differently: TAKE 1 TABLET BY MOUTH EVERY DAY BEFORE BREAKFAST) 90 tablet 3  . metoprolol tartrate (LOPRESSOR) 25 MG tablet Take 1 tablet (25 mg total) by mouth 3 (three) times daily. 270 tablet 3  . Multiple Vitamin (MULTIVITAMIN WITH MINERALS) TABS tablet Take 1 tablet by mouth daily.    . Omega-3 Fatty Acids (FISH OIL) 1000 MG CAPS Take 1,000 mg by mouth every morning.     Marland Kitchen omeprazole (PRILOSEC) 40 MG capsule TAKE 1 CAPSULE EVERY DAY (Patient taking differently: TAKE 1 CAPSULE BY MOUTH EVERY DAY) 90 capsule 1  . senna-docusate (SENOKOT-S) 8.6-50 MG per tablet Take 1 tablet by mouth 2 (two) times daily. While taking pain meds to prevent constipation. 20 tablet 0  . traMADol (ULTRAM) 50 MG tablet Take 100 mg by mouth every 6 (six) hours as needed (back pain).    Marland Kitchen venlafaxine XR (EFFEXOR-XR) 150 MG 24 hr capsule Take 150 mg by mouth daily as needed (anxiety.).     No current facility-administered medications for this visit.     Allergies  Allergen Reactions  . Actos [Pioglitazone] Swelling    History   Social History  . Marital Status: Married    Spouse Name: N/A  . Number of Children: N/A  . 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 Smoking status: Former Smoker -- 2.00 packs/day for 50 years    Types: Cigarettes    Quit date: 12/08/2006  . Smokeless tobacco: Never Used  . Alcohol Use: No     Comment: quit 3 years ago  . Drug Use: No  . Sexual Activity: Not Currently   Other Topics Concern  . Not on file   Social History Narrative   Family History  Problem Relation Age of Onset  . Liver cancer Mother     deceased age 52  . Cancer Mother     liver cancer  . Colon cancer Neg Hx   . Heart attack Father     ROS-  All systems are reviewed and are negative except as outlined in the HPI above  Physical Exam:                   Filed Vitals:   06/18/15 1109  BP: 114/62  Pulse: 70   GEN- The patient is morbidly obese and chronically ill appearing, alert and oriented x 3 today.   Head- normocephalic, atraumatic Eyes-  Sclera clear, conjunctiva pink Ears- poor hearing (wears hearing aids) Oropharynx- clear with very dry MM Neck- supple,  Flat JVP Lungs- Clear to ausculation bilaterally, normal work of breathing Heart- irregular rate and rhythm  GI- soft, NT, ND, + BS Extremities- no clubbing, cyanosis, + venous stasis changes with chronic edema MS- walks very slowly with a walker, very debilitated Skin- no rash or lesion Psych- euthymic mood, full affect Neuro- very unsteady today Pacemaker pocket is well healed  ekg today reveals atypical atrial flutter with V pacing Pacemaker interrogation is reviewed today (see paceart)  Assessment and Plan:  1. Persistent afib/ atypical atrial flutter On amiodarone Will proceed with cardioversion at this time.  If he remains in sinus, will continue on  amiodarone  200mg  daily.  IF he does not convert or does not have significant clinical improve with cardioversion, it may be better to stop amiodarone and use a rate control strategy long term.  Continue apixaban   2. Chronic diastolic dysfunction The importance of sodium restriction and daily weight is advised  3. Sick sinus syndrome/ complete heart block See Pace Art report stable  4. Chronic venous stasis changes His BLE edema is primarily due to obesity.  Weight loss is encouraged.  Support hose are also recommended.  5. Morbid obesity Body mass index is 45.08 kg/(m^2). I have strongly advised weight reduction.  I think that his weight will certainly impact both quality and quantity of life for him.  We discussed bariatric surgery today.  He states that he has looked into this but costs are prohibitive.  6. OSA Compliance with CPAP is encouraged  7. Hypertensive cardio-renal disease Continue aggressive management Watch closely  F/u with Truitt Merle in 1-2 months I will see when needed He may benefit from general cardiology referral ultimately  This patient is quite ill with multiple acute comordities and a high risk of decompensation/ hospitalization.  A high level of decision making was required for this encounter.

## 2015-06-22 NOTE — Anesthesia Postprocedure Evaluation (Signed)
  Anesthesia Post-op Note  Patient: [name]  Procedure(s) Performed: Procedure(s): CARDIOVERSION (N/A)  Patient Location: PACU  Anesthesia Type:General  Level of Consciousness: awake, alert , oriented and patient cooperative  Airway and Oxygen Therapy: Patient Spontanous Breathing and Patient connected to nasal cannula oxygen  Post-op Pain: none  Post-op Assessment: Post-op Vital signs reviewed, Patient's Cardiovascular Status Stable and Respiratory Function Stable              Post-op Vital Signs: Reviewed and stable  Last Vitals:  Filed Vitals:   06/22/15 1402  BP: 111/51  Temp: 36.5 C  Resp: 14    Complications: No apparent anesthesia complications

## 2015-06-25 ENCOUNTER — Encounter (HOSPITAL_COMMUNITY): Payer: Self-pay | Admitting: Cardiology

## 2015-06-25 ENCOUNTER — Telehealth: Payer: Self-pay

## 2015-06-25 NOTE — Telephone Encounter (Signed)
Patient called in asking if he was to continue Amiodarone 200 mg 2x daily.  He was unsure..Patient's  chart notes say to continue taking if still in rhythm after his cardioversion 06-22-15.  He has only two days of medication left. Please advise.

## 2015-06-25 NOTE — Telephone Encounter (Signed)
Would leave on amiodarone 200 mg a day until seen back in the office.  Ok to send in [employer]

## 2015-06-26 ENCOUNTER — Telehealth: Payer: Self-pay

## 2015-06-26 ENCOUNTER — Other Ambulatory Visit: Payer: Self-pay | Admitting: *Deleted

## 2015-06-26 MED ORDER — AMIODARONE HCL 200 MG PO TABS: 200.0000 mg | ORAL_TABLET | Freq: Every day | ORAL | Status: DC

## 2015-06-26 NOTE — Telephone Encounter (Signed)
Patient  returned call of [place]. - Per patient, please leave a message on his voice mail if unable to reach him by phone.  - He is asking if he should continue (refill) his Amiodarone after his cardioversion was done.  He is out of his medication as of today.  Garren Chaney [phone]

## 2015-06-26 NOTE — Telephone Encounter (Signed)
Pt is agreeable to plan will stay on Amiodarone ( 200 mg ) daily.  Pt will call in prescription has refills left.  Pt was on ( 200 mg ) bid.  [name].

## 2015-06-28 NOTE — Addendum Note (Signed)
Addendum  created 06/28/15 2157 by Roberts Gaudy, MD   Modules edited: Anesthesia Responsible Staff

## 2015-06-29 NOTE — Patient Outreach (Signed)
[facility]) Care Management  06/29/2015  KEVIS QU 1941/11/20 [id number]   Referral from [facility] Tier 4 list, assigned to Mariann Laster, RN for patient outreach.  Jemya Depierro L. Elbert E[facility] Care Management Assistant [phone]

## 2015-07-04 ENCOUNTER — Other Ambulatory Visit: Payer: Self-pay | Admitting: Endocrinology

## 2015-07-11 ENCOUNTER — Other Ambulatory Visit: Payer: Self-pay

## 2015-07-11 NOTE — Patient Outreach (Signed)
[facility]) Care Management  07/11/2015  [name] Aug 03, 1941 [id number]  Telephonic Care Management Note  Referral Date:  06/29/15 Referral Source:  [facility] Tier 4 Referral Issues:  DM, COPD, CHF, CVD with 0 ED visits and 1 Admission.  Triage Screening outreach call #1 to patient at home #. Patient not reached.   RN CM left HIPAA compliant voice message with name and contact #.  RN CM scheduled for next outreach attempt within 1-2 weeks.   Mariann Laster, RN, BSN, [facility], CCM  [employer] Management Coordinator [phone] Office [phone] Direct [phone] Cell

## 2015-07-12 ENCOUNTER — Telehealth: Payer: Self-pay | Admitting: Endocrinology

## 2015-07-12 NOTE — Telephone Encounter (Signed)
Pt has questions about a rx for from Humulin R 500 40 mL per order [facility] wants to increase the amount to 100 mL per order so he doesn't have to do it so often

## 2015-07-13 ENCOUNTER — Other Ambulatory Visit: Payer: Self-pay

## 2015-07-13 MED ORDER — INSULIN REGULAR HUMAN (CONC) 500 UNIT/ML ~~LOC~~ SOLN: SUBCUTANEOUS | Status: DC

## 2015-07-13 NOTE — Telephone Encounter (Signed)
I contacted the pt and left a voicemail advising his rx for Humulin 500 with 100 ml quantity has been submitted. Requested a call back if the pt would like to discuss.

## 2015-07-13 NOTE — Patient Outreach (Signed)
[facility]) Care Management  07/13/2015  [name] 02-01-1941 [id number]   Inbound voice message received from patient 07/12/2015 returning call back to RN CM.  Outreach call to patient.  Patient not reached.  RN CM left HIPAA compliant voice message with name and contact #.  RN CM will follow-up with next scheduled call within one week.   Mariann Laster, RN, BSN, [facility], CCM  [employer] Management Coordinator [phone] Direct [phone] Cell [phone] Office [phone] Fax

## 2015-07-17 ENCOUNTER — Other Ambulatory Visit: Payer: Self-pay

## 2015-07-17 NOTE — Patient Outreach (Signed)
[name]) Care Management  07/17/2015  DONTRAIL BLACKWELL 1941/06/09 [id number]  Telephonic Care Management Note: Triage/Screening  Referral Date: 06/29/15 Referral Source: [facility] Tier 4 Referral Issues: DM, COPD, CHF, CVD with 0 ED visits and 1 Admission. Insurance:  [employer] PCP: [name] - last appt 3 months ago - next appt 08/2015 Admissions:  0 ED visits: 0 Procedures:  H/O Right Groin Mass excision (05/08/2015) H/O Electrical Cardioversion (06/22/15) for management of Atrial Fibrillation  Social:   Patient is married and lives in his home with his [name].  Mobility limitations with shortness of breath with activity.  Patient uses a cane in the house and a walker outside the house.   Falls:  none.   Caregiver:  [name], [name]  Transportation:  Yes  Advanced Directives, LW, HCPOA:  Yes - on file with Primary MD office:  [name]  DM BS range 42 - 450.  Self checks 3-4 times a day.  A1c:  8.2.   MD is currently working on insulin dosage modifications to improve BS readings and A1C reading.  Diet:  Patient is compliant with diet and states he has had DM for a very long time.  States weight loss is difficult due to decreased activity.  Weight:  312  Height: 5"10   CHF Patient states H/O CHF since 2013.  States he does not know too much about his HF.  States he knows more about DM and elects to focus on his CHF while with [facility] services.  Patient has home scales but is not weighing daily.   Medications:  25 medications Takes all meds as ordered and getting refills appropriately. Patient states medication cost has been a concern; just recently came out of donut hole.  States Humulin R 500 cost $1,000 dollars during donut hole.  Recently paid $345 dollars out of donut hole.  Eliquis $130.  Patient denies ever using the Eliquis coupon option.  Crestor 19.00 co-pay   Consent: Patient agreed to [facility] services. RN CM encouraged patient to  contact RN CM [phone] for any questions or concerns..   RN CM advised to please notify MD of any changes in her condition prior to scheduled appt's.   RN CM provided contact name and #, 24-hour nurse line # [phone].   RN CM confirmed patient is aware of 911 services for urgent emergency needs.  Plan: RN CM will provide CHF education.  RN CM advised patient in next follow-up for care coordination services pending research for medication assistance: Eliquis. RN CM will follow-up with patient within the next 30 days to complete Initial Assessment RN CM notified [facility] administrative assistant of case status:  Active 07/17/15 - agreed to services Level 3.  RN CM sent patient successful outreach letter with introductory package and consent. RN CM will send MD Barriers letter and Initial Assessment within the next 30 days.   Mariann Laster, RN, BSN, [facility], CCM  [employer] Management Coordinator [phone] Direct [phone] Cell [phone] Office [phone] Fax

## 2015-07-17 NOTE — Patient Outreach (Signed)
[facility]) Care Management  07/17/2015  MICHELL KADER 03/06/1941 [id number]   Successful outreach letter sent to patient.   Mariann Laster, RN, BSN, [facility], CCM  [employer] Management Coordinator [phone] Direct [phone] Cell [phone] Office [phone] Fax

## 2015-07-31 ENCOUNTER — Encounter: Payer: Self-pay | Admitting: Nurse Practitioner

## 2015-07-31 ENCOUNTER — Ambulatory Visit (INDEPENDENT_AMBULATORY_CARE_PROVIDER_SITE_OTHER): Payer: Commercial Managed Care - HMO | Admitting: Nurse Practitioner

## 2015-07-31 VITALS — BP 122/62 | HR 70 | Ht 70.0 in | Wt 312.4 lb

## 2015-07-31 DIAGNOSIS — R609 Edema, unspecified: Secondary | ICD-10-CM | POA: Diagnosis not present

## 2015-07-31 DIAGNOSIS — Z9889 Other specified postprocedural states: Secondary | ICD-10-CM | POA: Diagnosis not present

## 2015-07-31 DIAGNOSIS — I480 Paroxysmal atrial fibrillation: Secondary | ICD-10-CM | POA: Diagnosis not present

## 2015-07-31 DIAGNOSIS — I5032 Chronic diastolic (congestive) heart failure: Secondary | ICD-10-CM

## 2015-07-31 DIAGNOSIS — Z9289 Personal history of other medical treatment: Secondary | ICD-10-CM

## 2015-07-31 NOTE — Progress Notes (Signed)
CARDIOLOGY OFFICE NOTE  Date:  07/31/2015    [name] Date of Birth: 1941-01-04 Medical Record #[id number]  PCP:  Renato Shin, MD  Cardiologist:  [name]    Chief Complaint  Patient presents with  . Post cardioversion    Seen for Dr. Rayann Heman    History of Present Illness: [name] is a 74 y.o. male who presents today for a post cardioversion visit. Seen for Dr. Rayann Heman. He has a history of diastolic HF, morbid obesity, PAF with past cardioversion, venous insufficiency, CKD, anemia, hypothyroidism, DM, COPD, OSA on CPAP, HLD, SSS with PPM in place, CAD (no specifics noted), HTN and depression. On chronic anticoagulation with Eliquis.   He has had several cardioversions - last just last month in July. He is now on amiodarone. Dr. Rayann Heman and I both have felt like he is not really any more symptomatic from his baseline regardless of his rhythm but [name] has preferred to be in NSR.  Our plan is that If he remains in sinus, will continue on amiodarone 200mg  daily. IF he does not convert or does not have significant clinical improve with cardioversion, it may be better to stop amiodarone and use a rate control strategy long term.  Comes in today. Here alone.   Past Medical History  Diagnosis Date  . COPD (chronic obstructive pulmonary disease)     CPAP  . Renal insufficiency   . Hyperlipemia   . Hypothyroidism   . Hyperkalemia   . 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 Sick sinus syndrome     s/p PPM by JA  . Pancreatitis   . Morbid obesity   . OSA on CPAP     using CPAP although it is hard for him to tolerate  . CAD (coronary artery disease)   . Hypertensive cardiovascular-renal disease   . Depression   . Anxiety   . H/O Legionnaire's disease 2003  . History of blood clots     R groin  . Peripheral neuropathy   . Osteoarthritis     fingers  . GERD (gastroesophageal reflux disease)   . Anemia     hx of  . Pneumonia 2003  . Cancer 2014   bladder cancer AND RIGHT URETERAL CANCER  . Diastolic dysfunction with chronic heart failure   . Persistent atrial fibrillation   . Atypical atrial flutter   . Venous insufficiency   . Complete heart block     cardioverted from A.Fib,hx. Sick sinus syndrome -Pacemaker implanted  . Pacemaker     implanted [employer] 7'11  . CHF (congestive heart failure)   . Gait difficulty     slow gait"ambulates with walker"  . Shortness of breath dyspnea     with exertion  . Diabetes mellitus     TypeII  . History of kidney stones     Past Surgical History  Procedure Laterality Date  . Nasal septum surgery  1967  . Pacemaker placement  06/2010    [facility] Accent RF DR, Model [id number] ( Serial number [id number])  . Thromboembolectomy and four compartment fasciotomy Right 2009    leg  . Cholecystectomy  2012  . Insert / replace / remove pacemaker  2011  . Transurethral resection of bladder tumor with gyrus (turbt-gyrus) N/A 10/26/2013    Procedure: TRANSURETHRAL RESECTION OF BLADDER TUMOR WITH GYRUS (TURBT-GYRUS);  Surgeon: Alexis Frock, MD;  Location: [facility];  Service:  Urology;  Laterality: N/A;  . Cystoscopy w/ ureteral stent placement Right 10/26/2013    Procedure: CYSTOSCOPY WITH RETROGRADE PYELOGRAM/URETERAL STENT PLACEMENT;  Surgeon: Alexis Frock, MD;  Location: [facility];  Service: Urology;  Laterality: Right;  . Embolectomy Left 11/02/2013    Procedure: LEFT FEMORAL EMBOLECTOMY, LEFT FEMORAL ARTERY ENDARTERECTOMY WITH DACRON PATCH ANGIOPLASTY.;  Surgeon: Mal Misty, MD;  Location: [place];  Service: Vascular;  Laterality: Left;  . Transurethral resection of bladder tumor with gyrus (turbt-gyrus) N/A 01/11/2014    Procedure: TRANSURETHRAL RESECTION OF BLADDER TUMOR WITH GYRUS (TURBT-GYRUS);  Surgeon: Alexis Frock, MD;  Location: [facility];  Service: Urology;  Laterality: N/A;  . Cystoscopy with ureteroscopy and stent placement Right 01/11/2014    Procedure: CYSTOSCOPY WITH URETEROSCOPY  ,RIGHT RETROGRADE AND STENT CHANGE AND LASER OF URETERAL TUMOR;  Surgeon: Alexis Frock, MD;  Location: [facility];  Service: Urology;  Laterality: Right;  . Holmium laser application Right 08/15/3381    Procedure: HOLMIUM LASER APPLICATION;  Surgeon: Alexis Frock, MD;  Location: [facility];  Service: Urology;  Laterality: Right;  . Tee without cardioversion N/A 05/16/2014    Procedure: TRANSESOPHAGEAL ECHOCARDIOGRAM (TEE);  Surgeon: Josue Hector, MD;  Location: [facility];  Service: Cardiovascular;  Laterality: N/A;  . Cardioversion N/A 05/16/2014    Procedure: CARDIOVERSION;  Surgeon: Josue Hector, MD;  Location: [facility];  Service: Cardiovascular;  Laterality: N/A;  . Cardiac catheterization      11-02-13  . Cystoscopy/retrograde/ureteroscopy Right 08/23/2014    Procedure: CYSTOSCOPY/RETROGRADE/ DIAGNOSTIC URETEROSCOPY/RIGHT RENAL STONE EXTRACTION;  Surgeon: Alexis Frock, MD;  Location: [facility];  Service: Urology;  Laterality: Right;  . Cardioversion N/A 11/08/2014    Procedure: CARDIOVERSION;  Surgeon: Fay Records, MD;  Location: [place];  Service: Cardiovascular;  Laterality: N/A;  . Eye surgery Left     cataract  . Belpharoptosis repair Right     Glaucoma  . Wisdom tooth extraction    . Groin debridement Right 05/08/2015    Procedure: REMOVAL OF RIGHT GROIN MASS;  Surgeon: Angelia Mould, MD;  Location: [place];  Service: Vascular;  Laterality: Right;  . Cardioversion N/A 06/22/2015    Procedure: CARDIOVERSION;  Surgeon: Jerline Pain, MD;  Location: [facility]COPY;  Service: Cardiovascular;  Laterality: N/A;     Medications: Current Outpatient Prescriptions  Medication Sig Dispense Refill  . acetaminophen (TYLENOL) 500 MG tablet Take 500-1,000 mg by mouth 2 (two) times daily as needed for moderate pain.     Marland Kitchen albuterol (PROVENTIL HFA;VENTOLIN HFA) 108 (90 BASE) MCG/ACT inhaler Inhale 2 puffs into the lungs every 6 (six) hours as needed for wheezing or shortness of breath. 1  Inhaler 6  . ALPRAZolam (XANAX) 0.25 MG tablet Take 0.25 mg by mouth 3 (three) times daily as needed for anxiety.    Marland Kitchen amiodarone (PACERONE) 200 MG tablet Take 1 tablet (200 mg total) by mouth daily. 60 tablet 6  . Ascorbic Acid (VITAMIN C) 500 MG tablet Take 500 mg by mouth daily.     . brimonidine (ALPHAGAN P) 0.1 % SOLN Place 1 drop into the left eye 3 (three) times daily.     . calcium carbonate (TUMS - DOSED IN MG ELEMENTAL CALCIUM) 500 MG chewable tablet Chew 2 tablets by mouth 2 (two) times daily as needed for indigestion or heartburn.    . cholecalciferol (VITAMIN D) 1000 UNITS tablet Take 1,000 Units by mouth daily.    Marland Kitchen dextromethorphan (DELSYM) 30 MG/5ML liquid Take 90 mg  by mouth 2 (two) times daily as needed for cough.    . docusate sodium (COLACE) 100 MG capsule Take 100 mg by mouth daily as needed for mild constipation.    . dorzolamide-timolol (COSOPT) 22.3-6.8 MG/ML ophthalmic solution Place 1 drop into both eyes 2 (two) times daily.     Marland Kitchen ELIQUIS 5 MG TABS tablet TAKE 1 TABLET TWICE DAILY (Patient taking differently: Take 5mg  by mouth twice daily) 180 tablet 2  . fluticasone (FLONASE) 50 MCG/ACT nasal spray Place 2 sprays into both nostrils daily as needed for allergies or rhinitis.    . furosemide (LASIX) 40 MG tablet Take one tab (40mg ) in the AM, and 1/2 tab (20mg ) in the PM (Patient taking differently: Take 20-40 mg by mouth 2 (two) times daily. Take one tab (40mg ) by mouth in the AM, and 1/2 tab (20mg ) in the PM) 135 tablet 3  . guaifenesin (HUMIBID E) 400 MG TABS tablet Take 400 mg by mouth 2 (two) times daily as needed (for cough or congestion).    . insulin regular human CONCENTRATED (HUMULIN R) 500 UNIT/ML injection Inject 250 3 times just before each meal. And, 100 units 1 time per with with bedtime snack. 100 mL 1  . Lactobacillus (ACIDOPHILUS) CAPS capsule Take 1 capsule by mouth daily.    Marland Kitchen latanoprost (XALATAN) 0.005 % ophthalmic solution Place 1 drop into the left eye  at bedtime.     Marland Kitchen levothyroxine (SYNTHROID, LEVOTHROID) 50 MCG tablet TAKE 1 TABLET EVERY DAY BEFORE BREAKFAST (Patient taking differently: Take 77mcg every day before breakfast) 90 tablet 3  . metoprolol tartrate (LOPRESSOR) 25 MG tablet Take 1 tablet (25 mg total) by mouth 3 (three) times daily. 270 tablet 3  . Multiple Vitamin (MULTIVITAMIN WITH MINERALS) TABS tablet Take 1 tablet by mouth daily.    . Omega-3 Fatty Acids (FISH OIL) 1000 MG CAPS Take 1,000 mg by mouth every morning.     Marland Kitchen omeprazole (PRILOSEC) 40 MG capsule TAKE 1 CAPSULE EVERY DAY (Patient taking differently: Take 40 mg by mouth every day) 90 capsule 1  . rosuvastatin (CRESTOR) 40 MG tablet TAKE 1/2 TABLET EVERY EVENING 45 tablet 2  . traMADol (ULTRAM) 50 MG tablet Take 100 mg by mouth every 6 (six) hours as needed (back pain).     No current facility-administered medications for this visit.    Allergies: Allergies  Allergen Reactions  . Actos [Pioglitazone] Swelling    Social History: The patient  reports that he quit smoking about 8 years ago. His smoking use included Cigarettes. He has a 100 pack-year smoking history. He has never used smokeless tobacco. He reports that he does not drink alcohol or use illicit drugs.   Family History: The patient's family history includes Cancer in his mother; Heart attack in his father; Liver cancer in his mother. There is no history of Colon cancer.   Review of Systems: Please see the history of present illness.   Otherwise, the review of systems is positive for none.   All other systems are reviewed and negative.   Physical Exam: VS:  BP 122/62 mmHg  Pulse 70  Ht 5\' 10"  (1.778 m)  Wt 312 lb 6.4 oz (141.704 kg)  BMI 44.82 kg/m2 .  BMI Body mass index is 44.82 kg/(m^2).  Wt Readings from Last 3 Encounters:  07/31/15 312 lb 6.4 oz (141.704 kg)  06/18/15 313 lb (141.976 kg)  05/29/15 314 lb 6.4 oz (142.611 kg)    General: Elderly male,  morbidly obese. He is in no acute  distress.  HEENT: Normal. Neck: Supple, no JVD, carotid bruits, or masses noted.  Cardiac: Heart tones are distant. Chronic edema.  Respiratory:  Decreased breath sounds but with normal work of breathing.  GI: Soft and nontender.  MS: No deformity or atrophy. Gait and ROM intact. Using a walker.  Skin: Warm and dry. Color is normal.  Neuro:  Strength and sensation are intact and no gross focal deficits noted.  Psych: Alert, appropriate and with normal affect.   LABORATORY DATA:  EKG:  EKG is ordered today. This demonstrates A pacing with underlying NSR.  Lab Results  Component Value Date   WBC 10.9* 06/18/2015   HGB 13.6 06/18/2015   HCT 41.4 06/18/2015   PLT 247.0 06/18/2015   GLUCOSE 372* 06/18/2015   CHOL 123 08/09/2014   TRIG * 08/09/2014    470.0 Triglyceride is over 400; calculations on Lipids are invalid.   HDL 25.80* 08/09/2014   LDLDIRECT 58.9 08/09/2014   LDLCALC  05/07/2008    UNABLE TO CALCULATE IF TRIGLYCERIDE OVER 400 mg/dL        Total Cholesterol/HDL:CHD Risk Coronary Heart Disease Risk Table                     Men   Women  1/2 Average Risk   3.4   3.3   ALT 27 05/29/2015   AST 29 05/29/2015   NA 135 06/18/2015   K 4.4 06/18/2015   CL 97 06/18/2015   CREATININE 1.54* 06/18/2015   BUN 33* 06/18/2015   CO2 26 06/18/2015   TSH 3.48 05/29/2015   PSA 0.61 08/09/2014   INR 1.2* 05/29/2015   HGBA1C 8.2* 05/17/2015   MICROALBUR 170.0 Repeated and verified X2.* 06/27/2013    BNP (last 3 results) No results for input(s): BNP in the last 8760 hours.  ProBNP (last 3 results) No results for input(s): PROBNP in the last 8760 hours.   Other Studies Reviewed Today:  Electrical Cardioversion Procedure Note  IMPRESSION:  Successful cardioversion of atrial fibrillation Pacemaker interrogated Aaron Edelman)  SKAINS, [place] 06/22/2015, 3:52 PM   Echo Study Conclusions from June 2015  - Left ventricle: Wall thickness was increased in a pattern of severe LVH.  The estimated ejection fraction was 55%. - Aortic valve: There was very mild stenosis. - Mitral valve: Mildly calcified annulus. There was mild regurgitation. - Left atrium: The atrium was dilated. No evidence of thrombus in the atrial cavity or appendage. - Right atrium: No evidence of thrombus in the atrial cavity or appendage. - Atrial septum: No defect or patent foramen ovale was identified. Echo contrast study showed no right-to-left atrial level shunt, following an increase in RA pressure induced by provocative maneuvers. - Tricuspid valve: No evidence of vegetation. - Pulmonic valve: No evidence of vegetation. - Impressions: No LAA thrombus Proceeded with succesful [place] under propofol anesthesia.  Impressions:  - No LAA thrombus Proceeded with succesful [facility] under propofol anesthesia.  Assessment/Plan: 1. Paroxysmal afib - he is now on amiodarone and has been cardioverted again - EKG today showing underlying NSR. Will keep on with the current plan. See back in 3 months and check EKG and surveillance labs on return.   2. Chronic diastolic dysfunction The importance of sodium restriction and daily weight is advised. He seems to be at his baseline.   4. Sick sinus syndrome/ complete heart block Followed by Dr. Rayann Heman  5. Chronic venous stasis changes His BLE edema  is primarily due to obesity. Weight loss is encouraged. Support hose are also recommended.  6. Morbid obesity Body mass index is 45.08 kg/(m^2). He has been referred in the past to nutrition and rehab. Unfortunately, I do not see where he will be able to make real substantial changes.   7. OSA Compliance with CPAP is encouraged  8. Hypertensive cardio-renal disease  His overall prognosis is quite poor.  Current medicines are reviewed with the patient today.  The patient does not have concerns regarding medicines other than what has been noted above.  The following changes have been made:  See  above.  Labs/ tests ordered today include:    Orders Placed This Encounter  Procedures  . EKG 12-Lead     Disposition:   FU with Dr. Rayann Heman as planned.    Patient is agreeable to this plan and will call if any problems develop in the interim.   Signed: Burtis Junes, RN, ANP-C 07/31/2015 1:49 PM  [facility] [address] [place], [place]  [address] Phone: [phone] Fax: [phone]

## 2015-07-31 NOTE — Patient Instructions (Addendum)
We will be checking the following labs today - NONE  Medication Instructions:    Continue with your current medicines.     Testing/Procedures To Be Arranged:  N/A  Follow-Up:   See me in 3 months with EKG    Other Special Instructions:   N/A  Call the [place] office at [phone] if you have any questions, problems or concerns.

## 2015-08-10 ENCOUNTER — Other Ambulatory Visit: Payer: Self-pay | Admitting: Endocrinology

## 2015-08-17 ENCOUNTER — Ambulatory Visit (INDEPENDENT_AMBULATORY_CARE_PROVIDER_SITE_OTHER): Payer: Commercial Managed Care - HMO | Admitting: Endocrinology

## 2015-08-17 ENCOUNTER — Encounter: Payer: Self-pay | Admitting: Endocrinology

## 2015-08-17 VITALS — BP 130/70 | HR 84 | Temp 97.8°F | Ht 70.0 in | Wt 312.0 lb

## 2015-08-17 DIAGNOSIS — IMO0002 Reserved for concepts with insufficient information to code with codable children: Secondary | ICD-10-CM

## 2015-08-17 DIAGNOSIS — E1029 Type 1 diabetes mellitus with other diabetic kidney complication: Secondary | ICD-10-CM

## 2015-08-17 DIAGNOSIS — E1065 Type 1 diabetes mellitus with hyperglycemia: Principal | ICD-10-CM

## 2015-08-17 LAB — POCT GLYCOSYLATED HEMOGLOBIN (HGB A1C): HEMOGLOBIN A1C: 8.1

## 2015-08-17 MED ORDER — INSULIN REGULAR HUMAN (CONC) 500 UNIT/ML ~~LOC~~ SOLN
SUBCUTANEOUS | Status: DC
Start: 2015-08-17 — End: 2015-08-17

## 2015-08-17 MED ORDER — INSULIN REGULAR HUMAN (CONC) 500 UNIT/ML ~~LOC~~ SOLN: SUBCUTANEOUS | Status: DC

## 2015-08-17 NOTE — Patient Outreach (Signed)
[facility]) Care Management  08/17/2015  [name] 1941-07-21 [id number]  Care Coordination Note  Eliquis Research completed.   Eliquis Medication Assistance Application printed and mailed along with Eliquis 30 day free trail card and Eliquis co-pay assistance card.    Plan: RN CM will discuss with patient on next scheduled call.   RN CM will schedule follow-up call to patient within one week.    Mariann Laster, RN, BSN, [facility], CCM  [employer] Management Coordinator [phone] Direct [phone] Cell [phone] Office [phone] Fax

## 2015-08-17 NOTE — Patient Instructions (Addendum)
Please increase the insulin to 3 times a day (just before each meal) 300-250-250, and 100 units with your bedtime snack.  Please take these numbers, no matter what your blood sugar is.  Please come back for a follow-up appointment in 3 months.   check your blood sugar twice a day.  vary the time of day when you check, between before the 3 meals, and at bedtime.  also check if you have symptoms of your blood sugar being too high or too low.  please keep a record of the readings and bring it to your next appointment here.  You can write it on any piece of paper.  please call us sooner if your blood sugar goes below 70, or if you have a lot of readings over 200.

## 2015-08-17 NOTE — Progress Notes (Signed)
Subjective:    Patient ID: [name], male    DOB: [birth year], 74 y.o.   MRN: [id number]  HPI Pt returns for f/u of diabetes mellitus: DM type: Insulin-requiring type 2 Dx'ed: 3335 Complications: renal insufficiency, polyneuropathy, and CAD.   Therapy: insulin since 1989.  DKA: never Severe hypoglycemia: never Pancreatitis: several episodes, most recently in 2013 Other: he has severe insulin resistance; he takes multiple daily injections of U-500; variable cbg's have limited efforts at glycemic control.   Interval history: he brings a record of his cbg's which I have reviewed today.  It varies from 44-435, but most are in the 100's.  It is in general highest at lunch.  pt states he feels well in general.    Past Medical History  Diagnosis Date  . COPD (chronic obstructive pulmonary disease)     CPAP  . Renal insufficiency   . Hyperlipemia   . Hypothyroidism   . Hyperkalemia   . 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 Sick sinus syndrome     s/p PPM by JA  . Pancreatitis   . Morbid obesity   . OSA on CPAP     using CPAP although it is hard for him to tolerate  . CAD (coronary artery disease)   . Hypertensive cardiovascular-renal disease   . Depression   . Anxiety   . H/O Legionnaire's disease 2003  . History of blood clots     R groin  . Peripheral neuropathy   . Osteoarthritis     fingers  . GERD (gastroesophageal reflux disease)   . Anemia     hx of  . Pneumonia 2003  . Cancer 2014    bladder cancer AND RIGHT URETERAL CANCER  . Diastolic dysfunction with chronic heart failure   . Persistent atrial fibrillation   . Atypical atrial flutter   . Venous insufficiency   . Complete heart block     cardioverted from A.Fib,hx. Sick sinus syndrome -Pacemaker implanted  . Pacemaker     implanted St. Jude 7'11  . CHF (congestive heart failure)   . Gait difficulty     slow gait"ambulates with walker"  . Shortness of breath dyspnea     with exertion  .  Diabetes mellitus     TypeII  . History of kidney stones     Past Surgical History  Procedure Laterality Date  . Nasal septum surgery  1967  . Pacemaker placement  06/2010    [facility] Accent RF DR, Model [id number] ( Serial number [id number])  . Thromboembolectomy and four compartment fasciotomy Right 2009    leg  . Cholecystectomy  2012  . Insert / replace / remove pacemaker  2011  . Transurethral resection of bladder tumor with gyrus (turbt-gyrus) N/A 10/26/2013    Procedure: TRANSURETHRAL RESECTION OF BLADDER TUMOR WITH GYRUS (TURBT-GYRUS);  Surgeon: Alexis Frock, MD;  Location: [facility];  Service: Urology;  Laterality: N/A;  . Cystoscopy w/ ureteral stent placement Right 10/26/2013    Procedure: CYSTOSCOPY WITH RETROGRADE PYELOGRAM/URETERAL STENT PLACEMENT;  Surgeon: Alexis Frock, MD;  [facility]: [facility];  Service: Urology;  Laterality: Right;  . Embolectomy Left 11/02/2013    Procedure: LEFT FEMORAL EMBOLECTOMY, LEFT FEMORAL ARTERY ENDARTERECTOMY WITH DACRON PATCH ANGIOPLASTY.;  Surgeon: Mal Misty, MD;  Location: [facility];  Service: Vascular;  Laterality: Left;  . Transurethral resection of bladder tumor with gyrus (turbt-gyrus) N/A 01/11/2014    Procedure: TRANSURETHRAL  RESECTION OF BLADDER TUMOR WITH GYRUS (TURBT-GYRUS);  Surgeon: Alexis Frock, MD;  [facility];  Service: Urology;  Laterality: N/A;  . Cystoscopy with ureteroscopy and stent placement Right 01/11/2014    Procedure: CYSTOSCOPY WITH URETEROSCOPY ,RIGHT RETROGRADE AND STENT CHANGE AND LASER OF URETERAL TUMOR;  Surgeon: Alexis Frock, MD;  [facility];  Service: Urology;  Laterality: Right;  . Holmium laser application Right 0/08/8118    Procedure: HOLMIUM LASER APPLICATION;  Surgeon: Alexis Frock, MD;  [facility];  Service: Urology;  Laterality: Right;  . Tee without cardioversion N/A 05/16/2014    Procedure: TRANSESOPHAGEAL ECHOCARDIOGRAM (TEE);  Surgeon: Josue Hector, MD;  Location: [facility]COPY;   Service: Cardiovascular;  Laterality: N/A;  . Cardioversion N/A 05/16/2014    Procedure: CARDIOVERSION;  Surgeon: Josue Hector, MD;  Location: [facility] ENDOSCOPY;  Service: Cardiovascular;  Laterality: N/A;  . Cardiac catheterization      11-02-13  . Cystoscopy/retrograde/ureteroscopy Right 08/23/2014    Procedure: CYSTOSCOPY/RETROGRADE/ DIAGNOSTIC URETEROSCOPY/RIGHT RENAL STONE EXTRACTION;  Surgeon: Alexis Frock, MD;  [facility];  Service: Urology;  Laterality: Right;  . Cardioversion N/A 11/08/2014    Procedure: CARDIOVERSION;  Surgeon: Fay Records, MD;  Location: [facility];  Service: Cardiovascular;  Laterality: N/A;  . Eye surgery Left     cataract  . Belpharoptosis repair Right     Glaucoma  . Wisdom tooth extraction    . Groin debridement Right 05/08/2015    Procedure: REMOVAL OF RIGHT GROIN MASS;  Surgeon: Angelia Mould, MD;  Location: [facility] OR;  Service: Vascular;  Laterality: Right;  . Cardioversion N/A 06/22/2015    Procedure: CARDIOVERSION;  Surgeon: Jerline Pain, MD;  Location: [facility] ENDOSCOPY;  Service: Cardiovascular;  Laterality: N/A;    Social History   Social History  . Marital Status: Married    Spouse Name: N/A  . Number of Children: N/A  . 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 Smoking status: Former Smoker -- 2.00 packs/day for 50 years    Types: Cigarettes    Quit date: 12/08/2006  . Smokeless tobacco: Never Used  . Alcohol Use: No     Comment: quit 3 years ago  . Drug Use: No  . Sexual Activity: Not Currently   Other Topics Concern  . Not on file   Social History Narrative    Current Outpatient Prescriptions on File Prior to Visit  Medication Sig Dispense Refill  . acetaminophen (TYLENOL) 500 MG tablet Take 500-1,000 mg by mouth 2 (two) times daily as needed for moderate pain.     Marland Kitchen albuterol (PROVENTIL HFA;VENTOLIN HFA) 108 (90 BASE) MCG/ACT inhaler Inhale 2 puffs  into the lungs every 6 (six) hours as needed for wheezing or shortness of breath. 1 Inhaler 6  . ALPRAZolam (XANAX) 0.25 MG tablet Take 0.25 mg by mouth 3 (three) times daily as needed for anxiety.    Marland Kitchen amiodarone (PACERONE) 200 MG tablet Take 1 tablet (200 mg total) by mouth daily. 60 tablet 6  . Ascorbic Acid (VITAMIN C) 500 MG tablet Take 500 mg by mouth daily.     . brimonidine (ALPHAGAN P) 0.1 % SOLN Place 1 drop into the left eye 3 (three) times daily.     . calcium carbonate (TUMS - DOSED IN MG ELEMENTAL CALCIUM) 500 MG chewable tablet Chew 2 tablets by mouth 2 (two) times daily as  needed for indigestion or heartburn.    . cholecalciferol (VITAMIN D) 1000 UNITS tablet Take 1,000 Units by mouth daily.    Marland Kitchen dextromethorphan (DELSYM) 30 MG/5ML liquid Take 90 mg by mouth 2 (two) times daily as needed for cough.    . docusate sodium (COLACE) 100 MG capsule Take 100 mg by mouth daily as needed for mild constipation.    . dorzolamide-timolol (COSOPT) 22.3-6.8 MG/ML ophthalmic solution Place 1 drop into both eyes 2 (two) times daily.     Marland Kitchen ELIQUIS 5 MG TABS tablet TAKE 1 TABLET TWICE DAILY (Patient taking differently: Take 5mg  by mouth twice daily) 180 tablet 2  . fluticasone (FLONASE) 50 MCG/ACT nasal spray Place 2 sprays into both nostrils daily as needed for allergies or rhinitis.    . furosemide (LASIX) 40 MG tablet Take one tab (40mg ) in the AM, and 1/2 tab (20mg ) in the PM (Patient taking differently: Take 20-40 mg by mouth 2 (two) times daily. Take one tab (40mg ) by mouth in the AM, and 1/2 tab (20mg ) in the PM) 135 tablet 3  . guaifenesin (HUMIBID E) 400 MG TABS tablet Take 400 mg by mouth 2 (two) times daily as needed (for cough or congestion).    . Lactobacillus (ACIDOPHILUS) CAPS capsule Take 1 capsule by mouth daily.    Marland Kitchen latanoprost (XALATAN) 0.005 % ophthalmic solution Place 1 drop into the left eye at bedtime.     Marland Kitchen levothyroxine (SYNTHROID, LEVOTHROID) 50 MCG tablet TAKE 1 TABLET EVERY  DAY BEFORE BREAKFAST (Patient taking differently: Take 30mcg every day before breakfast) 90 tablet 3  . metoprolol tartrate (LOPRESSOR) 25 MG tablet Take 1 tablet (25 mg total) by mouth 3 (three) times daily. 270 tablet 3  . Multiple Vitamin (MULTIVITAMIN WITH MINERALS) TABS tablet Take 1 tablet by mouth daily.    . Omega-3 Fatty Acids (FISH OIL) 1000 MG CAPS Take 1,000 mg by mouth every morning.     Marland Kitchen omeprazole (PRILOSEC) 40 MG capsule TAKE 1 CAPSULE EVERY DAY 90 capsule 1  . rosuvastatin (CRESTOR) 40 MG tablet TAKE 1/2 TABLET EVERY EVENING 45 tablet 2  . traMADol (ULTRAM) 50 MG tablet Take 100 mg by mouth every 6 (six) hours as needed (back pain).     No current facility-administered medications on file prior to visit.    Allergies  Allergen Reactions  . Actos [Pioglitazone] Swelling    Family History  Problem Relation Age of Onset  . Liver cancer Mother     deceased age 87  . Cancer Mother     liver cancer  . Colon cancer Neg Hx   . Heart attack Father     BP 130/70 mmHg  Pulse 84  Temp(Src) 97.8 F (36.6 C) (Oral)  Ht 5\' 10"  (1.778 m)  Wt 312 lb (141.522 kg)  BMI 44.77 kg/m2  SpO2 94%    Review of Systems Denies LOC    Objective:   Physical Exam VITAL SIGNS:  See vs page GENERAL: no distress CV: dorsalis pedis pulses intact bilat.  EXTEMITIES: no deformity. no ulcer on the feet. feet are of normal color and temp. 1+ bilat leg edema. Several healed surgical scars at the right leg. There is bilateral onychomycosis. There is very dry skin andhyperpigmentation of the legs.  Neuro: sensation is intact to touch on the feet, but decreased from normal.   A1c=8.1%    Assessment & Plan:  DM: he needs increased rx. CAD: in this setting, I have to weigh hypoglycemia  vs the need to improve glycemic control.  high insulin dosage is complicating the pharmacokinetics of his insulin.    Patient is advised the following: Patient Instructions  Please increase the insulin  to 3 times a day (just before each meal) 300-250-250, and 100 units with your bedtime snack.  Please take these numbers, no matter what your blood sugar is.  Please come back for a follow-up appointment in 3 months.   check your blood sugar twice a day.  vary the time of day when you check, between before the 3 meals, and at bedtime.  also check if you have symptoms of your blood sugar being too high or too low.  please keep a record of the readings and bring it to your next appointment here.  You can write it on any piece of paper.  please call us sooner if your blood sugar goes below 70, or if you have a lot of readings over 200.

## 2015-08-22 ENCOUNTER — Other Ambulatory Visit: Payer: Self-pay

## 2015-08-23 ENCOUNTER — Telehealth: Payer: Self-pay | Admitting: Endocrinology

## 2015-08-23 DIAGNOSIS — E119 Type 2 diabetes mellitus without complications: Secondary | ICD-10-CM | POA: Diagnosis not present

## 2015-08-23 DIAGNOSIS — H4011X3 Primary open-angle glaucoma, severe stage: Secondary | ICD-10-CM | POA: Diagnosis not present

## 2015-08-23 DIAGNOSIS — Z961 Presence of intraocular lens: Secondary | ICD-10-CM | POA: Diagnosis not present

## 2015-08-23 LAB — HM DIABETES EYE EXAM

## 2015-08-23 NOTE — Telephone Encounter (Signed)
I contacted the pt and advised referral was placed on 9/9 and Annasha had contacted him and set his appointment up on 08/22/2015.

## 2015-08-23 NOTE — Telephone Encounter (Signed)
Pt needs a referral to see his eye dr, please call pt [phone]

## 2015-08-23 NOTE — Patient Outreach (Addendum)
[facility]) Care Management  08/22/2015  [name] 02/26/1941 [id number]  Telephonic Monthly Assessment Note & Care Coordination Note  Referral Date: 06/29/15 Referral Source: [facility] Tier 4 Referral Issues: DM, COPD, CHF, CVD with 0 ED visits and 1 Admission. Insurance: [employer] PCP: [name] - last appt 3 months ago - last appt 08/17/2015 [facility] Conditions:  Diastolic dysfunction with chronic heart failure, COPD, Diabetes II  Social:  Patient is married and lives in his home with his wife. Mobility limitations with shortness of breath with activity. Patient uses a cane in the house and a walker outside the home. Falls: none.  Caregiver: Wife, Lakendrick Paradis  Transportation: Yes  Advanced Directives, LW, HCPOA: Yes - on file with Primary MD office: [name] DME:  Kasandra Knudsen, walker.  DM BS range 42 - 450. Self checks 3-4 times a day. States usually run between 100 - 200.   A1c: 8.1 on 08/17/2015. BP 130/70 on 08/17/2015 MD office visit.   Patient is compliant with diet and has had DM for a very long time. States weight loss is difficult due to decreased activity. Weight: 312  Height: 5"10  Dr. [name] provided updated Diabetes management instructions on 08/17/15.  Diastolic dysfunction with chronic heart failure Patient states H/O CHF since 2013. States he does not know too much about his HF. States he knows more about DM and elects to focus on his CHF while with [facility] services.  Patient has home scales but is not weighing daily.   Medications:  Takes all meds as ordered and getting refills appropriately. Patient states medication cost has been a concern; just recently came out of donut hole. States Humulin R 500 cost $1,000 dollars during donut hole. Recently paid $345 dollars out of donut hole.  Eliquis $130 while in donut hole. Has not picked up prescription since being out of donut hole but thinks cost will be around  $50-55 dollars.  Patient denies ever using the Eliquis coupon option.  Patient requested additional cards for his wife and states his wife takes the same medication. RN CM mailed Eliquis coupon - free 30 day trial offer card and $10.00 co-pay card and Eliquis Medication Assistance Application.  Patient states he has not received yet on 08/17/15..    Consent: Patient agreed to [facility] services.   Written consent not received yet.    Plan: CHF RN CM will send daily weight log sheet RN CM sent [employer]: -Red, Yellow, Green Heart Zones chart -Blood Pressure Monitors for home -What is heart Failure? -Heart Failure: Keeping Track of Your Weight Each Day. -Heart Failure - Home Monitoring RN CM sent [facility] patient information packet:  Living Better with Heart Failure.   Medication:  Eliquis RN CM will mail additional Eliquis cards for patient's wife.  RN CM will schedule additional care coordination call to follow-up and review mailed materials for Eliquis.  RN CM will follow-up for care coordination services:  Eliquis medication cost.  RN CM will follow-up with patient within the next 30 days for telephonic monthly assessment  RN CM will send patient 2nd written consent request 08/22/15.  RN CM will send MD Barriers letter and Initial Assessment to Primary MD:  Dr. [name].   Consent: RN CM will mail 2nd request for written consent to patient 08/22/15. RN CM encouraged patient to contact RN CM at [phone] for any questions or concerns..  RN CM advised to please notify MD of any changes in her  condition prior to scheduled appt's.  RN CM provided contact name and #, 24-hour nurse line # [phone].  RN CM confirmed patient is aware of 911 services for urgent emergency needs  MD Communication: RN CM will send primary MD: Dr. [name] Barriers Letter and Initial Assessment.   Mariann Laster, RN, BSN, [facility], CCM  [employer] Management  Coordinator [phone] Direct [phone] Cell [phone] Office [phone] Fax

## 2015-08-27 ENCOUNTER — Other Ambulatory Visit: Payer: Self-pay

## 2015-08-27 NOTE — Patient Outreach (Addendum)
[facility]) Care Management  08/27/2015  [name] [birth year] [id number]    Telephonic Assessment Note & Care Coordination Note Last Telephonic Monthly Assessment: 08/22/15  Referral Date: 06/29/15 Referral Source: [facility] Tier 4 Referral Issues: DM, COPD, CHF, CVD with 0 ED visits and 1 Admission. Insurance: [employer] Medicare PCP: [name] - last appt 3 months ago - last appt 08/17/2015 Eye MD:  Last appt 08/22/15 - next appt 09/2015 [facility] Conditions: Diastolic dysfunction with chronic heart failure, COPD, Diabetes II  Social:  Patient is married and lives in his home with his wife. Mobility limitations with shortness of breath with activity. Patient uses a cane in the house and a walker outside the home. Falls: none.  Caregiver: Wife, Toure Edmonds  Transportation: Yes  Advanced Directives, LW, HCPOA: Yes - on file with Primary MD office: [name] DME: Kasandra Knudsen, walker.  DM BS range 42 - 450. Self checks 3-4 times a day. BS runs between 100 - 200.  A1c: 8.1 on 08/17/2015. BP 130/70 on 08/17/2015 MD office visit.   Patient is compliant with diet and has had DM for a very long time. Weight loss is difficult due to decreased activity. Weight: 312  Height: 5"10  Glucometer:  Provided by [employer] approximately 2015.  Patient is not sure of the brand name.   Eye appt:  Dr. Kathlen Mody - last appt 08/22/15 and next appt 09/2015 to assess increase in Left eye pressure.  Dr. [name] provided updated Diabetes management instructions on 08/17/15.  Diastolic dysfunction with chronic heart failure Patient states H/O CHF since 2013. States he does not know too much about his HF. States he knows more about DM and elects to focus on his CHF while with [facility] services.  Patient has home scales but is not weighing daily.  RN CM mailed  -daily weight and BP log sheet 08/22/15 RN CM mailed [employer] 08/22/15: -Red, Yellow, Green  Heart Zones chart -Blood Pressure Monitors for home -What is heart Failure? -Heart Failure: Keeping Track of Your Weight Each Day. -Heart Failure - Home Monitoring RN CM sent [facility] patient information packet: Living Better with Heart Failure 08/22/15. Patient has not had a chance to review educational mailings yet but review over the next 30 days.    Medications:  Takes all meds as ordered and getting refills appropriately.   Medication cost has been a concern; just recently came out of donut hole. States Humulin R 500 cost $1,000 dollars during donut hole. Recently paid $345 dollars out of donut hole.  Eliquis $130 while in donut hole. Has not picked up prescription since being out of donut hole but thinks cost will be around $50-55 dollars. Patient denies ever using the Eliquis coupon option.  Patient requested additional cards for his wife and states his wife takes the same medication (mailed 08/22/15). RN CM mailed Eliquis coupon - free 30 day trial offer card and $10.00 co-pay card and Eliquis Medication Assistance Application. (Patient has received).   Patient states he called [employer] who stated they do not honor any coupons.  Patient denies ever applying for "Extra Help" application.  States applied for other assistance with pharmaceutical company's but was never eligible for help.   Patient agrees to application.   Consent: Patient agreed to [facility] services. Written consent not received yet. 2nd consent copy mailed to patient on 08/22/15.    Plan: CHF:   RN CM will review education mailings on next scheduled monthly  assessment call after patient has had more time to review materials.   Medication:  RN CM provided instruction on use of Eliquis 30 day free card.  Advised that MD will need to write 30 day Rx only for this coupon and take to local pharmacy for dispensing of 30 days only.  Advised to notify [employer] he has picked up 30 days free at local pharmacy so they will not think  patient is non-adherent to Rx refill for that month.    Advised that 10.00 co-pay card will not work due to having state plan; however to hold Eliquis application in the event patient goes into donut year again next year he will be reminded he has option of medication assistance if patient meets eligibility.  Eliquis - RN CM will follow-up on 30 day free Eliquis prescription fill and discuss co-pay cost.  RN CM sent referral to [place] for "Extra Help" application.   Consent:   RN CM provided instruction on completion and requested to mail back toTHN.   RN CM will follow-up with patient within the next 30 days for telephonic monthly assessment  RN CM encouraged patient to contact RN CM at [phone] for any questions or concerns..  RN CM advised to please notify MD of any changes in her condition prior to scheduled appt's.  RN CM provided contact name and #, 24-hour nurse line # [phone].  RN CM confirmed patient is aware of 911 services for urgent emergency needs.  Mariann Laster, RN, BSN, [facility], CCM  [employer] Management Coordinator [phone] Direct [phone] Cell [phone] Office [phone] Fax

## 2015-09-01 ENCOUNTER — Other Ambulatory Visit: Payer: Self-pay | Admitting: Endocrinology

## 2015-09-04 NOTE — Patient Outreach (Signed)
[facility]) Care Management  09/04/2015  DILLYN MENNA 11/15/1941 [id number]   Request received from Mariann Laster, RN to contact patient to assist in applying for extra help through social security. I outreached to patient and I had to leave a HIPPA compliant message for him to call me back.   Damita L. Rhodie, [place] Care Management Assistant

## 2015-09-05 ENCOUNTER — Other Ambulatory Visit: Payer: Self-pay | Admitting: Endocrinology

## 2015-09-05 NOTE — Patient Outreach (Signed)
[facility]) Care Management  09/05/2015  BRAEDEN KENNAN 12/03/1941 [id number]   Telephone outreach to patient to complete an extra help application. I called Mr. Warehime today to enroll him into Extra Help.  He was willing to do it over the phone.  We completed the application online and submitted it today.  He should hear from social security administration in the next 2 to 4 weeks.  He is going to call me when he hears from them. I will follow up in 4 weeks.  Damita L. Rhodie, [place] Care Management Assistant

## 2015-09-17 ENCOUNTER — Encounter: Payer: Self-pay | Admitting: Internal Medicine

## 2015-09-17 ENCOUNTER — Telehealth: Payer: Self-pay | Admitting: Endocrinology

## 2015-09-17 ENCOUNTER — Ambulatory Visit (INDEPENDENT_AMBULATORY_CARE_PROVIDER_SITE_OTHER): Payer: Commercial Managed Care - HMO | Admitting: *Deleted

## 2015-09-17 DIAGNOSIS — I495 Sick sinus syndrome: Secondary | ICD-10-CM | POA: Diagnosis not present

## 2015-09-17 NOTE — Telephone Encounter (Signed)
Pt needs humana gold referral to see Dr Quentin Ore with [facility] 09/27/15

## 2015-09-18 NOTE — Progress Notes (Signed)
Remote pacemaker transmission.   

## 2015-09-19 NOTE — Telephone Encounter (Signed)
[name] from [place] stated that the pt does not need a referral.His referral on file expires in March 2017 with 5 more visits approved.

## 2015-09-20 ENCOUNTER — Other Ambulatory Visit: Payer: Commercial Managed Care - HMO

## 2015-09-20 NOTE — Patient Outreach (Signed)
[facility]) Care Management  09/20/2015  [name] [birth year] [id number]   Outreach call #1 for Initial Assessment.  Patient not reached.  RN CM left HIPAA compliant voice message with name & number.  RN CM will schedule for next outreach call within one week.   Mariann Laster, RN, BSN, [facility], CCM  [employer] Management Coordinator [phone] Direct [phone] Cell [phone] Office [phone] Fax

## 2015-09-21 LAB — CUP PACEART REMOTE DEVICE CHECK
Battery Remaining Percentage: 73 %
Battery Voltage: 2.92 V
Brady Statistic AP VP Percent: 99 %
Brady Statistic AS VS Percent: 1 %
Brady Statistic RV Percent Paced: 99 %
Date Time Interrogation Session: 20161010074853
Implantable Lead Implant Date: 20110705
Implantable Lead Location: 753859
Lead Channel Impedance Value: 340 Ohm
Lead Channel Impedance Value: 450 Ohm
Lead Channel Pacing Threshold Amplitude: 0.75 V
Lead Channel Pacing Threshold Pulse Width: 0.5 ms
Lead Channel Sensing Intrinsic Amplitude: 3.3 mV
Lead Channel Setting Pacing Pulse Width: 0.5 ms
Lead Channel Setting Sensing Sensitivity: 4 mV
MDC IDC LEAD IMPLANT DT: 20110705
MDC IDC LEAD LOCATION: 753860
MDC IDC MSMT BATTERY REMAINING LONGEVITY: 77 mo
MDC IDC MSMT LEADCHNL RA PACING THRESHOLD PULSEWIDTH: 0.5 ms
MDC IDC MSMT LEADCHNL RV PACING THRESHOLD AMPLITUDE: 0.75 V
MDC IDC MSMT LEADCHNL RV SENSING INTR AMPL: 9.3 mV
MDC IDC PG SERIAL: 7152830
MDC IDC SET LEADCHNL RA PACING AMPLITUDE: 1.75 V
MDC IDC SET LEADCHNL RV PACING AMPLITUDE: 1 V
MDC IDC STAT BRADY AP VS PERCENT: 1 %
MDC IDC STAT BRADY AS VP PERCENT: 1 %
MDC IDC STAT BRADY RA PERCENT PACED: 99 %

## 2015-09-26 ENCOUNTER — Ambulatory Visit: Payer: Commercial Managed Care - HMO

## 2015-09-27 DIAGNOSIS — H401113 Primary open-angle glaucoma, right eye, severe stage: Secondary | ICD-10-CM | POA: Diagnosis not present

## 2015-09-27 DIAGNOSIS — H401123 Primary open-angle glaucoma, left eye, severe stage: Secondary | ICD-10-CM | POA: Diagnosis not present

## 2015-10-09 ENCOUNTER — Other Ambulatory Visit: Payer: Self-pay

## 2015-10-09 MED ORDER — "INSULIN SYRINGE-NEEDLE U-100 30G X 5/16"" 1 ML MISC": Status: DC

## 2015-10-19 ENCOUNTER — Other Ambulatory Visit: Payer: Self-pay

## 2015-10-19 ENCOUNTER — Encounter: Payer: Self-pay | Admitting: Cardiology

## 2015-10-19 NOTE — Patient Outreach (Addendum)
[facility]) Care Management  10/19/2015  [name] 1941-07-14 [id number]   Outreach call #2 for telephonic monthly assessment. Patient not reached.  Plan:  RN CM left HIPAA compliant voice message with name & number requesting call back.  RN CM will schedule for next outreach call within one week.   Mariann Laster, RN, BSN, [facility] CCM  [employer] Management Coordinator [phone] Direct [phone] Cell [phone] Office [phone] Fax

## 2015-10-23 ENCOUNTER — Other Ambulatory Visit: Payer: Self-pay

## 2015-10-23 VITALS — Wt 312.0 lb

## 2015-10-23 DIAGNOSIS — I5032 Chronic diastolic (congestive) heart failure: Secondary | ICD-10-CM

## 2015-10-23 NOTE — Patient Outreach (Signed)
[facility]) Care Management  10/23/2015  [name] 1941-03-29 [id number]  Telephonic Monthly Assessment  Referral Date: 06/29/15 Referral Source: [facility] Tier 4 Referral Issues: DM, COPD, CHF, CVD with 0 ED visits and 1 Admission.  Providers:  PCP: [name] - last appt 08/17/2015 - next appt 10/2015. Eye MD: Last appt 09/2015 Insurance: [employer] Medicare  Social:  Patient is married and lives in his home with his wife. Mobility limitations with shortness of breath with activity. Patient states his mobility has decreased over the past month and has started using a walker.   Falls: none.  Caregiver: Wife, [name]  Transportation: Yes/wife drives Financial controller, [employer], HCPOA: Yes - on file with Primary MD office: [name] DME: Kasandra Knudsen, walker, glucometer provided by [employer] approximately 2015. Patient is not sure of the brand name..   Diastolic dysfunction with chronic heart failure(2013) Patient has home scales but is not weighing daily. RN CM reviewed the following:    -daily weight and BP log sheet 08/22/15 Emmi Educational 08/22/15: -Red, Yellow, Green Heart Zones chart -Blood Pressure Monitors for home -What is heart Failure? -Heart Failure: Keeping Track of Your Weight Each Day. -Heart Failure - Home Monitoring -Living Better with Heart Failure 08/22/15.  DM Self checks 3-4 times a day. BS normally run between 100 - 140 but have been as high or low as 40 to over 200.  A1c: 8.1 and BP 130/70 on 08/17/2015 MD office visit.   Patient is compliant with diet and has had DM for a very long time. Weight loss is difficult due to decreased activity. Weight: 312  Height: 5"10   Medications:  Patient confirms receipt of letter advising patient he is in-eligible for Medicare "Extra Help." Patient states his Humulin R/500 will not longer be covered by [facility] as of 12/09/2015 and will need to find a  replacement for this medication.  Patient plans on discussing with his MD on next visit scheduled for 10/2015.  Patient C/O medication cost and agreed to [facility] Referral for medication review to assess for any possible changes to lower medication cost.  Eliquis:  Patient received Eliquis coupon but has not attempted to fill.  States he elects to wait until he sees his MD on 10/2015 appt to get a new prescription.   Consent: Patient agreed to continue with [facility] services.   Plan: Screening 07/17/2015 Case Management Start date:  07/17/15  - 10/23/2015 - Program Heart Failure  Initial Assessment 08/22/2015  [facility] Referral -Medication Cost  RN CM sent update to [place] Management Assistant regarding update on  "Extra Help" application outcome.   RN CM identifies no further RN CM needs and will close to CM services effective 10/23/2015 Health Coach Referral sent for [facility] Conditions:  DM, HF.  RN CM encouraged patient to contact RN CM at [phone] for any questions or concerns..  RN CM advised to please notify MD of any changes in her condition prior to scheduled appt's.  RN CM provided contact name and #, 24-hour nurse line # [phone].  RN CM confirmed patient is aware of 911 services for urgent emergency needs.  Mariann Laster, RN, BSN, [facility], CCM  [employer] Management Coordinator [phone] Direct [phone] Cell [phone] Office [phone] Fax

## 2015-10-24 ENCOUNTER — Other Ambulatory Visit: Payer: Self-pay | Admitting: Endocrinology

## 2015-10-25 ENCOUNTER — Other Ambulatory Visit: Payer: Self-pay | Admitting: *Deleted

## 2015-10-25 NOTE — Patient Outreach (Signed)
[place] [facility]) Care Management  July 29, 202016  LAKENDRICK TOMASELLI [birth year] [id number]   Notification received from Mariann Laster, RN that patient received decision letter from social security regarding extra help application and patient was denied. I am happy to assist in any future pharmacy assistance needs that may arise. I am removing myself from the care team.  Cledis Sohn L. Latriece Anstine, [place] Care Management Assistant

## 2015-10-25 NOTE — Patient Outreach (Signed)
[place] [facility]) Care Management  27-Jun-202016  [name] October 07, 1941 [id number]   RN Health Coach telephone call to patient.  H[employer] compliance verified. Patient was cooking breakfast. Patient is very short  Of breath. RN Health Coach asked patient if he has some sweling in lower extremities. Per patient he stated the he always has some swelling in his lower extremities. Per patient it would be worse if he didn't take his fluid pills. Patient was getting ready to eat so I explained to patient that I would call him back.  [place] Care Management [phone]

## 2015-10-26 ENCOUNTER — Other Ambulatory Visit: Payer: Self-pay | Admitting: Pharmacist

## 2015-10-26 NOTE — Patient Outreach (Signed)
[place] [facility]) Care Management  [place]   10/26/2015  EDAN OLTMANN 1941/05/07 [id number]  Subjective: [name] is a 74 y.o. male who was referred to [place] for medication assistance.   Patient currently uses [facility] Medicare Part D. He is not eligible for Medicare Extra Help.   He uses Humulin R U-500 insulin. He was told that it would not be covered by [facility] in January 2017. He also reports difficulty affording his medications overall.   He reports that he was sent a letter with the covered medications through [facility]. When he called the [facility] customer rep, he was told that U-500 was not one of the covered medications.   He gets most of his medications through mail order but there are a few that [facility]. He feels that he can afford his medications even though they are expensive. The two most expensive medications are Eliquis and the insulin. He has a coupon to use for Eliquis.   Objective:   Current Medications: Current Outpatient Prescriptions  Medication Sig Dispense Refill  . ACCU-CHEK AVIVA PLUS test strip USE 3 TIMES PER DAY.  300 each 2  . acetaminophen (TYLENOL) 500 MG tablet Take 500-1,000 mg by mouth 2 (two) times daily as needed for moderate pain.     Marland Kitchen albuterol (PROVENTIL HFA;VENTOLIN HFA) 108 (90 BASE) MCG/ACT inhaler Inhale 2 puffs into the lungs every 6 (six) hours as needed for wheezing or shortness of breath. 1 Inhaler 6  . ALPRAZolam (XANAX) 0.25 MG tablet TAKE 1 TABLET BY MOUTH 3 TIMES A DAY AS NEEDED FOR ANXIETY OR SLEEP 50 tablet 2  . amiodarone (PACERONE) 200 MG tablet Take 1 tablet (200 mg total) by mouth daily. 60 tablet 6  . Ascorbic Acid (VITAMIN C) 500 MG tablet Take 500 mg by mouth daily.     . brimonidine (ALPHAGAN P) 0.1 % SOLN Place 1 drop into the left eye 3 (three) times daily.     . calcium carbonate (TUMS - DOSED IN MG ELEMENTAL CALCIUM) 500 MG chewable tablet Chew 2 tablets by mouth 2 (two)  times daily as needed for indigestion or heartburn.    . cholecalciferol (VITAMIN D) 1000 UNITS tablet Take 1,000 Units by mouth daily.    Marland Kitchen dextromethorphan (DELSYM) 30 MG/5ML liquid Take 90 mg by mouth 2 (two) times daily as needed for cough.    . docusate sodium (COLACE) 100 MG capsule Take 100 mg by mouth daily as needed for mild constipation.    . dorzolamide-timolol (COSOPT) 22.3-6.8 MG/ML ophthalmic solution Place 1 drop into both eyes 2 (two) times daily.     Marland Kitchen ELIQUIS 5 MG TABS tablet TAKE 1 TABLET TWICE DAILY (Patient taking differently: Take 5mg  by mouth twice daily) 180 tablet 2  . fluticasone (FLONASE) 50 MCG/ACT nasal spray Place 2 sprays into both nostrils daily as needed for allergies or rhinitis.    . furosemide (LASIX) 40 MG tablet Take one tab (40mg ) in the AM, and 1/2 tab (20mg ) in the PM (Patient taking differently: Take 20-40 mg by mouth 2 (two) times daily. Take one tab (40mg ) by mouth in the AM, and 1/2 tab (20mg ) in the PM) 135 tablet 3  . guaifenesin (HUMIBID E) 400 MG TABS tablet Take 400 mg by mouth 2 (two) times daily as needed (for cough or congestion).    . insulin regular human CONCENTRATED (HUMULIN R) 500 UNIT/ML injection 3 times a day (just before  each meal) 300-250-250, and 100 units with your bedtime snack 180 mL 3  . Insulin Syringe-Needle U-100 30G X 5/16" 1 ML MISC Use to inject insulin. 500 each 2  . Lactobacillus (ACIDOPHILUS) CAPS capsule Take 1 capsule by mouth daily.    Marland Kitchen latanoprost (XALATAN) 0.005 % ophthalmic solution Place 1 drop into the left eye at bedtime.     Marland Kitchen levothyroxine (SYNTHROID, LEVOTHROID) 50 MCG tablet TAKE 1 TABLET EVERY DAY BEFORE BREAKFAST (Patient taking differently: Take 42mcg every day before breakfast) 90 tablet 3  . metoprolol tartrate (LOPRESSOR) 25 MG tablet Take 1 tablet (25 mg total) by mouth 3 (three) times daily. 270 tablet 3  . Multiple Vitamin (MULTIVITAMIN WITH MINERALS) TABS tablet Take 1 tablet by mouth daily.    .  Omega-3 Fatty Acids (FISH OIL) 1000 MG CAPS Take 1,000 mg by mouth every morning.     Marland Kitchen omeprazole (PRILOSEC) 40 MG capsule TAKE 1 CAPSULE EVERY DAY 90 capsule 1  . rosuvastatin (CRESTOR) 40 MG tablet TAKE 1/2 TABLET EVERY EVENING 45 tablet 2  . traMADol (ULTRAM) 50 MG tablet TAKE 2 TABLETS BY MOUTH TWICE A DAY AS NEEDED 50 tablet 2   No current facility-administered medications for this visit.    Functional Status: In your present state of health, do you have any difficulty performing the following activities: 10/23/2015 07/17/2015  Hearing? - N  Vision? - N  Difficulty concentrating or making decisions? - N  Walking or climbing stairs? - Y  Dressing or bathing? - N  Doing errands, shopping? Y N  Preparing Food and eating ? - N  Using the Toilet? - N  In the past six months, have you accidently leaked urine? - N  Do you have problems with loss of bowel control? - N  Managing your Finances? - N  Housekeeping or managing your Housekeeping? - N    Fall/Depression Screening: PHQ 2/9 Scores 08/23/2015 07/17/2015  PHQ - 2 Score 0 0    Assessment: 1. Medication assistance: Humulin R U-500 will be covered by [facility] as there are no like alternatives to concentrated insulin. However, other types of Humulin and Humalog will not be covered and Novolog will be the preferred brand. Therefore, I think that there may have been some confusion. Also, patient is not eligible for Medicare Extra Help and patient assistance programs per his report. Could benefit from Mail Order pharmacy through [facility] so that he can get his generic medications (at least some of them) for a $0 copay though most of them already come through mail order.  Plan: 1. Medication assistance: patient to continue using U-500 insulin. It may need prior authorization in January 2017 but it should be covered. He has the contact information of Deanne Coffer, PharmD, in case he needs assistance in January. Patient will also contact us with any  other pharmacy needs. For now, he is able to afford his medications.  Will close out of pharmacy program at this time.    Nicoletta Ba, PharmD, [facility] [phone]

## 2015-10-29 ENCOUNTER — Other Ambulatory Visit: Payer: Self-pay | Admitting: Nurse Practitioner

## 2015-10-31 ENCOUNTER — Ambulatory Visit: Payer: Commercial Managed Care - HMO | Admitting: Nurse Practitioner

## 2015-11-06 ENCOUNTER — Encounter: Payer: Self-pay | Admitting: Cardiology

## 2015-11-08 ENCOUNTER — Encounter: Payer: Self-pay | Admitting: *Deleted

## 2015-11-08 ENCOUNTER — Other Ambulatory Visit: Payer: Self-pay | Admitting: *Deleted

## 2015-11-08 NOTE — Patient Outreach (Signed)
[place] [facility]) Care Management  11/08/2015  CRISTIANO AKKERMAN 1941/04/10 [id number]   RN Health Coach telephone call to patient.  Hipaa compliance verified. Per patient he was diagnosed a few years ago with Congestive heart failure. He does ambulate with a cane in the house and a walker when he goes somewhere. Patient reported numbness in toes and feet. Patient does weigh himself daily and records. Per patient he does not take his blood pressure at home. Per patient he knows the zones but he didn't know the action plan. Per patient he is taking his medication as prescribed. Patient does have a history of glaucoma. Patient agreed to follow up outreach calls.   Assessment Patient is aware of Zones and need additional education on action plans. Patient would benefit from Health Coach telephonic outreach for education and support for congestive heart failure self management.  Plan  RN Health Coach will provide ongoing education for patient for congestive heart failure through phone calls and sending printed information to patient for further discussion. RN Health Coach will send patient EMMI on How to be salt smart [place] will send patient information on when to call the doctor or [place] coach will send patient a High and Low salt food chart [place] will send patient information on how to read labels RN Health coach will send patient information on cold and flu symptoms when to call doctor Patient will continue daily weights and recording RN Health will follow up with patient within a month for further discussion of information received.  [place] Care Management [phone]

## 2015-11-15 ENCOUNTER — Other Ambulatory Visit: Payer: Self-pay | Admitting: Internal Medicine

## 2015-11-16 ENCOUNTER — Encounter: Payer: Self-pay | Admitting: Nurse Practitioner

## 2015-11-16 ENCOUNTER — Ambulatory Visit (INDEPENDENT_AMBULATORY_CARE_PROVIDER_SITE_OTHER): Payer: Commercial Managed Care - HMO | Admitting: Nurse Practitioner

## 2015-11-16 ENCOUNTER — Encounter: Payer: Self-pay | Admitting: Endocrinology

## 2015-11-16 ENCOUNTER — Other Ambulatory Visit: Payer: Self-pay | Admitting: *Deleted

## 2015-11-16 ENCOUNTER — Ambulatory Visit (INDEPENDENT_AMBULATORY_CARE_PROVIDER_SITE_OTHER): Payer: Commercial Managed Care - HMO | Admitting: Endocrinology

## 2015-11-16 VITALS — BP 108/48 | HR 74 | Ht 70.0 in | Wt 319.0 lb

## 2015-11-16 VITALS — BP 132/62 | HR 82 | Temp 97.6°F | Ht 70.0 in | Wt 318.0 lb

## 2015-11-16 DIAGNOSIS — R0600 Dyspnea, unspecified: Secondary | ICD-10-CM

## 2015-11-16 DIAGNOSIS — I5032 Chronic diastolic (congestive) heart failure: Secondary | ICD-10-CM | POA: Diagnosis not present

## 2015-11-16 DIAGNOSIS — E1065 Type 1 diabetes mellitus with hyperglycemia: Secondary | ICD-10-CM | POA: Diagnosis not present

## 2015-11-16 DIAGNOSIS — E1022 Type 1 diabetes mellitus with diabetic chronic kidney disease: Secondary | ICD-10-CM | POA: Diagnosis not present

## 2015-11-16 DIAGNOSIS — N183 Chronic kidney disease, stage 3 (moderate): Secondary | ICD-10-CM

## 2015-11-16 DIAGNOSIS — N189 Chronic kidney disease, unspecified: Secondary | ICD-10-CM

## 2015-11-16 DIAGNOSIS — I509 Heart failure, unspecified: Secondary | ICD-10-CM | POA: Diagnosis not present

## 2015-11-16 DIAGNOSIS — I480 Paroxysmal atrial fibrillation: Secondary | ICD-10-CM | POA: Diagnosis not present

## 2015-11-16 DIAGNOSIS — I4891 Unspecified atrial fibrillation: Secondary | ICD-10-CM | POA: Diagnosis not present

## 2015-11-16 LAB — HEPATIC FUNCTION PANEL
ALT: 23 U/L (ref 9–46)
AST: 23 U/L (ref 10–35)
Albumin: 3.8 g/dL (ref 3.6–5.1)
Alkaline Phosphatase: 88 U/L (ref 40–115)
Bilirubin, Direct: 0.1 mg/dL (ref <=0.2)
Indirect Bilirubin: 0.3 mg/dL (ref 0.2–1.2)
Total Bilirubin: 0.4 mg/dL (ref 0.2–1.2)
Total Protein: 7.2 g/dL (ref 6.1–8.1)

## 2015-11-16 LAB — BASIC METABOLIC PANEL
BUN: 41 mg/dL — ABNORMAL HIGH (ref 7–25)
CO2: 17 mmol/L — ABNORMAL LOW (ref 20–31)
Calcium: 8.9 mg/dL (ref 8.6–10.3)
Chloride: 101 mmol/L (ref 98–110)
Creat: 1.8 mg/dL — ABNORMAL HIGH (ref 0.70–1.18)
Glucose, Bld: 332 mg/dL — ABNORMAL HIGH (ref 65–99)
Potassium: 4.4 mmol/L (ref 3.5–5.3)
Sodium: 137 mmol/L (ref 135–146)

## 2015-11-16 LAB — CBC
HCT: 42.5 % (ref 39.0–52.0)
Hemoglobin: 14 g/dL (ref 13.0–17.0)
MCH: 31.7 pg (ref 26.0–34.0)
MCHC: 32.9 g/dL (ref 30.0–36.0)
MCV: 96.2 fL (ref 78.0–100.0)
MPV: 10.2 fL (ref 8.6–12.4)
Platelets: 230 10*3/uL (ref 150–400)
RBC: 4.42 MIL/uL (ref 4.22–5.81)
RDW: 15.4 % (ref 11.5–15.5)
WBC: 8.8 10*3/uL (ref 4.0–10.5)

## 2015-11-16 LAB — BRAIN NATRIURETIC PEPTIDE: Brain Natriuretic Peptide: 56.6 pg/mL (ref 0.0–100.0)

## 2015-11-16 LAB — TSH: TSH: 4.448 u[IU]/mL (ref 0.350–4.500)

## 2015-11-16 LAB — POCT GLYCOSYLATED HEMOGLOBIN (HGB A1C): Hemoglobin A1C: 7

## 2015-11-16 MED ORDER — APIXABAN 5 MG PO TABS: 5.0000 mg | ORAL_TABLET | Freq: Two times a day (BID) | ORAL | Status: DC

## 2015-11-16 MED ORDER — INSULIN REGULAR HUMAN (CONC) 500 UNIT/ML ~~LOC~~ SOLN: SUBCUTANEOUS | Status: DC

## 2015-11-16 NOTE — Progress Notes (Signed)
CARDIOLOGY OFFICE NOTE  Date:  11/16/2015    [name] Date of Birth: 1941/09/11 Medical Record [id number]  PCP:  Renato Shin, MD  Cardiologist:  [place]    Chief Complaint  Patient presents with  . Atrial Fibrillation    Follow up visit - seen for Dr. Rayann Heman  . Congestive Heart Failure    History of Present Illness: [name] is a 74 y.o. male who presents today for a follow up vsiit. Seen for Dr. Rayann Heman. He has a history of diastolic HF, morbid obesity, PAF with past cardioversion, venous insufficiency, CKD, anemia, hypothyroidism, DM, COPD, OSA on CPAP, HLD, SSS with PPM in place, CAD (no specifics noted), HTN and depression. On chronic anticoagulation with Eliquis.   He has had several cardioversions - last just last month in July. He is now on amiodarone. Dr. Rayann Heman and I both have felt like he is not really any more symptomatic from his baseline regardless of his rhythm but [name] has preferred to be in NSR.  Our plan is that If he remains in sinus, will continue on amiodarone 200mg  daily. IF he does not convert or does not have significant clinical improve with cardioversion, it may be better to stop amiodarone and use a rate control strategy long term.  I saw him back in August and he was doing ok. Still pretty hard to say if how he feels is in any way related to what his rhythm is - but he was in NSR.   Comes back today. Here alone. Doing ok. Easily fatigued. Says his dyspnea is unchanged. Swelling unchanged. No chest pain. His weight is up but he says his swelling is no worse. Hard to say about how much he restricts his salt. More issues with what sounds like neuropathy. No falls. Bruising easily. No bleeding noted. Needs labs today - overall seems to be holding his own.   Past Medical History  Diagnosis Date  . COPD (chronic obstructive pulmonary disease) (HCC)     CPAP  . Renal insufficiency   . Hyperlipemia   . Hypothyroidism   . Hyperkalemia   .  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 Sick sinus syndrome ([place])     s/p PPM by JA  . Pancreatitis   . Morbid obesity ([place])   . OSA on CPAP     using CPAP although it is hard for him to tolerate  . CAD (coronary artery disease)   . Hypertensive cardiovascular-renal disease   . Depression   . Anxiety   . H/O Legionnaire's disease 2003  . History of blood clots     R groin  . Peripheral neuropathy ([place])   . Osteoarthritis     fingers  . GERD (gastroesophageal reflux disease)   . Anemia     hx of  . Pneumonia 2003  . Cancer [facility]) 2014    bladder cancer AND RIGHT URETERAL CANCER  . Diastolic dysfunction with chronic heart failure ([place])   . Persistent atrial fibrillation ([place])   . Atypical atrial flutter ([place])   . Venous insufficiency   . Complete heart block (HCC)     cardioverted from A.Fib,hx. Sick sinus syndrome -Pacemaker implanted  . Pacemaker     implanted [employer] 7'11  . CHF (congestive heart failure) ([place])   . Gait difficulty     slow gait"ambulates with walker"  . Shortness of breath dyspnea  with exertion  . Diabetes mellitus     TypeII  . History of kidney stones     Past Surgical History  Procedure Laterality Date  . Nasal septum surgery  1967  . Pacemaker placement  06/2010    [facility] Accent RF DR, Model [id number] [id number] ( Serial number [id number])  . Thromboembolectomy and four compartment fasciotomy Right 2009    leg  . Cholecystectomy  2012  . Insert / replace / remove pacemaker  2011  . Transurethral resection of bladder tumor with gyrus (turbt-gyrus) N/A 10/26/2013    Procedure: TRANSURETHRAL RESECTION OF BLADDER TUMOR WITH GYRUS (TURBT-GYRUS);  Surgeon: Alexis Frock, MD;  Location: [facility];  Service: Urology;  Laterality: N/A;  . Cystoscopy w/ ureteral stent placement Right 10/26/2013    Procedure: CYSTOSCOPY WITH RETROGRADE PYELOGRAM/URETERAL STENT PLACEMENT;  Surgeon: Alexis Frock, MD;  Location: [facility];  Service: Urology;   Laterality: Right;  . Embolectomy Left 11/02/2013    Procedure: LEFT FEMORAL EMBOLECTOMY, LEFT FEMORAL ARTERY ENDARTERECTOMY WITH DACRON PATCH ANGIOPLASTY.;  Surgeon: Mal Misty, MD;  Location: [place];  Service: Vascular;  Laterality: Left;  . Transurethral resection of bladder tumor with gyrus (turbt-gyrus) N/A 01/11/2014    Procedure: TRANSURETHRAL RESECTION OF BLADDER TUMOR WITH GYRUS (TURBT-GYRUS);  Surgeon: Alexis Frock, MD;  [facility];  Service: Urology;  Laterality: N/A;  . Cystoscopy with ureteroscopy and stent placement Right 01/11/2014    Procedure: CYSTOSCOPY WITH URETEROSCOPY ,RIGHT RETROGRADE AND STENT CHANGE AND LASER OF URETERAL TUMOR;  Surgeon: Alexis Frock, MD;  Location: [facility];  Service: Urology;  Laterality: Right;  . Holmium laser application Right [id number]    Procedure: HOLMIUM LASER APPLICATION;  Surgeon: Alexis Frock, MD;  [facility]: [facility];  Service: Urology;  Laterality: Right;  . Tee without cardioversion N/A 05/16/2014    Procedure: TRANSESOPHAGEAL ECHOCARDIOGRAM (TEE);  Surgeon: Josue Hector, MD;  Location: [facility] ENDOSCOPY;  Service: Cardiovascular;  Laterality: N/A;  . Cardioversion N/A 05/16/2014    Procedure: CARDIOVERSION;  Surgeon: Josue Hector, MD;  Location: [facility] ENDOSCOPY;  Service: Cardiovascular;  Laterality: N/A;  . Cardiac catheterization      11-02-13  . Cystoscopy/retrograde/ureteroscopy Right 08/23/2014    Procedure: CYSTOSCOPY/RETROGRADE/ DIAGNOSTIC URETEROSCOPY/RIGHT RENAL STONE EXTRACTION;  Surgeon: Alexis Frock, MD;  [facility];  Service: Urology;  Laterality: Right;  . Cardioversion N/A 11/08/2014    Procedure: CARDIOVERSION;  Surgeon: Fay Records, MD;  Location: [place];  Service: Cardiovascular;  Laterality: N/A;  . Eye surgery Left     cataract  . Belpharoptosis repair Right     Glaucoma  . Wisdom tooth extraction    . Groin debridement Right 05/08/2015    Procedure: REMOVAL OF RIGHT GROIN MASS;  Surgeon: Angelia Mould, MD;  Location: [place];  Service: Vascular;  Laterality: Right;  . Cardioversion N/A 06/22/2015    Procedure: CARDIOVERSION;  Surgeon: Jerline Pain, MD;  Location: [facility] ENDOSCOPY;  Service: Cardiovascular;  Laterality: N/A;     Medications: Current Outpatient Prescriptions  Medication Sig Dispense Refill  . ACCU-CHEK AVIVA PLUS test strip USE 3 TIMES PER DAY.  300 each 2  . acetaminophen (TYLENOL) 500 MG tablet Take 500-1,000 mg by mouth 2 (two) times daily as needed for moderate pain.     Marland Kitchen albuterol (PROVENTIL HFA;VENTOLIN HFA) 108 (90 BASE) MCG/ACT inhaler Inhale 2 puffs into the lungs every 6 (six) hours as needed for wheezing or shortness of breath. 1 Inhaler 6  . ALPRAZolam Duanne Moron)  0.25 MG tablet TAKE 1 TABLET BY MOUTH 3 TIMES A DAY AS NEEDED FOR ANXIETY OR SLEEP 50 tablet 2  . amiodarone (PACERONE) 200 MG tablet Take 1 tablet (200 mg total) by mouth daily. 60 tablet 6  . apixaban (ELIQUIS) 5 MG TABS tablet Take 1 tablet (5 mg total) by mouth 2 (two) times daily. 180 tablet 3  . Ascorbic Acid (VITAMIN C) 500 MG tablet Take 500 mg by mouth daily.     . brimonidine (ALPHAGAN P) 0.1 % SOLN Place 1 drop into the left eye 3 (three) times daily.     . calcium carbonate (TUMS - DOSED IN MG ELEMENTAL CALCIUM) 500 MG chewable tablet Chew 2 tablets by mouth 2 (two) times daily as needed for indigestion or heartburn.    . cholecalciferol (VITAMIN D) 1000 UNITS tablet Take 1,000 Units by mouth daily.    Marland Kitchen dextromethorphan (DELSYM) 30 MG/5ML liquid Take 90 mg by mouth 2 (two) times daily as needed for cough.    . docusate sodium (COLACE) 100 MG capsule Take 100 mg by mouth daily as needed for mild constipation.    . dorzolamide-timolol (COSOPT) 22.3-6.8 MG/ML ophthalmic solution Place 1 drop into both eyes 2 (two) times daily.     . fluticasone (FLONASE) 50 MCG/ACT nasal spray Place 2 sprays into both nostrils daily as needed for allergies or rhinitis.    . furosemide (LASIX) 40 MG tablet Take  40 mg by mouth every morning. Take 20 mg by mouth every evening    . guaifenesin (HUMIBID E) 400 MG TABS tablet Take 400 mg by mouth 2 (two) times daily as needed (for cough or congestion).    . insulin regular human CONCENTRATED (HUMULIN R) 500 UNIT/ML injection 3 times a day (just before each meal) 300-250-250, and 100 units with your bedtime snack 180 mL 3  . Insulin Syringe-Needle U-100 30G X 5/16" 1 ML MISC Use to inject insulin. 500 each 2  . Lactobacillus (ACIDOPHILUS) CAPS capsule Take 1 capsule by mouth daily.    Marland Kitchen latanoprost (XALATAN) 0.005 % ophthalmic solution Place 1 drop into the left eye at bedtime.     Marland Kitchen levothyroxine (SYNTHROID, LEVOTHROID) 50 MCG tablet Take 50 mcg by mouth daily before breakfast.    . metoprolol tartrate (LOPRESSOR) 25 MG tablet Take 1 tablet (25 mg total) by mouth 3 (three) times daily. 270 tablet 3  . Multiple Vitamin (MULTIVITAMIN WITH MINERALS) TABS tablet Take 1 tablet by mouth daily.    . Omega-3 Fatty Acids (FISH OIL) 1000 MG CAPS Take 1,000 mg by mouth every morning.     Marland Kitchen omeprazole (PRILOSEC) 40 MG capsule TAKE 1 CAPSULE EVERY DAY 90 capsule 1  . rosuvastatin (CRESTOR) 40 MG tablet TAKE 1/2 TABLET EVERY EVENING 45 tablet 2  . traMADol (ULTRAM) 50 MG tablet Take 100 mg by mouth every 12 (twelve) hours as needed for moderate pain.     No current facility-administered medications for this visit.    Allergies: Allergies  Allergen Reactions  . Actos [Pioglitazone] Swelling  . Timolol Other (See Comments)    Slow heart rate but tolerates Cosopt (dorzolamide-timolol)    Social History: The patient  reports that he quit smoking about 8 years ago. His smoking use included Cigarettes. He has a 100 pack-year smoking history. He has never used smokeless tobacco. He reports that he does not drink alcohol or use illicit drugs.   Family History: The patient's family history includes Cancer in his mother; Heart  attack in his father; Liver cancer in his  mother. There is no history of Colon cancer.   Review of Systems: Please see the history of present illness.   Otherwise, the review of systems is positive for leg swelling, DOE, cough, back pain, easy bruising, fatigue, snoring and balance issues.   All other systems are reviewed and negative.   Physical Exam: VS:  BP 108/48 mmHg  Pulse 74  Ht 5\' 10"  (1.778 m)  Wt 319 lb (144.697 kg)  BMI 45.77 kg/m2  SpO2 97% .  BMI Body mass index is 45.77 kg/(m^2).  Wt Readings from Last 3 Encounters:  11/16/15 319 lb (144.697 kg)  10/23/15 312 lb (141.522 kg)  08/17/15 312 lb (141.522 kg)    General: Pleasant. He is chronically ill appearing butin no acute distress. His weight is up about 7 pounds.  HEENT: Normal. Neck: Supple, no JVD, carotid bruits, or masses noted.  Cardiac: Regular rate and rhythm. Soft outflow murmur noted. Heart tones are distant. Legs remain very full with edema.  Respiratory:  Lungs are clear to auscultation bilaterally with normal work of breathing.  GI: Soft and nontender.  MS: No deformity or atrophy. Gait and ROM intact. Skin: Warm and dry. Color is normal.  Neuro:  Strength and sensation are intact and no gross focal deficits noted.  Psych: Alert, appropriate and with normal affect.   LABORATORY DATA:  EKG:  EKG is ordered today. This demonstrates a paced rhythm today.  Lab Results  Component Value Date   WBC 10.9* 06/18/2015   HGB 13.6 06/18/2015   HCT 41.4 06/18/2015   PLT 247.0 06/18/2015   GLUCOSE 372* 06/18/2015   CHOL 123 08/09/2014   TRIG * 08/09/2014    470.0 Triglyceride is over 400; calculations on Lipids are invalid.   HDL 25.80* 08/09/2014   LDLDIRECT 58.9 08/09/2014   LDLCALC  05/07/2008    UNABLE TO CALCULATE IF TRIGLYCERIDE OVER 400 mg/dL        Total Cholesterol/HDL:CHD Risk Coronary Heart Disease Risk Table                     Men   Women  1/2 Average Risk   3.4   3.3   ALT 27 05/29/2015   AST 29 05/29/2015   NA 135 06/18/2015     K 4.4 06/18/2015   CL 97 06/18/2015   CREATININE 1.54* 06/18/2015   BUN 33* 06/18/2015   CO2 26 06/18/2015   TSH 3.48 05/29/2015   PSA 0.61 08/09/2014   INR 1.2* 05/29/2015   HGBA1C 8.1 08/17/2015   MICROALBUR 170.0 Repeated and verified X2.* 06/27/2013    BNP (last 3 results) No results for input(s): BNP in the last 8760 hours.  ProBNP (last 3 results) No results for input(s): PROBNP in the last 8760 hours.   Other Studies Reviewed Today:  Electrical Cardioversion Procedure Note  IMPRESSION:  Successful cardioversion of atrial fibrillation Pacemaker interrogated Aaron Edelman)  SKAINS, [place] 06/22/2015, 3:52 PM   Echo Study Conclusions from June 2015  - Left ventricle: Wall thickness was increased in a pattern of severe LVH. The estimated ejection fraction was 55%. - Aortic valve: There was very mild stenosis. - Mitral valve: Mildly calcified annulus. There was mild regurgitation. - Left atrium: The atrium was dilated. No evidence of thrombus in the atrial cavity or appendage. - Right atrium: No evidence of thrombus in the atrial cavity or appendage. - Atrial septum: No defect or patent foramen ovale  was identified. Echo contrast study showed no right-to-left atrial level shunt, following an increase in RA pressure induced by provocative maneuvers. - Tricuspid valve: No evidence of vegetation. - Pulmonic valve: No evidence of vegetation. - Impressions: No LAA thrombus Proceeded with succesful [place] under propofol anesthesia.  Impressions:  - No LAA thrombus Proceeded with succesful [facility] under propofol anesthesia.  Assessment/Plan: 1. Paroxysmal afib - he is now on amiodarone and has been cardioverted again back in July - EKG today shows paced rhythm.   2. Chronic diastolic dysfunction The importance of sodium restriction and daily weight is advised. He seems to be at his baseline from a symptom standpoint but weight is up - check BNP - for now I have left him on  his current regimen.   4. Sick sinus syndrome/ complete heart block Followed by Dr. Rayann Heman - sees him in March.   5. Chronic venous stasis changes His BLE edema is primarily due to obesity. Weight loss is encouraged. Support hose are also recommended but I don't this is possible.  6. Morbid obesity Body mass index is 45.08 kg/(m^2). He has been referred in the past to nutrition and rehab. Unfortunately, I do not see where he will be able to make real substantial changes.   7. OSA Compliance with CPAP is encouraged  8. Hypertensive cardio-renal disease  His overall prognosis is quite poor but he seems to be holding on. Recheck his labs today. I will see back as needed.   Current medicines are reviewed with the patient today.  The patient does not have concerns regarding medicines other than what has been noted above.  The following changes have been made:  See above.  Labs/ tests ordered today include:    Orders Placed This Encounter  Procedures  . Basic metabolic panel  . CBC  . Hepatic function panel  . Brain natriuretic peptide  . TSH  . EKG 12-Lead     Disposition:   FU with Dr. Rayann Heman in March as planned.    Patient is agreeable to this plan and will call if any problems develop in the interim.   Signed: Burtis Junes, RN, ANP-C 11/16/2015 10:56 AM  [place] [address] [place], [place]  [address] Phone: [phone] Fax: [phone]

## 2015-11-16 NOTE — Patient Instructions (Signed)
Please change the insulin to 3 times a day (just before each meal) 325-250-225, and 100 units with your bedtime snack. Please come back for a regular physical appointment in 3 months (must be after 02/20/16).   check your blood sugar twice a day.  vary the time of day when you check, between before the 3 meals, and at bedtime.  also check if you have symptoms of your blood sugar being too high or too low.  please keep a record of the readings and bring it to your next appointment here.  You can write it on any piece of paper.  please call us sooner if your blood sugar goes below 70, or if you have a lot of readings over 200.   Please call if you want to be tested for diabetic neuropathy.

## 2015-11-16 NOTE — Patient Instructions (Addendum)
We will be checking the following labs today - BMET, BNP, CBC, HPF and TSH   Medication Instructions:    Continue with your current medicines.   I refilled your Eliquis    Testing/Procedures To Be Arranged:  N/A  Follow-Up:   See Dr. Rayann Heman in March.     Other Special Instructions:   N/A    If you need a refill on your cardiac medications before your next appointment, please call your pharmacy.   Call the [place] office at [phone] if you have any questions, problems or concerns.

## 2015-11-16 NOTE — Progress Notes (Signed)
Subjective:    Patient ID: [name], male    DOB: 04-May-1941, 74 y.o.   MRN: [id number]  HPI Pt returns for f/u of diabetes mellitus: DM type: Insulin-requiring type 2 Dx'ed: [id number] Complications: renal insufficiency, polyneuropathy, and CAD.   Therapy: insulin since 1989.  DKA: never Severe hypoglycemia: never.   Pancreatitis: several episodes, most recently in 2013. Other: he has severe insulin resistance; he takes multiple daily injections of U-500; variable cbg's have limited efforts at glycemic control.   Interval history: he brings a record of his cbg's which I have reviewed today.  It varies from widely, but most are in the 100's.  It is in general highest at lunch, and lowest at hs.  pt states he feels well in general.   He has mild hypoglycemia every few days. He has 1 month of moderate numbness of the right wrist and hand, and assoc pain.  sxs are worst at night.  Wearing a glove helps.   Past Medical History  Diagnosis Date  . COPD (chronic obstructive pulmonary disease) (HCC)     CPAP  . Renal insufficiency   . Hyperlipemia   . Hypothyroidism   . Hyperkalemia   . 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 Sick sinus syndrome ([place])     s/p PPM by JA  . Pancreatitis   . Morbid obesity ([place])   . OSA on CPAP     using CPAP although it is hard for him to tolerate  . CAD (coronary artery disease)   . Hypertensive cardiovascular-renal disease   . Depression   . Anxiety   . H/O Legionnaire's disease 2003  . History of blood clots     R groin  . Peripheral neuropathy ([place])   . Osteoarthritis     fingers  . GERD (gastroesophageal reflux disease)   . Anemia     hx of  . Pneumonia 2003  . Cancer [facility]) 2014    bladder cancer AND RIGHT URETERAL CANCER  . Diastolic dysfunction with chronic heart failure ([place])   . Persistent atrial fibrillation ([place])   . Atypical atrial flutter ([place])   . Venous insufficiency   . Complete heart block (HCC)     cardioverted  from A.Fib,hx. Sick sinus syndrome -Pacemaker implanted  . Pacemaker     implanted St. Jude 7'11  . CHF (congestive heart failure) ([place])   . Gait difficulty     slow gait"ambulates with walker"  . Shortness of breath dyspnea     with exertion  . Diabetes mellitus     TypeII  . History of kidney stones     Past Surgical History  Procedure Laterality Date  . Nasal septum surgery  1967  . Pacemaker placement  06/2010    [facility] Accent RF DR, Model [id number] ( Serial number [id number])  . Thromboembolectomy and four compartment fasciotomy Right 2009    leg  . Cholecystectomy  2012  . Insert / replace / remove pacemaker  2011  . Transurethral resection of bladder tumor with gyrus (turbt-gyrus) N/A 10/26/2013    Procedure: TRANSURETHRAL RESECTION OF BLADDER TUMOR WITH GYRUS (TURBT-GYRUS);  Surgeon: Alexis Frock, MD;  Location: [facility];  Service: Urology;  Laterality: N/A;  . Cystoscopy w/ ureteral stent placement Right 10/26/2013    Procedure: CYSTOSCOPY WITH RETROGRADE PYELOGRAM/URETERAL STENT PLACEMENT;  Surgeon: Alexis Frock, MD;  [facility];  Service: Urology;  Laterality: Right;  .  Embolectomy Left 11/02/2013    Procedure: LEFT FEMORAL EMBOLECTOMY, LEFT FEMORAL ARTERY ENDARTERECTOMY WITH DACRON PATCH ANGIOPLASTY.;  Surgeon: Mal Misty, MD;  Location: [place];  Service: Vascular;  Laterality: Left;  . Transurethral resection of bladder tumor with gyrus (turbt-gyrus) N/A 01/11/2014    Procedure: TRANSURETHRAL RESECTION OF BLADDER TUMOR WITH GYRUS (TURBT-GYRUS);  Surgeon: Alexis Frock, MD;  [facility];  Service: Urology;  Laterality: N/A;  . Cystoscopy with ureteroscopy and stent placement Right 01/11/2014    Procedure: CYSTOSCOPY WITH URETEROSCOPY ,RIGHT RETROGRADE AND STENT CHANGE AND LASER OF URETERAL TUMOR;  Surgeon: Alexis Frock, MD;  [facility];  Service: Urology;  Laterality: Right;  . Holmium laser application Right [id number]    Procedure: HOLMIUM LASER  APPLICATION;  Surgeon: Alexis Frock, MD;  [facility];  Service: Urology;  Laterality: Right;  . Tee without cardioversion N/A 05/16/2014    Procedure: TRANSESOPHAGEAL ECHOCARDIOGRAM (TEE);  Surgeon: Josue Hector, MD;  Location: [facility] ENDOSCOPY;  Service: Cardiovascular;  Laterality: N/A;  . Cardioversion N/A 05/16/2014    Procedure: CARDIOVERSION;  Surgeon: Josue Hector, MD;  Location: [facility] ENDOSCOPY;  Service: Cardiovascular;  Laterality: N/A;  . Cardiac catheterization      11-02-13  . Cystoscopy/retrograde/ureteroscopy Right 08/23/2014    Procedure: CYSTOSCOPY/RETROGRADE/ DIAGNOSTIC URETEROSCOPY/RIGHT RENAL STONE EXTRACTION;  Surgeon: Alexis Frock, MD;  [facility];  Service: Urology;  Laterality: Right;  . Cardioversion N/A 11/08/2014    Procedure: CARDIOVERSION;  Surgeon: Fay Records, MD;  Location: [place] [facility];  Service: Cardiovascular;  Laterality: N/A;  . Eye surgery Left     cataract  . Belpharoptosis repair Right     Glaucoma  . Wisdom tooth extraction    . Groin debridement Right 05/08/2015    Procedure: REMOVAL OF RIGHT GROIN MASS;  Surgeon: Angelia Mould, MD;  Location: [facility] OR;  Service: Vascular;  Laterality: Right;  . Cardioversion N/A 06/22/2015    Procedure: CARDIOVERSION;  Surgeon: Jerline Pain, MD;  Location: [facility] ENDOSCOPY;  Service: Cardiovascular;  Laterality: N/A;    Social History   Social History  . Marital Status: Married    Spouse Name: N/A  . Number of Children: N/A  . 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 Smoking status: Former Smoker -- 2.00 packs/day for 50 years    Types: Cigarettes    Quit date: 12/08/2006  . Smokeless tobacco: Never Used  . Alcohol Use: No     Comment: quit 3 years ago  . Drug Use: No  . Sexual Activity: Not Currently   Other Topics Concern  . Not on file   Social History Narrative    Current Outpatient Prescriptions on File Prior  to Visit  Medication Sig Dispense Refill  . ACCU-CHEK AVIVA PLUS test strip USE 3 TIMES PER DAY.  300 each 2  . acetaminophen (TYLENOL) 500 MG tablet Take 500-1,000 mg by mouth 2 (two) times daily as needed for moderate pain.     Marland Kitchen albuterol (PROVENTIL HFA;VENTOLIN HFA) 108 (90 BASE) MCG/ACT inhaler Inhale 2 puffs into the lungs every 6 (six) hours as needed for wheezing or shortness of breath. 1 Inhaler 6  . ALPRAZolam (XANAX) 0.25 MG tablet TAKE 1 TABLET BY MOUTH 3 TIMES A DAY AS NEEDED FOR ANXIETY OR SLEEP 50 tablet 2  . amiodarone (PACERONE) 200 MG tablet Take 1 tablet (200 mg total) by  mouth daily. 60 tablet 6  . Ascorbic Acid (VITAMIN C) 500 MG tablet Take 500 mg by mouth daily.     . brimonidine (ALPHAGAN P) 0.1 % SOLN Place 1 drop into the left eye 3 (three) times daily.     . calcium carbonate (TUMS - DOSED IN MG ELEMENTAL CALCIUM) 500 MG chewable tablet Chew 2 tablets by mouth 2 (two) times daily as needed for indigestion or heartburn.    . cholecalciferol (VITAMIN D) 1000 UNITS tablet Take 1,000 Units by mouth daily.    Marland Kitchen dextromethorphan (DELSYM) 30 MG/5ML liquid Take 90 mg by mouth 2 (two) times daily as needed for cough.    . docusate sodium (COLACE) 100 MG capsule Take 100 mg by mouth daily as needed for mild constipation.    . dorzolamide-timolol (COSOPT) 22.3-6.8 MG/ML ophthalmic solution Place 1 drop into both eyes 2 (two) times daily.     . fluticasone (FLONASE) 50 MCG/ACT nasal spray Place 2 sprays into both nostrils daily as needed for allergies or rhinitis.    Marland Kitchen guaifenesin (HUMIBID E) 400 MG TABS tablet Take 400 mg by mouth 2 (two) times daily as needed (for cough or congestion).    . Insulin Syringe-Needle U-100 30G X 5/16" 1 ML MISC Use to inject insulin. 500 each 2  . Lactobacillus (ACIDOPHILUS) CAPS capsule Take 1 capsule by mouth daily.    Marland Kitchen latanoprost (XALATAN) 0.005 % ophthalmic solution Place 1 drop into the left eye at bedtime.     . metoprolol tartrate (LOPRESSOR)  25 MG tablet Take 1 tablet (25 mg total) by mouth 3 (three) times daily. 270 tablet 3  . Multiple Vitamin (MULTIVITAMIN WITH MINERALS) TABS tablet Take 1 tablet by mouth daily.    . Omega-3 Fatty Acids (FISH OIL) 1000 MG CAPS Take 1,000 mg by mouth every morning.      No current facility-administered medications on file prior to visit.    Allergies  Allergen Reactions  . Actos [Pioglitazone] Swelling  . Timolol Other (See Comments)    Slow heart rate but tolerates Cosopt (dorzolamide-timolol)    Family History  Problem Relation Age of Onset  . Liver cancer Mother     deceased age 39  . Cancer Mother     liver cancer  . Colon cancer Neg Hx   . Heart attack Father     BP 132/62 mmHg  Pulse 82  Temp(Src) 97.6 F (36.4 C) (Oral)  Ht 5\' 10"  (1.778 m)  Wt 318 lb (144.244 kg)  BMI 45.63 kg/m2  SpO2 96%    Review of Systems He denies hypoglycemia and rash.    Objective:   Physical Exam VITAL SIGNS:  See vs page GENERAL: no distress CV: dorsalis pedis pulses intact bilat.  EXTEMITIES: no deformity. no ulcer on the feet. feet are of normal color and temp. 1+ bilat leg edema. Several healed surgical scars at the right leg. There is bilateral onychomycosis. There is very dry skin andhyperpigmentation of the legs.  Neuro: sensation is intact to touch on the feet, but decreased from normal. Pulses: dorsalis radials are intact bilat.   MSK: no deformity of the UE's CV: no swelling of the wrists or hands Skin: normal color and temp on the feet. Neuro: sensation is intact to touch on the hands   A1c=7.0%    Assessment & Plan:  DM: The pattern of his cbg's indicates he needs some adjustment in his therapy UE pain, new, uncertain etiology.  Patient is advised  the following: Patient Instructions  Please change the insulin to 3 times a day (just before each meal) 325-250-225, and 100 units with your bedtime snack. Please come back for a regular physical appointment in 3  months (must be after 02/20/16).   check your blood sugar twice a day.  vary the time of day when you check, between before the 3 meals, and at bedtime.  also check if you have symptoms of your blood sugar being too high or too low.  please keep a record of the readings and bring it to your next appointment here.  You can write it on any piece of paper.  please call us sooner if your blood sugar goes below 70, or if you have a lot of readings over 200.   Please call if you want to be tested for diabetic neuropathy.

## 2015-11-21 ENCOUNTER — Ambulatory Visit (INDEPENDENT_AMBULATORY_CARE_PROVIDER_SITE_OTHER): Payer: Commercial Managed Care - HMO | Admitting: Adult Health

## 2015-11-21 ENCOUNTER — Encounter: Payer: Self-pay | Admitting: Adult Health

## 2015-11-21 ENCOUNTER — Ambulatory Visit: Payer: Commercial Managed Care - HMO | Admitting: Pulmonary Disease

## 2015-11-21 VITALS — BP 112/70 | HR 70 | Temp 97.9°F | Ht 70.0 in | Wt 314.0 lb

## 2015-11-21 DIAGNOSIS — G4733 Obstructive sleep apnea (adult) (pediatric): Secondary | ICD-10-CM

## 2015-11-21 NOTE — Addendum Note (Signed)
Addended by: Osa Craver on: 11/21/2015 11:00 AM   Modules accepted: Orders

## 2015-11-21 NOTE — Assessment & Plan Note (Addendum)
Controlled on CPAP   Plan  Continue on CPAP At bedtime   Goal is to wear for at least 6 hr each night  Work on weight loss.  Do not drive if sleepy follow up Dr. Elsworth Soho  In 1 year and As needed

## 2015-11-21 NOTE — Assessment & Plan Note (Signed)
Work on weight loss.

## 2015-11-21 NOTE — Patient Instructions (Signed)
Continue on CPAP At bedtime   Goal is to wear for at least 6 hr each night  Work on weight loss.  Do not drive if sleepy follow up Dr. Elsworth Soho  In 1 year and As needed

## 2015-11-21 NOTE — Progress Notes (Signed)
Subjective:    Patient ID: [name], male    DOB: Dec 19, 1940, 74 y.o.   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   TEST  NPSG 2003:  AHI 81/hr.   11/21/2015 Follow up : OSA Pt returns for yearly follow up for severe sleep apnea.   Uses CPAP 4-6 hours on average every night. Mask fits fine.   Feels rested with no sign daytime sleepiness.  Unable to get download CPAP chip . Download requested from DME.  Denies chest pain ,  N/v/d. Or fever.    Past Medical History  Diagnosis Date  . COPD (chronic obstructive pulmonary disease) (HCC)     CPAP  . Renal insufficiency   . Hyperlipemia   . Hypothyroidism   . Hyperkalemia   . 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 Sick sinus syndrome ([place])     s/p PPM by JA  . Pancreatitis   . Morbid obesity ([place])   . OSA on CPAP     using CPAP although it is hard for him to tolerate  . CAD (coronary artery disease)   . Hypertensive cardiovascular-renal disease   . Depression   . Anxiety   . H/O Legionnaire's disease 2003  . History of blood clots     R groin  . Peripheral neuropathy ([place])   . Osteoarthritis     fingers  . GERD (gastroesophageal reflux disease)   . Anemia     hx of  . Pneumonia 2003  . Cancer [facility]) 2014    bladder cancer AND RIGHT URETERAL CANCER  . Diastolic dysfunction with chronic heart failure ([place])   . Persistent atrial fibrillation ([place])   . Atypical atrial flutter ([place])   . Venous insufficiency   . Complete heart block (HCC)     cardioverted from A.Fib,hx. Sick sinus syndrome -Pacemaker implanted  . Pacemaker     implanted St. Jude 7'11  . CHF (congestive heart failure) ([place])   . Gait difficulty     slow gait"ambulates with walker"  . Shortness of breath dyspnea     with exertion  . Diabetes mellitus     TypeII  . History of kidney stones    Current Outpatient Prescriptions on File Prior to Visit  Medication Sig Dispense Refill  .  ACCU-CHEK AVIVA PLUS test strip USE 3 TIMES PER DAY.  300 each 2  . acetaminophen (TYLENOL) 500 MG tablet Take 500-1,000 mg by mouth 2 (two) times daily as needed for moderate pain.     Marland Kitchen albuterol (PROVENTIL HFA;VENTOLIN HFA) 108 (90 BASE) MCG/ACT inhaler Inhale 2 puffs into the lungs every 6 (six) hours as needed for wheezing or shortness of breath. 1 Inhaler 6  . ALPRAZolam (XANAX) 0.25 MG tablet TAKE 1 TABLET BY MOUTH 3 TIMES A DAY AS NEEDED FOR ANXIETY OR SLEEP 50 tablet 2  . amiodarone (PACERONE) 200 MG tablet Take 1 tablet (200 mg total) by mouth daily. 60 tablet 6  . apixaban (ELIQUIS) 5 MG TABS tablet Take 1 tablet (5 mg total) by mouth 2 (two) times daily. 180 tablet 3  . Ascorbic Acid (VITAMIN C) 500 MG tablet Take 500 mg by mouth daily.     . brimonidine (ALPHAGAN P) 0.1 % SOLN Place 1 drop into the left eye 3 (three) times daily.     . calcium carbonate (TUMS - DOSED IN MG  ELEMENTAL CALCIUM) 500 MG chewable tablet Chew 2 tablets by mouth 2 (two) times daily as needed for indigestion or heartburn.    . cholecalciferol (VITAMIN D) 1000 UNITS tablet Take 1,000 Units by mouth daily.    Marland Kitchen dextromethorphan (DELSYM) 30 MG/5ML liquid Take 90 mg by mouth 2 (two) times daily as needed for cough.    . docusate sodium (COLACE) 100 MG capsule Take 100 mg by mouth daily as needed for mild constipation.    . dorzolamide-timolol (COSOPT) 22.3-6.8 MG/ML ophthalmic solution Place 1 drop into both eyes 2 (two) times daily.     . fluticasone (FLONASE) 50 MCG/ACT nasal spray Place 2 sprays into both nostrils daily as needed for allergies or rhinitis.    . furosemide (LASIX) 40 MG tablet Take 40 mg by mouth every morning. Take 20 mg by mouth every evening    . guaifenesin (HUMIBID E) 400 MG TABS tablet Take 400 mg by mouth 2 (two) times daily as needed (for cough or congestion).    . insulin regular human CONCENTRATED (HUMULIN R) 500 UNIT/ML injection 3 times a day (just before each meal) 325-250-225, and 100  units with your bedtime snack 180 mL 3  . Insulin Syringe-Needle U-100 30G X 5/16" 1 ML MISC Use to inject insulin. 500 each 2  . Lactobacillus (ACIDOPHILUS) CAPS capsule Take 1 capsule by mouth daily.    Marland Kitchen latanoprost (XALATAN) 0.005 % ophthalmic solution Place 1 drop into the left eye at bedtime.     Marland Kitchen levothyroxine (SYNTHROID, LEVOTHROID) 50 MCG tablet Take 50 mcg by mouth daily before breakfast.    . metoprolol tartrate (LOPRESSOR) 25 MG tablet Take 1 tablet (25 mg total) by mouth 3 (three) times daily. 270 tablet 3  . Multiple Vitamin (MULTIVITAMIN WITH MINERALS) TABS tablet Take 1 tablet by mouth daily.    . Omega-3 Fatty Acids (FISH OIL) 1000 MG CAPS Take 1,000 mg by mouth every morning.     Marland Kitchen omeprazole (PRILOSEC) 40 MG capsule Take 40 mg by mouth daily.    . rosuvastatin (CRESTOR) 40 MG tablet Take 20 mg by mouth daily.    . traMADol (ULTRAM) 50 MG tablet Take 100 mg by mouth every 12 (twelve) hours as needed for moderate pain.     No current facility-administered medications on file prior to visit.     Review of Systems  Constitutional:   No  weight loss, night sweats,  Fevers, chills, + fatigue, or  lassitude.  HEENT:   No headaches,  Difficulty swallowing,  Tooth/dental problems, or  Sore throat,                No sneezing, itching, ear ache,  +nasal congestion, post nasal drip,   CV:  No chest pain,  Orthopnea, PND,   anasarca, dizziness, palpitations, syncope.   GI  No heartburn, indigestion, abdominal pain, nausea, vomiting, diarrhea, change in bowel habits, loss of appetite, bloody stools.   Resp: No shortness of breath with exertion or at rest.  No excess mucus, no productive cough,  No non-productive cough,  No coughing up of blood.  No change in color of mucus.  No wheezing.  No chest wall deformity  Skin: no rash or lesions.  GU: no dysuria, change in color of urine, no urgency or frequency.  No flank pain, no hematuria   MS:  No joint pain or swelling.  No  decreased range of motion.  No back pain.  Psych:  No change in mood  or affect. No depression or anxiety.  No memory loss.         Objective:   Physical Exam  GEN: A/Ox3; pleasant , NAD, morbidly obese   HEENT:  [place]/AT,  EACs-clear, TMs-wnl, NOSE-clear, THROAT-clear, no lesions, no postnasal drip or exudate noted.   NECK:  Supple w/ fair ROM; no JVD; normal carotid impulses w/o bruits; no thyromegaly or nodules palpated; no lymphadenopathy.  RESP  Clear  P & A; w/o, wheezes/ rales/ or rhonchi.no accessory muscle use, no dullness to percussion  CARD:  RRR, no m/r/g  , tr-1+  peripheral edema, pulses intact, no cyanosis or clubbing.  GI:   Soft & nt; nml bowel sounds; no organomegaly or masses detected.  Musco: Warm bil, no deformities or joint swelling noted.   Neuro: alert, no focal deficits noted.    Skin: Warm, no lesions or rashes       Assessment & Plan:

## 2015-11-26 ENCOUNTER — Other Ambulatory Visit: Payer: Self-pay | Admitting: Nurse Practitioner

## 2015-11-29 ENCOUNTER — Other Ambulatory Visit: Payer: Self-pay | Admitting: *Deleted

## 2015-11-29 ENCOUNTER — Other Ambulatory Visit (INDEPENDENT_AMBULATORY_CARE_PROVIDER_SITE_OTHER): Payer: Commercial Managed Care - HMO | Admitting: *Deleted

## 2015-11-29 DIAGNOSIS — I509 Heart failure, unspecified: Secondary | ICD-10-CM | POA: Diagnosis not present

## 2015-11-29 DIAGNOSIS — I130 Hypertensive heart and chronic kidney disease with heart failure and stage 1 through stage 4 chronic kidney disease, or unspecified chronic kidney disease: Secondary | ICD-10-CM | POA: Diagnosis not present

## 2015-11-29 DIAGNOSIS — N183 Chronic kidney disease, stage 3 (moderate): Secondary | ICD-10-CM

## 2015-11-29 LAB — BASIC METABOLIC PANEL
BUN: 35 mg/dL — AB (ref 7–25)
CALCIUM: 8.9 mg/dL (ref 8.6–10.3)
CO2: 26 mmol/L (ref 20–31)
CREATININE: 1.76 mg/dL — AB (ref 0.70–1.18)
Chloride: 101 mmol/L (ref 98–110)
GLUCOSE: 158 mg/dL — AB (ref 65–99)
Potassium: 4.2 mmol/L (ref 3.5–5.3)
Sodium: 137 mmol/L (ref 135–146)

## 2015-11-29 NOTE — Addendum Note (Signed)
Addended by: Eulis Foster on: 11/29/2015 10:22 AM   Modules accepted: Orders

## 2015-12-06 ENCOUNTER — Other Ambulatory Visit: Payer: Self-pay | Admitting: *Deleted

## 2015-12-06 ENCOUNTER — Ambulatory Visit: Payer: Self-pay | Admitting: *Deleted

## 2015-12-06 NOTE — Patient Outreach (Signed)
[facility]) Care Management  12/06/2015  [name] 1941-04-28 [id number]   RN Health Coach telephone call to patient.  Hip[employer] compliance verified. Patient came to phone very short of breath with minimal exertion. RN health coach asked patient about salt intake. Per patient he does not put salt on foods but patient is eating  processed foods. Patient is concerned about the numbness and tingling in arm that the physician told him was neuropathy. Patient has concerns about his insulin coverage after 0101/2017. Patient stated his sleep apnea machine was about 72 years old and the physician could not read the chip. Per patient he was not feeling sleepy and declined looking at replacing the sleeping apnea machine with a newer model. Patient stated his blood sugar is 108 this morning and was 99 last night. Patient was able to described the signs and symptoms of hypoglycemia and stated he gets sweaty and dizzy. He takes additional carbohydrates such as grape juice and peanut butter and crackers. Patient has started doing home blood pressures with a wrist monitor. Patient agreed to follow up outreach calls.  ASSESSMENT: Patient blood sugars are looking better Patient has a knowledge deficit of low sodium foods Patient is having pain with the neuropathy in upper and lower extremities Patient does have an old sleep apnea machine but declined new one Patient would benefit from continued outreach calls and support for CHF  PLAN: RN Health Coach will send EMMI information on [place] and [place] will send EMMI information on Pneumonia [place] will send EMMI information on Diabetes and Blood Pressure RN Health Coach will send EMMI information on Diabetes when you are sick RN Health Coach will continue to encourage patient to document weight and blood sugars RN Health Coach sent educational material  on cooking with less salt [place] sent  education material on [place] eating [place] sent educational material on Low sodium eating plan  [place] Management [phone]

## 2015-12-11 NOTE — Progress Notes (Signed)
Reviewed & agree with plan  

## 2015-12-17 ENCOUNTER — Ambulatory Visit (INDEPENDENT_AMBULATORY_CARE_PROVIDER_SITE_OTHER): Payer: Commercial Managed Care - HMO | Admitting: *Deleted

## 2015-12-17 ENCOUNTER — Other Ambulatory Visit: Payer: Self-pay

## 2015-12-17 DIAGNOSIS — I495 Sick sinus syndrome: Secondary | ICD-10-CM | POA: Diagnosis not present

## 2015-12-18 NOTE — Progress Notes (Signed)
Remote pacemaker transmission.   

## 2015-12-25 DIAGNOSIS — C670 Malignant neoplasm of trigone of bladder: Secondary | ICD-10-CM | POA: Diagnosis not present

## 2015-12-25 DIAGNOSIS — N3020 Other chronic cystitis without hematuria: Secondary | ICD-10-CM | POA: Diagnosis not present

## 2015-12-25 DIAGNOSIS — C669 Malignant neoplasm of unspecified ureter: Secondary | ICD-10-CM | POA: Diagnosis not present

## 2015-12-25 DIAGNOSIS — R1909 Other intra-abdominal and pelvic swelling, mass and lump: Secondary | ICD-10-CM | POA: Diagnosis not present

## 2015-12-25 DIAGNOSIS — Z0000 Encounter for general adult medical examination without abnormal findings: Secondary | ICD-10-CM | POA: Diagnosis not present

## 2015-12-25 DIAGNOSIS — N200 Calculus of kidney: Secondary | ICD-10-CM | POA: Diagnosis not present

## 2015-12-26 DIAGNOSIS — H401123 Primary open-angle glaucoma, left eye, severe stage: Secondary | ICD-10-CM | POA: Diagnosis not present

## 2015-12-26 DIAGNOSIS — H401113 Primary open-angle glaucoma, right eye, severe stage: Secondary | ICD-10-CM | POA: Diagnosis not present

## 2016-01-05 ENCOUNTER — Other Ambulatory Visit: Payer: Self-pay | Admitting: Endocrinology

## 2016-01-06 LAB — CUP PACEART REMOTE DEVICE CHECK
Battery Remaining Longevity: 78 mo
Battery Voltage: 2.92 V
Brady Statistic RA Percent Paced: 99 %
Brady Statistic RV Percent Paced: 99 %
Date Time Interrogation Session: 20170109082242
Implantable Lead Implant Date: 20110705
Implantable Lead Location: 753859
Lead Channel Impedance Value: 460 Ohm
Lead Channel Pacing Threshold Amplitude: 0.875 V
Lead Channel Pacing Threshold Pulse Width: 0.5 ms
Lead Channel Setting Pacing Amplitude: 1.875
Lead Channel Setting Sensing Sensitivity: 4 mV
MDC IDC LEAD IMPLANT DT: 20110705
MDC IDC LEAD LOCATION: 753860
MDC IDC MSMT BATTERY REMAINING PERCENTAGE: 73 %
MDC IDC MSMT LEADCHNL RA SENSING INTR AMPL: 4.4 mV
MDC IDC MSMT LEADCHNL RV IMPEDANCE VALUE: 360 Ohm
MDC IDC MSMT LEADCHNL RV PACING THRESHOLD AMPLITUDE: 1 V
MDC IDC MSMT LEADCHNL RV PACING THRESHOLD PULSEWIDTH: 0.5 ms
MDC IDC MSMT LEADCHNL RV SENSING INTR AMPL: 9.3 mV
MDC IDC SET LEADCHNL RV PACING AMPLITUDE: 1.25 V
MDC IDC SET LEADCHNL RV PACING PULSEWIDTH: 0.5 ms
MDC IDC STAT BRADY AP VP PERCENT: 99 %
MDC IDC STAT BRADY AP VS PERCENT: 1 %
MDC IDC STAT BRADY AS VP PERCENT: 1 %
MDC IDC STAT BRADY AS VS PERCENT: 1 %
Pulse Gen Model: 2210
Pulse Gen Serial Number: 7152830

## 2016-01-08 ENCOUNTER — Other Ambulatory Visit: Payer: Self-pay | Admitting: *Deleted

## 2016-01-08 NOTE — Patient Outreach (Signed)
[facility]) Care Management  01/08/2016  [name] 13-Oct-1941 [id number] RN Health Coach telephone call to patient.  Hipaa compliance verified. Per patient he is feeling ok. RN Health Coach asked patient did he receive the information sent to him.  Per patient he has all the information that he needs and that he doesn't feel he needs [facility] anymore.  ASSESSMENT Patient is discontinuing program due to he stated he has all the information he needs.  Plan RN will send a letter to patient and physician of case closure. RN will also notify the patient Physician by phone of closure.  Johny Shock, BSN, RN [facility] Care Management RN Health Coach Phone: [facility] complies with applicable Federal civil rights laws and does not discriminate on the basis of race, color, national origin, age, disability, or sex. Espaol (Spanish)  [place] cumple con las leyes federales de derechos civiles aplicables y no discrimina por motivos de raza, color, nacionalidad, edad, discapacidad o sexo.    Ti?[place] Vi?t ([place])  [place] tun th? lu?t dn quy?n hi?n hnh c?a Lin bang v khng phn bi?t ?i x? d?a trn ch?ng t?c, mu da, ngu?n g?c qu?c gia, ? tu?i, khuy?t t?t, ho?c gi?i tnh.    (Arabic)    [place]                      .

## 2016-01-09 ENCOUNTER — Encounter: Payer: Self-pay | Admitting: Cardiology

## 2016-01-11 ENCOUNTER — Encounter: Payer: Self-pay | Admitting: *Deleted

## 2016-01-20 ENCOUNTER — Encounter: Payer: Self-pay | Admitting: Endocrinology

## 2016-01-20 DIAGNOSIS — E1169 Type 2 diabetes mellitus with other specified complication: Secondary | ICD-10-CM | POA: Insufficient documentation

## 2016-01-20 DIAGNOSIS — E669 Obesity, unspecified: Secondary | ICD-10-CM

## 2016-01-24 ENCOUNTER — Other Ambulatory Visit: Payer: Self-pay | Admitting: Endocrinology

## 2016-01-24 NOTE — Telephone Encounter (Signed)
Please advise if ok to refill. Medication is listed under a historical provider.

## 2016-02-06 ENCOUNTER — Other Ambulatory Visit: Payer: Self-pay | Admitting: Endocrinology

## 2016-02-19 NOTE — Progress Notes (Signed)
Subjective:    Patient ID: [name], male    DOB: 1941/03/05, 75 y.o.   MRN: [id number]  HPI Pt returns for f/u of diabetes mellitus: DM type: Insulin-requiring type 2 Dx'ed: [id number] Complications: renal insufficiency, polyneuropathy, and CAD.   Therapy: insulin since 1989.  DKA: never Severe hypoglycemia: never.   Pancreatitis: several episodes, most recently in 2013. Other: he has severe insulin resistance; he takes multiple daily injections of U-500; variable cbg's have limited efforts at glycemic control.   Interval history: Pt states no change in chronic sob sensation in the chest, but no assoc chest pain.  he brings a record of his cbg's which i have reviewed today.  It varies from 40-258.  It is lowest at hs, and highest in am Past Medical History  Diagnosis Date  . COPD (chronic obstructive pulmonary disease) (HCC)     CPAP  . Renal insufficiency   . Hyperlipemia   . Hypothyroidism   . Hyperkalemia   . 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 Sick sinus syndrome ([place])     s/p PPM by JA  . Pancreatitis   . Morbid obesity ([place])   . OSA on CPAP     using CPAP although it is hard for him to tolerate  . CAD (coronary artery disease)   . Hypertensive cardiovascular-renal disease   . Depression   . Anxiety   . H/O Legionnaire's disease 2003  . History of blood clots     R groin  . Peripheral neuropathy ([place])   . Osteoarthritis     fingers  . GERD (gastroesophageal reflux disease)   . Anemia     hx of  . Pneumonia 2003  . Cancer [facility]) 2014    bladder cancer AND RIGHT URETERAL CANCER  . Diastolic dysfunction with chronic heart failure ([place])   . Persistent atrial fibrillation ([place])   . Atypical atrial flutter ([place])   . Venous insufficiency   . Complete heart block (HCC)     cardioverted from A.Fib,hx. Sick sinus syndrome -Pacemaker implanted  . Pacemaker     implanted [employer] 7'11  . CHF (congestive heart failure) ([place])   . Gait difficulty     slow  gait"ambulates with walker"  . Shortness of breath dyspnea     with exertion  . Diabetes mellitus     TypeII  . History of kidney stones     Past Surgical History  Procedure Laterality Date  . Nasal septum surgery  1967  . Pacemaker placement  06/2010    [facility] Accent RF DR, Model [id number] ( Serial number [id number])  . Thromboembolectomy and four compartment fasciotomy Right 2009    leg  . Cholecystectomy  2012  . Insert / replace / remove pacemaker  2011  . Transurethral resection of bladder tumor with gyrus (turbt-gyrus) N/A 10/26/2013    Procedure: TRANSURETHRAL RESECTION OF BLADDER TUMOR WITH GYRUS (TURBT-GYRUS);  Surgeon: Alexis Frock, MD;  Location: [facility];  Service: Urology;  Laterality: N/A;  . Cystoscopy w/ ureteral stent placement Right 10/26/2013    Procedure: CYSTOSCOPY WITH RETROGRADE PYELOGRAM/URETERAL STENT PLACEMENT;  Surgeon: Alexis Frock, MD;  Location: [facility];  Service: Urology;  Laterality: Right;  . Embolectomy Left 11/02/2013    Procedure: LEFT FEMORAL EMBOLECTOMY, LEFT FEMORAL ARTERY ENDARTERECTOMY WITH DACRON PATCH ANGIOPLASTY.;  Surgeon: Mal Misty, MD;  Location: [place];  Service: Vascular;  Laterality: Left;  . Transurethral  resection of bladder tumor with gyrus (turbt-gyrus) N/A 01/11/2014    Procedure: TRANSURETHRAL RESECTION OF BLADDER TUMOR WITH GYRUS (TURBT-GYRUS);  Surgeon: Alexis Frock, MD;  Location: [facility];  Service: Urology;  Laterality: N/A;  . Cystoscopy with ureteroscopy and stent placement Right 01/11/2014    Procedure: CYSTOSCOPY WITH URETEROSCOPY ,RIGHT RETROGRADE AND STENT CHANGE AND LASER OF URETERAL TUMOR;  Surgeon: Alexis Frock, MD;  Location: [facility];  Service: Urology;  Laterality: Right;  . Holmium laser application Right [id number]    Procedure: HOLMIUM LASER APPLICATION;  Surgeon: Alexis Frock, MD;  Location: [facility];  Service: Urology;  Laterality: Right;  . Tee without cardioversion N/A 05/16/2014    Procedure:  TRANSESOPHAGEAL ECHOCARDIOGRAM (TEE);  Surgeon: Josue Hector, MD;  Location: [facility] ENDOSCOPY;  Service: Cardiovascular;  Laterality: N/A;  . Cardioversion N/A 05/16/2014    Procedure: CARDIOVERSION;  Surgeon: Josue Hector, MD;  Location[facility];  Service: Cardiovascular;  Laterality: N/A;  . Cardiac catheterization      11-02-13  . Cystoscopy/retrograde/ureteroscopy Right 08/23/2014    Procedure: CYSTOSCOPY/RETROGRADE/ DIAGNOSTIC URETEROSCOPY/RIGHT RENAL STONE EXTRACTION;  Surgeon: Alexis Frock, MD;  Location: [facility];  Service: Urology;  Laterality: Right;  . Cardioversion N/A 11/08/2014    Procedure: CARDIOVERSION;  Surgeon: Fay Records, MD;  Location: [place];  Service: Cardiovascular;  Laterality: N/A;  . Eye surgery Left     cataract  . Belpharoptosis repair Right     Glaucoma  . Wisdom tooth extraction    . Groin debridement Right 05/08/2015    Procedure: REMOVAL OF RIGHT GROIN MASS;  Surgeon: Angelia Mould, MD;  Location: [facility] OR;  Service: Vascular;  Laterality: Right;  . Cardioversion N/A 06/22/2015    Procedure: CARDIOVERSION;  Surgeon: Jerline Pain, MD;  Location: [facility] ENDOSCOPY;  Service: Cardiovascular;  Laterality: N/A;    Social History   Social History  . Marital Status: Married    Spouse Name: N/A  . Number of Children: N/A  . 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 Smoking status: Former Smoker -- 2.00 packs/day for 50 years    Types: Cigarettes    Quit date: 12/08/2006  . Smokeless tobacco: Never Used  . Alcohol Use: No     Comment: quit 3 years ago  . Drug Use: No  . Sexual Activity: Not Currently   Other Topics Concern  . Not on file   Social History Narrative    Current Outpatient Prescriptions on File Prior to Visit  Medication Sig Dispense Refill  . ACCU-CHEK AVIVA PLUS test strip USE 3 TIMES PER DAY.  300 each 2  . acetaminophen (TYLENOL) 500 MG tablet Take  500-1,000 mg by mouth 2 (two) times daily as needed for moderate pain.     Marland Kitchen albuterol (PROVENTIL HFA;VENTOLIN HFA) 108 (90 BASE) MCG/ACT inhaler Inhale 2 puffs into the lungs every 6 (six) hours as needed for wheezing or shortness of breath. 1 Inhaler 6  . ALPRAZolam (XANAX) 0.25 MG tablet TAKE 1 TABLET BY MOUTH 3 TIMES A DAY AS NEEDED FOR ANXIETY OR SLEEP 50 tablet 2  . amiodarone (PACERONE) 200 MG tablet Take 1 tablet (200 mg total) by mouth daily. 60 tablet 6  . apixaban (ELIQUIS) 5 MG TABS tablet Take 1 tablet (5 mg total) by mouth 2 (two) times daily. 180 tablet 3  . Ascorbic Acid (VITAMIN C) 500 MG tablet  Take 500 mg by mouth daily.     . brimonidine (ALPHAGAN P) 0.1 % SOLN Place 1 drop into the left eye 3 (three) times daily.     . calcium carbonate (TUMS - DOSED IN MG ELEMENTAL CALCIUM) 500 MG chewable tablet Chew 2 tablets by mouth 2 (two) times daily as needed for indigestion or heartburn.    . cholecalciferol (VITAMIN D) 1000 UNITS tablet Take 1,000 Units by mouth daily.    Marland Kitchen dextromethorphan (DELSYM) 30 MG/5ML liquid Take 90 mg by mouth 2 (two) times daily as needed for cough.    . docusate sodium (COLACE) 100 MG capsule Take 100 mg by mouth daily as needed for mild constipation.    . dorzolamide-timolol (COSOPT) 22.3-6.8 MG/ML ophthalmic solution Place 1 drop into both eyes 2 (two) times daily.     . fluticasone (FLONASE) 50 MCG/ACT nasal spray Place 2 sprays into both nostrils daily as needed for allergies or rhinitis.    . furosemide (LASIX) 40 MG tablet Take 40 mg by mouth every morning. Take 20 mg by mouth every evening    . guaifenesin (HUMIBID E) 400 MG TABS tablet Take 400 mg by mouth 2 (two) times daily as needed (for cough or congestion).    . Insulin Syringe-Needle U-100 30G X 5/16" 1 ML MISC Use to inject insulin. 500 each 2  . Lactobacillus (ACIDOPHILUS) CAPS capsule Take 1 capsule by mouth daily.    Marland Kitchen latanoprost (XALATAN) 0.005 % ophthalmic solution Place 1 drop into the  left eye at bedtime.     Marland Kitchen levothyroxine (SYNTHROID, LEVOTHROID) 50 MCG tablet TAKE 1 TABLET EVERY DAY BEFORE BREAKFAST 90 tablet 3  . metoprolol tartrate (LOPRESSOR) 25 MG tablet Take 1 tablet (25 mg total) by mouth 3 (three) times daily. 270 tablet 3  . Multiple Vitamin (MULTIVITAMIN WITH MINERALS) TABS tablet Take 1 tablet by mouth daily.    . Omega-3 Fatty Acids (FISH OIL) 1000 MG CAPS Take 1,000 mg by mouth every morning.     Marland Kitchen omeprazole (PRILOSEC) 40 MG capsule TAKE 1 CAPSULE EVERY DAY 90 capsule 1  . rosuvastatin (CRESTOR) 40 MG tablet Take 20 mg by mouth daily.    . traMADol (ULTRAM) 50 MG tablet TAKE 2 TABLETS BY MOUTH TWICE A DAY AS NEEDED 50 tablet 2   No current facility-administered medications on file prior to visit.    Allergies  Allergen Reactions  . Actos [Pioglitazone] Swelling  . Timolol Other (See Comments)    Slow heart rate but tolerates Cosopt (dorzolamide-timolol)    Family History  Problem Relation Age of Onset  . Liver cancer Mother     deceased age 54  . Cancer Mother     liver cancer  . Colon cancer Neg Hx   . Heart attack Father     BP 114/60 mmHg  Pulse 73  Temp(Src) 98.1 F (36.7 C) (Oral)  Resp 12  Wt 318 lb (144.244 kg)  SpO2 96%  Review of Systems Denies LOC    Objective:   Physical Exam VITAL SIGNS:  See vs page GENERAL: no distress LUNGS:  Clear to auscultation, except for decreased BS at the bases. CV: dorsalis pedis pulses intact bilat.  EXTEMITIES: no deformity. 1+ bilat leg edema. . There is bilateral onychomycosis.   Neuro: sensation is intact to touch on the feet, but decreased from normal. Pulses: dorsalis pedis intact bilat. Skin: normal color and temp on the feet.  No ulcer. Several healed surgical scars at the  right leg. There is very dry skin andhyperpigmentation of the legs.    i personally reviewed spirometry tracing (today): Indication: sob Impression: mod obstruction  A1c=7.3%    Assessment & Plan:  DM:  The pattern of his cbg's indicates he needs some adjustment in his therapy Copd: new.   Please change the insulin to 3 times a day (just before each meal) 325-250-200, and 125 units with your bedtime snack.  Her is a prescription to change to novolin R  A chest x-ray is requested for you today.  We'll let you know about the results.  i have sent a prescription to your pharmacy, to add a twice a day inhaler.  In addition, you can take the albuterol inhaler as needed.     . Subjective:   Patient here for Medicare annual wellness visit and management of other chronic and acute problems.     Risk factors: advanced age    66 of Physicians Providing Medical Care to Patient:  See "snapshot"   Activities of Daily Living: In your present state of health, do you have any difficulty performing the following activities (lives with wife)?:  Preparing food and eating?: No  Bathing yourself: No  Getting dressed: No  Using the toilet:No  Moving around from place to place: No  In the past year have you fallen or had a near fall?: No    Home Safety: Has smoke detector and wears seat belts. Firearms are safely stored. No excess sun exposure.   Diet and Exercise  Current exercise habits: limited by health problems Dietary issues discussed: pt says not very good.    Depression Screen  Q1: Over the past two weeks, have you felt down, depressed or hopeless? no  Q2: Over the past two weeks, have you felt little interest or pleasure in doing things? no   The following portions of the patient's history were reviewed and updated as appropriate: allergies, current medications, past family history, past medical history, past social history, past surgical history and problem list.   Review of Systems  No change in chronic hearing or visual loss Objective:   Vision:  Sees opthalmologist Hearing: grossly decreased Body mass index:  See vs page Msk: pt easily and quickly performs "get-up-and-go" from a  sitting position Cognitive Impairment Assessment: cognition, memory and judgment appear normal.  remembers 3/3 at 5 minutes.  excellent recall.  can easily read and write a sentence.  alert and oriented x 3   Assessment:   Medicare wellness utd on preventive parameters    Plan:   During the course of the visit the patient was educated and counseled about appropriate screening and preventive services including:       Fall prevention    Diabetes screening  Nutrition counseling   Vaccines / LABS Zostavax / Pneumococcal Vaccine  today  PSA  Patient Instructions (the written plan) was given to the patient.

## 2016-02-19 NOTE — Patient Instructions (Addendum)
Please change the insulin to 3 times a day (just before each meal) 325-250-200, and 125 units with your bedtime snack.  Her is a prescription to change to novolin R Please come back for a regular physical appointment in 2 months.   check your blood sugar twice a day.  vary the time of day when you check, between before the 3 meals, and at bedtime.  also check if you have symptoms of your blood sugar being too high or too low.  please keep a record of the readings and bring it to your next appointment here.  You can write it on any piece of paper.  please call us sooner if your blood sugar goes below 70, or if you have a lot of readings over 200.   A chest x-ray is requested for you today.  We'll let you know about the results.  good diet and exercise significantly improve the control of your diabetes.  please let me know if you wish to be referred to a dietician.  high blood sugar is very risky to your health.  you should see an eye doctor and dentist every year.  It is very important to get all recommended vaccinations.  please consider these measures for your health:  minimize alcohol.  do not use tobacco products.  have a colonoscopy at least every 10 years from age 64. keep firearms safely stored.  always use seat belts.  have working smoke alarms in your home.  see an eye doctor and dentist regularly.  never drive under the influence of alcohol or drugs (including prescription drugs).  those with fair skin should take precautions against the sun. it is critically important to prevent falling down (keep floor areas well-lit, dry, and free of loose objects.  If you have a cane, walker, or wheelchair, you should use it, even for short trips around the house.  Wear flat-soled shoes.  Also, try not to rush) i have sent a prescription to your pharmacy, to add a twice a day inhaler.  In addition, you can take the albuterol inhaler as needed.

## 2016-02-20 ENCOUNTER — Ambulatory Visit (INDEPENDENT_AMBULATORY_CARE_PROVIDER_SITE_OTHER): Payer: Commercial Managed Care - HMO | Admitting: Endocrinology

## 2016-02-20 ENCOUNTER — Encounter: Payer: Self-pay | Admitting: Endocrinology

## 2016-02-20 ENCOUNTER — Telehealth: Payer: Self-pay | Admitting: Endocrinology

## 2016-02-20 ENCOUNTER — Other Ambulatory Visit (INDEPENDENT_AMBULATORY_CARE_PROVIDER_SITE_OTHER): Payer: Commercial Managed Care - HMO | Admitting: *Deleted

## 2016-02-20 VITALS — BP 114/60 | HR 73 | Temp 98.1°F | Resp 12 | Wt 318.0 lb

## 2016-02-20 DIAGNOSIS — R0602 Shortness of breath: Secondary | ICD-10-CM | POA: Diagnosis not present

## 2016-02-20 DIAGNOSIS — Z794 Long term (current) use of insulin: Secondary | ICD-10-CM | POA: Diagnosis not present

## 2016-02-20 DIAGNOSIS — E1122 Type 2 diabetes mellitus with diabetic chronic kidney disease: Secondary | ICD-10-CM | POA: Diagnosis not present

## 2016-02-20 DIAGNOSIS — N183 Chronic kidney disease, stage 3 (moderate): Secondary | ICD-10-CM | POA: Diagnosis not present

## 2016-02-20 MED ORDER — INSULIN REGULAR HUMAN (CONC) 500 UNIT/ML ~~LOC~~ SOLN: SUBCUTANEOUS | Status: DC

## 2016-02-20 MED ORDER — FLUTICASONE-SALMETEROL 100-50 MCG/DOSE IN AEPB: 1.0000 | INHALATION_SPRAY | Freq: Two times a day (BID) | RESPIRATORY_TRACT | Status: DC

## 2016-02-20 MED ORDER — INSULIN REGULAR HUMAN 100 UNIT/ML IJ SOLN: INTRAMUSCULAR | Status: DC

## 2016-02-20 NOTE — Telephone Encounter (Signed)
Returned call to pharmacist. She was concerned about how many inj the pt will have to give himself. She wants to know how many inj she will have to advise the pt to do, to be able to get all of the insulin in per dose. He will need to break up the amount because the tissue will not absorb that much insulin in one area at a time. Please advise.

## 2016-02-20 NOTE — Telephone Encounter (Signed)
U-500 was declined by insurance. Pt will draw up 100 units at a time, so he should have 100-unit syringes

## 2016-02-20 NOTE — Progress Notes (Signed)
we discussed code status.  pt requests full code, but would not want to be started or maintained on artificial life-support measures if there was not a reasonable chance of recovery 

## 2016-02-20 NOTE — Telephone Encounter (Signed)
Spoke with pharmacist. She said pt is going to get his med through his mail order pharmacy. She put the refill she has on hold.

## 2016-02-20 NOTE — Telephone Encounter (Signed)
[facility] pharmacy called and would like to verify the correct dosage on [name] Novolin R     Thank you

## 2016-02-22 ENCOUNTER — Telehealth: Payer: Self-pay | Admitting: Endocrinology

## 2016-02-22 DIAGNOSIS — I4891 Unspecified atrial fibrillation: Secondary | ICD-10-CM

## 2016-02-22 MED ORDER — "INSULIN SYRINGE-NEEDLE U-100 30G X 5/16"" 1 ML MISC": Status: DC

## 2016-02-22 NOTE — Telephone Encounter (Signed)
Rx submitted for u500 syringe.

## 2016-02-22 NOTE — Telephone Encounter (Signed)
[facility] note dated 02/21/16 5:53 pm Dan with [employer] calling to know if pt is using u-500 syringes or u-100 syringes with his humulin u-500 that pt takes 325 u before breakfast, 250 u before lunch and 200 u before dinner and 125 u with bedtime snack.

## 2016-02-22 NOTE — Telephone Encounter (Signed)
See note below to be advised.  Thanks! 

## 2016-02-22 NOTE — Telephone Encounter (Signed)
Patient called stating that he would need a referral sent to Dr. Rayann Heman  Please advise    Thank you

## 2016-02-22 NOTE — Telephone Encounter (Signed)
done

## 2016-02-27 ENCOUNTER — Encounter: Payer: Commercial Managed Care - HMO | Admitting: Internal Medicine

## 2016-03-17 ENCOUNTER — Telehealth: Payer: Self-pay | Admitting: Endocrinology

## 2016-03-17 NOTE — Telephone Encounter (Signed)
See note below and please advise if you could place the referral during Dr. [name] absence, Thanks!

## 2016-03-17 NOTE — Telephone Encounter (Signed)
Patient need a referral to [facility] Dr weaver his appointment time 4/17 @ 2:45

## 2016-03-18 ENCOUNTER — Other Ambulatory Visit: Payer: Self-pay | Admitting: Endocrinology

## 2016-03-18 DIAGNOSIS — E1122 Type 2 diabetes mellitus with diabetic chronic kidney disease: Secondary | ICD-10-CM

## 2016-03-18 DIAGNOSIS — N183 Chronic kidney disease, stage 3 unspecified: Secondary | ICD-10-CM

## 2016-03-18 DIAGNOSIS — Z794 Long term (current) use of insulin: Principal | ICD-10-CM

## 2016-03-18 NOTE — Telephone Encounter (Signed)
Referred for diabetes

## 2016-03-24 DIAGNOSIS — H401123 Primary open-angle glaucoma, left eye, severe stage: Secondary | ICD-10-CM | POA: Diagnosis not present

## 2016-03-24 DIAGNOSIS — E119 Type 2 diabetes mellitus without complications: Secondary | ICD-10-CM | POA: Diagnosis not present

## 2016-03-24 DIAGNOSIS — H401113 Primary open-angle glaucoma, right eye, severe stage: Secondary | ICD-10-CM | POA: Diagnosis not present

## 2016-03-24 DIAGNOSIS — Z961 Presence of intraocular lens: Secondary | ICD-10-CM | POA: Diagnosis not present

## 2016-03-24 LAB — HM DIABETES EYE EXAM

## 2016-03-25 ENCOUNTER — Encounter: Payer: Self-pay | Admitting: Endocrinology

## 2016-03-27 ENCOUNTER — Encounter: Payer: Self-pay | Admitting: Endocrinology

## 2016-03-31 ENCOUNTER — Other Ambulatory Visit: Payer: Self-pay | Admitting: Endocrinology

## 2016-03-31 NOTE — Telephone Encounter (Signed)
Please advise if ok to refill. Rx is listed under a historical provider. Thanks! 

## 2016-04-02 ENCOUNTER — Encounter: Payer: Self-pay | Admitting: Endocrinology

## 2016-04-09 ENCOUNTER — Other Ambulatory Visit: Payer: Self-pay | Admitting: Nurse Practitioner

## 2016-04-28 ENCOUNTER — Telehealth: Payer: Self-pay | Admitting: Endocrinology

## 2016-04-28 DIAGNOSIS — I480 Paroxysmal atrial fibrillation: Secondary | ICD-10-CM

## 2016-04-28 NOTE — Telephone Encounter (Signed)
See note below and please advise, Thanks! 

## 2016-04-28 NOTE — Telephone Encounter (Signed)
done

## 2016-04-28 NOTE — Telephone Encounter (Signed)
Patient stated he need a referral to his cardiologist Dr Rayann Heman his appointment is 05-07-16

## 2016-04-30 ENCOUNTER — Other Ambulatory Visit: Payer: Self-pay

## 2016-04-30 ENCOUNTER — Ambulatory Visit (INDEPENDENT_AMBULATORY_CARE_PROVIDER_SITE_OTHER): Payer: Commercial Managed Care - HMO | Admitting: Endocrinology

## 2016-04-30 ENCOUNTER — Encounter: Payer: Self-pay | Admitting: Endocrinology

## 2016-04-30 VITALS — BP 118/64 | HR 78 | Temp 97.5°F | Ht 70.0 in | Wt 313.0 lb

## 2016-04-30 DIAGNOSIS — D509 Iron deficiency anemia, unspecified: Secondary | ICD-10-CM | POA: Diagnosis not present

## 2016-04-30 DIAGNOSIS — E1129 Type 2 diabetes mellitus with other diabetic kidney complication: Secondary | ICD-10-CM | POA: Diagnosis not present

## 2016-04-30 DIAGNOSIS — R0600 Dyspnea, unspecified: Secondary | ICD-10-CM | POA: Diagnosis not present

## 2016-04-30 DIAGNOSIS — E7800 Pure hypercholesterolemia, unspecified: Secondary | ICD-10-CM

## 2016-04-30 DIAGNOSIS — E790 Hyperuricemia without signs of inflammatory arthritis and tophaceous disease: Secondary | ICD-10-CM

## 2016-04-30 DIAGNOSIS — E1122 Type 2 diabetes mellitus with diabetic chronic kidney disease: Secondary | ICD-10-CM

## 2016-04-30 DIAGNOSIS — E039 Hypothyroidism, unspecified: Secondary | ICD-10-CM

## 2016-04-30 DIAGNOSIS — Z0000 Encounter for general adult medical examination without abnormal findings: Secondary | ICD-10-CM

## 2016-04-30 DIAGNOSIS — N183 Chronic kidney disease, stage 3 unspecified: Secondary | ICD-10-CM

## 2016-04-30 DIAGNOSIS — I480 Paroxysmal atrial fibrillation: Secondary | ICD-10-CM

## 2016-04-30 DIAGNOSIS — I2510 Atherosclerotic heart disease of native coronary artery without angina pectoris: Secondary | ICD-10-CM

## 2016-04-30 DIAGNOSIS — Z794 Long term (current) use of insulin: Secondary | ICD-10-CM | POA: Diagnosis not present

## 2016-04-30 LAB — IBC PANEL
IRON: 83 ug/dL (ref 42–165)
SATURATION RATIOS: 18.1 % — AB (ref 20.0–50.0)
TRANSFERRIN: 327 mg/dL (ref 212.0–360.0)

## 2016-04-30 LAB — LDL CHOLESTEROL, DIRECT: LDL DIRECT: 57 mg/dL

## 2016-04-30 LAB — HEPATIC FUNCTION PANEL
ALK PHOS: 97 U/L (ref 39–117)
ALT: 23 U/L (ref 0–53)
AST: 23 U/L (ref 0–37)
Albumin: 4.1 g/dL (ref 3.5–5.2)
BILIRUBIN DIRECT: 0.1 mg/dL (ref 0.0–0.3)
TOTAL PROTEIN: 7.7 g/dL (ref 6.0–8.3)
Total Bilirubin: 0.4 mg/dL (ref 0.2–1.2)

## 2016-04-30 LAB — CBC WITH DIFFERENTIAL/PLATELET
BASOS ABS: 0 10*3/uL (ref 0.0–0.1)
Basophils Relative: 0.3 % (ref 0.0–3.0)
EOS ABS: 0.2 10*3/uL (ref 0.0–0.7)
Eosinophils Relative: 2.4 % (ref 0.0–5.0)
HCT: 42 % (ref 39.0–52.0)
Hemoglobin: 13.9 g/dL (ref 13.0–17.0)
LYMPHS ABS: 2.7 10*3/uL (ref 0.7–4.0)
LYMPHS PCT: 27.7 % (ref 12.0–46.0)
MCHC: 33.1 g/dL (ref 30.0–36.0)
MCV: 93.6 fl (ref 78.0–100.0)
MONO ABS: 0.7 10*3/uL (ref 0.1–1.0)
Monocytes Relative: 7.6 % (ref 3.0–12.0)
NEUTROS ABS: 6 10*3/uL (ref 1.4–7.7)
NEUTROS PCT: 62 % (ref 43.0–77.0)
PLATELETS: 266 10*3/uL (ref 150.0–400.0)
RBC: 4.49 Mil/uL (ref 4.22–5.81)
RDW: 15.8 % — AB (ref 11.5–15.5)
WBC: 9.7 10*3/uL (ref 4.0–10.5)

## 2016-04-30 LAB — TSH: TSH: 5.66 u[IU]/mL — ABNORMAL HIGH (ref 0.35–4.50)

## 2016-04-30 LAB — URIC ACID: Uric Acid, Serum: 8.2 mg/dL — ABNORMAL HIGH (ref 4.0–7.8)

## 2016-04-30 LAB — BASIC METABOLIC PANEL
BUN: 39 mg/dL — ABNORMAL HIGH (ref 6–23)
CO2: 26 meq/L (ref 19–32)
Calcium: 9.5 mg/dL (ref 8.4–10.5)
Chloride: 102 mEq/L (ref 96–112)
Creatinine, Ser: 1.65 mg/dL — ABNORMAL HIGH (ref 0.40–1.50)
GFR: 43.67 mL/min — ABNORMAL LOW (ref >60.00)
GLUCOSE: 208 mg/dL — AB (ref 70–99)
POTASSIUM: 4.3 meq/L (ref 3.5–5.1)
SODIUM: 136 meq/L (ref 135–145)

## 2016-04-30 LAB — BRAIN NATRIURETIC PEPTIDE: Pro B Natriuretic peptide (BNP): 60 pg/mL (ref 0.0–100.0)

## 2016-04-30 LAB — LIPID PANEL
CHOLESTEROL: 137 mg/dL (ref 0–200)
HDL: 27.4 mg/dL — ABNORMAL LOW (ref >39.00)
Total CHOL/HDL Ratio: 5
Triglycerides: 507 mg/dL — ABNORMAL HIGH (ref 0.0–149.0)

## 2016-04-30 LAB — POCT GLYCOSYLATED HEMOGLOBIN (HGB A1C): Hemoglobin A1C: 7.5

## 2016-04-30 MED ORDER — ALLOPURINOL 100 MG PO TABS: 100.0000 mg | ORAL_TABLET | Freq: Every day | ORAL | Status: DC

## 2016-04-30 MED ORDER — FLUTICASONE PROPIONATE 50 MCG/ACT NA SUSP: 2.0000 | Freq: Every day | NASAL | Status: DC | PRN

## 2016-04-30 MED ORDER — INSULIN REGULAR HUMAN 100 UNIT/ML IJ SOLN: INTRAMUSCULAR | Status: DC

## 2016-04-30 MED ORDER — LEVOTHYROXINE SODIUM 75 MCG PO TABS
75.0000 ug | ORAL_TABLET | Freq: Every day | ORAL | Status: DC
Start: 2016-04-30 — End: 2016-12-23

## 2016-04-30 NOTE — Progress Notes (Signed)
we discussed code status.  pt requests full code, but would not want to be started or maintained on artificial life-support measures if there was not a reasonable chance of recovery 

## 2016-04-30 NOTE — Progress Notes (Signed)
Subjective:    Patient ID: [name], male    DOB: 29-Mar-1941, 75 y.o.   MRN: [id number]  HPI Pt is here for regular wellness examination, and is feeling pretty well in general, and says chronic med probs are stable, except as noted below Past Medical History  Diagnosis Date  . COPD (chronic obstructive pulmonary disease) (HCC)     CPAP  . Renal insufficiency   . Hyperlipemia   . Hypothyroidism   . Hyperkalemia   . 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 Sick sinus syndrome ([place])     s/p PPM by JA  . Pancreatitis   . Morbid obesity ([place])   . OSA on CPAP     using CPAP although it is hard for him to tolerate  . CAD (coronary artery disease)   . Hypertensive cardiovascular-renal disease   . Depression   . Anxiety   . H/O Legionnaire's disease 2003  . History of blood clots     R groin  . Peripheral neuropathy ([place])   . Osteoarthritis     fingers  . GERD (gastroesophageal reflux disease)   . Anemia     hx of  . Pneumonia 2003  . Cancer [facility]) 2014    bladder cancer AND RIGHT URETERAL CANCER  . Diastolic dysfunction with chronic heart failure ([place])   . Persistent atrial fibrillation ([place])   . Atypical atrial flutter ([place])   . Venous insufficiency   . Complete heart block (HCC)     cardioverted from A.Fib,hx. Sick sinus syndrome -Pacemaker implanted  . Pacemaker     implanted St. Jude 7'11  . CHF (congestive heart failure) ([place])   . Gait difficulty     slow gait"ambulates with walker"  . Shortness of breath dyspnea     with exertion  . Diabetes mellitus     TypeII  . History of kidney stones     Past Surgical History  Procedure Laterality Date  . Nasal septum surgery  1967  . Pacemaker placement  06/2010    [facility] Accent RF DR, Model [phone] ( Serial number [id number])  . Thromboembolectomy and four compartment fasciotomy Right 2009    leg  . Cholecystectomy  2012  . Insert / replace / remove pacemaker  2011  . Transurethral resection of  bladder tumor with gyrus (turbt-gyrus) N/A 10/26/2013    Procedure: TRANSURETHRAL RESECTION OF BLADDER TUMOR WITH GYRUS (TURBT-GYRUS);  Surgeon: Alexis Frock, MD;  [facility];  Service: Urology;  Laterality: N/A;  . Cystoscopy w/ ureteral stent placement Right 10/26/2013    Procedure: CYSTOSCOPY WITH RETROGRADE PYELOGRAM/URETERAL STENT PLACEMENT;  Surgeon: Alexis Frock, MD;  [facility];  Service: Urology;  Laterality: Right;  . Embolectomy Left 11/02/2013    Procedure: LEFT FEMORAL EMBOLECTOMY, LEFT FEMORAL ARTERY ENDARTERECTOMY WITH DACRON PATCH ANGIOPLASTY.;  Surgeon: Mal Misty, MD;  Location: [place];  Service: Vascular;  Laterality: Left;  . Transurethral resection of bladder tumor with gyrus (turbt-gyrus) N/A 01/11/2014    Procedure: TRANSURETHRAL RESECTION OF BLADDER TUMOR WITH GYRUS (TURBT-GYRUS);  Surgeon: Alexis Frock, MD;  [facility];  Service: Urology;  Laterality: N/A;  . Cystoscopy with ureteroscopy and stent placement Right 01/11/2014    Procedure: CYSTOSCOPY WITH URETEROSCOPY ,RIGHT RETROGRADE AND STENT CHANGE AND LASER OF URETERAL TUMOR;  Surgeon: Alexis Frock, MD;  [facility];  Service: Urology;  Laterality: Right;  . Holmium laser application Right [id number]  Procedure: HOLMIUM LASER APPLICATION;  Surgeon: Alexis Frock, MD;  [facility];  Service: Urology;  Laterality: Right;  . Tee without cardioversion N/A 05/16/2014    Procedure: TRANSESOPHAGEAL ECHOCARDIOGRAM (TEE);  Surgeon: Josue Hector, MD;  Location: [facility] ENDOSCOPY;  Service: Cardiovascular;  Laterality: N/A;  . Cardioversion N/A 05/16/2014    Procedure: CARDIOVERSION;  Surgeon: Josue Hector, MD;  Location: [facility] ENDOSCOPY;  Service: Cardiovascular;  Laterality: N/A;  . Cardiac catheterization      11-02-13  . Cystoscopy/retrograde/ureteroscopy Right 08/23/2014    Procedure: CYSTOSCOPY/RETROGRADE/ DIAGNOSTIC URETEROSCOPY/RIGHT RENAL STONE EXTRACTION;  Surgeon: Alexis Frock, MD;   [facility];  Service: Urology;  Laterality: Right;  . Cardioversion N/A 11/08/2014    Procedure: CARDIOVERSION;  Surgeon: Fay Records, MD;  Location: [place];  Service: Cardiovascular;  Laterality: N/A;  . Eye surgery Left     cataract  . Belpharoptosis repair Right     Glaucoma  . Wisdom tooth extraction    . Groin debridement Right 05/08/2015    Procedure: REMOVAL OF RIGHT GROIN MASS;  Surgeon: Angelia Mould, MD;  [facility] OR;  Service: Vascular;  Laterality: Right;  . Cardioversion N/A 06/22/2015    Procedure: CARDIOVERSION;  Surgeon: Jerline Pain, MD;  Location: [facility] ENDOSCOPY;  Service: Cardiovascular;  Laterality: N/A;    Social History   Social History  . Marital Status: Married    Spouse Name: N/A  . Number of Children: N/A  . 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 Smoking status: Former Smoker -- 2.00 packs/day for 50 years    Types: Cigarettes    Quit date: 12/08/2006  . Smokeless tobacco: Never Used  . Alcohol Use: No     Comment: quit 3 years ago  . Drug Use: No  . Sexual Activity: Not Currently   Other Topics Concern  . Not on file   Social History Narrative    Current Outpatient Prescriptions on File Prior to Visit  Medication Sig Dispense Refill  . ACCU-CHEK AVIVA PLUS test strip USE 3 TIMES PER DAY.  300 each 2  . acetaminophen (TYLENOL) 500 MG tablet Take 500-1,000 mg by mouth 2 (two) times daily as needed for moderate pain.     Marland Kitchen albuterol (PROVENTIL HFA;VENTOLIN HFA) 108 (90 BASE) MCG/ACT inhaler Inhale 2 puffs into the lungs every 6 (six) hours as needed for wheezing or shortness of breath. 1 Inhaler 6  . ALPRAZolam (XANAX) 0.25 MG tablet TAKE 1 TABLET BY MOUTH 3 TIMES A DAY AS NEEDED FOR ANXIETY OR SLEEP 50 tablet 2  . amiodarone (PACERONE) 200 MG tablet Take 1 tablet (200 mg total) by mouth daily. 60 tablet 6  . apixaban (ELIQUIS) 5 MG TABS tablet Take 1  tablet (5 mg total) by mouth 2 (two) times daily. 180 tablet 3  . Ascorbic Acid (VITAMIN C) 500 MG tablet Take 500 mg by mouth daily.     . brimonidine (ALPHAGAN P) 0.1 % SOLN Place 1 drop into the left eye 3 (three) times daily.     . calcium carbonate (TUMS - DOSED IN MG ELEMENTAL CALCIUM) 500 MG chewable tablet Chew 2 tablets by mouth 2 (two) times daily as needed for indigestion or heartburn.    . cholecalciferol (VITAMIN D) 1000 UNITS tablet Take 1,000 Units by mouth daily.    Marland Kitchen dextromethorphan (DELSYM) 30 MG/5ML liquid Take 90  mg by mouth 2 (two) times daily as needed for cough.    . docusate sodium (COLACE) 100 MG capsule Take 100 mg by mouth daily as needed for mild constipation.    . dorzolamide-timolol (COSOPT) 22.3-6.8 MG/ML ophthalmic solution Place 1 drop into both eyes 2 (two) times daily.     . Fluticasone-Salmeterol (ADVAIR) 100-50 MCG/DOSE AEPB Inhale 1 puff into the lungs 2 (two) times daily. 1 each 11  . furosemide (LASIX) 40 MG tablet TAKE 1 TABLET IN THE MORNING AND 1/2 TABLET IN THE EVENING 135 tablet 3  . guaifenesin (HUMIBID E) 400 MG TABS tablet Take 400 mg by mouth 2 (two) times daily as needed (for cough or congestion).    . Insulin Syringe-Needle U-100 30G X 5/16" 1 ML MISC Use to inject insulin. (500 unit syringe) 500 each 2  . Lactobacillus (ACIDOPHILUS) CAPS capsule Take 1 capsule by mouth daily.    Marland Kitchen latanoprost (XALATAN) 0.005 % ophthalmic solution Place 1 drop into the left eye at bedtime.     . metoprolol tartrate (LOPRESSOR) 25 MG tablet Take 1 tablet (25 mg total) by mouth 3 (three) times daily. 270 tablet 3  . Multiple Vitamin (MULTIVITAMIN WITH MINERALS) TABS tablet Take 1 tablet by mouth daily.    . Omega-3 Fatty Acids (FISH OIL) 1000 MG CAPS Take 1,000 mg by mouth every morning.     Marland Kitchen omeprazole (PRILOSEC) 40 MG capsule TAKE 1 CAPSULE EVERY DAY 90 capsule 1  . rosuvastatin (CRESTOR) 40 MG tablet TAKE 1/2 TABLET EVERY EVENING 45 tablet 2  . traMADol  (ULTRAM) 50 MG tablet TAKE 2 TABLETS BY MOUTH TWICE A DAY AS NEEDED 50 tablet 2   No current facility-administered medications on file prior to visit.    Allergies  Allergen Reactions  . Actos [Pioglitazone] Swelling  . Timolol Other (See Comments)    Slow heart rate but tolerates Cosopt (dorzolamide-timolol)    Family History  Problem Relation Age of Onset  . Liver cancer Mother     deceased age 38  . Cancer Mother     liver cancer  . Colon cancer Neg Hx   . Heart attack Father     BP 118/64 mmHg  Pulse 78  Temp(Src) 97.5 F (36.4 C) (Oral)  Ht 5\' 10"  (1.778 m)  Wt 313 lb (141.976 kg)  BMI 44.91 kg/m2  SpO2 95%  Review of Systems  Constitutional: Negative for fever.  HENT: Negative for dental problem.   Eyes: Negative for visual disturbance.  Respiratory:       No change in chronic doe  Cardiovascular: Negative for chest pain.  Gastrointestinal: Negative for blood in stool.  Endocrine: Negative for cold intolerance.  Genitourinary: Negative for hematuria.  Musculoskeletal:       No change in chronic low back pain  Skin: Negative for rash.  Allergic/Immunologic: Positive for environmental allergies.  Neurological:       No change in chronic numbness of the toes  Hematological: Bruises/bleeds easily.  Psychiatric/Behavioral: Negative for dysphoric mood.       Objective:   Physical Exam VS: see vs page GEN: no distress.  Morbid obesity HEAD: head: no deformity eyes: no periorbital swelling, no proptosis external nose and ears are normal mouth: no lesion seen NECK: supple, thyroid is not enlarged CHEST WALL: no deformity LUNGS: clear to auscultation BREASTS:  No gynecomastia CV: reg rate and rhythm, no murmur ABD: abdomen is soft, nontender.  no hepatosplenomegaly.  not distended.  no hernia  GENITAL/RECTAL/PROSTATE: sees urology. MUSCULOSKELETAL: muscle bulk and strength are grossly normal.  no obvious joint swelling.  gait is steady with a  walker PULSES: no carotid bruit NEURO:  cn 2-12 grossly intact, except for hearing loss.   readily moves all 4's.   SKIN:  Normal texture and temperature.  No rash or suspicious lesion is visible.   NODES:  None palpable at the neck PSYCH: alert, well-oriented.  Does not appear anxious nor depressed.      Assessment & Plan:  Wellness visit today, with problems stable, except as noted.    SEPARATE EVALUATION FOLLOWS--EACH PROBLEM HERE IS NEW, NOT RESPONDING TO TREATMENT, OR POSES SIGNIFICANT RISK TO THE PATIENT'S HEALTH: HISTORY OF THE PRESENT ILLNESS: Pt returns for f/u of diabetes mellitus: DM type: Insulin-requiring type 2 Dx'ed: [id number] Complications: renal insufficiency, polyneuropathy, and CAD.   Therapy: insulin since 1989.  DKA: never Severe hypoglycemia: never.   Pancreatitis: several episodes, most recently in 2013. Other: he brings a record of his cbg's which i have reviewed today.  It varies from 39-420. It varies widely throughout the day, but is lowest at hs.  He has hypoglycemia at any time of day. PAST MEDICAL HISTORY reviewed and up to date today REVIEW OF SYSTEMS: No weight change and no LOC PHYSICAL EXAMINATION: VITAL SIGNS: See vs page GENERAL: no distress CV: dorsalis pedis pulses intact bilat.  EXTEMITIES: no deformity. 2+ bilat leg edema. . There is bilateral onychomycosis.  Neuro: sensation is intact to touch on the feet, but decreased from normal. Pulses: dorsalis pedis intact bilat. Skin: normal color and temp on the feet. No ulcer. Several healed surgical scars at the right leg. There is very dry skin andhyperpigmentation of the legs.  LAB/XRAY RESULTS: A1c=7.5% Lab Results  Component Value Date   TSH 5.66* 04/30/2016   Lab Results  Component Value Date   LABURIC 8.2* 04/30/2016   Lab Results  Component Value Date   CREATININE 1.65* 04/30/2016   BUN 39* 04/30/2016   NA 136 04/30/2016   K 4.3 04/30/2016   CL 102 04/30/2016   CO2 26  04/30/2016  IMPRESSION: Renal failure: this increase the risk of hypoglycemia DM: slightly overcontrolled AF and CAD: in these settings, he should avoid hypoglycemia Hyperuricemia: in view of renal failure, he needs rx for this Hypothyroidism: he needs increased rx PLAN:  Please reduce the insulin to 3 times a day (just before each meal) 325-250-180, and 125 units with your bedtime snack.   increase the thyroid pill, and one to lower the uric acid

## 2016-04-30 NOTE — Patient Instructions (Addendum)
Please reduce the insulin to 3 times a day (just before each meal) 325-250-180, and 125 units with your bedtime snack.   blood tests are requested for you today.  We'll let you know about the results. please consider these measures for your health:  minimize alcohol.  do not use tobacco products.  have a colonoscopy at least every 10 years from age 73.  Women should have an annual mammogram from age 31.  keep firearms safely stored.  always use seat belts.  have working smoke alarms in your home.  see an eye doctor and dentist regularly.  never drive under the influence of alcohol or drugs (including prescription drugs).  those with fair skin should take precautions against the sun.  it is critically important to prevent falling down (keep floor areas well-lit, dry, and free of loose objects.  If you have a cane, walker, or wheelchair, you should use it, even for short trips around the house.  Wear flat-soled shoes.  Also, try not to rush) Please come back for a follow-up appointment in 3 months.

## 2016-05-01 ENCOUNTER — Other Ambulatory Visit: Payer: Self-pay

## 2016-05-01 ENCOUNTER — Other Ambulatory Visit: Payer: Self-pay | Admitting: Endocrinology

## 2016-05-01 LAB — PTH, INTACT AND CALCIUM
CALCIUM: 9.1 mg/dL (ref 8.4–10.5)
PTH: 40 pg/mL (ref 14–64)

## 2016-05-01 MED ORDER — FLUTICASONE PROPIONATE 50 MCG/ACT NA SUSP: NASAL | Status: DC

## 2016-05-07 ENCOUNTER — Ambulatory Visit (INDEPENDENT_AMBULATORY_CARE_PROVIDER_SITE_OTHER): Payer: Commercial Managed Care - HMO | Admitting: Internal Medicine

## 2016-05-07 ENCOUNTER — Encounter: Payer: Self-pay | Admitting: Internal Medicine

## 2016-05-07 VITALS — BP 102/64 | HR 72 | Ht 70.0 in | Wt 314.8 lb

## 2016-05-07 DIAGNOSIS — I130 Hypertensive heart and chronic kidney disease with heart failure and stage 1 through stage 4 chronic kidney disease, or unspecified chronic kidney disease: Secondary | ICD-10-CM | POA: Diagnosis not present

## 2016-05-07 DIAGNOSIS — I495 Sick sinus syndrome: Secondary | ICD-10-CM

## 2016-05-07 DIAGNOSIS — I509 Heart failure, unspecified: Secondary | ICD-10-CM

## 2016-05-07 DIAGNOSIS — I480 Paroxysmal atrial fibrillation: Secondary | ICD-10-CM | POA: Diagnosis not present

## 2016-05-07 LAB — CUP PACEART INCLINIC DEVICE CHECK
Brady Statistic RA Percent Paced: 99.46 %
Date Time Interrogation Session: 20170531171354
Implantable Lead Location: 753859
Lead Channel Impedance Value: 400 Ohm
Lead Channel Pacing Threshold Amplitude: 0.75 V
Lead Channel Pacing Threshold Amplitude: 1 V
Lead Channel Pacing Threshold Pulse Width: 0.5 ms
Lead Channel Pacing Threshold Pulse Width: 0.5 ms
Lead Channel Pacing Threshold Pulse Width: 0.5 ms
Lead Channel Sensing Intrinsic Amplitude: 3.4 mV
MDC IDC LEAD IMPLANT DT: 20110705
MDC IDC LEAD IMPLANT DT: 20110705
MDC IDC LEAD LOCATION: 753860
MDC IDC MSMT BATTERY REMAINING LONGEVITY: 56.4
MDC IDC MSMT BATTERY VOLTAGE: 2.89 V
MDC IDC MSMT LEADCHNL RA PACING THRESHOLD AMPLITUDE: 1 V
MDC IDC MSMT LEADCHNL RA PACING THRESHOLD PULSEWIDTH: 0.5 ms
MDC IDC MSMT LEADCHNL RV IMPEDANCE VALUE: 337.5 Ohm
MDC IDC MSMT LEADCHNL RV PACING THRESHOLD AMPLITUDE: 0.75 V
MDC IDC MSMT LEADCHNL RV SENSING INTR AMPL: 9.3 mV
MDC IDC PG SERIAL: 7152830
MDC IDC SET LEADCHNL RA PACING AMPLITUDE: 1.875
MDC IDC SET LEADCHNL RV PACING AMPLITUDE: 1 V
MDC IDC SET LEADCHNL RV PACING PULSEWIDTH: 0.5 ms
MDC IDC SET LEADCHNL RV SENSING SENSITIVITY: 4 mV
MDC IDC STAT BRADY RV PERCENT PACED: 99.91 %
Pulse Gen Model: 2210

## 2016-05-07 NOTE — Progress Notes (Signed)
PCP:  Renato Shin, MD  The patient presents today for electrophysiology followup.  H/o  diastolic dysfunction. He struggles with sequelae from his morbid obesity.  He has chronic venous insufficiency and unsteadiness chronically and walks with a walker.  He is maintaining sinus with amiodarone.  Hearing is worse than in the past.  Today, he denies symptoms of chest pain, orthopnea, PND,  presyncope, syncope, or neurologic sequela.  The patient feels that he is tolerating medications without difficulties and is otherwise without complaint today.   Past Medical History  Diagnosis Date  . Diabetes mellitus   . COPD (chronic obstructive pulmonary disease)     CPAP  . Renal insufficiency   . Hyperlipemia   . Hypothyroidism   . Hyperkalemia   . 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 Sick sinus syndrome     s/p PPM by JA  . Pancreatitis   . Morbid obesity   . OSA on CPAP     using CPAP although it is hard for him to tolerate  . CAD (coronary artery disease)   . Hypertensive cardiovascular-renal disease   . Depression   . Anxiety   . H/O Legionnaire's disease 2003  . History of blood clots     R groin  . Peripheral neuropathy   . Osteoarthritis     fingers  . GERD (gastroesophageal reflux disease)   . Anemia     hx of  . Pneumonia 2003  . Cancer 2014    bladder cancer AND RIGHT URETERAL CANCER  . Diastolic dysfunction with chronic heart failure   . Persistent atrial fibrillation   . Atypical atrial flutter   . Venous insufficiency   . Complete heart block     cardioverted from A.Fib,hx. Sick sinus syndrome -Pacemaker implanted  . Pacemaker     implanted St. Jude 7'11  . CHF (congestive heart failure)   . Gait difficulty     slow gait"ambulates with walker"   Past Surgical History  Procedure Laterality Date  . Nasal septum surgery  1967  . Pacemaker placement  06/2010    [facility] Accent RF DR, Model [id number] ( Serial number [id number])  .  Thromboembolectomy and four compartment fasciotomy  2009    GROIN AREA  . Cataract extraction Right   . Cholecystectomy  2012  . Insert / replace / remove pacemaker  2011  . Transurethral resection of bladder tumor with gyrus (turbt-gyrus) N/A 10/26/2013    Procedure: TRANSURETHRAL RESECTION OF BLADDER TUMOR WITH GYRUS (TURBT-GYRUS);  Surgeon: Alexis Frock, MD;  [facility];  Service: Urology;  Laterality: N/A;  . Cystoscopy w/ ureteral stent placement Right 10/26/2013    Procedure: CYSTOSCOPY WITH RETROGRADE PYELOGRAM/URETERAL STENT PLACEMENT;  Surgeon: Alexis Frock, MD;  [facility];  Service: Urology;  Laterality: Right;  . Embolectomy Left 11/02/2013    Procedure: LEFT FEMORAL EMBOLECTOMY, LEFT FEMORAL ARTERY ENDARTERECTOMY WITH DACRON PATCH ANGIOPLASTY.;  Surgeon: Mal Misty, MD;  Location: [place];  Service: Vascular;  Laterality: Left;  . Transurethral resection of bladder tumor with gyrus (turbt-gyrus) N/A 01/11/2014    Procedure: TRANSURETHRAL RESECTION OF BLADDER TUMOR WITH GYRUS (TURBT-GYRUS);  Surgeon: Alexis Frock, MD;  [facility];  Service: Urology;  Laterality: N/A;  . Cystoscopy with ureteroscopy and stent placement Right 01/11/2014    Procedure: CYSTOSCOPY WITH URETEROSCOPY ,RIGHT RETROGRADE AND STENT CHANGE AND LASER OF URETERAL TUMOR;  Surgeon: Alexis Frock, MD;  [facility];  Service: Urology;  Laterality: Right;  . Holmium laser application Right [id number]    Procedure: HOLMIUM LASER APPLICATION;  Surgeon: Alexis Frock, MD;  [facility];  Service: Urology;  Laterality: Right;  . Tee without cardioversion N/A 05/16/2014    Procedure: TRANSESOPHAGEAL ECHOCARDIOGRAM (TEE);  Surgeon: Josue Hector, MD;  Location: [facility] ENDOSCOPY;  Service: Cardiovascular;  Laterality: N/A;  . Cardioversion N/A 05/16/2014    Procedure: CARDIOVERSION;  Surgeon: Josue Hector, MD;  Location: [facility] ENDOSCOPY;  Service: Cardiovascular;  Laterality: N/A;  . Cardiac  catheterization      11-02-13  . Cystoscopy/retrograde/ureteroscopy Right 08/23/2014    Procedure: CYSTOSCOPY/RETROGRADE/ DIAGNOSTIC URETEROSCOPY/RIGHT RENAL STONE EXTRACTION;  Surgeon: Alexis Frock, MD;  [facility];  Service: Urology;  Laterality: Right;  . Cardioversion N/A 11/08/2014    Procedure: CARDIOVERSION;  Surgeon: Fay Records, MD;  Location: [place];  Service: Cardiovascular;  Laterality: N/A;     Current outpatient prescriptions:  .  ACCU-CHEK AVIVA PLUS test strip, USE 3 TIMES PER DAY. , Disp: 300 each, Rfl: 2 .  acetaminophen (TYLENOL) 500 MG tablet, Take 500-1,000 mg by mouth 2 (two) times daily as needed for moderate pain. , Disp: , Rfl:  .  albuterol (PROVENTIL HFA;VENTOLIN HFA) 108 (90 BASE) MCG/ACT inhaler, Inhale 2 puffs into the lungs every 6 (six) hours as needed for wheezing or shortness of breath., Disp: 1 Inhaler, Rfl: 6 .  ALPRAZolam (XANAX) 0.25 MG tablet, TAKE 1 TABLET BY MOUTH 3 TIMES A DAY AS NEEDED FOR ANXIETY OR SLEEP, Disp: 50 tablet, Rfl: 2 .  amiodarone (PACERONE) 200 MG tablet, Take 1 tablet (200 mg total) by mouth daily., Disp: 60 tablet, Rfl: 6 .  apixaban (ELIQUIS) 5 MG TABS tablet, Take 1 tablet (5 mg total) by mouth 2 (two) times daily., Disp: 180 tablet, Rfl: 3 .  Ascorbic Acid (VITAMIN C) 500 MG tablet, Take 500 mg by mouth daily. , Disp: , Rfl:  .  brimonidine (ALPHAGAN P) 0.1 % SOLN, Place 1 drop into the left eye 3 (three) times daily. , Disp: , Rfl:  .  calcium carbonate (TUMS - DOSED IN MG ELEMENTAL CALCIUM) 500 MG chewable tablet, Chew 2 tablets by mouth 2 (two) times daily as needed for indigestion or heartburn., Disp: , Rfl:  .  cholecalciferol (VITAMIN D) 1000 UNITS tablet, Take 1,000 Units by mouth daily., Disp: , Rfl:  .  dextromethorphan (DELSYM) 30 MG/5ML liquid, Take 90 mg by mouth 2 (two) times daily as needed for cough., Disp: , Rfl:  .  docusate sodium (COLACE) 100 MG capsule, Take 100 mg by mouth daily as needed for mild  constipation., Disp: , Rfl:  .  dorzolamide-timolol (COSOPT) 22.3-6.8 MG/ML ophthalmic solution, Place 1 drop into both eyes 2 (two) times daily. , Disp: , Rfl:  .  fluticasone (FLONASE) 50 MCG/ACT nasal spray, USE 2 SPRAYS IN EACN NOSTRIL DAILY, Disp: 16 g, Rfl: 3 .  Fluticasone-Salmeterol (ADVAIR) 100-50 MCG/DOSE AEPB, Inhale 1 puff into the lungs 2 (two) times daily., Disp: 1 each, Rfl: 11 .  furosemide (LASIX) 40 MG tablet, TAKE 1 TABLET BY MOUTH IN THE MORNING AND 1/2 TABLET IN THE EVENING, Disp: , Rfl:  .  guaifenesin (HUMIBID E) 400 MG TABS tablet, Take 400 mg by mouth 2 (two) times daily as needed (for cough or congestion)., Disp: , Rfl:  .  insulin regular (NOVOLIN R) 100 units/mL injection, 3 times a day (just before each meal) 325-250-180, and  125 units with your bedtime snack.  And syringes 11/day, Disp: 880 mL, Rfl: 3 .  Insulin Syringe-Needle U-100 30G X 5/16" 1 ML MISC, Use to inject insulin. (500 unit syringe), Disp: 500 each, Rfl: 2 .  Lactobacillus (ACIDOPHILUS) CAPS capsule, Take 1 capsule by mouth daily., Disp: , Rfl:  .  latanoprost (XALATAN) 0.005 % ophthalmic solution, Place 1 drop into the left eye at bedtime. , Disp: , Rfl:  .  levothyroxine (SYNTHROID, LEVOTHROID) 75 MCG tablet, Take 1 tablet (75 mcg total) by mouth daily before breakfast., Disp: 90 tablet, Rfl: 3 .  metoprolol tartrate (LOPRESSOR) 25 MG tablet, Take 1 tablet (25 mg total) by mouth 3 (three) times daily., Disp: 270 tablet, Rfl: 3 .  Multiple Vitamin (MULTIVITAMIN WITH MINERALS) TABS tablet, Take 1 tablet by mouth daily., Disp: , Rfl:  .  Omega-3 Fatty Acids (FISH OIL) 1000 MG CAPS, Take 1,000 mg by mouth every morning. , Disp: , Rfl:  .  omeprazole (PRILOSEC) 40 MG capsule, TAKE 2 TABLETS BY MOUTH TWICE A DAY AS NEEDED FOR HEARTBURN OR ACID REFLUX, Disp: , Rfl:  .  rosuvastatin (CRESTOR) 40 MG tablet, Take 20 mg by mouth every evening. (1/2) tablet, Disp: , Rfl:  .  traMADol (ULTRAM) 50 MG tablet, TAKE 2  TABLETS BY MOUTH TWICE A DAY AS NEEDED FOR PAIN, Disp: , Rfl:  .  allopurinol (ZYLOPRIM) 100 MG tablet, Take 1 tablet (100 mg total) by mouth daily. (Patient not taking: Reported on 05/07/2016), Disp: 90 tablet, Rfl: 3     Allergies  Allergen Reactions  . Actos [Pioglitazone] Swelling    History   Social History  . Marital Status: Married    Spouse Name: N/A  . Number of Children: N/A  . 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 Smoking status: Former Smoker -- 2.00 packs/day for 50 years    Types: Cigarettes    Quit date: 12/08/2006  . Smokeless tobacco: Never Used  . Alcohol Use: No     Comment: quit 3 years ago  . Drug Use: No  . Sexual Activity: Not Currently   Other Topics Concern  . Not on file   Social History Narrative   Family History  Problem Relation Age of Onset  . Liver cancer Mother     deceased age 109  . Cancer Mother     liver cancer  . Colon cancer Neg Hx   . Heart attack Father     ROS-  All systems are reviewed and are negative except as outlined in the HPI above  Physical Exam:                   Filed Vitals:   05/07/16 1428  BP: 102/64  Pulse: 72   GEN- The patient is morbidly obese and chronically ill appearing, alert and oriented x 3 today.   Head- normocephalic, atraumatic Eyes-  Sclera clear, conjunctiva pink Ears- poor hearing (wears hearing aids) seems worse today Oropharynx- clear with very dry MM Neck- supple,  Flat JVP Lungs- Clear to ausculation bilaterally, normal work of breathing Heart- irregular rate and rhythm  GI- soft, NT, ND, + BS Extremities- no clubbing, cyanosis, + venous stasis changes with chronic edema MS- walks very slowly with a walker, very debilitated Skin- no rash or lesion Psych- euthymic mood, full affect Neuro- very unsteady today Pacemaker pocket is  well healed  Pacemaker interrogation is reviewed today (see  paceart)  Assessment and Plan:  1. Persistent afib/ atypical atrial flutter On amiodarone,  Maintaining sinus rhythm Recent lfts, tfts reviewed  Continue apixaban   2. Chronic diastolic dysfunction Stable No change required today  3. Sick sinus syndrome/ complete heart block Normal pacemaker function See Pace Art report No changes today  4. Chronic venous stasis changes His BLE edema is primarily due to obesity.  Weight loss is encouraged.  Support hose may help  5. Morbid obesity Stable No change required today No real progress made  6. OSA Compliance with CPAP is encouraged  7. Hypertensive cardio-renal disease Continue aggressive management Watch closely  merlin F/u with Truitt Merle every 6 months I will see when needed He may benefit from general cardiology referral ultimately  Thompson Grayer MD, [facility] 05/07/2016 3:00 PM

## 2016-05-07 NOTE — Patient Instructions (Addendum)
Medication Instructions:  Your physician recommends that you continue on your current medications as directed. Please refer to the Current Medication list given to you today.  Labwork: None  ordered  Testing/Procedures: None ordered  Follow-Up: Remote monitoring is used to monitor your Pacemaker of ICD from home. This monitoring reduces the number of office visits required to check your device to one time per year. It allows [place] to keep an eye on the functioning of your device to ensure it is working properly. You are scheduled for a device check from home on 08/06/2016. You may send your transmission at any time that day. If you have a wireless device, the transmission will be sent automatically. After your physician reviews your transmission, you will receive a postcard with your next transmission date.  Your physician wants you to follow-up in: 6 months with Truitt Merle, NP.  You will receive a reminder letter in the mail two months in advance. If you don't receive a letter, please call our office to schedule the follow-up appointment.   If you need a refill on your cardiac medications before your next appointment, please call your pharmacy.  Thank you for choosing [facility]!!     Any Other Special Instructions Will Be Listed Below (If Applicable).

## 2016-05-09 ENCOUNTER — Encounter: Payer: Self-pay | Admitting: Endocrinology

## 2016-05-23 ENCOUNTER — Telehealth: Payer: Self-pay | Admitting: Endocrinology

## 2016-05-23 NOTE — Telephone Encounter (Signed)
I contacted the pt and advised of note below. Pt voiced understanding and wanted you to be advised his currently taking 900 units of Novolin R daily.

## 2016-05-23 NOTE — Telephone Encounter (Signed)
please call patient: Please tell pt we got this message on 05/09/16, and gave this response.   Ok, please continue the same. i'll see you next time.       Previous Messages     ----- Message -----   From: [name]   Sent: 05/09/2016 2:23 PM EDT    To: Renato Shin, MD  Subject: Non-Urgent Medical Question   Since changing my daily insulin dosage from 900 units to 880 units I have been seeing more blood sugar readings over 200. Effective 05/08/16 I went back to injecting 900 units per day (325-250-200-125).

## 2016-06-12 DIAGNOSIS — C670 Malignant neoplasm of trigone of bladder: Secondary | ICD-10-CM | POA: Diagnosis not present

## 2016-06-12 DIAGNOSIS — C661 Malignant neoplasm of right ureter: Secondary | ICD-10-CM | POA: Diagnosis not present

## 2016-06-12 DIAGNOSIS — C679 Malignant neoplasm of bladder, unspecified: Secondary | ICD-10-CM | POA: Diagnosis not present

## 2016-06-16 DIAGNOSIS — N200 Calculus of kidney: Secondary | ICD-10-CM | POA: Diagnosis not present

## 2016-06-16 DIAGNOSIS — C669 Malignant neoplasm of unspecified ureter: Secondary | ICD-10-CM | POA: Diagnosis not present

## 2016-06-16 DIAGNOSIS — N3020 Other chronic cystitis without hematuria: Secondary | ICD-10-CM | POA: Diagnosis not present

## 2016-06-16 DIAGNOSIS — C670 Malignant neoplasm of trigone of bladder: Secondary | ICD-10-CM | POA: Diagnosis not present

## 2016-06-23 DIAGNOSIS — H401113 Primary open-angle glaucoma, right eye, severe stage: Secondary | ICD-10-CM | POA: Diagnosis not present

## 2016-06-23 DIAGNOSIS — H401123 Primary open-angle glaucoma, left eye, severe stage: Secondary | ICD-10-CM | POA: Diagnosis not present

## 2016-07-30 ENCOUNTER — Encounter: Payer: Self-pay | Admitting: Endocrinology

## 2016-07-30 ENCOUNTER — Ambulatory Visit (INDEPENDENT_AMBULATORY_CARE_PROVIDER_SITE_OTHER): Payer: Commercial Managed Care - HMO | Admitting: Endocrinology

## 2016-07-30 DIAGNOSIS — N183 Chronic kidney disease, stage 3 (moderate): Secondary | ICD-10-CM | POA: Diagnosis not present

## 2016-07-30 DIAGNOSIS — Z794 Long term (current) use of insulin: Secondary | ICD-10-CM

## 2016-07-30 DIAGNOSIS — E7800 Pure hypercholesterolemia, unspecified: Secondary | ICD-10-CM | POA: Diagnosis not present

## 2016-07-30 DIAGNOSIS — E1122 Type 2 diabetes mellitus with diabetic chronic kidney disease: Secondary | ICD-10-CM

## 2016-07-30 DIAGNOSIS — R0600 Dyspnea, unspecified: Secondary | ICD-10-CM

## 2016-07-30 DIAGNOSIS — D509 Iron deficiency anemia, unspecified: Secondary | ICD-10-CM | POA: Diagnosis not present

## 2016-07-30 LAB — POCT GLYCOSYLATED HEMOGLOBIN (HGB A1C): HEMOGLOBIN A1C: 7.5

## 2016-07-30 MED ORDER — INSULIN REGULAR HUMAN 100 UNIT/ML IJ SOLN: INTRAMUSCULAR | 3 refills | Status: DC

## 2016-07-30 NOTE — Progress Notes (Signed)
Subjective:    Patient ID: [name], male    DOB: 01-Nov-1941, 75 y.o.   MRN: [id number]  HPI Pt returns for f/u of diabetes mellitus: DM type: Insulin-requiring type 2 Dx'ed: [id number] Complications: renal insufficiency, polyneuropathy, and CAD.   Therapy: insulin since 1989.  DKA: never.   Severe hypoglycemia: never.   Pancreatitis: several episodes, most recently in 2013.  Other: he brings a record of his cbg's which i have reviewed today.  It varies from 45-334. It varies widely throughout the day, but is still lowest at hs.  Due to hyperglycemia, he re-increased the insulin to 4 times a day (just before each meal), 320-250-200-130. He has mild hypoglycemia approx twice per month.  pt states he feels well in general, except for doe.  Past Medical History:  Diagnosis Date  . Anemia    hx of  . Anxiety   . Atypical atrial flutter ([place])   . CAD (coronary artery disease)   . Cancer [facility]) 2014   bladder cancer AND RIGHT URETERAL CANCER  . CHF (congestive heart failure) ([place])   . Complete heart block (HCC)    cardioverted from A.Fib,hx. Sick sinus syndrome -Pacemaker implanted  . COPD (chronic obstructive pulmonary disease) (HCC)    CPAP  . Depression   . Diabetes mellitus    TypeII  . Diastolic dysfunction with chronic heart failure ([place])   . Gait difficulty    slow gait"ambulates with walker"  . GERD (gastroesophageal reflux disease)   . 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 H/O Legionnaire's disease 2003  . History of blood clots    R groin  . History of kidney stones   . Hyperkalemia   . Hyperlipemia   . Hypertensive cardiovascular-renal disease   . Hypothyroidism   . Morbid obesity ([name])   . OSA on CPAP    using CPAP although it is hard for him to tolerate  . Osteoarthritis    fingers  . Pacemaker    implanted [employer] Jude 7'11  . Pancreatitis   . Peripheral neuropathy ([place])   . Persistent atrial fibrillation ([place])   . Pneumonia 2003  . Renal  insufficiency   . Shortness of breath dyspnea    with exertion  . Sick sinus syndrome ([place])    s/p PPM by JA  . Venous insufficiency     Past Surgical History:  Procedure Laterality Date  . BELPHAROPTOSIS REPAIR Right    Glaucoma  . CARDIAC CATHETERIZATION     11-02-13  . CARDIOVERSION N/A 05/16/2014   Procedure: CARDIOVERSION;  Surgeon: Josue Hector, MD;  Location: [facility] ENDOSCOPY;  Service: Cardiovascular;  Laterality: N/A;  . CARDIOVERSION N/A 11/08/2014   Procedure: CARDIOVERSION;  Surgeon: Fay Records, MD;  Location: [place];  Service: Cardiovascular;  Laterality: N/A;  . CARDIOVERSION N/A 06/22/2015   Procedure: CARDIOVERSION;  Surgeon: Jerline Pain, MD;  Location: [place];  Service: Cardiovascular;  Laterality: N/A;  . CHOLECYSTECTOMY  2012  . CYSTOSCOPY W/ URETERAL STENT PLACEMENT Right 10/26/2013   Procedure: CYSTOSCOPY WITH RETROGRADE PYELOGRAM/URETERAL STENT PLACEMENT;  Surgeon: Alexis Frock, MD;  Location: [facility];  Service: Urology;  Laterality: Right;  . CYSTOSCOPY WITH URETEROSCOPY AND STENT PLACEMENT Right 01/11/2014   Procedure: CYSTOSCOPY WITH URETEROSCOPY ,RIGHT RETROGRADE AND STENT CHANGE AND LASER OF URETERAL TUMOR;  Surgeon: Alexis Frock, MD;  Location: [facility];  Service: Urology;  Laterality: Right;  . CYSTOSCOPY/RETROGRADE/URETEROSCOPY Right 08/23/2014  Procedure: CYSTOSCOPY/RETROGRADE/ DIAGNOSTIC URETEROSCOPY/RIGHT RENAL STONE EXTRACTION;  Surgeon: Alexis Frock, MD;  Location: [facility];  Service: Urology;  Laterality: Right;  . EMBOLECTOMY Left 11/02/2013   Procedure: LEFT FEMORAL EMBOLECTOMY, LEFT FEMORAL ARTERY ENDARTERECTOMY WITH DACRON PATCH ANGIOPLASTY.;  Surgeon: Mal Misty, MD;  Location: [place];  Service: Vascular;  Laterality: Left;  . EYE SURGERY Left    cataract  . GROIN DEBRIDEMENT Right 05/08/2015   Procedure: REMOVAL OF RIGHT GROIN MASS;  Surgeon: Angelia Mould, MD;  Location: [place];  Service: Vascular;  Laterality: Right;  .  HOLMIUM LASER APPLICATION Right [id number]   Procedure: HOLMIUM LASER APPLICATION;  Surgeon: Alexis Frock, MD;  Location: [facility];  Service: Urology;  Laterality: Right;  . INSERT / REPLACE / REMOVE PACEMAKER  2011  . NASAL SEPTUM SURGERY  1967  . PACEMAKER PLACEMENT  06/2010   [facility] Accent RF DR, Model [id number] ( Serial number [id number])  . TEE WITHOUT CARDIOVERSION N/A 05/16/2014   Procedure: TRANSESOPHAGEAL ECHOCARDIOGRAM (TEE);  Surgeon: Josue Hector, MD;  Location: [facility] ENDOSCOPY;  Service: Cardiovascular;  Laterality: N/A;  . thromboembolectomy and four compartment fasciotomy Right 2009   leg  . TRANSURETHRAL RESECTION OF BLADDER TUMOR WITH GYRUS (TURBT-GYRUS) N/A 10/26/2013   Procedure: TRANSURETHRAL RESECTION OF BLADDER TUMOR WITH GYRUS (TURBT-GYRUS);  Surgeon: Alexis Frock, MD;  Location[facility];  Service: Urology;  Laterality: N/A;  . TRANSURETHRAL RESECTION OF BLADDER TUMOR WITH GYRUS (TURBT-GYRUS) N/A 01/11/2014   Procedure: TRANSURETHRAL RESECTION OF BLADDER TUMOR WITH GYRUS (TURBT-GYRUS);  Surgeon: Alexis Frock, MD;  Location[facility];  Service: Urology;  Laterality: N/A;  . WISDOM TOOTH EXTRACTION      Social History   Social History  . Marital status: Married    Spouse name: N/A  . Number of children: N/A  . Years of education: N/A   Occupational History  . 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 Smoking status: Former Smoker    Packs/day: 2.00    Years: 50.00    Types: Cigarettes    Quit date: 12/08/2006  . Smokeless tobacco: Never Used  . Alcohol use No     Comment: quit 3 years ago  . Drug use: No  . Sexual activity: Not Currently   Other Topics Concern  . Not on file   Social History Narrative  . No narrative on file    Current Outpatient Prescriptions on File Prior to Visit  Medication Sig Dispense Refill  . ACCU-CHEK AVIVA PLUS test strip USE 3 TIMES PER DAY.  300 each 2  . acetaminophen (TYLENOL) 500 MG tablet  Take 500-1,000 mg by mouth 2 (two) times daily as needed for moderate pain.     Marland Kitchen albuterol (PROVENTIL HFA;VENTOLIN HFA) 108 (90 BASE) MCG/ACT inhaler Inhale 2 puffs into the lungs every 6 (six) hours as needed for wheezing or shortness of breath. 1 Inhaler 6  . allopurinol (ZYLOPRIM) 100 MG tablet Take 1 tablet (100 mg total) by mouth daily. 90 tablet 3  . ALPRAZolam (XANAX) 0.25 MG tablet TAKE 1 TABLET BY MOUTH 3 TIMES A DAY AS NEEDED FOR ANXIETY OR SLEEP 50 tablet 2  . amiodarone (PACERONE) 200 MG tablet Take 1 tablet (200 mg total) by mouth daily. 60 tablet 6  . apixaban (ELIQUIS) 5 MG TABS tablet Take 1 tablet (5 mg total) by mouth 2 (two) times daily. 180 tablet 3  . Ascorbic Acid (VITAMIN C) 500 MG  tablet Take 500 mg by mouth daily.     . brimonidine (ALPHAGAN P) 0.1 % SOLN Place 1 drop into the left eye 3 (three) times daily.     . calcium carbonate (TUMS - DOSED IN MG ELEMENTAL CALCIUM) 500 MG chewable tablet Chew 2 tablets by mouth 2 (two) times daily as needed for indigestion or heartburn.    . cholecalciferol (VITAMIN D) 1000 UNITS tablet Take 1,000 Units by mouth daily.    Marland Kitchen dextromethorphan (DELSYM) 30 MG/5ML liquid Take 90 mg by mouth 2 (two) times daily as needed for cough.    . docusate sodium (COLACE) 100 MG capsule Take 100 mg by mouth daily as needed for mild constipation.    . dorzolamide-timolol (COSOPT) 22.3-6.8 MG/ML ophthalmic solution Place 1 drop into both eyes 2 (two) times daily.     . fluticasone (FLONASE) 50 MCG/ACT nasal spray USE 2 SPRAYS IN EACN NOSTRIL DAILY 16 g 3  . Fluticasone-Salmeterol (ADVAIR) 100-50 MCG/DOSE AEPB Inhale 1 puff into the lungs 2 (two) times daily. 1 each 11  . furosemide (LASIX) 40 MG tablet TAKE 1 TABLET BY MOUTH IN THE MORNING AND 1/2 TABLET IN THE EVENING    . guaifenesin (HUMIBID E) 400 MG TABS tablet Take 400 mg by mouth 2 (two) times daily as needed (for cough or congestion).    . Insulin Syringe-Needle U-100 30G X 5/16" 1 ML MISC Use to  inject insulin. (500 unit syringe) 500 each 2  . Lactobacillus (ACIDOPHILUS) CAPS capsule Take 1 capsule by mouth daily.    Marland Kitchen latanoprost (XALATAN) 0.005 % ophthalmic solution Place 1 drop into the left eye at bedtime.     Marland Kitchen levothyroxine (SYNTHROID, LEVOTHROID) 75 MCG tablet Take 1 tablet (75 mcg total) by mouth daily before breakfast. 90 tablet 3  . metoprolol tartrate (LOPRESSOR) 25 MG tablet Take 1 tablet (25 mg total) by mouth 3 (three) times daily. 270 tablet 3  . Multiple Vitamin (MULTIVITAMIN WITH MINERALS) TABS tablet Take 1 tablet by mouth daily.    . Omega-3 Fatty Acids (FISH OIL) 1000 MG CAPS Take 1,000 mg by mouth every morning.     Marland Kitchen omeprazole (PRILOSEC) 40 MG capsule TAKE 2 TABLETS BY MOUTH TWICE A DAY AS NEEDED FOR HEARTBURN OR ACID REFLUX    . rosuvastatin (CRESTOR) 40 MG tablet Take 20 mg by mouth every evening. (1/2) tablet    . traMADol (ULTRAM) 50 MG tablet TAKE 2 TABLETS BY MOUTH TWICE A DAY AS NEEDED FOR PAIN     No current facility-administered medications on file prior to visit.     Allergies  Allergen Reactions  . Actos [Pioglitazone] Swelling  . Timolol Other (See Comments)    Slow heart rate but tolerates Cosopt (dorzolamide-timolol)    Family History  Problem Relation Age of Onset  . Liver cancer Mother     deceased age 73  . Cancer Mother     liver cancer  . Colon cancer Neg Hx   . Heart attack Father     BP 124/60   Pulse 70   Ht 5\' 10"  (1.778 m)   Wt (!) 312 lb (141.5 kg)   SpO2 94%   BMI 44.77 kg/m   Review of Systems Denies LOC    Objective:   Physical Exam VITAL SIGNS:  See vs page. GENERAL: no distress. CV: dorsalis pedis pulses intact bilat.  EXTEMITIES: no deformity. 2+ bilat leg edema. . There is bilateral onychomycosis.  Neuro: sensation is intact to  touch on the feet, but decreased from normal.  Pulses: dorsalis pedis intact bilat. Skin: normal color and temp on the feet, but the skin is dry. No ulcer. Several healed  surgical scars at the right leg. There is very dry skin andhyperpigmentation of the legs.     A1c=7.5%    Assessment & Plan:  Insulin-requiring type 2 DM: slightly worse Hypoglycemia: in view of CAD, he should decrease, but he declines.

## 2016-07-30 NOTE — Patient Instructions (Addendum)
Please take the insulin, 3 times a day (just before each meal) 320-250-200, and 130 units with your bedtime snack.   check your blood sugar twice a day.  vary the time of day when you check, between before the 3 meals, and at bedtime.  also check if you have symptoms of your blood sugar being too high or too low.  please keep a record of the readings and bring it to your next appointment here (or you can bring the meter itself).  You can write it on any piece of paper.  please call us sooner if your blood sugar goes below 70, or if you have a lot of readings over 200.  Please come back for a follow-up appointment in 3 months.

## 2016-08-06 ENCOUNTER — Ambulatory Visit (INDEPENDENT_AMBULATORY_CARE_PROVIDER_SITE_OTHER): Payer: Commercial Managed Care - HMO | Admitting: *Deleted

## 2016-08-06 DIAGNOSIS — Z950 Presence of cardiac pacemaker: Secondary | ICD-10-CM

## 2016-08-06 DIAGNOSIS — I495 Sick sinus syndrome: Secondary | ICD-10-CM

## 2016-08-06 NOTE — Progress Notes (Signed)
Remote pacemaker transmission.   

## 2016-08-08 ENCOUNTER — Encounter: Payer: Self-pay | Admitting: Cardiology

## 2016-08-22 ENCOUNTER — Encounter: Payer: Self-pay | Admitting: Cardiology

## 2016-08-22 LAB — CUP PACEART REMOTE DEVICE CHECK
Battery Remaining Percentage: 57 %
Battery Voltage: 2.89 V
Brady Statistic AS VP Percent: 1 %
Brady Statistic AS VS Percent: 1 %
Implantable Lead Implant Date: 20110705
Implantable Lead Location: 753860
Lead Channel Pacing Threshold Amplitude: 0.875 V
Lead Channel Pacing Threshold Pulse Width: 0.5 ms
Lead Channel Sensing Intrinsic Amplitude: 3.6 mV
Lead Channel Setting Pacing Amplitude: 1 V
MDC IDC LEAD IMPLANT DT: 20110705
MDC IDC LEAD LOCATION: 753859
MDC IDC MSMT BATTERY REMAINING LONGEVITY: 58 mo
MDC IDC MSMT LEADCHNL RA IMPEDANCE VALUE: 380 Ohm
MDC IDC MSMT LEADCHNL RA PACING THRESHOLD PULSEWIDTH: 0.5 ms
MDC IDC MSMT LEADCHNL RV IMPEDANCE VALUE: 380 Ohm
MDC IDC MSMT LEADCHNL RV PACING THRESHOLD AMPLITUDE: 0.75 V
MDC IDC MSMT LEADCHNL RV SENSING INTR AMPL: 9.3 mV
MDC IDC PG SERIAL: 7152830
MDC IDC SESS DTM: 20170830074822
MDC IDC SET LEADCHNL RA PACING AMPLITUDE: 1.875
MDC IDC SET LEADCHNL RV PACING PULSEWIDTH: 0.5 ms
MDC IDC SET LEADCHNL RV SENSING SENSITIVITY: 4 mV
MDC IDC STAT BRADY AP VP PERCENT: 99 %
MDC IDC STAT BRADY AP VS PERCENT: 1 %
MDC IDC STAT BRADY RA PERCENT PACED: 99 %
MDC IDC STAT BRADY RV PERCENT PACED: 99 %

## 2016-08-26 ENCOUNTER — Other Ambulatory Visit: Payer: Self-pay | Admitting: Endocrinology

## 2016-08-27 ENCOUNTER — Other Ambulatory Visit: Payer: Self-pay | Admitting: Endocrinology

## 2016-09-12 ENCOUNTER — Other Ambulatory Visit: Payer: Self-pay | Admitting: Endocrinology

## 2016-09-17 ENCOUNTER — Other Ambulatory Visit: Payer: Self-pay | Admitting: Endocrinology

## 2016-09-27 DIAGNOSIS — R040 Epistaxis: Secondary | ICD-10-CM | POA: Diagnosis not present

## 2016-10-14 ENCOUNTER — Telehealth: Payer: Self-pay | Admitting: Endocrinology

## 2016-10-14 DIAGNOSIS — Z794 Long term (current) use of insulin: Principal | ICD-10-CM

## 2016-10-14 DIAGNOSIS — N183 Chronic kidney disease, stage 3 (moderate): Principal | ICD-10-CM

## 2016-10-14 DIAGNOSIS — E1122 Type 2 diabetes mellitus with diabetic chronic kidney disease: Secondary | ICD-10-CM

## 2016-10-14 NOTE — Telephone Encounter (Signed)
done

## 2016-10-14 NOTE — Telephone Encounter (Signed)
Pt called and needs a referral to see Dr. Marshall Cork at [facility] before 10/21/16

## 2016-10-14 NOTE — Telephone Encounter (Signed)
See message and please advise, Thanks!  

## 2016-10-17 ENCOUNTER — Telehealth: Payer: Self-pay | Admitting: Endocrinology

## 2016-10-17 DIAGNOSIS — E1122 Type 2 diabetes mellitus with diabetic chronic kidney disease: Secondary | ICD-10-CM

## 2016-10-17 DIAGNOSIS — N183 Chronic kidney disease, stage 3 unspecified: Secondary | ICD-10-CM

## 2016-10-17 DIAGNOSIS — Z794 Long term (current) use of insulin: Principal | ICD-10-CM

## 2016-10-17 NOTE — Telephone Encounter (Signed)
Pt called in about his Opthalmology Referral, the referral was sent to the wrong doctor. It needs to be sent to [facility] to see Dr. Kathlen Mody before Tuesday.

## 2016-10-17 NOTE — Telephone Encounter (Signed)
done

## 2016-10-20 NOTE — Telephone Encounter (Signed)
I contacted the patient and advised referral has been placed via voicemail. Requested a call back if the patient would like to discuss further.

## 2016-10-21 DIAGNOSIS — H401133 Primary open-angle glaucoma, bilateral, severe stage: Secondary | ICD-10-CM | POA: Diagnosis not present

## 2016-10-27 ENCOUNTER — Telehealth: Payer: Self-pay | Admitting: Endocrinology

## 2016-10-27 DIAGNOSIS — I4891 Unspecified atrial fibrillation: Secondary | ICD-10-CM

## 2016-10-27 NOTE — Telephone Encounter (Signed)
See message and please advise, Thanks!  

## 2016-10-27 NOTE — Telephone Encounter (Signed)
Pt says that he need a referral to see his cardiologist, Dr. Tomi Likens. Pt's appt is on Nov. 27,2017.    CB: [phone]

## 2016-10-27 NOTE — Telephone Encounter (Signed)
I contacted the patient and advised Dr. [name] has placed the referral. [name] please see message to be notified as well. This patient will also need a [employer] referral. Thanks!

## 2016-10-27 NOTE — Telephone Encounter (Signed)
done

## 2016-11-02 NOTE — Progress Notes (Signed)
Subjective:    Patient ID: [name], male    DOB: 1941/08/06, 75 y.o.   MRN: [id number]  HPI Pt returns for f/u of diabetes mellitus: DM type: Insulin-requiring type 2 Dx'ed: [id number] Complications: renal insufficiency, polyneuropathy, and CAD.   Therapy: insulin since 1989.  DKA: never.   Severe hypoglycemia: never.   Pancreatitis: several episodes, most recently in 2013.  Other: he brings a record of his cbg's which i have reviewed today.  It varies from 45-371. It varies widely throughout the day, but is still lowest at supper, and at HS.  pt states he feels well in general, except for doe. He takes reg insulin qac 300-260-220-140 units.   He has few years of prod-quality cough in the chest, and intermittent assoc wheezing.   He says advair did not help, but ventolin did.   Past Medical History:  Diagnosis Date  . Anemia    hx of  . Anxiety   . Atypical atrial flutter ([place])   . CAD (coronary artery disease)   . [facility]) 2014   bladder cancer AND RIGHT URETERAL CANCER  . CHF (congestive heart failure) ([place])   . Complete heart block (HCC)    cardioverted from A.Fib,hx. Sick sinus syndrome -Pacemaker implanted  . COPD (chronic obstructive pulmonary disease) (HCC)    CPAP  . Depression   . Diabetes mellitus    TypeII  . Diastolic dysfunction with chronic heart failure ([place])   . Gait difficulty    slow gait"ambulates with walker"  . GERD (gastroesophageal reflux disease)   . 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 H/O Legionnaire's disease 2003  . History of blood clots    R groin  . History of kidney stones   . Hyperkalemia   . Hyperlipemia   . Hypertensive cardiovascular-renal disease   . Hypothyroidism   . Morbid obesity ([place])   . OSA on CPAP    using CPAP although it is hard for him to tolerate  . Osteoarthritis    fingers  . Pacemaker    implanted [employer]. Jude 7'11  . Pancreatitis   . Peripheral neuropathy ([place])   . Persistent atrial fibrillation  ([place])   . Pneumonia 2003  . Renal insufficiency   . Shortness of breath dyspnea    with exertion  . Sick sinus syndrome ([place])    s/p PPM by JA  . Venous insufficiency     Past Surgical History:  Procedure Laterality Date  . BELPHAROPTOSIS REPAIR Right    Glaucoma  . CARDIAC CATHETERIZATION     11-02-13  . CARDIOVERSION N/A 05/16/2014   Procedure: CARDIOVERSION;  Surgeon: Josue Hector, MD;  Location[facility] ENDOSCOPY;  Service: Cardiovascular;  Laterality: N/A;  . CARDIOVERSION N/A 11/08/2014   Procedure: CARDIOVERSION;  Surgeon: Fay Records, MD;  Location: [place];  Service: Cardiovascular;  Laterality: N/A;  . CARDIOVERSION N/A 06/22/2015   Procedure: CARDIOVERSION;  Surgeon: Jerline Pain, MD;  Location: [place];  Service: Cardiovascular;  Laterality: N/A;  . CHOLECYSTECTOMY  2012  . CYSTOSCOPY W/ URETERAL STENT PLACEMENT Right 10/26/2013   Procedure: CYSTOSCOPY WITH RETROGRADE PYELOGRAM/URETERAL STENT PLACEMENT;  Surgeon: Alexis Frock, MD;  Location: [facility];  Service: Urology;  Laterality: Right;  . CYSTOSCOPY WITH URETEROSCOPY AND STENT PLACEMENT Right 01/11/2014   Procedure: CYSTOSCOPY WITH URETEROSCOPY ,RIGHT RETROGRADE AND STENT CHANGE AND LASER OF URETERAL TUMOR;  Surgeon: Alexis Frock, MD;  Location: [facility];  Service: Urology;  Laterality: Right;  . CYSTOSCOPY/RETROGRADE/URETEROSCOPY Right 08/23/2014   Procedure: CYSTOSCOPY/RETROGRADE/ DIAGNOSTIC URETEROSCOPY/RIGHT RENAL STONE EXTRACTION;  Surgeon: Alexis Frock, MD;  Location: [facility];  Service: Urology;  Laterality: Right;  . EMBOLECTOMY Left 11/02/2013   Procedure: LEFT FEMORAL EMBOLECTOMY, LEFT FEMORAL ARTERY ENDARTERECTOMY WITH DACRON PATCH ANGIOPLASTY.;  Surgeon: Mal Misty, MD;  Location: [place];  Service: Vascular;  Laterality: Left;  . EYE SURGERY Left    cataract  . GROIN DEBRIDEMENT Right 05/08/2015   Procedure: REMOVAL OF RIGHT GROIN MASS;  Surgeon: Angelia Mould, MD;  Location: [place];  Service:  Vascular;  Laterality: Right;  . HOLMIUM LASER APPLICATION Right [id number]   Procedure: HOLMIUM LASER APPLICATION;  Surgeon: Alexis Frock, MD;  Location: [facility];  Service: Urology;  Laterality: Right;  . INSERT / REPLACE / REMOVE PACEMAKER  2011  . NASAL SEPTUM SURGERY  1967  . PACEMAKER PLACEMENT  06/2010   [facility] Accent RF DR, Model [id number] ( Serial number [id number])  . TEE WITHOUT CARDIOVERSION N/A 05/16/2014   Procedure: TRANSESOPHAGEAL ECHOCARDIOGRAM (TEE);  Surgeon: Josue Hector, MD;  Location: [facility] ENDOSCOPY;  Service: Cardiovascular;  Laterality: N/A;  . thromboembolectomy and four compartment fasciotomy Right 2009   leg  . TRANSURETHRAL RESECTION OF BLADDER TUMOR WITH GYRUS (TURBT-GYRUS) N/A 10/26/2013   Procedure: TRANSURETHRAL RESECTION OF BLADDER TUMOR WITH GYRUS (TURBT-GYRUS);  Surgeon: Alexis Frock, MD;  Location: [facility];  Service: Urology;  Laterality: N/A;  . TRANSURETHRAL RESECTION OF BLADDER TUMOR WITH GYRUS (TURBT-GYRUS) N/A 01/11/2014   Procedure: TRANSURETHRAL RESECTION OF BLADDER TUMOR WITH GYRUS (TURBT-GYRUS);  Surgeon: Alexis Frock, MD;  Location: [facility];  Service: Urology;  Laterality: N/A;  . WISDOM TOOTH EXTRACTION      Social History   Social History  . Marital status: Married    Spouse name: N/A  . Number of children: N/A  . Years of education: N/A   Occupational History  . 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 Smoking status: Former Smoker    Packs/day: 2.00    Years: 50.00    Types: Cigarettes    Quit date: 12/08/2006  . Smokeless tobacco: Never Used  . Alcohol use No     Comment: quit 3 years ago  . Drug use: No  . Sexual activity: Not Currently   Other Topics Concern  . Not on file   Social History Narrative  . No narrative on file    Current Outpatient Prescriptions on File Prior to Visit  Medication Sig Dispense Refill  . ACCU-CHEK AVIVA PLUS test strip TEST THREE TIMES DAILY 300 each 1  .  acetaminophen (TYLENOL) 500 MG tablet Take 500-1,000 mg by mouth 2 (two) times daily as needed for moderate pain.     Marland Kitchen allopurinol (ZYLOPRIM) 100 MG tablet Take 1 tablet (100 mg total) by mouth daily. 90 tablet 3  . ALPRAZolam (XANAX) 0.25 MG tablet TAKE 1 TABLET BY MOUTH 3 TIMES A DAY AS NEEDED FOR ANXIETY OR SLEEP 50 tablet 2  . amiodarone (PACERONE) 200 MG tablet Take 1 tablet (200 mg total) by mouth daily. 60 tablet 6  . apixaban (ELIQUIS) 5 MG TABS tablet Take 1 tablet (5 mg total) by mouth 2 (two) times daily. 180 tablet 3  . Ascorbic Acid (VITAMIN C) 500 MG tablet Take 500 mg by mouth daily.     . brimonidine (ALPHAGAN P) 0.1 % SOLN Place 1 drop into  the left eye 3 (three) times daily.     . calcium carbonate (TUMS - DOSED IN MG ELEMENTAL CALCIUM) 500 MG chewable tablet Chew 2 tablets by mouth 2 (two) times daily as needed for indigestion or heartburn.    . cholecalciferol (VITAMIN D) 1000 UNITS tablet Take 1,000 Units by mouth daily.    Marland Kitchen dextromethorphan (DELSYM) 30 MG/5ML liquid Take 90 mg by mouth 2 (two) times daily as needed for cough.    . docusate sodium (COLACE) 100 MG capsule Take 100 mg by mouth daily as needed for mild constipation.    . dorzolamide-timolol (COSOPT) 22.3-6.8 MG/ML ophthalmic solution Place 1 drop into both eyes 2 (two) times daily.     . fluticasone (FLONASE) 50 MCG/ACT nasal spray USE 2 SPRAYS IN EACN NOSTRIL DAILY 16 g 3  . Fluticasone-Salmeterol (ADVAIR) 100-50 MCG/DOSE AEPB Inhale 1 puff into the lungs 2 (two) times daily. 1 each 11  . furosemide (LASIX) 40 MG tablet TAKE 1 TABLET BY MOUTH IN THE MORNING AND 1/2 TABLET IN THE EVENING    . guaifenesin (HUMIBID E) 400 MG TABS tablet Take 400 mg by mouth 2 (two) times daily as needed (for cough or congestion).    . insulin regular (NOVOLIN R) 100 units/mL injection 3 times a day (just before each meal) 320-250-200, and 1305 units with your bedtime snack.  And syringes 11/day 900 mL 3  . Insulin Syringe-Needle  U-100 30G X 5/16" 1 ML MISC Use to inject insulin. (500 unit syringe) 500 each 2  . Lactobacillus (ACIDOPHILUS) CAPS capsule Take 1 capsule by mouth daily.    Marland Kitchen levothyroxine (SYNTHROID, LEVOTHROID) 75 MCG tablet Take 1 tablet (75 mcg total) by mouth daily before breakfast. 90 tablet 3  . metoprolol tartrate (LOPRESSOR) 25 MG tablet Take 1 tablet (25 mg total) by mouth 3 (three) times daily. 270 tablet 3  . Multiple Vitamin (MULTIVITAMIN WITH MINERALS) TABS tablet Take 1 tablet by mouth daily.    . Omega-3 Fatty Acids (FISH OIL) 1000 MG CAPS Take 1,000 mg by mouth every morning.     Marland Kitchen omeprazole (PRILOSEC) 40 MG capsule TAKE 1 CAPSULE EVERY DAY 90 capsule 1  . rosuvastatin (CRESTOR) 40 MG tablet Take 20 mg by mouth every evening. (1/2) tablet    . traMADol (ULTRAM) 50 MG tablet TAKE 2 TABLETS BY MOUTH TWICE A DAY AS NEEDED 50 tablet 3  . travoprost, benzalkonium, (TRAVATAN) 0.004 % ophthalmic solution Place 1 drop into the left eye at bedtime.    . VENTOLIN HFA 108 (90 Base) MCG/ACT inhaler INHALE 2 PUFFS INTO THE LUNGS EVERY 6 (SIX) HOURS AS NEEDED FOR WHEEZING OR SHORTNESS OF BREATH. 18 Inhaler 1   No current facility-administered medications on file prior to visit.     Allergies  Allergen Reactions  . Actos [Pioglitazone] Swelling  . Timolol Other (See Comments)    Slow heart rate but tolerates Cosopt (dorzolamide-timolol)    Family History  Problem Relation Age of Onset  . Liver cancer Mother     deceased age 65  . Cancer Mother     liver cancer  . Colon cancer Neg Hx   . Heart attack Father     BP 122/62   Pulse 70   Ht 5' 10.5" (1.791 m)   Wt (!) 311 lb (141.1 kg)   SpO2 97%   BMI 43.99 kg/m    Review of Systems Chronic pruritis of the ears persits.      Objective:  Physical Exam VITAL SIGNS:  See vs page. GENERAL: no distress. CV: dorsalis pedis pulses intact bilat.  EXTEMITIES: no deformity. 2+ bilat leg edema. . There is bilateral onychomycosis.  Neuro:  sensation is intact to touch on the feet, but decreased from normal.  Pulses: dorsalis pedis absent bilat (prob due to edema). Skin: normal color and temp on the feet. No ulcer. Several healed surgical scars at the right leg. There is very dry skin andhyperpigmentation of the legs.    A1c=7.5%    Assessment & Plan:  Insulin-requiring type 2 DM, with renal insufficiency: this is the best control this pt should aim for, given variable cbg's.  Itching of eac's, new to me, uncertain etiology.  Cough, chronic: pt declines ref back to pump, for now.   Patient is advised the following: Patient Instructions  I have sent a prescription to your pharmacy, for the anti-itch cream. A chest x-ray is requested for you today.  We'll let you know about the results. Please continue the insulin, 3 times a day (just before each meal) 320-260-200, and 140 units with your bedtime snack.   check your blood sugar twice a day.  vary the time of day when you check, between before the 3 meals, and at bedtime.  also check if you have symptoms of your blood sugar being too high or too low.  please keep a record of the readings and bring it to your next appointment here (or you can bring the meter itself).  You can write it on any piece of paper.  please call us sooner if your blood sugar goes below 70, or if you have a lot of readings over 200.  Please come back for a follow-up appointment in 3 months.

## 2016-11-03 ENCOUNTER — Encounter: Payer: Self-pay | Admitting: Nurse Practitioner

## 2016-11-03 ENCOUNTER — Ambulatory Visit (INDEPENDENT_AMBULATORY_CARE_PROVIDER_SITE_OTHER): Payer: Commercial Managed Care - HMO | Admitting: Nurse Practitioner

## 2016-11-03 VITALS — BP 120/60 | HR 70 | Ht 70.0 in | Wt 311.8 lb

## 2016-11-03 DIAGNOSIS — I480 Paroxysmal atrial fibrillation: Secondary | ICD-10-CM | POA: Diagnosis not present

## 2016-11-03 DIAGNOSIS — Z79899 Other long term (current) drug therapy: Secondary | ICD-10-CM | POA: Diagnosis not present

## 2016-11-03 DIAGNOSIS — Z950 Presence of cardiac pacemaker: Secondary | ICD-10-CM

## 2016-11-03 DIAGNOSIS — I5032 Chronic diastolic (congestive) heart failure: Secondary | ICD-10-CM

## 2016-11-03 DIAGNOSIS — I495 Sick sinus syndrome: Secondary | ICD-10-CM | POA: Diagnosis not present

## 2016-11-03 LAB — BASIC METABOLIC PANEL
BUN: 37 mg/dL — ABNORMAL HIGH (ref 7–25)
CO2: 23 mmol/L (ref 20–31)
Calcium: 9 mg/dL (ref 8.6–10.3)
Chloride: 100 mmol/L (ref 98–110)
Creat: 1.74 mg/dL — ABNORMAL HIGH (ref 0.70–1.18)
Glucose, Bld: 344 mg/dL — ABNORMAL HIGH (ref 65–99)
Potassium: 4.9 mmol/L (ref 3.5–5.3)
Sodium: 136 mmol/L (ref 135–146)

## 2016-11-03 LAB — HEPATIC FUNCTION PANEL
ALT: 27 U/L (ref 9–46)
AST: 29 U/L (ref 10–35)
Albumin: 3.9 g/dL (ref 3.6–5.1)
Alkaline Phosphatase: 98 U/L (ref 40–115)
Bilirubin, Direct: 0.1 mg/dL (ref <=0.2)
Indirect Bilirubin: 0.3 mg/dL (ref 0.2–1.2)
Total Bilirubin: 0.4 mg/dL (ref 0.2–1.2)
Total Protein: 7.4 g/dL (ref 6.1–8.1)

## 2016-11-03 LAB — CBC
HCT: 41.8 % (ref 38.5–50.0)
Hemoglobin: 13.6 g/dL (ref 13.2–17.1)
MCH: 31.1 pg (ref 27.0–33.0)
MCHC: 32.5 g/dL (ref 32.0–36.0)
MCV: 95.7 fL (ref 80.0–100.0)
MPV: 10.1 fL (ref 7.5–12.5)
Platelets: 251 10*3/uL (ref 140–400)
RBC: 4.37 MIL/uL (ref 4.20–5.80)
RDW: 15.2 % — ABNORMAL HIGH (ref 11.0–15.0)
WBC: 8.1 10*3/uL (ref 3.8–10.8)

## 2016-11-03 NOTE — Patient Instructions (Addendum)
We will be checking the following labs today - BMET, CBC, HPF and TSH   Medication Instructions:    Continue with your current medicines.     Testing/Procedures To Be Arranged:  N/A  Follow-Up:   See Dr. Rayann Heman in 6 months    Other Special Instructions:   N/A    If you need a refill on your cardiac medications before your next appointment, please call your pharmacy.   Call the [place] office at [phone] if you have any questions, problems or concerns.

## 2016-11-03 NOTE — Progress Notes (Signed)
CARDIOLOGY OFFICE NOTE  Date:  11/03/2016    [name] Date of Birth: 24-May-1941 Medical Record [id number]  PCP:  Renato Shin, MD  Cardiologist:  Allred  Chief Complaint  Patient presents with  . Atrial Fibrillation    6 month check - seen for Dr. Rayann Heman    History of Present Illness: [name] is a 75 y.o. male who presents today for a 6 month check. Seen for Dr. Rayann Heman.   He has a history of diastolic HF, morbid obesity, PAF with past cardioversion, venous insufficiency, CKD, anemia, hypothyroidism, DM, COPD, OSA on CPAP, HLD, SSS with PPM in place, CAD (no specifics noted), HTN and depression. On chronic anticoagulation with Eliquis.   He has had several cardioversions - last in July of 2016. He is now on amiodarone. Dr. Rayann Heman and I both have felt like he is not really any more symptomatic from his baseline regardless of his rhythm but [place] has preferred to be in NSR.  Our plan is that If he remains in sinus, will continue on amiodarone 200mg  daily. IF he does not convert or does not have significant clinical improve with cardioversion, it may be better to stop amiodarone and use a rate control strategy long term.  I last saw him almost a year ago - it was still pretty hard to say if how he feels is in any way related to what his rhythm is - but he was in NSR.   Saw Dr. Rayann Heman back in May - felt to be stable.   Comes back today. Here alone. He seems to be holding his own. Breathing is the same. No chest pain. Feels like his rhythm has been "steady". Not dizzy or lightheaded. No passing out. Switching insurance in January - will need refills then but not today. Swelling is unchanged. He is using his CPAP. Some occasional nose bleeds but has been attributed to his CPAP. Overall, feels like he is doing "about the same".   Past Medical History:  Diagnosis Date  . Anemia    hx of  . Anxiety   . Atypical atrial flutter ([place])   . CAD (coronary artery disease)    . Cancer [facility]) 2014   bladder cancer AND RIGHT URETERAL CANCER  . CHF (congestive heart failure) ([place])   . Complete heart block (H[facility])    cardioverted from A.Fib,hx. Sick sinus syndrome -Pacemaker implanted  . COPD (chronic obstructive pulmonary disease) (H[facility])    CPAP  . Depression   . Diabetes mellitus    TypeII  . Diastolic dysfunction with chronic heart failure ([place])   . Gait difficulty    slow gait"ambulates with walker"  . GERD (gastroesophageal reflux disease)   . 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 H/O Legionnaire's disease 2003  . History of blood clots    R groin  . History of kidney stones   . Hyperkalemia   . Hyperlipemia   . Hypertensive cardiovascular-renal disease   . Hypothyroidism   . Morbid obesity ([place])   . OSA on CPAP    using CPAP although it is hard for him to tolerate  . Osteoarthritis    fingers  . Pacemaker    implanted [employer] 7'11  . Pancreatitis   . Peripheral neuropathy ([place])   . Persistent atrial fibrillation ([place])   . Pneumonia 2003  . Renal insufficiency   . Shortness of breath dyspnea  with exertion  . Sick sinus syndrome ([place])    s/p PPM by JA  . Venous insufficiency     Past Surgical History:  Procedure Laterality Date  . BELPHAROPTOSIS REPAIR Right    Glaucoma  . CARDIAC CATHETERIZATION     11-02-13  . CARDIOVERSION N/A 05/16/2014   Procedure: CARDIOVERSION;  Surgeon: Josue Hector, MD;  Location[facility] ENDOSCOPY;  Service: Cardiovascular;  Laterality: N/A;  . CARDIOVERSION N/A 11/08/2014   Procedure: CARDIOVERSION;  Surgeon: Fay Records, MD;  Location: [place];  Service: Cardiovascular;  Laterality: N/A;  . CARDIOVERSION N/A 06/22/2015   Procedure: CARDIOVERSION;  Surgeon: Jerline Pain, MD;  Location: [place];  Service: Cardiovascular;  Laterality: N/A;  . CHOLECYSTECTOMY  2012  . CYSTOSCOPY W/ URETERAL STENT PLACEMENT Right 10/26/2013   Procedure: CYSTOSCOPY WITH RETROGRADE PYELOGRAM/URETERAL STENT  PLACEMENT;  Surgeon: Alexis Frock, MD;  Location: [facility];  Service: Urology;  Laterality: Right;  . CYSTOSCOPY WITH URETEROSCOPY AND STENT PLACEMENT Right 01/11/2014   Procedure: CYSTOSCOPY WITH URETEROSCOPY ,RIGHT RETROGRADE AND STENT CHANGE AND LASER OF URETERAL TUMOR;  Surgeon: Alexis Frock, MD;  Location: [facility];  Service: Urology;  Laterality: Right;  . CYSTOSCOPY/RETROGRADE/URETEROSCOPY Right 08/23/2014   Procedure: CYSTOSCOPY/RETROGRADE/ DIAGNOSTIC URETEROSCOPY/RIGHT RENAL STONE EXTRACTION;  Surgeon: Alexis Frock, MD;  Location: [facility];  Service: Urology;  Laterality: Right;  . EMBOLECTOMY Left 11/02/2013   Procedure: LEFT FEMORAL EMBOLECTOMY, LEFT FEMORAL ARTERY ENDARTERECTOMY WITH DACRON PATCH ANGIOPLASTY.;  Surgeon: Mal Misty, MD;  Location: [place];  Service: Vascular;  Laterality: Left;  . EYE SURGERY Left    cataract  . GROIN DEBRIDEMENT Right 05/08/2015   Procedure: REMOVAL OF RIGHT GROIN MASS;  Surgeon: Angelia Mould, MD;  Location: [place];  Service: Vascular;  Laterality: Right;  . HOLMIUM LASER APPLICATION Right [id number]   Procedure: HOLMIUM LASER APPLICATION;  Surgeon: Alexis Frock, MD;  Location: [facility];  Service: Urology;  Laterality: Right;  . INSERT / REPLACE / REMOVE PACEMAKER  2011  . NASAL SEPTUM SURGERY  1967  . PACEMAKER PLACEMENT  06/2010   [employer] Accent RF DR, Model [id number] ( Serial number [id number])  . TEE WITHOUT CARDIOVERSION N/A 05/16/2014   Procedure: TRANSESOPHAGEAL ECHOCARDIOGRAM (TEE);  Surgeon: Josue Hector, MD;  Location: [facility] ENDOSCOPY;  Service: Cardiovascular;  Laterality: N/A;  . thromboembolectomy and four compartment fasciotomy Right 2009   leg  . TRANSURETHRAL RESECTION OF BLADDER TUMOR WITH GYRUS (TURBT-GYRUS) N/A 10/26/2013   Procedure: TRANSURETHRAL RESECTION OF BLADDER TUMOR WITH GYRUS (TURBT-GYRUS);  Surgeon: Alexis Frock, MD;  Location[facility];  Service: Urology;  Laterality: N/A;  . TRANSURETHRAL RESECTION OF BLADDER  TUMOR WITH GYRUS (TURBT-GYRUS) N/A 01/11/2014   Procedure: TRANSURETHRAL RESECTION OF BLADDER TUMOR WITH GYRUS (TURBT-GYRUS);  Surgeon: Alexis Frock, MD;  Location[facility];  Service: Urology;  Laterality: N/A;  . WISDOM TOOTH EXTRACTION       Medications: Current Outpatient Prescriptions  Medication Sig Dispense Refill  . ACCU-CHEK AVIVA PLUS test strip TEST THREE TIMES DAILY 300 each 1  . acetaminophen (TYLENOL) 500 MG tablet Take 500-1,000 mg by mouth 2 (two) times daily as needed for moderate pain.     Marland Kitchen allopurinol (ZYLOPRIM) 100 MG tablet Take 1 tablet (100 mg total) by mouth daily. 90 tablet 3  . ALPRAZolam (XANAX) 0.25 MG tablet TAKE 1 TABLET BY MOUTH 3 TIMES A DAY AS NEEDED FOR ANXIETY OR SLEEP 50 tablet 2  . amiodarone (PACERONE) 200 MG tablet Take 1  tablet (200 mg total) by mouth daily. 60 tablet 6  . apixaban (ELIQUIS) 5 MG TABS tablet Take 1 tablet (5 mg total) by mouth 2 (two) times daily. 180 tablet 3  . Ascorbic Acid (VITAMIN C) 500 MG tablet Take 500 mg by mouth daily.     . brimonidine (ALPHAGAN P) 0.1 % SOLN Place 1 drop into the left eye 3 (three) times daily.     . calcium carbonate (TUMS - DOSED IN MG ELEMENTAL CALCIUM) 500 MG chewable tablet Chew 2 tablets by mouth 2 (two) times daily as needed for indigestion or heartburn.    . cholecalciferol (VITAMIN D) 1000 UNITS tablet Take 1,000 Units by mouth daily.    Marland Kitchen dextromethorphan (DELSYM) 30 MG/5ML liquid Take 90 mg by mouth 2 (two) times daily as needed for cough.    . docusate sodium (COLACE) 100 MG capsule Take 100 mg by mouth daily as needed for mild constipation.    . dorzolamide-timolol (COSOPT) 22.3-6.8 MG/ML ophthalmic solution Place 1 drop into both eyes 2 (two) times daily.     . fluticasone (FLONASE) 50 MCG/ACT nasal spray USE 2 SPRAYS IN EACN NOSTRIL DAILY 16 g 3  . Fluticasone-Salmeterol (ADVAIR) 100-50 MCG/DOSE AEPB Inhale 1 puff into the lungs 2 (two) times daily. 1 each 11  . furosemide (LASIX) 40 MG tablet  TAKE 1 TABLET BY MOUTH IN THE MORNING AND 1/2 TABLET IN THE EVENING    . guaifenesin (HUMIBID E) 400 MG TABS tablet Take 400 mg by mouth 2 (two) times daily as needed (for cough or congestion).    . insulin regular (NOVOLIN R) 100 units/mL injection 3 times a day (just before each meal) 320-250-200, and 1305 units with your bedtime snack.  And syringes 11/day 900 mL 3  . Insulin Syringe-Needle U-100 30G X 5/16" 1 ML MISC Use to inject insulin. (500 unit syringe) 500 each 2  . Lactobacillus (ACIDOPHILUS) CAPS capsule Take 1 capsule by mouth daily.    Marland Kitchen levothyroxine (SYNTHROID, LEVOTHROID) 75 MCG tablet Take 1 tablet (75 mcg total) by mouth daily before breakfast. 90 tablet 3  . metoprolol tartrate (LOPRESSOR) 25 MG tablet Take 1 tablet (25 mg total) by mouth 3 (three) times daily. 270 tablet 3  . Multiple Vitamin (MULTIVITAMIN WITH MINERALS) TABS tablet Take 1 tablet by mouth daily.    . Omega-3 Fatty Acids (FISH OIL) 1000 MG CAPS Take 1,000 mg by mouth every morning.     Marland Kitchen omeprazole (PRILOSEC) 40 MG capsule TAKE 1 CAPSULE EVERY DAY 90 capsule 1  . rosuvastatin (CRESTOR) 40 MG tablet Take 20 mg by mouth every evening. (1/2) tablet    . traMADol (ULTRAM) 50 MG tablet TAKE 2 TABLETS BY MOUTH TWICE A DAY AS NEEDED 50 tablet 3  . travoprost, benzalkonium, (TRAVATAN) 0.004 % ophthalmic solution Place 1 drop into the left eye at bedtime.    . VENTOLIN HFA 108 (90 Base) MCG/ACT inhaler INHALE 2 PUFFS INTO THE LUNGS EVERY 6 (SIX) HOURS AS NEEDED FOR WHEEZING OR SHORTNESS OF BREATH. 18 Inhaler 1   No current facility-administered medications for this visit.     Allergies: Allergies  Allergen Reactions  . Actos [Pioglitazone] Swelling  . Timolol Other (See Comments)    Slow heart rate but tolerates Cosopt (dorzolamide-timolol)    Social History: The patient  reports that he quit smoking about 9 years ago. His smoking use included Cigarettes. He has a 100.00 pack-year smoking history. He has never  used smokeless tobacco.  He reports that he does not drink alcohol or use drugs.   Family History: The patient's family history includes Cancer in his mother; Heart attack in his father; Liver cancer in his mother.   Review of Systems: Please see the history of present illness.   Otherwise, the review of systems is positive for none.   All other systems are reviewed and negative.   Physical Exam: VS:  BP 120/60   Pulse 70   Ht 5\' 10"  (1.778 m)   Wt (!) 311 lb 12.8 oz (141.4 kg)   BMI 44.74 kg/m  .  BMI Body mass index is 44.74 kg/m.  Wt Readings from Last 3 Encounters:  11/03/16 (!) 311 lb 12.8 oz (141.4 kg)  07/30/16 (!) 312 lb (141.5 kg)  05/07/16 (!) 314 lb 12.8 oz (142.8 kg)    General: Pleasant. Morbidly obese. He is alert and in no acute distress.   HEENT: Normal.  Neck: Supple, no JVD, carotid bruits, or masses noted.  Cardiac: Regular rate and rhythm. His heart tones are quite distant. Chronic edema.  Respiratory:  Lungs are clear to auscultation bilaterally with normal work of breathing.  GI: Very large pannus.  MS: No deformity or atrophy. Gait and ROM intact. He is using a walker.   Skin: Warm and dry. Color is normal.  Neuro:  Strength and sensation are intact and no gross focal deficits noted.  Psych: Alert, appropriate and with normal affect.   LABORATORY DATA:  EKG:  EKG is ordered today. This demonstrates paced rhythm. Lots of artifact.  Lab Results  Component Value Date   WBC 9.7 04/30/2016   HGB 13.9 04/30/2016   HCT 42.0 04/30/2016   PLT 266.0 04/30/2016   GLUCOSE 208 (H) 04/30/2016   CHOL 137 04/30/2016   TRIG (H) 04/30/2016    507.0 Triglyceride is over 400; calculations on Lipids are invalid.   HDL 27.40 (L) 04/30/2016   LDLDIRECT 57.0 04/30/2016   LDLCALC  05/07/2008    UNABLE TO CALCULATE IF TRIGLYCERIDE OVER 400 mg/dL        Total Cholesterol/HDL:CHD Risk Coronary Heart Disease Risk Table                     Men   Women  1/2 Average Risk    3.4   3.3   ALT 23 04/30/2016   AST 23 04/30/2016   NA 136 04/30/2016   K 4.3 04/30/2016   CL 102 04/30/2016   CREATININE 1.65 (H) 04/30/2016   BUN 39 (H) 04/30/2016   CO2 26 04/30/2016   TSH 5.66 (H) 04/30/2016   PSA 0.61 08/09/2014   INR 1.2 (H) 05/29/2015   HGBA1C 7.5 07/30/2016   MICROALBUR 170.0 Repeated and verified X2. (H) 06/27/2013    BNP (last 3 results)  Recent Labs  11/16/15 1117  BNP 56.6    ProBNP (last 3 results)  Recent Labs  04/30/16 1406  PROBNP 60.0     Other Studies Reviewed Today:  Electrical Cardioversion Procedure Note  IMPRESSION:  Successful cardioversion of atrial fibrillation Pacemaker interrogated Aaron Edelman)  SKAINS, [place] 06/22/2015, 3:52 PM   Echo Study Conclusions from June 2015  - Left ventricle: Wall thickness was increased in a pattern of severe LVH. The estimated ejection fraction was 55%. - Aortic valve: There was very mild stenosis. - Mitral valve: Mildly calcified annulus. There was mild regurgitation. - Left atrium: The atrium was dilated. No evidence of thrombus in the atrial cavity or appendage. -  Right atrium: No evidence of thrombus in the atrial cavity or appendage. - Atrial septum: No defect or patent foramen ovale was identified. Echo contrast study showed no right-to-left atrial level shunt, following an increase in RA pressure induced by provocative maneuvers. - Tricuspid valve: No evidence of vegetation. - Pulmonic valve: No evidence of vegetation. - Impressions: No LAA thrombus Proceeded with succesful [place] under propofol anesthesia.  Impressions:  - No LAA thrombus Proceeded with succesful [facility] under propofol anesthesia.  Assessment/Plan: 1. Paroxysmal afib - he is now on amiodarone and has been cardioverted last in July of 2016 - looks to be in NSR. He is doing ok and seems to be at his baseline. Labs today.   2. Chronic diastolic dysfunction - weight down a few pounds. Seems to be holding  his own clinically.   4. Sick sinus syndrome/ complete heart block Followed by Dr. Rayann Heman.   5. Chronic venous stasis changes His BLE edema is primarily due to obesity. Weight loss is encouraged. Support hose are also recommended but I don't think this is possible.  6. Morbid obesity Body mass index is 45.08 kg/(m^2). He has been referred in the past to nutrition and rehab. Unfortunately, I do not see where he will be able to make real substantial changes.   7. OSA He is using his CPAP  8. High risk medicine - lab today. No bleeding noted.   His overall prognosis is quite poor but he seems to be holding on. Recheck his labs today. I will see back as needed.   Current medicines are reviewed with the patient today.  The patient does not have concerns regarding medicines other than what has been noted above.  The following changes have been made:  See above.  Labs/ tests ordered today include:    Orders Placed This Encounter  Procedures  . Basic metabolic panel  . CBC  . Hepatic function panel  . TSH  . EKG 12-Lead     Disposition:   FU with Dr. Rayann Heman in 6 months.    Patient is agreeable to this plan and will call if any problems develop in the interim.   Signed: Burtis Junes, RN, ANP-C 11/03/2016 2:01 PM  [facility] [address] [place], [place]  [address] Phone: [phone] Fax: [phone]

## 2016-11-04 ENCOUNTER — Telehealth: Payer: Self-pay | Admitting: Internal Medicine

## 2016-11-04 ENCOUNTER — Ambulatory Visit
Admission: RE | Admit: 2016-11-04 | Discharge: 2016-11-04 | Disposition: A | Discharge status: Home / Self Care | Payer: Commercial Managed Care - HMO | Source: Ambulatory Visit | Attending: Endocrinology | Admitting: Endocrinology

## 2016-11-04 ENCOUNTER — Ambulatory Visit (INDEPENDENT_AMBULATORY_CARE_PROVIDER_SITE_OTHER): Payer: Commercial Managed Care - HMO | Admitting: Endocrinology

## 2016-11-04 ENCOUNTER — Encounter: Payer: Self-pay | Admitting: Endocrinology

## 2016-11-04 VITALS — BP 122/62 | HR 70 | Ht 70.5 in | Wt 311.0 lb

## 2016-11-04 DIAGNOSIS — R05 Cough: Secondary | ICD-10-CM | POA: Diagnosis not present

## 2016-11-04 DIAGNOSIS — Z794 Long term (current) use of insulin: Secondary | ICD-10-CM

## 2016-11-04 DIAGNOSIS — R059 Cough, unspecified: Secondary | ICD-10-CM

## 2016-11-04 DIAGNOSIS — E1122 Type 2 diabetes mellitus with diabetic chronic kidney disease: Secondary | ICD-10-CM | POA: Diagnosis not present

## 2016-11-04 DIAGNOSIS — N183 Chronic kidney disease, stage 3 (moderate): Secondary | ICD-10-CM | POA: Diagnosis not present

## 2016-11-04 LAB — TSH: TSH: 2.85 mIU/L (ref 0.40–4.50)

## 2016-11-04 LAB — POCT GLYCOSYLATED HEMOGLOBIN (HGB A1C): Hemoglobin A1C: 7.5

## 2016-11-04 MED ORDER — BETAMETHASONE DIPROPIONATE 0.05 % EX CREA: TOPICAL_CREAM | Freq: Two times a day (BID) | CUTANEOUS | 1 refills | Status: DC

## 2016-11-04 NOTE — Telephone Encounter (Signed)
New message  Pt is returning call to [facility]  Please call back

## 2016-11-04 NOTE — Patient Instructions (Addendum)
I have sent a prescription to your pharmacy, for the anti-itch cream. A chest x-ray is requested for you today.  We'll let you know about the results. Please continue the insulin, 3 times a day (just before each meal) 320-260-200, and 140 units with your bedtime snack.   check your blood sugar twice a day.  vary the time of day when you check, between before the 3 meals, and at bedtime.  also check if you have symptoms of your blood sugar being too high or too low.  please keep a record of the readings and bring it to your next appointment here (or you can bring the meter itself).  You can write it on any piece of paper.  please call us sooner if your blood sugar goes below 70, or if you have a lot of readings over 200.  Please come back for a follow-up appointment in 3 months.

## 2016-11-05 ENCOUNTER — Ambulatory Visit (INDEPENDENT_AMBULATORY_CARE_PROVIDER_SITE_OTHER): Payer: Commercial Managed Care - HMO | Admitting: *Deleted

## 2016-11-05 DIAGNOSIS — I495 Sick sinus syndrome: Secondary | ICD-10-CM | POA: Diagnosis not present

## 2016-11-06 ENCOUNTER — Telehealth: Payer: Self-pay | Admitting: Endocrinology

## 2016-11-06 MED ORDER — BETAMETHASONE DIPROPIONATE 0.05 % EX CREA: TOPICAL_CREAM | Freq: Two times a day (BID) | CUTANEOUS | 1 refills | Status: DC

## 2016-11-06 NOTE — Progress Notes (Signed)
Remote pacemaker transmission.   

## 2016-11-06 NOTE — Telephone Encounter (Signed)
Refill submitted per patient's request.  

## 2016-11-06 NOTE — Telephone Encounter (Signed)
Pt checking on refill for beta methasone DP [facility] call into [facility] on [address]

## 2016-11-11 ENCOUNTER — Other Ambulatory Visit: Payer: Self-pay | Admitting: Endocrinology

## 2016-11-11 NOTE — Telephone Encounter (Signed)
Please advise if ok to refill. Medication is listed under a historical provider.  Thanks!  

## 2016-11-12 ENCOUNTER — Other Ambulatory Visit: Payer: Self-pay | Admitting: Internal Medicine

## 2016-11-14 ENCOUNTER — Other Ambulatory Visit: Payer: Self-pay | Admitting: Endocrinology

## 2016-11-18 ENCOUNTER — Encounter: Payer: Self-pay | Admitting: Cardiology

## 2016-12-03 ENCOUNTER — Encounter: Payer: Self-pay | Admitting: Cardiology

## 2016-12-04 DIAGNOSIS — B349 Viral infection, unspecified: Secondary | ICD-10-CM | POA: Diagnosis not present

## 2016-12-04 DIAGNOSIS — R69 Illness, unspecified: Secondary | ICD-10-CM | POA: Diagnosis not present

## 2016-12-18 LAB — CUP PACEART REMOTE DEVICE CHECK
Battery Remaining Longevity: 54 mo
Battery Remaining Percentage: 51 %
Brady Statistic AP VP Percent: 99 %
Brady Statistic AP VS Percent: 1 %
Brady Statistic AS VP Percent: 1 %
Brady Statistic AS VS Percent: 1 %
Implantable Lead Implant Date: 20110705
Implantable Lead Location: 753860
Lead Channel Impedance Value: 400 Ohm
Lead Channel Pacing Threshold Amplitude: 0.75 V
Lead Channel Pacing Threshold Pulse Width: 0.5 ms
Lead Channel Sensing Intrinsic Amplitude: 3.6 mV
Lead Channel Setting Pacing Amplitude: 1 V
Lead Channel Setting Pacing Pulse Width: 0.5 ms
MDC IDC LEAD IMPLANT DT: 20110705
MDC IDC LEAD LOCATION: 753859
MDC IDC MSMT BATTERY VOLTAGE: 2.87 V
MDC IDC MSMT LEADCHNL RA PACING THRESHOLD AMPLITUDE: 0.875 V
MDC IDC MSMT LEADCHNL RA PACING THRESHOLD PULSEWIDTH: 0.5 ms
MDC IDC MSMT LEADCHNL RV IMPEDANCE VALUE: 340 Ohm
MDC IDC MSMT LEADCHNL RV SENSING INTR AMPL: 9.3 mV
MDC IDC PG IMPLANT DT: 20110705
MDC IDC PG SERIAL: 7152830
MDC IDC SESS DTM: 20171129082219
MDC IDC SET LEADCHNL RA PACING AMPLITUDE: 1.875
MDC IDC SET LEADCHNL RV SENSING SENSITIVITY: 4 mV
MDC IDC STAT BRADY RA PERCENT PACED: 99 %
MDC IDC STAT BRADY RV PERCENT PACED: 99 %

## 2016-12-22 ENCOUNTER — Telehealth: Payer: Self-pay | Admitting: *Deleted

## 2016-12-22 ENCOUNTER — Other Ambulatory Visit: Payer: Self-pay | Admitting: *Deleted

## 2016-12-22 MED ORDER — APIXABAN 5 MG PO TABS: 5.0000 mg | ORAL_TABLET | Freq: Two times a day (BID) | ORAL | 3 refills | Status: DC

## 2016-12-22 MED ORDER — FUROSEMIDE 40 MG PO TABS: 40.0000 mg | ORAL_TABLET | Freq: Every day | ORAL | 3 refills | Status: DC

## 2016-12-22 MED ORDER — METOPROLOL TARTRATE 25 MG PO TABS
25.0000 mg | ORAL_TABLET | Freq: Three times a day (TID) | ORAL | 3 refills | Status: DC
Start: 2016-12-22 — End: 2018-01-06

## 2016-12-22 MED ORDER — AMIODARONE HCL 200 MG PO TABS: 200.0000 mg | ORAL_TABLET | Freq: Every day | ORAL | 3 refills | Status: DC

## 2016-12-22 NOTE — Telephone Encounter (Signed)
Pt dropped off letter to change pharmacy to [facility] and sent a list of medications that needed to be refilled.  Also needs [employer] letter faxed to [phone]. Sent in scripts and faxing letter.

## 2016-12-23 ENCOUNTER — Other Ambulatory Visit: Payer: Self-pay

## 2016-12-23 ENCOUNTER — Telehealth: Payer: Self-pay | Admitting: Endocrinology

## 2016-12-23 DIAGNOSIS — D500 Iron deficiency anemia secondary to blood loss (chronic): Secondary | ICD-10-CM

## 2016-12-23 DIAGNOSIS — R0600 Dyspnea, unspecified: Secondary | ICD-10-CM

## 2016-12-23 DIAGNOSIS — E1122 Type 2 diabetes mellitus with diabetic chronic kidney disease: Secondary | ICD-10-CM

## 2016-12-23 DIAGNOSIS — D509 Iron deficiency anemia, unspecified: Secondary | ICD-10-CM

## 2016-12-23 DIAGNOSIS — E7800 Pure hypercholesterolemia, unspecified: Secondary | ICD-10-CM

## 2016-12-23 DIAGNOSIS — Z794 Long term (current) use of insulin: Secondary | ICD-10-CM

## 2016-12-23 DIAGNOSIS — N183 Chronic kidney disease, stage 3 (moderate): Secondary | ICD-10-CM

## 2016-12-23 MED ORDER — OMEPRAZOLE 40 MG PO CPDR: 40.0000 mg | DELAYED_RELEASE_CAPSULE | Freq: Every day | ORAL | 1 refills | Status: DC

## 2016-12-23 MED ORDER — ROSUVASTATIN CALCIUM 40 MG PO TABS: ORAL_TABLET | ORAL | 2 refills | Status: DC

## 2016-12-23 MED ORDER — FLUTICASONE PROPIONATE 50 MCG/ACT NA SUSP: NASAL | 3 refills | Status: DC

## 2016-12-23 MED ORDER — INSULIN REGULAR HUMAN 100 UNIT/ML IJ SOLN: INTRAMUSCULAR | 3 refills | Status: DC

## 2016-12-23 MED ORDER — LEVOTHYROXINE SODIUM 75 MCG PO TABS: 75.0000 ug | ORAL_TABLET | Freq: Every day | ORAL | 3 refills | Status: DC

## 2016-12-23 NOTE — Telephone Encounter (Signed)
I contacted the patient, rx for Crestor, Novolin, Prilosec, Levothyroxine, and Flonase submitted to [employer] [facility] order.

## 2016-12-23 NOTE — Telephone Encounter (Signed)
Pt called in to speak with [facility] about several prescriptions that he has.  He requests call back.

## 2017-01-07 ENCOUNTER — Other Ambulatory Visit: Payer: Self-pay | Admitting: Endocrinology

## 2017-02-04 ENCOUNTER — Encounter: Payer: Self-pay | Admitting: Endocrinology

## 2017-02-04 ENCOUNTER — Ambulatory Visit (INDEPENDENT_AMBULATORY_CARE_PROVIDER_SITE_OTHER): Payer: Medicare HMO | Admitting: Endocrinology

## 2017-02-04 ENCOUNTER — Ambulatory Visit (INDEPENDENT_AMBULATORY_CARE_PROVIDER_SITE_OTHER): Payer: Medicare HMO | Admitting: *Deleted

## 2017-02-04 VITALS — BP 112/64 | HR 70 | Ht 70.5 in | Wt 314.0 lb

## 2017-02-04 DIAGNOSIS — E7800 Pure hypercholesterolemia, unspecified: Secondary | ICD-10-CM | POA: Diagnosis not present

## 2017-02-04 DIAGNOSIS — E1122 Type 2 diabetes mellitus with diabetic chronic kidney disease: Secondary | ICD-10-CM

## 2017-02-04 DIAGNOSIS — D500 Iron deficiency anemia secondary to blood loss (chronic): Secondary | ICD-10-CM

## 2017-02-04 DIAGNOSIS — R0600 Dyspnea, unspecified: Secondary | ICD-10-CM | POA: Diagnosis not present

## 2017-02-04 DIAGNOSIS — Z794 Long term (current) use of insulin: Secondary | ICD-10-CM | POA: Diagnosis not present

## 2017-02-04 DIAGNOSIS — N183 Chronic kidney disease, stage 3 (moderate): Secondary | ICD-10-CM

## 2017-02-04 DIAGNOSIS — I495 Sick sinus syndrome: Secondary | ICD-10-CM

## 2017-02-04 LAB — POCT GLYCOSYLATED HEMOGLOBIN (HGB A1C): Hemoglobin A1C: 7.4

## 2017-02-04 MED ORDER — INSULIN REGULAR HUMAN 100 UNIT/ML IJ SOLN: INTRAMUSCULAR | 3 refills | Status: DC

## 2017-02-04 NOTE — Patient Instructions (Addendum)
Please continue the insulin, 3 times a day (just before each meal) 320-250-200, and 140 units with your bedtime snack.   check your blood sugar twice a day.  vary the time of day when you check, between before the 3 meals, and at bedtime.  also check if you have symptoms of your blood sugar being too high or too low.  please keep a record of the readings and bring it to your next appointment here (or you can bring the meter itself).  You can write it on any piece of paper.  please call us sooner if your blood sugar goes below 70, or if you have a lot of readings over 200.   Please come back for a follow-up appointment in 3 months.

## 2017-02-04 NOTE — Progress Notes (Signed)
Subjective:    Patient ID: [name], male    DOB: 08-Jul-1941, 76 y.o.   MRN: [id number]  HPI Pt returns for f/u of diabetes mellitus: DM type: Insulin-requiring type 2 Dx'ed: [id number] Complications: renal insufficiency, polyneuropathy, and CAD.   Therapy: insulin since 1989.  DKA: never.   Severe hypoglycemia: never.   Pancreatitis: several episodes, most recently in 2013.  Other: he brings a record of his cbg's which i have reviewed today.  It varies from 42-300. It varies widely throughout the day, but is still lowest at supper, and at HS.  pt states he feels well in general, except for doe. He takes reg insulin qac 300-260-220-140 units.  Past Medical History:  Diagnosis Date  . Anemia    hx of  . Anxiety   . Atypical atrial flutter ([place])   . CAD (coronary artery disease)   . Cancer [facility]) 2014   bladder cancer AND RIGHT URETERAL CANCER  . CHF (congestive heart failure) ([place])   . Complete heart block (HCC)    cardioverted from A.Fib,hx. Sick sinus syndrome -Pacemaker implanted  . COPD (chronic obstructive pulmonary disease) (HCC)    CPAP  . Depression   . Diabetes mellitus    TypeII  . Diastolic dysfunction with chronic heart failure ([place])   . Gait difficulty    slow gait"ambulates with walker"  . GERD (gastroesophageal reflux disease)   . 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 H/O Legionnaire's disease 2003  . History of blood clots    R groin  . History of kidney stones   . Hyperkalemia   . Hyperlipemia   . Hypertensive cardiovascular-renal disease   . Hypothyroidism   . Morbid obesity ([place])   . OSA on CPAP    using CPAP although it is hard for him to tolerate  . Osteoarthritis    fingers  . Pacemaker    implanted St. Jude 7'11  . Pancreatitis   . Peripheral neuropathy ([place])   . Persistent atrial fibrillation ([place])   . Pneumonia 2003  . Renal insufficiency   . Shortness of breath dyspnea    with exertion  . Sick sinus syndrome ([place])    s/p  PPM by JA  . Venous insufficiency     Past Surgical History:  Procedure Laterality Date  . BELPHAROPTOSIS REPAIR Right    Glaucoma  . CARDIAC CATHETERIZATION     11-02-13  . CARDIOVERSION N/A 05/16/2014   Procedure: CARDIOVERSION;  Surgeon: Josue Hector, MD;  Location: [facility] ENDOSCOPY;  Service: Cardiovascular;  Laterality: N/A;  . CARDIOVERSION N/A 11/08/2014   Procedure: CARDIOVERSION;  Surgeon: Fay Records, MD;  Location: [place];  Service: Cardiovascular;  Laterality: N/A;  . CARDIOVERSION N/A 06/22/2015   Procedure: CARDIOVERSION;  Surgeon: Jerline Pain, MD;  Location: [place];  Service: Cardiovascular;  Laterality: N/A;  . CHOLECYSTECTOMY  2012  . CYSTOSCOPY W/ URETERAL STENT PLACEMENT Right 10/26/2013   Procedure: CYSTOSCOPY WITH RETROGRADE PYELOGRAM/URETERAL STENT PLACEMENT;  Surgeon: Alexis Frock, MD;  [facility];  Service: Urology;  Laterality: Right;  . CYSTOSCOPY WITH URETEROSCOPY AND STENT PLACEMENT Right 01/11/2014   Procedure: CYSTOSCOPY WITH URETEROSCOPY ,RIGHT RETROGRADE AND STENT CHANGE AND LASER OF URETERAL TUMOR;  Surgeon: Alexis Frock, MD;  [facility];  Service: Urology;  Laterality: Right;  . CYSTOSCOPY/RETROGRADE/URETEROSCOPY Right 08/23/2014   Procedure: CYSTOSCOPY/RETROGRADE/ DIAGNOSTIC URETEROSCOPY/RIGHT RENAL STONE EXTRACTION;  Surgeon: Alexis Frock, MD;  [facility];  Service: Urology;  Laterality: Right;  . EMBOLECTOMY Left 11/02/2013   Procedure: LEFT FEMORAL EMBOLECTOMY, LEFT FEMORAL ARTERY ENDARTERECTOMY WITH DACRON PATCH ANGIOPLASTY.;  Surgeon: Mal Misty, MD;  Location: [place];  Service: Vascular;  Laterality: Left;  . EYE SURGERY Left    cataract  . GROIN DEBRIDEMENT Right 05/08/2015   Procedure: REMOVAL OF RIGHT GROIN MASS;  Surgeon: Angelia Mould, MD;  Location: [place];  Service: Vascular;  Laterality: Right;  . HOLMIUM LASER APPLICATION Right [id number]   Procedure: HOLMIUM LASER APPLICATION;  Surgeon: Alexis Frock, MD;  [facility];  Service: Urology;  Laterality: Right;  . INSERT / REPLACE / REMOVE PACEMAKER  2011  . NASAL SEPTUM SURGERY  1967  . PACEMAKER PLACEMENT  06/2010   [facility] Accent RF DR, Model [id number] ( Serial number [id number])  . TEE WITHOUT CARDIOVERSION N/A 05/16/2014   Procedure: TRANSESOPHAGEAL ECHOCARDIOGRAM (TEE);  Surgeon: Josue Hector, MD;  Location: [facility] ENDOSCOPY;  Service: Cardiovascular;  Laterality: N/A;  . thromboembolectomy and four compartment fasciotomy Right 2009   leg  . TRANSURETHRAL RESECTION OF BLADDER TUMOR WITH GYRUS (TURBT-GYRUS) N/A 10/26/2013   Procedure: TRANSURETHRAL RESECTION OF BLADDER TUMOR WITH GYRUS (TURBT-GYRUS);  Surgeon: Alexis Frock, MD;  [facility];  Service: Urology;  Laterality: N/A;  . TRANSURETHRAL RESECTION OF BLADDER TUMOR WITH GYRUS (TURBT-GYRUS) N/A 01/11/2014   Procedure: TRANSURETHRAL RESECTION OF BLADDER TUMOR WITH GYRUS (TURBT-GYRUS);  Surgeon: Alexis Frock, MD;  [facility];  Service: Urology;  Laterality: N/A;  . WISDOM TOOTH EXTRACTION      Social History   Social History  . Marital status: Married    Spouse name: N/A  . Number of children: N/A  . Years of education: N/A   Occupational History  . 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 Smoking status: Former Smoker    Packs/day: 2.00    Years: 50.00    Types: Cigarettes    Quit date: 12/08/2006  . Smokeless tobacco: Never Used  . Alcohol use No     Comment: quit 3 years ago  . Drug use: No  . Sexual activity: Not Currently   Other Topics Concern  . Not on file   Social History Narrative  . No narrative on file    Current Outpatient Prescriptions on File Prior to Visit  Medication Sig Dispense Refill  . ACCU-CHEK AVIVA PLUS test strip TEST THREE TIMES DAILY 300 each 1  . acetaminophen (TYLENOL) 500 MG tablet Take 500-1,000 mg by mouth 2 (two) times daily as needed for moderate pain.     Marland Kitchen allopurinol  (ZYLOPRIM) 100 MG tablet Take 1 tablet (100 mg total) by mouth daily. 90 tablet 3  . ALPRAZolam (XANAX) 0.25 MG tablet TAKE 1 TABLET BY MOUTH 3 TIMES A DAY AS NEEDED FOR ANXIETY OR SLEEP 50 tablet 0  . amiodarone (PACERONE) 200 MG tablet Take 1 tablet (200 mg total) by mouth daily. 90 tablet 3  . apixaban (ELIQUIS) 5 MG TABS tablet Take 1 tablet (5 mg total) by mouth 2 (two) times daily. 180 tablet 3  . Ascorbic Acid (VITAMIN C) 500 MG tablet Take 500 mg by mouth daily.     . betamethasone dipropionate (DIPROLENE) 0.05 % cream Apply topically 2 (two) times daily. 45 g 1  . brimonidine (ALPHAGAN P) 0.1 % SOLN Place 1 drop into the left eye 3 (three) times daily.     Marland Kitchen  calcium carbonate (TUMS - DOSED IN MG ELEMENTAL CALCIUM) 500 MG chewable tablet Chew 2 tablets by mouth 2 (two) times daily as needed for indigestion or heartburn.    . cholecalciferol (VITAMIN D) 1000 UNITS tablet Take 1,000 Units by mouth daily.    Marland Kitchen dextromethorphan (DELSYM) 30 MG/5ML liquid Take 90 mg by mouth 2 (two) times daily as needed for cough.    . docusate sodium (COLACE) 100 MG capsule Take 100 mg by mouth daily as needed for mild constipation.    . dorzolamide-timolol (COSOPT) 22.3-6.8 MG/ML ophthalmic solution Place 1 drop into both eyes 2 (two) times daily.     . fluticasone (FLONASE) 50 MCG/ACT nasal spray USE 2 SPRAYS IN EACN NOSTRIL DAILY 16 g 3  . Fluticasone-Salmeterol (ADVAIR) 100-50 MCG/DOSE AEPB Inhale 1 puff into the lungs 2 (two) times daily. 1 each 11  . furosemide (LASIX) 40 MG tablet Take 1 tablet (40 mg total) by mouth daily. TAKE 1 TABLET BY MOUTH IN THE MORNING AND 1/2 TABLET IN THE EVENING 135 tablet 3  . guaifenesin (HUMIBID E) 400 MG TABS tablet Take 400 mg by mouth 2 (two) times daily as needed (for cough or congestion).    . Insulin Syringe-Needle U-100 30G X 5/16" 1 ML MISC Use to inject insulin. (500 unit syringe) 500 each 2  . Lactobacillus (ACIDOPHILUS) CAPS capsule Take 1 capsule by mouth daily.     Marland Kitchen levothyroxine (SYNTHROID, LEVOTHROID) 75 MCG tablet Take 1 tablet (75 mcg total) by mouth daily before breakfast. 90 tablet 3  . metoprolol tartrate (LOPRESSOR) 25 MG tablet Take 1 tablet (25 mg total) by mouth 3 (three) times daily. 270 tablet 3  . Multiple Vitamin (MULTIVITAMIN WITH MINERALS) TABS tablet Take 1 tablet by mouth daily.    . Omega-3 Fatty Acids (FISH OIL) 1000 MG CAPS Take 1,000 mg by mouth every morning.     Marland Kitchen omeprazole (PRILOSEC) 40 MG capsule Take 1 capsule (40 mg total) by mouth daily. 90 capsule 1  . rosuvastatin (CRESTOR) 40 MG tablet TAKE 1/2 TABLET EVERY EVENING 45 tablet 2  . traMADol (ULTRAM) 50 MG tablet TAKE 2 TABLETS BY MOUTH TWICE A DAY AS NEEDED 50 tablet 1  . travoprost, benzalkonium, (TRAVATAN) 0.004 % ophthalmic solution Place 1 drop into the left eye at bedtime.    . VENTOLIN HFA 108 (90 Base) MCG/ACT inhaler INHALE 2 PUFFS INTO THE LUNGS EVERY 6 (SIX) HOURS AS NEEDED FOR WHEEZING OR SHORTNESS OF BREATH. 18 Inhaler 1   No current facility-administered medications on file prior to visit.     Allergies  Allergen Reactions  . Actos [Pioglitazone] Swelling  . Timolol Other (See Comments)    Slow heart rate but tolerates Cosopt (dorzolamide-timolol)    Family History  Problem Relation Age of Onset  . Liver cancer Mother     deceased age 72  . Cancer Mother     liver cancer  . Colon cancer Neg Hx   . Heart attack Father    BP 112/64   Pulse 70   Ht 5' 10.5" (1.791 m)   Wt (!) 314 lb (142.4 kg)   SpO2 95%   BMI 44.42 kg/m   Review of Systems Denies LOC.      Objective:   Physical Exam VITAL SIGNS:  See vs page. GENERAL: no distress. CV: dorsalis pedis pulses intact bilat.  EXTEMITIES: no deformity. 2+ bilat leg edema. . There is bilateral onychomycosis.  Neuro: sensation is intact to touch on  the feet, but decreased from normal.  Pulses: dorsalis pedis absent bilat (prob due to edema). Skin: normal color and temp on the feet. No  ulcer. Several healed surgical scars at the right leg. There is very dry skin, andspotty hyperpigmentation of the legs.   A1c=7.4%.      Assessment & Plan:  Insulin-requiring type 2 DM, with CAD: The pattern of his cbg's indicates he needs some adjustment in his therapy.    Patient is advised the following: Patient Instructions  Please continue the insulin, 3 times a day (just before each meal) 320-250-200, and 140 units with your bedtime snack.   check your blood sugar twice a day.  vary the time of day when you check, between before the 3 meals, and at bedtime.  also check if you have symptoms of your blood sugar being too high or too low.  please keep a record of the readings and bring it to your next appointment here (or you can bring the meter itself).  You can write it on any piece of paper.  please call us sooner if your blood sugar goes below 70, or if you have a lot of readings over 200.   Please come back for a follow-up appointment in 3 months.

## 2017-02-04 NOTE — Progress Notes (Signed)
Remote pacemaker transmission.   

## 2017-02-05 ENCOUNTER — Encounter: Payer: Self-pay | Admitting: Cardiology

## 2017-02-10 LAB — CUP PACEART REMOTE DEVICE CHECK
Brady Statistic AS VP Percent: 1 %
Brady Statistic RA Percent Paced: 99 %
Brady Statistic RV Percent Paced: 99 %
Date Time Interrogation Session: 20180228085557
Implantable Lead Implant Date: 20110705
Implantable Lead Location: 753859
Lead Channel Pacing Threshold Pulse Width: 0.5 ms
Lead Channel Sensing Intrinsic Amplitude: 12 mV
Lead Channel Setting Pacing Amplitude: 1.875
Lead Channel Setting Pacing Pulse Width: 0.5 ms
Lead Channel Setting Sensing Sensitivity: 4 mV
MDC IDC LEAD IMPLANT DT: 20110705
MDC IDC LEAD LOCATION: 753860
MDC IDC MSMT BATTERY REMAINING LONGEVITY: 48 mo
MDC IDC MSMT BATTERY REMAINING PERCENTAGE: 46 %
MDC IDC MSMT BATTERY VOLTAGE: 2.86 V
MDC IDC MSMT LEADCHNL RA IMPEDANCE VALUE: 450 Ohm
MDC IDC MSMT LEADCHNL RA PACING THRESHOLD AMPLITUDE: 0.875 V
MDC IDC MSMT LEADCHNL RA SENSING INTR AMPL: 2.4 mV
MDC IDC MSMT LEADCHNL RV IMPEDANCE VALUE: 340 Ohm
MDC IDC MSMT LEADCHNL RV PACING THRESHOLD AMPLITUDE: 0.75 V
MDC IDC MSMT LEADCHNL RV PACING THRESHOLD PULSEWIDTH: 0.5 ms
MDC IDC PG IMPLANT DT: 20110705
MDC IDC SET LEADCHNL RV PACING AMPLITUDE: 1 V
MDC IDC STAT BRADY AP VP PERCENT: 99 %
MDC IDC STAT BRADY AP VS PERCENT: 1 %
MDC IDC STAT BRADY AS VS PERCENT: 1 %
Pulse Gen Model: 2210
Pulse Gen Serial Number: 7152830

## 2017-02-19 ENCOUNTER — Encounter: Payer: Self-pay | Admitting: Cardiology

## 2017-03-17 ENCOUNTER — Encounter: Payer: Self-pay | Admitting: Endocrinology

## 2017-03-17 DIAGNOSIS — Z961 Presence of intraocular lens: Secondary | ICD-10-CM | POA: Diagnosis not present

## 2017-03-17 DIAGNOSIS — E119 Type 2 diabetes mellitus without complications: Secondary | ICD-10-CM | POA: Diagnosis not present

## 2017-03-17 DIAGNOSIS — H401133 Primary open-angle glaucoma, bilateral, severe stage: Secondary | ICD-10-CM | POA: Diagnosis not present

## 2017-03-17 DIAGNOSIS — H35033 Hypertensive retinopathy, bilateral: Secondary | ICD-10-CM | POA: Diagnosis not present

## 2017-03-17 LAB — HM DIABETES EYE EXAM

## 2017-03-29 ENCOUNTER — Other Ambulatory Visit: Payer: Self-pay | Admitting: Endocrinology

## 2017-04-16 ENCOUNTER — Other Ambulatory Visit: Payer: Self-pay | Admitting: Endocrinology

## 2017-04-17 ENCOUNTER — Telehealth: Payer: Self-pay

## 2017-04-17 NOTE — Telephone Encounter (Signed)
Notified pt that refills are ready for pick up. He will come next week. Nothing further needed.

## 2017-04-27 ENCOUNTER — Encounter: Payer: Self-pay | Admitting: Internal Medicine

## 2017-04-27 ENCOUNTER — Ambulatory Visit (INDEPENDENT_AMBULATORY_CARE_PROVIDER_SITE_OTHER): Payer: Medicare HMO | Admitting: Internal Medicine

## 2017-04-27 VITALS — BP 116/64 | HR 74 | Ht 70.0 in | Wt 313.0 lb

## 2017-04-27 DIAGNOSIS — I442 Atrioventricular block, complete: Secondary | ICD-10-CM

## 2017-04-27 DIAGNOSIS — I480 Paroxysmal atrial fibrillation: Secondary | ICD-10-CM

## 2017-04-27 DIAGNOSIS — I495 Sick sinus syndrome: Secondary | ICD-10-CM

## 2017-04-27 DIAGNOSIS — I5032 Chronic diastolic (congestive) heart failure: Secondary | ICD-10-CM | POA: Diagnosis not present

## 2017-04-27 DIAGNOSIS — I130 Hypertensive heart and chronic kidney disease with heart failure and stage 1 through stage 4 chronic kidney disease, or unspecified chronic kidney disease: Secondary | ICD-10-CM

## 2017-04-27 LAB — CUP PACEART INCLINIC DEVICE CHECK
Battery Voltage: 2.84 V
Brady Statistic RA Percent Paced: 99.76 %
Brady Statistic RV Percent Paced: 99.97 %
Implantable Lead Implant Date: 20110705
Implantable Lead Location: 753859
Implantable Lead Location: 753860
Lead Channel Impedance Value: 462.5 Ohm
Lead Channel Pacing Threshold Amplitude: 0.75 V
Lead Channel Pacing Threshold Amplitude: 1 V
Lead Channel Pacing Threshold Pulse Width: 0.5 ms
Lead Channel Pacing Threshold Pulse Width: 0.5 ms
Lead Channel Sensing Intrinsic Amplitude: 12 mV
Lead Channel Setting Pacing Amplitude: 1.875
Lead Channel Setting Sensing Sensitivity: 4 mV
MDC IDC LEAD IMPLANT DT: 20110705
MDC IDC MSMT LEADCHNL RA PACING THRESHOLD AMPLITUDE: 1 V
MDC IDC MSMT LEADCHNL RA SENSING INTR AMPL: 3.3 mV
MDC IDC MSMT LEADCHNL RV IMPEDANCE VALUE: 337.5 Ohm
MDC IDC MSMT LEADCHNL RV PACING THRESHOLD AMPLITUDE: 0.75 V
MDC IDC MSMT LEADCHNL RV PACING THRESHOLD PULSEWIDTH: 0.5 ms
MDC IDC MSMT LEADCHNL RV PACING THRESHOLD PULSEWIDTH: 0.5 ms
MDC IDC PG IMPLANT DT: 20110705
MDC IDC SESS DTM: 20180521130328
MDC IDC SET LEADCHNL RV PACING AMPLITUDE: 1 V
MDC IDC SET LEADCHNL RV PACING PULSEWIDTH: 0.5 ms
Pulse Gen Model: 2210
Pulse Gen Serial Number: 7152830

## 2017-04-27 NOTE — Progress Notes (Signed)
PCP: Renato Shin, MD  [name] is a 76 y.o. male who presents today for routine electrophysiology followup.  Since last being seen in our clinic, the patient reports doing very well.  He is not very active.  Walks slowly with a walker.  Chronic venous insufficiency.  Chronic SOB at baseline today.  Today, he denies symptoms of palpitations, chest pain, dizziness, presyncope, or syncope.  The patient is otherwise without complaint today.   Past Medical History:  Diagnosis Date  . Anemia    hx of  . Anxiety   . Atypical atrial flutter ([place])   . CAD (coronary artery disease)   . Cancer [facility]) 2014   bladder cancer AND RIGHT URETERAL CANCER  . CHF (congestive heart failure) ([place])   . Complete heart block (HCC)    cardioverted from A.Fib,hx. Sick sinus syndrome -Pacemaker implanted  . COPD (chronic obstructive pulmonary disease) (HCC)    CPAP  . Depression   . Diabetes mellitus    TypeII  . Diastolic dysfunction with chronic heart failure ([place])   . Gait difficulty    slow gait"ambulates with walker"  . GERD (gastroesophageal reflux disease)   . 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 H/O Legionnaire's disease 2003  . History of blood clots    R groin  . History of kidney stones   . Hyperkalemia   . Hyperlipemia   . Hypertensive cardiovascular-renal disease   . Hypothyroidism   . Morbid obesity ([place])   . OSA on CPAP    using CPAP although it is hard for him to tolerate  . Osteoarthritis    fingers  . Pacemaker    implanted St. Jude 7'11  . Pancreatitis   . Peripheral neuropathy   . Persistent atrial fibrillation ([place])   . Pneumonia 2003  . Renal insufficiency   . Shortness of breath dyspnea    with exertion  . Sick sinus syndrome ([place])    s/p PPM by JA  . Venous insufficiency    Past Surgical History:  Procedure Laterality Date  . BELPHAROPTOSIS REPAIR Right    Glaucoma  . CARDIAC CATHETERIZATION     11-02-13  . CARDIOVERSION N/A 05/16/2014    Procedure: CARDIOVERSION;  Surgeon: Josue Hector, MD;  Location: [facility] ENDOSCOPY;  Service: Cardiovascular;  Laterality: N/A;  . CARDIOVERSION N/A 11/08/2014   Procedure: CARDIOVERSION;  Surgeon: Fay Records, MD;  Location: [place];  Service: Cardiovascular;  Laterality: N/A;  . CARDIOVERSION N/A 06/22/2015   Procedure: CARDIOVERSION;  Surgeon: Jerline Pain, MD;  Location: [place];  Service: Cardiovascular;  Laterality: N/A;  . CHOLECYSTECTOMY  2012  . CYSTOSCOPY W/ URETERAL STENT PLACEMENT Right 10/26/2013   Procedure: CYSTOSCOPY WITH RETROGRADE PYELOGRAM/URETERAL STENT PLACEMENT;  Surgeon: Alexis Frock, MD;  [facility];  Service: Urology;  Laterality: Right;  . CYSTOSCOPY WITH URETEROSCOPY AND STENT PLACEMENT Right 01/11/2014   Procedure: CYSTOSCOPY WITH URETEROSCOPY ,RIGHT RETROGRADE AND STENT CHANGE AND LASER OF URETERAL TUMOR;  Surgeon: Alexis Frock, MD;  [facility];  Service: Urology;  Laterality: Right;  . CYSTOSCOPY/RETROGRADE/URETEROSCOPY Right 08/23/2014   Procedure: CYSTOSCOPY/RETROGRADE/ DIAGNOSTIC URETEROSCOPY/RIGHT RENAL STONE EXTRACTION;  Surgeon: Alexis Frock, MD;  [facility];  Service: Urology;  Laterality: Right;  . EMBOLECTOMY Left 11/02/2013   Procedure: LEFT FEMORAL EMBOLECTOMY, LEFT FEMORAL ARTERY ENDARTERECTOMY WITH DACRON PATCH ANGIOPLASTY.;  Surgeon: Mal Misty, MD;  Location: [place];  Service: Vascular;  Laterality: Left;  .  EYE SURGERY Left    cataract  . GROIN DEBRIDEMENT Right 05/08/2015   Procedure: REMOVAL OF RIGHT GROIN MASS;  Surgeon: Angelia Mould, MD;  Location: [place];  Service: Vascular;  Laterality: Right;  . HOLMIUM LASER APPLICATION Right 02/11/1061   Procedure: HOLMIUM LASER APPLICATION;  Surgeon: Alexis Frock, MD;  [facility];  Service: Urology;  Laterality: Right;  . INSERT / REPLACE / REMOVE PACEMAKER  2011  . NASAL SEPTUM SURGERY  1967  . PACEMAKER PLACEMENT  06/2010   [facility] Accent RF DR, Model  [id number] ( Serial number [id number])  . TEE WITHOUT CARDIOVERSION N/A 05/16/2014   Procedure: TRANSESOPHAGEAL ECHOCARDIOGRAM (TEE);  Surgeon: Josue Hector, MD;  Location[facility] END[facility]COPY;  Service: Cardiovascular;  Laterality: N/A;  . thromboembolectomy and four compartment fasciotomy Right 2009   leg  . TRANSURETHRAL RESECTION OF BLADDER TUMOR WITH GYRUS (TURBT-GYRUS) N/A 10/26/2013   Procedure: TRANSURETHRAL RESECTION OF BLADDER TUMOR WITH GYRUS (TURBT-GYRUS);  Surgeon: Alexis Frock, MD;  [facility];  Service: Urology;  Laterality: N/A;  . TRANSURETHRAL RESECTION OF BLADDER TUMOR WITH GYRUS (TURBT-GYRUS) N/A 01/11/2014   Procedure: TRANSURETHRAL RESECTION OF BLADDER TUMOR WITH GYRUS (TURBT-GYRUS);  Surgeon: Alexis Frock, MD;  [facility];  Service: Urology;  Laterality: N/A;  . WISDOM TOOTH EXTRACTION      ROS- all systems are reviewed and negative except as per HPI above  Current Outpatient Prescriptions  Medication Sig Dispense Refill  . ACCU-CHEK AVIVA PLUS test strip TEST THREE TIMES DAILY 300 each 1  . acetaminophen (TYLENOL) 500 MG tablet Take 500-1,000 mg by mouth 2 (two) times daily as needed for moderate pain.     Marland Kitchen ALPRAZolam (XANAX) 0.25 MG tablet TAKE 1 TABLET BY MOUTH 3 TIMES A DAY AS NEEDED FOR ANXIETY OR SLEEP 50 tablet 0  . amiodarone (PACERONE) 200 MG tablet Take 1 tablet (200 mg total) by mouth daily. 90 tablet 3  . apixaban (ELIQUIS) 5 MG TABS tablet Take 1 tablet (5 mg total) by mouth 2 (two) times daily. 180 tablet 3  . Ascorbic Acid (VITAMIN C) 500 MG tablet Take 500 mg by mouth daily.     . betamethasone dipropionate (DIPROLENE) 0.05 % cream Apply 1 application topically 2 (two) times daily.    . brimonidine (ALPHAGAN P) 0.1 % SOLN Place 1 drop into the left eye 3 (three) times daily.     . calcium carbonate (TUMS - DOSED IN MG ELEMENTAL CALCIUM) 500 MG chewable tablet Chew 2 tablets by mouth 2 (two) times daily as needed for indigestion or heartburn.    .  cholecalciferol (VITAMIN D) 1000 UNITS tablet Take 1,000 Units by mouth daily.    Marland Kitchen dextromethorphan (DELSYM) 30 MG/5ML liquid Take 90 mg by mouth 2 (two) times daily as needed for cough.    . docusate sodium (COLACE) 100 MG capsule Take 100 mg by mouth daily as needed for mild constipation.    . dorzolamide-timolol (COSOPT) 22.3-6.8 MG/ML ophthalmic solution Place 1 drop into both eyes 2 (two) times daily.     . fluticasone (FLONASE) 50 MCG/ACT nasal spray USE 2 SPRAYS IN EACN NOSTRIL DAILY 16 g 3  . Fluticasone-Salmeterol (ADVAIR) 100-50 MCG/DOSE AEPB Inhale 1 puff into the lungs 2 (two) times daily. 1 each 11  . furosemide (LASIX) 40 MG tablet Take 1 tablet (40 mg total) by mouth daily. TAKE 1 TABLET BY MOUTH IN THE MORNING AND 1/2 TABLET IN THE EVENING 135 tablet 3  .  guaifenesin (HUMIBID E) 400 MG TABS tablet Take 400 mg by mouth 2 (two) times daily as needed (for cough or congestion).    . insulin regular (NOVOLIN R) 250 units/2.53mL (100 units/mL) injection 3 times a day (just before each meal) 320-250-200, and 140 units with your bedtime snack. 28 vial 3  . Insulin Syringe-Needle U-100 30G X 5/16" 1 ML MISC Use to inject insulin. (500 unit syringe) 500 each 2  . Lactobacillus (ACIDOPHILUS) CAPS capsule Take 1 capsule by mouth daily.    Marland Kitchen levothyroxine (SYNTHROID, LEVOTHROID) 75 MCG tablet Take 1 tablet (75 mcg total) by mouth daily before breakfast. 90 tablet 3  . metoprolol tartrate (LOPRESSOR) 25 MG tablet Take 1 tablet (25 mg total) by mouth 3 (three) times daily. 270 tablet 3  . Multiple Vitamin (MULTIVITAMIN WITH MINERALS) TABS tablet Take 1 tablet by mouth daily.    . Omega-3 Fatty Acids (FISH OIL) 1000 MG CAPS Take 1,000 mg by mouth every morning.     Marland Kitchen omeprazole (PRILOSEC) 40 MG capsule Take 1 capsule (40 mg total) by mouth daily. 90 capsule 1  . rosuvastatin (CRESTOR) 40 MG tablet Take 20 mg by mouth every evening.    . traMADol (ULTRAM) 50 MG tablet Take 100 mg by mouth 2 (two)  times daily as needed (pain).    Marland Kitchen travoprost, benzalkonium, (TRAVATAN) 0.004 % ophthalmic solution Place 1 drop into the left eye at bedtime.    . VENTOLIN HFA 108 (90 Base) MCG/ACT inhaler INHALE 2 PUFFS INTO THE LUNGS EVERY 6 (SIX) HOURS AS NEEDED FOR WHEEZING OR SHORTNESS OF BREATH. 18 Inhaler 1   No current facility-administered medications for this visit.     Physical Exam: Vitals:   04/27/17 1227  BP: 116/64  Pulse: 74  SpO2: 96%  Weight: (!) 313 lb (142 kg)  Height: 5\' 10"  (1.778 m)    GEN- The patient is chronically ill and debilitated appearing, alert and oriented x 3 today.   Head- normocephalic, atraumatic Eyes-  Sclera clear, conjunctiva pink Ears- hearing is very poor Oropharynx- clear Lungs- Clear to ausculation bilaterally, normal work of breathing Chest- pacemaker pocket is well healed Heart- Regular rate and rhythm GI- soft, NT, ND, + BS Extremities- no clubbing, cyanosis, + edema MS- walks slowly with a walker  Pacemaker interrogation- reviewed in detail today,  See PACEART report  ekg tracing today is reviewed and reveals AV paced rhythm  Assessment and Plan:  1. Sick sinus syndrome/ CHB Normal pacemaker function See Pace Art report No changes today  2. Persistent afib Maintaining sinus rhythm with amiodarone Repeat LFTs/TFTs upon return in 6 months Continue anticoagulationj  3. Chronic diastolic dysfunction Stable No change required today He has venous stasis changes Unfortunately, I think that he is about as optimized as we can get him.  4. Morbid obesity Stable No change required today  5. OSA Compliance with CPAP encouraged  6. Hypertensive cardio-renal disease Stable No change required today   [place] F/u with Truitt Merle in 6 months I will see in a year He may benefit from general cardiology referral ultimately  Thompson Grayer MD, [facility] 04/27/2017 2:22 PM

## 2017-04-27 NOTE — Patient Instructions (Signed)
Medication Instructions:  Your physician recommends that you continue on your current medications as directed. Please refer to the Current Medication list given to you today.   Labwork: None ordered   Testing/Procedures: None ordered    Follow-Up:  Remote monitoring is used to monitor your Pacemaker from home. This monitoring reduces the number of office visits required to check your device to one time per year. It allows [place] to keep an eye on the functioning of your device to ensure it is working properly. You are scheduled for a device check from home on 07/27/17. You may send your transmission at any time that day. If you have a wireless device, the transmission will be sent automatically. After your physician reviews your transmission, you will receive a postcard with your next transmission date.   Your physician wants you to follow-up in: 6 months with Truitt Merle, NP and 12 months with Dr Vallery Ridge will receive a reminder letter in the mail two months in advance. If you don't receive a letter, please call our office to schedule the follow-up appointment.   Any Other Special Instructions Will Be Listed Below (If Applicable).     If you need a refill on your cardiac medications before your next appointment, please call your pharmacy.

## 2017-05-06 ENCOUNTER — Other Ambulatory Visit: Payer: Self-pay

## 2017-05-06 ENCOUNTER — Encounter: Payer: Self-pay | Admitting: Endocrinology

## 2017-05-06 ENCOUNTER — Ambulatory Visit (INDEPENDENT_AMBULATORY_CARE_PROVIDER_SITE_OTHER): Payer: Medicare HMO | Admitting: Endocrinology

## 2017-05-06 VITALS — BP 127/60 | HR 72 | Ht 70.0 in | Wt 313.0 lb

## 2017-05-06 DIAGNOSIS — E7800 Pure hypercholesterolemia, unspecified: Secondary | ICD-10-CM

## 2017-05-06 DIAGNOSIS — E1122 Type 2 diabetes mellitus with diabetic chronic kidney disease: Secondary | ICD-10-CM | POA: Diagnosis not present

## 2017-05-06 DIAGNOSIS — R0600 Dyspnea, unspecified: Secondary | ICD-10-CM | POA: Diagnosis not present

## 2017-05-06 DIAGNOSIS — Z125 Encounter for screening for malignant neoplasm of prostate: Secondary | ICD-10-CM

## 2017-05-06 DIAGNOSIS — N183 Chronic kidney disease, stage 3 unspecified: Secondary | ICD-10-CM

## 2017-05-06 DIAGNOSIS — N259 Disorder resulting from impaired renal tubular function, unspecified: Secondary | ICD-10-CM | POA: Diagnosis not present

## 2017-05-06 DIAGNOSIS — Z794 Long term (current) use of insulin: Secondary | ICD-10-CM | POA: Diagnosis not present

## 2017-05-06 DIAGNOSIS — D500 Iron deficiency anemia secondary to blood loss (chronic): Secondary | ICD-10-CM

## 2017-05-06 LAB — LIPID PANEL
CHOL/HDL RATIO: 3
Cholesterol: 133 mg/dL (ref 0–200)
HDL: 39.7 mg/dL (ref >39.00)
NONHDL: 92.86
Triglycerides: 396 mg/dL — ABNORMAL HIGH (ref 0.0–149.0)
VLDL: 79.2 mg/dL — ABNORMAL HIGH (ref 0.0–40.0)

## 2017-05-06 LAB — CBC WITH DIFFERENTIAL/PLATELET
BASOS PCT: 0.4 % (ref 0.0–3.0)
Basophils Absolute: 0 10*3/uL (ref 0.0–0.1)
EOS ABS: 0.1 10*3/uL (ref 0.0–0.7)
EOS PCT: 1.2 % (ref 0.0–5.0)
HCT: 38 % — ABNORMAL LOW (ref 39.0–52.0)
Hemoglobin: 12.8 g/dL — ABNORMAL LOW (ref 13.0–17.0)
LYMPHS ABS: 2.5 10*3/uL (ref 0.7–4.0)
Lymphocytes Relative: 23.5 % (ref 12.0–46.0)
MCHC: 33.6 g/dL (ref 30.0–36.0)
MCV: 94.7 fl (ref 78.0–100.0)
MONO ABS: 0.9 10*3/uL (ref 0.1–1.0)
Monocytes Relative: 8.4 % (ref 3.0–12.0)
NEUTROS PCT: 66.5 % (ref 43.0–77.0)
Neutro Abs: 7 10*3/uL (ref 1.4–7.7)
Platelets: 236 10*3/uL (ref 150.0–400.0)
RBC: 4.01 Mil/uL — ABNORMAL LOW (ref 4.22–5.81)
RDW: 15.8 % — AB (ref 11.5–15.5)
WBC: 10.5 10*3/uL (ref 4.0–10.5)

## 2017-05-06 LAB — BASIC METABOLIC PANEL
BUN: 30 mg/dL — ABNORMAL HIGH (ref 6–23)
CO2: 26 meq/L (ref 19–32)
Calcium: 9.1 mg/dL (ref 8.4–10.5)
Chloride: 105 mEq/L (ref 96–112)
Creatinine, Ser: 1.6 mg/dL — ABNORMAL HIGH (ref 0.40–1.50)
GFR: 45.12 mL/min — ABNORMAL LOW (ref >60.00)
GLUCOSE: 220 mg/dL — AB (ref 70–99)
POTASSIUM: 3.9 meq/L (ref 3.5–5.1)
SODIUM: 140 meq/L (ref 135–145)

## 2017-05-06 LAB — URIC ACID: URIC ACID, SERUM: 7.8 mg/dL (ref 4.0–7.8)

## 2017-05-06 LAB — HEPATIC FUNCTION PANEL
ALT: 25 U/L (ref 0–53)
AST: 29 U/L (ref 0–37)
Albumin: 3.9 g/dL (ref 3.5–5.2)
Alkaline Phosphatase: 89 U/L (ref 39–117)
BILIRUBIN TOTAL: 0.3 mg/dL (ref 0.2–1.2)
Bilirubin, Direct: 0.1 mg/dL (ref 0.0–0.3)
Total Protein: 6.9 g/dL (ref 6.0–8.3)

## 2017-05-06 LAB — POCT GLYCOSYLATED HEMOGLOBIN (HGB A1C): Hemoglobin A1C: 7.5

## 2017-05-06 LAB — PSA: PSA: 0.73 ng/mL (ref 0.10–4.00)

## 2017-05-06 LAB — IBC PANEL
Iron: 63 ug/dL (ref 42–165)
Saturation Ratios: 13.6 % — ABNORMAL LOW (ref 20.0–50.0)
Transferrin: 332 mg/dL (ref 212.0–360.0)

## 2017-05-06 LAB — LDL CHOLESTEROL, DIRECT: Direct LDL: 59 mg/dL

## 2017-05-06 LAB — TSH: TSH: 2.21 u[IU]/mL (ref 0.35–4.50)

## 2017-05-06 MED ORDER — TRAMADOL HCL 50 MG PO TABS: 100.0000 mg | ORAL_TABLET | Freq: Two times a day (BID) | ORAL | 2 refills | Status: DC | PRN

## 2017-05-06 MED ORDER — ALBUTEROL SULFATE HFA 108 (90 BASE) MCG/ACT IN AERS: 2.0000 | INHALATION_SPRAY | Freq: Four times a day (QID) | RESPIRATORY_TRACT | 1 refills | Status: DC | PRN

## 2017-05-06 MED ORDER — INSULIN REGULAR HUMAN 100 UNIT/ML IJ SOLN: INTRAMUSCULAR | 3 refills | Status: DC

## 2017-05-06 NOTE — Patient Instructions (Addendum)
Please change yuour insulin to the numbers listed below. Please consider these measures for your health:  minimize alcohol.  Do not use tobacco products.  Have a colonoscopy at least every 10 years from age 74.  Keep firearms safely stored.  Always use seat belts.  have working smoke alarms in your home.  See an eye doctor and dentist regularly.  Never drive under the influence of alcohol or drugs (including prescription drugs).  Those with fair skin should take precautions against the sun, and should carefully examine their skin once per month, for any new or changed moles.. It is critically important to prevent falling down (keep floor areas well-lit, dry, and free of loose objects.  If you have a cane, walker, or wheelchair, you should use it, even for short trips around the house.  Wear flat-soled shoes.  Also, try not to rush) Please come back for a follow-up appointment in 3-4 months.

## 2017-05-06 NOTE — Progress Notes (Signed)
Subjective:    Patient ID: [name], male    DOB: [birth year], 76 y.o.   MRN: [id number]  HPI Pt returns for f/u of diabetes mellitus: DM type: Insulin-requiring type 2 Dx'ed: 9480 Complications: renal insufficiency, polyneuropathy, and CAD.   Therapy: insulin since 1989.  DKA: never.   Severe hypoglycemia: never.   Pancreatitis: several episodes, most recently in 2013.  Other: he brings a record of his cbg's which i have reviewed today.  It varies from 44-338. It varies widely throughout the day, but is still lowest at supper, and at HS.  pt states he feels well in general, except for doe.  Past Medical History:  Diagnosis Date  . Anemia    hx of  . Anxiety   . Atypical atrial flutter ([place])   . CAD (coronary artery disease)   . Cancer [facility]) 2014   bladder cancer AND RIGHT URETERAL CANCER  . CHF (congestive heart failure) ([place])   . Complete heart block (HCC)    cardioverted from A.Fib,hx. Sick sinus syndrome -Pacemaker implanted  . COPD (chronic obstructive pulmonary disease) (HCC)    CPAP  . Depression   . Diabetes mellitus    TypeII  . Diastolic dysfunction with chronic heart failure ([place])   . Gait difficulty    slow gait"ambulates with walker"  . GERD (gastroesophageal reflux disease)   . 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 H/O Legionnaire's disease 2003  . History of blood clots    R groin  . History of kidney stones   . Hyperkalemia   . Hyperlipemia   . Hypertensive cardiovascular-renal disease   . Hypothyroidism   . Morbid obesity ([place])   . OSA on CPAP    using CPAP although it is hard for him to tolerate  . Osteoarthritis    fingers  . Pacemaker    implanted St. Jude 7'11  . Pancreatitis   . Peripheral neuropathy   . Persistent atrial fibrillation ([place])   . Pneumonia 2003  . Renal insufficiency   . Shortness of breath dyspnea    with exertion  . Sick sinus syndrome ([place])    s/p PPM by JA  . Venous insufficiency     Past Surgical  History:  Procedure Laterality Date  . BELPHAROPTOSIS REPAIR Right    Glaucoma  . CARDIAC CATHETERIZATION     11-02-13  . CARDIOVERSION N/A 05/16/2014   Procedure: CARDIOVERSION;  Surgeon: Josue Hector, MD;  Location: [facility] ENDOSCOPY;  Service: Cardiovascular;  Laterality: N/A;  . CARDIOVERSION N/A 11/08/2014   Procedure: CARDIOVERSION;  Surgeon: Fay Records, MD;  Location: [place];  Service: Cardiovascular;  Laterality: N/A;  . CARDIOVERSION N/A 06/22/2015   Procedure: CARDIOVERSION;  Surgeon: Jerline Pain, MD;  Location: [place];  Service: Cardiovascular;  Laterality: N/A;  . CHOLECYSTECTOMY  2012  . CYSTOSCOPY W/ URETERAL STENT PLACEMENT Right 10/26/2013   Procedure: CYSTOSCOPY WITH RETROGRADE PYELOGRAM/URETERAL STENT PLACEMENT;  Surgeon: Alexis Frock, MD;  Location: [facility];  Service: Urology;  Laterality: Right;  . CYSTOSCOPY WITH URETEROSCOPY AND STENT PLACEMENT Right 01/11/2014   Procedure: CYSTOSCOPY WITH URETEROSCOPY ,RIGHT RETROGRADE AND STENT CHANGE AND LASER OF URETERAL TUMOR;  Surgeon: Alexis Frock, MD;  Location: [facility];  Service: Urology;  Laterality: Right;  . CYSTOSCOPY/RETROGRADE/URETEROSCOPY Right 08/23/2014   Procedure: CYSTOSCOPY/RETROGRADE/ DIAGNOSTIC URETEROSCOPY/RIGHT RENAL STONE EXTRACTION;  Surgeon: Alexis Frock, MD;  Location: [facility];  Service: Urology;  Laterality: Right;  .  EMBOLECTOMY Left 11/02/2013   Procedure: LEFT FEMORAL EMBOLECTOMY, LEFT FEMORAL ARTERY ENDARTERECTOMY WITH DACRON PATCH ANGIOPLASTY.;  Surgeon: Mal Misty, MD;  Location: [place];  Service: Vascular;  Laterality: Left;  . EYE SURGERY Left    cataract  . GROIN DEBRIDEMENT Right 05/08/2015   Procedure: REMOVAL OF RIGHT GROIN MASS;  Surgeon: Angelia Mould, MD;  Location: [place];  Service: Vascular;  Laterality: Right;  . HOLMIUM LASER APPLICATION Right 03/10/3535   Procedure: HOLMIUM LASER APPLICATION;  Surgeon: Alexis Frock, MD;  Location: [facility];  Service: Urology;   Laterality: Right;  . INSERT / REPLACE / REMOVE PACEMAKER  2011  . NASAL SEPTUM SURGERY  1967  . PACEMAKER PLACEMENT  06/2010   [facility] Accent RF DR, Model [id number] ( Serial number [id number])  . TEE WITHOUT CARDIOVERSION N/A 05/16/2014   Procedure: TRANSESOPHAGEAL ECHOCARDIOGRAM (TEE);  Surgeon: Josue Hector, MD;  Location: [facility] ENDOSCOPY;  Service: Cardiovascular;  Laterality: N/A;  . thromboembolectomy and four compartment fasciotomy Right 2009   leg  . TRANSURETHRAL RESECTION OF BLADDER TUMOR WITH GYRUS (TURBT-GYRUS) N/A 10/26/2013   Procedure: TRANSURETHRAL RESECTION OF BLADDER TUMOR WITH GYRUS (TURBT-GYRUS);  Surgeon: Alexis Frock, MD;  Location: [facility];  Service: Urology;  Laterality: N/A;  . TRANSURETHRAL RESECTION OF BLADDER TUMOR WITH GYRUS (TURBT-GYRUS) N/A 01/11/2014   Procedure: TRANSURETHRAL RESECTION OF BLADDER TUMOR WITH GYRUS (TURBT-GYRUS);  Surgeon: Alexis Frock, MD;  Location: [facility];  Service: Urology;  Laterality: N/A;  . WISDOM TOOTH EXTRACTION      Social History   Social History  . Marital status: Married    Spouse name: N/A  . Number of children: N/A  . Years of education: N/A   Occupational History  . 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 Smoking status: Former Smoker    Packs/day: 2.00    Years: 50.00    Types: Cigarettes    Quit date: 12/08/2006  . Smokeless tobacco: Never Used  . Alcohol use No     Comment: quit 3 years ago  . Drug use: No  . Sexual activity: Not Currently   Other Topics Concern  . Not on file   Social History Narrative  . No narrative on file    Current Outpatient Prescriptions on File Prior to Visit  Medication Sig Dispense Refill  . ACCU-CHEK AVIVA PLUS test strip TEST THREE TIMES DAILY 300 each 1  . acetaminophen (TYLENOL) 500 MG tablet Take 500-1,000 mg by mouth 2 (two) times daily as needed for moderate pain.     Marland Kitchen ALPRAZolam (XANAX) 0.25 MG tablet TAKE 1 TABLET BY MOUTH 3 TIMES A  DAY AS NEEDED FOR ANXIETY OR SLEEP 50 tablet 0  . amiodarone (PACERONE) 200 MG tablet Take 1 tablet (200 mg total) by mouth daily. 90 tablet 3  . apixaban (ELIQUIS) 5 MG TABS tablet Take 1 tablet (5 mg total) by mouth 2 (two) times daily. 180 tablet 3  . Ascorbic Acid (VITAMIN C) 500 MG tablet Take 500 mg by mouth daily.     . betamethasone dipropionate (DIPROLENE) 0.05 % cream Apply 1 application topically 2 (two) times daily.    . brimonidine (ALPHAGAN P) 0.1 % SOLN Place 1 drop into the left eye 3 (three) times daily.     . calcium carbonate (TUMS - DOSED IN MG ELEMENTAL CALCIUM) 500 MG chewable tablet Chew 2 tablets by mouth 2 (two) times daily as needed for  indigestion or heartburn.    . cholecalciferol (VITAMIN D) 1000 UNITS tablet Take 1,000 Units by mouth daily.    Marland Kitchen dextromethorphan (DELSYM) 30 MG/5ML liquid Take 90 mg by mouth 2 (two) times daily as needed for cough.    . docusate sodium (COLACE) 100 MG capsule Take 100 mg by mouth daily as needed for mild constipation.    . dorzolamide-timolol (COSOPT) 22.3-6.8 MG/ML ophthalmic solution Place 1 drop into both eyes 2 (two) times daily.     . fluticasone (FLONASE) 50 MCG/ACT nasal spray USE 2 SPRAYS IN EACN NOSTRIL DAILY 16 g 3  . furosemide (LASIX) 40 MG tablet Take 1 tablet (40 mg total) by mouth daily. TAKE 1 TABLET BY MOUTH IN THE MORNING AND 1/2 TABLET IN THE EVENING 135 tablet 3  . guaifenesin (HUMIBID E) 400 MG TABS tablet Take 400 mg by mouth 2 (two) times daily as needed (for cough or congestion).    . Insulin Syringe-Needle U-100 30G X 5/16" 1 ML MISC Use to inject insulin. (500 unit syringe) 500 each 2  . Lactobacillus (ACIDOPHILUS) CAPS capsule Take 1 capsule by mouth daily.    Marland Kitchen levothyroxine (SYNTHROID, LEVOTHROID) 75 MCG tablet Take 1 tablet (75 mcg total) by mouth daily before breakfast. 90 tablet 3  . metoprolol tartrate (LOPRESSOR) 25 MG tablet Take 1 tablet (25 mg total) by mouth 3 (three) times daily. 270 tablet 3  .  Multiple Vitamin (MULTIVITAMIN WITH MINERALS) TABS tablet Take 1 tablet by mouth daily.    . Omega-3 Fatty Acids (FISH OIL) 1000 MG CAPS Take 1,000 mg by mouth every morning.     Marland Kitchen omeprazole (PRILOSEC) 40 MG capsule Take 1 capsule (40 mg total) by mouth daily. 90 capsule 1  . rosuvastatin (CRESTOR) 40 MG tablet Take 20 mg by mouth every evening.    . travoprost, benzalkonium, (TRAVATAN) 0.004 % ophthalmic solution Place 1 drop into the left eye at bedtime.     No current facility-administered medications on file prior to visit.     Allergies  Allergen Reactions  . Actos [Pioglitazone] Swelling  . Timolol Other (See Comments)    Slow heart rate but tolerates Cosopt (dorzolamide-timolol)    Family History  Problem Relation Age of Onset  . Liver cancer Mother        deceased age 32  . Cancer Mother        liver cancer  . Colon cancer Neg Hx   . Heart attack Father     BP 127/60   Pulse 72   Ht 5\' 10"  (1.778 m)   Wt (!) 313 lb (142 kg)   SpO2 94%   BMI 44.91 kg/m    Review of Systems Denies LOC    Objective:   Physical Exam VITAL SIGNS:  See vs page. GENERAL: no distress. CV: dorsalis pedis pulses absent bilat, possibly due to edema.  EXTEMITIES: no deformity. 2+ bilat leg edema. . There is severe bilateral onychomycosis.  Neuro: sensation is intact to touch on the feet, but decreased from normal.  Pulses: dorsalis pedis absent bilat (prob due to edema). Skin: normal color and temp on the feet. No ulcer. Several healed surgical scars at the right leg. There is very dry skin, andspotty hyperpigmentation of the legs.   Lab Results  Component Value Date   HGBA1C 7.5 05/06/2017   Lab Results  Component Value Date   CHOL 133 05/06/2017   HDL 39.70 05/06/2017   [place]  05/07/2008  UNABLE TO CALCULATE IF TRIGLYCERIDE OVER 400 mg/dL        Total Cholesterol/HDL:CHD Risk Coronary Heart Disease Risk Table                     Men   Women  1/2 Average Risk   3.4    3.3   LDLDIRECT 59.0 05/06/2017   TRIG 396.0 (H) 05/06/2017   CHOLHDL 3 05/06/2017   Lab Results  Component Value Date   TSH 2.21 05/06/2017      Assessment & Plan:  Insulin-requiring type 2 DM, with renal insuff: the pattern of his cbg's indicates he needs some adjustment in his therapy. Hypothyroidism: well-replaced. Please continue the same medication. Dyslipidemia: well-controlled for a non-fasting specimen: Please continue the same medication.    Patient here for Medicare annual wellness visit and management of other chronic and acute problems.     Risk factors: advanced age    28 of Physicians Providing Medical Care to Patient:  See "snapshot"   Activities of Daily Living: In your present state of health, do you have any difficulty performing the following activities (lives with wife)?:  Preparing food and eating?: No  Bathing yourself: No  Getting dressed: No  Using the toilet:No  Moving around from place to place: No problem with walker  In the past year have you fallen or had a near fall?: No    Home Safety: Has smoke detector and wears seat belts. Firearms are safely stored. No excess sun exposure.   Diet and Exercise  Current exercise habits: limited by health problems Dietary issues discussed: pt says not very good.    Depression Screen  Q1: Over the past two weeks, have you felt down, depressed or hopeless? no  Q2: Over the past two weeks, have you felt little interest or pleasure in doing things? no   The following portions of the patient's history were reviewed and updated as appropriate: allergies, current medications, past family history, past medical history, past social history, past surgical history and problem list.   Review of Systems  No change in chronic hearing (declines HA), or visual losses.   Objective:   Vision:  Advertising account executive, so he declines VA today.  Hearing: grossly decreased Body mass index:  See vs page Msk: pt slowly  performs "get-up-and-go" from a sitting position Cognitive Impairment Assessment: cognition, memory and judgment appear normal.  remembers 3/3 at 5 minutes.  excellent recall.  can easily read and write a sentence.  alert and oriented x 3   Assessment:   Medicare wellness utd on preventive parameters    Plan:   During the course of the visit the patient was educated and counseled about appropriate screening and preventive services including:        Fall prevention Diabetes screening  Nutrition counseling   LABS are ordered. Vaccines are UTD today   Patient Instructions (the written plan) was given to the patient.

## 2017-07-17 ENCOUNTER — Other Ambulatory Visit: Payer: Self-pay | Admitting: Endocrinology

## 2017-07-17 DIAGNOSIS — Z794 Long term (current) use of insulin: Secondary | ICD-10-CM

## 2017-07-17 DIAGNOSIS — R0600 Dyspnea, unspecified: Secondary | ICD-10-CM

## 2017-07-17 DIAGNOSIS — D500 Iron deficiency anemia secondary to blood loss (chronic): Secondary | ICD-10-CM

## 2017-07-17 DIAGNOSIS — E1122 Type 2 diabetes mellitus with diabetic chronic kidney disease: Secondary | ICD-10-CM

## 2017-07-17 DIAGNOSIS — N183 Chronic kidney disease, stage 3 (moderate): Secondary | ICD-10-CM

## 2017-07-17 DIAGNOSIS — E7800 Pure hypercholesterolemia, unspecified: Secondary | ICD-10-CM

## 2017-07-22 DIAGNOSIS — H401133 Primary open-angle glaucoma, bilateral, severe stage: Secondary | ICD-10-CM | POA: Diagnosis not present

## 2017-07-22 DIAGNOSIS — H40052 Ocular hypertension, left eye: Secondary | ICD-10-CM | POA: Diagnosis not present

## 2017-07-27 ENCOUNTER — Encounter: Payer: Medicare HMO | Admitting: *Deleted

## 2017-07-28 ENCOUNTER — Encounter (HOSPITAL_COMMUNITY): Payer: Self-pay | Admitting: Emergency Medicine

## 2017-07-28 ENCOUNTER — Inpatient Hospital Stay (HOSPITAL_COMMUNITY)
Admission: EM | Admit: 2017-07-28 | Discharge: 2017-07-31 | DRG: 190 | Disposition: A | Discharge status: Home / Self Care | Payer: Medicare HMO | Attending: Internal Medicine | Admitting: Internal Medicine

## 2017-07-28 ENCOUNTER — Emergency Department (HOSPITAL_COMMUNITY): Payer: Medicare HMO

## 2017-07-28 DIAGNOSIS — Z6841 Body Mass Index (BMI) 40.0 and over, adult: Secondary | ICD-10-CM | POA: Diagnosis not present

## 2017-07-28 DIAGNOSIS — N183 Chronic kidney disease, stage 3 unspecified: Secondary | ICD-10-CM | POA: Diagnosis present

## 2017-07-28 DIAGNOSIS — H409 Unspecified glaucoma: Secondary | ICD-10-CM | POA: Diagnosis present

## 2017-07-28 DIAGNOSIS — K219 Gastro-esophageal reflux disease without esophagitis: Secondary | ICD-10-CM | POA: Diagnosis present

## 2017-07-28 DIAGNOSIS — E1169 Type 2 diabetes mellitus with other specified complication: Secondary | ICD-10-CM | POA: Diagnosis not present

## 2017-07-28 DIAGNOSIS — E039 Hypothyroidism, unspecified: Secondary | ICD-10-CM | POA: Diagnosis present

## 2017-07-28 DIAGNOSIS — T380X5A Adverse effect of glucocorticoids and synthetic analogues, initial encounter: Secondary | ICD-10-CM | POA: Diagnosis present

## 2017-07-28 DIAGNOSIS — E669 Obesity, unspecified: Secondary | ICD-10-CM | POA: Diagnosis not present

## 2017-07-28 DIAGNOSIS — Z7951 Long term (current) use of inhaled steroids: Secondary | ICD-10-CM

## 2017-07-28 DIAGNOSIS — J449 Chronic obstructive pulmonary disease, unspecified: Secondary | ICD-10-CM | POA: Diagnosis not present

## 2017-07-28 DIAGNOSIS — R05 Cough: Secondary | ICD-10-CM | POA: Diagnosis not present

## 2017-07-28 DIAGNOSIS — A419 Sepsis, unspecified organism: Secondary | ICD-10-CM | POA: Diagnosis not present

## 2017-07-28 DIAGNOSIS — E785 Hyperlipidemia, unspecified: Secondary | ICD-10-CM | POA: Diagnosis present

## 2017-07-28 DIAGNOSIS — I480 Paroxysmal atrial fibrillation: Secondary | ICD-10-CM | POA: Diagnosis present

## 2017-07-28 DIAGNOSIS — R69 Illness, unspecified: Secondary | ICD-10-CM | POA: Diagnosis not present

## 2017-07-28 DIAGNOSIS — I350 Nonrheumatic aortic (valve) stenosis: Secondary | ICD-10-CM | POA: Diagnosis not present

## 2017-07-28 DIAGNOSIS — J9621 Acute and chronic respiratory failure with hypoxia: Secondary | ICD-10-CM

## 2017-07-28 DIAGNOSIS — E1165 Type 2 diabetes mellitus with hyperglycemia: Secondary | ICD-10-CM | POA: Diagnosis not present

## 2017-07-28 DIAGNOSIS — Z86718 Personal history of other venous thrombosis and embolism: Secondary | ICD-10-CM

## 2017-07-28 DIAGNOSIS — D72829 Elevated white blood cell count, unspecified: Secondary | ICD-10-CM | POA: Diagnosis present

## 2017-07-28 DIAGNOSIS — Z950 Presence of cardiac pacemaker: Secondary | ICD-10-CM

## 2017-07-28 DIAGNOSIS — I2510 Atherosclerotic heart disease of native coronary artery without angina pectoris: Secondary | ICD-10-CM | POA: Diagnosis present

## 2017-07-28 DIAGNOSIS — R0602 Shortness of breath: Secondary | ICD-10-CM

## 2017-07-28 DIAGNOSIS — Z87891 Personal history of nicotine dependence: Secondary | ICD-10-CM

## 2017-07-28 DIAGNOSIS — Z888 Allergy status to other drugs, medicaments and biological substances status: Secondary | ICD-10-CM

## 2017-07-28 DIAGNOSIS — E1142 Type 2 diabetes mellitus with diabetic polyneuropathy: Secondary | ICD-10-CM | POA: Diagnosis present

## 2017-07-28 DIAGNOSIS — E11649 Type 2 diabetes mellitus with hypoglycemia without coma: Secondary | ICD-10-CM | POA: Diagnosis not present

## 2017-07-28 DIAGNOSIS — Z66 Do not resuscitate: Secondary | ICD-10-CM | POA: Diagnosis not present

## 2017-07-28 DIAGNOSIS — R739 Hyperglycemia, unspecified: Secondary | ICD-10-CM | POA: Diagnosis not present

## 2017-07-28 DIAGNOSIS — F419 Anxiety disorder, unspecified: Secondary | ICD-10-CM | POA: Diagnosis present

## 2017-07-28 DIAGNOSIS — J441 Chronic obstructive pulmonary disease with (acute) exacerbation: Principal | ICD-10-CM

## 2017-07-28 DIAGNOSIS — I130 Hypertensive heart and chronic kidney disease with heart failure and stage 1 through stage 4 chronic kidney disease, or unspecified chronic kidney disease: Secondary | ICD-10-CM | POA: Diagnosis present

## 2017-07-28 DIAGNOSIS — Z8249 Family history of ischemic heart disease and other diseases of the circulatory system: Secondary | ICD-10-CM

## 2017-07-28 DIAGNOSIS — I5032 Chronic diastolic (congestive) heart failure: Secondary | ICD-10-CM | POA: Diagnosis present

## 2017-07-28 DIAGNOSIS — D631 Anemia in chronic kidney disease: Secondary | ICD-10-CM | POA: Diagnosis present

## 2017-07-28 DIAGNOSIS — G4733 Obstructive sleep apnea (adult) (pediatric): Secondary | ICD-10-CM | POA: Diagnosis present

## 2017-07-28 DIAGNOSIS — R6520 Severe sepsis without septic shock: Secondary | ICD-10-CM

## 2017-07-28 DIAGNOSIS — I10 Essential (primary) hypertension: Secondary | ICD-10-CM | POA: Diagnosis not present

## 2017-07-28 DIAGNOSIS — Z7901 Long term (current) use of anticoagulants: Secondary | ICD-10-CM

## 2017-07-28 DIAGNOSIS — Z794 Long term (current) use of insulin: Secondary | ICD-10-CM

## 2017-07-28 DIAGNOSIS — E872 Acidosis: Secondary | ICD-10-CM | POA: Diagnosis present

## 2017-07-28 DIAGNOSIS — Z8551 Personal history of malignant neoplasm of bladder: Secondary | ICD-10-CM

## 2017-07-28 DIAGNOSIS — I959 Hypotension, unspecified: Secondary | ICD-10-CM | POA: Diagnosis present

## 2017-07-28 DIAGNOSIS — E162 Hypoglycemia, unspecified: Secondary | ICD-10-CM | POA: Diagnosis not present

## 2017-07-28 DIAGNOSIS — Z960 Presence of urogenital implants: Secondary | ICD-10-CM | POA: Diagnosis present

## 2017-07-28 DIAGNOSIS — E1122 Type 2 diabetes mellitus with diabetic chronic kidney disease: Secondary | ICD-10-CM | POA: Diagnosis present

## 2017-07-28 LAB — I-STAT CG4 LACTIC ACID, ED: Lactic Acid, Venous: 4.92 mmol/L (ref 0.5–1.9)

## 2017-07-28 LAB — BASIC METABOLIC PANEL
Anion gap: 12 (ref 5–15)
Anion gap: 13 (ref 5–15)
BUN: 35 mg/dL — ABNORMAL HIGH (ref 6–20)
BUN: 36 mg/dL — ABNORMAL HIGH (ref 6–20)
CO2: 22 mmol/L (ref 22–32)
CO2: 18 mmol/L — ABNORMAL LOW (ref 22–32)
Calcium: 9 mg/dL (ref 8.9–10.3)
Calcium: 8.2 mg/dL — ABNORMAL LOW (ref 8.9–10.3)
Chloride: 103 mmol/L (ref 101–111)
Chloride: 101 mmol/L (ref 101–111)
Creatinine, Ser: 1.77 mg/dL — ABNORMAL HIGH (ref 0.61–1.24)
Creatinine, Ser: 1.99 mg/dL — ABNORMAL HIGH (ref 0.61–1.24)
GFR calc Af Amer: 42 mL/min — ABNORMAL LOW (ref >60)
GFR calc Af Amer: 36 mL/min — ABNORMAL LOW (ref >60)
GFR calc non Af Amer: 36 mL/min — ABNORMAL LOW (ref >60)
GFR calc non Af Amer: 31 mL/min — ABNORMAL LOW (ref >60)
Glucose, Bld: 390 mg/dL — ABNORMAL HIGH (ref 65–99)
Glucose, Bld: 626 mg/dL (ref 65–99)
Potassium: 4.5 mmol/L (ref 3.5–5.1)
Potassium: 5 mmol/L (ref 3.5–5.1)
Sodium: 137 mmol/L (ref 135–145)
Sodium: 132 mmol/L — ABNORMAL LOW (ref 135–145)

## 2017-07-28 LAB — PROCALCITONIN: PROCALCITONIN: 5.85 ng/mL

## 2017-07-28 LAB — I-STAT TROPONIN, ED: Troponin i, poc: 0.03 ng/mL (ref 0.00–0.08)

## 2017-07-28 LAB — CBC
HCT: 37.6 % — ABNORMAL LOW (ref 39.0–52.0)
Hemoglobin: 11.9 g/dL — ABNORMAL LOW (ref 13.0–17.0)
MCH: 30.1 pg (ref 26.0–34.0)
MCHC: 31.6 g/dL (ref 30.0–36.0)
MCV: 95.2 fL (ref 78.0–100.0)
Platelets: 208 10*3/uL (ref 150–400)
RBC: 3.95 MIL/uL — ABNORMAL LOW (ref 4.22–5.81)
RDW: 15.5 % (ref 11.5–15.5)
WBC: 19.1 10*3/uL — ABNORMAL HIGH (ref 4.0–10.5)

## 2017-07-28 LAB — PROTIME-INR
INR: 1.33
PROTHROMBIN TIME: 16.6 s — AB (ref 11.4–15.2)

## 2017-07-28 LAB — GLUCOSE, CAPILLARY

## 2017-07-28 LAB — BRAIN NATRIURETIC PEPTIDE: B Natriuretic Peptide: 173.7 pg/mL — ABNORMAL HIGH (ref 0.0–100.0)

## 2017-07-28 LAB — LACTIC ACID, PLASMA
LACTIC ACID, VENOUS: 3 mmol/L — AB (ref 0.5–1.9)
Lactic Acid, Venous: 3.6 mmol/L (ref 0.5–1.9)

## 2017-07-28 LAB — APTT: aPTT: 36 seconds (ref 24–36)

## 2017-07-28 MED ORDER — METOPROLOL TARTRATE 25 MG PO TABS
25.0000 mg | ORAL_TABLET | Freq: Three times a day (TID) | ORAL | Status: DC
  Administered 2017-07-28 – 2017-07-31 (×9): 25 mg via ORAL
  Filled 2017-07-28 (×9): qty 1

## 2017-07-28 MED ORDER — TRAVOPROST (BAK FREE) 0.004 % OP SOLN
1.0000 [drp] | Freq: Every day | OPHTHALMIC | Status: DC
  Administered 2017-07-29 – 2017-07-30 (×2): 1 [drp] via OPHTHALMIC
  Filled 2017-07-28: qty 2.5

## 2017-07-28 MED ORDER — PANTOPRAZOLE SODIUM 40 MG PO TBEC
40.0000 mg | DELAYED_RELEASE_TABLET | Freq: Every day | ORAL | Status: DC
  Administered 2017-07-28 – 2017-07-31 (×4): 40 mg via ORAL
  Filled 2017-07-28 (×4): qty 1

## 2017-07-28 MED ORDER — LEVOTHYROXINE SODIUM 75 MCG PO TABS
75.0000 ug | ORAL_TABLET | Freq: Every day | ORAL | Status: DC
  Administered 2017-07-29 – 2017-07-31 (×3): 75 ug via ORAL
  Filled 2017-07-28 (×3): qty 1

## 2017-07-28 MED ORDER — APIXABAN 5 MG PO TABS
5.0000 mg | ORAL_TABLET | Freq: Two times a day (BID) | ORAL | Status: DC
  Administered 2017-07-28 – 2017-07-31 (×6): 5 mg via ORAL
  Filled 2017-07-28 (×7): qty 1

## 2017-07-28 MED ORDER — GUAIFENESIN 400 MG PO TABS: 400.0000 mg | ORAL_TABLET | ORAL | Status: DC

## 2017-07-28 MED ORDER — TIMOLOL MALEATE 0.5 % OP SOLN
1.0000 [drp] | Freq: Two times a day (BID) | OPHTHALMIC | Status: DC
  Administered 2017-07-28 – 2017-07-31 (×5): 1 [drp] via OPHTHALMIC
  Filled 2017-07-28: qty 5

## 2017-07-28 MED ORDER — INSULIN ASPART 100 UNIT/ML ~~LOC~~ SOLN: 6.0000 [IU] | Freq: Three times a day (TID) | SUBCUTANEOUS | Status: DC

## 2017-07-28 MED ORDER — DEXTROMETHORPHAN POLISTIREX ER 30 MG/5ML PO SUER
90.0000 mg | Freq: Two times a day (BID) | ORAL | Status: DC | PRN
  Filled 2017-07-28: qty 15

## 2017-07-28 MED ORDER — FUROSEMIDE 20 MG PO TABS
20.0000 mg | ORAL_TABLET | Freq: Every day | ORAL | Status: DC
  Administered 2017-07-28: 20 mg via ORAL
  Filled 2017-07-28: qty 1

## 2017-07-28 MED ORDER — IPRATROPIUM-ALBUTEROL 0.5-2.5 (3) MG/3ML IN SOLN
3.0000 mL | Freq: Once | RESPIRATORY_TRACT | Status: AC
  Administered 2017-07-28: 3 mL via RESPIRATORY_TRACT
  Filled 2017-07-28: qty 3

## 2017-07-28 MED ORDER — DEXTROSE 5 % IV SOLN
500.0000 mg | Freq: Once | INTRAVENOUS | Status: AC
  Administered 2017-07-28: 500 mg via INTRAVENOUS
  Filled 2017-07-28: qty 500

## 2017-07-28 MED ORDER — BRIMONIDINE TARTRATE 0.15 % OP SOLN
1.0000 [drp] | Freq: Three times a day (TID) | OPHTHALMIC | Status: DC
  Administered 2017-07-28 – 2017-07-31 (×8): 1 [drp] via OPHTHALMIC
  Filled 2017-07-28: qty 5

## 2017-07-28 MED ORDER — NETARSUDIL DIMESYLATE 0.02 % OP SOLN: 1.0000 [drp] | Freq: Every day | OPHTHALMIC | Status: DC

## 2017-07-28 MED ORDER — ACETAMINOPHEN 650 MG RE SUPP: 650.0000 mg | Freq: Four times a day (QID) | RECTAL | Status: DC | PRN

## 2017-07-28 MED ORDER — ACETAMINOPHEN 325 MG PO TABS
650.0000 mg | ORAL_TABLET | Freq: Four times a day (QID) | ORAL | Status: DC | PRN
  Administered 2017-07-29: 650 mg via ORAL
  Filled 2017-07-28: qty 2

## 2017-07-28 MED ORDER — ALBUTEROL SULFATE (2.5 MG/3ML) 0.083% IN NEBU: 2.5000 mg | INHALATION_SOLUTION | RESPIRATORY_TRACT | Status: DC | PRN

## 2017-07-28 MED ORDER — ALPRAZOLAM 0.25 MG PO TABS: 0.2500 mg | ORAL_TABLET | Freq: Three times a day (TID) | ORAL | Status: DC | PRN

## 2017-07-28 MED ORDER — SODIUM CHLORIDE 0.9 % IV BOLUS (SEPSIS)
500.0000 mL | Freq: Once | INTRAVENOUS | Status: AC
  Administered 2017-07-28: 500 mL via INTRAVENOUS

## 2017-07-28 MED ORDER — SODIUM CHLORIDE 0.9 % IV BOLUS (SEPSIS)
1000.0000 mL | Freq: Once | INTRAVENOUS | Status: AC
  Administered 2017-07-28: 1000 mL via INTRAVENOUS

## 2017-07-28 MED ORDER — DOCUSATE SODIUM 100 MG PO CAPS
100.0000 mg | ORAL_CAPSULE | Freq: Two times a day (BID) | ORAL | Status: DC
  Administered 2017-07-28 – 2017-07-31 (×3): 100 mg via ORAL
  Filled 2017-07-28 (×5): qty 1

## 2017-07-28 MED ORDER — FLUTICASONE PROPIONATE 50 MCG/ACT NA SUSP
2.0000 | Freq: Every day | NASAL | Status: DC
Start: 2017-07-29 — End: 2017-07-29
  Filled 2017-07-28: qty 16

## 2017-07-28 MED ORDER — ROSUVASTATIN CALCIUM 20 MG PO TABS
20.0000 mg | ORAL_TABLET | Freq: Every evening | ORAL | Status: DC
  Administered 2017-07-28 – 2017-07-31 (×4): 20 mg via ORAL
  Filled 2017-07-28 (×4): qty 1

## 2017-07-28 MED ORDER — INSULIN ASPART 100 UNIT/ML ~~LOC~~ SOLN: 0.0000 [IU] | Freq: Three times a day (TID) | SUBCUTANEOUS | Status: DC

## 2017-07-28 MED ORDER — SACCHAROMYCES BOULARDII 250 MG PO CAPS
250.0000 mg | ORAL_CAPSULE | Freq: Every day | ORAL | Status: DC
  Administered 2017-07-28 – 2017-07-31 (×4): 250 mg via ORAL
  Filled 2017-07-28 (×4): qty 1

## 2017-07-28 MED ORDER — IPRATROPIUM-ALBUTEROL 0.5-2.5 (3) MG/3ML IN SOLN: 3.0000 mL | Freq: Once | RESPIRATORY_TRACT | Status: DC

## 2017-07-28 MED ORDER — FUROSEMIDE 40 MG PO TABS: 40.0000 mg | ORAL_TABLET | Freq: Every day | ORAL | Status: DC

## 2017-07-28 MED ORDER — METHYLPREDNISOLONE SODIUM SUCC 125 MG IJ SOLR
60.0000 mg | Freq: Two times a day (BID) | INTRAMUSCULAR | Status: DC
  Administered 2017-07-29 – 2017-07-31 (×6): 60 mg via INTRAVENOUS
  Filled 2017-07-28 (×6): qty 2

## 2017-07-28 MED ORDER — DEXTROSE 5 % IV SOLN
1.0000 g | Freq: Once | INTRAVENOUS | Status: AC
  Administered 2017-07-28: 1 g via INTRAVENOUS
  Filled 2017-07-28: qty 10

## 2017-07-28 MED ORDER — INSULIN ASPART 100 UNIT/ML ~~LOC~~ SOLN: 0.0000 [IU] | Freq: Every day | SUBCUTANEOUS | Status: DC

## 2017-07-28 MED ORDER — DORZOLAMIDE HCL 2 % OP SOLN
1.0000 [drp] | Freq: Two times a day (BID) | OPHTHALMIC | Status: DC
  Administered 2017-07-28 – 2017-07-31 (×6): 1 [drp] via OPHTHALMIC
  Filled 2017-07-28: qty 10

## 2017-07-28 MED ORDER — ONDANSETRON HCL 4 MG/2ML IJ SOLN: 4.0000 mg | Freq: Four times a day (QID) | INTRAMUSCULAR | Status: DC | PRN

## 2017-07-28 MED ORDER — SODIUM CHLORIDE 0.9 % IV SOLN
INTRAVENOUS | Status: DC
  Administered 2017-07-28 – 2017-07-30 (×3): via INTRAVENOUS

## 2017-07-28 MED ORDER — GUAIFENESIN 200 MG PO TABS
400.0000 mg | ORAL_TABLET | Freq: Two times a day (BID) | ORAL | Status: DC | PRN
  Administered 2017-07-30 – 2017-07-31 (×2): 400 mg via ORAL
  Filled 2017-07-28 (×5): qty 2

## 2017-07-28 MED ORDER — INSULIN GLARGINE 100 UNIT/ML ~~LOC~~ SOLN
30.0000 [IU] | Freq: Every day | SUBCUTANEOUS | Status: DC
  Filled 2017-07-28 (×2): qty 0.3

## 2017-07-28 MED ORDER — AMIODARONE HCL 200 MG PO TABS
200.0000 mg | ORAL_TABLET | Freq: Every day | ORAL | Status: DC
  Administered 2017-07-28 – 2017-07-31 (×4): 200 mg via ORAL
  Filled 2017-07-28 (×4): qty 1

## 2017-07-28 MED ORDER — IPRATROPIUM-ALBUTEROL 0.5-2.5 (3) MG/3ML IN SOLN
3.0000 mL | Freq: Four times a day (QID) | RESPIRATORY_TRACT | Status: DC
  Administered 2017-07-28 – 2017-07-29 (×3): 3 mL via RESPIRATORY_TRACT
  Filled 2017-07-28 (×3): qty 3

## 2017-07-28 MED ORDER — OMEGA-3-ACID ETHYL ESTERS 1 G PO CAPS
1.0000 g | ORAL_CAPSULE | Freq: Every day | ORAL | Status: DC
  Administered 2017-07-28 – 2017-07-31 (×4): 1 g via ORAL
  Filled 2017-07-28 (×4): qty 1

## 2017-07-28 MED ORDER — METHYLPREDNISOLONE SODIUM SUCC 125 MG IJ SOLR
80.0000 mg | Freq: Once | INTRAMUSCULAR | Status: AC
  Administered 2017-07-28: 80 mg via INTRAVENOUS
  Filled 2017-07-28: qty 2

## 2017-07-28 MED ORDER — DEXTROSE 5 % IV SOLN
100.0000 mg | Freq: Two times a day (BID) | INTRAVENOUS | Status: DC
  Administered 2017-07-29 – 2017-07-31 (×5): 100 mg via INTRAVENOUS
  Filled 2017-07-28 (×7): qty 100

## 2017-07-28 MED ORDER — TRAMADOL HCL 50 MG PO TABS
100.0000 mg | ORAL_TABLET | Freq: Two times a day (BID) | ORAL | Status: DC | PRN
  Administered 2017-07-29: 100 mg via ORAL
  Filled 2017-07-28: qty 2

## 2017-07-28 MED ORDER — ONDANSETRON HCL 4 MG PO TABS: 4.0000 mg | ORAL_TABLET | Freq: Four times a day (QID) | ORAL | Status: DC | PRN

## 2017-07-28 NOTE — Progress Notes (Signed)
CRITICAL VALUE ALERT  Critical Value:  Troponin 0.05  Date & Time Notied:  07/28/2017 @2135   Provider Notified: schoor  Orders Received/Actions taken: none

## 2017-07-28 NOTE — ED Notes (Signed)
Admitting physician at bedside at this time.

## 2017-07-28 NOTE — ED Notes (Signed)
Pt taken to bathroom to void via wheelchair, upon return, pt was very short of breath but maintained O2 sats at 98%.

## 2017-07-28 NOTE — ED Triage Notes (Signed)
Pt in from home via [facility] with sob that began after waking at 0800, also c/o central NR chest pressure. EMS states pt's sats were low 80's on their arrival, placed on NRB. Pt a&ox4, able to speak in full sentences, CBG 357. Hx of COPD, CHF, Pacemaker, DM. Given 324 ASA and 1 NTG en route

## 2017-07-28 NOTE — H&P (Signed)
History and Physical    EMMA SCHUPP MAU:[id number] DOB: 1941/02/03 DOA: 07/28/2017  PCP: Renato Shin, MD Consultants:  Allred - cardiology; Tresa Moore - urology Patient coming from: Home - lives with wife; NOK: wife, [phone]  Chief Complaint: SOB  HPI: [name] is a 76 y.o. male with medical history significant of pacemaker placement; CKD; afib on anticoagulation; OSA on CPAP; morbid obesity; hypothyroidism; HTN; glaucoma; chronic diastolic heart failure; DM; COPD; bladder cancer; and CAD presenting with SOB.  This morning when he woke up he was SOB, lots of congestion that he couldn't cough up, was panting for breath.  His left arm was hurting a little bit.  Felt like a lot of pressure in his chest.  +chills, shaking like someone having a seizure, no fever.  He felt normal when he went to bed last night.  Slightly productive cough with clear sputum.  No recent sick contacts - he had the flu right after Christmas.  His face has felt very hot, like it's flushed.  He feels very thirsty, with a dry mouth.  He has felt like a lot of tightness and congestion and pressure that is hard to cough up, hard to get a deep breath, brething is very shallow.  He did get NTG en route without improvement but it caused nausea.  Breathing treatment did help some.  He does not wear O2 at home, just CPAP qhs.  He did use Albuterol at home with some improvement.  He takes Mucinex for congestion.  He was previously on a different kind of inhaler for a while but he stopped using it because it wasn't working.   ED Course: Suspect COPD exacerbation, new O2 requirement.  Given Rocephin/Zaithromycin, Duoneb, Solumedrol.  Review of Systems: As per HPI; otherwise review of systems reviewed and negative.   Ambulatory Status:  Ambulates with a cane or walker  Past Medical History:  Diagnosis Date  . Anemia    hx of  . Anxiety   . CAD (coronary artery disease)   . Cancer [facility]) 2014   bladder cancer AND RIGHT  URETERAL CANCER  . COPD (chronic obstructive pulmonary disease) (HCC)    CPAP  . Depression   . Diabetes mellitus    TypeII  . Diastolic dysfunction with chronic heart failure ([place])   . Gait difficulty    slow gait"ambulates with walker"  . GERD (gastroesophageal reflux disease)   . 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 H/O Legionnaire's disease 2003  . History of blood clots    R groin  . History of kidney stones   . Hyperkalemia   . Hyperlipemia   . Hypertensive cardiovascular-renal disease   . Hypothyroidism   . Morbid obesity ([place])   . OSA on CPAP    using CPAP although it is hard for him to tolerate  . Osteoarthritis    fingers  . Pancreatitis   . Peripheral neuropathy   . Persistent atrial fibrillation ([place])   . Pneumonia 2003  . Renal insufficiency   . Shortness of breath dyspnea    with exertion  . Sick sinus syndrome ([place]) 06/2010   s/p PPM by [employer]  . Venous insufficiency     Past Surgical History:  Procedure Laterality Date  . BELPHAROPTOSIS REPAIR Right    Glaucoma  . CARDIAC CATHETERIZATION     11-02-13  . CARDIOVERSION N/A 05/16/2014   Procedure: CARDIOVERSION;  Surgeon: Josue Hector, MD;  Location: [place];  Service: Cardiovascular;  Laterality: N/A;  . CARDIOVERSION N/A 11/08/2014   Procedure: CARDIOVERSION;  Surgeon: Fay Records, MD;  Location: [place];  Service: Cardiovascular;  Laterality: N/A;  . CARDIOVERSION N/A 06/22/2015   Procedure: CARDIOVERSION;  Surgeon: Jerline Pain, MD;  Location: [place];  Service: Cardiovascular;  Laterality: N/A;  . CHOLECYSTECTOMY  2012  . CYSTOSCOPY W/ URETERAL STENT PLACEMENT Right 10/26/2013   Procedure: CYSTOSCOPY WITH RETROGRADE PYELOGRAM/URETERAL STENT PLACEMENT;  Surgeon: Alexis Frock, MD;  Location: [facility];  Service: Urology;  Laterality: Right;  . CYSTOSCOPY WITH URETEROSCOPY AND STENT PLACEMENT Right 01/11/2014   Procedure: CYSTOSCOPY WITH URETEROSCOPY ,RIGHT RETROGRADE AND  STENT CHANGE AND LASER OF URETERAL TUMOR;  Surgeon: Alexis Frock, MD;  Location: [facility];  Service: Urology;  Laterality: Right;  . CYSTOSCOPY/RETROGRADE/URETEROSCOPY Right 08/23/2014   Procedure: CYSTOSCOPY/RETROGRADE/ DIAGNOSTIC URETEROSCOPY/RIGHT RENAL STONE EXTRACTION;  Surgeon: Alexis Frock, MD;  Location: [facility];  Service: Urology;  Laterality: Right;  . EMBOLECTOMY Left 11/02/2013   Procedure: LEFT FEMORAL EMBOLECTOMY, LEFT FEMORAL ARTERY ENDARTERECTOMY WITH DACRON PATCH ANGIOPLASTY.;  Surgeon: Mal Misty, MD;  Location: [place];  Service: Vascular;  Laterality: Left;  . EYE SURGERY Left    cataract  . GROIN DEBRIDEMENT Right 05/08/2015   Procedure: REMOVAL OF RIGHT GROIN MASS;  Surgeon: Angelia Mould, MD;  Location: [place];  Service: Vascular;  Laterality: Right;  . HOLMIUM LASER APPLICATION Right 06/15/4695   Procedure: HOLMIUM LASER APPLICATION;  Surgeon: Alexis Frock, MD;  Location: [facility];  Service: Urology;  Laterality: Right;  . INSERT / REPLACE / REMOVE PACEMAKER  2011  . NASAL SEPTUM SURGERY  1967  . PACEMAKER PLACEMENT  06/2010   [facility] Accent RF DR, Model [id number] ( Serial number [id number])  . TEE WITHOUT CARDIOVERSION N/A 05/16/2014   Procedure: TRANSESOPHAGEAL ECHOCARDIOGRAM (TEE);  Surgeon: Josue Hector, MD;  Location: [facility] ENDOSCOPY;  Service: Cardiovascular;  Laterality: N/A;  . thromboembolectomy and four compartment fasciotomy Right 2009   leg  . TRANSURETHRAL RESECTION OF BLADDER TUMOR WITH GYRUS (TURBT-GYRUS) N/A 10/26/2013   Procedure: TRANSURETHRAL RESECTION OF BLADDER TUMOR WITH GYRUS (TURBT-GYRUS);  Surgeon: Alexis Frock, MD;  [facility];  Service: Urology;  Laterality: N/A;  . TRANSURETHRAL RESECTION OF BLADDER TUMOR WITH GYRUS (TURBT-GYRUS) N/A 01/11/2014   Procedure: TRANSURETHRAL RESECTION OF BLADDER TUMOR WITH GYRUS (TURBT-GYRUS);  Surgeon: Alexis Frock, MD;  [facility];  Service: Urology;  Laterality: N/A;  . WISDOM TOOTH  EXTRACTION      Social History   Social History  . Marital status: Married    Spouse name: N/A  . Number of children: N/A  . Years of education: N/A   Occupational History  . 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 Smoking status: Former Smoker    Packs/day: 2.00    Years: 50.00    Types: Cigarettes    Quit date: 12/08/2006  . Smokeless tobacco: Never Used  . Alcohol use No     Comment: quit 3 years ago  . Drug use: No  . Sexual activity: Not Currently   Other Topics Concern  . Not on file   Social History Narrative  . No narrative on file    Allergies  Allergen Reactions  . Actos [Pioglitazone] Swelling  . Timolol Other (See Comments)    Slow heart rate but tolerates Cosopt (dorzolamide-timolol)    Family History  Problem Relation Age  of Onset  . Liver cancer Mother        deceased age 60  . Cancer Mother        liver cancer  . Heart attack Father   . Colon cancer Neg Hx     Prior to Admission medications   Medication Sig Start Date End Date Taking? Authorizing Provider  acetaminophen (TYLENOL) 500 MG tablet Take 500-1,000 mg by mouth 2 (two) times daily as needed for moderate pain.    Yes [provider]  albuterol (VENTOLIN HFA) 108 (90 Base) MCG/ACT inhaler Inhale 2 puffs into the lungs every 6 (six) hours as needed for wheezing or shortness of breath. 05/06/17  Yes Renato Shin, MD  ALPRAZolam Duanne Moron) 0.25 MG tablet TAKE 1 TABLET BY MOUTH 3 TIMES A DAY AS NEEDED FOR ANXIETY OR SLEEP 04/16/17  Yes Renato Shin, MD  amiodarone (PACERONE) 200 MG tablet Take 1 tablet (200 mg total) by mouth daily. 12/22/16  Yes Burtis Junes, NP  apixaban (ELIQUIS) 5 MG TABS tablet Take 1 tablet (5 mg total) by mouth 2 (two) times daily. 12/22/16  Yes Burtis Junes, NP  Ascorbic Acid (VITAMIN C) 500 MG tablet Take 500 mg by mouth daily.    Yes [provider]  brimonidine (ALPHAGAN P) 0.1 % SOLN Place 1 drop into the left eye  3 (three) times daily.    Yes [provider]  calcium carbonate (TUMS - DOSED IN MG ELEMENTAL CALCIUM) 500 MG chewable tablet Chew 2 tablets by mouth 2 (two) times daily as needed for indigestion or heartburn.   Yes [provider]  cholecalciferol (VITAMIN D) 1000 UNITS tablet Take 2,000 Units by mouth daily.    Yes [provider]  dextromethorphan (DELSYM) 30 MG/5ML liquid Take 90 mg by mouth 2 (two) times daily as needed for cough.   Yes [provider]  docusate sodium (COLACE) 100 MG capsule Take 100 mg by mouth daily as needed for mild constipation.   Yes [provider]  dorzolamide (TRUSOPT) 2 % ophthalmic solution Place 1 drop into both eyes 2 (two) times daily.   Yes [provider]  fluticasone (FLONASE) 50 MCG/ACT nasal spray USE 2 SPRAYS IN EACN NOSTRIL DAILY 12/23/16  Yes Renato Shin, MD  furosemide (LASIX) 40 MG tablet Take 1 tablet (40 mg total) by mouth daily. TAKE 1 TABLET BY MOUTH IN THE MORNING AND 1/2 TABLET IN THE EVENING Patient taking differently: Take 40 mg by mouth See admin instructions. Takes 40 mg in the morning and 20 mg in the evening 12/22/16  Yes Burtis Junes, NP  guaifenesin (HUMIBID E) 400 MG TABS tablet Take 400 mg by mouth 2 (two) times daily as needed (for cough or congestion).   Yes [provider]  Lactobacillus (ACIDOPHILUS) CAPS capsule Take 1 capsule by mouth daily.   Yes [provider]  levothyroxine (SYNTHROID, LEVOTHROID) 75 MCG tablet Take 1 tablet (75 mcg total) by mouth daily before breakfast. 12/23/16  Yes Renato Shin, MD  metoprolol tartrate (LOPRESSOR) 25 MG tablet Take 1 tablet (25 mg total) by mouth 3 (three) times daily. 12/22/16  Yes Burtis Junes, NP  Multiple Vitamin (MULTIVITAMIN WITH MINERALS) TABS tablet Take 1 tablet by mouth daily.   Yes [provider]  Netarsudil Dimesylate (RHOPRESSA) 0.02 % SOLN Place 1 drop into the left eye daily.   Yes  [provider]  NOVOLIN R 100 UNIT/ML injection INJECT 3 TIMES A DAY (JUST BEFORE  EACH MEAL)          320-250-200 AND140UNITS   WITH YOU BEDTIME SNACK Patient taking differently: INJECT 4 TIMES A DAY (JUST BEFORE EACH MEAL)          300 units in the morning, 240 units at noon, 220 units  at supper,  and 150 units at bedtime. 07/17/17  Yes Renato Shin, MD  Omega-3 Fatty Acids (FISH OIL) 1000 MG CAPS Take 1,000 mg by mouth every morning.    Yes [provider]  omeprazole (PRILOSEC) 40 MG capsule Take 1 capsule (40 mg total) by mouth daily. 12/23/16  Yes Renato Shin, MD  Resveratrol 100 MG CAPS Take 100 mg by mouth daily.   Yes [provider]  rosuvastatin (CRESTOR) 40 MG tablet Take 20 mg by mouth every evening.   Yes [provider]  Timolol Maleate 0.5 % (DAILY) SOLN Place 1 drop into both eyes 2 (two) times daily.   Yes [provider]  traMADol (ULTRAM) 50 MG tablet Take 2 tablets (100 mg total) by mouth 2 (two) times daily as needed (pain). 05/06/17  Yes Renato Shin, MD  travoprost, benzalkonium, (TRAVATAN) 0.004 % ophthalmic solution Place 1 drop into the left eye at bedtime.   Yes [provider]    Physical Exam: Vitals:   07/28/17 1830 07/28/17 1927 07/28/17 2032 07/28/17 2246  BP: (!) 117/43 120/68 (!) 115/41   Pulse: 74 70 74   Resp: 18 (!) 22    Temp:   98.7 F (37.1 C)   TempSrc:   Oral   SpO2: 98% 98%  97%  Weight:      Height:         General:  Appears calm and comfortable and is NAD Eyes:  PERRL, EOMI, normal lids, iris ENT:  grossly normal hearing, lips & tongue, mmm; appropriate dentition Neck:  no LAD, masses or thyromegaly; no carotid bruits Cardiovascular:  RRR, no m/r/g.  Respiratory:   CTA bilaterally with no wheezes/rales/rhonchi.  Normal respiratory effort.  On [place] O2 Abdomen:  soft, NT, ND, NABS Back:   normal alignment, no CVAT Skin:  no rash or induration seen on limited exam Musculoskeletal:   grossly normal tone BUE/BLE, good ROM, no bony abnormality Lower extremity:  1+ LE edema.  Limited foot exam with no ulcerations.  2+ distal pulses. Psychiatric:  grossly normal mood and affect, speech fluent and appropriate, AOx3 Neurologic:  CN 2-12 grossly intact, moves all extremities in coordinated fashion, sensation intact    Radiological Exams on Admission: Dg Chest 2 View  Result Date: 07/28/2017 CLINICAL DATA:  One day history of chest tightness, shortness of breath, and cough. History of CHF, coronary artery disease, COPD. Former smoker. EXAM: CHEST  2 VIEW COMPARISON:  PA and lateral chest x-ray of November 04, 2016 FINDINGS: The lungs are well-expanded. The interstitial markings are coarse. The cardiac silhouette is enlarged. The central pulmonary vascularity is prominent. There is no pleural effusion. There is calcification in the wall of the aortic arch. The ICD is in stable position. The observed bony thorax is unremarkable. IMPRESSION: COPD. Superimposed low-grade CHF. No alveolar pneumonia nor significant pleural effusion. Thoracic aortic atherosclerosis. Electronically Signed   By: David  Martinique M.D.   On: 07/28/2017 16:03    EKG: Independently reviewed.  AV paced rhythm with rate 77   Labs on Admission: I have personally reviewed the available labs and imaging studies at the time of the admission.  Pertinent labs:  Glucose 390 BUN 35/Creatinine 1.77/GFR 36 BNP 173.7 Troponin 0.03, repeat 0.05 Lactate 4.92, repeat 3.6, repeat 3.0 Procalcitonin 5.85 WBC 19.1 INR 1.33   Assessment/Plan Principal Problem:   Acute on chronic respiratory failure with hypoxia (HCC) Active Problems:   Obstructive sleep apnea   CKD (chronic kidney disease)   Diabetes mellitus type 2 in obese (HCC)   COPD exacerbation (HCC)   Sepsis ([place])   Acute on chronic respiratory failure resulting from COPD exacerbation -Patient with known h/o COPD not on home O2 and OSA on CPAP presenting  with acute onset of SOB with productive cough -Patient's shortness of breath and productive cough are most likely caused by acute COPD exacerbation.  -Although patient has a tightening across his chest with coughing and a productive cough with leukocytosis, he does not have fever. Chest x-ray is not consistent with pneumonia. -Patient received Rocephin and Azithromycin in the ER; will change to Doxycyline IV starting tomorrow AM (has 3/3 cardinal symptoms). -He was given a DuoNeb in the ED with some improvement; will continue with standing DuoNebs and prn albuterol. -will admit patient - with his morbid obesity and likely poor baseline vital capacity, it seems likely that he will need several days of hospitalization to show sufficient improvement for discharge. -Nebulizers: scheduled Duoneb and prn albuterol -Solu-Medrol 60 mg IV BID  -Elevated WBC count, tachypnea with persistently elevated lactate and elevated procalcitonin -While awaiting blood cultures, this appears to be a preseptic condition. -Sepsis protocol initiated -Blood and urine cultures pending -Will trend lactate to ensure improvement -Continue CPAP -Patient does not know how setting so will use autopap/RT assistance  DM -Patient is on an extremely unusual outpatient regimen that seems unlikely to result in long-standing optimal glycemic control - regular insulin 300 units qAM, 240 units at noon, 220 units at dinner, 150 units qhs -I have stopped this regimen and changed him to Lantus 30 units qhs - which may be grossly inadequate -Will also attempt to cover with resistant-scale SSI -If inadequate, the patient may require transfer to SDU for glucostabilizer protocol - particularly in light of steroid use  CKD -Baseline creatinine appears to be 1.6-1.7 -Will gently hydrate at 75 cc/hr in light of possible mild volume overload commented on with CXR and very mildly elevated BNP -Troponin may be elevated due to renal dysfunction  or demand ischemia, but currently doubt NSTEMI; will trend  DVT prophylaxis: Eliquis Code Status:  DNR - confirmed with patient/family Family Communication: Wife and daughter present throughout evaluation  Disposition Plan:  Home once clinically improved Consults called: CM/Nutrition/RT/SW/PT/OT Admission status: Admit - It is my clinical opinion that admission to INPATIENT is reasonable and necessary because this patient will require at least 2 midnights in the hospital to treat this condition based on the medical complexity of the problems presented.  Given the aforementioned information, the predictability of an adverse outcome is felt to be significant.   Karmen Bongo MD [facility]  If note is complete, please contact covering daytime or nighttime physician. [email or web address] Password [email or web address]  07/29/2017, 1:00 AM

## 2017-07-28 NOTE — Progress Notes (Signed)
Pt placed on CPAP for the night.  Tolerating well. 

## 2017-07-28 NOTE — ED Provider Notes (Signed)
[facility] DEPT Provider Note   CSN: [id number] Arrival date & time: 07/28/17  1519     History   Chief Complaint Chief Complaint  Patient presents with  . Shortness of Breath  . Chest Pain    HPI [name] is a 76 y.o. male.  HPI   [name] is a 76 y.o. male, with a history of CHF, non-demand pacemaker, CAD, a flutter, anemia, COPD, and morbid obesity, presenting to the ED with shortness of breath beginning this morning upon waking around 8AM. Accompanied by chest pressure, congestion, chills, and productive cough, but patient is unable to fully express the sputum. Used his albuterol inhaler once today with some relief. Chest discomfort is 6/10, central chest, radiating to the left shoulder.  Received 324mg  ASA and NTG via EMS. SPO2 in low 80s with EMS. Patient is not on home O2. States the O2 via EMS improved his symptoms.   Denies N/V/D, abdominal pain, rash, headache, or any other complaints.        Past Medical History:  Diagnosis Date  . Anemia    hx of  . Anxiety   . Atypical atrial flutter ([place])   . CAD (coronary artery disease)   . Cancer [facility]) 2014   bladder cancer AND RIGHT URETERAL CANCER  . CHF (congestive heart failure) ([place])   . Complete heart block (HCC)    cardioverted from A.Fib,hx. Sick sinus syndrome -Pacemaker implanted  . COPD (chronic obstructive pulmonary disease) (HCC)    CPAP  . Depression   . Diabetes mellitus    TypeII  . Diastolic dysfunction with chronic heart failure ([place])   . Gait difficulty    slow gait"ambulates with walker"  . GERD (gastroesophageal reflux disease)   . 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 H/O Legionnaire's disease 2003  . History of blood clots    R groin  . History of kidney stones   . Hyperkalemia   . Hyperlipemia   . Hypertensive cardiovascular-renal disease   . Hypothyroidism   . Morbid obesity ([place])   . OSA on CPAP    using CPAP although it is hard for him to tolerate  .  Osteoarthritis    fingers  . Pacemaker    implanted St. Jude 7'11  . Pancreatitis   . Peripheral neuropathy   . Persistent atrial fibrillation ([place])   . Pneumonia 2003  . Renal insufficiency   . Shortness of breath dyspnea    with exertion  . Sick sinus syndrome ([place])    s/p PPM by JA  . Venous insufficiency     Patient Active Problem List   Diagnosis Date Noted  . Dyspnea 04/30/2016  . Controlled type 2 diabetes mellitus with stage 3 chronic kidney disease, with long-term current use of insulin ([place]) 01/20/2016  . Paroxysmal atrial fibrillation (HCC)   . Wrist pain 01/30/2015  . De Quervain's tenosynovitis, right 01/03/2015  . Sick sinus syndrome ([place]) 07/02/2014  . Complete heart block ([place]) 07/02/2014  . Trigger finger, acquired 06/07/2014  . Shortness of breath 05/13/2014  . CKD (chronic kidney disease) 05/13/2014  . Encounter for therapeutic drug monitoring 02/15/2014  . Bladder cancer ([place]) 12/14/2013  . Thrombus 12/05/2013  . 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 Ischemic leg 11/01/2013  . Acute pulmonary edema ([place]) 10/27/2013  . Low back pain 10/20/2013  . Depressive disorder, not elsewhere classified 10/20/2013  . Venous insufficiency 07/07/2013  .  Postural dizziness 07/07/2013  . Encounter for long-term (current) use of other medications 06/27/2013  . Fatigue 06/23/2012  . UTI (lower urinary tract infection) 05/31/2012  . Pyuria 05/12/2012  . A-fib ([place]) 06/25/2011  . Screening for prostate cancer 05/06/2011  . Pre-operative cardiovascular examination 04/29/2011  . Epigastric abdominal pain 03/13/2011  . 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 B12 DEFICIENCY 12/18/2010  . PANCREATITIS 12/17/2010  . PSEUDOCYST, PANCREAS 12/17/2010  . DIASTOLIC HEART FAILURE, CHRONIC 04/16/2010  . CHEST PAIN, PLEURITIC 12/26/2009  . SINUSITIS, CHRONIC 12/11/2009  . Obstructive sleep apnea 11/08/2009  . Disorder resulting from impaired renal function  11/06/2009  . OBESITY-MORBID (>100') 10/29/2009  . HYPOGONADISM 10/01/2009  . 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 HEARING LOSS 04/26/2009  . Cough 03/08/2009  . EDEMA 01/09/2009  . HYPERKALEMIA 09/07/2008  . BRADYCARDIA 08/01/2008  . ANEMIA, IRON DEFICIENCY 06/22/2008  . LEUKOCYTOSIS 06/22/2008  . FATTY LIVER DISEASE 06/20/2008  . ACUTE CYSTITIS 06/20/2008  . Glaucoma 05/18/2008  . COPD 05/18/2008  . SEBACEOUS CYST 03/28/2008  . HYPERCHOLESTEROLEMIA 01/18/2008  . NAUSEA 12/07/2007  . 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 Coronary atherosclerosis 07/06/2007  . GERD 07/06/2007    Past Surgical History:  Procedure Laterality Date  . BELPHAROPTOSIS REPAIR Right    Glaucoma  . CARDIAC CATHETERIZATION     11-02-13  . CARDIOVERSION N/A 05/16/2014   Procedure: CARDIOVERSION;  Surgeon: Josue Hector, MD;  Location: [facility] ENDOSCOPY;  Service: Cardiovascular;  Laterality: N/A;  . CARDIOVERSION N/A 11/08/2014   Procedure: CARDIOVERSION;  Surgeon: Fay Records, MD;  Location: [facility];  Service: Cardiovascular;  Laterality: N/A;  . CARDIOVERSION N/A 06/22/2015   Procedure: CARDIOVERSION;  Surgeon: Jerline Pain, MD;  Location: [place];  Service: Cardiovascular;  Laterality: N/A;  . CHOLECYSTECTOMY  2012  . CYSTOSCOPY W/ URETERAL STENT PLACEMENT Right 10/26/2013   Procedure: CYSTOSCOPY WITH RETROGRADE PYELOGRAM/URETERAL STENT PLACEMENT;  Surgeon: Alexis Frock, MD;  Location: [facility] ORS;  Service: Urology;  Laterality: Right;  . CYSTOSCOPY WITH URETEROSCOPY AND STENT PLACEMENT Right 01/11/2014   Procedure: CYSTOSCOPY WITH URETEROSCOPY ,RIGHT RETROGRADE AND STENT CHANGE AND LASER OF URETERAL TUMOR;  Surgeon: Alexis Frock, MD;  Location: [facility]S;  Service: Urology;  Laterality: Right;  . CYSTOSCOPY/RETROGRADE/URETEROSCOPY Right 08/23/2014   Procedure: CYSTOSCOPY/RETROGRADE/ DIAGNOSTIC URETEROSCOPY/RIGHT RENAL STONE EXTRACTION;  Surgeon: Alexis Frock, MD;  Location:  [facility];  Service: Urology;  Laterality: Right;  . EMBOLECTOMY Left 11/02/2013   Procedure: LEFT FEMORAL EMBOLECTOMY, LEFT FEMORAL ARTERY ENDARTERECTOMY WITH DACRON PATCH ANGIOPLASTY.;  Surgeon: Mal Misty, MD;  Location: [facility];  Service: Vascular;  Laterality: Left;  . EYE SURGERY Left    cataract  . GROIN DEBRIDEMENT Right 05/08/2015   Procedure: REMOVAL OF RIGHT GROIN MASS;  Surgeon: Angelia Mould, MD;  Location: [facility];  Service: Vascular;  Laterality: Right;  . HOLMIUM LASER APPLICATION Right 02/06/9517   Procedure: HOLMIUM LASER APPLICATION;  Surgeon: Alexis Frock, MD;  Location: [facility];  Service: Urology;  Laterality: Right;  . INSERT / REPLACE / REMOVE PACEMAKER  2011  . NASAL SEPTUM SURGERY  1967  . PACEMAKER PLACEMENT  06/2010   [facility] Accent RF DR, Model [id number] ( Serial number [id number])  . TEE WITHOUT CARDIOVERSION N/A 05/16/2014   Procedure: TRANSESOPHAGEAL ECHOCARDIOGRAM (TEE);  Surgeon: Josue Hector, MD;  Location: [facility] ENDOSCOPY;  Service: Cardiovascular;  Laterality: N/A;  . thromboembolectomy and four compartment fasciotomy Right 2009   leg  . TRANSURETHRAL RESECTION OF BLADDER  TUMOR WITH GYRUS (TURBT-GYRUS) N/A 10/26/2013   Procedure: TRANSURETHRAL RESECTION OF BLADDER TUMOR WITH GYRUS (TURBT-GYRUS);  Surgeon: Alexis Frock, MD;  Location: [facility];  Service: Urology;  Laterality: N/A;  . TRANSURETHRAL RESECTION OF BLADDER TUMOR WITH GYRUS (TURBT-GYRUS) N/A 01/11/2014   Procedure: TRANSURETHRAL RESECTION OF BLADDER TUMOR WITH GYRUS (TURBT-GYRUS);  Surgeon: Alexis Frock, MD;  Location: [facility];  Service: Urology;  Laterality: N/A;  . WISDOM TOOTH EXTRACTION         Home Medications    Prior to Admission medications   Medication Sig Start Date End Date Taking? Authorizing Provider  acetaminophen (TYLENOL) 500 MG tablet Take 500-1,000 mg by mouth 2 (two) times daily as needed for moderate pain.    Yes [provider]  albuterol (VENTOLIN HFA) 108  (90 Base) MCG/ACT inhaler Inhale 2 puffs into the lungs every 6 (six) hours as needed for wheezing or shortness of breath. 05/06/17  Yes Renato Shin, MD  ALPRAZolam Duanne Moron) 0.25 MG tablet TAKE 1 TABLET BY MOUTH 3 TIMES A DAY AS NEEDED FOR ANXIETY OR SLEEP 04/16/17  Yes Renato Shin, MD  amiodarone (PACERONE) 200 MG tablet Take 1 tablet (200 mg total) by mouth daily. 12/22/16  Yes Burtis Junes, NP  apixaban (ELIQUIS) 5 MG TABS tablet Take 1 tablet (5 mg total) by mouth 2 (two) times daily. 12/22/16  Yes Burtis Junes, NP  Ascorbic Acid (VITAMIN C) 500 MG tablet Take 500 mg by mouth daily.    Yes [provider]  brimonidine (ALPHAGAN P) 0.1 % SOLN Place 1 drop into the left eye 3 (three) times daily.    Yes [provider]  calcium carbonate (TUMS - DOSED IN MG ELEMENTAL CALCIUM) 500 MG chewable tablet Chew 2 tablets by mouth 2 (two) times daily as needed for indigestion or heartburn.   Yes [provider]  cholecalciferol (VITAMIN D) 1000 UNITS tablet Take 2,000 Units by mouth daily.    Yes [provider]  dextromethorphan (DELSYM) 30 MG/5ML liquid Take 90 mg by mouth 2 (two) times daily as needed for cough.   Yes [provider]  docusate sodium (COLACE) 100 MG capsule Take 100 mg by mouth daily as needed for mild constipation.   Yes [provider]  dorzolamide (TRUSOPT) 2 % ophthalmic solution Place 1 drop into both eyes 2 (two) times daily.   Yes [provider]  fluticasone (FLONASE) 50 MCG/ACT nasal spray USE 2 SPRAYS IN EACN NOSTRIL DAILY 12/23/16  Yes Renato Shin, MD  furosemide (LASIX) 40 MG tablet Take 1 tablet (40 mg total) by mouth daily. TAKE 1 TABLET BY MOUTH IN THE MORNING AND 1/2 TABLET IN THE EVENING Patient taking differently: Take 40 mg by mouth See admin instructions. Takes 40 mg in the morning and 20 mg in the evening 12/22/16  Yes Burtis Junes, NP  guaifenesin (HUMIBID E) 400 MG TABS tablet Take 400 mg by  mouth 2 (two) times daily as needed (for cough or congestion).   Yes [provider]  Lactobacillus (ACIDOPHILUS) CAPS capsule Take 1 capsule by mouth daily.   Yes [provider]  levothyroxine (SYNTHROID, LEVOTHROID) 75 MCG tablet Take 1 tablet (75 mcg total) by mouth daily before breakfast. 12/23/16  Yes Renato Shin, MD  metoprolol tartrate (LOPRESSOR) 25 MG tablet Take 1 tablet (25 mg total) by mouth 3 (three) times daily. 12/22/16  Yes Burtis Junes, NP  Multiple Vitamin (MULTIVITAMIN WITH MINERALS) TABS tablet Take 1 tablet by  mouth daily.   Yes [provider]  Netarsudil Dimesylate (RHOPRESSA) 0.02 % SOLN Place 1 drop into the left eye daily.   Yes [provider]  NOVOLIN R 100 UNIT/ML injection INJECT 3 TIMES A DAY (JUST BEFORE EACH MEAL)          320-250-200 AND140UNITS   WITH YOU BEDTIME SNACK Patient taking differently: INJECT 4 TIMES A DAY (JUST BEFORE EACH MEAL)          300 units in the morning, 240 units at noon, 220 units  at supper,  and 150 units at bedtime. 07/17/17  Yes Renato Shin, MD  Omega-3 Fatty Acids (FISH OIL) 1000 MG CAPS Take 1,000 mg by mouth every morning.    Yes [provider]  omeprazole (PRILOSEC) 40 MG capsule Take 1 capsule (40 mg total) by mouth daily. 12/23/16  Yes Renato Shin, MD  Resveratrol 100 MG CAPS Take 100 mg by mouth daily.   Yes [provider]  rosuvastatin (CRESTOR) 40 MG tablet Take 20 mg by mouth every evening.   Yes [provider]  Timolol Maleate 0.5 % (DAILY) SOLN Place 1 drop into both eyes 2 (two) times daily.   Yes [provider]  traMADol (ULTRAM) 50 MG tablet Take 2 tablets (100 mg total) by mouth 2 (two) times daily as needed (pain). 05/06/17  Yes Renato Shin, MD  travoprost, benzalkonium, (TRAVATAN) 0.004 % ophthalmic solution Place 1 drop into the left eye at bedtime.   Yes [provider]    Family History Family History  Problem Relation Age  of Onset  . Liver cancer Mother        deceased age 68  . Cancer Mother        liver cancer  . Heart attack Father   . Colon cancer Neg Hx     Social History Social History  Substance Use Topics  . Smoking status: Former Smoker    Packs/day: 2.00    Years: 50.00    Types: Cigarettes    Quit date: 12/08/2006  . Smokeless tobacco: Never Used  . Alcohol use No     Comment: quit 3 years ago     Allergies   Actos [pioglitazone] and Timolol   Review of Systems Review of Systems  Constitutional: Positive for chills.  Respiratory: Positive for cough and shortness of breath.   Cardiovascular: Positive for chest pain.  Gastrointestinal: Negative for abdominal pain, diarrhea, nausea and vomiting.  All other systems reviewed and are negative.    Physical Exam Updated Vital Signs BP 100/84   Pulse 74   Resp (!) 23   Ht 5\' 10"  (1.778 m)   Wt (!) 142 kg (313 lb)   SpO2 95%   BMI 44.91 kg/m   Physical Exam  Constitutional: He appears well-developed and well-nourished. No distress.  HENT:  Head: Normocephalic and atraumatic.  Eyes: Conjunctivae are normal.  Neck: Neck supple.  Cardiovascular: Normal rate, regular rhythm, normal heart sounds and intact distal pulses.   Pulses in the extremities are equal bilaterally.  Pulmonary/Chest: Tachypnea noted. He has decreased breath sounds.  Increased work of breathing. Initial SP02 with my exam was 95% on room air.  Abdominal: Soft. There is no tenderness. There is no guarding.  Musculoskeletal: He exhibits no edema.  Lymphadenopathy:    He has no cervical adenopathy.  Neurological: He is alert.  Skin: Skin is warm and dry. He is not diaphoretic.  Psychiatric: He has a normal mood  and affect. His behavior is normal.  Nursing note and vitals reviewed.    ED Treatments / Results  Labs (all labs ordered are listed, but only abnormal results are displayed) Labs Reviewed  BASIC METABOLIC PANEL - Abnormal; Notable for the  following:       Result Value   Glucose, Bld 390 (*)    BUN 35 (*)    Creatinine, Ser 1.77 (*)    GFR calc non Af Amer 36 (*)    GFR calc Af Amer 42 (*)    All other components within normal limits  CBC - Abnormal; Notable for the following:    WBC 19.1 (*)    RBC 3.95 (*)    Hemoglobin 11.9 (*)    HCT 37.6 (*)    All other components within normal limits  BRAIN NATRIURETIC PEPTIDE - Abnormal; Notable for the following:    B Natriuretic Peptide 173.7 (*)    All other components within normal limits  CULTURE, BLOOD (ROUTINE X 2)  CULTURE, BLOOD (ROUTINE X 2)  I-STAT TROPONIN, ED  I-STAT CG4 LACTIC ACID, ED    EKG  EKG Interpretation  Date/Time:  Tuesday July 28 2017 15:31:27 EDT Ventricular Rate:  77 PR Interval:    QRS Duration: 169 QT Interval:  483 QTC Calculation: 547 R Axis:   -92 Text Interpretation:  Atrial-ventricular dual-paced rhythm No further analysis attempted due to paced rhythm Confirmed by Virgel Manifold [phone]) on 07/28/2017 3:52:29 PM       Radiology Dg Chest 2 View  Result Date: 07/28/2017 CLINICAL DATA:  One day history of chest tightness, shortness of breath, and cough. History of CHF, coronary artery disease, COPD. Former smoker. EXAM: CHEST  2 VIEW COMPARISON:  PA and lateral chest x-ray of November 04, 2016 FINDINGS: The lungs are well-expanded. The interstitial markings are coarse. The cardiac silhouette is enlarged. The central pulmonary vascularity is prominent. There is no pleural effusion. There is calcification in the wall of the aortic arch. The ICD is in stable position. The observed bony thorax is unremarkable. IMPRESSION: COPD. Superimposed low-grade CHF. No alveolar pneumonia nor significant pleural effusion. Thoracic aortic atherosclerosis. Electronically Signed   By: David  Martinique M.D.   On: 07/28/2017 16:03    Procedures Procedures (including critical care time)  Medications Ordered in ED Medications  cefTRIAXone (ROCEPHIN) 1 g in  dextrose 5 % 50 mL IVPB (not administered)  azithromycin (ZITHROMAX) 500 mg in dextrose 5 % 250 mL IVPB (not administered)  ipratropium-albuterol (DUONEB) 0.5-2.5 (3) MG/3ML nebulizer solution 3 mL (3 mLs Nebulization Given 07/28/17 1729)  methylPREDNISolone sodium succinate (SOLU-MEDROL) 125 mg/2 mL injection 80 mg (80 mg Intravenous Given 07/28/17 1728)     Initial Impression / Assessment and Plan / ED Course  I have reviewed the triage vital signs and the nursing notes.  Pertinent labs & imaging results that were available during my care of the patient were reviewed by me and considered in my medical decision making (see chart for details).  Clinical Course as of Jul 28 1834  Tue Jul 28, 2017  1820 Patient voices some improvement in his shortness of breath following the DuoNeb. Lung sounds still diminished.  [SJ]  [id number] Spoke with Dr. Lorin Mercy, hospitalist, who agreed to admit the patient.   [SJ]    Clinical Course User Index [SJ] Dortha Neighbors C, PA-C    Patient presents with shortness of breath. Suspect COPD exacerbation from infectious source. New oxygen requirement.  Findings and plan of  care discussed with Virgel Manifold, MD.   Vitals:   07/28/17 1545 07/28/17 1600 07/28/17 1615 07/28/17 1630  BP: 122/66 (!) 136/49 (!) 123/45 100/84  Pulse: 72 77 71 74  Resp: (!) 22 18 20  (!) 23  SpO2: 96% 96% 96% 95%  Weight:      Height:         Final Clinical Impressions(s) / ED Diagnoses   Final diagnoses:  Shortness of breath    New Prescriptions New Prescriptions   No medications on file     Layla Maw 07/28/17 Chapman Fitch, MD 08/14/17 1102

## 2017-07-28 NOTE — ED Notes (Addendum)
Went into room to find pt sitting at the foot of the bed, very short of breath. Wife at bedside stated she treid to help him with the urinal. Pt and wife made aware that if pt needs help to push call button. Pt and wife stated understanding.

## 2017-07-28 NOTE — Progress Notes (Signed)
CRITICAL VALUE ALERT  Critical Value:  Glucose 626, lactic 3.0  Date & Time Notied:  07/28/2017 @ 2342  Provider Notified: schoor  Orders Received/Actions taken: awaiting response

## 2017-07-28 NOTE — Progress Notes (Signed)
CRITICAL VALUE ALERT  Critical Value:  Lactic acid  3.6  Date & Time Notied:  07/28/2017 @2130   Provider Notified: schoor  Orders Received/Actions taken: NS bolus 500

## 2017-07-29 ENCOUNTER — Encounter (HOSPITAL_COMMUNITY): Payer: Self-pay | Admitting: Internal Medicine

## 2017-07-29 DIAGNOSIS — R6520 Severe sepsis without septic shock: Secondary | ICD-10-CM

## 2017-07-29 DIAGNOSIS — R739 Hyperglycemia, unspecified: Secondary | ICD-10-CM

## 2017-07-29 DIAGNOSIS — J441 Chronic obstructive pulmonary disease with (acute) exacerbation: Principal | ICD-10-CM

## 2017-07-29 DIAGNOSIS — R0602 Shortness of breath: Secondary | ICD-10-CM

## 2017-07-29 DIAGNOSIS — A419 Sepsis, unspecified organism: Secondary | ICD-10-CM | POA: Diagnosis present

## 2017-07-29 DIAGNOSIS — N183 Chronic kidney disease, stage 3 (moderate): Secondary | ICD-10-CM

## 2017-07-29 DIAGNOSIS — E669 Obesity, unspecified: Secondary | ICD-10-CM

## 2017-07-29 DIAGNOSIS — E1169 Type 2 diabetes mellitus with other specified complication: Secondary | ICD-10-CM

## 2017-07-29 LAB — URINALYSIS, ROUTINE W REFLEX MICROSCOPIC
Bacteria, UA: NONE SEEN
Bilirubin Urine: NEGATIVE
Glucose, UA: >=500 mg/dL — AB
Hgb urine dipstick: NEGATIVE
Ketones, ur: NEGATIVE mg/dL
Leukocytes, UA: NEGATIVE
Nitrite: NEGATIVE
Protein, ur: NEGATIVE mg/dL
Specific Gravity, Urine: 1.017 (ref 1.005–1.030)
pH: 5 (ref 5.0–8.0)

## 2017-07-29 LAB — BASIC METABOLIC PANEL
Anion gap: 13 (ref 5–15)
Anion gap: 17 — ABNORMAL HIGH (ref 5–15)
Anion gap: 12 (ref 5–15)
Anion gap: 10 (ref 5–15)
BUN: 39 mg/dL — ABNORMAL HIGH (ref 6–20)
BUN: 39 mg/dL — ABNORMAL HIGH (ref 6–20)
BUN: 39 mg/dL — ABNORMAL HIGH (ref 6–20)
BUN: 40 mg/dL — ABNORMAL HIGH (ref 6–20)
CO2: 17 mmol/L — ABNORMAL LOW (ref 22–32)
CO2: 15 mmol/L — ABNORMAL LOW (ref 22–32)
CO2: 18 mmol/L — ABNORMAL LOW (ref 22–32)
CO2: 19 mmol/L — ABNORMAL LOW (ref 22–32)
Calcium: 8.4 mg/dL — ABNORMAL LOW (ref 8.9–10.3)
Calcium: 8.4 mg/dL — ABNORMAL LOW (ref 8.9–10.3)
Calcium: 8.4 mg/dL — ABNORMAL LOW (ref 8.9–10.3)
Calcium: 8.4 mg/dL — ABNORMAL LOW (ref 8.9–10.3)
Chloride: 103 mmol/L (ref 101–111)
Chloride: 102 mmol/L (ref 101–111)
Chloride: 104 mmol/L (ref 101–111)
Chloride: 106 mmol/L (ref 101–111)
Creatinine, Ser: 2.06 mg/dL — ABNORMAL HIGH (ref 0.61–1.24)
Creatinine, Ser: 2.03 mg/dL — ABNORMAL HIGH (ref 0.61–1.24)
Creatinine, Ser: 1.83 mg/dL — ABNORMAL HIGH (ref 0.61–1.24)
Creatinine, Ser: 1.81 mg/dL — ABNORMAL HIGH (ref 0.61–1.24)
GFR calc Af Amer: 35 mL/min — ABNORMAL LOW (ref >60)
GFR calc Af Amer: 35 mL/min — ABNORMAL LOW (ref >60)
GFR calc Af Amer: 40 mL/min — ABNORMAL LOW (ref >60)
GFR calc Af Amer: 41 mL/min — ABNORMAL LOW (ref >60)
GFR calc non Af Amer: 30 mL/min — ABNORMAL LOW (ref >60)
GFR calc non Af Amer: 31 mL/min — ABNORMAL LOW (ref >60)
GFR calc non Af Amer: 35 mL/min — ABNORMAL LOW (ref >60)
GFR calc non Af Amer: 35 mL/min — ABNORMAL LOW (ref >60)
Glucose, Bld: 572 mg/dL (ref 65–99)
Glucose, Bld: 470 mg/dL — ABNORMAL HIGH (ref 65–99)
Glucose, Bld: 338 mg/dL — ABNORMAL HIGH (ref 65–99)
Glucose, Bld: 250 mg/dL — ABNORMAL HIGH (ref 65–99)
Potassium: 4 mmol/L (ref 3.5–5.1)
Potassium: 3.9 mmol/L (ref 3.5–5.1)
Potassium: 4 mmol/L (ref 3.5–5.1)
Potassium: 4.1 mmol/L (ref 3.5–5.1)
Sodium: 133 mmol/L — ABNORMAL LOW (ref 135–145)
Sodium: 134 mmol/L — ABNORMAL LOW (ref 135–145)
Sodium: 134 mmol/L — ABNORMAL LOW (ref 135–145)
Sodium: 135 mmol/L (ref 135–145)

## 2017-07-29 LAB — GLUCOSE, CAPILLARY
GLUCOSE-CAPILLARY: 545 mg/dL — AB (ref 65–99)
GLUCOSE-CAPILLARY: 500 mg/dL — AB (ref 65–99)
GLUCOSE-CAPILLARY: 437 mg/dL — AB (ref 65–99)
GLUCOSE-CAPILLARY: 233 mg/dL — AB (ref 65–99)
GLUCOSE-CAPILLARY: 224 mg/dL — AB (ref 65–99)
Glucose-Capillary: 552 mg/dL (ref 65–99)
Glucose-Capillary: 558 mg/dL (ref 65–99)
Glucose-Capillary: 364 mg/dL — ABNORMAL HIGH (ref 65–99)
Glucose-Capillary: 364 mg/dL — ABNORMAL HIGH (ref 65–99)
Glucose-Capillary: 362 mg/dL — ABNORMAL HIGH (ref 65–99)
Glucose-Capillary: 270 mg/dL — ABNORMAL HIGH (ref 65–99)
Glucose-Capillary: 277 mg/dL — ABNORMAL HIGH (ref 65–99)
Glucose-Capillary: 150 mg/dL — ABNORMAL HIGH (ref 65–99)
Glucose-Capillary: 162 mg/dL — ABNORMAL HIGH (ref 65–99)
Glucose-Capillary: 155 mg/dL — ABNORMAL HIGH (ref 65–99)

## 2017-07-29 LAB — LACTIC ACID, PLASMA
LACTIC ACID, VENOUS: 3.8 mmol/L — AB (ref 0.5–1.9)
Lactic Acid, Venous: 4.4 mmol/L (ref 0.5–1.9)

## 2017-07-29 LAB — TROPONIN I: TROPONIN I: 0.05 ng/mL — AB (ref <0.03)

## 2017-07-29 LAB — MRSA PCR SCREENING: MRSA by PCR: POSITIVE — AB

## 2017-07-29 LAB — CBC
HCT: 36 % — ABNORMAL LOW (ref 39.0–52.0)
Hemoglobin: 11.6 g/dL — ABNORMAL LOW (ref 13.0–17.0)
MCH: 30.6 pg (ref 26.0–34.0)
MCHC: 32.2 g/dL (ref 30.0–36.0)
MCV: 95 fL (ref 78.0–100.0)
Platelets: 192 10*3/uL (ref 150–400)
RBC: 3.79 MIL/uL — ABNORMAL LOW (ref 4.22–5.81)
RDW: 15.9 % — ABNORMAL HIGH (ref 11.5–15.5)
WBC: 18.5 10*3/uL — ABNORMAL HIGH (ref 4.0–10.5)

## 2017-07-29 MED ORDER — CHLORHEXIDINE GLUCONATE 0.12 % MT SOLN
15.0000 mL | Freq: Two times a day (BID) | OROMUCOSAL | Status: DC
  Administered 2017-07-29 – 2017-07-31 (×5): 15 mL via OROMUCOSAL
  Filled 2017-07-29 (×5): qty 15

## 2017-07-29 MED ORDER — MUPIROCIN 2 % EX OINT
1.0000 "application " | TOPICAL_OINTMENT | Freq: Two times a day (BID) | CUTANEOUS | Status: DC
  Administered 2017-07-29 – 2017-07-31 (×5): 1 via NASAL
  Filled 2017-07-29 (×2): qty 22

## 2017-07-29 MED ORDER — INSULIN ASPART 100 UNIT/ML ~~LOC~~ SOLN
240.0000 [IU] | Freq: Every day | SUBCUTANEOUS | Status: DC
  Administered 2017-07-29 – 2017-07-31 (×3): 240 [IU] via SUBCUTANEOUS

## 2017-07-29 MED ORDER — INSULIN ASPART 100 UNIT/ML ~~LOC~~ SOLN: 150.0000 [IU] | Freq: Four times a day (QID) | SUBCUTANEOUS | Status: DC

## 2017-07-29 MED ORDER — FLUTICASONE PROPIONATE 50 MCG/ACT NA SUSP
1.0000 | Freq: Two times a day (BID) | NASAL | Status: DC
  Administered 2017-07-29 – 2017-07-31 (×5): 1 via NASAL
  Filled 2017-07-29: qty 16

## 2017-07-29 MED ORDER — SODIUM CHLORIDE 0.9 % IV SOLN: INTRAVENOUS | Status: DC

## 2017-07-29 MED ORDER — ORAL CARE MOUTH RINSE
15.0000 mL | Freq: Two times a day (BID) | OROMUCOSAL | Status: DC
  Administered 2017-07-30 – 2017-07-31 (×3): 15 mL via OROMUCOSAL

## 2017-07-29 MED ORDER — DEXTROSE-NACL 5-0.45 % IV SOLN: INTRAVENOUS | Status: DC

## 2017-07-29 MED ORDER — INSULIN ASPART 100 UNIT/ML ~~LOC~~ SOLN
300.0000 [IU] | Freq: Every day | SUBCUTANEOUS | Status: DC
  Administered 2017-07-31: 300 [IU] via SUBCUTANEOUS
  Filled 2017-07-29: qty 3

## 2017-07-29 MED ORDER — CHLORHEXIDINE GLUCONATE CLOTH 2 % EX PADS
6.0000 | MEDICATED_PAD | Freq: Every day | CUTANEOUS | Status: DC
  Administered 2017-07-30 – 2017-07-31 (×2): 6 via TOPICAL

## 2017-07-29 MED ORDER — INSULIN ASPART 100 UNIT/ML ~~LOC~~ SOLN
220.0000 [IU] | Freq: Every day | SUBCUTANEOUS | Status: DC
  Administered 2017-07-29 – 2017-07-30 (×2): 220 [IU] via SUBCUTANEOUS

## 2017-07-29 MED ORDER — INSULIN ASPART 100 UNIT/ML ~~LOC~~ SOLN
150.0000 [IU] | Freq: Every day | SUBCUTANEOUS | Status: DC
  Administered 2017-07-29: 150 [IU] via SUBCUTANEOUS

## 2017-07-29 MED ORDER — ADULT MULTIVITAMIN W/MINERALS CH
1.0000 | ORAL_TABLET | Freq: Every day | ORAL | Status: DC
  Administered 2017-07-29 – 2017-07-31 (×3): 1 via ORAL
  Filled 2017-07-29 (×3): qty 1

## 2017-07-29 MED ORDER — SODIUM CHLORIDE 0.9 % IV SOLN
Freq: Once | INTRAVENOUS | Status: AC
  Administered 2017-07-29: 01:00:00 via INTRAVENOUS

## 2017-07-29 MED ORDER — DOCUSATE SODIUM 100 MG PO CAPS: 100.0000 mg | ORAL_CAPSULE | Freq: Every day | ORAL | Status: DC | PRN

## 2017-07-29 MED ORDER — SODIUM CHLORIDE 0.9 % IV SOLN
INTRAVENOUS | Status: DC
  Administered 2017-07-29: 1.8 [IU]/h via INTRAVENOUS
  Administered 2017-07-29: 09:00:00 via INTRAVENOUS
  Administered 2017-07-29: 5.4 [IU]/h via INTRAVENOUS
  Filled 2017-07-29 (×4): qty 1

## 2017-07-29 MED ORDER — VITAMIN D 1000 UNITS PO TABS
2000.0000 [IU] | ORAL_TABLET | Freq: Every day | ORAL | Status: DC
  Administered 2017-07-29 – 2017-07-31 (×3): 2000 [IU] via ORAL
  Filled 2017-07-29 (×3): qty 2

## 2017-07-29 NOTE — Progress Notes (Signed)
Pt was admitted from ED per stretcher accompanied by a nurse tech and pt family on arrival to the floor pt was alert and oriented self introduced to pt and family ID bracelet verrified with the pt, vital signs are stable, fall risk assessment done, skin checked with another nurse, pt has stage 1 at his buttocks, skin is dry and flaky, walk with walker, DNR and yellow bracelet on the rt hand, pt CBG 600, lactic acid 3.7, troponin 0.05 Schoor was paged, put an order for BMP drawn, blood Glucose was 626, lactic acid 3.0 paged Schoor again put an order to transfer pt to 25mw, pt has been successfully transfer to [id number],  all his daily medications at the pyxis and pt belongings sent together with pt.

## 2017-07-29 NOTE — Progress Notes (Signed)
CRITICAL VALUE ALERT  Critical Value: Lactic acid 4.4  Date & Time Notied: 8/22 1230  Provider Notified: Dr Ree Kida  Orders Received/Actions taken: 1245

## 2017-07-29 NOTE — Evaluation (Signed)
Physical Therapy Evaluation Patient Details Name: [name] MRN: [id number] DOB: Jan 10, 1941 Today's Date: 07/29/2017   History of Present Illness  Pt admitted with SOB, chest pressure and congestion. PMH: CAD, pacemaker, CKD, afib, OSA, morbid obesity, HTN, glaucoma, CHF, DM, COPD, bladder cancer, peripheral neuropathy.  Clinical Impression  Pt functioning near baseline. Pt sedentary at baseline with low activity tolerance. Minimal community ambulation at baseline. Pt SPo2 did drop to 83% on 2Lo2 via [place]. Acute PT to follow.    Follow Up Recommendations No PT follow up;Supervision - Intermittent    Equipment Recommendations  None recommended by PT    Recommendations for Other Services       Precautions / Restrictions Precautions Precautions: Fall Precaution Comments: watch 02 sats Restrictions Weight Bearing Restrictions: No      Mobility  Bed Mobility Overal bed mobility: Needs Assistance Bed Mobility: Rolling;Sidelying to Sit Rolling: Supervision Sidelying to sit: Supervision       General bed mobility comments: increased time, heavy reliance on rail, HOB up  Transfers Overall transfer level: Needs assistance Equipment used: Rolling walker (2 wheeled) Transfers: Sit to/from Stand Sit to Stand: Min guard         General transfer comment: v/c's for safe hand placement to not pull up on walker  Ambulation/Gait Ambulation/Gait assistance: Min guard;+2 safety/equipment (O2 tank and line management) Ambulation Distance (Feet): 50 Feet Assistive device: Rolling walker (2 wheeled) Gait Pattern/deviations: Step-through pattern;Decreased stride length Gait velocity: slow Gait velocity interpretation: Below normal speed for age/gender General Gait Details: + SOB, SPO2 dec to 83% on 2Lo2 via [place] and then increased into 90s with purse liped breathing within a minute. pt reports he doens't walk more than that at home PTA. 3 standing rest breaks  Stairs             Wheelchair Mobility    Modified Rankin (Stroke Patients Only)       Balance Overall balance assessment: Needs assistance   Sitting balance-Leahy Scale: Good       Standing balance-Leahy Scale: Poor Standing balance comment: reliant on walker to ambulate and at least one hand support when pulling up pants                             Pertinent Vitals/Pain Pain Assessment: No/denies pain    Home Living Family/patient expects to be discharged to:: Private residence Living Arrangements: Spouse/significant other Available Help at Discharge: Family;Available 24 hours/day Type of Home: House Home Access: Stairs to enter Entrance Stairs-Rails: Right Entrance Stairs-Number of Steps: 4 Home Layout: One level Home Equipment: Walker - 2 wheels;Walker - 4 wheels;Cane - single point;Shower seat;Hand held shower head Additional Comments: sleeps in a recliner    Prior Function Level of Independence: Needs assistance   Gait / Transfers Assistance Needed: uses a cane and furniture inside and RW out  ADL's / Homemaking Assistance Needed: wife helps with LB bathing and dressing, sits to shower  Comments: wife and pt share cooking activities     Hand Dominance   Dominant Hand: Left    Extremity/Trunk Assessment   Upper Extremity Assessment Upper Extremity Assessment: Defer to OT evaluation    Lower Extremity Assessment Lower Extremity Assessment: Generalized weakness    Cervical / Trunk Assessment Cervical / Trunk Assessment: Normal  Communication   Communication: HOH  Cognition Arousal/Alertness: Awake/alert Behavior During Therapy: WFL for tasks assessed/performed Overall Cognitive Status: Within Functional Limits for tasks assessed  General Comments General comments (skin integrity, edema, etc.): bilat LE discoloration    Exercises     Assessment/Plan    PT Assessment Patient needs continued  PT services  PT Problem List Decreased strength;Decreased activity tolerance;Decreased balance;Decreased mobility       PT Treatment Interventions DME instruction;Gait training;Stair training;Functional mobility training;Therapeutic activities;Therapeutic exercise;Balance training    PT Goals (Current goals can be found in the Care Plan section)  Acute Rehab PT Goals Patient Stated Goal: to return home with wife PT Goal Formulation: With patient Time For Goal Achievement: 08/05/17 Potential to Achieve Goals: Good    Frequency Min 3X/week   Barriers to discharge        Co-evaluation PT/OT/SLP Co-Evaluation/Treatment: Yes Reason for Co-Treatment: For patient/therapist safety PT goals addressed during session: Mobility/safety with mobility OT goals addressed during session: ADL's and self-care       AM-PAC PT "6 Clicks" Daily Activity  Outcome Measure Difficulty turning over in bed (including adjusting bedclothes, sheets and blankets)?: Unable Difficulty moving from lying on back to sitting on the side of the bed? : Unable Difficulty sitting down on and standing up from a chair with arms (e.g., wheelchair, bedside commode, etc,.)?: Unable Help needed moving to and from a bed to chair (including a wheelchair)?: A Little Help needed walking in hospital room?: A Little Help needed climbing 3-5 steps with a railing? : A Lot 6 Click Score: 11    End of Session Equipment Utilized During Treatment: Oxygen (2LO2 via [place]) Activity Tolerance: Patient tolerated treatment well;Patient limited by fatigue Patient left: in chair;with call bell/phone within reach;with family/visitor present Nurse Communication: Mobility status PT Visit Diagnosis: Unsteadiness on feet (R26.81)    Time: 1779-3903 PT Time Calculation (min) (ACUTE ONLY): 30 min   Charges:   PT Evaluation $PT Eval Moderate Complexity: 1 Mod     PT G CodesKittie Plater, PT, DPT Pager #: [phone] Office #:  [phone]   [place] 07/29/2017, 12:16 PM

## 2017-07-29 NOTE — Progress Notes (Addendum)
Inpatient Diabetes Program Recommendations  AACE/ADA: New Consensus Statement on Inpatient Glycemic Control (2015)  Target Ranges:  Prepandial:   less than 140 mg/dL      Peak postprandial:   less than 180 mg/dL (1-2 hours)      Critically ill patients:  140 - 180 mg/dL   Lab Results  Component Value Date   GLUCAP 364 (H) 07/29/2017   HGBA1C 7.5 05/06/2017    Review of Glycemic ControlResults for [name], [name] (MRN [id number]) as of 07/29/2017 09:09  Ref. Range 07/29/2017 04:39 07/29/2017 04:41 07/29/2017 05:57 07/29/2017 06:55 07/29/2017 08:10  Glucose-Capillary Latest Ref Range: 65 - 99 mg/dL 558 (HH) 545 (HH) 500 (H) 437 (H) 364 (H)   Diabetes history:  Type 2 diabetes Outpatient Diabetes medications:  Novolin R-300 units q AM, 240 units at noon, 220 units with supper, and 150 units q HS Current orders for Inpatient glycemic control:  IV insulin  Inpatient Diabetes Program Recommendations:    Agree with orders for IV insulin at this point with IV steroids.  Patient is on very high doses of insulin at home.  According to pharmacy, patient had been on U500 however it was not being covered by insurance therefore patient taking Regular insulin instead.  Patient was placed on IV insulin overnight due to hyperglycemia.  Blood sugars now down to 364 mg/dL.    Once blood sugars less than 200 mg/dL, consider starting Levemir 100 units bid (give first dose 2 hours prior to d/c of insulin drip).  Further please add Novolog meal coverage 40 units tid with meals along with resistant coverage q 4 hours.  Would not resume home regimen in the hospital and instead try basal/bolus approach.  With his high insulin needs, U500 would be more optimal in order to decrease the volume of insulin patient is receiving.  Will discuss with patient as well.    Thanks, Adah Perl, RN, BC-ADM Inpatient Diabetes Coordinator Pager [phone] (8a-5p)   10:30a Spoke with patient and MD regarding patient's insulin  regimen.  Patient states that he does consistently take these large doses of insulin at home.  He does occasionally have low blood sugars.  He states that blood sugars are usually high in the mornings and decrease throughout the day after giving Novolin R insulin.  Blood sugars are trending down on IV insulin.  Discussed with Dr. Barbaraann Rondo and she states that patient worried about blood sugars and wants to be on home insulin.  I recommended not using insulin drip with SQ regular insulin but instead transitioning patient off IV insulin prior to starting home regimen. Discussed also with RN and pharmacy.  Will follow.

## 2017-07-29 NOTE — Progress Notes (Signed)
PROGRESS NOTE    [name]  GHW:[id number] DOB: 06/19/1941 DOA: 07/28/2017 PCP: Renato Shin, MD   Chief Complaint  Patient presents with  . Shortness of Breath  . Chest Pain    Brief Narrative:  HPI On 07/28/2017 by Dr. Karmen Bongo [name] is a 76 y.o. male with medical history significant of pacemaker placement; CKD; afib on anticoagulation; OSA on CPAP; morbid obesity; hypothyroidism; HTN; glaucoma; chronic diastolic heart failure; DM; COPD; bladder cancer; and CAD presenting with SOB.  This morning when he woke up he was SOB, lots of congestion that he couldn't cough up, was panting for breath.  His left arm was hurting a little bit.  Felt like a lot of pressure in his chest.  +chills, shaking like someone having a seizure, no fever.  He felt normal when he went to bed last night.  Slightly productive cough with clear sputum.  No recent sick contacts - he had the flu right after Christmas.  His face has felt very hot, like it's flushed.  He feels very thirsty, with a dry mouth.  He has felt like a lot of tightness and congestion and pressure that is hard to cough up, hard to get a deep breath, brething is very shallow.  He did get NTG en route without improvement but it caused nausea.  Breathing treatment did help some.  He does not wear O2 at home, just CPAP qhs.  He did use Albuterol at home with some improvement.  He takes Mucinex for congestion.  He was previously on a different kind of inhaler for a while but he stopped using it because it wasn't working.  Assessment & Plan   Acute on chronic respiratory failure/COPD exacerbation -CXR showed COPD, superimposed low grade CHF. No pneumonia  -Continue nebulizer treatments, Solu-Medrol, doxycycline, antitussives -Will change Flonase to twice daily  Hyperglycemia/ Uncontrolled Diabetes Mellitus, type II  -Per patient he takes about 900 unites of insulin per day and feels he and his outpatient physician have it  controlled -Blood glucose/CBGs have been consistently >500 -Patient was placed on glucostabilizer overnight with IVF -Currently does not have elevated anion gap, however, has mild metabolic acidosis (CO2 18) -have restarted patient's home regimen of insulin -He should be on a long acting/basal insulin -Will continue to monitor CBG hourly -If needed will add on insulin -Diabetes coordinator consulted and appreciated  Leukocytosis -Chest x-ray showed no infection, pending UA -Likely reactive, suspect secondary to respiratory failure with hyperglycemia -Continue to monitor CBC  Lactic acidosis -Likely secondary to the above -Continue IV fluid hydration and monitor  Chronic kidney disease, stage III -Creatinine currently 1.83, near baseline -Continue to monitor BMP  ?Chronic diastolic heart failure -Last echocardiogram in 2015 note an EF of 55% -BNP 173.7 -Given hyperglycemia, will hold Lasix at this time -Currently patient does appear to be euvolemic -Monitor intake and output, daily weights  Mildly elevated troponin -Troponin 0.05, no reports of chest pain -Likely secondary to respiratory failure in the setting of chronic kidney disease, stage III -Will continue to trend  Hypotension -will place holding parameters on metoprolol -hold lasix   Paroxysmal atrial fibrillation -Continue Eliquis, amiodarone, metoprolol  Hypothyroidism -continue synthroid  Hyperlipidemia -Continue statin, Lovaza  DVT Prophylaxis  Eliquis   Code Status: DNR  Family Communication: None at bedside   Disposition Plan: Admitted, continue to monitor in stepdown  Consultants None  Procedures  None  Antibiotics   Anti-infectives    Start  Dose/Rate Route Frequency Ordered Stop   07/29/17 0600  doxycycline (VIBRAMYCIN) 100 mg in dextrose 5 % 250 mL IVPB     100 mg 125 mL/hr over 120 Minutes Intravenous Every 12 hours 07/28/17 2021     07/28/17 1745  cefTRIAXone (ROCEPHIN) 1 g in  dextrose 5 % 50 mL IVPB     1 g 100 mL/hr over 30 Minutes Intravenous  Once 07/28/17 1730 07/28/17 2004   07/28/17 1745  azithromycin (ZITHROMAX) 500 mg in dextrose 5 % 250 mL IVPB     500 mg 250 mL/hr over 60 Minutes Intravenous  Once 07/28/17 1730 07/28/17 2054      Subjective:   [name] seen and examined today.  Patient feels breathing has mildly improved, but not back to his baseline. Denies chest pain. Continues to have productive cough with clear sputum production. Patient very worried about his blood sugars and insulin needs. Denies abdominal pain, nausea or vomiting, diarrhea or constipation, dizziness or headache.  Objective:   Vitals:   07/29/17 0600 07/29/17 0804 07/29/17 0920 07/29/17 1148  BP: (!) 100/48 (!) 118/46  (!) 97/42  Pulse: 68 71 82 71  Resp: 16 15 16 18   Temp:  97.6 F (36.4 C)  (!) 97.5 F (36.4 C)  TempSrc:  Oral  Oral  SpO2: 92% 100% 94% 96%  Weight:      Height:        Intake/Output Summary (Last 24 hours) at 07/29/17 1244 Last data filed at 07/29/17 0601  Gross per 24 hour  Intake           908.07 ml  Output              600 ml  Net           308.07 ml   Filed Weights   07/28/17 1529  Weight: (!) 142 kg (313 lb)    Exam  General: Well developed, well nourished, NAD, appears stated age  HEENT: NCAT, mucous membranes moist.   Cardiovascular: S1 S2 auscultated, soft murmur, RRR  Respiratory: Diminished breath sounds, mild exp wheezing   Abdomen: Soft, obese, nontender, nondistended, + bowel sounds  Extremities: warm dry without cyanosis clubbing. LE edema with venous stasis changes  Neuro: AAOx3, nonfocal  Psych: Normal affect and demeanor with intact judgement and insight   Data Reviewed: I have personally reviewed following labs and imaging studies  CBC:  Recent Labs Lab 07/28/17 1530 07/29/17 0430  WBC 19.1* 18.5*  HGB 11.9* 11.6*  HCT 37.6* 36.0*  MCV 95.2 95.0  PLT 208 932   Basic Metabolic Panel:  Recent  Labs Lab 07/28/17 1530 07/28/17 2242 07/29/17 0430 07/29/17 0618 07/29/17 1027  NA 137 132* 133* 134* 134*  K 4.5 5.0 4.0 3.9 4.0  CL 103 101 103 102 104  CO2 22 18* 17* 15* 18*  GLUCOSE 390* 626* 572* 470* 338*  BUN 35* 36* 39* 39* 39*  CREATININE 1.77* 1.99* 2.06* 2.03* 1.83*  CALCIUM 9.0 8.2* 8.4* 8.4* 8.4*   GFR: Estimated Creatinine Clearance: 50.4 mL/min (A) (by C-G formula based on SCr of 1.83 mg/dL (H)). Liver Function Tests: No results for input(s): AST, ALT, ALKPHOS, BILITOT, PROT, ALBUMIN in the last 168 hours. No results for input(s): LIPASE, AMYLASE in the last 168 hours. No results for input(s): AMMONIA in the last 168 hours. Coagulation Profile:  Recent Labs Lab 07/28/17 2030  INR 1.33   Cardiac Enzymes:  Recent Labs Lab 07/28/17 2030  TROPONINI  0.05*   BNP (last 3 results) No results for input(s): PROBNP in the last 8760 hours. HbA1C: No results for input(s): HGBA1C in the last 72 hours. CBG:  Recent Labs Lab 07/29/17 0810 07/29/17 0927 07/29/17 1004 07/29/17 1147 07/29/17 1212  GLUCAP 364* 364* 362* 277* 270*   Lipid Profile: No results for input(s): CHOL, HDL, LDLCALC, TRIG, CHOLHDL, LDLDIRECT in the last 72 hours. Thyroid Function Tests: No results for input(s): TSH, T4TOTAL, FREET4, T3FREE, THYROIDAB in the last 72 hours. Anemia Panel: No results for input(s): VITAMINB12, FOLATE, FERRITIN, TIBC, IRON, RETICCTPCT in the last 72 hours. Urine analysis:    Component Value Date/Time   COLORURINE RED (A) 10/01/2013 1003   APPEARANCEUR TURBID (A) 10/01/2013 1003   LABSPEC 1.022 10/01/2013 1003   PHURINE 5.5 10/01/2013 1003   GLUCOSEU 100 (A) 10/01/2013 1003   GLUCOSEU 100 09/30/2013 1535   HGBUR LARGE (A) 10/01/2013 1003   BILIRUBINUR MODERATE (A) 10/01/2013 1003   KETONESUR 15 (A) 10/01/2013 1003   PROTEINUR >300 (A) 10/01/2013 1003   UROBILINOGEN 1.0 10/01/2013 1003   NITRITE NEGATIVE 10/01/2013 1003   LEUKOCYTESUR MODERATE (A)  10/01/2013 1003   Sepsis Labs: @LABRCNTIP (procalcitonin:4,lacticidven:4)  ) Recent Results (from the past 240 hour(s))  Culture, blood (routine x 2)     Status: None (Preliminary result)   Collection Time: 07/28/17  5:50 PM  Result Value Ref Range Status   Specimen Description BLOOD RIGHT HAND  Final   Special Requests   Final    BOTTLES DRAWN AEROBIC AND ANAEROBIC Blood Culture adequate volume   Culture NO GROWTH < 24 HOURS  Final   Report Status PENDING  Incomplete  Culture, blood (routine x 2)     Status: None (Preliminary result)   Collection Time: 07/28/17  6:02 PM  Result Value Ref Range Status   Specimen Description BLOOD RIGHT FOREARM  Final   Special Requests   Final    BOTTLES DRAWN AEROBIC AND ANAEROBIC Blood Culture adequate volume   Culture NO GROWTH < 24 HOURS  Final   Report Status PENDING  Incomplete  MRSA PCR Screening     Status: Abnormal   Collection Time: 07/29/17  1:07 AM  Result Value Ref Range Status   MRSA by PCR POSITIVE (A) NEGATIVE Final    Comment:        The GeneXpert MRSA Assay (FDA approved for NASAL specimens only), is one component of a comprehensive MRSA colonization surveillance program. It is not intended to diagnose MRSA infection nor to guide or monitor treatment for MRSA infections. RESULT CALLED TO, READ BACK BY AND VERIFIED WITH: CUMMINGS,B RN 0410 07/29/17 MITCHELL,L       Radiology Studies: Dg Chest 2 View  Result Date: 07/28/2017 CLINICAL DATA:  One day history of chest tightness, shortness of breath, and cough. History of CHF, coronary artery disease, COPD. Former smoker. EXAM: CHEST  2 VIEW COMPARISON:  PA and lateral chest x-ray of November 04, 2016 FINDINGS: The lungs are well-expanded. The interstitial markings are coarse. The cardiac silhouette is enlarged. The central pulmonary vascularity is prominent. There is no pleural effusion. There is calcification in the wall of the aortic arch. The ICD is in stable position. The  observed bony thorax is unremarkable. IMPRESSION: COPD. Superimposed low-grade CHF. No alveolar pneumonia nor significant pleural effusion. Thoracic aortic atherosclerosis. Electronically Signed   By: David  Martinique M.D.   On: 07/28/2017 16:03     Scheduled Meds: . amiodarone  200 mg Oral Daily  .  apixaban  5 mg Oral BID  . brimonidine  1 drop Left Eye TID  . chlorhexidine  15 mL Mouth Rinse BID  . [START ON 07/30/2017] Chlorhexidine Gluconate Cloth  6 each Topical Q0600  . cholecalciferol  2,000 Units Oral Daily  . docusate sodium  100 mg Oral BID  . dorzolamide  1 drop Both Eyes BID  . fluticasone  1 spray Each Nare BID  . insulin aspart  150 Units Subcutaneous QHS  . insulin aspart  220 Units Subcutaneous Q supper  . insulin aspart  240 Units Subcutaneous Q1200  . [START ON 07/30/2017] insulin aspart  300 Units Subcutaneous Q breakfast  . ipratropium-albuterol  3 mL Nebulization Once  . levothyroxine  75 mcg Oral QAC breakfast  . mouth rinse  15 mL Mouth Rinse q12n4p  . methylPREDNISolone (SOLU-MEDROL) injection  60 mg Intravenous Q12H  . metoprolol tartrate  25 mg Oral TID  . multivitamin with minerals  1 tablet Oral Daily  . mupirocin ointment  1 application Nasal BID  . Netarsudil Dimesylate  1 drop Left Eye Daily  . omega-3 acid ethyl esters  1 g Oral Daily  . pantoprazole  40 mg Oral Daily  . rosuvastatin  20 mg Oral QPM  . saccharomyces boulardii  250 mg Oral Daily  . timolol  1 drop Both Eyes BID  . Travoprost (BAK Free)  1 drop Left Eye QHS   Continuous Infusions: . sodium chloride 75 mL/hr at 07/28/17 2159  . sodium chloride    . doxycycline (VIBRAMYCIN) IV Stopped (07/29/17 0836)  . insulin (NOVOLIN-R) infusion 16.8 Units/hr (07/29/17 1213)     LOS: 1 day   Time Spent in minutes   45 minutes  Brynlei Klausner D.O. on 07/29/2017 at 12:44 PM  Between 7am to 7pm - Pager - [phone]  After 7pm go to [email or web address] - password [email or web address]  And look for the night  coverage person covering for me after hours  [facility] Office  [phone]

## 2017-07-29 NOTE — Progress Notes (Signed)
Pt given 240 units of Novolog - checked order - diabetes coordinator and pharmacy and DR Ree Kida all have reviewed patients history and ordered insulin according to his regime. Dose verified by Harlan Stains RN

## 2017-07-29 NOTE — Progress Notes (Addendum)
CRITICAL VALUE ALERT  Critical Value:Lactic Acid 3.8  Date & Time Notied: 0751  Provider Notified: [id number] - DR Ree Kida  Orders Received/Actions taken: 0830

## 2017-07-29 NOTE — Evaluation (Signed)
Occupational Therapy Evaluation Patient Details Name: [name] MRN: [id number] DOB: 1941-12-07 Today's Date: 07/29/2017    History of Present Illness Pt admitted with SOB, chest pressure and congestion. PMH: CAD, pacemaker, CKD, afib, OSA, morbid obesity, HTN, glaucoma, CHF, DM, COPD, bladder cancer, peripheral neuropathy.   Clinical Impression   Pt was walking with cane or walker prior to admission and was assisted for LB bathing and dressing. Pt cooked breakfast daily for himself and his wife. He rarely leaves his home. Pt presents with decreased activity tolerance and impaired standing balance. 02 sats dropped to 83% on 2L 02 with ambulation, recovered within a minute to 91% with pursed lip breathing. Will follow acutely to address ADL and energy conservation education. Do not anticipate pt will need post acute OT.    Follow Up Recommendations  No OT follow up    Equipment Recommendations  None recommended by OT    Recommendations for Other Services       Precautions / Restrictions Precautions Precautions: Fall Precaution Comments: watch 02 sats Restrictions Weight Bearing Restrictions: No      Mobility Bed Mobility Overal bed mobility: Needs Assistance Bed Mobility: Rolling;Sidelying to Sit Rolling: Supervision Sidelying to sit: Supervision       General bed mobility comments: increased time, heavy reliance on rail, HOB up  Transfers Overall transfer level: Needs assistance Equipment used: Rolling walker (2 wheeled) Transfers: Sit to/from Stand Sit to Stand: Min guard         General transfer comment: pulls up on walker, increased time    Balance Overall balance assessment: Needs assistance   Sitting balance-Leahy Scale: Good       Standing balance-Leahy Scale: Poor Standing balance comment: reliant on walker to ambulate and at least one hand support when pulling up pants                           ADL either performed or assessed with  clinical judgement   ADL Overall ADL's : Needs assistance/impaired Eating/Feeding: Independent;Bed level   Grooming: Set up;Sitting   Upper Body Bathing: Minimal assistance;Sitting   Lower Body Bathing: Moderate assistance;Sit to/from stand   Upper Body Dressing : Set up;Sitting   Lower Body Dressing: Moderate assistance;Sit to/from stand   Toilet Transfer: Min guard;Ambulation;RW;BSC   Toileting- Water quality scientist and Hygiene: Min guard;Sit to/from stand Toileting - Clothing Manipulation Details (indicate cue type and reason): for shorts     Functional mobility during ADLs: Min guard;Rolling walker General ADL Comments: Pt has a sedentary lifestyle, walks only short, household distances.     Vision Baseline Vision/History: Glaucoma;Wears glasses Wears Glasses: Reading only Patient Visual Report: No change from baseline       Perception     Praxis      Pertinent Vitals/Pain Pain Assessment: No/denies pain     Hand Dominance Left   Extremity/Trunk Assessment Upper Extremity Assessment Upper Extremity Assessment: Overall WFL for tasks assessed   Lower Extremity Assessment Lower Extremity Assessment: Defer to PT evaluation       Communication Communication Communication: HOH   Cognition Arousal/Alertness: Awake/alert Behavior During Therapy: WFL for tasks assessed/performed Overall Cognitive Status: Within Functional Limits for tasks assessed                                     General Comments       Exercises  Shoulder Instructions      Home Living Family/patient expects to be discharged to:: Private residence Living Arrangements: Spouse/significant other Available Help at Discharge: Family;Available 24 hours/day Type of Home: House Home Access: Stairs to enter [employer] of Steps: 4 Entrance Stairs-Rails: Right Home Layout: One level     Bathroom Shower/Tub: Occupational psychologist: Standard      Home Equipment: Environmental consultant - 2 wheels;Walker - 4 wheels;Cane - single point;Shower seat;Hand held shower head   Additional Comments: sleeps in a recliner      Prior Functioning/Environment Level of Independence: Needs assistance  Gait / Transfers Assistance Needed: uses a cane and furniture inside and RW out ADL's / Homemaking Assistance Needed: wife helps with LB bathing and dressing, sits to shower   Comments: wife and pt share cooking activities        OT Problem List: Decreased activity tolerance;Impaired balance (sitting and/or standing);Decreased knowledge of use of DME or AE;Cardiopulmonary status limiting activity;Obesity      OT Treatment/Interventions: Self-care/ADL training;Energy conservation;DME and/or AE instruction;Patient/family education;Therapeutic activities    OT Goals(Current goals can be found in the care plan section) Acute Rehab OT Goals Patient Stated Goal: to return home with wife OT Goal Formulation: With patient Time For Goal Achievement: 08/12/17 Potential to Achieve Goals: Good ADL Goals Pt Will Perform Grooming: standing;with supervision (2 activities) Pt Will Transfer to Toilet: with supervision;ambulating;regular height toilet Pt Will Perform Toileting - Clothing Manipulation and hygiene: with supervision;sit to/from stand Pt Will Perform Tub/Shower Transfer: Shower transfer;with supervision;ambulating;shower seat;rolling walker Additional ADL Goal #1: Pt will be aware of AE for LB bathing and dressing. Additional ADL Goal #2: Pt will implement breathing strategies and energy conservation during ADL and mobility.  OT Frequency: Min 2X/week   Barriers to D/C:            Co-evaluation PT/OT/SLP Co-Evaluation/Treatment: Yes Reason for Co-Treatment: For patient/therapist safety   OT goals addressed during session: ADL's and self-care      AM-PAC PT "6 Clicks" Daily Activity     Outcome Measure Help from another person eating meals?:  None Help from another person taking care of personal grooming?: A Little Help from another person toileting, which includes using toliet, bedpan, or urinal?: A Little Help from another person bathing (including washing, rinsing, drying)?: A Lot Help from another person to put on and taking off regular upper body clothing?: None Help from another person to put on and taking off regular lower body clothing?: A Lot 6 Click Score: 18   End of Session Equipment Utilized During Treatment: Rolling walker;Oxygen (2L) Nurse Communication: Mobility status  Activity Tolerance: Patient limited by fatigue Patient left: in chair;with call bell/phone within reach;with family/visitor present  OT Visit Diagnosis: Unsteadiness on feet (R26.81)                Time: 8676-1950 OT Time Calculation (min): 26 min Charges:  OT General Charges $OT Visit: 1 Procedure OT Evaluation $OT Eval Moderate Complexity: 1 Procedure G-Codes:     Malka So 07/29/2017, 12:03 PM  07/29/2017 Nestor Lewandowsky, OTR/L Pager: [phone]

## 2017-07-29 NOTE — Progress Notes (Signed)
CRITICAL VALUE ALERT  Critical Value:  Glucose 572  Date & Time Notied:  07/29/17 5:28 AM   Provider Notified: Schorr, NP  Orders Received/Actions taken: Pt already on insulin gtt/glucostabilizer and CBGs trending down. Will continue to monitor.

## 2017-07-29 NOTE — Progress Notes (Signed)
Nutrition Consult/Brief Note  RD consulted per COPD Gold Protocol.  Wt Readings from Last 15 Encounters:  07/28/17 (!) 313 lb (142 kg)  05/06/17 (!) 313 lb (142 kg)  04/27/17 (!) 313 lb (142 kg)  02/04/17 (!) 314 lb (142.4 kg)  11/04/16 (!) 311 lb (141.1 kg)  11/03/16 (!) 311 lb 12.8 oz (141.4 kg)  07/30/16 (!) 312 lb (141.5 kg)  05/07/16 (!) 314 lb 12.8 oz (142.8 kg)  04/30/16 (!) 313 lb (142 kg)  02/20/16 (!) 318 lb (144.2 kg)  11/21/15 (!) 314 lb (142.4 kg)  11/16/15 (!) 318 lb (144.2 kg)  11/16/15 (!) 319 lb (144.7 kg)  10/23/15 (!) 312 lb (141.5 kg)  08/17/15 (!) 312 lb (141.5 kg)   Body mass index is 44.91 kg/m. Patient meets criteria for Obesity Class III based on current BMI.   Pt currently on a Heart Healthy/Carbohydrate Modified diet. Reports a good appetite. Denies unintentional weight loss.  Labs and medications reviewed. CBG's [phone].  No nutrition interventions warranted at this time. If nutrition issues arise, please consult RD.   Arthur Holms, RD, LDN Pager #: [phone] After-Hours Pager #: [phone]

## 2017-07-29 NOTE — Progress Notes (Signed)
DR Ree Kida notified of critical lab - lactic acid level of 4.4 - orders received

## 2017-07-30 ENCOUNTER — Encounter: Payer: Self-pay | Admitting: Cardiology

## 2017-07-30 ENCOUNTER — Inpatient Hospital Stay (HOSPITAL_COMMUNITY): Payer: Medicare HMO

## 2017-07-30 DIAGNOSIS — E162 Hypoglycemia, unspecified: Secondary | ICD-10-CM

## 2017-07-30 DIAGNOSIS — A419 Sepsis, unspecified organism: Secondary | ICD-10-CM

## 2017-07-30 DIAGNOSIS — I10 Essential (primary) hypertension: Secondary | ICD-10-CM

## 2017-07-30 DIAGNOSIS — G4733 Obstructive sleep apnea (adult) (pediatric): Secondary | ICD-10-CM

## 2017-07-30 DIAGNOSIS — I350 Nonrheumatic aortic (valve) stenosis: Secondary | ICD-10-CM

## 2017-07-30 LAB — BASIC METABOLIC PANEL
ANION GAP: 8 (ref 5–15)
BUN: 40 mg/dL — ABNORMAL HIGH (ref 6–20)
CALCIUM: 8.5 mg/dL — AB (ref 8.9–10.3)
CO2: 21 mmol/L — ABNORMAL LOW (ref 22–32)
Chloride: 107 mmol/L (ref 101–111)
Creatinine, Ser: 1.51 mg/dL — ABNORMAL HIGH (ref 0.61–1.24)
GFR, EST AFRICAN AMERICAN: 51 mL/min — AB (ref >60)
GFR, EST NON AFRICAN AMERICAN: 44 mL/min — AB (ref >60)
Glucose, Bld: 71 mg/dL (ref 65–99)
POTASSIUM: 4 mmol/L (ref 3.5–5.1)
Sodium: 136 mmol/L (ref 135–145)

## 2017-07-30 LAB — CBC
HEMATOCRIT: 36.4 % — AB (ref 39.0–52.0)
Hemoglobin: 11.7 g/dL — ABNORMAL LOW (ref 13.0–17.0)
MCH: 30.5 pg (ref 26.0–34.0)
MCHC: 32.1 g/dL (ref 30.0–36.0)
MCV: 95 fL (ref 78.0–100.0)
PLATELETS: 208 10*3/uL (ref 150–400)
RBC: 3.83 MIL/uL — AB (ref 4.22–5.81)
RDW: 16.2 % — AB (ref 11.5–15.5)
WBC: 18.5 10*3/uL — AB (ref 4.0–10.5)

## 2017-07-30 LAB — GLUCOSE, CAPILLARY
GLUCOSE-CAPILLARY: 103 mg/dL — AB (ref 65–99)
GLUCOSE-CAPILLARY: 281 mg/dL — AB (ref 65–99)
GLUCOSE-CAPILLARY: 152 mg/dL — AB (ref 65–99)
GLUCOSE-CAPILLARY: 117 mg/dL — AB (ref 65–99)
GLUCOSE-CAPILLARY: 69 mg/dL (ref 65–99)
Glucose-Capillary: 54 mg/dL — ABNORMAL LOW (ref 65–99)
Glucose-Capillary: 82 mg/dL (ref 65–99)
Glucose-Capillary: 134 mg/dL — ABNORMAL HIGH (ref 65–99)
Glucose-Capillary: 70 mg/dL (ref 65–99)

## 2017-07-30 LAB — TROPONIN I
TROPONIN I: 0.05 ng/mL — AB (ref <0.03)
TROPONIN I: 0.05 ng/mL — AB (ref <0.03)

## 2017-07-30 MED ORDER — PERFLUTREN LIPID MICROSPHERE
1.0000 mL | INTRAVENOUS | Status: AC | PRN
Start: 2017-07-30 — End: 2017-07-30
  Administered 2017-07-31: 5 mL via INTRAVENOUS
  Filled 2017-07-30: qty 10

## 2017-07-30 NOTE — Progress Notes (Signed)
  Echocardiogram 2D Echocardiogram has been performed.  [name] 07/30/2017, 5:02 PM

## 2017-07-30 NOTE — Progress Notes (Addendum)
PROGRESS NOTE    [name]  PZW:[id number] DOB: 1941/09/18 DOA: 07/28/2017 PCP: Renato Shin, MD   Chief Complaint  Patient presents with  . Shortness of Breath  . Chest Pain    Brief Narrative:  HPI On 07/28/2017 by Dr. Karmen Bongo [name] is a 76 y.o. male with medical history significant of pacemaker placement; CKD; afib on anticoagulation; OSA on CPAP; morbid obesity; hypothyroidism; HTN; glaucoma; chronic diastolic heart failure; DM; COPD; bladder cancer; and CAD presenting with SOB.  This morning when he woke up he was SOB, lots of congestion that he couldn't cough up, was panting for breath.  His left arm was hurting a little bit.  Felt like a lot of pressure in his chest.  +chills, shaking like someone having a seizure, no fever.  He felt normal when he went to bed last night.  Slightly productive cough with clear sputum.  No recent sick contacts - he had the flu right after Christmas.  His face has felt very hot, like it's flushed.  He feels very thirsty, with a dry mouth.  He has felt like a lot of tightness and congestion and pressure that is hard to cough up, hard to get a deep breath, brething is very shallow.  He did get NTG en route without improvement but it caused nausea.  Breathing treatment did help some.  He does not wear O2 at home, just CPAP qhs.  He did use Albuterol at home with some improvement.  He takes Mucinex for congestion.  He was previously on a different kind of inhaler for a while but he stopped using it because it wasn't working.  Assessment & Plan   Acute on chronic respiratory failure/COPD exacerbation -CXR showed COPD, superimposed low grade CHF. No pneumonia  -Continue nebulizer treatments, Solu-Medrol, doxycycline, antitussives, flonase -Continues to need O2 -Monitored O2 sats: at rest (room air) 94%,  with ambulation (room air) patient dropped to 70%. Was placed back on 3L with ambulation, O2 sats improved to 90-92%. -Suspect patient will  need O2 at discharge  Uncontrolled Diabetes Mellitus, type II complicated by hyperglycemia and hypoglycemia -Per patient he takes about 900 unites of insulin per day and feels he and his outpatient physician have it controlled -Blood glucose/CBGs have been consistently >500 -Patient was placed on glucostabilizer overnight with IVF -Currently does not have elevated anion gap, however, metabolic acidosis improved (CO2 21) -have restarted patient's home regimen of insulin -He should be on a long acting/basal insulin. Patient would like to discuss this with his PCP, Dr. [name].  -Diabetes coordinator consulted and appreciated -CBG 54 this morning, was given a snack, improved to 84. Morning dose of insulin was held.  -will continue to monitor CBG and treat accordingly  Leukocytosis -Chest x-ray and UA unremarkable for infection -blood cultures show no growth to date -Likely reactive, suspect secondary to respiratory failure with hyperglycemia and steriods -Continue to monitor CBC  Lactic acidosis -Likely secondary to the above -Continue IV fluid hydration and monitor -Will continue to monitor   Chronic kidney disease, stage III -Baseline creatinine 1.7-1.8 -Currently creatinine 1.51 -Continue to monitor BMP  ?Chronic diastolic heart failure -Last echocardiogram in 2015 note an EF of 55% -BNP 173.7 -Lasix was held as patient was receiving IVF for hyperglycemia  -Currently patient does appear to be euvolemic -Monitor intake and output, daily weights -Will obtain echocardiogram as patient could have CHF which may also lead to dyspnea  Mildly elevated troponin -Troponin 0.05, no  reports of chest pain -Likely secondary to respiratory failure in the setting of chronic kidney disease, stage III -Will trend and obtain echocardiogram  Hypotension -will place holding parameters on metoprolol -Continue to hold lasix   Paroxysmal atrial fibrillation -Continue Eliquis, amiodarone,  metoprolol  Hypothyroidism -continue synthroid  Hyperlipidemia -Continue statin, Lovaza  DVT Prophylaxis  Eliquis   Code Status: DNR  Family Communication: None at bedside   Disposition Plan: Admitted, continue to monitor in stepdown.   Consultants None  Procedures  None  Antibiotics   Anti-infectives    Start     Dose/Rate Route Frequency Ordered Stop   07/29/17 0600  doxycycline (VIBRAMYCIN) 100 mg in dextrose 5 % 250 mL IVPB     100 mg 125 mL/hr over 120 Minutes Intravenous Every 12 hours 07/28/17 2021     07/28/17 1745  cefTRIAXone (ROCEPHIN) 1 g in dextrose 5 % 50 mL IVPB     1 g 100 mL/hr over 30 Minutes Intravenous  Once 07/28/17 1730 07/28/17 2004   07/28/17 1745  azithromycin (ZITHROMAX) 500 mg in dextrose 5 % 250 mL IVPB     500 mg 250 mL/hr over 60 Minutes Intravenous  Once 07/28/17 1730 07/28/17 2054      Subjective:   [name] seen and examined today.  Patient continues to have shortness of breath, which worsens with exertion. Feels his cough is mildly improved. Denies chest pain, abdominal pain, nausea or vomiting, diarrhea or constipation. Patient upset that he did not receive his morning insulin dose.  Objective:   Vitals:   07/30/17 0400 07/30/17 0500 07/30/17 0815 07/30/17 1219  BP: (!) 102/56 105/69 125/61 125/73  Pulse: 73 74 71 87  Resp: 17 17 (!) 21 (!) 23  Temp:   97.6 F (36.4 C) (!) 97.5 F (36.4 C)  TempSrc: Oral  Oral Oral  SpO2: 91% 93% 95% 95%  Weight:      Height:        Intake/Output Summary (Last 24 hours) at 07/30/17 1237 Last data filed at 07/30/17 1220  Gross per 24 hour  Intake          3102.78 ml  Output             1600 ml  Net          1502.78 ml   Filed Weights   07/28/17 1529  Weight: (!) 142 kg (313 lb)   Exam  General: Well developed, well nourished, no apparent distress  HEENT: NCAT, mucous membranes moist.   Cardiovascular: S1 S2 auscultated, RRR, soft murmur  Respiratory: Diminished breath  sounds with mild exp wheezing, cough  Abdomen: Soft, obese, nontender, nondistended, + bowel sounds  Extremities: warm dry without cyanosis clubbing. Bilateral lower extremity edema with venous stasis skin changes  Neuro: AAOx3, nonfocal  Psych: appropriate  Data Reviewed: I have personally reviewed following labs and imaging studies  CBC:  Recent Labs Lab 07/28/17 1530 07/29/17 0430 07/30/17 0409  WBC 19.1* 18.5* 18.5*  HGB 11.9* 11.6* 11.7*  HCT 37.6* 36.0* 36.4*  MCV 95.2 95.0 95.0  PLT 208 192 638   Basic Metabolic Panel:  Recent Labs Lab 07/29/17 0430 07/29/17 0618 07/29/17 1027 07/29/17 1455 07/30/17 0409  NA 133* 134* 134* 135 136  K 4.0 3.9 4.0 4.1 4.0  CL 103 102 104 106 107  CO2 17* 15* 18* 19* 21*  GLUCOSE 572* 470* 338* 250* 71  BUN 39* 39* 39* 40* 40*  CREATININE 2.06* 2.03* 1.83*  1.81* 1.51*  CALCIUM 8.4* 8.4* 8.4* 8.4* 8.5*   GFR: Estimated Creatinine Clearance: 61.1 mL/min (A) (by C-G formula based on SCr of 1.51 mg/dL (H)). Liver Function Tests: No results for input(s): AST, ALT, ALKPHOS, BILITOT, PROT, ALBUMIN in the last 168 hours. No results for input(s): LIPASE, AMYLASE in the last 168 hours. No results for input(s): AMMONIA in the last 168 hours. Coagulation Profile:  Recent Labs Lab 07/28/17 2030  INR 1.33   Cardiac Enzymes:  Recent Labs Lab 07/28/17 2030  TROPONINI 0.05*   BNP (last 3 results) No results for input(s): PROBNP in the last 8760 hours. HbA1C: No results for input(s): HGBA1C in the last 72 hours. CBG:  Recent Labs Lab 07/30/17 0205 07/30/17 0544 07/30/17 0601 07/30/17 0813 07/30/17 1145  GLUCAP 103* 54* 82 134* 281*   Lipid Profile: No results for input(s): CHOL, HDL, LDLCALC, TRIG, CHOLHDL, LDLDIRECT in the last 72 hours. Thyroid Function Tests: No results for input(s): TSH, T4TOTAL, FREET4, T3FREE, THYROIDAB in the last 72 hours. Anemia Panel: No results for input(s): VITAMINB12, FOLATE, FERRITIN,  TIBC, IRON, RETICCTPCT in the last 72 hours. Urine analysis:    Component Value Date/Time   COLORURINE YELLOW 07/28/2017 1833   APPEARANCEUR CLEAR 07/28/2017 1833   LABSPEC 1.017 07/28/2017 1833   PHURINE 5.0 07/28/2017 1833   GLUCOSEU >=500 (A) 07/28/2017 1833   GLUCOSEU 100 09/30/2013 1535   HGBUR NEGATIVE 07/28/2017 1833   BILIRUBINUR NEGATIVE 07/28/2017 1833   KETONESUR NEGATIVE 07/28/2017 1833   PROTEINUR NEGATIVE 07/28/2017 1833   UROBILINOGEN 1.0 10/01/2013 1003   NITRITE NEGATIVE 07/28/2017 1833   LEUKOCYTESUR NEGATIVE 07/28/2017 1833   Sepsis Labs: @LABRCNTIP (procalcitonin:4,lacticidven:4)  ) Recent Results (from the past 240 hour(s))  Culture, blood (routine x 2)     Status: None (Preliminary result)   Collection Time: 07/28/17  5:50 PM  Result Value Ref Range Status   Specimen Description BLOOD RIGHT HAND  Final   Special Requests   Final    BOTTLES DRAWN AEROBIC AND ANAEROBIC Blood Culture adequate volume   Culture NO GROWTH 2 DAYS  Final   Report Status PENDING  Incomplete  Culture, blood (routine x 2)     Status: None (Preliminary result)   Collection Time: 07/28/17  6:02 PM  Result Value Ref Range Status   Specimen Description BLOOD RIGHT FOREARM  Final   Special Requests   Final    BOTTLES DRAWN AEROBIC AND ANAEROBIC Blood Culture adequate volume   Culture NO GROWTH 2 DAYS  Final   Report Status PENDING  Incomplete  MRSA PCR Screening     Status: Abnormal   Collection Time: 07/29/17  1:07 AM  Result Value Ref Range Status   MRSA by PCR POSITIVE (A) NEGATIVE Final    Comment:        The GeneXpert MRSA Assay (FDA approved for NASAL specimens only), is one component of a comprehensive MRSA colonization surveillance program. It is not intended to diagnose MRSA infection nor to guide or monitor treatment for MRSA infections. RESULT CALLED TO, READ BACK BY AND VERIFIED WITH: CUMMINGS,B RN 0410 07/29/17 MITCHELL,L       Radiology Studies: Dg Chest 2  View  Result Date: 07/28/2017 CLINICAL DATA:  One day history of chest tightness, shortness of breath, and cough. History of CHF, coronary artery disease, COPD. Former smoker. EXAM: CHEST  2 VIEW COMPARISON:  PA and lateral chest x-ray of November 04, 2016 FINDINGS: The lungs are well-expanded. The interstitial markings are coarse.  The cardiac silhouette is enlarged. The central pulmonary vascularity is prominent. There is no pleural effusion. There is calcification in the wall of the aortic arch. The ICD is in stable position. The observed bony thorax is unremarkable. IMPRESSION: COPD. Superimposed low-grade CHF. No alveolar pneumonia nor significant pleural effusion. Thoracic aortic atherosclerosis. Electronically Signed   By: David  Martinique M.D.   On: 07/28/2017 16:03     Scheduled Meds: . amiodarone  200 mg Oral Daily  . apixaban  5 mg Oral BID  . brimonidine  1 drop Left Eye TID  . chlorhexidine  15 mL Mouth Rinse BID  . Chlorhexidine Gluconate Cloth  6 each Topical Q0600  . cholecalciferol  2,000 Units Oral Daily  . docusate sodium  100 mg Oral BID  . dorzolamide  1 drop Both Eyes BID  . fluticasone  1 spray Each Nare BID  . insulin aspart  150 Units Subcutaneous QHS  . insulin aspart  220 Units Subcutaneous Q supper  . insulin aspart  240 Units Subcutaneous Q1200  . insulin aspart  300 Units Subcutaneous Q breakfast  . ipratropium-albuterol  3 mL Nebulization Once  . levothyroxine  75 mcg Oral QAC breakfast  . mouth rinse  15 mL Mouth Rinse q12n4p  . methylPREDNISolone (SOLU-MEDROL) injection  60 mg Intravenous Q12H  . metoprolol tartrate  25 mg Oral TID  . multivitamin with minerals  1 tablet Oral Daily  . mupirocin ointment  1 application Nasal BID  . Netarsudil Dimesylate  1 drop Left Eye Daily  . omega-3 acid ethyl esters  1 g Oral Daily  . pantoprazole  40 mg Oral Daily  . rosuvastatin  20 mg Oral QPM  . saccharomyces boulardii  250 mg Oral Daily  . timolol  1 drop Both  Eyes BID  . Travoprost (BAK Free)  1 drop Left Eye QHS   Continuous Infusions: . sodium chloride 75 mL/hr at 07/30/17 0537  . sodium chloride    . doxycycline (VIBRAMYCIN) IV Stopped (07/30/17 0735)     LOS: 2 days   Time Spent in minutes   30 minutes  Jarman Litton D.O. on 07/30/2017 at 12:37 PM  Between 7am to 7pm - Pager - [phone]  After 7pm go to [email or web address] - password [email or web address]  And look for the night coverage person covering for me after hours  [facility] Office  [phone]

## 2017-07-30 NOTE — Progress Notes (Signed)
SATURATION QUALIFICATIONS: (This note is used to comply with regulatory documentation for home oxygen)  Patient Saturations on Room Air at Rest = 94%  Patient Saturations on Room Air while Ambulating = when started walking  From the room to the desk saturation decrease 84% to 70%  Patient Saturations on 3  Liters of oxygen while Ambulating = 90% to 92%  Please briefly explain why patient needs home oxygen3L. When patient saturation drop from 804% to 70% he was place on 2Liters. Oxygen saturation started going up to 85% to 86%. Oxygen continue to be in the mid 80% while ambulating back to room oxygen was increase to 3Liters and saturation eventually went from 90 to 92%.

## 2017-07-30 NOTE — Care Management Note (Addendum)
Case Management Note  Patient Details  Name: [name] MRN: [id number] Date of Birth: 16-Oct-1941  Subjective/Objective:   From home with wife, presents with acute on chronic resp failure/ copd ex, hyperglycemia, uncontrolled DM, leukocytosis, lactic acidosis, ckd stage 3 , chronic diastolic heart failure, elevated trops , hypotension, afib, hld.  He wears a cpap at night at home. He was transferred to SDU due to [facility] greater than 500, was put on insulin drip.  He conts to have productive cough  And sob with exertion, may need home oxygen.  NCM went in to talk to patient about home oxygen but he was having a test done, will check back .   8/24 [place], BSN - patient is for dc today, will need home oxygen and neb machine, NCM spoke with wife, she chose [facility] for home oxygen and neb machine.  Referral made to [facility].   Butch Penny states they do not do meds for neb machine , he will need scripts.  NCM informed MD.                  Action/Plan: NCM will follow for dc needs.  Expected Discharge Date:                  Expected Discharge Plan:     In-House Referral:     Discharge planning Services  CM Consult  Post Acute Care Choice:    Choice offered to:     DME Arranged:    DME Agency:     HH Arranged:    HH Agency:     Status of Service:  In process, will continue to follow  If discussed at Long Length of Stay Meetings, dates discussed:    Additional Comments:  Zenon Mayo, RN 07/30/2017, 4:42 PM

## 2017-07-30 NOTE — Progress Notes (Signed)
Placed patient on cpap @ 23:40. Patient resting comfortably. Will continue to monitor.

## 2017-07-30 NOTE — Progress Notes (Signed)
Hypoglycemic Event  CBG: 54  Treatment: 15 GM carbohydrate snack  Symptoms: None  Follow-up CBG: Time:0600 CBG Result:82  Possible Reasons for Event: Inadequate meal intake and Medication regimen: insulin  Comments/MD notified:    Levonne Hubert

## 2017-07-30 NOTE — Progress Notes (Signed)
Patient placed on cpap by RN.

## 2017-07-31 ENCOUNTER — Other Ambulatory Visit (HOSPITAL_COMMUNITY): Payer: Medicare HMO

## 2017-07-31 LAB — ECHOCARDIOGRAM COMPLETE
AO mean calculated velocity dopler: 238 cm/s
AOPV: 0.24 m/s
AV Peak grad: 39 mmHg
AV peak Index: 0.34
AV pk vel: 314 cm/s
AVAREAMEANV: 0.83 cm2
AVAREAMEANVIN: 0.3 cm2/m2
AVAREAVTI: 0.93 cm2
AVAREAVTIIND: 0.34 cm2/m2
AVCELMEANRAT: 0.22
AVG: 25 mmHg
Ao-asc: 33 cm
Area-P 1/2: 2.86 cm2
CHL CUP AV VALUE AREA INDEX: 0.34
CHL CUP AV VEL: 0.92
CHL CUP DOP CALC LVOT VTI: 17.1 cm
FS: 16 % — AB (ref 28–44)
Height: 70 in
IVS/LV PW RATIO, ED: 0.79
LA ID, A-P, ES: 47 mm
LA diam end sys: 47 mm
LA diam index: 1.73 cm/m2
LA vol A4C: 88.5 ml
LA vol: 88.8 mL
LAVOLIN: 32.7 mL/m2
LDCA: 3.8 cm2
LV PW d: 15.5 mm — AB (ref 0.6–1.1)
LV TDI E'LATERAL: 9.03
LV TDI E'MEDIAL: 4.68
LV e' LATERAL: 9.03 cm/s
LVOT SV: 65 mL
LVOT peak VTI: 0.24 cm
LVOT peak vel: 76.5 cm/s
LVOTD: 22 mm
P 1/2 time: 77 ms
RV LATERAL S' VELOCITY: 10.9 cm/s
RV TAPSE: 25.3 mm
VTI: 70.8 cm
Valve area: 0.92 cm2
WEIGHTICAEL: 5008 [oz_av]

## 2017-07-31 LAB — LACTIC ACID, PLASMA: LACTIC ACID, VENOUS: 2.1 mmol/L — AB (ref 0.5–1.9)

## 2017-07-31 LAB — GLUCOSE, CAPILLARY
GLUCOSE-CAPILLARY: 106 mg/dL — AB (ref 65–99)
GLUCOSE-CAPILLARY: 170 mg/dL — AB (ref 65–99)
GLUCOSE-CAPILLARY: 96 mg/dL (ref 65–99)
Glucose-Capillary: 190 mg/dL — ABNORMAL HIGH (ref 65–99)
Glucose-Capillary: 250 mg/dL — ABNORMAL HIGH (ref 65–99)
Glucose-Capillary: 111 mg/dL — ABNORMAL HIGH (ref 65–99)

## 2017-07-31 LAB — CBC
HCT: 36.8 % — ABNORMAL LOW (ref 39.0–52.0)
Hemoglobin: 12 g/dL — ABNORMAL LOW (ref 13.0–17.0)
MCH: 30.8 pg (ref 26.0–34.0)
MCHC: 32.6 g/dL (ref 30.0–36.0)
MCV: 94.4 fL (ref 78.0–100.0)
Platelets: 219 10*3/uL (ref 150–400)
RBC: 3.9 MIL/uL — AB (ref 4.22–5.81)
RDW: 16.2 % — ABNORMAL HIGH (ref 11.5–15.5)
WBC: 16.4 10*3/uL — AB (ref 4.0–10.5)

## 2017-07-31 LAB — TROPONIN I: TROPONIN I: 0.04 ng/mL — AB (ref <0.03)

## 2017-07-31 LAB — BASIC METABOLIC PANEL
ANION GAP: 10 (ref 5–15)
BUN: 42 mg/dL — ABNORMAL HIGH (ref 6–20)
CALCIUM: 8.3 mg/dL — AB (ref 8.9–10.3)
CO2: 19 mmol/L — ABNORMAL LOW (ref 22–32)
Chloride: 104 mmol/L (ref 101–111)
Creatinine, Ser: 1.49 mg/dL — ABNORMAL HIGH (ref 0.61–1.24)
GFR, EST AFRICAN AMERICAN: 52 mL/min — AB (ref >60)
GFR, EST NON AFRICAN AMERICAN: 44 mL/min — AB (ref >60)
GLUCOSE: 99 mg/dL (ref 65–99)
POTASSIUM: 3.9 mmol/L (ref 3.5–5.1)
SODIUM: 133 mmol/L — AB (ref 135–145)

## 2017-07-31 MED ORDER — FUROSEMIDE 40 MG PO TABS
40.0000 mg | ORAL_TABLET | Freq: Every day | ORAL | Status: DC
  Administered 2017-07-31: 40 mg via ORAL
  Filled 2017-07-31: qty 1

## 2017-07-31 MED ORDER — IPRATROPIUM-ALBUTEROL 0.5-2.5 (3) MG/3ML IN SOLN: 3.0000 mL | RESPIRATORY_TRACT | 1 refills | Status: DC | PRN

## 2017-07-31 MED ORDER — ALBUTEROL SULFATE (2.5 MG/3ML) 0.083% IN NEBU: 2.5000 mg | INHALATION_SOLUTION | RESPIRATORY_TRACT | 1 refills | Status: DC | PRN

## 2017-07-31 MED ORDER — DOXYCYCLINE HYCLATE 100 MG PO TABS
100.0000 mg | ORAL_TABLET | Freq: Two times a day (BID) | ORAL | Status: DC
  Administered 2017-07-31: 100 mg via ORAL
  Filled 2017-07-31: qty 1

## 2017-07-31 MED ORDER — PREDNISONE 20 MG PO TABS: ORAL_TABLET | ORAL | 0 refills | Status: DC

## 2017-07-31 MED ORDER — DOXYCYCLINE HYCLATE 100 MG PO CAPS: 100.0000 mg | ORAL_CAPSULE | Freq: Two times a day (BID) | ORAL | 0 refills | Status: DC

## 2017-07-31 NOTE — Progress Notes (Signed)
CRITICAL VALUE ALERT  Critical Value:  Lactic acid 2.1  Date & Time Notied:  07/31/17 0155  Provider Notified: Hal Hope, MD  Orders Received/Actions taken: Continue to monitor

## 2017-07-31 NOTE — Discharge Summary (Signed)
Physician Discharge Summary  [name]:[id number] DOB: [birth year] DOA: 07/28/2017  PCP: Renato Shin, MD  Admit date: 07/28/2017 Discharge date: 07/31/2017  Time spent: 45 minutes  Recommendations for Outpatient Follow-up:  Patient will be discharged to home with home oxygen.  Patient will need to follow up with primary care provider within one week of discharge, repeat CBC and BMP.  Patient should continue medications as prescribed.  Patient should follow a heart healthy/carb modified diet.   Discharge Diagnoses:  Acute on chronic respiratory failure/COPD exacerbation Uncontrolled Diabetes Mellitus, type II complicated by hyperglycemia and hypoglycemia Leukocytosis Lactic acidosis Chronic kidney disease, stage III New systolic (mild) dysfunction/Chronic diastolic heart failure Mildly elevated troponin Hypotension Paroxysmal atrial fibrillation Hypothyroidism Hyperlipidemia  Discharge Condition: Stable  Diet recommendation: heart healthy/carb modified  Filed Weights   07/28/17 1529  Weight: (!) 142 kg (313 lb)    History of present illness:  On 07/28/2017 by Dr. Karmen Bongo [name] a 76 y.o.malewith medical history significant of pacemaker placement; CKD; afib on anticoagulation; OSA on CPAP; morbid obesity; hypothyroidism; HTN; glaucoma; chronic diastolic heart failure; DM; COPD; bladder cancer; and CAD presenting with SOB. This morning when he woke up he was SOB, lots of congestion that he couldn't cough up, was panting for breath. His left arm was hurting a little bit. Felt like a lot of pressure in his chest. +chills, shaking like someone having a seizure, no fever. He felt normal when he went to bed last night. Slightly productive cough with clear sputum. No recent sick contacts - he had the flu right after Christmas. His face has felt very hot, like it's flushed. He feels very thirsty, with a dry mouth. Hehas felt like a lot of tightness and  congestion and pressure that is hard to cough up, hard to get a deep breath, brething is very shallow. He did get NTG en route without improvement but it caused nausea. Breathing treatment did help some. He does not wear O2 at home, just CPAP qhs. He did use Albuterol at home with some improvement. He takesMucinex for congestion. He was previously on a different kind of inhaler for a while but he stopped using it because it wasn't working.  Hospital Course:  Acute on chronic respiratory failure/COPD exacerbation -CXR showed COPD, superimposed low grade CHF. No pneumonia  -Was placed on nebulizer treatments, Solu-Medrol, doxycycline, antitussives, flonase -Monitored O2 sats: at rest (room air) 94%,  with ambulation (room air) patient dropped to 70%. Was placed back on 3L with ambulation, O2 sats improved to 90-92%. -Will discharge with doxycycline, prednisone taper, home oxygen, nebulizer machine  Uncontrolled Diabetes Mellitus, type II complicated by hyperglycemia and hypoglycemia -Per patient he takes about 900 unites of insulin per day and feels he and his outpatient physician have it controlled -Blood glucose/CBGs have been consistently >500 -Patient was placed on glucostabilizer overnight with IVF -Currently does not have elevated anion gap, however, metabolic acidosis improved (CO2 21) -have restarted patient's home regimen of insulin -He should be on a long acting/basal insulin. Patient would like to discuss this with his PCP, Dr. [name].  -Diabetes coordinator consulted and appreciated  Leukocytosis -Chest x-ray and UA unremarkable for infection -blood cultures show no growth to date -Likely reactive, suspect secondary to respiratory failure with hyperglycemia and steriods -WBC mildly improved to 16.4 -Repeat CBC in 1 week  Lactic acidosis -Likely secondary to the above -Continue IV fluid hydration and monitor -Will continue to monitor   Chronic kidney disease,  stage  III -Baseline creatinine 1.7-1.8 -Currently creatinine 1.51 -Continue to monitor BMP  New systolic (mild) dysfunction/Chronic diastolic heart failure -Last echocardiogram in 2015 note an EF of 55% -BNP 173.7 -obtained repeat echocardiogram: EF 4550%, left ventricular diastolic function parameters were normal -Currently patient does appear to be euvolemic -Monitor intake and output, daily weights -Discussed via phone with Dr. Rayann Heman, patient's cardiologist, will follow up with patient on 08/12/2017 at 12:15 PM.  Mildly elevated troponin -Troponins cycled and remained flat, 0.04-0.05 -Likely secondary to respiratory failure in the setting of chronic kidney disease, stage III -Echocardiogram as above  Hypotension -Resolved  Paroxysmal atrial fibrillation -Continue Eliquis, amiodarone, metoprolol  Hypothyroidism -continue synthroid  Hyperlipidemia -Continue statin, [place]aza  Procedures: Echocardiogram  Consultations: None  Discharge Exam: Vitals:   07/31/17 0810 07/31/17 1244  BP: 129/79 111/62  Pulse: 70 71  Resp: 16 19  Temp: 97.8 F (36.6 C) (!) 97.5 F (36.4 C)  SpO2: 99% 98%     General: Well developed, well nourished, NAD, appears stated age  HEENT: NCAT, mucous membranes moist.  Cardiovascular: S1 S2 auscultated, RRR  Respiratory: Diminished breath sounds however are clear. No wheezing or crackles noted.  Abdomen: Soft, obese, nontender, nondistended, + bowel sounds  Extremities: warm dry without cyanosis clubbing. B/L LE edema with venous stasis skin changes  Neuro: AAOx3, nonfocal  Psych: Normal affect and demeanor, pleasant  Discharge Instructions Discharge Instructions    DME Nebulizer machine    Complete by:  As directed    Patient needs a nebulizer to treat with the following condition:  COPD (chronic obstructive pulmonary disease) ([place])   DME Nebulizer/meds    Complete by:  As directed    Patient needs a nebulizer to treat with the  following condition:  COPD (chronic obstructive pulmonary disease) ([place])   Discharge instructions    Complete by:  As directed    Patient will be discharged to home with home oxygen.  Patient will need to follow up with primary care provider within one week of discharge.  Patient should continue medications as prescribed.  Patient should follow a heart healthy/carb modified diet.     Current Discharge Medication List    START taking these medications   Details  doxycycline (VIBRAMYCIN) 100 MG capsule Take 1 capsule (100 mg total) by mouth 2 (two) times daily. Qty: 6 capsule, Refills: 0    predniSONE (DELTASONE) 20 MG tablet Take 3 tabs x 3 days, then 2 tabs x 3 days, then 1 tab x 3 days. Qty: 18 tablet, Refills: 0      CONTINUE these medications which have NOT CHANGED   Details  acetaminophen (TYLENOL) 500 MG tablet Take 500-1,000 mg by mouth 2 (two) times daily as needed for moderate pain.     albuterol (VENTOLIN HFA) 108 (90 Base) MCG/ACT inhaler Inhale 2 puffs into the lungs every 6 (six) hours as needed for wheezing or shortness of breath. Qty: 18 Inhaler, Refills: 1    ALPRAZolam (XANAX) 0.25 MG tablet TAKE 1 TABLET BY MOUTH 3 TIMES A DAY AS NEEDED FOR ANXIETY OR SLEEP Qty: 50 tablet, Refills: 0    amiodarone (PACERONE) 200 MG tablet Take 1 tablet (200 mg total) by mouth daily. Qty: 90 tablet, Refills: 3    apixaban (ELIQUIS) 5 MG TABS tablet Take 1 tablet (5 mg total) by mouth 2 (two) times daily. Qty: 180 tablet, Refills: 3    Ascorbic Acid (VITAMIN C) 500 MG tablet Take 500 mg by mouth daily.  brimonidine (ALPHAGAN P) 0.1 % SOLN Place 1 drop into the left eye 3 (three) times daily.     calcium carbonate (TUMS - DOSED IN MG ELEMENTAL CALCIUM) 500 MG chewable tablet Chew 2 tablets by mouth 2 (two) times daily as needed for indigestion or heartburn.    cholecalciferol (VITAMIN D) 1000 UNITS tablet Take 2,000 Units by mouth daily.     dextromethorphan (DELSYM) 30  MG/5ML liquid Take 90 mg by mouth 2 (two) times daily as needed for cough.    docusate sodium (COLACE) 100 MG capsule Take 100 mg by mouth daily as needed for mild constipation.    dorzolamide (TRUSOPT) 2 % ophthalmic solution Place 1 drop into both eyes 2 (two) times daily.    fluticasone (FLONASE) 50 MCG/ACT nasal spray USE 2 SPRAYS IN EACN NOSTRIL DAILY Qty: 16 g, Refills: 3    furosemide (LASIX) 40 MG tablet Take 1 tablet (40 mg total) by mouth daily. TAKE 1 TABLET BY MOUTH IN THE MORNING AND 1/2 TABLET IN THE EVENING Qty: 135 tablet, Refills: 3    guaifenesin (HUMIBID E) 400 MG TABS tablet Take 400 mg by mouth 2 (two) times daily as needed (for cough or congestion).    Lactobacillus (ACIDOPHILUS) CAPS capsule Take 1 capsule by mouth daily.    levothyroxine (SYNTHROID, LEVOTHROID) 75 MCG tablet Take 1 tablet (75 mcg total) by mouth daily before breakfast. Qty: 90 tablet, Refills: 3   Associated Diagnoses: Pure hypercholesterolemia; Iron deficiency anemia, unspecified iron deficiency anemia type; Controlled type 2 diabetes mellitus with stage 3 chronic kidney disease, with long-term current use of insulin ([place]); Dyspnea, unspecified type    metoprolol tartrate (LOPRESSOR) 25 MG tablet Take 1 tablet (25 mg total) by mouth 3 (three) times daily. Qty: 270 tablet, Refills: 3    Multiple Vitamin (MULTIVITAMIN WITH MINERALS) TABS tablet Take 1 tablet by mouth daily.    Netarsudil Dimesylate (RHOPRESSA) 0.02 % SOLN Place 1 drop into the left eye daily.    NOVOLIN R 100 UNIT/ML injection INJECT 3 TIMES A DAY (JUST BEFORE EACH MEAL)          320-250-200 AND140UNITS   WITH YOU BEDTIME SNACK Qty: 280 mL, Refills: 3   Associated Diagnoses: Pure hypercholesterolemia; Iron deficiency anemia due to chronic blood loss; Controlled type 2 diabetes mellitus with stage 3 chronic kidney disease, with long-term current use of insulin ([place]); Dyspnea, unspecified type    Omega-3 Fatty Acids (FISH OIL) 1000  MG CAPS Take 1,000 mg by mouth every morning.     omeprazole (PRILOSEC) 40 MG capsule Take 1 capsule (40 mg total) by mouth daily. Qty: 90 capsule, Refills: 1    Resveratrol 100 MG CAPS Take 100 mg by mouth daily.    rosuvastatin (CRESTOR) 40 MG tablet Take 20 mg by mouth every evening.    Timolol Maleate 0.5 % (DAILY) SOLN Place 1 drop into both eyes 2 (two) times daily.    traMADol (ULTRAM) 50 MG tablet Take 2 tablets (100 mg total) by mouth 2 (two) times daily as needed (pain). Qty: 60 tablet, Refills: 2    travoprost, benzalkonium, (TRAVATAN) 0.004 % ophthalmic solution Place 1 drop into the left eye at bedtime.       Allergies  Allergen Reactions  . Actos [Pioglitazone] Swelling  . Timolol Other (See Comments)    Slow heart rate but tolerates Cosopt (dorzolamide-timolol)   Follow-up Information    Renato Shin, MD. Schedule an appointment as soon as possible for a  visit in 1 week(s).   Specialty:  Endocrinology Why:  Hospital follow-up, discuss diabetes management Contact information: [address] [place] [address] [phone]        Thompson Grayer, MD. Go on 08/12/2017.   Specialty:  Cardiology Why:  at 12:15pm Contact information: [place] [address] [phone]            The results of significant diagnostics from this hospitalization (including imaging, microbiology, ancillary and laboratory) are listed below for reference.    Significant Diagnostic Studies: Dg Chest 2 View  Result Date: 07/28/2017 CLINICAL DATA:  One day history of chest tightness, shortness of breath, and cough. History of CHF, coronary artery disease, COPD. Former smoker. EXAM: CHEST  2 VIEW COMPARISON:  PA and lateral chest x-ray of November 04, 2016 FINDINGS: The lungs are well-expanded. The interstitial markings are coarse. The cardiac silhouette is enlarged. The central pulmonary vascularity is prominent. There is no pleural  effusion. There is calcification in the wall of the aortic arch. The ICD is in stable position. The observed bony thorax is unremarkable. IMPRESSION: COPD. Superimposed low-grade CHF. No alveolar pneumonia nor significant pleural effusion. Thoracic aortic atherosclerosis. Electronically Signed   By: David  Martinique M.D.   On: 07/28/2017 16:03    Microbiology: Recent Results (from the past 240 hour(s))  Culture, blood (routine x 2)     Status: None (Preliminary result)   Collection Time: 07/28/17  5:50 PM  Result Value Ref Range Status   Specimen Description BLOOD RIGHT HAND  Final   Special Requests   Final    BOTTLES DRAWN AEROBIC AND ANAEROBIC Blood Culture adequate volume   Culture NO GROWTH 2 DAYS  Final   Report Status PENDING  Incomplete  Culture, blood (routine x 2)     Status: None (Preliminary result)   Collection Time: 07/28/17  6:02 PM  Result Value Ref Range Status   Specimen Description BLOOD RIGHT FOREARM  Final   Special Requests   Final    BOTTLES DRAWN AEROBIC AND ANAEROBIC Blood Culture adequate volume   Culture NO GROWTH 2 DAYS  Final   Report Status PENDING  Incomplete  MRSA PCR Screening     Status: Abnormal   Collection Time: 07/29/17  1:07 AM  Result Value Ref Range Status   MRSA by PCR POSITIVE (A) NEGATIVE Final    Comment:        The GeneXpert MRSA Assay (FDA approved for NASAL specimens only), is one component of a comprehensive MRSA colonization surveillance program. It is not intended to diagnose MRSA infection nor to guide or monitor treatment for MRSA infections. RESULT CALLED TO, READ BACK BY AND VERIFIED WITH: CUMMINGS,B RN 0410 07/29/17 MITCHELL,L      Labs: Basic Metabolic Panel:  Recent Labs Lab 07/29/17 0618 07/29/17 1027 07/29/17 1455 07/30/17 0409 07/31/17 0037  NA 134* 134* 135 136 133*  K 3.9 4.0 4.1 4.0 3.9  CL 102 104 106 107 104  CO2 15* 18* 19* 21* 19*  GLUCOSE 470* 338* 250* 71 99  BUN 39* 39* 40* 40* 42*  CREATININE  2.03* 1.83* 1.81* 1.51* 1.49*  CALCIUM 8.4* 8.4* 8.4* 8.5* 8.3*   Liver Function Tests: No results for input(s): AST, ALT, ALKPHOS, BILITOT, PROT, ALBUMIN in the last 168 hours. No results for input(s): LIPASE, AMYLASE in the last 168 hours. No results for input(s): AMMONIA in the last 168 hours. CBC:  Recent Labs Lab 07/28/17  1530 07/29/17 0430 07/30/17 0409 07/31/17 0037  WBC 19.1* 18.5* 18.5* 16.4*  HGB 11.9* 11.6* 11.7* 12.0*  HCT 37.6* 36.0* 36.4* 36.8*  MCV 95.2 95.0 95.0 94.4  PLT 208 192 208 219   Cardiac Enzymes:  Recent Labs Lab 07/28/17 2030 07/30/17 1257 07/30/17 1845 07/31/17 0037  TROPONINI 0.05* 0.05* 0.05* 0.04*   BNP: BNP (last 3 results)  Recent Labs  07/28/17 1530  BNP 173.7*    ProBNP (last 3 results) No results for input(s): PROBNP in the last 8760 hours.  CBG:  Recent Labs Lab 07/31/17 0018 07/31/17 0351 07/31/17 0809 07/31/17 0959 07/31/17 1241  GLUCAP 106* 96 190* 250* 170*       Signed:  Heath Badon  [facility] 07/31/2017, 1:01 PM

## 2017-07-31 NOTE — Progress Notes (Signed)
Physical Therapy Treatment Patient Details Name: [name] MRN: [id number] DOB: [birth year] Today's Date: 07/31/2017    History of Present Illness Pt admitted with SOB, chest pressure and congestion. PMH: CAD, pacemaker, CKD, afib, OSA, morbid obesity, HTN, glaucoma, CHF, DM, COPD, bladder cancer, peripheral neuropathy.    PT Comments    Excellent progress with mobility. Pt able to ambulate 150 feet with RW compared to 50 feet previous session. Standing rest breaks needed to recover SpO2.    Follow Up Recommendations  No PT follow up;Supervision - Intermittent     Equipment Recommendations  None recommended by PT    Recommendations for Other Services       Precautions / Restrictions Precautions Precautions: Fall Precaution Comments: watch 02 sats    Mobility  Bed Mobility               General bed mobility comments: Pt in bathroom on arrival.  Transfers   Equipment used: Rolling walker (2 wheeled)   Sit to Stand: Min guard         General transfer comment: verbal cues for hand placement  Ambulation/Gait Ambulation/Gait assistance: Min guard;+2 safety/equipment Ambulation Distance (Feet): 150 Feet Assistive device: Rolling walker (2 wheeled) Gait Pattern/deviations: Step-through pattern;Trunk flexed;Decreased stride length;Wide base of support Gait velocity: decreased Gait velocity interpretation: Below normal speed for age/gender General Gait Details: Pt ambulated on 4 L O2. Desat to low 80s x 3 during ambulation requiring standing rest breaks with pursed lip breathing to recover to 90s. Sat at 98% after session in recliner.    Stairs            Wheelchair Mobility    Modified Rankin (Stroke Patients Only)       Balance     Sitting balance-Leahy Scale: Good       Standing balance-Leahy Scale: Poor Standing balance comment: reliant on walker to ambulate and at least one hand support when pulling up pants                            Cognition Arousal/Alertness: Awake/alert Behavior During Therapy: WFL for tasks assessed/performed Overall Cognitive Status: Within Functional Limits for tasks assessed                                        Exercises      General Comments        Pertinent Vitals/Pain Pain Assessment: No/denies pain    Home Living                      Prior Function            PT Goals (current goals can now be found in the care plan section) Acute Rehab PT Goals Patient Stated Goal: to return home with wife PT Goal Formulation: With patient Time For Goal Achievement: 08/05/17 Potential to Achieve Goals: Good Progress towards PT goals: Progressing toward goals    Frequency    Min 3X/week      PT Plan Current plan remains appropriate    Co-evaluation              AM-PAC PT "6 Clicks" Daily Activity  Outcome Measure  Difficulty turning over in bed (including adjusting bedclothes, sheets and blankets)?: A Little Difficulty moving from lying on back to sitting on the side of the  bed? : A Lot Difficulty sitting down on and standing up from a chair with arms (e.g., wheelchair, bedside commode, etc,.)?: A Little Help needed moving to and from a bed to chair (including a wheelchair)?: A Little Help needed walking in hospital room?: A Little Help needed climbing 3-5 steps with a railing? : A Lot 6 Click Score: 16    End of Session Equipment Utilized During Treatment: Oxygen Activity Tolerance: Patient tolerated treatment well Patient left: in chair;with call bell/phone within reach Nurse Communication: Mobility status PT Visit Diagnosis: Unsteadiness on feet (R26.81)     Time: 8891-6945 PT Time Calculation (min) (ACUTE ONLY): 23 min  Charges:  $Gait Training: 23-37 mins                    G Codes:       Lorrin Goodell, PT  Office # [phone] Pager [phone]    Lorriane Shire 07/31/2017, 10:44 AM

## 2017-07-31 NOTE — Progress Notes (Signed)
Occupational Therapy Treatment Patient Details Name: [name] MRN: [id number] DOB: 1941/04/25 Today's Date: 07/31/2017    History of present illness Pt admitted with SOB, chest pressure and congestion. PMH: CAD, pacemaker, CKD, afib, OSA, morbid obesity, HTN, glaucoma, CHF, DM, COPD, bladder cancer, peripheral neuropathy.   OT comments  Completed education regarding energy conservation. Pt safe to DC home with wife when medically stable. Suggested pt discuss use of scooter with physician after DC to increase his community access.  Follow Up Recommendations       Equipment Recommendations  None recommended by OT    Recommendations for Other Services      Precautions / Restrictions Precautions Precautions: Fall       Mobility Bed Mobility               General bed mobility comments: OOB in chair  Transfers Overall transfer level: Modified independent                    Balance                                           ADL either performed or assessed with clinical judgement   ADL                                         General ADL Comments: completed education regarding energy conservation for ADL. Wife present for education. Pt discussed possible use of scooter for community accessibility     Vision       Perception     Praxis      Cognition Arousal/Alertness: Awake/alert Behavior During Therapy: WFL for tasks assessed/performed Overall Cognitive Status: Within Functional Limits for tasks assessed                                          Exercises     Shoulder Instructions       General Comments      Pertinent Vitals/ Pain       Pain Assessment: No/denies pain  Home Living                                          Prior Functioning/Environment              Frequency           Progress Toward Goals  OT Goals(current goals can now be found in the  care plan section)  Progress towards OT goals: Goals met/education completed, patient discharged from OT  Acute Rehab OT Goals Patient Stated Goal: to return home with wife OT Goal Formulation: With patient Time For Goal Achievement: 08/12/17 Potential to Achieve Goals: Good ADL Goals Pt Will Perform Grooming: standing;with supervision Pt Will Transfer to Toilet: with supervision;ambulating;regular height toilet Pt Will Perform Toileting - Clothing Manipulation and hygiene: with supervision;sit to/from stand Pt Will Perform Tub/Shower Transfer: Shower transfer;with supervision;ambulating;shower seat;rolling walker Additional ADL Goal #1: Pt will be aware of AE for LB bathing and dressing. Additional ADL Goal #2: Pt will implement breathing strategies and energy conservation  during ADL and mobility.  Plan All goals met and education completed, patient discharged from OT services    Co-evaluation                 AM-PAC PT "6 Clicks" Daily Activity     Outcome Measure   Help from another person eating meals?: None Help from another person taking care of personal grooming?: None Help from another person toileting, which includes using toliet, bedpan, or urinal?: A Little Help from another person bathing (including washing, rinsing, drying)?: A Little Help from another person to put on and taking off regular upper body clothing?: None Help from another person to put on and taking off regular lower body clothing?: A Little 6 Click Score: 21    End of Session    OT Visit Diagnosis: Unsteadiness on feet (R26.81)   Activity Tolerance Patient tolerated treatment well   Patient Left in chair;with call bell/phone within reach;with family/visitor present   Nurse Communication Mobility status        Time: 2072-1828 OT Time Calculation (min): 12 min  Charges: OT General Charges $OT Visit: 1 Procedure OT Treatments $Self Care/Home Management : 8-22 mins  [facility], OT/L   [phone] 07/31/2017   Shawnay Bramel,H[place]ARY 07/31/2017, 4:40 PM

## 2017-07-31 NOTE — Progress Notes (Signed)
Pharmacy - Brief Progress Note  Approached by Justice Rocher, RN with a question regarding Mr. Melroy' morning insulin dose.  His CBG at that time was 190 and his order is for 300 units of Novolog with breakfast.  His current insulin regimen: Novolog 300 units with breakfast (not given 8/23 AM due to hypoglycemia) Novolog 240 units with lunch (given as ordered 8/23) Novolog 220 units with supper (given as ordered 8/23) Novolog 150 units at bedime (not given 8/23 PM due to hypoglycemia)  On 8/23 he only received 460 units of the 910 units that were ordered.  Paged Dr. Ree Kida to discuss.  Patient is very resistant to any changes in his insulin regimen and only wants Dr. [name] to make adjustments to his insulin.  Nursing has been uncomfortable giving his insulin as ordered when his CBGs are <200.  With variable CBGs it may not be appropriate to hold the entire insulin dose since it is so large but a decrease may be warranted.  We agreed that the RN should call the provider if the CBG is <150 before giving his insulin as ordered.  I have updated his insulin orders with this administration instruction and communicated the plan to his nurse.  Norva Riffle 07/31/2017, 10:16 AM

## 2017-07-31 NOTE — Discharge Instructions (Signed)
Acute Respiratory Distress Syndrome, Adult Acute respiratory distress syndrome is a life-threatening condition in which fluid collects in the lungs. This prevents the lungs from filling with air and passing oxygen into the blood. This can cause the lungs and other vital organs to fail. The condition usually develops following an infection, illness, surgery, or injury. What are the causes? This condition may be caused by:  An infection, such as sepsis or pneumonia.  A serious injury to the head or chest.  Severe bleeding from an injury.  A major surgery.  Breathing in harmful chemicals or smoke.  Blood transfusions.  A blood clot in the lungs.  Breathing in vomit (aspiration).  Near-drowning.  Inflammation of the pancreas (pancreatitis).  A drug overdose.  What are the signs or symptoms? Sudden shortness of breath and rapid breathing are the main symptoms of this condition. Other symptoms may include:  A fast or irregular heartbeat.  Skin, lips, or fingernails that look blue (cyanosis).  Confusion.  Tiredness or loss of energy.  Chest pain, particularly while taking a breath.  Coughing.  Restlessness or anxiety.  Fever. This is usually present if there is an underlying infection, such as pneumonia.  How is this diagnosed? This condition is diagnosed based on:  Your symptoms.  Medical history.  A physical exam. During the exam, your health care provider will listen to your heart and check for crackling or wheezing sounds in your lungs.  You may also have other tests to confirm the diagnosis and measure how well your lungs are working. These may include:  Measuring the amount of oxygen in your blood. Your health care provider will use two methods to do this procedure: ? A small device (pulse oximeter) that is placed on your finger, earlobe, or toe. ? An arterial blood gas test. A sample of blood is taken from an artery and tested for oxygen levels.  Blood  tests.  Chest X-rays or CT scans to look for fluid in the lungs.  Taking a sample of your sputum to test for infection.  Heart test, such as an echocardiogram or electrocardiogram. This is done to rule out any heart problems (such as heart failure) that may be causing your symptoms.  Bronchoscopy. During this test, a thin, flexible tube with a light is passed into the mouth or nose, down the windpipe, and into the lungs.  How is this treated? Treatment depends on the cause of your condition. The goal is to support you while your lungs heal and the underlying cause is treated. Treatment may include:  Oxygen therapy. This may be done through: ? A tube in your nose or a face mask. ? A ventilator. This device helps move air into and out of your lungs through a breathing tube that is inserted into your mouth or nose.  Continuous positive airway pressure (CPAP). This treatment uses mild air pressure to keep the airways open. A mask or other device will be placed over your nose or mouth.  Tracheostomy. During this procedure, a small cut is made in your neck to create an opening to your windpipe. A breathing tube is placed directly into your windpipe. The breathing tube is connected to a ventilator. This is done if you have problems with your airway or if you need a ventilator for a long period of time.  Positioning you to lie on your stomach (prone position).  Medicines, such as: ? Sedatives to help you relax. ? Blood pressure medicines. ? Antibiotics to treat  infection. ? Blood thinners to prevent blood clots. ? Diuretics to help prevent excess fluid.  Fluids and nutrients given through an IV tube.  Wearing compression stockings on your legs to prevent blood clots.  Extra corporeal membrane oxygenation (ECMO). This treatment takes blood outside your body, adds oxygen, and removes carbon dioxide. The blood is then returned to your body. This treatment is only used in severe  cases.  Follow these instructions at home:  Take over-the-counter and prescription medicines only as told by your health care provider.  Do not use any products that contain nicotine or tobacco, such as cigarettes and e-cigarettes. If you need help quitting, ask your health care provider.  Limit alcohol intake to no more than 1 drink per day for nonpregnant women and 2 drinks per day for men. One drink equals 12 oz of beer, 5 oz of wine, or 1 oz of hard liquor.  Ask friends and family to help you if daily activities make you tired.  Attend any pulmonary rehabilitation as told by your health care provider. This may include: ? Education about your condition. ? Exercises. ? Breathing training. ? Counseling. ? Learning techniques to conserve energy. ? Nutrition counseling.  Keep all follow-up visits as told by your health care provider. This is important. Contact a health care provider if:  You become short of breath during activity or while resting.  You develop a cough that does not go away.  You have a fever.  Your symptoms do not get better or they get worse.  You become anxious or depressed. Get help right away if:  You have sudden shortness of breath.  You develop sudden chest pain that does not go away.  You develop a rapid heart rate.  You develop swelling or pain in one of your legs.  You cough up blood.  You have trouble breathing.  Your skin, lips, or fingernails turn blue. These symptoms may represent a serious problem that is an emergency. Do not wait to see if the symptoms will go away. Get medical help right away. Call your local emergency services (911 in the [place] Do not drive yourself to the hospital. Summary  Acute respiratory distress syndrome is a life-threatening condition in which fluid collects in the lungs, which leads the lungs and other vital organs to fail.  This condition usually develops following an infection, illness, surgery, or  injury.  Sudden shortness of breath and rapid breathing are the main symptoms of acute respiratory distress syndrome.  Treatment may include oxygen therapy, continuous positive airway pressure (CPAP), tracheostomy, lying on your stomach (prone position), medicines, fluids and nutrients given through an IV tube, compression stockings, and extra corporeal membrane oxygenation (ECMO). This information is not intended to replace advice given to you by your health care provider. Make sure you discuss any questions you have with your health care provider. Document Released: 11/24/2005 Document Revised: 11/10/2016 Document Reviewed: 11/10/2016 Elsevier Interactive Patient Education  2017 [employer]  Chronic Obstructive Pulmonary Disease Chronic obstructive pulmonary disease (COPD) is a long-term (chronic) lung problem. When you have COPD, it is hard for air to get in and out of your lungs. The way your lungs work will never return to normal. Usually the condition gets worse over time. There are things you can do to keep yourself as healthy as possible. Your doctor may treat your condition with:  Medicines.  Quitting smoking, if you smoke.  Rehabilitation. This may involve a team of specialists.  Oxygen.  Exercise and changes to your diet.  Lung surgery.  Comfort measures (palliative care).  Follow these instructions at home: Medicines  Take over-the-counter and prescription medicines only as told by your doctor.  Talk to your doctor before taking any cough or allergy medicines. You may need to avoid medicines that cause your lungs to be dry. Lifestyle  If you smoke, stop. Smoking makes the problem worse. If you need help quitting, ask your doctor.  Avoid being around things that make your breathing worse. This may include smoke, chemicals, and fumes.  Stay active, but remember to also rest.  Learn and use tips on how to relax.  Make sure you get enough sleep. Most adults need  at least 7 hours a night.  Eat healthy foods. Eat smaller meals more often. Rest before meals. Controlled breathing  Learn and use tips on how to control your breathing as told by your doctor. Try: ? Breathing in (inhaling) through your nose for 1 second. Then, pucker your lips and breath out (exhale) through your lips for 2 seconds. ? Putting one hand on your belly (abdomen). Breathe in slowly through your nose for 1 second. Your hand on your belly should move out. Pucker your lips and breathe out slowly through your lips. Your hand on your belly should move in as you breathe out. Controlled coughing  Learn and use controlled coughing to clear mucus from your lungs. The steps are: 1. Lean your head a little forward. 2. Breathe in deeply. 3. Try to hold your breath for 3 seconds. 4. Keep your mouth slightly open while coughing 2 times. 5. Spit any mucus out into a tissue. 6. Rest and do the steps again 1 or 2 times as needed. General instructions  Make sure you get all the shots (vaccines) that your doctor recommends. Ask your doctor about a flu shot and a pneumonia shot.  Use oxygen therapy and therapy to help improve your lungs (pulmonary rehabilitation) if told by your doctor. If you need home oxygen therapy, ask your doctor if you should buy a tool to measure your oxygen level (oximeter).  Make a COPD action plan with your doctor. This helps you know what to do if you feel worse than usual.  Manage any other conditions you have as told by your doctor.  Avoid going outside when it is very hot, cold, or humid.  Avoid people who have a sickness you can catch (contagious).  Keep all follow-up visits as told by your doctor. This is important. Contact a doctor if:  You cough up more mucus than usual.  There is a change in the color or thickness of the mucus.  It is harder to breathe than usual.  Your breathing is faster than usual.  You have trouble sleeping.  You need to  use your medicines more often than usual.  You have trouble doing your normal activities such as getting dressed or walking around the house. Get help right away if:  You have shortness of breath while resting.  You have shortness of breath that stops you from: ? Being able to talk. ? Doing normal activities.  Your chest hurts for longer than 5 minutes.  Your skin color is more blue than usual.  Your pulse oximeter shows that you have low oxygen for longer than 5 minutes.  You have a fever.  You feel too tired to breathe normally. Summary  Chronic obstructive pulmonary disease (COPD) is a long-term lung problem.  The way  your lungs work will never return to normal. Usually the condition gets worse over time. There are things you can do to keep yourself as healthy as possible.  Take over-the-counter and prescription medicines only as told by your doctor.  If you smoke, stop. Smoking makes the problem worse. This information is not intended to replace advice given to you by your health care provider. Make sure you discuss any questions you have with your health care provider. Document Released: 05/12/2008 Document Revised: 05/01/2016 Document Reviewed: 07/21/2013 Elsevier Interactive Patient Education  2017 [employer].

## 2017-08-02 LAB — CULTURE, BLOOD (ROUTINE X 2)
Culture: NO GROWTH
Culture: NO GROWTH
Special Requests: ADEQUATE
Special Requests: ADEQUATE

## 2017-08-03 ENCOUNTER — Ambulatory Visit (INDEPENDENT_AMBULATORY_CARE_PROVIDER_SITE_OTHER): Payer: Medicare HMO | Admitting: Endocrinology

## 2017-08-03 ENCOUNTER — Encounter: Payer: Self-pay | Admitting: Endocrinology

## 2017-08-03 VITALS — BP 114/68 | HR 70 | Wt 314.4 lb

## 2017-08-03 DIAGNOSIS — E669 Obesity, unspecified: Secondary | ICD-10-CM | POA: Diagnosis not present

## 2017-08-03 DIAGNOSIS — E1169 Type 2 diabetes mellitus with other specified complication: Secondary | ICD-10-CM | POA: Diagnosis not present

## 2017-08-03 DIAGNOSIS — D509 Iron deficiency anemia, unspecified: Secondary | ICD-10-CM

## 2017-08-03 DIAGNOSIS — G4733 Obstructive sleep apnea (adult) (pediatric): Secondary | ICD-10-CM | POA: Diagnosis not present

## 2017-08-03 LAB — POCT GLYCOSYLATED HEMOGLOBIN (HGB A1C): Hemoglobin A1C: 7.8

## 2017-08-03 NOTE — Progress Notes (Signed)
Subjective:    Patient ID: [name], male    DOB: 1941-07-08, 76 y.o.   MRN: [id number]  HPI Pt returns for f/u of diabetes mellitus:  DM type: Insulin-requiring type 2.  Dx'ed: 1988.  Complications: renal insufficiency, polyneuropathy, and CAD.   Therapy: insulin since 1989.  DKA: never.   Severe hypoglycemia: never.   Pancreatitis: several episodes, most recently in 2013.  Other: He was recently in the hospital for resp failure.  He brings a record of his cbg's which I have reviewed today.  It varies from 41-376.  It varies widely throughout the day, but is still lowest at supper, and at HS.   Past Medical History:  Diagnosis Date  . Anemia    hx of  . Anxiety   . CAD (coronary artery disease)   . Cancer [facility]) 2014   bladder cancer AND RIGHT URETERAL CANCER  . COPD (chronic obstructive pulmonary disease) (HCC)    CPAP  . Depression   . Diabetes mellitus    TypeII  . Diastolic dysfunction with chronic heart failure ([place])   . Gait difficulty    slow gait"ambulates with walker"  . GERD (gastroesophageal reflux disease)   . 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 H/O Legionnaire's disease 2003  . History of blood clots    R groin  . History of kidney stones   . Hyperkalemia   . Hyperlipemia   . Hypertensive cardiovascular-renal disease   . Hypothyroidism   . Morbid obesity ([place])   . OSA on CPAP    using CPAP although it is hard for him to tolerate  . Osteoarthritis    fingers  . Pancreatitis   . Peripheral neuropathy   . Persistent atrial fibrillation ([place])   . Pneumonia 2003  . Renal insufficiency   . Shortness of breath dyspnea    with exertion  . Sick sinus syndrome ([place]) 06/2010   s/p PPM by [employer]  . Venous insufficiency     Past Surgical History:  Procedure Laterality Date  . BELPHAROPTOSIS REPAIR Right    Glaucoma  . CARDIAC CATHETERIZATION     11-02-13  . CARDIOVERSION N/A 05/16/2014   Procedure: CARDIOVERSION;  Surgeon: Josue Hector, MD;  Location: [facility] ENDOSCOPY;  Service: Cardiovascular;  Laterality: N/A;  . CARDIOVERSION N/A 11/08/2014   Procedure: CARDIOVERSION;  Surgeon: Fay Records, MD;  Location: [place];  Service: Cardiovascular;  Laterality: N/A;  . CARDIOVERSION N/A 06/22/2015   Procedure: CARDIOVERSION;  Surgeon: Jerline Pain, MD;  Location: [place];  Service: Cardiovascular;  Laterality: N/A;  . CHOLECYSTECTOMY  2012  . CYSTOSCOPY W/ URETERAL STENT PLACEMENT Right 10/26/2013   Procedure: CYSTOSCOPY WITH RETROGRADE PYELOGRAM/URETERAL STENT PLACEMENT;  Surgeon: Alexis Frock, MD;  Location: [facility];  Service: Urology;  Laterality: Right;  . CYSTOSCOPY WITH URETEROSCOPY AND STENT PLACEMENT Right 01/11/2014   Procedure: CYSTOSCOPY WITH URETEROSCOPY ,RIGHT RETROGRADE AND STENT CHANGE AND LASER OF URETERAL TUMOR;  Surgeon: Alexis Frock, MD;  Location: [facility];  Service: Urology;  Laterality: Right;  . CYSTOSCOPY/RETROGRADE/URETEROSCOPY Right 08/23/2014   Procedure: CYSTOSCOPY/RETROGRADE/ DIAGNOSTIC URETEROSCOPY/RIGHT RENAL STONE EXTRACTION;  Surgeon: Alexis Frock, MD;  Location: [facility];  Service: Urology;  Laterality: Right;  . EMBOLECTOMY Left 11/02/2013   Procedure: LEFT FEMORAL EMBOLECTOMY, LEFT FEMORAL ARTERY ENDARTERECTOMY WITH DACRON PATCH ANGIOPLASTY.;  Surgeon: Mal Misty, MD;  Location: [place];  Service: Vascular;  Laterality: Left;  . EYE SURGERY  Left    cataract  . GROIN DEBRIDEMENT Right 05/08/2015   Procedure: REMOVAL OF RIGHT GROIN MASS;  Surgeon: Angelia Mould, MD;  Location: [place];  Service: Vascular;  Laterality: Right;  . HOLMIUM LASER APPLICATION Right 07/12/276   Procedure: HOLMIUM LASER APPLICATION;  Surgeon: Alexis Frock, MD;  Location: [facility];  Service: Urology;  Laterality: Right;  . INSERT / REPLACE / REMOVE PACEMAKER  2011  . NASAL SEPTUM SURGERY  1967  . PACEMAKER PLACEMENT  06/2010   [facility] [employer] RF DR, Model [id number] ( Serial number [id number])  . TEE  WITHOUT CARDIOVERSION N/A 05/16/2014   Procedure: TRANSESOPHAGEAL ECHOCARDIOGRAM (TEE);  Surgeon: Josue Hector, MD;  Location: [facility] ENDOSCOPY;  Service: Cardiovascular;  Laterality: N/A;  . thromboembolectomy and four compartment fasciotomy Right 2009   leg  . TRANSURETHRAL RESECTION OF BLADDER TUMOR WITH GYRUS (TURBT-GYRUS) N/A 10/26/2013   Procedure: TRANSURETHRAL RESECTION OF BLADDER TUMOR WITH GYRUS (TURBT-GYRUS);  Surgeon: Alexis Frock, MD;  Location[facility];  Service: Urology;  Laterality: N/A;  . TRANSURETHRAL RESECTION OF BLADDER TUMOR WITH GYRUS (TURBT-GYRUS) N/A 01/11/2014   Procedure: TRANSURETHRAL RESECTION OF BLADDER TUMOR WITH GYRUS (TURBT-GYRUS);  Surgeon: Alexis Frock, MD;  Location[facility];  Service: Urology;  Laterality: N/A;  . WISDOM TOOTH EXTRACTION      Social History   Social History  . Marital status: Married    Spouse name: N/A  . Number of children: N/A  . Years of education: N/A   Occupational History  . 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 Smoking status: Former Smoker    Packs/day: 2.00    Years: 50.00    Types: Cigarettes    Quit date: 12/08/2006  . Smokeless tobacco: Never Used  . Alcohol use No     Comment: quit 3 years ago  . Drug use: No  . Sexual activity: Not Currently   Other Topics Concern  . Not on file   Social History Narrative  . No narrative on file    Current Outpatient Prescriptions on File Prior to Visit  Medication Sig Dispense Refill  . acetaminophen (TYLENOL) 500 MG tablet Take 500-1,000 mg by mouth 2 (two) times daily as needed for moderate pain.     Marland Kitchen albuterol (PROVENTIL) (2.5 MG/3ML) 0.083% nebulizer solution Take 3 mLs (2.5 mg total) by nebulization every 4 (four) hours as needed for wheezing or shortness of breath. 75 mL 1  . albuterol (VENTOLIN HFA) 108 (90 Base) MCG/ACT inhaler Inhale 2 puffs into the lungs every 6 (six) hours as needed for wheezing or shortness of breath. 18 Inhaler 1   . ALPRAZolam (XANAX) 0.25 MG tablet TAKE 1 TABLET BY MOUTH 3 TIMES A DAY AS NEEDED FOR ANXIETY OR SLEEP 50 tablet 0  . amiodarone (PACERONE) 200 MG tablet Take 1 tablet (200 mg total) by mouth daily. 90 tablet 3  . apixaban (ELIQUIS) 5 MG TABS tablet Take 1 tablet (5 mg total) by mouth 2 (two) times daily. 180 tablet 3  . Ascorbic Acid (VITAMIN C) 500 MG tablet Take 500 mg by mouth daily.     . brimonidine (ALPHAGAN P) 0.1 % SOLN Place 1 drop into the left eye 3 (three) times daily.     . calcium carbonate (TUMS - DOSED IN MG ELEMENTAL CALCIUM) 500 MG chewable tablet Chew 2 tablets by mouth 2 (two) times daily as needed for indigestion or heartburn.    Marland Kitchen  cholecalciferol (VITAMIN D) 1000 UNITS tablet Take 2,000 Units by mouth daily.     Marland Kitchen dextromethorphan (DELSYM) 30 MG/5ML liquid Take 90 mg by mouth 2 (two) times daily as needed for cough.    . docusate sodium (COLACE) 100 MG capsule Take 100 mg by mouth daily as needed for mild constipation.    . dorzolamide (TRUSOPT) 2 % ophthalmic solution Place 1 drop into both eyes 2 (two) times daily.    Marland Kitchen doxycycline (VIBRAMYCIN) 100 MG capsule Take 1 capsule (100 mg total) by mouth 2 (two) times daily. 6 capsule 0  . fluticasone (FLONASE) 50 MCG/ACT nasal spray USE 2 SPRAYS IN EACN NOSTRIL DAILY 16 g 3  . furosemide (LASIX) 40 MG tablet Take 1 tablet (40 mg total) by mouth daily. TAKE 1 TABLET BY MOUTH IN THE MORNING AND 1/2 TABLET IN THE EVENING (Patient taking differently: Take 40 mg by mouth See admin instructions. Takes 40 mg in the morning and 20 mg in the evening) 135 tablet 3  . guaifenesin (HUMIBID E) 400 MG TABS tablet Take 400 mg by mouth 2 (two) times daily as needed (for cough or congestion).    Marland Kitchen ipratropium-albuterol (DUONEB) 0.5-2.5 (3) MG/3ML SOLN Take 3 mLs by nebulization every 4 (four) hours as needed. 360 mL 1  . Lactobacillus (ACIDOPHILUS) CAPS capsule Take 1 capsule by mouth daily.    Marland Kitchen levothyroxine (SYNTHROID, LEVOTHROID) 75 MCG  tablet Take 1 tablet (75 mcg total) by mouth daily before breakfast. 90 tablet 3  . metoprolol tartrate (LOPRESSOR) 25 MG tablet Take 1 tablet (25 mg total) by mouth 3 (three) times daily. 270 tablet 3  . Multiple Vitamin (MULTIVITAMIN WITH MINERALS) TABS tablet Take 1 tablet by mouth daily.    Mckinley Jewel Dimesylate (RHOPRESSA) 0.02 % SOLN Place 1 drop into the left eye daily.    Marland Kitchen NOVOLIN R 100 UNIT/ML injection INJECT 3 TIMES A DAY (JUST BEFORE EACH MEAL)          320-250-200 AND140UNITS   WITH YOU BEDTIME SNACK (Patient taking differently: INJECT 4 TIMES A DAY (JUST BEFORE EACH MEAL)          300 units in the morning, 240 units at noon, 220 units  at supper,  and 150 units at bedtime.) 280 mL 3  . Omega-3 Fatty Acids (FISH OIL) 1000 MG CAPS Take 1,000 mg by mouth every morning.     Marland Kitchen omeprazole (PRILOSEC) 40 MG capsule Take 1 capsule (40 mg total) by mouth daily. 90 capsule 1  . predniSONE (DELTASONE) 20 MG tablet Take 3 tabs x 3 days, then 2 tabs x 3 days, then 1 tab x 3 days. 18 tablet 0  . Resveratrol 100 MG CAPS Take 100 mg by mouth daily.    . rosuvastatin (CRESTOR) 40 MG tablet Take 20 mg by mouth every evening.    . Timolol Maleate 0.5 % (DAILY) SOLN Place 1 drop into both eyes 2 (two) times daily.    . traMADol (ULTRAM) 50 MG tablet Take 2 tablets (100 mg total) by mouth 2 (two) times daily as needed (pain). 60 tablet 2  . travoprost, benzalkonium, (TRAVATAN) 0.004 % ophthalmic solution Place 1 drop into the left eye at bedtime.     No current facility-administered medications on file prior to visit.     Allergies  Allergen Reactions  . Actos [Pioglitazone] Swelling  . Timolol Other (See Comments)    Slow heart rate but tolerates Cosopt (dorzolamide-timolol)    Family  History  Problem Relation Age of Onset  . Liver cancer Mother        deceased age 52  . Cancer Mother        liver cancer  . Heart attack Father   . Colon cancer Neg Hx     BP 114/68   Pulse 70   Wt (!)  314 lb 6.4 oz (142.6 kg)   SpO2 97%   BMI 45.11 kg/m   Review of Systems Denies LOC.     Objective:   Physical Exam VITAL SIGNS:  See vs page.  GENERAL: no distress.  LUNGS:  Clear to auscultation. Pulses: dorsalis pedis absent bilat (prob due to edema).  MSK: no deformity of the feet or ankles.  CV: no edema of the legs or ankles.  Skin:  no ulcer on the feet or ankles.  normal color and temp on the feet and ankles. Several healed surgical scars at the right leg. Neuro: sensation is intact to touch on the feet and ankles.  Ext: There is bilateral onychomycosis of the toenails.   Lab Results  Component Value Date   HGBA1C 7.8 08/03/2017      Assessment & Plan:  Sleep apnea, due for f/u.   Insulin-requiring type 2 DM, with: this is the best control this pt should aim for, given variable cbg's.  We discussed the fact that lantus is inappropriate here.  resp failure: improved.    Patient Instructions  Please see a lung specialist.  you will receive a phone call, about a day and time for an appointment.   You can stop the oxygen. Please continue the same insulin. check your blood sugar twice a day.  vary the time of day when you check, between before the 3 meals, and at bedtime.  also check if you have symptoms of your blood sugar being too high or too low.  please keep a record of the readings and bring it to your next appointment here (or you can bring the meter itself).  You can write it on any piece of paper.  please call us sooner if your blood sugar goes below 70, or if you have a lot of readings over 200. Please come back for a follow-up appointment in 2 months.

## 2017-08-03 NOTE — Patient Instructions (Addendum)
Please see a lung specialist.  you will receive a phone call, about a day and time for an appointment.   You can stop the oxygen. Please continue the same insulin. check your blood sugar twice a day.  vary the time of day when you check, between before the 3 meals, and at bedtime.  also check if you have symptoms of your blood sugar being too high or too low.  please keep a record of the readings and bring it to your next appointment here (or you can bring the meter itself).  You can write it on any piece of paper.  please call us sooner if your blood sugar goes below 70, or if you have a lot of readings over 200. Please come back for a follow-up appointment in 2 months.

## 2017-08-04 ENCOUNTER — Other Ambulatory Visit: Payer: Self-pay | Admitting: Pharmacist

## 2017-08-04 LAB — BASIC METABOLIC PANEL
BUN: 47 mg/dL — AB (ref 6–23)
CHLORIDE: 104 meq/L (ref 96–112)
CO2: 24 meq/L (ref 19–32)
CREATININE: 1.68 mg/dL — AB (ref 0.40–1.50)
Calcium: 9.5 mg/dL (ref 8.4–10.5)
GFR: 42.63 mL/min — ABNORMAL LOW (ref >60.00)
Glucose, Bld: 45 mg/dL — CL (ref 70–99)
POTASSIUM: 4.3 meq/L (ref 3.5–5.1)
Sodium: 139 mEq/L (ref 135–145)

## 2017-08-04 LAB — CBC WITH DIFFERENTIAL/PLATELET
BASOS PCT: 0.3 % (ref 0.0–3.0)
Basophils Absolute: 0 10*3/uL (ref 0.0–0.1)
EOS ABS: 0 10*3/uL (ref 0.0–0.7)
EOS PCT: 0.1 % (ref 0.0–5.0)
HEMATOCRIT: 42.3 % (ref 39.0–52.0)
HEMOGLOBIN: 13.9 g/dL (ref 13.0–17.0)
LYMPHS PCT: 16.7 % (ref 12.0–46.0)
Lymphs Abs: 2.4 10*3/uL (ref 0.7–4.0)
MCHC: 32.9 g/dL (ref 30.0–36.0)
MCV: 95.3 fl (ref 78.0–100.0)
MONO ABS: 1 10*3/uL (ref 0.1–1.0)
Monocytes Relative: 6.6 % (ref 3.0–12.0)
NEUTROS ABS: 11 10*3/uL — AB (ref 1.4–7.7)
Neutrophils Relative %: 76.3 % (ref 43.0–77.0)
Platelets: 321 10*3/uL (ref 150.0–400.0)
RBC: 4.43 Mil/uL (ref 4.22–5.81)
RDW: 15.9 % — ABNORMAL HIGH (ref 11.5–15.5)
WBC: 14.5 10*3/uL — AB (ref 4.0–10.5)

## 2017-08-04 LAB — VITAMIN D 25 HYDROXY (VIT D DEFICIENCY, FRACTURES): VITD: 46.99 ng/mL (ref 30.00–100.00)

## 2017-08-04 LAB — IBC PANEL
Iron: 43 ug/dL (ref 42–165)
SATURATION RATIOS: 9.4 % — AB (ref 20.0–50.0)
Transferrin: 327 mg/dL (ref 212.0–360.0)

## 2017-08-04 NOTE — Patient Outreach (Signed)
[facility]) Care Management  08/04/2017  [name] 1941-10-18 [id number]   76 year old male referred to [place] [facility] by inpatient pharmacist Legrand Como for medication assistance with insulin.   PMHx includes, but not limited to, pancreatitis, type 2 diabetes, chronic kidney disease, CAD, atrial fibrillation, diastolic heart failure (GG=26-94% 8/'18), COPD, depression, sick sinus syndrome, HLD,HTN, history of blood clots, hypothyroidism, and history of bladder and right ureteral cancer.  Noted recent office visit with Dr. [name], endocrinologist, for diabetes follow-up.     Subjective:  Patient reports that U-500 insulin was not covered by previous insurance, [employer], in 2017.  He changed to [employer] in 2018 and was told that U-500 insulin was not covered again.    Objective: Medications Reviewed Today    Reviewed by Rudean Haskell, RPH (Pharmacist) on 08/04/17 at [place] List Status: <None>  Medication Order Taking? Sig Documenting Provider Last Dose Status Informant  acetaminophen (TYLENOL) 500 MG tablet [id number] Yes Take 500-1,000 mg by mouth 2 (two) times daily as needed for moderate pain.  [provider] Taking Active Spouse/Significant Other  albuterol (PROVENTIL) (2.5 MG/3ML) 0.083% nebulizer solution [id number] No Take 3 mLs (2.5 mg total) by nebulization every 4 (four) hours as needed for wheezing or shortness of breath.  Patient not taking:  Reported on 08/04/2017   Cristal Ford, DO Not Taking Active   albuterol (VENTOLIN HFA) 108 (90 Base) MCG/ACT inhaler [id number] Yes Inhale 2 puffs into the lungs every 6 (six) hours as needed for wheezing or shortness of breath. Renato Shin, MD Taking Active Spouse/Significant Other  ALPRAZolam Duanne Moron) 0.25 MG tablet [id number] Yes TAKE 1 TABLET BY MOUTH 3 TIMES A DAY AS NEEDED FOR ANXIETY OR SLEEP Renato Shin, MD Taking Active Spouse/Significant Other  amiodarone (PACERONE) 200 MG tablet  [id number] Yes Take 1 tablet (200 mg total) by mouth daily. Burtis Junes, NP Taking Active Spouse/Significant Other  apixaban (ELIQUIS) 5 MG TABS tablet [id number] Yes Take 1 tablet (5 mg total) by mouth 2 (two) times daily. Burtis Junes, NP Taking Active Spouse/Significant Other  Ascorbic Acid (VITAMIN C) 500 MG tablet [id number] Yes Take 500 mg by mouth daily.  [provider] Taking Active Spouse/Significant Other  brimonidine (ALPHAGAN P) 0.1 % SOLN [id number] Yes Place 1 drop into the left eye 3 (three) times daily.  [provider] Taking Active Spouse/Significant Other  calcium carbonate (TUMS - DOSED IN MG ELEMENTAL CALCIUM) 500 MG chewable tablet [id number] Yes Chew 2 tablets by mouth 2 (two) times daily as needed for indigestion or heartburn. [provider] Taking Active Spouse/Significant Other  cholecalciferol (VITAMIN D) 1000 UNITS tablet [id number] Yes Take 1,000 Units by mouth 2 (two) times daily.  [provider] Taking Active Spouse/Significant Other  dextromethorphan (DELSYM) 30 MG/5ML liquid [id number] Yes Take 90 mg by mouth 2 (two) times daily as needed for cough. [provider] Taking Active Spouse/Significant Other  docusate sodium (COLACE) 100 MG capsule [id number] Yes Take 100 mg by mouth daily as needed for mild constipation. [provider] Taking Active Spouse/Significant Other  dorzolamide (TRUSOPT) 2 % ophthalmic solution [id number] Yes Place 1 drop into both eyes 2 (two) times daily. [provider] Taking Active Spouse/Significant Other  doxycycline (VIBRAMYCIN) 100 MG capsule [id number] Yes Take 1 capsule (100 mg total) by mouth 2 (two) times daily. Cristal Ford, DO Taking Active            Med Note (Elianny Buxbaum E  Tue Aug 04, 2017  4:16 PM) Patient reports last day is 08/05/2017  fluticasone (FLONASE) 50 MCG/ACT nasal spray [id number] Yes USE 2 SPRAYS IN EACN NOSTRIL DAILY Renato Shin, MD Taking Active  Spouse/Significant Other           Med Note Iva Lento, [place] Aug 04, 2017  4:16 PM) Taking PRN congestion  furosemide (LASIX) 40 MG tablet [id number] Yes Take 1 tablet (40 mg total) by mouth daily. TAKE 1 TABLET BY MOUTH IN THE MORNING AND 1/2 TABLET IN THE EVENING  Patient taking differently:  Take 40 mg by mouth See admin instructions. Takes 40 mg in the morning and 20 mg in the evening   Burtis Junes, NP Taking Active Spouse/Significant Other  guaifenesin (HUMIBID E) 400 MG TABS tablet [id number] Yes Take 400 mg by mouth 2 (two) times daily as needed (for congestion).  [provider] Taking Active Spouse/Significant Other  ipratropium-albuterol (DUONEB) 0.5-2.5 (3) MG/3ML SOLN [id number] No Take 3 mLs by nebulization every 4 (four) hours as needed.  Patient not taking:  Reported on 08/04/2017   Cristal Ford, DO Not Taking Active   Lactobacillus (ACIDOPHILUS) CAPS capsule [id number] Yes Take 1 capsule by mouth daily. [provider] Taking Active Spouse/Significant Other  levothyroxine (SYNTHROID, LEVOTHROID) 75 MCG tablet [id number] Yes Take 1 tablet (75 mcg total) by mouth daily before breakfast. Renato Shin, MD Taking Active Spouse/Significant Other  metoprolol tartrate (LOPRESSOR) 25 MG tablet [id number] Yes Take 1 tablet (25 mg total) by mouth 3 (three) times daily. Burtis Junes, NP Taking Active Spouse/Significant Other  Multiple Vitamin (MULTIVITAMIN WITH MINERALS) TABS tablet [id number] Yes Take 1 tablet by mouth daily. [provider] Taking Active Spouse/Significant Other  Netarsudil Dimesylate (RHOPRESSA) 0.02 % SOLN [id number] Yes Place 1 drop into the left eye daily. [provider] Taking Active Spouse/Significant Other  NOVOLIN R 100 UNIT/ML injection [id number] Yes INJECT 3 TIMES A DAY (JUST BEFORE EACH MEAL)          320-250-200 AND140UNITS   WITH YOU BEDTIME SNACK  Patient taking differently:  INJECT 4 TIMES A DAY (JUST BEFORE EACH  MEAL)          300 units in the morning, 240 units at noon, 220 units  at supper,  and 150 units at bedtime.   Renato Shin, MD Taking Active Spouse/Significant Other  Omega-3 Fatty Acids (FISH OIL) 1000 MG CAPS [id number] Yes Take 1,000 mg by mouth every morning.  [provider] Taking Active Spouse/Significant Other  omeprazole (PRILOSEC) 40 MG capsule [id number] Yes Take 1 capsule (40 mg total) by mouth daily. Renato Shin, MD Taking Active Spouse/Significant Other  predniSONE (DELTASONE) 20 MG tablet [id number] Yes Take 3 tabs x 3 days, then 2 tabs x 3 days, then 1 tab x 3 days. Cristal Ford, DO Taking Active            Med Note Iva Lento, [place] Aug 04, 2017  4:19 PM) Patient reports he will finish 08/05/2017        Discontinued 08/04/17 1620 (Completed Course)   rosuvastatin (CRESTOR) 40 MG tablet [id number] Yes Take 20 mg by mouth every evening. [provider] Taking Active Spouse/Significant Other  Timolol Maleate 0.5 % (DAILY) SOLN [id number] Yes Place 1 drop into both eyes 2 (two) times daily. [provider] Taking Active Spouse/Significant Other  traMADol (ULTRAM) 50 MG tablet [id number] Yes Take 2 tablets (100 mg total) by mouth 2 (two) times  daily as needed (pain). Renato Shin, MD Taking Active Spouse/Significant Other  travoprost, benzalkonium, (TRAVATAN) 0.004 % ophthalmic solution [id number] Yes Place 1 drop into the left eye at bedtime. [provider] Taking Active Spouse/Significant Other         Assessment: Telephonic medication reconciliation performed  Drugs sorted by system:  Neurologic/Psychologic:Alprazolam  Cardiovascular:Amiodarone, apixaban, furosemide, metoprolol, omega-3 fatty acids, rosuvastatin  Pulmonary/Allergy: Albuterol, PRN dextromethorphan, PRN guaifenesin, PRN fluticasone NS, omeprazole  Gastrointestinal:Calcium carbonate, docusate, acidophilus  Endocrine:Levothyroxine, novolin R,  prednisone  Topical:Brimonidine, dodrzolamide, netasudil dimesylate, timolol, travoprost  Pain: Acetaminophen, tramadol  Vitamins/Minerals: Ascorbic acid, cholecalciferol, MVI  Infectious Diseases: Doxycycline  Medication assistance / management: Patient currently taking 910 units of insulin daily and may benefit from concentrated U-500 insulin. He is familiar with U-500 as he was previously on this formulation.  Per [employer], U-500 is covered if a prior authorization submitted (telephone to call: [phone]).  Patient is currently in the catastrophic phase of his insurance.    Plan: I will route my note to Dr. [name] regarding prior authorization for U-500 if he thinks this is clinically appropriate.  I will update patient with Dr. [name] decision.    Ralene Bathe, PharmD, [place] [phone]

## 2017-08-06 LAB — PTH, INTACT AND CALCIUM
CALCIUM: 9 mg/dL (ref 8.6–10.3)
PTH: 59 pg/mL (ref 14–64)

## 2017-08-07 DIAGNOSIS — R69 Illness, unspecified: Secondary | ICD-10-CM | POA: Diagnosis not present

## 2017-08-12 ENCOUNTER — Other Ambulatory Visit: Payer: Self-pay | Admitting: Pharmacist

## 2017-08-12 ENCOUNTER — Encounter: Payer: Medicare HMO | Admitting: Internal Medicine

## 2017-08-12 ENCOUNTER — Other Ambulatory Visit: Payer: Self-pay | Admitting: Endocrinology

## 2017-08-12 NOTE — Patient Outreach (Signed)
[facility]) Care Management  08/12/2017  AUDI WETTSTEIN 1941/01/26 [id number]   76 year old male referred to [place] [facility] by inpatient pharmacist Legrand Como for medication assistance with insulin. Past medical history includes, but not limited to, pancreatitis, type 2 diabetes, chronic kidney disease, CAD, atrial fibrillation, diastolic heart failure (PQ=33-00% 8/'18), COPD, depression, sick sinus syndrome, HLD,HTN, history of blood clots, hypothyroidism, and history of bladder and right ureteral cancer.   Patient currently taking 910 units of insulin daily and may benefit from concentrated U-500 insulin. He is familiar with U-500 as he was previously on this formulation. Per [employer], U-500 is covered if a prior authorization submitted. I routed my note regarding possible transition to U-500 if PA approved to Dr. [name] on 08/04/2017.   Successful outreach call to Mr. Seibert today to follow-up on his insulin. HIPAA identifiers verified.    Patient reports he has not been contacted by Dr. [name] office yet regarding possible transition to U-500 insulin.   We can learn what Mr. Marone' co-pay will be for U-500 insulin on medicare.gov however we will need Mr. Andujo' Medicare A/B card information.  Mr. Trueba is agreeable to look for his card.  He will call me back once he finds his card.    Plan: I will follow up with Mr. Rohrbach via telephone in 1 week if I have not heard back from his yet  Ralene Bathe, PharmD, [place] [phone]  Addendum: I received an incoming call from Mr. Schellinger this afternoon stating he has found his Medicare A/B card.   Information per medicare.gov website: Patient's co-pay in the catastrophic phase for Humulin R U-500 insulin  (#3 vials) for a 1 month supply: -Mail order: $562.56 -Local preferred pharmacy: $227.53  Patient currently pays $196 / month for insulin.    Patient reports he wants to continue using current insulin as it is cheaper for him than switching to U-500.    Patient denies any further medication questions or concerns.    Plan: [place] will close patient's case at this time.  I am happy to assist in the future as warranted.   I will send Dr. [name] a case-closure letter and alert [facility] CMA of case closure.   Ralene Bathe, PharmD, [place] [phone]

## 2017-08-26 DIAGNOSIS — H40052 Ocular hypertension, left eye: Secondary | ICD-10-CM | POA: Diagnosis not present

## 2017-08-26 DIAGNOSIS — H401133 Primary open-angle glaucoma, bilateral, severe stage: Secondary | ICD-10-CM | POA: Diagnosis not present

## 2017-09-01 ENCOUNTER — Other Ambulatory Visit: Payer: Self-pay | Admitting: Endocrinology

## 2017-09-09 ENCOUNTER — Encounter: Payer: Self-pay | Admitting: Endocrinology

## 2017-09-09 ENCOUNTER — Ambulatory Visit (INDEPENDENT_AMBULATORY_CARE_PROVIDER_SITE_OTHER): Payer: Medicare HMO | Admitting: Endocrinology

## 2017-09-09 DIAGNOSIS — N183 Chronic kidney disease, stage 3 (moderate): Secondary | ICD-10-CM | POA: Diagnosis not present

## 2017-09-09 DIAGNOSIS — D500 Iron deficiency anemia secondary to blood loss (chronic): Secondary | ICD-10-CM

## 2017-09-09 DIAGNOSIS — E7800 Pure hypercholesterolemia, unspecified: Secondary | ICD-10-CM | POA: Diagnosis not present

## 2017-09-09 DIAGNOSIS — Z794 Long term (current) use of insulin: Secondary | ICD-10-CM | POA: Diagnosis not present

## 2017-09-09 DIAGNOSIS — R0600 Dyspnea, unspecified: Secondary | ICD-10-CM

## 2017-09-09 DIAGNOSIS — E1122 Type 2 diabetes mellitus with diabetic chronic kidney disease: Secondary | ICD-10-CM | POA: Diagnosis not present

## 2017-09-09 MED ORDER — INSULIN REGULAR HUMAN 100 UNIT/ML IJ SOLN: INTRAMUSCULAR | 3 refills | Status: DC

## 2017-09-09 NOTE — Progress Notes (Signed)
Subjective:    Patient ID: [name], male    DOB: 08-Sep-1941, 76 y.o.   MRN: [id number]  HPI Pt returns for f/u of diabetes mellitus:  DM type: Insulin-requiring type 2.  Dx'ed: 1988.  Complications: renal insufficiency, polyneuropathy, and CAD.   Therapy: insulin since 1989.  DKA: never.   Severe hypoglycemia: never.   Pancreatitis: several episodes, most recently in 2013.  Other: He brings a record of his cbg's which I have reviewed today.  It varies from 46-360.  It is higest fasting.  Past Medical History:  Diagnosis Date  . Anemia    hx of  . Anxiety   . CAD (coronary artery disease)   . Cancer [facility]) 2014   bladder cancer AND RIGHT URETERAL CANCER  . COPD (chronic obstructive pulmonary disease) (HCC)    CPAP  . Depression   . Diabetes mellitus    TypeII  . Diastolic dysfunction with chronic heart failure ([place])   . Gait difficulty    slow gait"ambulates with walker"  . GERD (gastroesophageal reflux disease)   . 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 H/O Legionnaire's disease 2003  . History of blood clots    R groin  . History of kidney stones   . Hyperkalemia   . Hyperlipemia   . Hypertensive cardiovascular-renal disease   . Hypothyroidism   . Morbid obesity ([place])   . OSA on CPAP    using CPAP although it is hard for him to tolerate  . Osteoarthritis    fingers  . Pancreatitis   . Peripheral neuropathy   . Persistent atrial fibrillation ([place])   . Pneumonia 2003  . Renal insufficiency   . Shortness of breath dyspnea    with exertion  . Sick sinus syndrome ([place]) 06/2010   s/p PPM by [employer]  . Venous insufficiency     Past Surgical History:  Procedure Laterality Date  . BELPHAROPTOSIS REPAIR Right    Glaucoma  . CARDIAC CATHETERIZATION     11-02-13  . CARDIOVERSION N/A 05/16/2014   Procedure: CARDIOVERSION;  Surgeon: Josue Hector, MD;  Location[facility] ENDOSCOPY;  Service: Cardiovascular;  Laterality: N/A;  . CARDIOVERSION N/A  11/08/2014   Procedure: CARDIOVERSION;  Surgeon: Fay Records, MD;  Location: [place];  Service: Cardiovascular;  Laterality: N/A;  . CARDIOVERSION N/A 06/22/2015   Procedure: CARDIOVERSION;  Surgeon: Jerline Pain, MD;  Location: [place];  Service: Cardiovascular;  Laterality: N/A;  . CHOLECYSTECTOMY  2012  . CYSTOSCOPY W/ URETERAL STENT PLACEMENT Right 10/26/2013   Procedure: CYSTOSCOPY WITH RETROGRADE PYELOGRAM/URETERAL STENT PLACEMENT;  Surgeon: Alexis Frock, MD;  Location: [facility];  Service: Urology;  Laterality: Right;  . CYSTOSCOPY WITH URETEROSCOPY AND STENT PLACEMENT Right 01/11/2014   Procedure: CYSTOSCOPY WITH URETEROSCOPY ,RIGHT RETROGRADE AND STENT CHANGE AND LASER OF URETERAL TUMOR;  Surgeon: Alexis Frock, MD;  Location: [facility];  Service: Urology;  Laterality: Right;  . CYSTOSCOPY/RETROGRADE/URETEROSCOPY Right 08/23/2014   Procedure: CYSTOSCOPY/RETROGRADE/ DIAGNOSTIC URETEROSCOPY/RIGHT RENAL STONE EXTRACTION;  Surgeon: Alexis Frock, MD;  Location: [facility];  Service: Urology;  Laterality: Right;  . EMBOLECTOMY Left 11/02/2013   Procedure: LEFT FEMORAL EMBOLECTOMY, LEFT FEMORAL ARTERY ENDARTERECTOMY WITH DACRON PATCH ANGIOPLASTY.;  Surgeon: Mal Misty, MD;  Location: [place];  Service: Vascular;  Laterality: Left;  . EYE SURGERY Left    cataract  . GROIN DEBRIDEMENT Right 05/08/2015   Procedure: REMOVAL OF RIGHT GROIN MASS;  Surgeon: Harrell Gave  Nicole Cella, MD;  Location: [place];  Service: Vascular;  Laterality: Right;  . HOLMIUM LASER APPLICATION Right 05/14/3418   Procedure: HOLMIUM LASER APPLICATION;  Surgeon: Alexis Frock, MD;  Location: [facility];  Service: Urology;  Laterality: Right;  . INSERT / REPLACE / REMOVE PACEMAKER  2011  . NASAL SEPTUM SURGERY  1967  . PACEMAKER PLACEMENT  06/2010   [facility] Accent RF DR, Model [id number] ( Serial number [id number])  . TEE WITHOUT CARDIOVERSION N/A 05/16/2014   Procedure: TRANSESOPHAGEAL ECHOCARDIOGRAM (TEE);  Surgeon: Josue Hector, MD;  Location: [facility] ENDOSCOPY;  Service: Cardiovascular;  Laterality: N/A;  . thromboembolectomy and four compartment fasciotomy Right 2009   leg  . TRANSURETHRAL RESECTION OF BLADDER TUMOR WITH GYRUS (TURBT-GYRUS) N/A 10/26/2013   Procedure: TRANSURETHRAL RESECTION OF BLADDER TUMOR WITH GYRUS (TURBT-GYRUS);  Surgeon: Alexis Frock, MD;  Location: [facility];  Service: Urology;  Laterality: N/A;  . TRANSURETHRAL RESECTION OF BLADDER TUMOR WITH GYRUS (TURBT-GYRUS) N/A 01/11/2014   Procedure: TRANSURETHRAL RESECTION OF BLADDER TUMOR WITH GYRUS (TURBT-GYRUS);  Surgeon: Alexis Frock, MD;  Location: [facility];  Service: Urology;  Laterality: N/A;  . WISDOM TOOTH EXTRACTION      Social History   Social History  . Marital status: Married    Spouse name: N/A  . Number of children: N/A  . Years of education: N/A   Occupational History  . 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 Smoking status: Former Smoker    Packs/day: 2.00    Years: 50.00    Types: Cigarettes    Quit date: 12/08/2006  . Smokeless tobacco: Never Used  . Alcohol use No     Comment: quit 3 years ago  . Drug use: No  . Sexual activity: Not Currently   Other Topics Concern  . Not on file   Social History Narrative  . No narrative on file    Current Outpatient Prescriptions on File Prior to Visit  Medication Sig Dispense Refill  . acetaminophen (TYLENOL) 500 MG tablet Take 500-1,000 mg by mouth 2 (two) times daily as needed for moderate pain.     Marland Kitchen albuterol (PROVENTIL) (2.5 MG/3ML) 0.083% nebulizer solution Take 3 mLs (2.5 mg total) by nebulization every 4 (four) hours as needed for wheezing or shortness of breath. (Patient not taking: Reported on 08/04/2017) 75 mL 1  . albuterol (VENTOLIN HFA) 108 (90 Base) MCG/ACT inhaler Inhale 2 puffs into the lungs every 6 (six) hours as needed for wheezing or shortness of breath. 18 Inhaler 1  . ALPRAZolam (XANAX) 0.25 MG tablet TAKE 1 TABLET BY MOUTH  3 TIMES A DAY AS NEEDED FOR ANXIETY OR SLEEP 50 tablet 0  . amiodarone (PACERONE) 200 MG tablet Take 1 tablet (200 mg total) by mouth daily. 90 tablet 3  . apixaban (ELIQUIS) 5 MG TABS tablet Take 1 tablet (5 mg total) by mouth 2 (two) times daily. 180 tablet 3  . Ascorbic Acid (VITAMIN C) 500 MG tablet Take 500 mg by mouth daily.     . brimonidine (ALPHAGAN P) 0.1 % SOLN Place 1 drop into the left eye 3 (three) times daily.     . calcium carbonate (TUMS - DOSED IN MG ELEMENTAL CALCIUM) 500 MG chewable tablet Chew 2 tablets by mouth 2 (two) times daily as needed for indigestion or heartburn.    . cholecalciferol (VITAMIN D) 1000 UNITS tablet Take 1,000 Units by mouth 2 (two) times daily.     Marland Kitchen  dextromethorphan (DELSYM) 30 MG/5ML liquid Take 90 mg by mouth 2 (two) times daily as needed for cough.    . docusate sodium (COLACE) 100 MG capsule Take 100 mg by mouth daily as needed for mild constipation.    . dorzolamide (TRUSOPT) 2 % ophthalmic solution Place 1 drop into both eyes 2 (two) times daily.    Marland Kitchen doxycycline (VIBRAMYCIN) 100 MG capsule Take 1 capsule (100 mg total) by mouth 2 (two) times daily. 6 capsule 0  . fluticasone (FLONASE) 50 MCG/ACT nasal spray USE 2 SPRAYS IN EACN NOSTRIL DAILY 16 g 3  . furosemide (LASIX) 40 MG tablet Take 1 tablet (40 mg total) by mouth daily. TAKE 1 TABLET BY MOUTH IN THE MORNING AND 1/2 TABLET IN THE EVENING (Patient taking differently: Take 40 mg by mouth See admin instructions. Takes 40 mg in the morning and 20 mg in the evening) 135 tablet 3  . guaifenesin (HUMIBID E) 400 MG TABS tablet Take 400 mg by mouth 2 (two) times daily as needed (for congestion).     Marland Kitchen ipratropium-albuterol (DUONEB) 0.5-2.5 (3) MG/3ML SOLN Take 3 mLs by nebulization every 4 (four) hours as needed. (Patient not taking: Reported on 08/04/2017) 360 mL 1  . Lactobacillus (ACIDOPHILUS) CAPS capsule Take 1 capsule by mouth daily.    Marland Kitchen levothyroxine (SYNTHROID, LEVOTHROID) 75 MCG tablet Take 1  tablet (75 mcg total) by mouth daily before breakfast. 90 tablet 3  . metoprolol tartrate (LOPRESSOR) 25 MG tablet Take 1 tablet (25 mg total) by mouth 3 (three) times daily. 270 tablet 3  . Multiple Vitamin (MULTIVITAMIN WITH MINERALS) TABS tablet Take 1 tablet by mouth daily.    [place]el Dimesylate (RHOPRESSA) 0.02 % SOLN Place 1 drop into the left eye daily.    . Omega-3 Fatty Acids (FISH OIL) 1000 MG CAPS Take 1,000 mg by mouth every morning.     Marland Kitchen omeprazole (PRILOSEC) 40 MG capsule TAKE 1 CAPSULE DAILY 90 capsule 1  . predniSONE (DELTASONE) 20 MG tablet Take 3 tabs x 3 days, then 2 tabs x 3 days, then 1 tab x 3 days. 18 tablet 0  . rosuvastatin (CRESTOR) 40 MG tablet Take 20 mg by mouth every evening.    . Timolol Maleate 0.5 % (DAILY) SOLN Place 1 drop into both eyes 2 (two) times daily.    . traMADol (ULTRAM) 50 MG tablet TAKE 2 TABLETS BY MOUTH TWICE A DAY AS NEEDED FOR PAIN 60 tablet 2  . travoprost, benzalkonium, (TRAVATAN) 0.004 % ophthalmic solution Place 1 drop into the left eye at bedtime.     No current facility-administered medications on file prior to visit.     Allergies  Allergen Reactions  . Actos [Pioglitazone] Swelling  . Timolol Other (See Comments)    Slow heart rate but tolerates Cosopt (dorzolamide-timolol)    Family History  Problem Relation Age of Onset  . Liver cancer Mother        deceased age 56  . Cancer Mother        liver cancer  . Heart attack Father   . Colon cancer Neg Hx     BP 110/82   Pulse 78   Wt (!) 316 lb 3.2 oz (143.4 kg)   SpO2 92%   BMI 45.37 kg/m    Review of Systems Denies LOC    Objective:   Physical Exam VITAL SIGNS:  See vs page.  GENERAL: no distress.  LUNGS:  Clear to auscultation. Pulses: dorsalis pedis  absent bilat (prob due to edema).  MSK: no deformity of the feet or ankles.   CV: 1+ bilat edema of the legs.   Skin:  no ulcer on the feet or ankles.  normal color and temp on the feet and ankles. Several  healed surgical scars at the right leg. Neuro: sensation is intact to touch on the feet and ankles.   Ext: There is bilateral onychomycosis of the toenails. Right great toenail is falling off.    Lab Results  Component Value Date   HGBA1C 7.8 08/03/2017      Assessment & Plan:  Insulin-requiring type 2 DM, with CAD: this is the best control this pt should aim for, given variable cbg's.    Patient Instructions  Please change the insulin to the lumbers listed below. check your blood sugar 4 times a day: before the 3 meals, and at bedtime.  also check if you have symptoms of your blood sugar being too high or too low.  please keep a record of the readings and bring it to your next appointment here (or you can bring the meter itself).  You can write it on any piece of paper.  please call us sooner if your blood sugar goes below 70, or if you have a lot of readings over 200.  Please come back for a regular physical appointment in 2 months.

## 2017-09-09 NOTE — Patient Instructions (Addendum)
Please change the insulin to the lumbers listed below. check your blood sugar 4 times a day: before the 3 meals, and at bedtime.  also check if you have symptoms of your blood sugar being too high or too low.  please keep a record of the readings and bring it to your next appointment here (or you can bring the meter itself).  You can write it on any piece of paper.  please call us sooner if your blood sugar goes below 70, or if you have a lot of readings over 200.  Please come back for a regular physical appointment in 2 months.

## 2017-09-18 ENCOUNTER — Encounter: Payer: Self-pay | Admitting: *Deleted

## 2017-10-07 ENCOUNTER — Ambulatory Visit (INDEPENDENT_AMBULATORY_CARE_PROVIDER_SITE_OTHER): Payer: Medicare HMO | Admitting: Pulmonary Disease

## 2017-10-07 ENCOUNTER — Encounter: Payer: Self-pay | Admitting: Pulmonary Disease

## 2017-10-07 ENCOUNTER — Other Ambulatory Visit: Payer: Self-pay | Admitting: Endocrinology

## 2017-10-07 VITALS — BP 118/76 | HR 74 | Ht 70.0 in | Wt 323.0 lb

## 2017-10-07 DIAGNOSIS — G4733 Obstructive sleep apnea (adult) (pediatric): Secondary | ICD-10-CM | POA: Diagnosis not present

## 2017-10-07 DIAGNOSIS — I5032 Chronic diastolic (congestive) heart failure: Secondary | ICD-10-CM

## 2017-10-07 DIAGNOSIS — R0602 Shortness of breath: Secondary | ICD-10-CM | POA: Diagnosis not present

## 2017-10-07 MED ORDER — FUROSEMIDE 40 MG PO TABS: 40.0000 mg | ORAL_TABLET | Freq: Two times a day (BID) | ORAL | 3 refills | Status: DC

## 2017-10-07 NOTE — Assessment & Plan Note (Signed)
Increase lasix to 40 mg twice daily x 1 week, then back down to usual dose - take evening dose by 4 pm

## 2017-10-07 NOTE — Assessment & Plan Note (Signed)
Send Rx for new CPAP 10 cm, DL in 4 wks finetune  settings based on download  Apparently his CPAP  pressure is about 8 cm  Weight loss encouraged, compliance with goal of at least 4-6 hrs every night is the expectation. Advised against medications with sedative side effects Cautioned against driving when sleepy - understanding that sleepiness will vary on a day to day basis

## 2017-10-07 NOTE — Patient Instructions (Signed)
Increase lasix to 40 mg twice daily x 1 week, then back down to usual dose - take evening dose by 4 pm   Send Rx for new CPAP 10 cm, DL in 4 wks

## 2017-10-07 NOTE — Progress Notes (Signed)
Subjective:    Patient ID: [name], male    DOB: 1941-04-05, 76 y.o.   MRN: [id number]  HPI  76 yo male with severe OSA on CPAP  He was last seen in 2016 and presents to reestablish care.    He also wants to be evaluated for cough and congestion which has he reports that for some reason he is having difficulty using his CPAP machine lately.  Persisted since his hospital admission 07/2017.  Review of epic records show that he was admitted for acute respiratory failure/COPD exacerbation and congestive heart failure and treated with oxygen transiently.  This was discontinued by his PCP on follow-up visit.  Echo showed drop in EF to 45% and he was referred to cardiology for new LV dysfunction but he has not been seen by them yet. Chest x-ray reviewed by me shows cardiomegaly and mild interstitial prominence  He continues to have a cough productive of minimal amounts of white sputum.  He has used Ventolin HFA without significant benefit he denies wheezing.  He is compliant with his Lasix but continues to have increasing bipedal edema but denies orthopnea or paroxysmal nocturnal dyspnea.  He smoked more than 100 pack years before he quit in 2008  Reports sleepiness score is 6. Bedtime is between 9 and 10 PM, sleep latency is about 30 minutes, reports 3-4 nocturnal awakenings including nocturia before he is out of bed at 9 AM feeling tired with dryness of mouth and occasional headache.  He has gained about 9 pounds in the last 2 years     Significant tests/ events reviewed  NPSG 2003:  AHI 81/hr.  PFTs 05/2008 ratio 66, FEV1 110%, diffusion 72% and corrects to 99% from with a volume  Spirometry 09/2017 shows ratio 71, FEV1 60% and FVC 61%   Past Medical History:  Diagnosis Date  . Anemia    hx of  . Anxiety   . CAD (coronary artery disease)   . Cancer [facility]) 2014   bladder cancer AND RIGHT URETERAL CANCER  . COPD (chronic obstructive pulmonary disease) (HCC)    CPAP  .  Depression   . Diabetes mellitus    TypeII  . Diastolic dysfunction with chronic heart failure ([place])   . Gait difficulty    slow gait"ambulates with walker"  . GERD (gastroesophageal reflux disease)   . 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 H/O Legionnaire's disease 2003  . History of blood clots    R groin  . History of kidney stones   . Hyperkalemia   . Hyperlipemia   . Hypertensive cardiovascular-renal disease   . Hypothyroidism   . Morbid obesity ([place])   . OSA on CPAP    using CPAP although it is hard for him to tolerate  . Osteoarthritis    fingers  . Pancreatitis   . Peripheral neuropathy   . Persistent atrial fibrillation ([place])   . Pneumonia 2003  . Renal insufficiency   . Shortness of breath dyspnea    with exertion  . Sick sinus syndrome ([place]) 06/2010   s/p PPM by [employer]  . Venous insufficiency      Review of Systems  Positive for shortness of breath with activity, cough with white sputum, irregular heartbeat, acid heartburn, indigestion, anxiety depression, pedal edema  Constitutional: negative for anorexia, fevers and sweats  Eyes: negative for irritation, redness and visual disturbance  Ears, nose, mouth, throat, and face: negative for  earaches, epistaxis, nasal congestion and sore throat  Respiratory: negative for cough, dyspnea on exertion, sputum and wheezing  Cardiovascular: negative for chest pain,  orthopnea, palpitations and syncope  Gastrointestinal: negative for abdominal pain, constipation, diarrhea, melena, nausea and vomiting  Genitourinary:negative for dysuria, frequency and hematuria  Hematologic/lymphatic: negative for bleeding, easy bruising and lymphadenopathy  Musculoskeletal:negative for arthralgias, muscle weakness and stiff joints  Neurological: negative for coordination problems, gait problems, headaches and weakness  Endocrine: negative for diabetic symptoms including polydipsia, polyuria and weight loss       Objective:   Physical Exam   Gen. Pleasant, obese, in no distress, normal affect ENT -  no post nasal drip, class 2-3 airway, hard of hearing Neck: No JVD, no thyromegaly, no carotid bruits Lungs: no use of accessory muscles, no dullness to percussion, decreased without rales or rhonchi  Cardiovascular: Rhythm regular, heart sounds  normal, no murmurs or gallops, 2+  peripheral edema Abdomen: soft and non-tender, no hepatosplenomegaly, BS normal. Musculoskeletal: No deformities, no cyanosis or clubbing Neuro:  alert, non focal, no tremors        Assessment & Plan:

## 2017-10-12 ENCOUNTER — Other Ambulatory Visit: Payer: Self-pay | Admitting: Endocrinology

## 2017-10-26 ENCOUNTER — Ambulatory Visit: Payer: Medicare HMO | Admitting: Nurse Practitioner

## 2017-10-26 ENCOUNTER — Encounter: Payer: Self-pay | Admitting: Nurse Practitioner

## 2017-10-26 VITALS — BP 110/72 | HR 73 | Ht 70.0 in | Wt 318.0 lb

## 2017-10-26 DIAGNOSIS — I480 Paroxysmal atrial fibrillation: Secondary | ICD-10-CM

## 2017-10-26 DIAGNOSIS — I5042 Chronic combined systolic (congestive) and diastolic (congestive) heart failure: Secondary | ICD-10-CM | POA: Diagnosis not present

## 2017-10-26 MED ORDER — FUROSEMIDE 40 MG PO TABS: ORAL_TABLET | ORAL | 3 refills | Status: DC

## 2017-10-26 NOTE — Patient Instructions (Addendum)
We will be checking the following labs today - BMET, HPF, TSH, CBC and BNP   Medication Instructions:    Continue with your current medicines but  I am increasing the morning dose of Lasix to 60 mg and afternoon dose 20 mg - total of 80 mg each day - I have sent this to your mail order.     Testing/Procedures To Be Arranged:  N/A  Follow-Up:   See me in about 6 weeks with plans to recheck labs. Truitt Merle, NP 12/07/17 @ 3 PM     Other Special Instructions:   N/A    If you need a refill on your cardiac medications before your next appointment, please call your pharmacy.   Call the [place] office at [phone] if you have any questions, problems or concerns.

## 2017-10-26 NOTE — Progress Notes (Signed)
CARDIOLOGY OFFICE NOTE  Date:  10/26/2017    [name] Date of Birth: 1941-10-18 Medical Record #[id number]  PCP:  Renato Shin, MD  Cardiologist:  [facility]    Chief Complaint  Patient presents with  . Atrial Fibrillation  . Congestive Heart Failure  . Coronary Artery Disease    Seen for Dr. Rayann Heman.     History of Present Illness: [name] is a 76 y.o. male who presents today for a 6 month check. Seen for Dr. Rayann Heman.   He has a history of diastolic HF, morbid obesity, PAF with past cardioversion, venous insufficiency, CKD, anemia, hypothyroidism, DM, COPD, OSA on CPAP, HLD, SSS with PPM in place, CAD (no specifics noted), HTN and depression. On chronic anticoagulation with Eliquis.   He has had several cardioversions - last in July of 2016. He is now on amiodarone. Dr. Rayann Heman and I both have felt like he is not really any more symptomatic from his baseline regardless of his rhythm but Yonael has preferred to be in NSR.  Our plan has been that If he remains in sinus, will continue on amiodarone 200mg  daily. IF he does not convert or does not have significant clinical improve with cardioversion, it may be better to stop amiodarone and use a rate control strategy long term.  I last saw him almost a year ago - it was still pretty hard to say if how he feels is in any way related to what his rhythm is - but he was in NSR.   Saw Dr. Rayann Heman back in May - felt to be stable.   Admitted back in August with respiratory failure/COPD exacerbation. Treated with antibiotics, prednisone, nebs and oxygen. Echo updated - EF down now to 45 to 50% - was 55% in 2015. Appeared to be euvolemic. Troponins were flat/mildly elevated - felt to be due to respiratory failure in the setting of CKD.   Comes back today. Here alone. Short of breath with coming into the office. Using his walker. Was seen at pulmonary at the end of October - Lasix increased for a week - he says this  really did not help - he is back to total dose of 60 mg a day. He remains short of breath. Coughing "all the time". Some sputum occasionally - not on ACE/ARB. No chest pain. Always with swelling in legs/feet - he does not feel like it is any worse. He is wearing his CPAP.   Past Medical History:  Diagnosis Date  . Anemia    hx of  . Anxiety   . CAD (coronary artery disease)   . Cancer [facility]) 2014   bladder cancer AND RIGHT URETERAL CANCER  . COPD (chronic obstructive pulmonary disease) (HCC)    CPAP  . Depression   . Diabetes mellitus    TypeII  . Diastolic dysfunction with chronic heart failure ([place])   . Gait difficulty    slow gait"ambulates with walker"  . GERD (gastroesophageal reflux disease)   . 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 H/O Legionnaire's disease 2003  . History of blood clots    R groin  . History of kidney stones   . Hyperkalemia   . Hyperlipemia   . Hypertensive cardiovascular-renal disease   . Hypothyroidism   . Morbid obesity ([place])   . OSA on CPAP    using CPAP although it is hard for him to tolerate  .  Osteoarthritis    fingers  . Pancreatitis   . Peripheral neuropathy   . Persistent atrial fibrillation ([place])   . Pneumonia 2003  . Renal insufficiency   . Shortness of breath dyspnea    with exertion  . Sick sinus syndrome ([place]) 06/2010   s/p PPM by [employer]  . Venous insufficiency     Past Surgical History:  Procedure Laterality Date  . ABDOMINAL ANGIOGRAM  11/02/2013   Performed by Serafina Mitchell, MD at [facility] CATH LAB  . ANGIOGRAM EXTREMITY LEFT Left 11/02/2013   Performed by Serafina Mitchell, MD at [facility] CATH LAB  . BELPHAROPTOSIS REPAIR Right    Glaucoma  . CARDIAC CATHETERIZATION     11-02-13  . CARDIOVERSION N/A 06/22/2015   Performed by Jerline Pain, MD at [facility] ENDOSCOPY  . CARDIOVERSION N/A 11/08/2014   Performed by Fay Records, MD at [facility] ENDOSCOPY  . CARDIOVERSION N/A 05/16/2014   Performed by Josue Hector, MD at [facility]  ENDOSCOPY  . CHOLECYSTECTOMY  2012  . CYSTOSCOPY WITH RETROGRADE PYELOGRAM/URETERAL STENT PLACEMENT Right 10/26/2013   Performed by Phebe Colla, MD at [facility] ORS  . CYSTOSCOPY WITH URETEROSCOPY ,RIGHT RETROGRADE AND STENT CHANGE AND LASER OF URETERAL TUMOR Right 01/11/2014   Performed by Phebe Colla, MD at [facility] ORS  . CYSTOSCOPY/RETROGRADE/ DIAGNOSTIC URETEROSCOPY/RIGHT RENAL STONE EXTRACTION Right 08/23/2014   Performed by Phebe Colla, MD at [facility] ORS  . EYE SURGERY Left    cataract  . HOLMIUM LASER APPLICATION Right 12/15/15   Performed by Phebe Colla, MD at [facility] ORS  . INSERT / REPLACE / REMOVE PACEMAKER  2011  . LEFT FEMORAL EMBOLECTOMY, LEFT FEMORAL ARTERY ENDARTERECTOMY WITH DACRON PATCH ANGIOPLASTY. Left 11/02/2013   Performed by Mal Misty, MD at [facility] OR  . NASAL SEPTUM SURGERY  1967  . PACEMAKER PLACEMENT  06/2010   [facility] Accent RF DR, Model [id number] ( Serial number [id number])  . REMOVAL OF RIGHT GROIN MASS Right 05/08/2015   Performed by Angelia Mould, MD at [place]  . thromboembolectomy and four compartment fasciotomy Right 2009   leg  . TRANSESOPHAGEAL ECHOCARDIOGRAM (TEE) N/A 05/16/2014   Performed by Josue Hector, MD at [place]  . TRANSURETHRAL RESECTION OF BLADDER TUMOR WITH GYRUS (TURBT-GYRUS) N/A 01/11/2014   Performed by Phebe Colla, MD at [facility] ORS  . TRANSURETHRAL RESECTION OF BLADDER TUMOR WITH GYRUS (TURBT-GYRUS) N/A 10/26/2013   Performed by Phebe Colla, MD at [facility] ORS  . WISDOM TOOTH EXTRACTION       Medications: Current Meds  Medication Sig  . acetaminophen (TYLENOL) 500 MG tablet Take 500-1,000 mg by mouth 2 (two) times daily as needed for moderate pain.   Marland Kitchen albuterol (PROVENTIL) (2.5 MG/3ML) 0.083% nebulizer solution Take 3 mLs (2.5 mg total) by nebulization every 4 (four) hours as needed for wheezing or shortness of breath.  Marland Kitchen albuterol (VENTOLIN HFA) 108 (90 Base) MCG/ACT inhaler Inhale 2 puffs into the lungs every 6 (six) hours as needed for wheezing or  shortness of breath.  . ALPRAZolam (XANAX) 0.25 MG tablet TAKE 1 TABLET BY MOUTH 3 TIMES A DAY AS NEEDED FOR ANXIETY OR SLEEP  . amiodarone (PACERONE) 200 MG tablet Take 1 tablet (200 mg total) by mouth daily.  Marland Kitchen apixaban (ELIQUIS) 5 MG TABS tablet Take 1 tablet (5 mg total) by mouth 2 (two) times daily.  . Ascorbic Acid (VITAMIN C) 500 MG tablet Take 500 mg by mouth daily.   Marland Kitchen  brimonidine (ALPHAGAN P) 0.1 % SOLN Place 1 drop into the left eye 3 (three) times daily.   . calcium carbonate (TUMS - DOSED IN MG ELEMENTAL CALCIUM) 500 MG chewable tablet Chew 2 tablets by mouth 2 (two) times daily as needed for indigestion or heartburn.  . cholecalciferol (VITAMIN D) 1000 UNITS tablet Take 1,000 Units by mouth 2 (two) times daily.   Marland Kitchen dextromethorphan (DELSYM) 30 MG/5ML liquid Take 90 mg by mouth 2 (two) times daily as needed for cough.  . docusate sodium (COLACE) 100 MG capsule Take 100 mg by mouth daily as needed for mild constipation.  . dorzolamide (TRUSOPT) 2 % ophthalmic solution Place 1 drop into both eyes 2 (two) times daily.  . fluticasone (FLONASE) 50 MCG/ACT nasal spray USE 2 SPRAYS IN EACN NOSTRIL DAILY  . furosemide (LASIX) 40 MG tablet Take 60 mg in the AM and 20 mg in the early afternoon - total dose of 80 mg a day  . guaifenesin (HUMIBID E) 400 MG TABS tablet Take 400 mg by mouth 2 (two) times daily as needed (for congestion).   . insulin regular (NOVOLIN R) 100 units/mL injection INJECT 4 TIMES A DAY (JUST BEFORE EACH MEAL)          300 units with breakfast, 220 units with lunch, 220 units  at supper, and 170 units with bedtime snack.  Marland Kitchen ipratropium-albuterol (DUONEB) 0.5-2.5 (3) MG/3ML SOLN Take 3 mLs by nebulization every 4 (four) hours as needed.  . Lactobacillus (ACIDOPHILUS) CAPS capsule Take 1 capsule by mouth daily.  Marland Kitchen levothyroxine (SYNTHROID, LEVOTHROID) 75 MCG tablet Take 1 tablet (75 mcg total) by mouth daily before breakfast.  . metoprolol tartrate (LOPRESSOR) 25 MG tablet  Take 1 tablet (25 mg total) by mouth 3 (three) times daily.  . Multiple Vitamin (MULTIVITAMIN WITH MINERALS) TABS tablet Take 1 tablet by mouth daily.  Mckinley Jewel Dimesylate (RHOPRESSA) 0.02 % SOLN Place 1 drop into the left eye daily.  . Omega-3 Fatty Acids (FISH OIL) 1000 MG CAPS Take 1,000 mg by mouth every morning.   Marland Kitchen omeprazole (PRILOSEC) 40 MG capsule TAKE 1 CAPSULE DAILY  . rosuvastatin (CRESTOR) 40 MG tablet Take 20 mg by mouth every evening.  . Timolol Maleate 0.5 % (DAILY) SOLN Place 1 drop into both eyes 2 (two) times daily.  . traMADol (ULTRAM) 50 MG tablet TAKE 2 TABLETS BY MOUTH TWICE A DAY AS NEEDED FOR PAIN  . travoprost, benzalkonium, (TRAVATAN) 0.004 % ophthalmic solution Place 1 drop into the left eye at bedtime.  . [DISCONTINUED] furosemide (LASIX) 40 MG tablet Take 1 tablet (40 mg total) by mouth 2 (two) times daily.     Allergies: Allergies  Allergen Reactions  . Actos [Pioglitazone] Swelling  . Timolol Other (See Comments)    Slow heart rate but tolerates Cosopt (dorzolamide-timolol)    Social History: The patient  reports that he quit smoking about 10 years ago. His smoking use included cigarettes. He has a 100.00 pack-year smoking history. he has never used smokeless tobacco. He reports that he does not drink alcohol or use drugs.   Family History: The patient's family history includes Cancer in his mother; Heart attack in his father; Liver cancer in his mother.   Review of Systems: Please see the history of present illness.   Otherwise, the review of systems is positive for none.   All other systems are reviewed and negative.   Physical Exam: VS:  BP 110/72   Pulse 73  Ht 5\' 10"  (1.778 m)   Wt (!) 318 lb (144.2 kg)   BMI 45.63 kg/m  .  BMI Body mass index is 45.63 kg/m.  Wt Readings from Last 3 Encounters:  10/26/17 (!) 318 lb (144.2 kg)  10/07/17 (!) 323 lb (146.5 kg)  09/09/17 (!) 316 lb 3.2 oz (143.4 kg)    General: Pleasant. Elderly obese  male - looks chronically ill but he is alert and in no acute distress.  He is short of breath with exertion.  HEENT: Normal.  Neck: Supple, no JVD, carotid bruits, or masses noted.  Cardiac: Regular rate and rhythm. Heart tones are distant but there is an outflow murmur. Legs are just massively full with edema.  Respiratory:  Lungs are clear to auscultation bilaterally with normal work of breathing.  GI: Massively obese.  MS: No deformity or atrophy. Gait and ROM intact. Using a walker.  Skin: Warm and dry. Color is normal.  Neuro:  Strength and sensation are intact and no gross focal deficits noted.  Psych: Alert, appropriate and with normal affect.   LABORATORY DATA:  EKG:  EKG is not ordered today. This demonstrates AV pacing.   Lab Results  Component Value Date   WBC 14.5 (H) 08/03/2017   HGB 13.9 08/03/2017   HCT 42.3 08/03/2017   PLT 321.0 08/03/2017   GLUCOSE 45 (LL) 08/03/2017   CHOL 133 05/06/2017   TRIG 396.0 (H) 05/06/2017   HDL 39.70 05/06/2017   LDLDIRECT 59.0 05/06/2017   LDLCALC  05/07/2008    UNABLE TO CALCULATE IF TRIGLYCERIDE OVER 400 mg/dL        Total Cholesterol/HDL:CHD Risk Coronary Heart Disease Risk Table                     Men   Women  1/2 Average Risk   3.4   3.3   ALT 25 05/06/2017   AST 29 05/06/2017   NA 139 08/03/2017   K 4.3 08/03/2017   CL 104 08/03/2017   CREATININE 1.68 (H) 08/03/2017   BUN 47 (H) 08/03/2017   CO2 24 08/03/2017   TSH 2.21 05/06/2017   PSA 0.73 05/06/2017   INR 1.33 07/28/2017   HGBA1C 7.8 08/03/2017   MICROALBUR 170.0 Repeated and verified X2. (H) 06/27/2013     BNP (last 3 results) Recent Labs    07/28/17 1530  BNP 173.7*    ProBNP (last 3 results) No results for input(s): PROBNP in the last 8760 hours.   Other Studies Reviewed Today:  Echo Study Conclusions August 2018  - Left ventricle: Abnromal septal motion apical and inferior   hypokinesis poor image quality. The cavity size was normal. Wall    thickness was increased in a pattern of mild LVH. Systolic   function was mildly reduced. The estimated ejection fraction was   in the range of 45% to 50%. Left ventricular diastolic function   parameters were normal. - Aortic valve: There was moderate stenosis. Valve area (VTI): 0.92   cm^2. Valve area (Vmax): 0.93 cm^2. Valve area (Vmean): 0.83   cm^2. - Left atrium: The atrium was moderately dilated. - Right ventricle: Pacer wire or catheter noted in right ventricle. - Atrial septum: No defect or patent foramen ovale was identified.  Impressions:  - There are not enough images available for an interpretation. Need   to complete echo or will need to send all images to system, not   sure which is the problem.  Assessment/Plan:  1. Newly combined chronic systolic and diastolic HF - not on optimal therapy - not a lot of BP to work with. Has chronic kidney disease as well. I'd like to try and get him on Aldactone - but with his kidneys and transportation/mobility being such a big issue - he has issues just getting here - I do not think this is worthwhile. Lasix will be increased today - 60mg  in the AM and 20 mg in the afternoon - will try to leave him on this longterm. His overall prognosis is quite tenuous. Limited options.    2. PAF - looks to be in sinus.   3. Underlying PPM - followed by EP  4. High risk medicine - needs labs today.   5. OSA - followed by pulmonary. Says he is wearing his CPAP.   6. Morbid obesity - this is a big part of his issues - I do not see this changing.   7. HTN - BP ok on current regimen.   8. Moderate AS - no cardinal symptoms   Current medicines are reviewed with the patient today.  The patient does not have concerns regarding medicines other than what has been noted above.  The following changes have been made:  See above.  Labs/ tests ordered today include:    Orders Placed This Encounter  Procedures  . Basic metabolic panel  . CBC  .  Hepatic function panel  . TSH  . Pro b natriuretic peptide (BNP)  . EKG 12-Lead     Disposition:   FU with me in about 4 to 6 weeks months. See Dr. Rayann Heman in 6 months.    Patient is agreeable to this plan and will call if any problems develop in the interim.   SignedTruitt Merle, NP  10/26/2017 2:25 PM  [facility] [address] [place],   [address] Phone: [phone] Fax: [phone]

## 2017-10-27 LAB — CBC
Hematocrit: 37.9 % (ref 37.5–51.0)
Hemoglobin: 11.9 g/dL — ABNORMAL LOW (ref 13.0–17.7)
MCH: 29.1 pg (ref 26.6–33.0)
MCHC: 31.4 g/dL — ABNORMAL LOW (ref 31.5–35.7)
MCV: 93 fL (ref 79–97)
Platelets: 249 10*3/uL (ref 150–379)
RBC: 4.09 x10E6/uL — ABNORMAL LOW (ref 4.14–5.80)
RDW: 16 % — ABNORMAL HIGH (ref 12.3–15.4)
WBC: 9.5 10*3/uL (ref 3.4–10.8)

## 2017-10-27 LAB — BASIC METABOLIC PANEL
BUN/Creatinine Ratio: 24 (ref 10–24)
BUN: 36 mg/dL — ABNORMAL HIGH (ref 8–27)
CO2: 24 mmol/L (ref 20–29)
Calcium: 9.6 mg/dL (ref 8.6–10.2)
Chloride: 103 mmol/L (ref 96–106)
Creatinine, Ser: 1.51 mg/dL — ABNORMAL HIGH (ref 0.76–1.27)
GFR calc Af Amer: 52 mL/min/{1.73_m2} — ABNORMAL LOW (ref >59)
GFR calc non Af Amer: 45 mL/min/{1.73_m2} — ABNORMAL LOW (ref >59)
Glucose: 83 mg/dL (ref 65–99)
Potassium: 4.3 mmol/L (ref 3.5–5.2)
Sodium: 144 mmol/L (ref 134–144)

## 2017-10-27 LAB — HEPATIC FUNCTION PANEL
ALT: 20 IU/L (ref 0–44)
AST: 25 IU/L (ref 0–40)
Albumin: 4.2 g/dL (ref 3.5–4.8)
Alkaline Phosphatase: 97 IU/L (ref 39–117)
Bilirubin Total: 0.2 mg/dL (ref 0.0–1.2)
Bilirubin, Direct: 0.09 mg/dL (ref 0.00–0.40)
Total Protein: 7.1 g/dL (ref 6.0–8.5)

## 2017-10-27 LAB — TSH: TSH: 3.01 u[IU]/mL (ref 0.450–4.500)

## 2017-10-27 LAB — PRO B NATRIURETIC PEPTIDE: NT-Pro BNP: 1148 pg/mL — ABNORMAL HIGH (ref 0–376)

## 2017-10-28 ENCOUNTER — Ambulatory Visit: Payer: Medicare HMO | Admitting: Nurse Practitioner

## 2017-11-09 ENCOUNTER — Other Ambulatory Visit: Payer: Self-pay | Admitting: Endocrinology

## 2017-11-09 DIAGNOSIS — E7800 Pure hypercholesterolemia, unspecified: Secondary | ICD-10-CM

## 2017-11-09 DIAGNOSIS — Z794 Long term (current) use of insulin: Secondary | ICD-10-CM

## 2017-11-09 DIAGNOSIS — D500 Iron deficiency anemia secondary to blood loss (chronic): Secondary | ICD-10-CM

## 2017-11-09 DIAGNOSIS — R0600 Dyspnea, unspecified: Secondary | ICD-10-CM

## 2017-11-09 DIAGNOSIS — N183 Chronic kidney disease, stage 3 unspecified: Secondary | ICD-10-CM

## 2017-11-09 DIAGNOSIS — E1122 Type 2 diabetes mellitus with diabetic chronic kidney disease: Secondary | ICD-10-CM

## 2017-11-11 ENCOUNTER — Encounter: Payer: Self-pay | Admitting: Endocrinology

## 2017-11-11 ENCOUNTER — Ambulatory Visit (INDEPENDENT_AMBULATORY_CARE_PROVIDER_SITE_OTHER): Payer: Medicare HMO | Admitting: Endocrinology

## 2017-11-11 VITALS — BP 102/62 | HR 73 | Wt 322.0 lb

## 2017-11-11 DIAGNOSIS — N183 Chronic kidney disease, stage 3 unspecified: Secondary | ICD-10-CM

## 2017-11-11 DIAGNOSIS — Z0000 Encounter for general adult medical examination without abnormal findings: Secondary | ICD-10-CM

## 2017-11-11 DIAGNOSIS — E7800 Pure hypercholesterolemia, unspecified: Secondary | ICD-10-CM

## 2017-11-11 DIAGNOSIS — Z794 Long term (current) use of insulin: Secondary | ICD-10-CM

## 2017-11-11 DIAGNOSIS — E1169 Type 2 diabetes mellitus with other specified complication: Secondary | ICD-10-CM | POA: Diagnosis not present

## 2017-11-11 DIAGNOSIS — R0600 Dyspnea, unspecified: Secondary | ICD-10-CM

## 2017-11-11 DIAGNOSIS — E1122 Type 2 diabetes mellitus with diabetic chronic kidney disease: Secondary | ICD-10-CM

## 2017-11-11 DIAGNOSIS — D500 Iron deficiency anemia secondary to blood loss (chronic): Secondary | ICD-10-CM

## 2017-11-11 DIAGNOSIS — E669 Obesity, unspecified: Secondary | ICD-10-CM | POA: Diagnosis not present

## 2017-11-11 LAB — MICROALBUMIN / CREATININE URINE RATIO
Creatinine,U: 29.5 mg/dL
MICROALB UR: 1.6 mg/dL (ref 0.0–1.9)
Microalb Creat Ratio: 5.4 mg/g (ref 0.0–30.0)

## 2017-11-11 LAB — POCT GLYCOSYLATED HEMOGLOBIN (HGB A1C): Hemoglobin A1C: 7.4

## 2017-11-11 MED ORDER — INSULIN REGULAR HUMAN 100 UNIT/ML IJ SOLN: INTRAMUSCULAR | 3 refills | Status: DC

## 2017-11-11 NOTE — Progress Notes (Signed)
Subjective:    Patient ID: [name], male    DOB: 02-19-1941, 76 y.o.   MRN: [id number]  HPI Pt is here for regular wellness examination, and is feeling pretty well in general, and says chronic med probs are stable, except as noted below Past Medical History:  Diagnosis Date  . Anemia    hx of  . Anxiety   . CAD (coronary artery disease)   . Cancer [facility]) 2014   bladder cancer AND RIGHT URETERAL CANCER  . COPD (chronic obstructive pulmonary disease) (HCC)    CPAP  . Depression   . Diabetes mellitus    TypeII  . Diastolic dysfunction with chronic heart failure ([place])   . Gait difficulty    slow gait"ambulates with walker"  . GERD (gastroesophageal reflux disease)   . 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 H/O Legionnaire's disease 2003  . History of blood clots    R groin  . History of kidney stones   . Hyperkalemia   . Hyperlipemia   . Hypertensive cardiovascular-renal disease   . Hypothyroidism   . Morbid obesity ([place])   . OSA on CPAP    using CPAP although it is hard for him to tolerate  . Osteoarthritis    fingers  . Pancreatitis   . Peripheral neuropathy   . Persistent atrial fibrillation ([place])   . Pneumonia 2003  . Renal insufficiency   . Shortness of breath dyspnea    with exertion  . Sick sinus syndrome ([place]) 06/2010   s/p PPM by St. Jude  . Venous insufficiency     Past Surgical History:  Procedure Laterality Date  . BELPHAROPTOSIS REPAIR Right    Glaucoma  . CARDIAC CATHETERIZATION     11-02-13  . CARDIOVERSION N/A 05/16/2014   Procedure: CARDIOVERSION;  Surgeon: Josue Hector, MD;  Location: [facility] ENDOSCOPY;  Service: Cardiovascular;  Laterality: N/A;  . CARDIOVERSION N/A 11/08/2014   Procedure: CARDIOVERSION;  Surgeon: Fay Records, MD;  Location: [place];  Service: Cardiovascular;  Laterality: N/A;  . CARDIOVERSION N/A 06/22/2015   Procedure: CARDIOVERSION;  Surgeon: Jerline Pain, MD;  Location: [place];  Service:  Cardiovascular;  Laterality: N/A;  . CHOLECYSTECTOMY  2012  . CYSTOSCOPY W/ URETERAL STENT PLACEMENT Right 10/26/2013   Procedure: CYSTOSCOPY WITH RETROGRADE PYELOGRAM/URETERAL STENT PLACEMENT;  Surgeon: Alexis Frock, MD;  [facility];  Service: Urology;  Laterality: Right;  . CYSTOSCOPY WITH URETEROSCOPY AND STENT PLACEMENT Right 01/11/2014   Procedure: CYSTOSCOPY WITH URETEROSCOPY ,RIGHT RETROGRADE AND STENT CHANGE AND LASER OF URETERAL TUMOR;  Surgeon: Alexis Frock, MD;  [facility];  Service: Urology;  Laterality: Right;  . CYSTOSCOPY/RETROGRADE/URETEROSCOPY Right 08/23/2014   Procedure: CYSTOSCOPY/RETROGRADE/ DIAGNOSTIC URETEROSCOPY/RIGHT RENAL STONE EXTRACTION;  Surgeon: Alexis Frock, MD;  [facility];  Service: Urology;  Laterality: Right;  . EMBOLECTOMY Left 11/02/2013   Procedure: LEFT FEMORAL EMBOLECTOMY, LEFT FEMORAL ARTERY ENDARTERECTOMY WITH DACRON PATCH ANGIOPLASTY.;  Surgeon: Mal Misty, MD;  Location: [place];  Service: Vascular;  Laterality: Left;  . EYE SURGERY Left    cataract  . GROIN DEBRIDEMENT Right 05/08/2015   Procedure: REMOVAL OF RIGHT GROIN MASS;  Surgeon: Angelia Mould, MD;  Location: [place];  Service: Vascular;  Laterality: Right;  . HOLMIUM LASER APPLICATION Right 01/11/2682   Procedure: HOLMIUM LASER APPLICATION;  Surgeon: Alexis Frock, MD;  [facility];  Service: Urology;  Laterality: Right;  . INSERT / REPLACE /  REMOVE PACEMAKER  2011  . NASAL SEPTUM SURGERY  1967  . PACEMAKER PLACEMENT  06/2010   [facility] Accent RF DR, Model [id number] ( Serial number [id number])  . TEE WITHOUT CARDIOVERSION N/A 05/16/2014   Procedure: TRANSESOPHAGEAL ECHOCARDIOGRAM (TEE);  Surgeon: Josue Hector, MD;  Location: [facility] ENDOSCOPY;  Service: Cardiovascular;  Laterality: N/A;  . thromboembolectomy and four compartment fasciotomy Right 2009   leg  . TRANSURETHRAL RESECTION OF BLADDER TUMOR WITH GYRUS (TURBT-GYRUS) N/A 10/26/2013   Procedure:  TRANSURETHRAL RESECTION OF BLADDER TUMOR WITH GYRUS (TURBT-GYRUS);  Surgeon: Alexis Frock, MD;  [facility];  Service: Urology;  Laterality: N/A;  . TRANSURETHRAL RESECTION OF BLADDER TUMOR WITH GYRUS (TURBT-GYRUS) N/A 01/11/2014   Procedure: TRANSURETHRAL RESECTION OF BLADDER TUMOR WITH GYRUS (TURBT-GYRUS);  Surgeon: Alexis Frock, MD;  [facility];  Service: Urology;  Laterality: N/A;  . WISDOM TOOTH EXTRACTION      Social History   Socioeconomic History  . Marital status: Married    Spouse name: Not on file  . Number of children: Not on file  . Years of education: Not on file  . Highest education level: Not on file  Social Needs  . Financial resource strain: Not on file  . Food insecurity - worry: Not on file  . Food insecurity - inability: Not on file  . Transportation needs - medical: Not on file  . Transportation needs - non-medical: Not on file  Occupational History  . 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 Smoking status: Former Smoker    Packs/day: 2.00    Years: 50.00    Pack years: 100.00    Types: Cigarettes    Last attempt to quit: 12/08/2006    Years since quitting: 10.9  . Smokeless tobacco: Never Used  Substance and Sexual Activity  . Alcohol use: No    Alcohol/week: 0.0 oz    Comment: quit 3 years ago  . Drug use: No  . Sexual activity: Not Currently  Other Topics Concern  . Not on file  Social History Narrative  . Not on file    Current Outpatient Medications on File Prior to Visit  Medication Sig Dispense Refill  . acetaminophen (TYLENOL) 500 MG tablet Take 500-1,000 mg by mouth 2 (two) times daily as needed for moderate pain.     Marland Kitchen albuterol (PROVENTIL) (2.5 MG/3ML) 0.083% nebulizer solution Take 3 mLs (2.5 mg total) by nebulization every 4 (four) hours as needed for wheezing or shortness of breath. 75 mL 1  . albuterol (VENTOLIN HFA) 108 (90 Base) MCG/ACT inhaler Inhale 2 puffs into the  lungs every 6 (six) hours as needed for wheezing or shortness of breath. 18 Inhaler 1  . ALPRAZolam (XANAX) 0.25 MG tablet TAKE 1 TABLET BY MOUTH 3 TIMES A DAY AS NEEDED FOR ANXIETY OR SLEEP 50 tablet 0  . amiodarone (PACERONE) 200 MG tablet Take 1 tablet (200 mg total) by mouth daily. 90 tablet 3  . apixaban (ELIQUIS) 5 MG TABS tablet Take 1 tablet (5 mg total) by mouth 2 (two) times daily. 180 tablet 3  . Ascorbic Acid (VITAMIN C) 500 MG tablet Take 500 mg by mouth daily.     . brimonidine (ALPHAGAN P) 0.1 % SOLN Place 1 drop into the left eye 3 (three) times daily.     . calcium carbonate (TUMS - DOSED IN MG ELEMENTAL CALCIUM) 500 MG chewable tablet Chew 2  tablets by mouth 2 (two) times daily as needed for indigestion or heartburn.    . cholecalciferol (VITAMIN D) 1000 UNITS tablet Take 1,000 Units by mouth 2 (two) times daily.     Marland Kitchen dextromethorphan (DELSYM) 30 MG/5ML liquid Take 90 mg by mouth 2 (two) times daily as needed for cough.    . docusate sodium (COLACE) 100 MG capsule Take 100 mg by mouth daily as needed for mild constipation.    . dorzolamide (TRUSOPT) 2 % ophthalmic solution Place 1 drop into both eyes 2 (two) times daily.    . fluticasone (FLONASE) 50 MCG/ACT nasal spray USE 2 SPRAYS IN EACN NOSTRIL DAILY 16 g 3  . furosemide (LASIX) 40 MG tablet Take 60 mg in the AM and 20 mg in the early afternoon - total dose of 80 mg a day 180 tablet 3  . guaifenesin (HUMIBID E) 400 MG TABS tablet Take 400 mg by mouth 2 (two) times daily as needed (for congestion).     Marland Kitchen ipratropium-albuterol (DUONEB) 0.5-2.5 (3) MG/3ML SOLN Take 3 mLs by nebulization every 4 (four) hours as needed. 360 mL 1  . Lactobacillus (ACIDOPHILUS) CAPS capsule Take 1 capsule by mouth daily.    Marland Kitchen levothyroxine (SYNTHROID, LEVOTHROID) 75 MCG tablet Take 1 tablet (75 mcg total) by mouth daily before breakfast. 90 tablet 3  . metoprolol tartrate (LOPRESSOR) 25 MG tablet Take 1 tablet (25 mg total) by mouth 3 (three) times  daily. 270 tablet 3  . Multiple Vitamin (MULTIVITAMIN WITH MINERALS) TABS tablet Take 1 tablet by mouth daily.    Mckinley Jewel Dimesylate (RHOPRESSA) 0.02 % SOLN Place 1 drop into the left eye daily.    . Omega-3 Fatty Acids (FISH OIL) 1000 MG CAPS Take 1,000 mg by mouth every morning.     Marland Kitchen omeprazole (PRILOSEC) 40 MG capsule TAKE 1 CAPSULE DAILY 90 capsule 1  . rosuvastatin (CRESTOR) 40 MG tablet Take 20 mg by mouth every evening.    . Timolol Maleate 0.5 % (DAILY) SOLN Place 1 drop into both eyes 2 (two) times daily.    . traMADol (ULTRAM) 50 MG tablet TAKE 2 TABLETS BY MOUTH TWICE A DAY AS NEEDED FOR PAIN 60 tablet 2  . travoprost, benzalkonium, (TRAVATAN) 0.004 % ophthalmic solution Place 1 drop into the left eye at bedtime.     No current facility-administered medications on file prior to visit.     Allergies  Allergen Reactions  . Actos [Pioglitazone] Swelling  . Timolol Other (See Comments)    Slow heart rate but tolerates Cosopt (dorzolamide-timolol)    Family History  Problem Relation Age of Onset  . Liver cancer Mother        deceased age 98  . Cancer Mother        liver cancer  . Heart attack Father   . Colon cancer Neg Hx     BP 102/62 (BP Location: Right Arm, Patient Position: Sitting, Cuff Size: Normal)   Pulse 73   Wt (!) 322 lb (146.1 kg)   SpO2 95%   BMI 46.20 kg/m     Review of Systems Denies fever, fatigue, visual loss, chest pain, memory loss, cold intolerance, BRBPR, hematuria, and rash.  No change in chronic hearing loss, back pain, foot numbness, allergy sxs, easy bruising, or sob.      Objective:   Physical Exam VS: see vs page.  GEN: no distress.  HEAD: head: no deformity eyes: no periorbital swelling, no proptosis.  external nose  and ears are normal.  mouth: no lesion seen NECK: supple, thyroid is not enlarged CHEST WALL: no deformity LUNGS: clear to auscultation CV: reg rate and rhythm; high-pitched systolic murmur ABD: abdomen is  soft, nontender.  no hepatosplenomegaly.  not distended.  no hernia.  MUSCULOSKELETAL: muscle bulk and strength are grossly normal.  no obvious joint swelling.  gait is steady EXTEMITIES: no deformity.  no edema PULSES: no carotid bruit NEURO:  cn 2-12 grossly intact, except for hearing loss.   readily moves all 4's.  sensation is intact to touch on all 4's.  SKIN:  Normal texture and temperature.  No rash or suspicious lesion is visible.   NODES:  None palpable at the neck PSYCH: alert, well-oriented.  Does not appear anxious nor depressed.      Assessment & Plan:  Wellness visit today, with problems stable, except as noted.    SEPARATE EVALUATION FOLLOWS--EACH PROBLEM HERE IS NEW, NOT RESPONDING TO TREATMENT, OR POSES SIGNIFICANT RISK TO THE PATIENT'S HEALTH: HISTORY OF THE PRESENT ILLNESS: Pt returns for f/u of diabetes mellitus:  DM type: Insulin-requiring type 2.  Dx'ed: 1988.  Complications: renal insufficiency, polyneuropathy, and CAD.   Therapy: insulin since 1989.  DKA: never.   Severe hypoglycemia: never.   Pancreatitis: several episodes, most recently in 2013.  Other: He brings a record of his cbg's which I have reviewed today.  It varies from 45-300.  It is in general highest fasting, and lowest at HS PAST MEDICAL HISTORY: Past Medical History:  Diagnosis Date  . Anemia    hx of  . Anxiety   . CAD (coronary artery disease)   . Cancer [facility]) 2014   bladder cancer AND RIGHT URETERAL CANCER  . COPD (chronic obstructive pulmonary disease) (HCC)    CPAP  . Depression   . Diabetes mellitus    TypeII  . Diastolic dysfunction with chronic heart failure ([place])   . Gait difficulty    slow gait"ambulates with walker"  . GERD (gastroesophageal reflux disease)   . 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 H/O Legionnaire's disease 2003  . History of blood clots    R groin  . History of kidney stones   . Hyperkalemia   . Hyperlipemia   . Hypertensive  cardiovascular-renal disease   . Hypothyroidism   . Morbid obesity ([place])   . OSA on CPAP    using CPAP although it is hard for him to tolerate  . Osteoarthritis    fingers  . Pancreatitis   . Peripheral neuropathy   . Persistent atrial fibrillation ([place])   . Pneumonia 2003  . Renal insufficiency   . Shortness of breath dyspnea    with exertion  . Sick sinus syndrome ([place]) 06/2010   s/p PPM by St. Jude  . Venous insufficiency     Past Surgical History:  Procedure Laterality Date  . BELPHAROPTOSIS REPAIR Right    Glaucoma  . CARDIAC CATHETERIZATION     11-02-13  . CARDIOVERSION N/A 05/16/2014   Procedure: CARDIOVERSION;  Surgeon: Josue Hector, MD;  Location: [facility] ENDOSCOPY;  Service: Cardiovascular;  Laterality: N/A;  . CARDIOVERSION N/A 11/08/2014   Procedure: CARDIOVERSION;  Surgeon: Fay Records, MD;  Location: [place];  Service: Cardiovascular;  Laterality: N/A;  . CARDIOVERSION N/A 06/22/2015   Procedure: CARDIOVERSION;  Surgeon: Jerline Pain, MD;  Location: [place];  Service: Cardiovascular;  Laterality: N/A;  . CHOLECYSTECTOMY  2012  .  CYSTOSCOPY W/ URETERAL STENT PLACEMENT Right 10/26/2013   Procedure: CYSTOSCOPY WITH RETROGRADE PYELOGRAM/URETERAL STENT PLACEMENT;  Surgeon: Alexis Frock, MD;  [facility];  Service: Urology;  Laterality: Right;  . CYSTOSCOPY WITH URETEROSCOPY AND STENT PLACEMENT Right 01/11/2014   Procedure: CYSTOSCOPY WITH URETEROSCOPY ,RIGHT RETROGRADE AND STENT CHANGE AND LASER OF URETERAL TUMOR;  Surgeon: Alexis Frock, MD;  [facility];  Service: Urology;  Laterality: Right;  . CYSTOSCOPY/RETROGRADE/URETEROSCOPY Right 08/23/2014   Procedure: CYSTOSCOPY/RETROGRADE/ DIAGNOSTIC URETEROSCOPY/RIGHT RENAL STONE EXTRACTION;  Surgeon: Alexis Frock, MD;  [facility];  Service: Urology;  Laterality: Right;  . EMBOLECTOMY Left 11/02/2013   Procedure: LEFT FEMORAL EMBOLECTOMY, LEFT FEMORAL ARTERY ENDARTERECTOMY WITH DACRON PATCH  ANGIOPLASTY.;  Surgeon: Mal Misty, MD;  Location: [place];  Service: Vascular;  Laterality: Left;  . EYE SURGERY Left    cataract  . GROIN DEBRIDEMENT Right 05/08/2015   Procedure: REMOVAL OF RIGHT GROIN MASS;  Surgeon: Angelia Mould, MD;  Location: [place];  Service: Vascular;  Laterality: Right;  . HOLMIUM LASER APPLICATION Right 02/07/3556   Procedure: HOLMIUM LASER APPLICATION;  Surgeon: Alexis Frock, MD;  [facility];  Service: Urology;  Laterality: Right;  . INSERT / REPLACE / REMOVE PACEMAKER  2011  . NASAL SEPTUM SURGERY  1967  . PACEMAKER PLACEMENT  06/2010   [facility] Accent RF DR, Model [id number] ( Serial number [id number])  . TEE WITHOUT CARDIOVERSION N/A 05/16/2014   Procedure: TRANSESOPHAGEAL ECHOCARDIOGRAM (TEE);  Surgeon: Josue Hector, MD;  Location: [facility] ENDOSCOPY;  Service: Cardiovascular;  Laterality: N/A;  . thromboembolectomy and four compartment fasciotomy Right 2009   leg  . TRANSURETHRAL RESECTION OF BLADDER TUMOR WITH GYRUS (TURBT-GYRUS) N/A 10/26/2013   Procedure: TRANSURETHRAL RESECTION OF BLADDER TUMOR WITH GYRUS (TURBT-GYRUS);  Surgeon: Alexis Frock, MD;  [facility];  Service: Urology;  Laterality: N/A;  . TRANSURETHRAL RESECTION OF BLADDER TUMOR WITH GYRUS (TURBT-GYRUS) N/A 01/11/2014   Procedure: TRANSURETHRAL RESECTION OF BLADDER TUMOR WITH GYRUS (TURBT-GYRUS);  Surgeon: Alexis Frock, MD;  [facility];  Service: Urology;  Laterality: N/A;  . WISDOM TOOTH EXTRACTION      Social History   Socioeconomic History  . Marital status: Married    Spouse name: Not on file  . Number of children: Not on file  . Years of education: Not on file  . Highest education level: Not on file  Social Needs  . Financial resource strain: Not on file  . Food insecurity - worry: Not on file  . Food insecurity - inability: Not on file  . Transportation needs - medical: Not on file  . Transportation needs - non-medical: Not on file  Occupational History    . 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 Smoking status: Former Smoker    Packs/day: 2.00    Years: 50.00    Pack years: 100.00    Types: Cigarettes    Last attempt to quit: 12/08/2006    Years since quitting: 10.9  . Smokeless tobacco: Never Used  Substance and Sexual Activity  . Alcohol use: No    Alcohol/week: 0.0 oz    Comment: quit 3 years ago  . Drug use: No  . Sexual activity: Not Currently  Other Topics Concern  . Not on file  Social History Narrative  . Not on file    Current Outpatient Medications on File Prior to Visit  Medication Sig Dispense Refill  .  acetaminophen (TYLENOL) 500 MG tablet Take 500-1,000 mg by mouth 2 (two) times daily as needed for moderate pain.     Marland Kitchen albuterol (PROVENTIL) (2.5 MG/3ML) 0.083% nebulizer solution Take 3 mLs (2.5 mg total) by nebulization every 4 (four) hours as needed for wheezing or shortness of breath. 75 mL 1  . albuterol (VENTOLIN HFA) 108 (90 Base) MCG/ACT inhaler Inhale 2 puffs into the lungs every 6 (six) hours as needed for wheezing or shortness of breath. 18 Inhaler 1  . ALPRAZolam (XANAX) 0.25 MG tablet TAKE 1 TABLET BY MOUTH 3 TIMES A DAY AS NEEDED FOR ANXIETY OR SLEEP 50 tablet 0  . amiodarone (PACERONE) 200 MG tablet Take 1 tablet (200 mg total) by mouth daily. 90 tablet 3  . apixaban (ELIQUIS) 5 MG TABS tablet Take 1 tablet (5 mg total) by mouth 2 (two) times daily. 180 tablet 3  . Ascorbic Acid (VITAMIN C) 500 MG tablet Take 500 mg by mouth daily.     . brimonidine (ALPHAGAN P) 0.1 % SOLN Place 1 drop into the left eye 3 (three) times daily.     . calcium carbonate (TUMS - DOSED IN MG ELEMENTAL CALCIUM) 500 MG chewable tablet Chew 2 tablets by mouth 2 (two) times daily as needed for indigestion or heartburn.    . cholecalciferol (VITAMIN D) 1000 UNITS tablet Take 1,000 Units by mouth 2 (two) times daily.     Marland Kitchen dextromethorphan (DELSYM) 30 MG/5ML liquid Take 90 mg by mouth 2  (two) times daily as needed for cough.    . docusate sodium (COLACE) 100 MG capsule Take 100 mg by mouth daily as needed for mild constipation.    . dorzolamide (TRUSOPT) 2 % ophthalmic solution Place 1 drop into both eyes 2 (two) times daily.    . fluticasone (FLONASE) 50 MCG/ACT nasal spray USE 2 SPRAYS IN EACN NOSTRIL DAILY 16 g 3  . furosemide (LASIX) 40 MG tablet Take 60 mg in the AM and 20 mg in the early afternoon - total dose of 80 mg a day 180 tablet 3  . guaifenesin (HUMIBID E) 400 MG TABS tablet Take 400 mg by mouth 2 (two) times daily as needed (for congestion).     Marland Kitchen ipratropium-albuterol (DUONEB) 0.5-2.5 (3) MG/3ML SOLN Take 3 mLs by nebulization every 4 (four) hours as needed. 360 mL 1  . Lactobacillus (ACIDOPHILUS) CAPS capsule Take 1 capsule by mouth daily.    Marland Kitchen levothyroxine (SYNTHROID, LEVOTHROID) 75 MCG tablet Take 1 tablet (75 mcg total) by mouth daily before breakfast. 90 tablet 3  . metoprolol tartrate (LOPRESSOR) 25 MG tablet Take 1 tablet (25 mg total) by mouth 3 (three) times daily. 270 tablet 3  . Multiple Vitamin (MULTIVITAMIN WITH MINERALS) TABS tablet Take 1 tablet by mouth daily.    Mckinley Jewel Dimesylate (RHOPRESSA) 0.02 % SOLN Place 1 drop into the left eye daily.    . Omega-3 Fatty Acids (FISH OIL) 1000 MG CAPS Take 1,000 mg by mouth every morning.     Marland Kitchen omeprazole (PRILOSEC) 40 MG capsule TAKE 1 CAPSULE DAILY 90 capsule 1  . rosuvastatin (CRESTOR) 40 MG tablet Take 20 mg by mouth every evening.    . Timolol Maleate 0.5 % (DAILY) SOLN Place 1 drop into both eyes 2 (two) times daily.    . traMADol (ULTRAM) 50 MG tablet TAKE 2 TABLETS BY MOUTH TWICE A DAY AS NEEDED FOR PAIN 60 tablet 2  . travoprost, benzalkonium, (TRAVATAN) 0.004 % ophthalmic solution Place  1 drop into the left eye at bedtime.     No current facility-administered medications on file prior to visit.     Allergies  Allergen Reactions  . Actos [Pioglitazone] Swelling  . Timolol Other (See  Comments)    Slow heart rate but tolerates Cosopt (dorzolamide-timolol)    Family History  Problem Relation Age of Onset  . Liver cancer Mother        deceased age 20  . Cancer Mother        liver cancer  . Heart attack Father   . Colon cancer Neg Hx     BP 102/62 (BP Location: Right Arm, Patient Position: Sitting, Cuff Size: Normal)   Pulse 73   Wt (!) 322 lb (146.1 kg)   SpO2 95%   BMI 46.20 kg/m   REVIEW OF SYSTEMS: Denies LOC PHYSICAL EXAMINATION: VITAL SIGNS:  See vs page GENERAL: no distress Pulses: dorsalis pedis absent bilat (prob due to edema).  MSK: no deformity of the feet or ankles.   CV: 1+ bilat edema of the legs.   Skin:  no ulcer on the feet or ankles, but the skin is dry.  normal color and temp on the feet and ankles. Several healed surgical scars at the right leg. Neuro: sensation is intact to touch on the feet and ankles.   Ext: There is bilateral onychomycosis of the toenails.   LAB/XRAY RESULTS: Lab Results  Component Value Date   HGBA1C 7.4 11/11/2017   IMPRESSION: Insulin-requiring type 2 DM: this is the best control this pt should aim for, given variable cbg's PLAN:  Change insulin to the numbers listed below

## 2017-11-11 NOTE — Patient Instructions (Signed)
Please consider these measures for your health:  minimize alcohol.  Do not use tobacco products.  Have a colonoscopy at least every 10 years from age 75.  Keep firearms safely stored.  Always use seat belts.  have working smoke alarms in your home.  See an eye doctor and dentist regularly.  Never drive under the influence of alcohol or drugs (including prescription drugs).  Those with fair skin should take precautions against the sun, and should carefully examine their skin once per month, for any new or changed moles. It is critically important to prevent falling down (keep floor areas well-lit, dry, and free of loose objects.  If you have a cane, walker, or wheelchair, you should use it, even for short trips around the house.  Wear flat-soled shoes.  Also, try not to rush). Please change insulin to the numbers listed below. Please come back for a follow-up appointment in 3 months.

## 2017-11-20 ENCOUNTER — Ambulatory Visit: Payer: Medicare HMO | Admitting: Adult Health

## 2017-11-23 ENCOUNTER — Encounter: Payer: Self-pay | Admitting: Adult Health

## 2017-11-23 ENCOUNTER — Ambulatory Visit: Payer: Medicare HMO | Admitting: Adult Health

## 2017-11-23 DIAGNOSIS — G4733 Obstructive sleep apnea (adult) (pediatric): Secondary | ICD-10-CM | POA: Diagnosis not present

## 2017-11-23 MED ORDER — ALBUTEROL SULFATE HFA 108 (90 BASE) MCG/ACT IN AERS: 2.0000 | INHALATION_SPRAY | Freq: Four times a day (QID) | RESPIRATORY_TRACT | 1 refills | Status: AC | PRN

## 2017-11-23 MED ORDER — BUDESONIDE-FORMOTEROL FUMARATE 80-4.5 MCG/ACT IN AERO: 2.0000 | INHALATION_SPRAY | Freq: Two times a day (BID) | RESPIRATORY_TRACT | 0 refills | Status: DC

## 2017-11-23 NOTE — Assessment & Plan Note (Signed)
More restrictive lung dz ? Obesity.  Will try Symbicort to see if helps sx .  He is on Amiodarone if not imrpoving may need to order CT chest ,labs with ESR and full PFT w/ DLCO to evaluate further   Plan  Patient Instructions  Trial of Symbicort 2 puffs Twice daily   , rinse after use .  Use Ventolin 2 puffs every 4hr as needed.  Continue on CPAP At bedtime   Follow up with Dr. Elsworth Soho  In 3 months and As needed   Please contact office for sooner follow up if symptoms do not improve or worsen or seek emergency care

## 2017-11-23 NOTE — Patient Instructions (Signed)
Trial of Symbicort 2 puffs Twice daily   , rinse after use .  Use Ventolin 2 puffs every 4hr as needed.  Continue on CPAP At bedtime   Follow up with Dr. Elsworth Soho  In 3 months and As needed   Please contact office for sooner follow up if symptoms do not improve or worsen or seek emergency care

## 2017-11-23 NOTE — Progress Notes (Signed)
$'@Patient'v$  ID: VINAY ERTL, male    DOB: [birth year], 76 y.o.   MRN: [id number]  No chief complaint on file.   Referring provider: Renato Shin, MD  HPI: 76 yo male former smoker followed for severe OSA  And dyspnea  Has CHF (EF 45%)   Significant tests/ events reviewed  NPSG 2003: AHI 81/hr.  PFTs 05/2008 ratio 66, FEV1 110%, diffusion 72% and corrects to 99% from with a volume  Spirometry 09/2017 shows ratio 71, FEV1 60% and FVC 61%  11/23/2017 Follow up : OSA /Dyspnea  Pt returns for 2 month follow up . Pt has known severe OSA . He uses CPAP each night for 6-7 hrs. Feels rested . Machine is getting old and is unable to afford new machine through his DME . Monthly premium is too high .  Unable to get download from current machine .   Pt has hx of smoking . Spirometry last ov with moderate restriction . He says he gets winded easily and has dry cough . Felt to have component of volume overload , lasix was increased he has combined CHF . Followed by cards and is now on lasix '60mg'$  daily.  Has helped some , uses ventolin twice daily which helps  He is on Amiodarone .      Allergies  Allergen Reactions  . Actos [Pioglitazone] Swelling  . Timolol Other (See Comments)    Slow heart rate but tolerates Cosopt (dorzolamide-timolol)    Immunization History  Administered Date(s) Administered  . Influenza Split 09/28/2013  . Influenza Whole 10/08/2008, 08/28/2009, 09/08/2011  . Influenza, High Dose Seasonal PF 08/07/2017  . Influenza,inj,Quad PF,6+ Mos 08/09/2014, 10/02/2015  . Pneumococcal Conjugate-13 08/09/2014  . Pneumococcal Polysaccharide-23 04/24/2008  . Td 06/08/2003  . Tdap 12/21/2012  . Zoster 08/26/2011    Past Medical History:  Diagnosis Date  . Anemia    hx of  . Anxiety   . CAD (coronary artery disease)   . Cancer [facility]) 2014   bladder cancer AND RIGHT URETERAL CANCER  . COPD (chronic obstructive pulmonary disease) (HCC)    CPAP  . Depression   .  Diabetes mellitus    TypeII  . Diastolic dysfunction with chronic heart failure ([place])   . Gait difficulty    slow gait"ambulates with walker"  . GERD (gastroesophageal reflux disease)   . 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 H/O Legionnaire's disease 2003  . History of blood clots    R groin  . History of kidney stones   . Hyperkalemia   . Hyperlipemia   . Hypertensive cardiovascular-renal disease   . Hypothyroidism   . Morbid obesity ([place])   . OSA on CPAP    using CPAP although it is hard for him to tolerate  . Osteoarthritis    fingers  . Pancreatitis   . Peripheral neuropathy   . Persistent atrial fibrillation ([place])   . Pneumonia 2003  . Renal insufficiency   . Shortness of breath dyspnea    with exertion  . Sick sinus syndrome ([place]) 06/2010   s/p PPM by [employer]  . Venous insufficiency     Tobacco History: Social History   Tobacco Use  Smoking Status Former Smoker  . Packs/day: 2.00  . Years: 50.00  . Pack years: 100.00  . Types: Cigarettes  . Last attempt to quit: 12/08/2006  . Years since quitting: 10.9  Smokeless Tobacco Never Used   Counseling given:  Not Answered   Outpatient Encounter Medications as of 11/23/2017  Medication Sig  . acetaminophen (TYLENOL) 500 MG tablet Take 500-1,000 mg by mouth 2 (two) times daily as needed for moderate pain.   Marland Kitchen albuterol (PROVENTIL) (2.5 MG/3ML) 0.083% nebulizer solution Take 3 mLs (2.5 mg total) by nebulization every 4 (four) hours as needed for wheezing or shortness of breath.  Marland Kitchen albuterol (VENTOLIN HFA) 108 (90 Base) MCG/ACT inhaler Inhale 2 puffs into the lungs every 6 (six) hours as needed for wheezing or shortness of breath.  . ALPRAZolam (XANAX) 0.25 MG tablet TAKE 1 TABLET BY MOUTH 3 TIMES A DAY AS NEEDED FOR ANXIETY OR SLEEP  . amiodarone (PACERONE) 200 MG tablet Take 1 tablet (200 mg total) by mouth daily.  Marland Kitchen apixaban (ELIQUIS) 5 MG TABS tablet Take 1 tablet (5 mg total) by mouth 2 (two) times  daily.  . Ascorbic Acid (VITAMIN C) 500 MG tablet Take 500 mg by mouth daily.   . brimonidine (ALPHAGAN P) 0.1 % SOLN Place 1 drop into the left eye 3 (three) times daily.   . calcium carbonate (TUMS - DOSED IN MG ELEMENTAL CALCIUM) 500 MG chewable tablet Chew 2 tablets by mouth 2 (two) times daily as needed for indigestion or heartburn.  . cholecalciferol (VITAMIN D) 1000 UNITS tablet Take 1,000 Units by mouth 2 (two) times daily.   Marland Kitchen dextromethorphan (DELSYM) 30 MG/5ML liquid Take 90 mg by mouth 2 (two) times daily as needed for cough.  . docusate sodium (COLACE) 100 MG capsule Take 100 mg by mouth daily as needed for mild constipation.  . dorzolamide (TRUSOPT) 2 % ophthalmic solution Place 1 drop into both eyes 2 (two) times daily.  . fluticasone (FLONASE) 50 MCG/ACT nasal spray USE 2 SPRAYS IN EACN NOSTRIL DAILY  . furosemide (LASIX) 40 MG tablet Take 60 mg in the AM and 20 mg in the early afternoon - total dose of 80 mg a day  . guaifenesin (HUMIBID E) 400 MG TABS tablet Take 400 mg by mouth 2 (two) times daily as needed (for congestion).   . insulin regular (NOVOLIN R) 100 units/mL injection 4 times a day (just before each meal), 300 units with breakfast, 210 units with lunch, 220 units  at supper, and 190 units with bedtime snack.  Marland Kitchen ipratropium-albuterol (DUONEB) 0.5-2.5 (3) MG/3ML SOLN Take 3 mLs by nebulization every 4 (four) hours as needed.  . Lactobacillus (ACIDOPHILUS) CAPS capsule Take 1 capsule by mouth daily.  Marland Kitchen levothyroxine (SYNTHROID, LEVOTHROID) 75 MCG tablet Take 1 tablet (75 mcg total) by mouth daily before breakfast.  . metoprolol tartrate (LOPRESSOR) 25 MG tablet Take 1 tablet (25 mg total) by mouth 3 (three) times daily.  . Multiple Vitamin (MULTIVITAMIN WITH MINERALS) TABS tablet Take 1 tablet by mouth daily.  Mckinley Jewel Dimesylate (RHOPRESSA) 0.02 % SOLN Place 1 drop into the left eye daily.  . Omega-3 Fatty Acids (FISH OIL) 1000 MG CAPS Take 1,000 mg by mouth every  morning.   Marland Kitchen omeprazole (PRILOSEC) 40 MG capsule TAKE 1 CAPSULE DAILY  . rosuvastatin (CRESTOR) 40 MG tablet Take 20 mg by mouth every evening.  . Timolol Maleate 0.5 % (DAILY) SOLN Place 1 drop into both eyes 2 (two) times daily.  . traMADol (ULTRAM) 50 MG tablet TAKE 2 TABLETS BY MOUTH TWICE A DAY AS NEEDED FOR PAIN  . travoprost, benzalkonium, (TRAVATAN) 0.004 % ophthalmic solution Place 1 drop into the left eye at bedtime.  . [DISCONTINUED] albuterol (VENTOLIN HFA)  108 (90 Base) MCG/ACT inhaler Inhale 2 puffs into the lungs every 6 (six) hours as needed for wheezing or shortness of breath.  . budesonide-formoterol (SYMBICORT) 80-4.5 MCG/ACT inhaler Inhale 2 puffs into the lungs 2 (two) times daily.   No facility-administered encounter medications on file as of 11/23/2017.      Review of Systems  Constitutional:   No  weight loss, night sweats,  Fevers, chills,  +fatigue, or  lassitude.  HEENT:   No headaches,  Difficulty swallowing,  Tooth/dental problems, or  Sore throat,                No sneezing, itching, ear ache, nasal congestion, post nasal drip,   CV:  No chest pain,  Orthopnea, PND, swelling in lower extremities, anasarca, dizziness, palpitations, syncope.   GI  No heartburn, indigestion, abdominal pain, nausea, vomiting, diarrhea, change in bowel habits, loss of appetite, bloody stools.   Resp: .  No chest wall deformity  Skin: no rash or lesions.  GU: no dysuria, change in color of urine, no urgency or frequency.  No flank pain, no hematuria   MS:  No joint pain or swelling.  No decreased range of motion.  No back pain.    Physical Exam  BP 122/74 (BP Location: Left Arm, Cuff Size: Large)   Pulse 72   Ht '5\' 10"'$  (1.778 m)   Wt (!) 323 lb 12.8 oz (146.9 kg)   SpO2 96%   BMI 46.46 kg/m   GEN: A/Ox3; pleasant , NAD, obese    HEENT:  [place]/AT,  EACs-clear, TMs-wnl, NOSE-clear, THROAT-clear, no lesions, no postnasal drip or exudate noted. Class 3 MP airway    NECK:  Supple w/ fair ROM; no JVD; normal carotid impulses w/o bruits; no thyromegaly or nodules palpated; no lymphadenopathy.    RESP  Clear  P & A; w/o, wheezes/ rales/ or rhonchi. no accessory muscle use, no dullness to percussion  CARD:  RRR, no m/r/g,2+  peripheral edema, pulses intact, no cyanosis or clubbing.  GI:   Soft & nt; nml bowel sounds; no organomegaly or masses detected.   Musco: Warm bil, no deformities or joint swelling noted.   Neuro: alert, no focal deficits noted.    Skin: Warm, no lesions or rashes    Lab Results:  CBC  BMET  BNP  Imaging: No results found.   Assessment & Plan:   Obstructive sleep apnea Doing okay on current machine . His machine is old and unable to afford new CPAP  We have a donated CPAP that he will be supplied from our office , approved from Dr. Annamaria Boots  -  Plan  Patient Instructions  Trial of Symbicort 2 puffs Twice daily   , rinse after use .  Use Ventolin 2 puffs every 4hr as needed.  Continue on CPAP At bedtime   Follow up with Dr. Elsworth Soho  In 3 months and As needed   Please contact office for sooner follow up if symptoms do not improve or worsen or seek emergency care       COPD More restrictive lung dz ? Obesity.  Will try Symbicort to see if helps sx .  He is on Amiodarone if not imrpoving may need to order CT chest ,labs with ESR and full PFT w/ DLCO to evaluate further   Plan  Patient Instructions  Trial of Symbicort 2 puffs Twice daily   , rinse after use .  Use Ventolin 2 puffs every 4hr as needed.  Continue on CPAP At bedtime   Follow up with Dr. Elsworth Soho  In 3 months and As needed   Please contact office for sooner follow up if symptoms do not improve or worsen or seek emergency care          Rexene Edison, NP 11/23/2017

## 2017-11-23 NOTE — Assessment & Plan Note (Signed)
Doing okay on current machine . His machine is old and unable to afford new CPAP  We have a donated CPAP that he will be supplied from our office , approved from Dr. Annamaria Boots  -  Plan  Patient Instructions  Trial of Symbicort 2 puffs Twice daily   , rinse after use .  Use Ventolin 2 puffs every 4hr as needed.  Continue on CPAP At bedtime   Follow up with Dr. Elsworth Soho  In 3 months and As needed   Please contact office for sooner follow up if symptoms do not improve or worsen or seek emergency care

## 2017-11-26 ENCOUNTER — Other Ambulatory Visit: Payer: Self-pay | Admitting: Nurse Practitioner

## 2017-12-07 ENCOUNTER — Ambulatory Visit: Payer: Medicare HMO | Admitting: Nurse Practitioner

## 2017-12-09 DIAGNOSIS — H40052 Ocular hypertension, left eye: Secondary | ICD-10-CM | POA: Diagnosis not present

## 2017-12-09 DIAGNOSIS — H401133 Primary open-angle glaucoma, bilateral, severe stage: Secondary | ICD-10-CM | POA: Diagnosis not present

## 2017-12-28 ENCOUNTER — Emergency Department (HOSPITAL_COMMUNITY): Payer: Medicare HMO

## 2017-12-28 ENCOUNTER — Emergency Department (HOSPITAL_COMMUNITY): Admit: 2017-12-28 | Discharge: 2017-12-28 | Disposition: A | Discharge status: Home / Self Care | Payer: Medicare HMO

## 2017-12-28 ENCOUNTER — Inpatient Hospital Stay (HOSPITAL_COMMUNITY)
Admission: EM | Admit: 2017-12-28 | Discharge: 2018-01-02 | DRG: 872 | Disposition: A | Discharge status: Home Health | Payer: Medicare HMO | Attending: Internal Medicine | Admitting: Internal Medicine

## 2017-12-28 ENCOUNTER — Encounter (HOSPITAL_COMMUNITY): Payer: Self-pay | Admitting: Emergency Medicine

## 2017-12-28 DIAGNOSIS — B9729 Other coronavirus as the cause of diseases classified elsewhere: Secondary | ICD-10-CM | POA: Diagnosis present

## 2017-12-28 DIAGNOSIS — E11649 Type 2 diabetes mellitus with hypoglycemia without coma: Secondary | ICD-10-CM | POA: Diagnosis not present

## 2017-12-28 DIAGNOSIS — E11622 Type 2 diabetes mellitus with other skin ulcer: Secondary | ICD-10-CM | POA: Diagnosis not present

## 2017-12-28 DIAGNOSIS — R7881 Bacteremia: Secondary | ICD-10-CM

## 2017-12-28 DIAGNOSIS — J189 Pneumonia, unspecified organism: Secondary | ICD-10-CM | POA: Diagnosis not present

## 2017-12-28 DIAGNOSIS — E1169 Type 2 diabetes mellitus with other specified complication: Secondary | ICD-10-CM | POA: Diagnosis present

## 2017-12-28 DIAGNOSIS — Z86718 Personal history of other venous thrombosis and embolism: Secondary | ICD-10-CM | POA: Diagnosis not present

## 2017-12-28 DIAGNOSIS — Z8554 Personal history of malignant neoplasm of ureter: Secondary | ICD-10-CM

## 2017-12-28 DIAGNOSIS — R0602 Shortness of breath: Secondary | ICD-10-CM

## 2017-12-28 DIAGNOSIS — J111 Influenza due to unidentified influenza virus with other respiratory manifestations: Secondary | ICD-10-CM | POA: Diagnosis present

## 2017-12-28 DIAGNOSIS — H42 Glaucoma in diseases classified elsewhere: Secondary | ICD-10-CM | POA: Diagnosis not present

## 2017-12-28 DIAGNOSIS — N183 Chronic kidney disease, stage 3 unspecified: Secondary | ICD-10-CM | POA: Diagnosis present

## 2017-12-28 DIAGNOSIS — Z8551 Personal history of malignant neoplasm of bladder: Secondary | ICD-10-CM

## 2017-12-28 DIAGNOSIS — Z950 Presence of cardiac pacemaker: Secondary | ICD-10-CM | POA: Diagnosis not present

## 2017-12-28 DIAGNOSIS — I130 Hypertensive heart and chronic kidney disease with heart failure and stage 1 through stage 4 chronic kidney disease, or unspecified chronic kidney disease: Secondary | ICD-10-CM | POA: Diagnosis present

## 2017-12-28 DIAGNOSIS — J069 Acute upper respiratory infection, unspecified: Secondary | ICD-10-CM | POA: Diagnosis not present

## 2017-12-28 DIAGNOSIS — K219 Gastro-esophageal reflux disease without esophagitis: Secondary | ICD-10-CM | POA: Diagnosis present

## 2017-12-28 DIAGNOSIS — M19041 Primary osteoarthritis, right hand: Secondary | ICD-10-CM | POA: Diagnosis present

## 2017-12-28 DIAGNOSIS — I481 Persistent atrial fibrillation: Secondary | ICD-10-CM | POA: Diagnosis present

## 2017-12-28 DIAGNOSIS — I2510 Atherosclerotic heart disease of native coronary artery without angina pectoris: Secondary | ICD-10-CM | POA: Diagnosis present

## 2017-12-28 DIAGNOSIS — M19042 Primary osteoarthritis, left hand: Secondary | ICD-10-CM | POA: Diagnosis present

## 2017-12-28 DIAGNOSIS — R05 Cough: Secondary | ICD-10-CM | POA: Diagnosis not present

## 2017-12-28 DIAGNOSIS — R609 Edema, unspecified: Secondary | ICD-10-CM | POA: Diagnosis not present

## 2017-12-28 DIAGNOSIS — L97909 Non-pressure chronic ulcer of unspecified part of unspecified lower leg with unspecified severity: Secondary | ICD-10-CM | POA: Diagnosis not present

## 2017-12-28 DIAGNOSIS — R0600 Dyspnea, unspecified: Secondary | ICD-10-CM | POA: Diagnosis not present

## 2017-12-28 DIAGNOSIS — E1142 Type 2 diabetes mellitus with diabetic polyneuropathy: Secondary | ICD-10-CM | POA: Diagnosis present

## 2017-12-28 DIAGNOSIS — J449 Chronic obstructive pulmonary disease, unspecified: Secondary | ICD-10-CM | POA: Diagnosis present

## 2017-12-28 DIAGNOSIS — G4733 Obstructive sleep apnea (adult) (pediatric): Secondary | ICD-10-CM | POA: Diagnosis present

## 2017-12-28 DIAGNOSIS — Z9049 Acquired absence of other specified parts of digestive tract: Secondary | ICD-10-CM

## 2017-12-28 DIAGNOSIS — I4891 Unspecified atrial fibrillation: Secondary | ICD-10-CM | POA: Diagnosis not present

## 2017-12-28 DIAGNOSIS — Z87891 Personal history of nicotine dependence: Secondary | ICD-10-CM

## 2017-12-28 DIAGNOSIS — Z794 Long term (current) use of insulin: Secondary | ICD-10-CM | POA: Diagnosis not present

## 2017-12-28 DIAGNOSIS — J440 Chronic obstructive pulmonary disease with acute lower respiratory infection: Secondary | ICD-10-CM | POA: Diagnosis not present

## 2017-12-28 DIAGNOSIS — Z7901 Long term (current) use of anticoagulants: Secondary | ICD-10-CM

## 2017-12-28 DIAGNOSIS — Z87442 Personal history of urinary calculi: Secondary | ICD-10-CM | POA: Diagnosis not present

## 2017-12-28 DIAGNOSIS — Z800 Family history of malignant neoplasm of digestive organs: Secondary | ICD-10-CM

## 2017-12-28 DIAGNOSIS — H9190 Unspecified hearing loss, unspecified ear: Secondary | ICD-10-CM | POA: Diagnosis present

## 2017-12-28 DIAGNOSIS — Z8249 Family history of ischemic heart disease and other diseases of the circulatory system: Secondary | ICD-10-CM

## 2017-12-28 DIAGNOSIS — E039 Hypothyroidism, unspecified: Secondary | ICD-10-CM | POA: Diagnosis present

## 2017-12-28 DIAGNOSIS — Z6841 Body Mass Index (BMI) 40.0 and over, adult: Secondary | ICD-10-CM | POA: Diagnosis not present

## 2017-12-28 DIAGNOSIS — L97919 Non-pressure chronic ulcer of unspecified part of right lower leg with unspecified severity: Secondary | ICD-10-CM | POA: Diagnosis not present

## 2017-12-28 DIAGNOSIS — I5032 Chronic diastolic (congestive) heart failure: Secondary | ICD-10-CM | POA: Diagnosis not present

## 2017-12-28 DIAGNOSIS — E669 Obesity, unspecified: Secondary | ICD-10-CM

## 2017-12-28 DIAGNOSIS — H409 Unspecified glaucoma: Secondary | ICD-10-CM | POA: Diagnosis present

## 2017-12-28 DIAGNOSIS — R69 Illness, unspecified: Secondary | ICD-10-CM

## 2017-12-28 DIAGNOSIS — A419 Sepsis, unspecified organism: Secondary | ICD-10-CM | POA: Diagnosis present

## 2017-12-28 DIAGNOSIS — A409 Streptococcal sepsis, unspecified: Secondary | ICD-10-CM | POA: Diagnosis not present

## 2017-12-28 DIAGNOSIS — I480 Paroxysmal atrial fibrillation: Secondary | ICD-10-CM | POA: Diagnosis present

## 2017-12-28 DIAGNOSIS — E7800 Pure hypercholesterolemia, unspecified: Secondary | ICD-10-CM | POA: Diagnosis present

## 2017-12-28 DIAGNOSIS — Z79890 Hormone replacement therapy (postmenopausal): Secondary | ICD-10-CM

## 2017-12-28 DIAGNOSIS — E1122 Type 2 diabetes mellitus with diabetic chronic kidney disease: Secondary | ICD-10-CM | POA: Diagnosis present

## 2017-12-28 DIAGNOSIS — F419 Anxiety disorder, unspecified: Secondary | ICD-10-CM | POA: Diagnosis present

## 2017-12-28 DIAGNOSIS — A401 Sepsis due to streptococcus, group B: Secondary | ICD-10-CM | POA: Diagnosis not present

## 2017-12-28 DIAGNOSIS — F329 Major depressive disorder, single episode, unspecified: Secondary | ICD-10-CM | POA: Diagnosis present

## 2017-12-28 DIAGNOSIS — R069 Unspecified abnormalities of breathing: Secondary | ICD-10-CM | POA: Diagnosis not present

## 2017-12-28 DIAGNOSIS — E785 Hyperlipidemia, unspecified: Secondary | ICD-10-CM | POA: Diagnosis present

## 2017-12-28 DIAGNOSIS — I4510 Unspecified right bundle-branch block: Secondary | ICD-10-CM | POA: Diagnosis present

## 2017-12-28 DIAGNOSIS — L97929 Non-pressure chronic ulcer of unspecified part of left lower leg with unspecified severity: Secondary | ICD-10-CM | POA: Diagnosis not present

## 2017-12-28 DIAGNOSIS — B954 Other streptococcus as the cause of diseases classified elsewhere: Secondary | ICD-10-CM | POA: Diagnosis not present

## 2017-12-28 DIAGNOSIS — N499 Inflammatory disorder of unspecified male genital organ: Secondary | ICD-10-CM | POA: Diagnosis not present

## 2017-12-28 DIAGNOSIS — Z66 Do not resuscitate: Secondary | ICD-10-CM | POA: Diagnosis present

## 2017-12-28 DIAGNOSIS — D649 Anemia, unspecified: Secondary | ICD-10-CM | POA: Diagnosis present

## 2017-12-28 DIAGNOSIS — Z888 Allergy status to other drugs, medicaments and biological substances status: Secondary | ICD-10-CM

## 2017-12-28 DIAGNOSIS — E872 Acidosis: Secondary | ICD-10-CM | POA: Diagnosis not present

## 2017-12-28 DIAGNOSIS — Z7951 Long term (current) use of inhaled steroids: Secondary | ICD-10-CM

## 2017-12-28 DIAGNOSIS — I495 Sick sinus syndrome: Secondary | ICD-10-CM | POA: Diagnosis not present

## 2017-12-28 DIAGNOSIS — E1139 Type 2 diabetes mellitus with other diabetic ophthalmic complication: Secondary | ICD-10-CM | POA: Diagnosis not present

## 2017-12-28 DIAGNOSIS — R6520 Severe sepsis without septic shock: Secondary | ICD-10-CM

## 2017-12-28 LAB — INFLUENZA PANEL BY PCR (TYPE A & B)
INFLAPCR: NEGATIVE
Influenza B By PCR: NEGATIVE

## 2017-12-28 LAB — CBC WITH DIFFERENTIAL/PLATELET
BASOS ABS: 0 10*3/uL (ref 0.0–0.1)
Basophils Relative: 0 %
Eosinophils Absolute: 0 10*3/uL (ref 0.0–0.7)
Eosinophils Relative: 0 %
HEMATOCRIT: 36.7 % — AB (ref 39.0–52.0)
Hemoglobin: 11.3 g/dL — ABNORMAL LOW (ref 13.0–17.0)
LYMPHS PCT: 11 %
Lymphs Abs: 1.7 10*3/uL (ref 0.7–4.0)
MCH: 28 pg (ref 26.0–34.0)
MCHC: 30.8 g/dL (ref 30.0–36.0)
MCV: 91.1 fL (ref 78.0–100.0)
MONO ABS: 1.8 10*3/uL — AB (ref 0.1–1.0)
MONOS PCT: 11 %
NEUTROS ABS: 12.4 10*3/uL — AB (ref 1.7–7.7)
Neutrophils Relative %: 78 %
Platelets: 248 10*3/uL (ref 150–400)
RBC: 4.03 MIL/uL — ABNORMAL LOW (ref 4.22–5.81)
RDW: 17.1 % — AB (ref 11.5–15.5)
WBC: 16 10*3/uL — ABNORMAL HIGH (ref 4.0–10.5)

## 2017-12-28 LAB — RESPIRATORY PANEL BY PCR
Adenovirus: NOT DETECTED
Bordetella pertussis: NOT DETECTED
Chlamydophila pneumoniae: NOT DETECTED
Coronavirus 229E: DETECTED — AB
Coronavirus HKU1: NOT DETECTED
Coronavirus NL63: NOT DETECTED
Coronavirus OC43: NOT DETECTED
INFLUENZA A-RVPPCR: NOT DETECTED
INFLUENZA B-RVPPCR: NOT DETECTED
METAPNEUMOVIRUS-RVPPCR: NOT DETECTED
Mycoplasma pneumoniae: NOT DETECTED
PARAINFLUENZA VIRUS 2-RVPPCR: NOT DETECTED
PARAINFLUENZA VIRUS 3-RVPPCR: NOT DETECTED
PARAINFLUENZA VIRUS 4-RVPPCR: NOT DETECTED
Parainfluenza Virus 1: NOT DETECTED
RESPIRATORY SYNCYTIAL VIRUS-RVPPCR: NOT DETECTED
RHINOVIRUS / ENTEROVIRUS - RVPPCR: NOT DETECTED

## 2017-12-28 LAB — COMPREHENSIVE METABOLIC PANEL WITH GFR
ALT: 29 U/L (ref 17–63)
AST: 44 U/L — ABNORMAL HIGH (ref 15–41)
Albumin: 3.4 g/dL — ABNORMAL LOW (ref 3.5–5.0)
Alkaline Phosphatase: 102 U/L (ref 38–126)
Anion gap: 17 — ABNORMAL HIGH (ref 5–15)
BUN: 29 mg/dL — ABNORMAL HIGH (ref 6–20)
CO2: 21 mmol/L — ABNORMAL LOW (ref 22–32)
Calcium: 9.2 mg/dL (ref 8.9–10.3)
Chloride: 102 mmol/L (ref 101–111)
Creatinine, Ser: 1.92 mg/dL — ABNORMAL HIGH (ref 0.61–1.24)
GFR calc Af Amer: 38 mL/min — ABNORMAL LOW
GFR calc non Af Amer: 33 mL/min — ABNORMAL LOW
Glucose, Bld: 127 mg/dL — ABNORMAL HIGH (ref 65–99)
Potassium: 3.9 mmol/L (ref 3.5–5.1)
Sodium: 140 mmol/L (ref 135–145)
Total Bilirubin: 0.7 mg/dL (ref 0.3–1.2)
Total Protein: 7.7 g/dL (ref 6.5–8.1)

## 2017-12-28 LAB — I-STAT VENOUS BLOOD GAS, ED
Acid-base deficit: 1 mmol/L (ref 0.0–2.0)
Bicarbonate: 22.8 mmol/L (ref 20.0–28.0)
O2 Saturation: 35 %
TCO2: 24 mmol/L (ref 22–32)
pCO2, Ven: 36.7 mmHg — ABNORMAL LOW (ref 44.0–60.0)
pH, Ven: 7.403 (ref 7.250–7.430)
pO2, Ven: 21 mmHg — CL (ref 32.0–45.0)

## 2017-12-28 LAB — I-STAT TROPONIN, ED: Troponin i, poc: 0.03 ng/mL (ref 0.00–0.08)

## 2017-12-28 LAB — I-STAT CG4 LACTIC ACID, ED
Lactic Acid, Venous: 4.58 mmol/L (ref 0.5–1.9)
Lactic Acid, Venous: 3.27 mmol/L (ref 0.5–1.9)

## 2017-12-28 LAB — CBG MONITORING, ED
GLUCOSE-CAPILLARY: 159 mg/dL — AB (ref 65–99)
Glucose-Capillary: 197 mg/dL — ABNORMAL HIGH (ref 65–99)

## 2017-12-28 LAB — BRAIN NATRIURETIC PEPTIDE: B Natriuretic Peptide: 767.4 pg/mL — ABNORMAL HIGH (ref 0.0–100.0)

## 2017-12-28 MED ORDER — RISAQUAD PO CAPS
1.0000 | ORAL_CAPSULE | Freq: Every day | ORAL | Status: DC
  Administered 2017-12-29 – 2018-01-02 (×5): 1 via ORAL
  Filled 2017-12-28 (×5): qty 1

## 2017-12-28 MED ORDER — GUAIFENESIN 200 MG PO TABS
400.0000 mg | ORAL_TABLET | Freq: Two times a day (BID) | ORAL | Status: DC | PRN
  Administered 2017-12-29: 400 mg via ORAL
  Filled 2017-12-28: qty 2

## 2017-12-28 MED ORDER — AZITHROMYCIN 500 MG PO TABS
500.0000 mg | ORAL_TABLET | ORAL | Status: DC
  Filled 2017-12-28: qty 1

## 2017-12-28 MED ORDER — ALBUTEROL SULFATE HFA 108 (90 BASE) MCG/ACT IN AERS: 2.0000 | INHALATION_SPRAY | Freq: Four times a day (QID) | RESPIRATORY_TRACT | Status: DC | PRN

## 2017-12-28 MED ORDER — TRAVOPROST (BAK FREE) 0.004 % OP SOLN
1.0000 [drp] | Freq: Every day | OPHTHALMIC | Status: DC
  Administered 2017-12-30 – 2018-01-01 (×4): 1 [drp] via OPHTHALMIC
  Filled 2017-12-28: qty 2.5

## 2017-12-28 MED ORDER — SODIUM CHLORIDE 0.9 % IV BOLUS (SEPSIS): 1000.0000 mL | Freq: Once | INTRAVENOUS | Status: DC

## 2017-12-28 MED ORDER — METHYLPREDNISOLONE SODIUM SUCC 125 MG IJ SOLR
125.0000 mg | Freq: Once | INTRAMUSCULAR | Status: AC
  Administered 2017-12-28: 125 mg via INTRAVENOUS
  Filled 2017-12-28: qty 2

## 2017-12-28 MED ORDER — DEXTROSE 5 % IV SOLN
500.0000 mg | Freq: Once | INTRAVENOUS | Status: AC
  Administered 2017-12-28: 500 mg via INTRAVENOUS
  Filled 2017-12-28: qty 500

## 2017-12-28 MED ORDER — VITAMIN D 1000 UNITS PO TABS
1000.0000 [IU] | ORAL_TABLET | Freq: Two times a day (BID) | ORAL | Status: DC
  Administered 2017-12-28 – 2018-01-02 (×10): 1000 [IU] via ORAL
  Filled 2017-12-28 (×10): qty 1

## 2017-12-28 MED ORDER — BRIMONIDINE TARTRATE 0.15 % OP SOLN: 1.0000 [drp] | Freq: Three times a day (TID) | OPHTHALMIC | Status: DC

## 2017-12-28 MED ORDER — TIMOLOL MALEATE (ONCE-DAILY) 0.5 % OP SOLN: 1.0000 [drp] | Freq: Two times a day (BID) | OPHTHALMIC | Status: DC

## 2017-12-28 MED ORDER — DORZOLAMIDE HCL 2 % OP SOLN: 1.0000 [drp] | Freq: Two times a day (BID) | OPHTHALMIC | Status: DC

## 2017-12-28 MED ORDER — ADULT MULTIVITAMIN W/MINERALS CH
1.0000 | ORAL_TABLET | Freq: Every day | ORAL | Status: DC
  Administered 2017-12-29 – 2018-01-02 (×5): 1 via ORAL
  Filled 2017-12-28 (×5): qty 1

## 2017-12-28 MED ORDER — INSULIN REGULAR HUMAN 100 UNIT/ML IJ SOLN
300.0000 [IU] | Freq: Every day | INTRAMUSCULAR | Status: DC
  Administered 2017-12-29: 300 [IU] via SUBCUTANEOUS
  Administered 2018-01-02: 50 [IU] via SUBCUTANEOUS
  Filled 2017-12-28: qty 3
  Filled 2017-12-28 (×2): qty 1
  Filled 2017-12-28 (×3): qty 3
  Filled 2017-12-28: qty 1
  Filled 2017-12-28 (×6): qty 3

## 2017-12-28 MED ORDER — ALBUTEROL SULFATE (2.5 MG/3ML) 0.083% IN NEBU
5.0000 mg | INHALATION_SOLUTION | Freq: Once | RESPIRATORY_TRACT | Status: AC
  Administered 2017-12-28: 5 mg via RESPIRATORY_TRACT
  Filled 2017-12-28: qty 6

## 2017-12-28 MED ORDER — DEXTROMETHORPHAN POLISTIREX ER 30 MG/5ML PO SUER
30.0000 mg | Freq: Two times a day (BID) | ORAL | Status: DC | PRN
  Filled 2017-12-28: qty 5

## 2017-12-28 MED ORDER — LACTATED RINGERS IV BOLUS (SEPSIS)
1000.0000 mL | Freq: Once | INTRAVENOUS | Status: AC
  Administered 2017-12-28: 1000 mL via INTRAVENOUS

## 2017-12-28 MED ORDER — FUROSEMIDE 10 MG/ML IJ SOLN: 40.0000 mg | Freq: Once | INTRAMUSCULAR | Status: DC

## 2017-12-28 MED ORDER — INSULIN REGULAR HUMAN 100 UNIT/ML IJ SOLN
240.0000 [IU] | Freq: Every day | INTRAMUSCULAR | Status: DC
  Administered 2017-12-29: 150 [IU] via SUBCUTANEOUS
  Filled 2017-12-28 (×9): qty 2.4

## 2017-12-28 MED ORDER — NETARSUDIL DIMESYLATE 0.02 % OP SOLN: 1.0000 [drp] | Freq: Every day | OPHTHALMIC | Status: DC

## 2017-12-28 MED ORDER — TRAMADOL HCL 50 MG PO TABS
100.0000 mg | ORAL_TABLET | Freq: Two times a day (BID) | ORAL | Status: DC | PRN
  Administered 2017-12-31 – 2018-01-01 (×2): 100 mg via ORAL
  Filled 2017-12-28 (×2): qty 2

## 2017-12-28 MED ORDER — INSULIN REGULAR HUMAN 100 UNIT/ML IJ SOLN
190.0000 [IU] | Freq: Every day | INTRAMUSCULAR | Status: DC
  Filled 2017-12-28 (×9): qty 1.9

## 2017-12-28 MED ORDER — LEVOTHYROXINE SODIUM 75 MCG PO TABS
75.0000 ug | ORAL_TABLET | Freq: Every day | ORAL | Status: DC
  Administered 2017-12-29 – 2018-01-02 (×5): 75 ug via ORAL
  Filled 2017-12-28 (×6): qty 1

## 2017-12-28 MED ORDER — BRIMONIDINE TARTRATE 0.15 % OP SOLN
1.0000 [drp] | Freq: Three times a day (TID) | OPHTHALMIC | Status: DC
  Administered 2017-12-29 – 2018-01-02 (×14): 1 [drp] via OPHTHALMIC
  Filled 2017-12-28: qty 5

## 2017-12-28 MED ORDER — ROSUVASTATIN CALCIUM 10 MG PO TABS
20.0000 mg | ORAL_TABLET | Freq: Every evening | ORAL | Status: DC
  Administered 2017-12-28 – 2018-01-01 (×5): 20 mg via ORAL
  Filled 2017-12-28 (×3): qty 2
  Filled 2017-12-28: qty 1
  Filled 2017-12-28: qty 2

## 2017-12-28 MED ORDER — TIMOLOL MALEATE 0.5 % OP SOLN
1.0000 [drp] | Freq: Two times a day (BID) | OPHTHALMIC | Status: DC
  Administered 2017-12-29 – 2018-01-02 (×9): 1 [drp] via OPHTHALMIC
  Filled 2017-12-28: qty 5

## 2017-12-28 MED ORDER — INSULIN REGULAR HUMAN 100 UNIT/ML IJ SOLN
220.0000 [IU] | Freq: Every day | INTRAMUSCULAR | Status: DC
  Filled 2017-12-28 (×10): qty 2.2

## 2017-12-28 MED ORDER — ACETAMINOPHEN 325 MG PO TABS
650.0000 mg | ORAL_TABLET | Freq: Once | ORAL | Status: AC
  Administered 2017-12-28: 650 mg via ORAL
  Filled 2017-12-28: qty 2

## 2017-12-28 MED ORDER — DORZOLAMIDE HCL 2 % OP SOLN
1.0000 [drp] | Freq: Two times a day (BID) | OPHTHALMIC | Status: DC
  Administered 2017-12-29 – 2018-01-02 (×9): 1 [drp] via OPHTHALMIC
  Filled 2017-12-28: qty 10

## 2017-12-28 MED ORDER — DEXTROSE 5 % IV SOLN
1.0000 g | Freq: Once | INTRAVENOUS | Status: AC
  Administered 2017-12-28: 1 g via INTRAVENOUS
  Filled 2017-12-28: qty 10

## 2017-12-28 MED ORDER — AMIODARONE HCL 100 MG PO TABS
200.0000 mg | ORAL_TABLET | Freq: Every day | ORAL | Status: DC
  Administered 2017-12-29 – 2018-01-02 (×5): 200 mg via ORAL
  Filled 2017-12-28: qty 1
  Filled 2017-12-28 (×5): qty 2

## 2017-12-28 MED ORDER — OMEGA-3-ACID ETHYL ESTERS 1 G PO CAPS
1000.0000 mg | ORAL_CAPSULE | Freq: Every morning | ORAL | Status: DC
Start: 2017-12-29 — End: 2018-01-02
  Administered 2017-12-29 – 2018-01-02 (×5): 1000 mg via ORAL
  Filled 2017-12-28 (×5): qty 1

## 2017-12-28 MED ORDER — ALBUTEROL SULFATE (2.5 MG/3ML) 0.083% IN NEBU: 2.5000 mg | INHALATION_SOLUTION | RESPIRATORY_TRACT | Status: DC | PRN

## 2017-12-28 MED ORDER — IPRATROPIUM BROMIDE 0.02 % IN SOLN
0.5000 mg | Freq: Once | RESPIRATORY_TRACT | Status: AC
  Administered 2017-12-28: 0.5 mg via RESPIRATORY_TRACT
  Filled 2017-12-28: qty 2.5

## 2017-12-28 MED ORDER — CALCIUM CARBONATE ANTACID 500 MG PO CHEW
2.0000 | CHEWABLE_TABLET | Freq: Two times a day (BID) | ORAL | Status: DC | PRN
  Filled 2017-12-28: qty 2

## 2017-12-28 MED ORDER — APIXABAN 5 MG PO TABS
5.0000 mg | ORAL_TABLET | Freq: Two times a day (BID) | ORAL | Status: DC
  Administered 2017-12-28 – 2018-01-02 (×10): 5 mg via ORAL
  Filled 2017-12-28 (×10): qty 1

## 2017-12-28 MED ORDER — DOCUSATE SODIUM 100 MG PO CAPS
100.0000 mg | ORAL_CAPSULE | Freq: Every day | ORAL | Status: DC | PRN
  Administered 2017-12-30 – 2018-01-01 (×2): 100 mg via ORAL
  Filled 2017-12-28 (×2): qty 1

## 2017-12-28 MED ORDER — ACETAMINOPHEN 500 MG PO TABS: 500.0000 mg | ORAL_TABLET | Freq: Two times a day (BID) | ORAL | Status: DC | PRN

## 2017-12-28 MED ORDER — PANTOPRAZOLE SODIUM 40 MG PO TBEC
80.0000 mg | DELAYED_RELEASE_TABLET | Freq: Every day | ORAL | Status: DC
  Administered 2017-12-29 – 2018-01-02 (×5): 80 mg via ORAL
  Filled 2017-12-28 (×5): qty 2

## 2017-12-28 MED ORDER — MOMETASONE FURO-FORMOTEROL FUM 100-5 MCG/ACT IN AERO
2.0000 | INHALATION_SPRAY | Freq: Two times a day (BID) | RESPIRATORY_TRACT | Status: DC
  Administered 2017-12-28 – 2018-01-02 (×2): 2 via RESPIRATORY_TRACT
  Filled 2017-12-28 (×3): qty 8.8

## 2017-12-28 MED ORDER — NETARSUDIL DIMESYLATE 0.02 % OP SOLN
1.0000 [drp] | Freq: Every day | OPHTHALMIC | Status: DC
  Administered 2017-12-29 – 2018-01-01 (×4): 1 [drp] via OPHTHALMIC

## 2017-12-28 MED ORDER — DEXTROSE 5 % IV SOLN: 1.0000 g | INTRAVENOUS | Status: DC

## 2017-12-28 MED ORDER — SODIUM CHLORIDE 0.9 % IV SOLN
INTRAVENOUS | Status: DC
  Administered 2017-12-28 – 2017-12-29 (×2): via INTRAVENOUS

## 2017-12-28 MED ORDER — TRAVOPROST 0.004 % OP SOLN: 1.0000 [drp] | Freq: Every day | OPHTHALMIC | Status: DC

## 2017-12-28 MED ORDER — IPRATROPIUM-ALBUTEROL 0.5-2.5 (3) MG/3ML IN SOLN
3.0000 mL | RESPIRATORY_TRACT | Status: DC | PRN
  Administered 2017-12-29 – 2017-12-31 (×5): 3 mL via RESPIRATORY_TRACT
  Filled 2017-12-28 (×4): qty 3

## 2017-12-28 MED ORDER — INSULIN REGULAR HUMAN 100 UNIT/ML IJ SOLN
150.0000 [IU] | Freq: Every day | INTRAMUSCULAR | Status: DC
  Administered 2017-12-28: 100 [IU] via SUBCUTANEOUS
  Filled 2017-12-28 (×9): qty 1.5

## 2017-12-28 MED ORDER — INSULIN REGULAR HUMAN 100 UNIT/ML IJ SOLN
210.0000 [IU] | Freq: Every day | INTRAMUSCULAR | Status: DC
  Filled 2017-12-28 (×9): qty 2.1

## 2017-12-28 NOTE — Progress Notes (Signed)
*  PRELIMINARY RESULTS* Vascular Ultrasound Left lower extremity venous duplex has been completed.  Preliminary findings: Difficult exam due to patient body habitus, breathing treatment, position and pitting edema.  No evidence of deep vein thrombosis in the visualized veins. Negative for bakers cyst on the left.  Everrett Coombe 12/28/2017, 6:33 PM

## 2017-12-28 NOTE — ED Provider Notes (Signed)
[facility] EMERGENCY DEPARTMENT Provider Note   CSN: [id number] Arrival date & time: 12/28/17  1732     History   Chief Complaint Chief Complaint  Patient presents with  . COPD  . Shortness of Breath  . Groin Pain    HPI [name] is a 77 y.o. male.   Shortness of Breath  This is a recurrent problem. The average episode lasts 2 days. The problem has been gradually worsening. Associated symptoms include cough, leg pain (left leg/groin) and leg swelling. Pertinent negatives include no fever, no sore throat, no ear pain, no sputum production, no wheezing, no chest pain, no vomiting, no abdominal pain and no rash. He has tried nothing for the symptoms. Associated medical issues include COPD, heart failure and DVT.    Past Medical History:  Diagnosis Date  . Anemia    hx of  . Anxiety   . CAD (coronary artery disease)   . Cancer [facility]) 2014   bladder cancer AND RIGHT URETERAL CANCER  . COPD (chronic obstructive pulmonary disease) (HCC)    CPAP  . Depression   . Diabetes mellitus    TypeII  . Diastolic dysfunction with chronic heart failure ([place])   . Gait difficulty    slow gait"ambulates with walker"  . GERD (gastroesophageal reflux disease)   . 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 H/O Legionnaire's disease 2003  . History of blood clots    R groin  . History of kidney stones   . Hyperkalemia   . Hyperlipemia   . Hypertensive cardiovascular-renal disease   . Hypothyroidism   . Morbid obesity ([place])   . OSA on CPAP    using CPAP although it is hard for him to tolerate  . Osteoarthritis    fingers  . Pancreatitis   . Peripheral neuropathy   . Persistent atrial fibrillation ([place])   . Pneumonia 2003  . Renal insufficiency   . Shortness of breath dyspnea    with exertion  . Sick sinus syndrome ([place]) 06/2010   s/p PPM by [employer]  . Venous insufficiency     Patient Active Problem List   Diagnosis Date Noted  . Hypocalcemia  08/03/2017  . Sepsis ([place]) 07/29/2017  . COPD exacerbation ([place]) 07/28/2017  . Dyspnea 04/30/2016  . Diabetes mellitus type 2 in obese ([place]) 01/20/2016  . Paroxysmal atrial fibrillation (HCC)   . Wrist pain 01/30/2015  . De Quervain's tenosynovitis, right 01/03/2015  . Sick sinus syndrome ([place]) 07/02/2014  . Complete heart block ([place]) 07/02/2014  . Trigger finger, acquired 06/07/2014  . Shortness of breath 05/13/2014  . CKD (chronic kidney disease) 05/13/2014  . Encounter for therapeutic drug monitoring 02/15/2014  . Bladder cancer ([place]) 12/14/2013  . Thrombus 12/05/2013  . Ischemic leg 11/01/2013  . Low back pain 10/20/2013  . Depressive disorder, not elsewhere classified 10/20/2013  . Venous insufficiency 07/07/2013  . Postural dizziness 07/07/2013  . Encounter for long-term (current) use of other medications 06/27/2013  . Fatigue 06/23/2012  . UTI (lower urinary tract infection) 05/31/2012  . Pyuria 05/12/2012  . A-fib ([place]) 06/25/2011  . Screening for prostate cancer 05/06/2011  . Pre-operative cardiovascular examination 04/29/2011  . Epigastric abdominal pain 03/13/2011  . 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 B12 DEFICIENCY 12/18/2010  . PANCREATITIS 12/17/2010  . PSEUDOCYST, PANCREAS 12/17/2010  . DIASTOLIC HEART FAILURE, CHRONIC 04/16/2010  . CHEST PAIN, PLEURITIC 12/26/2009  . SINUSITIS, CHRONIC  12/11/2009  . Obstructive sleep apnea 11/08/2009  . Disorder resulting from impaired renal function 11/06/2009  . OBESITY-MORBID (>100') 10/29/2009  . HYPOGONADISM 10/01/2009  . 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 HEARING LOSS 04/26/2009  . Cough 03/08/2009  . EDEMA 01/09/2009  . HYPERKALEMIA 09/07/2008  . BRADYCARDIA 08/01/2008  . ANEMIA, IRON DEFICIENCY 06/22/2008  . LEUKOCYTOSIS 06/22/2008  . FATTY LIVER DISEASE 06/20/2008  . ACUTE CYSTITIS 06/20/2008  . Glaucoma 05/18/2008  . COPD 05/18/2008  . SEBACEOUS CYST 03/28/2008  . HYPERCHOLESTEROLEMIA  01/18/2008  . NAUSEA 12/07/2007  . 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 Coronary atherosclerosis 07/06/2007  . GERD 07/06/2007    Past Surgical History:  Procedure Laterality Date  . BELPHAROPTOSIS REPAIR Right    Glaucoma  . CARDIAC CATHETERIZATION     11-02-13  . CARDIOVERSION N/A 05/16/2014   Procedure: CARDIOVERSION;  Surgeon: Josue Hector, MD;  Location: [facility] ENDOSCOPY;  Service: Cardiovascular;  Laterality: N/A;  . CARDIOVERSION N/A 11/08/2014   Procedure: CARDIOVERSION;  Surgeon: Fay Records, MD;  Location: [place];  Service: Cardiovascular;  Laterality: N/A;  . CARDIOVERSION N/A 06/22/2015   Procedure: CARDIOVERSION;  Surgeon: Jerline Pain, MD;  Location: [place];  Service: Cardiovascular;  Laterality: N/A;  . CHOLECYSTECTOMY  2012  . CYSTOSCOPY W/ URETERAL STENT PLACEMENT Right 10/26/2013   Procedure: CYSTOSCOPY WITH RETROGRADE PYELOGRAM/URETERAL STENT PLACEMENT;  Surgeon: Alexis Frock, MD;  Location: [facility];  Service: Urology;  Laterality: Right;  . CYSTOSCOPY WITH URETEROSCOPY AND STENT PLACEMENT Right 01/11/2014   Procedure: CYSTOSCOPY WITH URETEROSCOPY ,RIGHT RETROGRADE AND STENT CHANGE AND LASER OF URETERAL TUMOR;  Surgeon: Alexis Frock, MD;  Location: [facility];  Service: Urology;  Laterality: Right;  . CYSTOSCOPY/RETROGRADE/URETEROSCOPY Right 08/23/2014   Procedure: CYSTOSCOPY/RETROGRADE/ DIAGNOSTIC URETEROSCOPY/RIGHT RENAL STONE EXTRACTION;  Surgeon: Alexis Frock, MD;  Location: [facility];  Service: Urology;  Laterality: Right;  . EMBOLECTOMY Left 11/02/2013   Procedure: LEFT FEMORAL EMBOLECTOMY, LEFT FEMORAL ARTERY ENDARTERECTOMY WITH DACRON PATCH ANGIOPLASTY.;  Surgeon: Mal Misty, MD;  Location: [place];  Service: Vascular;  Laterality: Left;  . EYE SURGERY Left    cataract  . GROIN DEBRIDEMENT Right 05/08/2015   Procedure: REMOVAL OF RIGHT GROIN MASS;  Surgeon: Angelia Mould, MD;  Location: [place];  Service: Vascular;  Laterality:  Right;  . HOLMIUM LASER APPLICATION Right 01/14/8340   Procedure: HOLMIUM LASER APPLICATION;  Surgeon: Alexis Frock, MD;  Location: [facility];  Service: Urology;  Laterality: Right;  . INSERT / REPLACE / REMOVE PACEMAKER  2011  . NASAL SEPTUM SURGERY  1967  . PACEMAKER PLACEMENT  06/2010   [facility] Accent RF DR, Model [id number] ( Serial number [id number])  . TEE WITHOUT CARDIOVERSION N/A 05/16/2014   Procedure: TRANSESOPHAGEAL ECHOCARDIOGRAM (TEE);  Surgeon: Josue Hector, MD;  Location: [facility] ENDOSCOPY;  Service: Cardiovascular;  Laterality: N/A;  . thromboembolectomy and four compartment fasciotomy Right 2009   leg  . TRANSURETHRAL RESECTION OF BLADDER TUMOR WITH GYRUS (TURBT-GYRUS) N/A 10/26/2013   Procedure: TRANSURETHRAL RESECTION OF BLADDER TUMOR WITH GYRUS (TURBT-GYRUS);  Surgeon: Alexis Frock, MD;  Location: [facility];  Service: Urology;  Laterality: N/A;  . TRANSURETHRAL RESECTION OF BLADDER TUMOR WITH GYRUS (TURBT-GYRUS) N/A 01/11/2014   Procedure: TRANSURETHRAL RESECTION OF BLADDER TUMOR WITH GYRUS (TURBT-GYRUS);  Surgeon: Alexis Frock, MD;  Location: [facility];  Service: Urology;  Laterality: N/A;  . WISDOM TOOTH EXTRACTION         Home Medications    Prior to Admission medications  Medication Sig Start Date End Date Taking? Authorizing Provider  acetaminophen (TYLENOL) 500 MG tablet Take 500-1,000 mg by mouth 2 (two) times daily as needed for moderate pain.     [provider]  albuterol (PROVENTIL) (2.5 MG/3ML) 0.083% nebulizer solution Take 3 mLs (2.5 mg total) by nebulization every 4 (four) hours as needed for wheezing or shortness of breath. 07/31/17   Cristal Ford, DO  albuterol (VENTOLIN HFA) 108 (90 Base) MCG/ACT inhaler Inhale 2 puffs into the lungs every 6 (six) hours as needed for wheezing or shortness of breath. 11/23/17   Parrett, Fonnie Mu, NP  ALPRAZolam (XANAX) 0.25 MG tablet TAKE 1 TABLET BY MOUTH 3 TIMES A DAY AS NEEDED FOR ANXIETY OR SLEEP 04/16/17    Renato Shin, MD  amiodarone (PACERONE) 200 MG tablet TAKE 1 TABLET DAILY 11/27/17   Burtis Junes, NP  apixaban (ELIQUIS) 5 MG TABS tablet Take 1 tablet (5 mg total) by mouth 2 (two) times daily. 12/22/16   Burtis Junes, NP  Ascorbic Acid (VITAMIN C) 500 MG tablet Take 500 mg by mouth daily.     [provider]  brimonidine (ALPHAGAN P) 0.1 % SOLN Place 1 drop into the left eye 3 (three) times daily.     [provider]  budesonide-formoterol (SYMBICORT) 80-4.5 MCG/ACT inhaler Inhale 2 puffs into the lungs 2 (two) times daily. 11/23/17   Parrett, Fonnie Mu, NP  calcium carbonate (TUMS - DOSED IN MG ELEMENTAL CALCIUM) 500 MG chewable tablet Chew 2 tablets by mouth 2 (two) times daily as needed for indigestion or heartburn.    [provider]  cholecalciferol (VITAMIN D) 1000 UNITS tablet Take 1,000 Units by mouth 2 (two) times daily.     [provider]  dextromethorphan (DELSYM) 30 MG/5ML liquid Take 90 mg by mouth 2 (two) times daily as needed for cough.    [provider]  docusate sodium (COLACE) 100 MG capsule Take 100 mg by mouth daily as needed for mild constipation.    [provider]  dorzolamide (TRUSOPT) 2 % ophthalmic solution Place 1 drop into both eyes 2 (two) times daily.    [provider]  fluticasone (FLONASE) 50 MCG/ACT nasal spray USE 2 SPRAYS IN EACN NOSTRIL DAILY 12/23/16   Renato Shin, MD  furosemide (LASIX) 40 MG tablet Take 60 mg in the AM and 20 mg in the early afternoon - total dose of 80 mg a day 10/26/17   Burtis Junes, NP  guaifenesin (HUMIBID E) 400 MG TABS tablet Take 400 mg by mouth 2 (two) times daily as needed (for congestion).     [provider]  insulin regular (NOVOLIN R) 100 units/mL injection 4 times a day (just before each meal), 300 units with breakfast, 210 units with lunch, 220 units  at supper, and 190 units with bedtime snack. 11/11/17   Renato Shin, MD    ipratropium-albuterol (DUONEB) 0.5-2.5 (3) MG/3ML SOLN Take 3 mLs by nebulization every 4 (four) hours as needed. 07/31/17   Mikhail, Velta Addison, DO  Lactobacillus (ACIDOPHILUS) CAPS capsule Take 1 capsule by mouth daily.    [provider]  levothyroxine (SYNTHROID, LEVOTHROID) 75 MCG tablet Take 1 tablet (75 mcg total) by mouth daily before breakfast. 12/23/16   Renato Shin, MD  metoprolol tartrate (LOPRESSOR) 25 MG tablet Take 1 tablet (25 mg total) by mouth 3 (three) times daily. 12/22/16   Burtis Junes, NP  Multiple Vitamin (MULTIVITAMIN WITH MINERALS) TABS tablet Take 1  tablet by mouth daily.    [provider]  Netarsudil Dimesylate (RHOPRESSA) 0.02 % SOLN Place 1 drop into the left eye daily.    [provider]  Omega-3 Fatty Acids (FISH OIL) 1000 MG CAPS Take 1,000 mg by mouth every morning.     [provider]  omeprazole (PRILOSEC) 40 MG capsule TAKE 1 CAPSULE DAILY 08/12/17   Renato Shin, MD  rosuvastatin (CRESTOR) 40 MG tablet Take 20 mg by mouth every evening.    [provider]  Timolol Maleate 0.5 % (DAILY) SOLN Place 1 drop into both eyes 2 (two) times daily.    [provider]  traMADol (ULTRAM) 50 MG tablet TAKE 2 TABLETS BY MOUTH TWICE A DAY AS NEEDED FOR PAIN 10/12/17   Renato Shin, MD  travoprost, benzalkonium, (TRAVATAN) 0.004 % ophthalmic solution Place 1 drop into the left eye at bedtime.    [provider]    Family History Family History  Problem Relation Age of Onset  . Liver cancer Mother        deceased age 44  . Cancer Mother        liver cancer  . Heart attack Father   . Colon cancer Neg Hx     Social History Social History   Tobacco Use  . Smoking status: Former Smoker    Packs/day: 2.00    Years: 50.00    Pack years: 100.00    Types: Cigarettes    Last attempt to quit: 12/08/2006    Years since quitting: 11.0  . Smokeless tobacco: Never Used  Substance Use Topics  . Alcohol use: No     Alcohol/week: 0.0 oz    Comment: quit 3 years ago  . Drug use: No     Allergies   Actos [pioglitazone] and Timolol   Review of Systems Review of Systems  Constitutional: Negative for chills and fever.  HENT: Negative for ear pain and sore throat.   Eyes: Negative for pain and visual disturbance.  Respiratory: Positive for cough and shortness of breath. Negative for sputum production and wheezing.   Cardiovascular: Positive for leg swelling. Negative for chest pain and palpitations.  Gastrointestinal: Negative for abdominal pain and vomiting.  Genitourinary: Negative for dysuria and hematuria.  Musculoskeletal: Negative for arthralgias and back pain.  Skin: Negative for color change and rash.  Neurological: Negative for seizures and syncope.  All other systems reviewed and are negative.    Physical Exam Updated Vital Signs BP (!) 111/50   Resp (!) 23   SpO2 100%   Physical Exam  Constitutional: He is oriented to person, place, and time. He appears well-developed and well-nourished.  HENT:  Head: Normocephalic and atraumatic.  Eyes: Conjunctivae and EOM are normal. Pupils are equal, round, and reactive to light.  Neck: Normal range of motion. Neck supple.  Cardiovascular: Normal rate and regular rhythm.  No murmur heard. Pulmonary/Chest: Effort normal and breath sounds normal. No respiratory distress.  Abdominal: Soft. There is no tenderness.  Musculoskeletal: He exhibits edema (2+ BLE).  Neurological: He is alert and oriented to person, place, and time.  Skin: Skin is warm and dry.  Psychiatric: He has a normal mood and affect.  Nursing note and vitals reviewed.    ED Treatments / Results  Labs (all labs ordered are listed, but only abnormal results are displayed) Labs Reviewed - No data to display  EKG  EKG Interpretation None       Radiology No results found.  Procedures Procedures (including critical care time)  Medications Ordered in  ED Medications - No data to display   Initial Impression / Assessment and Plan / ED Course  I have reviewed the triage vital signs and the nursing notes.  Pertinent labs & imaging results that were available during my care of the patient were reviewed by me and considered in my medical decision making (see chart for details).     Mr. Herms is a 77 year old male with past medical history significant for COPD, CHF, diabetes, blood clot, hypothyroidism, morbid obesity who presents for shortness of breath.  Patient has had 2 days of worsening cough and pain to his left groin.  EKG obtained and demonstrates paced rhythm with RBBB.  Doppler ultrasound negative for DVT.  Patient is febrile.  Labs significant for elevated lactic acid, leukocytosis, mild anemia, elevated BNP.  Sepsis treatment begun for presumed pulmonary source.  Ceftriaxone and azithromycin given. Fluids cautiously given as patient has CHF and elevated BNP.  Chest XR obtained, personally reviewed by me, demonstrates stable cardiomegaly with increased lung markings consistent with structural disease.  Patient is admitted to hospitalist.  Final Clinical Impressions(s) / ED Diagnoses   Final diagnoses:  Community acquired pneumonia, unspecified laterality  Sepsis, due to unspecified organism [facility])    ED Discharge Orders    None       Elveria Rising, MD 12/28/17 2119    Drenda Freeze, MD 12/29/17 [phone]

## 2017-12-28 NOTE — Progress Notes (Signed)
Patient is ordered bipap. MD stated to "hold off" on bipap for now. Neb treatment given. Will continue to monitor patient's respiratory condition.

## 2017-12-28 NOTE — H&P (Signed)
History and Physical    DREYDEN ROHRMAN OXB:[id number] DOB: [birth year] DOA: 12/28/2017  PCP: Renato Shin, MD  Patient coming from: Home  I have personally briefly reviewed patient's old medical records in [place]  Chief Complaint: SOB  HPI: [name] is a 77 y.o. male with medical history significant of COPD, DM2 with CKD stage 3 currently requiring 910 units of insulin / day treatment by [name], glaucoma on 5 eye drops, SSS with pacer, h/o DVT, A.Fib on eliquis.  Patient presents to the ED with c/o cough, intermittent L leg / groin pain.  No wheezing, no sputum production, no CP, no vomiting, no abd pain, no sore throat.  Symptoms onset Friday, worsened over weekend.   ED Course: T 102.5.  Lactate 4.5.  CXR neg.  Started on rocephin / azithromycin empirically for CAP.   Review of Systems: As per HPI otherwise 10 point review of systems negative.   Past Medical History:  Diagnosis Date  . Anemia    hx of  . Anxiety   . CAD (coronary artery disease)   . Cancer [facility]) 2014   bladder cancer AND RIGHT URETERAL CANCER  . COPD (chronic obstructive pulmonary disease) (HCC)    CPAP  . Depression   . Diabetes mellitus    TypeII  . Diastolic dysfunction with chronic heart failure ([place])   . Gait difficulty    slow gait"ambulates with walker"  . GERD (gastroesophageal reflux disease)   . 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 H/O Legionnaire's disease 2003  . History of blood clots    R groin  . History of kidney stones   . Hyperkalemia   . Hyperlipemia   . Hypertensive cardiovascular-renal disease   . Hypothyroidism   . Morbid obesity ([place])   . OSA on CPAP    using CPAP although it is hard for him to tolerate  . Osteoarthritis    fingers  . Pancreatitis   . Peripheral neuropathy   . Persistent atrial fibrillation ([place])   . Pneumonia 2003  . Renal insufficiency   . Shortness of breath dyspnea    with exertion  . Sick sinus syndrome ([place])  06/2010   s/p PPM by [employer]  . Venous insufficiency     Past Surgical History:  Procedure Laterality Date  . BELPHAROPTOSIS REPAIR Right    Glaucoma  . CARDIAC CATHETERIZATION     11-02-13  . CARDIOVERSION N/A 05/16/2014   Procedure: CARDIOVERSION;  Surgeon: Josue Hector, MD;  Location: [facility] ENDOSCOPY;  Service: Cardiovascular;  Laterality: N/A;  . CARDIOVERSION N/A 11/08/2014   Procedure: CARDIOVERSION;  Surgeon: Fay Records, MD;  Location: [place];  Service: Cardiovascular;  Laterality: N/A;  . CARDIOVERSION N/A 06/22/2015   Procedure: CARDIOVERSION;  Surgeon: Jerline Pain, MD;  Location: [place];  Service: Cardiovascular;  Laterality: N/A;  . CHOLECYSTECTOMY  2012  . CYSTOSCOPY W/ URETERAL STENT PLACEMENT Right 10/26/2013   Procedure: CYSTOSCOPY WITH RETROGRADE PYELOGRAM/URETERAL STENT PLACEMENT;  Surgeon: Alexis Frock, MD;  Location: [facility] ORS;  Service: Urology;  Laterality: Right;  . CYSTOSCOPY WITH URETEROSCOPY AND STENT PLACEMENT Right 01/11/2014   Procedure: CYSTOSCOPY WITH URETEROSCOPY ,RIGHT RETROGRADE AND STENT CHANGE AND LASER OF URETERAL TUMOR;  Surgeon: Alexis Frock, MD;  Location: [facility];  Service: Urology;  Laterality: Right;  . CYSTOSCOPY/RETROGRADE/URETEROSCOPY Right 08/23/2014   Procedure: CYSTOSCOPY/RETROGRADE/ DIAGNOSTIC URETEROSCOPY/RIGHT RENAL STONE EXTRACTION;  Surgeon: Alexis Frock, MD;  Location: [facility];  Service: Urology;  Laterality: Right;  . EMBOLECTOMY Left 11/02/2013   Procedure: LEFT FEMORAL EMBOLECTOMY, LEFT FEMORAL ARTERY ENDARTERECTOMY WITH DACRON PATCH ANGIOPLASTY.;  Surgeon: Mal Misty, MD;  Location: [place];  Service: Vascular;  Laterality: Left;  . EYE SURGERY Left    cataract  . GROIN DEBRIDEMENT Right 05/08/2015   Procedure: REMOVAL OF RIGHT GROIN MASS;  Surgeon: Angelia Mould, MD;  Location: [place];  Service: Vascular;  Laterality: Right;  . HOLMIUM LASER APPLICATION Right 05/09/8314   Procedure: HOLMIUM LASER APPLICATION;   Surgeon: Alexis Frock, MD;  Location: [facility];  Service: Urology;  Laterality: Right;  . INSERT / REPLACE / REMOVE PACEMAKER  2011  . NASAL SEPTUM SURGERY  1967  . PACEMAKER PLACEMENT  06/2010   [facility] Acc[employer] RF DR, Model [id number] ( Serial number [id number])  . TEE WITHOUT CARDIOVERSION N/A 05/16/2014   Procedure: TRANSESOPHAGEAL ECHOCARDIOGRAM (TEE);  Surgeon: Josue Hector, MD;  Location: [facility] ENDOSCOPY;  Service: Cardiovascular;  Laterality: N/A;  . thromboembolectomy and four compartment fasciotomy Right 2009   leg  . TRANSURETHRAL RESECTION OF BLADDER TUMOR WITH GYRUS (TURBT-GYRUS) N/A 10/26/2013   Procedure: TRANSURETHRAL RESECTION OF BLADDER TUMOR WITH GYRUS (TURBT-GYRUS);  Surgeon: Alexis Frock, MD;  [facility];  Service: Urology;  Laterality: N/A;  . TRANSURETHRAL RESECTION OF BLADDER TUMOR WITH GYRUS (TURBT-GYRUS) N/A 01/11/2014   Procedure: TRANSURETHRAL RESECTION OF BLADDER TUMOR WITH GYRUS (TURBT-GYRUS);  Surgeon: Alexis Frock, MD;  [facility];  Service: Urology;  Laterality: N/A;  . WISDOM TOOTH EXTRACTION       reports that he quit smoking about 11 years ago. His smoking use included cigarettes. He has a 100.00 pack-year smoking history. he has never used smokeless tobacco. He reports that he does not drink alcohol or use drugs.  Allergies  Allergen Reactions  . Actos [Pioglitazone] Swelling  . Timolol Other (See Comments)    Slow heart rate but tolerates Cosopt (dorzolamide-timolol)    Family History  Problem Relation Age of Onset  . Liver cancer Mother        deceased age 52  . Cancer Mother        liver cancer  . Heart attack Father   . Colon cancer Neg Hx      Prior to Admission medications   Medication Sig Start Date End Date Taking? Authorizing Provider  acetaminophen (TYLENOL) 500 MG tablet Take 500-1,000 mg by mouth 2 (two) times daily as needed for moderate pain.     [provider]  albuterol (PROVENTIL) (2.5 MG/3ML) 0.083%  nebulizer solution Take 3 mLs (2.5 mg total) by nebulization every 4 (four) hours as needed for wheezing or shortness of breath. 07/31/17   Cristal Ford, DO  albuterol (VENTOLIN HFA) 108 (90 Base) MCG/ACT inhaler Inhale 2 puffs into the lungs every 6 (six) hours as needed for wheezing or shortness of breath. 11/23/17   Parrett, Fonnie Mu, NP  ALPRAZolam (XANAX) 0.25 MG tablet TAKE 1 TABLET BY MOUTH 3 TIMES A DAY AS NEEDED FOR ANXIETY OR SLEEP 04/16/17   Renato Shin, MD  amiodarone (PACERONE) 200 MG tablet TAKE 1 TABLET DAILY 11/27/17   Burtis Junes, NP  apixaban (ELIQUIS) 5 MG TABS tablet Take 1 tablet (5 mg total) by mouth 2 (two) times daily. 12/22/16   Burtis Junes, NP  Ascorbic Acid (VITAMIN C) 500 MG tablet Take 500 mg by mouth daily.     [provider]  brimonidine (ALPHAGAN P) 0.1 % SOLN Place 1 drop into the left eye 3 (three) times daily.     [provider]  budesonide-formoterol (SYMBICORT) 80-4.5 MCG/ACT inhaler Inhale 2 puffs into the lungs 2 (two) times daily. 11/23/17   Parrett, Fonnie Mu, NP  calcium carbonate (TUMS - DOSED IN MG ELEMENTAL CALCIUM) 500 MG chewable tablet Chew 2 tablets by mouth 2 (two) times daily as needed for indigestion or heartburn.    [provider]  cholecalciferol (VITAMIN D) 1000 UNITS tablet Take 1,000 Units by mouth 2 (two) times daily.     [provider]  dextromethorphan (DELSYM) 30 MG/5ML liquid Take 90 mg by mouth 2 (two) times daily as needed for cough.    [provider]  docusate sodium (COLACE) 100 MG capsule Take 100 mg by mouth daily as needed for mild constipation.    [provider]  dorzolamide (TRUSOPT) 2 % ophthalmic solution Place 1 drop into both eyes 2 (two) times daily.    [provider]  fluticasone (FLONASE) 50 MCG/ACT nasal spray USE 2 SPRAYS IN EACN NOSTRIL DAILY 12/23/16   Renato Shin, MD  furosemide (LASIX) 40 MG tablet Take 60 mg in the AM and 20 mg in the  early afternoon - total dose of 80 mg a day 10/26/17   Burtis Junes, NP  guaifenesin (HUMIBID E) 400 MG TABS tablet Take 400 mg by mouth 2 (two) times daily as needed (for congestion).     [provider]  insulin regular (NOVOLIN R) 100 units/mL injection 4 times a day (just before each meal), 300 units with breakfast, 210 units with lunch, 220 units  at supper, and 190 units with bedtime snack. 11/11/17   Renato Shin, MD  ipratropium-albuterol (DUONEB) 0.5-2.5 (3) MG/3ML SOLN Take 3 mLs by nebulization every 4 (four) hours as needed. 07/31/17   Mikhail, Velta Addison, DO  Lactobacillus (ACIDOPHILUS) CAPS capsule Take 1 capsule by mouth daily.    [provider]  levothyroxine (SYNTHROID, LEVOTHROID) 75 MCG tablet Take 1 tablet (75 mcg total) by mouth daily before breakfast. 12/23/16   Renato Shin, MD  metoprolol tartrate (LOPRESSOR) 25 MG tablet Take 1 tablet (25 mg total) by mouth 3 (three) times daily. 12/22/16   Burtis Junes, NP  Multiple Vitamin (MULTIVITAMIN WITH MINERALS) TABS tablet Take 1 tablet by mouth daily.    [provider]  Netarsudil Dimesylate (RHOPRESSA) 0.02 % SOLN Place 1 drop into the left eye daily.    [provider]  Omega-3 Fatty Acids (FISH OIL) 1000 MG CAPS Take 1,000 mg by mouth every morning.     [provider]  omeprazole (PRILOSEC) 40 MG capsule TAKE 1 CAPSULE DAILY 08/12/17   Renato Shin, MD  rosuvastatin (CRESTOR) 40 MG tablet Take 20 mg by mouth every evening.    [provider]  Timolol Maleate 0.5 % (DAILY) SOLN Place 1 drop into both eyes 2 (two) times daily.    [provider]  traMADol (ULTRAM) 50 MG tablet TAKE 2 TABLETS BY MOUTH TWICE A DAY AS NEEDED FOR PAIN 10/12/17   Renato Shin, MD  travoprost, benzalkonium, (TRAVATAN) 0.004 % ophthalmic solution Place 1 drop into the left eye at bedtime.    [provider]    Physical Exam: Vitals:   12/28/17 1830 12/28/17 1856 12/28/17 1900  12/28/17 1930  BP: (!) 110/58   (!) 114/49  Pulse: 73 72 70 76  Resp: (!) 23 (!) 27 (!) 24 Marland Kitchen)  23  Temp:      TempSrc:      SpO2: 100% 100% 100% 100%    Constitutional: NAD, calm, comfortable Eyes: PERRL, lids and conjunctivae normal ENMT: Mucous membranes are moist. Posterior pharynx clear of any exudate or lesions.Normal dentition.  Neck: normal, supple, no masses, no thyromegaly Respiratory: clear to auscultation bilaterally, no wheezing, no crackles. Normal respiratory effort. No accessory muscle use.  Cardiovascular: Regular rate and rhythm, no murmurs / rubs / gallops. No extremity edema. 2+ pedal pulses. No carotid bruits.  Abdomen: no tenderness, no masses palpated. No hepatosplenomegaly. Bowel sounds positive.  Musculoskeletal: no clubbing / cyanosis. No joint deformity upper and lower extremities. Good ROM, no contractures. Normal muscle tone.  Skin: no rashes, lesions, ulcers. No induration Neurologic: CN 2-12 grossly intact. Sensation intact, DTR normal. Strength 5/5 in all 4.  Psychiatric: Normal judgment and insight. Alert and oriented x 3. Normal mood.    Labs on Admission: I have personally reviewed following labs and imaging studies  CBC: Recent Labs  Lab 12/28/17 1751  WBC 16.0*  NEUTROABS 12.4*  HGB 11.3*  HCT 36.7*  MCV 91.1  PLT 431   Basic Metabolic Panel: Recent Labs  Lab 12/28/17 1751  NA 140  K 3.9  CL 102  CO2 21*  GLUCOSE 127*  BUN 29*  CREATININE 1.92*  CALCIUM 9.2   GFR: CrCl cannot be calculated (Unknown ideal weight.). Liver Function Tests: Recent Labs  Lab 12/28/17 1751  AST 44*  ALT 29  ALKPHOS 102  BILITOT 0.7  PROT 7.7  ALBUMIN 3.4*   No results for input(s): LIPASE, AMYLASE in the last 168 hours. No results for input(s): AMMONIA in the last 168 hours. Coagulation Profile: No results for input(s): INR, PROTIME in the last 168 hours. Cardiac Enzymes: No results for input(s): CKTOTAL, CKMB, CKMBINDEX, TROPONINI in the  last 168 hours. BNP (last 3 results) Recent Labs    10/26/17 1425  PROBNP 1,148*   HbA1C: No results for input(s): HGBA1C in the last 72 hours. CBG: No results for input(s): GLUCAP in the last 168 hours. Lipid Profile: No results for input(s): CHOL, HDL, LDLCALC, TRIG, CHOLHDL, LDLDIRECT in the last 72 hours. Thyroid Function Tests: No results for input(s): TSH, T4TOTAL, FREET4, T3FREE, THYROIDAB in the last 72 hours. Anemia Panel: No results for input(s): VITAMINB12, FOLATE, FERRITIN, TIBC, IRON, RETICCTPCT in the last 72 hours. Urine analysis:    Component Value Date/Time   COLORURINE YELLOW 07/28/2017 1833   APPEARANCEUR CLEAR 07/28/2017 1833   LABSPEC 1.017 07/28/2017 1833   PHURINE 5.0 07/28/2017 1833   GLUCOSEU >=500 (A) 07/28/2017 1833   GLUCOSEU 100 09/30/2013 1535   HGBUR NEGATIVE 07/28/2017 1833   BILIRUBINUR NEGATIVE 07/28/2017 1833   KETONESUR NEGATIVE 07/28/2017 1833   PROTEINUR NEGATIVE 07/28/2017 1833   UROBILINOGEN 1.0 10/01/2013 1003   NITRITE NEGATIVE 07/28/2017 1833   LEUKOCYTESUR NEGATIVE 07/28/2017 1833    Radiological Exams on Admission: Dg Chest Portable 1 View  Result Date: 12/28/2017 CLINICAL DATA:  Dyspnea and groin pain. EXAM: PORTABLE CHEST 1 VIEW COMPARISON:  07/28/2017 FINDINGS: Stable cardiomegaly with aortic atherosclerosis. No pneumonic consolidation nor overt pulmonary edema. Chronic stable subpleural areas of mild interstitial lung markings may reflect areas of subpleural fibrosis and/or atelectasis. Right atrial and right ventricular leads with left-sided pacemaker apparatus appear stable. No acute osseous abnormality. IMPRESSION: Stable cardiomegaly with aortic atherosclerosis. Stable subpleural increased reticular lung markings may reflect areas of atelectasis, scarring and/or fibrosis bilaterally. Electronically Signed  By: Ashley Royalty M.D.   On: 12/28/2017 17:56    EKG: Independently reviewed.  Assessment/Plan Principal Problem:    Sepsis ([place]) Active Problems:   COPD (chronic obstructive pulmonary disease) (HCC)   CKD (chronic kidney disease)   Diabetes mellitus type 2 in obese ([place])   Influenza-like illness    1. Sepsis - with ILI possibly CAP though not called on CXR 1. PNA pathway 2. Rocephin / azithro 3. Flu PCR pending 4. Lactate trending down 4.5 to 3.2 now 5. IVF: 1L bolus and 125 cc/hr 6. Repeat CBC/BMP in AM 7. Holding Lasix, Holding metoprolol 2. COPD - 1. continue home nebs 2. Lung sounds clear in ED, will hold off on further steroids 3. CKD stage 3 - 1. Chronic and stable 2. Holding Lasix as above 3. Repeat BMP in AM 4. DM2 - 1. Continue home insulin dosing for now 2. diab coordinator consult in AM 3. CBG checks AC/HS 5. SSS and h/o A.Fib 1. Continue eliquis 2. Continue amiodarone 3. Holding metoprolol for now  DVT prophylaxis: Eliquis Code Status: DNR - per patient Family Communication: Family at bedside Disposition Plan: Home after admit Consults called: None Admission status: Admit to inpatient   [place], [place] [facility] Pager [phone]  If 7AM-7PM, please contact day team taking care of patient [email or web address] Password [facility]  12/28/2017, 8:08 PM

## 2017-12-28 NOTE — ED Triage Notes (Signed)
Per EMS- pt here for c.o. 2 days shortness of breath, hx of COPD, lung sounds clear initially. Pt appears in acute distress. Pt also c.o. Left groin pain that started 1 hour ago, pt states groin pain feels like a blood clot he had in the past.

## 2017-12-28 NOTE — ED Notes (Signed)
Dr. Darl Householder notified of elevated CG-4

## 2017-12-29 ENCOUNTER — Other Ambulatory Visit: Payer: Self-pay

## 2017-12-29 ENCOUNTER — Encounter (HOSPITAL_COMMUNITY): Payer: Self-pay

## 2017-12-29 LAB — BASIC METABOLIC PANEL
Anion gap: 13 (ref 5–15)
BUN: 34 mg/dL — ABNORMAL HIGH (ref 6–20)
CHLORIDE: 102 mmol/L (ref 101–111)
CO2: 21 mmol/L — ABNORMAL LOW (ref 22–32)
Calcium: 8.4 mg/dL — ABNORMAL LOW (ref 8.9–10.3)
Creatinine, Ser: 2 mg/dL — ABNORMAL HIGH (ref 0.61–1.24)
GFR calc non Af Amer: 31 mL/min — ABNORMAL LOW (ref >60)
GFR, EST AFRICAN AMERICAN: 36 mL/min — AB (ref >60)
Glucose, Bld: 224 mg/dL — ABNORMAL HIGH (ref 65–99)
POTASSIUM: 3.9 mmol/L (ref 3.5–5.1)
SODIUM: 136 mmol/L (ref 135–145)

## 2017-12-29 LAB — BLOOD CULTURE ID PANEL (REFLEXED)
Acinetobacter baumannii: NOT DETECTED
CANDIDA ALBICANS: NOT DETECTED
CANDIDA TROPICALIS: NOT DETECTED
Candida glabrata: NOT DETECTED
Candida krusei: NOT DETECTED
Candida parapsilosis: NOT DETECTED
ENTEROBACTERIACEAE SPECIES: NOT DETECTED
Enterobacter cloacae complex: NOT DETECTED
Enterococcus species: NOT DETECTED
Escherichia coli: NOT DETECTED
HAEMOPHILUS INFLUENZAE: NOT DETECTED
KLEBSIELLA PNEUMONIAE: NOT DETECTED
Klebsiella oxytoca: NOT DETECTED
Listeria monocytogenes: NOT DETECTED
NEISSERIA MENINGITIDIS: NOT DETECTED
PROTEUS SPECIES: NOT DETECTED
PSEUDOMONAS AERUGINOSA: NOT DETECTED
STAPHYLOCOCCUS SPECIES: NOT DETECTED
STREPTOCOCCUS AGALACTIAE: DETECTED — AB
STREPTOCOCCUS SPECIES: DETECTED — AB
Serratia marcescens: NOT DETECTED
Staphylococcus aureus (BCID): NOT DETECTED
Streptococcus pneumoniae: NOT DETECTED
Streptococcus pyogenes: NOT DETECTED

## 2017-12-29 LAB — CBC
HCT: 31 % — ABNORMAL LOW (ref 39.0–52.0)
HEMOGLOBIN: 9.4 g/dL — AB (ref 13.0–17.0)
MCH: 27.1 pg (ref 26.0–34.0)
MCHC: 30.3 g/dL (ref 30.0–36.0)
MCV: 89.3 fL (ref 78.0–100.0)
Platelets: 215 10*3/uL (ref 150–400)
RBC: 3.47 MIL/uL — ABNORMAL LOW (ref 4.22–5.81)
RDW: 17.2 % — ABNORMAL HIGH (ref 11.5–15.5)
WBC: 19.1 10*3/uL — AB (ref 4.0–10.5)

## 2017-12-29 LAB — GLUCOSE, CAPILLARY
GLUCOSE-CAPILLARY: 320 mg/dL — AB (ref 65–99)
GLUCOSE-CAPILLARY: 220 mg/dL — AB (ref 65–99)
GLUCOSE-CAPILLARY: 74 mg/dL (ref 65–99)
Glucose-Capillary: 63 mg/dL — ABNORMAL LOW (ref 65–99)
Glucose-Capillary: 40 mg/dL — CL (ref 65–99)

## 2017-12-29 LAB — LACTIC ACID, PLASMA
Lactic Acid, Venous: 1.7 mmol/L (ref 0.5–1.9)
Lactic Acid, Venous: 3.3 mmol/L (ref 0.5–1.9)

## 2017-12-29 LAB — STREP PNEUMONIAE URINARY ANTIGEN: STREP PNEUMO URINARY ANTIGEN: NEGATIVE

## 2017-12-29 LAB — PROCALCITONIN: Procalcitonin: 0.73 ng/mL

## 2017-12-29 LAB — MRSA PCR SCREENING: MRSA BY PCR: NEGATIVE

## 2017-12-29 MED ORDER — INSULIN REGULAR HUMAN 100 UNIT/ML IJ SOLN
200.0000 [IU] | Freq: Every day | INTRAMUSCULAR | Status: DC
  Administered 2017-12-30: 200 [IU] via SUBCUTANEOUS
  Administered 2018-01-02: 100 [IU] via SUBCUTANEOUS
  Filled 2017-12-29: qty 10
  Filled 2017-12-29: qty 2
  Filled 2017-12-29: qty 10
  Filled 2017-12-29 (×2): qty 2
  Filled 2017-12-29: qty 10
  Filled 2017-12-29 (×3): qty 2
  Filled 2017-12-29 (×3): qty 10
  Filled 2017-12-29 (×2): qty 2
  Filled 2017-12-29: qty 10

## 2017-12-29 MED ORDER — MENTHOL 3 MG MT LOZG: 1.0000 | LOZENGE | OROMUCOSAL | Status: DC | PRN

## 2017-12-29 MED ORDER — GUAIFENESIN ER 600 MG PO TB12
1200.0000 mg | ORAL_TABLET | Freq: Two times a day (BID) | ORAL | Status: DC
Start: 2017-12-29 — End: 2018-01-02
  Administered 2017-12-29 – 2018-01-02 (×9): 1200 mg via ORAL
  Filled 2017-12-29 (×9): qty 2

## 2017-12-29 MED ORDER — DEXTROSE 5 % IV SOLN
2.0000 g | INTRAVENOUS | Status: DC
  Administered 2017-12-29: 2 g via INTRAVENOUS
  Filled 2017-12-29: qty 2

## 2017-12-29 MED ORDER — PENICILLIN G POTASSIUM 5000000 UNITS IJ SOLR
3.0000 10*6.[IU] | INTRAMUSCULAR | Status: DC
  Filled 2017-12-29 (×2): qty 5

## 2017-12-29 MED ORDER — PENICILLIN G POT IN DEXTROSE 60000 UNIT/ML IV SOLN
3.0000 10*6.[IU] | INTRAVENOUS | Status: DC
  Administered 2017-12-29 – 2017-12-30 (×4): 3 10*6.[IU] via INTRAVENOUS
  Filled 2017-12-29 (×9): qty 50

## 2017-12-29 MED ORDER — SODIUM CHLORIDE 0.9 % IV BOLUS (SEPSIS)
500.0000 mL | Freq: Once | INTRAVENOUS | Status: AC
  Administered 2017-12-29: 500 mL via INTRAVENOUS

## 2017-12-29 NOTE — Progress Notes (Signed)
RT note: Patient refused to use his Dulera/Symbicort states he stopped using it at home because it made his breathing worse.

## 2017-12-29 NOTE — Progress Notes (Signed)
Inpatient Diabetes Program Recommendations  AACE/ADA: New Consensus Statement on Inpatient Glycemic Control (2015)  Target Ranges:  Prepandial:   less than 140 mg/dL      Peak postprandial:   less than 180 mg/dL (1-2 hours)      Critically ill patients:  140 - 180 mg/dL   Lab Results  Component Value Date   GLUCAP 320 (H) 12/29/2017   HGBA1C 7.4 11/11/2017    Review of Glycemic Control  Results for [name], [name] (MRN [id number]) as of 12/29/2017 12:26  Ref. Range 07/31/2017 12:41 07/31/2017 16:30 12/28/2017 20:45 12/28/2017 22:10 12/29/2017 09:26  Glucose-Capillary Latest Ref Range: 65 - 99 mg/dL 170 (H) 111 (H) 159 (H) 197 (H) 320 (H)    Diabetes history: Type 2 Outpatient Diabetes medications: Novolin R 300 units with breakfast, 200 units with lunch, supper and hs "depending on my sugars" Current orders for Inpatient glycemic control: Novolin  R 300 units qbreakfast, 240 units with lunch, 220 units with supper and 150 units qhs  Inpatient Diabetes Program Recommendations:  Please consider decreasing lunch time Novolin R to 200 units.  * RN staff- please check blood sugars and give insulin at least 4 hours apart and please ensure blood sugars are checked and insulin given within 1 hour of each other  Spoke to patient and his wife about his doses at home- he reports giving all his insulin in his lower abdomen (just above the pubic bone), rotating very little because the insulin syringe will not penetrate the upper abdomen.  He reports hard lumps under the skin where he gives his insulin- he does not use his arms or legs for administration. He takes his R insulin "depends on what my sugar is".  Reports having low blood sugars 1-2 times per week- near supper or through the middle of the night.  He sleeps in a recliner and keeps a regular Sprite at the side of the chair.  He uses a Coke or Sprite to treat a low blood sugar. I recommend only using 4 oz and then followed up with a small snack  (peanut butter crackers), 15 minutes later.   I have suggested he try and rotate his insulin and trying to use his arms for his early morning, breakfast dose or lunch time doses only. No further questions at this time.   Text page to MD for recommendations.  Gentry Fitz, RN, BA, MHA, CDE Diabetes Coordinator Inpatient Diabetes Program  [phone] (Team Pager) [phone] ([place]) 12/29/2017 12:32 PM

## 2017-12-29 NOTE — Progress Notes (Signed)
PHARMACY - PHYSICIAN COMMUNICATION CRITICAL VALUE ALERT - BLOOD CULTURE IDENTIFICATION (BCID)  [name] is an 77 y.o. male who presented to [place] on 12/28/2017  Assessment:  77 yo M presents with SOB and cough. CXR shows atelectasis, scarring, and/or fibrosis. Blood cx now showing Group B strep.  Name of physician (or Provider) Contacted: O. Sheikh  Current antibiotics: Ceftriaxone and azithromycin  Changes to prescribed antibiotics recommended:  Consider stopping Rocephin and azithro and switch to PCN G 2-3 million units IV Q4h (due to reduced CrCl)  Results for orders placed or performed during the hospital encounter of 12/28/17  Blood Culture ID Panel (Reflexed) (Collected: 12/28/2017  6:17 PM)  Result Value Ref Range   Enterococcus species NOT DETECTED NOT DETECTED   Listeria monocytogenes NOT DETECTED NOT DETECTED   Staphylococcus species NOT DETECTED NOT DETECTED   Staphylococcus aureus NOT DETECTED NOT DETECTED   Streptococcus species DETECTED (A) NOT DETECTED   Streptococcus agalactiae DETECTED (A) NOT DETECTED   Streptococcus pneumoniae NOT DETECTED NOT DETECTED   Streptococcus pyogenes NOT DETECTED NOT DETECTED   Acinetobacter baumannii NOT DETECTED NOT DETECTED   Enterobacteriaceae species NOT DETECTED NOT DETECTED   Enterobacter cloacae complex NOT DETECTED NOT DETECTED   Escherichia coli NOT DETECTED NOT DETECTED   Klebsiella oxytoca NOT DETECTED NOT DETECTED   Klebsiella pneumoniae NOT DETECTED NOT DETECTED   Proteus species NOT DETECTED NOT DETECTED   Serratia marcescens NOT DETECTED NOT DETECTED   Haemophilus influenzae NOT DETECTED NOT DETECTED   Neisseria meningitidis NOT DETECTED NOT DETECTED   Pseudomonas aeruginosa NOT DETECTED NOT DETECTED   Candida albicans NOT DETECTED NOT DETECTED   Candida glabrata NOT DETECTED NOT DETECTED   Candida krusei NOT DETECTED NOT DETECTED   Candida parapsilosis NOT DETECTED NOT DETECTED   Candida tropicalis NOT  DETECTED NOT DETECTED    [place] Diana J 12/29/2017  11:01 AM

## 2017-12-29 NOTE — Progress Notes (Addendum)
PROGRESS NOTE    [name]  JXB:[id number] DOB: 1941-09-24 DOA: 12/28/2017 PCP: Renato Shin, MD   Brief Narrative:  [name] is a 77 y.o. male with medical history significant of COPD, DM2 with CKD stage 3 currently requiring 910 units of insulin / day treatment by [name], glaucoma on 5 eye drops, SSS with pacer, h/o DVT, A.Fib on eliquis.  Patient presents to the ED with c/o cough and 2 days of SOB, intermittent L leg / groin pain.  No wheezing, no sputum production, no CP, no vomiting, no abd pain, no sore throat.  Symptoms onset Friday, worsened over weekend. He was started on Rocephin and Azithromycin for CAP but was found to have a Strep Bacteremia and was positive for Coronaviurs.   Assessment & Plan:   Principal Problem:   Sepsis ([facility]) Active Problems:   COPD (chronic obstructive pulmonary disease) (HCC)   CKD (chronic kidney disease)   Diabetes mellitus type 2 in obese (HCC)   Influenza-like illness  Sepsis 2/2 to Strep Bacteremia -Blood Cx 2/2 Positive; Repeat in AM  -Changed IV Abx from Ceftriaxone/Azithromycin to IV Pen G q4h -? Source is the Legs and ulcerations -Continue to monitor patient's Clinical Response -Temperature Improved -Given 1 Liter boulus and will C/w IVF at 125 mL/hr -Lactic Acidosis had improved but then wen 3.3 so given Bolus 500 mL today -WBC worsened to 19.1 -Holding Lasix, Holding metoprolol -Procalcitonin was 0.73 -C/w Acetaminophen for Pain  -Check ECHOCardiogram  Upper Respiratory Infection from Corona Virus -Initially though possibly CAP though not called on CXR -PNA pathway on admission -Rocephin / Azithro now stopped -Strep Pneumo Negative -Flu PCR Negative. Respiratory Virus Panel Positive for Coronavirus 229E -C/w Delsym and Mucinex 1200 mg po BID -C/w Dulera and DuoNeb q4h  COPD  -Continue Home Inhalers and DuoNeb q4h -Lung sounds clear in ED, will hold off on further steroids  CKD stage 3  -Chronic and  stable -BUN/Cr went from 29/1.92 -> 34/2.00 -Holding Lasix as above -Repeat BMP in AM  Insulin Dependent DM2  -Continue home insulin dosing for now; Has a large amount of Daily Insulin -Diab Coordinator Recc's appreciated -CBG checks AC/HS -CBG's ranging from 63-329  SSS and Hx of A.Fib -Continue Eliquis 5 ,mg BID -Continue Amiodarone -Holding metoprolol for now given softer pressures but may start in AM   LE Extremity Skin Changes -WOC Consult  Normocytic Anemia Holding Lasix, Holding metoprolol -Likely Dilutional Drop -Patient's Hb/Hct went form 11.3/36.7 -> 9.4/31.0 -Continue to Monitor for S/Sx of Bleeding as patient is Anticoagulated with Eliquis -Repeat CBC in AM   Glaucoma -C/w Netasurdil Dimesylate, Dorzolamide, Brimodine, Timolol, and Travoprost   HLD -C/w Rosuvastatin 20 mg po qHS and Omega-3 Acid Eythl Esters  GERD -C/w Pantoprazole 80 mg po Daily  Hypothyroidism -Check TSH -C/w Levothyroxine 75 mcg po Daily  Diastolic CHF -Continue to Monitor Volume Status now that patient is being Hydrated with NS at 125 mL/hr  Right Leg/Groin Pain -Hx of Blood Clot -Obtained U/S LLE Venous Duplex and showed Difficult exam due to patient body habitus, breathing treatment, position and pitting edema.  No evidence of deep vein thrombosis in the visualized veins. Negative for bakers cyst on the left  DVT prophylaxis: Anticoagulated with Apixaban  Code Status: DO NOT RESUSCITATE Family Communication: None Disposition Plan: Remain Inpatient for further treatment  Consultants:   Diabetes Coordinator   Procedures:  LE VENOUS DUPLEX Preliminary findings: Difficult exam due to patient body habitus, breathing  treatment, position and pitting edema.  No evidence of deep vein thrombosis in the visualized veins. Negative for bakers cyst on the left.   Antimicrobials:  Anti-infectives (From admission, onward)   Start     Dose/Rate Route Frequency Ordered Stop   12/29/17  1800  cefTRIAXone (ROCEPHIN) 1 g in dextrose 5 % 50 mL IVPB  Status:  Discontinued     1 g 100 mL/hr over 30 Minutes Intravenous Every 24 hours 12/28/17 1946 12/29/17 0945   12/29/17 1800  azithromycin (ZITHROMAX) tablet 500 mg  Status:  Discontinued     500 mg Oral Every 24 hours 12/28/17 1946 12/29/17 1606   12/29/17 1700  penicillin G potassium injection 3 Million Units  Status:  Discontinued     3 Million Units Intramuscular Every 4 hours 12/29/17 1609 12/29/17 1627   12/29/17 1700  penicillin G potassium 3 Million Units in dextrose 33mL IVPB     3 Million Units 100 mL/hr over 30 Minutes Intravenous Every 4 hours 12/29/17 1629     12/29/17 1200  cefTRIAXone (ROCEPHIN) 2 g in dextrose 5 % 50 mL IVPB  Status:  Discontinued     2 g 100 mL/hr over 30 Minutes Intravenous Every 24 hours 12/29/17 0945 12/29/17 1606   12/28/17 1830  cefTRIAXone (ROCEPHIN) 1 g in dextrose 5 % 50 mL IVPB     1 g 100 mL/hr over 30 Minutes Intravenous  Once 12/28/17 1828 12/28/17 2217   12/28/17 1830  azithromycin (ZITHROMAX) 500 mg in dextrose 5 % 250 mL IVPB     500 mg 250 mL/hr over 60 Minutes Intravenous  Once 12/28/17 1828 12/29/17 0017     Subjective: Seen and examined and felt slightly better but is extremely hard of hearing. States he feels like he is not coughing up much and would like more mucinex. No CP or SOB. States leg changes have gotten worse recently.   Objective: Vitals:   12/28/17 2102 12/29/17 0124 12/29/17 0618 12/29/17 1300  BP: (!) 98/47 (!) 99/42 (!) 104/45 (!) 109/54  Pulse: 71 69 68 70  Resp: (!) 26 (!) 24 20 20   Temp:  98.3 F (36.8 C) (!) 97.5 F (36.4 C) (!) 97.1 F (36.2 C)  TempSrc:  Oral Oral Oral  SpO2: 100% 95% 94% 100%    Intake/Output Summary (Last 24 hours) at 12/29/2017 2028 Last data filed at 12/29/2017 1812 Gross per 24 hour  Intake 4575.83 ml  Output 400 ml  Net 4175.83 ml   There were no vitals filed for this visit.  Examination: Physical  Exam:  Constitutional: WN/WD obese Caucasian Male in NAD and appears calm siting in chair bedside Eyes: Lids and conjunctivae normal, sclerae anicteric  ENMT: External Ears, Nose appear normal. Extremely normal hearing. Mucous membranes are slightly dry.  Neck: Appears normal, supple, no cervical masses, normal ROM, no appreciable thyromegaly; no JVD Respiratory: Diminished to auscultation bilaterally, no appreciable wheezing, rales, rhonchi or crackles. Normal respiratory effort and patient is not tachypenic. No accessory muscle use.  Cardiovascular: RRR, no murmurs / rubs / gallops. S1 and S2 auscultated. 1+ LE extremity edema.  Abdomen: Soft, non-tender, Distended due to body habitus. No masses palpated. No appreciable hepatosplenomegaly. Bowel sounds positive x4.  GU: Deferred. Musculoskeletal: No clubbing / cyanosis of digits/nails. No joint deformity upper and lower extremities. Skin: Bilateral LE Changes and wounds. No induration; Warm and dry.  Neurologic: Extremely hard of hearing. Romberg sign cerebellar reflexes not assessed.  Psychiatric: Normal judgment and  insight. Alert and oriented x 3. Normal mood and appropriate affect.   Data Reviewed: I have personally reviewed following labs and imaging studies  CBC: Recent Labs  Lab 12/28/17 1751 12/29/17 0628  WBC 16.0* 19.1*  NEUTROABS 12.4*  --   HGB 11.3* 9.4*  HCT 36.7* 31.0*  MCV 91.1 89.3  PLT 248 540   Basic Metabolic Panel: Recent Labs  Lab 12/28/17 1751 12/29/17 0628  NA 140 136  K 3.9 3.9  CL 102 102  CO2 21* 21*  GLUCOSE 127* 224*  BUN 29* 34*  CREATININE 1.92* 2.00*  CALCIUM 9.2 8.4*   GFR: CrCl cannot be calculated (Unknown ideal weight.). Liver Function Tests: Recent Labs  Lab 12/28/17 1751  AST 44*  ALT 29  ALKPHOS 102  BILITOT 0.7  PROT 7.7  ALBUMIN 3.4*   No results for input(s): LIPASE, AMYLASE in the last 168 hours. No results for input(s): AMMONIA in the last 168 hours. Coagulation  Profile: No results for input(s): INR, PROTIME in the last 168 hours. Cardiac Enzymes: No results for input(s): CKTOTAL, CKMB, CKMBINDEX, TROPONINI in the last 168 hours. BNP (last 3 results) Recent Labs    10/26/17 1425  PROBNP 1,148*   HbA1C: No results for input(s): HGBA1C in the last 72 hours. CBG: Recent Labs  Lab 12/28/17 2045 12/28/17 2210 12/29/17 0926 12/29/17 1323 12/29/17 1638  GLUCAP 159* 197* 320* 220* 63*   Lipid Profile: No results for input(s): CHOL, HDL, LDLCALC, TRIG, CHOLHDL, LDLDIRECT in the last 72 hours. Thyroid Function Tests: No results for input(s): TSH, T4TOTAL, FREET4, T3FREE, THYROIDAB in the last 72 hours. Anemia Panel: No results for input(s): VITAMINB12, FOLATE, FERRITIN, TIBC, IRON, RETICCTPCT in the last 72 hours. Sepsis Labs: Recent Labs  Lab 12/28/17 1819 12/28/17 2002 12/29/17 0833 12/29/17 1117  PROCALCITON  --   --  0.73  --   LATICACIDVEN 4.58* 3.27* 1.7 3.3*    Recent Results (from the past 240 hour(s))  Blood culture (routine x 2)     Status: None (Preliminary result)   Collection Time: 12/28/17  6:17 PM  Result Value Ref Range Status   Specimen Description BLOOD RIGHT ANTECUBITAL  Final   Special Requests   Final    BOTTLES DRAWN AEROBIC AND ANAEROBIC Blood Culture adequate volume   Culture  Setup Time   Final    GRAM POSITIVE COCCI IN CHAINS IN BOTH AEROBIC AND ANAEROBIC BOTTLES CRITICAL RESULT CALLED TO, READ BACK BY AND VERIFIED WITH: PHARMD Karlene Einstein [id number] MLM    Culture GRAM POSITIVE COCCI  Final   Report Status PENDING  Incomplete  Blood culture (routine x 2)     Status: None (Preliminary result)   Collection Time: 12/28/17  6:17 PM  Result Value Ref Range Status   Specimen Description BLOOD BLOOD RIGHT FOREARM  Final   Special Requests   Final    BOTTLES DRAWN AEROBIC AND ANAEROBIC Blood Culture adequate volume   Culture  Setup Time   Final    GRAM POSITIVE COCCI IN CHAINS CRITICAL VALUE NOTED.   VALUE IS CONSISTENT WITH PREVIOUSLY REPORTED AND CALLED VALUE. IN BOTH AEROBIC AND ANAEROBIC BOTTLES    Culture GRAM POSITIVE COCCI  Final   Report Status PENDING  Incomplete  Blood Culture ID Panel (Reflexed)     Status: Abnormal   Collection Time: 12/28/17  6:17 PM  Result Value Ref Range Status   Enterococcus species NOT DETECTED NOT DETECTED Final   Listeria monocytogenes NOT DETECTED  NOT DETECTED Final   Staphylococcus species NOT DETECTED NOT DETECTED Final   Staphylococcus aureus NOT DETECTED NOT DETECTED Final   Streptococcus species DETECTED (A) NOT DETECTED Final    Comment: CRITICAL RESULT CALLED TO, READ BACK BY AND VERIFIED WITH: PHARMD N BATCHELDER [id number] MLM    Streptococcus agalactiae DETECTED (A) NOT DETECTED Final    Comment: CRITICAL RESULT CALLED TO, READ BACK BY AND VERIFIED WITH: PHARMD N BATCHELDER [id number] 0917 MLM    Streptococcus pneumoniae NOT DETECTED NOT DETECTED Final   Streptococcus pyogenes NOT DETECTED NOT DETECTED Final   Acinetobacter baumannii NOT DETECTED NOT DETECTED Final   Enterobacteriaceae species NOT DETECTED NOT DETECTED Final   Enterobacter cloacae complex NOT DETECTED NOT DETECTED Final   Escherichia coli NOT DETECTED NOT DETECTED Final   Klebsiella oxytoca NOT DETECTED NOT DETECTED Final   Klebsiella pneumoniae NOT DETECTED NOT DETECTED Final   Proteus species NOT DETECTED NOT DETECTED Final   Serratia marcescens NOT DETECTED NOT DETECTED Final   Haemophilus influenzae NOT DETECTED NOT DETECTED Final   Neisseria meningitidis NOT DETECTED NOT DETECTED Final   Pseudomonas aeruginosa NOT DETECTED NOT DETECTED Final   Candida albicans NOT DETECTED NOT DETECTED Final   Candida glabrata NOT DETECTED NOT DETECTED Final   Candida krusei NOT DETECTED NOT DETECTED Final   Candida parapsilosis NOT DETECTED NOT DETECTED Final   Candida tropicalis NOT DETECTED NOT DETECTED Final  Respiratory Panel by PCR     Status: Abnormal   Collection  Time: 12/28/17  8:55 PM  Result Value Ref Range Status   Adenovirus NOT DETECTED NOT DETECTED Final   Coronavirus 229E DETECTED (A) NOT DETECTED Final   Coronavirus HKU1 NOT DETECTED NOT DETECTED Final   Coronavirus NL63 NOT DETECTED NOT DETECTED Final   Coronavirus OC43 NOT DETECTED NOT DETECTED Final   Metapneumovirus NOT DETECTED NOT DETECTED Final   Rhinovirus / Enterovirus NOT DETECTED NOT DETECTED Final   Influenza A NOT DETECTED NOT DETECTED Final   Influenza B NOT DETECTED NOT DETECTED Final   Parainfluenza Virus 1 NOT DETECTED NOT DETECTED Final   Parainfluenza Virus 2 NOT DETECTED NOT DETECTED Final   Parainfluenza Virus 3 NOT DETECTED NOT DETECTED Final   Parainfluenza Virus 4 NOT DETECTED NOT DETECTED Final   Respiratory Syncytial Virus NOT DETECTED NOT DETECTED Final   Bordetella pertussis NOT DETECTED NOT DETECTED Final   Chlamydophila pneumoniae NOT DETECTED NOT DETECTED Final   Mycoplasma pneumoniae NOT DETECTED NOT DETECTED Final  MRSA PCR Screening     Status: None   Collection Time: 12/29/17 11:15 AM  Result Value Ref Range Status   MRSA by PCR NEGATIVE NEGATIVE Final    Comment:        The GeneXpert MRSA Assay (FDA approved for NASAL specimens only), is one component of a comprehensive MRSA colonization surveillance program. It is not intended to diagnose MRSA infection nor to guide or monitor treatment for MRSA infections.     Radiology Studies: Dg Chest Portable 1 View  Result Date: 12/28/2017 CLINICAL DATA:  Dyspnea and groin pain. EXAM: PORTABLE CHEST 1 VIEW COMPARISON:  07/28/2017 FINDINGS: Stable cardiomegaly with aortic atherosclerosis. No pneumonic consolidation nor overt pulmonary edema. Chronic stable subpleural areas of mild interstitial lung markings may reflect areas of subpleural fibrosis and/or atelectasis. Right atrial and right ventricular leads with left-sided pacemaker apparatus appear stable. No acute osseous abnormality. IMPRESSION:  Stable cardiomegaly with aortic atherosclerosis. Stable subpleural increased reticular lung markings may reflect areas of atelectasis,  scarring and/or fibrosis bilaterally. Electronically Signed   By: Ashley Royalty M.D.   On: 12/28/2017 17:56   Scheduled Meds: . acidophilus  1 capsule Oral Daily  . amiodarone  200 mg Oral Daily  . apixaban  5 mg Oral BID  . brimonidine  1 drop Left Eye TID  . cholecalciferol  1,000 Units Oral BID  . dorzolamide  1 drop Both Eyes BID  . guaiFENesin  1,200 mg Oral BID  . insulin regular  150 Units Subcutaneous QHS  . [START ON 12/30/2017] insulin regular  200 Units Subcutaneous Q lunch  . insulin regular  220 Units Subcutaneous Q supper  . insulin regular  300 Units Subcutaneous Q breakfast  . levothyroxine  75 mcg Oral QAC breakfast  . mometasone-formoterol  2 puff Inhalation BID  . multivitamin with minerals  1 tablet Oral Daily  . Netarsudil Dimesylate  1 drop Left Eye Daily  . omega-3 acid ethyl esters  1,000 mg Oral q morning - 10a  . pantoprazole  80 mg Oral Daily  . rosuvastatin  20 mg Oral QPM  . timolol  1 drop Both Eyes BID  . Travoprost (BAK Free)  1 drop Left Eye QHS   Continuous Infusions: . sodium chloride 125 mL/hr at 12/29/17 1803  . pencillin G potassium IV Stopped (12/29/17 1845)    LOS: 1 day   Kerney Elbe, DO [facility] Pager [phone]  If 7PM-7AM, please contact night-coverage [email or web address] Password [facility] 12/29/2017, 8:28 PM

## 2017-12-29 NOTE — Progress Notes (Signed)
CRITICAL VALUE ALERT  Critical Value:  Lactic Acid= 3.3  Date & Time Notied:  12/29/17 1250  Provider Notified: O. MDEKIYJ  Orders Received/Actions taken: Yes   Evalee Jefferson, RN

## 2017-12-29 NOTE — Progress Notes (Signed)
Patient refused recheck of dinner blood sugar. Stated they would like their blood sugar rechecked at bedtime, but not after dinner. RN notified oncoming RN about situation. Will continue to monitor.

## 2017-12-29 NOTE — Discharge Instructions (Signed)

## 2017-12-30 ENCOUNTER — Inpatient Hospital Stay (HOSPITAL_COMMUNITY): Payer: Medicare HMO

## 2017-12-30 DIAGNOSIS — J449 Chronic obstructive pulmonary disease, unspecified: Secondary | ICD-10-CM

## 2017-12-30 DIAGNOSIS — E1122 Type 2 diabetes mellitus with diabetic chronic kidney disease: Secondary | ICD-10-CM | POA: Diagnosis not present

## 2017-12-30 DIAGNOSIS — R69 Illness, unspecified: Secondary | ICD-10-CM

## 2017-12-30 DIAGNOSIS — I4891 Unspecified atrial fibrillation: Secondary | ICD-10-CM | POA: Diagnosis not present

## 2017-12-30 DIAGNOSIS — N183 Chronic kidney disease, stage 3 (moderate): Secondary | ICD-10-CM

## 2017-12-30 DIAGNOSIS — E11649 Type 2 diabetes mellitus with hypoglycemia without coma: Secondary | ICD-10-CM | POA: Diagnosis not present

## 2017-12-30 DIAGNOSIS — Z794 Long term (current) use of insulin: Secondary | ICD-10-CM | POA: Diagnosis not present

## 2017-12-30 DIAGNOSIS — A401 Sepsis due to streptococcus, group B: Secondary | ICD-10-CM

## 2017-12-30 DIAGNOSIS — J189 Pneumonia, unspecified organism: Secondary | ICD-10-CM

## 2017-12-30 DIAGNOSIS — E669 Obesity, unspecified: Secondary | ICD-10-CM

## 2017-12-30 DIAGNOSIS — E11622 Type 2 diabetes mellitus with other skin ulcer: Secondary | ICD-10-CM | POA: Diagnosis not present

## 2017-12-30 DIAGNOSIS — L97909 Non-pressure chronic ulcer of unspecified part of unspecified lower leg with unspecified severity: Secondary | ICD-10-CM | POA: Diagnosis not present

## 2017-12-30 DIAGNOSIS — I495 Sick sinus syndrome: Secondary | ICD-10-CM | POA: Diagnosis not present

## 2017-12-30 DIAGNOSIS — R0602 Shortness of breath: Secondary | ICD-10-CM

## 2017-12-30 DIAGNOSIS — E1169 Type 2 diabetes mellitus with other specified complication: Secondary | ICD-10-CM

## 2017-12-30 DIAGNOSIS — I5032 Chronic diastolic (congestive) heart failure: Secondary | ICD-10-CM | POA: Diagnosis not present

## 2017-12-30 DIAGNOSIS — A419 Sepsis, unspecified organism: Secondary | ICD-10-CM

## 2017-12-30 LAB — CBC WITH DIFFERENTIAL/PLATELET
Basophils Absolute: 0 10*3/uL (ref 0.0–0.1)
Basophils Relative: 0 %
EOS ABS: 0 10*3/uL (ref 0.0–0.7)
Eosinophils Relative: 0 %
HEMATOCRIT: 30.7 % — AB (ref 39.0–52.0)
HEMOGLOBIN: 9.3 g/dL — AB (ref 13.0–17.0)
LYMPHS ABS: 2.2 10*3/uL (ref 0.7–4.0)
LYMPHS PCT: 14 %
MCH: 27.4 pg (ref 26.0–34.0)
MCHC: 30.3 g/dL (ref 30.0–36.0)
MCV: 90.6 fL (ref 78.0–100.0)
MONOS PCT: 5 %
Monocytes Absolute: 0.8 10*3/uL (ref 0.1–1.0)
NEUTROS PCT: 81 %
Neutro Abs: 12.1 10*3/uL — ABNORMAL HIGH (ref 1.7–7.7)
Platelets: 211 10*3/uL (ref 150–400)
RBC: 3.39 MIL/uL — AB (ref 4.22–5.81)
RDW: 17.7 % — ABNORMAL HIGH (ref 11.5–15.5)
WBC: 15 10*3/uL — ABNORMAL HIGH (ref 4.0–10.5)

## 2017-12-30 LAB — COMPREHENSIVE METABOLIC PANEL
ALK PHOS: 84 U/L (ref 38–126)
ALT: 29 U/L (ref 17–63)
AST: 48 U/L — ABNORMAL HIGH (ref 15–41)
Albumin: 2.7 g/dL — ABNORMAL LOW (ref 3.5–5.0)
Anion gap: 13 (ref 5–15)
BILIRUBIN TOTAL: 0.5 mg/dL (ref 0.3–1.2)
BUN: 39 mg/dL — ABNORMAL HIGH (ref 6–20)
CALCIUM: 8.3 mg/dL — AB (ref 8.9–10.3)
CO2: 20 mmol/L — ABNORMAL LOW (ref 22–32)
CREATININE: 1.73 mg/dL — AB (ref 0.61–1.24)
Chloride: 106 mmol/L (ref 101–111)
GFR, EST AFRICAN AMERICAN: 43 mL/min — AB (ref >60)
GFR, EST NON AFRICAN AMERICAN: 37 mL/min — AB (ref >60)
Glucose, Bld: 74 mg/dL (ref 65–99)
Potassium: 3.9 mmol/L (ref 3.5–5.1)
Sodium: 139 mmol/L (ref 135–145)
TOTAL PROTEIN: 6.6 g/dL (ref 6.5–8.1)

## 2017-12-30 LAB — GLUCOSE, CAPILLARY
GLUCOSE-CAPILLARY: 138 mg/dL — AB (ref 65–99)
Glucose-Capillary: 40 mg/dL — CL (ref 65–99)
Glucose-Capillary: 75 mg/dL (ref 65–99)
Glucose-Capillary: 395 mg/dL — ABNORMAL HIGH (ref 65–99)
Glucose-Capillary: 244 mg/dL — ABNORMAL HIGH (ref 65–99)

## 2017-12-30 LAB — PHOSPHORUS: PHOSPHORUS: 2.6 mg/dL (ref 2.5–4.6)

## 2017-12-30 LAB — PROCALCITONIN: Procalcitonin: 0.73 ng/mL

## 2017-12-30 LAB — MAGNESIUM: Magnesium: 2.1 mg/dL (ref 1.7–2.4)

## 2017-12-30 MED ORDER — PENICILLIN G POT IN DEXTROSE 60000 UNIT/ML IV SOLN
3.0000 10*6.[IU] | INTRAVENOUS | Status: DC
  Administered 2017-12-30 – 2018-01-01 (×12): 3 10*6.[IU] via INTRAVENOUS
  Filled 2017-12-30 (×8): qty 50
  Filled 2017-12-30: qty 0
  Filled 2017-12-30: qty 50
  Filled 2017-12-30 (×3): qty 0
  Filled 2017-12-30 (×3): qty 50
  Filled 2017-12-30: qty 0
  Filled 2017-12-30: qty 50

## 2017-12-30 MED ORDER — INSULIN REGULAR HUMAN 100 UNIT/ML IJ SOLN
220.0000 [IU] | Freq: Every day | INTRAMUSCULAR | Status: DC
  Filled 2017-12-30 (×8): qty 2.2

## 2017-12-30 MED ORDER — INSULIN REGULAR HUMAN 100 UNIT/ML IJ SOLN
220.0000 [IU] | Freq: Every day | INTRAMUSCULAR | Status: DC
  Administered 2017-12-30: 220 [IU] via SUBCUTANEOUS
  Filled 2017-12-30 (×2): qty 2.2

## 2017-12-30 NOTE — Progress Notes (Signed)
Pt placed on CPAP for the night with oxygen.  Pt tolerating well.  Nasal cannula at bedside.

## 2017-12-30 NOTE — Progress Notes (Signed)
RT called to assess patient for SOB after going to bathroom.  Patient receiving nebulizer by RN. BBS diminished and clear. Currently sitting up in bed on RA, Spo2 100%. HR 89.  Patient states " he has a lot of congestion, and having trouble getting stuff up, although he has some productive cough. Patient states he feels better. Patient back on CPAP machine with 3lpm O2 bleed in.

## 2017-12-30 NOTE — Progress Notes (Signed)
PROGRESS NOTE    [name]  PJA:[id number] DOB: [birth year] DOA: 12/28/2017 PCP: Renato Shin, MD   Chief Complaint  Patient presents with  . COPD  . Shortness of Breath  . Groin Pain    Brief Narrative:  HPI On 12/28/2017 by Dr. Jennette Kettle [name] is a 77 y.o. male with medical history significant of COPD, DM2 with CKD stage 3 currently requiring 910 units of insulin / day treatment by [name], glaucoma on 5 eye drops, SSS with pacer, h/o DVT, A.Fib on eliquis.  Patient presents to the ED with c/o cough, intermittent L leg / groin pain.  No wheezing, no sputum production, no CP, no vomiting, no abd pain, no sore throat.  Symptoms onset Friday, worsened over weekend.  Interim history Found to have strep bacteremia and coronavirus. Being treated for CAP.  Assessment & Plan   Sepsis secondary to strep bacteremia -Presented with fever, leukocytosis -Possibly secondary to ulcerations on the lower extremities -blood cultures 2/2 Strep group B -Repeat blood cultures on 12/30/2017, currently pending -Echocardiogram pending  -Continue penicillin  Upper respiratory infection from coronavirus -Respiratory viral panel positive for coronavirus -Influenza PCR negative -Continue to layer, and treatments, Delsym and Mucinex -Strep pneumonia urine antigen negative -Chest x-ray reviewed, no infection noted, chronic changes  COPD -Continue nebulizer treatments  Chronic kidney disease, stage III -Creatinine currently 1.73 and stable -continue to monitor BMP  Diabetes mellitus, type II, uncontrolled, concave by episodes of hypoglycemia -Overnight, patient's CBGs dropped to the 40s -He is on approximately 900 units of insulin per day at home -Patient follows with Dr. [name] -Discussed with Dr. [name], patient does not need long acting insulin due to his drops overnight. Recommended discontinuing nightly insulin dose, and have patient follow up with him after  discharge.  Sick sinus syndrome/history of atrial fibrillation -Continue amiodarone, Eliquis  Lower extremity skin changes/possible wounds -Wound care consulted and appreciated  Normocytic anemia -Drop thought to be possibly dilutional, as patient's Lasix has been held -baseline approximately 11 -Continue to monitor CBC  Glaucoma -Continue Netasurdil Dimesylate, Dorzolamide, Brimodine, Timolol, and Travoprost   Hyperlipidemia -Continue statin, omega-3's  GERD -Continue PPI  Chronic diastolic heart failure -Patient was given IV fluid on admission, continue to monitor volume status  Leg/groin pain -History of blood clot -LE doppler: Difficult exam due to patient's body habitus, breathing treatment composition and pitting edema. No evidence of DVT in the visualized veins. Negative for Baker's cyst on the left  Hypothyroidism -Continue Synthroid  DVT Prophylaxis  Eliquis  Code Status: DNR  Family Communication: None at bedside  Disposition Plan: Admitted. Continue treatment of sepsis/infection  Consultants None  Procedures  LLE doppler  Antibiotics   Anti-infectives (From admission, onward)   Start     Dose/Rate Route Frequency Ordered Stop   12/30/17 1030  penicillin G potassium 3 Million Units in dextrose 9mL IVPB     3 Million Units 100 mL/hr over 30 Minutes Intravenous Every 4 hours 12/30/17 0803     12/29/17 1800  cefTRIAXone (ROCEPHIN) 1 g in dextrose 5 % 50 mL IVPB  Status:  Discontinued     1 g 100 mL/hr over 30 Minutes Intravenous Every 24 hours 12/28/17 1946 12/29/17 0945   12/29/17 1800  azithromycin (ZITHROMAX) tablet 500 mg  Status:  Discontinued     500 mg Oral Every 24 hours 12/28/17 1946 12/29/17 1606   12/29/17 1700  penicillin G potassium injection 3 Million Units  Status:  Discontinued     3 Million Units Intramuscular Every 4 hours 12/29/17 1609 12/29/17 1627   12/29/17 1700  penicillin G potassium 3 Million Units in dextrose 32mL IVPB   Status:  Discontinued     3 Million Units 100 mL/hr over 30 Minutes Intravenous Every 4 hours 12/29/17 1629 12/30/17 0803   12/29/17 1200  cefTRIAXone (ROCEPHIN) 2 g in dextrose 5 % 50 mL IVPB  Status:  Discontinued     2 g 100 mL/hr over 30 Minutes Intravenous Every 24 hours 12/29/17 0945 12/29/17 1606   12/28/17 1830  cefTRIAXone (ROCEPHIN) 1 g in dextrose 5 % 50 mL IVPB     1 g 100 mL/hr over 30 Minutes Intravenous  Once 12/28/17 1828 12/28/17 2217   12/28/17 1830  azithromycin (ZITHROMAX) 500 mg in dextrose 5 % 250 mL IVPB     500 mg 250 mL/hr over 60 Minutes Intravenous  Once 12/28/17 1828 12/29/17 0017      Subjective:   [name] seen and examined today.  Feels breathing is better than overnight. Felt he need oxygen overnight. Currently denies chest pain, abdominal pain, nausea vomiting, diarrhea or constipation.   Objective:   Vitals:   12/29/17 1300 12/29/17 2145 12/29/17 2310 12/30/17 0407  BP: (!) 109/54 110/60  116/64  Pulse: 70 71 69 72  Resp: 20 20 18 18   Temp: (!) 97.1 F (36.2 C) (!) 97.2 F (36.2 C)  97.9 F (36.6 C)  TempSrc: Oral Oral  Oral  SpO2: 100% 95% 96% 99%    Intake/Output Summary (Last 24 hours) at 12/30/2017 1041 Last data filed at 12/30/2017 0900 Gross per 24 hour  Intake 3255.83 ml  Output 200 ml  Net 3055.83 ml   There were no vitals filed for this visit.  Exam  General: Well developed, well nourished, NAD, appears stated age  HEENT: NCAT, mucous membranes moist.   Cardiovascular: S1 S2 auscultated, no rubs, murmurs or gallops. Regular rate and rhythm.  Respiratory: Expiratory wheezing noted  Abdomen: Soft, obese, nontender, distended, + bowel sounds  Extremities: warm dry without cyanosis clubbing. Trace LE edema  Neuro: AAOx3, nonfocal  Skin: Without rashes exudates or nodules, Lower ext skin changes, wounds  Psych: Appropriate mood and affect    Data Reviewed: I have personally reviewed following labs and imaging  studies  CBC: Recent Labs  Lab 12/28/17 1751 12/29/17 0628 12/30/17 0044  WBC 16.0* 19.1* 15.0*  NEUTROABS 12.4*  --  12.1*  HGB 11.3* 9.4* 9.3*  HCT 36.7* 31.0* 30.7*  MCV 91.1 89.3 90.6  PLT 248 215 938   Basic Metabolic Panel: Recent Labs  Lab 12/28/17 1751 12/29/17 0628 12/30/17 0044  NA 140 136 139  K 3.9 3.9 3.9  CL 102 102 106  CO2 21* 21* 20*  GLUCOSE 127* 224* 74  BUN 29* 34* 39*  CREATININE 1.92* 2.00* 1.73*  CALCIUM 9.2 8.4* 8.3*  MG  --   --  2.1  PHOS  --   --  2.6   GFR: CrCl cannot be calculated (Unknown ideal weight.). Liver Function Tests: Recent Labs  Lab 12/28/17 1751 12/30/17 0044  AST 44* 48*  ALT 29 29  ALKPHOS 102 84  BILITOT 0.7 0.5  PROT 7.7 6.6  ALBUMIN 3.4* 2.7*   No results for input(s): LIPASE, AMYLASE in the last 168 hours. No results for input(s): AMMONIA in the last 168 hours. Coagulation Profile: No results for input(s): INR, PROTIME in the last 168 hours. Cardiac  Enzymes: No results for input(s): CKTOTAL, CKMB, CKMBINDEX, TROPONINI in the last 168 hours. BNP (last 3 results) Recent Labs    10/26/17 1425  PROBNP 1,148*   HbA1C: No results for input(s): HGBA1C in the last 72 hours. CBG: Recent Labs  Lab 12/29/17 1638 12/29/17 2130 12/29/17 2245 12/30/17 0620 12/30/17 0733  GLUCAP 63* 40* 74 40* 75   Lipid Profile: No results for input(s): CHOL, HDL, LDLCALC, TRIG, CHOLHDL, LDLDIRECT in the last 72 hours. Thyroid Function Tests: No results for input(s): TSH, T4TOTAL, FREET4, T3FREE, THYROIDAB in the last 72 hours. Anemia Panel: No results for input(s): VITAMINB12, FOLATE, FERRITIN, TIBC, IRON, RETICCTPCT in the last 72 hours. Urine analysis:    Component Value Date/Time   COLORURINE YELLOW 07/28/2017 1833   APPEARANCEUR CLEAR 07/28/2017 1833   LABSPEC 1.017 07/28/2017 1833   PHURINE 5.0 07/28/2017 1833   GLUCOSEU >=500 (A) 07/28/2017 1833   GLUCOSEU 100 09/30/2013 1535   HGBUR NEGATIVE 07/28/2017 1833     BILIRUBINUR NEGATIVE 07/28/2017 1833   KETONESUR NEGATIVE 07/28/2017 1833   PROTEINUR NEGATIVE 07/28/2017 1833   UROBILINOGEN 1.0 10/01/2013 1003   NITRITE NEGATIVE 07/28/2017 1833   LEUKOCYTESUR NEGATIVE 07/28/2017 1833   Sepsis Labs: @LABRCNTIP (procalcitonin:4,lacticidven:4)  ) Recent Results (from the past 240 hour(s))  Blood culture (routine x 2)     Status: Abnormal (Preliminary result)   Collection Time: 12/28/17  6:17 PM  Result Value Ref Range Status   Specimen Description BLOOD RIGHT ANTECUBITAL  Final   Special Requests   Final    BOTTLES DRAWN AEROBIC AND ANAEROBIC Blood Culture adequate volume   Culture  Setup Time   Final    GRAM POSITIVE COCCI IN CHAINS IN BOTH AEROBIC AND ANAEROBIC BOTTLES CRITICAL RESULT CALLED TO, READ BACK BY AND VERIFIED WITH: Sandria Bales [id number] MLM    Culture GROUP B STREP(S.AGALACTIAE)ISOLATED (A)  Final   Report Status PENDING  Incomplete  Blood culture (routine x 2)     Status: None (Preliminary result)   Collection Time: 12/28/17  6:17 PM  Result Value Ref Range Status   Specimen Description BLOOD BLOOD RIGHT FOREARM  Final   Special Requests   Final    BOTTLES DRAWN AEROBIC AND ANAEROBIC Blood Culture adequate volume   Culture  Setup Time   Final    GRAM POSITIVE COCCI IN CHAINS CRITICAL VALUE NOTED.  VALUE IS CONSISTENT WITH PREVIOUSLY REPORTED AND CALLED VALUE. IN BOTH AEROBIC AND ANAEROBIC BOTTLES    Culture GRAM POSITIVE COCCI  Final   Report Status PENDING  Incomplete  Blood Culture ID Panel (Reflexed)     Status: Abnormal   Collection Time: 12/28/17  6:17 PM  Result Value Ref Range Status   Enterococcus species NOT DETECTED NOT DETECTED Final   Listeria monocytogenes NOT DETECTED NOT DETECTED Final   Staphylococcus species NOT DETECTED NOT DETECTED Final   Staphylococcus aureus NOT DETECTED NOT DETECTED Final   Streptococcus species DETECTED (A) NOT DETECTED Final    Comment: CRITICAL RESULT CALLED TO, READ  BACK BY AND VERIFIED WITH: PHARMD N BATCHELDER [id number] MLM    Streptococcus agalactiae DETECTED (A) NOT DETECTED Final    Comment: CRITICAL RESULT CALLED TO, READ BACK BY AND VERIFIED WITH: PHARMD N BATCHELDER [id number] 0917 MLM    Streptococcus pneumoniae NOT DETECTED NOT DETECTED Final   Streptococcus pyogenes NOT DETECTED NOT DETECTED Final   Acinetobacter baumannii NOT DETECTED NOT DETECTED Final   Enterobacteriaceae species NOT DETECTED NOT DETECTED Final  Enterobacter cloacae complex NOT DETECTED NOT DETECTED Final   Escherichia coli NOT DETECTED NOT DETECTED Final   Klebsiella oxytoca NOT DETECTED NOT DETECTED Final   Klebsiella pneumoniae NOT DETECTED NOT DETECTED Final   Proteus species NOT DETECTED NOT DETECTED Final   Serratia marcescens NOT DETECTED NOT DETECTED Final   Haemophilus influenzae NOT DETECTED NOT DETECTED Final   Neisseria meningitidis NOT DETECTED NOT DETECTED Final   Pseudomonas aeruginosa NOT DETECTED NOT DETECTED Final   Candida albicans NOT DETECTED NOT DETECTED Final   Candida glabrata NOT DETECTED NOT DETECTED Final   Candida krusei NOT DETECTED NOT DETECTED Final   Candida parapsilosis NOT DETECTED NOT DETECTED Final   Candida tropicalis NOT DETECTED NOT DETECTED Final  Respiratory Panel by PCR     Status: Abnormal   Collection Time: 12/28/17  8:55 PM  Result Value Ref Range Status   Adenovirus NOT DETECTED NOT DETECTED Final   Coronavirus 229E DETECTED (A) NOT DETECTED Final   Coronavirus HKU1 NOT DETECTED NOT DETECTED Final   Coronavirus NL63 NOT DETECTED NOT DETECTED Final   Coronavirus OC43 NOT DETECTED NOT DETECTED Final   Metapneumovirus NOT DETECTED NOT DETECTED Final   Rhinovirus / Enterovirus NOT DETECTED NOT DETECTED Final   Influenza A NOT DETECTED NOT DETECTED Final   Influenza B NOT DETECTED NOT DETECTED Final   Parainfluenza Virus 1 NOT DETECTED NOT DETECTED Final   Parainfluenza Virus 2 NOT DETECTED NOT DETECTED Final    Parainfluenza Virus 3 NOT DETECTED NOT DETECTED Final   Parainfluenza Virus 4 NOT DETECTED NOT DETECTED Final   Respiratory Syncytial Virus NOT DETECTED NOT DETECTED Final   Bordetella pertussis NOT DETECTED NOT DETECTED Final   Chlamydophila pneumoniae NOT DETECTED NOT DETECTED Final   Mycoplasma pneumoniae NOT DETECTED NOT DETECTED Final  MRSA PCR Screening     Status: None   Collection Time: 12/29/17 11:15 AM  Result Value Ref Range Status   MRSA by PCR NEGATIVE NEGATIVE Final    Comment:        The GeneXpert MRSA Assay (FDA approved for NASAL specimens only), is one component of a comprehensive MRSA colonization surveillance program. It is not intended to diagnose MRSA infection nor to guide or monitor treatment for MRSA infections.       Radiology Studies: Dg Chest Port 1 View  Result Date: 12/30/2017 CLINICAL DATA:  Shortness of Breath EXAM: PORTABLE CHEST 1 VIEW COMPARISON:  12/28/2017 FINDINGS: Cardiac shadow is enlarged. Pacing device is again seen and stable. Diffuse interstitial changes are again identified throughout both lungs stable from the prior exam. No new focal abnormality is seen. No bony abnormality is noted. IMPRESSION: Chronic changes without acute abnormality. Electronically Signed   By: Inez Catalina M.D.   On: 12/30/2017 08:25   Dg Chest Portable 1 View  Result Date: 12/28/2017 CLINICAL DATA:  Dyspnea and groin pain. EXAM: PORTABLE CHEST 1 VIEW COMPARISON:  07/28/2017 FINDINGS: Stable cardiomegaly with aortic atherosclerosis. No pneumonic consolidation nor overt pulmonary edema. Chronic stable subpleural areas of mild interstitial lung markings may reflect areas of subpleural fibrosis and/or atelectasis. Right atrial and right ventricular leads with left-sided pacemaker apparatus appear stable. No acute osseous abnormality. IMPRESSION: Stable cardiomegaly with aortic atherosclerosis. Stable subpleural increased reticular lung markings may reflect areas of  atelectasis, scarring and/or fibrosis bilaterally. Electronically Signed   By: Ashley Royalty M.D.   On: 12/28/2017 17:56     Scheduled Meds: . acidophilus  1 capsule Oral Daily  . amiodarone  200 mg Oral Daily  . apixaban  5 mg Oral BID  . brimonidine  1 drop Left Eye TID  . cholecalciferol  1,000 Units Oral BID  . dorzolamide  1 drop Both Eyes BID  . guaiFENesin  1,200 mg Oral BID  . insulin regular  150 Units Subcutaneous QHS  . insulin regular  200 Units Subcutaneous Q lunch  . insulin regular  220 Units Subcutaneous Q supper  . insulin regular  300 Units Subcutaneous Q breakfast  . levothyroxine  75 mcg Oral QAC breakfast  . mometasone-formoterol  2 puff Inhalation BID  . multivitamin with minerals  1 tablet Oral Daily  . Netarsudil Dimesylate  1 drop Left Eye Daily  . omega-3 acid ethyl esters  1,000 mg Oral q morning - 10a  . pantoprazole  80 mg Oral Daily  . rosuvastatin  20 mg Oral QPM  . timolol  1 drop Both Eyes BID  . Travoprost (BAK Free)  1 drop Left Eye QHS   Continuous Infusions: . sodium chloride 125 mL/hr at 12/29/17 1803  . pencillin G potassium IV 3 Million Units (12/30/17 1028)     LOS: 2 days   Time Spent in minutes   45 minutes  Dilpreet Faires D.O. on 12/30/2017 at 10:41 AM  Between 7am to 7pm - Pager - [phone]  After 7pm go to [email or web address] - password [email or web address]  And look for the night coverage person covering for me after hours  [facility] Office  [phone]

## 2017-12-30 NOTE — Progress Notes (Signed)
Results for JERVIS, TRAPANI (MRN [id number]) as of 12/30/2017 12:28  Ref. Range 12/29/2017 21:30 12/29/2017 22:45 12/30/2017 06:20 12/30/2017 07:33 12/30/2017 11:40  Glucose-Capillary Latest Ref Range: 65 - 99 mg/dL 40 (LL) 74 40 (LL) 75 395 (H)  Noted that patient's blood sugars were less than 70 mg/dl last night and this morning.  Noted that Novolin Regular was not given this am following low blood sugar. Lunch CBG is 395 mg/dl since patient did not receive Regular insulin following breakfast.  In looking at patient's home insulin dosages, recommend decreasing Novolin Regular for each meal to 200 units TID. If patient's blood sugar is less than 70 mg/dl, recommend calling physician to get decreased dosage of insulin at that time.   Harvel Ricks RN BSN CDE Diabetes Coordinator Pager: [phone]  8am-5pm

## 2017-12-31 LAB — BASIC METABOLIC PANEL
Anion gap: 11 (ref 5–15)
BUN: 28 mg/dL — ABNORMAL HIGH (ref 6–20)
CHLORIDE: 103 mmol/L (ref 101–111)
CO2: 21 mmol/L — ABNORMAL LOW (ref 22–32)
CREATININE: 1.52 mg/dL — AB (ref 0.61–1.24)
Calcium: 8.2 mg/dL — ABNORMAL LOW (ref 8.9–10.3)
GFR calc Af Amer: 50 mL/min — ABNORMAL LOW (ref >60)
GFR, EST NON AFRICAN AMERICAN: 43 mL/min — AB (ref >60)
Glucose, Bld: 235 mg/dL — ABNORMAL HIGH (ref 65–99)
POTASSIUM: 4.3 mmol/L (ref 3.5–5.1)
SODIUM: 135 mmol/L (ref 135–145)

## 2017-12-31 LAB — CULTURE, BLOOD (ROUTINE X 2)
SPECIAL REQUESTS: ADEQUATE
Special Requests: ADEQUATE

## 2017-12-31 LAB — CBC
HCT: 32.6 % — ABNORMAL LOW (ref 39.0–52.0)
HEMOGLOBIN: 9.8 g/dL — AB (ref 13.0–17.0)
MCH: 27.6 pg (ref 26.0–34.0)
MCHC: 30.1 g/dL (ref 30.0–36.0)
MCV: 91.8 fL (ref 78.0–100.0)
PLATELETS: 220 10*3/uL (ref 150–400)
RBC: 3.55 MIL/uL — ABNORMAL LOW (ref 4.22–5.81)
RDW: 17.7 % — ABNORMAL HIGH (ref 11.5–15.5)
WBC: 9.7 10*3/uL (ref 4.0–10.5)

## 2017-12-31 LAB — GLUCOSE, CAPILLARY
GLUCOSE-CAPILLARY: 25 mg/dL — AB (ref 65–99)
GLUCOSE-CAPILLARY: 209 mg/dL — AB (ref 65–99)
GLUCOSE-CAPILLARY: 358 mg/dL — AB (ref 65–99)
Glucose-Capillary: 129 mg/dL — ABNORMAL HIGH (ref 65–99)
Glucose-Capillary: 148 mg/dL — ABNORMAL HIGH (ref 65–99)
Glucose-Capillary: 220 mg/dL — ABNORMAL HIGH (ref 65–99)
Glucose-Capillary: 239 mg/dL — ABNORMAL HIGH (ref 65–99)
Glucose-Capillary: 256 mg/dL — ABNORMAL HIGH (ref 65–99)
Glucose-Capillary: 291 mg/dL — ABNORMAL HIGH (ref 65–99)
Glucose-Capillary: 327 mg/dL — ABNORMAL HIGH (ref 65–99)
Glucose-Capillary: 368 mg/dL — ABNORMAL HIGH (ref 65–99)
Glucose-Capillary: 58 mg/dL — ABNORMAL LOW (ref 65–99)
Glucose-Capillary: 78 mg/dL (ref 65–99)

## 2017-12-31 LAB — PROCALCITONIN: Procalcitonin: 0.27 ng/mL

## 2017-12-31 MED ORDER — INSULIN REGULAR HUMAN 100 UNIT/ML IJ SOLN
190.0000 [IU] | Freq: Every day | INTRAMUSCULAR | Status: DC
  Filled 2017-12-31: qty 1.9

## 2017-12-31 MED ORDER — INSULIN ASPART 100 UNIT/ML ~~LOC~~ SOLN
25.0000 [IU] | Freq: Once | SUBCUTANEOUS | Status: AC
  Administered 2017-12-31: 25 [IU] via SUBCUTANEOUS

## 2017-12-31 MED ORDER — INSULIN ASPART 100 UNIT/ML ~~LOC~~ SOLN: 100.0000 [IU] | Freq: Every day | SUBCUTANEOUS | Status: DC

## 2017-12-31 MED ORDER — INSULIN ASPART 100 UNIT/ML ~~LOC~~ SOLN
10.0000 [IU] | Freq: Once | SUBCUTANEOUS | Status: AC
  Administered 2017-12-31: 10 [IU] via SUBCUTANEOUS

## 2017-12-31 MED ORDER — SODIUM CHLORIDE 0.9 % IV SOLN: INTRAVENOUS | Status: DC

## 2017-12-31 MED ORDER — PENICILLIN G POT IN DEXTROSE 60000 UNIT/ML IV SOLN: 3.0000 10*6.[IU] | INTRAVENOUS | Status: DC

## 2017-12-31 MED ORDER — INSULIN REGULAR HUMAN 100 UNIT/ML IJ SOLN
100.0000 [IU] | Freq: Every day | INTRAMUSCULAR | Status: DC
  Administered 2017-12-31 – 2018-01-01 (×2): 100 [IU] via SUBCUTANEOUS
  Filled 2017-12-31 (×3): qty 1

## 2017-12-31 MED ORDER — SALINE SPRAY 0.65 % NA SOLN
1.0000 | NASAL | Status: DC | PRN
  Filled 2017-12-31: qty 44

## 2017-12-31 MED ORDER — INSULIN ASPART 100 UNIT/ML ~~LOC~~ SOLN: 100.0000 [IU] | Freq: Three times a day (TID) | SUBCUTANEOUS | Status: DC

## 2017-12-31 NOTE — Progress Notes (Addendum)
Spoke with patient to clarify his insulin dosages of Novolin Regular at home. States that he has been on insulin for 30 years.  Has tried Lantus, Levemir, and U-500 insulin before. States that he usually takes up to 910 units of Novolin Regular daily total.  He is not able to use U-500 because his insurance does not cover it. States that he is eating less here at the hospital than he eats at home, so blood sugars are difficult to control. States also that he would rather have his blood sugars 200 mg/dl rather than less than 100 mg/dl.   Patient uses a similar sliding scale at home for his insulin dosages: (according to blood sugars before each meal and at bedtime)  Given with each meal and bedtime at home: CBG 100-150   No insulin CBG 150-200  20-30 units of Novolin Regular CBG 200-250   50 units  Regular CBG 250-300   70-80 units Regular CBG 350-400   100-150 units Regular CBG 450-500    200 units  Regular CBG  >500    200 units of Regular,  then wait 30 minutes and check CBG again.                     May give more if needed.   Patient seems to know exactly how much insulin he needs according to blood sugars checked.   Harvel Ricks RN BSN CDE Diabetes Coordinator Pager: [phone]  8am-5pm

## 2017-12-31 NOTE — Progress Notes (Signed)
    [facility] has been requested to perform a transesophageal echocardiogram on [name] for strep bacteremia.  After careful review of history and examination, the risks and benefits of transesophageal echocardiogram have been explained including risks of esophageal damage, perforation (1:10,000 risk), bleeding, pharyngeal hematoma as well as other potential complications associated with conscious sedation including aspiration, arrhythmia, respiratory failure and death. Alternatives to treatment were discussed, questions were answered. Patient is willing to proceed.   Kathyrn Drown NP-C [facility] Pager: [phone] 12/31/2017 4:27 PM

## 2017-12-31 NOTE — Progress Notes (Signed)
Pt's blood sugar this morning BS 58. 50% Dextrose IV syringe given, along with oj. Will retake and continue to monitor. Pt not symptomatic.

## 2017-12-31 NOTE — Progress Notes (Signed)
Pt blood sugar has been checked q2h at  8 am -209 10 am - 239 12 pm - 220 2 pm - 291 4 pm - 327 Called Dr Ree Kida regarding insulin she stated to give 100 units instead of 190 for supper which is being given.    Harvel Ricks DM coordinator was notified and spoke with pt regarding insulin. Please see her note.  Pt states that 300 units in the am are normal after his meals. And between 150-250 is normal for lunch time.  Pt also states that 200 blood sugar is normal for him in the morning.  Tillie Rung has suggested a sliding scale for pt please see her notes

## 2017-12-31 NOTE — Progress Notes (Signed)
RN came into room after Pt reported not feeling well.  Pt diaphoretic, not able to answer questions. Blood sugar checked BS 25. Administered 50% Dextrose IV Orange juice given, as well as peanut butter and crackers. Blood sugar up to 129. Hospitalist notified.

## 2017-12-31 NOTE — Progress Notes (Signed)
PROGRESS NOTE    [name]  ZOX:[id number] DOB: 1940-12-22 DOA: 12/28/2017 PCP: Renato Shin, MD   Chief Complaint  Patient presents with  . COPD  . Shortness of Breath  . Groin Pain    Brief Narrative:  HPI On 12/28/2017 by Dr. Jennette Kettle [name] is a 77 y.o. male with medical history significant of COPD, DM2 with CKD stage 3 currently requiring 910 units of insulin / day treatment by [name], glaucoma on 5 eye drops, SSS with pacer, h/o DVT, A.Fib on eliquis.  Patient presents to the ED with c/o cough, intermittent L leg / groin pain.  No wheezing, no sputum production, no CP, no vomiting, no abd pain, no sore throat.  Symptoms onset Friday, worsened over weekend.  Interim history Found to have strep bacteremia and coronavirus. Being treated for CAP.  Assessment & Plan   Sepsis secondary to strep bacteremia -Presented with fever, leukocytosis -Possibly secondary to ulcerations on the lower extremities -blood cultures 2/2 Strep group B -Repeat blood cultures on 12/30/2017, show no growth to date -Echocardiogram ordered, however patient unable to tolerate study due to positioning  -Continue penicillin  Upper respiratory infection from coronavirus -Respiratory viral panel positive for coronavirus -Influenza PCR negative -Continue to layer, and treatments, Delsym and Mucinex -Strep pneumonia urine antigen negative -Chest x-ray reviewed, no infection noted, chronic changes -will add on nasal spray for nasal congestion  COPD -Continue nebulizer treatments  Chronic kidney disease, stage III -Creatinine currently 1.52 and stable -continue to monitor BMP  Diabetes mellitus, type II, uncontrolled, concave by episodes of hypoglycemia -Overnight, patient's CBGs dropped again -He is on approximately 900 units of insulin per day at home -Patient follows with Dr. [name] -Discussed with Dr. [name], patient does not need long acting insulin due to his drops  overnight. Recommended discontinuing nightly insulin dose, and have patient follow up with him after discharge. -will decrease PM dose of insulin   Sick sinus syndrome/history of atrial fibrillation -Continue amiodarone, Eliquis  Lower extremity skin changes/possible wounds -Wound care consulted and appreciated  Normocytic anemia -Drop thought to be possibly dilutional, as patient's Lasix has been held -hemoglobin today 9.8 and appears to be stable (baseline approximately 11) -Continue to monitor CBC  Glaucoma -Continue Netasurdil Dimesylate, Dorzolamide, Brimodine, Timolol, and Travoprost   Hyperlipidemia -Continue statin, omega-3's  GERD -Continue PPI  Chronic diastolic heart failure -Patient was given IV fluid on admission, continue to monitor volume status  Leg/groin pain -History of blood clot -LE doppler: Difficult exam due to patient's body habitus, breathing treatment composition and pitting edema. No evidence of DVT in the visualized veins. Negative for Baker's cyst on the left  Hypothyroidism -Continue Synthroid  DVT Prophylaxis  Eliquis  Code Status: DNR  Family Communication: None at bedside  Disposition Plan: Admitted. Continue treatment of sepsis/infection  Consultants Dr. [name], endocrinology, via phone  Procedures  LLE doppler  Antibiotics   Anti-infectives (From admission, onward)   Start     Dose/Rate Route Frequency Ordered Stop   12/31/17 1100  penicillin G potassium 3 Million Units in dextrose 18mL IVPB  Status:  Discontinued     3 Million Units 100 mL/hr over 30 Minutes Intravenous Every 4 hours 12/31/17 0840 12/31/17 0840   12/30/17 1030  penicillin G potassium 3 Million Units in dextrose 44mL IVPB     3 Million Units 100 mL/hr over 30 Minutes Intravenous Every 4 hours 12/30/17 0803     12/29/17 1800  cefTRIAXone (  ROCEPHIN) 1 g in dextrose 5 % 50 mL IVPB  Status:  Discontinued     1 g 100 mL/hr over 30 Minutes Intravenous Every 24  hours 12/28/17 1946 12/29/17 0945   12/29/17 1800  azithromycin (ZITHROMAX) tablet 500 mg  Status:  Discontinued     500 mg Oral Every 24 hours 12/28/17 1946 12/29/17 1606   12/29/17 1700  penicillin G potassium injection 3 Million Units  Status:  Discontinued     3 Million Units Intramuscular Every 4 hours 12/29/17 1609 12/29/17 1627   12/29/17 1700  penicillin G potassium 3 Million Units in dextrose 66mL IVPB  Status:  Discontinued     3 Million Units 100 mL/hr over 30 Minutes Intravenous Every 4 hours 12/29/17 1629 12/30/17 0803   12/29/17 1200  cefTRIAXone (ROCEPHIN) 2 g in dextrose 5 % 50 mL IVPB  Status:  Discontinued     2 g 100 mL/hr over 30 Minutes Intravenous Every 24 hours 12/29/17 0945 12/29/17 1606   12/28/17 1830  cefTRIAXone (ROCEPHIN) 1 g in dextrose 5 % 50 mL IVPB     1 g 100 mL/hr over 30 Minutes Intravenous  Once 12/28/17 1828 12/28/17 2217   12/28/17 1830  azithromycin (ZITHROMAX) 500 mg in dextrose 5 % 250 mL IVPB     500 mg 250 mL/hr over 60 Minutes Intravenous  Once 12/28/17 1828 12/29/17 0017      Subjective:   [name] seen and examined today.  Complains of nasal congestion. Denies current chest pain, abdominal pain, N/V/D/C. Feels breathing is mildly better but not back to his baseline and continues to cough.   Objective:   Vitals:   12/30/17 1200 12/30/17 1300 12/30/17 2012 12/31/17 0637  BP:  138/73 119/63 123/64  Pulse:  73 77 74  Resp:  18 18 20   Temp:  98.6 F (37 C) 98.4 F (36.9 C) 97.9 F (36.6 C)  TempSrc:  Oral Oral Oral  SpO2:  93% 100% 95%  Weight: (!) 150.1 kg (331 lb)       Intake/Output Summary (Last 24 hours) at 12/31/2017 1109 Last data filed at 12/31/2017 0900 Gross per 24 hour  Intake 740 ml  Output 300 ml  Net 440 ml   Filed Weights   12/30/17 1200  Weight: (!) 150.1 kg (331 lb)   Exam  General: Well developed, chronically ill appearing, NAD  HEENT: NCAT, mucous membranes moist.   Cardiovascular: S1 S2  auscultated, no rubs, murmurs or gallops. Regular rate and rhythm.  Respiratory: Diminished, no wheezing appreciated. Occ nonproductive cough  Abdomen: Soft, obese, nontender, distended, + bowel sounds  Extremities: warm dry without cyanosis clubbing. LE edema B/L  Neuro: AAOx3, nonfocal  Skin: Without rashes exudates or nodules, skin changes on LE with wounds-scabbed  Psych: appropriate mod and affect   Data Reviewed: I have personally reviewed following labs and imaging studies  CBC: Recent Labs  Lab 12/28/17 1751 12/29/17 0628 12/30/17 0044 12/31/17 0804  WBC 16.0* 19.1* 15.0* 9.7  NEUTROABS 12.4*  --  12.1*  --   HGB 11.3* 9.4* 9.3* 9.8*  HCT 36.7* 31.0* 30.7* 32.6*  MCV 91.1 89.3 90.6 91.8  PLT 248 215 211 878   Basic Metabolic Panel: Recent Labs  Lab 12/28/17 1751 12/29/17 0628 12/30/17 0044 12/31/17 0804  NA 140 136 139 135  K 3.9 3.9 3.9 4.3  CL 102 102 106 103  CO2 21* 21* 20* 21*  GLUCOSE 127* 224* 74 235*  BUN 29*  34* 39* 28*  CREATININE 1.92* 2.00* 1.73* 1.52*  CALCIUM 9.2 8.4* 8.3* 8.2*  MG  --   --  2.1  --   PHOS  --   --  2.6  --    GFR: Estimated Creatinine Clearance: 62.6 mL/min (A) (by C-G formula based on SCr of 1.52 mg/dL (H)). Liver Function Tests: Recent Labs  Lab 12/28/17 1751 12/30/17 0044  AST 44* 48*  ALT 29 29  ALKPHOS 102 84  BILITOT 0.7 0.5  PROT 7.7 6.6  ALBUMIN 3.4* 2.7*   No results for input(s): LIPASE, AMYLASE in the last 168 hours. No results for input(s): AMMONIA in the last 168 hours. Coagulation Profile: No results for input(s): INR, PROTIME in the last 168 hours. Cardiac Enzymes: No results for input(s): CKTOTAL, CKMB, CKMBINDEX, TROPONINI in the last 168 hours. BNP (last 3 results) Recent Labs    10/26/17 1425  PROBNP 1,148*   HbA1C: No results for input(s): HGBA1C in the last 72 hours. CBG: Recent Labs  Lab 12/31/17 0159 12/31/17 0624 12/31/17 0651 12/31/17 0815 12/31/17 1027  GLUCAP 129*  58* 78 209* 239*   Lipid Profile: No results for input(s): CHOL, HDL, LDLCALC, TRIG, CHOLHDL, LDLDIRECT in the last 72 hours. Thyroid Function Tests: No results for input(s): TSH, T4TOTAL, FREET4, T3FREE, THYROIDAB in the last 72 hours. Anemia Panel: No results for input(s): VITAMINB12, FOLATE, FERRITIN, TIBC, IRON, RETICCTPCT in the last 72 hours. Urine analysis:    Component Value Date/Time   COLORURINE YELLOW 07/28/2017 1833   APPEARANCEUR CLEAR 07/28/2017 1833   LABSPEC 1.017 07/28/2017 1833   PHURINE 5.0 07/28/2017 1833   GLUCOSEU >=500 (A) 07/28/2017 1833   GLUCOSEU 100 09/30/2013 1535   HGBUR NEGATIVE 07/28/2017 1833   BILIRUBINUR NEGATIVE 07/28/2017 1833   KETONESUR NEGATIVE 07/28/2017 1833   PROTEINUR NEGATIVE 07/28/2017 1833   UROBILINOGEN 1.0 10/01/2013 1003   NITRITE NEGATIVE 07/28/2017 1833   LEUKOCYTESUR NEGATIVE 07/28/2017 1833   Sepsis Labs: @LABRCNTIP (procalcitonin:4,lacticidven:4)  ) Recent Results (from the past 240 hour(s))  Blood culture (routine x 2)     Status: Abnormal   Collection Time: 12/28/17  6:17 PM  Result Value Ref Range Status   Specimen Description BLOOD RIGHT ANTECUBITAL  Final   Special Requests   Final    BOTTLES DRAWN AEROBIC AND ANAEROBIC Blood Culture adequate volume   Culture  Setup Time   Final    GRAM POSITIVE COCCI IN CHAINS IN BOTH AEROBIC AND ANAEROBIC BOTTLES CRITICAL RESULT CALLED TO, READ BACK BY AND VERIFIED WITH: Sandria Bales [id number] MLM    Culture GROUP B STREP(S.AGALACTIAE)ISOLATED (A)  Final   Report Status 12/31/2017 FINAL  Final   Organism ID, Bacteria GROUP B STREP(S.AGALACTIAE)ISOLATED  Final      Susceptibility   Group b strep(s.agalactiae)isolated - MIC*    CLINDAMYCIN <=0.25 SENSITIVE Sensitive     AMPICILLIN <=0.25 SENSITIVE Sensitive     ERYTHROMYCIN <=0.12 SENSITIVE Sensitive     VANCOMYCIN 0.5 SENSITIVE Sensitive     CEFTRIAXONE <=0.12 SENSITIVE Sensitive     LEVOFLOXACIN 1 SENSITIVE  Sensitive     * GROUP B STREP(S.AGALACTIAE)ISOLATED  Blood culture (routine x 2)     Status: Abnormal   Collection Time: 12/28/17  6:17 PM  Result Value Ref Range Status   Specimen Description BLOOD BLOOD RIGHT FOREARM  Final   Special Requests   Final    BOTTLES DRAWN AEROBIC AND ANAEROBIC Blood Culture adequate volume   Culture  Setup Time  Final    GRAM POSITIVE COCCI IN CHAINS CRITICAL VALUE NOTED.  VALUE IS CONSISTENT WITH PREVIOUSLY REPORTED AND CALLED VALUE. IN BOTH AEROBIC AND ANAEROBIC BOTTLES    Culture (A)  Final    GROUP B STREP(S.AGALACTIAE)ISOLATED SUSCEPTIBILITIES PERFORMED ON PREVIOUS CULTURE WITHIN THE LAST 5 DAYS.    Report Status 12/31/2017 FINAL  Final  Blood Culture ID Panel (Reflexed)     Status: Abnormal   Collection Time: 12/28/17  6:17 PM  Result Value Ref Range Status   Enterococcus species NOT DETECTED NOT DETECTED Final   Listeria monocytogenes NOT DETECTED NOT DETECTED Final   Staphylococcus species NOT DETECTED NOT DETECTED Final   Staphylococcus aureus NOT DETECTED NOT DETECTED Final   Streptococcus species DETECTED (A) NOT DETECTED Final    Comment: CRITICAL RESULT CALLED TO, READ BACK BY AND VERIFIED WITH: PHARMD N BATCHELDER [id number] MLM    Streptococcus agalactiae DETECTED (A) NOT DETECTED Final    Comment: CRITICAL RESULT CALLED TO, READ BACK BY AND VERIFIED WITH: PHARMD N BATCHELDER [id number] MLM    Streptococcus pneumoniae NOT DETECTED NOT DETECTED Final   Streptococcus pyogenes NOT DETECTED NOT DETECTED Final   Acinetobacter baumannii NOT DETECTED NOT DETECTED Final   Enterobacteriaceae species NOT DETECTED NOT DETECTED Final   Enterobacter cloacae complex NOT DETECTED NOT DETECTED Final   Escherichia coli NOT DETECTED NOT DETECTED Final   Klebsiella oxytoca NOT DETECTED NOT DETECTED Final   Klebsiella pneumoniae NOT DETECTED NOT DETECTED Final   Proteus species NOT DETECTED NOT DETECTED Final   Serratia marcescens NOT DETECTED  NOT DETECTED Final   Haemophilus influenzae NOT DETECTED NOT DETECTED Final   Neisseria meningitidis NOT DETECTED NOT DETECTED Final   Pseudomonas aeruginosa NOT DETECTED NOT DETECTED Final   Candida albicans NOT DETECTED NOT DETECTED Final   Candida glabrata NOT DETECTED NOT DETECTED Final   Candida krusei NOT DETECTED NOT DETECTED Final   Candida parapsilosis NOT DETECTED NOT DETECTED Final   Candida tropicalis NOT DETECTED NOT DETECTED Final  Respiratory Panel by PCR     Status: Abnormal   Collection Time: 12/28/17  8:55 PM  Result Value Ref Range Status   Adenovirus NOT DETECTED NOT DETECTED Final   Coronavirus 229E DETECTED (A) NOT DETECTED Final   Coronavirus HKU1 NOT DETECTED NOT DETECTED Final   Coronavirus NL63 NOT DETECTED NOT DETECTED Final   Coronavirus OC43 NOT DETECTED NOT DETECTED Final   Metapneumovirus NOT DETECTED NOT DETECTED Final   Rhinovirus / Enterovirus NOT DETECTED NOT DETECTED Final   Influenza A NOT DETECTED NOT DETECTED Final   Influenza B NOT DETECTED NOT DETECTED Final   Parainfluenza Virus 1 NOT DETECTED NOT DETECTED Final   Parainfluenza Virus 2 NOT DETECTED NOT DETECTED Final   Parainfluenza Virus 3 NOT DETECTED NOT DETECTED Final   Parainfluenza Virus 4 NOT DETECTED NOT DETECTED Final   Respiratory Syncytial Virus NOT DETECTED NOT DETECTED Final   Bordetella pertussis NOT DETECTED NOT DETECTED Final   Chlamydophila pneumoniae NOT DETECTED NOT DETECTED Final   Mycoplasma pneumoniae NOT DETECTED NOT DETECTED Final  MRSA PCR Screening     Status: None   Collection Time: 12/29/17 11:15 AM  Result Value Ref Range Status   MRSA by PCR NEGATIVE NEGATIVE Final    Comment:        The GeneXpert MRSA Assay (FDA approved for NASAL specimens only), is one component of a comprehensive MRSA colonization surveillance program. It is not intended to diagnose MRSA infection nor to  guide or monitor treatment for MRSA infections.   Culture, blood (routine x 2)      Status: None (Preliminary result)   Collection Time: 12/30/17 12:42 AM  Result Value Ref Range Status   Specimen Description BLOOD LEFT ANTECUBITAL  Final   Special Requests   Final    BOTTLES DRAWN AEROBIC AND ANAEROBIC Blood Culture adequate volume   Culture NO GROWTH 1 DAY  Final   Report Status PENDING  Incomplete  Culture, blood (routine x 2)     Status: None (Preliminary result)   Collection Time: 12/30/17 12:48 AM  Result Value Ref Range Status   Specimen Description BLOOD LEFT HAND  Final   Special Requests   Final    BOTTLES DRAWN AEROBIC ONLY Blood Culture adequate volume   Culture NO GROWTH 1 DAY  Final   Report Status PENDING  Incomplete      Radiology Studies: Dg Chest Port 1 View  Result Date: 12/30/2017 CLINICAL DATA:  Shortness of Breath EXAM: PORTABLE CHEST 1 VIEW COMPARISON:  12/28/2017 FINDINGS: Cardiac shadow is enlarged. Pacing device is again seen and stable. Diffuse interstitial changes are again identified throughout both lungs stable from the prior exam. No new focal abnormality is seen. No bony abnormality is noted. IMPRESSION: Chronic changes without acute abnormality. Electronically Signed   By: Inez Catalina M.D.   On: 12/30/2017 08:25     Scheduled Meds: . acidophilus  1 capsule Oral Daily  . amiodarone  200 mg Oral Daily  . apixaban  5 mg Oral BID  . brimonidine  1 drop Left Eye TID  . cholecalciferol  1,000 Units Oral BID  . dorzolamide  1 drop Both Eyes BID  . guaiFENesin  1,200 mg Oral BID  . insulin regular  300 Units Subcutaneous Q breakfast  . insulin regular  200 Units Subcutaneous Q lunch  . insulin regular  220 Units Subcutaneous Q supper  . levothyroxine  75 mcg Oral QAC breakfast  . mometasone-formoterol  2 puff Inhalation BID  . multivitamin with minerals  1 tablet Oral Daily  . Netarsudil Dimesylate  1 drop Left Eye Daily  . omega-3 acid ethyl esters  1,000 mg Oral q morning - 10a  . pantoprazole  80 mg Oral Daily  .  rosuvastatin  20 mg Oral QPM  . timolol  1 drop Both Eyes BID  . Travoprost (BAK Free)  1 drop Left Eye QHS   Continuous Infusions: . sodium chloride 125 mL/hr at 12/29/17 1803  . pencillin G potassium IV 3 Million Units (12/31/17 0800)     LOS: 3 days   Time Spent in minutes   45 minutes  Misaki Sozio D.O. on 12/31/2017 at 11:09 AM  Between 7am to 7pm - Pager - [phone]  After 7pm go to [email or web address] - password [email or web address]  And look for the night coverage person covering for me after hours  [facility] Office  [phone]

## 2018-01-01 ENCOUNTER — Encounter (HOSPITAL_COMMUNITY)
Admission: EM | Disposition: A | Discharge status: Home Health | Payer: Self-pay | Source: Home / Self Care | Attending: Internal Medicine

## 2018-01-01 ENCOUNTER — Other Ambulatory Visit: Payer: Self-pay

## 2018-01-01 ENCOUNTER — Other Ambulatory Visit (HOSPITAL_COMMUNITY): Payer: Medicare HMO

## 2018-01-01 DIAGNOSIS — L97929 Non-pressure chronic ulcer of unspecified part of left lower leg with unspecified severity: Secondary | ICD-10-CM

## 2018-01-01 DIAGNOSIS — Z950 Presence of cardiac pacemaker: Secondary | ICD-10-CM

## 2018-01-01 DIAGNOSIS — H42 Glaucoma in diseases classified elsewhere: Secondary | ICD-10-CM

## 2018-01-01 DIAGNOSIS — E1122 Type 2 diabetes mellitus with diabetic chronic kidney disease: Secondary | ICD-10-CM

## 2018-01-01 DIAGNOSIS — Z794 Long term (current) use of insulin: Secondary | ICD-10-CM

## 2018-01-01 DIAGNOSIS — I4891 Unspecified atrial fibrillation: Secondary | ICD-10-CM

## 2018-01-01 DIAGNOSIS — R7881 Bacteremia: Secondary | ICD-10-CM

## 2018-01-01 DIAGNOSIS — J069 Acute upper respiratory infection, unspecified: Secondary | ICD-10-CM

## 2018-01-01 DIAGNOSIS — E1139 Type 2 diabetes mellitus with other diabetic ophthalmic complication: Secondary | ICD-10-CM

## 2018-01-01 DIAGNOSIS — B954 Other streptococcus as the cause of diseases classified elsewhere: Secondary | ICD-10-CM

## 2018-01-01 DIAGNOSIS — Z888 Allergy status to other drugs, medicaments and biological substances status: Secondary | ICD-10-CM

## 2018-01-01 DIAGNOSIS — Z7901 Long term (current) use of anticoagulants: Secondary | ICD-10-CM

## 2018-01-01 DIAGNOSIS — L97919 Non-pressure chronic ulcer of unspecified part of right lower leg with unspecified severity: Secondary | ICD-10-CM

## 2018-01-01 DIAGNOSIS — Z86718 Personal history of other venous thrombosis and embolism: Secondary | ICD-10-CM

## 2018-01-01 DIAGNOSIS — R040 Epistaxis: Secondary | ICD-10-CM

## 2018-01-01 DIAGNOSIS — E11622 Type 2 diabetes mellitus with other skin ulcer: Secondary | ICD-10-CM

## 2018-01-01 DIAGNOSIS — B951 Streptococcus, group B, as the cause of diseases classified elsewhere: Secondary | ICD-10-CM

## 2018-01-01 DIAGNOSIS — Z87891 Personal history of nicotine dependence: Secondary | ICD-10-CM

## 2018-01-01 LAB — GLUCOSE, CAPILLARY
GLUCOSE-CAPILLARY: 67 mg/dL (ref 65–99)
GLUCOSE-CAPILLARY: 69 mg/dL (ref 65–99)
GLUCOSE-CAPILLARY: 90 mg/dL (ref 65–99)
GLUCOSE-CAPILLARY: 74 mg/dL (ref 65–99)
GLUCOSE-CAPILLARY: 67 mg/dL (ref 65–99)
GLUCOSE-CAPILLARY: 300 mg/dL — AB (ref 65–99)
Glucose-Capillary: 62 mg/dL — ABNORMAL LOW (ref 65–99)
Glucose-Capillary: 94 mg/dL (ref 65–99)
Glucose-Capillary: 76 mg/dL (ref 65–99)
Glucose-Capillary: 56 mg/dL — ABNORMAL LOW (ref 65–99)
Glucose-Capillary: 128 mg/dL — ABNORMAL HIGH (ref 65–99)
Glucose-Capillary: 326 mg/dL — ABNORMAL HIGH (ref 65–99)

## 2018-01-01 LAB — CBC
HCT: 33.8 % — ABNORMAL LOW (ref 39.0–52.0)
Hemoglobin: 10.4 g/dL — ABNORMAL LOW (ref 13.0–17.0)
MCH: 28.3 pg (ref 26.0–34.0)
MCHC: 30.8 g/dL (ref 30.0–36.0)
MCV: 92.1 fL (ref 78.0–100.0)
PLATELETS: 230 10*3/uL (ref 150–400)
RBC: 3.67 MIL/uL — ABNORMAL LOW (ref 4.22–5.81)
RDW: 17.4 % — ABNORMAL HIGH (ref 11.5–15.5)
WBC: 8.4 10*3/uL (ref 4.0–10.5)

## 2018-01-01 LAB — BASIC METABOLIC PANEL
Anion gap: 11 (ref 5–15)
BUN: 29 mg/dL — AB (ref 6–20)
CO2: 23 mmol/L (ref 22–32)
CREATININE: 1.48 mg/dL — AB (ref 0.61–1.24)
Calcium: 8.8 mg/dL — ABNORMAL LOW (ref 8.9–10.3)
Chloride: 105 mmol/L (ref 101–111)
GFR, EST AFRICAN AMERICAN: 52 mL/min — AB (ref >60)
GFR, EST NON AFRICAN AMERICAN: 45 mL/min — AB (ref >60)
Glucose, Bld: 94 mg/dL (ref 65–99)
Potassium: 4.3 mmol/L (ref 3.5–5.1)
SODIUM: 139 mmol/L (ref 135–145)

## 2018-01-01 LAB — RNA QUALITATIVE

## 2018-01-01 LAB — HIV 1/2 AB DIFFERENTIATION
HIV 1 Ab: NEGATIVE
HIV 2 Ab: NEGATIVE
NOTE (HIV CONF MULTISPOT): NEGATIVE

## 2018-01-01 LAB — HIV ANTIBODY (ROUTINE TESTING W REFLEX): HIV SCREEN 4TH GENERATION: REACTIVE — AB

## 2018-01-01 SURGERY — ECHOCARDIOGRAM, TRANSESOPHAGEAL
Anesthesia: Monitor Anesthesia Care

## 2018-01-01 MED ORDER — DEXTROSE 5 % IV SOLN
INTRAVENOUS | Status: DC
  Administered 2018-01-01: 50 mL via INTRAVENOUS

## 2018-01-01 MED ORDER — PENICILLIN G POTASSIUM 20000000 UNITS IJ SOLR
3.0000 10*6.[IU] | INTRAVENOUS | Status: DC
  Administered 2018-01-01 – 2018-01-02 (×5): 3 10*6.[IU] via INTRAVENOUS
  Filled 2018-01-01 (×8): qty 3

## 2018-01-01 MED ORDER — DEXTROSE 50 % IV SOLN
INTRAVENOUS | Status: AC
  Administered 2018-01-01: 25 mL
  Filled 2018-01-01: qty 50

## 2018-01-01 NOTE — Progress Notes (Signed)
Pt had hypoglycemic event at 0230. He had no symptoms. Stated this occurs often and usually responds to sugar. He is NPO so 25 ml of dextrose 50% given with follow up cbg of 94. MD notified.

## 2018-01-01 NOTE — Progress Notes (Signed)
cbg at 0630 was 76. Pt receiving D5 at 50cc. Is asymptomatic. Sleeping. O2 sats at 100% on 4 L [place]. MD on call notified.

## 2018-01-01 NOTE — Progress Notes (Addendum)
Pt's 0400 cbg was 69. Dr.bodenheimer notified. Changed fluids to D5 at 50cc/hr. Pt asymptomatic. Follow cbg was 90.

## 2018-01-01 NOTE — Progress Notes (Signed)
Inpatient Diabetes Program Recommendations  AACE/ADA: New Consensus Statement on Inpatient Glycemic Control (2015)  Target Ranges:  Prepandial:   less than 140 mg/dL      Peak postprandial:   less than 180 mg/dL (1-2 hours)      Critically ill patients:  140 - 180 mg/dL    Results for [name], [name] (MRN [id number]) as of 01/01/2018 13:35  Ref. Range 12/31/2017 01:33 12/31/2017 01:59 12/31/2017 06:24 12/31/2017 06:51 12/31/2017 08:15 12/31/2017 10:27 12/31/2017 12:13 12/31/2017 14:39 12/31/2017 15:57 12/31/2017 17:57 12/31/2017 20:02 12/31/2017 22:04  Glucose-Capillary Latest Ref Range: 65 - 99 mg/dL 25 (LL) 129 (H) 58 (L) 78 209 (H) 239 (H) 220 (H) 291 (H) 327 (H) 368 (H)  100 units Regular Insulin 358 (H)  25 units Novolog 256 (H)  10 units Novolog   Results for [name], [name] (MRN [id number]) as of 01/01/2018 13:35  Ref. Range 01/01/2018 00:03 01/01/2018 02:22 01/01/2018 02:44 01/01/2018 03:07 01/01/2018 04:08 01/01/2018 04:51 01/01/2018 06:40 01/01/2018 08:03 01/01/2018 08:28 01/01/2018 11:13 01/01/2018 12:01  Glucose-Capillary Latest Ref Range: 65 - 99 mg/dL 148 (H) 67 62 (L) 94 69 90 76 56 (L) 74 67 128 (H)    Home DM Meds: Regular Insulin- 300 units with Breakfast/ 200 units with Lunch/ 200 units with Dinner  Current Insulin Orders: Regular Insulin- 300 units with Breakfast         Regular Insulin- 200 units with Lunch        Regular Insulin- 100 units with Dinner     Note patient had multiple episodes of Hypoglycemia yesterday morning followed by rebound Hyperglycemia.  Regular Insulin not given until last PM along with two doses of Novolog.  Patient again with Hypoglycemia this AM.  Was NPO earlier this AM.  I am unsure what to recommend at this point?  Note that Dr. Ree Kida spoke with patient's Endocrinologist ([name]) yesterday.  Extremely large doses of Regular insulin seems like a very unusual insulin regimen and is not ideal for inpatient use since patient may be NPO for  procedures.  Perhaps it would be more beneficial to use a basal/bolus insulin regimen for patient in-hospital (weight based dosing) and then have pt resume his Regular insulin doses at time of d/c??  If you decide to switch to basal/ bolus, recommend 0.4 units/kg for basal insulin, Novolog Resistant SSI, and Novolog Meal Coverage (could start with 25 units TID with meals)     Note DM Coordinator met with patient yesterday.  See below for details of conversation:  "States that he has been on insulin for 30 years.  Has tried Lantus, Levemir, and U-500 insulin before. States that he usually takes up to 910 units of Novolin Regular daily total.  He is not able to use U-500 because his insurance does not cover it. States that he is eating less here at the hospital than he eats at home, so blood sugars are difficult to control. States also that he would rather have his blood sugars 200 mg/dl rather than less than 100 mg/dl.   Patient uses a similar sliding scale at home for his insulin dosages: (according to blood sugars before each meal and at bedtime)  Given with each meal and bedtime at home: CBG 100-150   No insulin CBG 150-200  20-30 units of Novolin Regular CBG 200-250   50 units  Regular CBG 250-300   70-80 units Regular CBG 350-400   100-150 units Regular CBG 450-500    200 units  Regular CBG  >500    200 units of Regular,  then wait 30 minutes and check CBG again.                     May give more if needed."     --Will follow patient during hospitalization--  Wyn Quaker RN, MSN, CDE Diabetes Coordinator Inpatient Glycemic Control Team Team Pager: [phone] (8a-5p)

## 2018-01-01 NOTE — Progress Notes (Signed)
PROGRESS NOTE    [name]  YTK:[id number] DOB: Sep 01, 1941 DOA: 12/28/2017 PCP: [name] Shin, MD   Chief Complaint  Patient presents with  . COPD  . Shortness of Breath  . Groin Pain    Brief Narrative:  HPI On 12/28/2017 by Dr. Jennette [name] [name] is a 77 y.o. male with medical history significant of COPD, DM2 with CKD stage 3 currently requiring 910 units of insulin / day treatment by Dr. Renato [name], glaucoma on 5 eye drops, SSS with pacer, h/o DVT, A.Fib on eliquis.  Patient presents to the ED with c/o cough, intermittent L leg / groin pain.  No wheezing, no sputum production, no CP, no vomiting, no abd pain, no sore throat.  Symptoms onset Friday, worsened over weekend.  Interim history Found to have strep bacteremia and coronavirus. Being treated for CAP.  Assessment & Plan   Sepsis secondary to strep bacteremia -Presented with fever, leukocytosis -Possibly secondary to ulcerations on the lower extremities -blood cultures 2/2 Strep group B -Repeat blood cultures on 12/30/2017, show no growth to date -Echocardiogram ordered, however patient unable to tolerate study due to positioning  -Discussed with ID, recommended TEE, however, patient is high risk for decompensation given his sleep apnea -Continue penicillin -infectious disease consulted and appreciated  Upper respiratory infection from coronavirus -Respiratory viral panel positive for coronavirus -Influenza PCR negative -Continue to layer, and treatments, Delsym and Mucinex -Strep pneumonia urine antigen negative -Chest x-ray reviewed, no infection noted, chronic changes -continue nasal spray for nasal congestion  COPD -Continue nebulizer treatments  Chronic kidney disease, stage III -Creatinine currently 1.48 and stable -continue to monitor BMP  Diabetes mellitus, type II, uncontrolled, concave by episodes of hypoglycemia -Overnight, patient's CBGs dropped again -He is on approximately 900  units of insulin per day at home -Patient follows with Dr. Loanne [name] -Discussed with Dr. Loanne [name], patient does not need long acting insulin due to his drops overnight. Recommended discontinuing nightly insulin dose, and have patient follow up with him after discharge.  Sick sinus syndrome/history of atrial fibrillation -Continue amiodarone, Eliquis  Lower extremity skin changes/possible wounds -Wound care consulted and appreciated  Normocytic anemia -Drop thought to be possibly dilutional, as patient's Lasix has been held -hemoglobin today 10.4 and appears to be stable (baseline approximately 11) -Continue to monitor CBC  Glaucoma -Continue Netasurdil Dimesylate, Dorzolamide, Brimodine, Timolol, and Travoprost   Hyperlipidemia -Continue statin, omega-3's  GERD -Continue PPI  Chronic diastolic heart failure -Patient was given IV fluid on admission, continue to monitor volume status  Leg/groin pain -History of blood clot -LE doppler: Difficult exam due to patient's body habitus, breathing treatment composition and pitting edema. No evidence of DVT in the visualized veins. Negative for Baker's cyst on the left  Hypothyroidism -Continue Synthroid  DVT Prophylaxis  Eliquis  Code Status: DNR  Family Communication: None at bedside  Disposition Plan: Admitted. Continue treatment of sepsis/infection  Consultants Dr. Loanne [name], endocrinology, via phone Cardiology (for possible TEE) Infectious disease  Procedures  LLE doppler  Antibiotics   Anti-infectives (From admission, onward)   Start     Dose/Rate Route Frequency Ordered Stop   12/31/17 1100  penicillin G potassium 3 Million Units in dextrose 60mL IVPB  Status:  Discontinued     3 Million Units 100 mL/hr over 30 Minutes Intravenous Every 4 hours 12/31/17 0840 12/31/17 0840   12/30/17 1030  penicillin G potassium 3 Million Units in dextrose 20mL IVPB     3 Million Units 100  mL/hr over 30 Minutes Intravenous Every 4  hours 12/30/17 0803     12/29/17 1800  cefTRIAXone (ROCEPHIN) 1 g in dextrose 5 % 50 mL IVPB  Status:  Discontinued     1 g 100 mL/hr over 30 Minutes Intravenous Every 24 hours 12/28/17 1946 12/29/17 0945   12/29/17 1800  azithromycin (ZITHROMAX) tablet 500 mg  Status:  Discontinued     500 mg Oral Every 24 hours 12/28/17 1946 12/29/17 1606   12/29/17 1700  penicillin G potassium injection 3 Million Units  Status:  Discontinued     3 Million Units Intramuscular Every 4 hours 12/29/17 1609 12/29/17 1627   12/29/17 1700  penicillin G potassium 3 Million Units in dextrose 2mL IVPB  Status:  Discontinued     3 Million Units 100 mL/hr over 30 Minutes Intravenous Every 4 hours 12/29/17 1629 12/30/17 0803   12/29/17 1200  cefTRIAXone (ROCEPHIN) 2 g in dextrose 5 % 50 mL IVPB  Status:  Discontinued     2 g 100 mL/hr over 30 Minutes Intravenous Every 24 hours 12/29/17 0945 12/29/17 1606   12/28/17 1830  cefTRIAXone (ROCEPHIN) 1 g in dextrose 5 % 50 mL IVPB     1 g 100 mL/hr over 30 Minutes Intravenous  Once 12/28/17 1828 12/28/17 2217   12/28/17 1830  azithromycin (ZITHROMAX) 500 mg in dextrose 5 % 250 mL IVPB     500 mg 250 mL/hr over 60 Minutes Intravenous  Once 12/28/17 1828 12/29/17 0017      Subjective:   [name] seen and examined today.  Complains of nasal congestion. Denies current chest pain, abdominal pain, N/V/D/C. Feels breathing is mildly better but not back to his baseline and continues to cough.   Objective:   Vitals:   12/31/17 2300 01/01/18 0000 01/01/18 0402 01/01/18 0600  BP:   (!) 131/98   Pulse:   73   Resp:      Temp:   98.2 F (36.8 C)   TempSrc:   Oral   SpO2: 98%  100% 100%  Weight:  (!) 150.3 kg (331 lb 6.4 oz)    Height:  5\' 10"  (1.778 m)      Intake/Output Summary (Last 24 hours) at 01/01/2018 1416 Last data filed at 01/01/2018 1300 Gross per 24 hour  Intake 1180 ml  Output 900 ml  Net 280 ml   Filed Weights   12/30/17 1200 01/01/18 0000    Weight: (!) 150.1 kg (331 lb) (!) 150.3 kg (331 lb 6.4 oz)   Exam  General: Well developed, chronically ill appearing, NAD  HEENT: NCAT, mucous membranes moist.   Cardiovascular: S1 S2 auscultated, RRR, no murmurs.  Respiratory: Diminished breath sounds  Abdomen: Soft, obese, nontender, distended, + bowel sounds  Extremities: warm dry without cyanosis clubbing. LE edema B/L  Neuro: AAOx3, nonfocal  Psych: Pleasant, appropriate mood and affect   Data Reviewed: I have personally reviewed following labs and imaging studies  CBC: Recent Labs  Lab 12/28/17 1751 12/29/17 0628 12/30/17 0044 12/31/17 0804 01/01/18 0324  WBC 16.0* 19.1* 15.0* 9.7 8.4  NEUTROABS 12.4*  --  12.1*  --   --   HGB 11.3* 9.4* 9.3* 9.8* 10.4*  HCT 36.7* 31.0* 30.7* 32.6* 33.8*  MCV 91.1 89.3 90.6 91.8 92.1  PLT 248 215 211 220 188   Basic Metabolic Panel: Recent Labs  Lab 12/28/17 1751 12/29/17 0628 12/30/17 0044 12/31/17 0804 01/01/18 0324  NA 140 136 139 135 139  K  3.9 3.9 3.9 4.3 4.3  CL 102 102 106 103 105  CO2 21* 21* 20* 21* 23  GLUCOSE 127* 224* 74 235* 94  BUN 29* 34* 39* 28* 29*  CREATININE 1.92* 2.00* 1.73* 1.52* 1.48*  CALCIUM 9.2 8.4* 8.3* 8.2* 8.8*  MG  --   --  2.1  --   --   PHOS  --   --  2.6  --   --    GFR: Estimated Creatinine Clearance: 64.4 mL/min (A) (by C-G formula based on SCr of 1.48 mg/dL (H)). Liver Function Tests: Recent Labs  Lab 12/28/17 1751 12/30/17 0044  AST 44* 48*  ALT 29 29  ALKPHOS 102 84  BILITOT 0.7 0.5  PROT 7.7 6.6  ALBUMIN 3.4* 2.7*   No results for input(s): LIPASE, AMYLASE in the last 168 hours. No results for input(s): AMMONIA in the last 168 hours. Coagulation Profile: No results for input(s): INR, PROTIME in the last 168 hours. Cardiac Enzymes: No results for input(s): CKTOTAL, CKMB, CKMBINDEX, TROPONINI in the last 168 hours. BNP (last 3 results) Recent Labs    10/26/17 1425  PROBNP 1,148*   HbA1C: No results for  input(s): HGBA1C in the last 72 hours. CBG: Recent Labs  Lab 01/01/18 0640 01/01/18 0803 01/01/18 0828 01/01/18 1113 01/01/18 1201  GLUCAP 76 56* 74 67 128*   Lipid Profile: No results for input(s): CHOL, HDL, LDLCALC, TRIG, CHOLHDL, LDLDIRECT in the last 72 hours. Thyroid Function Tests: No results for input(s): TSH, T4TOTAL, FREET4, T3FREE, THYROIDAB in the last 72 hours. Anemia Panel: No results for input(s): VITAMINB12, FOLATE, FERRITIN, TIBC, IRON, RETICCTPCT in the last 72 hours. Urine analysis:    Component Value Date/Time   COLORURINE YELLOW 07/28/2017 1833   APPEARANCEUR CLEAR 07/28/2017 1833   LABSPEC 1.017 07/28/2017 1833   PHURINE 5.0 07/28/2017 1833   GLUCOSEU >=500 (A) 07/28/2017 1833   GLUCOSEU 100 09/30/2013 1535   HGBUR NEGATIVE 07/28/2017 1833   BILIRUBINUR NEGATIVE 07/28/2017 1833   KETONESUR NEGATIVE 07/28/2017 1833   PROTEINUR NEGATIVE 07/28/2017 1833   UROBILINOGEN 1.0 10/01/2013 1003   NITRITE NEGATIVE 07/28/2017 1833   LEUKOCYTESUR NEGATIVE 07/28/2017 1833   Sepsis Labs: @LABRCNTIP (procalcitonin:4,lacticidven:4)  ) Recent Results (from the past 240 hour(s))  Blood culture (routine x 2)     Status: Abnormal   Collection Time: 12/28/17  6:17 PM  Result Value Ref Range Status   Specimen Description BLOOD RIGHT ANTECUBITAL  Final   Special Requests   Final    BOTTLES DRAWN AEROBIC AND ANAEROBIC Blood Culture adequate volume   Culture  Setup Time   Final    GRAM POSITIVE COCCI IN CHAINS IN BOTH AEROBIC AND ANAEROBIC BOTTLES CRITICAL RESULT CALLED TO, READ BACK BY AND VERIFIED WITH: Sandria Bales [id number] MLM    Culture GROUP B STREP(S.AGALACTIAE)ISOLATED (A)  Final   Report Status 12/31/2017 FINAL  Final   Organism ID, Bacteria GROUP B STREP(S.AGALACTIAE)ISOLATED  Final      Susceptibility   Group b strep(s.agalactiae)isolated - MIC*    CLINDAMYCIN <=0.25 SENSITIVE Sensitive     AMPICILLIN <=0.25 SENSITIVE Sensitive     ERYTHROMYCIN  <=0.12 SENSITIVE Sensitive     VANCOMYCIN 0.5 SENSITIVE Sensitive     CEFTRIAXONE <=0.12 SENSITIVE Sensitive     LEVOFLOXACIN 1 SENSITIVE Sensitive     * GROUP B STREP(S.AGALACTIAE)ISOLATED  Blood culture (routine x 2)     Status: Abnormal   Collection Time: 12/28/17  6:17 PM  Result Value Ref  Range Status   Specimen Description BLOOD BLOOD RIGHT FOREARM  Final   Special Requests   Final    BOTTLES DRAWN AEROBIC AND ANAEROBIC Blood Culture adequate volume   Culture  Setup Time   Final    GRAM POSITIVE COCCI IN CHAINS CRITICAL VALUE NOTED.  VALUE IS CONSISTENT WITH PREVIOUSLY REPORTED AND CALLED VALUE. IN BOTH AEROBIC AND ANAEROBIC BOTTLES    Culture (A)  Final    GROUP B STREP(S.AGALACTIAE)ISOLATED SUSCEPTIBILITIES PERFORMED ON PREVIOUS CULTURE WITHIN THE LAST 5 DAYS.    Report Status 12/31/2017 FINAL  Final  Blood Culture ID Panel (Reflexed)     Status: Abnormal   Collection Time: 12/28/17  6:17 PM  Result Value Ref Range Status   Enterococcus species NOT DETECTED NOT DETECTED Final   Listeria monocytogenes NOT DETECTED NOT DETECTED Final   Staphylococcus species NOT DETECTED NOT DETECTED Final   Staphylococcus aureus NOT DETECTED NOT DETECTED Final   Streptococcus species DETECTED (A) NOT DETECTED Final    Comment: CRITICAL RESULT CALLED TO, READ BACK BY AND VERIFIED WITH: PHARMD N BATCHELDER [id number] MLM    Streptococcus agalactiae DETECTED (A) NOT DETECTED Final    Comment: CRITICAL RESULT CALLED TO, READ BACK BY AND VERIFIED WITH: PHARMD N BATCHELDER [id number] MLM    Streptococcus pneumoniae NOT DETECTED NOT DETECTED Final   Streptococcus pyogenes NOT DETECTED NOT DETECTED Final   Acinetobacter baumannii NOT DETECTED NOT DETECTED Final   Enterobacteriaceae species NOT DETECTED NOT DETECTED Final   Enterobacter cloacae complex NOT DETECTED NOT DETECTED Final   Escherichia coli NOT DETECTED NOT DETECTED Final   Klebsiella oxytoca NOT DETECTED NOT DETECTED Final    Klebsiella pneumoniae NOT DETECTED NOT DETECTED Final   Proteus species NOT DETECTED NOT DETECTED Final   Serratia marcescens NOT DETECTED NOT DETECTED Final   Haemophilus influenzae NOT DETECTED NOT DETECTED Final   Neisseria meningitidis NOT DETECTED NOT DETECTED Final   Pseudomonas aeruginosa NOT DETECTED NOT DETECTED Final   Candida albicans NOT DETECTED NOT DETECTED Final   Candida glabrata NOT DETECTED NOT DETECTED Final   Candida krusei NOT DETECTED NOT DETECTED Final   Candida parapsilosis NOT DETECTED NOT DETECTED Final   Candida tropicalis NOT DETECTED NOT DETECTED Final  Respiratory Panel by PCR     Status: Abnormal   Collection Time: 12/28/17  8:55 PM  Result Value Ref Range Status   Adenovirus NOT DETECTED NOT DETECTED Final   Coronavirus 229E DETECTED (A) NOT DETECTED Final   Coronavirus HKU1 NOT DETECTED NOT DETECTED Final   Coronavirus NL63 NOT DETECTED NOT DETECTED Final   Coronavirus OC43 NOT DETECTED NOT DETECTED Final   Metapneumovirus NOT DETECTED NOT DETECTED Final   Rhinovirus / Enterovirus NOT DETECTED NOT DETECTED Final   Influenza A NOT DETECTED NOT DETECTED Final   Influenza B NOT DETECTED NOT DETECTED Final   Parainfluenza Virus 1 NOT DETECTED NOT DETECTED Final   Parainfluenza Virus 2 NOT DETECTED NOT DETECTED Final   Parainfluenza Virus 3 NOT DETECTED NOT DETECTED Final   Parainfluenza Virus 4 NOT DETECTED NOT DETECTED Final   Respiratory Syncytial Virus NOT DETECTED NOT DETECTED Final   Bordetella pertussis NOT DETECTED NOT DETECTED Final   Chlamydophila pneumoniae NOT DETECTED NOT DETECTED Final   Mycoplasma pneumoniae NOT DETECTED NOT DETECTED Final  MRSA PCR Screening     Status: None   Collection Time: 12/29/17 11:15 AM  Result Value Ref Range Status   MRSA by PCR NEGATIVE NEGATIVE Final  Comment:        The GeneXpert MRSA Assay (FDA approved for NASAL specimens only), is one component of a comprehensive MRSA colonization surveillance  program. It is not intended to diagnose MRSA infection nor to guide or monitor treatment for MRSA infections.   Culture, blood (routine x 2)     Status: None (Preliminary result)   Collection Time: 12/30/17 12:42 AM  Result Value Ref Range Status   Specimen Description BLOOD LEFT ANTECUBITAL  Final   Special Requests   Final    BOTTLES DRAWN AEROBIC AND ANAEROBIC Blood Culture adequate volume   Culture NO GROWTH 2 DAYS  Final   Report Status PENDING  Incomplete  Culture, blood (routine x 2)     Status: None (Preliminary result)   Collection Time: 12/30/17 12:48 AM  Result Value Ref Range Status   Specimen Description BLOOD LEFT HAND  Final   Special Requests   Final    BOTTLES DRAWN AEROBIC ONLY Blood Culture adequate volume   Culture NO GROWTH 2 DAYS  Final   Report Status PENDING  Incomplete      Radiology Studies: No results found.   Scheduled Meds: . acidophilus  1 capsule Oral Daily  . amiodarone  200 mg Oral Daily  . apixaban  5 mg Oral BID  . brimonidine  1 drop Left Eye TID  . cholecalciferol  1,000 Units Oral BID  . dorzolamide  1 drop Both Eyes BID  . guaiFENesin  1,200 mg Oral BID  . insulin regular  300 Units Subcutaneous Q breakfast  . insulin regular  200 Units Subcutaneous Q lunch  . insulin regular  100 Units Subcutaneous Q supper  . levothyroxine  75 mcg Oral QAC breakfast  . mometasone-formoterol  2 puff Inhalation BID  . multivitamin with minerals  1 tablet Oral Daily  . Netarsudil Dimesylate  1 drop Left Eye Daily  . omega-3 acid ethyl esters  1,000 mg Oral q morning - 10a  . pantoprazole  80 mg Oral Daily  . rosuvastatin  20 mg Oral QPM  . timolol  1 drop Both Eyes BID  . Travoprost (BAK Free)  1 drop Left Eye QHS   Continuous Infusions: . sodium chloride    . pencillin G potassium IV Stopped (01/01/18 0500)     LOS: 4 days   Time Spent in minutes   45 minutes  Ryzen Deady D.O. on 01/01/2018 at 2:16 PM  Between 7am to 7pm - Pager -  [phone]  After 7pm go to [email or web address] - password [email or web address]  And look for the night coverage person covering for me after hours  [facility] Office  [phone]

## 2018-01-01 NOTE — Consult Note (Signed)
[name] for Infectious Disease   Reason for Consult: Strep agalactiae bacteremia     Referring Physician: Dr. Ree Kida    . acidophilus  1 capsule Oral Daily  . amiodarone  200 mg Oral Daily  . apixaban  5 mg Oral BID  . brimonidine  1 drop Left Eye TID  . cholecalciferol  1,000 Units Oral BID  . dorzolamide  1 drop Both Eyes BID  . guaiFENesin  1,200 mg Oral BID  . insulin regular  300 Units Subcutaneous Q breakfast  . insulin regular  200 Units Subcutaneous Q lunch  . insulin regular  100 Units Subcutaneous Q supper  . levothyroxine  75 mcg Oral QAC breakfast  . mometasone-formoterol  2 puff Inhalation BID  . multivitamin with minerals  1 tablet Oral Daily  . Netarsudil Dimesylate  1 drop Left Eye Daily  . omega-3 acid ethyl esters  1,000 mg Oral q morning - 10a  . pantoprazole  80 mg Oral Daily  . rosuvastatin  20 mg Oral QPM  . timolol  1 drop Both Eyes BID  . Travoprost (BAK Free)  1 drop Left Eye QHS    Recommendations: 6 weeks of IV penicillin G, will need PICC line. Plan for outpatient ID follow up in 3-4 weeks, will arrange this. Due to his pacemaker and inability to tolerate TEE, patient will also need repeat blood cultures after stopping antibiotics. If persistently positive, will need to follow up with EP and possible have pacer removed or replaced.   Assessment: 77 yo M with COPD, CKD III, DM, afib, who presented with cough and SOB secondary to URI. Found to have group B strep bacteremia, possibly secondary to lower extremity ulcers. Unable to obtain TEE due to inability to lay flat.   Antibiotics: 1/21 IV Azithro x 1 dose, dc'd  1/21 IV Ceftriaxone x 2 doses, dc'd  1/22 IV Penicillin G >>   Cultures: 1/21 Blood cultures >> 2/2 Group B Strep (S. Agalactiae)  1/21 RVP >> Coronavirus  1/23 Blood cultures >> No growth x 1 day   HPI: [name] is a 77 y.o. male with a history significant for COPD, CKD III, DM type II requiring 910 units of insulin a  day, glaucoma on 5 eye drops, SSS with pacer, hx of DVT, and afib on eliquis who presented to the ED on 1/21 with worsening cough and SOB x 2 days. On arrival to the ED, patient was febrile to 102.5 but hemodynamically stable. CXR was negative. WBC was 16, Creatinine 1.92, lactic acid 4.5. Patient was admitted and started on empiric azithromycin and ceftriaxone. Blood cultures collected on admission grew group B strep. Antibiotics were changed to IV Pen G q4h. Respiratory viral panel was positive for coronavirus.   Review of Systems: Review of Systems  Constitutional: Negative for chills and fever.  HENT: Positive for nosebleeds.   Respiratory: Positive for cough.   Cardiovascular: Positive for orthopnea and leg swelling. Negative for chest pain.  Gastrointestinal: Negative for abdominal pain.  Musculoskeletal: Negative for myalgias.  Neurological: Negative for dizziness.  Psychiatric/Behavioral: Negative for depression.   All other systems reviewed and are negative    Past Medical History:  Diagnosis Date  . Anemia    hx of  . Anxiety   . CAD (coronary artery disease)   . Cancer [facility]) 2014   bladder cancer AND RIGHT URETERAL CANCER  . COPD (chronic obstructive pulmonary disease) (HCC)    CPAP  .  Depression   . Diabetes mellitus    TypeII  . Diastolic dysfunction with chronic heart failure ([place])   . Gait difficulty    slow gait"ambulates with walker"  . GERD (gastroesophageal reflux disease)   . 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 H/O Legionnaire's disease 2003  . History of blood clots    R groin  . History of kidney stones   . Hyperkalemia   . Hyperlipemia   . Hypertensive cardiovascular-renal disease   . Hypothyroidism   . Morbid obesity ([place])   . OSA on CPAP    using CPAP although it is hard for him to tolerate  . Osteoarthritis    fingers  . Pancreatitis   . Peripheral neuropathy   . Persistent atrial fibrillation ([place])   . Pneumonia 2003  . Renal  insufficiency   . Shortness of breath dyspnea    with exertion  . Sick sinus syndrome ([place]) 06/2010   s/p PPM by [employer]  . Venous insufficiency     Social History   Tobacco Use  . Smoking status: Former Smoker    Packs/day: 2.00    Years: 50.00    Pack years: 100.00    Types: Cigarettes    Last attempt to quit: 12/08/2006    Years since quitting: 11.0  . Smokeless tobacco: Never Used  Substance Use Topics  . Alcohol use: No    Alcohol/week: 0.0 oz    Comment: quit 3 years ago  . Drug use: No    Family History  Problem Relation Age of Onset  . Liver cancer Mother        deceased age 71  . Cancer Mother        liver cancer  . Heart attack Father   . Colon cancer Neg Hx     Allergies  Allergen Reactions  . Actos [Pioglitazone] Swelling  . Timolol Other (See Comments)    Slow heart rate but tolerates Cosopt (dorzolamide-timolol)    Physical Exam:  Vitals:   01/01/18 0402 01/01/18 0600  BP: (!) 131/98   Pulse: 73   Resp:    Temp: 98.2 F (36.8 C)   SpO2: 100% 100%   Constitutional: Obese, NAD, appears comfortable HEENT: Atraumatic, normocephalic. PERRL, anicteric sclera.  Neck: Supple, trachea midline.  Cardiovascular: Distant heart sounds but RRR, no murmurs, rubs, or gallops.  Pulmonary/Chest: CTAB anteriorly, no wheezes, rales, or rhonchi.  Abdominal: Distended due to habitus but soft, non tender.+BS.  Extremities: Warm and well perfused. Woody, pitting edema bilaterally with ulcerations bandaged  Neurological: A&Ox3, CN II - XII grossly intact.  Skin: No rashes or erythema  Psychiatric: Normal mood and affect   Lab Results  Component Value Date   WBC 8.4 01/01/2018   HGB 10.4 (L) 01/01/2018   HCT 33.8 (L) 01/01/2018   MCV 92.1 01/01/2018   PLT 230 01/01/2018    Lab Results  Component Value Date   CREATININE 1.48 (H) 01/01/2018   BUN 29 (H) 01/01/2018   NA 139 01/01/2018   K 4.3 01/01/2018   CL 105 01/01/2018   CO2 23 01/01/2018    Lab  Results  Component Value Date   ALT 29 12/30/2017   AST 48 (H) 12/30/2017   ALKPHOS 84 12/30/2017     Microbiology: Recent Results (from the past 240 hour(s))  Blood culture (routine x 2)     Status: Abnormal   Collection Time: 12/28/17  6:17 PM  Result Value Ref Range Status   Specimen Description BLOOD RIGHT ANTECUBITAL  Final   Special Requests   Final    BOTTLES DRAWN AEROBIC AND ANAEROBIC Blood Culture adequate volume   Culture  Setup Time   Final    GRAM POSITIVE COCCI IN CHAINS IN BOTH AEROBIC AND ANAEROBIC BOTTLES CRITICAL RESULT CALLED TO, READ BACK BY AND VERIFIED WITH: Sandria Bales [id number] MLM    Culture GROUP B STREP(S.AGALACTIAE)ISOLATED (A)  Final   Report Status 12/31/2017 FINAL  Final   Organism ID, Bacteria GROUP B STREP(S.AGALACTIAE)ISOLATED  Final      Susceptibility   Group b strep(s.agalactiae)isolated - MIC*    CLINDAMYCIN <=0.25 SENSITIVE Sensitive     AMPICILLIN <=0.25 SENSITIVE Sensitive     ERYTHROMYCIN <=0.12 SENSITIVE Sensitive     VANCOMYCIN 0.5 SENSITIVE Sensitive     CEFTRIAXONE <=0.12 SENSITIVE Sensitive     LEVOFLOXACIN 1 SENSITIVE Sensitive     * GROUP B STREP(S.AGALACTIAE)ISOLATED  Blood culture (routine x 2)     Status: Abnormal   Collection Time: 12/28/17  6:17 PM  Result Value Ref Range Status   Specimen Description BLOOD BLOOD RIGHT FOREARM  Final   Special Requests   Final    BOTTLES DRAWN AEROBIC AND ANAEROBIC Blood Culture adequate volume   Culture  Setup Time   Final    GRAM POSITIVE COCCI IN CHAINS CRITICAL VALUE NOTED.  VALUE IS CONSISTENT WITH PREVIOUSLY REPORTED AND CALLED VALUE. IN BOTH AEROBIC AND ANAEROBIC BOTTLES    Culture (A)  Final    GROUP B STREP(S.AGALACTIAE)ISOLATED SUSCEPTIBILITIES PERFORMED ON PREVIOUS CULTURE WITHIN THE LAST 5 DAYS.    Report Status 12/31/2017 FINAL  Final  Blood Culture ID Panel (Reflexed)     Status: Abnormal   Collection Time: 12/28/17  6:17 PM  Result Value Ref Range Status     Enterococcus species NOT DETECTED NOT DETECTED Final   Listeria monocytogenes NOT DETECTED NOT DETECTED Final   Staphylococcus species NOT DETECTED NOT DETECTED Final   Staphylococcus aureus NOT DETECTED NOT DETECTED Final   Streptococcus species DETECTED (A) NOT DETECTED Final    Comment: CRITICAL RESULT CALLED TO, READ BACK BY AND VERIFIED WITH: PHARMD N BATCHELDER [phone] MLM    Streptococcus agalactiae DETECTED (A) NOT DETECTED Final    Comment: CRITICAL RESULT CALLED TO, READ BACK BY AND VERIFIED WITH: PHARMD N BATCHELDER [id number] MLM    Streptococcus pneumoniae NOT DETECTED NOT DETECTED Final   Streptococcus pyogenes NOT DETECTED NOT DETECTED Final   Acinetobacter baumannii NOT DETECTED NOT DETECTED Final   Enterobacteriaceae species NOT DETECTED NOT DETECTED Final   Enterobacter cloacae complex NOT DETECTED NOT DETECTED Final   Escherichia coli NOT DETECTED NOT DETECTED Final   Klebsiella oxytoca NOT DETECTED NOT DETECTED Final   Klebsiella pneumoniae NOT DETECTED NOT DETECTED Final   Proteus species NOT DETECTED NOT DETECTED Final   Serratia marcescens NOT DETECTED NOT DETECTED Final   Haemophilus influenzae NOT DETECTED NOT DETECTED Final   Neisseria meningitidis NOT DETECTED NOT DETECTED Final   Pseudomonas aeruginosa NOT DETECTED NOT DETECTED Final   Candida albicans NOT DETECTED NOT DETECTED Final   Candida glabrata NOT DETECTED NOT DETECTED Final   Candida krusei NOT DETECTED NOT DETECTED Final   Candida parapsilosis NOT DETECTED NOT DETECTED Final   Candida tropicalis NOT DETECTED NOT DETECTED Final  Respiratory Panel by PCR     Status: Abnormal   Collection Time: 12/28/17  8:55 PM  Result Value  Ref Range Status   Adenovirus NOT DETECTED NOT DETECTED Final   Coronavirus 229E DETECTED (A) NOT DETECTED Final   Coronavirus HKU1 NOT DETECTED NOT DETECTED Final   Coronavirus NL63 NOT DETECTED NOT DETECTED Final   Coronavirus OC43 NOT DETECTED NOT DETECTED Final    Metapneumovirus NOT DETECTED NOT DETECTED Final   Rhinovirus / Enterovirus NOT DETECTED NOT DETECTED Final   Influenza A NOT DETECTED NOT DETECTED Final   Influenza B NOT DETECTED NOT DETECTED Final   Parainfluenza Virus 1 NOT DETECTED NOT DETECTED Final   Parainfluenza Virus 2 NOT DETECTED NOT DETECTED Final   Parainfluenza Virus 3 NOT DETECTED NOT DETECTED Final   Parainfluenza Virus 4 NOT DETECTED NOT DETECTED Final   Respiratory Syncytial Virus NOT DETECTED NOT DETECTED Final   Bordetella pertussis NOT DETECTED NOT DETECTED Final   Chlamydophila pneumoniae NOT DETECTED NOT DETECTED Final   Mycoplasma pneumoniae NOT DETECTED NOT DETECTED Final  MRSA PCR Screening     Status: None   Collection Time: 12/29/17 11:15 AM  Result Value Ref Range Status   MRSA by PCR NEGATIVE NEGATIVE Final    Comment:        The GeneXpert MRSA Assay (FDA approved for NASAL specimens only), is one component of a comprehensive MRSA colonization surveillance program. It is not intended to diagnose MRSA infection nor to guide or monitor treatment for MRSA infections.   Culture, blood (routine x 2)     Status: None (Preliminary result)   Collection Time: 12/30/17 12:42 AM  Result Value Ref Range Status   Specimen Description BLOOD LEFT ANTECUBITAL  Final   Special Requests   Final    BOTTLES DRAWN AEROBIC AND ANAEROBIC Blood Culture adequate volume   Culture NO GROWTH 1 DAY  Final   Report Status PENDING  Incomplete  Culture, blood (routine x 2)     Status: None (Preliminary result)   Collection Time: 12/30/17 12:48 AM  Result Value Ref Range Status   Specimen Description BLOOD LEFT HAND  Final   Special Requests   Final    BOTTLES DRAWN AEROBIC ONLY Blood Culture adequate volume   Culture NO GROWTH 1 DAY  Final   Report Status PENDING  Incomplete    Velna Ochs, MD - [place] for Infectious Disease [facility] [email or web address] [phone] pager   [phone] cell 01/01/2018, 11:32 AM

## 2018-01-01 NOTE — Progress Notes (Signed)
Hypoglycemic Event  CBG67  Treatment: 15 GM carbohydrate snack  Symptoms: None  Follow-up CBG: Time:0230 CBG Result:62  Possible Reasons for Event: Unknown  Comments/MD notified:yes    Mirela Parsley A

## 2018-01-01 NOTE — Progress Notes (Signed)
Hypoglycemic Event  CBG:62  Treatment: D50 IV 25 mL  Symptoms: None  Follow-up CBG: Time:0305   CBG Result:94   Possible Reasons for Event:   Comments/MD notified:yes    Demtrius Rounds A

## 2018-01-01 NOTE — Progress Notes (Signed)

## 2018-01-02 DIAGNOSIS — R7881 Bacteremia: Secondary | ICD-10-CM

## 2018-01-02 LAB — BASIC METABOLIC PANEL
ANION GAP: 10 (ref 5–15)
BUN: 24 mg/dL — AB (ref 6–20)
CHLORIDE: 102 mmol/L (ref 101–111)
CO2: 22 mmol/L (ref 22–32)
Calcium: 8.4 mg/dL — ABNORMAL LOW (ref 8.9–10.3)
Creatinine, Ser: 1.39 mg/dL — ABNORMAL HIGH (ref 0.61–1.24)
GFR, EST AFRICAN AMERICAN: 56 mL/min — AB (ref >60)
GFR, EST NON AFRICAN AMERICAN: 48 mL/min — AB (ref >60)
Glucose, Bld: 225 mg/dL — ABNORMAL HIGH (ref 65–99)
POTASSIUM: 5.1 mmol/L (ref 3.5–5.1)
SODIUM: 134 mmol/L — AB (ref 135–145)

## 2018-01-02 LAB — CBC
HCT: 33.1 % — ABNORMAL LOW (ref 39.0–52.0)
HEMOGLOBIN: 10 g/dL — AB (ref 13.0–17.0)
MCH: 27.9 pg (ref 26.0–34.0)
MCHC: 30.2 g/dL (ref 30.0–36.0)
MCV: 92.2 fL (ref 78.0–100.0)
PLATELETS: 273 10*3/uL (ref 150–400)
RBC: 3.59 MIL/uL — AB (ref 4.22–5.81)
RDW: 17.4 % — ABNORMAL HIGH (ref 11.5–15.5)
WBC: 9.1 10*3/uL (ref 4.0–10.5)

## 2018-01-02 LAB — GLUCOSE, CAPILLARY
GLUCOSE-CAPILLARY: 224 mg/dL — AB (ref 65–99)
Glucose-Capillary: 127 mg/dL — ABNORMAL HIGH (ref 65–99)
Glucose-Capillary: 203 mg/dL — ABNORMAL HIGH (ref 65–99)
Glucose-Capillary: 273 mg/dL — ABNORMAL HIGH (ref 65–99)

## 2018-01-02 MED ORDER — CEFTRIAXONE IV (FOR PTA / DISCHARGE USE ONLY): 2.0000 g | INTRAVENOUS | 0 refills | Status: DC

## 2018-01-02 MED ORDER — PENICILLIN G POTASSIUM IV (FOR PTA / DISCHARGE USE ONLY): 3.0000 10*6.[IU] | INTRAVENOUS | 0 refills | Status: DC

## 2018-01-02 MED ORDER — DEXTROSE 5 % IV SOLN
2.0000 g | INTRAVENOUS | Status: DC
  Administered 2018-01-02: 2 g via INTRAVENOUS
  Filled 2018-01-02: qty 2

## 2018-01-02 MED ORDER — HEPARIN SOD (PORK) LOCK FLUSH 100 UNIT/ML IV SOLN
250.0000 [IU] | INTRAVENOUS | Status: AC | PRN
  Administered 2018-01-02: 250 [IU]

## 2018-01-02 MED ORDER — SODIUM CHLORIDE 0.9% FLUSH: 10.0000 mL | INTRAVENOUS | Status: DC | PRN

## 2018-01-02 NOTE — Care Management (Addendum)
Spoke with pt's wife regarding HH choice.   Wife states they have no preference f agency and [facility] is acceptable.  Discussed OPAT and home IV abx with Jermaine from [facility].  HHRN will not be able to start care, including caregiver education on administering antibiotics, until tomorrow morning at the earliest.  PCN G ordered Q4H.  Because of the burden on the caregiver and the start of care for tomorrow, discussed with MD and order changed to ceftriaxone.  Pt will need to receive a dose here this afternoon and start of care/medication delivery can be arranged for tomorrow afternoon.

## 2018-01-02 NOTE — Progress Notes (Signed)
Peripherally Inserted Central Catheter/Midline Placement  The IV Nurse has discussed with the patient and/or persons authorized to consent for the patient, the purpose of this procedure and the potential benefits and risks involved with this procedure.  The benefits include less needle sticks, lab draws from the catheter, and the patient may be discharged home with the catheter. Risks include, but not limited to, infection, bleeding, blood clot (thrombus formation), and puncture of an artery; nerve damage and irregular heartbeat and possibility to perform a PICC exchange if needed/ordered by physician.  Alternatives to this procedure were also discussed.  Bard Power PICC patient education guide, fact sheet on infection prevention and patient information card has been provided to patient /or left at bedside.    PICC/Midline Placement Documentation  PICC Single Lumen 45/80/99 PICC Right Basilic 48 cm 0 cm (Active)  Indication for Insertion or Continuance of Line Home intravenous therapies (PICC only) 01/02/2018 10:21 AM  Exposed Catheter (cm) 0 cm 01/02/2018 10:21 AM  Site Assessment Clean;Dry;Intact 01/02/2018 10:21 AM  Line Status Flushed;Saline locked;Blood return noted 01/02/2018 10:21 AM  Dressing Type Transparent 01/02/2018 10:21 AM  Dressing Status Clean;Dry;Intact;Antimicrobial disc in place 01/02/2018 10:21 AM  Line Care Connections checked and tightened 01/02/2018 10:21 AM  Line Adjustment (NICU/IV Team Only) No 01/02/2018 10:21 AM  Dressing Intervention New dressing 01/02/2018 10:21 AM  Dressing Change Due 01/09/18 01/02/2018 10:21 AM       Rolena Infante 01/02/2018, 10:22 AM

## 2018-01-02 NOTE — Progress Notes (Signed)
Pharmacy Antibiotic Note  [name] is a 77 y.o. male admitted on 12/28/2017 with SOB.  Patient has Strep bacteremia but unable to do a TTE d/t positioning.  He has a pacemaker.  Pharmacy has been consulted to transition from Pen G to Rocephin for ease of administration in the outpatient setting.  Patient is afebrile with WNL WBC.   Plan: Rocephin 2gm IV Q24H through 01/26/18 Pharmacy will sign off and follow peripherally.  Thank you for the consult!   Height: 5\' 10"  (177.8 cm) Weight: (!) 331 lb 6.4 oz (150.3 kg) IBW/kg (Calculated) : 73  Temp (24hrs), Avg:98 F (36.7 C), Min:97.9 F (36.6 C), Max:98.1 F (36.7 C)  Recent Labs  Lab 12/28/17 1819 12/28/17 2002 12/29/17 3212 12/29/17 2482 12/29/17 1117 12/30/17 0044 12/31/17 0804 01/01/18 0324 01/02/18 0527  WBC  --   --  19.1*  --   --  15.0* 9.7 8.4 9.1  CREATININE  --   --  2.00*  --   --  1.73* 1.52* 1.48* 1.39*  LATICACIDVEN 4.58* 3.27*  --  1.7 3.3*  --   --   --   --     Estimated Creatinine Clearance: 68.5 mL/min (A) (by C-G formula based on SCr of 1.39 mg/dL (H)).    Allergies  Allergen Reactions  . Actos [Pioglitazone] Swelling  . Timolol Other (See Comments)    Slow heart rate but tolerates Cosopt (dorzolamide-timolol)    CTX 1/21 >> 1/22, resume 1/26 >> Azith 1/21 >> 1/22 PCN G 1/22 >> 1/26  1/21 Influenza PCR - negative 1/21 BCx - GBS (pan sensitive) 1/22 Strep pneumo Ag - negative 1/23 BCx - ngx2d   Kailly Richoux D. Mina Marble, PharmD, BCPS Pager:  [phone] 01/02/2018, 1:54 PM

## 2018-01-02 NOTE — Progress Notes (Addendum)
PHARMACY CONSULT NOTE FOR:  OUTPATIENT  PARENTERAL ANTIBIOTIC THERAPY (OPAT)  Indication: Strep bacteremia, has pacemaker  Regimen: Pen G 3 million units IV Q4H End date: 01/26/18  IV antibiotic discharge orders are pended. To discharging provider:  please sign these orders via discharge navigator,  Select New Orders & click on the button choice - Manage This Unsigned Work.     Thank you for allowing pharmacy to be a part of this patient's care.  Dreux Mcgroarty D. Mina Marble, PharmD, BCPS Pager:  [phone] 01/02/2018, 11:32 AM  ===========================  Addendum: Change to Rocephin because [facility] cannot come until tomorrow. Rocephin 2gm IV Q24H   Khang Hannum D. Mina Marble, PharmD, BCPS Pager:  [phone] 01/02/2018, 1:38 PM

## 2018-01-02 NOTE — Discharge Summary (Addendum)
Physician Discharge Summary  [name] SFK:[id number] DOB: [birth year] DOA: 12/28/2017  PCP: Renato Shin, MD  Admit date: 12/28/2017 Discharge date: 01/02/2018  Time spent: 45 minutes  Recommendations for Outpatient Follow-up:  Patient will be discharged to home with home health nursing.  Patient will need to follow up with primary care provider within one week of discharge.  Follow up with the infectious disease clinic in 3-4 weeks. Follow up with Dr. [name] in one week to discuss diabetes management.  Patient should continue medications as prescribed.  Patient should follow a carb modified diet.   Discharge Diagnoses:  Sepsis secondary to strep bacteremia Upper respiratory infection from coronavirus COPD Chronic kidney disease, stage III Diabetes mellitus, type II, uncontrolled, concave by episodes of hypoglycemia Sick sinus syndrome/history of atrial fibrillation Lower extremity skin changes/possible wounds Normocytic anemia Glaucoma Hyperlipidemia GERD Chronic diastolic heart failure Leg/groin pain Hypothyroidism  Discharge Condition: Stable  Diet recommendation: Carb modified  Code status: DNR  Filed Weights   12/30/17 1200 01/01/18 0000  Weight: (!) 150.1 kg (331 lb) (!) 150.3 kg (331 lb 6.4 oz)    History of present illness:  On 12/28/2017 by Dr. Olen Cordial Reavesis a 76 y.o.malewith medical history significant ofCOPD, DM2 with CKD stage 3 currently requiring 910 units of insulin / day treatment by [name], glaucoma on 5 eye drops, SSS with pacer, h/o DVT, A.Fib on eliquis.  Patient presents to the ED with c/o cough, intermittent L leg / groin pain. No wheezing, no sputum production, no CP, no vomiting, no abd pain, no sore throat. Symptoms onset Friday, worsened over weekend.  Hospital Course:  Sepsis secondary to strep bacteremia -Presented with fever, leukocytosis -Possibly secondary to ulcerations on the lower extremities vs  pacemaker -blood cultures 2/2 Strep group B -Repeat blood cultures on 12/30/2017, show no growth to date -Echocardiogram ordered, however patient unable to tolerate study due to positioning  -Discussed with ID, recommended TEE, however, patient is high risk for decompensation given his sleep apnea -Currently on penicillin- will discharge with ceftriaxone for ease- once daily dosing -PICC line placed today -infectious disease consulted and appreciated  Upper respiratory infection from coronavirus -Respiratory viral panel positive for coronavirus -Influenza PCR negative -Continue to layer, and treatments, Delsym and Mucinex -Strep pneumonia urine antigen negative -Chest x-ray reviewed, no infection noted, chronic changes -continue nasal spray for nasal congestion  COPD -Continue nebulizer treatments  Chronic kidney disease, stage III -Creatinine currently 1.48 and stable -continue to monitor BMP  Diabetes mellitus, type II, uncontrolled, concave by episodes of hypoglycemia -Overnight, patient's CBGs dropped again -He is on approximately 900 units of insulin per day at home -Patient follows with Dr. [name] -Discussed with Dr. [name], patient does not need long acting insulin due to his drops overnight. Recommended discontinuing nightly insulin dose, and have patient follow up with him after discharge.  Sick sinus syndrome/history of atrial fibrillation -Continue amiodarone, Eliquis -patient will follow up with cardiology in one week  Lower extremity skin changes/possible wounds -Wound care consulted and appreciated  Normocytic anemia -Drop thought to be possibly dilutional, as patient's Lasix has been held -hemoglobin today 10 and appears to be stable (baseline approximately 11) -Continue to monitor CBC  Glaucoma -Continue Netasurdil Dimesylate, Dorzolamide, Brimodine, Timolol, and Travoprost   Hyperlipidemia -Continue statin, omega-3's  GERD -Continue  PPI  Chronic diastolic heart failure -Patient was given IV fluid on admission, continue to monitor volume status  Leg/groin pain -History of blood clot -LE  doppler: Difficult exam due to patient's body habitus, breathing treatment composition and pitting edema. No evidence of DVT in the visualized veins. Negative for Baker's cyst on the left  Hypothyroidism -Continue Synthroid  Morbid obesity -BMI 47.55  Consultants Dr. [name], endocrinology, via phone Cardiology (for possible TEE) Infectious disease  Procedures  LLE doppler  Discharge Exam: Vitals:   01/02/18 0550 01/02/18 0823  BP: 130/65   Pulse: 71   Resp: 18   Temp: 98.1 F (36.7 C)   SpO2: 93% 94%     General: Well developed, chronically ill appearing, NAD  HEENT: NCAT, mucous membranes moist.  Cardiovascular: S1 S2 auscultated, no rubs, murmurs or gallops. Regular rate and rhythm.  Respiratory: Clear to auscultation bilaterally with equal chest rise  Abdomen: Soft, obese, nontender, nondistended, + bowel sounds  Extremities: warm dry without cyanosis clubbing. LE edema   Neuro: AAOx3, nonfocal  Psych: Normal affect and demeanor with intact judgement and insight  Discharge Instructions Discharge Instructions    Discharge instructions   Complete by:  As directed    Patient will be discharged to home with home health nursing.  Patient will need to follow up with primary care provider within one week of discharge.  Follow up with the infectious disease clinic in 3-4 weeks. Follow up with Dr. [name] in one week to discuss diabetes management.  Patient should continue medications as prescribed.  Patient should follow a carb modified diet.   Home infusion instructions [facility] May follow [place] Dosing Protocol; May administer Cathflo as needed to maintain patency of vascular access device.; Flushing of vascular access device: per [facility] Protocol: 0.9% NaCl pre/post medica...   Complete by:   As directed    Instructions:  May follow [place] Dosing Protocol   Instructions:  May administer Cathflo as needed to maintain patency of vascular access device.   Instructions:  Flushing of vascular access device: per [facility] Protocol: 0.9% NaCl pre/post medication administration and prn patency; Heparin 100 u/ml, 72m for implanted ports and Heparin 10u/ml, 537mfor all other central venous catheters.   Instructions:  May follow [facility] Anaphylaxis Protocol for First Dose Administration in the home: 0.9% NaCl at 25-50 ml/hr to maintain IV access for protocol meds. Epinephrine 0.3 ml IV/IM PRN and Benadryl 25-50 IV/IM PRN s/s of anaphylaxis.   Instructions:  [facility] Coordinator (RN) to assist per patient IV care needs in the home PRN.   Home infusion instructions [facility] May follow [place]; May administer Cathflo as needed to maintain patency of vascular access device.; Flushing of vascular access device: [facility]: 0.9% NaCl pre/post medica...   Complete by:  As directed    Instructions:  May follow [place] Protocol   Instructions:  May administer Cathflo as needed to maintain patency of vascular access device.   Instructions:  Flushing of vascular access device[facility]: 0.9% NaCl pre/post medication administration and prn patency; Heparin 100 u/ml, 79m4mor implanted ports and Heparin 10u/ml, 79ml39mr all other central venous catheters.   Instructions:  May follow [facility] Anaphylaxis Protocol for First Dose Administration in the home: 0.9% NaCl at 25-50 ml/hr to maintain IV access for protocol meds. Epinephrine 0.3 ml IV/IM PRN and Benadryl 25-50 IV/IM PRN s/s of anaphylaxis.   Instructions:  [facility] Coordinator (RN) to assist per patient IV care needs in the home PRN.     Allergies as of 01/02/2018  Reactions   Actos [pioglitazone] Swelling   Timolol Other (See Comments)   Slow heart rate but  tolerates Cosopt (dorzolamide-timolol)      Medication List    STOP taking these medications   PRESCRIPTION MEDICATION     TAKE these medications   acetaminophen 500 MG tablet Commonly known as:  TYLENOL Take 1,000 mg by mouth 2 (two) times daily as needed (pain/headache).   Acidophilus Caps capsule Take 1 capsule by mouth every evening.   albuterol (2.5 MG/3ML) 0.083% nebulizer solution Commonly known as:  PROVENTIL Take 3 mLs (2.5 mg total) by nebulization every 4 (four) hours as needed for wheezing or shortness of breath.   albuterol 108 (90 Base) MCG/ACT inhaler Commonly known as:  VENTOLIN HFA Inhale 2 puffs into the lungs every 6 (six) hours as needed for wheezing or shortness of breath.   ALPRAZolam 0.25 MG tablet Commonly known as:  XANAX TAKE 1 TABLET BY MOUTH 3 TIMES A DAY AS NEEDED FOR ANXIETY OR SLEEP What changed:  See the new instructions.   amiodarone 200 MG tablet Commonly known as:  PACERONE TAKE 1 TABLET DAILY What changed:    how much to take  how to take this  when to take this   apixaban 5 MG Tabs tablet Commonly known as:  ELIQUIS Take 1 tablet (5 mg total) by mouth 2 (two) times daily.   brimonidine 0.1 % Soln Commonly known as:  ALPHAGAN P Place 1 drop into the left eye 3 (three) times daily.   budesonide-formoterol 80-4.5 MCG/ACT inhaler Commonly known as:  SYMBICORT Inhale 2 puffs into the lungs 2 (two) times daily.   calcium carbonate 500 MG chewable tablet Commonly known as:  TUMS - dosed in mg elemental calcium Chew 2 tablets by mouth 2 (two) times daily as needed for indigestion or heartburn.   cefTRIAXone IVPB Commonly known as:  ROCEPHIN Inject 2 g into the vein daily. Indication:  Strep bacteremia, has pacemaker Last Day of Therapy:  01/26/18 Labs - Once weekly:  CBC/D and BMP, Labs - Every other week:  ESR and CRP   cholecalciferol 1000 units tablet Commonly known as:  VITAMIN D Take 1,000 Units by mouth 2 (two) times  daily.   CINNAMON PO Take 2,000 mg by mouth daily.   dextromethorphan 30 MG/5ML liquid Commonly known as:  DELSYM Take 90 mg by mouth 2 (two) times daily as needed for cough.   docusate sodium 100 MG capsule Commonly known as:  COLACE Take 100-200 mg by mouth daily as needed for mild constipation.   dorzolamide 2 % ophthalmic solution Commonly known as:  TRUSOPT Place 1 drop into both eyes 2 (two) times daily.   Fish Oil 1000 MG Caps Take 1,000 mg by mouth daily.   fluticasone 50 MCG/ACT nasal spray Commonly known as:  FLONASE USE 2 SPRAYS IN EACN NOSTRIL DAILY What changed:    how much to take  how to take this  when to take this  reasons to take this  additional instructions   furosemide 40 MG tablet Commonly known as:  LASIX Take 60 mg in the AM and 20 mg in the early afternoon - total dose of 80 mg a day What changed:    how much to take  how to take this  when to take this  additional instructions   guaiFENesin 600 MG 12 hr tablet Commonly known as:  MUCINEX Take 600 mg by mouth 2 (two) times daily as needed for  cough or to loosen phlegm.   insulin regular 100 units/mL injection Commonly known as:  NOVOLIN R 4 times a day (just before each meal), 300 units with breakfast, 210 units with lunch, 220 units  at supper, and 190 units with bedtime snack. What changed:    how much to take  how to take this  when to take this  additional instructions   ipratropium-albuterol 0.5-2.5 (3) MG/3ML Soln Commonly known as:  DUONEB Take 3 mLs by nebulization every 4 (four) hours as needed.   levothyroxine 75 MCG tablet Commonly known as:  SYNTHROID, LEVOTHROID Take 1 tablet (75 mcg total) by mouth daily before breakfast.   metoprolol tartrate 25 MG tablet Commonly known as:  LOPRESSOR Take 1 tablet (25 mg total) by mouth 3 (three) times daily.   MULTI-ENZYME Tabs Take 1 tablet by mouth daily.   multivitamin with minerals Tabs tablet Take 1 tablet  by mouth every evening.   omeprazole 40 MG capsule Commonly known as:  PRILOSEC TAKE 1 CAPSULE DAILY What changed:    how much to take  how to take this  when to take this   penicillin G IVPB Inject 3 Million Units into the vein every 4 (four) hours. Indication:  Strep bacteremia with pacemaker Last Day of Therapy:  01/26/18 Labs - Once weekly:  CBC/D and BMP, Labs - Every other week:  ESR and CRP   Resveratrol 100 MG Caps Take 100 mg by mouth every evening.   RHOPRESSA 0.02 % Soln Generic drug:  Netarsudil Dimesylate Place 1 drop into the left eye daily.   rosuvastatin 40 MG tablet Commonly known as:  CRESTOR Take 20 mg by mouth every evening.   Timolol Maleate 0.5 % (DAILY) Soln Place 1 drop into both eyes 2 (two) times daily.   traMADol 50 MG tablet Commonly known as:  ULTRAM TAKE 2 TABLETS BY MOUTH TWICE A DAY AS NEEDED FOR PAIN What changed:  See the new instructions.   travoprost (benzalkonium) 0.004 % ophthalmic solution Commonly known as:  TRAVATAN Place 1 drop into the left eye at bedtime.   vitamin C 500 MG tablet Commonly known as:  ASCORBIC ACID Take 500 mg by mouth daily.            Home Infusion Instuctions  (From admission, onward)        Start     Ordered   01/02/18 0000  Home infusion instructions [facility] May follow [place] Dosing Protocol; May administer Cathflo as needed to maintain patency of vascular access device.; Flushing of vascular access device: per [facility] Protocol: 0.9% NaCl pre/post medica...    Question Answer Comment  Instructions May follow [place] Dosing Protocol   Instructions May administer Cathflo as needed to maintain patency of vascular access device.   Instructions Flushing of vascular access device: per [facility] Protocol: 0.9% NaCl pre/post medication administration and prn patency; Heparin 100 u/ml, 64m for implanted ports and Heparin 10u/ml, 571mfor all other central venous catheters.   Instructions  May follow [facility] Anaphylaxis Protocol for First Dose Administration in the home: 0.9% NaCl at 25-50 ml/hr to maintain IV access for protocol meds. Epinephrine 0.3 ml IV/IM PRN and Benadryl 25-50 IV/IM PRN s/s of anaphylaxis.   Instructions [facility] Infusion Coordinator (RN) to assist per patient IV care needs in the home PRN.      01/02/18 1216   01/02/18 0000  Home infusion instructions [facility] May follow [facility]  Dosing Protocol; May administer Cathflo as needed to maintain patency of vascular access device.; Flushing of vascular access device: per [facility] Protocol: 0.9% NaCl pre/post medica...    Question Answer Comment  Instructions May follow [place] Dosing Protocol   Instructions May administer Cathflo as needed to maintain patency of vascular access device.   Instructions Flushing of vascular access device: per [facility] Protocol: 0.9% NaCl pre/post medication administration and prn patency; Heparin 100 u/ml, 31m for implanted ports and Heparin 10u/ml, 547mfor all other central venous catheters.   Instructions May follow [facility] Anaphylaxis Protocol for First Dose Administration in the home: 0.9% NaCl at 25-50 ml/hr to maintain IV access for protocol meds. Epinephrine 0.3 ml IV/IM PRN and Benadryl 25-50 IV/IM PRN s/s of anaphylaxis.   Instructions [facility] Infusion Coordinator (RN) to assist per patient IV care needs in the home PRN.      01/02/18 1348     Allergies  Allergen Reactions  . Actos [Pioglitazone] Swelling  . Timolol Other (See Comments)    Slow heart rate but tolerates Cosopt (dorzolamide-timolol)      The results of significant diagnostics from this hospitalization (including imaging, microbiology, ancillary and laboratory) are listed below for reference.    Significant Diagnostic Studies: Dg Chest Port 1 View  Result Date: 12/30/2017 CLINICAL DATA:  Shortness of Breath EXAM: PORTABLE CHEST 1 VIEW COMPARISON:  12/28/2017 FINDINGS: Cardiac  shadow is enlarged. Pacing device is again seen and stable. Diffuse interstitial changes are again identified throughout both lungs stable from the prior exam. No new focal abnormality is seen. No bony abnormality is noted. IMPRESSION: Chronic changes without acute abnormality. Electronically Signed   By: MaInez Catalina.D.   On: 12/30/2017 08:25   Dg Chest Portable 1 View  Result Date: 12/28/2017 CLINICAL DATA:  Dyspnea and groin pain. EXAM: PORTABLE CHEST 1 VIEW COMPARISON:  07/28/2017 FINDINGS: Stable cardiomegaly with aortic atherosclerosis. No pneumonic consolidation nor overt pulmonary edema. Chronic stable subpleural areas of mild interstitial lung markings may reflect areas of subpleural fibrosis and/or atelectasis. Right atrial and right ventricular leads with left-sided pacemaker apparatus appear stable. No acute osseous abnormality. IMPRESSION: Stable cardiomegaly with aortic atherosclerosis. Stable subpleural increased reticular lung markings may reflect areas of atelectasis, scarring and/or fibrosis bilaterally. Electronically Signed   By: DaAshley Royalty.D.   On: 12/28/2017 17:56    Microbiology: Recent Results (from the past 240 hour(s))  Blood culture (routine x 2)     Status: Abnormal   Collection Time: 12/28/17  6:17 PM  Result Value Ref Range Status   Specimen Description BLOOD RIGHT ANTECUBITAL  Final   Special Requests   Final    BOTTLES DRAWN AEROBIC AND ANAEROBIC Blood Culture adequate volume   Culture  Setup Time   Final    GRAM POSITIVE COCCI IN CHAINS IN BOTH AEROBIC AND ANAEROBIC BOTTLES CRITICAL RESULT CALLED TO, READ BACK BY AND VERIFIED WITH: PHSandria [id number] MLM    Culture GROUP B STREP(S.AGALACTIAE)ISOLATED (A)  Final   Report Status 12/31/2017 FINAL  Final   Organism ID, Bacteria GROUP B STREP(S.AGALACTIAE)ISOLATED  Final      Susceptibility   Group b strep(s.agalactiae)isolated - MIC*    CLINDAMYCIN <=0.25 SENSITIVE Sensitive     AMPICILLIN  <=0.25 SENSITIVE Sensitive     ERYTHROMYCIN <=0.12 SENSITIVE Sensitive     VANCOMYCIN 0.5 SENSITIVE Sensitive     CEFTRIAXONE <=0.12 SENSITIVE Sensitive     LEVOFLOXACIN 1 SENSITIVE Sensitive     *  GROUP B STREP(S.AGALACTIAE)ISOLATED  Blood culture (routine x 2)     Status: Abnormal   Collection Time: 12/28/17  6:17 PM  Result Value Ref Range Status   Specimen Description BLOOD BLOOD RIGHT FOREARM  Final   Special Requests   Final    BOTTLES DRAWN AEROBIC AND ANAEROBIC Blood Culture adequate volume   Culture  Setup Time   Final    GRAM POSITIVE COCCI IN CHAINS CRITICAL VALUE NOTED.  VALUE IS CONSISTENT WITH PREVIOUSLY REPORTED AND CALLED VALUE. IN BOTH AEROBIC AND ANAEROBIC BOTTLES    Culture (A)  Final    GROUP B STREP(S.AGALACTIAE)ISOLATED SUSCEPTIBILITIES PERFORMED ON PREVIOUS CULTURE WITHIN THE LAST 5 DAYS.    Report Status 12/31/2017 FINAL  Final  Blood Culture ID Panel (Reflexed)     Status: Abnormal   Collection Time: 12/28/17  6:17 PM  Result Value Ref Range Status   Enterococcus species NOT DETECTED NOT DETECTED Final   Listeria monocytogenes NOT DETECTED NOT DETECTED Final   Staphylococcus species NOT DETECTED NOT DETECTED Final   Staphylococcus aureus NOT DETECTED NOT DETECTED Final   Streptococcus species DETECTED (A) NOT DETECTED Final    Comment: CRITICAL RESULT CALLED TO, READ BACK BY AND VERIFIED WITH: PHARMD N BATCHELDER [id number] MLM    Streptococcus agalactiae DETECTED (A) NOT DETECTED Final    Comment: CRITICAL RESULT CALLED TO, READ BACK BY AND VERIFIED WITH: PHARMD N BATCHELDER [id number] MLM    Streptococcus pneumoniae NOT DETECTED NOT DETECTED Final   Streptococcus pyogenes NOT DETECTED NOT DETECTED Final   Acinetobacter baumannii NOT DETECTED NOT DETECTED Final   Enterobacteriaceae species NOT DETECTED NOT DETECTED Final   Enterobacter cloacae complex NOT DETECTED NOT DETECTED Final   Escherichia coli NOT DETECTED NOT DETECTED Final   Klebsiella  oxytoca NOT DETECTED NOT DETECTED Final   Klebsiella pneumoniae NOT DETECTED NOT DETECTED Final   Proteus species NOT DETECTED NOT DETECTED Final   Serratia marcescens NOT DETECTED NOT DETECTED Final   Haemophilus influenzae NOT DETECTED NOT DETECTED Final   Neisseria meningitidis NOT DETECTED NOT DETECTED Final   Pseudomonas aeruginosa NOT DETECTED NOT DETECTED Final   Candida albicans NOT DETECTED NOT DETECTED Final   Candida glabrata NOT DETECTED NOT DETECTED Final   Candida krusei NOT DETECTED NOT DETECTED Final   Candida parapsilosis NOT DETECTED NOT DETECTED Final   Candida tropicalis NOT DETECTED NOT DETECTED Final  Respiratory Panel by PCR     Status: Abnormal   Collection Time: 12/28/17  8:55 PM  Result Value Ref Range Status   Adenovirus NOT DETECTED NOT DETECTED Final   Coronavirus 229E DETECTED (A) NOT DETECTED Final   Coronavirus HKU1 NOT DETECTED NOT DETECTED Final   Coronavirus NL63 NOT DETECTED NOT DETECTED Final   Coronavirus OC43 NOT DETECTED NOT DETECTED Final   Metapneumovirus NOT DETECTED NOT DETECTED Final   Rhinovirus / Enterovirus NOT DETECTED NOT DETECTED Final   Influenza A NOT DETECTED NOT DETECTED Final   Influenza B NOT DETECTED NOT DETECTED Final   Parainfluenza Virus 1 NOT DETECTED NOT DETECTED Final   Parainfluenza Virus 2 NOT DETECTED NOT DETECTED Final   Parainfluenza Virus 3 NOT DETECTED NOT DETECTED Final   Parainfluenza Virus 4 NOT DETECTED NOT DETECTED Final   Respiratory Syncytial Virus NOT DETECTED NOT DETECTED Final   Bordetella pertussis NOT DETECTED NOT DETECTED Final   Chlamydophila pneumoniae NOT DETECTED NOT DETECTED Final   Mycoplasma pneumoniae NOT DETECTED NOT DETECTED Final  MRSA PCR Screening  Status: None   Collection Time: 12/29/17 11:15 AM  Result Value Ref Range Status   MRSA by PCR NEGATIVE NEGATIVE Final    Comment:        The GeneXpert MRSA Assay (FDA approved for NASAL specimens only), is one component of  a comprehensive MRSA colonization surveillance program. It is not intended to diagnose MRSA infection nor to guide or monitor treatment for MRSA infections.   Culture, blood (routine x 2)     Status: None (Preliminary result)   Collection Time: 12/30/17 12:42 AM  Result Value Ref Range Status   Specimen Description BLOOD LEFT ANTECUBITAL  Final   Special Requests   Final    BOTTLES DRAWN AEROBIC AND ANAEROBIC Blood Culture adequate volume   Culture NO GROWTH 2 DAYS  Final   Report Status PENDING  Incomplete  Culture, blood (routine x 2)     Status: None (Preliminary result)   Collection Time: 12/30/17 12:48 AM  Result Value Ref Range Status   Specimen Description BLOOD LEFT HAND  Final   Special Requests   Final    BOTTLES DRAWN AEROBIC ONLY Blood Culture adequate volume   Culture NO GROWTH 2 DAYS  Final   Report Status PENDING  Incomplete     Labs: Basic Metabolic Panel: Recent Labs  Lab 12/29/17 0628 12/30/17 0044 12/31/17 0804 01/01/18 0324 01/02/18 0527  NA 136 139 135 139 134*  K 3.9 3.9 4.3 4.3 5.1  CL 102 106 103 105 102  CO2 21* 20* 21* 23 22  GLUCOSE 224* 74 235* 94 225*  BUN 34* 39* 28* 29* 24*  CREATININE 2.00* 1.73* 1.52* 1.48* 1.39*  CALCIUM 8.4* 8.3* 8.2* 8.8* 8.4*  MG  --  2.1  --   --   --   PHOS  --  2.6  --   --   --    Liver Function Tests: Recent Labs  Lab 12/28/17 1751 12/30/17 0044  AST 44* 48*  ALT 29 29  ALKPHOS 102 84  BILITOT 0.7 0.5  PROT 7.7 6.6  ALBUMIN 3.4* 2.7*   No results for input(s): LIPASE, AMYLASE in the last 168 hours. No results for input(s): AMMONIA in the last 168 hours. CBC: Recent Labs  Lab 12/28/17 1751 12/29/17 0628 12/30/17 0044 12/31/17 0804 01/01/18 0324 01/02/18 0527  WBC 16.0* 19.1* 15.0* 9.7 8.4 9.1  NEUTROABS 12.4*  --  12.1*  --   --   --   HGB 11.3* 9.4* 9.3* 9.8* 10.4* 10.0*  HCT 36.7* 31.0* 30.7* 32.6* 33.8* 33.1*  MCV 91.1 89.3 90.6 91.8 92.1 92.2  PLT 248 215 211 220 230 273   Cardiac  Enzymes: No results for input(s): CKTOTAL, CKMB, CKMBINDEX, TROPONINI in the last 168 hours. BNP: BNP (last 3 results) Recent Labs    07/28/17 1530 12/28/17 1751  BNP 173.7* 767.4*    ProBNP (last 3 results) Recent Labs    10/26/17 1425  PROBNP 1,148*    CBG: Recent Labs  Lab 01/01/18 2109 01/02/18 0054 01/02/18 0553 01/02/18 0801 01/02/18 1203  GLUCAP 300* 127* 203* 224* 273*       Signed:  Silas Muff  [facility] 01/02/2018, 1:53 PM

## 2018-01-02 NOTE — Progress Notes (Signed)
Discharge home. Home discharge instruction given, no question verbalized. 

## 2018-01-03 ENCOUNTER — Telehealth: Payer: Self-pay | Admitting: Family Medicine

## 2018-01-03 DIAGNOSIS — I5032 Chronic diastolic (congestive) heart failure: Secondary | ICD-10-CM | POA: Diagnosis not present

## 2018-01-03 DIAGNOSIS — I2510 Atherosclerotic heart disease of native coronary artery without angina pectoris: Secondary | ICD-10-CM | POA: Diagnosis not present

## 2018-01-03 DIAGNOSIS — A401 Sepsis due to streptococcus, group B: Secondary | ICD-10-CM | POA: Diagnosis not present

## 2018-01-03 DIAGNOSIS — I130 Hypertensive heart and chronic kidney disease with heart failure and stage 1 through stage 4 chronic kidney disease, or unspecified chronic kidney disease: Secondary | ICD-10-CM | POA: Diagnosis not present

## 2018-01-03 DIAGNOSIS — Z452 Encounter for adjustment and management of vascular access device: Secondary | ICD-10-CM | POA: Diagnosis not present

## 2018-01-03 DIAGNOSIS — E1122 Type 2 diabetes mellitus with diabetic chronic kidney disease: Secondary | ICD-10-CM | POA: Diagnosis not present

## 2018-01-03 DIAGNOSIS — J449 Chronic obstructive pulmonary disease, unspecified: Secondary | ICD-10-CM | POA: Diagnosis not present

## 2018-01-03 DIAGNOSIS — N183 Chronic kidney disease, stage 3 (moderate): Secondary | ICD-10-CM | POA: Diagnosis not present

## 2018-01-03 DIAGNOSIS — J9621 Acute and chronic respiratory failure with hypoxia: Secondary | ICD-10-CM | POA: Diagnosis not present

## 2018-01-03 DIAGNOSIS — E11649 Type 2 diabetes mellitus with hypoglycemia without coma: Secondary | ICD-10-CM | POA: Diagnosis not present

## 2018-01-03 NOTE — Telephone Encounter (Signed)
On call note.  Routed to PCP.  Gave verbal order for incentive spirometry treaching, continued home care, wound care.   F/u orders per PCP.

## 2018-01-04 ENCOUNTER — Other Ambulatory Visit: Payer: Self-pay

## 2018-01-04 ENCOUNTER — Telehealth: Payer: Self-pay | Admitting: Endocrinology

## 2018-01-04 LAB — CULTURE, BLOOD (ROUTINE X 2)
CULTURE: NO GROWTH
Culture: NO GROWTH
SPECIAL REQUESTS: ADEQUATE
Special Requests: ADEQUATE

## 2018-01-04 NOTE — Telephone Encounter (Signed)
[facility] called [email or web address] stating patient need a request for orders please advise

## 2018-01-04 NOTE — Telephone Encounter (Signed)
I spoke with patient's wife to try to sort out message. I also called [facility] they stated that they already received the orders they needed. I foolowed up with patient's wife to let he know.

## 2018-01-05 ENCOUNTER — Encounter: Payer: Self-pay | Admitting: Endocrinology

## 2018-01-05 DIAGNOSIS — J449 Chronic obstructive pulmonary disease, unspecified: Secondary | ICD-10-CM | POA: Diagnosis not present

## 2018-01-05 DIAGNOSIS — Z452 Encounter for adjustment and management of vascular access device: Secondary | ICD-10-CM | POA: Diagnosis not present

## 2018-01-05 DIAGNOSIS — A401 Sepsis due to streptococcus, group B: Secondary | ICD-10-CM | POA: Diagnosis not present

## 2018-01-05 DIAGNOSIS — E11649 Type 2 diabetes mellitus with hypoglycemia without coma: Secondary | ICD-10-CM | POA: Diagnosis not present

## 2018-01-05 DIAGNOSIS — E1122 Type 2 diabetes mellitus with diabetic chronic kidney disease: Secondary | ICD-10-CM | POA: Diagnosis not present

## 2018-01-05 DIAGNOSIS — I2510 Atherosclerotic heart disease of native coronary artery without angina pectoris: Secondary | ICD-10-CM | POA: Diagnosis not present

## 2018-01-05 DIAGNOSIS — N183 Chronic kidney disease, stage 3 (moderate): Secondary | ICD-10-CM | POA: Diagnosis not present

## 2018-01-05 DIAGNOSIS — I130 Hypertensive heart and chronic kidney disease with heart failure and stage 1 through stage 4 chronic kidney disease, or unspecified chronic kidney disease: Secondary | ICD-10-CM | POA: Diagnosis not present

## 2018-01-05 DIAGNOSIS — I5032 Chronic diastolic (congestive) heart failure: Secondary | ICD-10-CM | POA: Diagnosis not present

## 2018-01-06 ENCOUNTER — Ambulatory Visit: Payer: Medicare HMO | Admitting: Nurse Practitioner

## 2018-01-06 ENCOUNTER — Telehealth: Payer: Self-pay | Admitting: Endocrinology

## 2018-01-06 ENCOUNTER — Encounter: Payer: Self-pay | Admitting: Nurse Practitioner

## 2018-01-06 VITALS — BP 100/58 | HR 82 | Ht 70.0 in | Wt 319.4 lb

## 2018-01-06 DIAGNOSIS — I480 Paroxysmal atrial fibrillation: Secondary | ICD-10-CM

## 2018-01-06 DIAGNOSIS — I5042 Chronic combined systolic (congestive) and diastolic (congestive) heart failure: Secondary | ICD-10-CM | POA: Diagnosis not present

## 2018-01-06 DIAGNOSIS — Z6841 Body Mass Index (BMI) 40.0 and over, adult: Secondary | ICD-10-CM | POA: Diagnosis not present

## 2018-01-06 DIAGNOSIS — I110 Hypertensive heart disease with heart failure: Secondary | ICD-10-CM | POA: Diagnosis not present

## 2018-01-06 MED ORDER — FUROSEMIDE 40 MG PO TABS: 40.0000 mg | ORAL_TABLET | Freq: Two times a day (BID) | ORAL | 3 refills | Status: DC

## 2018-01-06 MED ORDER — AMIODARONE HCL 200 MG PO TABS: 200.0000 mg | ORAL_TABLET | Freq: Every day | ORAL | 3 refills | Status: DC

## 2018-01-06 MED ORDER — APIXABAN 5 MG PO TABS: 5.0000 mg | ORAL_TABLET | Freq: Two times a day (BID) | ORAL | 3 refills | Status: DC

## 2018-01-06 MED ORDER — METOPROLOL TARTRATE 25 MG PO TABS: 25.0000 mg | ORAL_TABLET | Freq: Two times a day (BID) | ORAL | 3 refills | Status: DC

## 2018-01-06 NOTE — Progress Notes (Addendum)
CARDIOLOGY OFFICE NOTE  Date:  01/06/2018    [name] Date of Birth: 01/10/1941 Medical Record #[id number]  PCP:  Renato Shin, MD  Cardiologist:  [facility]   Chief Complaint  Patient presents with  . Congestive Heart Failure  . Atrial Fibrillation    Follow up/post hospital visit - seen for Dr. Rayann Heman    History of Present Illness: [name] is a 77 y.o. male who presents today for a follow up/post hospital visit. Seen for Dr. Rayann Heman.   He has a history of diastolic HF, morbid obesity, PAF with past cardioversion, venous insufficiency, CKD, anemia, hypothyroidism, DM, COPD, OSA on CPAP, HLD, SSS with PPM in place, CAD (no specifics noted), HTN and depression. On chronic anticoagulation with Eliquis.   He has had several cardioversions - last in Julyof 2016. He is now on amiodarone. Dr. Rayann Heman and I both have felt like he is not really any more symptomatic from his baseline regardless of his rhythm but Turki has preferred to be in NSR.  Our plan has been that If he remains in sinus, will continue on amiodarone '200mg'$  daily. IF he does not convert or does not have significant clinical improve with cardioversion, it may be better to stop amiodarone and use a rate control strategy long term.  Ilast saw him back in November of 2017 - it was still pretty hard to say if how he feels is in any way related to what his rhythm is - but he was in NSR.  Saw Dr. Rayann Heman back in May - felt to be stable.  Admitted back in August with respiratory failure/COPD exacerbation. Treated with antibiotics, prednisone, nebs and oxygen. Echo updated - EF down now to 45 to 50% - was 55% in 2015. Appeared to be euvolemic. Troponins were flat/mildly elevated - felt to be due to respiratory failure in the setting of CKD.   I then saw him in November - remained short of breath. Lots of cough. Tenuous situation. Not on optimal therapy and to try and put him on a better CHF regimen  would need more frequent labs and transportation is such a big issue for him. I elected to increase his Lasix at last visit.   Admitted again earlier this month - sepsis with strep bacteremia - unclear if due to ulcerations on his legs or from his device - he is on a prolonged course of antibiotics. Was not able to tolerate echo. TEE recommended but high risk for decompensation. Prolonged course of antibiotics given and he is to follow up with ID.   Comes back today. Here alone.Looks poorly but says he is getting better. Has PICC line in place - getting daily antibiotics for total of 4 weeks. He did not to seem to know that he would be seeing ID next month. Says his breathing is improving - still with little cough and congestion but "better than last week". No fever since he has been back home. Wanting to simply his medicines. He would like to take Lasix 40 mg BID rather than '60mg'$  and '20mg'$ . He admits he forgets his middle dose of metoprolol - asking if he can just do BID. He feels like his swelling is a little better. No chest pain.   Past Medical History:  Diagnosis Date  . Anemia    hx of  . Anxiety   . CAD (coronary artery disease)   . Cancer [facility]) 2014   bladder cancer AND RIGHT  URETERAL CANCER  . COPD (chronic obstructive pulmonary disease) (HCC)    CPAP  . Depression   . Diabetes mellitus    TypeII  . Diastolic dysfunction with chronic heart failure ([place])   . Gait difficulty    slow gait"ambulates with walker"  . GERD (gastroesophageal reflux disease)   . 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 H/O Legionnaire's disease 2003  . History of blood clots    R groin  . History of kidney stones   . Hyperkalemia   . Hyperlipemia   . Hypertensive cardiovascular-renal disease   . Hypothyroidism   . Morbid obesity ([place])   . OSA on CPAP    using CPAP although it is hard for him to tolerate  . Osteoarthritis    fingers  . Pancreatitis   . Peripheral neuropathy   .  Persistent atrial fibrillation ([place])   . Pneumonia 2003  . Renal insufficiency   . Shortness of breath dyspnea    with exertion  . Sick sinus syndrome ([place]) 06/2010   s/p PPM by [employer]  . Venous insufficiency     Past Surgical History:  Procedure Laterality Date  . BELPHAROPTOSIS REPAIR Right    Glaucoma  . CARDIAC CATHETERIZATION     11-02-13  . CARDIOVERSION N/A 05/16/2014   Procedure: CARDIOVERSION;  Surgeon: Josue Hector, MD;  Location: [facility] ENDOSCOPY;  Service: Cardiovascular;  Laterality: N/A;  . CARDIOVERSION N/A 11/08/2014   Procedure: CARDIOVERSION;  Surgeon: Fay Records, MD;  Location: [place] [facility];  Service: Cardiovascular;  Laterality: N/A;  . CARDIOVERSION N/A 06/22/2015   Procedure: CARDIOVERSION;  Surgeon: Jerline Pain, MD;  Location: [facility];  Service: Cardiovascular;  Laterality: N/A;  . CHOLECYSTECTOMY  2012  . CYSTOSCOPY W/ URETERAL STENT PLACEMENT Right 10/26/2013   Procedure: CYSTOSCOPY WITH RETROGRADE PYELOGRAM/URETERAL STENT PLACEMENT;  Surgeon: Alexis Frock, MD;  Location: [facility];  Service: Urology;  Laterality: Right;  . CYSTOSCOPY WITH URETEROSCOPY AND STENT PLACEMENT Right 01/11/2014   Procedure: CYSTOSCOPY WITH URETEROSCOPY ,RIGHT RETROGRADE AND STENT CHANGE AND LASER OF URETERAL TUMOR;  Surgeon: Alexis Frock, MD;  Location: [facility];  Service: Urology;  Laterality: Right;  . CYSTOSCOPY/RETROGRADE/URETEROSCOPY Right 08/23/2014   Procedure: CYSTOSCOPY/RETROGRADE/ DIAGNOSTIC URETEROSCOPY/RIGHT RENAL STONE EXTRACTION;  Surgeon: Alexis Frock, MD;  Location: [facility];  Service: Urology;  Laterality: Right;  . EMBOLECTOMY Left 11/02/2013   Procedure: LEFT FEMORAL EMBOLECTOMY, LEFT FEMORAL ARTERY ENDARTERECTOMY WITH DACRON PATCH ANGIOPLASTY.;  Surgeon: Mal Misty, MD;  Location: [facility];  Service: Vascular;  Laterality: Left;  . EYE SURGERY Left    cataract  . GROIN DEBRIDEMENT Right 05/08/2015   Procedure: REMOVAL OF RIGHT GROIN MASS;  Surgeon: Angelia Mould, MD;  Location: [facility];  Service: Vascular;  Laterality: Right;  . HOLMIUM LASER APPLICATION Right 0/08/6282   Procedure: HOLMIUM LASER APPLICATION;  Surgeon: Alexis Frock, MD;  Location: [facility];  Service: Urology;  Laterality: Right;  . INSERT / REPLACE / REMOVE PACEMAKER  2011  . NASAL SEPTUM SURGERY  1967  . PACEMAKER PLACEMENT  06/2010   [facility] Accent RF DR, Model [id number] ( Serial number [id number])  . TEE WITHOUT CARDIOVERSION N/A 05/16/2014   Procedure: TRANSESOPHAGEAL ECHOCARDIOGRAM (TEE);  Surgeon: Josue Hector, MD;  Location: [facility] ENDOSCOPY;  Service: Cardiovascular;  Laterality: N/A;  . thromboembolectomy and four compartment fasciotomy Right 2009   leg  . TRANSURETHRAL RESECTION OF BLADDER TUMOR WITH GYRUS (TURBT-GYRUS) N/A  10/26/2013   Procedure: TRANSURETHRAL RESECTION OF BLADDER TUMOR WITH GYRUS (TURBT-GYRUS);  Surgeon: Alexis Frock, MD;  [facility];  Service: Urology;  Laterality: N/A;  . TRANSURETHRAL RESECTION OF BLADDER TUMOR WITH GYRUS (TURBT-GYRUS) N/A 01/11/2014   Procedure: TRANSURETHRAL RESECTION OF BLADDER TUMOR WITH GYRUS (TURBT-GYRUS);  Surgeon: Alexis Frock, MD;  [facility];  Service: Urology;  Laterality: N/A;  . WISDOM TOOTH EXTRACTION       Medications: Current Meds  Medication Sig  . acetaminophen (TYLENOL) 500 MG tablet Take 1,000 mg by mouth 2 (two) times daily as needed (pain/headache).   Marland Kitchen albuterol (VENTOLIN HFA) 108 (90 Base) MCG/ACT inhaler Inhale 2 puffs into the lungs every 6 (six) hours as needed for wheezing or shortness of breath.  Marland Kitchen amiodarone (PACERONE) 200 MG tablet Take 200 mg by mouth daily.  Marland Kitchen apixaban (ELIQUIS) 5 MG TABS tablet Take 1 tablet (5 mg total) by mouth 2 (two) times daily.  . Ascorbic Acid (VITAMIN C) 500 MG tablet Take 500 mg by mouth daily.   . brimonidine (ALPHAGAN P) 0.1 % SOLN Place 1 drop into the left eye 3 (three) times daily.   . budesonide-formoterol (SYMBICORT) 80-4.5 MCG/ACT inhaler Inhale  into the lungs 2 (two) times daily.  . calcium carbonate (TUMS - DOSED IN MG ELEMENTAL CALCIUM) 500 MG chewable tablet Chew 2 tablets by mouth 2 (two) times daily as needed for indigestion or heartburn.  . cefTRIAXone (ROCEPHIN) IVPB Inject 2 g into the vein daily. Indication:  Strep bacteremia, has pacemaker Last Day of Therapy:  01/26/18 Labs - Once weekly:  CBC/D and BMP, Labs - Every other week:  ESR and CRP  . cholecalciferol (VITAMIN D) 1000 UNITS tablet Take 1,000 Units by mouth 2 (two) times daily.   Marland Kitchen CINNAMON PO Take 2,000 mg by mouth daily.  Marland Kitchen dextromethorphan (DELSYM) 30 MG/5ML liquid Take 90 mg by mouth 2 (two) times daily as needed for cough.  . Digestive Enzymes (MULTI-ENZYME) TABS Take 1 tablet by mouth daily.  Marland Kitchen docusate sodium (COLACE) 100 MG capsule Take 100-200 mg by mouth daily as needed for mild constipation.   . dorzolamide (TRUSOPT) 2 % ophthalmic solution Place 1 drop into both eyes 2 (two) times daily.  . fluticasone (FLONASE) 50 MCG/ACT nasal spray USE 2 SPRAYS IN EACN NOSTRIL DAILY (Patient taking differently: Place 2 sprays into both nostrils daily as needed for allergies or rhinitis. )  . fluticasone (FLONASE) 50 MCG/ACT nasal spray Place 2 sprays into both nostrils daily.  . furosemide (LASIX) 40 MG tablet Take 60 mg by mouth 2 (two) times daily. One tablet and half (60 mg ) am 1/2 tablet (20 mg) pm  . guaiFENesin (MUCINEX) 600 MG 12 hr tablet Take 600 mg by mouth 2 (two) times daily as needed for cough or to loosen phlegm.  . insulin regular (NOVOLIN R) 100 units/mL injection 4 times a day (just before each meal), 300 units with breakfast, 210 units with lunch, 220 units  at supper, and 190 units with bedtime snack. (Patient taking differently: Inject 100-300 Units into the skin See admin instructions. Inject 300 units subcutaneously before breakfast, 240 units before lunch, 220 units before supper and 100-200 units at bedtime)  . Lactobacillus (ACIDOPHILUS) CAPS  capsule Take 1 capsule by mouth every evening.   Marland Kitchen levothyroxine (SYNTHROID, LEVOTHROID) 75 MCG tablet Take 1 tablet (75 mcg total) by mouth daily before breakfast.  . metoprolol tartrate (LOPRESSOR) 25 MG tablet Take 1 tablet (25 mg  total) by mouth 3 (three) times daily.  . Multiple Vitamin (MULTIVITAMIN WITH MINERALS) TABS tablet Take 1 tablet by mouth every evening.   Mckinley Jewel Dimesylate (RHOPRESSA) 0.02 % SOLN Place 1 drop into the left eye daily.  . Omega-3 Fatty Acids (FISH OIL) 1000 MG CAPS Take 1,000 mg by mouth daily.   Marland Kitchen omeprazole (PRILOSEC) 40 MG capsule Take 40 mg by mouth daily.  Marland Kitchen Resveratrol 100 MG CAPS Take 100 mg by mouth every evening.  . rosuvastatin (CRESTOR) 40 MG tablet Take 20 mg by mouth every evening.  . Timolol Maleate 0.5 % (DAILY) SOLN Place 1 drop into both eyes 2 (two) times daily.  . traMADol (ULTRAM) 50 MG tablet TAKE 2 TABLETS BY MOUTH TWICE A DAY AS NEEDED FOR PAIN (Patient taking differently: TAKE 2 TABLETS (100 MG)  BY MOUTH TWICE A DAY AS NEEDED FOR PAIN)  . traMADol (ULTRAM) 50 MG tablet Take 100 mg by mouth 2 (two) times daily. As needed for pain  . travoprost, benzalkonium, (TRAVATAN) 0.004 % ophthalmic solution Place 1 drop into the left eye at bedtime.     Allergies: Allergies  Allergen Reactions  . Actos [Pioglitazone] Swelling  . Timolol Other (See Comments)    Slow heart rate but tolerates Cosopt (dorzolamide-timolol)    Social History: The patient  reports that he quit smoking about 11 years ago. His smoking use included cigarettes. He has a 100.00 pack-year smoking history. he has never used smokeless tobacco. He reports that he does not drink alcohol or use drugs.   Family History: The patient's family history includes Cancer in his mother; Heart attack in his father; Liver cancer in his mother.   Review of Systems: Please see the history of present illness.   Otherwise, the review of systems is positive for none.   All other systems  are reviewed and negative.   Physical Exam: VS:  BP (!) 100/58 (BP Location: Left Arm, Patient Position: Sitting, Cuff Size: Large)   Pulse 82   Ht '5\' 10"'$  (1.778 m)   Wt (!) 319 lb 6.4 oz (144.9 kg)   SpO2 96%   BMI 45.83 kg/m  .  BMI Body mass index is 45.83 kg/m.  Wt Readings from Last 3 Encounters:  01/06/18 (!) 319 lb 6.4 oz (144.9 kg)  01/01/18 (!) 331 lb 6.4 oz (150.3 kg)  11/23/17 (!) 323 lb 12.8 oz (146.9 kg)    General: Pleasant. Morbidly obese. Chronically ill appearing but alert and in no acute distress.  He is hard of hearing.  HEENT: Normal.  Neck: Supple, no JVD, carotid bruits, or masses noted.  Cardiac: Heart tones are very distant. Legs are chronically full with edema - he has dressings in place.  Respiratory:  Decreased breath sounds noted but with normal work of breathing.  GI: Very large pannus.   MS: No deformity or atrophy. Gait and ROM intact. Using a walker.  Skin: Warm and dry. Color is normal.  Neuro:  Strength and sensation are intact and no gross focal deficits noted.  Psych: Alert, appropriate and with normal affect.   LABORATORY DATA:  EKG:  EKG is not ordered today.  Lab Results  Component Value Date   WBC 9.1 01/02/2018   HGB 10.0 (L) 01/02/2018   HCT 33.1 (L) 01/02/2018   PLT 273 01/02/2018   GLUCOSE 225 (H) 01/02/2018   CHOL 133 05/06/2017   TRIG 396.0 (H) 05/06/2017   HDL 39.70 05/06/2017   LDLDIRECT 59.0 05/06/2017  [place]  05/07/2008    UNABLE TO CALCULATE IF TRIGLYCERIDE OVER 400 mg/dL        Total Cholesterol/HDL:CHD Risk Coronary Heart Disease Risk Table                     Men   Women  1/2 Average Risk   3.4   3.3   ALT 29 12/30/2017   AST 48 (H) 12/30/2017   NA 134 (L) 01/02/2018   K 5.1 01/02/2018   CL 102 01/02/2018   CREATININE 1.39 (H) 01/02/2018   BUN 24 (H) 01/02/2018   CO2 22 01/02/2018   TSH 3.010 10/26/2017   PSA 0.73 05/06/2017   INR 1.33 07/28/2017   HGBA1C 7.4 11/11/2017   MICROALBUR 1.6 11/11/2017       BNP (last 3 results) Recent Labs    07/28/17 1530 12/28/17 1751  BNP 173.7* 767.4*    ProBNP (last 3 results) Recent Labs    10/26/17 1425  PROBNP 1,148*     Other Studies Reviewed Today:  Echo Study Conclusions August 2018  - Left ventricle: Abnromal septal motion apical and inferior hypokinesis poor image quality. The cavity size was normal. Wall thickness was increased in a pattern of mild LVH. Systolic function was mildly reduced. The estimated ejection fraction was in the range of 45% to 50%. Left ventricular diastolic function parameters were normal. - Aortic valve: There was moderate stenosis. Valve area (VTI): 0.92 cm^2. Valve area (Vmax): 0.93 cm^2. Valve area (Vmean): 0.83 cm^2. - Left atrium: The atrium was moderately dilated. - Right ventricle: Pacer wire or catheter noted in right ventricle. - Atrial septum: No defect or patent foramen ovale was identified.  Impressions:  - There are not enough images available for an interpretation. Need to complete echo or will need to send all images to system, not sure which is the problem.  Assessment/Plan:  1. Recent bout of sepsis/strep bacteremia - on antibiotics for 4 weeks. Then to see ID. No complaints of fever. Hopefully this is not a device infection.   2. 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 He has trouble splitting his Lasix - will go back to just 40 mg BID. Would favor more of a conservative approach.   3. PAF - on amiodarone - has PPM in place - cutting Metoprolol back to just BID to simplify his regimen - he is missing the middle dose most days anyway. BP pretty soft.   4. High risk medicine   5. OSA - followed by pulmonary. Says he is wearing his CPAP.   6. Morbid obesity - this is a big part of his issues - I do not see this changing.   7. HTN - BP ok on current regimen.   8. Moderate AS -  no cardinal symptoms  Current medicines are reviewed with the patient today.  The patient does not have concerns regarding medicines other than what has been noted above.  The following changes have been made:  See above.  Labs/ tests ordered today include:   No orders of the defined types were placed in this encounter.    Disposition:   FU with me in about 2 months.  I think his overall prognosis is pretty tenuous at best.   Patient is agreeable to this plan and will call if any problems develop in the interim.   SignedTruitt Merle, NP  01/06/2018  3:19 PM  [facility] [address] [place], [place]  [address] Phone: [phone] Fax: [phone]      Addendum: From Dr. Rivka Spring, MD sent to Burtis Junes, NP        Thanks   We just need to make sure he gets repeat blood cultures once antibiotics have been discontinued. Though I suspect ID will, just make sure when you see in 2 months that they have.    TY!!

## 2018-01-06 NOTE — Telephone Encounter (Signed)
[name] - Physical Thera pist ph# [phone] requests the following info: Verbal order approval for PT 2 Week 4 twice a week for 4 weeks.

## 2018-01-06 NOTE — Patient Instructions (Addendum)
We will be checking the following labs today - NONE   Medication Instructions:    Continue with your current medicines.     Testing/Procedures To Be Arranged:  N/A  Follow-Up:   See me in 2 months    Other Special Instructions:   N/A    If you need a refill on your cardiac medications before your next appointment, please call your pharmacy.   Call the [place] office at [phone] if you have any questions, problems or concerns.

## 2018-01-07 DIAGNOSIS — J449 Chronic obstructive pulmonary disease, unspecified: Secondary | ICD-10-CM | POA: Diagnosis not present

## 2018-01-07 DIAGNOSIS — J9621 Acute and chronic respiratory failure with hypoxia: Secondary | ICD-10-CM | POA: Diagnosis not present

## 2018-01-07 NOTE — Telephone Encounter (Signed)
OK 

## 2018-01-07 NOTE — Telephone Encounter (Signed)
I called and left VM with Bethel Born & stated that I was calling to give verbal orders for PT. I asked that if she needed to speak with me or had more questions to call back.

## 2018-01-08 DIAGNOSIS — J449 Chronic obstructive pulmonary disease, unspecified: Secondary | ICD-10-CM | POA: Diagnosis not present

## 2018-01-08 DIAGNOSIS — I5032 Chronic diastolic (congestive) heart failure: Secondary | ICD-10-CM | POA: Diagnosis not present

## 2018-01-08 DIAGNOSIS — N183 Chronic kidney disease, stage 3 (moderate): Secondary | ICD-10-CM | POA: Diagnosis not present

## 2018-01-08 DIAGNOSIS — A401 Sepsis due to streptococcus, group B: Secondary | ICD-10-CM | POA: Diagnosis not present

## 2018-01-08 DIAGNOSIS — Z452 Encounter for adjustment and management of vascular access device: Secondary | ICD-10-CM | POA: Diagnosis not present

## 2018-01-08 DIAGNOSIS — E1122 Type 2 diabetes mellitus with diabetic chronic kidney disease: Secondary | ICD-10-CM | POA: Diagnosis not present

## 2018-01-08 DIAGNOSIS — I2510 Atherosclerotic heart disease of native coronary artery without angina pectoris: Secondary | ICD-10-CM | POA: Diagnosis not present

## 2018-01-08 DIAGNOSIS — E11649 Type 2 diabetes mellitus with hypoglycemia without coma: Secondary | ICD-10-CM | POA: Diagnosis not present

## 2018-01-08 DIAGNOSIS — I130 Hypertensive heart and chronic kidney disease with heart failure and stage 1 through stage 4 chronic kidney disease, or unspecified chronic kidney disease: Secondary | ICD-10-CM | POA: Diagnosis not present

## 2018-01-11 DIAGNOSIS — I130 Hypertensive heart and chronic kidney disease with heart failure and stage 1 through stage 4 chronic kidney disease, or unspecified chronic kidney disease: Secondary | ICD-10-CM | POA: Diagnosis not present

## 2018-01-11 DIAGNOSIS — I5032 Chronic diastolic (congestive) heart failure: Secondary | ICD-10-CM | POA: Diagnosis not present

## 2018-01-11 DIAGNOSIS — E11649 Type 2 diabetes mellitus with hypoglycemia without coma: Secondary | ICD-10-CM | POA: Diagnosis not present

## 2018-01-11 DIAGNOSIS — J9621 Acute and chronic respiratory failure with hypoxia: Secondary | ICD-10-CM | POA: Diagnosis not present

## 2018-01-11 DIAGNOSIS — E1122 Type 2 diabetes mellitus with diabetic chronic kidney disease: Secondary | ICD-10-CM | POA: Diagnosis not present

## 2018-01-11 DIAGNOSIS — A401 Sepsis due to streptococcus, group B: Secondary | ICD-10-CM | POA: Diagnosis not present

## 2018-01-11 DIAGNOSIS — Z452 Encounter for adjustment and management of vascular access device: Secondary | ICD-10-CM | POA: Diagnosis not present

## 2018-01-11 DIAGNOSIS — I2510 Atherosclerotic heart disease of native coronary artery without angina pectoris: Secondary | ICD-10-CM | POA: Diagnosis not present

## 2018-01-11 DIAGNOSIS — N183 Chronic kidney disease, stage 3 (moderate): Secondary | ICD-10-CM | POA: Diagnosis not present

## 2018-01-11 DIAGNOSIS — J449 Chronic obstructive pulmonary disease, unspecified: Secondary | ICD-10-CM | POA: Diagnosis not present

## 2018-01-12 NOTE — Telephone Encounter (Deleted)
Tracey chambers,calling from [facility] stated that they received his labs and they are abnormal levels. She also said patient has wounds on his legs and she want to know if she can dress them . Please advise.  [phone]

## 2018-01-13 DIAGNOSIS — Z452 Encounter for adjustment and management of vascular access device: Secondary | ICD-10-CM | POA: Diagnosis not present

## 2018-01-13 DIAGNOSIS — I2510 Atherosclerotic heart disease of native coronary artery without angina pectoris: Secondary | ICD-10-CM | POA: Diagnosis not present

## 2018-01-13 DIAGNOSIS — J449 Chronic obstructive pulmonary disease, unspecified: Secondary | ICD-10-CM | POA: Diagnosis not present

## 2018-01-13 DIAGNOSIS — E1122 Type 2 diabetes mellitus with diabetic chronic kidney disease: Secondary | ICD-10-CM | POA: Diagnosis not present

## 2018-01-13 DIAGNOSIS — I5032 Chronic diastolic (congestive) heart failure: Secondary | ICD-10-CM | POA: Diagnosis not present

## 2018-01-13 DIAGNOSIS — E11649 Type 2 diabetes mellitus with hypoglycemia without coma: Secondary | ICD-10-CM | POA: Diagnosis not present

## 2018-01-13 DIAGNOSIS — N183 Chronic kidney disease, stage 3 (moderate): Secondary | ICD-10-CM | POA: Diagnosis not present

## 2018-01-13 DIAGNOSIS — I130 Hypertensive heart and chronic kidney disease with heart failure and stage 1 through stage 4 chronic kidney disease, or unspecified chronic kidney disease: Secondary | ICD-10-CM | POA: Diagnosis not present

## 2018-01-13 DIAGNOSIS — A401 Sepsis due to streptococcus, group B: Secondary | ICD-10-CM | POA: Diagnosis not present

## 2018-01-14 DIAGNOSIS — A401 Sepsis due to streptococcus, group B: Secondary | ICD-10-CM | POA: Diagnosis not present

## 2018-01-14 DIAGNOSIS — I2510 Atherosclerotic heart disease of native coronary artery without angina pectoris: Secondary | ICD-10-CM | POA: Diagnosis not present

## 2018-01-14 DIAGNOSIS — E11649 Type 2 diabetes mellitus with hypoglycemia without coma: Secondary | ICD-10-CM | POA: Diagnosis not present

## 2018-01-14 DIAGNOSIS — N183 Chronic kidney disease, stage 3 (moderate): Secondary | ICD-10-CM | POA: Diagnosis not present

## 2018-01-14 DIAGNOSIS — I130 Hypertensive heart and chronic kidney disease with heart failure and stage 1 through stage 4 chronic kidney disease, or unspecified chronic kidney disease: Secondary | ICD-10-CM | POA: Diagnosis not present

## 2018-01-14 DIAGNOSIS — Z452 Encounter for adjustment and management of vascular access device: Secondary | ICD-10-CM | POA: Diagnosis not present

## 2018-01-14 DIAGNOSIS — J449 Chronic obstructive pulmonary disease, unspecified: Secondary | ICD-10-CM | POA: Diagnosis not present

## 2018-01-14 DIAGNOSIS — E1122 Type 2 diabetes mellitus with diabetic chronic kidney disease: Secondary | ICD-10-CM | POA: Diagnosis not present

## 2018-01-14 DIAGNOSIS — I5032 Chronic diastolic (congestive) heart failure: Secondary | ICD-10-CM | POA: Diagnosis not present

## 2018-01-15 DIAGNOSIS — J449 Chronic obstructive pulmonary disease, unspecified: Secondary | ICD-10-CM | POA: Diagnosis not present

## 2018-01-15 DIAGNOSIS — J9621 Acute and chronic respiratory failure with hypoxia: Secondary | ICD-10-CM | POA: Diagnosis not present

## 2018-01-18 DIAGNOSIS — A401 Sepsis due to streptococcus, group B: Secondary | ICD-10-CM | POA: Diagnosis not present

## 2018-01-18 DIAGNOSIS — N183 Chronic kidney disease, stage 3 (moderate): Secondary | ICD-10-CM | POA: Diagnosis not present

## 2018-01-18 DIAGNOSIS — Z452 Encounter for adjustment and management of vascular access device: Secondary | ICD-10-CM | POA: Diagnosis not present

## 2018-01-18 DIAGNOSIS — I2510 Atherosclerotic heart disease of native coronary artery without angina pectoris: Secondary | ICD-10-CM | POA: Diagnosis not present

## 2018-01-18 DIAGNOSIS — I5032 Chronic diastolic (congestive) heart failure: Secondary | ICD-10-CM | POA: Diagnosis not present

## 2018-01-18 DIAGNOSIS — E1122 Type 2 diabetes mellitus with diabetic chronic kidney disease: Secondary | ICD-10-CM | POA: Diagnosis not present

## 2018-01-18 DIAGNOSIS — I130 Hypertensive heart and chronic kidney disease with heart failure and stage 1 through stage 4 chronic kidney disease, or unspecified chronic kidney disease: Secondary | ICD-10-CM | POA: Diagnosis not present

## 2018-01-18 DIAGNOSIS — J9621 Acute and chronic respiratory failure with hypoxia: Secondary | ICD-10-CM | POA: Diagnosis not present

## 2018-01-18 DIAGNOSIS — J449 Chronic obstructive pulmonary disease, unspecified: Secondary | ICD-10-CM | POA: Diagnosis not present

## 2018-01-18 DIAGNOSIS — E11649 Type 2 diabetes mellitus with hypoglycemia without coma: Secondary | ICD-10-CM | POA: Diagnosis not present

## 2018-01-21 DIAGNOSIS — I130 Hypertensive heart and chronic kidney disease with heart failure and stage 1 through stage 4 chronic kidney disease, or unspecified chronic kidney disease: Secondary | ICD-10-CM | POA: Diagnosis not present

## 2018-01-21 DIAGNOSIS — A401 Sepsis due to streptococcus, group B: Secondary | ICD-10-CM | POA: Diagnosis not present

## 2018-01-21 DIAGNOSIS — Z452 Encounter for adjustment and management of vascular access device: Secondary | ICD-10-CM | POA: Diagnosis not present

## 2018-01-21 DIAGNOSIS — N183 Chronic kidney disease, stage 3 (moderate): Secondary | ICD-10-CM | POA: Diagnosis not present

## 2018-01-21 DIAGNOSIS — E11649 Type 2 diabetes mellitus with hypoglycemia without coma: Secondary | ICD-10-CM | POA: Diagnosis not present

## 2018-01-21 DIAGNOSIS — I5032 Chronic diastolic (congestive) heart failure: Secondary | ICD-10-CM | POA: Diagnosis not present

## 2018-01-21 DIAGNOSIS — J449 Chronic obstructive pulmonary disease, unspecified: Secondary | ICD-10-CM | POA: Diagnosis not present

## 2018-01-21 DIAGNOSIS — I2510 Atherosclerotic heart disease of native coronary artery without angina pectoris: Secondary | ICD-10-CM | POA: Diagnosis not present

## 2018-01-21 DIAGNOSIS — E1122 Type 2 diabetes mellitus with diabetic chronic kidney disease: Secondary | ICD-10-CM | POA: Diagnosis not present

## 2018-01-25 DIAGNOSIS — I5032 Chronic diastolic (congestive) heart failure: Secondary | ICD-10-CM | POA: Diagnosis not present

## 2018-01-25 DIAGNOSIS — A401 Sepsis due to streptococcus, group B: Secondary | ICD-10-CM | POA: Diagnosis not present

## 2018-01-25 DIAGNOSIS — N183 Chronic kidney disease, stage 3 (moderate): Secondary | ICD-10-CM | POA: Diagnosis not present

## 2018-01-25 DIAGNOSIS — Z452 Encounter for adjustment and management of vascular access device: Secondary | ICD-10-CM | POA: Diagnosis not present

## 2018-01-25 DIAGNOSIS — E1122 Type 2 diabetes mellitus with diabetic chronic kidney disease: Secondary | ICD-10-CM | POA: Diagnosis not present

## 2018-01-25 DIAGNOSIS — J449 Chronic obstructive pulmonary disease, unspecified: Secondary | ICD-10-CM | POA: Diagnosis not present

## 2018-01-25 DIAGNOSIS — E11649 Type 2 diabetes mellitus with hypoglycemia without coma: Secondary | ICD-10-CM | POA: Diagnosis not present

## 2018-01-25 DIAGNOSIS — I2510 Atherosclerotic heart disease of native coronary artery without angina pectoris: Secondary | ICD-10-CM | POA: Diagnosis not present

## 2018-01-25 DIAGNOSIS — J9621 Acute and chronic respiratory failure with hypoxia: Secondary | ICD-10-CM | POA: Diagnosis not present

## 2018-01-25 DIAGNOSIS — I130 Hypertensive heart and chronic kidney disease with heart failure and stage 1 through stage 4 chronic kidney disease, or unspecified chronic kidney disease: Secondary | ICD-10-CM | POA: Diagnosis not present

## 2018-01-27 ENCOUNTER — Ambulatory Visit (INDEPENDENT_AMBULATORY_CARE_PROVIDER_SITE_OTHER): Payer: Medicare HMO | Admitting: Endocrinology

## 2018-01-27 ENCOUNTER — Other Ambulatory Visit: Payer: Self-pay

## 2018-01-27 ENCOUNTER — Encounter: Payer: Self-pay | Admitting: Endocrinology

## 2018-01-27 VITALS — BP 102/62 | HR 74 | Wt 316.0 lb

## 2018-01-27 DIAGNOSIS — L97929 Non-pressure chronic ulcer of unspecified part of left lower leg with unspecified severity: Secondary | ICD-10-CM | POA: Diagnosis not present

## 2018-01-27 DIAGNOSIS — E1169 Type 2 diabetes mellitus with other specified complication: Secondary | ICD-10-CM

## 2018-01-27 DIAGNOSIS — E669 Obesity, unspecified: Secondary | ICD-10-CM | POA: Diagnosis not present

## 2018-01-27 DIAGNOSIS — L97919 Non-pressure chronic ulcer of unspecified part of right lower leg with unspecified severity: Secondary | ICD-10-CM | POA: Diagnosis not present

## 2018-01-27 DIAGNOSIS — E1122 Type 2 diabetes mellitus with diabetic chronic kidney disease: Secondary | ICD-10-CM

## 2018-01-27 DIAGNOSIS — D500 Iron deficiency anemia secondary to blood loss (chronic): Secondary | ICD-10-CM

## 2018-01-27 DIAGNOSIS — D509 Iron deficiency anemia, unspecified: Secondary | ICD-10-CM

## 2018-01-27 DIAGNOSIS — E7800 Pure hypercholesterolemia, unspecified: Secondary | ICD-10-CM

## 2018-01-27 DIAGNOSIS — R0600 Dyspnea, unspecified: Secondary | ICD-10-CM

## 2018-01-27 DIAGNOSIS — Z794 Long term (current) use of insulin: Secondary | ICD-10-CM

## 2018-01-27 DIAGNOSIS — N183 Chronic kidney disease, stage 3 (moderate): Secondary | ICD-10-CM

## 2018-01-27 LAB — POCT GLYCOSYLATED HEMOGLOBIN (HGB A1C): HEMOGLOBIN A1C: 7.6

## 2018-01-27 MED ORDER — INSULIN REGULAR HUMAN 100 UNIT/ML IJ SOLN: INTRAMUSCULAR | 3 refills | Status: DC

## 2018-01-27 MED ORDER — FLUTICASONE PROPIONATE 50 MCG/ACT NA SUSP: NASAL | 3 refills | Status: DC

## 2018-01-27 MED ORDER — ROSUVASTATIN CALCIUM 40 MG PO TABS: 20.0000 mg | ORAL_TABLET | Freq: Every evening | ORAL | 3 refills | Status: DC

## 2018-01-27 MED ORDER — LEVOTHYROXINE SODIUM 75 MCG PO TABS: 75.0000 ug | ORAL_TABLET | Freq: Every day | ORAL | 3 refills | Status: DC

## 2018-01-27 MED ORDER — OMEPRAZOLE 40 MG PO CPDR: 40.0000 mg | DELAYED_RELEASE_CAPSULE | Freq: Every day | ORAL | 3 refills | Status: DC

## 2018-01-27 NOTE — Patient Instructions (Addendum)
Please see a wound care specialist.  you will receive a phone call, about a day and time for an appointment. Please continue the same insulin. Please follow up with the infection specialist.  check your blood sugar 4 times a day: before the 3 meals, and at bedtime.  also check if you have symptoms of your blood sugar being too high or too low.  please keep a record of the readings and bring it to your next appointment here (or you can bring the meter itself).  You can write it on any piece of paper.  please call us sooner if your blood sugar goes below 70, or if you have a lot of readings over 200.

## 2018-01-27 NOTE — Progress Notes (Signed)
Subjective:    Patient ID: [name], male    DOB: December 03, 1941, 77 y.o.   MRN: [id number]  HPI Pt returns for f/u of diabetes mellitus:  DM type: Insulin-requiring type 2.  Dx'ed: 1988.  Complications: renal insufficiency, polyneuropathy, and CAD.   Therapy: insulin since 1989.  DKA: never.   Severe hypoglycemia: never.   Pancreatitis: several episodes, most recently in 2013.   Other: He brings a record of his cbg's which I have reviewed today.  It varies from 42-304.  It is still in general highest fasting, and lowest at HS.   Past Medical History:  Diagnosis Date  . Anemia    hx of  . Anxiety   . CAD (coronary artery disease)   . Cancer [facility]) 2014   bladder cancer AND RIGHT URETERAL CANCER  . COPD (chronic obstructive pulmonary disease) (HCC)    CPAP  . Depression   . Diabetes mellitus    TypeII  . Diastolic dysfunction with chronic heart failure ([place])   . Gait difficulty    slow gait"ambulates with walker"  . GERD (gastroesophageal reflux disease)   . 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 H/O Legionnaire's disease 2003  . History of blood clots    R groin  . History of kidney stones   . Hyperkalemia   . Hyperlipemia   . Hypertensive cardiovascular-renal disease   . Hypothyroidism   . Morbid obesity ([place])   . OSA on CPAP    using CPAP although it is hard for him to tolerate  . Osteoarthritis    fingers  . Pancreatitis   . Peripheral neuropathy   . Persistent atrial fibrillation ([place])   . Pneumonia 2003  . Renal insufficiency   . Shortness of breath dyspnea    with exertion  . Sick sinus syndrome ([place]) 06/2010   s/p PPM by [employer]  . Venous insufficiency     Past Surgical History:  Procedure Laterality Date  . BELPHAROPTOSIS REPAIR Right    Glaucoma  . CARDIAC CATHETERIZATION     11-02-13  . CARDIOVERSION N/A 05/16/2014   Procedure: CARDIOVERSION;  Surgeon: Josue Hector, MD;  Location: [facility] ENDOSCOPY;  Service: Cardiovascular;   Laterality: N/A;  . CARDIOVERSION N/A 11/08/2014   Procedure: CARDIOVERSION;  Surgeon: Fay Records, MD;  Location: [place];  Service: Cardiovascular;  Laterality: N/A;  . CARDIOVERSION N/A 06/22/2015   Procedure: CARDIOVERSION;  Surgeon: Jerline Pain, MD;  Location: [place];  Service: Cardiovascular;  Laterality: N/A;  . CHOLECYSTECTOMY  2012  . CYSTOSCOPY W/ URETERAL STENT PLACEMENT Right 10/26/2013   Procedure: CYSTOSCOPY WITH RETROGRADE PYELOGRAM/URETERAL STENT PLACEMENT;  Surgeon: Alexis Frock, MD;  Location: [facility];  Service: Urology;  Laterality: Right;  . CYSTOSCOPY WITH URETEROSCOPY AND STENT PLACEMENT Right 01/11/2014   Procedure: CYSTOSCOPY WITH URETEROSCOPY ,RIGHT RETROGRADE AND STENT CHANGE AND LASER OF URETERAL TUMOR;  Surgeon: Alexis Frock, MD;  Location: [facility];  Service: Urology;  Laterality: Right;  . CYSTOSCOPY/RETROGRADE/URETEROSCOPY Right 08/23/2014   Procedure: CYSTOSCOPY/RETROGRADE/ DIAGNOSTIC URETEROSCOPY/RIGHT RENAL STONE EXTRACTION;  Surgeon: Alexis Frock, MD;  Location: [facility];  Service: Urology;  Laterality: Right;  . EMBOLECTOMY Left 11/02/2013   Procedure: LEFT FEMORAL EMBOLECTOMY, LEFT FEMORAL ARTERY ENDARTERECTOMY WITH DACRON PATCH ANGIOPLASTY.;  Surgeon: Mal Misty, MD;  Location: [place];  Service: Vascular;  Laterality: Left;  . EYE SURGERY Left    cataract  . GROIN DEBRIDEMENT Right 05/08/2015  Procedure: REMOVAL OF RIGHT GROIN MASS;  Surgeon: Angelia Mould, MD;  Location: [place];  Service: Vascular;  Laterality: Right;  . HOLMIUM LASER APPLICATION Right 06/12/1949   Procedure: HOLMIUM LASER APPLICATION;  Surgeon: Alexis Frock, MD;  Location: [facility];  Service: Urology;  Laterality: Right;  . INSERT / REPLACE / REMOVE PACEMAKER  2011  . NASAL SEPTUM SURGERY  1967  . PACEMAKER PLACEMENT  06/2010   [facility] Accent RF DR, Model [id number] ( Serial number [id number])  . TEE WITHOUT CARDIOVERSION N/A 05/16/2014   Procedure: TRANSESOPHAGEAL  ECHOCARDIOGRAM (TEE);  Surgeon: Josue Hector, MD;  Location: [facility] ENDOSCOPY;  Service: Cardiovascular;  Laterality: N/A;  . thromboembolectomy and four compartment fasciotomy Right 2009   leg  . TRANSURETHRAL RESECTION OF BLADDER TUMOR WITH GYRUS (TURBT-GYRUS) N/A 10/26/2013   Procedure: TRANSURETHRAL RESECTION OF BLADDER TUMOR WITH GYRUS (TURBT-GYRUS);  Surgeon: Alexis Frock, MD;  Location: [facility];  Service: Urology;  Laterality: N/A;  . TRANSURETHRAL RESECTION OF BLADDER TUMOR WITH GYRUS (TURBT-GYRUS) N/A 01/11/2014   Procedure: TRANSURETHRAL RESECTION OF BLADDER TUMOR WITH GYRUS (TURBT-GYRUS);  Surgeon: Alexis Frock, MD;  Location: [facility];  Service: Urology;  Laterality: N/A;  . WISDOM TOOTH EXTRACTION      Social History   Socioeconomic History  . Marital status: Married    Spouse name: Not on file  . Number of children: Not on file  . Years of education: Not on file  . Highest education level: Not on file  Social Needs  . Financial resource strain: Not on file  . Food insecurity - worry: Not on file  . Food insecurity - inability: Not on file  . Transportation needs - medical: Not on file  . Transportation needs - non-medical: Not on file  Occupational History  . 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 Smoking status: Former Smoker    Packs/day: 2.00    Years: 50.00    Pack years: 100.00    Types: Cigarettes    Last attempt to quit: 12/08/2006    Years since quitting: 11.1  . Smokeless tobacco: Never Used  Substance and Sexual Activity  . Alcohol use: No    Alcohol/week: 0.0 oz    Comment: quit 3 years ago  . Drug use: No  . Sexual activity: Not Currently  Other Topics Concern  . Not on file  Social History Narrative  . Not on file    Current Outpatient Medications on File Prior to Visit  Medication Sig Dispense Refill  . acetaminophen (TYLENOL) 500 MG tablet Take 1,000 mg by mouth 2 (two) times daily as needed  (pain/headache).     Marland Kitchen albuterol (VENTOLIN HFA) 108 (90 Base) MCG/ACT inhaler Inhale 2 puffs into the lungs every 6 (six) hours as needed for wheezing or shortness of breath. 18 Inhaler 1  . amiodarone (PACERONE) 200 MG tablet TAKE 1 TABLET DAILY (Patient taking differently: TAKE 1 TABLET (200 MG) BY MOUTH DAILY) 90 tablet 3  . amiodarone (PACERONE) 200 MG tablet Take 1 tablet (200 mg total) by mouth daily. 90 tablet 3  . apixaban (ELIQUIS) 5 MG TABS tablet Take 1 tablet (5 mg total) by mouth 2 (two) times daily. 180 tablet 3  . Ascorbic Acid (VITAMIN C) 500 MG tablet Take 500 mg by mouth daily.     . brimonidine (ALPHAGAN P) 0.1 % SOLN Place 1 drop into the left eye 3 (three)  times daily.     . budesonide-formoterol (SYMBICORT) 80-4.5 MCG/ACT inhaler Inhale 2 puffs into the lungs 2 (two) times daily. 1 Inhaler 0  . budesonide-formoterol (SYMBICORT) 80-4.5 MCG/ACT inhaler Inhale into the lungs 2 (two) times daily.    . calcium carbonate (TUMS - DOSED IN MG ELEMENTAL CALCIUM) 500 MG chewable tablet Chew 2 tablets by mouth 2 (two) times daily as needed for indigestion or heartburn.    . cefTRIAXone (ROCEPHIN) IVPB Inject 2 g into the vein daily. Indication:  Strep bacteremia, has pacemaker Last Day of Therapy:  01/26/18 Labs - Once weekly:  CBC/D and BMP, Labs - Every other week:  ESR and CRP 25 Units 0  . cholecalciferol (VITAMIN D) 1000 UNITS tablet Take 1,000 Units by mouth 2 (two) times daily.     Marland Kitchen CINNAMON PO Take 2,000 mg by mouth daily.    Marland Kitchen dextromethorphan (DELSYM) 30 MG/5ML liquid Take 90 mg by mouth 2 (two) times daily as needed for cough.    . Digestive Enzymes (MULTI-ENZYME) TABS Take 1 tablet by mouth daily.    Marland Kitchen docusate sodium (COLACE) 100 MG capsule Take 100-200 mg by mouth daily as needed for mild constipation.     . dorzolamide (TRUSOPT) 2 % ophthalmic solution Place 1 drop into both eyes 2 (two) times daily.    . fluticasone (FLONASE) 50 MCG/ACT nasal spray Place 2 sprays into  both nostrils daily.    . furosemide (LASIX) 40 MG tablet Take 1 tablet (40 mg total) by mouth 2 (two) times daily. 180 tablet 3  . guaiFENesin (MUCINEX) 600 MG 12 hr tablet Take 600 mg by mouth 2 (two) times daily as needed for cough or to loosen phlegm.    . Lactobacillus (ACIDOPHILUS) CAPS capsule Take 1 capsule by mouth every evening.     . metoprolol tartrate (LOPRESSOR) 25 MG tablet Take 1 tablet (25 mg total) by mouth 2 (two) times daily. 180 tablet 3  . Multiple Vitamin (MULTIVITAMIN WITH MINERALS) TABS tablet Take 1 tablet by mouth every evening.     Mckinley Jewel Dimesylate (RHOPRESSA) 0.02 % SOLN Place 1 drop into the left eye daily.    . Omega-3 Fatty Acids (FISH OIL) 1000 MG CAPS Take 1,000 mg by mouth daily.     . penicillin G IVPB Inject 3 Million Units into the vein every 4 (four) hours. Indication:  Strep bacteremia with pacemaker Last Day of Therapy:  01/26/18 Labs - Once weekly:  CBC/D and BMP, Labs - Every other week:  ESR and CRP 145 Units 0  . Resveratrol 100 MG CAPS Take 100 mg by mouth every evening.    . Timolol Maleate 0.5 % (DAILY) SOLN Place 1 drop into both eyes 2 (two) times daily.    . traMADol (ULTRAM) 50 MG tablet TAKE 2 TABLETS BY MOUTH TWICE A DAY AS NEEDED FOR PAIN (Patient taking differently: TAKE 2 TABLETS (100 MG)  BY MOUTH TWICE A DAY AS NEEDED FOR PAIN) 60 tablet 2  . traMADol (ULTRAM) 50 MG tablet Take 100 mg by mouth 2 (two) times daily. As needed for pain    . travoprost, benzalkonium, (TRAVATAN) 0.004 % ophthalmic solution Place 1 drop into the left eye at bedtime.     No current facility-administered medications on file prior to visit.     Allergies  Allergen Reactions  . Actos [Pioglitazone] Swelling  . Timolol Other (See Comments)    Slow heart rate but tolerates Cosopt (dorzolamide-timolol)    Family  History  Problem Relation Age of Onset  . Liver cancer Mother        deceased age 4  . Cancer Mother        liver cancer  . Heart attack  Father   . Colon cancer Neg Hx     BP 102/62 (BP Location: Left Arm, Patient Position: Sitting, Cuff Size: Normal)   Pulse 74   Wt (!) 316 lb (143.3 kg)   SpO2 96%   BMI 45.34 kg/m    Review of Systems Denies LOC    Objective:   Physical Exam VITAL SIGNS:  See vs page GENERAL: no distress Pulses: dorsalis pedis absent bilat (prob due to edema).  MSK: no deformity of the feet or ankles.   CV: 1+ bilat edema of the legs.   Skin:  Leg ulcers are bandaged  normal color and temp on the feet and ankles. Several healed surgical scars at the right leg. Neuro: sensation is intact to touch on the feet and ankles.   Ext: There is bilateral onychomycosis of the toenails.   A1c=7.8%  Creat=1.4 Hb=10 MCV=88     Assessment & Plan:  Leg ulcers, persistent.  Insulin-requiring type 2 DM, with CAD.  this is the best control this pt should aim for, given variable cbg's.   Anemia: slightly improved.  We'll follow.    Patient Instructions  Please see a wound care specialist.  you will receive a phone call, about a day and time for an appointment. Please continue the same insulin. Please follow up with the infection specialist.  check your blood sugar 4 times a day: before the 3 meals, and at bedtime.  also check if you have symptoms of your blood sugar being too high or too low.  please keep a record of the readings and bring it to your next appointment here (or you can bring the meter itself).  You can write it on any piece of paper.  please call us sooner if your blood sugar goes below 70, or if you have a lot of readings over 200.

## 2018-01-28 DIAGNOSIS — N183 Chronic kidney disease, stage 3 (moderate): Secondary | ICD-10-CM | POA: Diagnosis not present

## 2018-01-28 DIAGNOSIS — I5032 Chronic diastolic (congestive) heart failure: Secondary | ICD-10-CM | POA: Diagnosis not present

## 2018-01-28 DIAGNOSIS — I130 Hypertensive heart and chronic kidney disease with heart failure and stage 1 through stage 4 chronic kidney disease, or unspecified chronic kidney disease: Secondary | ICD-10-CM | POA: Diagnosis not present

## 2018-01-28 DIAGNOSIS — I2510 Atherosclerotic heart disease of native coronary artery without angina pectoris: Secondary | ICD-10-CM | POA: Diagnosis not present

## 2018-01-28 DIAGNOSIS — E11649 Type 2 diabetes mellitus with hypoglycemia without coma: Secondary | ICD-10-CM | POA: Diagnosis not present

## 2018-01-28 DIAGNOSIS — Z452 Encounter for adjustment and management of vascular access device: Secondary | ICD-10-CM | POA: Diagnosis not present

## 2018-01-28 DIAGNOSIS — A401 Sepsis due to streptococcus, group B: Secondary | ICD-10-CM | POA: Diagnosis not present

## 2018-01-28 DIAGNOSIS — J449 Chronic obstructive pulmonary disease, unspecified: Secondary | ICD-10-CM | POA: Diagnosis not present

## 2018-01-28 DIAGNOSIS — E1122 Type 2 diabetes mellitus with diabetic chronic kidney disease: Secondary | ICD-10-CM | POA: Diagnosis not present

## 2018-02-01 DIAGNOSIS — N183 Chronic kidney disease, stage 3 (moderate): Secondary | ICD-10-CM | POA: Diagnosis not present

## 2018-02-01 DIAGNOSIS — Z452 Encounter for adjustment and management of vascular access device: Secondary | ICD-10-CM | POA: Diagnosis not present

## 2018-02-01 DIAGNOSIS — E1122 Type 2 diabetes mellitus with diabetic chronic kidney disease: Secondary | ICD-10-CM | POA: Diagnosis not present

## 2018-02-01 DIAGNOSIS — I5032 Chronic diastolic (congestive) heart failure: Secondary | ICD-10-CM | POA: Diagnosis not present

## 2018-02-01 DIAGNOSIS — I2510 Atherosclerotic heart disease of native coronary artery without angina pectoris: Secondary | ICD-10-CM | POA: Diagnosis not present

## 2018-02-01 DIAGNOSIS — E11649 Type 2 diabetes mellitus with hypoglycemia without coma: Secondary | ICD-10-CM | POA: Diagnosis not present

## 2018-02-01 DIAGNOSIS — A401 Sepsis due to streptococcus, group B: Secondary | ICD-10-CM | POA: Diagnosis not present

## 2018-02-01 DIAGNOSIS — I130 Hypertensive heart and chronic kidney disease with heart failure and stage 1 through stage 4 chronic kidney disease, or unspecified chronic kidney disease: Secondary | ICD-10-CM | POA: Diagnosis not present

## 2018-02-01 DIAGNOSIS — J449 Chronic obstructive pulmonary disease, unspecified: Secondary | ICD-10-CM | POA: Diagnosis not present

## 2018-02-04 ENCOUNTER — Telehealth: Payer: Self-pay | Admitting: Behavioral Health

## 2018-02-04 ENCOUNTER — Ambulatory Visit: Payer: Medicare HMO | Admitting: Internal Medicine

## 2018-02-04 ENCOUNTER — Encounter: Payer: Self-pay | Admitting: Internal Medicine

## 2018-02-04 VITALS — BP 115/71 | HR 70 | Temp 97.8°F | Wt 321.0 lb

## 2018-02-04 DIAGNOSIS — R7881 Bacteremia: Secondary | ICD-10-CM | POA: Diagnosis not present

## 2018-02-04 DIAGNOSIS — Z5181 Encounter for therapeutic drug level monitoring: Secondary | ICD-10-CM | POA: Diagnosis not present

## 2018-02-04 DIAGNOSIS — Z95828 Presence of other vascular implants and grafts: Secondary | ICD-10-CM | POA: Insufficient documentation

## 2018-02-04 NOTE — Telephone Encounter (Addendum)
RN called Herminio Commons at [facility] to inform her that Mr. Sherrow had his PICC line removed in the office today.  Mary verbalized understanding.   Pricilla Riffle RN

## 2018-02-04 NOTE — Assessment & Plan Note (Signed)
Hopefully resolved.  Now off treatment over 1 week.  I will check 2 peripheral blood cultures today and monitor.  If negattive, no follow up indicated.

## 2018-02-04 NOTE — Assessment & Plan Note (Signed)
picc line removed today

## 2018-02-04 NOTE — Progress Notes (Signed)
Per verbal order from Dr Linus Salmons, 48 cm Single Lumen Peripherally Inserted Central Catheter removed from right basilic, tip intact. No sutures present. RN confirmed length per chart. Dressing was clean and dry. Petroleum dressing applied. Pt advised no heavy lifting with this arm, leave dressing for 24 hours and call the office or seek emergent care if dressing becomes soaked with blood or sharp pain presents. Patient verbalized understanding and agreement.  Patient's questions answered to their satisfaction. Patient tolerated procedure well. Landis Gandy, RN

## 2018-02-04 NOTE — Progress Notes (Signed)
   Subjective:    Patient ID: [name], male    DOB: November 01, 1941, 77 y.o.   MRN: [id number]  HPI Here for hospital follow up.  He presented with cough and sob with fever and had blood cultures done, and 2/2 positive for GBS.  He has a known PM since 2011 due to SSS and heart block and due to size and inability to lie flat, no TTE or TEE was able to be performed.  He now has completed 4 weeks of IV ceftriaxone from negative blood cultures and is having no issues.  No sob, no fever, no rash, no diarrhea, no chills.  Had no issues with picc line.    Review of Systems  Constitutional: Negative for fatigue and fever.  Gastrointestinal: Negative for diarrhea.  Skin: Negative for rash.  Neurological: Negative for dizziness.       Objective:   Physical Exam  Constitutional: He appears well-developed and well-nourished. No distress.  HENT:  Mouth/Throat: No oropharyngeal exudate.  Eyes: No scleral icterus.  Cardiovascular: Normal rate, regular rhythm and normal heart sounds.  No murmur heard. No tenderness, no warmth or erythema at site of PM  Pulmonary/Chest: Effort normal and breath sounds normal. No respiratory distress.   SH: former smoker       Assessment & Plan:

## 2018-02-04 NOTE — Assessment & Plan Note (Signed)
Stable WBC

## 2018-02-10 ENCOUNTER — Encounter: Payer: Self-pay | Admitting: Nurse Practitioner

## 2018-02-10 ENCOUNTER — Ambulatory Visit: Payer: Medicare HMO | Admitting: Endocrinology

## 2018-02-10 LAB — CULTURE, BLOOD (SINGLE)
MICRO NUMBER: 90262196
MICRO NUMBER:: 90262201
RESULT: NO GROWTH
Result:: NO GROWTH
SPECIMEN QUALITY: ADEQUATE
SPECIMEN QUALITY:: ADEQUATE

## 2018-02-12 ENCOUNTER — Encounter (HOSPITAL_BASED_OUTPATIENT_CLINIC_OR_DEPARTMENT_OTHER): Payer: Medicare HMO | Attending: Internal Medicine

## 2018-02-12 DIAGNOSIS — E1122 Type 2 diabetes mellitus with diabetic chronic kidney disease: Secondary | ICD-10-CM | POA: Diagnosis not present

## 2018-02-12 DIAGNOSIS — L97811 Non-pressure chronic ulcer of other part of right lower leg limited to breakdown of skin: Secondary | ICD-10-CM | POA: Diagnosis not present

## 2018-02-12 DIAGNOSIS — I132 Hypertensive heart and chronic kidney disease with heart failure and with stage 5 chronic kidney disease, or end stage renal disease: Secondary | ICD-10-CM | POA: Insufficient documentation

## 2018-02-12 DIAGNOSIS — E1151 Type 2 diabetes mellitus with diabetic peripheral angiopathy without gangrene: Secondary | ICD-10-CM | POA: Insufficient documentation

## 2018-02-12 DIAGNOSIS — I4891 Unspecified atrial fibrillation: Secondary | ICD-10-CM | POA: Diagnosis not present

## 2018-02-12 DIAGNOSIS — J449 Chronic obstructive pulmonary disease, unspecified: Secondary | ICD-10-CM | POA: Insufficient documentation

## 2018-02-12 DIAGNOSIS — I890 Lymphedema, not elsewhere classified: Secondary | ICD-10-CM | POA: Insufficient documentation

## 2018-02-12 DIAGNOSIS — E11622 Type 2 diabetes mellitus with other skin ulcer: Secondary | ICD-10-CM | POA: Insufficient documentation

## 2018-02-12 DIAGNOSIS — G4730 Sleep apnea, unspecified: Secondary | ICD-10-CM | POA: Insufficient documentation

## 2018-02-12 DIAGNOSIS — I5040 Unspecified combined systolic (congestive) and diastolic (congestive) heart failure: Secondary | ICD-10-CM | POA: Diagnosis not present

## 2018-02-12 DIAGNOSIS — N186 End stage renal disease: Secondary | ICD-10-CM | POA: Diagnosis not present

## 2018-02-12 DIAGNOSIS — E1140 Type 2 diabetes mellitus with diabetic neuropathy, unspecified: Secondary | ICD-10-CM | POA: Insufficient documentation

## 2018-02-12 DIAGNOSIS — Z86718 Personal history of other venous thrombosis and embolism: Secondary | ICD-10-CM | POA: Insufficient documentation

## 2018-02-12 DIAGNOSIS — Z8551 Personal history of malignant neoplasm of bladder: Secondary | ICD-10-CM | POA: Diagnosis not present

## 2018-02-12 DIAGNOSIS — Z87891 Personal history of nicotine dependence: Secondary | ICD-10-CM | POA: Diagnosis not present

## 2018-02-12 DIAGNOSIS — L97812 Non-pressure chronic ulcer of other part of right lower leg with fat layer exposed: Secondary | ICD-10-CM | POA: Diagnosis not present

## 2018-02-12 DIAGNOSIS — I87331 Chronic venous hypertension (idiopathic) with ulcer and inflammation of right lower extremity: Secondary | ICD-10-CM | POA: Insufficient documentation

## 2018-02-17 ENCOUNTER — Encounter: Payer: Self-pay | Admitting: Nurse Practitioner

## 2018-02-17 ENCOUNTER — Ambulatory Visit: Payer: Medicare HMO | Admitting: Nurse Practitioner

## 2018-02-17 VITALS — BP 130/76 | HR 74 | Ht 70.0 in | Wt 319.1 lb

## 2018-02-17 DIAGNOSIS — I5042 Chronic combined systolic (congestive) and diastolic (congestive) heart failure: Secondary | ICD-10-CM

## 2018-02-17 DIAGNOSIS — I480 Paroxysmal atrial fibrillation: Secondary | ICD-10-CM

## 2018-02-17 NOTE — Patient Instructions (Addendum)
We will be checking the following labs today - BMET, CBC, HPF and TSH   Medication Instructions:    Continue with your current medicines.     Testing/Procedures To Be Arranged:  N/A  Follow-Up:   See Dr. Rayann Heman in May as planned.     Other Special Instructions:   N/A    If you need a refill on your cardiac medications before your next appointment, please call your pharmacy.   Call the [place] office at [phone] if you have any questions, problems or concerns.

## 2018-02-17 NOTE — Progress Notes (Signed)
CARDIOLOGY OFFICE NOTE  Date:  02/17/2018    [name] Date of Birth: [birth year] Medical Record #[id number]  PCP:  Renato Shin, MD  Cardiologist:  Servando Snare [facility]    Chief Complaint  Patient presents with  . Congestive Heart Failure  . Shortness of Breath  . Follow-up    Follow up visit - seen for Dr. Rayann Heman    History of Present Illness: [name] is a 77 y.o. male who presents today for a follow up visit. Seen for Dr. Rayann Heman.   He has a history of diastolic HF, morbid obesity, PAF with past cardioversion, venous insufficiency, CKD, anemia, hypothyroidism, DM, COPD, OSA on CPAP, HLD, SSS with PPM in place, CAD (no specifics noted), HTN and depression. On chronic anticoagulation with Eliquis.   He has had several cardioversions - last in Julyof 2016. He is now on amiodarone. Dr. Rayann Heman and I both have felt like he is not really any more symptomatic from his baseline regardless of his rhythm but [place] has preferred to be in NSR.  Our planhas beenthat If he remains in sinus, will continue on amiodarone 200mg  daily. IF he does not convert or does not have significant clinical improve with cardioversion, it may be better to stop amiodarone and use a rate control strategy long term.  Admitted back in August with respiratory failure/COPD exacerbation. Treated with antibiotics, prednisone, nebs and oxygen. Echo updated - EF down now to 45 to 50% - was 55% in 2015. Appeared to be euvolemic. Troponins were flat/mildly elevated - felt to be due to respiratory failure in the setting of CKD.  I then saw him in November of 2018 - remained short of breath. Lots of cough. Tenuous situation. Not on optimal therapy and to try and put him on a better CHF regimen would need more frequent labs and transportation is such a big issue for him.   Got admitted again in January - sepsis with strep bacteremia - unclear if due to ulcerations on his legs or from his device - he was on  a prolonged course of antibiotics. Was not able to tolerate echo. TEE recommended but high risk for decompensation. Prolonged course of antibiotics given and he was to follow up with ID.   I saw him after that admission. Was to get antibiotics for 4 weeks. Had follow up with ID. Tried to simplify his medicines. He admitted he would forget his mid day dose of Metoprolol so we changed to just BID.   Comes back today. Here alone.He has had his PICC removed. Had negative blood cultures one week after his completion of antibiotic therapy. He has been referred to [facility] due to the leg ulcers and concern for poor circulation. He seems to be holding his own. No chest pain. Breathing is stable as long as he is at rest. No falls. Tolerating his medicines.   Past Medical History:  Diagnosis Date  . Anemia    hx of  . Anxiety   . CAD (coronary artery disease)   . Cancer [facility]) 2014   bladder cancer AND RIGHT URETERAL CANCER  . COPD (chronic obstructive pulmonary disease) (HCC)    CPAP  . Depression   . Diabetes mellitus    TypeII  . Diastolic dysfunction with chronic heart failure ([facility])   . Gait difficulty    slow gait"ambulates with walker"  . GERD (gastroesophageal reflux disease)   . 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 H/O Legionnaire's disease 2003  . History of blood clots    R groin  . History of kidney stones   . Hyperkalemia   . Hyperlipemia   . Hypertensive cardiovascular-renal disease   . Hypothyroidism   . Morbid obesity ([place])   . OSA on CPAP    using CPAP although it is hard for him to tolerate  . Osteoarthritis    fingers  . Pancreatitis   . Peripheral neuropathy   . Persistent atrial fibrillation ([place])   . Pneumonia 2003  . Renal insufficiency   . Shortness of breath dyspnea    with exertion  . Sick sinus syndrome ([place]) 06/2010   s/p PPM by [employer]  . Venous insufficiency     Past Surgical History:  Procedure Laterality Date  . BELPHAROPTOSIS REPAIR  Right    Glaucoma  . CARDIAC CATHETERIZATION     11-02-13  . CARDIOVERSION N/A 05/16/2014   Procedure: CARDIOVERSION;  Surgeon: Josue Hector, MD;  Location: [facility] ENDOSCOPY;  Service: Cardiovascular;  Laterality: N/A;  . CARDIOVERSION N/A 11/08/2014   Procedure: CARDIOVERSION;  Surgeon: Fay Records, MD;  Location: [place];  Service: Cardiovascular;  Laterality: N/A;  . CARDIOVERSION N/A 06/22/2015   Procedure: CARDIOVERSION;  Surgeon: Jerline Pain, MD;  Location: [place];  Service: Cardiovascular;  Laterality: N/A;  . CHOLECYSTECTOMY  2012  . CYSTOSCOPY W/ URETERAL STENT PLACEMENT Right 10/26/2013   Procedure: CYSTOSCOPY WITH RETROGRADE PYELOGRAM/URETERAL STENT PLACEMENT;  Surgeon: Alexis Frock, MD;  Location: [facility];  Service: Urology;  Laterality: Right;  . CYSTOSCOPY WITH URETEROSCOPY AND STENT PLACEMENT Right 01/11/2014   Procedure: CYSTOSCOPY WITH URETEROSCOPY ,RIGHT RETROGRADE AND STENT CHANGE AND LASER OF URETERAL TUMOR;  Surgeon: Alexis Frock, MD;  Location: [facility];  Service: Urology;  Laterality: Right;  . CYSTOSCOPY/RETROGRADE/URETEROSCOPY Right 08/23/2014   Procedure: CYSTOSCOPY/RETROGRADE/ DIAGNOSTIC URETEROSCOPY/RIGHT RENAL STONE EXTRACTION;  Surgeon: Alexis Frock, MD;  Location: [facility];  Service: Urology;  Laterality: Right;  . EMBOLECTOMY Left 11/02/2013   Procedure: LEFT FEMORAL EMBOLECTOMY, LEFT FEMORAL ARTERY ENDARTERECTOMY WITH DACRON PATCH ANGIOPLASTY.;  Surgeon: Mal Misty, MD;  Location: [place];  Service: Vascular;  Laterality: Left;  . EYE SURGERY Left    cataract  . GROIN DEBRIDEMENT Right 05/08/2015   Procedure: REMOVAL OF RIGHT GROIN MASS;  Surgeon: Angelia Mould, MD;  Location: [place];  Service: Vascular;  Laterality: Right;  . HOLMIUM LASER APPLICATION Right 03/08/2877   Procedure: HOLMIUM LASER APPLICATION;  Surgeon: Alexis Frock, MD;  Location: [facility];  Service: Urology;  Laterality: Right;  . INSERT / REPLACE / REMOVE PACEMAKER  2011  .  NASAL SEPTUM SURGERY  1967  . PACEMAKER PLACEMENT  06/2010   [facility] Acc[employer] RF DR, Model [id number] ( Serial number [id number])  . TEE WITHOUT CARDIOVERSION N/A 05/16/2014   Procedure: TRANSESOPHAGEAL ECHOCARDIOGRAM (TEE);  Surgeon: Josue Hector, MD;  Location: [facility] ENDOSCOPY;  Service: Cardiovascular;  Laterality: N/A;  . thromboembolectomy and four compartment fasciotomy Right 2009   leg  . TRANSURETHRAL RESECTION OF BLADDER TUMOR WITH GYRUS (TURBT-GYRUS) N/A 10/26/2013   Procedure: TRANSURETHRAL RESECTION OF BLADDER TUMOR WITH GYRUS (TURBT-GYRUS);  Surgeon: Alexis Frock, MD;  [facility];  Service: Urology;  Laterality: N/A;  . TRANSURETHRAL RESECTION OF BLADDER TUMOR WITH GYRUS (TURBT-GYRUS) N/A 01/11/2014   Procedure: TRANSURETHRAL RESECTION OF BLADDER TUMOR WITH GYRUS (TURBT-GYRUS);  Surgeon: Alexis Frock, MD;  [facility];  Service: Urology;  Laterality: N/A;  .  WISDOM TOOTH EXTRACTION       Medications: Current Meds  Medication Sig  . acetaminophen (TYLENOL) 500 MG tablet Take 1,000 mg by mouth 2 (two) times daily as needed (pain/headache).   Marland Kitchen albuterol (VENTOLIN HFA) 108 (90 Base) MCG/ACT inhaler Inhale 2 puffs into the lungs every 6 (six) hours as needed for wheezing or shortness of breath.  Marland Kitchen amiodarone (PACERONE) 200 MG tablet TAKE 1 TABLET DAILY (Patient taking differently: TAKE 1 TABLET (200 MG) BY MOUTH DAILY)  . amiodarone (PACERONE) 200 MG tablet Take 1 tablet (200 mg total) by mouth daily.  Marland Kitchen apixaban (ELIQUIS) 5 MG TABS tablet Take 1 tablet (5 mg total) by mouth 2 (two) times daily.  . Ascorbic Acid (VITAMIN C) 500 MG tablet Take 500 mg by mouth daily.   . brimonidine (ALPHAGAN P) 0.1 % SOLN Place 1 drop into the left eye 3 (three) times daily.   . budesonide-formoterol (SYMBICORT) 80-4.5 MCG/ACT inhaler Inhale 2 puffs into the lungs 2 (two) times daily.  . budesonide-formoterol (SYMBICORT) 80-4.5 MCG/ACT inhaler Inhale into the lungs 2 (two) times daily.  .  calcium carbonate (TUMS - DOSED IN MG ELEMENTAL CALCIUM) 500 MG chewable tablet Chew 2 tablets by mouth 2 (two) times daily as needed for indigestion or heartburn.  . cholecalciferol (VITAMIN D) 1000 UNITS tablet Take 1,000 Units by mouth 2 (two) times daily.   Marland Kitchen CINNAMON PO Take 2,000 mg by mouth daily.  Marland Kitchen dextromethorphan (DELSYM) 30 MG/5ML liquid Take 90 mg by mouth 2 (two) times daily as needed for cough.  . Digestive Enzymes (MULTI-ENZYME) TABS Take 1 tablet by mouth daily.  Marland Kitchen docusate sodium (COLACE) 100 MG capsule Take 100-200 mg by mouth daily as needed for mild constipation.   . dorzolamide (TRUSOPT) 2 % ophthalmic solution Place 1 drop into both eyes 2 (two) times daily.  . fluticasone (FLONASE) 50 MCG/ACT nasal spray Place 2 sprays into both nostrils daily.  . fluticasone (FLONASE) 50 MCG/ACT nasal spray USE 2 SPRAYS IN EACN NOSTRIL DAILY  . furosemide (LASIX) 40 MG tablet Take 1 tablet (40 mg total) by mouth 2 (two) times daily.  Marland Kitchen guaiFENesin (MUCINEX) 600 MG 12 hr tablet Take 600 mg by mouth 2 (two) times daily as needed for cough or to loosen phlegm.  . insulin regular (NOVOLIN R) 100 units/mL injection 4 times a day (just before each meal), 300 units with breakfast, 210 units with lunch, 220 units  at supper, and 190 units with bedtime snack.  . Lactobacillus (ACIDOPHILUS) CAPS capsule Take 1 capsule by mouth every evening.   Marland Kitchen levothyroxine (SYNTHROID, LEVOTHROID) 75 MCG tablet Take 1 tablet (75 mcg total) by mouth daily before breakfast.  . metoprolol tartrate (LOPRESSOR) 25 MG tablet Take 1 tablet (25 mg total) by mouth 2 (two) times daily.  . Multiple Vitamin (MULTIVITAMIN WITH MINERALS) TABS tablet Take 1 tablet by mouth every evening.   Mckinley Jewel Dimesylate (RHOPRESSA) 0.02 % SOLN Place 1 drop into the left eye daily.  . Omega-3 Fatty Acids (FISH OIL) 1000 MG CAPS Take 1,000 mg by mouth daily.   Marland Kitchen omeprazole (PRILOSEC) 40 MG capsule Take 1 capsule (40 mg total) by mouth  daily.  Marland Kitchen Resveratrol 100 MG CAPS Take 100 mg by mouth every evening.  . rosuvastatin (CRESTOR) 40 MG tablet Take 0.5 tablets (20 mg total) by mouth every evening.  . Timolol Maleate 0.5 % (DAILY) SOLN Place 1 drop into both eyes 2 (two) times daily.  . traMADol (  ULTRAM) 50 MG tablet Take 100 mg by mouth 2 (two) times daily. As needed for pain  . travoprost, benzalkonium, (TRAVATAN) 0.004 % ophthalmic solution Place 1 drop into the left eye at bedtime.     Allergies: Allergies  Allergen Reactions  . Actos [Pioglitazone] Swelling  . Timolol Other (See Comments)    Slow heart rate but tolerates Cosopt (dorzolamide-timolol)    Social History: The patient  reports that he quit smoking about 11 years ago. His smoking use included cigarettes. He has a 100.00 pack-year smoking history. he has never used smokeless tobacco. He reports that he does not drink alcohol or use drugs.   Family History: The patient's family history includes Cancer in his mother; Heart attack in his father; Liver cancer in his mother.   Review of Systems: Please see the history of present illness.   Otherwise, the review of systems is positive for none.   All other systems are reviewed and negative.   Physical Exam: VS:  BP 130/76   Pulse 74   Ht 5\' 10"  (1.778 m)   Wt (!) 319 lb 1.9 oz (144.8 kg)   SpO2 98%   BMI 45.79 kg/m  .  BMI Body mass index is 45.79 kg/m.  Wt Readings from Last 3 Encounters:  02/17/18 (!) 319 lb 1.9 oz (144.8 kg)  02/04/18 (!) 321 lb (145.6 kg)  01/27/18 (!) 316 lb (143.3 kg)    General: Pleasant. Morbidly obese. Chronically ill appearing. Alert and in no acute distress.   HEENT: Normal.  Neck: Supple, no JVD, carotid bruits, or masses noted.  Cardiac: Regular rate and rhythm. No murmurs, rubs, or gallops. No edema.  Respiratory:  Lungs are clear to auscultation bilaterally with normal work of breathing.  GI: Extremely large pannus MS: No deformity or atrophy. Gait and ROM  intact but he moves slow with a walker.   Skin: Warm and dry. Color is normal.  Neuro:  Strength and sensation are intact and no gross focal deficits noted.  Psych: Alert, appropriate and with normal affect.   LABORATORY DATA:  EKG:  EKG is not ordered today.  Lab Results  Component Value Date   WBC 9.1 01/02/2018   HGB 10.0 (L) 01/02/2018   HCT 33.1 (L) 01/02/2018   PLT 273 01/02/2018   GLUCOSE 225 (H) 01/02/2018   CHOL 133 05/06/2017   TRIG 396.0 (H) 05/06/2017   HDL 39.70 05/06/2017   LDLDIRECT 59.0 05/06/2017   LDLCALC  05/07/2008    UNABLE TO CALCULATE IF TRIGLYCERIDE OVER 400 mg/dL        Total Cholesterol/HDL:CHD Risk Coronary Heart Disease Risk Table                     Men   Women  1/2 Average Risk   3.4   3.3   ALT 29 12/30/2017   AST 48 (H) 12/30/2017   NA 134 (L) 01/02/2018   K 5.1 01/02/2018   CL 102 01/02/2018   CREATININE 1.39 (H) 01/02/2018   BUN 24 (H) 01/02/2018   CO2 22 01/02/2018   TSH 3.010 10/26/2017   PSA 0.73 05/06/2017   INR 1.33 07/28/2017   HGBA1C 7.6 01/27/2018   MICROALBUR 1.6 11/11/2017     BNP (last 3 results) Recent Labs    07/28/17 1530 12/28/17 1751  BNP 173.7* 767.4*    ProBNP (last 3 results) Recent Labs    10/26/17 1425  PROBNP 1,148*     Other Studies  Reviewed Today:  EchoStudy ConclusionsAugust 2018  - Left ventricle: Abnromal septal motion apical and inferior hypokinesis poor image quality. The cavity size was normal. Wall thickness was increased in a pattern of mild LVH. Systolic function was mildly reduced. The estimated ejection fraction was in the range of 45% to 50%. Left ventricular diastolic function parameters were normal. - Aortic valve: There was moderate stenosis. Valve area (VTI): 0.92 cm^2. Valve area (Vmax): 0.93 cm^2. Valve area (Vmean): 0.83 cm^2. - Left atrium: The atrium was moderately dilated. - Right ventricle: Pacer wire or catheter noted in right ventricle. - Atrial  septum: No defect or patent foramen ovale was identified.  Impressions:  - There are not enough images available for an interpretation. Need to complete echo or will need to send all images to system, not sure which is the problem.  Assessment/Plan:  1. Recent bout of sepsis/strep bacteremia - he has completed his antibiotics - had negative repeat cultures.   2. Chronic combined chronic systolic and diastolic HF- not on optimal therapy - not a lot of BP to work with - he has issues with transportation - very sad situation overall - we have opted for more of a conservative approach.   3. PAF- on amiodarone - has PPM in place - surveillance labs today.   4. High risk medicine  5. OSA- followed by pulmonary. Says he is wearing his CPAP.  6. Morbid obesity- this remains a big part of his issues - sadly, I do not see this changing.  7. HTN- BP ok on current regimen.   8. Moderate AS - no cardinal symptoms  Current medicines are reviewed with the patient today.  The patient does not have concerns regarding medicines other than what has been noted above.  The following changes have been made:  See above.  Labs/ tests ordered today include:    Orders Placed This Encounter  Procedures  . Basic metabolic panel  . CBC  . Hepatic function panel  . TSH     Disposition:   FU with Dr. Rayann Heman in May as planned. I will be happy to see back as needed. Overall situation tenuous at best.    Patient is agreeable to this plan and will call if any problems develop in the interim.   SignedTruitt Merle, NP  02/17/2018 3:04 PM  [facility] [address] [place], [place]  [address] Phone: [phone] Fax: [phone]

## 2018-02-18 LAB — HEPATIC FUNCTION PANEL
ALT: 15 IU/L (ref 0–44)
AST: 21 IU/L (ref 0–40)
Albumin: 3.8 g/dL (ref 3.5–4.8)
Alkaline Phosphatase: 85 IU/L (ref 39–117)
Bilirubin Total: 0.2 mg/dL (ref 0.0–1.2)
Bilirubin, Direct: 0.11 mg/dL (ref 0.00–0.40)
Total Protein: 7 g/dL (ref 6.0–8.5)

## 2018-02-18 LAB — BASIC METABOLIC PANEL
BUN/Creatinine Ratio: 17 (ref 10–24)
BUN: 35 mg/dL — ABNORMAL HIGH (ref 8–27)
CO2: 23 mmol/L (ref 20–29)
Calcium: 8.9 mg/dL (ref 8.6–10.2)
Chloride: 100 mmol/L (ref 96–106)
Creatinine, Ser: 2.02 mg/dL — ABNORMAL HIGH (ref 0.76–1.27)
GFR calc Af Amer: 36 mL/min/{1.73_m2} — ABNORMAL LOW (ref >59)
GFR calc non Af Amer: 32 mL/min/{1.73_m2} — ABNORMAL LOW (ref >59)
Glucose: 189 mg/dL — ABNORMAL HIGH (ref 65–99)
Potassium: 4 mmol/L (ref 3.5–5.2)
Sodium: 141 mmol/L (ref 134–144)

## 2018-02-18 LAB — CBC
Hematocrit: 32 % — ABNORMAL LOW (ref 37.5–51.0)
Hemoglobin: 9.4 g/dL — ABNORMAL LOW (ref 13.0–17.7)
MCH: 24.7 pg — ABNORMAL LOW (ref 26.6–33.0)
MCHC: 29.4 g/dL — ABNORMAL LOW (ref 31.5–35.7)
MCV: 84 fL (ref 79–97)
Platelets: 245 10*3/uL (ref 150–379)
RBC: 3.81 x10E6/uL — ABNORMAL LOW (ref 4.14–5.80)
RDW: 17.8 % — ABNORMAL HIGH (ref 12.3–15.4)
WBC: 8.7 10*3/uL (ref 3.4–10.8)

## 2018-02-18 LAB — TSH: TSH: 2.57 u[IU]/mL (ref 0.450–4.500)

## 2018-02-22 DIAGNOSIS — I132 Hypertensive heart and chronic kidney disease with heart failure and with stage 5 chronic kidney disease, or end stage renal disease: Secondary | ICD-10-CM | POA: Diagnosis not present

## 2018-02-22 DIAGNOSIS — I87331 Chronic venous hypertension (idiopathic) with ulcer and inflammation of right lower extremity: Secondary | ICD-10-CM | POA: Diagnosis not present

## 2018-02-22 DIAGNOSIS — I5040 Unspecified combined systolic (congestive) and diastolic (congestive) heart failure: Secondary | ICD-10-CM | POA: Diagnosis not present

## 2018-02-22 DIAGNOSIS — L97219 Non-pressure chronic ulcer of right calf with unspecified severity: Secondary | ICD-10-CM | POA: Diagnosis not present

## 2018-02-22 DIAGNOSIS — L97811 Non-pressure chronic ulcer of other part of right lower leg limited to breakdown of skin: Secondary | ICD-10-CM | POA: Diagnosis not present

## 2018-02-22 DIAGNOSIS — J449 Chronic obstructive pulmonary disease, unspecified: Secondary | ICD-10-CM | POA: Diagnosis not present

## 2018-02-22 DIAGNOSIS — I890 Lymphedema, not elsewhere classified: Secondary | ICD-10-CM | POA: Diagnosis not present

## 2018-02-22 DIAGNOSIS — G4730 Sleep apnea, unspecified: Secondary | ICD-10-CM | POA: Diagnosis not present

## 2018-02-22 DIAGNOSIS — E1151 Type 2 diabetes mellitus with diabetic peripheral angiopathy without gangrene: Secondary | ICD-10-CM | POA: Diagnosis not present

## 2018-02-22 DIAGNOSIS — E11622 Type 2 diabetes mellitus with other skin ulcer: Secondary | ICD-10-CM | POA: Diagnosis not present

## 2018-02-24 ENCOUNTER — Ambulatory Visit: Payer: Medicare HMO | Admitting: Endocrinology

## 2018-02-24 ENCOUNTER — Other Ambulatory Visit: Payer: Self-pay | Admitting: Internal Medicine

## 2018-02-24 DIAGNOSIS — L97919 Non-pressure chronic ulcer of unspecified part of right lower leg with unspecified severity: Secondary | ICD-10-CM

## 2018-02-24 DIAGNOSIS — L97929 Non-pressure chronic ulcer of unspecified part of left lower leg with unspecified severity: Principal | ICD-10-CM

## 2018-02-24 DIAGNOSIS — R600 Localized edema: Secondary | ICD-10-CM

## 2018-02-26 ENCOUNTER — Encounter (HOSPITAL_COMMUNITY): Payer: Medicare HMO

## 2018-03-05 ENCOUNTER — Other Ambulatory Visit: Payer: Self-pay | Admitting: Endocrinology

## 2018-03-10 ENCOUNTER — Encounter: Payer: Self-pay | Admitting: Endocrinology

## 2018-03-10 ENCOUNTER — Ambulatory Visit (INDEPENDENT_AMBULATORY_CARE_PROVIDER_SITE_OTHER): Payer: Medicare HMO | Admitting: Endocrinology

## 2018-03-10 VITALS — BP 120/90 | HR 75 | Temp 97.8°F | Ht 70.0 in | Wt 326.2 lb

## 2018-03-10 DIAGNOSIS — E1122 Type 2 diabetes mellitus with diabetic chronic kidney disease: Secondary | ICD-10-CM

## 2018-03-10 DIAGNOSIS — D500 Iron deficiency anemia secondary to blood loss (chronic): Secondary | ICD-10-CM

## 2018-03-10 DIAGNOSIS — R0600 Dyspnea, unspecified: Secondary | ICD-10-CM | POA: Diagnosis not present

## 2018-03-10 DIAGNOSIS — N183 Chronic kidney disease, stage 3 (moderate): Secondary | ICD-10-CM

## 2018-03-10 DIAGNOSIS — Z794 Long term (current) use of insulin: Secondary | ICD-10-CM

## 2018-03-10 DIAGNOSIS — E1169 Type 2 diabetes mellitus with other specified complication: Secondary | ICD-10-CM

## 2018-03-10 DIAGNOSIS — E7800 Pure hypercholesterolemia, unspecified: Secondary | ICD-10-CM | POA: Diagnosis not present

## 2018-03-10 DIAGNOSIS — E669 Obesity, unspecified: Secondary | ICD-10-CM | POA: Diagnosis not present

## 2018-03-10 LAB — POCT GLYCOSYLATED HEMOGLOBIN (HGB A1C): HEMOGLOBIN A1C: 6.8

## 2018-03-10 MED ORDER — INSULIN REGULAR HUMAN 100 UNIT/ML IJ SOLN: INTRAMUSCULAR | 3 refills | Status: DC

## 2018-03-10 NOTE — Patient Instructions (Addendum)
Please reduce the supper insulin to 210 units. check your blood sugar 4 times a day: before the 3 meals, and at bedtime.  also check if you have symptoms of your blood sugar being too high or too low.  please keep a record of the readings and bring it to your next appointment here (or you can bring the meter itself).  You can write it on any piece of paper.  please call us sooner if your blood sugar goes below 70, or if you have a lot of readings over 200. Please come back for a follow-up appointment in 3 months.

## 2018-03-10 NOTE — Progress Notes (Signed)
Subjective:    Patient ID: [name], male    DOB: 1941/10/02, 77 y.o.   MRN: [id number]  HPI Pt returns for f/u of diabetes mellitus:  DM type: Insulin-requiring type 2.  Dx'ed: 1988.  Complications: renal insufficiency, polyneuropathy, and CAD.   Therapy: insulin since 1989.  DKA: never.   Severe hypoglycemia: never.   Pancreatitis: several episodes, most recently in 2013.   Other: he takes multiple daily injections.  He takes U-100 reg, rather than U-500, due to cost He brings a record of his cbg's which I have reviewed today.  It varies from 43-302.  It is still in general lowest at HS.  pt states he feels well in general.  Past Medical History:  Diagnosis Date  . Anemia    hx of  . Anxiety   . CAD (coronary artery disease)   . Cancer [facility]) 2014   bladder cancer AND RIGHT URETERAL CANCER  . COPD (chronic obstructive pulmonary disease) (HCC)    CPAP  . Depression   . Diabetes mellitus    TypeII  . Diastolic dysfunction with chronic heart failure ([place])   . Gait difficulty    slow gait"ambulates with walker"  . GERD (gastroesophageal reflux disease)   . 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 H/O Legionnaire's disease 2003  . History of blood clots    R groin  . History of kidney stones   . Hyperkalemia   . Hyperlipemia   . Hypertensive cardiovascular-renal disease   . Hypothyroidism   . Morbid obesity ([place])   . OSA on CPAP    using CPAP although it is hard for him to tolerate  . Osteoarthritis    fingers  . Pancreatitis   . Peripheral neuropathy   . Persistent atrial fibrillation ([place])   . Pneumonia 2003  . Renal insufficiency   . Shortness of breath dyspnea    with exertion  . Sick sinus syndrome ([place]) 06/2010   s/p PPM by [employer] Jude  . Venous insufficiency     Past Surgical History:  Procedure Laterality Date  . BELPHAROPTOSIS REPAIR Right    Glaucoma  . CARDIAC CATHETERIZATION     11-02-13  . CARDIOVERSION N/A 05/16/2014   Procedure:  CARDIOVERSION;  Surgeon: Josue Hector, MD;  Location: [facility] ENDOSCOPY;  Service: Cardiovascular;  Laterality: N/A;  . CARDIOVERSION N/A 11/08/2014   Procedure: CARDIOVERSION;  Surgeon: Fay Records, MD;  Location: [place];  Service: Cardiovascular;  Laterality: N/A;  . CARDIOVERSION N/A 06/22/2015   Procedure: CARDIOVERSION;  Surgeon: Jerline Pain, MD;  Location: [place];  Service: Cardiovascular;  Laterality: N/A;  . CHOLECYSTECTOMY  2012  . CYSTOSCOPY W/ URETERAL STENT PLACEMENT Right 10/26/2013   Procedure: CYSTOSCOPY WITH RETROGRADE PYELOGRAM/URETERAL STENT PLACEMENT;  Surgeon: Alexis Frock, MD;  [facility];  Service: Urology;  Laterality: Right;  . CYSTOSCOPY WITH URETEROSCOPY AND STENT PLACEMENT Right 01/11/2014   Procedure: CYSTOSCOPY WITH URETEROSCOPY ,RIGHT RETROGRADE AND STENT CHANGE AND LASER OF URETERAL TUMOR;  Surgeon: Alexis Frock, MD;  [facility];  Service: Urology;  Laterality: Right;  . CYSTOSCOPY/RETROGRADE/URETEROSCOPY Right 08/23/2014   Procedure: CYSTOSCOPY/RETROGRADE/ DIAGNOSTIC URETEROSCOPY/RIGHT RENAL STONE EXTRACTION;  Surgeon: Alexis Frock, MD;  [facility];  Service: Urology;  Laterality: Right;  . EMBOLECTOMY Left 11/02/2013   Procedure: LEFT FEMORAL EMBOLECTOMY, LEFT FEMORAL ARTERY ENDARTERECTOMY WITH DACRON PATCH ANGIOPLASTY.;  Surgeon: Mal Misty, MD;  Location: [place];  Service: Vascular;  Laterality: Left;  . EYE SURGERY Left    cataract  . GROIN DEBRIDEMENT Right 05/08/2015   Procedure: REMOVAL OF RIGHT GROIN MASS;  Surgeon: Angelia Mould, MD;  Location: [place];  Service: Vascular;  Laterality: Right;  . HOLMIUM LASER APPLICATION Right 01/14/9484   Procedure: HOLMIUM LASER APPLICATION;  Surgeon: Alexis Frock, MD;  [facility];  Service: Urology;  Laterality: Right;  . INSERT / REPLACE / REMOVE PACEMAKER  2011  . NASAL SEPTUM SURGERY  1967  . PACEMAKER PLACEMENT  06/2010   [facility] Accent RF DR, Model [id number] (  Serial number [id number])  . TEE WITHOUT CARDIOVERSION N/A 05/16/2014   Procedure: TRANSESOPHAGEAL ECHOCARDIOGRAM (TEE);  Surgeon: Josue Hector, MD;  Location: [facility] ENDOSCOPY;  Service: Cardiovascular;  Laterality: N/A;  . thromboembolectomy and four compartment fasciotomy Right 2009   leg  . TRANSURETHRAL RESECTION OF BLADDER TUMOR WITH GYRUS (TURBT-GYRUS) N/A 10/26/2013   Procedure: TRANSURETHRAL RESECTION OF BLADDER TUMOR WITH GYRUS (TURBT-GYRUS);  Surgeon: Alexis Frock, MD;  [facility];  Service: Urology;  Laterality: N/A;  . TRANSURETHRAL RESECTION OF BLADDER TUMOR WITH GYRUS (TURBT-GYRUS) N/A 01/11/2014   Procedure: TRANSURETHRAL RESECTION OF BLADDER TUMOR WITH GYRUS (TURBT-GYRUS);  Surgeon: Alexis Frock, MD;  [facility];  Service: Urology;  Laterality: N/A;  . WISDOM TOOTH EXTRACTION      Social History   Socioeconomic History  . Marital status: Married    Spouse name: Not on file  . Number of children: Not on file  . Years of education: Not on file  . Highest education level: Not on file  Occupational History  . Occupation: retired     Fish farm manager: RETIRED    Comment: Scientist, clinical (histocompatibility and immunogenetics)  . Financial resource strain: Not on file  . Food insecurity:    Worry: Not on file    Inability: Not on file  . Transportation needs:    Medical: Not on file    Non-medical: Not on file  Tobacco Use  . Smoking status: Former Smoker    Packs/day: 2.00    Years: 50.00    Pack years: 100.00    Types: Cigarettes    Last attempt to quit: 12/08/2006    Years since quitting: 11.2  . Smokeless tobacco: Never Used  Substance and Sexual Activity  . Alcohol use: No    Alcohol/week: 0.0 oz    Comment: quit 3 years ago  . Drug use: No  . Sexual activity: Not Currently  Lifestyle  . Physical activity:    Days per week: Not on file    Minutes per session: Not on file  . Stress: Not on file  Relationships  . Social connections:    Talks on phone: Not on file    Gets  together: Not on file    Attends religious service: Not on file    Active member of club or organization: Not on file    Attends meetings of clubs or organizations: Not on file    Relationship status: Not on file  . Intimate partner violence:    Fear of current or ex partner: Not on file    Emotionally abused: Not on file    Physically abused: Not on file    Forced sexual activity: Not on file  Other Topics Concern  . Not on file  Social History Narrative  . Not on file    Current Outpatient Medications on File Prior to Visit  Medication Sig Dispense Refill  .  acetaminophen (TYLENOL) 500 MG tablet Take 1,000 mg by mouth 2 (two) times daily as needed (pain/headache).     Marland Kitchen albuterol (VENTOLIN HFA) 108 (90 Base) MCG/ACT inhaler Inhale 2 puffs into the lungs every 6 (six) hours as needed for wheezing or shortness of breath. 18 Inhaler 1  . amiodarone (PACERONE) 200 MG tablet TAKE 1 TABLET DAILY (Patient taking differently: TAKE 1 TABLET (200 MG) BY MOUTH DAILY) 90 tablet 3  . amiodarone (PACERONE) 200 MG tablet Take 1 tablet (200 mg total) by mouth daily. 90 tablet 3  . apixaban (ELIQUIS) 5 MG TABS tablet Take 1 tablet (5 mg total) by mouth 2 (two) times daily. 180 tablet 3  . Ascorbic Acid (VITAMIN C) 500 MG tablet Take 500 mg by mouth daily.     . brimonidine (ALPHAGAN P) 0.1 % SOLN Place 1 drop into the left eye 3 (three) times daily.     . budesonide-formoterol (SYMBICORT) 80-4.5 MCG/ACT inhaler Inhale 2 puffs into the lungs 2 (two) times daily. 1 Inhaler 0  . calcium carbonate (TUMS - DOSED IN MG ELEMENTAL CALCIUM) 500 MG chewable tablet Chew 2 tablets by mouth 2 (two) times daily as needed for indigestion or heartburn.    . cholecalciferol (VITAMIN D) 1000 UNITS tablet Take 1,000 Units by mouth 2 (two) times daily.     Marland Kitchen CINNAMON PO Take 2,000 mg by mouth daily.    Marland Kitchen dextromethorphan (DELSYM) 30 MG/5ML liquid Take 90 mg by mouth 2 (two) times daily as needed for cough.    . Digestive  Enzymes (MULTI-ENZYME) TABS Take 1 tablet by mouth daily.    Marland Kitchen docusate sodium (COLACE) 100 MG capsule Take 100-200 mg by mouth daily as needed for mild constipation.     . dorzolamide (TRUSOPT) 2 % ophthalmic solution Place 1 drop into both eyes 2 (two) times daily.    . fluticasone (FLONASE) 50 MCG/ACT nasal spray Place 2 sprays into both nostrils daily.    . furosemide (LASIX) 40 MG tablet Take 1 tablet (40 mg total) by mouth 2 (two) times daily. 180 tablet 3  . guaiFENesin (MUCINEX) 600 MG 12 hr tablet Take 600 mg by mouth 2 (two) times daily as needed for cough or to loosen phlegm.    . Lactobacillus (ACIDOPHILUS) CAPS capsule Take 1 capsule by mouth every evening.     Marland Kitchen levothyroxine (SYNTHROID, LEVOTHROID) 75 MCG tablet Take 1 tablet (75 mcg total) by mouth daily before breakfast. 90 tablet 3  . metoprolol tartrate (LOPRESSOR) 25 MG tablet Take 1 tablet (25 mg total) by mouth 2 (two) times daily. 180 tablet 3  . Multiple Vitamin (MULTIVITAMIN WITH MINERALS) TABS tablet Take 1 tablet by mouth every evening.     Mckinley Jewel Dimesylate (RHOPRESSA) 0.02 % SOLN Place 1 drop into the left eye daily.    . Omega-3 Fatty Acids (FISH OIL) 1000 MG CAPS Take 1,000 mg by mouth daily.     Marland Kitchen omeprazole (PRILOSEC) 40 MG capsule Take 1 capsule (40 mg total) by mouth daily. 90 capsule 3  . Resveratrol 100 MG CAPS Take 100 mg by mouth every evening.    . rosuvastatin (CRESTOR) 40 MG tablet Take 0.5 tablets (20 mg total) by mouth every evening. 45 tablet 3  . Timolol Maleate 0.5 % (DAILY) SOLN Place 1 drop into both eyes 2 (two) times daily.    . traMADol (ULTRAM) 50 MG tablet TAKE 2 TABLETS BY MOUTH TWICE A DAY AS NEEDED FOR PAIN 60 tablet  2  . travoprost, benzalkonium, (TRAVATAN) 0.004 % ophthalmic solution Place 1 drop into the left eye at bedtime.     No current facility-administered medications on file prior to visit.     Allergies  Allergen Reactions  . Actos [Pioglitazone] Swelling  . Timolol  Other (See Comments)    Slow heart rate but tolerates Cosopt (dorzolamide-timolol)    Family History  Problem Relation Age of Onset  . Liver cancer Mother        deceased age 71  . Cancer Mother        liver cancer  . Heart attack Father   . Colon cancer Neg Hx     BP 120/90 (BP Location: Left Arm, Patient Position: Sitting, Cuff Size: Normal)   Pulse 75   Temp 97.8 F (36.6 C) (Oral)   Ht 5\' 10"  (1.778 m)   Wt (!) 326 lb 3.2 oz (148 kg)   SpO2 97%   BMI 46.80 kg/m    Review of Systems He denies LOC    Objective:   Physical Exam VITAL SIGNS:  See vs page GENERAL: no distress Pulses: dorsalis pedis absent bilat (prob due to edema).  MSK: no deformity of the feet or ankles.   CV: 1+ bilat edema of the legs.   Skin:  No leg ulcer, but the skin is dry.  normal color and temp on the feet and ankles. Several healed surgical scars at the right leg. Neuro: sensation is intact to touch on the feet and ankles.   Ext: There is severe bilateral onychomycosis of the toenails.   A1c=6.8%    Assessment & Plan:  Insulin-requiring type 2 DM, with polyneuropathy: slightly overcontrolled Renal insuff: he does not need basal insulin. CAD: in this context: goal a1c is in the low-7's   Patient Instructions  Please reduce the supper insulin to 210 units. check your blood sugar 4 times a day: before the 3 meals, and at bedtime.  also check if you have symptoms of your blood sugar being too high or too low.  please keep a record of the readings and bring it to your next appointment here (or you can bring the meter itself).  You can write it on any piece of paper.  please call us sooner if your blood sugar goes below 70, or if you have a lot of readings over 200. Please come back for a follow-up appointment in 3 months.

## 2018-03-19 ENCOUNTER — Encounter (HOSPITAL_COMMUNITY): Payer: Medicare HMO

## 2018-03-31 DIAGNOSIS — H401133 Primary open-angle glaucoma, bilateral, severe stage: Secondary | ICD-10-CM | POA: Diagnosis not present

## 2018-03-31 DIAGNOSIS — E119 Type 2 diabetes mellitus without complications: Secondary | ICD-10-CM | POA: Diagnosis not present

## 2018-03-31 DIAGNOSIS — H35033 Hypertensive retinopathy, bilateral: Secondary | ICD-10-CM | POA: Diagnosis not present

## 2018-03-31 DIAGNOSIS — Z961 Presence of intraocular lens: Secondary | ICD-10-CM | POA: Diagnosis not present

## 2018-03-31 LAB — HM DIABETES EYE EXAM

## 2018-05-05 ENCOUNTER — Ambulatory Visit: Payer: Medicare HMO | Admitting: Internal Medicine

## 2018-05-05 ENCOUNTER — Encounter: Payer: Self-pay | Admitting: Internal Medicine

## 2018-05-05 VITALS — BP 124/56 | HR 70 | Ht 70.0 in | Wt 307.0 lb

## 2018-05-05 DIAGNOSIS — I5042 Chronic combined systolic (congestive) and diastolic (congestive) heart failure: Secondary | ICD-10-CM | POA: Diagnosis not present

## 2018-05-05 DIAGNOSIS — E119 Type 2 diabetes mellitus without complications: Secondary | ICD-10-CM | POA: Insufficient documentation

## 2018-05-05 DIAGNOSIS — I442 Atrioventricular block, complete: Secondary | ICD-10-CM

## 2018-05-05 DIAGNOSIS — I495 Sick sinus syndrome: Secondary | ICD-10-CM

## 2018-05-05 DIAGNOSIS — Z794 Long term (current) use of insulin: Secondary | ICD-10-CM

## 2018-05-05 DIAGNOSIS — I130 Hypertensive heart and chronic kidney disease with heart failure and stage 1 through stage 4 chronic kidney disease, or unspecified chronic kidney disease: Secondary | ICD-10-CM

## 2018-05-05 DIAGNOSIS — Z7901 Long term (current) use of anticoagulants: Secondary | ICD-10-CM | POA: Insufficient documentation

## 2018-05-05 HISTORY — DX: Long term (current) use of anticoagulants: Z79.01

## 2018-05-05 LAB — CUP PACEART INCLINIC DEVICE CHECK
Battery Remaining Longevity: 18 mo
Battery Voltage: 2.77 V
Date Time Interrogation Session: 20190529125057
Implantable Lead Implant Date: 20110705
Implantable Lead Implant Date: 20110705
Implantable Lead Location: 753860
Implantable Pulse Generator Implant Date: 20110705
Lead Channel Impedance Value: 337.5 Ohm
Lead Channel Pacing Threshold Amplitude: 1 V
Lead Channel Pacing Threshold Amplitude: 1 V
Lead Channel Pacing Threshold Amplitude: 1 V
Lead Channel Pacing Threshold Pulse Width: 0.5 ms
Lead Channel Pacing Threshold Pulse Width: 0.5 ms
Lead Channel Pacing Threshold Pulse Width: 0.5 ms
Lead Channel Sensing Intrinsic Amplitude: 1.8 mV
Lead Channel Setting Pacing Amplitude: 1.125
Lead Channel Setting Sensing Sensitivity: 4 mV
MDC IDC LEAD LOCATION: 753859
MDC IDC MSMT LEADCHNL RA PACING THRESHOLD PULSEWIDTH: 0.5 ms
MDC IDC MSMT LEADCHNL RV IMPEDANCE VALUE: 300 Ohm
MDC IDC MSMT LEADCHNL RV PACING THRESHOLD AMPLITUDE: 1 V
MDC IDC MSMT LEADCHNL RV SENSING INTR AMPL: 4.8 mV
MDC IDC SET LEADCHNL RA PACING AMPLITUDE: 1.75 V
MDC IDC SET LEADCHNL RV PACING PULSEWIDTH: 0.5 ms
MDC IDC STAT BRADY RA PERCENT PACED: 99.9 %
MDC IDC STAT BRADY RV PERCENT PACED: 99.96 %
Pulse Gen Model: 2210
Pulse Gen Serial Number: 7152830

## 2018-05-05 NOTE — Progress Notes (Signed)
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  Since last being seen in our clinic, the patient reports doing reasonably well.  He is not very active.  + fatigue, SOB, and edema. Denies fevers or chills.  Today, he denies symptoms of palpitations, chest pain,  dizziness, presyncope, or syncope.  The patient is otherwise without complaint today.   Past Medical History:  Diagnosis Date  . Anemia    hx of  . Anxiety   . CAD (coronary artery disease)   . Cancer [facility]) 2014   bladder cancer AND RIGHT URETERAL CANCER  . COPD (chronic obstructive pulmonary disease) (HCC)    CPAP  . Depression   . Diabetes mellitus    TypeII  . Diastolic dysfunction with chronic heart failure ([place])   . Gait difficulty    slow gait"ambulates with walker"  . GERD (gastroesophageal reflux disease)   . 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 H/O Legionnaire's disease 2003  . History of blood clots    R groin  . History of kidney stones   . Hyperkalemia   . Hyperlipemia   . Hypertensive cardiovascular-renal disease   . Hypothyroidism   . Morbid obesity ([place])   . OSA on CPAP    using CPAP although it is hard for him to tolerate  . Osteoarthritis    fingers  . Pancreatitis   . Peripheral neuropathy   . Persistent atrial fibrillation ([place])   . Pneumonia 2003  . Renal insufficiency   . Shortness of breath dyspnea    with exertion  . Sick sinus syndrome ([place]) 06/2010   s/p PPM by St. Jude  . Venous insufficiency    Past Surgical History:  Procedure Laterality Date  . BELPHAROPTOSIS REPAIR Right    Glaucoma  . CARDIAC CATHETERIZATION     11-02-13  . CARDIOVERSION N/A 05/16/2014   Procedure: CARDIOVERSION;  Surgeon: Josue Hector, MD;  Location: [facility] ENDOSCOPY;  Service: Cardiovascular;  Laterality: N/A;  . CARDIOVERSION N/A 11/08/2014   Procedure: CARDIOVERSION;  Surgeon: Fay Records, MD;   Location: [place];  Service: Cardiovascular;  Laterality: N/A;  . CARDIOVERSION N/A 06/22/2015   Procedure: CARDIOVERSION;  Surgeon: Jerline Pain, MD;  Location: [place];  Service: Cardiovascular;  Laterality: N/A;  . CHOLECYSTECTOMY  2012  . CYSTOSCOPY W/ URETERAL STENT PLACEMENT Right 10/26/2013   Procedure: CYSTOSCOPY WITH RETROGRADE PYELOGRAM/URETERAL STENT PLACEMENT;  Surgeon: Alexis Frock, MD;  [facility];  Service: Urology;  Laterality: Right;  . CYSTOSCOPY WITH URETEROSCOPY AND STENT PLACEMENT Right 01/11/2014   Procedure: CYSTOSCOPY WITH URETEROSCOPY ,RIGHT RETROGRADE AND STENT CHANGE AND LASER OF URETERAL TUMOR;  Surgeon: Alexis Frock, MD;  [facility];  Service: Urology;  Laterality: Right;  . CYSTOSCOPY/RETROGRADE/URETEROSCOPY Right 08/23/2014   Procedure: CYSTOSCOPY/RETROGRADE/ DIAGNOSTIC URETEROSCOPY/RIGHT RENAL STONE EXTRACTION;  Surgeon: Alexis Frock, MD;  [facility];  Service: Urology;  Laterality: Right;  . EMBOLECTOMY Left 11/02/2013   Procedure: LEFT FEMORAL EMBOLECTOMY, LEFT FEMORAL ARTERY ENDARTERECTOMY WITH DACRON PATCH ANGIOPLASTY.;  Surgeon: Mal Misty, MD;  Location: [place];  Service: Vascular;  Laterality: Left;  . EYE SURGERY Left    cataract  . GROIN DEBRIDEMENT Right 05/08/2015   Procedure: REMOVAL OF RIGHT GROIN MASS;  Surgeon: Angelia Mould, MD;  Location: [place];  Service: Vascular;  Laterality: Right;  .  HOLMIUM LASER APPLICATION Right 06/08/5365   Procedure: HOLMIUM LASER APPLICATION;  Surgeon: Alexis Frock, MD;  [facility];  Service: Urology;  Laterality: Right;  . INSERT / REPLACE / REMOVE PACEMAKER  2011  . NASAL SEPTUM SURGERY  1967  . PACEMAKER PLACEMENT  06/2010   [facility] Accent RF DR, Model [id number] ( Serial number [id number])  . TEE WITHOUT CARDIOVERSION N/A 05/16/2014   Procedure: TRANSESOPHAGEAL ECHOCARDIOGRAM (TEE);  Surgeon: Josue Hector, MD;  Location: [facility] ENDOSCOPY;  Service: Cardiovascular;   Laterality: N/A;  . thromboembolectomy and four compartment fasciotomy Right 2009   leg  . TRANSURETHRAL RESECTION OF BLADDER TUMOR WITH GYRUS (TURBT-GYRUS) N/A 10/26/2013   Procedure: TRANSURETHRAL RESECTION OF BLADDER TUMOR WITH GYRUS (TURBT-GYRUS);  Surgeon: Alexis Frock, MD;  [facility];  Service: Urology;  Laterality: N/A;  . TRANSURETHRAL RESECTION OF BLADDER TUMOR WITH GYRUS (TURBT-GYRUS) N/A 01/11/2014   Procedure: TRANSURETHRAL RESECTION OF BLADDER TUMOR WITH GYRUS (TURBT-GYRUS);  Surgeon: Alexis Frock, MD;  [facility];  Service: Urology;  Laterality: N/A;  . WISDOM TOOTH EXTRACTION      ROS- all systems are reviewed and negative except as per HPI above  Current Outpatient Medications  Medication Sig Dispense Refill  . acetaminophen (TYLENOL) 500 MG tablet Take 1,000 mg by mouth 2 (two) times daily as needed (pain/headache).     Marland Kitchen albuterol (VENTOLIN HFA) 108 (90 Base) MCG/ACT inhaler Inhale 2 puffs into the lungs every 6 (six) hours as needed for wheezing or shortness of breath. 18 Inhaler 1  . amiodarone (PACERONE) 200 MG tablet Take 1 tablet (200 mg total) by mouth daily. 90 tablet 3  . apixaban (ELIQUIS) 5 MG TABS tablet Take 1 tablet (5 mg total) by mouth 2 (two) times daily. 180 tablet 3  . Ascorbic Acid (VITAMIN C) 500 MG tablet Take 500 mg by mouth daily.     . brimonidine (ALPHAGAN P) 0.1 % SOLN Place 1 drop into the left eye 3 (three) times daily.     . calcium carbonate (TUMS - DOSED IN MG ELEMENTAL CALCIUM) 500 MG chewable tablet Chew 2 tablets by mouth 2 (two) times daily as needed for indigestion or heartburn.    . cholecalciferol (VITAMIN D) 1000 UNITS tablet Take 1,000 Units by mouth 2 (two) times daily.     Marland Kitchen CINNAMON PO Take 2,000 mg by mouth daily.    Marland Kitchen dextromethorphan (DELSYM) 30 MG/5ML liquid Take 90 mg by mouth 2 (two) times daily as needed for cough.    . Digestive Enzymes (MULTI-ENZYME) TABS Take 1 tablet by mouth daily.    Marland Kitchen docusate sodium  (COLACE) 100 MG capsule Take 100-200 mg by mouth daily as needed for mild constipation.     . dorzolamide (TRUSOPT) 2 % ophthalmic solution Place 1 drop into both eyes 2 (two) times daily.    . fluticasone (FLONASE) 50 MCG/ACT nasal spray Place 2 sprays into both nostrils daily.    . furosemide (LASIX) 40 MG tablet Take 1 tablet (40 mg total) by mouth 2 (two) times daily. 180 tablet 3  . guaiFENesin (MUCINEX) 600 MG 12 hr tablet Take 600 mg by mouth 2 (two) times daily as needed for cough or to loosen phlegm.    . insulin regular (NOVOLIN R) 100 units/mL injection 4 times a day (just before each meal), 300-210-210-190 units 28 vial 3  . Lactobacillus (ACIDOPHILUS) CAPS capsule Take 1 capsule by mouth every evening.     Marland Kitchen levothyroxine (SYNTHROID, LEVOTHROID)  75 MCG tablet Take 1 tablet (75 mcg total) by mouth daily before breakfast. 90 tablet 3  . metoprolol tartrate (LOPRESSOR) 25 MG tablet Take 1 tablet (25 mg total) by mouth 2 (two) times daily. 180 tablet 3  . Multiple Vitamin (MULTIVITAMIN WITH MINERALS) TABS tablet Take 1 tablet by mouth every evening.     Mckinley Jewel Dimesylate (RHOPRESSA) 0.02 % SOLN Place 1 drop into the left eye daily.    . Omega-3 Fatty Acids (FISH OIL) 1000 MG CAPS Take 1,000 mg by mouth daily.     Marland Kitchen omeprazole (PRILOSEC) 40 MG capsule Take 1 capsule (40 mg total) by mouth daily. 90 capsule 3  . Resveratrol 100 MG CAPS Take 100 mg by mouth every evening.    . rosuvastatin (CRESTOR) 40 MG tablet Take 0.5 tablets (20 mg total) by mouth every evening. 45 tablet 3  . Timolol Maleate 0.5 % (DAILY) SOLN Place 1 drop into both eyes 2 (two) times daily.    . traMADol (ULTRAM) 50 MG tablet TAKE 2 TABLETS BY MOUTH TWICE A DAY AS NEEDED FOR PAIN 60 tablet 2  . travoprost, benzalkonium, (TRAVATAN) 0.004 % ophthalmic solution Place 1 drop into the left eye at bedtime.    . budesonide-formoterol (SYMBICORT) 80-4.5 MCG/ACT inhaler Inhale 2 puffs into the lungs 2 (two) times daily.  (Patient not taking: Reported on 05/05/2018) 1 Inhaler 0   No current facility-administered medications for this visit.     Physical Exam: Vitals:   05/05/18 1128  BP: (!) 124/56  Pulse: 70  SpO2: 97%  Weight: (!) 307 lb (139.3 kg)  Height: 5\' 10"  (1.778 m)    GEN- The patient is dissheveled and obese  Head- normocephalic, atraumatic Eyes-  Sclera clear, conjunctiva pink Ears- hearing intact Oropharynx- clear Lungs- Clear to ausculation bilaterally, normal work of breathing Chest- pacemaker pocket is well healed Heart- Regular rate and rhythm, no murmurs, rubs or gallops, PMI not laterally displaced GI- soft, NT, ND, + BS Extremities- no clubbing, cyanosis, or edema  Pacemaker interrogation- reviewed in detail today,  See PACEART report  ekg tracing ordered today is personally reviewed and shows AV paced rhythm  Assessment and Plan:  1. Symptomatic sinus node dysfunction and complete heart block Normal pacemaker function See Pace Art report No changes today  2. Persistent afib afib burden is 2.2% with amiodarone Doing well Continue long term anticoagulation Labs 02/17/18 reviewed Repeat tfts, lfts upon return Consider reducing amiodarone to 100mg  daily  3. Chronic combined systolic and  diastolic dysfunction 2 gram sodium diet Difficult to manage in the setting of his obesity, renal failure, and adherance EF45% by echo 07/30/17 Today, we discussed possible upgrade of his Pacemaker to CRT-P.  He would like to avoid procedures at this time.  He may be willing to allow LV lead placement at time of pacemaker generator change. We will readdress this when I see him again in a year  4. Obesity Body mass index is 44.05 kg/m. Wt Readings from Last 3 Encounters:  05/05/18 (!) 307 lb (139.3 kg)  03/10/18 (!) 326 lb 3.2 oz (148 kg)  02/17/18 (!) 319 lb 1.9 oz (144.8 kg)   Lifestyle modification is encouraged  5. OSA Compliance with CPAP encouraged  6. Hypertensive  cardio-renal disease Stable No change required today  7. Venous insufficiency Stable No change required today  8. Strep bacteremia/ sepsis secondary to ble cellulits.  Treated medically.  Fortunately, follow-up cultures were negative.    [place] Follow-up  with Truitt Merle in 6 months Return to see me in a year unless problems arise  Thompson Grayer MD, [facility] 05/05/2018 12:45 PM

## 2018-05-05 NOTE — Patient Instructions (Addendum)
Medication Instructions:  Your physician recommends that you continue on your current medications as directed. Please refer to the Current Medication list given to you today.   Labwork: None ordered  Testing/Procedures: None ordered  Follow-Up: Remote monitoring is used to monitor your Pacemaker from home. This monitoring reduces the number of office visits required to check your device to one time per year. It allows [place] to keep an eye on the functioning of your device to ensure it is working properly. You are scheduled for a device check from home on 08/04/18. You may send your transmission at any time that day. If you have a wireless device, the transmission will be sent automatically. After your physician reviews your transmission, you will receive a postcard with your next transmission date.  Your physician recommends that you schedule a follow-up appointment in: 6 months with Truitt Merle, NP on 11/10/18 at 10:30 AM   Your physician wants you to follow-up in: 1 year with Dr. Rayann Heman. You will receive a reminder letter in the mail two months in advance. If you don't receive a letter, please call our office to schedule the follow-up appointment.   Any Other Special Instructions Will Be Listed Below (If Applicable).     If you need a refill on your cardiac medications before your next appointment, please call your pharmacy.

## 2018-05-29 ENCOUNTER — Encounter (HOSPITAL_COMMUNITY): Payer: Self-pay | Admitting: Emergency Medicine

## 2018-05-29 ENCOUNTER — Emergency Department (HOSPITAL_COMMUNITY): Payer: Medicare HMO

## 2018-05-29 ENCOUNTER — Inpatient Hospital Stay (HOSPITAL_COMMUNITY)
Admission: EM | Admit: 2018-05-29 | Discharge: 2018-06-08 | DRG: 871 | Disposition: A | Discharge status: Home Health | Payer: Medicare HMO | Attending: Family Medicine | Admitting: Family Medicine

## 2018-05-29 ENCOUNTER — Other Ambulatory Visit: Payer: Self-pay

## 2018-05-29 DIAGNOSIS — Z79899 Other long term (current) drug therapy: Secondary | ICD-10-CM

## 2018-05-29 DIAGNOSIS — N189 Chronic kidney disease, unspecified: Secondary | ICD-10-CM | POA: Diagnosis not present

## 2018-05-29 DIAGNOSIS — E1165 Type 2 diabetes mellitus with hyperglycemia: Secondary | ICD-10-CM | POA: Diagnosis not present

## 2018-05-29 DIAGNOSIS — I5023 Acute on chronic systolic (congestive) heart failure: Secondary | ICD-10-CM | POA: Diagnosis not present

## 2018-05-29 DIAGNOSIS — R0600 Dyspnea, unspecified: Secondary | ICD-10-CM

## 2018-05-29 DIAGNOSIS — N179 Acute kidney failure, unspecified: Secondary | ICD-10-CM | POA: Diagnosis not present

## 2018-05-29 DIAGNOSIS — K5900 Constipation, unspecified: Secondary | ICD-10-CM | POA: Diagnosis present

## 2018-05-29 DIAGNOSIS — F419 Anxiety disorder, unspecified: Secondary | ICD-10-CM | POA: Diagnosis present

## 2018-05-29 DIAGNOSIS — L03116 Cellulitis of left lower limb: Secondary | ICD-10-CM | POA: Diagnosis not present

## 2018-05-29 DIAGNOSIS — Z66 Do not resuscitate: Secondary | ICD-10-CM | POA: Diagnosis present

## 2018-05-29 DIAGNOSIS — E785 Hyperlipidemia, unspecified: Secondary | ICD-10-CM | POA: Diagnosis present

## 2018-05-29 DIAGNOSIS — H409 Unspecified glaucoma: Secondary | ICD-10-CM | POA: Diagnosis present

## 2018-05-29 DIAGNOSIS — J9601 Acute respiratory failure with hypoxia: Secondary | ICD-10-CM | POA: Diagnosis not present

## 2018-05-29 DIAGNOSIS — Z86718 Personal history of other venous thrombosis and embolism: Secondary | ICD-10-CM | POA: Diagnosis not present

## 2018-05-29 DIAGNOSIS — R0602 Shortness of breath: Secondary | ICD-10-CM | POA: Diagnosis not present

## 2018-05-29 DIAGNOSIS — K219 Gastro-esophageal reflux disease without esophagitis: Secondary | ICD-10-CM | POA: Diagnosis present

## 2018-05-29 DIAGNOSIS — W19XXXA Unspecified fall, initial encounter: Secondary | ICD-10-CM | POA: Diagnosis not present

## 2018-05-29 DIAGNOSIS — E1122 Type 2 diabetes mellitus with diabetic chronic kidney disease: Secondary | ICD-10-CM | POA: Diagnosis present

## 2018-05-29 DIAGNOSIS — L0390 Cellulitis, unspecified: Secondary | ICD-10-CM | POA: Diagnosis present

## 2018-05-29 DIAGNOSIS — E11649 Type 2 diabetes mellitus with hypoglycemia without coma: Secondary | ICD-10-CM | POA: Diagnosis present

## 2018-05-29 DIAGNOSIS — I130 Hypertensive heart and chronic kidney disease with heart failure and stage 1 through stage 4 chronic kidney disease, or unspecified chronic kidney disease: Secondary | ICD-10-CM | POA: Diagnosis not present

## 2018-05-29 DIAGNOSIS — J449 Chronic obstructive pulmonary disease, unspecified: Secondary | ICD-10-CM | POA: Diagnosis present

## 2018-05-29 DIAGNOSIS — Z23 Encounter for immunization: Secondary | ICD-10-CM | POA: Diagnosis not present

## 2018-05-29 DIAGNOSIS — Z950 Presence of cardiac pacemaker: Secondary | ICD-10-CM

## 2018-05-29 DIAGNOSIS — I340 Nonrheumatic mitral (valve) insufficiency: Secondary | ICD-10-CM | POA: Diagnosis not present

## 2018-05-29 DIAGNOSIS — R6520 Severe sepsis without septic shock: Secondary | ICD-10-CM | POA: Diagnosis not present

## 2018-05-29 DIAGNOSIS — Z7901 Long term (current) use of anticoagulants: Secondary | ICD-10-CM

## 2018-05-29 DIAGNOSIS — Z8551 Personal history of malignant neoplasm of bladder: Secondary | ICD-10-CM

## 2018-05-29 DIAGNOSIS — A419 Sepsis, unspecified organism: Secondary | ICD-10-CM | POA: Diagnosis not present

## 2018-05-29 DIAGNOSIS — Z9049 Acquired absence of other specified parts of digestive tract: Secondary | ICD-10-CM

## 2018-05-29 DIAGNOSIS — E1142 Type 2 diabetes mellitus with diabetic polyneuropathy: Secondary | ICD-10-CM | POA: Diagnosis present

## 2018-05-29 DIAGNOSIS — I2510 Atherosclerotic heart disease of native coronary artery without angina pectoris: Secondary | ICD-10-CM | POA: Diagnosis present

## 2018-05-29 DIAGNOSIS — I4891 Unspecified atrial fibrillation: Secondary | ICD-10-CM | POA: Diagnosis present

## 2018-05-29 DIAGNOSIS — E039 Hypothyroidism, unspecified: Secondary | ICD-10-CM | POA: Diagnosis not present

## 2018-05-29 DIAGNOSIS — L270 Generalized skin eruption due to drugs and medicaments taken internally: Secondary | ICD-10-CM | POA: Diagnosis not present

## 2018-05-29 DIAGNOSIS — L271 Localized skin eruption due to drugs and medicaments taken internally: Secondary | ICD-10-CM | POA: Diagnosis not present

## 2018-05-29 DIAGNOSIS — Z87891 Personal history of nicotine dependence: Secondary | ICD-10-CM

## 2018-05-29 DIAGNOSIS — I5043 Acute on chronic combined systolic (congestive) and diastolic (congestive) heart failure: Secondary | ICD-10-CM | POA: Diagnosis not present

## 2018-05-29 DIAGNOSIS — N183 Chronic kidney disease, stage 3 unspecified: Secondary | ICD-10-CM

## 2018-05-29 DIAGNOSIS — R0902 Hypoxemia: Secondary | ICD-10-CM | POA: Diagnosis not present

## 2018-05-29 DIAGNOSIS — L03115 Cellulitis of right lower limb: Secondary | ICD-10-CM | POA: Diagnosis present

## 2018-05-29 DIAGNOSIS — E08649 Diabetes mellitus due to underlying condition with hypoglycemia without coma: Secondary | ICD-10-CM | POA: Diagnosis not present

## 2018-05-29 DIAGNOSIS — I480 Paroxysmal atrial fibrillation: Secondary | ICD-10-CM | POA: Diagnosis not present

## 2018-05-29 DIAGNOSIS — G4733 Obstructive sleep apnea (adult) (pediatric): Secondary | ICD-10-CM | POA: Diagnosis present

## 2018-05-29 DIAGNOSIS — I481 Persistent atrial fibrillation: Secondary | ICD-10-CM | POA: Diagnosis not present

## 2018-05-29 DIAGNOSIS — S0990XA Unspecified injury of head, initial encounter: Secondary | ICD-10-CM | POA: Diagnosis not present

## 2018-05-29 DIAGNOSIS — I350 Nonrheumatic aortic (valve) stenosis: Secondary | ICD-10-CM

## 2018-05-29 DIAGNOSIS — T368X5A Adverse effect of other systemic antibiotics, initial encounter: Secondary | ICD-10-CM | POA: Diagnosis not present

## 2018-05-29 DIAGNOSIS — R509 Fever, unspecified: Secondary | ICD-10-CM | POA: Diagnosis not present

## 2018-05-29 DIAGNOSIS — D649 Anemia, unspecified: Secondary | ICD-10-CM | POA: Diagnosis present

## 2018-05-29 DIAGNOSIS — F329 Major depressive disorder, single episode, unspecified: Secondary | ICD-10-CM | POA: Diagnosis present

## 2018-05-29 DIAGNOSIS — R531 Weakness: Secondary | ICD-10-CM | POA: Diagnosis not present

## 2018-05-29 DIAGNOSIS — T380X5A Adverse effect of glucocorticoids and synthetic analogues, initial encounter: Secondary | ICD-10-CM | POA: Diagnosis present

## 2018-05-29 DIAGNOSIS — Z794 Long term (current) use of insulin: Secondary | ICD-10-CM

## 2018-05-29 DIAGNOSIS — I878 Other specified disorders of veins: Secondary | ICD-10-CM | POA: Diagnosis present

## 2018-05-29 DIAGNOSIS — Z8554 Personal history of malignant neoplasm of ureter: Secondary | ICD-10-CM

## 2018-05-29 DIAGNOSIS — I959 Hypotension, unspecified: Secondary | ICD-10-CM | POA: Diagnosis not present

## 2018-05-29 DIAGNOSIS — Z79890 Hormone replacement therapy (postmenopausal): Secondary | ICD-10-CM

## 2018-05-29 DIAGNOSIS — Z8249 Family history of ischemic heart disease and other diseases of the circulatory system: Secondary | ICD-10-CM

## 2018-05-29 LAB — CBC WITH DIFFERENTIAL/PLATELET
Basophils Absolute: 0 10*3/uL (ref 0.0–0.1)
Basophils Relative: 0 %
Eosinophils Absolute: 0 10*3/uL (ref 0.0–0.7)
Eosinophils Relative: 0 %
HCT: 32.9 % — ABNORMAL LOW (ref 39.0–52.0)
HEMOGLOBIN: 10 g/dL — AB (ref 13.0–17.0)
LYMPHS ABS: 1.1 10*3/uL (ref 0.7–4.0)
LYMPHS PCT: 9 %
MCH: 23.4 pg — AB (ref 26.0–34.0)
MCHC: 30.4 g/dL (ref 30.0–36.0)
MCV: 77 fL — AB (ref 78.0–100.0)
Monocytes Absolute: 1.2 10*3/uL — ABNORMAL HIGH (ref 0.1–1.0)
Monocytes Relative: 10 %
NEUTROS ABS: 10.5 10*3/uL — AB (ref 1.7–7.7)
NEUTROS PCT: 81 %
Platelets: 220 10*3/uL (ref 150–400)
RBC: 4.27 MIL/uL (ref 4.22–5.81)
RDW: 20.6 % — ABNORMAL HIGH (ref 11.5–15.5)
WBC: 12.8 10*3/uL — ABNORMAL HIGH (ref 4.0–10.5)

## 2018-05-29 LAB — COMPREHENSIVE METABOLIC PANEL
ALT: 15 U/L — ABNORMAL LOW (ref 17–63)
AST: 60 U/L — ABNORMAL HIGH (ref 15–41)
Albumin: 3.2 g/dL — ABNORMAL LOW (ref 3.5–5.0)
Alkaline Phosphatase: 92 U/L (ref 38–126)
Anion gap: 11 (ref 5–15)
BUN: 33 mg/dL — ABNORMAL HIGH (ref 6–20)
CHLORIDE: 100 mmol/L — AB (ref 101–111)
CO2: 25 mmol/L (ref 22–32)
Calcium: 8.7 mg/dL — ABNORMAL LOW (ref 8.9–10.3)
Creatinine, Ser: 1.95 mg/dL — ABNORMAL HIGH (ref 0.61–1.24)
GFR, EST AFRICAN AMERICAN: 37 mL/min — AB (ref >60)
GFR, EST NON AFRICAN AMERICAN: 32 mL/min — AB (ref >60)
Glucose, Bld: 289 mg/dL — ABNORMAL HIGH (ref 65–99)
POTASSIUM: 4.2 mmol/L (ref 3.5–5.1)
Sodium: 136 mmol/L (ref 135–145)
Total Bilirubin: 1.1 mg/dL (ref 0.3–1.2)
Total Protein: 7.6 g/dL (ref 6.5–8.1)

## 2018-05-29 LAB — URINALYSIS, ROUTINE W REFLEX MICROSCOPIC
Bilirubin Urine: NEGATIVE
GLUCOSE, UA: 50 mg/dL — AB
KETONES UR: NEGATIVE mg/dL
Nitrite: NEGATIVE
PROTEIN: 100 mg/dL — AB
Specific Gravity, Urine: 1.012 (ref 1.005–1.030)
pH: 5 (ref 5.0–8.0)

## 2018-05-29 LAB — I-STAT CHEM 8, ED
BUN: 31 mg/dL — ABNORMAL HIGH (ref 6–20)
Calcium, Ion: 1.09 mmol/L — ABNORMAL LOW (ref 1.15–1.40)
Chloride: 98 mmol/L — ABNORMAL LOW (ref 101–111)
Creatinine, Ser: 1.8 mg/dL — ABNORMAL HIGH (ref 0.61–1.24)
Glucose, Bld: 287 mg/dL — ABNORMAL HIGH (ref 65–99)
HCT: 28 % — ABNORMAL LOW (ref 39.0–52.0)
HEMOGLOBIN: 9.5 g/dL — AB (ref 13.0–17.0)
POTASSIUM: 4 mmol/L (ref 3.5–5.1)
Sodium: 136 mmol/L (ref 135–145)
TCO2: 24 mmol/L (ref 22–32)

## 2018-05-29 LAB — BRAIN NATRIURETIC PEPTIDE: B NATRIURETIC PEPTIDE 5: 789.6 pg/mL — AB (ref 0.0–100.0)

## 2018-05-29 LAB — I-STAT CG4 LACTIC ACID, ED: LACTIC ACID, VENOUS: 4.56 mmol/L — AB (ref 0.5–1.9)

## 2018-05-29 MED ORDER — PIPERACILLIN-TAZOBACTAM 3.375 G IVPB 30 MIN
3.3750 g | Freq: Once | INTRAVENOUS | Status: AC
  Administered 2018-05-29: 3.375 g via INTRAVENOUS
  Filled 2018-05-29: qty 50

## 2018-05-29 MED ORDER — VANCOMYCIN HCL 10 G IV SOLR
2000.0000 mg | Freq: Once | INTRAVENOUS | Status: AC
  Administered 2018-05-29: 2000 mg via INTRAVENOUS
  Filled 2018-05-29: qty 2000

## 2018-05-29 MED ORDER — SODIUM CHLORIDE 0.9 % IV BOLUS
500.0000 mL | Freq: Once | INTRAVENOUS | Status: AC
  Administered 2018-05-29: 500 mL via INTRAVENOUS

## 2018-05-29 MED ORDER — VANCOMYCIN HCL IN DEXTROSE 1-5 GM/200ML-% IV SOLN: 1000.0000 mg | Freq: Once | INTRAVENOUS | Status: DC

## 2018-05-29 MED ORDER — SODIUM CHLORIDE 0.9 % IV BOLUS (SEPSIS): 400.0000 mL | Freq: Once | INTRAVENOUS | Status: DC

## 2018-05-29 MED ORDER — SODIUM CHLORIDE 0.9 % IV BOLUS (SEPSIS): 1000.0000 mL | Freq: Once | INTRAVENOUS | Status: DC

## 2018-05-29 NOTE — ED Triage Notes (Signed)
Patient brought in by [place]. Patient is on Eliquis. Patient fell and hit his head. Patient lost his balance. Patient has not been eating well and has been nauseated. Patient did not loose consciousness.

## 2018-05-29 NOTE — ED Notes (Signed)
Bed: WA09 Expected date:  Expected time:  Means of arrival:  Comments: 77 yo M fall

## 2018-05-29 NOTE — ED Notes (Signed)
Patient went to ct and xray

## 2018-05-29 NOTE — ED Provider Notes (Signed)
[facility] DEPT Provider Note   CSN: [id number] Arrival date & time: 05/29/18  2100     History   Chief Complaint Chief Complaint  Patient presents with  . Fall  . Fever  . Leg Swelling    HPI [name] is a 77 y.o. male.  The history is provided by the patient. No language interpreter was used.  Fall   Fever      [name] is a 77 y.o. male who presents to the Emergency Department complaining of weakness. He presents to the emergency department accompanied by family for evaluation of weakness for the last few days. He endorses generalized weakness with difficulty ambulating and shortness of breath for the last 3 to 4 days. Prior to ED arrival he had a fall due to being unable to move his legs. He also has bilateral lower extremity edema and redness to the right leg that is worse than his baseline. He feels fevers at home but has not checked his temperature. Denies any chest pain. Symptoms are severe, constant, worsening.  Past Medical History:  Diagnosis Date  . Anemia    hx of  . Anxiety   . CAD (coronary artery disease)   . Cancer [facility]) 2014   bladder cancer AND RIGHT URETERAL CANCER  . COPD (chronic obstructive pulmonary disease) (HCC)    CPAP  . Depression   . Diabetes mellitus    TypeII  . Diastolic dysfunction with chronic heart failure ([place])   . Gait difficulty    slow gait"ambulates with walker"  . GERD (gastroesophageal reflux disease)   . 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 H/O Legionnaire's disease 2003  . History of blood clots    R groin  . History of kidney stones   . Hyperkalemia   . Hyperlipemia   . Hypertensive cardiovascular-renal disease   . Hypothyroidism   . Morbid obesity ([place])   . OSA on CPAP    using CPAP although it is hard for him to tolerate  . Osteoarthritis    fingers  . Pancreatitis   . Peripheral neuropathy   . Persistent atrial fibrillation ([place])   . Pneumonia 2003  .  Renal insufficiency   . Shortness of breath dyspnea    with exertion  . Sick sinus syndrome ([place]) 06/2010   s/p PPM by [employer]  . Venous insufficiency     Patient Active Problem List   Diagnosis Date Noted  . Cellulitis 05/30/2018  . Anticoagulated on Coumadin 05/05/2018  . Insulin dependent diabetes mellitus ([place]) 05/05/2018  . S/P PICC central line placement 02/04/2018  . Ulcers of both lower legs ([place]) 01/27/2018  . Bacteremia due to group B Streptococcus   . Dyspnea 04/30/2016  . Diabetes mellitus type 2 in obese ([place]) 01/20/2016  . Paroxysmal atrial fibrillation (HCC)   . Wrist pain 01/30/2015  . De Quervain's tenosynovitis, right 01/03/2015  . Sick sinus syndrome ([place]) 07/02/2014  . Complete heart block ([place]) 07/02/2014  . Trigger finger, acquired 06/07/2014  . Shortness of breath 05/13/2014  . CKD (chronic kidney disease) 05/13/2014  . Encounter for therapeutic drug monitoring 02/15/2014  . Bladder cancer ([place]) 12/14/2013  . Thrombus 12/05/2013  . Ischemic leg 11/01/2013  . Low back pain 10/20/2013  . Depressive disorder, not elsewhere classified 10/20/2013  . Primary open angle glaucoma of both eyes, severe stage 08/06/2013  . Venous insufficiency 07/07/2013  . Postural dizziness  07/07/2013  . Encounter for long-term (current) use of other medications 06/27/2013  . Pseudophakia of both eyes 12/31/2012  . S/P placement of cardiac pacemaker 12/31/2012  . Obstructive sleep apnea on CPAP 12/31/2012  . A-fib ([place]) 06/25/2011  . Screening for prostate cancer 05/06/2011  . Pre-operative cardiovascular examination 04/29/2011  . Epigastric abdominal pain 03/13/2011  . 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 B12 DEFICIENCY 12/18/2010  . PANCREATITIS 12/17/2010  . PSEUDOCYST, PANCREAS 12/17/2010  . DIASTOLIC HEART FAILURE, CHRONIC 04/16/2010  . SINUSITIS, CHRONIC 12/11/2009  . Obstructive sleep apnea 11/08/2009  . Disorder resulting from impaired renal function  11/06/2009  . OBESITY-MORBID (>100') 10/29/2009  . HYPOGONADISM 10/01/2009  . 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 HEARING LOSS 04/26/2009  . FATTY LIVER DISEASE 06/20/2008  . Glaucoma 05/18/2008  . COPD (chronic obstructive pulmonary disease) ([place]) 05/18/2008  . SEBACEOUS CYST 03/28/2008  . HYPERCHOLESTEROLEMIA 01/18/2008  . 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 Coronary atherosclerosis 07/06/2007  . GERD 07/06/2007    Past Surgical History:  Procedure Laterality Date  . BELPHAROPTOSIS REPAIR Right    Glaucoma  . CARDIAC CATHETERIZATION     11-02-13  . CARDIOVERSION N/A 05/16/2014   Procedure: CARDIOVERSION;  Surgeon: Josue Hector, MD;  Location: [facility] ENDOSCOPY;  Service: Cardiovascular;  Laterality: N/A;  . CARDIOVERSION N/A 11/08/2014   Procedure: CARDIOVERSION;  Surgeon: Fay Records, MD;  Location: [place];  Service: Cardiovascular;  Laterality: N/A;  . CARDIOVERSION N/A 06/22/2015   Procedure: CARDIOVERSION;  Surgeon: Jerline Pain, MD;  Location: [place];  Service: Cardiovascular;  Laterality: N/A;  . CHOLECYSTECTOMY  2012  . CYSTOSCOPY W/ URETERAL STENT PLACEMENT Right 10/26/2013   Procedure: CYSTOSCOPY WITH RETROGRADE PYELOGRAM/URETERAL STENT PLACEMENT;  Surgeon: [place] Frock, MD;  Location: [facility] ORS;  Service: Urology;  Laterality: Right;  . CYSTOSCOPY WITH URETEROSCOPY AND STENT PLACEMENT Right 01/11/2014   Procedure: CYSTOSCOPY WITH URETEROSCOPY ,RIGHT RETROGRADE AND STENT CHANGE AND LASER OF URETERAL TUMOR;  Surgeon: [place] Frock, MD;  Location: [facility] ORS;  Service: Urology;  Laterality: Right;  . CYSTOSCOPY/RETROGRADE/URETEROSCOPY Right 08/23/2014   Procedure: CYSTOSCOPY/RETROGRADE/ DIAGNOSTIC URETEROSCOPY/RIGHT RENAL STONE EXTRACTION;  Surgeon: [place] Frock, MD;  Location: [facility];  Service: Urology;  Laterality: Right;  . EMBOLECTOMY Left 11/02/2013   Procedure: LEFT FEMORAL EMBOLECTOMY, LEFT FEMORAL ARTERY ENDARTERECTOMY WITH DACRON  PATCH ANGIOPLASTY.;  Surgeon: Mal Misty, MD;  Location: [place];  Service: Vascular;  Laterality: Left;  . EYE SURGERY Left    cataract  . GROIN DEBRIDEMENT Right 05/08/2015   Procedure: REMOVAL OF RIGHT GROIN MASS;  Surgeon: Angelia Mould, MD;  Location: [place];  Service: Vascular;  Laterality: Right;  . HOLMIUM LASER APPLICATION Right 0/12/270   Procedure: HOLMIUM LASER APPLICATION;  Surgeon: [place] Frock, MD;  Location: [facility];  Service: Urology;  Laterality: Right;  . INSERT / REPLACE / REMOVE PACEMAKER  2011  . NASAL SEPTUM SURGERY  1967  . PACEMAKER PLACEMENT  06/2010   [employer] Accent RF DR, Model [id number] ( Serial number [id number])  . TEE WITHOUT CARDIOVERSION N/A 05/16/2014   Procedure: TRANSESOPHAGEAL ECHOCARDIOGRAM (TEE);  Surgeon: Josue Hector, MD;  Location[facility] ENDOSCOPY;  Service: Cardiovascular;  Laterality: N/A;  . thromboembolectomy and four compartment fasciotomy Right 2009   leg  . TRANSURETHRAL RESECTION OF BLADDER TUMOR WITH GYRUS (TURBT-GYRUS) N/A 10/26/2013   Procedure: TRANSURETHRAL RESECTION OF BLADDER TUMOR WITH GYRUS (TURBT-GYRUS);  Surgeon: [place] Frock, MD;  Location: [facility];  Service: Urology;  Laterality:  N/A;  . TRANSURETHRAL RESECTION OF BLADDER TUMOR WITH GYRUS (TURBT-GYRUS) N/A 01/11/2014   Procedure: TRANSURETHRAL RESECTION OF BLADDER TUMOR WITH GYRUS (TURBT-GYRUS);  Surgeon: [place] Frock, MD;  Location: [facility];  Service: Urology;  Laterality: N/A;  . WISDOM TOOTH EXTRACTION          Home Medications    Prior to Admission medications   Medication Sig Start Date End Date Taking? Authorizing Provider  acetaminophen (TYLENOL) 500 MG tablet Take 1,000 mg by mouth 2 (two) times daily as needed (pain/headache).     [provider]  albuterol (VENTOLIN HFA) 108 (90 Base) MCG/ACT inhaler Inhale 2 puffs into the lungs every 6 (six) hours as needed for wheezing or shortness of breath. 11/23/17   Parrett, Fonnie Mu, NP  amiodarone  (PACERONE) 200 MG tablet Take 1 tablet (200 mg total) by mouth daily. 01/06/18   Burtis Junes, NP  apixaban (ELIQUIS) 5 MG TABS tablet Take 1 tablet (5 mg total) by mouth 2 (two) times daily. 01/06/18   Burtis Junes, NP  Ascorbic Acid (VITAMIN C) 500 MG tablet Take 500 mg by mouth daily.     [provider]  brimonidine (ALPHAGAN P) 0.1 % SOLN Place 1 drop into the left eye 3 (three) times daily.     [provider]  budesonide-formoterol (SYMBICORT) 80-4.5 MCG/ACT inhaler Inhale 2 puffs into the lungs 2 (two) times daily. Patient not taking: Reported on 05/05/2018 11/23/17   Parrett, Fonnie Mu, NP  calcium carbonate (TUMS - DOSED IN MG ELEMENTAL CALCIUM) 500 MG chewable tablet Chew 2 tablets by mouth 2 (two) times daily as needed for indigestion or heartburn.    [provider]  cholecalciferol (VITAMIN D) 1000 UNITS tablet Take 1,000 Units by mouth 2 (two) times daily.     [provider]  CINNAMON PO Take 2,000 mg by mouth daily.    [provider]  dextromethorphan (DELSYM) 30 MG/5ML liquid Take 90 mg by mouth 2 (two) times daily as needed for cough.    [provider]  Digestive Enzymes (MULTI-ENZYME) TABS Take 1 tablet by mouth daily.    [provider]  docusate sodium (COLACE) 100 MG capsule Take 100-200 mg by mouth daily as needed for mild constipation.     [provider]  dorzolamide (TRUSOPT) 2 % ophthalmic solution Place 1 drop into both eyes 2 (two) times daily.    [provider]  fluticasone (FLONASE) 50 MCG/ACT nasal spray Place 2 sprays into both nostrils daily.    [provider]  furosemide (LASIX) 40 MG tablet Take 1 tablet (40 mg total) by mouth 2 (two) times daily. 01/06/18   Burtis Junes, NP  guaiFENesin (MUCINEX) 600 MG 12 hr tablet Take 600 mg by mouth 2 (two) times daily as needed for cough or to loosen phlegm.    [provider]  insulin regular (NOVOLIN R) 100 units/mL  injection 4 times a day (just before each meal), 300-210-210-190 units 03/10/18   Renato Shin, MD  Lactobacillus (ACIDOPHILUS) CAPS capsule Take 1 capsule by mouth every evening.     [provider]  levothyroxine (SYNTHROID, LEVOTHROID) 75 MCG tablet Take 1 tablet (75 mcg total) by mouth daily before breakfast. 01/27/18   Renato Shin, MD  metoprolol tartrate (LOPRESSOR) 25 MG tablet Take 1 tablet (25 mg total) by mouth 2 (two) times daily. 01/06/18   Burtis Junes, NP  Multiple Vitamin (MULTIVITAMIN WITH MINERALS) TABS tablet Take 1 tablet  by mouth every evening.     [provider]  Netarsudil Dimesylate (RHOPRESSA) 0.02 % SOLN Place 1 drop into the left eye daily.    [provider]  Omega-3 Fatty Acids (FISH OIL) 1000 MG CAPS Take 1,000 mg by mouth daily.     [provider]  omeprazole (PRILOSEC) 40 MG capsule Take 1 capsule (40 mg total) by mouth daily. 01/27/18   Renato Shin, MD  Resveratrol 100 MG CAPS Take 100 mg by mouth every evening.    [provider]  rosuvastatin (CRESTOR) 40 MG tablet Take 0.5 tablets (20 mg total) by mouth every evening. 01/27/18   Renato Shin, MD  Timolol Maleate 0.5 % (DAILY) SOLN Place 1 drop into both eyes 2 (two) times daily.    [provider]  traMADol (ULTRAM) 50 MG tablet TAKE 2 TABLETS BY MOUTH TWICE A DAY AS NEEDED FOR PAIN 03/08/18   Renato Shin, MD  travoprost, benzalkonium, (TRAVATAN) 0.004 % ophthalmic solution Place 1 drop into the left eye at bedtime.    [provider]    Family History Family History  Problem Relation Age of Onset  . Liver cancer Mother        deceased age 64  . Cancer Mother        liver cancer  . Heart attack Father   . Colon cancer Neg Hx     Social History Social History   Tobacco Use  . Smoking status: Former Smoker    Packs/day: 2.00    Years: 50.00    Pack years: 100.00    Types: Cigarettes    Last attempt to quit: 12/08/2006    Years since  quitting: 11.4  . Smokeless tobacco: Never Used  Substance Use Topics  . Alcohol use: No    Alcohol/week: 0.0 oz    Comment: quit 3 years ago  . Drug use: No     Allergies   Actos [pioglitazone] and Timolol   Review of Systems Review of Systems  Constitutional: Positive for fever.  All other systems reviewed and are negative.    Physical Exam Updated Vital Signs BP (!) 90/53   Pulse 76   Temp 100.3 F (37.9 C) (Oral)   Resp 17   Ht 5\' 10"  (1.778 m)   Wt (!) 141.5 kg (312 lb)   SpO2 100%   BMI 44.77 kg/m   Physical Exam  Constitutional: He is oriented to person, place, and time. He appears well-developed and well-nourished.  Chronically ill appearing  HENT:  Head: Normocephalic and atraumatic.  Cardiovascular: Normal rate and regular rhythm.  Distance heart sounds. Systolic ejection murmur  Pulmonary/Chest: Effort normal. No respiratory distress.  Decreased air movement and bilateral basis  Abdominal: Soft. There is no tenderness. There is no rebound and no guarding.  Musculoskeletal:  Chronic venous stasis changes to bilateral lower extremities. There is not pitting edema to bilateral lower extremities. There is marked erythema to the right lower extremity. There is erythema and edema to the lower abdominal wall.  Neurological: He is alert and oriented to person, place, and time.  Skin: Skin is warm and dry.  Psychiatric: He has a normal mood and affect. His behavior is normal.  Nursing note and vitals reviewed.    ED Treatments / Results  Labs (all labs ordered are listed, but only abnormal results are displayed) Labs Reviewed  COMPREHENSIVE METABOLIC PANEL - Abnormal; Notable for the following components:      Result Value  Chloride 100 (*)    Glucose, Bld 289 (*)    BUN 33 (*)    Creatinine, Ser 1.95 (*)    Calcium 8.7 (*)    Albumin 3.2 (*)    AST 60 (*)    ALT 15 (*)    GFR calc non Af Amer 32 (*)    GFR calc Af Amer 37 (*)    All other  components within normal limits  CBC WITH DIFFERENTIAL/PLATELET - Abnormal; Notable for the following components:   WBC 12.8 (*)    Hemoglobin 10.0 (*)    HCT 32.9 (*)    MCV 77.0 (*)    MCH 23.4 (*)    RDW 20.6 (*)    Neutro Abs 10.5 (*)    Monocytes Absolute 1.2 (*)    All other components within normal limits  URINALYSIS, ROUTINE W REFLEX MICROSCOPIC - Abnormal; Notable for the following components:   Glucose, UA 50 (*)    Hgb urine dipstick MODERATE (*)    Protein, ur 100 (*)    Leukocytes, UA LARGE (*)    RBC / HPF >50 (*)    WBC, UA >50 (*)    Bacteria, UA MANY (*)    All other components within normal limits  BRAIN NATRIURETIC PEPTIDE - Abnormal; Notable for the following components:   B Natriuretic Peptide 789.6 (*)    All other components within normal limits  I-STAT CG4 LACTIC ACID, ED - Abnormal; Notable for the following components:   Lactic Acid, Venous 4.56 (*)    All other components within normal limits  I-STAT CHEM 8, ED - Abnormal; Notable for the following components:   Chloride 98 (*)    BUN 31 (*)    Creatinine, Ser 1.80 (*)    Glucose, Bld 287 (*)    Calcium, Ion 1.09 (*)    Hemoglobin 9.5 (*)    HCT 28.0 (*)    All other components within normal limits  I-STAT CG4 LACTIC ACID, ED - Abnormal; Notable for the following components:   Lactic Acid, Venous 2.66 (*)    All other components within normal limits  CULTURE, BLOOD (ROUTINE X 2)  CULTURE, BLOOD (ROUTINE X 2)  BASIC METABOLIC PANEL  CBC  VANCOMYCIN, RANDOM  I-STAT TROPONIN, ED    EKG EKG Interpretation  Date/Time:  Saturday May 29 2018 22:28:11 EDT Ventricular Rate:  79 PR Interval:    QRS Duration: 197 QT Interval:  543 QTC Calculation: 623 R Axis:   -73 Text Interpretation:  Ventricular-paced complexes No further analysis attempted due to paced rhythm Confirmed by Quintella Reichert [id number]) on 05/29/2018 10:30:42 PM   Radiology Dg Chest 2 View  Result Date: 05/29/2018 CLINICAL  DATA:  Nausea, fever, shortness of breath, ex-smoker. EXAM: CHEST - 2 VIEW COMPARISON:  12/30/2017. FINDINGS: Enlarged cardiac silhouette with an interval decrease in size. Mildly prominent pulmonary vasculature. Clear lungs with resolved prominence of the interstitial markings. The lungs are mildly hyperexpanded with mild peribronchial thickening. Stable left subclavian pacemaker leads. Thoracic spine degenerative changes. IMPRESSION: 1. Mild cardiomegaly with improvement. 2. Mild pulmonary vascular congestion. 3. Resolved interstitial pulmonary edema. 4. Mild changes of COPD and chronic bronchitis. Electronically Signed   By: Claudie Revering M.D.   On: 05/29/2018 23:01   Ct Head Wo Contrast  Result Date: 05/29/2018 CLINICAL DATA:  [place] and hit his head. EXAM: CT HEAD WITHOUT CONTRAST TECHNIQUE: Contiguous axial images were obtained from the base of the skull through the vertex without  intravenous contrast. COMPARISON:  04/26/2008. FINDINGS: Brain: Diffusely enlarged ventricles and subarachnoid spaces. Patchy white matter low density in both cerebral hemispheres. No intracranial hemorrhage, mass lesion or CT evidence of acute infarction. Vascular: No hyperdense vessel or unexpected calcification. Skull: Normal. Negative for fracture or focal lesion. Sinuses/Orbits: Unremarkable paranasal sinuses. Status post bilateral cataract extraction and right globe band. Other: None. IMPRESSION: 1. No acute abnormality. 2. Mild diffuse cerebral and cerebellar atrophy. 3. Minimal chronic small vessel white matter ischemic changes in both cerebral hemispheres. Electronically Signed   By: Claudie Revering M.D.   On: 05/29/2018 23:10    Procedures Procedures (including critical care time) CRITICAL CARE Performed by: Quintella Reichert   Total critical care time: 35 minutes  Critical care time was exclusive of separately billable procedures and treating other patients.  Critical care was necessary to treat or prevent imminent  or life-threatening deterioration.  Critical care was time spent personally by me on the following activities: development of treatment plan with patient and/or surrogate as well as nursing, discussions with consultants, evaluation of patient's response to treatment, examination of patient, obtaining history from patient or surrogate, ordering and performing treatments and interventions, ordering and review of laboratory studies, ordering and review of radiographic studies, pulse oximetry and re-evaluation of patient's condition.  Medications Ordered in ED Medications  vancomycin (VANCOCIN) 2,000 mg in sodium chloride 0.9 % 500 mL IVPB (2,000 mg Intravenous New Bag/Given 05/29/18 2254)  amiodarone (PACERONE) tablet 200 mg (has no administration in time range)  apixaban (ELIQUIS) tablet 2.5 mg (has no administration in time range)  brimonidine (ALPHAGAN) 0.15 % ophthalmic solution 1 drop (has no administration in time range)  travoprost (benzalkonium) (TRAVATAN) 0.004 % ophthalmic solution 1 drop (has no administration in time range)  traMADol (ULTRAM) tablet 50 mg (has no administration in time range)  calcium carbonate (TUMS - dosed in mg elemental calcium) chewable tablet 400 mg of elemental calcium (has no administration in time range)  aspirin EC tablet 81 mg (has no administration in time range)  insulin aspart (novoLOG) injection 0-15 Units (has no administration in time range)  piperacillin-tazobactam (ZOSYN) IVPB 3.375 g (has no administration in time range)  levothyroxine (SYNTHROID, LEVOTHROID) tablet 75 mcg (has no administration in time range)  ipratropium-albuterol (DUONEB) 0.5-2.5 (3) MG/3ML nebulizer solution 3 mL (has no administration in time range)  piperacillin-tazobactam (ZOSYN) IVPB 3.375 g (0 g Intravenous Stopped 05/29/18 2254)  sodium chloride 0.9 % bolus 500 mL (0 mLs Intravenous Stopped 05/29/18 2318)  sodium chloride 0.9 % bolus 500 mL (500 mLs Intravenous New Bag/Given  05/29/18 2322)  furosemide (LASIX) injection 40 mg (40 mg Intravenous Given 05/30/18 0011)     Initial Impression / Assessment and Plan / ED Course  I have reviewed the triage vital signs and the nursing notes.  Pertinent labs & imaging results that were available during my care of the patient were reviewed by me and considered in my medical decision making (see chart for details).     Patient here for evaluation of weakness, fall, fever. He is ill appearing on examination. Exam is concerning for volume overload as well as cellulitis. He was treated with broad spectrum antibiotics for presumed sepsis. He is DNR and would not want resuscitation in the event of cardiac arrest. Given DNR status and appearance of volume overload he was provided with gentle IV fluid hydration instead of aggressive fluid hydration. Patient and family updated findings of studies recommendation for admission and they are in agreement  with plan. Hospitalist consulted for admission for further treatment.   Records reviewed - hx/o GBS bacteremia s/p PICC line abx.   Final Clinical Impressions(s) / ED Diagnoses   Final diagnoses:  None    ED Discharge Orders    None       Quintella Reichert, MD 05/30/18 [phone]

## 2018-05-29 NOTE — ED Notes (Signed)
EKG given to [facility]. For review.

## 2018-05-29 NOTE — ED Notes (Signed)
First set of blood cultures at 2142

## 2018-05-29 NOTE — ED Notes (Signed)
Second set of cultures at 2205. Right forearm

## 2018-05-30 ENCOUNTER — Inpatient Hospital Stay (HOSPITAL_COMMUNITY): Payer: Medicare HMO

## 2018-05-30 DIAGNOSIS — I5043 Acute on chronic combined systolic (congestive) and diastolic (congestive) heart failure: Secondary | ICD-10-CM | POA: Diagnosis present

## 2018-05-30 DIAGNOSIS — I2510 Atherosclerotic heart disease of native coronary artery without angina pectoris: Secondary | ICD-10-CM | POA: Diagnosis present

## 2018-05-30 DIAGNOSIS — I340 Nonrheumatic mitral (valve) insufficiency: Secondary | ICD-10-CM | POA: Diagnosis not present

## 2018-05-30 DIAGNOSIS — I5023 Acute on chronic systolic (congestive) heart failure: Secondary | ICD-10-CM | POA: Diagnosis not present

## 2018-05-30 DIAGNOSIS — E1165 Type 2 diabetes mellitus with hyperglycemia: Secondary | ICD-10-CM | POA: Diagnosis present

## 2018-05-30 DIAGNOSIS — N189 Chronic kidney disease, unspecified: Secondary | ICD-10-CM | POA: Diagnosis not present

## 2018-05-30 DIAGNOSIS — G4733 Obstructive sleep apnea (adult) (pediatric): Secondary | ICD-10-CM | POA: Diagnosis present

## 2018-05-30 DIAGNOSIS — E08649 Diabetes mellitus due to underlying condition with hypoglycemia without coma: Secondary | ICD-10-CM | POA: Diagnosis not present

## 2018-05-30 DIAGNOSIS — L03116 Cellulitis of left lower limb: Secondary | ICD-10-CM | POA: Diagnosis present

## 2018-05-30 DIAGNOSIS — N183 Chronic kidney disease, stage 3 (moderate): Secondary | ICD-10-CM | POA: Diagnosis not present

## 2018-05-30 DIAGNOSIS — A419 Sepsis, unspecified organism: Secondary | ICD-10-CM | POA: Diagnosis present

## 2018-05-30 DIAGNOSIS — I130 Hypertensive heart and chronic kidney disease with heart failure and stage 1 through stage 4 chronic kidney disease, or unspecified chronic kidney disease: Secondary | ICD-10-CM | POA: Diagnosis present

## 2018-05-30 DIAGNOSIS — I481 Persistent atrial fibrillation: Secondary | ICD-10-CM | POA: Diagnosis present

## 2018-05-30 DIAGNOSIS — E785 Hyperlipidemia, unspecified: Secondary | ICD-10-CM | POA: Diagnosis present

## 2018-05-30 DIAGNOSIS — Z950 Presence of cardiac pacemaker: Secondary | ICD-10-CM | POA: Diagnosis not present

## 2018-05-30 DIAGNOSIS — Z9049 Acquired absence of other specified parts of digestive tract: Secondary | ICD-10-CM | POA: Diagnosis not present

## 2018-05-30 DIAGNOSIS — N179 Acute kidney failure, unspecified: Secondary | ICD-10-CM | POA: Diagnosis present

## 2018-05-30 DIAGNOSIS — Z86718 Personal history of other venous thrombosis and embolism: Secondary | ICD-10-CM | POA: Diagnosis not present

## 2018-05-30 DIAGNOSIS — J9601 Acute respiratory failure with hypoxia: Secondary | ICD-10-CM | POA: Diagnosis present

## 2018-05-30 DIAGNOSIS — L271 Localized skin eruption due to drugs and medicaments taken internally: Secondary | ICD-10-CM | POA: Diagnosis not present

## 2018-05-30 DIAGNOSIS — H409 Unspecified glaucoma: Secondary | ICD-10-CM | POA: Diagnosis present

## 2018-05-30 DIAGNOSIS — I350 Nonrheumatic aortic (valve) stenosis: Secondary | ICD-10-CM | POA: Diagnosis present

## 2018-05-30 DIAGNOSIS — E1122 Type 2 diabetes mellitus with diabetic chronic kidney disease: Secondary | ICD-10-CM | POA: Diagnosis present

## 2018-05-30 DIAGNOSIS — F419 Anxiety disorder, unspecified: Secondary | ICD-10-CM | POA: Diagnosis present

## 2018-05-30 DIAGNOSIS — E11649 Type 2 diabetes mellitus with hypoglycemia without coma: Secondary | ICD-10-CM | POA: Diagnosis present

## 2018-05-30 DIAGNOSIS — E039 Hypothyroidism, unspecified: Secondary | ICD-10-CM | POA: Diagnosis present

## 2018-05-30 DIAGNOSIS — R6520 Severe sepsis without septic shock: Secondary | ICD-10-CM | POA: Diagnosis not present

## 2018-05-30 DIAGNOSIS — L0390 Cellulitis, unspecified: Secondary | ICD-10-CM | POA: Diagnosis present

## 2018-05-30 DIAGNOSIS — I480 Paroxysmal atrial fibrillation: Secondary | ICD-10-CM | POA: Diagnosis present

## 2018-05-30 DIAGNOSIS — L03115 Cellulitis of right lower limb: Secondary | ICD-10-CM | POA: Diagnosis present

## 2018-05-30 DIAGNOSIS — Z23 Encounter for immunization: Secondary | ICD-10-CM | POA: Diagnosis present

## 2018-05-30 DIAGNOSIS — F329 Major depressive disorder, single episode, unspecified: Secondary | ICD-10-CM | POA: Diagnosis present

## 2018-05-30 DIAGNOSIS — L270 Generalized skin eruption due to drugs and medicaments taken internally: Secondary | ICD-10-CM | POA: Diagnosis not present

## 2018-05-30 DIAGNOSIS — R0902 Hypoxemia: Secondary | ICD-10-CM | POA: Diagnosis not present

## 2018-05-30 DIAGNOSIS — Z87891 Personal history of nicotine dependence: Secondary | ICD-10-CM | POA: Diagnosis not present

## 2018-05-30 LAB — GLUCOSE, CAPILLARY
GLUCOSE-CAPILLARY: 338 mg/dL — AB (ref 65–99)
GLUCOSE-CAPILLARY: 310 mg/dL — AB (ref 65–99)
Glucose-Capillary: 381 mg/dL — ABNORMAL HIGH (ref 65–99)
Glucose-Capillary: 439 mg/dL — ABNORMAL HIGH (ref 65–99)

## 2018-05-30 LAB — BASIC METABOLIC PANEL
ANION GAP: 10 (ref 5–15)
Anion gap: 7 (ref 5–15)
BUN: 31 mg/dL — ABNORMAL HIGH (ref 6–20)
BUN: 35 mg/dL — ABNORMAL HIGH (ref 6–20)
CHLORIDE: 105 mmol/L (ref 101–111)
CO2: 25 mmol/L (ref 22–32)
CO2: 25 mmol/L (ref 22–32)
CREATININE: 2.16 mg/dL — AB (ref 0.61–1.24)
Calcium: 8.2 mg/dL — ABNORMAL LOW (ref 8.9–10.3)
Calcium: 8.4 mg/dL — ABNORMAL LOW (ref 8.9–10.3)
Chloride: 103 mmol/L (ref 101–111)
Creatinine, Ser: 1.9 mg/dL — ABNORMAL HIGH (ref 0.61–1.24)
GFR calc non Af Amer: 28 mL/min — ABNORMAL LOW (ref >60)
GFR, EST AFRICAN AMERICAN: 38 mL/min — AB (ref >60)
GFR, EST AFRICAN AMERICAN: 33 mL/min — AB (ref >60)
GFR, EST NON AFRICAN AMERICAN: 33 mL/min — AB (ref >60)
Glucose, Bld: 310 mg/dL — ABNORMAL HIGH (ref 65–99)
Glucose, Bld: 427 mg/dL — ABNORMAL HIGH (ref 65–99)
POTASSIUM: 3.6 mmol/L (ref 3.5–5.1)
Potassium: 3 mmol/L — ABNORMAL LOW (ref 3.5–5.1)
SODIUM: 138 mmol/L (ref 135–145)
Sodium: 137 mmol/L (ref 135–145)

## 2018-05-30 LAB — CBC
HCT: 29.5 % — ABNORMAL LOW (ref 39.0–52.0)
Hemoglobin: 8.6 g/dL — ABNORMAL LOW (ref 13.0–17.0)
MCH: 22.9 pg — AB (ref 26.0–34.0)
MCHC: 29.2 g/dL — AB (ref 30.0–36.0)
MCV: 78.5 fL (ref 78.0–100.0)
PLATELETS: 190 10*3/uL (ref 150–400)
RBC: 3.76 MIL/uL — ABNORMAL LOW (ref 4.22–5.81)
RDW: 20.2 % — ABNORMAL HIGH (ref 11.5–15.5)
WBC: 11.3 10*3/uL — ABNORMAL HIGH (ref 4.0–10.5)

## 2018-05-30 LAB — MRSA PCR SCREENING: MRSA BY PCR: POSITIVE — AB

## 2018-05-30 LAB — LACTIC ACID, PLASMA: LACTIC ACID, VENOUS: 1.3 mmol/L (ref 0.5–1.9)

## 2018-05-30 LAB — I-STAT CG4 LACTIC ACID, ED: Lactic Acid, Venous: 2.66 mmol/L (ref 0.5–1.9)

## 2018-05-30 MED ORDER — PNEUMOCOCCAL VAC POLYVALENT 25 MCG/0.5ML IJ INJ
0.5000 mL | INJECTION | INTRAMUSCULAR | Status: DC
  Filled 2018-05-30: qty 0.5

## 2018-05-30 MED ORDER — LATANOPROST 0.005 % OP SOLN
1.0000 [drp] | Freq: Every day | OPHTHALMIC | Status: DC
  Administered 2018-05-30 – 2018-06-02 (×4): 1 [drp] via OPHTHALMIC
  Filled 2018-05-30: qty 2.5

## 2018-05-30 MED ORDER — ROSUVASTATIN CALCIUM 20 MG PO TABS
20.0000 mg | ORAL_TABLET | Freq: Every evening | ORAL | Status: DC
  Administered 2018-05-30 – 2018-06-07 (×9): 20 mg via ORAL
  Filled 2018-05-30 (×9): qty 1

## 2018-05-30 MED ORDER — MUPIROCIN 2 % EX OINT
1.0000 "application " | TOPICAL_OINTMENT | Freq: Two times a day (BID) | CUTANEOUS | Status: AC
  Administered 2018-05-30 – 2018-06-03 (×10): 1 via NASAL
  Filled 2018-05-30 (×2): qty 22

## 2018-05-30 MED ORDER — CALCIUM CARBONATE ANTACID 500 MG PO CHEW: 2.0000 | CHEWABLE_TABLET | Freq: Two times a day (BID) | ORAL | Status: DC | PRN

## 2018-05-30 MED ORDER — IPRATROPIUM-ALBUTEROL 0.5-2.5 (3) MG/3ML IN SOLN: 3.0000 mL | Freq: Four times a day (QID) | RESPIRATORY_TRACT | Status: DC

## 2018-05-30 MED ORDER — POTASSIUM CHLORIDE CRYS ER 20 MEQ PO TBCR
40.0000 meq | EXTENDED_RELEASE_TABLET | Freq: Once | ORAL | Status: AC
  Administered 2018-05-30: 40 meq via ORAL
  Filled 2018-05-30: qty 2

## 2018-05-30 MED ORDER — APIXABAN 2.5 MG PO TABS
2.5000 mg | ORAL_TABLET | Freq: Two times a day (BID) | ORAL | Status: DC
  Administered 2018-05-30 – 2018-06-01 (×5): 2.5 mg via ORAL
  Filled 2018-05-30 (×5): qty 1

## 2018-05-30 MED ORDER — INSULIN ASPART 100 UNIT/ML ~~LOC~~ SOLN
0.0000 [IU] | Freq: Three times a day (TID) | SUBCUTANEOUS | Status: DC
  Administered 2018-05-30 (×2): 15 [IU] via SUBCUTANEOUS
  Administered 2018-05-30: 11 [IU] via SUBCUTANEOUS
  Administered 2018-05-31 (×2): 15 [IU] via SUBCUTANEOUS
  Administered 2018-05-31: 8 [IU] via SUBCUTANEOUS
  Administered 2018-06-01 (×2): 3 [IU] via SUBCUTANEOUS
  Administered 2018-06-01: 2 [IU] via SUBCUTANEOUS

## 2018-05-30 MED ORDER — PIPERACILLIN-TAZOBACTAM 3.375 G IVPB
3.3750 g | Freq: Three times a day (TID) | INTRAVENOUS | Status: DC
  Administered 2018-05-30 – 2018-05-31 (×4): 3.375 g via INTRAVENOUS
  Filled 2018-05-30 (×3): qty 50

## 2018-05-30 MED ORDER — BRIMONIDINE TARTRATE 0.15 % OP SOLN
1.0000 [drp] | Freq: Three times a day (TID) | OPHTHALMIC | Status: DC
  Administered 2018-05-30 – 2018-06-08 (×28): 1 [drp] via OPHTHALMIC
  Filled 2018-05-30: qty 5

## 2018-05-30 MED ORDER — FUROSEMIDE 10 MG/ML IJ SOLN
20.0000 mg | Freq: Every day | INTRAMUSCULAR | Status: DC
  Administered 2018-05-30 – 2018-05-31 (×2): 20 mg via INTRAVENOUS
  Filled 2018-05-30 (×2): qty 2

## 2018-05-30 MED ORDER — LEVOTHYROXINE SODIUM 50 MCG PO TABS
75.0000 ug | ORAL_TABLET | Freq: Every day | ORAL | Status: DC
  Administered 2018-05-30 – 2018-06-08 (×10): 75 ug via ORAL
  Filled 2018-05-30 (×10): qty 1

## 2018-05-30 MED ORDER — MOMETASONE FURO-FORMOTEROL FUM 100-5 MCG/ACT IN AERO
2.0000 | INHALATION_SPRAY | Freq: Two times a day (BID) | RESPIRATORY_TRACT | Status: DC
  Administered 2018-05-30 – 2018-06-08 (×19): 2 via RESPIRATORY_TRACT
  Filled 2018-05-30 (×2): qty 8.8

## 2018-05-30 MED ORDER — IPRATROPIUM-ALBUTEROL 0.5-2.5 (3) MG/3ML IN SOLN
3.0000 mL | RESPIRATORY_TRACT | Status: DC | PRN
  Administered 2018-05-31 (×2): 3 mL via RESPIRATORY_TRACT
  Filled 2018-05-30 (×2): qty 3

## 2018-05-30 MED ORDER — INSULIN ASPART 100 UNIT/ML ~~LOC~~ SOLN
40.0000 [IU] | Freq: Three times a day (TID) | SUBCUTANEOUS | Status: DC
  Administered 2018-05-30 – 2018-05-31 (×3): 40 [IU] via SUBCUTANEOUS

## 2018-05-30 MED ORDER — VANCOMYCIN HCL IN DEXTROSE 1-5 GM/200ML-% IV SOLN
1000.0000 mg | INTRAVENOUS | Status: DC
  Administered 2018-05-31 – 2018-06-04 (×5): 1000 mg via INTRAVENOUS
  Filled 2018-05-30 (×6): qty 200

## 2018-05-30 MED ORDER — IPRATROPIUM-ALBUTEROL 0.5-2.5 (3) MG/3ML IN SOLN: 3.0000 mL | Freq: Four times a day (QID) | RESPIRATORY_TRACT | Status: DC | PRN

## 2018-05-30 MED ORDER — LATANOPROST 0.005 % OP SOLN
1.0000 [drp] | Freq: Every day | OPHTHALMIC | Status: DC
  Filled 2018-05-30: qty 2.5

## 2018-05-30 MED ORDER — OMEGA-3-ACID ETHYL ESTERS 1 G PO CAPS
1000.0000 mg | ORAL_CAPSULE | Freq: Every day | ORAL | Status: DC
  Administered 2018-05-30 – 2018-06-01 (×3): 1000 mg via ORAL
  Filled 2018-05-30 (×3): qty 1

## 2018-05-30 MED ORDER — CHLORHEXIDINE GLUCONATE CLOTH 2 % EX PADS
6.0000 | MEDICATED_PAD | Freq: Every day | CUTANEOUS | Status: AC
  Administered 2018-05-30 – 2018-06-03 (×3): 6 via TOPICAL

## 2018-05-30 MED ORDER — FUROSEMIDE 10 MG/ML IJ SOLN
40.0000 mg | Freq: Once | INTRAMUSCULAR | Status: AC
  Administered 2018-05-30: 40 mg via INTRAVENOUS
  Filled 2018-05-30: qty 4

## 2018-05-30 MED ORDER — GUAIFENESIN ER 600 MG PO TB12
600.0000 mg | ORAL_TABLET | Freq: Two times a day (BID) | ORAL | Status: DC
  Administered 2018-05-30 – 2018-06-08 (×19): 600 mg via ORAL
  Filled 2018-05-30 (×19): qty 1

## 2018-05-30 MED ORDER — DORZOLAMIDE HCL 2 % OP SOLN
1.0000 [drp] | Freq: Two times a day (BID) | OPHTHALMIC | Status: DC
  Administered 2018-05-30 – 2018-06-08 (×19): 1 [drp] via OPHTHALMIC
  Filled 2018-05-30: qty 10

## 2018-05-30 MED ORDER — LEVOTHYROXINE SODIUM 75 MCG PO TABS: 75.0000 ug | ORAL_TABLET | Freq: Every day | ORAL | Status: DC

## 2018-05-30 MED ORDER — FLUTICASONE PROPIONATE 50 MCG/ACT NA SUSP
2.0000 | Freq: Every day | NASAL | Status: DC
  Administered 2018-05-30 – 2018-06-08 (×10): 2 via NASAL
  Filled 2018-05-30: qty 16

## 2018-05-30 MED ORDER — GUAIFENESIN 200 MG PO TABS: 400.0000 mg | ORAL_TABLET | Freq: Two times a day (BID) | ORAL | Status: DC

## 2018-05-30 MED ORDER — POTASSIUM CHLORIDE CRYS ER 20 MEQ PO TBCR
20.0000 meq | EXTENDED_RELEASE_TABLET | Freq: Every day | ORAL | Status: DC
  Administered 2018-05-30 – 2018-06-08 (×10): 20 meq via ORAL
  Filled 2018-05-30 (×10): qty 1

## 2018-05-30 MED ORDER — NETARSUDIL DIMESYLATE 0.02 % OP SOLN
1.0000 [drp] | Freq: Every evening | OPHTHALMIC | Status: DC
  Administered 2018-05-31 – 2018-06-02 (×3): 1 [drp] via OPHTHALMIC

## 2018-05-30 MED ORDER — ASPIRIN EC 81 MG PO TBEC
81.0000 mg | DELAYED_RELEASE_TABLET | Freq: Every day | ORAL | Status: DC
  Administered 2018-05-31 – 2018-06-01 (×2): 81 mg via ORAL
  Filled 2018-05-30 (×3): qty 1

## 2018-05-30 MED ORDER — AMIODARONE HCL 200 MG PO TABS
200.0000 mg | ORAL_TABLET | Freq: Every day | ORAL | Status: DC
  Administered 2018-05-30 – 2018-06-08 (×10): 200 mg via ORAL
  Filled 2018-05-30 (×10): qty 1

## 2018-05-30 MED ORDER — RISAQUAD PO CAPS
1.0000 | ORAL_CAPSULE | Freq: Every evening | ORAL | Status: DC
  Administered 2018-05-30 – 2018-06-07 (×9): 1 via ORAL
  Filled 2018-05-30 (×9): qty 1

## 2018-05-30 MED ORDER — TIMOLOL MALEATE 0.5 % OP SOLN
1.0000 [drp] | Freq: Two times a day (BID) | OPHTHALMIC | Status: DC
  Administered 2018-05-30 – 2018-06-08 (×19): 1 [drp] via OPHTHALMIC
  Filled 2018-05-30: qty 5

## 2018-05-30 MED ORDER — VANCOMYCIN HCL IN DEXTROSE 1-5 GM/200ML-% IV SOLN: 1000.0000 mg | Freq: Two times a day (BID) | INTRAVENOUS | Status: DC

## 2018-05-30 MED ORDER — PIPERACILLIN-TAZOBACTAM 3.375 G IVPB 30 MIN: 3.3750 g | Freq: Four times a day (QID) | INTRAVENOUS | Status: DC

## 2018-05-30 MED ORDER — TRAMADOL HCL 50 MG PO TABS
50.0000 mg | ORAL_TABLET | Freq: Two times a day (BID) | ORAL | Status: DC | PRN
  Administered 2018-05-30 – 2018-06-02 (×2): 50 mg via ORAL
  Filled 2018-05-30 (×2): qty 1

## 2018-05-30 NOTE — Progress Notes (Signed)
PT Cancellation Note  Patient Details Name: [name] MRN: [id number] DOB: Nov 09, 1941   Cancelled Treatment:     Pt just returned to bed from recliner by nursing using the lift. Pt stayed up for a while after OT saw him and used stedy to get him to the recliner. He politely refused the PT evaluation due to being so tired at this time. Agreed to Evaluation tomorrow.    Clide Dales 05/30/2018, 3:47 PM  Clide Dales, PT Pager: [phone] 05/30/2018

## 2018-05-30 NOTE — H&P (Signed)
History and Physical  [name] DPO:[id number] DOB: [birth year] DOA: 05/29/2018 2102  Referring physician: n/a PCP: Renato Shin, MD  Outpatient Specialists:  Scharlene Gloss (ID); Allred, J (cardiology [facility])  Chief Complaint: weakness, malaise, mechanical fall, and progressive R leg swelling/redness  HPI: [name] is a 77 y.o. male with CHF (EF45-50%), pAF and hx of SSS s/p PPM (pacer-dependent) on eliquis, COPD, DMT2, CKD stage 2-3, and GBS bacteremia in January 2019 and lower extremity cellulitis who presented by EMS after mechanical fall while trying to ambulate to the bathroom. No syncope or loss of consciousness. He has noted increasing right leg warmth and tenderness for the past 2-3 weeks and new blisters that would "pop" with yellowish drainage. For the past week, he has also felt increasing malaise and weakness. Mild increase in dyspnea from baseline. Does not require home O2 at baseline. Lives with wife at home.  Regarding group B strep bacteremia in January 2019: suspected source was lower extremity cellulitis. He has since completed PCN --> ceftriaxone via PICC. TEE was recommended to rule out endocarditis or PPM infection; however team decided against given risk of respiratory decompensation.   ED course:  BP 106/70 T 100.3; HR 74; SO2 99% on RA  Review of Systems:  + weakness, chills, lower extremity erythema and swelling + increased dyspnea on exertion - no cough; - no chest pain - denies PND, orthopnea - no nausea/vomiting; no tarry, melanotic or bloody stools - no dysuria, increased urinary frequency - no weight changes  Rest of systems reviewed are negative, except as per above history.   Past Medical History:  Diagnosis Date  . Anemia    hx of  . Anxiety   . CAD (coronary artery disease)   . Cancer [facility]) 2014   bladder cancer AND RIGHT URETERAL CANCER  . COPD (chronic obstructive pulmonary disease) (HCC)    CPAP  . Depression   . Diabetes mellitus    TypeII  . Diastolic dysfunction with chronic heart failure ([place])   . Gait difficulty    slow gait"ambulates with walker"  . GERD (gastroesophageal reflux disease)   . 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 H/O Legionnaire's disease 2003  . History of blood clots    R groin  . History of kidney stones   . Hyperkalemia   . Hyperlipemia   . Hypertensive cardiovascular-renal disease   . Hypothyroidism   . Morbid obesity ([place])   . OSA on CPAP    using CPAP although it is hard for him to tolerate  . Osteoarthritis    fingers  . Pancreatitis   . Peripheral neuropathy   . Persistent atrial fibrillation ([place])   . Pneumonia 2003  . Renal insufficiency   . Shortness of breath dyspnea    with exertion  . Sick sinus syndrome ([place]) 06/2010   s/p PPM by [employer]  . Venous insufficiency    Past Surgical History:  Procedure Laterality Date  . BELPHAROPTOSIS REPAIR Right    Glaucoma  . CARDIAC CATHETERIZATION     11-02-13  . CARDIOVERSION N/A 05/16/2014   Procedure: CARDIOVERSION;  Surgeon: Josue Hector, MD;  Location[facility]COPY;  Service: Cardiovascular;  Laterality: N/A;  . CARDIOVERSION N/A 11/08/2014   Procedure: CARDIOVERSION;  Surgeon: Fay Records, MD;  Location: [place] ;  Service: Cardiovascular;  Laterality: N/A;  . CARDIOVERSION N/A 06/22/2015   Procedure: CARDIOVERSION;  Surgeon: Jerline Pain,  MD;  Location: [place];  Service: Cardiovascular;  Laterality: N/A;  . CHOLECYSTECTOMY  2012  . CYSTOSCOPY W/ URETERAL STENT PLACEMENT Right 10/26/2013   Procedure: CYSTOSCOPY WITH RETROGRADE PYELOGRAM/URETERAL STENT PLACEMENT;  Surgeon: Alexis Frock, MD;  Location: [facility];  Service: Urology;  Laterality: Right;  . CYSTOSCOPY WITH URETEROSCOPY AND STENT PLACEMENT Right 01/11/2014   Procedure: CYSTOSCOPY WITH URETEROSCOPY ,RIGHT RETROGRADE AND STENT CHANGE AND LASER OF URETERAL TUMOR;  Surgeon: Alexis Frock, MD;  Location: [facility];  Service: Urology;  Laterality:  Right;  . CYSTOSCOPY/RETROGRADE/URETEROSCOPY Right 08/23/2014   Procedure: CYSTOSCOPY/RETROGRADE/ DIAGNOSTIC URETEROSCOPY/RIGHT RENAL STONE EXTRACTION;  Surgeon: Alexis Frock, MD;  Location: [facility];  Service: Urology;  Laterality: Right;  . EMBOLECTOMY Left 11/02/2013   Procedure: LEFT FEMORAL EMBOLECTOMY, LEFT FEMORAL ARTERY ENDARTERECTOMY WITH DACRON PATCH ANGIOPLASTY.;  Surgeon: Mal Misty, MD;  Location: [place];  Service: Vascular;  Laterality: Left;  . EYE SURGERY Left    cataract  . GROIN DEBRIDEMENT Right 05/08/2015   Procedure: REMOVAL OF RIGHT GROIN MASS;  Surgeon: Angelia Mould, MD;  Location: [place];  Service: Vascular;  Laterality: Right;  . HOLMIUM LASER APPLICATION Right 12/13/1094   Procedure: HOLMIUM LASER APPLICATION;  Surgeon: Alexis Frock, MD;  Location: [facility];  Service: Urology;  Laterality: Right;  . INSERT / REPLACE / REMOVE PACEMAKER  2011  . NASAL SEPTUM SURGERY  1967  . PACEMAKER PLACEMENT  06/2010   [facility] Accent RF DR, Model [id number] ( Serial number [id number])  . TEE WITHOUT CARDIOVERSION N/A 05/16/2014   Procedure: TRANSESOPHAGEAL ECHOCARDIOGRAM (TEE);  Surgeon: Josue Hector, MD;  Location: [facility] ENDOSCOPY;  Service: Cardiovascular;  Laterality: N/A;  . thromboembolectomy and four compartment fasciotomy Right 2009   leg  . TRANSURETHRAL RESECTION OF BLADDER TUMOR WITH GYRUS (TURBT-GYRUS) N/A 10/26/2013   Procedure: TRANSURETHRAL RESECTION OF BLADDER TUMOR WITH GYRUS (TURBT-GYRUS);  Surgeon: Alexis Frock, MD;  Location[facility];  Service: Urology;  Laterality: N/A;  . TRANSURETHRAL RESECTION OF BLADDER TUMOR WITH GYRUS (TURBT-GYRUS) N/A 01/11/2014   Procedure: TRANSURETHRAL RESECTION OF BLADDER TUMOR WITH GYRUS (TURBT-GYRUS);  Surgeon: Alexis Frock, MD;  Location[facility];  Service: Urology;  Laterality: N/A;  . WISDOM TOOTH EXTRACTION     Social History:  reports that he quit smoking about 11 years ago. His smoking use included cigarettes. He has a  100.00 pack-year smoking history. He has never used smokeless tobacco. He reports that he does not drink alcohol or use drugs.  Allergies  Allergen Reactions  . Actos [Pioglitazone] Swelling  . Timolol Other (See Comments)    Slow heart rate but tolerates Cosopt (dorzolamide-timolol)    Family History  Problem Relation Age of Onset  . Liver cancer Mother        deceased age 55  . Cancer Mother        liver cancer  . Heart attack Father   . Colon cancer Neg Hx      Prior to Admission medications   Medication Sig Start Date End Date Taking? Authorizing Provider  acetaminophen (TYLENOL) 500 MG tablet Take 1,000 mg by mouth 2 (two) times daily as needed (pain/headache).     [provider]  albuterol (VENTOLIN HFA) 108 (90 Base) MCG/ACT inhaler Inhale 2 puffs into the lungs every 6 (six) hours as needed for wheezing or shortness of breath. 11/23/17   Parrett, Fonnie Mu, NP  amiodarone (PACERONE) 200 MG tablet Take 1 tablet (200 mg total) by mouth daily. 01/06/18  Burtis Junes, NP  apixaban (ELIQUIS) 5 MG TABS tablet Take 1 tablet (5 mg total) by mouth 2 (two) times daily. 01/06/18   Burtis Junes, NP  Ascorbic Acid (VITAMIN C) 500 MG tablet Take 500 mg by mouth daily.     [provider]  brimonidine (ALPHAGAN P) 0.1 % SOLN Place 1 drop into the left eye 3 (three) times daily.     [provider]  budesonide-formoterol (SYMBICORT) 80-4.5 MCG/ACT inhaler Inhale 2 puffs into the lungs 2 (two) times daily. Patient not taking: Reported on 05/05/2018 11/23/17   Parrett, Fonnie Mu, NP  calcium carbonate (TUMS - DOSED IN MG ELEMENTAL CALCIUM) 500 MG chewable tablet Chew 2 tablets by mouth 2 (two) times daily as needed for indigestion or heartburn.    [provider]  cholecalciferol (VITAMIN D) 1000 UNITS tablet Take 1,000 Units by mouth 2 (two) times daily.     [provider]  CINNAMON PO Take 2,000 mg by mouth daily.    [provider]    dextromethorphan (DELSYM) 30 MG/5ML liquid Take 90 mg by mouth 2 (two) times daily as needed for cough.    [provider]  Digestive Enzymes (MULTI-ENZYME) TABS Take 1 tablet by mouth daily.    [provider]  docusate sodium (COLACE) 100 MG capsule Take 100-200 mg by mouth daily as needed for mild constipation.     [provider]  dorzolamide (TRUSOPT) 2 % ophthalmic solution Place 1 drop into both eyes 2 (two) times daily.    [provider]  fluticasone (FLONASE) 50 MCG/ACT nasal spray Place 2 sprays into both nostrils daily.    [provider]  furosemide (LASIX) 40 MG tablet Take 1 tablet (40 mg total) by mouth 2 (two) times daily. 01/06/18   Burtis Junes, NP  guaiFENesin (MUCINEX) 600 MG 12 hr tablet Take 600 mg by mouth 2 (two) times daily as needed for cough or to loosen phlegm.    [provider]  insulin regular (NOVOLIN R) 100 units/mL injection 4 times a day (just before each meal), 300-210-210-190 units 03/10/18   Renato Shin, MD  Lactobacillus (ACIDOPHILUS) CAPS capsule Take 1 capsule by mouth every evening.     [provider]  levothyroxine (SYNTHROID, LEVOTHROID) 75 MCG tablet Take 1 tablet (75 mcg total) by mouth daily before breakfast. 01/27/18   Renato Shin, MD  metoprolol tartrate (LOPRESSOR) 25 MG tablet Take 1 tablet (25 mg total) by mouth 2 (two) times daily. 01/06/18   Burtis Junes, NP  Multiple Vitamin (MULTIVITAMIN WITH MINERALS) TABS tablet Take 1 tablet by mouth every evening.     [provider]  Netarsudil Dimesylate (RHOPRESSA) 0.02 % SOLN Place 1 drop into the left eye daily.    [provider]  Omega-3 Fatty Acids (FISH OIL) 1000 MG CAPS Take 1,000 mg by mouth daily.     [provider]  omeprazole (PRILOSEC) 40 MG capsule Take 1 capsule (40 mg total) by mouth daily. 01/27/18   Renato Shin, MD  Resveratrol 100 MG CAPS Take 100 mg by mouth every evening.    [provider]  rosuvastatin (CRESTOR) 40 MG tablet Take 0.5 tablets (20 mg total) by mouth every evening. 01/27/18   Renato Shin, MD  Timolol Maleate 0.5 % (DAILY) SOLN Place 1 drop into both eyes 2 (two) times daily.    [provider]  traMADol (ULTRAM) 50 MG tablet TAKE 2 TABLETS BY MOUTH  TWICE A DAY AS NEEDED FOR PAIN 03/08/18   Renato Shin, MD  travoprost, benzalkonium, (TRAVATAN) 0.004 % ophthalmic solution Place 1 drop into the left eye at bedtime.    [provider]    Temp:  [100.3 F (37.9 C)] 100.3 F (37.9 C) (06/22 2126) Pulse Rate:  [70-78] 74 (06/22 2330) Resp:  [10-20] 11 (06/22 2330) BP: (94-113)/(47-70) 104/47 (06/22 2330) SpO2:  [99 %-100 %] 100 % (06/22 2330) Weight:  [141.5 kg (312 lb)] 141.5 kg (312 lb) (06/22 2136)  PHYSICAL EXAM:  BP (!) 104/47   Pulse 74   Temp 100.3 F (37.9 C) (Oral)   Resp 11   Ht 5\' 10"  (1.778 m)   Wt (!) 141.5 kg (312 lb)   SpO2 100%   BMI 44.77 kg/m   GEN morbidly obese elderly caucasian male; sitting upright in bed appears in mild distress  HEENT NCAT EOM PERRL; clear oropharynx, no cervical LAD; moist mucus membranes  JVP estimated 10 cm H2O above RA; + HJR ; no carotid bruits b/l ;  CV regular normal rate;  mid systolic cresc-decr systolic murmur loudest at USB and radiating throughout precordium with preserved S2; +S3; PMI non palpable; no parasternal heave  RESP  Bibasilar crackles; +accessory mm use and breathing mildly labored; symmetric  ABD soft obese NT ND +normoactive BS  EXT cool on LLE to mid shins; warm on RLE; +3 pitting edema to mid shins  PULSES  DP on L foot 1+; DP on R foot 2+; radials 2+ b/l SKIN/MSK  Redness over RLE extending to medial R thigh; tender and indurated. Chronic venous stasis changes bilateral shins but right leg has more pronounced erythema and tender to palpation, yellow crusted blister on anterior R shin (crusted over); chronic venous stasis on LLE and noticeably cooler to  touch; note L foot appears dusky NEURO/PSYCH no focal deficits   LABS ON ADMISSION:  Basic Metabolic Panel: Recent Labs  Lab 05/29/18 2143 05/29/18 2204  NA 136 136  K 4.2 4.0  CL 100* 98*  CO2 25  --   GLUCOSE 289* 287*  BUN 33* 31*  CREATININE 1.95* 1.80*  CALCIUM 8.7*  --    CBC: Recent Labs  Lab 05/29/18 2143 05/29/18 2204  WBC 12.8*  --   NEUTROABS 10.5*  --   HGB 10.0* 9.5*  HCT 32.9* 28.0*  MCV 77.0*  --   PLT 220  --    Liver Function Tests: Recent Labs  Lab 05/29/18 2143  AST 60*  ALT 15*  ALKPHOS 92  BILITOT 1.1  PROT 7.6  ALBUMIN 3.2*   No results for input(s): LIPASE, AMYLASE in the last 168 hours. No results for input(s): AMMONIA in the last 168 hours. Coagulation:  Lab Results  Component Value Date   INR 1.33 07/28/2017   INR 1.2 (H) 05/29/2015   INR 1.07 05/08/2015   No results found for: PTT Lactic Acid, Venous:     Component Value Date/Time   LATICACIDVEN 4.56 (HH) 05/29/2018 2151   Cardiac Enzymes: No results for input(s): CKTOTAL, CKMB, CKMBINDEX, TROPONINI in the last 168 hours.  BNP (last 3 results) Recent Labs    10/26/17 1425  PROBNP 1,148*   CBG: No results for input(s): GLUCAP in the last 168 hours.  Radiological Exams on Admission: Dg Chest 2 View  Result Date: 05/29/2018 CLINICAL DATA:  Nausea, fever, shortness of breath, ex-smoker. EXAM: CHEST - 2 VIEW COMPARISON:  12/30/2017. FINDINGS: Enlarged cardiac silhouette with an  interval decrease in size. Mildly prominent pulmonary vasculature. Clear lungs with resolved prominence of the interstitial markings. The lungs are mildly hyperexpanded with mild peribronchial thickening. Stable left subclavian pacemaker leads. Thoracic spine degenerative changes. IMPRESSION: 1. Mild cardiomegaly with improvement. 2. Mild pulmonary vascular congestion. 3. Resolved interstitial pulmonary edema. 4. Mild changes of COPD and chronic bronchitis. Electronically Signed   By: Claudie Revering  M.D.   On: 05/29/2018 23:01   Ct Head Wo Contrast  Result Date: 05/29/2018 CLINICAL DATA:  [address] and hit his head. EXAM: CT HEAD WITHOUT CONTRAST TECHNIQUE: Contiguous axial images were obtained from the base of the skull through the vertex without intravenous contrast. COMPARISON:  04/26/2008. FINDINGS: Brain: Diffusely enlarged ventricles and subarachnoid spaces. Patchy white matter low density in both cerebral hemispheres. No intracranial hemorrhage, mass lesion or CT evidence of acute infarction. Vascular: No hyperdense vessel or unexpected calcification. Skull: Normal. Negative for fracture or focal lesion. Sinuses/Orbits: Unremarkable paranasal sinuses. Status post bilateral cataract extraction and right globe band. Other: None. IMPRESSION: 1. No acute abnormality. 2. Mild diffuse cerebral and cerebellar atrophy. 3. Minimal chronic small vessel white matter ischemic changes in both cerebral hemispheres. Electronically Signed   By: Claudie Revering M.D.   On: 05/29/2018 23:10    EKG: Independently reviewed. V-paced at 70 bpm with pacing induced LBB  Assessment/Plan [name] is a 77 y.o. male with hx of COPD, CHF, pAF and SSS s/p PPM and on NOAC and hx of GBS bacteremia(?source lower extremity cellulitis), CKD who presents with malaise and mechanical fall. Exam notable for borderline fever and erythematous and warm RLE. Also appears volume up. Mild leukocytosis. Will treat for mod-severe cellulitis, possible sepsis, and also attempt gentle diuresis. Will repeat TTE to rule out large vegetation; of note, inpatient team decided against TEE during last hospitalization for bacteremia given resp risk.   Active Problems:   Cellulitis   # Lower extremity cellulitis, concern for sepsis. Also chronic venous stasis.  > prior GBS bacteremia hx in January - vanc and zosyn IV (D1 05/30/18) - blood cultures pending - if cx positive, will need ID consult - gentle diuresis  # Volume overload, history of  diastolic CHF although EF mildly decreased 45%.  - hold off beta-blocker given borderline BP and concurrent infection - lasix 40mg  IV x 1 now - repeat TTE ordered - net neg goal 500cc to 1L (caution given concurrent infection) - likely will need bolus IV diuresis tomorrow and then can resume PO lasix pending BP and renal function - strict ins/outs and fluid restriction - duonebs prn   # pAF and SSS s/p PPM - currently v-paced. On NOAC at home - resume eliquis at renal dosing 2.5mg  BID - no beta blocker as above.   # Hypothyroidism - resume home synthroid  # GERD - resume prn tums  # Aortic stenosis - graded moderate on last echo - given suspected active infection and no clear sx due to AS, no indication for urgent valve replacement  # DMT2 on mealtime insulin at home. No long acting due to hypoglycemia episodes - SSI ACHS, carb controlled diet   DVT Prophylaxis: on eliquis Code Status:  DNR Family Communication: none (wife on her way home)  Disposition Plan: admission for cellulitis and sepsis ruleout Extended Emergency Contact Information Primary Emergency Contact: Monestime,Linda Address: [place]          [place],  [address] [place] Phone: [phone] Mobile Phone: [phone]  Relation: Spouse Secondary Emergency Contact: Pirie,Craig  [place] Mobile Phone: [phone] Relation: Son  Time spent: > 50 minutes  Colbert Ewing, MD [facility] Pager [phone]  If 7PM-7AM, please contact night-coverage [email or web address] Password [facility] 05/30/2018, 12:06 AM

## 2018-05-30 NOTE — Evaluation (Signed)
Occupational Therapy Evaluation Patient Details Name: [name] MRN: [id number] DOB: Jan 05, 1941 Today's Date: 05/30/2018    History of Present Illness This 77 y.o. male admitted after falling in bathroom.  Dx:  likely sepsis due to bil. LE cellulitis, volume overload due to diastolic HF with EF 19%;.  PMH aortic stensosi; DM, morbid obesity, sick sinus syndrom s/p PPM; A-Fib, peripheral neuropathy, renal disease, glaucoma, COPD    Clinical Impression   Pt admitted with above. He demonstrates the below listed deficits and will benefit from continued OT to maximize safety and independence with BADLs.  Pt presents to OT with generalized weakness, decreased activity tolerance, impaired balance, and obesity that affect his ability to perform ADLs.  He currently requires min - total  A for ADLs, and min A for transfers using stedy.  He required mod A for LB ADLs only PTA, and was able to ambulate mod I with RW.   He lives with wife, who is supportive, but unable to provide necessary level of care he currently needs.  Recommend SNF.          Follow Up Recommendations  SNF;Supervision - Intermittent    Equipment Recommendations  None recommended by OT    Recommendations for Other Services       Precautions / Restrictions Precautions Precautions: Fall Restrictions Weight Bearing Restrictions: No      Mobility Bed Mobility Overal bed mobility: Needs Assistance Bed Mobility: Supine to Sit     Supine to sit: HOB elevated;Min assist     General bed mobility comments: assist to lift trunk   Transfers Overall transfer level: Needs assistance   Transfers: Sit to/from Stand;Stand Pivot Transfers Sit to Stand: Min assist Stand pivot transfers: Min assist       General transfer comment: assist to boost into standing and to steady.   Required use of Stedy.  Very fearful of moving     Balance Overall balance assessment: Needs assistance Sitting-balance support: Feet  supported Sitting balance-Leahy Scale: Fair     Standing balance support: Bilateral upper extremity supported Standing balance-Leahy Scale: Poor Standing balance comment: requires bil. UE support                            ADL either performed or assessed with clinical judgement   ADL Overall ADL's : Needs assistance/impaired Eating/Feeding: Independent   Grooming: Wash/dry hands;Wash/dry face;Oral care;Brushing hair;Set up;Sitting   Upper Body Bathing: Minimal assistance;Sitting   Lower Body Bathing: Maximal assistance;Sit to/from stand   Upper Body Dressing : Minimal assistance;Sitting   Lower Body Dressing: Total assistance;Sit to/from stand   Toilet Transfer: Minimal assistance;Stand-pivot;BSC(using Stedy )   Toileting- Clothing Manipulation and Hygiene: Total assistance;Sit to/from stand       Functional mobility during ADLs: Minimal assistance(stedy )       Vision Baseline Vision/History: Wears glasses Wears Glasses: At all times Patient Visual Report: No change from baseline       Perception     Praxis      Pertinent Vitals/Pain Pain Assessment: Faces Faces Pain Scale: Hurts a little bit Pain Location: generalized  Pain Descriptors / Indicators: Moaning Pain Intervention(s): Monitored during session     Hand Dominance Left   Extremity/Trunk Assessment Upper Extremity Assessment Upper Extremity Assessment: Generalized weakness   Lower Extremity Assessment Lower Extremity Assessment: Defer to PT evaluation       Communication Communication Communication: HOH   Cognition Arousal/Alertness: Awake/alert Behavior During  Therapy: WFL for tasks assessed/performed Overall Cognitive Status: Within Functional Limits for tasks assessed                                     General Comments  VSS.   wife present.  He is agreeable to SNF level rehab     Exercises     Shoulder Instructions      Home Living Family/patient  expects to be discharged to:: Private residence Living Arrangements: Spouse/significant other Available Help at Discharge: Family;Available 24 hours/day Type of Home: House Home Access: Stairs to enter [employer] of Steps: 4 Entrance Stairs-Rails: Right Home Layout: One level     Bathroom Shower/Tub: Occupational psychologist: Standard     Home Equipment: Environmental consultant - 2 wheels;Walker - 4 wheels;Cane - single point;Shower seat;Hand held shower head   Additional Comments: sleeps in a recliner      Prior Functioning/Environment Level of Independence: Needs assistance  Gait / Transfers Assistance Needed: uses a cane and furniture inside and RW out ADL's / Homemaking Assistance Needed: wife helps with LB bathing and dressing, sits to shower            OT Problem List: Decreased strength;Decreased activity tolerance;Impaired balance (sitting and/or standing);Decreased knowledge of use of DME or AE;Obesity      OT Treatment/Interventions: Self-care/ADL training;Therapeutic exercise;DME and/or AE instruction;Therapeutic activities;Patient/family education;Balance training    OT Goals(Current goals can be found in the care plan section) Acute Rehab OT Goals Patient Stated Goal: to get stronger and be able take care of self  OT Goal Formulation: With patient/family Time For Goal Achievement: 06/13/18 Potential to Achieve Goals: Good ADL Goals Pt Will Perform Upper Body Bathing: with set-up;sitting Pt Will Perform Lower Body Bathing: with mod assist;with adaptive equipment;sit to/from stand Pt Will Perform Upper Body Dressing: with set-up;sitting Pt Will Perform Lower Body Dressing: with mod assist;sit to/from stand;with adaptive equipment Pt Will Transfer to Toilet: with min assist;ambulating;regular height toilet;bedside commode;grab bars Pt Will Perform Toileting - Clothing Manipulation and hygiene: with mod assist;sit to/from stand  OT Frequency: Min 2X/week    Barriers to D/C: Decreased caregiver support          Co-evaluation              AM-PAC PT "6 Clicks" Daily Activity     Outcome Measure Help from another person eating meals?: None Help from another person taking care of personal grooming?: A Little Help from another person toileting, which includes using toliet, bedpan, or urinal?: Total Help from another person bathing (including washing, rinsing, drying)?: A Lot Help from another person to put on and taking off regular upper body clothing?: A Little Help from another person to put on and taking off regular lower body clothing?: Total 6 Click Score: 14   End of Session Equipment Utilized During Treatment: Other (comment)(stedy ) Nurse Communication: Mobility status;Need for lift equipment  Activity Tolerance: Patient limited by fatigue Patient left: in chair;with call bell/phone within reach;with family/visitor present  OT Visit Diagnosis: Unsteadiness on feet (R26.81)                Time: 2703-5009 OT Time Calculation (min): 28 min Charges:  OT General Charges $OT Visit: 1 Visit OT Evaluation $OT Eval Moderate Complexity: 1 Mod OT Treatments $Therapeutic Activity: 8-22 mins G-Codes:     [facility], OTR/L [phone]   Ladonna Snide, Aibhlinn Kalmar M  05/30/2018, 10:52 AM

## 2018-05-30 NOTE — Progress Notes (Signed)
77 yo male history of CHF ejection fraction 45 to 50%, history of recurrent lower extremity cellulitis due to chronic edema, type 2 diabetes, CKD stage III, paroxysmal atrial fibrillation on Eliquis admitted this morning with b/l lower extremity cellulitis.  Patient reports he does have chronic edema to his both lower extremities but it is worse than usual.  He is admitted for lower extremity cellulitis placed on vancomycin and Zosyn for pharmacy.  His main complaint today is cough and shortness of breath.  He does take Lasix 40 mg twice a day at home he received a dose of Lasix last night.  I will restart his Lasix 20 mg IV daily.  Follow-up BMP and CBC daily.  Restart home medications restart Mucinex.  Recent group B strep bacteremia in January 2019.

## 2018-05-30 NOTE — Progress Notes (Signed)
Unable to perform echo.  Will attempt tomorrow.

## 2018-05-30 NOTE — Progress Notes (Signed)
Pt's blood sugar is 430,  Notified Dr Zigmund Daniel and ordered stat BMP to verify. Waiting for new orders

## 2018-05-30 NOTE — Progress Notes (Signed)
Pharmacy Antibiotic Note  [name] is a 77 y.o. male admitted on 05/29/2018 with cellulitis.  Pharmacy has been consulted for Vancomycin and Zosyn dosing.  Plan: Zosyn 3.375g IV q8h (4 hour infusion).   Vancomycin 2gm iv x1, then 1gm iv q24hr   Goal AUC = 400 - 500 for all indications, except meningitis (goal AUC > 500 and Cmin 15-20 mcg/mL)   Height: 5\' 10"  (177.8 cm) Weight: (!) 318 lb 12.6 oz (144.6 kg) IBW/kg (Calculated) : 73  Temp (24hrs), Avg:100.3 F (37.9 C), Min:100.3 F (37.9 C), Max:100.3 F (37.9 C)  Recent Labs  Lab 05/29/18 2143 05/29/18 2151 05/29/18 2204 05/29/18 2341 05/30/18 0340  WBC 12.8*  --   --   --  11.3*  CREATININE 1.95*  --  1.80*  --   --   LATICACIDVEN  --  4.56*  --  2.66*  --     Estimated Creatinine Clearance: 51 mL/min (A) (by C-G formula based on SCr of 1.8 mg/dL (H)).    Allergies  Allergen Reactions  . Actos [Pioglitazone] Swelling  . Timolol Other (See Comments)    Slow heart rate but tolerates Cosopt (dorzolamide-timolol) Pt has a rx for Timolol and uses it for glaucoma     Antimicrobials this admission: Vancomycin 05/29/2018 >> Zosyn 05/29/2018 >>   Dose adjustments this admission: -  Microbiology results: -  Thank you for allowing pharmacy to be a part of this patient's care.  Nani Skillern Crowford 05/30/2018 3:59 AM

## 2018-05-31 ENCOUNTER — Inpatient Hospital Stay (HOSPITAL_COMMUNITY): Payer: Medicare HMO

## 2018-05-31 DIAGNOSIS — I340 Nonrheumatic mitral (valve) insufficiency: Secondary | ICD-10-CM

## 2018-05-31 LAB — ECHOCARDIOGRAM COMPLETE
HEIGHTINCHES: 70 in
WEIGHTICAEL: 5100.56 [oz_av]

## 2018-05-31 LAB — BASIC METABOLIC PANEL
Anion gap: 9 (ref 5–15)
BUN: 33 mg/dL — AB (ref 6–20)
CHLORIDE: 103 mmol/L (ref 101–111)
CO2: 25 mmol/L (ref 22–32)
Calcium: 8.6 mg/dL — ABNORMAL LOW (ref 8.9–10.3)
Creatinine, Ser: 1.94 mg/dL — ABNORMAL HIGH (ref 0.61–1.24)
GFR calc Af Amer: 37 mL/min — ABNORMAL LOW (ref >60)
GFR calc non Af Amer: 32 mL/min — ABNORMAL LOW (ref >60)
GLUCOSE: 379 mg/dL — AB (ref 65–99)
POTASSIUM: 3.9 mmol/L (ref 3.5–5.1)
Sodium: 137 mmol/L (ref 135–145)

## 2018-05-31 LAB — GLUCOSE, CAPILLARY
GLUCOSE-CAPILLARY: 374 mg/dL — AB (ref 65–99)
GLUCOSE-CAPILLARY: 283 mg/dL — AB (ref 65–99)
GLUCOSE-CAPILLARY: 147 mg/dL — AB (ref 65–99)
Glucose-Capillary: 393 mg/dL — ABNORMAL HIGH (ref 65–99)
Glucose-Capillary: 181 mg/dL — ABNORMAL HIGH (ref 65–99)

## 2018-05-31 LAB — CBC
HEMATOCRIT: 30.2 % — AB (ref 39.0–52.0)
Hemoglobin: 8.8 g/dL — ABNORMAL LOW (ref 13.0–17.0)
MCH: 23.3 pg — ABNORMAL LOW (ref 26.0–34.0)
MCHC: 29.1 g/dL — AB (ref 30.0–36.0)
MCV: 79.9 fL (ref 78.0–100.0)
PLATELETS: 186 10*3/uL (ref 150–400)
RBC: 3.78 MIL/uL — ABNORMAL LOW (ref 4.22–5.81)
RDW: 20.5 % — ABNORMAL HIGH (ref 11.5–15.5)
WBC: 9.7 10*3/uL (ref 4.0–10.5)

## 2018-05-31 LAB — MAGNESIUM: MAGNESIUM: 2.1 mg/dL (ref 1.7–2.4)

## 2018-05-31 MED ORDER — INSULIN ASPART 100 UNIT/ML ~~LOC~~ SOLN
100.0000 [IU] | Freq: Three times a day (TID) | SUBCUTANEOUS | Status: DC
  Administered 2018-05-31 – 2018-06-01 (×2): 100 [IU] via SUBCUTANEOUS

## 2018-05-31 MED ORDER — INSULIN ASPART 100 UNIT/ML ~~LOC~~ SOLN: 0.0000 [IU] | Freq: Three times a day (TID) | SUBCUTANEOUS | Status: DC

## 2018-05-31 MED ORDER — PERFLUTREN LIPID MICROSPHERE
1.0000 mL | INTRAVENOUS | Status: AC | PRN
  Administered 2018-05-31: 2 mL via INTRAVENOUS

## 2018-05-31 MED ORDER — FUROSEMIDE 10 MG/ML IJ SOLN
20.0000 mg | Freq: Two times a day (BID) | INTRAMUSCULAR | Status: DC
  Administered 2018-05-31 – 2018-06-01 (×2): 20 mg via INTRAVENOUS
  Filled 2018-05-31 (×2): qty 2

## 2018-05-31 MED ORDER — SODIUM CHLORIDE 0.9 % IV SOLN
2.0000 g | INTRAVENOUS | Status: DC
  Administered 2018-05-31 – 2018-06-03 (×4): 2 g via INTRAVENOUS
  Filled 2018-05-31 (×2): qty 2
  Filled 2018-05-31 (×2): qty 20
  Filled 2018-05-31: qty 2

## 2018-05-31 MED ORDER — PERFLUTREN LIPID MICROSPHERE
INTRAVENOUS | Status: AC
  Filled 2018-05-31: qty 10

## 2018-05-31 MED ORDER — INSULIN ASPART 100 UNIT/ML ~~LOC~~ SOLN: 0.0000 [IU] | Freq: Every day | SUBCUTANEOUS | Status: DC

## 2018-05-31 MED ORDER — INSULIN ASPART 100 UNIT/ML ~~LOC~~ SOLN: 100.0000 [IU] | Freq: Every day | SUBCUTANEOUS | Status: DC

## 2018-05-31 NOTE — Progress Notes (Signed)
Inpatient Diabetes Program Recommendations  AACE/ADA: New Consensus Statement on Inpatient Glycemic Control (2015)  Target Ranges:  Prepandial:   less than 140 mg/dL      Peak postprandial:   less than 180 mg/dL (1-2 hours)      Critically ill patients:  140 - 180 mg/dL   Lab Results  Component Value Date   GLUCAP 374 (H) 05/31/2018   HGBA1C 6.8 03/10/2018    Review of Glycemic Control  Home meds: Novolin R -  300 units - Breakfast 200 units - Lunch 200 units - Dinner 200 units - HS   Pt previously on U-500 insulin. Insurance changed and will not cover. Instructed pt to contact his MD and request a prior authorization. Pt on 900 units/day. Occasionally has hypoglycemia. < 1x/week. Blood sugars in 300-400's in past 24H. Needs adjustment. Pt states "Nobody believes me when I come to the hospital that I take such a large amount of insulin at home." Eating 75%-100%.   Inpatient Diabetes Program Recommendations:     1/2 home dose of home meds. 150 - 100 - 100 - 100 qid Add HS correction.  Pt has appt with endo on June 09, 2018 and will discuss getting back on U-500 insulin.  Continue to follow. To page MD.  Thank you. Lorenda Peck, RD, LDN, CDE Inpatient Diabetes Coordinator [phone]

## 2018-05-31 NOTE — Progress Notes (Signed)
  Echocardiogram 2D Echocardiogram has been performed.  [name] 05/31/2018, 10:23 AM

## 2018-05-31 NOTE — Evaluation (Signed)
Physical Therapy Evaluation Patient Details Name: [name] MRN: [id number] DOB: 10-10-1941 Today's Date: 05/31/2018   History of Present Illness  This 77 y.o. male admitted after falling in bathroom.  Dx:  likely sepsis due to bil. LE cellulitis, volume overload due to diastolic HF with EF 97%;.  PMH aortic stensosi; DM, morbid obesity, sick sinus syndrom s/p PPM; A-Fib, peripheral neuropathy, renal disease, glaucoma, COPD   Clinical Impression  Pt admitted with above diagnosis. Pt currently with functional limitations due to the deficits listed below (see PT Problem List). Min assist for bed mobility, min/guard assist to take a few pivotal steps with RW from bed to recliner. Activity tolerance limited by fatigue. SaO2 95% on room air, 3/4 dyspnea with activity. Pt has had a significant decline in activity tolerance compared to baseline of ambulating with a cane or RW.  Pt will benefit from skilled PT to increase their independence and safety with mobility to allow discharge to the venue listed below.       Follow Up Recommendations SNF;Supervision for mobility/OOB    Equipment Recommendations  None recommended by PT    Recommendations for Other Services       Precautions / Restrictions Precautions Precautions: Fall Restrictions Weight Bearing Restrictions: No      Mobility  Bed Mobility Overal bed mobility: Needs Assistance Bed Mobility: Supine to Sit     Supine to sit: HOB elevated;Min assist     General bed mobility comments: assist to lift trunk   Transfers Overall transfer level: Needs assistance Equipment used: Rolling walker (2 wheeled) Transfers: Sit to/from Stand;Stand Pivot Transfers Sit to Stand: +2 safety/equipment;Min assist;From elevated surface Stand pivot transfers: Min guard       General transfer comment: sit to stand from elevated bed, min assist to rise, VCs hand placement, min/guard to take several pivotal steps with bari RW to recliner, +2  safety 2* recent fall  Ambulation/Gait Ambulation/Gait assistance: Min guard;+2 safety/equipment Gait Distance (Feet): 3 Feet Assistive device: Rolling walker (2 wheeled) Gait Pattern/deviations: Step-to pattern;Decreased stride length     General Gait Details: several pivotal steps with bari RW from bed to recliner, distance limited by fatigue  Stairs            Wheelchair Mobility    Modified Rankin (Stroke Patients Only)       Balance Overall balance assessment: Needs assistance Sitting-balance support: Feet supported Sitting balance-Leahy Scale: Fair     Standing balance support: Bilateral upper extremity supported Standing balance-Leahy Scale: Poor Standing balance comment: requires bil. UE support                              Pertinent Vitals/Pain Pain Assessment: No/denies pain    Home Living Family/patient expects to be discharged to:: Private residence Living Arrangements: Spouse/significant other Available Help at Discharge: Family;Available 24 hours/day Type of Home: House Home Access: Stairs to enter Entrance Stairs-Rails: Right Entrance Stairs-Number of Steps: 4 Home Layout: One level Home Equipment: Walker - 2 wheels;Walker - 4 wheels;Cane - single point;Shower seat;Hand held shower head Additional Comments: sleeps in a recliner    Prior Function Level of Independence: Needs assistance   Gait / Transfers Assistance Needed: uses a cane and furniture inside and RW out  ADL's / Homemaking Assistance Needed: wife helps with LB bathing and dressing, sits to shower        Hand Dominance   Dominant Hand: Left  Extremity/Trunk Assessment   Upper Extremity Assessment Upper Extremity Assessment: Generalized weakness    Lower Extremity Assessment Lower Extremity Assessment: Overall WFL for tasks assessed(B knee ext +4/5)    Cervical / Trunk Assessment Cervical / Trunk Assessment: Normal  Communication   Communication: HOH   Cognition Arousal/Alertness: Awake/alert Behavior During Therapy: WFL for tasks assessed/performed Overall Cognitive Status: Within Functional Limits for tasks assessed                                        General Comments      Exercises General Exercises - Lower Extremity Ankle Circles/Pumps: AROM;10 reps;Both;Supine Hip Flexion/Marching: AROM;5 reps;Both;Standing   Assessment/Plan    PT Assessment Patient needs continued PT services  PT Problem List Decreased activity tolerance;Decreased mobility;Decreased balance;Obesity       PT Treatment Interventions Gait training;DME instruction;Functional mobility training;Therapeutic activities;Therapeutic exercise;Patient/family education    PT Goals (Current goals can be found in the Care Plan section)  Acute Rehab PT Goals Patient Stated Goal: to get stronger and be able take care of self  PT Goal Formulation: With patient/family Time For Goal Achievement: 06/14/18 Potential to Achieve Goals: Good    Frequency Min 2X/week   Barriers to discharge        Co-evaluation               AM-PAC PT "6 Clicks" Daily Activity  Outcome Measure Difficulty turning over in bed (including adjusting bedclothes, sheets and blankets)?: Unable Difficulty moving from lying on back to sitting on the side of the bed? : Unable Difficulty sitting down on and standing up from a chair with arms (e.g., wheelchair, bedside commode, etc,.)?: A Lot Help needed moving to and from a bed to chair (including a wheelchair)?: A Little Help needed walking in hospital room?: Total Help needed climbing 3-5 steps with a railing? : Total 6 Click Score: 9    End of Session Equipment Utilized During Treatment: Gait belt Activity Tolerance: Patient limited by fatigue Patient left: in chair;with call bell/phone within reach;with family/visitor present Nurse Communication: Mobility status PT Visit Diagnosis: History of falling  (Z91.81);Difficulty in walking, not elsewhere classified (R26.2);Muscle weakness (generalized) (M62.81)    Time: 3893-7342 PT Time Calculation (min) (ACUTE ONLY): 21 min   Charges:   PT Evaluation $PT Eval Low Complexity: 1 Low     PT G Codes:          Philomena Doheny 05/31/2018, 10:52 AM [phone]

## 2018-05-31 NOTE — Consult Note (Addendum)
[facility] Nurse wound consult note Reason for Consult:Bilateral lower extremity cellulitis.  On Zosyn and Vancomycin.  Wears removable compression at home for chronic venous stasis. Will begin Unna boots.  Some cracking noted to lower legs with minimal bleeding.  Zinc layer and kerlix will promote healing with Coban for compression Intertriginous dermatitis to abdominal pannus. Present on admission.  Partial thickness Wound type:chronic venous stasis Pressure Injury POA: NA Measurement: Left and right pannus: 3 cm x 4 cm x 0.2 cm  Right medial lower leg:   1 cm fissure with scant bleeding noted Wound EUM:PNTI and moist Drainage (amount, consistency, odor) moderate weeping lower legs  Musty odor Periwound:chronic skin changes to legs Dressing procedure/placement/frequency:CLeanse bilateral lower legs with soap and water and pat dry.  Apply zinc layer secured with self adherent Coban. Change twice weekly.  Measure and cut length of InterDry Ag+ to fit in skin folds that have skin breakdown (abdominal pannus):  Tuck InterDry  Ag+ fabric into skin folds in a single layer, allow for 2 inches of overhang from skin edges to allow for wicking to occur May remove to bathe; dry area thoroughly and then tuck into affected areas again Do not apply any creams or ointments when using InterDry Ag+ DO NOT THROW AWAY FOR 5 DAYS unless soiled with stool DO NOT [facility] product, this will inactivate the silver in the material  New sheet of Interdry Ag+ should be applied after 5 days of use if patient continues to have skin breakdown  Will not follow at this time.  Please re-consult if needed.  Domenic Moras RN BSN [place] Pager [phone]

## 2018-05-31 NOTE — Progress Notes (Signed)
PROGRESS NOTE    [name]  HYI:[id number] DOB: February 13, 1941 DOA: 05/29/2018 PCP: Renato Shin, MD  Brief Narrative:  77 y.o. male with CHF (EF45-50%), pAF and hx of SSS s/p PPM (pacer-dependent) on eliquis, COPD, DMT2, CKD stage 2-3, and GBS bacteremia in January 2019 and lower extremity cellulitis who presented by EMS after mechanical fall while trying to ambulate to the bathroom. No syncope or loss of consciousness. He has noted increasing right leg warmth and tenderness for the past 2-3 weeks and new blisters that would "pop" with yellowish drainage. For the past week, he has also felt increasing malaise and weakness. Mild increase in dyspnea from baseline. Does not require home O2 at baseline. Lives with wife at home.  Regarding group B strep bacteremia in January 2019: suspected source was lower extremity cellulitis. He has since completed PCN --> ceftriaxone via PICC. TEE was recommended to rule out endocarditis or PPM infection; however team decided against given risk of respiratory decompensation.    Assessment & Plan:   Active Problems:   CKD (chronic kidney disease), stage III (HCC)   Cellulitis  1] bilateral lower extremity cellulitis right more than left patient with history of chronic venous stasis and chronic lower extremity edema and erythema but this edema is worse than his usual.  Patient on vancomycin and Rocephin.  Blood cultures are pending.  Patient has not had any fever since admission.  His legs are still red but slightly better.  Vital signs are stable afebrile no leukocytosis.  Patient with history of GBS bacteremia.  MRSA PCR positive.  2] acute on chronic CHF exacerbation with ejection fraction mildly decreased at 45%.  Patient is severely volume overloaded with this edema extending up to his thighs.  Will increase his Lasix to 20 mg IV twice a day.  Echocardiogram today results are pending.  3] uncontrolled type 2 diabetes patient on total of 900 units of regular  insulin at home.  Patient is currently getting NovoLog will increase that to 100 units 3 times a day before meals and 100 units at night prior to bed.  And increase the dose as needed.  Patient was taking U 500 insulin in the past.  But what once his insurance insurance changed  U 500 was no more covered so he remains on regular insulin at home.  He is followed by endocrinologist an outpatient and has a follow-up appointment with the endocrinologist first week of July.  4] chronic paroxysmal atrial fibrillation and sick sinus syndrome status post permanent pacemaker continue Eliquis.  Continue amiodarone, metoprolol      DVT prophylaxis:  Code Status: Family Communication:  Disposition Plan: Consultants:   Procedures: Antimicrobials  Subjective:  Objective: Vitals:   05/31/18 1024 05/31/18 1045 05/31/18 1200 05/31/18 1218  BP:    106/61  Pulse: 78 71  72  Resp: (!) 23 20  19   Temp:   (!) 97.5 F (36.4 C)   TempSrc:   Oral   SpO2: 100% 95%  99%  Weight:      Height:        Intake/Output Summary (Last 24 hours) at 05/31/2018 1421 Last data filed at 05/31/2018 1024 Gross per 24 hour  Intake 1600.54 ml  Output 1650 ml  Net -49.46 ml   Filed Weights   05/29/18 2136 05/30/18 0200  Weight: (!) 141.5 kg (312 lb) (!) 144.6 kg (318 lb 12.6 oz)    Examination:  General exam: Appears calm and comfortable  Respiratory system:  Clear to auscultation. Respiratory effort normal. Cardiovascular system: S1 & S2 heard, RRR. No JVD, murmurs, rubs, gallops or clicks. No pedal edema. Gastrointestinal system: Abdomen is nondistended, soft and nontender. No organomegaly or masses felt. Normal bowel sounds heard. Central nervous system: Alert and oriented. No focal neurological deficits. Extremities: Symmetric 5 x 5 power. Skin: No rashes, lesions or ulcers Psychiatry: Judgement and insight appear normal. Mood & affect appropriate.     Data Reviewed: I have personally reviewed following  labs and imaging studies  CBC: Recent Labs  Lab 05/29/18 2143 05/29/18 2204 05/30/18 0340 05/31/18 0331  WBC 12.8*  --  11.3* 9.7  NEUTROABS 10.5*  --   --   --   HGB 10.0* 9.5* 8.6* 8.8*  HCT 32.9* 28.0* 29.5* 30.2*  MCV 77.0*  --  78.5 79.9  PLT 220  --  190 149   Basic Metabolic Panel: Recent Labs  Lab 05/29/18 2143 05/29/18 2204 05/30/18 0340 05/30/18 1849 05/31/18 0331  NA 136 136 138 137 137  K 4.2 4.0 3.0* 3.6 3.9  CL 100* 98* 103 105 103  CO2 25  --  25 25 25   GLUCOSE 289* 287* 310* 427* 379*  BUN 33* 31* 31* 35* 33*  CREATININE 1.95* 1.80* 1.90* 2.16* 1.94*  CALCIUM 8.7*  --  8.2* 8.4* 8.6*  MG  --   --   --   --  2.1   GFR: Estimated Creatinine Clearance: 47.3 mL/min (A) (by C-G formula based on SCr of 1.94 mg/dL (H)). Liver Function Tests: Recent Labs  Lab 05/29/18 2143  AST 60*  ALT 15*  ALKPHOS 92  BILITOT 1.1  PROT 7.6  ALBUMIN 3.2*   No results for input(s): LIPASE, AMYLASE in the last 168 hours. No results for input(s): AMMONIA in the last 168 hours. Coagulation Profile: No results for input(s): INR, PROTIME in the last 168 hours. Cardiac Enzymes: No results for input(s): CKTOTAL, CKMB, CKMBINDEX, TROPONINI in the last 168 hours. BNP (last 3 results) Recent Labs    10/26/17 1425  PROBNP 1,148*   HbA1C: No results for input(s): HGBA1C in the last 72 hours. CBG: Recent Labs  Lab 05/30/18 1124 05/30/18 1556 05/30/18 2059 05/31/18 0751 05/31/18 1143  GLUCAP 381* 439* 310* 393* 374*   Lipid Profile: No results for input(s): CHOL, HDL, LDLCALC, TRIG, CHOLHDL, LDLDIRECT in the last 72 hours. Thyroid Function Tests: No results for input(s): TSH, T4TOTAL, FREET4, T3FREE, THYROIDAB in the last 72 hours. Anemia Panel: No results for input(s): VITAMINB12, FOLATE, FERRITIN, TIBC, IRON, RETICCTPCT in the last 72 hours. Sepsis Labs: Recent Labs  Lab 05/29/18 2151 05/29/18 2341 05/30/18 0537  LATICACIDVEN 4.56* 2.66* 1.3    Recent  Results (from the past 240 hour(s))  Blood culture (routine x 2)     Status: None (Preliminary result)   Collection Time: 05/29/18 10:03 PM  Result Value Ref Range Status   Specimen Description   Final    BLOOD RIGHT FOREARM Blood Culture adequate volume BOTTLES DRAWN AEROBIC AND ANAEROBIC Performed at [facility] [address], [place], [place] [address]    Special Requests   Final    NONE Performed at [facility], [place] [address], [name], [place] [address]    Culture   Final    NO GROWTH 1 DAY Performed at [facility], [place] [address], [place], [place] [address]    Report Status PENDING  Incomplete  Blood culture (routine x 2)  Status: None (Preliminary result)   Collection Time: 05/29/18 10:03 PM  Result Value Ref Range Status   Specimen Description   Final    BLOOD RIGHT FOREARM Blood Culture adequate volume BOTTLES DRAWN AEROBIC AND ANAEROBIC Performed at [facility] [address] [place], [place] [address]    Special Requests   Final    NONE Performed at [facility], [place] [address], [place], [place] [address]    Culture   Final    NO GROWTH 1 DAY Performed at [facility], [place] [address] [place], [place] [address]    Report Status PENDING  Incomplete  MRSA PCR Screening     Status: Abnormal   Collection Time: 05/30/18  2:00 AM  Result Value Ref Range Status   MRSA by PCR POSITIVE (A) NEGATIVE Final    Comment:        The GeneXpert MRSA Assay (FDA approved for NASAL specimens only), is one component of a comprehensive MRSA colonization surveillance program. It is not intended to diagnose MRSA infection nor to guide or monitor treatment for MRSA infections. RESULT CALLED TO, READ BACK BY AND VERIFIED WITH: Heide Spark [id number] @[id number]  BY V.WILKINS Performed at [facility] [address] [place], [place] [address]          Radiology  Studies: Dg Chest 2 View  Result Date: 05/29/2018 CLINICAL DATA:  Nausea, fever, shortness of breath, ex-smoker. EXAM: CHEST - 2 VIEW COMPARISON:  12/30/2017. FINDINGS: Enlarged cardiac silhouette with an interval decrease in size. Mildly prominent pulmonary vasculature. Clear lungs with resolved prominence of the interstitial markings. The lungs are mildly hyperexpanded with mild peribronchial thickening. Stable left subclavian pacemaker leads. Thoracic spine degenerative changes. IMPRESSION: 1. Mild cardiomegaly with improvement. 2. Mild pulmonary vascular congestion. 3. Resolved interstitial pulmonary edema. 4. Mild changes of COPD and chronic bronchitis. Electronically Signed   By: Claudie Revering M.D.   On: 05/29/2018 23:01   Ct Head Wo Contrast  Result Date: 05/29/2018 CLINICAL DATA:  [address] and hit his head. EXAM: CT HEAD WITHOUT CONTRAST TECHNIQUE: Contiguous axial images were obtained from the base of the skull through the vertex without intravenous contrast. COMPARISON:  04/26/2008. FINDINGS: Brain: Diffusely enlarged ventricles and subarachnoid spaces. Patchy white matter low density in both cerebral hemispheres. No intracranial hemorrhage, mass lesion or CT evidence of acute infarction. Vascular: No hyperdense vessel or unexpected calcification. Skull: Normal. Negative for fracture or focal lesion. Sinuses/Orbits: Unremarkable paranasal sinuses. Status post bilateral cataract extraction and right globe band. Other: None. IMPRESSION: 1. No acute abnormality. 2. Mild diffuse cerebral and cerebellar atrophy. 3. Minimal chronic small vessel white matter ischemic changes in both cerebral hemispheres. Electronically Signed   By: Claudie Revering M.D.   On: 05/29/2018 23:10        Scheduled Meds: . acidophilus  1 capsule Oral QPM  . amiodarone  200 mg Oral Daily  . apixaban  2.5 mg Oral BID  . aspirin EC  81 mg Oral Daily  . brimonidine  1 drop Left Eye TID  . Chlorhexidine Gluconate Cloth  6 each  Topical Q0600  . dorzolamide  1 drop Both Eyes BID  . fluticasone  2 spray Each Nare Daily  . furosemide  20 mg Intravenous BID  . guaiFENesin  600 mg Oral BID  . insulin aspart  0-15 Units Subcutaneous TID WC  . insulin aspart  100 Units Subcutaneous TID WC  . insulin aspart  100 Units Subcutaneous  QHS  . latanoprost  1 drop Left Eye QHS  . levothyroxine  75 mcg Oral QAC breakfast  . mometasone-formoterol  2 puff Inhalation BID  . mupirocin ointment  1 application Nasal BID  . Netarsudil Dimesylate  1 drop Left Eye QPM  . omega-3 acid ethyl esters  1,000 mg Oral Daily  . pneumococcal 23 valent vaccine  0.5 mL Intramuscular Tomorrow-1000  . potassium chloride  20 mEq Oral Daily  . rosuvastatin  20 mg Oral QPM  . timolol  1 drop Both Eyes BID   Continuous Infusions: . cefTRIAXone (ROCEPHIN)  IV    . vancomycin Stopped (05/31/18 0622)     LOS: 1 day    Georgette Shell, MD [facility] If 7PM-7AM, please contact night-coverage [email or web address] Password [email or web address] 05/31/2018, 2:21 PM

## 2018-06-01 ENCOUNTER — Inpatient Hospital Stay (HOSPITAL_COMMUNITY): Payer: Medicare HMO

## 2018-06-01 DIAGNOSIS — A419 Sepsis, unspecified organism: Principal | ICD-10-CM

## 2018-06-01 DIAGNOSIS — I350 Nonrheumatic aortic (valve) stenosis: Secondary | ICD-10-CM

## 2018-06-01 DIAGNOSIS — R0902 Hypoxemia: Secondary | ICD-10-CM

## 2018-06-01 DIAGNOSIS — I5023 Acute on chronic systolic (congestive) heart failure: Secondary | ICD-10-CM

## 2018-06-01 DIAGNOSIS — I480 Paroxysmal atrial fibrillation: Secondary | ICD-10-CM

## 2018-06-01 DIAGNOSIS — N183 Chronic kidney disease, stage 3 (moderate): Secondary | ICD-10-CM

## 2018-06-01 DIAGNOSIS — R6520 Severe sepsis without septic shock: Secondary | ICD-10-CM

## 2018-06-01 DIAGNOSIS — L03115 Cellulitis of right lower limb: Secondary | ICD-10-CM

## 2018-06-01 LAB — GLUCOSE, CAPILLARY
GLUCOSE-CAPILLARY: 168 mg/dL — AB (ref 70–99)
GLUCOSE-CAPILLARY: 175 mg/dL — AB (ref 70–99)
GLUCOSE-CAPILLARY: 40 mg/dL — AB (ref 70–99)
GLUCOSE-CAPILLARY: 71 mg/dL (ref 70–99)
GLUCOSE-CAPILLARY: 78 mg/dL (ref 70–99)
Glucose-Capillary: 80 mg/dL (ref 70–99)
Glucose-Capillary: 156 mg/dL — ABNORMAL HIGH (ref 70–99)

## 2018-06-01 LAB — CBC
HCT: 30.6 % — ABNORMAL LOW (ref 39.0–52.0)
Hemoglobin: 8.8 g/dL — ABNORMAL LOW (ref 13.0–17.0)
MCH: 22.9 pg — AB (ref 26.0–34.0)
MCHC: 28.8 g/dL — AB (ref 30.0–36.0)
MCV: 79.7 fL (ref 78.0–100.0)
PLATELETS: 190 10*3/uL (ref 150–400)
RBC: 3.84 MIL/uL — ABNORMAL LOW (ref 4.22–5.81)
RDW: 20.7 % — ABNORMAL HIGH (ref 11.5–15.5)
WBC: 9.5 10*3/uL (ref 4.0–10.5)

## 2018-06-01 LAB — BASIC METABOLIC PANEL
Anion gap: 7 (ref 5–15)
BUN: 27 mg/dL — AB (ref 8–23)
CALCIUM: 8.5 mg/dL — AB (ref 8.9–10.3)
CO2: 28 mmol/L (ref 22–32)
Chloride: 105 mmol/L (ref 98–111)
Creatinine, Ser: 1.64 mg/dL — ABNORMAL HIGH (ref 0.61–1.24)
GFR calc Af Amer: 46 mL/min — ABNORMAL LOW (ref >60)
GFR, EST NON AFRICAN AMERICAN: 39 mL/min — AB (ref >60)
GLUCOSE: 80 mg/dL (ref 70–99)
Potassium: 3.4 mmol/L — ABNORMAL LOW (ref 3.5–5.1)
Sodium: 140 mmol/L (ref 135–145)

## 2018-06-01 LAB — MAGNESIUM: MAGNESIUM: 1.9 mg/dL (ref 1.7–2.4)

## 2018-06-01 MED ORDER — INSULIN ASPART 100 UNIT/ML ~~LOC~~ SOLN: 75.0000 [IU] | Freq: Every day | SUBCUTANEOUS | Status: DC

## 2018-06-01 MED ORDER — FUROSEMIDE 10 MG/ML IJ SOLN
40.0000 mg | Freq: Two times a day (BID) | INTRAMUSCULAR | Status: DC
  Administered 2018-06-01 – 2018-06-02 (×2): 40 mg via INTRAVENOUS
  Filled 2018-06-01 (×2): qty 4

## 2018-06-01 MED ORDER — INSULIN ASPART 100 UNIT/ML ~~LOC~~ SOLN
90.0000 [IU] | Freq: Three times a day (TID) | SUBCUTANEOUS | Status: DC
Start: 2018-06-01 — End: 2018-06-02
  Administered 2018-06-01 (×2): 90 [IU] via SUBCUTANEOUS

## 2018-06-01 MED ORDER — DEXTROSE 50 % IV SOLN
INTRAVENOUS | Status: AC
  Administered 2018-06-01: 25 mL
  Filled 2018-06-01: qty 50

## 2018-06-01 MED ORDER — APIXABAN 5 MG PO TABS
5.0000 mg | ORAL_TABLET | Freq: Two times a day (BID) | ORAL | Status: DC
  Administered 2018-06-01 – 2018-06-08 (×14): 5 mg via ORAL
  Filled 2018-06-01 (×14): qty 1

## 2018-06-01 MED ORDER — IPRATROPIUM-ALBUTEROL 0.5-2.5 (3) MG/3ML IN SOLN: 3.0000 mL | Freq: Four times a day (QID) | RESPIRATORY_TRACT | Status: DC

## 2018-06-01 MED ORDER — BISACODYL 5 MG PO TBEC
10.0000 mg | DELAYED_RELEASE_TABLET | Freq: Every day | ORAL | Status: DC
  Administered 2018-06-01 – 2018-06-08 (×8): 10 mg via ORAL
  Filled 2018-06-01 (×8): qty 2

## 2018-06-01 MED ORDER — PNEUMOCOCCAL VAC POLYVALENT 25 MCG/0.5ML IJ INJ
0.5000 mL | INJECTION | INTRAMUSCULAR | Status: AC
Start: 2018-06-02 — End: 2018-06-02
  Administered 2018-06-02: 0.5 mL via INTRAMUSCULAR
  Filled 2018-06-01: qty 0.5

## 2018-06-01 MED ORDER — POTASSIUM CHLORIDE CRYS ER 20 MEQ PO TBCR
40.0000 meq | EXTENDED_RELEASE_TABLET | Freq: Once | ORAL | Status: AC
  Administered 2018-06-01: 40 meq via ORAL
  Filled 2018-06-01: qty 2

## 2018-06-01 MED ORDER — LACTULOSE 10 GM/15ML PO SOLN
20.0000 g | Freq: Once | ORAL | Status: DC
  Filled 2018-06-01 (×2): qty 30

## 2018-06-01 MED ORDER — IPRATROPIUM-ALBUTEROL 0.5-2.5 (3) MG/3ML IN SOLN: 3.0000 mL | Freq: Four times a day (QID) | RESPIRATORY_TRACT | Status: DC | PRN

## 2018-06-01 MED ORDER — MAGNESIUM SULFATE 2 GM/50ML IV SOLN
2.0000 g | Freq: Once | INTRAVENOUS | Status: AC
  Administered 2018-06-01: 2 g via INTRAVENOUS
  Filled 2018-06-01: qty 50

## 2018-06-01 NOTE — Consult Note (Signed)
CONSULTATION NOTE   Patient Name: [name] Date of Encounter: 06/01/2018 Cardiologist: No primary care provider on file.  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  Now admitted for cellulitis and found to have worsening LV systolic function and now severe aortic stenosis.  HPI   [name] is a 77 y.o. male who is being seen today for the evaluation of CHF at the request of Dr. Rodena Piety. This is a 77 year old male patient who is followed by Truitt Merle, NP for history of diastolic congestive heart failure, morbid obesity, PAF, coronary disease and history of sick sinus syndrome status post permanent pacemaker (managed by Dr. Rayann Heman).  He has a history of cardioversions in the past, last in 2016.  He has been maintained on amiodarone and has generally maintained sinus rhythm.  He is also known to have moderate aortic stenosis.  He was recently seen in March 2019 after admission for strep bacteremia/sepsis.  He was now admitted to [facility] long hospital on 622 with weakness, malaise and mechanical fall with swelling and redness of the right leg concerning for cellulitis.  He was initially felt to be volume overloaded and given IV Lasix.  He was noted to be V paced on telemetry and remained on Eliquis 2.5 mg twice daily.  A repeat echo was ordered yesterday which shows a decline in LVEF from 45% down to 35 to 40%.  There is moderate concentric LVH with pacemaker related dyssynergy of the septum but no regional wall motion abnormalities.  The aortic valve was severely stenosed with a mean gradient of around 0.9 cm -mean gradient 41 mmHg and peak gradient of 71 mmHg.  Cardiology is asked to evaluate for worsening systolic congestive  heart failure and severe aortic stenosis.Marland Kitchen  PMHx   Past Medical History:  Diagnosis Date  . Anemia    hx of  . Anxiety   . CAD (coronary artery disease)   . Cancer [facility]) 2014   bladder cancer AND RIGHT URETERAL CANCER  . COPD (chronic obstructive pulmonary disease) (HCC)    CPAP  . Depression   . Diabetes mellitus    TypeII  . Diastolic dysfunction with chronic heart failure ([place])   . Gait difficulty    slow gait"ambulates with walker"  . GERD (gastroesophageal reflux disease)   . 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 H/O Legionnaire's disease 2003  . History of blood clots    R groin  . History of kidney stones   . Hyperkalemia   . Hyperlipemia   . Hypertensive cardiovascular-renal disease   . Hypothyroidism   . Morbid obesity ([place])   . OSA on CPAP    using CPAP although it is hard for him to tolerate  . Osteoarthritis    fingers  . Pancreatitis   . Peripheral neuropathy   . Persistent atrial fibrillation ([place])   . Pneumonia 2003  . Renal insufficiency   . Shortness of breath dyspnea    with exertion  . Sick sinus syndrome ([place]) 06/2010   s/p PPM by [employer]  . Venous insufficiency     Past Surgical History:  Procedure Laterality Date  . BELPHAROPTOSIS REPAIR Right    Glaucoma  .  CARDIAC CATHETERIZATION     11-02-13  . CARDIOVERSION N/A 05/16/2014   Procedure: CARDIOVERSION;  Surgeon: Josue Hector, MD;  Location: [facility] ENDOSCOPY;  Service: Cardiovascular;  Laterality: N/A;  . CARDIOVERSION N/A 11/08/2014   Procedure: CARDIOVERSION;  Surgeon: Fay Records, MD;  Location: [place];  Service: Cardiovascular;  Laterality: N/A;  . CARDIOVERSION N/A 06/22/2015   Procedure: CARDIOVERSION;  Surgeon: Jerline Pain, MD;  Location: [place];  Service: Cardiovascular;  Laterality: N/A;  . CHOLECYSTECTOMY  2012  . CYSTOSCOPY W/ URETERAL STENT PLACEMENT Right 10/26/2013   Procedure: CYSTOSCOPY WITH RETROGRADE PYELOGRAM/URETERAL STENT PLACEMENT;  Surgeon:  Alexis Frock, MD;  Location: [facility];  Service: Urology;  Laterality: Right;  . CYSTOSCOPY WITH URETEROSCOPY AND STENT PLACEMENT Right 01/11/2014   Procedure: CYSTOSCOPY WITH URETEROSCOPY ,RIGHT RETROGRADE AND STENT CHANGE AND LASER OF URETERAL TUMOR;  Surgeon: Alexis Frock, MD;  Location: [facility];  Service: Urology;  Laterality: Right;  . CYSTOSCOPY/RETROGRADE/URETEROSCOPY Right 08/23/2014   Procedure: CYSTOSCOPY/RETROGRADE/ DIAGNOSTIC URETEROSCOPY/RIGHT RENAL STONE EXTRACTION;  Surgeon: Alexis Frock, MD;  Location: [facility];  Service: Urology;  Laterality: Right;  . EMBOLECTOMY Left 11/02/2013   Procedure: LEFT FEMORAL EMBOLECTOMY, LEFT FEMORAL ARTERY ENDARTERECTOMY WITH DACRON PATCH ANGIOPLASTY.;  Surgeon: Mal Misty, MD;  Location: [place];  Service: Vascular;  Laterality: Left;  . EYE SURGERY Left    cataract  . GROIN DEBRIDEMENT Right 05/08/2015   Procedure: REMOVAL OF RIGHT GROIN MASS;  Surgeon: Angelia Mould, MD;  Location: [place];  Service: Vascular;  Laterality: Right;  . HOLMIUM LASER APPLICATION Right 01/13/7123   Procedure: HOLMIUM LASER APPLICATION;  Surgeon: Alexis Frock, MD;  [facility];  Service: Urology;  Laterality: Right;  . INSERT / REPLACE / REMOVE PACEMAKER  2011  . NASAL SEPTUM SURGERY  1967  . PACEMAKER PLACEMENT  06/2010   [facility] Accent RF DR, Model [id number] ( Serial number [id number])  . TEE WITHOUT CARDIOVERSION N/A 05/16/2014   Procedure: TRANSESOPHAGEAL ECHOCARDIOGRAM (TEE);  Surgeon: Josue Hector, MD;  Location: [facility] ENDOSCOPY;  Service: Cardiovascular;  Laterality: N/A;  . thromboembolectomy and four compartment fasciotomy Right 2009   leg  . TRANSURETHRAL RESECTION OF BLADDER TUMOR WITH GYRUS (TURBT-GYRUS) N/A 10/26/2013   Procedure: TRANSURETHRAL RESECTION OF BLADDER TUMOR WITH GYRUS (TURBT-GYRUS);  Surgeon: Alexis Frock, MD;  [facility];  Service: Urology;  Laterality: N/A;  . TRANSURETHRAL RESECTION OF BLADDER TUMOR WITH GYRUS  (TURBT-GYRUS) N/A 01/11/2014   Procedure: TRANSURETHRAL RESECTION OF BLADDER TUMOR WITH GYRUS (TURBT-GYRUS);  Surgeon: Alexis Frock, MD;  [facility];  Service: Urology;  Laterality: N/A;  . WISDOM TOOTH EXTRACTION      FAMHx   Family History  Problem Relation Age of Onset  . Liver cancer Mother        deceased age 71  . Cancer Mother        liver cancer  . Heart attack Father   . Colon cancer Neg Hx     SOCHx    reports that he quit smoking about 11 years ago. His smoking use included cigarettes. He has a 100.00 pack-year smoking history. He has never used smokeless tobacco. He reports that he does not drink alcohol or use drugs.  Outpatient Medications   No current facility-administered medications on file prior to encounter.    Current Outpatient Medications on File Prior to Encounter  Medication Sig Dispense Refill  . acetaminophen (TYLENOL) 500 MG tablet Take 1,000 mg by mouth 2 (two) times  daily as needed (pain/headache).     Marland Kitchen albuterol (VENTOLIN HFA) 108 (90 Base) MCG/ACT inhaler Inhale 2 puffs into the lungs every 6 (six) hours as needed for wheezing or shortness of breath. 18 Inhaler 1  . amiodarone (PACERONE) 200 MG tablet Take 1 tablet (200 mg total) by mouth daily. 90 tablet 3  . apixaban (ELIQUIS) 5 MG TABS tablet Take 1 tablet (5 mg total) by mouth 2 (two) times daily. 180 tablet 3  . Ascorbic Acid (VITAMIN C) 500 MG tablet Take 500 mg by mouth daily.     . brimonidine (ALPHAGAN P) 0.1 % SOLN Place 1 drop into the left eye 3 (three) times daily.     . budesonide-formoterol (SYMBICORT) 80-4.5 MCG/ACT inhaler Inhale 2 puffs into the lungs 2 (two) times daily. 1 Inhaler 0  . cholecalciferol (VITAMIN D) 1000 UNITS tablet Take 1,000 Units by mouth 2 (two) times daily.     Marland Kitchen CINNAMON PO Take 2,000 mg by mouth daily.    Marland Kitchen dextromethorphan (DELSYM) 30 MG/5ML liquid Take 90 mg by mouth 2 (two) times daily as needed for cough.    . Digestive Enzymes (MULTI-ENZYME) TABS  Take 1 tablet by mouth daily.    Marland Kitchen docusate sodium (COLACE) 100 MG capsule Take 100-200 mg by mouth daily as needed for mild constipation.     . dorzolamide (TRUSOPT) 2 % ophthalmic solution Place 1 drop into both eyes 2 (two) times daily.    . fluticasone (FLONASE) 50 MCG/ACT nasal spray Place 2 sprays into both nostrils daily.    . furosemide (LASIX) 40 MG tablet Take 1 tablet (40 mg total) by mouth 2 (two) times daily. 180 tablet 3  . guaiFENesin 200 MG tablet Take 400-600 mg by mouth 2 (two) times daily. 434m in the morning and 6031min the evening    . insulin regular (NOVOLIN R) 100 units/mL injection 4 times a day (just before each meal), 300-210-210-190 units (Patient taking differently: Inject 150-300 Units into the skin 4 (four) times daily. 4 times a day (just before each meal), 300-240-220-150 units) 28 vial 3  . ipratropium-albuterol (DUONEB) 0.5-2.5 (3) MG/3ML SOLN Take 3 mLs by nebulization every 4 (four) hours as needed.    . Lactobacillus (ACIDOPHILUS) CAPS capsule Take 1 capsule by mouth every evening.     . Marland Kitchenevothyroxine (SYNTHROID, LEVOTHROID) 75 MCG tablet Take 1 tablet (75 mcg total) by mouth daily before breakfast. 90 tablet 3  . metoprolol tartrate (LOPRESSOR) 25 MG tablet Take 1 tablet (25 mg total) by mouth 2 (two) times daily. 180 tablet 3  . Multiple Vitamin (MULTIVITAMIN WITH MINERALS) TABS tablet Take 1 tablet by mouth every evening.     . Mckinley Jewelimesylate (RHOPRESSA) 0.02 % SOLN Place 1 drop into the left eye daily.    . Omega-3 Fatty Acids (FISH OIL) 1000 MG CAPS Take 1,000 mg by mouth daily.     . Marland Kitchenmeprazole (PRILOSEC) 40 MG capsule Take 1 capsule (40 mg total) by mouth daily. 90 capsule 3  . Resveratrol 100 MG CAPS Take 100 mg by mouth every evening.    . rosuvastatin (CRESTOR) 40 MG tablet Take 0.5 tablets (20 mg total) by mouth every evening. 45 tablet 3  . Timolol Maleate 0.5 % (DAILY) SOLN Place 1 drop into both eyes 2 (two) times daily.    . travoprost,  benzalkonium, (TRAVATAN) 0.004 % ophthalmic solution Place 1 drop into the left eye at bedtime.    . traMADol (ULTRAM) 50 MG  tablet TAKE 2 TABLETS BY MOUTH TWICE A DAY AS NEEDED FOR PAIN 60 tablet 2    Inpatient Medications    Scheduled Meds: . acidophilus  1 capsule Oral QPM  . amiodarone  200 mg Oral Daily  . apixaban  5 mg Oral BID  . aspirin EC  81 mg Oral Daily  . bisacodyl  10 mg Oral Daily  . brimonidine  1 drop Left Eye TID  . Chlorhexidine Gluconate Cloth  6 each Topical Q0600  . dorzolamide  1 drop Both Eyes BID  . fluticasone  2 spray Each Nare Daily  . furosemide  20 mg Intravenous BID  . guaiFENesin  600 mg Oral BID  . insulin aspart  0-15 Units Subcutaneous TID WC  . insulin aspart  0-5 Units Subcutaneous QHS  . insulin aspart  75 Units Subcutaneous QHS  . insulin aspart  90 Units Subcutaneous TID WC  . lactulose  20 g Oral Once  . latanoprost  1 drop Left Eye QHS  . levothyroxine  75 mcg Oral QAC breakfast  . mometasone-formoterol  2 puff Inhalation BID  . mupirocin ointment  1 application Nasal BID  . Netarsudil Dimesylate  1 drop Left Eye QPM  . omega-3 acid ethyl esters  1,000 mg Oral Daily  . [START ON 06/02/2018] pneumococcal 23 valent vaccine  0.5 mL Intramuscular Tomorrow-1000  . potassium chloride  20 mEq Oral Daily  . rosuvastatin  20 mg Oral QPM  . timolol  1 drop Both Eyes BID    Continuous Infusions: . cefTRIAXone (ROCEPHIN)  IV 2 g (06/01/18 1345)  . vancomycin Stopped (06/01/18 0850)    PRN Meds: calcium carbonate, ipratropium-albuterol, traMADol   ALLERGIES   Allergies  Allergen Reactions  . Actos [Pioglitazone] Swelling  . Timolol Other (See Comments)    Slow heart rate but tolerates Cosopt (dorzolamide-timolol) Pt has a rx for Timolol and uses it for glaucoma     ROS   Pertinent items noted in HPI and remainder of comprehensive ROS otherwise negative.  Vitals   Vitals:   06/01/18 0752 06/01/18 0800 06/01/18 1000 06/01/18 1200    BP:   (!) 100/47 (!) 85/63  Pulse:   73 77  Resp:   19 19  Temp:  98.3 F (36.8 C)    TempSrc:  Oral    SpO2: 94%  97% 98%  Weight:      Height:        Intake/Output Summary (Last 24 hours) at 06/01/2018 1353 Last data filed at 06/01/2018 0645 Gross per 24 hour  Intake 340 ml  Output 2200 ml  Net -1860 ml   Filed Weights   05/29/18 2136 05/30/18 0200  Weight: (!) 312 lb (141.5 kg) (!) 318 lb 12.6 oz (144.6 kg)    Physical Exam   General appearance: alert, mild distress and morbidly obese Neck: JVD - several cm above sternal notch, no carotid bruit and thyroid not enlarged, symmetric, no tenderness/mass/nodules Lungs: diminished breath sounds bilaterally Heart: regular rate and rhythm, S1: normal intensity, S2: decreased intensity and systolic murmur: late systolic 3/6, crescendo at 2nd right intercostal space Abdomen: soft, non-tender; bowel sounds normal; no masses,  no organomegaly Extremities: edema 3+ edema, erythema - legs wrapped Pulses: faint pulses Skin: LE erythema Neurologic: Mental status: Alert, oriented, thought content appropriate, very HOH Psych: Pleasant  Labs   Results for orders placed or performed during the hospital encounter of 05/29/18 (from the past 48 hour(s))  Glucose, capillary  Status: Abnormal   Collection Time: 05/30/18  3:56 PM  Result Value Ref Range   Glucose-Capillary 439 (H) 65 - 99 mg/dL  Basic metabolic panel     Status: Abnormal   Collection Time: 05/30/18  6:49 PM  Result Value Ref Range   Sodium 137 135 - 145 mmol/L   Potassium 3.6 3.5 - 5.1 mmol/L   Chloride 105 101 - 111 mmol/L   CO2 25 22 - 32 mmol/L   Glucose, Bld 427 (H) 65 - 99 mg/dL   BUN 35 (H) 6 - 20 mg/dL   Creatinine, Ser 2.16 (H) 0.61 - 1.24 mg/dL   Calcium 8.4 (L) 8.9 - 10.3 mg/dL   GFR calc non Af Amer 28 (L) >60 mL/min   GFR calc Af Amer 33 (L) >60 mL/min    Comment: (NOTE) The eGFR has been calculated using the CKD EPI equation. This calculation has  not been validated in all clinical situations. eGFR's persistently <60 mL/min signify possible Chronic Kidney Disease.    Anion gap 7 5 - 15    Comment: Performed at [facility], [place] [address] [place], [place] [address]  Glucose, capillary     Status: Abnormal   Collection Time: 05/30/18  8:59 PM  Result Value Ref Range   Glucose-Capillary 310 (H) 65 - 99 mg/dL   Comment 1 Notify RN    Comment 2 Document in Chart   Basic metabolic panel     Status: Abnormal   Collection Time: 05/31/18  3:31 AM  Result Value Ref Range   Sodium 137 135 - 145 mmol/L   Potassium 3.9 3.5 - 5.1 mmol/L   Chloride 103 101 - 111 mmol/L   CO2 25 22 - 32 mmol/L   Glucose, Bld 379 (H) 65 - 99 mg/dL   BUN 33 (H) 6 - 20 mg/dL   Creatinine, Ser 1.94 (H) 0.61 - 1.24 mg/dL   Calcium 8.6 (L) 8.9 - 10.3 mg/dL   GFR calc non Af Amer 32 (L) >60 mL/min   GFR calc Af Amer 37 (L) >60 mL/min    Comment: (NOTE) The eGFR has been calculated using the CKD EPI equation. This calculation has not been validated in all clinical situations. eGFR's persistently <60 mL/min signify possible Chronic Kidney Disease.    Anion gap 9 5 - 15    Comment: Performed at [facility], [place] [address] [place], [place] [address]  CBC     Status: Abnormal   Collection Time: 05/31/18  3:31 AM  Result Value Ref Range   WBC 9.7 4.0 - 10.5 K/uL   RBC 3.78 (L) 4.22 - 5.81 MIL/uL   Hemoglobin 8.8 (L) 13.0 - 17.0 g/dL   HCT 30.2 (L) 39.0 - 52.0 %   MCV 79.9 78.0 - 100.0 fL   MCH 23.3 (L) 26.0 - 34.0 pg   MCHC 29.1 (L) 30.0 - 36.0 g/dL   RDW 20.5 (H) 11.5 - 15.5 %   Platelets 186 150 - 400 K/uL    Comment: Performed at [facility], [place] [address], [place], [place] [address]  Magnesium     Status: None   Collection Time: 05/31/18  3:31 AM  Result Value Ref Range   Magnesium 2.1 1.7 - 2.4 mg/dL    Comment: Performed at [facility], [place] [address],  [place], [place] [address]  Glucose, capillary     Status: Abnormal   Collection Time: 05/31/18  7:51 AM  Result Value Ref Range   Glucose-Capillary 393 (H) 65 - 99 mg/dL  Glucose, capillary     Status: Abnormal   Collection Time: 05/31/18 11:43 AM  Result Value Ref Range   Glucose-Capillary 374 (H) 65 - 99 mg/dL   Comment 1 Notify RN    Comment 2 Document in Chart   Glucose, capillary     Status: Abnormal   Collection Time: 05/31/18  5:02 PM  Result Value Ref Range   Glucose-Capillary 283 (H) 65 - 99 mg/dL  Glucose, capillary     Status: Abnormal   Collection Time: 05/31/18  9:04 PM  Result Value Ref Range   Glucose-Capillary 181 (H) 65 - 99 mg/dL  Glucose, capillary     Status: Abnormal   Collection Time: 05/31/18 10:13 PM  Result Value Ref Range   Glucose-Capillary 147 (H) 65 - 99 mg/dL  Glucose, capillary     Status: None   Collection Time: 06/01/18 12:29 AM  Result Value Ref Range   Glucose-Capillary 71 70 - 99 mg/dL  Basic metabolic panel     Status: Abnormal   Collection Time: 06/01/18  3:44 AM  Result Value Ref Range   Sodium 140 135 - 145 mmol/L   Potassium 3.4 (L) 3.5 - 5.1 mmol/L   Chloride 105 98 - 111 mmol/L   CO2 28 22 - 32 mmol/L   Glucose, Bld 80 70 - 99 mg/dL   BUN 27 (H) 8 - 23 mg/dL   Creatinine, Ser 1.64 (H) 0.61 - 1.24 mg/dL   Calcium 8.5 (L) 8.9 - 10.3 mg/dL   GFR calc non Af Amer 39 (L) >60 mL/min   GFR calc Af Amer 46 (L) >60 mL/min    Comment: (NOTE) The eGFR has been calculated using the CKD EPI equation. This calculation has not been validated in all clinical situations. eGFR's persistently <60 mL/min signify possible Chronic Kidney Disease.    Anion gap 7 5 - 15    Comment: Performed at [facility], [place] [address] [place], [place] [address]  CBC     Status: Abnormal   Collection Time: 06/01/18  3:44 AM  Result Value Ref Range   WBC 9.5 4.0 - 10.5 K/uL   RBC 3.84 (L) 4.22 - 5.81 MIL/uL   Hemoglobin 8.8 (L) 13.0 - 17.0 g/dL    HCT 30.6 (L) 39.0 - 52.0 %   MCV 79.7 78.0 - 100.0 fL   MCH 22.9 (L) 26.0 - 34.0 pg   MCHC 28.8 (L) 30.0 - 36.0 g/dL   RDW 20.7 (H) 11.5 - 15.5 %   Platelets 190 150 - 400 K/uL    Comment: Performed at [facility], [place] [address] [place], [place] [address]  Magnesium     Status: None   Collection Time: 06/01/18  3:44 AM  Result Value Ref Range   Magnesium 1.9 1.7 - 2.4 mg/dL    Comment: Performed at [facility], [place] [address], [place], [place] [address]  Glucose, capillary     Status: None   Collection Time: 06/01/18  4:34 AM  Result Value Ref Range   Glucose-Capillary 80 70 - 99 mg/dL   Comment 1 Notify RN    Comment 2 Document in Chart   Glucose, capillary     Status: Abnormal   Collection Time: 06/01/18  7:53 AM  Result Value Ref Range   Glucose-Capillary 175 (H) 70 - 99 mg/dL  Glucose, capillary     Status:  Abnormal   Collection Time: 06/01/18 11:31 AM  Result Value Ref Range   Glucose-Capillary 156 (H) 70 - 99 mg/dL   Comment 1 Notify RN    Comment 2 Document in Chart     ECG   V-paced at 79 (6/22) - Personally Reviewed  Telemetry   V-paced rhythm - Personally Reviewed  Radiology   Dg Chest 1 View  Result Date: 06/01/2018 CLINICAL DATA:  Hypoxia EXAM: CHEST  1 VIEW COMPARISON:  Chest x-ray of 05/29/2018 FINDINGS: The film is rather light in technique but there may be more opacity medially at the left lung base which could be due to atelectasis or developing pneumonia. Two-view chest x-ray would be helpful. There does appear to have been some improvement in the degree of pulmonary vascular congestion. Cardiomegaly is stable. Pacemaker remains. IMPRESSION: Cannot exclude developing opacity medially at the left lung base. Some improvement in pulmonary vascular congestion. Electronically Signed   By: Ivar Drape M.D.   On: 06/01/2018 13:40    Cardiac Studies   LV EF: 35% -    40%  ------------------------------------------------------------------- Indications:      Aortic stenosis 424.1.  ------------------------------------------------------------------- History:   PMH:  Chronic kidney disease  Atrial fibrillation. Chronic obstructive pulmonary disease.  Risk factors:  Diabetes mellitus. Obese.  ------------------------------------------------------------------- Study Conclusions  - Left ventricle: The cavity size was mildly dilated. There was   moderate concentric hypertrophy. Systolic function was moderately   reduced. The estimated ejection fraction was in the range of 35%   to 40%. The study is not technically sufficient to allow   evaluation of LV diastolic function. - Ventricular septum: Septal motion showed abnormal function,   dyssynergy, and paradox. These changes are consistent with right   ventricular pacing. - Aortic valve: Valve mobility was restricted. There was severe   stenosis. Valve area (VTI): 0.91 cm^2. Valve area (Vmax): 0.95   cm^2. Valve area (Vmean): 0.92 cm^2. - Mitral valve: Severely calcified annulus. Transvalvular velocity   was minimally increased. The findings are consistent with trivial   stenosis. There was mild regurgitation. Valve area by continuity   equation (using LVOT flow): 1.81 cm^2. - Left atrium: The atrium was moderately dilated. - Right ventricle: The cavity size was moderately dilated. Systolic   function was mildly reduced. - Right atrium: The atrium was mildly to moderately dilated. - Pulmonary arteries: Systolic pressure was mildly increased. PA   peak pressure: 34 mm Hg (S).  Impression   Principal Problem:   Severe sepsis (HCC) Active Problems:   A-fib (HCC)   CKD (chronic kidney disease), stage III (HCC)   Cellulitis   Hypoxia   Acute on chronic systolic heart failure (HCC)   Severe aortic stenosis   Recommendation   1. Mr. Overfelt is in acute decompensated CHF - he is volume  overloaded and up several pounds of truncal and LE edema. Currently on low dose diuretic - BP has been soft, however, suspect he will tolerate more aggressive diuresis. Given his CKD3, will need higher dose lasix to reach threshold. Recommend increasing lasix to 40 mg IV BID. Will track output and renal function and may increase to TID tomorrow. Echo shows woresening LVEF - this may be due to chronic RV pacing (septal dyssynergy) or worsening AS which is now severe. Doubt he will be a SAVR candidate - may be a TAVR candidate. I provided some information about the procedure and the Sapien 3 valve for them to review today. Work-up for this likely  as an outpatient once he has resolved his sepsis/cellulitis. Also, as a side note, would stop Lovaza - recent evidence suggests this is not helpful in reducing cardiovascular events or mortality and the rise in LDL by the DHA component, may actually be deleterious. Continue rosuvastatin. BP will not allow HF medications at this time. He is noted to be appropriately DNR.  Thanks for the consultation. Cardiology will follow with you.  Time Spent Directly with Patient:  I have spent a total of 60 minutes with the patient reviewing hospital notes, telemetry, EKGs, labs and examining the patient as well as establishing an assessment and plan that was discussed personally with the patient. > 50% of time was spent in direct patient care.  Length of Stay:  LOS: 2 days   Pixie Casino, MD, [facility], [place] Director of the Advanced Lipid Disorders &  Cardiovascular Risk Reduction Clinic Diplomate of the American Board of Clinical Lipidology Attending Cardiologist  Direct Dial: [phone]  Fax: [phone]  Website:  [email or web address] Ayuub Penley 06/01/2018, 1:53 PM

## 2018-06-01 NOTE — Progress Notes (Signed)
Pharmacist Heart Failure Core Measure Documentation  Assessment: [name] has an EF documented as 35-40% on 05/31/2018 by ECHO.  Rationale: Heart failure patients with left ventricular systolic dysfunction (LVSD) and an EF < 40% should be prescribed an angiotensin converting enzyme inhibitor (ACEI) or angiotensin receptor blocker (ARB) at discharge unless a contraindication is documented in the medical record.  This patient is not currently on an ACEI or ARB for HF.  This note is being placed in the record in order to provide documentation that a contraindication to the use of these agents is present for this encounter.  ACE Inhibitor or Angiotensin Receptor Blocker is contraindicated (specify all that apply)  []   ACEI allergy AND ARB allergy []   Angioedema [x]   Moderate or severe aortic stenosis []   Hyperkalemia [x]   Hypotension []   Renal artery stenosis []   Worsening renal function, preexisting renal disease or dysfunction  Doreene Eland, PharmD, BCPS.   06/01/2018 9:56 AM

## 2018-06-01 NOTE — Progress Notes (Signed)
Hypoglycemic Event  CBG: 40  Treatment: 1/2 amp of D50% (86mL)  Symptoms: lethargy, weakness, pt complains of feeling hot  Follow-up CBG: Time: 2300 CBG Result: 78  Possible Reasons for Event: unknown  Comments/MD notified: reported to pt's primary RN    Allene Dillon

## 2018-06-01 NOTE — Progress Notes (Signed)
PROGRESS NOTE    [name]  ZTI:[id number] DOB: [birth year] DOA: 05/29/2018 PCP: Renato Shin, MD   Brief Narrative: 77 y.o.malewith CHF (EF45-50%), pAF and hx of SSS s/p PPM (pacer-dependent) on eliquis, COPD, DMT2, CKD stage 2-3, and GBS bacteremia in January 2019 and lower extremity cellulitis who presented by EMS after mechanical fall while trying to ambulate to the bathroom. No syncope or loss of consciousness. He has noted increasing right leg warmth and tenderness for the past 2-3 weeks and new blisters that would "pop" with yellowish drainage. For the past week, he has also felt increasing malaise and weakness. Mild increase in dyspnea from baseline. Does not require home O2 at baseline. Lives with wife at home.  Regarding group B strep bacteremia in January 2019: suspected source was lower extremity cellulitis. He has since completed PCN -->ceftriaxone via PICC. TEE was recommended to rule out endocarditis or PPM infection; however team decided against given risk of respiratory decompensation    Assessment & Plan:   Active Problems:   CKD (chronic kidney disease), stage III (HCC)   Severe sepsis (HCC)   Cellulitis  1] bilateral lower extremity cellulitis right more than left patient with history of chronic venous stasis and chronic lower extremity edema and erythema but this edema and erythema  is worse than his usual.  Patient on vancomycin and Rocephin.  Blood cultures are pending.  Patient has not had any fever since admission.  His legs are still red but slightly better.  He still has swelling all the way up to his upper thighs.  He is on Lasix 20 mg IV twice a day.  He does have a soft blood pressure due to which I cannot increase the dose of Lasix at this time. Vital signs are stable afebrile no leukocytosis.  Patient with history of GBS bacteremia.  MRSA PCR positive.  2] acute on chronic CHF exacerbation with ejection fraction 35 to 40%.  Mildly decreased at 45%.  Patient  is severely volume overloaded with this edema extending up to his thigh.  I have placed a consult to Dr. Emiliano Dyer is his cardiologist.  From the weights have documented it looks like he gained over 3.5 to 4 pounds since admission to the hospital.  He is negative by 1660.  3] uncontrolled type 2 diabetes patient on total of 900 units of regular insulin at home.  Patient reported he had to drink Coke last night so his blood sugar go down in the middle of the night.  His lowest blood sugar documented is 71 past midnight.  I will decrease his NovoLog to 90 units 3 times a day and 75 units at bedtime.  Patient was taking U 500 insulin in the past.  But what once his insurance insurance changed  U 500 was no more covered so he remains on regular insulin at home.  He is followed by endocrinologist an outpatient and has a follow-up appointment with the endocrinologist first week of July.  4] chronic paroxysmal atrial fibrillation and sick sinus syndrome status post permanent pacemaker continue Eliquis.  Continue amiodarone, metoprolol  5] severe deconditioning patient lives at home with his wife.  Patient most likely will need skilled nursing facility prior to discharge home.      DVT prophylaxis: Eliquis Code Status: DO NOT RESUSCITATE Family Communication none Disposition Plan: TBD  Consultants: Cardiology  Procedures: None Antimicrobials: Vancomycin Rocephin  Subjective: Feels congested used CPAP last night nebulizers made him feel better.  Complaining of  constipation.  Objective: Vitals:   06/01/18 0415 06/01/18 0620 06/01/18 0752 06/01/18 0800  BP: (!) 121/52 (!) 99/43    Pulse: 73 73    Resp: 17 (!) 21    Temp:    98.3 F (36.8 C)  TempSrc:    Oral  SpO2: 97% 99% 94%   Weight:      Height:        Intake/Output Summary (Last 24 hours) at 06/01/2018 0925 Last data filed at 06/01/2018 0645 Gross per 24 hour  Intake 990 ml  Output 2650 ml  Net -1660 ml   Filed Weights    05/29/18 2136 05/30/18 0200  Weight: (!) 141.5 kg (312 lb) (!) 144.6 kg (318 lb 12.6 oz)    Examination:  General exam: Appears calm and comfortable  Respiratory system: WHEEZING  auscultation. Respiratory effort normal. Cardiovascular system: S1 & S2 heard, RRR. No JVD, murmurs, rubs, gallops or clicks. No pedal edema. Gastrointestinal system: Abdomen is distended, soft and nontender. No organomegaly or masses felt. Normal bowel sounds heard.EDEMA NOTED Central nervous system: Alert and oriented. No focal neurological deficits. Extremities: WARM TO TOUCH,EDEMA UPTO THIGHS AND ABDOMINAL WALL. Skin: No rashes, lesions or ulcers Psychiatry: Judgement and insight appear normal. Mood & affect appropriate.     Data Reviewed: I have personally reviewed following labs and imaging studies  CBC: Recent Labs  Lab 05/29/18 2143 05/29/18 2204 05/30/18 0340 05/31/18 0331 06/01/18 0344  WBC 12.8*  --  11.3* 9.7 9.5  NEUTROABS 10.5*  --   --   --   --   HGB 10.0* 9.5* 8.6* 8.8* 8.8*  HCT 32.9* 28.0* 29.5* 30.2* 30.6*  MCV 77.0*  --  78.5 79.9 79.7  PLT 220  --  190 186 130   Basic Metabolic Panel: Recent Labs  Lab 05/29/18 2143 05/29/18 2204 05/30/18 0340 05/30/18 1849 05/31/18 0331 06/01/18 0344  NA 136 136 138 137 137 140  K 4.2 4.0 3.0* 3.6 3.9 3.4*  CL 100* 98* 103 105 103 105  CO2 25  --  25 25 25 28   GLUCOSE 289* 287* 310* 427* 379* 80  BUN 33* 31* 31* 35* 33* 27*  CREATININE 1.95* 1.80* 1.90* 2.16* 1.94* 1.64*  CALCIUM 8.7*  --  8.2* 8.4* 8.6* 8.5*  MG  --   --   --   --  2.1 1.9   GFR: Estimated Creatinine Clearance: 55.9 mL/min (A) (by C-G formula based on SCr of 1.64 mg/dL (H)). Liver Function Tests: Recent Labs  Lab 05/29/18 2143  AST 60*  ALT 15*  ALKPHOS 92  BILITOT 1.1  PROT 7.6  ALBUMIN 3.2*   No results for input(s): LIPASE, AMYLASE in the last 168 hours. No results for input(s): AMMONIA in the last 168 hours. Coagulation Profile: No results for  input(s): INR, PROTIME in the last 168 hours. Cardiac Enzymes: No results for input(s): CKTOTAL, CKMB, CKMBINDEX, TROPONINI in the last 168 hours. BNP (last 3 results) Recent Labs    10/26/17 1425  PROBNP 1,148*   HbA1C: No results for input(s): HGBA1C in the last 72 hours. CBG: Recent Labs  Lab 05/31/18 1702 05/31/18 2104 05/31/18 2213 06/01/18 0029 06/01/18 0434  GLUCAP 283* 181* 147* 71 80   Lipid Profile: No results for input(s): CHOL, HDL, LDLCALC, TRIG, CHOLHDL, LDLDIRECT in the last 72 hours. Thyroid Function Tests: No results for input(s): TSH, T4TOTAL, FREET4, T3FREE, THYROIDAB in the last 72 hours. Anemia Panel: No results for input(s): VITAMINB12, FOLATE, FERRITIN,  TIBC, IRON, RETICCTPCT in the last 72 hours. Sepsis Labs: Recent Labs  Lab 05/29/18 2151 05/29/18 2341 05/30/18 0537  LATICACIDVEN 4.56* 2.66* 1.3    Recent Results (from the past 240 hour(s))  Blood culture (routine x 2)     Status: None (Preliminary result)   Collection Time: 05/29/18 10:03 PM  Result Value Ref Range Status   Specimen Description   Final    BLOOD RIGHT FOREARM Blood Culture adequate volume BOTTLES DRAWN AEROBIC AND ANAEROBIC Performed at [facility] [address] [place], [place] [address]    Special Requests   Final    NONE Performed at [facility], [place] [address] [place], [place] [address]    Culture   Final    NO GROWTH 1 DAY Performed at [name], [place] [address], [place], [place] [address]    Report Status PENDING  Incomplete  Blood culture (routine x 2)     Status: None (Preliminary result)   Collection Time: 05/29/18 10:03 PM  Result Value Ref Range Status   Specimen Description   Final    BLOOD RIGHT FOREARM Blood Culture adequate volume BOTTLES DRAWN AEROBIC AND ANAEROBIC Performed at [facility] [address], [place], [place] [address]    Special Requests   Final    NONE Performed  at [facility], [place] [address], [place], [place] [address]    Culture   Final    NO GROWTH 1 DAY Performed at [facility], [place] [address], [place], [place] [address]    Report Status PENDING  Incomplete  MRSA PCR Screening     Status: Abnormal   Collection Time: 05/30/18  2:00 AM  Result Value Ref Range Status   MRSA by PCR POSITIVE (A) NEGATIVE Final    Comment:        The GeneXpert MRSA Assay (FDA approved for NASAL specimens only), is one component of a comprehensive MRSA colonization surveillance program. It is not intended to diagnose MRSA infection nor to guide or monitor treatment for MRSA infections. RESULT CALLED TO, READ BACK BY AND VERIFIED WITH: Heide Spark [id number] @[id number]  BY V.WILKINS Performed at [facility] [address] [place], [place] [address]          Radiology Studies: No results found.      Scheduled Meds: . acidophilus  1 capsule Oral QPM  . amiodarone  200 mg Oral Daily  . apixaban  2.5 mg Oral BID  . aspirin EC  81 mg Oral Daily  . brimonidine  1 drop Left Eye TID  . Chlorhexidine Gluconate Cloth  6 each Topical Q0600  . dorzolamide  1 drop Both Eyes BID  . fluticasone  2 spray Each Nare Daily  . furosemide  20 mg Intravenous BID  . guaiFENesin  600 mg Oral BID  . insulin aspart  0-15 Units Subcutaneous TID WC  . insulin aspart  0-5 Units Subcutaneous QHS  . insulin aspart  75 Units Subcutaneous QHS  . insulin aspart  90 Units Subcutaneous TID WC  . latanoprost  1 drop Left Eye QHS  . levothyroxine  75 mcg Oral QAC breakfast  . mometasone-formoterol  2 puff Inhalation BID  . mupirocin ointment  1 application Nasal BID  . Netarsudil Dimesylate  1 drop Left Eye QPM  . omega-3 acid ethyl esters  1,000 mg Oral Daily  . pneumococcal 23 valent vaccine  0.5 mL Intramuscular Tomorrow-1000  .  potassium chloride  20 mEq Oral Daily  . rosuvastatin  20 mg Oral QPM  . timolol  1 drop Both Eyes BID    Continuous Infusions: . cefTRIAXone (ROCEPHIN)  IV Stopped (05/31/18 1634)  . magnesium sulfate 1 - 4 g bolus IVPB 2 g (06/01/18 0827)  . vancomycin Stopped (06/01/18 0850)     LOS: 2 days     Georgette Shell, MD [facility] If 7PM-7AM, please contact night-coverage [email or web address] Password [facility] 06/01/2018, 9:25 AM

## 2018-06-01 NOTE — Progress Notes (Signed)
Occupational Therapy Treatment Patient Details Name: [name] MRN: [id number] DOB: 03-11-1941 Today's Date: 06/01/2018    History of present illness This 77 y.o. male admitted after falling in bathroom.  Dx:  likely sepsis due to bil. LE cellulitis, volume overload due to diastolic HF with EF 62%;.  PMH aortic stensosi; DM, morbid obesity, sick sinus syndrom s/p PPM; A-Fib, peripheral neuropathy, renal disease, glaucoma, COPD    OT comments  Pt with improvements in mobility today; able to perform functional mobility in room/to bathroom for toilet transfer with min assist. Pt required standing rest breaks x3 during mobility; encouraged pursed lip breathing. DOE 2/3; SpO2 95-100% on RA throughout. Continue to recommend short term SNF. Will continue to follow acutely.   Follow Up Recommendations  SNF;Supervision - Intermittent    Equipment Recommendations  None recommended by OT    Recommendations for Other Services      Precautions / Restrictions Precautions Precautions: Fall Restrictions Weight Bearing Restrictions: No       Mobility Bed Mobility               General bed mobility comments: Pt OOB in chair upon arrival  Transfers Overall transfer level: Needs assistance Equipment used: Rolling walker (2 wheeled) Transfers: Sit to/from Stand Sit to Stand: Min assist         General transfer comment: Min assist for steadying balance; cues for hand placement and technique with RW    Balance Overall balance assessment: Needs assistance Sitting-balance support: Feet supported Sitting balance-Leahy Scale: Good     Standing balance support: Bilateral upper extremity supported Standing balance-Leahy Scale: Poor Standing balance comment: RW for support                           ADL either performed or assessed with clinical judgement   ADL Overall ADL's : Needs assistance/impaired Eating/Feeding: Independent;Sitting Eating/Feeding Details (indicate  cue type and reason): finishing lunch upon arrival                     Toilet Transfer: Minimal assistance;Ambulation;RW;Comfort height toilet Toilet Transfer Details (indicate cue type and reason): With standing rest breaks throughout functional mobility to bathroom         Functional mobility during ADLs: Minimal assistance;Rolling walker General ADL Comments: Educated pt on pursed lip breathing and standing rest breaks for DOE. DOE 2/4; SpO2 95-100% on RA.     Vision       Perception     Praxis      Cognition Arousal/Alertness: Awake/alert Behavior During Therapy: WFL for tasks assessed/performed Overall Cognitive Status: Within Functional Limits for tasks assessed                                          Exercises     Shoulder Instructions       General Comments      Pertinent Vitals/ Pain       Pain Assessment: No/denies pain  Home Living                                          Prior Functioning/Environment              Frequency  Min 2X/week  Progress Toward Goals  OT Goals(current goals can now be found in the care plan section)  Progress towards OT goals: Progressing toward goals  Acute Rehab OT Goals Patient Stated Goal: to get stronger and be able take care of self  OT Goal Formulation: With patient/family  Plan Discharge plan remains appropriate    Co-evaluation                 AM-PAC PT "6 Clicks" Daily Activity     Outcome Measure   Help from another person eating meals?: None Help from another person taking care of personal grooming?: A Little Help from another person toileting, which includes using toliet, bedpan, or urinal?: A Little Help from another person bathing (including washing, rinsing, drying)?: A Lot Help from another person to put on and taking off regular upper body clothing?: A Little Help from another person to put on and taking off regular lower body  clothing?: A Lot 6 Click Score: 17    End of Session Equipment Utilized During Treatment: Gait belt;Rolling walker  OT Visit Diagnosis: Unsteadiness on feet (R26.81)   Activity Tolerance Patient tolerated treatment well   Patient Left in chair;with call bell/phone within reach;with family/visitor present   Nurse Communication Mobility status        Time: 9718-2099 OT Time Calculation (min): 17 min  Charges: OT General Charges $OT Visit: 1 Visit OT Treatments $Self Care/Home Management : 8-22 mins  Estreya Clay A. Ulice Brilliant, M.S., OTR/L Acute Rehab Department: [phone]   Binnie Kand 06/01/2018, 2:10 PM

## 2018-06-02 ENCOUNTER — Inpatient Hospital Stay (HOSPITAL_COMMUNITY): Payer: Medicare HMO

## 2018-06-02 DIAGNOSIS — N189 Chronic kidney disease, unspecified: Secondary | ICD-10-CM

## 2018-06-02 DIAGNOSIS — J9601 Acute respiratory failure with hypoxia: Secondary | ICD-10-CM

## 2018-06-02 DIAGNOSIS — E08649 Diabetes mellitus due to underlying condition with hypoglycemia without coma: Secondary | ICD-10-CM

## 2018-06-02 DIAGNOSIS — N179 Acute kidney failure, unspecified: Secondary | ICD-10-CM

## 2018-06-02 LAB — BASIC METABOLIC PANEL
ANION GAP: 9 (ref 5–15)
BUN: 26 mg/dL — ABNORMAL HIGH (ref 8–23)
CO2: 26 mmol/L (ref 22–32)
Calcium: 8.2 mg/dL — ABNORMAL LOW (ref 8.9–10.3)
Chloride: 106 mmol/L (ref 98–111)
Creatinine, Ser: 1.53 mg/dL — ABNORMAL HIGH (ref 0.61–1.24)
GFR calc non Af Amer: 43 mL/min — ABNORMAL LOW (ref >60)
GFR, EST AFRICAN AMERICAN: 50 mL/min — AB (ref >60)
Glucose, Bld: 134 mg/dL — ABNORMAL HIGH (ref 70–99)
Potassium: 4 mmol/L (ref 3.5–5.1)
SODIUM: 141 mmol/L (ref 135–145)

## 2018-06-02 LAB — GLUCOSE, CAPILLARY
Glucose-Capillary: 134 mg/dL — ABNORMAL HIGH (ref 70–99)
Glucose-Capillary: 139 mg/dL — ABNORMAL HIGH (ref 70–99)
Glucose-Capillary: 153 mg/dL — ABNORMAL HIGH (ref 70–99)
Glucose-Capillary: 154 mg/dL — ABNORMAL HIGH (ref 70–99)
Glucose-Capillary: 167 mg/dL — ABNORMAL HIGH (ref 70–99)
Glucose-Capillary: 223 mg/dL — ABNORMAL HIGH (ref 70–99)
Glucose-Capillary: 302 mg/dL — ABNORMAL HIGH (ref 70–99)
Glucose-Capillary: 67 mg/dL — ABNORMAL LOW (ref 70–99)
Glucose-Capillary: 76 mg/dL (ref 70–99)

## 2018-06-02 LAB — CBC
HEMATOCRIT: 32.8 % — AB (ref 39.0–52.0)
HEMOGLOBIN: 9.3 g/dL — AB (ref 13.0–17.0)
MCH: 22.9 pg — ABNORMAL LOW (ref 26.0–34.0)
MCHC: 28.4 g/dL — ABNORMAL LOW (ref 30.0–36.0)
MCV: 80.6 fL (ref 78.0–100.0)
PLATELETS: 217 10*3/uL (ref 150–400)
RBC: 4.07 MIL/uL — AB (ref 4.22–5.81)
RDW: 21 % — ABNORMAL HIGH (ref 11.5–15.5)
WBC: 7.3 10*3/uL (ref 4.0–10.5)

## 2018-06-02 LAB — BRAIN NATRIURETIC PEPTIDE: B Natriuretic Peptide: 1005.6 pg/mL — ABNORMAL HIGH (ref 0.0–100.0)

## 2018-06-02 LAB — MAGNESIUM: Magnesium: 2.1 mg/dL (ref 1.7–2.4)

## 2018-06-02 MED ORDER — FUROSEMIDE 10 MG/ML IJ SOLN
40.0000 mg | Freq: Three times a day (TID) | INTRAMUSCULAR | Status: DC
  Administered 2018-06-02 – 2018-06-05 (×11): 40 mg via INTRAVENOUS
  Filled 2018-06-02 (×11): qty 4

## 2018-06-02 MED ORDER — INSULIN ASPART 100 UNIT/ML ~~LOC~~ SOLN: 80.0000 [IU] | Freq: Three times a day (TID) | SUBCUTANEOUS | Status: DC

## 2018-06-02 MED ORDER — ACETAMINOPHEN 325 MG PO TABS
650.0000 mg | ORAL_TABLET | Freq: Four times a day (QID) | ORAL | Status: DC | PRN
  Administered 2018-06-02: 650 mg via ORAL
  Filled 2018-06-02: qty 2

## 2018-06-02 MED ORDER — INSULIN ASPART 100 UNIT/ML ~~LOC~~ SOLN: 60.0000 [IU] | Freq: Three times a day (TID) | SUBCUTANEOUS | Status: DC

## 2018-06-02 MED ORDER — INSULIN ASPART 100 UNIT/ML ~~LOC~~ SOLN
0.0000 [IU] | Freq: Three times a day (TID) | SUBCUTANEOUS | Status: DC
  Administered 2018-06-02: 2 [IU] via SUBCUTANEOUS
  Administered 2018-06-02: 7 [IU] via SUBCUTANEOUS
  Administered 2018-06-03 (×2): 3 [IU] via SUBCUTANEOUS
  Administered 2018-06-03: 7 [IU] via SUBCUTANEOUS
  Administered 2018-06-04: 1 [IU] via SUBCUTANEOUS
  Administered 2018-06-04 (×2): 5 [IU] via SUBCUTANEOUS
  Administered 2018-06-05: 3 [IU] via SUBCUTANEOUS

## 2018-06-02 MED ORDER — INSULIN ASPART 100 UNIT/ML ~~LOC~~ SOLN
60.0000 [IU] | Freq: Three times a day (TID) | SUBCUTANEOUS | Status: DC
  Administered 2018-06-02 (×4): 60 [IU] via SUBCUTANEOUS

## 2018-06-02 NOTE — Progress Notes (Addendum)
PROGRESS NOTE    [name]  WUX:[id number] DOB: Jul 16, 1941 DOA: 05/29/2018 PCP: Renato Shin, MD   Brief Narrative: [name] is a 77 y.o. malewith CHF (EF45-50%), pAF and hx of SSS s/p PPM (pacer-dependent) on eliquis, COPD, DMT2, CKD stage 2-3, and GBS bacteremia in January 2019 and lower extremity cellulitis. He presented after a fall and was found to have concern for cellulitis causing sepsis. He was started on IV antibiotics and an Haematologist. He also developed acute heart failure and is currently undergoing IV diuresis.   Assessment & Plan:   Principal Problem:   Severe sepsis ([place]) Active Problems:   A-fib (HCC)   CKD (chronic kidney disease), stage III (HCC)   Cellulitis   Hypoxia   Acute on chronic systolic heart failure (HCC)   Severe aortic stenosis   Severe sepsis Source is thought to be cellulitis. Resolved.  Cellulitis Bilateral legs. Currently on antibiotic therapy. Unna boots on currently. -Continue Vancomycin/Ceftriaxone -Recheck skin on 6/27 with Unna boot change and will possibly transition to oral antibiotics at that time  Acute on chronic systolic heart failure Weight on admission of 312 lbs. BNP of 1000. UOP of 2.5L over last 24 hours with a weight today of  322 lbs, down from 323 lbs yesterday. -Cardiology recommendations: IV lasix -Potassium supplementation while on IV lasix -Daily weights, strict in and out  Acute respiratory failure with hypoxia In setting of acute heart failure. Patient with more dyspnea today. -Chest x-ray -Continue O2 to keep oxygen saturations greater than 90%  Diabetes mellitus, insulin dependent Currently uncontrolled with hyperglycemia and hypoglycemia. On QID dosing of Novolog as an outpatient. Difficult to control his blood sugar inpatient. Not on a long-acting regimen. Blood sugar as low as 40 yesterday. -Decrease total insulin by 20% from yesterdays dosing:  -Novolog 60 units qAC and qHS  -Decrease to SSI  sensitive scale  -Switch to CBG q4 hours  Atrial fibrillation Rate controlled. PPM in place. -Continue amiodarone -Continue Eliquis  Acute kidney injury on CKD stage 3 Baseline creatinine of 1.5, although, creatinine prior to admission was 2.02. 1.95 on admission. Improved to 1.53.  Aortic stenosis Severe on recent Transthoracic Echocardiogram. -Cardiology recommendations  Biatrial enlargement Right ventricular dilation Cardiology management  Hypothyroidism -Continue Synthroid   DVT prophylaxis: Eliquis Code Status:   Code Status: DNR Family Communication: None at bedside Disposition Plan: Discharge pending improvement of respiratory status and transition to oral antibiotics   Consultants:   Cardiology  Procedures:   6/24: Echocardiogram Study Conclusions  - Left ventricle: The cavity size was mildly dilated. There was   moderate concentric hypertrophy. Systolic function was moderately   reduced. The estimated ejection fraction was in the range of 35%   to 40%. The study is not technically sufficient to allow   evaluation of LV diastolic function. - Ventricular septum: Septal motion showed abnormal function,   dyssynergy, and paradox. These changes are consistent with right   ventricular pacing. - Aortic valve: Valve mobility was restricted. There was severe   stenosis. Valve area (VTI): 0.91 cm^2. Valve area (Vmax): 0.95   cm^2. Valve area (Vmean): 0.92 cm^2. - Mitral valve: Severely calcified annulus. Transvalvular velocity   was minimally increased. The findings are consistent with trivial   stenosis. There was mild regurgitation. Valve area by continuity   equation (using LVOT flow): 1.81 cm^2. - Left atrium: The atrium was moderately dilated. - Right ventricle: The cavity size was moderately dilated. Systolic   function  was mildly reduced. - Right atrium: The atrium was mildly to moderately dilated. - Pulmonary arteries: Systolic pressure was mildly  increased. PA   peak pressure: 34 mm Hg (S).  Antimicrobials: 1. Zosyn (6/22>6/23) 2. Vancomycin (6/22>> 3. Ceftriaxone (6/24>>   Subjective: Patient reports dyspnea today. No chest pain. Has a cough for which he feels there is congestion in his chest, but has had no production.  Objective: Vitals:   06/02/18 0000 06/02/18 0015 06/02/18 0400 06/02/18 0544  BP: 118/66  (!) 114/49   Pulse: 70 70 70   Resp: 19 (!) 21 11   Temp:  97.6 F (36.4 C) 98.7 F (37.1 C)   TempSrc:  Oral Axillary   SpO2: 98% 99% 98%   Weight:    (!) 146.5 kg (322 lb 15.6 oz)  Height:        Intake/Output Summary (Last 24 hours) at 06/02/2018 0734 Last data filed at 06/02/2018 0657 Gross per 24 hour  Intake 2110.67 ml  Output 2490 ml  Net -379.33 ml   Filed Weights   05/30/18 0200 06/01/18 1752 06/02/18 0544  Weight: (!) 144.6 kg (318 lb 12.6 oz) (!) 146.6 kg (323 lb 3.1 oz) (!) 146.5 kg (322 lb 15.6 oz)    Examination:  General exam: Appears calm and comfortable Respiratory system: Diminished bilaterally with mild rales R>L, normal respiratory effort. No wheezing. Cardiovascular system: S1 & S2 heard, RRR. 2/6 systolic murmur Gastrointestinal system: Abdomen is significantly distended, soft and nontender. Normal bowel sounds heard. Central nervous system: Alert and oriented. No focal neurological deficits. Extremities: 2+ pitting edema up to thighs bilaterally. No calf tenderness. Unna boots on both LEs Skin: No cyanosis. No rashes. Psychiatry: Judgement and insight appear normal. Mood & affect appropriate.     Data Reviewed: I have personally reviewed following labs and imaging studies  CBC: Recent Labs  Lab 05/29/18 2143 05/29/18 2204 05/30/18 0340 05/31/18 0331 06/01/18 0344 06/02/18 0334  WBC 12.8*  --  11.3* 9.7 9.5 7.3  NEUTROABS 10.5*  --   --   --   --   --   HGB 10.0* 9.5* 8.6* 8.8* 8.8* 9.3*  HCT 32.9* 28.0* 29.5* 30.2* 30.6* 32.8*  MCV 77.0*  --  78.5 79.9 79.7 80.6    PLT 220  --  190 186 190 151   Basic Metabolic Panel: Recent Labs  Lab 05/30/18 0340 05/30/18 1849 05/31/18 0331 06/01/18 0344 06/02/18 0334  NA 138 137 137 140 141  K 3.0* 3.6 3.9 3.4* 4.0  CL 103 105 103 105 106  CO2 25 25 25 28 26   GLUCOSE 310* 427* 379* 80 134*  BUN 31* 35* 33* 27* 26*  CREATININE 1.90* 2.16* 1.94* 1.64* 1.53*  CALCIUM 8.2* 8.4* 8.6* 8.5* 8.2*  MG  --   --  2.1 1.9 2.1   GFR: Estimated Creatinine Clearance: 60.4 mL/min (A) (by C-G formula based on SCr of 1.53 mg/dL (H)). Liver Function Tests: Recent Labs  Lab 05/29/18 2143  AST 60*  ALT 15*  ALKPHOS 92  BILITOT 1.1  PROT 7.6  ALBUMIN 3.2*   No results for input(s): LIPASE, AMYLASE in the last 168 hours. No results for input(s): AMMONIA in the last 168 hours. Coagulation Profile: No results for input(s): INR, PROTIME in the last 168 hours. Cardiac Enzymes: No results for input(s): CKTOTAL, CKMB, CKMBINDEX, TROPONINI in the last 168 hours. BNP (last 3 results) Recent Labs    10/26/17 1425  PROBNP 1,148*   HbA1C:  No results for input(s): HGBA1C in the last 72 hours. CBG: Recent Labs  Lab 06/01/18 2227 06/01/18 2248 06/02/18 0028 06/02/18 0058 06/02/18 0440  GLUCAP 40* 78 67* 76 154*   Lipid Profile: No results for input(s): CHOL, HDL, LDLCALC, TRIG, CHOLHDL, LDLDIRECT in the last 72 hours. Thyroid Function Tests: No results for input(s): TSH, T4TOTAL, FREET4, T3FREE, THYROIDAB in the last 72 hours. Anemia Panel: No results for input(s): VITAMINB12, FOLATE, FERRITIN, TIBC, IRON, RETICCTPCT in the last 72 hours. Sepsis Labs: Recent Labs  Lab 05/29/18 2151 05/29/18 2341 05/30/18 0537  LATICACIDVEN 4.56* 2.66* 1.3    Recent Results (from the past 240 hour(s))  Blood culture (routine x 2)     Status: None (Preliminary result)   Collection Time: 05/29/18 10:03 PM  Result Value Ref Range Status   Specimen Description   Final    BLOOD RIGHT FOREARM Blood Culture adequate volume  BOTTLES DRAWN AEROBIC AND ANAEROBIC Performed at [facility] [address], [place], [place] [address]    Special Requests   Final    NONE Performed at [facility], [place] [address] [place], [place] [address]    Culture   Final    NO GROWTH 2 DAYS Performed at [facility] [address] [place], [place] [address]    Report Status PENDING  Incomplete  Blood culture (routine x 2)     Status: None (Preliminary result)   Collection Time: 05/29/18 10:03 PM  Result Value Ref Range Status   Specimen Description   Final    BLOOD RIGHT FOREARM Blood Culture adequate volume BOTTLES DRAWN AEROBIC AND ANAEROBIC Performed at [facility] [address], [place], [place] [address]    Special Requests   Final    NONE Performed at [facility], [place] [address] [place], [place] [address]    Culture   Final    NO GROWTH 2 DAYS Performed at [facility] [address], [place], [place] [address]    Report Status PENDING  Incomplete  MRSA PCR Screening     Status: Abnormal   Collection Time: 05/30/18  2:00 AM  Result Value Ref Range Status   MRSA by PCR POSITIVE (A) NEGATIVE Final    Comment:        The GeneXpert MRSA Assay (FDA approved for NASAL specimens only), is one component of a comprehensive MRSA colonization surveillance program. It is not intended to diagnose MRSA infection nor to guide or monitor treatment for MRSA infections. RESULT CALLED TO, READ BACK BY AND VERIFIED WITH: Heide Spark [id number] @[id number]  BY V.WILKINS Performed at [facility] [address], [place], [place] [address]          Radiology Studies: Dg Chest 1 View  Result Date: 06/01/2018 CLINICAL DATA:  Hypoxia EXAM: CHEST  1 VIEW COMPARISON:  Chest x-ray of 05/29/2018 FINDINGS: The film is rather light in technique but there may be more opacity medially at the left lung base which could be due  to atelectasis or developing pneumonia. Two-view chest x-ray would be helpful. There does appear to have been some improvement in the degree of pulmonary vascular congestion. Cardiomegaly is stable. Pacemaker remains. IMPRESSION: Cannot exclude developing opacity medially at the left lung base. Some improvement in pulmonary vascular congestion. Electronically Signed   By: Ivar Drape M.D.   On: 06/01/2018 13:40        Scheduled Meds: . acidophilus  1 capsule  Oral QPM  . amiodarone  200 mg Oral Daily  . apixaban  5 mg Oral BID  . aspirin EC  81 mg Oral Daily  . bisacodyl  10 mg Oral Daily  . brimonidine  1 drop Left Eye TID  . Chlorhexidine Gluconate Cloth  6 each Topical Q0600  . dorzolamide  1 drop Both Eyes BID  . fluticasone  2 spray Each Nare Daily  . furosemide  40 mg Intravenous BID  . guaiFENesin  600 mg Oral BID  . insulin aspart  0-15 Units Subcutaneous TID WC  . insulin aspart  0-5 Units Subcutaneous QHS  . insulin aspart  75 Units Subcutaneous QHS  . insulin aspart  80 Units Subcutaneous TID WC  . lactulose  20 g Oral Once  . latanoprost  1 drop Left Eye QHS  . levothyroxine  75 mcg Oral QAC breakfast  . mometasone-formoterol  2 puff Inhalation BID  . mupirocin ointment  1 application Nasal BID  . Netarsudil Dimesylate  1 drop Left Eye QPM  . pneumococcal 23 valent vaccine  0.5 mL Intramuscular Tomorrow-1000  . potassium chloride  20 mEq Oral Daily  . rosuvastatin  20 mg Oral QPM  . timolol  1 drop Both Eyes BID   Continuous Infusions: . cefTRIAXone (ROCEPHIN)  IV Stopped (06/01/18 1412)  . vancomycin 1,000 mg (06/02/18 0604)     LOS: 3 days     Cordelia Poche, MD [facility] 06/02/2018, 7:34 AM Pager: [phone]  If 7PM-7AM, please contact night-coverage [email or web address] 06/02/2018, 7:34 AM

## 2018-06-02 NOTE — Progress Notes (Signed)
Hypoglycemic Event  CBG: 67  Treatment: 15 GM carbohydrate snack  Symptoms: None  Follow-up CBG: Time:0100 CBG Result: 76  Possible Reasons for Event: Unknown  Comments/MD notified:MD paged    [place]

## 2018-06-02 NOTE — Progress Notes (Signed)
Inpatient Diabetes Program Recommendations  AACE/ADA: New Consensus Statement on Inpatient Glycemic Control (2015)  Target Ranges:  Prepandial:   less than 140 mg/dL      Peak postprandial:   less than 180 mg/dL (1-2 hours)      Critically ill patients:  140 - 180 mg/dL   Lab Results  Component Value Date   GLUCAP 223 (H) 06/02/2018   HGBA1C 6.8 03/10/2018    Review of Glycemic Control  CBGs 40 - 223 mg/dL in past 24H. Was started on half home dose insulin, was reduced, and now reduced again by 20%.  Pt is not on any basal insulin at home. Previously on U-500 until insurance co refused to pay for it. Then MD prescribed high doses of Novolin R QID. Pt admits to having lows at home.  Ideally pt would be on basal-bolus insulin.  Lantus 60 units Q24H Novolog 30 units tidwc Continue with s/s.  Continue to follow.  Thank you. Lorenda Peck, RD, LDN, CDE Inpatient Diabetes Coordinator [phone]

## 2018-06-02 NOTE — Progress Notes (Signed)
Pharmacy Antibiotic Note  [name] is a 77 y.o. male admitted on 05/29/2018 with cellulitis.  Pharmacy has been consulted for Vancomycin and Zosyn dosing. Zosyn changed to ceftriaxone.  Patient with BL LE cellulitis which is unusual.  Case discussed with TRH 6/24 and MD concerned about worsening erythema from baseline (h/o venous stasis).    Today, 06/02/2018 Day #4 abx  Renal: SCr WNL  WBC WNL  Afebrile  BCx unrevealing  Plan:  Adjust vancomycin to 1250mg  IV q24h for improved renal function  Continue ceftriaxone at current dose  TRH to evaluate LE when change [employer] 6/27 and will then make decision re: antibiotics  Check vancomycin levels if vancomycin not d/c'd within next 48h  Height: 5\' 10"  (177.8 cm) Weight: (!) 322 lb 15.6 oz (146.5 kg) IBW/kg (Calculated) : 73  Temp (24hrs), Avg:98.2 F (36.8 C), Min:97.6 F (36.4 C), Max:98.7 F (37.1 C)  Recent Labs  Lab 05/29/18 2143 05/29/18 2151  05/29/18 2341 05/30/18 0340 05/30/18 0537 05/30/18 1849 05/31/18 0331 06/01/18 0344 06/02/18 0334  WBC 12.8*  --   --   --  11.3*  --   --  9.7 9.5 7.3  CREATININE 1.95*  --    < >  --  1.90*  --  2.16* 1.94* 1.64* 1.53*  LATICACIDVEN  --  4.56*  --  2.66*  --  1.3  --   --   --   --    < > = values in this interval not displayed.    Estimated Creatinine Clearance: 60.4 mL/min (A) (by C-G formula based on SCr of 1.53 mg/dL (H)).    Allergies  Allergen Reactions  . Actos [Pioglitazone] Swelling  . Timolol Other (See Comments)    Slow heart rate but tolerates Cosopt (dorzolamide-timolol) Pt has a rx for Timolol and uses it for glaucoma     Antimicrobials this admission:  6/22 Vancomycin  >> 6/22 Zosyn  >> 6/24 6/24 ceftriaxone >>  Dose adjustments this admission:  6/26 adj vanco to 1250mg  IV q24h for improved renal fx Microbiology results:  6/22 BCx: NGTD 6/23 MRSA PCR: positive -  Thank you for allowing pharmacy to be a part of this patient's  care.  Doreene Eland, PharmD, BCPS.   Pager: [phone] 06/02/2018 8:09 AM

## 2018-06-02 NOTE — Progress Notes (Signed)
Progress Note  Patient Name: [name] Date of Encounter: 06/02/2018  Primary Cardiologist: Thompson Grayer, MD   Subjective   Patient reported worsening SOB overnight with orthopnea. Breathing mildly improved with sitting upright in chair. He denies chest pain or palpitations.   Inpatient Medications    Scheduled Meds: . acidophilus  1 capsule Oral QPM  . amiodarone  200 mg Oral Daily  . apixaban  5 mg Oral BID  . aspirin EC  81 mg Oral Daily  . bisacodyl  10 mg Oral Daily  . brimonidine  1 drop Left Eye TID  . Chlorhexidine Gluconate Cloth  6 each Topical Q0600  . dorzolamide  1 drop Both Eyes BID  . fluticasone  2 spray Each Nare Daily  . furosemide  40 mg Intravenous BID  . guaiFENesin  600 mg Oral BID  . insulin aspart  0-9 Units Subcutaneous TID WC  . insulin aspart  60 Units Subcutaneous TID WC & HS  . lactulose  20 g Oral Once  . latanoprost  1 drop Left Eye QHS  . levothyroxine  75 mcg Oral QAC breakfast  . mometasone-formoterol  2 puff Inhalation BID  . mupirocin ointment  1 application Nasal BID  . Netarsudil Dimesylate  1 drop Left Eye QPM  . potassium chloride  20 mEq Oral Daily  . rosuvastatin  20 mg Oral QPM  . timolol  1 drop Both Eyes BID   Continuous Infusions: . cefTRIAXone (ROCEPHIN)  IV Stopped (06/01/18 1412)  . vancomycin Stopped (06/02/18 0704)   PRN Meds: calcium carbonate, ipratropium-albuterol, traMADol   Vital Signs    Vitals:   06/02/18 0015 06/02/18 0400 06/02/18 0544 06/02/18 0756  BP:  (!) 114/49    Pulse: 70 70    Resp: (!) 21 11    Temp: 97.6 F (36.4 C) 98.7 F (37.1 C)  98.1 F (36.7 C)  TempSrc: Oral Axillary  Oral  SpO2: 99% 98%    Weight:   (!) 322 lb 15.6 oz (146.5 kg)   Height:        Intake/Output Summary (Last 24 hours) at 06/02/2018 0955 Last data filed at 06/02/2018 0657 Gross per 24 hour  Intake 1160.67 ml  Output 1890 ml  Net -729.33 ml   Filed Weights   05/30/18 0200 06/01/18 1752 06/02/18 0544    Weight: (!) 318 lb 12.6 oz (144.6 kg) (!) 323 lb 3.1 oz (146.6 kg) (!) 322 lb 15.6 oz (146.5 kg)    Telemetry    V-paced - Personally Reviewed  Physical Exam   GEN: Sitting upright in bedside chair in no acute distress.   Neck: difficult to assess JVD given body habitus, no carotid bruits Cardiac: RRR, + systolic murmur, no rubs or gallops.  Respiratory: decreased breath sounds throughout GI: NABS, obese, mild diffuse TTP, distended, soft  MS: LE wrapped, still with 2+ edema; No deformity. Neuro:  Nonfocal, moving all extremities spontaneously Psych: Normal affect   Labs    Chemistry Recent Labs  Lab 05/29/18 2143  05/31/18 0331 06/01/18 0344 06/02/18 0334  NA 136   < > 137 140 141  K 4.2   < > 3.9 3.4* 4.0  CL 100*   < > 103 105 106  CO2 25   < > 25 28 26   GLUCOSE 289*   < > 379* 80 134*  BUN 33*   < > 33* 27* 26*  CREATININE 1.95*   < > 1.94* 1.64* 1.53*  CALCIUM 8.7*   < > 8.6* 8.5* 8.2*  PROT 7.6  --   --   --   --   ALBUMIN 3.2*  --   --   --   --   AST 60*  --   --   --   --   ALT 15*  --   --   --   --   ALKPHOS 92  --   --   --   --   BILITOT 1.1  --   --   --   --   GFRNONAA 32*   < > 32* 39* 43*  GFRAA 37*   < > 37* 46* 50*  ANIONGAP 11   < > 9 7 9    < > = values in this interval not displayed.     Hematology Recent Labs  Lab 05/31/18 0331 06/01/18 0344 06/02/18 0334  WBC 9.7 9.5 7.3  RBC 3.78* 3.84* 4.07*  HGB 8.8* 8.8* 9.3*  HCT 30.2* 30.6* 32.8*  MCV 79.9 79.7 80.6  MCH 23.3* 22.9* 22.9*  MCHC 29.1* 28.8* 28.4*  RDW 20.5* 20.7* 21.0*  PLT 186 190 217    Cardiac EnzymesNo results for input(s): TROPONINI in the last 168 hours. No results for input(s): TROPIPOC in the last 168 hours.   BNP Recent Labs  Lab 05/29/18 2207 06/02/18 0334  BNP 789.6* 1,005.6*     DDimer No results for input(s): DDIMER in the last 168 hours.   Radiology    Dg Chest 1 View  Result Date: 06/01/2018 CLINICAL DATA:  Hypoxia EXAM: CHEST  1 VIEW  COMPARISON:  Chest x-ray of 05/29/2018 FINDINGS: The film is rather light in technique but there may be more opacity medially at the left lung base which could be due to atelectasis or developing pneumonia. Two-view chest x-ray would be helpful. There does appear to have been some improvement in the degree of pulmonary vascular congestion. Cardiomegaly is stable. Pacemaker remains. IMPRESSION: Cannot exclude developing opacity medially at the left lung base. Some improvement in pulmonary vascular congestion. Electronically Signed   By: Ivar Drape M.D.   On: 06/01/2018 13:40    Cardiac Studies   Echocardiogram 05/31/18: Study Conclusions  - Left ventricle: The cavity size was mildly dilated. There was   moderate concentric hypertrophy. Systolic function was moderately   reduced. The estimated ejection fraction was in the range of 35%   to 40%. The study is not technically sufficient to allow   evaluation of LV diastolic function. - Ventricular septum: Septal motion showed abnormal function,   dyssynergy, and paradox. These changes are consistent with right   ventricular pacing. - Aortic valve: Valve mobility was restricted. There was severe   stenosis. Valve area (VTI): 0.91 cm^2. Valve area (Vmax): 0.95   cm^2. Valve area (Vmean): 0.92 cm^2. - Mitral valve: Severely calcified annulus. Transvalvular velocity   was minimally increased. The findings are consistent with trivial   stenosis. There was mild regurgitation. Valve area by continuity   equation (using LVOT flow): 1.81 cm^2. - Left atrium: The atrium was moderately dilated. - Right ventricle: The cavity size was moderately dilated. Systolic   function was mildly reduced. - Right atrium: The atrium was mildly to moderately dilated. - Pulmonary arteries: Systolic pressure was mildly increased. PA   peak pressure: 34 mm Hg (S).  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  Admitted for cellulitis and found to have worsening LV systolic function and now severe aortic stenosis for which cardiology is following.  Assessment & Plan    1. Acute on chronic combined CHF: Echo this admission with worsening EF to 35-40% (previously 45-50% 07/2017). He was noted to be volume overloaded and started on IV lasix BID. RN/Patient report difficulty capturing UOP given trouble with urinals and condom catheters not staying in place. Multiple instances where urine was spilled. Weight decreased from 323lbs to 322 lbs since yesterday. Diuresis somewhat limited by hypotension - BP stable this morning. Cr improved to 1.53 today. Patient reports worsening dyspnea today and continue LE edema and abdominal distension - Will increased lasix to 40mg  TID and monitor for response - Continue to hold home metoprolol to allow BP room for aggressive diuresis  - Continue to monitor strict I&Os as best as possible.   2. Sepsis: Likely 2/2 cellulitis. Started on IV antibiotics and unna boots placed. - Continue management per primary team  3. Severe Aortic Stenosis: Echo this admission with progression of AS to severe with mean gradient around 41 mmHg and peak gradient of 71 mmHg. Possible he is a candidate for TAVR.  - Will coordinate outpatient follow-up with the structural heart team closer to discharge.   4. Paroxysmal atrial fibrillation: maintaining sinus rhythm with amiodarone - Continue amiodarone for rhythm control - Continue apixaban for CHA2DS2-VASc Score of 6 (CHF, HTN, DM, Vascular, Age 32)  5. SSS s/p PPM: chronic RV pacing. Septal dyssynergy noted on echo, possibly contributing to #1.  - Continue outpatient follow-up with Dr. Rayann Heman      For questions or updates, please contact [facility] Please consult [email or web address] for contact  info under Cardiology/STEMI.      Signed, Abigail Butts, PA-C  06/02/2018, 9:55 AM   [phone]

## 2018-06-02 NOTE — Progress Notes (Signed)
Physical Therapy Treatment Patient Details Name: [name] MRN: [id number] DOB: 03-Aug-1941 Today's Date: 06/02/2018    History of Present Illness This 77 y.o. male admitted after falling in bathroom.  Dx:  likely sepsis due to bil. LE cellulitis, volume overload due to diastolic HF with EF 78%;.  PMH aortic stensosi; DM, morbid obesity, sick sinus syndrom s/p PPM; A-Fib, peripheral neuropathy, renal disease, glaucoma, COPD     PT Comments    Pt tolerated increased activity level today, he ambulated 40' with RW, SaO2 dropped to 88% on room air, HR 60s. SaO2 up to 95% on room air after 1 minute seated rest. Ambulation distance limited by fatigue.  Follow Up Recommendations  SNF;Supervision for mobility/OOB     Equipment Recommendations  None recommended by PT    Recommendations for Other Services       Precautions / Restrictions Precautions Precautions: Fall Precaution Comments: monitor O2 Restrictions Weight Bearing Restrictions: No    Mobility  Bed Mobility Overal bed mobility: Needs Assistance Bed Mobility: Supine to Sit     Supine to sit: HOB elevated;Supervision     General bed mobility comments: HOB up 40*, used rail  Transfers Overall transfer level: Needs assistance Equipment used: Rolling walker (2 wheeled) Transfers: Sit to/from Stand Sit to Stand: Min guard         General transfer comment: min/guard for safety  Ambulation/Gait Ambulation/Gait assistance: Min guard;+2 safety/equipment Gait Distance (Feet): 40 Feet Assistive device: Rolling walker (2 wheeled) Gait Pattern/deviations: Decreased stride length;Step-through pattern     General Gait Details: ambulated with bari RW , distance limited by fatigue, SaO2 88% on room air walking, HR 60s, SaO2 up to 95% on RA after 1 minute seated rest   Stairs             Wheelchair Mobility    Modified Rankin (Stroke Patients Only)       Balance Overall balance assessment: Needs  assistance Sitting-balance support: Feet supported Sitting balance-Leahy Scale: Good     Standing balance support: Bilateral upper extremity supported Standing balance-Leahy Scale: Poor Standing balance comment: RW for support                            Cognition Arousal/Alertness: Awake/alert Behavior During Therapy: WFL for tasks assessed/performed Overall Cognitive Status: Within Functional Limits for tasks assessed                                        Exercises      General Comments        Pertinent Vitals/Pain Pain Assessment: 0-10 Pain Score: 6  Pain Location: back Pain Descriptors / Indicators: Aching Pain Intervention(s): Limited activity within patient's tolerance;Monitored during session;RN gave pain meds during session    Home Living                      Prior Function            PT Goals (current goals can now be found in the care plan section) Acute Rehab PT Goals Patient Stated Goal: to get stronger and be able take care of self  PT Goal Formulation: With patient/family Time For Goal Achievement: 06/14/18 Potential to Achieve Goals: Good Progress towards PT goals: Progressing toward goals    Frequency    Min 2X/week  PT Plan Current plan remains appropriate    Co-evaluation              AM-PAC PT "6 Clicks" Daily Activity  Outcome Measure  Difficulty turning over in bed (including adjusting bedclothes, sheets and blankets)?: A Little Difficulty moving from lying on back to sitting on the side of the bed? : A Little Difficulty sitting down on and standing up from a chair with arms (e.g., wheelchair, bedside commode, etc,.)?: A Little Help needed moving to and from a bed to chair (including a wheelchair)?: A Little Help needed walking in hospital room?: A Little Help needed climbing 3-5 steps with a railing? : Total 6 Click Score: 16    End of Session Equipment Utilized During Treatment:  Gait belt Activity Tolerance: Patient limited by fatigue Patient left: in chair;with call bell/phone within reach;with family/visitor present Nurse Communication: Mobility status PT Visit Diagnosis: History of falling (Z91.81);Difficulty in walking, not elsewhere classified (R26.2);Muscle weakness (generalized) (M62.81)     Time: 1638-4536 PT Time Calculation (min) (ACUTE ONLY): 23 min  Charges:  $Gait Training: 8-22 mins $Therapeutic Activity: 8-22 mins                    G Codes:          Philomena Doheny 06/02/2018, 2:38 PM [phone]

## 2018-06-03 LAB — GLUCOSE, CAPILLARY
GLUCOSE-CAPILLARY: 34 mg/dL — AB (ref 70–99)
GLUCOSE-CAPILLARY: 249 mg/dL — AB (ref 70–99)
GLUCOSE-CAPILLARY: 237 mg/dL — AB (ref 70–99)
Glucose-Capillary: 75 mg/dL (ref 70–99)
Glucose-Capillary: 312 mg/dL — ABNORMAL HIGH (ref 70–99)
Glucose-Capillary: 311 mg/dL — ABNORMAL HIGH (ref 70–99)
Glucose-Capillary: 226 mg/dL — ABNORMAL HIGH (ref 70–99)

## 2018-06-03 LAB — CBC
HEMATOCRIT: 35.4 % — AB (ref 39.0–52.0)
HEMOGLOBIN: 10.1 g/dL — AB (ref 13.0–17.0)
MCH: 23 pg — ABNORMAL LOW (ref 26.0–34.0)
MCHC: 28.5 g/dL — ABNORMAL LOW (ref 30.0–36.0)
MCV: 80.6 fL (ref 78.0–100.0)
Platelets: 249 10*3/uL (ref 150–400)
RBC: 4.39 MIL/uL (ref 4.22–5.81)
RDW: 21.4 % — AB (ref 11.5–15.5)
WBC: 9 10*3/uL (ref 4.0–10.5)

## 2018-06-03 LAB — BASIC METABOLIC PANEL
Anion gap: 7 (ref 5–15)
BUN: 28 mg/dL — ABNORMAL HIGH (ref 8–23)
CO2: 32 mmol/L (ref 22–32)
Calcium: 9 mg/dL (ref 8.9–10.3)
Chloride: 105 mmol/L (ref 98–111)
Creatinine, Ser: 1.7 mg/dL — ABNORMAL HIGH (ref 0.61–1.24)
GFR, EST AFRICAN AMERICAN: 44 mL/min — AB (ref >60)
GFR, EST NON AFRICAN AMERICAN: 38 mL/min — AB (ref >60)
Glucose, Bld: 41 mg/dL — CL (ref 70–99)
POTASSIUM: 3.5 mmol/L (ref 3.5–5.1)
SODIUM: 144 mmol/L (ref 135–145)

## 2018-06-03 LAB — MAGNESIUM: Magnesium: 2.2 mg/dL (ref 1.7–2.4)

## 2018-06-03 MED ORDER — TRAVOPROST (BAK FREE) 0.004 % OP SOLN
1.0000 [drp] | Freq: Every day | OPHTHALMIC | Status: DC
  Administered 2018-06-03 – 2018-06-07 (×4): 1 [drp] via OPHTHALMIC

## 2018-06-03 MED ORDER — INSULIN ASPART 100 UNIT/ML ~~LOC~~ SOLN
40.0000 [IU] | Freq: Every day | SUBCUTANEOUS | Status: DC
  Administered 2018-06-03 – 2018-06-07 (×5): 40 [IU] via SUBCUTANEOUS

## 2018-06-03 MED ORDER — INSULIN ASPART 100 UNIT/ML ~~LOC~~ SOLN
60.0000 [IU] | Freq: Three times a day (TID) | SUBCUTANEOUS | Status: DC
  Administered 2018-06-03 – 2018-06-08 (×17): 60 [IU] via SUBCUTANEOUS

## 2018-06-03 NOTE — Progress Notes (Signed)
PROGRESS NOTE    [name]  QAS:[id number] DOB: [birth year] DOA: 05/29/2018 PCP: Renato Shin, MD   Brief Narrative: [name] is a 77 y.o. malewith CHF (EF45-50%), pAF and hx of SSS s/p PPM (pacer-dependent) on eliquis, COPD, DMT2, CKD stage 2-3, and GBS bacteremia in January 2019 and lower extremity cellulitis. He presented after a fall and was found to have concern for cellulitis causing sepsis. He was started on IV antibiotics and an Haematologist. He also developed acute heart failure and is currently undergoing IV diuresis.   Assessment & Plan:   Principal Problem:   Severe sepsis ([place]) Active Problems:   A-fib (HCC)   CKD (chronic kidney disease), stage III (HCC)   Cellulitis   Hypoxia   Acute on chronic systolic heart failure (HCC)   Severe aortic stenosis   Severe sepsis Source is thought to be cellulitis. Resolved.  Cellulitis Bilateral legs. Currently on antibiotic therapy. Unna boots on currently. -Continue Vancomycin/Ceftriaxone -Recheck skin on today with Unna boot change and will possibly transition to oral antibiotics -PT recommendations: SNF  Acute on chronic systolic heart failure Weight on admission of 312 lbs. BNP of 1000. UOP of 1.9L over last 24 hours (not 100% accurate as there are unmeasured occurrences) with a weight today of  313 lbs, down from 322 lbs yesterday. -Cardiology recommendations: IV lasix -Potassium supplementation while on IV lasix -Daily weights, strict in and out -May need to adjust diuresis in setting of increased creatinine in significantly reduced weight (if accurate)  Acute respiratory failure with hypoxia In setting of acute heart failure. Resolved. On room air.  Diabetes mellitus, insulin dependent Currently uncontrolled with hyperglycemia and hypoglycemia. On QID dosing of Novolog as an outpatient. Difficult to control his blood sugar inpatient. Not on a long-acting regimen. Blood sugar as low early this  morning. -Novolog 60 units qAC and decrease to 40 units qHS -Continue SSI sensitive scale -Continue CBG q4 hours  Atrial fibrillation Rate controlled. PPM in place. -Continue amiodarone -Continue Eliquis  Acute kidney injury on CKD stage 3 Baseline creatinine of 1.5, although, creatinine prior to admission was 2.02. 1.95 on admission. Improved to 1.53 but has now worsened in setting of IV diuresis  Aortic stenosis Severe on recent Transthoracic Echocardiogram. -Cardiology recommendations: outpatient follow-up  Biatrial enlargement Right ventricular dilation Cardiology management  Hypothyroidism -Continue Synthroid   DVT prophylaxis: Eliquis Code Status:   Code Status: DNR Family Communication: None at bedside Disposition Plan: Discharge to SNF pending improvement of heart failure and transition to oral antibiotics   Consultants:   Cardiology  Procedures:   6/24: Echocardiogram Study Conclusions  - Left ventricle: The cavity size was mildly dilated. There was   moderate concentric hypertrophy. Systolic function was moderately   reduced. The estimated ejection fraction was in the range of 35%   to 40%. The study is not technically sufficient to allow   evaluation of LV diastolic function. - Ventricular septum: Septal motion showed abnormal function,   dyssynergy, and paradox. These changes are consistent with right   ventricular pacing. - Aortic valve: Valve mobility was restricted. There was severe   stenosis. Valve area (VTI): 0.91 cm^2. Valve area (Vmax): 0.95   cm^2. Valve area (Vmean): 0.92 cm^2. - Mitral valve: Severely calcified annulus. Transvalvular velocity   was minimally increased. The findings are consistent with trivial   stenosis. There was mild regurgitation. Valve area by continuity   equation (using LVOT flow): 1.81 cm^2. - Left atrium: The atrium  was moderately dilated. - Right ventricle: The cavity size was moderately dilated. Systolic    function was mildly reduced. - Right atrium: The atrium was mildly to moderately dilated. - Pulmonary arteries: Systolic pressure was mildly increased. PA   peak pressure: 34 mm Hg (S).  Antimicrobials: 1. Zosyn (6/22>6/23) 2. Vancomycin (6/22>> 3. Ceftriaxone (6/24>>   Subjective: Patient reports dyspnea today. No chest pain. Has a cough for which he feels there is congestion in his chest, but has had no production.  Objective: Vitals:   06/03/18 0000 06/03/18 0338 06/03/18 0400 06/03/18 0500  BP: (!) 117/46  (!) 110/34   Pulse: 74  77   Resp: 18  20   Temp:  97.6 F (36.4 C)    TempSrc:  Oral    SpO2: 93%  100%   Weight:    (!) 142.2 kg (313 lb 7.9 oz)  Height:        Intake/Output Summary (Last 24 hours) at 06/03/2018 0749 Last data filed at 06/03/2018 0600 Gross per 24 hour  Intake 2128 ml  Output 1950 ml  Net 178 ml   Filed Weights   06/01/18 1752 06/02/18 0544 06/03/18 0500  Weight: (!) 146.6 kg (323 lb 3.1 oz) (!) 146.5 kg (322 lb 15.6 oz) (!) 142.2 kg (313 lb 7.9 oz)    Examination:  General exam: Appears calm and comfortable Respiratory system: Diminished bilaterally. Respiratory effort normal. Cardiovascular system: S1 & S2 heard, RRR. 2/6 systolic murmur Gastrointestinal system: Abdomen is distended, soft and nontender. No organomegaly or masses felt. Normal bowel sounds heard. Central nervous system: Alert and oriented. No focal neurological deficits. Extremities: Pitting edema. No calf tenderness Skin: No cyanosis. No rashes Psychiatry: Judgement and insight appear normal. Mood & affect appropriate.     Data Reviewed: I have personally reviewed following labs and imaging studies  CBC: Recent Labs  Lab 05/29/18 2143  05/30/18 0340 05/31/18 0331 06/01/18 0344 06/02/18 0334 06/03/18 0332  WBC 12.8*  --  11.3* 9.7 9.5 7.3 9.0  NEUTROABS 10.5*  --   --   --   --   --   --   HGB 10.0*   < > 8.6* 8.8* 8.8* 9.3* 10.1*  HCT 32.9*   < > 29.5* 30.2*  30.6* 32.8* 35.4*  MCV 77.0*  --  78.5 79.9 79.7 80.6 80.6  PLT 220  --  190 186 190 217 249   < > = values in this interval not displayed.   Basic Metabolic Panel: Recent Labs  Lab 05/30/18 1849 05/31/18 0331 06/01/18 0344 06/02/18 0334 06/03/18 0332  NA 137 137 140 141 144  K 3.6 3.9 3.4* 4.0 3.5  CL 105 103 105 106 105  CO2 25 25 28 26  32  GLUCOSE 427* 379* 80 134* 41*  BUN 35* 33* 27* 26* 28*  CREATININE 2.16* 1.94* 1.64* 1.53* 1.70*  CALCIUM 8.4* 8.6* 8.5* 8.2* 9.0  MG  --  2.1 1.9 2.1 2.2   GFR: Estimated Creatinine Clearance: 53.5 mL/min (A) (by C-G formula based on SCr of 1.7 mg/dL (H)). Liver Function Tests: Recent Labs  Lab 05/29/18 2143  AST 60*  ALT 15*  ALKPHOS 92  BILITOT 1.1  PROT 7.6  ALBUMIN 3.2*   No results for input(s): LIPASE, AMYLASE in the last 168 hours. No results for input(s): AMMONIA in the last 168 hours. Coagulation Profile: No results for input(s): INR, PROTIME in the last 168 hours. Cardiac Enzymes: No results for input(s): CKTOTAL, CKMB,  CKMBINDEX, TROPONINI in the last 168 hours. BNP (last 3 results) Recent Labs    10/26/17 1425  PROBNP 1,148*   HbA1C: No results for input(s): HGBA1C in the last 72 hours. CBG: Recent Labs  Lab 06/02/18 1955 06/02/18 2236 06/02/18 2318 06/03/18 0320 06/03/18 0355  GLUCAP 167* 139* 134* 34* 75   Lipid Profile: No results for input(s): CHOL, HDL, LDLCALC, TRIG, CHOLHDL, LDLDIRECT in the last 72 hours. Thyroid Function Tests: No results for input(s): TSH, T4TOTAL, FREET4, T3FREE, THYROIDAB in the last 72 hours. Anemia Panel: No results for input(s): VITAMINB12, FOLATE, FERRITIN, TIBC, IRON, RETICCTPCT in the last 72 hours. Sepsis Labs: Recent Labs  Lab 05/29/18 2151 05/29/18 2341 05/30/18 0537  LATICACIDVEN 4.56* 2.66* 1.3    Recent Results (from the past 240 hour(s))  Blood culture (routine x 2)     Status: None (Preliminary result)   Collection Time: 05/29/18 10:03 PM  Result  Value Ref Range Status   Specimen Description   Final    BLOOD RIGHT FOREARM Blood Culture adequate volume BOTTLES DRAWN AEROBIC AND ANAEROBIC Performed at [facility] [address], [place], [place] [address]    Special Requests   Final    NONE Performed at [facility], [place] [address], [place], [place] [address]    Culture   Final    NO GROWTH 3 DAYS Performed at [facility], [place] [address], [place], [place] [address]    Report Status PENDING  Incomplete  Blood culture (routine x 2)     Status: None (Preliminary result)   Collection Time: 05/29/18 10:03 PM  Result Value Ref Range Status   Specimen Description   Final    BLOOD RIGHT FOREARM Blood Culture adequate volume BOTTLES DRAWN AEROBIC AND ANAEROBIC Performed at [facility] [address], [place], [place] [address]    Special Requests   Final    NONE Performed at [facility], [place] [address], [place], [place] [address]    Culture   Final    NO GROWTH 3 DAYS Performed at [facility], [place] [address], [place], [place] [address]    Report Status PENDING  Incomplete  MRSA PCR Screening     Status: Abnormal   Collection Time: 05/30/18  2:00 AM  Result Value Ref Range Status   MRSA by PCR POSITIVE (A) NEGATIVE Final    Comment:        The GeneXpert MRSA Assay (FDA approved for NASAL specimens only), is one component of a comprehensive MRSA colonization surveillance program. It is not intended to diagnose MRSA infection nor to guide or monitor treatment for MRSA infections. RESULT CALLED TO, READ BACK BY AND VERIFIED WITH: J.STRENK,RN [id number] @[phone]  BY V.WILKINS Performed at [facility] [address], [place], [place] [address]          Radiology Studies: Dg Chest 1 View  Result Date: 06/01/2018 CLINICAL DATA:  Hypoxia EXAM: CHEST  1 VIEW COMPARISON:  Chest x-ray of 05/29/2018 FINDINGS: The film  is rather light in technique but there may be more opacity medially at the left lung base which could be due to atelectasis or developing pneumonia. Two-view chest x-ray would be helpful. There does appear to have been some improvement in the degree of pulmonary vascular congestion. Cardiomegaly is stable. Pacemaker remains. IMPRESSION: Cannot exclude developing opacity medially at the left lung base. Some improvement in pulmonary vascular congestion. Electronically Signed  By: Ivar Drape M.D.   On: 06/01/2018 13:40   Dg Chest Port 1 View  Result Date: 06/02/2018 CLINICAL DATA:  Dyspnea EXAM: PORTABLE CHEST 1 VIEW COMPARISON:  06/01/2018 FINDINGS: Cardiac shadow is stable. Pacing device is again seen. Lungs are well aerated bilaterally. Minimal left retrocardiac density is noted. No sizable effusion is seen. No acute bony abnormality is noted. IMPRESSION: Stable left retrocardiac atelectasis/early infiltrate. Electronically Signed   By: Inez Catalina M.D.   On: 06/02/2018 12:51        Scheduled Meds: . acidophilus  1 capsule Oral QPM  . amiodarone  200 mg Oral Daily  . apixaban  5 mg Oral BID  . bisacodyl  10 mg Oral Daily  . brimonidine  1 drop Left Eye TID  . Chlorhexidine Gluconate Cloth  6 each Topical Q0600  . dorzolamide  1 drop Both Eyes BID  . fluticasone  2 spray Each Nare Daily  . furosemide  40 mg Intravenous TID  . guaiFENesin  600 mg Oral BID  . insulin aspart  0-9 Units Subcutaneous TID WC  . insulin aspart  60 Units Subcutaneous TID WC   And  . insulin aspart  40 Units Subcutaneous QHS  . lactulose  20 g Oral Once  . latanoprost  1 drop Left Eye QHS  . levothyroxine  75 mcg Oral QAC breakfast  . mometasone-formoterol  2 puff Inhalation BID  . mupirocin ointment  1 application Nasal BID  . Netarsudil Dimesylate  1 drop Left Eye QPM  . potassium chloride  20 mEq Oral Daily  . rosuvastatin  20 mg Oral QPM  . timolol  1 drop Both Eyes BID   Continuous Infusions: .  cefTRIAXone (ROCEPHIN)  IV Stopped (06/02/18 1400)  . vancomycin 1,000 mg (06/03/18 0557)     LOS: 4 days     Cordelia Poche, MD [facility] 06/03/2018, 7:49 AM Pager: [phone]  If 7PM-7AM, please contact night-coverage [email or web address] 06/03/2018, 7:49 AM

## 2018-06-03 NOTE — Consult Note (Addendum)
   [facility] CM Inpatient Consult   06/03/2018  [name] November 12, 1941 [id number]   Patient screened for [name] Management needs due to increased risk of unplanned readmission score of 31% (extreme).  Spoke with [name] and wife at bedside to discuss [facility] Management program. [name] agreeable and [facility] Care Management written consent obtained. [facility] Care Management folder provided.   Discussed that it appears PT recommends SNF. Discussed that writer will make referral to [facility] team once disposition plans are known.   [name] lives with his wife [name].  Confirms Primary Care MD is [name].  States he sometimes has trouble affording his medications (insulin, eliquis, eye drops). He uses [facility] order pharmacy for some meds and [facility] on [address] for others.  States his wife takes him to MD appointments but it can be challenging at times due to him using a walker.  Confirms best contact number for [name] is [phone]. [name] wife (cell) [phone].  Explained that [facility] will not interfere or replace services provided by home health. [name] reports he has had [name] in the past. However, current disposition plan is not known.  Will make inpatient RNCM aware of bedside visit.  Will await to make [facility] referrals once disposition is known.   [name] Rolling, MSN-Ed, RN,BSN [facility] Liaison [phone]

## 2018-06-03 NOTE — Progress Notes (Signed)
Progress Note  Patient Name: [name] Date of Encounter: 06/03/2018  Primary Cardiologist: Thompson Grayer, MD   Subjective   Patient reports another rough night due to hypoglycemia and SOB. States he feels improved this morning. Denies chest pain or palpitations.   Inpatient Medications    Scheduled Meds: . acidophilus  1 capsule Oral QPM  . amiodarone  200 mg Oral Daily  . apixaban  5 mg Oral BID  . bisacodyl  10 mg Oral Daily  . brimonidine  1 drop Left Eye TID  . Chlorhexidine Gluconate Cloth  6 each Topical Q0600  . dorzolamide  1 drop Both Eyes BID  . fluticasone  2 spray Each Nare Daily  . furosemide  40 mg Intravenous TID  . guaiFENesin  600 mg Oral BID  . insulin aspart  0-9 Units Subcutaneous TID WC  . insulin aspart  60 Units Subcutaneous TID WC   And  . insulin aspart  40 Units Subcutaneous QHS  . lactulose  20 g Oral Once  . latanoprost  1 drop Left Eye QHS  . levothyroxine  75 mcg Oral QAC breakfast  . mometasone-formoterol  2 puff Inhalation BID  . mupirocin ointment  1 application Nasal BID  . Netarsudil Dimesylate  1 drop Left Eye QPM  . potassium chloride  20 mEq Oral Daily  . rosuvastatin  20 mg Oral QPM  . timolol  1 drop Both Eyes BID   Continuous Infusions: . cefTRIAXone (ROCEPHIN)  IV Stopped (06/02/18 1400)  . vancomycin 1,000 mg (06/03/18 0557)   PRN Meds: acetaminophen, calcium carbonate, ipratropium-albuterol, traMADol   Vital Signs    Vitals:   06/03/18 0000 06/03/18 0338 06/03/18 0400 06/03/18 0500  BP: (!) 117/46  (!) 110/34   Pulse: 74  77   Resp: 18  20   Temp:  97.6 F (36.4 C)    TempSrc:  Oral    SpO2: 93%  100%   Weight:    (!) 313 lb 7.9 oz (142.2 kg)  Height:        Intake/Output Summary (Last 24 hours) at 06/03/2018 0806 Last data filed at 06/03/2018 0600 Gross per 24 hour  Intake 2128 ml  Output 1950 ml  Net 178 ml   Filed Weights   06/01/18 1752 06/02/18 0544 06/03/18 0500  Weight: (!) 323 lb 3.1 oz  (146.6 kg) (!) 322 lb 15.6 oz (146.5 kg) (!) 313 lb 7.9 oz (142.2 kg)    Telemetry    V-paced - Personally Reviewed   Physical Exam   GEN: Obese gentleman laying in bed in no acute distress.   Neck: No JVD, no carotid bruits Cardiac: RRR, +murmur, no rubs or gallops.  Respiratory: decreased breath sounds throughout GI: NABS, Soft, obese, nontender, non-distended  MS: 1-2+ LE edema; LE wrapped; No deformity. Neuro:  Nonfocal, moving all extremities spontaneously Psych: Normal affect   Labs    Chemistry Recent Labs  Lab 05/29/18 2143  06/01/18 0344 06/02/18 0334 06/03/18 0332  NA 136   < > 140 141 144  K 4.2   < > 3.4* 4.0 3.5  CL 100*   < > 105 106 105  CO2 25   < > 28 26 32  GLUCOSE 289*   < > 80 134* 41*  BUN 33*   < > 27* 26* 28*  CREATININE 1.95*   < > 1.64* 1.53* 1.70*  CALCIUM 8.7*   < > 8.5* 8.2* 9.0  PROT 7.6  --   --   --   --  ALBUMIN 3.2*  --   --   --   --   AST 60*  --   --   --   --   ALT 15*  --   --   --   --   ALKPHOS 92  --   --   --   --   BILITOT 1.1  --   --   --   --   GFRNONAA 32*   < > 39* 43* 38*  GFRAA 37*   < > 46* 50* 44*  ANIONGAP 11   < > 7 9 7    < > = values in this interval not displayed.     Hematology Recent Labs  Lab 06/01/18 0344 06/02/18 0334 06/03/18 0332  WBC 9.5 7.3 9.0  RBC 3.84* 4.07* 4.39  HGB 8.8* 9.3* 10.1*  HCT 30.6* 32.8* 35.4*  MCV 79.7 80.6 80.6  MCH 22.9* 22.9* 23.0*  MCHC 28.8* 28.4* 28.5*  RDW 20.7* 21.0* 21.4*  PLT 190 217 249    Cardiac EnzymesNo results for input(s): TROPONINI in the last 168 hours. No results for input(s): TROPIPOC in the last 168 hours.   BNP Recent Labs  Lab 05/29/18 2207 06/02/18 0334  BNP 789.6* 1,005.6*     DDimer No results for input(s): DDIMER in the last 168 hours.   Radiology    Dg Chest 1 View  Result Date: 06/01/2018 CLINICAL DATA:  Hypoxia EXAM: CHEST  1 VIEW COMPARISON:  Chest x-ray of 05/29/2018 FINDINGS: The film is rather light in technique but  there may be more opacity medially at the left lung base which could be due to atelectasis or developing pneumonia. Two-view chest x-ray would be helpful. There does appear to have been some improvement in the degree of pulmonary vascular congestion. Cardiomegaly is stable. Pacemaker remains. IMPRESSION: Cannot exclude developing opacity medially at the left lung base. Some improvement in pulmonary vascular congestion. Electronically Signed   By: Ivar Drape M.D.   On: 06/01/2018 13:40   Dg Chest Port 1 View  Result Date: 06/02/2018 CLINICAL DATA:  Dyspnea EXAM: PORTABLE CHEST 1 VIEW COMPARISON:  06/01/2018 FINDINGS: Cardiac shadow is stable. Pacing device is again seen. Lungs are well aerated bilaterally. Minimal left retrocardiac density is noted. No sizable effusion is seen. No acute bony abnormality is noted. IMPRESSION: Stable left retrocardiac atelectasis/early infiltrate. Electronically Signed   By: Inez Catalina M.D.   On: 06/02/2018 12:51    Cardiac Studies   Echocardiogram 05/31/18: Study Conclusions  - Left ventricle: The cavity size was mildly dilated. There was moderate concentric hypertrophy. Systolic function was moderately reduced. The estimated ejection fraction was in the range of 35% to 40%. The study is not technically sufficient to allow evaluation of LV diastolic function. - Ventricular septum: Septal motion showed abnormal function, dyssynergy, and paradox. These changes are consistent with right ventricular pacing. - Aortic valve: Valve mobility was restricted. There was severe stenosis. Valve area (VTI): 0.91 cm^2. Valve area (Vmax): 0.95 cm^2. Valve area (Vmean): 0.92 cm^2. - Mitral valve: Severely calcified annulus. Transvalvular velocity was minimally increased. The findings are consistent with trivial stenosis. There was mild regurgitation. Valve area by continuity equation (using LVOT flow): 1.81 cm^2. - Left atrium: The atrium was  moderately dilated. - Right ventricle: The cavity size was moderately dilated. Systolic function was mildly reduced. - Right atrium: The atrium was mildly to moderately dilated. - Pulmonary arteries: Systolic pressure was mildly increased. PA peak pressure: 34 mm Hg (  S).    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 Admitted for cellulitis and found to have worsening LV systolic function and now severe aortic stenosis for which cardiology is following.  Assessment & Plan    1. Acute on chronic combined CHF: Echo this admission with worsening EF to 35-40% (previously 45-50% 07/2017). He was noted to be volume overloaded and started on IV lasix BID. Foley catheter placed yesterday given trouble with urinals and condom catheters not staying in place. Multiple instances where urine was spilled.  Weight decreased from 322lbs to 313lbs since yesterday althought ?accuracy - patient states he has not had standing weights this admission.  Cr increased to 1.70 today. CXR yesterday without significant change. Given persistent SOB and LE edema, would favor continue IV lasix at current dose tonight despite bump in Cr.  - Continue IV lasix 40mg  TID - anticipate de-escalation in the next 24-48 hours.  - Continue to hold home metoprolol to allow BP room for aggressive diuresis  - Continue to monitor strict I&Os as best as possible.   2. Sepsis: Likely 2/2 cellulitis. Started on IV antibiotics and unna boots placed. - Continue management per primary team  3. Severe Aortic Stenosis: Echo this admission with progression of AS to severe with mean gradient around 41 mmHg and peak gradient of 71 mmHg. Possible he is a candidate for TAVR.  - Will coordinate outpatient follow-up with the structural heart team  closer to discharge.   4. Paroxysmal atrial fibrillation: maintaining sinus rhythm with amiodarone - Continue amiodarone for rhythm control - Continue apixaban for CHA2DS2-VASc Score of 6 (CHF, HTN, DM, Vascular, Age 64)  5. SSS s/p PPM: chronic RV pacing. Septal dyssynergy noted on echo, possibly contributing to #1.  - Continue outpatient follow-up with Dr. Rayann Heman    For questions or updates, please contact [facility] Please consult [email or web address] for contact info under Cardiology/STEMI.      Signed, Abigail Butts, PA-C  06/03/2018, 8:06 AM   [phone]

## 2018-06-03 NOTE — Progress Notes (Signed)
Hypoglycemic Event  CBG: 34  Treatment: 15 GM carbohydrate snack  Symptoms: Sweaty  Follow-up CBG: Time:0355 CBG Result:75  Possible Reasons for Event: Unknown  Comments/MD notified: NP notified via text page    Zakiah Gauthreaux Liz Malady

## 2018-06-03 NOTE — Progress Notes (Signed)
CRITICAL VALUE ALERT  Critical Value:  Blood glucose 41  Date & Time Notied:  06/03/2018 @0416   Provider Notified: NP notified  Orders Received/Actions taken:

## 2018-06-03 NOTE — Progress Notes (Signed)
Occupational Therapy Treatment Patient Details Name: TEL HEVIA MRN: [id number] DOB: 1941/08/08 Today's Date: 06/03/2018    History of present illness This 77 y.o. male admitted after falling in bathroom.  Dx:  likely sepsis due to bil. LE cellulitis, volume overload due to diastolic HF with EF 49%;.  PMH aortic stensosi; DM, morbid obesity, sick sinus syndrom s/p PPM; A-Fib, peripheral neuropathy, renal disease, glaucoma, COPD    OT comments  Pt moved up to medical floor.  ADL already performed. Pt requested to walk:  Min guard given for safety; sats 97% on RA and dyspnea 2/4.  Wife present and they plan to return home after discharge. Will continue to follow  Follow Up Recommendations  Supervision/Assistance - 24 hour    Equipment Recommendations  None recommended by OT    Recommendations for Other Services      Precautions / Restrictions Precautions Precautions: Fall Precaution Comments: monitor O2       Mobility Bed Mobility               General bed mobility comments: in chair  Transfers   Equipment used: Rolling walker (2 wheeled)   Sit to Stand: Min guard         General transfer comment: for safety    Balance                                           ADL either performed or assessed with clinical judgement   ADL                           Toilet Transfer: Min guard;Ambulation;RW(chair)             General ADL Comments: pt has a 4" riser on toilet at home and has sink to push from. Had completed ADL and requested to walk in hall. Min guard Assist, Sats 97% and dyspnea 2/4     Vision       Perception     Praxis      Cognition Arousal/Alertness: Awake/alert Behavior During Therapy: WFL for tasks assessed/performed Overall Cognitive Status: Within Functional Limits for tasks assessed                                          Exercises     Shoulder Instructions       General Comments  wife present.  Pt and wife plan home    Pertinent Vitals/ Pain       Pain Assessment: No/denies pain  Home Living                                          Prior Functioning/Environment              Frequency  Min 2X/week        Progress Toward Goals  OT Goals(current goals can now be found in the care plan section)  Progress towards OT goals: Progressing toward goals     Plan      Co-evaluation                 AM-PAC PT "6 Clicks" Daily  Activity     Outcome Measure   Help from another person eating meals?: None Help from another person taking care of personal grooming?: A Little Help from another person toileting, which includes using toliet, bedpan, or urinal?: A Little Help from another person bathing (including washing, rinsing, drying)?: A Lot Help from another person to put on and taking off regular upper body clothing?: A Little Help from another person to put on and taking off regular lower body clothing?: A Lot 6 Click Score: 17    End of Session    OT Visit Diagnosis: Unsteadiness on feet (R26.81)   Activity Tolerance     Patient Left     Nurse Communication          Time: 0383-3383 OT Time Calculation (min): 18 min  Charges: OT General Charges $OT Visit: 1 Visit OT Treatments $Therapeutic Activity: 8-22 mins  [place], OTR/L [phone] 06/03/2018   Adisa Litt 06/03/2018, 4:00 PM

## 2018-06-04 DIAGNOSIS — L270 Generalized skin eruption due to drugs and medicaments taken internally: Secondary | ICD-10-CM

## 2018-06-04 DIAGNOSIS — L271 Localized skin eruption due to drugs and medicaments taken internally: Secondary | ICD-10-CM

## 2018-06-04 LAB — BASIC METABOLIC PANEL
Anion gap: 11 (ref 5–15)
BUN: 28 mg/dL — ABNORMAL HIGH (ref 8–23)
CO2: 27 mmol/L (ref 22–32)
Calcium: 8.8 mg/dL — ABNORMAL LOW (ref 8.9–10.3)
Chloride: 99 mmol/L (ref 98–111)
Creatinine, Ser: 1.61 mg/dL — ABNORMAL HIGH (ref 0.61–1.24)
GFR calc Af Amer: 47 mL/min — ABNORMAL LOW (ref >60)
GFR, EST NON AFRICAN AMERICAN: 40 mL/min — AB (ref >60)
GLUCOSE: 274 mg/dL — AB (ref 70–99)
POTASSIUM: 4 mmol/L (ref 3.5–5.1)
Sodium: 137 mmol/L (ref 135–145)

## 2018-06-04 LAB — CBC
HEMATOCRIT: 36.7 % — AB (ref 39.0–52.0)
Hemoglobin: 10.2 g/dL — ABNORMAL LOW (ref 13.0–17.0)
MCH: 22.7 pg — ABNORMAL LOW (ref 26.0–34.0)
MCHC: 27.8 g/dL — ABNORMAL LOW (ref 30.0–36.0)
MCV: 81.6 fL (ref 78.0–100.0)
Platelets: 255 10*3/uL (ref 150–400)
RBC: 4.5 MIL/uL (ref 4.22–5.81)
RDW: 21.4 % — ABNORMAL HIGH (ref 11.5–15.5)
WBC: 9.7 10*3/uL (ref 4.0–10.5)

## 2018-06-04 LAB — CULTURE, BLOOD (ROUTINE X 2)
CULTURE: NO GROWTH
CULTURE: NO GROWTH
SPECIAL REQUESTS: ADEQUATE
SPECIAL REQUESTS: ADEQUATE

## 2018-06-04 LAB — MAGNESIUM: Magnesium: 2.1 mg/dL (ref 1.7–2.4)

## 2018-06-04 LAB — GLUCOSE, CAPILLARY
GLUCOSE-CAPILLARY: 264 mg/dL — AB (ref 70–99)
GLUCOSE-CAPILLARY: 279 mg/dL — AB (ref 70–99)
GLUCOSE-CAPILLARY: 237 mg/dL — AB (ref 70–99)
Glucose-Capillary: 117 mg/dL — ABNORMAL HIGH (ref 70–99)
Glucose-Capillary: 137 mg/dL — ABNORMAL HIGH (ref 70–99)
Glucose-Capillary: 145 mg/dL — ABNORMAL HIGH (ref 70–99)

## 2018-06-04 LAB — VANCOMYCIN, PEAK: VANCOMYCIN PK: 27 ug/mL — AB (ref 30–40)

## 2018-06-04 LAB — T4, FREE: FREE T4: 0.95 ng/dL (ref 0.82–1.77)

## 2018-06-04 LAB — TSH: TSH: 3.724 u[IU]/mL (ref 0.350–4.500)

## 2018-06-04 MED ORDER — TRIAMCINOLONE ACETONIDE 0.1 % EX LOTN
TOPICAL_LOTION | Freq: Three times a day (TID) | CUTANEOUS | Status: DC
  Administered 2018-06-04: 23:00:00 via TOPICAL
  Administered 2018-06-04: 1 via TOPICAL
  Administered 2018-06-04 – 2018-06-05 (×4): via TOPICAL
  Administered 2018-06-06: 1 via TOPICAL
  Administered 2018-06-06 – 2018-06-08 (×6): via TOPICAL
  Filled 2018-06-04: qty 60

## 2018-06-04 MED ORDER — METHYLPREDNISOLONE SODIUM SUCC 40 MG IJ SOLR
40.0000 mg | Freq: Every day | INTRAMUSCULAR | Status: DC
  Administered 2018-06-04 – 2018-06-07 (×4): 40 mg via INTRAVENOUS
  Filled 2018-06-04 (×4): qty 1

## 2018-06-04 MED ORDER — HYDROXYZINE HCL 25 MG PO TABS
25.0000 mg | ORAL_TABLET | Freq: Three times a day (TID) | ORAL | Status: DC | PRN
  Administered 2018-06-05 – 2018-06-06 (×2): 25 mg via ORAL
  Filled 2018-06-04 (×2): qty 1

## 2018-06-04 MED ORDER — HYDROCORTISONE 1 % EX CREA
TOPICAL_CREAM | Freq: Two times a day (BID) | CUTANEOUS | Status: DC
  Administered 2018-06-04 (×2): via TOPICAL
  Filled 2018-06-04: qty 28

## 2018-06-04 MED ORDER — HYDROXYZINE HCL 25 MG PO TABS
25.0000 mg | ORAL_TABLET | Freq: Three times a day (TID) | ORAL | Status: AC
  Administered 2018-06-04 (×3): 25 mg via ORAL
  Filled 2018-06-04 (×3): qty 1

## 2018-06-04 MED ORDER — DOXYCYCLINE HYCLATE 100 MG PO TABS
100.0000 mg | ORAL_TABLET | Freq: Two times a day (BID) | ORAL | Status: DC
  Administered 2018-06-04 – 2018-06-08 (×8): 100 mg via ORAL
  Filled 2018-06-04 (×8): qty 1

## 2018-06-04 NOTE — Care Management Note (Signed)
Case Management Note  Patient Details  Name: [name] MRN: [id number] Date of Birth: 03-20-1941  Subjective/Objective:      Pt admitted with severe sepsis              Action/Plan: from home planning to go home   Expected Discharge Date:  06/03/18               Expected Discharge Plan:  [place]  In-House Referral:  Clinical Social Work  Discharge planning Services  CM Consult  Post Acute Care Choice:    Choice offered to:     DME Arranged:    DME Agency:     HH Arranged:  PT, Nurse's Aide, RN [place] Agency:  [place]  Status of Service:  Completed, signed off  If discussed at [place] of Stay Meetings, dates discussed:    Additional CommentsPurcell Mouton, RN 06/04/2018, 2:41 PM

## 2018-06-04 NOTE — Progress Notes (Signed)
PROGRESS NOTE    [name]  ZJI:[id number] DOB: [birth year] DOA: 05/29/2018 PCP: Renato Shin, MD   Brief Narrative: [name] is a 77 y.o. malewith CHF (EF45-50%), pAF and hx of SSS s/p PPM (pacer-dependent) on eliquis, COPD, DMT2, CKD stage 2-3, and GBS bacteremia in January 2019 and lower extremity cellulitis. He presented after a fall and was found to have concern for cellulitis causing sepsis. He was started on IV antibiotics and an Haematologist. He also developed acute heart failure and is currently undergoing IV diuresis.   Assessment & Plan:   Principal Problem:   Severe sepsis ([place]) Active Problems:   A-fib (HCC)   CKD (chronic kidney disease), stage III (HCC)   Cellulitis   Hypoxia   Acute on chronic systolic heart failure (HCC)   Severe aortic stenosis   Severe sepsis Source is thought to be cellulitis. Resolved.  Cellulitis Bilateral legs. Currently on antibiotic therapy. Unna boots placed earlier. Do not see significant evidence of cellulitis today. -Discontinue Vancomycin/Ceftriaxone -Start doxycycline -PT recommendations: SNF, however, patient is refusing. Will plan for home with home health  Acute on chronic systolic heart failure Weight on admission of 312 lbs. BNP of 1000. UOP of 4.8 L over last 24 hours with a weight today of  309 lbs, down from 323 lbs on 6/25. -Cardiology recommendations: Continue IV lasix -Potassium supplementation while on IV lasix -Daily weights, strict in and out  Acute respiratory failure with hypoxia In setting of acute heart failure. Resolved. On room air.  Diabetes mellitus, insulin dependent Currently uncontrolled with hyperglycemia and hypoglycemia. On QID dosing of Novolog as an outpatient. Difficult to control his blood sugar inpatient. Not on a long-acting regimen. Blood sugar as low early this morning. -Novolog 60 units qAC and decrease to 40 units qHS -Continue SSI sensitive scale -Continue CBG q4 hours -Watch  blood sugar while on steroids  Atrial fibrillation Rate controlled. PPM in place. -Continue amiodarone -Continue Eliquis  Acute kidney injury on CKD stage 3 Baseline creatinine of 1.5, although, creatinine prior to admission was 2.02. 1.95 on admission. Improved to 1.53 but has now worsened in setting of IV diuresis  Aortic stenosis Severe on recent Transthoracic Echocardiogram. -Cardiology recommendations: outpatient follow-up  Biatrial enlargement Right ventricular dilation Cardiology follow-up outpatient  Hypothyroidism -Continue Synthroid  Drug rash Most likely diagnosis. Will discontinue antibiotics as mentioned above. -Start atarax scheduled, then transition to PRN tomorrow -solu-medrol 40 mg daily (watch CBGs)   DVT prophylaxis: Eliquis Code Status:   Code Status: DNR Family Communication: Wife at bedside Disposition Plan: Discharge to SNF pending improvement of heart failure and transition to oral antibiotics   Consultants:   Cardiology  Procedures:   6/24: Echocardiogram Study Conclusions  - Left ventricle: The cavity size was mildly dilated. There was   moderate concentric hypertrophy. Systolic function was moderately   reduced. The estimated ejection fraction was in the range of 35%   to 40%. The study is not technically sufficient to allow   evaluation of LV diastolic function. - Ventricular septum: Septal motion showed abnormal function,   dyssynergy, and paradox. These changes are consistent with right   ventricular pacing. - Aortic valve: Valve mobility was restricted. There was severe   stenosis. Valve area (VTI): 0.91 cm^2. Valve area (Vmax): 0.95   cm^2. Valve area (Vmean): 0.92 cm^2. - Mitral valve: Severely calcified annulus. Transvalvular velocity   was minimally increased. The findings are consistent with trivial   stenosis. There was mild  regurgitation. Valve area by continuity   equation (using LVOT flow): 1.81 cm^2. - Left atrium: The  atrium was moderately dilated. - Right ventricle: The cavity size was moderately dilated. Systolic   function was mildly reduced. - Right atrium: The atrium was mildly to moderately dilated. - Pulmonary arteries: Systolic pressure was mildly increased. PA   peak pressure: 34 mm Hg (S).  Antimicrobials: 1. Zosyn (6/22>6/23) 2. Vancomycin (6/22>>6/28) 3. Ceftriaxone (6/24>>6/28) 4. Doxycycline (6/28>>   Subjective: Itching of his back. No dyspnea today.  Objective: Vitals:   06/03/18 2116 06/03/18 2145 06/04/18 0421 06/04/18 0427  BP: 120/62  (!) 106/45   Pulse: 72  69   Resp: 18  18   Temp: 98.9 F (37.2 C)  99.3 F (37.4 C)   TempSrc: Oral  Oral   SpO2: 99% 94% 100%   Weight:    (!) 140.5 kg (309 lb 11.9 oz)  Height:        Intake/Output Summary (Last 24 hours) at 06/04/2018 0846 Last data filed at 06/04/2018 0533 Gross per 24 hour  Intake 1020 ml  Output 4800 ml  Net -3780 ml   Filed Weights   06/02/18 0544 06/03/18 0500 06/04/18 0427  Weight: (!) 146.5 kg (322 lb 15.6 oz) (!) 142.2 kg (313 lb 7.9 oz) (!) 140.5 kg (309 lb 11.9 oz)    Examination:  General exam: Appears calm and comfortable Respiratory system: Clear to auscultation. Respiratory effort normal. Cardiovascular system: S1 & S2 heard, RRR. 2/6 systolic murmur Gastrointestinal system: Abdomen is distended, soft and nontender. Normal bowel sounds heard. Central nervous system: Alert and oriented. No focal neurological deficits. Extremities: pitting edema. No calf tenderness Skin: No cyanosis. Diffuse macular rash on back, left arm and bilateral lower legs with extension up to left lower thigh that is erythematous and blanching. Bilateral venous stasis skin changes of lower extremities. Psychiatry: Judgement and insight appear normal. Mood & affect appropriate.     Data Reviewed: I have personally reviewed following labs and imaging studies  CBC: Recent Labs  Lab 05/29/18 2143  05/31/18 0331  06/01/18 0344 06/02/18 0334 06/03/18 0332 06/04/18 0822  WBC 12.8*   < > 9.7 9.5 7.3 9.0 9.7  NEUTROABS 10.5*  --   --   --   --   --   --   HGB 10.0*   < > 8.8* 8.8* 9.3* 10.1* 10.2*  HCT 32.9*   < > 30.2* 30.6* 32.8* 35.4* 36.7*  MCV 77.0*   < > 79.9 79.7 80.6 80.6 81.6  PLT 220   < > 186 190 217 249 255   < > = values in this interval not displayed.   Basic Metabolic Panel: Recent Labs  Lab 05/30/18 1849 05/31/18 0331 06/01/18 0344 06/02/18 0334 06/03/18 0332 06/04/18 0406  NA 137 137 140 141 144  --   K 3.6 3.9 3.4* 4.0 3.5  --   CL 105 103 105 106 105  --   CO2 25 25 28 26  32  --   GLUCOSE 427* 379* 80 134* 41*  --   BUN 35* 33* 27* 26* 28*  --   CREATININE 2.16* 1.94* 1.64* 1.53* 1.70*  --   CALCIUM 8.4* 8.6* 8.5* 8.2* 9.0  --   MG  --  2.1 1.9 2.1 2.2 2.1   GFR: Estimated Creatinine Clearance: 53.1 mL/min (A) (by C-G formula based on SCr of 1.7 mg/dL (H)). Liver Function Tests: Recent Labs  Lab 05/29/18 2143  AST  60*  ALT 15*  ALKPHOS 92  BILITOT 1.1  PROT 7.6  ALBUMIN 3.2*   No results for input(s): LIPASE, AMYLASE in the last 168 hours. No results for input(s): AMMONIA in the last 168 hours. Coagulation Profile: No results for input(s): INR, PROTIME in the last 168 hours. Cardiac Enzymes: No results for input(s): CKTOTAL, CKMB, CKMBINDEX, TROPONINI in the last 168 hours. BNP (last 3 results) Recent Labs    10/26/17 1425  PROBNP 1,148*   HbA1C: No results for input(s): HGBA1C in the last 72 hours. CBG: Recent Labs  Lab 06/03/18 1843 06/03/18 2118 06/04/18 0013 06/04/18 0422 06/04/18 0736  GLUCAP 226* 237* 117* 137* 264*   Lipid Profile: No results for input(s): CHOL, HDL, LDLCALC, TRIG, CHOLHDL, LDLDIRECT in the last 72 hours. Thyroid Function Tests: No results for input(s): TSH, T4TOTAL, FREET4, T3FREE, THYROIDAB in the last 72 hours. Anemia Panel: No results for input(s): VITAMINB12, FOLATE, FERRITIN, TIBC, IRON, RETICCTPCT in the  last 72 hours. Sepsis Labs: Recent Labs  Lab 05/29/18 2151 05/29/18 2341 05/30/18 0537  LATICACIDVEN 4.56* 2.66* 1.3    Recent Results (from the past 240 hour(s))  Blood culture (routine x 2)     Status: None (Preliminary result)   Collection Time: 05/29/18 10:03 PM  Result Value Ref Range Status   Specimen Description BLOOD RIGHT FOREARM  Final   Special Requests   Final    BOTTLES DRAWN AEROBIC AND ANAEROBIC Blood Culture adequate volume   Culture   Final    NO GROWTH 4 DAYS Performed at [facility], [address] [place], [place] [address]    Report Status PENDING  Incomplete  Blood culture (routine x 2)     Status: None (Preliminary result)   Collection Time: 05/29/18 10:03 PM  Result Value Ref Range Status   Specimen Description BLOOD RIGHT FOREARM  Final   Special Requests   Final    BOTTLES DRAWN AEROBIC AND ANAEROBIC Blood Culture adequate volume   Culture   Final    NO GROWTH 4 DAYS Performed at [facility], [place] [address], [place], [place] [address]    Report Status PENDING  Incomplete  MRSA PCR Screening     Status: Abnormal   Collection Time: 05/30/18  2:00 AM  Result Value Ref Range Status   MRSA by PCR POSITIVE (A) NEGATIVE Final    Comment:        The GeneXpert MRSA Assay (FDA approved for NASAL specimens only), is one component of a comprehensive MRSA colonization surveillance program. It is not intended to diagnose MRSA infection nor to guide or monitor treatment for MRSA infections. RESULT CALLED TO, READ BACK BY AND VERIFIED WITH: Heide Spark [id number] @[id number]  BY V.WILKINS Performed at [facility] [address], [place], [place] [address]          Radiology Studies: Dg Chest Port 1 View  Result Date: 06/02/2018 CLINICAL DATA:  Dyspnea EXAM: PORTABLE CHEST 1 VIEW COMPARISON:  06/01/2018 FINDINGS: Cardiac shadow is stable. Pacing device is again seen. Lungs are well aerated bilaterally. Minimal left  retrocardiac density is noted. No sizable effusion is seen. No acute bony abnormality is noted. IMPRESSION: Stable left retrocardiac atelectasis/early infiltrate. Electronically Signed   By: Inez Catalina M.D.   On: 06/02/2018 12:51        Scheduled Meds: . acidophilus  1 capsule Oral QPM  . amiodarone  200 mg Oral Daily  . apixaban  5 mg Oral BID  .  bisacodyl  10 mg Oral Daily  . brimonidine  1 drop Left Eye TID  . dorzolamide  1 drop Both Eyes BID  . fluticasone  2 spray Each Nare Daily  . furosemide  40 mg Intravenous TID  . guaiFENesin  600 mg Oral BID  . hydrocortisone cream   Topical BID  . hydrOXYzine  25 mg Oral TID  . insulin aspart  0-9 Units Subcutaneous TID WC  . insulin aspart  60 Units Subcutaneous TID WC   And  . insulin aspart  40 Units Subcutaneous QHS  . lactulose  20 g Oral Once  . levothyroxine  75 mcg Oral QAC breakfast  . mometasone-formoterol  2 puff Inhalation BID  . potassium chloride  20 mEq Oral Daily  . rosuvastatin  20 mg Oral QPM  . timolol  1 drop Both Eyes BID  . Travoprost (BAK Free)  1 drop Left Eye QHS   Continuous Infusions: . cefTRIAXone (ROCEPHIN)  IV Stopped (06/03/18 1545)  . vancomycin Stopped (06/04/18 0640)     LOS: 5 days     Cordelia Poche, MD [facility] 06/04/2018, 8:46 AM Pager: [phone]  If 7PM-7AM, please contact night-coverage [email or web address] 06/04/2018, 8:46 AM

## 2018-06-04 NOTE — Progress Notes (Signed)
Progress Note  Patient Name: [name] Date of Encounter: 06/04/2018  Primary Cardiologist: Thompson Grayer, MD  Subjective   DOE remains the same. His #1 complaint is diffuse itching all over his back with widespread papular rash over his back hips and thighs, also an area on his left AC. Nurse states hydrocortisone ordered overnight for which patient reports no relief. Pt tells me doctor was just in and planned to order antihistamine. Foley bag has filled nicely  Inpatient Medications    Scheduled Meds: . acidophilus  1 capsule Oral QPM  . amiodarone  200 mg Oral Daily  . apixaban  5 mg Oral BID  . bisacodyl  10 mg Oral Daily  . brimonidine  1 drop Left Eye TID  . dorzolamide  1 drop Both Eyes BID  . fluticasone  2 spray Each Nare Daily  . furosemide  40 mg Intravenous TID  . guaiFENesin  600 mg Oral BID  . hydrocortisone cream   Topical BID  . insulin aspart  0-9 Units Subcutaneous TID WC  . insulin aspart  60 Units Subcutaneous TID WC   And  . insulin aspart  40 Units Subcutaneous QHS  . lactulose  20 g Oral Once  . levothyroxine  75 mcg Oral QAC breakfast  . mometasone-formoterol  2 puff Inhalation BID  . potassium chloride  20 mEq Oral Daily  . rosuvastatin  20 mg Oral QPM  . timolol  1 drop Both Eyes BID  . Travoprost (BAK Free)  1 drop Left Eye QHS   Continuous Infusions: . cefTRIAXone (ROCEPHIN)  IV Stopped (06/03/18 1545)  . vancomycin Stopped (06/04/18 0640)   PRN Meds: acetaminophen, calcium carbonate, ipratropium-albuterol, traMADol   Vital Signs    Vitals:   06/03/18 2116 06/03/18 2145 06/04/18 0421 06/04/18 0427  BP: 120/62  (!) 106/45   Pulse: 72  69   Resp: 18  18   Temp: 98.9 F (37.2 C)  99.3 F (37.4 C)   TempSrc: Oral  Oral   SpO2: 99% 94% 100%   Weight:    (!) 309 lb 11.9 oz (140.5 kg)  Height:        Intake/Output Summary (Last 24 hours) at 06/04/2018 1856 Last data filed at 06/04/2018 0533 Gross per 24 hour  Intake 1020 ml    Output 4800 ml  Net -3780 ml   Filed Weights   06/02/18 0544 06/03/18 0500 06/04/18 0427  Weight: (!) 322 lb 15.6 oz (146.5 kg) (!) 313 lb 7.9 oz (142.2 kg) (!) 309 lb 11.9 oz (140.5 kg)    Telemetry    AV paced - Personally Reviewed  Physical Exam   GEN: Chronically ill morbidly obese AAM in NAD HEENT: Normocephalic, atraumatic, sclera non-icteric. Neck: No JVD or bruits. Cardiac: RRR no murmurs, rubs, or gallops.  Radials/DP/PT 1+ and equal bilaterally.  Respiratory: Diffusely diminished. Breathing is unlabored on O2 GI: Rounded, distended, +BS MS: no deformity but chronic skin thickening noted on most surfaces. Diffuse noticeable erythematous papular rash on back , lower hips, inner L AC. Not on abdomen, chest Extremities: No clubbing or cyanosis. At least 1+ BLE edema, wrapped surfaces Louretta Parma).  Neuro:  AAOx3. Follows commands. Psych:  Responds to questions appropriately with a normal affect.  Labs    Chemistry Recent Labs  Lab 05/29/18 2143  06/01/18 0344 06/02/18 0334 06/03/18 0332  NA 136   < > 140 141 144  K 4.2   < > 3.4* 4.0 3.5  CL 100*   < > 105 106 105  CO2 25   < > 28 26 32  GLUCOSE 289*   < > 80 134* 41*  BUN 33*   < > 27* 26* 28*  CREATININE 1.95*   < > 1.64* 1.53* 1.70*  CALCIUM 8.7*   < > 8.5* 8.2* 9.0  PROT 7.6  --   --   --   --   ALBUMIN 3.2*  --   --   --   --   AST 60*  --   --   --   --   ALT 15*  --   --   --   --   ALKPHOS 92  --   --   --   --   BILITOT 1.1  --   --   --   --   GFRNONAA 32*   < > 39* 43* 38*  GFRAA 37*   < > 46* 50* 44*  ANIONGAP 11   < > 7 9 7    < > = values in this interval not displayed.     Hematology Recent Labs  Lab 06/01/18 0344 06/02/18 0334 06/03/18 0332  WBC 9.5 7.3 9.0  RBC 3.84* 4.07* 4.39  HGB 8.8* 9.3* 10.1*  HCT 30.6* 32.8* 35.4*  MCV 79.7 80.6 80.6  MCH 22.9* 22.9* 23.0*  MCHC 28.8* 28.4* 28.5*  RDW 20.7* 21.0* 21.4*  PLT 190 217 249    Cardiac EnzymesNo results for input(s): TROPONINI  in the last 168 hours. No results for input(s): TROPIPOC in the last 168 hours.   BNP Recent Labs  Lab 05/29/18 2207 06/02/18 0334  BNP 789.6* 1,005.6*     DDimer No results for input(s): DDIMER in the last 168 hours.   Radiology    Dg Chest Port 1 View  Result Date: 06/02/2018 CLINICAL DATA:  Dyspnea EXAM: PORTABLE CHEST 1 VIEW COMPARISON:  06/01/2018 FINDINGS: Cardiac shadow is stable. Pacing device is again seen. Lungs are well aerated bilaterally. Minimal left retrocardiac density is noted. No sizable effusion is seen. No acute bony abnormality is noted. IMPRESSION: Stable left retrocardiac atelectasis/early infiltrate. Electronically Signed   By: Inez Catalina M.D.   On: 06/02/2018 12:51    Patient Profile     77 yo male patient with history chronic combined systolic and diastolic congestive heart failure, morbid obesity, PAF, anxiety, anemia, bladder cancer, ureteral cancer, gait difficulty, GERD, glaucoma, HTM, HLD, OSA, CKD III, IDDM, coronary artery disease,moderate aortic stenosis and sick sinus syndrome s/p STJ permanent pacemaker placement, hypothyroidism. 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 Admitted for sepsis felt 2/2 cellulitis and acute respiratory failure with hypoxia, found to have worsening LV systolic function (prev EF 45-50% in 07/2017 and now 35-40%), also now severe aortic stenosisfor which cardiology is following. Last cath seen in chart in 2003 showed nonobstructive CAD with 40% LAD, 30% prox RCA, 30-40% distal RCA. I/O's have not been totally accurate due to trouble with urinals and condom caths moving out of place.  Assessment & Plan    1. Acute on chronic combined KYH:CWCBJSEGB to diurese briskly. Cr rose yesterday, no value today. Will order BMET. OP weight when stable was 307 so would continue IV lasix today if Cr stable -> consider de-escalation in dosing given nearing home weight. Home metoprolol on hold to allow for BP room for diuresis, would  resume when able. Hold off ACEI/ARB/Spiro/ARNI this admission given soft BP and AKI on CKD. OP  TAVR workup suggested when patient is euvolemic, may also need to consider ischemic workup but this also will be contingent on his renal function and recovery. He's not describing any chest pain, but DOE persists. This is likely multifactorial for sure.   2.Sepsis:Likely 2/2 cellulitis. Started on IV antibiotics and unna boots placed. Management per primary team. Also of note patient has new papular rash that is quite impressive on his back -> further attention per primary team. Nurse has been in touch with Dr. Lonny Prude.  3.Severe Aortic Stenosis:Echo this admission with progression of AS to severe with mean gradient around41 mmHg and peak gradient of 71 mmHg. Given comorbidities, not likely to be a good candidate for SAVR. Team has determined that outpatient workup for TAVR once recovered from acute illness would be appropriate.   4. Acute kidney injury superimposed on CKD III - peak Cr was 2.16 on 6/23, improved by 6/26 but rose yesterday. Needs BMET today and daily.  5. Paroxysmal atrial fibrillation:maintaining sinus rhythm with amiodarone 200mg  daily. Will update thyroid function given acute CHF this admission, and f/u LFTs given abnormal on admission. Apixaban dose remains appropriate but need to continue to follow creatinine.  6. SSS s/p ENI:DPOEUMP RV pacing. Septal dyssynergy noted on echo, possibly contributing to #1. Outpatient follow-up recommended with Dr. Rayann Heman. Dr. Rayann Heman last saw 05/05/18 and they discussed possible upgrade of PPM to CRT-P but patient wished to avoid procedures at that time. Per note, "He may be willing to allow LV lead placement at time of pacemaker generator change." I'll ask Dr. Debara Pickett to weigh in on if this is something that needs to be reviewed sooner now that LVEF has dropped.  7. Chronic appearing anemia - previous Hgb in 2018 were 11-13 but from 2019 onward he  appears to be in the 8-10 range. Further per IM. Will order hemoccult.   For questions or updates, please contact [place] Please consult [email or web address] for contact info under Cardiology/STEMI.  Signed, Charlie Pitter, PA-C 06/04/2018, 8:08 AM

## 2018-06-04 NOTE — Progress Notes (Signed)
Pt had some concern regarding his blood sugar. The previous night his BS went down to 34. His BS @ 0013 was 117. I gave the pt some peanut butter and graham crackers at 0100 and his 4am BS was 137.

## 2018-06-04 NOTE — Progress Notes (Signed)
Physical Therapy Treatment Patient Details Name: [name] MRN: [id number] DOB: Jan 23, 1941 Today's Date: 06/04/2018    History of Present Illness This 77 y.o. male admitted after falling in bathroom.  Dx:  likely sepsis due to bil. LE cellulitis, volume overload due to diastolic HF with EF 24%;.  PMH aortic stensosi; DM, morbid obesity, sick sinus syndrom s/p PPM; A-Fib, peripheral neuropathy, renal disease, glaucoma, COPD     PT Comments    Pt is progressing with mobility, he tolerated increased ambulation distance of 120' with RW, distance limited by fatigue/dyspnea.    Follow Up Recommendations  SNF;Supervision for mobility/OOB     Equipment Recommendations  None recommended by PT    Recommendations for Other Services       Precautions / Restrictions Precautions Precautions: Fall Precaution Comments: monitor O2 Restrictions Weight Bearing Restrictions: No    Mobility  Bed Mobility               General bed mobility comments: in chair  Transfers   Equipment used: Rolling walker (2 wheeled)   Sit to Stand: Min assist         General transfer comment: min A to rise from recliner, used B armrests  Ambulation/Gait Ambulation/Gait assistance: Min guard Gait Distance (Feet): 120 Feet Assistive device: Rolling walker (2 wheeled) Gait Pattern/deviations: Step-through pattern     General Gait Details: ambulated with bari RW, distance limited by fatigue/SOB, 2/4 dyspnea   Stairs             Wheelchair Mobility    Modified Rankin (Stroke Patients Only)       Balance Overall balance assessment: Needs assistance Sitting-balance support: Feet supported Sitting balance-Leahy Scale: Good     Standing balance support: Bilateral upper extremity supported Standing balance-Leahy Scale: Poor Standing balance comment: RW for support                            Cognition Arousal/Alertness: Awake/alert Behavior During Therapy: WFL for  tasks assessed/performed Overall Cognitive Status: Within Functional Limits for tasks assessed                                        Exercises      General Comments        Pertinent Vitals/Pain Pain Assessment: No/denies pain Pain Location: back is itchy and has a rash -MD aware    Home Living                      Prior Function            PT Goals (current goals can now be found in the care plan section) Acute Rehab PT Goals Patient Stated Goal: to get stronger and be able take care of self  PT Goal Formulation: With patient/family Time For Goal Achievement: 06/14/18 Potential to Achieve Goals: Good    Frequency    Min 2X/week      PT Plan Current plan remains appropriate    Co-evaluation              AM-PAC PT "6 Clicks" Daily Activity  Outcome Measure  Difficulty turning over in bed (including adjusting bedclothes, sheets and blankets)?: A Little Difficulty moving from lying on back to sitting on the side of the bed? : A Little Difficulty sitting down on and standing  up from a chair with arms (e.g., wheelchair, bedside commode, etc,.)?: A Little Help needed moving to and from a bed to chair (including a wheelchair)?: A Little Help needed walking in hospital room?: A Little Help needed climbing 3-5 steps with a railing? : Total 6 Click Score: 16    End of Session Equipment Utilized During Treatment: Gait belt Activity Tolerance: Patient limited by fatigue Patient left: in chair;with call bell/phone within reach;with family/visitor present Nurse Communication: Mobility status PT Visit Diagnosis: History of falling (Z91.81);Difficulty in walking, not elsewhere classified (R26.2);Muscle weakness (generalized) (M62.81)     Time: 1027-2536 PT Time Calculation (min) (ACUTE ONLY): 25 min  Charges:  $Gait Training: 8-22 mins $Therapeutic Activity: 8-22 mins                    G Codes:          Philomena Doheny 06/04/2018, 10:52 AM [phone]

## 2018-06-04 NOTE — Progress Notes (Signed)
I agree with the assessment from the previous nurse.

## 2018-06-04 NOTE — Progress Notes (Signed)
CSW consulted for potential SNF placement assistance.  Met with pt and wife at bedside- discussed therapy recommendations with them. They report they feel they are well equipped to manage pt's care at home (Was independent prior to admission), has equipment needed at home.  Used [facility] for HHPT several months ago and reported having a good experience. Requested referral to [facility] at this DC as well. Notified CM of pt/wife's wishes.  CSW will sign off, please consult if needs arise.   Sharren Bridge, MSW, LCSW Clinical Social Work 06/04/2018 [phone]

## 2018-06-04 NOTE — Care Management Important Message (Signed)
Important Message  Patient Details  Name: COLEBY YETT MRN: [id number] Date of Birth: 1940-12-30   Medicare Important Message Given:  Yes    Kerin Salen 06/04/2018, 10:02 AMImportant Message  Patient Details  Name: SHELTON SQUARE MRN: [id number] Date of Birth: 06-Sep-1941   Medicare Important Message Given:  Yes    Kerin Salen 06/04/2018, 10:02 AM

## 2018-06-04 NOTE — Progress Notes (Signed)
Ortho tech went into patient's room to re-apply unna boots, and family was concerned and stated MD had mentioned not putting the unna boots back on due to extreme redness ("MD said that the redness was worse than when he saw it on Monday"- per patient's family).  Ortho tech held off and made me aware. Sent MD a page and MD called back and stated that he did want to hold off on the unna boots "for now". I mentioned some open spots on patient's legs and MD said it was okay to cover open spots with guaze. Will advise ortho tech and will leave unna boots off until MD orders them back on. VSS. Will continue to monitor.

## 2018-06-05 LAB — HEPATIC FUNCTION PANEL
ALBUMIN: 3.1 g/dL — AB (ref 3.5–5.0)
ALK PHOS: 92 U/L (ref 38–126)
ALT: 23 U/L (ref 0–44)
AST: 27 U/L (ref 15–41)
BILIRUBIN INDIRECT: 0.4 mg/dL (ref 0.3–0.9)
BILIRUBIN TOTAL: 0.5 mg/dL (ref 0.3–1.2)
Bilirubin, Direct: 0.1 mg/dL (ref 0.0–0.2)
Total Protein: 7.6 g/dL (ref 6.5–8.1)

## 2018-06-05 LAB — BASIC METABOLIC PANEL
Anion gap: 11 (ref 5–15)
BUN: 32 mg/dL — ABNORMAL HIGH (ref 8–23)
CHLORIDE: 100 mmol/L (ref 98–111)
CO2: 27 mmol/L (ref 22–32)
Calcium: 9.1 mg/dL (ref 8.9–10.3)
Creatinine, Ser: 1.68 mg/dL — ABNORMAL HIGH (ref 0.61–1.24)
GFR, EST AFRICAN AMERICAN: 44 mL/min — AB (ref >60)
GFR, EST NON AFRICAN AMERICAN: 38 mL/min — AB (ref >60)
GLUCOSE: 195 mg/dL — AB (ref 70–99)
Potassium: 4.5 mmol/L (ref 3.5–5.1)
SODIUM: 138 mmol/L (ref 135–145)

## 2018-06-05 LAB — CBC
HEMATOCRIT: 34.7 % — AB (ref 39.0–52.0)
HEMOGLOBIN: 9.9 g/dL — AB (ref 13.0–17.0)
MCH: 22.8 pg — AB (ref 26.0–34.0)
MCHC: 28.5 g/dL — ABNORMAL LOW (ref 30.0–36.0)
MCV: 79.8 fL (ref 78.0–100.0)
Platelets: 274 10*3/uL (ref 150–400)
RBC: 4.35 MIL/uL (ref 4.22–5.81)
RDW: 21.3 % — ABNORMAL HIGH (ref 11.5–15.5)
WBC: 12.4 10*3/uL — ABNORMAL HIGH (ref 4.0–10.5)

## 2018-06-05 LAB — GLUCOSE, CAPILLARY
GLUCOSE-CAPILLARY: 419 mg/dL — AB (ref 70–99)
GLUCOSE-CAPILLARY: 354 mg/dL — AB (ref 70–99)
GLUCOSE-CAPILLARY: 332 mg/dL — AB (ref 70–99)
Glucose-Capillary: 222 mg/dL — ABNORMAL HIGH (ref 70–99)
Glucose-Capillary: 302 mg/dL — ABNORMAL HIGH (ref 70–99)

## 2018-06-05 MED ORDER — INSULIN ASPART 100 UNIT/ML ~~LOC~~ SOLN
0.0000 [IU] | Freq: Three times a day (TID) | SUBCUTANEOUS | Status: DC
  Administered 2018-06-05: 15 [IU] via SUBCUTANEOUS
  Administered 2018-06-05: 20 [IU] via SUBCUTANEOUS
  Administered 2018-06-06 (×2): 11 [IU] via SUBCUTANEOUS
  Administered 2018-06-06 – 2018-06-07 (×2): 4 [IU] via SUBCUTANEOUS
  Administered 2018-06-07: 11 [IU] via SUBCUTANEOUS
  Administered 2018-06-08: 7 [IU] via SUBCUTANEOUS
  Administered 2018-06-08: 15 [IU] via SUBCUTANEOUS

## 2018-06-05 MED ORDER — INSULIN ASPART 100 UNIT/ML ~~LOC~~ SOLN: 0.0000 [IU] | Freq: Three times a day (TID) | SUBCUTANEOUS | Status: DC

## 2018-06-05 NOTE — Progress Notes (Signed)
PROGRESS NOTE    [name]  UYQ:[id number] DOB: 19-Aug-1941 DOA: 05/29/2018 PCP: Renato Shin, MD   Brief Narrative: [name] is a 77 y.o. malewith CHF (EF45-50%), pAF and hx of SSS s/p PPM (pacer-dependent) on eliquis, COPD, DMT2, CKD stage 2-3, and GBS bacteremia in January 2019 and lower extremity cellulitis. He presented after a fall and was found to have concern for cellulitis causing sepsis. He was started on IV antibiotics and an Haematologist. He also developed acute heart failure and is currently undergoing IV diuresis.   Assessment & Plan:   Principal Problem:   Severe sepsis ([place]) Active Problems:   A-fib (HCC)   CKD (chronic kidney disease), stage III (HCC)   Cellulitis   Hypoxia   Acute on chronic systolic heart failure (HCC)   Severe aortic stenosis   Severe sepsis Source is thought to be cellulitis. Resolved.  Cellulitis Bilateral legs. Currently on antibiotic therapy. Unna boots placed earlier. Do not see significant evidence of cellulitis today. -Discontinue Vancomycin/Ceftriaxone -Start doxycycline -PT recommendations: SNF, however, patient is refusing. Will plan for home with home health  Acute on chronic systolic heart failure Weight on admission of 312 lbs. BNP of 1000. UOP of 1.9 L over last 24 hours (no in/out measured in the last 12 hours though) with a weight yesterday of 309 lbs (no weight today), down from 323 lbs on 6/25. -Cardiology recommendations: Continue IV lasix -Potassium supplementation while on IV lasix -Daily weights, strict in and out  Acute respiratory failure with hypoxia In setting of acute heart failure. Resolved. On room air.  Diabetes mellitus, insulin dependent Currently uncontrolled with hyperglycemia and hypoglycemia. On QID dosing of Novolog as an outpatient. Not on a long-acting regimen. -Novolog 60 units qAC and continue to 40 units qHS -Increase to SSI resistant while on steroids -Watch blood sugar while on  steroids  Atrial fibrillation Rate controlled. PPM in place. -Continue amiodarone -Continue Eliquis  Acute kidney injury on CKD stage 3 Baseline creatinine of 1.5, although, creatinine prior to admission was 2.02. 1.95 on admission. Improved to 1.53 but has now worsened in setting of IV diuresis. Stable currently.  Aortic stenosis Severe on recent Transthoracic Echocardiogram. -Cardiology recommendations: outpatient follow-up  Biatrial enlargement Right ventricular dilation Cardiology follow-up outpatient  Hypothyroidism -Continue Synthroid  Drug rash Most likely diagnosis. Ceftriaxone most likely culprit. -Continue Atarax -Continue Solu-medrol 40 mg daily (watch CBGs)   DVT prophylaxis: Eliquis Code Status:   Code Status: DNR Family Communication: None at bedside Disposition Plan: Discharge to home pending improvement of heart failure and transition to oral antibiotics   Consultants:   Cardiology  Procedures:   6/24: Echocardiogram Study Conclusions  - Left ventricle: The cavity size was mildly dilated. There was   moderate concentric hypertrophy. Systolic function was moderately   reduced. The estimated ejection fraction was in the range of 35%   to 40%. The study is not technically sufficient to allow   evaluation of LV diastolic function. - Ventricular septum: Septal motion showed abnormal function,   dyssynergy, and paradox. These changes are consistent with right   ventricular pacing. - Aortic valve: Valve mobility was restricted. There was severe   stenosis. Valve area (VTI): 0.91 cm^2. Valve area (Vmax): 0.95   cm^2. Valve area (Vmean): 0.92 cm^2. - Mitral valve: Severely calcified annulus. Transvalvular velocity   was minimally increased. The findings are consistent with trivial   stenosis. There was mild regurgitation. Valve area by continuity   equation (using  LVOT flow): 1.81 cm^2. - Left atrium: The atrium was moderately dilated. - Right  ventricle: The cavity size was moderately dilated. Systolic   function was mildly reduced. - Right atrium: The atrium was mildly to moderately dilated. - Pulmonary arteries: Systolic pressure was mildly increased. PA   peak pressure: 34 mm Hg (S).  Antimicrobials: 1. Zosyn (6/22>6/23) 2. Vancomycin (6/22>>6/28) 3. Ceftriaxone (6/24>>6/28) 4. Doxycycline (6/28>>   Subjective: Itching improved. No dyspnea. Ambulating better  Objective: Vitals:   06/04/18 1958 06/04/18 2042 06/05/18 0501 06/05/18 0944  BP:  (!) 116/52 139/60   Pulse:  74 70 75  Resp:  20 20 18   Temp:  98 F (36.7 C) 98.4 F (36.9 C)   TempSrc:  Oral    SpO2: 96% 97% 95% 100%  Weight:      Height:        Intake/Output Summary (Last 24 hours) at 06/05/2018 1221 Last data filed at 06/04/2018 1826 Gross per 24 hour  Intake -  Output 1900 ml  Net -1900 ml   Filed Weights   06/02/18 0544 06/03/18 0500 06/04/18 0427  Weight: (!) 146.5 kg (322 lb 15.6 oz) (!) 142.2 kg (313 lb 7.9 oz) (!) 140.5 kg (309 lb 11.9 oz)    Examination:  General exam: Appears calm and comfortable Respiratory system: Clear to auscultation. Respiratory effort normal. Cardiovascular system: S1 & S2 heard, RRR. 1/6 harsh systolic murmur Gastrointestinal system: Abdomen is non-distended but obese, soft and nontender. Normal bowel sounds heard. Central nervous system: Alert and oriented. No focal neurological deficits. Extremities: No edema. No calf tenderness Skin: No cyanosis. Diffuse macular rash on entire back, left arm, legs, with extension up left thigh Psychiatry: Judgement and insight appear normal. Mood & affect appropriate.     Data Reviewed: I have personally reviewed following labs and imaging studies  CBC: Recent Labs  Lab 05/29/18 2143  06/01/18 0344 06/02/18 0334 06/03/18 0332 06/04/18 0822 06/05/18 0100  WBC 12.8*   < > 9.5 7.3 9.0 9.7 12.4*  NEUTROABS 10.5*  --   --   --   --   --   --   HGB 10.0*   < > 8.8*  9.3* 10.1* 10.2* 9.9*  HCT 32.9*   < > 30.6* 32.8* 35.4* 36.7* 34.7*  MCV 77.0*   < > 79.7 80.6 80.6 81.6 79.8  PLT 220   < > 190 217 249 255 274   < > = values in this interval not displayed.   Basic Metabolic Panel: Recent Labs  Lab 05/31/18 0331 06/01/18 0344 06/02/18 0334 06/03/18 0332 06/04/18 0406 06/04/18 0822 06/05/18 0100  NA 137 140 141 144  --  137 138  K 3.9 3.4* 4.0 3.5  --  4.0 4.5  CL 103 105 106 105  --  99 100  CO2 25 28 26  32  --  27 27  GLUCOSE 379* 80 134* 41*  --  274* 195*  BUN 33* 27* 26* 28*  --  28* 32*  CREATININE 1.94* 1.64* 1.53* 1.70*  --  1.61* 1.68*  CALCIUM 8.6* 8.5* 8.2* 9.0  --  8.8* 9.1  MG 2.1 1.9 2.1 2.2 2.1  --   --    GFR: Estimated Creatinine Clearance: 53.7 mL/min (A) (by C-G formula based on SCr of 1.68 mg/dL (H)). Liver Function Tests: Recent Labs  Lab 05/29/18 2143 06/04/18 2346  AST 60* 27  ALT 15* 23  ALKPHOS 92 92  BILITOT 1.1 0.5  PROT  7.6 7.6  ALBUMIN 3.2* 3.1*   No results for input(s): LIPASE, AMYLASE in the last 168 hours. No results for input(s): AMMONIA in the last 168 hours. Coagulation Profile: No results for input(s): INR, PROTIME in the last 168 hours. Cardiac Enzymes: No results for input(s): CKTOTAL, CKMB, CKMBINDEX, TROPONINI in the last 168 hours. BNP (last 3 results) Recent Labs    10/26/17 1425  PROBNP 1,148*   HbA1C: No results for input(s): HGBA1C in the last 72 hours. CBG: Recent Labs  Lab 06/04/18 1149 06/04/18 1747 06/04/18 2236 06/05/18 0732 06/05/18 1137  GLUCAP 279* 145* 237* 222* 419*   Lipid Profile: No results for input(s): CHOL, HDL, LDLCALC, TRIG, CHOLHDL, LDLDIRECT in the last 72 hours. Thyroid Function Tests: Recent Labs    06/04/18 0918  TSH 3.724  FREET4 0.95   Anemia Panel: No results for input(s): VITAMINB12, FOLATE, FERRITIN, TIBC, IRON, RETICCTPCT in the last 72 hours. Sepsis Labs: Recent Labs  Lab 05/29/18 2151 05/29/18 2341 05/30/18 0537  LATICACIDVEN  4.56* 2.66* 1.3    Recent Results (from the past 240 hour(s))  Blood culture (routine x 2)     Status: None   Collection Time: 05/29/18 10:03 PM  Result Value Ref Range Status   Specimen Description BLOOD RIGHT FOREARM  Final   Special Requests   Final    BOTTLES DRAWN AEROBIC AND ANAEROBIC Blood Culture adequate volume   Culture   Final    NO GROWTH 5 DAYS Performed at [facility], [address], [place], [place] [address]    Report Status 06/04/2018 FINAL  Final  Blood culture (routine x 2)     Status: None   Collection Time: 05/29/18 10:03 PM  Result Value Ref Range Status   Specimen Description BLOOD RIGHT FOREARM  Final   Special Requests   Final    BOTTLES DRAWN AEROBIC AND ANAEROBIC Blood Culture adequate volume   Culture   Final    NO GROWTH 5 DAYS Performed at [facility], [place] [address] [place], [place] [address]    Report Status 06/04/2018 FINAL  Final  MRSA PCR Screening     Status: Abnormal   Collection Time: 05/30/18  2:00 AM  Result Value Ref Range Status   MRSA by PCR POSITIVE (A) NEGATIVE Final    Comment:        The GeneXpert MRSA Assay (FDA approved for NASAL specimens only), is one component of a comprehensive MRSA colonization surveillance program. It is not intended to diagnose MRSA infection nor to guide or monitor treatment for MRSA infections. RESULT CALLED TO, READ BACK BY AND VERIFIED WITH: J.STRENK,RN [id number] @[phone]  BY V.WILKINS Performed at [facility] [address] [place], [place] [address]          Radiology Studies: No results found.      Scheduled Meds: . acidophilus  1 capsule Oral QPM  . amiodarone  200 mg Oral Daily  . apixaban  5 mg Oral BID  . bisacodyl  10 mg Oral Daily  . brimonidine  1 drop Left Eye TID  . dorzolamide  1 drop Both Eyes BID  . doxycycline  100 mg Oral Q12H  . fluticasone  2 spray Each Nare Daily  . furosemide  40 mg Intravenous TID  . guaiFENesin  600 mg  Oral BID  . insulin aspart  0-9 Units Subcutaneous TID WC  . insulin aspart  60 Units Subcutaneous TID WC   And  .  insulin aspart  40 Units Subcutaneous QHS  . lactulose  20 g Oral Once  . levothyroxine  75 mcg Oral QAC breakfast  . methylPREDNISolone (SOLU-MEDROL) injection  40 mg Intravenous Daily  . mometasone-formoterol  2 puff Inhalation BID  . potassium chloride  20 mEq Oral Daily  . rosuvastatin  20 mg Oral QPM  . timolol  1 drop Both Eyes BID  . Travoprost (BAK Free)  1 drop Left Eye QHS  . triamcinolone lotion   Topical TID   Continuous Infusions:    LOS: 6 days     Cordelia Poche, MD [facility] 06/05/2018, 12:21 PM Pager: [phone]  If 7PM-7AM, please contact night-coverage [email or web address] 06/05/2018, 12:21 PM

## 2018-06-05 NOTE — Progress Notes (Signed)
Progress Note  Patient Name: [name] Date of Encounter: 06/05/2018  Primary Cardiologist: Thompson Grayer, MD  Subjective   Ambulated better yesterday. Net negative another 2L overnight - now 6.5L negative. Creatinine essentially stable (1.61->1.68). Weight 309 lbs yesterday - goal dry weight may be around 300 lbs.  Inpatient Medications    Scheduled Meds: . acidophilus  1 capsule Oral QPM  . amiodarone  200 mg Oral Daily  . apixaban  5 mg Oral BID  . bisacodyl  10 mg Oral Daily  . brimonidine  1 drop Left Eye TID  . dorzolamide  1 drop Both Eyes BID  . doxycycline  100 mg Oral Q12H  . fluticasone  2 spray Each Nare Daily  . furosemide  40 mg Intravenous TID  . guaiFENesin  600 mg Oral BID  . insulin aspart  0-9 Units Subcutaneous TID WC  . insulin aspart  60 Units Subcutaneous TID WC   And  . insulin aspart  40 Units Subcutaneous QHS  . lactulose  20 g Oral Once  . levothyroxine  75 mcg Oral QAC breakfast  . methylPREDNISolone (SOLU-MEDROL) injection  40 mg Intravenous Daily  . mometasone-formoterol  2 puff Inhalation BID  . potassium chloride  20 mEq Oral Daily  . rosuvastatin  20 mg Oral QPM  . timolol  1 drop Both Eyes BID  . Travoprost (BAK Free)  1 drop Left Eye QHS  . triamcinolone lotion   Topical TID   Continuous Infusions:  PRN Meds: acetaminophen, calcium carbonate, [COMPLETED] hydrOXYzine **FOLLOWED BY** hydrOXYzine, ipratropium-albuterol, traMADol   Vital Signs    Vitals:   06/04/18 1356 06/04/18 1958 06/04/18 2042 06/05/18 0501  BP: 116/69  (!) 116/52 139/60  Pulse: 73  74 70  Resp: (!) 22  20 20   Temp: 98 F (36.7 C)  98 F (36.7 C) 98.4 F (36.9 C)  TempSrc: Oral  Oral   SpO2:  96% 97% 95%  Weight:      Height:        Intake/Output Summary (Last 24 hours) at 06/05/2018 4098 Last data filed at 06/04/2018 1826 Gross per 24 hour  Intake -  Output 1900 ml  Net -1900 ml   Filed Weights   06/02/18 0544 06/03/18 0500 06/04/18 0427    Weight: (!) 322 lb 15.6 oz (146.5 kg) (!) 313 lb 7.9 oz (142.2 kg) (!) 309 lb 11.9 oz (140.5 kg)    Telemetry    AV paced - Personally Reviewed  Physical Exam   GEN: Chronically ill morbidly obese AAM in NAD HEENT: Normocephalic, atraumatic, sclera non-icteric. Neck: No JVD or bruits. Cardiac: RRR no murmurs, rubs, or gallops.  Radials/DP/PT 1+ and equal bilaterally.  Respiratory: Diffusely diminished. Breathing is unlabored on O2 GI: Rounded, distended, +BS MS: no deformity but chronic skin thickening noted on most surfaces. Diffuse noticeable erythematous papular rash on back , lower hips, inner L AC. Not on abdomen, chest Extremities: No clubbing or cyanosis. 1+ BLE edema Neuro:  AAOx3. Follows commands. Psych: Seems somewhat depressed  Labs    Chemistry Recent Labs  Lab 05/29/18 2143  06/03/18 0332 06/04/18 0822 06/04/18 2346 06/05/18 0100  NA 136   < > 144 137  --  138  K 4.2   < > 3.5 4.0  --  4.5  CL 100*   < > 105 99  --  100  CO2 25   < > 32 27  --  27  GLUCOSE 289*   < >  41* 274*  --  195*  BUN 33*   < > 28* 28*  --  32*  CREATININE 1.95*   < > 1.70* 1.61*  --  1.68*  CALCIUM 8.7*   < > 9.0 8.8*  --  9.1  PROT 7.6  --   --   --  7.6  --   ALBUMIN 3.2*  --   --   --  3.1*  --   AST 60*  --   --   --  27  --   ALT 15*  --   --   --  23  --   ALKPHOS 92  --   --   --  92  --   BILITOT 1.1  --   --   --  0.5  --   GFRNONAA 32*   < > 38* 40*  --  38*  GFRAA 37*   < > 44* 47*  --  44*  ANIONGAP 11   < > 7 11  --  11   < > = values in this interval not displayed.     Hematology Recent Labs  Lab 06/03/18 0332 06/04/18 0822 06/05/18 0100  WBC 9.0 9.7 12.4*  RBC 4.39 4.50 4.35  HGB 10.1* 10.2* 9.9*  HCT 35.4* 36.7* 34.7*  MCV 80.6 81.6 79.8  MCH 23.0* 22.7* 22.8*  MCHC 28.5* 27.8* 28.5*  RDW 21.4* 21.4* 21.3*  PLT 249 255 274    Cardiac EnzymesNo results for input(s): TROPONINI in the last 168 hours. No results for input(s): TROPIPOC in the last  168 hours.   BNP Recent Labs  Lab 05/29/18 2207 06/02/18 0334  BNP 789.6* 1,005.6*     DDimer No results for input(s): DDIMER in the last 168 hours.   Radiology    No results found.  Patient Profile     77 yo male patient with history chronic combined systolic and diastolic congestive heart failure, morbid obesity, PAF, anxiety, anemia, bladder cancer, ureteral cancer, gait difficulty, GERD, glaucoma, HTM, HLD, OSA, CKD III, IDDM, coronary artery disease,moderate aortic stenosis and sick sinus syndrome s/p STJ permanent pacemaker placement, hypothyroidism. 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 Admitted for sepsis felt 2/2 cellulitis and acute respiratory failure with hypoxia, found to have worsening LV systolic function (prev EF 45-50% in 07/2017 and now 35-40%), also now severe aortic stenosisfor which cardiology is following. Last cath seen in chart in 2003 showed nonobstructive CAD with 40% LAD, 30% prox RCA, 30-40% distal RCA. I/O's have not been totally accurate due to trouble with urinals and condom caths moving out of place.  Assessment & Plan    1. Acute on chronic combined CHF:  - continue IV lasix diuresis today  2.Sepsis:Likely 2/2 cellulitis. Started on IV antibiotics and unna boots placed. Management per primary team.    - suspect rash was an FDE. Now switched to oral doxycycline. Rash is less papular today and less pruritic.  3.Severe Aortic Stenosis:Echo this admission with progression of AS to severe with mean gradient around41 mmHg and peak gradient of 71 mmHg. Given comorbidities, not likely to be a good candidate for SAVR. Team has determined that outpatient workup for TAVR once recovered from acute illness would be appropriate.   4. Acute kidney injury superimposed on CKD III - peak Cr was 2.16 on 6/23, improved by 6/26 but rose yesterday. Needs BMET today and daily.  5. Paroxysmal atrial fibrillation:maintaining sinus rhythm with amiodarone  200mg  daily. Will  update thyroid function given acute CHF this admission, and f/u LFTs given abnormal on admission. Apixaban dose remains appropriate but need to continue to follow creatinine.  6. SSS s/p EHO:[id number] RV pacing. Septal dyssynergy noted on echo, possibly contributing to #1. Outpatient follow-up recommended with Dr. Rayann Heman. Dr. Rayann Heman last saw 05/05/18 and they discussed possible upgrade of PPM to CRT-P but patient wished to avoid procedures at that time.  - consider outpatient CRT-P upgrade prior to TAVR if he is agreeable to the procedure  7. Chronic appearing anemia - previous Hgb in 2018 were 11-13 but from 2019 onward he appears to be in the 8-10 range. Further per IM. Hemoccult ordered.  For questions or updates, please contact [place] Please consult [email or web address] for contact info under Cardiology/STEMI.  Pixie Casino, MD, [facility], [place] Director of the [facility] Diplomate of the American Board of Clinical Lipidology Attending Cardiologist  Direct Dial: [phone]  Fax: [phone]  Website:  [email or web address]  Pixie Casino, MD 06/05/2018, 9:23 AM

## 2018-06-06 LAB — BASIC METABOLIC PANEL
ANION GAP: 9 (ref 5–15)
BUN: 44 mg/dL — ABNORMAL HIGH (ref 8–23)
CHLORIDE: 101 mmol/L (ref 98–111)
CO2: 30 mmol/L (ref 22–32)
Calcium: 9.7 mg/dL (ref 8.9–10.3)
Creatinine, Ser: 1.56 mg/dL — ABNORMAL HIGH (ref 0.61–1.24)
GFR calc Af Amer: 48 mL/min — ABNORMAL LOW (ref >60)
GFR calc non Af Amer: 42 mL/min — ABNORMAL LOW (ref >60)
Glucose, Bld: 129 mg/dL — ABNORMAL HIGH (ref 70–99)
POTASSIUM: 4.7 mmol/L (ref 3.5–5.1)
Sodium: 140 mmol/L (ref 135–145)

## 2018-06-06 LAB — CBC
HEMATOCRIT: 34.5 % — AB (ref 39.0–52.0)
HEMOGLOBIN: 10.2 g/dL — AB (ref 13.0–17.0)
MCH: 23.2 pg — AB (ref 26.0–34.0)
MCHC: 29.6 g/dL — ABNORMAL LOW (ref 30.0–36.0)
MCV: 78.4 fL (ref 78.0–100.0)
Platelets: 321 10*3/uL (ref 150–400)
RBC: 4.4 MIL/uL (ref 4.22–5.81)
RDW: 21.3 % — ABNORMAL HIGH (ref 11.5–15.5)
WBC: 16.7 10*3/uL — ABNORMAL HIGH (ref 4.0–10.5)

## 2018-06-06 LAB — GLUCOSE, CAPILLARY
GLUCOSE-CAPILLARY: 160 mg/dL — AB (ref 70–99)
GLUCOSE-CAPILLARY: 206 mg/dL — AB (ref 70–99)
GLUCOSE-CAPILLARY: 331 mg/dL — AB (ref 70–99)
Glucose-Capillary: 125 mg/dL — ABNORMAL HIGH (ref 70–99)
Glucose-Capillary: 295 mg/dL — ABNORMAL HIGH (ref 70–99)
Glucose-Capillary: 263 mg/dL — ABNORMAL HIGH (ref 70–99)

## 2018-06-06 MED ORDER — FUROSEMIDE 10 MG/ML IJ SOLN
40.0000 mg | Freq: Two times a day (BID) | INTRAMUSCULAR | Status: DC
  Administered 2018-06-06 – 2018-06-07 (×2): 40 mg via INTRAVENOUS
  Filled 2018-06-06 (×2): qty 4

## 2018-06-06 NOTE — Progress Notes (Signed)
PROGRESS NOTE    [name]  OEU:[id number] DOB: 22-Oct-1941 DOA: 05/29/2018 PCP: Renato Shin, MD   Brief Narrative: [name] is a 77 y.o. malewith CHF (EF45-50%), pAF and hx of SSS s/p PPM (pacer-dependent) on eliquis, COPD, DMT2, CKD stage 2-3, and GBS bacteremia in January 2019 and lower extremity cellulitis. He presented after a fall and was found to have concern for cellulitis causing sepsis. He was started on IV antibiotics and an Haematologist. He also developed acute heart failure and is currently undergoing IV diuresis.   Assessment & Plan:   Principal Problem:   Severe sepsis ([place]) Active Problems:   A-fib (HCC)   CKD (chronic kidney disease), stage III (HCC)   Cellulitis   Hypoxia   Acute on chronic systolic heart failure (HCC)   Severe aortic stenosis   Severe sepsis Source is thought to be cellulitis. Resolved.  Cellulitis Bilateral legs. Currently on antibiotic therapy. Unna boots placed earlier. Do not see significant evidence of cellulitis today. -Discontinue Vancomycin/Ceftriaxone -Start doxycycline -PT recommendations: SNF, however, patient is refusing. Will plan for home with home health  Acute on chronic systolic heart failure Weight on admission of 312 lbs. BNP of 1000. UOP of 6.2 L over last 24 hours with a weight today of 306 lbs, down from 323 lbs on 6/25. -Cardiology recommendations: Continue IV lasix -Potassium supplementation while on IV lasix -Daily weights, strict in and out  Acute respiratory failure with hypoxia In setting of acute heart failure. Resolved. On room air.  Diabetes mellitus, insulin dependent Better controlled generally. Some significant hyperglycemia but now hypoglycemia. On QID dosing of Novolog as an outpatient. Not on a long-acting regimen. -Novolog 60 units qAC and continue to 40 units qHS -Increase to SSI resistant while on steroids -Watch blood sugar while on steroids  Atrial fibrillation Rate controlled. PPM in  place. -Continue amiodarone -Continue Eliquis  Acute kidney injury on CKD stage 3 Baseline creatinine of 1.5, although, creatinine prior to admission was 2.02. 1.95 on admission. Improved to 1.53 but has now worsened in setting of IV diuresis. Stable currently.  Aortic stenosis Severe on recent Transthoracic Echocardiogram. -Cardiology recommendations: outpatient follow-up  Biatrial enlargement Right ventricular dilation Cardiology follow-up outpatient  Hypothyroidism -Continue Synthroid  Drug rash Most likely diagnosis. Ceftriaxone most likely culprit. Improved slightly -Continue Atarax -Continue Solu-medrol 40 mg daily (watch CBGs)   DVT prophylaxis: Eliquis Code Status:   Code Status: DNR Family Communication: None at bedside Disposition Plan: Discharge to home pending improvement of heart failure and improvement of rash   Consultants:   Cardiology  Procedures:   6/24: Echocardiogram Study Conclusions  - Left ventricle: The cavity size was mildly dilated. There was   moderate concentric hypertrophy. Systolic function was moderately   reduced. The estimated ejection fraction was in the range of 35%   to 40%. The study is not technically sufficient to allow   evaluation of LV diastolic function. - Ventricular septum: Septal motion showed abnormal function,   dyssynergy, and paradox. These changes are consistent with right   ventricular pacing. - Aortic valve: Valve mobility was restricted. There was severe   stenosis. Valve area (VTI): 0.91 cm^2. Valve area (Vmax): 0.95   cm^2. Valve area (Vmean): 0.92 cm^2. - Mitral valve: Severely calcified annulus. Transvalvular velocity   was minimally increased. The findings are consistent with trivial   stenosis. There was mild regurgitation. Valve area by continuity   equation (using LVOT flow): 1.81 cm^2. - Left atrium: The  atrium was moderately dilated. - Right ventricle: The cavity size was moderately dilated.  Systolic   function was mildly reduced. - Right atrium: The atrium was mildly to moderately dilated. - Pulmonary arteries: Systolic pressure was mildly increased. PA   peak pressure: 34 mm Hg (S).  Antimicrobials: 1. Zosyn (6/22>6/23) 2. Vancomycin (6/22>>6/28) 3. Ceftriaxone (6/24>>6/28) 4. Doxycycline (6/28>>   Subjective: A little more itching today.   Objective: Vitals:   06/06/18 0000 06/06/18 0440 06/06/18 0942 06/06/18 0945  BP:  116/63    Pulse:  75    Resp: 18 17    Temp:  97.6 F (36.4 C)    TempSrc:  Oral    SpO2: 100% 99% 98% 98%  Weight:  (!) 139.2 kg (306 lb 14.1 oz)    Height:        Intake/Output Summary (Last 24 hours) at 06/06/2018 1038 Last data filed at 06/06/2018 0449 Gross per 24 hour  Intake -  Output 6200 ml  Net -6200 ml   Filed Weights   06/03/18 0500 06/04/18 0427 06/06/18 0440  Weight: (!) 142.2 kg (313 lb 7.9 oz) (!) 140.5 kg (309 lb 11.9 oz) (!) 139.2 kg (306 lb 14.1 oz)    Examination:  General: Well appearing, no distress Cardiovascular: bilateral LE pitting edema Skin: diffuse macular rash on back, left arm, bilateral LE seems mildly improved. No tenderness of LE    Data Reviewed: I have personally reviewed following labs and imaging studies  CBC: Recent Labs  Lab 06/02/18 0334 06/03/18 0332 06/04/18 0822 06/05/18 0100 06/06/18 0416  WBC 7.3 9.0 9.7 12.4* 16.7*  HGB 9.3* 10.1* 10.2* 9.9* 10.2*  HCT 32.8* 35.4* 36.7* 34.7* 34.5*  MCV 80.6 80.6 81.6 79.8 78.4  PLT 217 249 255 274 314   Basic Metabolic Panel: Recent Labs  Lab 05/31/18 0331 06/01/18 0344 06/02/18 0334 06/03/18 0332 06/04/18 0406 06/04/18 0822 06/05/18 0100 06/06/18 0416  NA 137 140 141 144  --  137 138 140  K 3.9 3.4* 4.0 3.5  --  4.0 4.5 4.7  CL 103 105 106 105  --  99 100 101  CO2 25 28 26  32  --  27 27 30   GLUCOSE 379* 80 134* 41*  --  274* 195* 129*  BUN 33* 27* 26* 28*  --  28* 32* 44*  CREATININE 1.94* 1.64* 1.53* 1.70*  --  1.61*  1.68* 1.56*  CALCIUM 8.6* 8.5* 8.2* 9.0  --  8.8* 9.1 9.7  MG 2.1 1.9 2.1 2.2 2.1  --   --   --    GFR: Estimated Creatinine Clearance: 57.6 mL/min (A) (by C-G formula based on SCr of 1.56 mg/dL (H)). Liver Function Tests: Recent Labs  Lab 06/04/18 2346  AST 27  ALT 23  ALKPHOS 92  BILITOT 0.5  PROT 7.6  ALBUMIN 3.1*   No results for input(s): LIPASE, AMYLASE in the last 168 hours. No results for input(s): AMMONIA in the last 168 hours. Coagulation Profile: No results for input(s): INR, PROTIME in the last 168 hours. Cardiac Enzymes: No results for input(s): CKTOTAL, CKMB, CKMBINDEX, TROPONINI in the last 168 hours. BNP (last 3 results) Recent Labs    10/26/17 1425  PROBNP 1,148*   HbA1C: No results for input(s): HGBA1C in the last 72 hours. CBG: Recent Labs  Lab 06/05/18 1630 06/05/18 2130 06/06/18 0000 06/06/18 0438 06/06/18 0728  GLUCAP 332* 302* 206* 125* 160*   Lipid Profile: No results for input(s): CHOL, HDL, LDLCALC,  TRIG, CHOLHDL, LDLDIRECT in the last 72 hours. Thyroid Function Tests: Recent Labs    06/04/18 0918  TSH 3.724  FREET4 0.95   Anemia Panel: No results for input(s): VITAMINB12, FOLATE, FERRITIN, TIBC, IRON, RETICCTPCT in the last 72 hours. Sepsis Labs: No results for input(s): PROCALCITON, LATICACIDVEN in the last 168 hours.  Recent Results (from the past 240 hour(s))  Blood culture (routine x 2)     Status: None   Collection Time: 05/29/18 10:03 PM  Result Value Ref Range Status   Specimen Description BLOOD RIGHT FOREARM  Final   Special Requests   Final    BOTTLES DRAWN AEROBIC AND ANAEROBIC Blood Culture adequate volume   Culture   Final    NO GROWTH 5 DAYS Performed at [facility], [address] [place], [place] [address]    Report Status 06/04/2018 FINAL  Final  Blood culture (routine x 2)     Status: None   Collection Time: 05/29/18 10:03 PM  Result Value Ref Range Status   Specimen Description BLOOD RIGHT  FOREARM  Final   Special Requests   Final    BOTTLES DRAWN AEROBIC AND ANAEROBIC Blood Culture adequate volume   Culture   Final    NO GROWTH 5 DAYS Performed at [facility], [place] [address], [place], [place] [address]    Report Status 06/04/2018 FINAL  Final  MRSA PCR Screening     Status: Abnormal   Collection Time: 05/30/18  2:00 AM  Result Value Ref Range Status   MRSA by PCR POSITIVE (A) NEGATIVE Final    Comment:        The GeneXpert MRSA Assay (FDA approved for NASAL specimens only), is one component of a comprehensive MRSA colonization surveillance program. It is not intended to diagnose MRSA infection nor to guide or monitor treatment for MRSA infections. RESULT CALLED TO, READ BACK BY AND VERIFIED WITH: Heide Spark [id number] @[phone]  BY V.WILKINS Performed at [place] [address] [place], [place] [address]          Radiology Studies: No results found.      Scheduled Meds: . acidophilus  1 capsule Oral QPM  . amiodarone  200 mg Oral Daily  . apixaban  5 mg Oral BID  . bisacodyl  10 mg Oral Daily  . brimonidine  1 drop Left Eye TID  . dorzolamide  1 drop Both Eyes BID  . doxycycline  100 mg Oral Q12H  . fluticasone  2 spray Each Nare Daily  . furosemide  40 mg Intravenous BID  . guaiFENesin  600 mg Oral BID  . insulin aspart  0-20 Units Subcutaneous TID WC  . insulin aspart  60 Units Subcutaneous TID WC   And  . insulin aspart  40 Units Subcutaneous QHS  . lactulose  20 g Oral Once  . levothyroxine  75 mcg Oral QAC breakfast  . methylPREDNISolone (SOLU-MEDROL) injection  40 mg Intravenous Daily  . mometasone-formoterol  2 puff Inhalation BID  . potassium chloride  20 mEq Oral Daily  . rosuvastatin  20 mg Oral QPM  . timolol  1 drop Both Eyes BID  . Travoprost (BAK Free)  1 drop Left Eye QHS  . triamcinolone lotion   Topical TID   Continuous Infusions:    LOS: 7 days     Cordelia Poche, MD [facility] 06/06/2018, 10:38 AM Pager: [phone]  If 7PM-7AM, please contact night-coverage [email or web address] 06/06/2018, 10:38 AM

## 2018-06-06 NOTE — Consult Note (Signed)
[place] Nurse wound follow up Wound type: chronic venous insufficiency with cellulitis (resolving) and healing wound at RLE (posterior/medial). Clinical CEAP 6. Measurement: 2cm x 1.5cm x 0.1cm Wound bed:red, moist Drainage (amount, consistency, odor) scant Periwound: dry, scaling/plaque formation consistent with chronic venous insufficiency Dressing procedure/placement/frequency: Continue twice weekly Unna's boots to bilateral LEs.  Patient will require HHRN for twice weekly Unna's Boot changes for two weeks, then decrease to 1 x weekly thereafter.  Note:  Patient reports that he has transportation issues and activity tolerance issues and cannot go to an outpatient [facility] for routine Unna's boot changes following the time that [facility] discharges him. Wounds reopen and he has to be re hospitalized. If you agree, consider referral to [facility] for continued follow up and routine (onece weekly) Unna's boot changes to reduce recidivism.   [place] nursing team will not follow, but will remain available to this patient, the nursing and medical teams.  Please re-consult if needed. Thanks, Maudie Flakes, MSN, RN, [place], Arther Abbott  Pager# [phone]

## 2018-06-06 NOTE — Progress Notes (Signed)
Progress Note  Patient Name: [name] Date of Encounter: 06/06/2018  Primary Cardiologist: Thompson Grayer, MD  Subjective   Large diuresis overnight- net negative 6L. Now -12L negative. Creatinine has improved with diuresis (1.68 ->1.56), however, BUN up to 44. WBC count is rising, suspect d/t steroids. Afebrile overnight - weight down to 306 lbs.  Inpatient Medications    Scheduled Meds: . acidophilus  1 capsule Oral QPM  . amiodarone  200 mg Oral Daily  . apixaban  5 mg Oral BID  . bisacodyl  10 mg Oral Daily  . brimonidine  1 drop Left Eye TID  . dorzolamide  1 drop Both Eyes BID  . doxycycline  100 mg Oral Q12H  . fluticasone  2 spray Each Nare Daily  . furosemide  40 mg Intravenous TID  . guaiFENesin  600 mg Oral BID  . insulin aspart  0-20 Units Subcutaneous TID WC  . insulin aspart  60 Units Subcutaneous TID WC   And  . insulin aspart  40 Units Subcutaneous QHS  . lactulose  20 g Oral Once  . levothyroxine  75 mcg Oral QAC breakfast  . methylPREDNISolone (SOLU-MEDROL) injection  40 mg Intravenous Daily  . mometasone-formoterol  2 puff Inhalation BID  . potassium chloride  20 mEq Oral Daily  . rosuvastatin  20 mg Oral QPM  . timolol  1 drop Both Eyes BID  . Travoprost (BAK Free)  1 drop Left Eye QHS  . triamcinolone lotion   Topical TID   Continuous Infusions:  PRN Meds: acetaminophen, calcium carbonate, [COMPLETED] hydrOXYzine **FOLLOWED BY** hydrOXYzine, ipratropium-albuterol, traMADol   Vital Signs    Vitals:   06/05/18 1939 06/05/18 2133 06/06/18 0000 06/06/18 0440  BP:  (!) 115/54  116/63  Pulse:  73  75  Resp:  18 18 17   Temp:  97.9 F (36.6 C)  97.6 F (36.4 C)  TempSrc:  Oral  Oral  SpO2: 98% 100% 100% 99%  Weight:    (!) 306 lb 14.1 oz (139.2 kg)  Height:        Intake/Output Summary (Last 24 hours) at 06/06/2018 0819 Last data filed at 06/06/2018 0449 Gross per 24 hour  Intake 120 ml  Output 6200 ml  Net -6080 ml   Filed Weights   06/03/18 0500 06/04/18 0427 06/06/18 0440  Weight: (!) 313 lb 7.9 oz (142.2 kg) (!) 309 lb 11.9 oz (140.5 kg) (!) 306 lb 14.1 oz (139.2 kg)    Telemetry    AV paced - Personally Reviewed  Physical Exam   GEN: Chronically ill morbidly obese AAM in NAD HEENT: Normocephalic, atraumatic, sclera non-icteric. Neck: No JVD or bruits. Cardiac: RRR no murmurs, rubs, or gallops.  Radials/DP/PT 1+ and equal bilaterally.  Respiratory: Diffusely diminished. Breathing is unlabored on O2 GI: Rounded, distended, +BS MS: no deformity but chronic skin thickening noted on most surfaces. Diffuse noticeable erythematous papular rash on back , lower hips, inner L AC. Not on abdomen, chest Extremities: No clubbing or cyanosis. Trace to 1+ BLE edema Neuro:  AAOx3. Follows commands. Psych: mood has improved  Labs    Chemistry Recent Labs  Lab 06/04/18 0822 06/04/18 2346 06/05/18 0100 06/06/18 0416  NA 137  --  138 140  K 4.0  --  4.5 4.7  CL 99  --  100 101  CO2 27  --  27 30  GLUCOSE 274*  --  195* 129*  BUN 28*  --  32* 44*  CREATININE 1.61*  --  1.68* 1.56*  CALCIUM 8.8*  --  9.1 9.7  PROT  --  7.6  --   --   ALBUMIN  --  3.1*  --   --   AST  --  27  --   --   ALT  --  23  --   --   ALKPHOS  --  92  --   --   BILITOT  --  0.5  --   --   GFRNONAA 40*  --  38* 42*  GFRAA 47*  --  44* 48*  ANIONGAP 11  --  11 9     Hematology Recent Labs  Lab 06/04/18 0822 06/05/18 0100 06/06/18 0416  WBC 9.7 12.4* 16.7*  RBC 4.50 4.35 4.40  HGB 10.2* 9.9* 10.2*  HCT 36.7* 34.7* 34.5*  MCV 81.6 79.8 78.4  MCH 22.7* 22.8* 23.2*  MCHC 27.8* 28.5* 29.6*  RDW 21.4* 21.3* 21.3*  PLT 255 274 321    Cardiac EnzymesNo results for input(s): TROPONINI in the last 168 hours. No results for input(s): TROPIPOC in the last 168 hours.   BNP Recent Labs  Lab 06/02/18 0334  BNP 1,005.6*     DDimer No results for input(s): DDIMER in the last 168 hours.   Radiology    No results found.  Patient  Profile     77 yo male patient with history chronic combined systolic and diastolic congestive heart failure, morbid obesity, PAF, anxiety, anemia, bladder cancer, ureteral cancer, gait difficulty, GERD, glaucoma, HTM, HLD, OSA, CKD III, IDDM, coronary artery disease,moderate aortic stenosis and sick sinus syndrome s/p STJ permanent pacemaker placement, hypothyroidism. 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 Admitted for sepsis felt 2/2 cellulitis and acute respiratory failure with hypoxia, found to have worsening LV systolic function (prev EF 45-50% in 07/2017 and now 35-40%), also now severe aortic stenosisfor which cardiology is following. Last cath seen in chart in 2003 showed nonobstructive CAD with 40% LAD, 30% prox RCA, 30-40% distal RCA. I/O's have not been totally accurate due to trouble with urinals and condom caths moving out of place.  Assessment & Plan    1. Acute on chronic combined CHF:  - continue IV lasix diuresis today, will decrease to BID dosing given significant diuresis overnight and rising BUN  2.Sepsis:Likely 2/2 cellulitis. Started on IV antibiotics and unna boots placed. Management per primary team.    3.Severe Aortic Stenosis:Echo this admission with progression of AS to severe with mean gradient around41 mmHg and peak gradient of 71 mmHg. Given comorbidities, not likely to be a good candidate for SAVR. Team has determined that outpatient workup for TAVR once recovered from acute illness would be appropriate.   4. Acute kidney injury superimposed on CKD III - peak Cr was 2.16 on 6/23, improved by 6/26 but rose yesterday. Needs BMET today and daily.  5. Paroxysmal atrial fibrillation:maintaining sinus rhythm with amiodarone 200mg  daily. Will update thyroid function given acute CHF this admission, and f/u LFTs given abnormal on admission. Apixaban dose remains appropriate but need to continue to follow creatinine.  6. SSS s/p JJH:ERDEYCX RV pacing. Septal  dyssynergy noted on echo, possibly contributing to #1. Outpatient follow-up recommended with Dr. Rayann Heman. Dr. Rayann Heman last saw 05/05/18 and they discussed possible upgrade of PPM to CRT-P but patient wished to avoid procedures at that time.  - consider outpatient CRT-P upgrade prior to TAVR if he is agreeable to the procedure  7. Chronic appearing  anemia - previous Hgb in 2018 were 11-13 but from 2019 onward he appears to be in the 8-10 range. Further per IM. Hemoccult ordered? No result.  8. Drug rash - on topical steroid cream as well as systemic solumedrol  For questions or updates, please contact [place] Please consult [email or web address] for contact info under Cardiology/STEMI.  Pixie Casino, MD, [facility], [place] Director of the [facility]  Card[facility]ovascular [facility] Diplomate of the American Board of Clinical Lipidology Attending Cardiologist  Direct Dial: [phone]  Fax: [phone]  Website:  [email or web address]  Pixie Casino, MD 06/06/2018, 8:19 AM

## 2018-06-07 DIAGNOSIS — I481 Persistent atrial fibrillation: Secondary | ICD-10-CM

## 2018-06-07 LAB — BASIC METABOLIC PANEL
Anion gap: 8 (ref 5–15)
BUN: 51 mg/dL — AB (ref 8–23)
CHLORIDE: 100 mmol/L (ref 98–111)
CO2: 29 mmol/L (ref 22–32)
CREATININE: 1.69 mg/dL — AB (ref 0.61–1.24)
Calcium: 9.4 mg/dL (ref 8.9–10.3)
GFR calc Af Amer: 44 mL/min — ABNORMAL LOW (ref >60)
GFR calc non Af Amer: 38 mL/min — ABNORMAL LOW (ref >60)
GLUCOSE: 136 mg/dL — AB (ref 70–99)
Potassium: 4.3 mmol/L (ref 3.5–5.1)
SODIUM: 137 mmol/L (ref 135–145)

## 2018-06-07 LAB — CBC
HCT: 36.2 % — ABNORMAL LOW (ref 39.0–52.0)
Hemoglobin: 10.5 g/dL — ABNORMAL LOW (ref 13.0–17.0)
MCH: 22.7 pg — AB (ref 26.0–34.0)
MCHC: 29 g/dL — AB (ref 30.0–36.0)
MCV: 78.2 fL (ref 78.0–100.0)
Platelets: 364 10*3/uL (ref 150–400)
RBC: 4.63 MIL/uL (ref 4.22–5.81)
RDW: 21.3 % — AB (ref 11.5–15.5)
WBC: 14.6 10*3/uL — ABNORMAL HIGH (ref 4.0–10.5)

## 2018-06-07 LAB — GLUCOSE, CAPILLARY
GLUCOSE-CAPILLARY: 287 mg/dL — AB (ref 70–99)
Glucose-Capillary: 114 mg/dL — ABNORMAL HIGH (ref 70–99)
Glucose-Capillary: 166 mg/dL — ABNORMAL HIGH (ref 70–99)
Glucose-Capillary: 227 mg/dL — ABNORMAL HIGH (ref 70–99)
Glucose-Capillary: 274 mg/dL — ABNORMAL HIGH (ref 70–99)

## 2018-06-07 MED ORDER — CHLORHEXIDINE GLUCONATE CLOTH 2 % EX PADS: 6.0000 | MEDICATED_PAD | Freq: Every day | CUTANEOUS | Status: DC

## 2018-06-07 MED ORDER — PREDNISONE 20 MG PO TABS
20.0000 mg | ORAL_TABLET | Freq: Every day | ORAL | Status: DC
  Administered 2018-06-08: 20 mg via ORAL
  Filled 2018-06-07: qty 1

## 2018-06-07 MED ORDER — MUPIROCIN 2 % EX OINT: 1.0000 "application " | TOPICAL_OINTMENT | Freq: Two times a day (BID) | CUTANEOUS | Status: DC

## 2018-06-07 MED ORDER — FUROSEMIDE 10 MG/ML IJ SOLN
20.0000 mg | Freq: Two times a day (BID) | INTRAMUSCULAR | Status: DC
  Administered 2018-06-07 – 2018-06-08 (×2): 20 mg via INTRAVENOUS
  Filled 2018-06-07 (×2): qty 2

## 2018-06-07 NOTE — Progress Notes (Signed)
Patients foley was placed on 6/26 for aggressive IV diuresis. Pt states it was difficult for him to keep a condom catheter on and get to bathroom in time to urinate. Pt currently getting 40mg  IV lasix BID. Jeannette Corpus, NP paged to make aware and see if foley could be removed. Verbal order to "keep foley in until attending MD rounds." Will continue to monitor.

## 2018-06-07 NOTE — Progress Notes (Signed)
Physical Therapy Treatment Patient Details Name: [name] MRN: [id number] DOB: 1941/08/08 Today's Date: 06/07/2018    History of Present Illness 77 y.o. male admitted after falling in bathroom.  Dx:  likely sepsis due to bil. LE cellulitis, volume overload due to diastolic HF with EF 64%;.  PMH aortic stensosi; DM, morbid obesity, sick sinus syndrom s/p PPM; A-Fib, peripheral neuropathy, renal disease, glaucoma, COPD     PT Comments    Progressing with mobility. Pt denies LE weakness but he does c/o dyspnea, fatigue. Unable to get pulse ox reading on today. Will continue to follow during hospital stay.    Follow Up Recommendations  Home health PT(pt/wife decline placement)     Equipment Recommendations  None recommended by PT    Recommendations for Other Services       Precautions / Restrictions Precautions Precautions: Fall Precaution Comments: monitor O2 Restrictions Weight Bearing Restrictions: No    Mobility  Bed Mobility               General bed mobility comments: pt up in recliner  Transfers Overall transfer level: Needs assistance Equipment used: Rolling walker (2 wheeled) Transfers: Sit to/from Stand Sit to Stand: Min guard Stand pivot transfers: Min guard       General transfer comment: close guard for safety. Increased time.   Ambulation/Gait Ambulation/Gait assistance: Min guard Gait Distance (Feet): 120 Feet Assistive device: Rolling walker (2 wheeled) Gait Pattern/deviations: Step-through pattern;Decreased stride length     General Gait Details: slow gait speed. 2 standing rest breaks needed/taken due to fatigue, dyspnea. Unable to get O2 sat reading (pt has really cold fingers-oximeter unable to pick up).    Stairs             Wheelchair Mobility    Modified Rankin (Stroke Patients Only)       Balance Overall balance assessment: Needs assistance Sitting-balance support: Feet supported Sitting balance-Leahy Scale: Good      Standing balance support: Bilateral upper extremity supported;During functional activity Standing balance-Leahy Scale: Poor                              Cognition Arousal/Alertness: Awake/alert Behavior During Therapy: WFL for tasks assessed/performed Overall Cognitive Status: Within Functional Limits for tasks assessed                                        Exercises      General Comments        Pertinent Vitals/Pain Pain Assessment: No/denies pain Pain Score: 0-No pain Faces Pain Scale: No hurt Pain Location: back is itchy and has a rash -MD aware Pain Intervention(s): Monitored during session    Home Living Family/patient expects to be discharged to:: Private residence Living Arrangements: Spouse/significant other Available Help at Discharge: Family;Available 24 hours/day Type of Home: House Home Access: Stairs to enter Entrance Stairs-Rails: Right Home Layout: One level Home Equipment: Walker - 2 wheels;Walker - 4 wheels;Cane - single point;Shower seat;Hand held shower head Additional Comments: sleeps in a recliner    Prior Function Level of Independence: Needs assistance  Gait / Transfers Assistance Needed: uses a cane and furniture inside and RW out ADL's / Homemaking Assistance Needed: wife helps with LB bathing and dressing, sits to shower Comments: wife and pt share cooking activities   PT Goals (current goals can now  be found in the care plan section) Progress towards PT goals: Progressing toward goals    Frequency    Min 3X/week      PT Plan Frequency needs to be updated;Discharge plan needs to be updated    Co-evaluation              AM-PAC PT "6 Clicks" Daily Activity  Outcome Measure  Difficulty turning over in bed (including adjusting bedclothes, sheets and blankets)?: A Little Difficulty moving from lying on back to sitting on the side of the bed? : A Little Difficulty sitting down on and standing up  from a chair with arms (e.g., wheelchair, bedside commode, etc,.)?: A Little Help needed moving to and from a bed to chair (including a wheelchair)?: A Little Help needed walking in hospital room?: A Little Help needed climbing 3-5 steps with a railing? : A Lot 6 Click Score: 17    End of Session   Activity Tolerance: Patient limited by fatigue Patient left: in chair;with call bell/phone within reach;with family/visitor present   PT Visit Diagnosis: History of falling (Z91.81);Difficulty in walking, not elsewhere classified (R26.2);Muscle weakness (generalized) (M62.81)     Time: 6122-4497 PT Time Calculation (min) (ACUTE ONLY): 20 min  Charges:  $Gait Training: 8-22 mins                    G Codes:         Weston Anna, MPT Pager: [phone]

## 2018-06-07 NOTE — Progress Notes (Addendum)
Progress Note  Patient Name: [name] Date of Encounter: 06/07/2018  Primary Cardiologist: Thompson Grayer, MD   Subjective   Again, good diuresis yesterday of -3.6 L.  Less short of breath.  No chest pain. Would like to go home.   Inpatient Medications    Scheduled Meds: . acidophilus  1 capsule Oral QPM  . amiodarone  200 mg Oral Daily  . apixaban  5 mg Oral BID  . bisacodyl  10 mg Oral Daily  . brimonidine  1 drop Left Eye TID  . dorzolamide  1 drop Both Eyes BID  . doxycycline  100 mg Oral Q12H  . fluticasone  2 spray Each Nare Daily  . furosemide  20 mg Intravenous BID  . guaiFENesin  600 mg Oral BID  . insulin aspart  0-20 Units Subcutaneous TID WC  . insulin aspart  60 Units Subcutaneous TID WC   And  . insulin aspart  40 Units Subcutaneous QHS  . lactulose  20 g Oral Once  . levothyroxine  75 mcg Oral QAC breakfast  . mometasone-formoterol  2 puff Inhalation BID  . potassium chloride  20 mEq Oral Daily  . [START ON 06/08/2018] predniSONE  20 mg Oral Q breakfast  . rosuvastatin  20 mg Oral QPM  . timolol  1 drop Both Eyes BID  . Travoprost (BAK Free)  1 drop Left Eye QHS  . triamcinolone lotion   Topical TID   Continuous Infusions:  PRN Meds: acetaminophen, calcium carbonate, [COMPLETED] hydrOXYzine **FOLLOWED BY** hydrOXYzine, ipratropium-albuterol, traMADol   Vital Signs    Vitals:   06/06/18 2353 06/07/18 0525 06/07/18 0533 06/07/18 1414  BP:  113/68  (!) 112/52  Pulse:  72  78  Resp: 18 20  20   Temp:  (!) 97.5 F (36.4 C)  97.7 F (36.5 C)  TempSrc:  Oral  Oral  SpO2: 100% 100%  100%  Weight:   (!) 300 lb 4.8 oz (136.2 kg)   Height:        Intake/Output Summary (Last 24 hours) at 06/07/2018 1421 Last data filed at 06/07/2018 1141 Gross per 24 hour  Intake 270 ml  Output 4500 ml  Net -4230 ml   Filed Weights   06/04/18 0427 06/06/18 0440 06/07/18 0533  Weight: (!) 309 lb 11.9 oz (140.5 kg) (!) 306 lb 14.1 oz (139.2 kg) (!) 300 lb 4.8 oz  (136.2 kg)    Telemetry    No adverse arrhythmias.- Personally Reviewed  ECG    AV pacing- Personally Reviewed  Physical Exam   GEN: No acute distress.   Neck: No JVD Cardiac: RRR, 3/6 SM, rubs, or gallops.  Respiratory: Clear to auscultation bilaterally. GI: Soft, nontender, non-distended foley MS:  Positive obesity, + edema; No deformity. Neuro:  Nonfocal  Psych: Normal affect   Labs    Chemistry Recent Labs  Lab 06/04/18 2346 06/05/18 0100 06/06/18 0416 06/07/18 0401  NA  --  138 140 137  K  --  4.5 4.7 4.3  CL  --  100 101 100  CO2  --  27 30 29   GLUCOSE  --  195* 129* 136*  BUN  --  32* 44* 51*  CREATININE  --  1.68* 1.56* 1.69*  CALCIUM  --  9.1 9.7 9.4  PROT 7.6  --   --   --   ALBUMIN 3.1*  --   --   --   AST 27  --   --   --  ALT 23  --   --   --   ALKPHOS 92  --   --   --   BILITOT 0.5  --   --   --   GFRNONAA  --  38* 42* 38*  GFRAA  --  44* 48* 44*  ANIONGAP  --  11 9 8      Hematology Recent Labs  Lab 06/05/18 0100 06/06/18 0416 06/07/18 0401  WBC 12.4* 16.7* 14.6*  RBC 4.35 4.40 4.63  HGB 9.9* 10.2* 10.5*  HCT 34.7* 34.5* 36.2*  MCV 79.8 78.4 78.2  MCH 22.8* 23.2* 22.7*  MCHC 28.5* 29.6* 29.0*  RDW 21.3* 21.3* 21.3*  PLT 274 321 364    Cardiac EnzymesNo results for input(s): TROPONINI in the last 168 hours. No results for input(s): TROPIPOC in the last 168 hours.   BNP Recent Labs  Lab 06/02/18 0334  BNP 1,005.6*     DDimer No results for input(s): DDIMER in the last 168 hours.   Radiology    No results found.  Cardiac Studies   Echocardiogram 05/31/2018: - Left ventricle: The cavity size was mildly dilated. There was   moderate concentric hypertrophy. Systolic function was moderately   reduced. The estimated ejection fraction was in the range of 35%   to 40%. The study is not technically sufficient to allow   evaluation of LV diastolic function. - Ventricular septum: Septal motion showed abnormal function,    dyssynergy, and paradox. These changes are consistent with right   ventricular pacing. - Aortic valve: Valve mobility was restricted. There was severe   stenosis. Valve area (VTI): 0.91 cm^2. Valve area (Vmax): 0.95   cm^2. Valve area (Vmean): 0.92 cm^2. - Mitral valve: Severely calcified annulus. Transvalvular velocity   was minimally increased. The findings are consistent with trivial   stenosis. There was mild regurgitation. Valve area by continuity   equation (using LVOT flow): 1.81 cm^2. - Left atrium: The atrium was moderately dilated. - Right ventricle: The cavity size was moderately dilated. Systolic   function was mildly reduced. - Right atrium: The atrium was mildly to moderately dilated. - Pulmonary arteries: Systolic pressure was mildly increased. PA   peak pressure: 34 mm Hg (S).  Patient Profile     77 y.o. male here with acute on chronic systolic heart failure, morbid obesity paroxysmal atrial fibrillation severe aortic stenosis.  Pacemaker in place.  Assessment & Plan    Acute on chronic combined systolic and diastolic heart failure - BUN continues to rise, creatinine remained stable.  Still appears fluid overloaded.  Given aggressive diuresis I will decrease dose again to 20 mg IV twice daily.   - Weight 300.   - Net out 16 L  -I think we still have more fluid to go.  I am comfortable with him discontinuing his Foley if he is interested in getting out of the bed to go to the bathroom.  Severe aortic stenosis - TAVR work-up as outpatient.  Would need to make sure he was done with infection.  CKD 3 -Had acute kidney injury early on this admission.  Doing well currently.  Watching with increasing BUN.  Paroxysmal atrial fibrillation - Amiodarone 200 mg a day.  Eliquis.  Pacemaker - Had discussed CRT upgrade but patient did not wish to proceed at that time.  Dr. Rayann Heman.  Sepsis secondary to cellulitis - IV antibiotics  Not ready for discharge yet.  For  questions or updates, please contact [place] Please consult [email or web address]  for contact info under Cardiology/STEMI.      Signed, Candee Furbish, MD  06/07/2018, 2:21 PM

## 2018-06-07 NOTE — Progress Notes (Signed)
Occupational Therapy Treatment Patient Details Name: [name] MRN: [id number] DOB: 12/10/1940 Today's Date: 06/07/2018    History of present illness This 77 y.o. male admitted after falling in bathroom.  Dx:  likely sepsis due to bil. LE cellulitis, volume overload due to diastolic HF with EF 17%;.  PMH aortic stensosi; DM, morbid obesity, sick sinus syndrom s/p PPM; A-Fib, peripheral neuropathy, renal disease, glaucoma, COPD    OT comments  Pt making good progress with functional goals. OT will continue to follow acutely  Follow Up Recommendations  Supervision/Assistance - 24 hour    Equipment Recommendations  None recommended by OT    Recommendations for Other Services      Precautions / Restrictions Precautions Precautions: Fall Precaution Comments: monitor O2 Restrictions Weight Bearing Restrictions: No       Mobility Bed Mobility               General bed mobility comments: pt up in recliner  Transfers Overall transfer level: Needs assistance Equipment used: Rolling walker (2 wheeled) Transfers: Sit to/from Stand   Stand pivot transfers: Min guard            Balance Overall balance assessment: Needs assistance Sitting-balance support: Feet supported Sitting balance-Leahy Scale: Good     Standing balance support: Bilateral upper extremity supported;During functional activity Standing balance-Leahy Scale: Poor                             ADL either performed or assessed with clinical judgement   ADL Overall ADL's : Needs assistance/impaired     Grooming: Wash/dry hands;Wash/dry face;Brushing hair;Min guard;Standing   Upper Body Bathing: Supervision/ safety;Set up;Sitting;With caregiver independent assisting Upper Body Bathing Details (indicate cue type and reason): simulated Lower Body Bathing: Sit to/from stand;Moderate assistance;With caregiver independent assisting Lower Body Bathing Details (indicate cue type and reason):  simulated Upper Body Dressing : Set up;Supervision/safety;Standing;With caregiver independent assisting       Toilet Transfer: Min guard;Ambulation;RW;Comfort height toilet;With caregiver independent assisting   Toileting- Clothing Manipulation and Hygiene: Sit to/from stand;Minimal assistance;With caregiver independent assisting       Functional mobility during ADLs: Rolling walker;Min guard;Caregiver able to provide necessary level of assistance       Vision Baseline Vision/History: Wears glasses Wears Glasses: At all times Patient Visual Report: No change from baseline     Perception     Praxis      Cognition Arousal/Alertness: Awake/alert Behavior During Therapy: WFL for tasks assessed/performed Overall Cognitive Status: Within Functional Limits for tasks assessed                                          Exercises     Shoulder Instructions       General Comments      Pertinent Vitals/ Pain       Pain Assessment: No/denies pain Pain Score: 0-No pain Pain Intervention(s): Monitored during session  Home Living Family/patient expects to be discharged to:: Private residence Living Arrangements: Spouse/significant other Available Help at Discharge: Family;Available 24 hours/day Type of Home: House Home Access: Stairs to enter [employer] of Steps: 4 Entrance Stairs-Rails: Right Home Layout: One level     Bathroom Shower/Tub: Occupational psychologist: Standard     Home Equipment: Environmental consultant - 2 wheels;Walker - 4 wheels;Cane - single point;Shower seat;Hand held  shower head   Additional Comments: sleeps in a recliner      Prior Functioning/Environment Level of Independence: Needs assistance  Gait / Transfers Assistance Needed: uses a cane and furniture inside and RW out ADL's / Homemaking Assistance Needed: wife helps with LB bathing and dressing, sits to shower   Comments: wife and pt share cooking activities    Frequency  Min 2X/week        Progress Toward Goals  OT Goals(current goals can now be found in the care plan section)  Progress towards OT goals: Progressing toward goals     Plan Discharge plan remains appropriate    Co-evaluation                 AM-PAC PT "6 Clicks" Daily Activity     Outcome Measure   Help from another person eating meals?: None Help from another person taking care of personal grooming?: A Little   Help from another person bathing (including washing, rinsing, drying)?: A Little Help from another person to put on and taking off regular upper body clothing?: A Little Help from another person to put on and taking off regular lower body clothing?: A Lot 6 Click Score: 15    End of Session Equipment Utilized During Treatment: Rolling walker  OT Visit Diagnosis: Unsteadiness on feet (R26.81)   Activity Tolerance Patient tolerated treatment well   Patient Left in chair;with call bell/phone within reach;with family/visitor present   Nurse Communication      Functional Assessment Tool Used: AM-PAC 6 Clicks Daily Activity   Time: 2707-8675 OT Time Calculation (min): 24 min  Charges: OT G-codes **NOT FOR INPATIENT CLASS** Functional Assessment Tool Used: AM-PAC 6 Clicks Daily Activity OT General Charges $OT Visit: 1 Visit OT Treatments $Self Care/Home Management : 8-22 mins $Therapeutic Activity: 8-22 mins     Britt Bottom 06/07/2018, 2:06 PM

## 2018-06-07 NOTE — Progress Notes (Signed)
PROGRESS NOTE    STRIDER VALLANCE  RCV:[id number] DOB: [birth year] DOA: 05/29/2018 PCP: Renato Shin, MD   Brief Narrative: [name] is a 77 y.o. malewith CHF (EF45-50%), pAF and hx of SSS s/p PPM (pacer-dependent) on eliquis, COPD, DMT2, CKD stage 2-3, and GBS bacteremia in January 2019 and lower extremity cellulitis. He presented after a fall and was found to have concern for cellulitis causing sepsis. He was started on IV antibiotics and an Haematologist. He also developed acute heart failure and is currently undergoing IV diuresis.   Assessment & Plan:   Principal Problem:   Severe sepsis ([place]) Active Problems:   A-fib ([facility])   CKD (chronic kidney disease), stage III ([facility])   Cellulitis   Hypoxia   Acute on chronic systolic heart failure ([facility])   Severe aortic stenosis   Severe sepsis Source is thought to be cellulitis. Resolved.  Cellulitis Bilateral legs. Currently on antibiotic therapy. Unna boots placed earlier. Cellulitis improved. Discontinued Vancomycin/Ceftriaxone. Unna boots replaced. -Continue doxycycline -PT recommendations: SNF, however, patient is refusing. Will plan for home with home health  Acute on chronic systolic heart failure Weight on admission of 312 lbs. BNP of 1000. UOP of 4.35 L over last 24 hours with a weight today of 300 lbs, down from 323 lbs on 6/25. -Cardiology recommendations: Awaiting recommendations today -Potassium supplementation while on IV lasix -Daily weights, strict in and out  Acute respiratory failure with hypoxia In setting of acute heart failure. Resolved. On room air.  Diabetes mellitus, insulin dependent Better controlled generally. Some significant hyperglycemia but now hypoglycemia. On QID dosing of Novolog as an outpatient. Not on a long-acting regimen. -Novolog 60 units qAC and continue to 40 units qHS -Continue SSI resistant while on steroids -Watch blood sugar while on steroids  Atrial fibrillation Rate controlled. PPM  in place. -Continue amiodarone -Continue Eliquis  Acute kidney injury on CKD stage 3 Baseline creatinine of 1.5, although, creatinine prior to admission was 2.02. 1.95 on admission. Currently stable with diuresis.  Aortic stenosis Severe on recent Transthoracic Echocardiogram. -Cardiology recommendations: outpatient follow-up  Biatrial enlargement Right ventricular dilation Cardiology follow-up outpatient  Hypothyroidism -Continue Synthroid  Drug rash Most likely diagnosis. Ceftriaxone most likely culprit. Significantly improved -Continue Atarax -Prednisone taper starting tomorrow; will discontinue solu-medrol   Leukocytosis Secondary to steroids. No continued/new evidence of infection   DVT prophylaxis: Eliquis Code Status:   Code Status: DNR Family Communication: None at bedside Disposition Plan: Discharge to home pending improvement of heart failure   Consultants:   Cardiology  Procedures:   6/24: Echocardiogram Study Conclusions  - Left ventricle: The cavity size was mildly dilated. There was   moderate concentric hypertrophy. Systolic function was moderately   reduced. The estimated ejection fraction was in the range of 35%   to 40%. The study is not technically sufficient to allow   evaluation of LV diastolic function. - Ventricular septum: Septal motion showed abnormal function,   dyssynergy, and paradox. These changes are consistent with right   ventricular pacing. - Aortic valve: Valve mobility was restricted. There was severe   stenosis. Valve area (VTI): 0.91 cm^2. Valve area (Vmax): 0.95   cm^2. Valve area (Vmean): 0.92 cm^2. - Mitral valve: Severely calcified annulus. Transvalvular velocity   was minimally increased. The findings are consistent with trivial   stenosis. There was mild regurgitation. Valve area by continuity   equation (using LVOT flow): 1.81 cm^2. - Left atrium: The atrium was moderately dilated. - Right ventricle:  The cavity  size was moderately dilated. Systolic   function was mildly reduced. - Right atrium: The atrium was mildly to moderately dilated. - Pulmonary arteries: Systolic pressure was mildly increased. PA   peak pressure: 34 mm Hg (S).  Antimicrobials: 1. Zosyn (6/22>6/23) 2. Vancomycin (6/22>>6/28) 3. Ceftriaxone (6/24>>6/28) 4. Doxycycline (6/28>>   Subjective: Some itching today.  Objective: Vitals:   06/06/18 2110 06/06/18 2353 06/07/18 0525 06/07/18 0533  BP: (!) 120/56  113/68   Pulse: 71  72   Resp: 20 18 20    Temp: 97.9 F (36.6 C)  (!) 97.5 F (36.4 C)   TempSrc: Oral  Oral   SpO2: 100% 100% 100%   Weight:    (!) 136.2 kg (300 lb 4.8 oz)  Height:        Intake/Output Summary (Last 24 hours) at 06/07/2018 0927 Last data filed at 06/07/2018 0600 Gross per 24 hour  Intake 750 ml  Output 4350 ml  Net -3600 ml   Filed Weights   06/04/18 0427 06/06/18 0440 06/07/18 0533  Weight: (!) 140.5 kg (309 lb 11.9 oz) (!) 139.2 kg (306 lb 14.1 oz) (!) 136.2 kg (300 lb 4.8 oz)    Examination:  General exam: Appears calm and comfortable Respiratory system: Rales bilaterally. Respiratory effort normal. Cardiovascular system: S1 & S2 heard, RRR. Gastrointestinal system: Abdomen is nondistended, soft and nontender.  Normal bowel sounds heard. Central nervous system: Alert and oriented. No focal neurological deficits. Extremities: No edema. No calf tenderness Skin: No cyanosis. Macular rash over back, trunk, left arm, legs has improved and is faint Psychiatry: Judgement and insight appear normal. Mood & affect appropriate.    Data Reviewed: I have personally reviewed following labs and imaging studies  CBC: Recent Labs  Lab 06/03/18 0332 06/04/18 0822 06/05/18 0100 06/06/18 0416 06/07/18 0401  WBC 9.0 9.7 12.4* 16.7* 14.6*  HGB 10.1* 10.2* 9.9* 10.2* 10.5*  HCT 35.4* 36.7* 34.7* 34.5* 36.2*  MCV 80.6 81.6 79.8 78.4 78.2  PLT 249 255 274 321 253   Basic Metabolic  Panel: Recent Labs  Lab 06/01/18 0344 06/02/18 0334 06/03/18 0332 06/04/18 0406 06/04/18 0822 06/05/18 0100 06/06/18 0416 06/07/18 0401  NA 140 141 144  --  137 138 140 137  K 3.4* 4.0 3.5  --  4.0 4.5 4.7 4.3  CL 105 106 105  --  99 100 101 100  CO2 28 26 32  --  27 27 30 29   GLUCOSE 80 134* 41*  --  274* 195* 129* 136*  BUN 27* 26* 28*  --  28* 32* 44* 51*  CREATININE 1.64* 1.53* 1.70*  --  1.61* 1.68* 1.56* 1.69*  CALCIUM 8.5* 8.2* 9.0  --  8.8* 9.1 9.7 9.4  MG 1.9 2.1 2.2 2.1  --   --   --   --    GFR: Estimated Creatinine Clearance: 52.5 mL/min (A) (by C-G formula based on SCr of 1.69 mg/dL (H)). Liver Function Tests: Recent Labs  Lab 06/04/18 2346  AST 27  ALT 23  ALKPHOS 92  BILITOT 0.5  PROT 7.6  ALBUMIN 3.1*   No results for input(s): LIPASE, AMYLASE in the last 168 hours. No results for input(s): AMMONIA in the last 168 hours. Coagulation Profile: No results for input(s): INR, PROTIME in the last 168 hours. Cardiac Enzymes: No results for input(s): CKTOTAL, CKMB, CKMBINDEX, TROPONINI in the last 168 hours. BNP (last 3 results) Recent Labs    10/26/17 1425  PROBNP 1,148*  HbA1C: No results for input(s): HGBA1C in the last 72 hours. CBG: Recent Labs  Lab 06/06/18 1156 06/06/18 1649 06/06/18 2104 06/07/18 0038 06/07/18 0735  GLUCAP 295* 263* 331* 274* 114*   Lipid Profile: No results for input(s): CHOL, HDL, LDLCALC, TRIG, CHOLHDL, LDLDIRECT in the last 72 hours. Thyroid Function Tests: No results for input(s): TSH, T4TOTAL, FREET4, T3FREE, THYROIDAB in the last 72 hours. Anemia Panel: No results for input(s): VITAMINB12, FOLATE, FERRITIN, TIBC, IRON, RETICCTPCT in the last 72 hours. Sepsis Labs: No results for input(s): PROCALCITON, LATICACIDVEN in the last 168 hours.  Recent Results (from the past 240 hour(s))  Blood culture (routine x 2)     Status: None   Collection Time: 05/29/18 10:03 PM  Result Value Ref Range Status   Specimen  Description BLOOD RIGHT FOREARM  Final   Special Requests   Final    BOTTLES DRAWN AEROBIC AND ANAEROBIC Blood Culture adequate volume   Culture   Final    NO GROWTH 5 DAYS Performed at [facility], [address], [place], [place] [address]    Report Status 06/04/2018 FINAL  Final  Blood culture (routine x 2)     Status: None   Collection Time: 05/29/18 10:03 PM  Result Value Ref Range Status   Specimen Description BLOOD RIGHT FOREARM  Final   Special Requests   Final    BOTTLES DRAWN AEROBIC AND ANAEROBIC Blood Culture adequate volume   Culture   Final    NO GROWTH 5 DAYS Performed at [facility], [place] [address] [place], [place] [address]    Report Status 06/04/2018 FINAL  Final  MRSA PCR Screening     Status: Abnormal   Collection Time: 05/30/18  2:00 AM  Result Value Ref Range Status   MRSA by PCR POSITIVE (A) NEGATIVE Final    Comment:        The GeneXpert MRSA Assay (FDA approved for NASAL specimens only), is one component of a comprehensive MRSA colonization surveillance program. It is not intended to diagnose MRSA infection nor to guide or monitor treatment for MRSA infections. RESULT CALLED TO, READ BACK BY AND VERIFIED WITH: Heide Spark [id number] @[phone]  BY V.WILKINS Performed at [facility] [address] [place], [place] [address]          Radiology Studies: No results found.      Scheduled Meds: . acidophilus  1 capsule Oral QPM  . amiodarone  200 mg Oral Daily  . apixaban  5 mg Oral BID  . bisacodyl  10 mg Oral Daily  . brimonidine  1 drop Left Eye TID  . dorzolamide  1 drop Both Eyes BID  . doxycycline  100 mg Oral Q12H  . fluticasone  2 spray Each Nare Daily  . furosemide  40 mg Intravenous BID  . guaiFENesin  600 mg Oral BID  . insulin aspart  0-20 Units Subcutaneous TID WC  . insulin aspart  60 Units Subcutaneous TID WC   And  . insulin aspart  40 Units Subcutaneous QHS  . lactulose  20 g Oral Once  .  levothyroxine  75 mcg Oral QAC breakfast  . methylPREDNISolone (SOLU-MEDROL) injection  40 mg Intravenous Daily  . mometasone-formoterol  2 puff Inhalation BID  . potassium chloride  20 mEq Oral Daily  . rosuvastatin  20 mg Oral QPM  . timolol  1 drop Both Eyes BID  . Travoprost (BAK Free)  1 drop Left Eye QHS  .  triamcinolone lotion   Topical TID   Continuous Infusions:    LOS: 8 days     Cordelia Poche, MD [facility] 06/07/2018, 9:27 AM Pager: [phone]  If 7PM-7AM, please contact night-coverage [email or web address] 06/07/2018, 9:27 AM

## 2018-06-08 ENCOUNTER — Other Ambulatory Visit: Payer: Self-pay | Admitting: Physician Assistant

## 2018-06-08 DIAGNOSIS — Z23 Encounter for immunization: Secondary | ICD-10-CM | POA: Diagnosis not present

## 2018-06-08 DIAGNOSIS — I5043 Acute on chronic combined systolic (congestive) and diastolic (congestive) heart failure: Secondary | ICD-10-CM

## 2018-06-08 LAB — CBC
HCT: 34.5 % — ABNORMAL LOW (ref 39.0–52.0)
Hemoglobin: 10.1 g/dL — ABNORMAL LOW (ref 13.0–17.0)
MCH: 23.1 pg — AB (ref 26.0–34.0)
MCHC: 29.3 g/dL — AB (ref 30.0–36.0)
MCV: 78.9 fL (ref 78.0–100.0)
Platelets: 334 10*3/uL (ref 150–400)
RBC: 4.37 MIL/uL (ref 4.22–5.81)
RDW: 21.1 % — AB (ref 11.5–15.5)
WBC: 13.9 10*3/uL — ABNORMAL HIGH (ref 4.0–10.5)

## 2018-06-08 LAB — BASIC METABOLIC PANEL
Anion gap: 9 (ref 5–15)
BUN: 59 mg/dL — AB (ref 8–23)
CALCIUM: 9.1 mg/dL (ref 8.9–10.3)
CO2: 26 mmol/L (ref 22–32)
CREATININE: 1.85 mg/dL — AB (ref 0.61–1.24)
Chloride: 103 mmol/L (ref 98–111)
GFR calc Af Amer: 39 mL/min — ABNORMAL LOW (ref >60)
GFR calc non Af Amer: 34 mL/min — ABNORMAL LOW (ref >60)
Glucose, Bld: 176 mg/dL — ABNORMAL HIGH (ref 70–99)
Potassium: 4.6 mmol/L (ref 3.5–5.1)
Sodium: 138 mmol/L (ref 135–145)

## 2018-06-08 LAB — GLUCOSE, CAPILLARY
GLUCOSE-CAPILLARY: 249 mg/dL — AB (ref 70–99)
Glucose-Capillary: 330 mg/dL — ABNORMAL HIGH (ref 70–99)

## 2018-06-08 MED ORDER — HYDROXYZINE HCL 25 MG PO TABS: 25.0000 mg | ORAL_TABLET | Freq: Three times a day (TID) | ORAL | 0 refills | Status: DC | PRN

## 2018-06-08 MED ORDER — POTASSIUM CHLORIDE CRYS ER 10 MEQ PO TBCR: 10.0000 meq | EXTENDED_RELEASE_TABLET | Freq: Every day | ORAL | 0 refills | Status: DC

## 2018-06-08 MED ORDER — PREDNISONE 10 MG PO TABS: ORAL_TABLET | ORAL | 0 refills | Status: AC

## 2018-06-08 NOTE — Progress Notes (Signed)
b

## 2018-06-08 NOTE — Progress Notes (Signed)
Physical Therapy Treatment Patient Details Name: [name] MRN: [id number] DOB: 02/20/1941 Today's Date: 06/08/2018    History of Present Illness 77 y.o. male admitted after falling in bathroom.  Dx:  likely sepsis due to bil. LE cellulitis, volume overload due to diastolic HF with EF 20%;.  PMH aortic stensosi; DM, morbid obesity, sick sinus syndrom s/p PPM; A-Fib, peripheral neuropathy, renal disease, glaucoma, COPD     PT Comments    Pt agreeable to PT tx session. However, after taking a few steps, pt c/o significant discomfort and foley site. RN in room-she assessed site and determined that everything looked okay. Pt continued to c/o discomfort and requested to defer any further attempt at ambulation. Assisted pt back into the recliner.     Follow Up Recommendations  Home health PT(pt/wife decline placement)     Equipment Recommendations  None recommended by PT    Recommendations for Other Services       Precautions / Restrictions Precautions Precautions: Fall Restrictions Weight Bearing Restrictions: No    Mobility  Bed Mobility               General bed mobility comments: pt up in recliner  Transfers Overall transfer level: Needs assistance Equipment used: Rolling walker (2 wheeled) Transfers: Sit to/from Stand Sit to Stand: Min guard         General transfer comment: close guard for safety. Increased time.   Ambulation/Gait Ambulation/Gait assistance: Min guard Gait Distance (Feet): 7 Feet Assistive device: Rolling walker (2 wheeled) Gait Pattern/deviations: Step-through pattern;Wide base of support     General Gait Details: pt declined further ambulation due to foley discomfort.    Stairs             Wheelchair Mobility    Modified Rankin (Stroke Patients Only)       Balance                                            Cognition Arousal/Alertness: Awake/alert Behavior During Therapy: WFL for tasks  assessed/performed Overall Cognitive Status: Within Functional Limits for tasks assessed                                        Exercises      General Comments        Pertinent Vitals/Pain Pain Assessment: Faces Faces Pain Scale: Hurts whole lot Pain Location: foley catheter site Pain Descriptors / Indicators: Discomfort;Grimacing Pain Intervention(s): Limited activity within patient's tolerance;Repositioned    Home Living                      Prior Function            PT Goals (current goals can now be found in the care plan section) Progress towards PT goals: Progressing toward goals    Frequency    Min 3X/week      PT Plan Current plan remains appropriate    Co-evaluation              AM-PAC PT "6 Clicks" Daily Activity  Outcome Measure  Difficulty turning over in bed (including adjusting bedclothes, sheets and blankets)?: A Little Difficulty moving from lying on back to sitting on the side of the bed? : A Little Difficulty sitting  down on and standing up from a chair with arms (e.g., wheelchair, bedside commode, etc,.)?: A Little Help needed moving to and from a bed to chair (including a wheelchair)?: A Little Help needed walking in hospital room?: A Little Help needed climbing 3-5 steps with a railing? : A Lot 6 Click Score: 17    End of Session   Activity Tolerance: Patient limited by pain Patient left: in chair;with call bell/phone within reach;with nursing/sitter in room   PT Visit Diagnosis: History of falling (Z91.81);Difficulty in walking, not elsewhere classified (R26.2);Muscle weakness (generalized) (M62.81)     Time: 1002-1010 PT Time Calculation (min) (ACUTE ONLY): 8 min  Charges:  $Therapeutic Activity: 8-22 mins                    G Codes:        [name], MPT Pager: [phone]

## 2018-06-08 NOTE — Consult Note (Signed)
[facility] Nurse wound follow up Wound type: Moisture associated skin damage to the bilateral buttocks. Blanches, varying hues of red to deep red. Bedside RN requests pressure redistributing surface for buttocks impacted by moisture and heat. I today provide a pressure redistribution chair pad.  [facility] nursing team will not follow, but will remain available to this patient, the nursing and medical teams.  Please re-consult if needed. Thanks, Maudie Flakes, MSN, RN, [place], Arther Abbott  Pager# [phone]

## 2018-06-08 NOTE — Progress Notes (Signed)
PROGRESS NOTE    [name]  TMH:[id number] DOB: Sep 07, 1941 DOA: 05/29/2018 PCP: Renato Shin, MD   Brief Narrative: [name] is a 77 y.o. malewith CHF (EF45-50%), pAF and hx of SSS s/p PPM (pacer-dependent) on eliquis, COPD, DMT2, CKD stage 2-3, and GBS bacteremia in January 2019 and lower extremity cellulitis. He presented after a fall and was found to have concern for cellulitis causing sepsis. He was started on IV antibiotics and an Haematologist. He also developed acute heart failure and is currently undergoing IV diuresis.   Assessment & Plan:   Principal Problem:   Severe sepsis (National) Active Problems:   A-fib (HCC)   CKD (chronic kidney disease), stage III (HCC)   Cellulitis   Hypoxia   Acute on chronic systolic heart failure (HCC)   Severe aortic stenosis   Severe sepsis Source is thought to be cellulitis. Resolved.  Cellulitis Bilateral legs. Currently on antibiotic therapy. Unna boots placed earlier. Cellulitis improved. Discontinued Vancomycin/Ceftriaxone. Unna boots replaced. -Discontinue doxycycline -PT recommendations: SNF, however, patient is refusing. Will plan for home with home health  Acute on chronic systolic heart failure Weight on admission of 312 lbs. BNP of 1000. UOP of 3.6 L over last 24 hours with a weight today of 303 lbs, down from 323 lbs on 6/25 but up 3 lbs from yesterday -Cardiology recommendations: IV diuresing. Recommendations pending today -Potassium supplementation while on IV lasix -Daily weights, strict in and out  Acute respiratory failure with hypoxia In setting of acute heart failure. Resolved. On room air.  Diabetes mellitus, insulin dependent Better controlled generally. Some significant hyperglycemia but now hypoglycemia. On QID dosing of Novolog as an outpatient. Not on a long-acting regimen. -Novolog 60 units qAC and continue to 40 units qHS -Continue SSI resistant while on steroids -Watch blood sugar while on  steroids  Atrial fibrillation Rate controlled. PPM in place. -Continue amiodarone -Continue Eliquis  Acute kidney injury on CKD stage 3 Baseline creatinine of 1.5, although, creatinine prior to admission was 2.02. 1.95 on admission. Down to 1.56 but now trended back up with diuresis.  Aortic stenosis Severe on recent Transthoracic Echocardiogram. -Cardiology recommendations: outpatient follow-up  Biatrial enlargement Right ventricular dilation Cardiology follow-up outpatient  Hypothyroidism -Continue Synthroid  Drug rash Most likely diagnosis. Ceftriaxone most likely culprit. Significantly improved -Continue Atarax -Prednisone taper  Leukocytosis Secondary to steroids. No continued/new evidence of infection. Stable.   DVT prophylaxis: Eliquis Code Status:   Code Status: DNR Family Communication: None at bedside Disposition Plan: Discharge to home pending improvement of heart failure   Consultants:   Cardiology  Procedures:   6/24: Echocardiogram Study Conclusions  - Left ventricle: The cavity size was mildly dilated. There was   moderate concentric hypertrophy. Systolic function was moderately   reduced. The estimated ejection fraction was in the range of 35%   to 40%. The study is not technically sufficient to allow   evaluation of LV diastolic function. - Ventricular septum: Septal motion showed abnormal function,   dyssynergy, and paradox. These changes are consistent with right   ventricular pacing. - Aortic valve: Valve mobility was restricted. There was severe   stenosis. Valve area (VTI): 0.91 cm^2. Valve area (Vmax): 0.95   cm^2. Valve area (Vmean): 0.92 cm^2. - Mitral valve: Severely calcified annulus. Transvalvular velocity   was minimally increased. The findings are consistent with trivial   stenosis. There was mild regurgitation. Valve area by continuity   equation (using LVOT flow): 1.81 cm^2. - Left  atrium: The atrium was moderately  dilated. - Right ventricle: The cavity size was moderately dilated. Systolic   function was mildly reduced. - Right atrium: The atrium was mildly to moderately dilated. - Pulmonary arteries: Systolic pressure was mildly increased. PA   peak pressure: 34 mm Hg (S).  Antimicrobials: 1. Zosyn (6/22>6/23) 2. Vancomycin (6/22>>6/28) 3. Ceftriaxone (6/24>>6/28) 4. Doxycycline (6/28>>7/2)   Subjective: Mild itching. Wants foley out.  Objective: Vitals:   06/07/18 2100 06/08/18 0519 06/08/18 0522 06/08/18 1036  BP: 116/74  101/60   Pulse:   73 68  Resp:   16 18  Temp: (!) 97.1 F (36.2 C)  97.8 F (36.6 C)   TempSrc: Oral  Oral   SpO2: 100%  100% 99%  Weight:  (!) 137.5 kg (303 lb 2.1 oz)    Height:        Intake/Output Summary (Last 24 hours) at 06/08/2018 1122 Last data filed at 06/08/2018 0519 Gross per 24 hour  Intake 510 ml  Output 3550 ml  Net -3040 ml   Filed Weights   06/06/18 0440 06/07/18 0533 06/08/18 0519  Weight: (!) 139.2 kg (306 lb 14.1 oz) (!) 136.2 kg (300 lb 4.8 oz) (!) 137.5 kg (303 lb 2.1 oz)    Examination:  General exam: Appears calm and comfortable Respiratory system: Diminished breath sounds. Respiratory effort normal. Cardiovascular system: S1 & S2 not clearly heard. Gastrointestinal system: Abdomen is non-distended but obese, soft and nontender. Normal bowel sounds heard. Central nervous system: Alert and oriented. No focal neurological deficits. Extremities: No edema. No calf tenderness Skin: No cyanosis. Macular rash over back, trunk, left forearm and left leg improved Psychiatry: Judgement and insight appear normal. Mood & affect appropriate.    Data Reviewed: I have personally reviewed following labs and imaging studies  CBC: Recent Labs  Lab 06/04/18 0822 06/05/18 0100 06/06/18 0416 06/07/18 0401 06/08/18 0436  WBC 9.7 12.4* 16.7* 14.6* 13.9*  HGB 10.2* 9.9* 10.2* 10.5* 10.1*  HCT 36.7* 34.7* 34.5* 36.2* 34.5*  MCV 81.6 79.8 78.4  78.2 78.9  PLT 255 274 321 364 259   Basic Metabolic Panel: Recent Labs  Lab 06/02/18 0334 06/03/18 0332 06/04/18 0406 06/04/18 0822 06/05/18 0100 06/06/18 0416 06/07/18 0401 06/08/18 0436  NA 141 144  --  137 138 140 137 138  K 4.0 3.5  --  4.0 4.5 4.7 4.3 4.6  CL 106 105  --  99 100 101 100 103  CO2 26 32  --  27 27 30 29 26   GLUCOSE 134* 41*  --  274* 195* 129* 136* 176*  BUN 26* 28*  --  28* 32* 44* 51* 59*  CREATININE 1.53* 1.70*  --  1.61* 1.68* 1.56* 1.69* 1.85*  CALCIUM 8.2* 9.0  --  8.8* 9.1 9.7 9.4 9.1  MG 2.1 2.2 2.1  --   --   --   --   --    GFR: Estimated Creatinine Clearance: 48.2 mL/min (A) (by C-G formula based on SCr of 1.85 mg/dL (H)). Liver Function Tests: Recent Labs  Lab 06/04/18 2346  AST 27  ALT 23  ALKPHOS 92  BILITOT 0.5  PROT 7.6  ALBUMIN 3.1*   No results for input(s): LIPASE, AMYLASE in the last 168 hours. No results for input(s): AMMONIA in the last 168 hours. Coagulation Profile: No results for input(s): INR, PROTIME in the last 168 hours. Cardiac Enzymes: No results for input(s): CKTOTAL, CKMB, CKMBINDEX, TROPONINI in the last 168 hours. BNP (  last 3 results) Recent Labs    10/26/17 1425  PROBNP 1,148*   HbA1C: No results for input(s): HGBA1C in the last 72 hours. CBG: Recent Labs  Lab 06/07/18 0735 06/07/18 1116 06/07/18 1658 06/07/18 2109 06/08/18 0735  GLUCAP 114* 287* 166* 227* 249*   Lipid Profile: No results for input(s): CHOL, HDL, LDLCALC, TRIG, CHOLHDL, LDLDIRECT in the last 72 hours. Thyroid Function Tests: No results for input(s): TSH, T4TOTAL, FREET4, T3FREE, THYROIDAB in the last 72 hours. Anemia Panel: No results for input(s): VITAMINB12, FOLATE, FERRITIN, TIBC, IRON, RETICCTPCT in the last 72 hours. Sepsis Labs: No results for input(s): PROCALCITON, LATICACIDVEN in the last 168 hours.  Recent Results (from the past 240 hour(s))  Blood culture (routine x 2)     Status: None   Collection Time: 05/29/18  10:03 PM  Result Value Ref Range Status   Specimen Description BLOOD RIGHT FOREARM  Final   Special Requests   Final    BOTTLES DRAWN AEROBIC AND ANAEROBIC Blood Culture adequate volume   Culture   Final    NO GROWTH 5 DAYS Performed at [facility], [address], [place], [place] [address]    Report Status 06/04/2018 FINAL  Final  Blood culture (routine x 2)     Status: None   Collection Time: 05/29/18 10:03 PM  Result Value Ref Range Status   Specimen Description BLOOD RIGHT FOREARM  Final   Special Requests   Final    BOTTLES DRAWN AEROBIC AND ANAEROBIC Blood Culture adequate volume   Culture   Final    NO GROWTH 5 DAYS Performed at [facility], [place] [address], [place], [place] [address]    Report Status 06/04/2018 FINAL  Final  MRSA PCR Screening     Status: Abnormal   Collection Time: 05/30/18  2:00 AM  Result Value Ref Range Status   MRSA by PCR POSITIVE (A) NEGATIVE Final    Comment:        The GeneXpert MRSA Assay (FDA approved for NASAL specimens only), is one component of a comprehensive MRSA colonization surveillance program. It is not intended to diagnose MRSA infection nor to guide or monitor treatment for MRSA infections. RESULT CALLED TO, READ BACK BY AND VERIFIED WITH: Heide Spark [id number]  BY V.WILKINS Performed at [facility] [address] [place], [place] [address]          Radiology Studies: No results found.      Scheduled Meds: . acidophilus  1 capsule Oral QPM  . amiodarone  200 mg Oral Daily  . apixaban  5 mg Oral BID  . bisacodyl  10 mg Oral Daily  . brimonidine  1 drop Left Eye TID  . dorzolamide  1 drop Both Eyes BID  . doxycycline  100 mg Oral Q12H  . fluticasone  2 spray Each Nare Daily  . furosemide  20 mg Intravenous BID  . guaiFENesin  600 mg Oral BID  . insulin aspart  0-20 Units Subcutaneous TID WC  . insulin aspart  60 Units Subcutaneous TID WC   And  . insulin aspart  40  Units Subcutaneous QHS  . lactulose  20 g Oral Once  . levothyroxine  75 mcg Oral QAC breakfast  . mometasone-formoterol  2 puff Inhalation BID  . potassium chloride  20 mEq Oral Daily  . predniSONE  20 mg Oral Q breakfast  . rosuvastatin  20 mg Oral QPM  . timolol  1 drop  Both Eyes BID  . Travoprost (BAK Free)  1 drop Left Eye QHS  . triamcinolone lotion   Topical TID   Continuous Infusions:    LOS: 9 days     Cordelia Poche, MD [facility] 06/08/2018, 11:22 AM Pager: [phone]  If 7PM-7AM, please contact night-coverage [email or web address] 06/08/2018, 11:22 AM

## 2018-06-08 NOTE — Progress Notes (Signed)
Lab appointment scheduled 7/9 (can come anytime between 7:30-4:30pm), and f/u scheduled 06/29/18 - appts have been limited in our office lately due to high patient volume and this was first APP available. Therefore would recommend he schedule appt with PCP to be seen within 1 week of dc for transition of care. I put this info on his AVS. Melina Copa PA-C

## 2018-06-08 NOTE — Discharge Summary (Signed)
Physician Discharge Summary  [name] KGU:[id number] DOB: 11-Mar-1941 DOA: 05/29/2018  PCP: Renato Shin, MD  Admit date: 05/29/2018 Discharge date: 06/08/2018  Admitted From: Home Disposition: Home  Recommendations for Outpatient Follow-up:  1. Follow up with PCP in 1 week 2. Follow up with cardiology 3. Please obtain BMP/CBC in one week 4. Please follow up on the following pending results: None  Home Health: RN, PT Equipment/Devices: None  Discharge Condition: Stable CODE STATUS: DNR Diet recommendation: Heart healthy   Brief/Interim Summary:  Admission HPI written by Colbert Ewing, MD   Chief Complaint: weakness, malaise, mechanical fall, and progressive R leg swelling/redness  HPI: [name] is a 77 y.o. male with CHF (EF45-50%), pAF and hx of SSS s/p PPM (pacer-dependent) on eliquis, COPD, DMT2, CKD stage 2-3, and GBS bacteremia in January 2019 and lower extremity cellulitis who presented by EMS after mechanical fall while trying to ambulate to the bathroom. No syncope or loss of consciousness. He has noted increasing right leg warmth and tenderness for the past 2-3 weeks and new blisters that would "pop" with yellowish drainage. For the past week, he has also felt increasing malaise and weakness. Mild increase in dyspnea from baseline. Does not require home O2 at baseline. Lives with wife at home.  Regarding group B strep bacteremia in January 2019: suspected source was lower extremity cellulitis. He has since completed PCN --> ceftriaxone via PICC. TEE was recommended to rule out endocarditis or PPM infection; however team decided against given risk of respiratory decompensation.   ED course:  BP 106/70 T 100.3; HR 74; SO2 99% on RA  Hospital course:  Severe sepsis Source is thought to be cellulitis. Resolved.  Cellulitis Bilateral legs. Currently on antibiotic therapy. Unna boots placed earlier. Cellulitis improved. Treated with Vancomycin/Ceftriaxone and  transitioned to doxycycline and completed course. Unna boots replaced. PT recommended SNF, however, patient wanted to go home. Home health ordered.  Acute on chronic systolic heart failure Patient treated with lasix IV. Good urine output and weight loss. Cardiology was consulted for management. Outpatient follow-up with cardiology on discharge. Dry weight of ~300 lbs  Acute respiratory failure with hypoxia In setting of acute heart failure. Resolved. On room air.  Diabetes mellitus, insulin dependent Better controlled generally. Some significant hyperglycemia but now hypoglycemia. On QID dosing of Novolog as an outpatient. Not on a long-acting regimen. Novolog 60 units qAC and continue to 40 units qHS while inpatient. Restart outpatient regimen on discharge since he will go back to his previous diet habits.  Atrial fibrillation Rate controlled. PPM in place. Continued amiodarone and Eliquis.  Acute kidney injury on CKD stage 3 Baseline creatinine of 1.5, although, creatinine prior to admission was 2.02. 1.95 on admission. Down to 1.56 but now trended back up with diuresis. Repeat as an outpatient.  Aortic stenosis Severe on recent Transthoracic Echocardiogram. Cardiology recommendations: outpatient follow-up  Biatrial enlargement Right ventricular dilation Cardiology follow-up outpatient  Hypothyroidism Continued Synthroid  Drug rash Most likely diagnosis. Ceftriaxone most likely culprit. Improved with IV/oral steroids. Continue taper at discharge. Atarax on discharge.  Leukocytosis Secondary to steroids. No continued/new evidence of infection. Stable.   Discharge Diagnoses:  Principal Problem:   Severe sepsis ([place]) Active Problems:   A-fib (HCC)   CKD (chronic kidney disease), stage III (HCC)   Cellulitis   Hypoxia   Acute on chronic systolic heart failure (HCC)   Severe aortic stenosis    Discharge Instructions  Discharge Instructions  AMB Referral to  [facility]   Complete by:  As directed    Please assign to [facility] for transition of care. Has COPD, CHF, DM. Please assign to [facility] Pharmacist for medication affordability (insulin, eliquis, eye drops). Written consent obtained. Initial bedside visits notes on 06/03/18. Slated for discharge today 06/08/18 with home health. Declined SNF placement. Please assess need for [facility] LCSW referral upon discharge calls. Please call with questions. Thanks. Marthenia Rolling, [place], RN,BSN-THN [facility] POEUMPN-[phone]   Reason for consult:  Please assign to [facility] Pharmacist   Diagnoses of:   Diabetes COPD/ Pneumonia Heart Failure     Expected date of contact:  1-3 days (reserved for hospital discharges)   Call MD for:  difficulty breathing, headache or visual disturbances   Complete by:  As directed    Diet - low sodium heart healthy   Complete by:  As directed    Increase activity slowly   Complete by:  As directed      Allergies as of 06/08/2018      Reactions   Actos [pioglitazone] Swelling   Timolol Other (See Comments)   Slow heart rate but tolerates Cosopt (dorzolamide-timolol) Pt has a rx for Timolol and uses it for glaucoma    Ceftriaxone Rash      Medication List    TAKE these medications   acetaminophen 500 MG tablet Commonly known as:  TYLENOL Take 1,000 mg by mouth 2 (two) times daily as needed (pain/headache).   Acidophilus Caps capsule Take 1 capsule by mouth every evening.   albuterol 108 (90 Base) MCG/ACT inhaler Commonly known as:  VENTOLIN HFA Inhale 2 puffs into the lungs every 6 (six) hours as needed for wheezing or shortness of breath.   amiodarone 200 MG tablet Commonly known as:  PACERONE Take 1 tablet (200 mg total) by mouth daily.   apixaban 5 MG Tabs tablet Commonly known as:  ELIQUIS Take 1 tablet (5 mg total) by mouth 2 (two) times daily.   brimonidine 0.1 % Soln Commonly known as:  ALPHAGAN P Place 1  drop into the left eye 3 (three) times daily.   budesonide-formoterol 80-4.5 MCG/ACT inhaler Commonly known as:  SYMBICORT Inhale 2 puffs into the lungs 2 (two) times daily.   cholecalciferol 1000 units tablet Commonly known as:  VITAMIN D Take 1,000 Units by mouth 2 (two) times daily.   CINNAMON PO Take 2,000 mg by mouth daily.   dextromethorphan 30 MG/5ML liquid Commonly known as:  DELSYM Take 90 mg by mouth 2 (two) times daily as needed for cough.   docusate sodium 100 MG capsule Commonly known as:  COLACE Take 100-200 mg by mouth daily as needed for mild constipation.   dorzolamide 2 % ophthalmic solution Commonly known as:  TRUSOPT Place 1 drop into both eyes 2 (two) times daily.   Fish Oil 1000 MG Caps Take 1,000 mg by mouth daily.   fluticasone 50 MCG/ACT nasal spray Commonly known as:  FLONASE Place 2 sprays into both nostrils daily.   furosemide 40 MG tablet Commonly known as:  LASIX Take 1 tablet (40 mg total) by mouth 2 (two) times daily.   guaiFENesin 200 MG tablet Take 400-600 mg by mouth 2 (two) times daily. 400mg  in the morning and 600mg  in the evening   hydrOXYzine 25 MG tablet Commonly known as:  ATARAX/VISTARIL Take 1 tablet (25 mg total) by mouth 3 (three) times daily as needed for  itching.   insulin regular 100 units/mL injection Commonly known as:  NOVOLIN R 4 times a day (just before each meal), 300-210-210-190 units What changed:    how much to take  how to take this  when to take this  additional instructions   ipratropium-albuterol 0.5-2.5 (3) MG/3ML Soln Commonly known as:  DUONEB Take 3 mLs by nebulization every 4 (four) hours as needed.   levothyroxine 75 MCG tablet Commonly known as:  SYNTHROID, LEVOTHROID Take 1 tablet (75 mcg total) by mouth daily before breakfast.   metoprolol tartrate 25 MG tablet Commonly known as:  LOPRESSOR Take 1 tablet (25 mg total) by mouth 2 (two) times daily.   MULTI-ENZYME Tabs Take 1  tablet by mouth daily.   multivitamin with minerals Tabs tablet Take 1 tablet by mouth every evening.   omeprazole 40 MG capsule Commonly known as:  PRILOSEC Take 1 capsule (40 mg total) by mouth daily.   potassium chloride 10 MEQ tablet Commonly known as:  K-DUR,KLOR-CON Take 1 tablet (10 mEq total) by mouth daily.   predniSONE 10 MG tablet Commonly known as:  DELTASONE Take 2 tablets (20 mg total) by mouth daily with breakfast for 1 day, THEN 1 tablet (10 mg total) daily with breakfast for 2 days. Start taking on:  06/09/2018   Resveratrol 100 MG Caps Take 100 mg by mouth every evening.   rosuvastatin 40 MG tablet Commonly known as:  CRESTOR Take 0.5 tablets (20 mg total) by mouth every evening.   Timolol Maleate 0.5 % (DAILY) Soln Place 1 drop into both eyes 2 (two) times daily.   traMADol 50 MG tablet Commonly known as:  ULTRAM TAKE 2 TABLETS BY MOUTH TWICE A DAY AS NEEDED FOR PAIN   travoprost (benzalkonium) 0.004 % ophthalmic solution Commonly known as:  TRAVATAN Place 1 drop into the left eye at bedtime.   vitamin C 500 MG tablet Commonly known as:  ASCORBIC ACID Take 500 mg by mouth daily.      Follow-up Information    [facility] Office Follow up.   Specialty:  Cardiology Why:  [facility] - Come to the office in on 06/15/18 for labwork. Disregard specific time below - you can come anytime between 7:30am and 4:30pm.  Contact information: [address], [address] [place] [phone]       Burtis Junes, NP Follow up.   Specialties:  Nurse Practitioner, Interventional Cardiology, Cardiology, Radiology Why:  [facility] - 06/29/18 at 11am. Arrive 15 minutes prior to appointment to check in. Contact information: [place] [place] [address] [phone]        Renato Shin, MD. Call in 1 week(s).   Specialty:  Endocrinology Why:  Within 1 week Contact information: [address] [place] [address] [phone]          Allergies  Allergen Reactions  . Actos [Pioglitazone] Swelling  . Timolol Other (See Comments)    Slow heart rate but tolerates Cosopt (dorzolamide-timolol) Pt has a rx for Timolol and uses it for glaucoma   . Ceftriaxone Rash    Consultations:  Cardiology   Procedures/Studies: Dg Chest 1 View  Result Date: 06/01/2018 CLINICAL DATA:  Hypoxia EXAM: CHEST  1 VIEW COMPARISON:  Chest x-ray of 05/29/2018 FINDINGS: The film is rather light in technique but there may be more opacity medially at the left lung base which could be due to atelectasis or  developing pneumonia. Two-view chest x-ray would be helpful. There does appear to have been some improvement in the degree of pulmonary vascular congestion. Cardiomegaly is stable. Pacemaker remains. IMPRESSION: Cannot exclude developing opacity medially at the left lung base. Some improvement in pulmonary vascular congestion. Electronically Signed   By: Ivar Drape M.D.   On: 06/01/2018 13:40   Dg Chest 2 View  Result Date: 05/29/2018 CLINICAL DATA:  Nausea, fever, shortness of breath, ex-smoker. EXAM: CHEST - 2 VIEW COMPARISON:  12/30/2017. FINDINGS: Enlarged cardiac silhouette with an interval decrease in size. Mildly prominent pulmonary vasculature. Clear lungs with resolved prominence of the interstitial markings. The lungs are mildly hyperexpanded with mild peribronchial thickening. Stable left subclavian pacemaker leads. Thoracic spine degenerative changes. IMPRESSION: 1. Mild cardiomegaly with improvement. 2. Mild pulmonary vascular congestion. 3. Resolved interstitial pulmonary edema. 4. Mild changes of COPD and chronic bronchitis. Electronically Signed   By: Claudie Revering M.D.   On: 05/29/2018 23:01   Ct Head Wo Contrast  Result Date: 05/29/2018 CLINICAL DATA:  [place] and hit his head. EXAM: CT HEAD WITHOUT CONTRAST TECHNIQUE: Contiguous axial images were obtained from the  base of the skull through the vertex without intravenous contrast. COMPARISON:  04/26/2008. FINDINGS: Brain: Diffusely enlarged ventricles and subarachnoid spaces. Patchy white matter low density in both cerebral hemispheres. No intracranial hemorrhage, mass lesion or CT evidence of acute infarction. Vascular: No hyperdense vessel or unexpected calcification. Skull: Normal. Negative for fracture or focal lesion. Sinuses/Orbits: Unremarkable paranasal sinuses. Status post bilateral cataract extraction and right globe band. Other: None. IMPRESSION: 1. No acute abnormality. 2. Mild diffuse cerebral and cerebellar atrophy. 3. Minimal chronic small vessel white matter ischemic changes in both cerebral hemispheres. Electronically Signed   By: Claudie Revering M.D.   On: 05/29/2018 23:10   Dg Chest Port 1 View  Result Date: 06/02/2018 CLINICAL DATA:  Dyspnea EXAM: PORTABLE CHEST 1 VIEW COMPARISON:  06/01/2018 FINDINGS: Cardiac shadow is stable. Pacing device is again seen. Lungs are well aerated bilaterally. Minimal left retrocardiac density is noted. No sizable effusion is seen. No acute bony abnormality is noted. IMPRESSION: Stable left retrocardiac atelectasis/early infiltrate. Electronically Signed   By: Inez Catalina M.D.   On: 06/02/2018 12:51     Transthoracic Echocardiogram (05/31/2018) Study Conclusions  - Left ventricle: The cavity size was mildly dilated. There was   moderate concentric hypertrophy. Systolic function was moderately   reduced. The estimated ejection fraction was in the range of 35%   to 40%. The study is not technically sufficient to allow   evaluation of LV diastolic function. - Ventricular septum: Septal motion showed abnormal function,   dyssynergy, and paradox. These changes are consistent with right   ventricular pacing. - Aortic valve: Valve mobility was restricted. There was severe   stenosis. Valve area (VTI): 0.91 cm^2. Valve area (Vmax): 0.95   cm^2. Valve area (Vmean):  0.92 cm^2. - Mitral valve: Severely calcified annulus. Transvalvular velocity   was minimally increased. The findings are consistent with trivial   stenosis. There was mild regurgitation. Valve area by continuity   equation (using LVOT flow): 1.81 cm^2. - Left atrium: The atrium was moderately dilated. - Right ventricle: The cavity size was moderately dilated. Systolic   function was mildly reduced. - Right atrium: The atrium was mildly to moderately dilated. - Pulmonary arteries: Systolic pressure was mildly increased. PA   peak pressure: 34 mm Hg (S).   Subjective: Itching improved today. Breathing improved.  Discharge Exam: Vitals:  06/08/18 1207 06/08/18 1209  BP: (!) 87/69 (!) 111/52  Pulse: (!) 146 70  Resp: 20 20  Temp:    SpO2: 96% 100%   Vitals:   06/08/18 0522 06/08/18 1036 06/08/18 1207 06/08/18 1209  BP: 101/60  (!) 87/69 (!) 111/52  Pulse: 73 68 (!) 146 70  Resp: 16 18 20 20   Temp: 97.8 F (36.6 C)     TempSrc: Oral     SpO2: 100% 99% 96% 100%  Weight:      Height:        General: Pt is alert, awake, not in acute distress Cardiovascular: RRR, S1/S2 +, no rubs, no gallops. Murmur Respiratory: CTA bilaterally but diminished, no wheezing, no rhonchi Abdominal: Soft, NT, ND, bowel sounds + Extremities: pitting LE edema, no cyanosis    The results of significant diagnostics from this hospitalization (including imaging, microbiology, ancillary and laboratory) are listed below for reference.     Microbiology: Recent Results (from the past 240 hour(s))  Blood culture (routine x 2)     Status: None   Collection Time: 05/29/18 10:03 PM  Result Value Ref Range Status   Specimen Description BLOOD RIGHT FOREARM  Final   Special Requests   Final    BOTTLES DRAWN AEROBIC AND ANAEROBIC Blood Culture adequate volume   Culture   Final    NO GROWTH 5 DAYS Performed at [facility], [address] [place], [place] [address]    Report Status 06/04/2018  FINAL  Final  Blood culture (routine x 2)     Status: None   Collection Time: 05/29/18 10:03 PM  Result Value Ref Range Status   Specimen Description BLOOD RIGHT FOREARM  Final   Special Requests   Final    BOTTLES DRAWN AEROBIC AND ANAEROBIC Blood Culture adequate volume   Culture   Final    NO GROWTH 5 DAYS Performed at [facility], [place] [address], [place], [place] [address]    Report Status 06/04/2018 FINAL  Final  MRSA PCR Screening     Status: Abnormal   Collection Time: 05/30/18  2:00 AM  Result Value Ref Range Status   MRSA by PCR POSITIVE (A) NEGATIVE Final    Comment:        The GeneXpert MRSA Assay (FDA approved for NASAL specimens only), is one component of a comprehensive MRSA colonization surveillance program. It is not intended to diagnose MRSA infection nor to guide or monitor treatment for MRSA infections. RESULT CALLED TO, READ BACK BY AND VERIFIED WITH: J.STRENK,RN [id number] @0707  BY V.WILKINS Performed at [facility] [address] [place], [place] [address]      Labs: BNP (last 3 results) Recent Labs    12/28/17 1751 05/29/18 2207 06/02/18 0334  BNP 767.4* 789.6* 3,295.1*   Basic Metabolic Panel: Recent Labs  Lab 06/02/18 0334 06/03/18 0332 06/04/18 0406 06/04/18 0822 06/05/18 0100 06/06/18 0416 06/07/18 0401 06/08/18 0436  NA 141 144  --  137 138 140 137 138  K 4.0 3.5  --  4.0 4.5 4.7 4.3 4.6  CL 106 105  --  99 100 101 100 103  CO2 26 32  --  27 27 30 29 26   GLUCOSE 134* 41*  --  274* 195* 129* 136* 176*  BUN 26* 28*  --  28* 32* 44* 51* 59*  CREATININE 1.53* 1.70*  --  1.61* 1.68* 1.56* 1.69* 1.85*  CALCIUM 8.2* 9.0  --  8.8* 9.1  9.7 9.4 9.1  MG 2.1 2.2 2.1  --   --   --   --   --    Liver Function Tests: Recent Labs  Lab 06/04/18 2346  AST 27  ALT 23  ALKPHOS 92  BILITOT 0.5  PROT 7.6  ALBUMIN 3.1*   No results for input(s): LIPASE, AMYLASE in the last 168 hours. No results for input(s):  AMMONIA in the last 168 hours. CBC: Recent Labs  Lab 06/04/18 0822 06/05/18 0100 06/06/18 0416 06/07/18 0401 06/08/18 0436  WBC 9.7 12.4* 16.7* 14.6* 13.9*  HGB 10.2* 9.9* 10.2* 10.5* 10.1*  HCT 36.7* 34.7* 34.5* 36.2* 34.5*  MCV 81.6 79.8 78.4 78.2 78.9  PLT 255 274 321 364 334   Cardiac Enzymes: No results for input(s): CKTOTAL, CKMB, CKMBINDEX, TROPONINI in the last 168 hours. BNP: Invalid input(s): POCBNP CBG: Recent Labs  Lab 06/07/18 1116 06/07/18 1658 06/07/18 2109 06/08/18 0735 06/08/18 1214  GLUCAP 287* 166* 227* 249* 330*   D-Dimer No results for input(s): DDIMER in the last 72 hours. Hgb A1c No results for input(s): HGBA1C in the last 72 hours. Lipid Profile No results for input(s): CHOL, HDL, LDLCALC, TRIG, CHOLHDL, LDLDIRECT in the last 72 hours. Thyroid function studies No results for input(s): TSH, T4TOTAL, T3FREE, THYROIDAB in the last 72 hours.  Invalid input(s): FREET3 Anemia work up No results for input(s): VITAMINB12, FOLATE, FERRITIN, TIBC, IRON, RETICCTPCT in the last 72 hours. Urinalysis    Component Value Date/Time   COLORURINE YELLOW 05/29/2018 2143   APPEARANCEUR CLEAR 05/29/2018 2143   LABSPEC 1.012 05/29/2018 2143   PHURINE 5.0 05/29/2018 2143   GLUCOSEU 50 (A) 05/29/2018 2143   GLUCOSEU 100 09/30/2013 1535   HGBUR MODERATE (A) 05/29/2018 2143   BILIRUBINUR NEGATIVE 05/29/2018 2143   [place] NEGATIVE 05/29/2018 2143   PROTEINUR 100 (A) 05/29/2018 2143   UROBILINOGEN 1.0 10/01/2013 1003   NITRITE NEGATIVE 05/29/2018 2143   LEUKOCYTESUR LARGE (A) 05/29/2018 2143   Sepsis Labs Invalid input(s): PROCALCITONIN,  WBC,  LACTICIDVEN Microbiology Recent Results (from the past 240 hour(s))  Blood culture (routine x 2)     Status: None   Collection Time: 05/29/18 10:03 PM  Result Value Ref Range Status   Specimen Description BLOOD RIGHT FOREARM  Final   Special Requests   Final    BOTTLES DRAWN AEROBIC AND ANAEROBIC Blood Culture  adequate volume   Culture   Final    NO GROWTH 5 DAYS Performed at [facility], [place] [address] [place], [place] [address]    Report Status 06/04/2018 FINAL  Final  Blood culture (routine x 2)     Status: None   Collection Time: 05/29/18 10:03 PM  Result Value Ref Range Status   Specimen Description BLOOD RIGHT FOREARM  Final   Special Requests   Final    BOTTLES DRAWN AEROBIC AND ANAEROBIC Blood Culture adequate volume   Culture   Final    NO GROWTH 5 DAYS Performed at [facility], [place] [address] [place], [place] [address]    Report Status 06/04/2018 FINAL  Final  MRSA PCR Screening     Status: Abnormal   Collection Time: 05/30/18  2:00 AM  Result Value Ref Range Status   MRSA by PCR POSITIVE (A) NEGATIVE Final    Comment:        The GeneXpert MRSA Assay (FDA approved for NASAL specimens only), is one component of a comprehensive MRSA colonization surveillance program. It is  not intended to diagnose MRSA infection nor to guide or monitor treatment for MRSA infections. RESULT CALLED TO, READ BACK BY AND VERIFIED WITH: Heide Spark [id number] @[phone]  BY V.WILKINS Performed at [facility] [address], [place], [place] [address]     SIGNED:   Cordelia Poche, MD [facility] 06/08/2018, 2:59 PM

## 2018-06-08 NOTE — Progress Notes (Addendum)
Progress Note  Patient Name: [name] Date of Encounter: 06/08/2018  Primary Cardiologist: Thompson Grayer, MD   Subjective   Patient is feeling okay.  Ready to go home.  No shortness of breath, no chest pain.  Inpatient Medications    Scheduled Meds: . acidophilus  1 capsule Oral QPM  . amiodarone  200 mg Oral Daily  . apixaban  5 mg Oral BID  . bisacodyl  10 mg Oral Daily  . brimonidine  1 drop Left Eye TID  . dorzolamide  1 drop Both Eyes BID  . fluticasone  2 spray Each Nare Daily  . furosemide  20 mg Intravenous BID  . guaiFENesin  600 mg Oral BID  . insulin aspart  0-20 Units Subcutaneous TID WC  . insulin aspart  60 Units Subcutaneous TID WC   And  . insulin aspart  40 Units Subcutaneous QHS  . lactulose  20 g Oral Once  . levothyroxine  75 mcg Oral QAC breakfast  . mometasone-formoterol  2 puff Inhalation BID  . potassium chloride  20 mEq Oral Daily  . predniSONE  20 mg Oral Q breakfast  . rosuvastatin  20 mg Oral QPM  . timolol  1 drop Both Eyes BID  . Travoprost (BAK Free)  1 drop Left Eye QHS  . triamcinolone lotion   Topical TID   Continuous Infusions:  PRN Meds: acetaminophen, calcium carbonate, [COMPLETED] hydrOXYzine **FOLLOWED BY** hydrOXYzine, ipratropium-albuterol, traMADol   Vital Signs    Vitals:   06/08/18 0522 06/08/18 1036 06/08/18 1207 06/08/18 1209  BP: 101/60  (!) 87/69 (!) 111/52  Pulse: 73 68 (!) 146 70  Resp: 16 18 20 20   Temp: 97.8 F (36.6 C)     TempSrc: Oral     SpO2: 100% 99% 96% 100%  Weight:      Height:        Intake/Output Summary (Last 24 hours) at 06/08/2018 1222 Last data filed at 06/08/2018 0519 Gross per 24 hour  Intake 510 ml  Output 2900 ml  Net -2390 ml   Filed Weights   06/06/18 0440 06/07/18 0533 06/08/18 0519  Weight: (!) 306 lb 14.1 oz (139.2 kg) (!) 300 lb 4.8 oz (136.2 kg) (!) 303 lb 2.1 oz (137.5 kg)    Telemetry    No adverse arrhythmias- Personally Reviewed  ECG     No new- Personally  Reviewed  Physical Exam   GEN: No acute distress.  Obese Neck: No JVD Cardiac: RRR, no murmurs, rubs, or gallops.  Respiratory: Clear to auscultation bilaterally. GI: Soft, nontender, non-distended  MS: No edema; No deformity. Neuro:  Nonfocal  Psych: Normal affect   Labs    Chemistry Recent Labs  Lab 06/04/18 2346  06/06/18 0416 06/07/18 0401 06/08/18 0436  NA  --    < > 140 137 138  K  --    < > 4.7 4.3 4.6  CL  --    < > 101 100 103  CO2  --    < > 30 29 26   GLUCOSE  --    < > 129* 136* 176*  BUN  --    < > 44* 51* 59*  CREATININE  --    < > 1.56* 1.69* 1.85*  CALCIUM  --    < > 9.7 9.4 9.1  PROT 7.6  --   --   --   --   ALBUMIN 3.1*  --   --   --   --  AST 27  --   --   --   --   ALT 23  --   --   --   --   ALKPHOS 92  --   --   --   --   BILITOT 0.5  --   --   --   --   GFRNONAA  --    < > 42* 38* 34*  GFRAA  --    < > 48* 44* 39*  ANIONGAP  --    < > 9 8 9    < > = values in this interval not displayed.     Hematology Recent Labs  Lab 06/06/18 0416 06/07/18 0401 06/08/18 0436  WBC 16.7* 14.6* 13.9*  RBC 4.40 4.63 4.37  HGB 10.2* 10.5* 10.1*  HCT 34.5* 36.2* 34.5*  MCV 78.4 78.2 78.9  MCH 23.2* 22.7* 23.1*  MCHC 29.6* 29.0* 29.3*  RDW 21.3* 21.3* 21.1*  PLT 321 364 334    Cardiac EnzymesNo results for input(s): TROPONINI in the last 168 hours. No results for input(s): TROPIPOC in the last 168 hours.   BNP Recent Labs  Lab 06/02/18 0334  BNP 1,005.6*     DDimer No results for input(s): DDIMER in the last 168 hours.   Radiology    No results found.  Cardiac Studies   EF 35 to 40%, severe aortic stenosis  Patient Profile     77 y.o. male with acute on chronic systolic heart failure EF 35 to 40% with morbid obesity, paroxysmal atrial fibrillation, severe aortic stenosis, pacemaker.  Assessment & Plan    Acute on chronic systolic heart failure - Weight relatively stable now 300 pounds.  Total output during admission is 18.9 L.  BUN  and creatinine have now started to increase more significantly.  We will stop IV diuresis.  Resume p.o. Lasix 40 mg twice a day at home.  Check basic metabolic profile in 1 week. -I am comfortable with his discharge.  Paroxysmal atrial fibrillation -Amiodarone 200 mg once a day.  No changes.  Chronic anticoagulation - Eliquis 5 mg twice a day.  Diabetes with hypertension - Per primary team, no changes.  Severe aortic stenosis -TAVR work-up as outpatient.  Make sure that he is clear of infection.  Pacemaker - Could consider CRT upgrade.  Patient was not amenable to this previously.  Sepsis - IV antibiotics  Discussed with patient.  I am comfortable with his discharge.  He needs to avoid salt, liter and a half fluid restriction daily.  [facility] will sign off.   Medication Recommendations: Holding Lasix today.  Start back on home Lasix 40 mg twice a day at home. Other recommendations (labs, testing, etc): Check basic metabolic profile in 1 week. Follow up as an outpatient: We will set up follow-up with Truitt Merle  For questions or updates, please contact [facility] Please consult [email or web address]       Signed, Candee Furbish, MD  06/08/2018, 12:22 PM

## 2018-06-08 NOTE — Plan of Care (Signed)
  Problem: Pain Managment: Goal: General experience of comfort will improve Outcome: Progressing   Problem: Skin Integrity: Goal: Risk for impaired skin integrity will decrease Outcome: Progressing   Problem: Elimination: Goal: Will not experience complications related to bowel motility Outcome: Progressing

## 2018-06-08 NOTE — Progress Notes (Addendum)
06/08/18  1530  Reviewed discharge instructions with patient. Patient verbalized understanding of discharge instructions. Copy of discharge instructions and prescriptions given to patient.

## 2018-06-08 NOTE — Consult Note (Signed)
   [facility] CM Inpatient Consult   06/08/2018  [name] Nov 03, 1941 [id number]   [facility] Liaison follow up.   Went to bedside to speak with [name] and wife. Mrs. Levene reports patient will be discharging home today. Noted Mr. Rohl will have home health with [facility] as well. As previously discussed on 06/03/18 bedside visit, will make referral for [facility] Pharmacist.    Marthenia Rolling, [place], RN,BSN [facility] Liaison [phone]

## 2018-06-08 NOTE — Discharge Instructions (Signed)
[name],  You were admitted with cellulitis and a heart failure exacerbation. You also developed a rash which was likely from an antibiotic, Ceftriaxone (a cephalosporin antibiotic). Please follow-up with your primary care physician and heart doctor.

## 2018-06-09 ENCOUNTER — Encounter: Payer: Self-pay | Admitting: Pharmacist

## 2018-06-09 ENCOUNTER — Ambulatory Visit: Payer: Medicare HMO | Admitting: Endocrinology

## 2018-06-09 ENCOUNTER — Other Ambulatory Visit: Payer: Self-pay | Admitting: *Deleted

## 2018-06-09 ENCOUNTER — Encounter: Payer: Self-pay | Admitting: *Deleted

## 2018-06-09 ENCOUNTER — Other Ambulatory Visit: Payer: Self-pay | Admitting: Pharmacist

## 2018-06-09 NOTE — Patient Outreach (Signed)
[facility]) Care Management  06/09/2018  [name] 16-Mar-1941 [id number]   77 year old male referred to [place] [facility] by [facility] inpatient liaison for medication assistance and transition of care services.  Per notes, patient has difficulty affording insulin, apixaban, and eye drops.  PMHx includes, but not limited to, COPD, CKD-III, T2DM with renal insufficiency and polyneuropathy, hx pancreatitis, HTN, HLD, CAD, SSS s/p PPM, morbid obesity, OSA, chronic diastolic and systolic heart failure (EF 45-50%),  atrial fibrillation, severe glaucoma, with recent hospitalization due to sepsis 2/2 cellulitis and acute on chronic heart failure exacerbation.   [facility] pharmacy familiar with patient as he was referred for medication assistance with insulin in 2018.    Successful call to [name] today.  HIPAA identifiers verified. [name] is agreeable to [facility] pharmacy services.   Subjective: Patient reports he is doing "ok" post-hospitalization.  He confirms he has difficulty paying for several medications but is not hopeful that he will be eligible for any programs as he reports he has tried many times in the past unsuccessfully.    Objective: Hemoglobin A1C = 6.8 (03/10/2018).   SCr = 1.85 (06/08/2018)  Medications Reviewed Today    Reviewed by Rudean Haskell, RPH (Pharmacist) on 06/09/18 at [place] List Status: <None>  Medication Order Taking? Sig Documenting Provider Last Dose Status Informant  acetaminophen (TYLENOL) 500 MG tablet [id number] Yes Take 1,000 mg by mouth 2 (two) times daily as needed (pain/headache).  [provider] Taking Active Spouse/Significant Other  albuterol (VENTOLIN HFA) 108 (90 Base) MCG/ACT inhaler [id number] Yes Inhale 2 puffs into the lungs every 6 (six) hours as needed for wheezing or shortness of breath. Parrett, Fonnie Mu, NP Taking Active Spouse/Significant Other  amiodarone (PACERONE) 200 MG tablet [id number] Yes Take 1 tablet (200 mg total)  by mouth daily. Burtis Junes, NP Taking Active Spouse/Significant Other  apixaban (ELIQUIS) 5 MG TABS tablet [id number] Yes Take 1 tablet (5 mg total) by mouth 2 (two) times daily. Burtis Junes, NP Taking Active Spouse/Significant Other           Med Note [name], Ladell Heads May 30, 2018  1:03 AM) Pt takes am and pm, 12 hours apart, no specified times.  Ascorbic Acid (VITAMIN C) 500 MG tablet [id number] Yes Take 500 mg by mouth daily.  [provider] Taking Active Spouse/Significant Other  brimonidine (ALPHAGAN P) 0.1 % SOLN [id number] Yes Place 1 drop into the left eye 3 (three) times daily.  [provider] Taking Active Spouse/Significant Other           Med Note Tamsen Snider   Wed Jan 06, 2018  2:55 PM)    budesonide-formoterol (SYMBICORT) 80-4.5 MCG/ACT inhaler [id number] No Inhale 2 puffs into the lungs 2 (two) times daily.  Patient not taking:  Reported on 06/09/2018   Melvenia Needles, NP Not Taking Active Spouse/Significant Other  cholecalciferol (VITAMIN D) 1000 UNITS tablet [id number] Yes Take 1,000 Units by mouth daily.  [provider] Taking Active Spouse/Significant Other  CINNAMON PO [id number] Yes Take 2,000 mg by mouth daily. [provider] Taking Active Spouse/Significant Other  dextromethorphan (DELSYM) 30 MG/5ML liquid [id number] Yes Take 90 mg by mouth 2 (two) times daily as needed for cough. [provider] Taking Active Spouse/Significant Other  Digestive Enzymes (MULTI-ENZYME) TABS [id number] Yes Take 1 tablet by mouth daily. [provider] Taking Active Spouse/Significant Other  docusate sodium (COLACE) 100 MG  capsule [id number] Yes Take 100-200 mg by mouth daily as needed for mild constipation.  [provider] Taking Active Spouse/Significant Other  dorzolamide (TRUSOPT) 2 % ophthalmic solution [id number] Yes Place 1 drop into both eyes 2 (two) times daily. [provider] Taking Active  Spouse/Significant Other  fluticasone (FLONASE) 50 MCG/ACT nasal spray [id number] Yes Place 2 sprays into both nostrils as needed (congestion).  [provider] Taking Active Spouse/Significant Other  furosemide (LASIX) 40 MG tablet [id number] Yes Take 1 tablet (40 mg total) by mouth 2 (two) times daily. Burtis Junes, NP Taking Active Spouse/Significant Other  guaiFENesin 200 MG tablet [id number] Yes Take 400 mg by mouth 2 (two) times daily. 400mg  in the morning and 600mg  in the evening  [provider] Taking Active Spouse/Significant Other  hydrOXYzine (ATARAX/VISTARIL) 25 MG tablet [id number] Yes Take 1 tablet (25 mg total) by mouth 3 (three) times daily as needed for itching. Mariel Aloe, MD Taking Active   insulin regular (NOVOLIN R) 100 units/mL injection [id number] Yes 4 times a day (just before each meal), 300-210-210-190 units  Patient taking differently:  Inject 150-300 Units into the skin 4 (four) times daily. 4 times a day (just before each meal), 300-200-200-200 units   Renato Shin, MD Taking Active Spouse/Significant Other        Discontinued 06/09/18 1525 (Completed Course)   Lactobacillus (ACIDOPHILUS) CAPS capsule [id number] Yes Take 1 capsule by mouth every evening.  [provider] Taking Active Spouse/Significant Other  levothyroxine (SYNTHROID, LEVOTHROID) 75 MCG tablet [id number] Yes Take 1 tablet (75 mcg total) by mouth daily before breakfast. Renato Shin, MD Taking Active Spouse/Significant Other  metoprolol tartrate (LOPRESSOR) 25 MG tablet [id number] Yes Take 1 tablet (25 mg total) by mouth 2 (two) times daily. Burtis Junes, NP Taking Active Spouse/Significant Other  Multiple Vitamin (MULTIVITAMIN WITH MINERALS) TABS tablet [id number] Yes Take 1 tablet by mouth every evening.  [provider] Taking Active Spouse/Significant Other  Omega-3 Fatty Acids (FISH OIL) 1000 MG CAPS [id number] Yes Take 1,000 mg by mouth daily.  [provider] Taking Active Spouse/Significant Other  omeprazole (PRILOSEC) 40 MG capsule [id number] Yes Take 1 capsule (40 mg total) by mouth daily. Renato Shin, MD Taking Active Spouse/Significant Other  potassium chloride SA (K-DUR,KLOR-CON) 10 MEQ tablet [id number] Yes Take 1 tablet (10 mEq total) by mouth daily. Mariel Aloe, MD Taking Active   predniSONE (DELTASONE) 10 MG tablet [id number] Yes Take 2 tablets (20 mg total) by mouth daily with breakfast for 1 day, THEN 1 tablet (10 mg total) daily with breakfast for 2 days. Mariel Aloe, MD Taking Active   Resveratrol 100 MG CAPS [id number] Yes Take 100 mg by mouth as needed.  [provider] Taking Active Spouse/Significant Other  rosuvastatin (CRESTOR) 40 MG tablet [id number] Yes Take 0.5 tablets (20 mg total) by mouth every evening. Renato Shin, MD Taking Active Spouse/Significant Other  Timolol Maleate 0.5 % (DAILY) SOLN [id number] Yes Place 1 drop into both eyes 2 (two) times daily. [provider] Taking Active Spouse/Significant Other  traMADol (ULTRAM) 50 MG tablet [id number] Yes TAKE 2 TABLETS BY MOUTH TWICE A DAY AS NEEDED FOR PAIN Renato Shin, MD Taking Active Spouse/Significant Other  travoprost, benzalkonium, (TRAVATAN) 0.004 % ophthalmic solution [id number] Yes Place 1 drop into the left eye at bedtime. [provider] Taking Active Spouse/Significant Other            ASSESSMENT: Date Discharged from Hospital: 06/08/18 Date Medication Reconciliation  Performed: 06/09/18  Patient was recently discharged from hospital and all medications have been reviewed.  Medications Discontinued at Discharge:  None  New Medications at Discharge:   Hydroxyzine  Potassium  Prednisone  Drugs sorted by system:  Neurologic/Psychologic:hydroxyzine  Cardiovascular: amiodarone, apixaban, furosemide, metoprolol, omega-3 fatty acids, rosuvastatin  Pulmonary/Allergy: albuterol, fluticasone, guaifenesin,  dextromethorphan  Gastrointestinal:docusate, lactobacillus, omeprazole  Endocrine:insulin regular, levothyroxine, prednisone  Renal:none  Topical: brimonidine, dorzolamide, timolol, travoprost  Pain: acetaminophen, tramadol  Vitamins/Minerals: ascorbic acid, cholecalciferol, cinnamon, vitamin D, MVI, potassium, resveratrol  Infectious Diseases:none  Miscellaneous:none  Medication management:  Timolol dose currently is BID, usual recommended dosage per PI is once daily.  I will clarify with patient at next phone call.   Patient reports he only picked up "a few days" of potassium.  He has lab work scheduled 7/9.      Patient checks his blood sugar regularly.  He has a 3 day course of prednisone therefore blood sugars will likely be elevated until he stops the steroid.   Medication assistance: Patient requests assistance with Apixaban, Novolin R, Alphagan, and Travatan Z.  He believes he has spent >$1000 TROOP thus far in 2019.   Extra Help / LIS: Patient reports he is not eligible based on income.    PAPs:  Apixaban ([employer]): Requires 3% of annual income TROOP  Novolin R [employer]): $1000 TROOP, may require denial letter from LIS  Alphagan ([place]): No TROOP, may require denial letter from [place] [employer]): No TROOP  Plan: I will route patient assistance letter to pharmacy technician, Etter Sjogren, who will coordinate application process for Apixaban, Novolin, Alphagan, and Travatan Z.  She will assist with obtaining all pertinent documents from both patient and providers and submit applications once completed.    Medications reviewed with Mr. Huguley. I will follow-up with him regarding timolol dosing on Friday and to complete LIS application as denial letter may be required for specific PAPs.  Ralene Bathe, PharmD, [place] [phone]

## 2018-06-09 NOTE — Patient Outreach (Signed)
[facility]) Care Management [facility] Telephone Outreach Transition of care day 1  06/09/2018  [name] 02-06-1941 [id number]  Successful telephone outreach to [name], 77 y/o male referred to [place] for transition of care after recent hospitalization June 22- June 08, 2018 for severe sepsis secondary to lower extremity edema/ cellulitis related to CHF exacerbation; patient had acute respiratory failure with hypoxia during hospitalization.  Patient was discharged home to self-care with home health health services in place, as patient refused SNF rehabilitation placement post-hospital discharge.  Patient has history including, but not limited to, DM- Type II; Atrial Fibrillation with previously placed pacemaker; CKD- stage III; aortic stenosis; COPD- on CPAP; CAD; HLD; and GERD.  HIPAA/ identity verified with patient during phone call today.  [place] services were discussed with patient, including how [facility] CCM services differ from home health services; today patient confirms/ provides verbal consent for [facility] CCM involvement his care-- [facility] CM written consent was obtained at bedside by [facility] RN [facility] on June 03, 2018.  Today, patient reports that he "is doing better" after recent hospitalization; reports chronic pain in back that is "manageable" with currently prescribed medications.  Patient denies new/ recent falls, and he sounds to be in no distress throughout today's 30 minute phone call.  Patient further reports:  Medications: -- Has all medicationsand takes as prescribed;denies questions about current medications, stating/ confirming that he spoke with [name] earlier this afternoon; confirms that pharmacist reviewed all medications with him and discussed his concerns around medication assistance with him; encouraged patient to maintain communication with [facility] Pharmacist regarding his stated financial concerns with medications.    Home health [facility]) services: -- noted through review of EMR that [facility] services for PT, RN, bath aide/ CNA ordered through [facility]) care post-hospital discharge -- patient reports he was "not aware that home health services were ordered" post-hospital discharge; I confirmed with patient that these orders were placed by discharging physician -- reports he has not currently heard from [facility] team-- states that with a previous hospitalization, he had [facility] team involved; verbalizes appropriate understanding of purpose of home health services, however, patient declines today taking phone number for [name] agency.  Patient states that he would prefer to "wait for them" to contact him first.  Discussed with patient need to promptly engage with [facility] team and to actively participate in all ordered [facility] services; patient stated that when these services were ordered with a previous hospitalization, he had to 'pay a co-pay" and he is "not sure" that he will be able to afford another co-pay for these services.  I explained that when the [facility] team contacts him, they will be able to answer his specific questions around any insurance co-pay fees; I explained that these services are very valuable post-hospital discharge in assisting patient to recuperate; patient stated he will "consider" once he hears from home health team -- discussed with patient that I would be able to assist in initiating home health services for him, and he agrees to discuss further with next [name] outreach, after he gives [name] team "a chance to contact" him first.  Provider appointments: -- All upcoming provider appointments were reviewed with patient today-- noted that patient does not have PCP appointment scheduled until end of month; patient stated he contacted PCP ([name]) office and was told that "this was the earliest appointment" they were able to schedule for patient -- discussed  with patient value of seeing PCP sooner, and patient  declined my offer to assist in attempting this, but stated that he would consider over weekend, and "let me know" if he changes his mind and would like assistance with attempt to re-schedule appointment sooner -- reports wife will provide transportation to all scheduled provider appointments  Safety/ Mobility/ Falls: -- denies new/ recent falls -- assistive devices: currently using walker, "all the time;" reports "feels steady" with ambulation post-hospital discharge -- general fall risks/ prevention education discussed with patient today  Social/ Community Resource needs: -- currently denies community resource needs, stating supportive family members that assist with care needs as indicated -- states his wife, primary caregiver, also "has disabilities," and is able to assist in his daily care on limited basis; reports that he is essentially independent in self-care/ daily ADL's -- wife provides transportation for patient to all provider appointments, errands, etc-- denies transportation needs now, states "may need" in future  Advanced Directive (AD) Planning:   --reports currently has exisisting AD in place for living will HCPOA; reports that he "took these documents" to previous PCP where he believes they "in the computer" -- denies desire to make changes in currently established AD's  Self-health management of chronic disease state of CHF: -- reports has had CHF "a long time;" however, stated that he "was just told during this hospital visit" about importance of monitoring/ recording daily weights -- discussed with patient value of/ rationale for daily weight monitoring at home, weight gain guidelines in setting of CHF with patient; patient reports that he curently has a scale at his home but "is not sure" that it 'registers as high as" his weight is-- discussed options availble to patient to obtain new scales; patient stated that he prefers to contact his insurance provider first to see if  this is a benefit of his plan; I shared with patient that if necessary, I could facilitate obtaining new scales for patient as well. -- Uses CPAP at night; "has used for a long time" -- "Breathing much better, less short of breath" since recent hospitalization/ discharge; denies use of supplemental O2 at home -- lower extremity swelling "better," but "still present"  Patient denies further issues, concerns, or problems today, and states that he would like to go ahead and contact his insurance provider "today" before it gets "after hours" to discuss obtaining new scales.  I provided/ confirmed that patient has my direct phone number, the main [facility] CM office phone number, and the [facility] CM 24-hour nurse advice phone number should issues arise prior to next scheduled [place] outreach by phone early next week.  Encouraged patient to contact me directly if needs, questions, issues, or concerns arise prior to next scheduled outreach; patient agreed to do so.  Plan:  Patient will take medications as prescribed and will attend all scheduled provider appointments  Patient will promptly notify care providers for any new concerns/ issues/ problems that arise  Patient will actively engage with/ participate in home health services as ordered post-hospital discharge  Patient will continue begin monitoring/ recording daily weights and will contact his insurance provider to obtain details of coverage for new scales  I will make patient's PCP aware of [place] RN CM involvement in patient's care-- will send barriers letter  [place] outreach to continue with scheduled phone call next week  [facility] CM Care Plan Problem One     Most Recent Value  Care Plan Problem One Risk for  hospital re-admission related to/ as evidenced by recent hospitalization for sepsis secondary to cellulitis from CHF  Role Documenting the Problem One  Care Management [place] for Problem One  Active  [facility]  Long Term Goal   Over the next 31 days, patient will not experience hospital readmission, as evidenced by patient reporting and review of EMR during [facility] RN CCM outreach  [place] Term Goal Start Date  06/09/18  Interventions for Problem One Long Term Goal  Discussed with patient recent hospitalization, current clinical condition post-hospital discharge, upcoming provider appointments,  confirmed that patient has spoken with [facility] pharmacists and that current medications post-hospital discharge and medication concerns were reviewed with patient by pharmacist earlier today  [facility] CM Short Term Goal #1   Over the next 10 days, patient will engage with home health agency for initiation of home health services as ordered post-hospital discharge, as evidenced by patient reporting and collaboration with home health team as indicated, during [place] outreach  [facility] CM Short Term Goal #1 Start Date  06/09/18  Interventions for Short Term Goal #1  Discussed currently ordered home health services in place for patient, per hospital discharge instructions,  discussed with patient difference between [facility] CCM and home health services, and encouraged patient to fully and activel engage and participate with home health team once they contact him to establish services  [facility] CM Short Term Goal #2   Over the next 30 days, patient will attend all scheduled provider appointments, as evidenced by patient reporting, review of EMR, and collaboration with care providers as indisctaed, during [place] outreach  [facility] CM Short Term Goal #2 Start Date  06/09/18  Interventions for Short Term Goal #2  Reviewed upcoming provider appointments with patient, along with recommendations of prompt provider follow up, post-hospital discharge,  encouraged patient to attend all scheduled provider appointments and confirmed that patient has transportation to all appointments,  discussed options for transportation assistance, if needed in future with  patient    [facility] CM Care Plan Problem Two     Most Recent Value  Care Plan Problem Two  Knowledge deficit around self-health management of chronic disease state of CHF, as evidenced by patient reporting  Role Documenting the Problem Two  Care Management Coordinator  Care Plan for Problem Two  Active  Interventions for Problem Two Long Term Goal   Discussed value/ rationale of daily weight monitoring with patient,  discussed patient's concerns with his current scale at home, and encouraged him to contact his insurance provider regarding obtaining new scale at home,  provided possible alternative options for patient to obtain scale, should this not be one of his insurance benefits,  briefly introduced concept of weight gain guidelines in setting of CHF with patient  [facility] Long Term Goal  Over the next 31 days, patient will begin monitoring and recording daily weights, as evidenced by patient reporting and review of same during [place] outreach  [facility] Long Term Goal Start Date  06/09/18     I appreciate the opportunity to participate in Mr. Teagle care,  Oneta Rack, RN, BSN, [place] [employer] Coordinator [facility] Care Management  [phone]

## 2018-06-11 ENCOUNTER — Other Ambulatory Visit: Payer: Self-pay | Admitting: Pharmacy Technician

## 2018-06-11 ENCOUNTER — Other Ambulatory Visit: Payer: Self-pay | Admitting: Pharmacist

## 2018-06-11 ENCOUNTER — Ambulatory Visit: Payer: Self-pay | Admitting: Pharmacist

## 2018-06-11 NOTE — Patient Outreach (Signed)
[facility]) Care Management  06/11/2018  MARTON MALIZIA 29-Apr-1941 [id number]   Received patient assistance referral from [place]. Prepared patient portions to be mailed and faxed provider portions of the following: [place] (Eliquis) Dr. Servando Snare, Novo Nordisk (Novolin R) Dr. [name], Allergan and Novartis (Alphagan P and Travatan Z) to Dr. Kathlen Mody.   Jaclyn Shaggy Summe will follow up with patient in 5-7 business days to confirm patient has received applications.  Maud Deed [place], [place] Management [phone]

## 2018-06-11 NOTE — Patient Outreach (Signed)
[name]  06/11/2018  [name] [birth year] [id number]   Successful call today to [name]. Patient has obtained all financial paperwork.  He has not received the [employer] EOB yet for June but is aware to save this once it arrives for the patient assistance program (PAP) applications.  Extra Help [employer] application online started however as it is asking for spouse's financial information, patient declines completing further.  As his income and savings are far above requirements, I do no believe it will be necessary for him to provide a denial letter from [employer] for PAPs.  I reviewed with patient that he should receive the patient portion of applications for Apixaban, Novolin, Alphagan, and Travatan in the next week.  I will contact him in 7-10 business days to verify receipt.    Plan: Contact patient in 7-10 business days to verify he has received all applications and to ensure he understands how to complete them.   Ralene Bathe, PharmD, [place] [phone]

## 2018-06-14 ENCOUNTER — Encounter: Payer: Self-pay | Admitting: *Deleted

## 2018-06-14 ENCOUNTER — Telehealth: Payer: Self-pay

## 2018-06-14 ENCOUNTER — Other Ambulatory Visit: Payer: Self-pay | Admitting: *Deleted

## 2018-06-14 ENCOUNTER — Telehealth: Payer: Self-pay | Admitting: Endocrinology

## 2018-06-14 DIAGNOSIS — R6520 Severe sepsis without septic shock: Principal | ICD-10-CM

## 2018-06-14 DIAGNOSIS — A419 Sepsis, unspecified organism: Secondary | ICD-10-CM

## 2018-06-14 NOTE — Telephone Encounter (Signed)
Can HH take verbal order for now, and send paperwork for me to sign? Pt needs f/u ov next week.

## 2018-06-14 NOTE — Telephone Encounter (Signed)
I have completed the provider part of the BMS pt asst application that was faxed to the office from [facility]. It was faxed over to be completed and signed by Truitt Merle, NP but Cecille Rubin is out of the office this week so I have placed it in Dr Bonita Quin mail bin awaiting his signature.

## 2018-06-14 NOTE — Telephone Encounter (Signed)
[place] Management ph# [phone] called re: Patient needs new Order for [place] has been used in the past-at hospital discharge the following was ordered:Physical Therapy, RN, and bath aide - sepsis related to lower extremity cellulitis. [facility] is telling the patient they do not have an order. Richarda Osmond is trying to coordinate this-the Order needs to be placed from the PCP to [place]. [facility] needs an Order. Also, patient needs to be seen by Dr. [name] asap-per hospital discharge instructions.

## 2018-06-14 NOTE — Telephone Encounter (Signed)
These orders need to be placed. [name] is not a doctor & cannot put in orders. He has been out of the hospital been for 6 days. Something has fell through the cracks she feels & PCP needs to order for patient. Can you place these orders?

## 2018-06-14 NOTE — Patient Outreach (Signed)
[name]) [name] Management [facility] Community [name] Telephone Outreach, Transition of [name] day [place] Telephone Outreach [name] Coordination x 4   06/14/2018  [name] [birth year] [id number]  Successful telephone outreach to [name], 77 y/o male referred to [facility] for transition of [name] after recent hospitalization June 22- June 08, 2018 for severe sepsis secondary to lower extremity edema/ cellulitis related to CHF exacerbation; patient had acute respiratory failure with hypoxia during hospitalization.  Patient was discharged home to self-[name] with home health health services in place, as patient refused SNF rehabilitation placement post-[name] discharge.  Patient has history including, but not limited to, DM- Type II; Atrial Fibrillation with previously placed pacemaker; CKD- stage III; aortic stenosis; COPD- on CPAP; CAD; HLD; and GERD.  HIPAA/ identity verified with patient during phone call today.  Today, patient reports that he "is still doing okay/ had a pretty good weekend" after recent hospitalization; today, patient denies pain and new recent falls; patient sounds to be in no apparent/ obvious distress throughout entirety of 25 minute phone call today.  Patient further reports:  Medications: -- Has all medicationsand takes as prescribed;denies questions about current medications   Home health [name]) services: -- previously noted through review of EMR that [name] services for PT, RN, bath aide/ CNA ordered through [facility] [name] post-[name] discharge; patient reports today that he has not yet heard from home health team; provided patient [name] phone numbers for [facility] (both local and 800 numbers)-- patient agreed to contact home health agency promptly after our phone call.  Encouraged patient to actively participate in home health services post-[name] discharge.  11:25:  Patient returned my call shortly after we had ended our conversation;  stated that he had contacted [name] who told him that "no order was received" for home health services post-[name] discharge; discussed with patient that I would attempt to facilitate new order being placed for these services through his [name] office; patient agreeable to this.  11:35 am: [name] Coordination outreach placed to [name] on both local and 800- numbers; multiple outreach attempts were placed-- with each attempt on both numbers, I was placed on extended automated hold, without physical pick up from [name] agency team  11:55 am:  [name] placed to "[name]" at [name] ([name]) office; explained to [facility] that patient had been recently discharged from [name] with home health orders for PT, RN, and bath aide:  Explained that patient was told by [place] [name] team that there were no orders in place at their agency for initiation of home health services; also discussed with [name] that if possible, patient should be seen for post-[name] discharge appointment with [name] sooner than what is currently scheduled, per [name] discharge instructions; [name] agreed to send message to [name] nurse for follow up  4:00 pm:  [name] Coordination incoming outreach from "[name]" for [name] ([name]):  Clarified with [name] need for home health services to be re-ordered post-[name] discharge and need for sooner [name] office visit post-[name] discharge; [name] aware and will facilitate; confirmed that she has sent request to [name]   Provider appointments: -- Upcoming provider appointments were reviewed with patient today-- patient verbalizes plans to attend appointment tomorrow for scheduled lab work; wife to transport to this appointment -- discussed with patient that I would reach out to [name] in an effort to facilitate patient obtaining sooner post-[name] discharge appointment with [name] than is currently scheduled- patient agreeable (see above [name]  coordination outreach notes to [name])    Self-health management of chronic disease state of CHF: -- previously discussed with patient value of/ rationale for daily weight monitoring at home, weight gain guidelines in setting of CHF-- patient repots today that he did contact his health insurance provider, who told him that they "do not send scales to patients;" discussed with patient that I would attempt to facilitate having scales delivered to his home through [name]-- encouraged patient to promptly begin daily weight monitoring/ recording once scales arrive-- patient verbalized understanding and agreement -- weight gain guidelines in setting of CHF reiterated with patient:  To contact [name] or cardiologist for weight gain > 3 lbs overnight/ 5 lbs in one week; patient verbalizes understanding and agreement -- continues using CPAP at night, "breating is still much better;" reports today that he believes he is "not having much shortness of breath" since being released from [name]; "gets less winded" with ambulation around his home.  Reports "feels less tired than" he did prior to recent hospitalization -- has started using compression hose that were already at his home: reports "legs appear less swollen, but are still red and inflamed, but better" since [name] discharge -- endorses following "heart healthy, no salt diet" "for a long time now" -- briefly discussed with patient need to promptly notify [name] providers for increased shortness of breath, swelling- patient verbalizes understanding and agreement  5:15 pm: [name] Coordination outreach via secure e-mail to [name], [name], requesting delivery of scales to patient's home   Patient denies further issues, concerns, or problems today, and we agreed to another phone call to follow up on home health status and scale delivery at end of this week; we also scheduled [facility] CCM RN initial home visit for next week.  I confirmed that patient hasmy direct phone number, [name] main [facility] [name]  office phone number, and [name] [name] [name] 24-hour nurse advice phone number should issues arise prior to next scheduled [place] [name] outreach by phone later this week.  Encouraged patient to contact me directly if needs, questions, issues, or concerns arise prior to next scheduled outreach; patient agreed to do so.  Plan:  Patient will take medications as prescribed and will attend all scheduled provider appointments  Patient will promptly notify [name] providers for any new concerns/ issues/ problems that arise  Patient will actively engage with/ participate in home health services once they are successfully ordered post-[name] discharge  Patient will continue begin monitoring/ recording daily weights once he obtains new scales  [facility] Community [name] outreach to continue with scheduled phone later this week   [place] [name] [name] [name] Plan Problem One     Most Recent Value  [name] Plan Problem One  Risk for [name] re-admission related to/ as evidenced by recent hospitalization for sepsis secondary to cellulitis from CHF  Role Documenting [name] Problem One  [name] Management Coordinator  [name] Plan for Problem One  Active  THN Long Term Goal   Over [name] next 31 days, patient will not experience [name] readmission, as evidenced by patient reporting and review of EMR during [place] outreach  [place] Term Goal Start Date  06/09/18  Interventions for Problem One Long Term Goal  TOC call placed to patient,  discussed current clinical condition with patient.  Discussed with patient his call last week to his health insurance provider regarding obtaining new scales,  placed [name] coordination outreach to [place] to arrange for patient to have scales delivered  by [place] [name] [name],  discussed with patient weight gain guidelines in setting of CHF/ value of obtaining and recording daily weights,  scheduled THN RN CCM initial home visit for next week  THN [name] Short Term Goal #1   Over [name] next 10 days, patient will engage with home  health agency for initiation of home health services as ordered post-[name] discharge, as evidenced by patient reporting and collaboration with home health team as indicated, during [place] outreach  [facility] [name] Short Term Goal #1 Start Date  06/09/18  Interventions for Short Term Goal #1  Discussed with patient that he has not yet heard from home health agency post-[name] discharge,  discussed importance of home health services being initiaed,  provided patient number for home health agency as ordered post-[name] discharge by inpatient [name] team,  placed [name] coordination outreach to patient's [name] office to have these services re-ordered after receiving follow up from patient that he was told by home health agency that there was no order placed during hospitalization  THN [name] Short Term Goal #2   Over [name] next 30 days, patient will attend all scheduled provider appointments, as evidenced by patient reporting, review of EMR, and collaboration with [name] providers as indisctaed, during [place] outreach  [name] [name] Short Term Goal #2 Start Date  06/09/18  Interventions for Short Term Goal #2  Confirmed that patient plans to attend scheduled appointment for lab tomorrow,  placed [name] coordination outreach to patient's [name] in effort to obtain sooner office visit with [name] post-[name] discharge    [facility] [place] [name] [name] [name] Plan Problem Two     Most Recent Value  [name] Plan Problem Two  Knowledge deficit around self-health management of chronic disease state of CHF, as evidenced by patient reporting  Role Documenting [name] Problem Two  [name] Management Coordinator  [name] Plan for Problem Two  Active  Interventions for Problem Two Long Term Goal   reiterated/ discussed rationale and value of daily weight monitoring in setting of CHF,  discussed weight gain guidelines to report to provider,  discussed with patient that I would facilitate having new scales delivered to him through [place] Term Goal  Over [name] next 31 days,  patient will begin monitoring and recording daily weights, as evidenced by patient reporting and review of same during [place] outreach  [place] [name] Term Goal Start Date  06/09/18     Oneta Rack, RN, BSN, [employer] Coordinator [facility] [name] Management  [phone]

## 2018-06-14 NOTE — Telephone Encounter (Signed)
**Note De-Identified  Obfuscation** Dr Rayann Heman has signed the BMS pt asst application and I have faxed it back to [place]: Etter Sjogren, CPhT.

## 2018-06-14 NOTE — Telephone Encounter (Signed)
OK 

## 2018-06-15 ENCOUNTER — Other Ambulatory Visit: Payer: Medicare HMO

## 2018-06-15 NOTE — Progress Notes (Signed)
This encounter was created in error - please disregard.

## 2018-06-16 ENCOUNTER — Other Ambulatory Visit: Payer: Medicare HMO

## 2018-06-16 DIAGNOSIS — I5043 Acute on chronic combined systolic (congestive) and diastolic (congestive) heart failure: Secondary | ICD-10-CM | POA: Diagnosis not present

## 2018-06-16 NOTE — Telephone Encounter (Signed)
Ok, I placed order

## 2018-06-16 NOTE — Telephone Encounter (Signed)
Patient notified

## 2018-06-16 NOTE — Telephone Encounter (Signed)
Spoke with Mikeal Hawthorne at [facility] and since this patient has not used this service in a very long time [facility] will need a written order as if he were a new patient- a good call-back number is [phone] ext [phone]- patient has already been discharged from the hospital

## 2018-06-17 ENCOUNTER — Telehealth: Payer: Self-pay

## 2018-06-17 LAB — BASIC METABOLIC PANEL
BUN/Creatinine Ratio: 23 (ref 10–24)
BUN: 51 mg/dL — ABNORMAL HIGH (ref 8–27)
CALCIUM: 8.6 mg/dL (ref 8.6–10.2)
CO2: 16 mmol/L — AB (ref 20–29)
CREATININE: 2.21 mg/dL — AB (ref 0.76–1.27)
Chloride: 98 mmol/L (ref 96–106)
GFR calc Af Amer: 32 mL/min/{1.73_m2} — ABNORMAL LOW (ref >59)
GFR calc non Af Amer: 28 mL/min/{1.73_m2} — ABNORMAL LOW (ref >59)
Glucose: 176 mg/dL — ABNORMAL HIGH (ref 65–99)
Potassium: 4.3 mmol/L (ref 3.5–5.2)
SODIUM: 139 mmol/L (ref 134–144)

## 2018-06-17 MED ORDER — FUROSEMIDE 40 MG PO TABS: 40.0000 mg | ORAL_TABLET | Freq: Every day | ORAL | 3 refills | Status: DC

## 2018-06-17 NOTE — Telephone Encounter (Signed)
Pt aware of lab results and recommendations. Pt will hold lasix today and begin 40mg  qd starting 7/12. Pt agrees to see Ellen Henri on 7/16 per DD's request. Pt understands to see help from ER this weekend if he has s/s of fluid overload. ------  Notes recorded by Charlie Pitter, PA-C on 06/17/2018 at 8:06 AM EDT Dr. Marlou Porch requested that this BMET be obtained following post-hospital discharge, recently with extended admission for CHF and sepsis. Cr has begun to rise again. As long as home weight has been stable and feeling OK, would suggest holding Lasix for one day then resuming at lower dose 40mg  daily until seen back in clinic. Please move follow-up up. Lurena Joiner and [employer] both have 72 hour slots on Tuesday 7/16 and he will qualify for this to keep him out of te hospital.   However - If he has begun to see evidence of fluid overload back (weight gain, SOB, more swelling), given rising creatinine, would instead suggest he go back to ER to have labs trended and review next steps. Dayna Dunn PA-C

## 2018-06-18 ENCOUNTER — Other Ambulatory Visit: Payer: Self-pay | Admitting: *Deleted

## 2018-06-18 ENCOUNTER — Encounter: Payer: Self-pay | Admitting: *Deleted

## 2018-06-18 DIAGNOSIS — R262 Difficulty in walking, not elsewhere classified: Secondary | ICD-10-CM | POA: Diagnosis not present

## 2018-06-18 DIAGNOSIS — L03115 Cellulitis of right lower limb: Secondary | ICD-10-CM | POA: Diagnosis not present

## 2018-06-18 DIAGNOSIS — J449 Chronic obstructive pulmonary disease, unspecified: Secondary | ICD-10-CM | POA: Diagnosis not present

## 2018-06-18 DIAGNOSIS — L03116 Cellulitis of left lower limb: Secondary | ICD-10-CM | POA: Diagnosis not present

## 2018-06-18 DIAGNOSIS — N183 Chronic kidney disease, stage 3 (moderate): Secondary | ICD-10-CM | POA: Diagnosis not present

## 2018-06-18 DIAGNOSIS — Z794 Long term (current) use of insulin: Secondary | ICD-10-CM | POA: Diagnosis not present

## 2018-06-18 DIAGNOSIS — J9601 Acute respiratory failure with hypoxia: Secondary | ICD-10-CM | POA: Diagnosis not present

## 2018-06-18 DIAGNOSIS — E1122 Type 2 diabetes mellitus with diabetic chronic kidney disease: Secondary | ICD-10-CM | POA: Diagnosis not present

## 2018-06-18 DIAGNOSIS — I5023 Acute on chronic systolic (congestive) heart failure: Secondary | ICD-10-CM | POA: Diagnosis not present

## 2018-06-18 NOTE — Patient Outreach (Addendum)
[place] [facility]) Care Management THN Community CM Telephone Outreach, Transition of Care day [place] Telephone Outreach Care Coordination x 2   06/18/2018  [name] [birth year] [id number]  Successful telephone outreach to [name], 77 y/o male referred to [place] for transition of care after recent hospitalization June 22- June 08, 2018 for severe sepsis secondary to lower extremity edema/ cellulitis related to CHF exacerbation; patient had acute respiratory failure with hypoxia during hospitalization. Patient was discharged home to self-care with home health health services in place, as patient refused SNF rehabilitation placement post-hospital discharge. Patient has history including, but not limited to, DM- Type II; Atrial Fibrillation with previously placed pacemaker; CKD- stage III; aortic stenosis; COPD- on CPAP; CAD; HLD; and GERD. HIPAA/ identity verified with patient during phone call today.  Today, patient reports that he "is doing okay/ but still weak" after recent hospitalization; today, patient denies pain and new recent falls; patient sounds to be in no apparent/ obvious distress throughout entirety of phone call today.  Patient further reports:  Medications: -- Has all medicationsand takes as prescribed;denies questions about current medications and is able to accurately verbalize recent changes in diuretic therapy, as instructed by cardiology provider yesterday -- confirmed that he received packet in mail from [name] regarding possible assistance with medications--- encouraged patient to promptly complete these forms and to actively engage with [name] Pharmacist; patient verbalizes agreement  Home health [name]) services: --previously noted through review of EMR [name] services for PT, RN, bath aide/ CNA [name]) care post-hospital discharge; patient previously reported today that he had not heard  from home health team; care coordination outreach was placed to PCP 06/14/18 to establish home health orders: patient confirms today that he has heard from home health nurse and that he has scheduled visit with her later this afternoon; confirms that he has contact information for home health team.  Encouraged patient to actively participate in home health services post-hospital discharge now that they are ordered, patient verbalizes agreement.   Provider appointments: -- Upcoming provider appointments were reviewed with patient today-- patient attended appointment for lab work this week, and stated that it was very difficult for him to get to office, as he became "very winded and tired" with waking to office; encouraged patient to obtain a wheelchair if necessary from office staff to assist him getting into office; patient stated he would do; confirms that he plans to attend scheduled cardiology office visit next week on Tuesday 06/22/18 -- has not yet heard back form PCP office about possibility of scheduling sooner office visit post-hospital discharge; stated "still waiting to hear."  Self-health management ofchronic disease state of CHF: --previously discussed with patient value of/ rationale for daily weight monitoring at home, weight gain guidelines in setting of CHF-- confirmed with patient that I had placed request for new scales to be delivered to his home; patient reports have not yet arrived; encouraged patient to look out for scale delivery and to set up scales promptly once they arrive and to begin monitoring and recording daily weights-- patient verbalizes understanding and agreement and states he will do.  Patient able to accurately verbalize weight gain guidelines in setting of CHF to report weight gain > 3 lbs overnight/ 5 lbs in one week to care providers.  Encouraged patient to take any home recorded daily weights to upcoming cardiology office visit next week- patient stated he would do if  scales arrive prior  to scheduled office visit.  -- continues using CPAP at night, "breating is still better;" reports today that he believes he is "not having much shortness of breath" since being released from hospital; "gets less winded" with ambulation around his home, but again stated that "going to doctor's office" was difficult on his breathing.   -- briefly discussed with patient need to promptly notify care providers for increased shortness of breath, swelling- patient verbalizes understanding and agreement  Patient denies further issues, concerns, or problems today, and I confirmed that patient hasmy direct phone number, the main [name] CM office phone number, and the [facility] CM 24-hour nurse advice phone number should issues arise prior to next scheduled [place] previously scheduled initial home visit next week/ confirmed with patient today. Encouraged patient to contact me directly if needs, questions, issues, or concerns arise prior to next scheduled outreach; patient agreed to do so.  Care Coordination outreach x 2 Addendum:  3:50 pm: Received incoming call/ voice mail message from patient, stating that he would like for me to return his call so he could cancel [name] CM services; in his message, patient stated that a nurse visited him at his home today and told him that no other nurses could be involved in his care; returned patient's call immediately; HIPAA/ identity verified.  Again explained to patient that [name] services are different from home health [name]) services, and that both CM and [facility] team work together; patient asked that I contact the [name] RN that visited with him today from [name]- he provided her number as [place].  Patient very anxious that [facility] is also involved-- I explained to patient that earlier this week, when I contacted his PCP t obtain order for home health, that the PCP apparently ordered these  services from [name] (not [facility]); patient appreciative of call back and verbalized understanding by end of phone call.  4:00 pm: Contacted [name], [name] agency [phone])-- [name] confirmed that she visited with patient and that she also explained to him that [place] services were not any conflict; stated that patient seemed confused, and thought that [facility] was also ordered-- clarified with [name] that patient had worked with [facility] in the past, but that (to my knowledge today) this agency was not currently involved in his care.  [name] stated that she was the intake assessment nurse and had ordered [name] nursing and PT services for patient twice weekly; she confirmed that his weight at home today registered on his home scale at 296 lbs.  Provided my direct contact information to [facility] for future collaboration as indicated  Plan:  Patient will take medications as prescribed and will attend all scheduled provider appointments  Patient will promptly notify care providers for any new concerns/ issues/ problems that arise  Patient will activelyengage with/participate in home health services as ordered post-hospital discharge  Patient willbeginmonitoring/ recording daily weightsonce he obtains new scales  [place] outreach for transition of care to continue with scheduled initial home visit next week  [facility] CM Care Plan Problem One     Most Recent Value  Care Plan Problem One  Risk for hospital re-admission related to/ as evidenced by recent hospitalization for sepsis secondary to cellulitis from CHF  Role Documenting the Problem One  Care Management Coordinator  Care Plan for Problem One  Active  THN Long Term Goal   Over the next  31 days, patient will not experience hospital readmission, as evidenced by patient reporting and review of EMR during [place] outreach  [place] Term Goal Start Date  06/09/18  Interventions  for Problem One Long Term Goal  Discussed current clinical status with patient and confirmed that he spoke with cardiologist office yesterday and is able to verbalize recent changes made to diuretic therapy,  confirmed that patient plans to attend upcoming cardiology office visit next week,  confirmed with patient [facility] RN CCM initial home visit scheduled for next week  THN CM Short Term Goal #1   Over the next 10 days, patient will engage with home health agency for initiation of home health services as ordered post-hospital discharge, as evidenced by patient reporting and collaboration with home health team as indicated, during [place] outreach  [facility] CM Short Term Goal #1 Start Date  06/09/18  [facility] CM Short Term Goal #1 Met Date  06/18/18 - Goal met  Interventions for Short Term Goal #1  Confirmed through review of EMR and with patient verbally that he has heard from home health agency and that nurse is scheduled to visit him at his home this afternoon,  confirmed that patient has contact information for home health agency,  encouraged patient's active participation with home health services as ordered post-hospital discharge  THN CM Short Term Goal #2   Over the next 30 days, patient will attend all scheduled provider appointments, as evidenced by patient reporting, review of EMR, and collaboration with care providers as indisctaed, during [place] outreach  [facility] CM Short Term Goal #2 Start Date  06/09/18  Interventions for Short Term Goal #2  Reviewed upcoming scheduled provider appointments with patient and confirmed that he has reliable transportation through family members and plans to attend upcoming scheduled cardiology appointment next week    [facility] CM Care Plan Problem Two     Most Recent Value  Care Plan Problem Two  Knowledge deficit around self-health management of chronic disease state of CHF, as evidenced by patient reporting  Role Documenting the Problem Two  Care Management Coordinator   Care Plan for Problem Two  Active  Interventions for Problem Two Long Term Goal   Confirmed with patient that I had placed request for scales to be delivered to his home and encouraged patient to begin daily monitoring and recording promptly once scales arrive,  reiterated with patient weight gain guidelines in setting of CHF  THN Long Term Goal  Over the next 31 days, patient will begin monitoring and recording daily weights, as evidenced by patient reporting and review of same during [place] outreach  [place] Term Goal Start Date  06/09/18     Oneta Rack, RN, BSN, [employer] Coordinator [facility] Care Management  [phone]

## 2018-06-21 DIAGNOSIS — L03115 Cellulitis of right lower limb: Secondary | ICD-10-CM | POA: Diagnosis not present

## 2018-06-21 DIAGNOSIS — R262 Difficulty in walking, not elsewhere classified: Secondary | ICD-10-CM | POA: Diagnosis not present

## 2018-06-21 DIAGNOSIS — E1122 Type 2 diabetes mellitus with diabetic chronic kidney disease: Secondary | ICD-10-CM | POA: Diagnosis not present

## 2018-06-21 DIAGNOSIS — N183 Chronic kidney disease, stage 3 (moderate): Secondary | ICD-10-CM | POA: Diagnosis not present

## 2018-06-21 DIAGNOSIS — L03116 Cellulitis of left lower limb: Secondary | ICD-10-CM | POA: Diagnosis not present

## 2018-06-21 DIAGNOSIS — Z794 Long term (current) use of insulin: Secondary | ICD-10-CM | POA: Diagnosis not present

## 2018-06-21 DIAGNOSIS — J9601 Acute respiratory failure with hypoxia: Secondary | ICD-10-CM | POA: Diagnosis not present

## 2018-06-21 DIAGNOSIS — J449 Chronic obstructive pulmonary disease, unspecified: Secondary | ICD-10-CM | POA: Diagnosis not present

## 2018-06-21 DIAGNOSIS — I5023 Acute on chronic systolic (congestive) heart failure: Secondary | ICD-10-CM | POA: Diagnosis not present

## 2018-06-22 ENCOUNTER — Encounter: Payer: Self-pay | Admitting: Cardiology

## 2018-06-22 ENCOUNTER — Ambulatory Visit: Payer: Self-pay | Admitting: Pharmacist

## 2018-06-22 ENCOUNTER — Ambulatory Visit: Payer: Medicare HMO | Admitting: Cardiology

## 2018-06-22 ENCOUNTER — Other Ambulatory Visit: Payer: Self-pay | Admitting: Pharmacist

## 2018-06-22 VITALS — BP 104/54 | HR 71 | Ht 70.0 in | Wt 298.0 lb

## 2018-06-22 DIAGNOSIS — R5383 Other fatigue: Secondary | ICD-10-CM

## 2018-06-22 DIAGNOSIS — Z79899 Other long term (current) drug therapy: Secondary | ICD-10-CM

## 2018-06-22 LAB — CBC WITH DIFFERENTIAL/PLATELET
BASOS ABS: 0 10*3/uL (ref 0.0–0.2)
BASOS: 0 %
EOS (ABSOLUTE): 0.3 10*3/uL (ref 0.0–0.4)
Eos: 3 %
Hematocrit: 32.4 % — ABNORMAL LOW (ref 37.5–51.0)
Hemoglobin: 9.8 g/dL — ABNORMAL LOW (ref 13.0–17.7)
IMMATURE GRANS (ABS): 0 10*3/uL (ref 0.0–0.1)
Immature Granulocytes: 0 %
Lymphocytes Absolute: 2.1 10*3/uL (ref 0.7–3.1)
Lymphs: 24 %
MCH: 23.8 pg — AB (ref 26.6–33.0)
MCHC: 30.2 g/dL — AB (ref 31.5–35.7)
MCV: 79 fL (ref 79–97)
MONOCYTES: 12 %
Monocytes Absolute: 1.1 10*3/uL — ABNORMAL HIGH (ref 0.1–0.9)
NEUTROS ABS: 5.4 10*3/uL (ref 1.4–7.0)
NEUTROS PCT: 61 %
PLATELETS: 209 10*3/uL (ref 150–450)
RBC: 4.12 x10E6/uL — ABNORMAL LOW (ref 4.14–5.80)
RDW: 22 % — ABNORMAL HIGH (ref 12.3–15.4)
WBC: 9 10*3/uL (ref 3.4–10.8)

## 2018-06-22 LAB — BASIC METABOLIC PANEL
BUN / CREAT RATIO: 15 (ref 10–24)
BUN: 25 mg/dL (ref 8–27)
CHLORIDE: 104 mmol/L (ref 96–106)
CO2: 18 mmol/L — ABNORMAL LOW (ref 20–29)
Calcium: 8.5 mg/dL — ABNORMAL LOW (ref 8.6–10.2)
Creatinine, Ser: 1.62 mg/dL — ABNORMAL HIGH (ref 0.76–1.27)
GFR calc Af Amer: 47 mL/min/{1.73_m2} — ABNORMAL LOW (ref >59)
GFR calc non Af Amer: 41 mL/min/{1.73_m2} — ABNORMAL LOW (ref >59)
GLUCOSE: 217 mg/dL — AB (ref 65–99)
POTASSIUM: 3.4 mmol/L — AB (ref 3.5–5.2)
SODIUM: 138 mmol/L (ref 134–144)

## 2018-06-22 NOTE — Patient Instructions (Signed)
Your physician recommends that you continue on your current medications as directed. Please refer to the Current Medication list given to you today.   Your physician recommends that you return for lab work in:  [place] have been referred to [place]

## 2018-06-22 NOTE — Progress Notes (Signed)
06/22/2018 [name]   07-20-1941  [id number]  Primary Physician Renato Shin, MD Primary Cardiologist: Dr. Rayann Heman   Reason for Visit/CC: Post hospital F/u for acute on chronic systolic HF and Aortic Stenosis   HPI:  [name] is a 77 y.o. male who presents to clinic today for post hospital f/u. 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 Rayann Heman. He was admitted last month, in June, for LE cellulitis and found to have worsening LV systolic function and now severe aortic stenosis.  A repeat echo was ordered which showed a decline in LVEF from 45% down to 35 to 40%.  There is moderate concentric LVH with pacemaker related dyssynergy of the septum but no regional wall motion abnormalities.  The aortic valve was severely stenosed with a mean gradient of around 0.9 cm -mean gradient 41 mmHg and peak gradient of 71 mmHg.   Both Dr. Debara Pickett and Dr. Marlou Porch outlined in their hospital notes that they did not think that he would be a SAVR candidate, but may be a TAVR candidate. It was recommended to refer to valve clinic as outpatient for consideration, once his cellulitis had resolved.   His HF was treated with IV Lasix, then transitioned to PO. He was continued on metoprolol. No ACE/ARB due to CKD and soft BP. Amiodarone and Eliquis continued for afib. He was discharge home on 40 mg of Lasix BID. He had a f/u BMP 1 week later which showed increase in SCr from 1.85 to 2.21. He was instructed by our office to reduce Lasix to 40 mg once daily. He was instructed to get a f/u BMP today.   He presents now for f/u. He is in exam room alone. He notes he lives with his wife. He is obese and in a wheelchair but can ambulate with a walker. His main limitation is marked fatigue and weakness with excercise. No significant CP or dyspnea. He  has been monitoring his weight at home, which has been stable between 295-298 lb. His hospital d/c weight was 300lb. He reports full med compliance. He has home health services arranged. He is still contemplating if he even wants to have valve replacement surgery but is in agreement with consultation. He has completed his course of antibiotics for cellulitis. Denies fever and chills.     Cardiac Studies 2D Echo 05/2018 Study Conclusions  - Left ventricle: The cavity size was mildly dilated. There was moderate concentric hypertrophy. Systolic function was moderately reduced. The estimated ejection fraction was in the range of 35% to 40%. The study is not technically sufficient to allow evaluation of LV diastolic function. - Ventricular septum: Septal motion showed abnormal function, dyssynergy, and paradox. These changes are consistent with right ventricular pacing. - Aortic valve: Valve mobility was restricted. There was severe stenosis. Valve area (VTI): 0.91 cm^2. Valve area (Vmax): 0.95 cm^2. Valve area (Vmean): 0.92 cm^2. - Mitral valve: Severely calcified annulus. Transvalvular velocity was minimally increased. The findings are consistent with trivial stenosis. There was mild regurgitation. Valve area by continuity equation (using LVOT flow): 1.81 cm^2. - Left atrium: The atrium was moderately dilated. - Right ventricle: The cavity size was moderately dilated. Systolic function was mildly reduced. - Right atrium: The atrium was mildly to moderately dilated. - Pulmonary arteries: Systolic pressure was mildly increased. PA peak pressure: 34 mm Hg (  S).    Current Meds  Medication Sig  . acetaminophen (TYLENOL) 500 MG tablet Take 1,000 mg by mouth 2 (two) times daily as needed (pain/headache).   Marland Kitchen albuterol (VENTOLIN HFA) 108 (90 Base) MCG/ACT inhaler Inhale 2 puffs into the lungs every 6 (six) hours as needed for wheezing or shortness of breath.  Marland Kitchen  amiodarone (PACERONE) 200 MG tablet Take 1 tablet (200 mg total) by mouth daily.  Marland Kitchen apixaban (ELIQUIS) 5 MG TABS tablet Take 1 tablet (5 mg total) by mouth 2 (two) times daily.  . Ascorbic Acid (VITAMIN C) 500 MG tablet Take 500 mg by mouth daily.   . brimonidine (ALPHAGAN P) 0.1 % SOLN Place 1 drop into the left eye 3 (three) times daily.   . cholecalciferol (VITAMIN D) 1000 UNITS tablet Take 1,000 Units by mouth daily.   Marland Kitchen CINNAMON PO Take 2,000 mg by mouth daily.  Marland Kitchen dextromethorphan (DELSYM) 30 MG/5ML liquid Take 90 mg by mouth 2 (two) times daily as needed for cough.  . Digestive Enzymes (MULTI-ENZYME) TABS Take 1 tablet by mouth daily.  Marland Kitchen docusate sodium (COLACE) 100 MG capsule Take 100-200 mg by mouth daily as needed for mild constipation.   . dorzolamide (TRUSOPT) 2 % ophthalmic solution Place 1 drop into both eyes 2 (two) times daily.  . fluticasone (FLONASE) 50 MCG/ACT nasal spray Place 2 sprays into both nostrils as needed (congestion).   . furosemide (LASIX) 40 MG tablet Take 1 tablet (40 mg total) by mouth daily.  Marland Kitchen guaiFENesin 200 MG tablet Take 400 mg by mouth 2 (two) times daily. 400mg  in the morning and 600mg  in the evening   . insulin regular (NOVOLIN R) 100 units/mL injection 4 times a day (just before each meal), 300-210-210-190 units (Patient taking differently: Inject 150-300 Units into the skin 4 (four) times daily. 4 times a day (just before each meal), 300-200-200-200 units)  . Lactobacillus (ACIDOPHILUS) CAPS capsule Take 1 capsule by mouth every evening.   Marland Kitchen levothyroxine (SYNTHROID, LEVOTHROID) 75 MCG tablet Take 1 tablet (75 mcg total) by mouth daily before breakfast.  . metoprolol tartrate (LOPRESSOR) 25 MG tablet Take 1 tablet (25 mg total) by mouth 2 (two) times daily.  . Multiple Vitamin (MULTIVITAMIN WITH MINERALS) TABS tablet Take 1 tablet by mouth every evening.   . Omega-3 Fatty Acids (FISH OIL) 1000 MG CAPS Take 1,000 mg by mouth daily.   Marland Kitchen omeprazole  (PRILOSEC) 40 MG capsule Take 1 capsule (40 mg total) by mouth daily.  Marland Kitchen Resveratrol 100 MG CAPS Take 100 mg by mouth as needed.   . rosuvastatin (CRESTOR) 40 MG tablet Take 0.5 tablets (20 mg total) by mouth every evening.  . Timolol Maleate 0.5 % (DAILY) SOLN Place 1 drop into both eyes 2 (two) times daily.  . traMADol (ULTRAM) 50 MG tablet TAKE 2 TABLETS BY MOUTH TWICE A DAY AS NEEDED FOR PAIN  . travoprost, benzalkonium, (TRAVATAN) 0.004 % ophthalmic solution Place 1 drop into the left eye at bedtime.   Allergies  Allergen Reactions  . Actos [Pioglitazone] Swelling  . Timolol Other (See Comments)    Slow heart rate but tolerates Cosopt (dorzolamide-timolol) Pt has a rx for Timolol and uses it for glaucoma   . Ceftriaxone Rash   Past Medical History:  Diagnosis Date  . Anemia    hx of  . Anxiety   . CAD (coronary artery disease)   . Cancer [facility]) 2014   bladder cancer AND RIGHT URETERAL CANCER  .  COPD (chronic obstructive pulmonary disease) (HCC)    CPAP  . Depression   . Diabetes mellitus    TypeII  . Diastolic dysfunction with chronic heart failure ([place])   . Gait difficulty    slow gait"ambulates with walker"  . GERD (gastroesophageal reflux disease)   . 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 H/O Legionnaire's disease 2003  . History of blood clots    R groin  . History of kidney stones   . Hyperkalemia   . Hyperlipemia   . Hypertensive cardiovascular-renal disease   . Hypothyroidism   . Morbid obesity ([place])   . OSA on CPAP    using CPAP although it is hard for him to tolerate  . Osteoarthritis    fingers  . Pancreatitis   . Peripheral neuropathy   . Persistent atrial fibrillation ([place])   . Pneumonia 2003  . Renal insufficiency   . Shortness of breath dyspnea    with exertion  . Sick sinus syndrome ([place]) 06/2010   s/p PPM by [employer]  . Venous insufficiency    Family History  Problem Relation Age of Onset  . Liver cancer Mother         deceased age 40  . Cancer Mother        liver cancer  . Heart attack Father   . Colon cancer Neg Hx    Past Surgical History:  Procedure Laterality Date  . BELPHAROPTOSIS REPAIR Right    Glaucoma  . CARDIAC CATHETERIZATION     11-02-13  . CARDIOVERSION N/A 05/16/2014   Procedure: CARDIOVERSION;  Surgeon: Josue Hector, MD;  [facility] ENDOSCOPY;  Service: Cardiovascular;  Laterality: N/A;  . CARDIOVERSION N/A 11/08/2014   Procedure: CARDIOVERSION;  Surgeon: Fay Records, MD;  Location: [place];  Service: Cardiovascular;  Laterality: N/A;  . CARDIOVERSION N/A 06/22/2015   Procedure: CARDIOVERSION;  Surgeon: Jerline Pain, MD;  Location: [place];  Service: Cardiovascular;  Laterality: N/A;  . CHOLECYSTECTOMY  2012  . CYSTOSCOPY W/ URETERAL STENT PLACEMENT Right 10/26/2013   Procedure: CYSTOSCOPY WITH RETROGRADE PYELOGRAM/URETERAL STENT PLACEMENT;  Surgeon: Alexis Frock, MD;  Location: [facility];  Service: Urology;  Laterality: Right;  . CYSTOSCOPY WITH URETEROSCOPY AND STENT PLACEMENT Right 01/11/2014   Procedure: CYSTOSCOPY WITH URETEROSCOPY ,RIGHT RETROGRADE AND STENT CHANGE AND LASER OF URETERAL TUMOR;  Surgeon: Alexis Frock, MD;  Location: [facility];  Service: Urology;  Laterality: Right;  . CYSTOSCOPY/RETROGRADE/URETEROSCOPY Right 08/23/2014   Procedure: CYSTOSCOPY/RETROGRADE/ DIAGNOSTIC URETEROSCOPY/RIGHT RENAL STONE EXTRACTION;  Surgeon: Alexis Frock, MD;  Location: [facility];  Service: Urology;  Laterality: Right;  . EMBOLECTOMY Left 11/02/2013   Procedure: LEFT FEMORAL EMBOLECTOMY, LEFT FEMORAL ARTERY ENDARTERECTOMY WITH DACRON PATCH ANGIOPLASTY.;  Surgeon: Mal Misty, MD;  Location: [place];  Service: Vascular;  Laterality: Left;  . EYE SURGERY Left    cataract  . GROIN DEBRIDEMENT Right 05/08/2015   Procedure: REMOVAL OF RIGHT GROIN MASS;  Surgeon: Angelia Mould, MD;  Location: [place];  Service: Vascular;  Laterality: Right;  . HOLMIUM LASER APPLICATION Right  07/12/4626   Procedure: HOLMIUM LASER APPLICATION;  Surgeon: Alexis Frock, MD;  Location: [facility];  Service: Urology;  Laterality: Right;  . INSERT / REPLACE / REMOVE PACEMAKER  2011  . NASAL SEPTUM SURGERY  1967  . PACEMAKER PLACEMENT  06/2010   [facility] Accent RF DR, Model [id number] ( Serial number [id number])  . TEE WITHOUT CARDIOVERSION N/A  05/16/2014   Procedure: TRANSESOPHAGEAL ECHOCARDIOGRAM (TEE);  Surgeon: Josue Hector, MD;  Location: [facility] ENDOSCOPY;  Service: Cardiovascular;  Laterality: N/A;  . thromboembolectomy and four compartment fasciotomy Right 2009   leg  . TRANSURETHRAL RESECTION OF BLADDER TUMOR WITH GYRUS (TURBT-GYRUS) N/A 10/26/2013   Procedure: TRANSURETHRAL RESECTION OF BLADDER TUMOR WITH GYRUS (TURBT-GYRUS);  Surgeon: Alexis Frock, MD;  Location: [facility];  Service: Urology;  Laterality: N/A;  . TRANSURETHRAL RESECTION OF BLADDER TUMOR WITH GYRUS (TURBT-GYRUS) N/A 01/11/2014   Procedure: TRANSURETHRAL RESECTION OF BLADDER TUMOR WITH GYRUS (TURBT-GYRUS);  Surgeon: Alexis Frock, MD;  Location: [facility];  Service: Urology;  Laterality: N/A;  . WISDOM TOOTH EXTRACTION     Social History   Socioeconomic History  . Marital status: Married    Spouse name: Not on file  . Number of children: Not on file  . Years of education: Not on file  . Highest education level: Not on file  Occupational History  . Occupation: retired     Fish farm manager: RETIRED    Comment: Scientist, clinical (histocompatibility and immunogenetics)  . Financial resource strain: Not on file  . Food insecurity:    Worry: Not on file    Inability: Not on file  . Transportation needs:    Medical: Not on file    Non-medical: Not on file  Tobacco Use  . Smoking status: Former Smoker    Packs/day: 2.00    Years: 50.00    Pack years: 100.00    Types: Cigarettes    Last attempt to quit: 12/08/2006    Years since quitting: 11.5  . Smokeless tobacco: Never Used  Substance and Sexual Activity  . Alcohol use: No    Alcohol/week: 0.0 oz      Comment: quit 3 years ago  . Drug use: No  . Sexual activity: Not Currently  Lifestyle  . Physical activity:    Days per week: Not on file    Minutes per session: Not on file  . Stress: Not on file  Relationships  . Social connections:    Talks on phone: Not on file    Gets together: Not on file    Attends religious service: Not on file    Active member of club or organization: Not on file    Attends meetings of clubs or organizations: Not on file    Relationship status: Not on file  . Intimate partner violence:    Fear of current or ex partner: Not on file    Emotionally abused: Not on file    Physically abused: Not on file    Forced sexual activity: Not on file  Other Topics Concern  . Not on file  Social History Narrative  . Not on file     Review of Systems: General: negative for chills, fever, night sweats or weight changes.  Cardiovascular: negative for chest pain, dyspnea on exertion, edema, orthopnea, palpitations, paroxysmal nocturnal dyspnea or shortness of breath Dermatological: negative for rash Respiratory: negative for cough or wheezing Urologic: negative for hematuria Abdominal: negative for nausea, vomiting, diarrhea, bright red blood per rectum, melena, or hematemesis Neurologic: negative for visual changes, syncope, or dizziness All other systems reviewed and are otherwise negative except as noted above.   Physical Exam:  Blood pressure (!) 104/54, pulse 71, height 5\' 10"  (1.778 m), weight 298 lb (135.2 kg), SpO2 99 %.  General appearance: alert, cooperative, no distress and morbidly obese Neck: no carotid bruit and no JVD Lungs: clear to auscultation  bilaterally Heart: regular rate and rhythm and 3/6 SM Extremities: venus stasis dermatitis with trace bilateral LEE Pulses: 2+ and symmetric Skin: Skin color, texture, turgor normal. No rashes or lesions Neurologic: Grossly normal  EKG not performed -- personally reviewed   ASSESSMENT AND PLAN:    1. Chronic Systolic HF: volume improved after IV diuretic therapy during hospitalization. D/c weight was 300 lb. Home weights have been stable ranging from 295-298 lb. Denies dyspnea. PO Lasix was reduced from 40 mg BID to once daily due to increase in SCr. We will recheck BMP today to see if renal function has improved.   2. Aortic Stenosis: now severe by recent echo. 41 mmHg. Both Dr. Debara Pickett and Dr. Marlou Porch outlined in their hospital notes that they did not think that he would be a SAVR candidate, but may be a TAVR candidate. It was recommended to refer to valve clinic as outpatient for consideration, once his cellulitis had resolved. He has completed antibiotics. No fever or chills. Will place referral.   3. SSS: PPM followed by Dr. Rayann Heman.   4. PAF: On metoprolol and amiodarone. Eliquis for a/c. HR is controlled.   5. CKD: will repeat BMP to ensure SCr is better with reduction of Lasix.    Will refer to Valve Clinic. He can f/u with general cardiology in ~2-3 months. Typically see's Truitt Merle, NP.   Rosemary Mossbarger Ladoris Gene, MHS [facility] 06/22/2018 11:17 AM

## 2018-06-22 NOTE — Patient Outreach (Signed)
[facility]) Care Management  06/22/2018  [name] [birth year] [id number]   Successful call to [name] today.  Patient has received [facility] packet with patient assistance applications.  He plans to complete applications this afternoon.  He inquires about needing a denial letter from [employer] for a few of the applications and I reviewed with him that he can disregard this as his income is much higher than [employer] requirements.  Patient voiced understanding.    Plan: I will follow-up with [name] in 2-3 weeks if [facility] has not received completed patient assistance applications.   Ralene Bathe, PharmD, [place] [phone]

## 2018-06-23 ENCOUNTER — Other Ambulatory Visit: Payer: Self-pay | Admitting: *Deleted

## 2018-06-23 ENCOUNTER — Telehealth: Payer: Self-pay

## 2018-06-23 ENCOUNTER — Encounter: Payer: Self-pay | Admitting: Physician Assistant

## 2018-06-23 DIAGNOSIS — Z794 Long term (current) use of insulin: Secondary | ICD-10-CM | POA: Diagnosis not present

## 2018-06-23 DIAGNOSIS — R262 Difficulty in walking, not elsewhere classified: Secondary | ICD-10-CM | POA: Diagnosis not present

## 2018-06-23 DIAGNOSIS — L03116 Cellulitis of left lower limb: Secondary | ICD-10-CM | POA: Diagnosis not present

## 2018-06-23 DIAGNOSIS — L03115 Cellulitis of right lower limb: Secondary | ICD-10-CM | POA: Diagnosis not present

## 2018-06-23 DIAGNOSIS — E876 Hypokalemia: Secondary | ICD-10-CM

## 2018-06-23 DIAGNOSIS — J9601 Acute respiratory failure with hypoxia: Secondary | ICD-10-CM | POA: Diagnosis not present

## 2018-06-23 DIAGNOSIS — J449 Chronic obstructive pulmonary disease, unspecified: Secondary | ICD-10-CM | POA: Diagnosis not present

## 2018-06-23 DIAGNOSIS — I5023 Acute on chronic systolic (congestive) heart failure: Secondary | ICD-10-CM | POA: Diagnosis not present

## 2018-06-23 DIAGNOSIS — N183 Chronic kidney disease, stage 3 (moderate): Secondary | ICD-10-CM | POA: Diagnosis not present

## 2018-06-23 DIAGNOSIS — E1122 Type 2 diabetes mellitus with diabetic chronic kidney disease: Secondary | ICD-10-CM | POA: Diagnosis not present

## 2018-06-23 NOTE — Telephone Encounter (Signed)
-----   Message from Consuelo Pandy, [place] sent at 06/22/2018  5:32 PM EDT ----- Kidney function has improved on lower dose of Lasix. Continue 40 mg once daily. His K is mildly low. Encourage he eat potassium rich foods, such as bananas and orange juice. I recommend repeat BMP and CBC in 1 week. Repeat CBC to recheck hgb as this is drifting down a bit. It is difficult for pt to get to the office. He has home health services arranged. See if [facility] RN can draw labs for BMP and CBC. Send results to our office. I also think that he can reschedule his appt with Cecille Rubin next week. We can push visit out for another 2-3 months. Please notify pt of recommendations.

## 2018-06-23 NOTE — Telephone Encounter (Signed)
Ok to give verbal orders for this patient?

## 2018-06-23 NOTE — Telephone Encounter (Signed)
I called & gave [name] verbal orders.

## 2018-06-23 NOTE — Telephone Encounter (Signed)
OK 

## 2018-06-23 NOTE — Telephone Encounter (Signed)
Spoke with pt's home health RN. She is there to review medications. She will review labs and recommendations with pt. They will also repeat BMP and CBC next week. Pt understands scheduling will call to reschedule his appt with Truitt Merle for 2-3 months from now. Pt had no additional questions.

## 2018-06-23 NOTE — Telephone Encounter (Signed)
tracey with [facility] calling for new orders please call her back at [phone]

## 2018-06-23 NOTE — Patient Outreach (Signed)
[name]) Care Management  [place] Initial Home Visit Transition of care day [name] x 1 Joint visit with Home Health RN  06/23/2018  [name] 08/04/1941 [id number]  [name] is a 77 y.o. male referred to [name] for transition of care after recent hospitalization June 22- June 08, 2018 for severe sepsis secondary to lower extremity edema/ cellulitis related to CHF exacerbation; patient had acute respiratory failure with hypoxia during hospitalization. Patient was discharged home to self-care with home health health services in place, as patient refused SNF rehabilitation placement post-[name] discharge. Patient has history including, but not limited to, DM- Type II; Atrial Fibrillation with previously placed pacemaker; CKD- stage III; aortic stenosis; COPD- on CPAP; CAD; HLD; and GERD. HIPAA/ identity verified with patient in person during home visit today; home health RN [name] is also present for home visit, and patient's wife [name], on [name] [name] written consent is also present and actively participates in all aspects of home visit today.  Pleasant 120 minute home visit.  Today, patient reports that he "isdoing all right" after recent hospitalization and he denies pain and [name]/ recent falls post-[name] discharge; patient is in no apparent/ obvious distress throughout entirety of home visit today.  Subjective: "I know I am depressed, but with everything that is going on, I think it's just the way it is.. I am tired of not being able to do anything I used to do."  Assessment:  [name] appears to be recuperating fairly well after his recent hospitalization, although he remains as baseline short of breath with minimal activity; this improves promptly once he rests.  [name] is clearly depressed, and although he declines need for treatment today, he does agree to discuss this with his [name] during upcoming  scheduled appointment.  [name] continues to deny community resource needs.  [name] is knowledgeable about his medications and overall plan of care for self-health management of chronic disease states of CHF and DM, however, he could benefit from ongoing reinforcement of same after his recent hospitalization.  [name] is a high fall risk. Patient further reports:  Medications: -- Has all medicationsand takes as prescribed;denies questions about current medications and is able to accurately verbalize purpose, dosing, and scheduling of his prescribed medications -- independently manages medications, uses two separate pill boxes for morning and evening medications -- denies issues swallowing medications -- currently working with [name] for stated needs around medication assistance-- patient encouraged to maintain communication with [name], and he agrees to do so -- Patient was recently discharged from [name] and all medications were again thoroughly reviewed with patient in his home today-- during medication review, there was one pill noted in his morning pill box, which he/ nor I could identify; patient is not sure what this medication is, and he threw the pill away.  No other discrepancies discovered during home visit today.  Home health [place] [name]) services: --[place] services through [name] for PT, RN, bath aide/ CNA ordered as a result of previous [name] RN CCM care coordination outreach; today, [name] RN is present for visit, and together, we discussed with patient difference between [name] services and [name] services; patient appears to understand and agrees to active participation in [name] and [name] services. -- [place] RN placed call to [name] to obtain orders for una boots bilaterally -- HH PT schedule reviewed with patient by [facility] RN -- [place] RN and [name]  exchanged numbers for ongoing collaboration as indicated  Provider appointments: --Upcoming provider appointments were reviewed with patient  today--patient attended recent scheduled provider appointments last week, and again reported that it was very difficult for him to get to office, as he became "very winded and tired" with walking into office; I again encouraged patient to obtain a wheelchair if necessary from office staff to assist him getting into office; patient stated that his wife would have to help him get up to the office from the lobby, as she usually just "drops" him off, and he gets upstairs "on his own."  Wife appeared willing/ able to assist with this, but patient stated that he does not want her 'to have to do" this, as she has "her own health issues."  Discussed with patient and wife to call provider office staff once they arrive at office, to have staff come down to lobby to assist; patient states he "will think about it," but he does not "want to promise" that he will do this -- wife continues to provide transportation to all provider appointments: she transports patient to front of office door and patient attends office visit alone; staff assist patient out of office back to lobby, where wife picks him up in car; patient does not appear to be willing to change this routine to make it easier for him to get into office for his scheduled appointments -- all upcoming scheduled provider office visits reviewed with patient; patient verbalizes plans to attend all as scheduled; discussed upcoming evaluation for possible [name] due to severe aortic stenosis; patient plans to attend scheduled appointment in August   12:10 pm: Care Coordination outreach x 1:  Patient received incoming phone call from [name] office RN "[name]," who requested to speak to [name] RN CCM:  [name] stated that [name] wants patient to continue taking lasix 40 [name] po QD, as his kidney function lab results had improved; advised that patient should increase his dietary potassium; asked that I review this with patient-- this was completed; reviewed lab work with  patient and discussed with patient and wife foods to incorporate into his diet to obtain potassium.  Relayed to patient that [name] said that the office staff would be contacting them this week to re-schedule next cardiology office provider visit- patient verbalized understanding and agreement with all of above   Safety/ Mobility/ falls:  -- demonstrates slow, steady, purposeful gait around his home using rolling walker; adamantly declines need for wheelchair, states, "it's too much for me and for my wife to manage;" fall risks/ prevention education provided and discussed using teach back method- patient is high fall risk -- no obvious fall risks/ hazards noted in patient's home environment  Self-health management ofchronic disease state of CHF: --previously discussed with patientvalue of/ rationale for daily weight monitoring at home, weight gain guidelines in setting of CHF-- confirmed with patient today that he has received [name] digital high capacity scales and has started monitoring and recording daily weights:  Patient states he just received scales yesterday, and monitoring weights this morning; reports weight this morning at 303 lbs; using teach back method, confirmed that patient is able to verbalize eight gain guidelines in setting of CHF, with appropriate action to call provider for weight gain > 3 lbs overnight/ 5 lbs in one week; discussed with patient importance of recording all daily weights, and of promptly acting on weight gain. -- patient follows heart healthy low salt diet: states he has been following this diet for  years; denies questions around diet; endorses adherence -- patient familiar with CHF zones, and acknowledges that he "stays between the green and yellow zones as his "normal."  Using teach back method, provided and discussed printed EMMI educational material on CHF self-health management, as well as "living better with heart failure" packet-- patient was encouraged to refer  to these materials regularly post-most recent [name] discharge to refresh his stated knowledge of same. 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 -- patient checked blood sugar during home visit at lunch time:  336- states blood sugars "always run in 300's during day time hours."  Patient monitors and records all blood sugars and takes these readings to his [name]/ endocrinology provider appointments with him for each visit -- verbalizes good general understanding of signs/ symptoms/ action plan for hypoglycemia and hyperglycemia, as well as insulin administration  Patient denies further issues, concerns, or problems today, andI confirmed that patient hasmy direct phone number, the main [name] [name] office phone number, and the [name] 24-hour nurse advice phone number should issues arise prior to next scheduled [place] outreachby phone next week.  Discussed with patient that another [place] would  be contacting him next week, patient verbalizes understanding and agreement with this plan.  Objective:    BP (!) 110/56   Pulse 88   Temp 97.7 F (36.5 C)   Resp 20   Wt (!) 303 lb 1.6 oz (137.5 kg)   SpO2 (!) 89%   BMI 43.49 kg/m   Review of Systems  Constitutional: Positive for malaise/fatigue.       Obese ill appearing male  HENT: Positive for hearing loss.        HOH- patient has a hearing aid, but does not use, states "annoys" him  Eyes:       Reports decreased vision in (R) eye due to glaucoma; wears glasses for distance vision, readers for reading  Respiratory: Positive for shortness of breath. Negative for cough and wheezing.        Reports he is at "baseline" with breathing- minimal SOB with activity of ambulation around home; recovers quickly with rest  Cardiovascular: Positive for orthopnea and leg swelling. Negative for chest pain.       +2 mid-calf swelling with erythema bilaterally; ankles/ feet without swelling  Gastrointestinal: Negative for abdominal pain and nausea.  Genitourinary: Positive for frequency and urgency.       Diuretic therapy  Musculoskeletal:  Positive for myalgias. Negative for falls.  Skin:       Bilateral LE erythema knees- ankle; (L) LE has one open sore, oozing yellow serous appearing fluid in minimal amount  Neurological: Negative for dizziness.  Psychiatric/Behavioral: Positive for depression. The patient is not nervous/anxious.        See depression screen- positive; patient declines need for treatment; states he was previously pur on medication for depression "that didn't help at all;" patient agrees to discuss with [name] at upcoming scheduled appointment; denies SI   Physical Exam  Constitutional: He is oriented to person, place, and time. He appears well-developed and well-nourished. No distress.    Cardiovascular: Normal rate, regular rhythm and intact distal pulses.  Murmur heard. Pulses:      Radial pulses are 1+ on the right  side, and 1+ on the left side.  Declines taking shoes off for eval of DP's  Respiratory: Breath sounds normal. No respiratory distress. He has no wheezes. He has no rales.  Exhibits increased WOB with normal activity of ambulation around his home- states this is his "normal"  GI: Soft. Bowel sounds are normal. He exhibits distension.  Lower abdominal distention; patient reports "normal for" him  Musculoskeletal: He exhibits edema.  See ROS  Neurological: He is alert and oriented to person, place, and time.  Skin: Skin is warm and dry. There is erythema.  See ROS  Psychiatric: He has a normal mood and affect. His behavior is normal. Judgment and thought content normal.  Positive depression screen   Encounter Medications:   Outpatient Encounter Medications as of 06/23/2018  Medication Sig Note  . acetaminophen (TYLENOL) 500 [name] tablet Take 1,000 [name] by mouth 2 (two) times daily as needed (pain/headache).    Marland Kitchen albuterol (VENTOLIN HFA) 108 (90 Base) MCG/ACT inhaler Inhale 2 puffs into the lungs every 6 (six) hours as needed for wheezing or shortness of breath. 06/23/2018: Reports uses "a couple of times a week"  . amiodarone (PACERONE) 200 [name] tablet Take 1 tablet (200 [name] total) by mouth daily.   Marland Kitchen apixaban (ELIQUIS) 5 [name] TABS tablet Take 1 tablet (5 [name] total) by mouth 2 (two) times daily. 05/30/2018: Pt takes am and pm, 12 hours apart, no specified times.  . Ascorbic Acid (VITAMIN C) 500 [name] tablet Take 500 [name] by mouth daily.    . brimonidine (ALPHAGAN P) 0.1 % SOLN Place 1 drop into the left eye 3 (three) times daily.    . cholecalciferol (VITAMIN D) 1000 UNITS tablet Take 1,000 Units by mouth daily.    Marland Kitchen CINNAMON PO Take 2,000 [name] by mouth daily.   Marland Kitchen dextromethorphan (DELSYM) 30 [name]/5ML liquid Take 90 [name] by mouth 2 (two) times daily as needed for cough.   . Digestive Enzymes (MULTI-ENZYME) TABS Take 1 tablet by mouth daily.   Marland Kitchen docusate sodium (COLACE) 100 [name] capsule Take 100-200 [name] by mouth daily  as needed for mild constipation.    . dorzolamide (TRUSOPT) 2 % ophthalmic solution Place 1 drop into both eyes 2 (two) times daily.   . fluticasone (FLONASE) 50 MCG/ACT nasal spray Place 2 sprays into both nostrils as needed (congestion).  06/23/2018: Has not needed recently  . furosemide (LASIX) 40 [name] tablet Take 1 tablet (40 [name] total) by mouth daily.   Marland Kitchen guaiFENesin 200 [name] tablet Take 400 [name] by mouth 2 (two) times daily. 400mg  in the morning and 600mg  in the evening    . insulin regular (NOVOLIN R) 100 units/mL  injection 4 times a day (just before each meal), 300-210-210-190 units (Patient taking differently: Inject 150-300 Units into the skin 4 (four) times daily. 4 times a day (just before each meal), 300-200-200-200 units) 06/23/2018: Reports uses 900 U total daily: 300 U q am/ 200 U at lunch/ 200 U in evening/ 200 U q HS- patient adjusts as indicated around blood sugar readings  . Lactobacillus (ACIDOPHILUS) CAPS capsule Take 1 capsule by mouth every evening.    Marland Kitchen levothyroxine (SYNTHROID, LEVOTHROID) 75 MCG tablet Take 1 tablet (75 mcg total) by mouth daily before breakfast.   . metoprolol tartrate (LOPRESSOR) 25 [name] tablet Take 1 tablet (25 [name] total) by mouth 2 (two) times daily.   . Multiple Vitamin (MULTIVITAMIN WITH MINERALS) TABS tablet Take 1 tablet by mouth every evening.    . Omega-3 Fatty Acids (FISH OIL) 1000 [name] CAPS Take 1,000 [name] by mouth daily.    Marland Kitchen omeprazole (PRILOSEC) 40 [name] capsule Take 1 capsule (40 [name] total) by mouth daily.   Marland Kitchen Resveratrol 100 [name] CAPS Take 100 [name] by mouth as needed.    . rosuvastatin (CRESTOR) 40 [name] tablet Take 0.5 tablets (20 [name] total) by mouth every evening.   . Timolol Maleate 0.5 % (DAILY) SOLN Place 1 drop into both eyes 2 (two) times daily.   . traMADol (ULTRAM) 50 [name] tablet TAKE 2 TABLETS BY MOUTH TWICE A DAY AS NEEDED FOR PAIN 06/23/2018: Has not needed recently  . travoprost, benzalkonium, (TRAVATAN) 0.004 % ophthalmic solution Place 1 drop into the left  eye at bedtime.    No facility-administered encounter medications on file as of 06/23/2018.    Functional Status:   In your present state of health, do you have any difficulty performing the following activities: 06/23/2018 06/09/2018  Hearing? [place]? Y -  Difficulty concentrating or making decisions? N N  Walking or climbing stairs? - Y  Dressing or bathing? Y N  Doing errands, shopping? - Y  Conservation officer, nature and eating ? - N  Using the Toilet? - N  In the past six months, have you accidently leaked urine? - N  Do you have problems with loss of bowel control? - N  Managing your Medications? - N  Managing your Finances? - N  Housekeeping or managing your Housekeeping? - Y  Some recent data might be hidden   Fall/Depression Screening:    Fall Risk  06/23/2018 06/18/2018 06/14/2018  Falls in the past year? (No Data) (No Data) (No Data)  Comment Denies [name]/ recent falls post-[name] discharge Patient denies [name]/ recent falls  Denies [name]/ recent falls, post- most recent [name] discharge  Risk for fall due to : Impaired mobility;History of fall(s);Medication side effect - -  Risk for fall due to: Comment fall eval completed; fall risk/ prevention education provided - -   [facility] [name] 2/9 Scores 06/23/2018 02/04/2018 11/08/2015 08/23/2015 07/17/2015  PHQ - 2 Score 4 1 1  0 0  PHQ- 9 Score 7 - - - -   Plan:  Patient will take medications as prescribed and will attend all scheduled provider appointments  Patient will promptly notify care providers for any [name] concerns/ issues/ problems that arise  Patient will activelyengage with/participate in home health services asordered post-[name] discharge  Patient willcontinuemonitoring/ recording daily weightsand will report weight gain > 3 lbs overnight/ 5 lbs in one week to cardiology provider  Patient will continue communicating with [name] Pharmacist regarding stated needs around medication assistance  I will share today's  [facility] CCM initial  home visit notes and care plan with patient's [name]  [name] outreach for transition of care to continue with scheduled phone call next week  [place] [name] Care Plan Problem One     Most Recent Value  Care Plan Problem One  Risk for [name] re-admission related to/ as evidenced by recent hospitalization for sepsis secondary to cellulitis from CHF  Role Documenting the Problem One  Care Management [place] [name] for Problem One  Active  [facility] Long Term Goal   Over the next 31 days, patient will not experience [name] readmission, as evidenced by patient reporting and review of EMR during [name] RN CCM outreach  [place] Term Goal Start Date  06/09/18  Interventions for Problem One Long Term Goal  [place] [name] CM RN initial home visit completed, along with home health RN,  physical assessment , medication review and falls evaluation completed,  reiterated with patient difference between home health and [name] services,  completed care coordination outreach with patient's cardiology provider,  reviewed recent lab values with patient  [facility] [name] Short Term Goal #2   Over the next 30 days, patient will attend all scheduled provider appointments, as evidenced by patient reporting, review of EMR, and collaboration with care providers as indisctaed, during [place] outreach  [name] Short Term Goal #2 Start Date  06/09/18  Interventions for Short Term Goal #2  Reviewed recent and upcoming provider appointments with patient and confirmed that patient has reliable transportation to all provider appointments and plans to attend all as scheduled,  discussed importance of promptly notifying providers for any [name] concerns, issues, weight gain, or problems that arise    [name] Care Plan Problem Two     Most Recent Value  Care Plan Problem Two  Knowledge deficit around self-health management of chronic disease state of CHF, as evidenced by patient reporting  Role Documenting the Problem Two  Care Management  Coordinator  Care Plan for Problem Two  Active  Interventions for Problem Two Long Term Goal   Confirmed that patient received previously ordered scales from [name] and that he has been monitoring and recording weights,  reviewed weight gain guidelines in setting of CHF, and reviewed all recently obtained weights at home,  discussed importance of making weight monitoring/ recording a routine part of his daily care activities  [facility]N Long Term Goal  Over the next 31 days, patient will begin monitoring and recording daily weights, as evidenced by patient reporting and review of same during [place] outreach  [place] Term Goal Start Date  06/09/18  [facility] [name] Short Term Goal #1   Over the next 30 days, patient will be able to verbalize signs/ symptoms and action plan for yellow zone of CHF zone, as evidenced by patient reporting during [name] RN CCM outreach  [facility] [name] Short Term Goal #1 Start Date  06/23/18  Interventions for Short Term Goal #2   Using teach back method, provided and discussed printed EMMI educational material on CHF zones and corresponding action plan, weight gain guidelines in setting of CHF,  provided "Living Well with heart failure" packet and reviewed and discussed with patient and encouraged him to review thoroughly     Oneta Rack, RN, BSN, [place] Coordinator [facility] Care Management  [phone]

## 2018-06-25 DIAGNOSIS — J9601 Acute respiratory failure with hypoxia: Secondary | ICD-10-CM | POA: Diagnosis not present

## 2018-06-25 DIAGNOSIS — E1122 Type 2 diabetes mellitus with diabetic chronic kidney disease: Secondary | ICD-10-CM | POA: Diagnosis not present

## 2018-06-25 DIAGNOSIS — J449 Chronic obstructive pulmonary disease, unspecified: Secondary | ICD-10-CM | POA: Diagnosis not present

## 2018-06-25 DIAGNOSIS — N183 Chronic kidney disease, stage 3 (moderate): Secondary | ICD-10-CM | POA: Diagnosis not present

## 2018-06-25 DIAGNOSIS — L03116 Cellulitis of left lower limb: Secondary | ICD-10-CM | POA: Diagnosis not present

## 2018-06-25 DIAGNOSIS — R262 Difficulty in walking, not elsewhere classified: Secondary | ICD-10-CM | POA: Diagnosis not present

## 2018-06-25 DIAGNOSIS — Z794 Long term (current) use of insulin: Secondary | ICD-10-CM | POA: Diagnosis not present

## 2018-06-25 DIAGNOSIS — L03115 Cellulitis of right lower limb: Secondary | ICD-10-CM | POA: Diagnosis not present

## 2018-06-25 DIAGNOSIS — I5023 Acute on chronic systolic (congestive) heart failure: Secondary | ICD-10-CM | POA: Diagnosis not present

## 2018-06-28 DIAGNOSIS — L03115 Cellulitis of right lower limb: Secondary | ICD-10-CM | POA: Diagnosis not present

## 2018-06-28 DIAGNOSIS — L03116 Cellulitis of left lower limb: Secondary | ICD-10-CM | POA: Diagnosis not present

## 2018-06-28 DIAGNOSIS — J449 Chronic obstructive pulmonary disease, unspecified: Secondary | ICD-10-CM | POA: Diagnosis not present

## 2018-06-28 DIAGNOSIS — N183 Chronic kidney disease, stage 3 (moderate): Secondary | ICD-10-CM | POA: Diagnosis not present

## 2018-06-28 DIAGNOSIS — J9601 Acute respiratory failure with hypoxia: Secondary | ICD-10-CM | POA: Diagnosis not present

## 2018-06-28 DIAGNOSIS — Z794 Long term (current) use of insulin: Secondary | ICD-10-CM | POA: Diagnosis not present

## 2018-06-28 DIAGNOSIS — R262 Difficulty in walking, not elsewhere classified: Secondary | ICD-10-CM | POA: Diagnosis not present

## 2018-06-28 DIAGNOSIS — I5023 Acute on chronic systolic (congestive) heart failure: Secondary | ICD-10-CM | POA: Diagnosis not present

## 2018-06-28 DIAGNOSIS — E1122 Type 2 diabetes mellitus with diabetic chronic kidney disease: Secondary | ICD-10-CM | POA: Diagnosis not present

## 2018-06-29 ENCOUNTER — Ambulatory Visit: Payer: Medicare HMO | Admitting: Nurse Practitioner

## 2018-06-30 DIAGNOSIS — R262 Difficulty in walking, not elsewhere classified: Secondary | ICD-10-CM | POA: Diagnosis not present

## 2018-06-30 DIAGNOSIS — L03115 Cellulitis of right lower limb: Secondary | ICD-10-CM | POA: Diagnosis not present

## 2018-06-30 DIAGNOSIS — I5023 Acute on chronic systolic (congestive) heart failure: Secondary | ICD-10-CM | POA: Diagnosis not present

## 2018-06-30 DIAGNOSIS — Z794 Long term (current) use of insulin: Secondary | ICD-10-CM | POA: Diagnosis not present

## 2018-06-30 DIAGNOSIS — N183 Chronic kidney disease, stage 3 (moderate): Secondary | ICD-10-CM | POA: Diagnosis not present

## 2018-06-30 DIAGNOSIS — L03116 Cellulitis of left lower limb: Secondary | ICD-10-CM | POA: Diagnosis not present

## 2018-06-30 DIAGNOSIS — E1122 Type 2 diabetes mellitus with diabetic chronic kidney disease: Secondary | ICD-10-CM | POA: Diagnosis not present

## 2018-06-30 DIAGNOSIS — J9601 Acute respiratory failure with hypoxia: Secondary | ICD-10-CM | POA: Diagnosis not present

## 2018-06-30 DIAGNOSIS — J449 Chronic obstructive pulmonary disease, unspecified: Secondary | ICD-10-CM | POA: Diagnosis not present

## 2018-06-30 NOTE — Addendum Note (Signed)
Addended by: Dollene Primrose on: 06/30/2018 02:34 PM   Modules accepted: Orders

## 2018-07-01 ENCOUNTER — Ambulatory Visit (INDEPENDENT_AMBULATORY_CARE_PROVIDER_SITE_OTHER): Payer: Medicare HMO | Admitting: Endocrinology

## 2018-07-01 ENCOUNTER — Encounter: Payer: Self-pay | Admitting: Endocrinology

## 2018-07-01 ENCOUNTER — Other Ambulatory Visit: Payer: Self-pay | Admitting: *Deleted

## 2018-07-01 VITALS — BP 118/82 | HR 74 | Ht 70.0 in | Wt 310.4 lb

## 2018-07-01 DIAGNOSIS — E1169 Type 2 diabetes mellitus with other specified complication: Secondary | ICD-10-CM

## 2018-07-01 DIAGNOSIS — I5023 Acute on chronic systolic (congestive) heart failure: Secondary | ICD-10-CM

## 2018-07-01 DIAGNOSIS — E669 Obesity, unspecified: Secondary | ICD-10-CM | POA: Diagnosis not present

## 2018-07-01 LAB — POCT GLYCOSYLATED HEMOGLOBIN (HGB A1C): HEMOGLOBIN A1C: 8 % — AB (ref 4.0–5.6)

## 2018-07-01 NOTE — Patient Instructions (Addendum)
blood tests are requested for you today.  We'll let you know about the results. Please continue the same insulin for now.  You will get better much faster if you elevate your legs above the rest of your body. check your blood sugar twice a day.  vary the time of day when you check, between before the 3 meals, and at bedtime.  also check if you have symptoms of your blood sugar being too high or too low.  please keep a record of the readings and bring it to your next appointment here (or you can bring the meter itself).  You can write it on any piece of paper.  please call us sooner if your blood sugar goes below 70, or if you have a lot of readings over 200. Please come back for a follow-up appointment in 1 month.

## 2018-07-01 NOTE — Progress Notes (Signed)
Subjective:    Patient ID: [name], male    DOB: 20-May-1941, 77 y.o.   MRN: [id number]  HPI Pt returns for f/u of diabetes mellitus:  DM type: Insulin-requiring type 2.  Dx'ed: 1988.  Complications: renal insufficiency, polyneuropathy, and CAD.   Therapy: insulin since 1989.  DKA: never.   Severe hypoglycemia: never.   Pancreatitis: several episodes, most recently in 2013.   Other: he takes multiple daily injections.  He takes U-100 reg, rather than U-500, due to cost.   He brings a record of his cbg's which I have reviewed today.  It varies from 44-440.  It is still in general lowest at HS, but he has cbg's below 100 at all times of day.  pt states he feels well in general.   Pt says his leg redness is improving.   Past Medical History:  Diagnosis Date  . Bladder cancer ([place]) 2014  . COPD (chronic obstructive pulmonary disease) (HCC)    CPAP  . Depression   . Diabetes mellitus    TypeII  . Diastolic dysfunction with chronic heart failure ([place])   . GERD (gastroesophageal reflux disease)   . H/O Legionnaire's disease 2003  . Hyperlipemia   . Hypothyroidism   . Morbid obesity ([place])   . OSA on CPAP   . Osteoarthritis    fingers  . Pancreatitis   . Peripheral neuropathy   . Persistent atrial fibrillation ([place])   . Renal insufficiency   . Sick sinus syndrome ([place]) 06/2010   s/p PPM by [employer]  . Venous insufficiency     Past Surgical History:  Procedure Laterality Date  . BELPHAROPTOSIS REPAIR Right    Glaucoma  . CARDIAC CATHETERIZATION     11-02-13  . CARDIOVERSION N/A 05/16/2014   Procedure: CARDIOVERSION;  Surgeon: Josue Hector, MD;  Location: [facility] ENDOSCOPY;  Service: Cardiovascular;  Laterality: N/A;  . CARDIOVERSION N/A 11/08/2014   Procedure: CARDIOVERSION;  Surgeon: Fay Records, MD;  Location: [place];  Service: Cardiovascular;  Laterality: N/A;  . CARDIOVERSION N/A 06/22/2015   Procedure: CARDIOVERSION;  Surgeon: Jerline Pain, MD;  Location: [place];  Service: Cardiovascular;  Laterality: N/A;  . CHOLECYSTECTOMY  2012  . CYSTOSCOPY W/ URETERAL STENT PLACEMENT Right 10/26/2013   Procedure: CYSTOSCOPY WITH RETROGRADE PYELOGRAM/URETERAL STENT PLACEMENT;  Surgeon: Alexis Frock, MD;  Location: [facility];  Service: Urology;  Laterality: Right;  . CYSTOSCOPY WITH URETEROSCOPY AND STENT PLACEMENT Right 01/11/2014   Procedure: CYSTOSCOPY WITH URETEROSCOPY ,RIGHT RETROGRADE AND STENT CHANGE AND LASER OF URETERAL TUMOR;  Surgeon: Alexis Frock, MD;  Location: [facility];  Service: Urology;  Laterality: Right;  . CYSTOSCOPY/RETROGRADE/URETEROSCOPY Right 08/23/2014   Procedure: CYSTOSCOPY/RETROGRADE/ DIAGNOSTIC URETEROSCOPY/RIGHT RENAL STONE EXTRACTION;  Surgeon: Alexis Frock, MD;  Location[facility];  Service: Urology;  Laterality: Right;  . EMBOLECTOMY Left 11/02/2013   Procedure: LEFT FEMORAL EMBOLECTOMY, LEFT FEMORAL ARTERY ENDARTERECTOMY WITH DACRON PATCH ANGIOPLASTY.;  Surgeon: Mal Misty, MD;  Location: [place];  Service: Vascular;  Laterality: Left;  . EYE SURGERY Left    cataract  . GROIN DEBRIDEMENT Right 05/08/2015   Procedure: REMOVAL OF RIGHT GROIN MASS;  Surgeon: Angelia Mould, MD;  Location: [place];  Service: Vascular;  Laterality: Right;  . HOLMIUM LASER APPLICATION Right 7/0/2637   Procedure: HOLMIUM LASER APPLICATION;  Surgeon: Alexis Frock, MD;  [facility];  Service: Urology;  Laterality: Right;  . INSERT / REPLACE / REMOVE PACEMAKER  2011  . NASAL  [place]  . PACEMAKER PLACEMENT  06/2010   [facility] Accent RF DR, Model [id number] ( Serial number [id number])  . TEE WITHOUT CARDIOVERSION N/A 05/16/2014   Procedure: TRANSESOPHAGEAL ECHOCARDIOGRAM (TEE);  Surgeon: Josue Hector, MD;  Location: [facility] ENDOSCOPY;  Service: Cardiovascular;  Laterality: N/A;  . thromboembolectomy and four compartment fasciotomy Right 2009   leg  . TRANSURETHRAL RESECTION OF BLADDER TUMOR WITH GYRUS (TURBT-GYRUS) N/A 10/26/2013    Procedure: TRANSURETHRAL RESECTION OF BLADDER TUMOR WITH GYRUS (TURBT-GYRUS);  Surgeon: Alexis Frock, MD;  [facility];  Service: Urology;  Laterality: N/A;  . TRANSURETHRAL RESECTION OF BLADDER TUMOR WITH GYRUS (TURBT-GYRUS) N/A 01/11/2014   Procedure: TRANSURETHRAL RESECTION OF BLADDER TUMOR WITH GYRUS (TURBT-GYRUS);  Surgeon: Alexis Frock, MD;  [facility];  Service: Urology;  Laterality: N/A;  . WISDOM TOOTH EXTRACTION      Social History   Socioeconomic History  . Marital status: Married    Spouse name: Not on file  . Number of children: Not on file  . Years of education: Not on file  . Highest education level: Not on file  Occupational History  . Occupation: retired     Fish farm manager: RETIRED    Comment: Scientist, clinical (histocompatibility and immunogenetics)  . Financial resource strain: Not on file  . Food insecurity:    Worry: Not on file    Inability: Not on file  . Transportation needs:    Medical: Not on file    Non-medical: Not on file  Tobacco Use  . Smoking status: Former Smoker    Packs/day: 2.00    Years: 50.00    Pack years: 100.00    Types: Cigarettes    Last attempt to quit: 12/08/2006    Years since quitting: 11.5  . Smokeless tobacco: Never Used  Substance and Sexual Activity  . Alcohol use: No    Alcohol/week: 0.0 oz    Comment: quit 3 years ago  . Drug use: No  . Sexual activity: Not Currently  Lifestyle  . Physical activity:    Days per week: Not on file    Minutes per session: Not on file  . Stress: Not on file  Relationships  . Social connections:    Talks on phone: Not on file    Gets together: Not on file    Attends religious service: Not on file    Active member of club or organization: Not on file    Attends meetings of clubs or organizations: Not on file    Relationship status: Not on file  . Intimate partner violence:    Fear of current or ex partner: Not on file    Emotionally abused: Not on file    Physically abused: Not on file    Forced  sexual activity: Not on file  Other Topics Concern  . Not on file  Social History Narrative  . Not on file    Current Outpatient Medications on File Prior to Visit  Medication Sig Dispense Refill  . acetaminophen (TYLENOL) 500 MG tablet Take 1,000 mg by mouth 2 (two) times daily as needed (pain/headache).     Marland Kitchen albuterol (VENTOLIN HFA) 108 (90 Base) MCG/ACT inhaler Inhale 2 puffs into the lungs every 6 (six) hours as needed for wheezing or shortness of breath. 18 Inhaler 1  . amiodarone (PACERONE) 200 MG tablet Take 1 tablet (200 mg total) by mouth daily. 90 tablet 3  . apixaban (ELIQUIS) 5 MG TABS tablet Take 1  tablet (5 mg total) by mouth 2 (two) times daily. 180 tablet 3  . Ascorbic Acid (VITAMIN C) 500 MG tablet Take 500 mg by mouth daily.     . brimonidine (ALPHAGAN P) 0.1 % SOLN Place 1 drop into the left eye 3 (three) times daily.     . cholecalciferol (VITAMIN D) 1000 UNITS tablet Take 1,000 Units by mouth daily.     Marland Kitchen CINNAMON PO Take 2,000 mg by mouth daily.    Marland Kitchen dextromethorphan (DELSYM) 30 MG/5ML liquid Take 90 mg by mouth 2 (two) times daily as needed for cough.    . Digestive Enzymes (MULTI-ENZYME) TABS Take 1 tablet by mouth daily.    Marland Kitchen docusate sodium (COLACE) 100 MG capsule Take 100-200 mg by mouth daily as needed for mild constipation.     . dorzolamide (TRUSOPT) 2 % ophthalmic solution Place 1 drop into both eyes 2 (two) times daily.    . fluticasone (FLONASE) 50 MCG/ACT nasal spray Place 2 sprays into both nostrils as needed (congestion).     . furosemide (LASIX) 40 MG tablet Take 1 tablet (40 mg total) by mouth daily. 180 tablet 3  . guaiFENesin 200 MG tablet Take 400 mg by mouth 2 (two) times daily. 400mg  in the morning and 600mg  in the evening     . insulin regular (NOVOLIN R) 100 units/mL injection 4 times a day (just before each meal), 300-210-210-190 units (Patient taking differently: Inject 150-300 Units into the skin 4 (four) times daily. 4 times a day (just before  each meal), 300-200-200-200 units) 28 vial 3  . Lactobacillus (ACIDOPHILUS) CAPS capsule Take 1 capsule by mouth every evening.     Marland Kitchen levothyroxine (SYNTHROID, LEVOTHROID) 75 MCG tablet Take 1 tablet (75 mcg total) by mouth daily before breakfast. 90 tablet 3  . metoprolol tartrate (LOPRESSOR) 25 MG tablet Take 1 tablet (25 mg total) by mouth 2 (two) times daily. 180 tablet 3  . Multiple Vitamin (MULTIVITAMIN WITH MINERALS) TABS tablet Take 1 tablet by mouth every evening.     . Omega-3 Fatty Acids (FISH OIL) 1000 MG CAPS Take 1,000 mg by mouth daily.     Marland Kitchen omeprazole (PRILOSEC) 40 MG capsule Take 1 capsule (40 mg total) by mouth daily. 90 capsule 3  . Resveratrol 100 MG CAPS Take 100 mg by mouth as needed.     . rosuvastatin (CRESTOR) 40 MG tablet Take 0.5 tablets (20 mg total) by mouth every evening. 45 tablet 3  . Timolol Maleate 0.5 % (DAILY) SOLN Place 1 drop into both eyes 2 (two) times daily.    . traMADol (ULTRAM) 50 MG tablet TAKE 2 TABLETS BY MOUTH TWICE A DAY AS NEEDED FOR PAIN 60 tablet 2  . travoprost, benzalkonium, (TRAVATAN) 0.004 % ophthalmic solution Place 1 drop into the left eye at bedtime.     No current facility-administered medications on file prior to visit.     Allergies  Allergen Reactions  . Actos [Pioglitazone] Swelling  . Timolol Other (See Comments)    Slow heart rate but tolerates Cosopt (dorzolamide-timolol) Pt has a rx for Timolol and uses it for glaucoma   . Ceftriaxone Rash    Family History  Problem Relation Age of Onset  . Liver cancer Mother        deceased age 74  . Cancer Mother        liver cancer  . Heart attack Father   . Colon cancer Neg Hx     BP  118/82 (BP Location: Right Arm, Patient Position: Sitting, Cuff Size: Normal)   Pulse 74   Ht 5\' 10"  (1.778 m)   Wt (!) 310 lb 6.4 oz (140.8 kg)   SpO2 98%   BMI 44.54 kg/m   Review of Systems Denies fever.  No change in chronic sob.  He has gained weight since lasix was reduced.  He  reports depression due to illnesses, but no SI.      Objective:   Physical Exam VITAL SIGNS:  See vs page GENERAL: no distress Pulses: dorsalis pedis absent bilat (prob due to edema).  MSK: no deformity of the feet or ankles.   CV: 2+ bilat edema of the legs.   Skin:  No leg ulcer, but the skin is dry.  normal color and temp on the feet and ankles. Several healed surgical scars at the right leg.  Mild erythema of both legs Neuro: sensation is intact to touch on the feet and ankles, but severely decreased from normal Ext: There is severe bilateral onychomycosis of the toenails.    Lab Results  Component Value Date   HGBA1C 8.0 (A) 07/01/2018   Lab Results  Component Value Date   CREATININE 1.62 (H) 06/22/2018   BUN 25 06/22/2018   NA 138 06/22/2018   K 3.4 (L) 06/22/2018   CL 104 06/22/2018   CO2 18 (L) 06/22/2018   Lab Results  Component Value Date   TSH 3.724 06/04/2018       Assessment & Plan:  Depression: new: we discussed: he declines ref psych and medication.    Cellulitis: improved: Please continue the same medication.  Insulin-requiring type 2 DM, with renal insuff: therapy limited by variable cbg's.   Patient Instructions  blood tests are requested for you today.  We'll let you know about the results. Please continue the same insulin for now.  You will get better much faster if you elevate your legs above the rest of your body. check your blood sugar twice a day.  vary the time of day when you check, between before the 3 meals, and at bedtime.  also check if you have symptoms of your blood sugar being too high or too low.  please keep a record of the readings and bring it to your next appointment here (or you can bring the meter itself).  You can write it on any piece of paper.  please call us sooner if your blood sugar goes below 70, or if you have a lot of readings over 200. Please come back for a follow-up appointment in 1 month.

## 2018-07-01 NOTE — Patient Outreach (Signed)
[place] [facility]) Care Management  07/01/2018  [name] 12/01/1941 [id number]   Covering for assigned care manager L. Tousey. Weekly transition of care call placed to member.  He report he does not have much time to talk as he is preparing for his appointment with primary MD.  [name] he has been weighing himself, concerned about weight gain.  Last week during home visit with [facility], weight was 303 pounds, today 310 pounds. State he will discuss weight gain today with MD.    He does confirm that home health nurses has started placing una boots.  Also confirm medication & diet compliance as well as increasing oral potassium intake, stating he eat a banana per day.    He denies any urgent concerns, denies shortness of breath.  Will provide update to assigned care manager to follow up next week.   THN CM Care Plan Problem One     Most Recent Value  Care Plan Problem One  Risk for hospital re-admission related to/ as evidenced by recent hospitalization for sepsis secondary to cellulitis from CHF  Role Documenting the Problem One  Care Management Coordinator  Care Plan for Problem One  Active  THN Long Term Goal   Over the next 31 days, patient will not experience hospital readmission, as evidenced by patient reporting and review of EMR during [facility] RN CCM outreach  [place] Term Goal Start Date  06/09/18  Interventions for Problem One Long Term Goal  Discussed with member the importance of following plan of care in effort to manage chronic health conditions and decrease risk of readmission  THN CM Short Term Goal #2   Over the next 30 days, patient will attend all scheduled provider appointments, as evidenced by patient reporting, review of EMR, and collaboration with care providers as indisctaed, during [place] outreach  [facility] CM Short Term Goal #2 Start Date  06/09/18  [facility] CM Short Term Goal #2 Met Date  07/01/18    [facility] CM Care Plan Problem Two     Most Recent Value  Care Plan  Problem Two  Knowledge deficit around self-health management of chronic disease state of CHF, as evidenced by patient reporting  Role Documenting the Problem Two  Care Management Coordinator  Care Plan for Problem Two  Active  THN Long Term Goal  Over the next 31 days, patient will begin monitoring and recording daily weights, as evidenced by patient reporting and review of same during [place] outreach  [place] Term Goal Start Date  06/09/18  [facility] Long Term Goal Met Date  07/01/18  THN CM Short Term Goal #1   Over the next 30 days, patient will be able to verbalize signs/ symptoms and action plan for yellow zone of CHF zone, as evidenced by patient reporting during [name] RN CCM outreach  [facility] CM Short Term Goal #1 Start Date  06/23/18  Interventions for Short Term Goal #2   Reviewed heart failure zones with member, advised of when to contact primary MD      [name], RN, MSN [place] Manager [phone]

## 2018-07-02 DIAGNOSIS — I5023 Acute on chronic systolic (congestive) heart failure: Secondary | ICD-10-CM | POA: Diagnosis not present

## 2018-07-02 DIAGNOSIS — N183 Chronic kidney disease, stage 3 (moderate): Secondary | ICD-10-CM | POA: Diagnosis not present

## 2018-07-02 DIAGNOSIS — R262 Difficulty in walking, not elsewhere classified: Secondary | ICD-10-CM | POA: Diagnosis not present

## 2018-07-02 DIAGNOSIS — E1122 Type 2 diabetes mellitus with diabetic chronic kidney disease: Secondary | ICD-10-CM | POA: Diagnosis not present

## 2018-07-02 DIAGNOSIS — L03115 Cellulitis of right lower limb: Secondary | ICD-10-CM | POA: Diagnosis not present

## 2018-07-02 DIAGNOSIS — J9601 Acute respiratory failure with hypoxia: Secondary | ICD-10-CM | POA: Diagnosis not present

## 2018-07-02 DIAGNOSIS — Z794 Long term (current) use of insulin: Secondary | ICD-10-CM | POA: Diagnosis not present

## 2018-07-02 DIAGNOSIS — J449 Chronic obstructive pulmonary disease, unspecified: Secondary | ICD-10-CM | POA: Diagnosis not present

## 2018-07-02 DIAGNOSIS — L03116 Cellulitis of left lower limb: Secondary | ICD-10-CM | POA: Diagnosis not present

## 2018-07-03 DIAGNOSIS — R262 Difficulty in walking, not elsewhere classified: Secondary | ICD-10-CM | POA: Diagnosis not present

## 2018-07-03 DIAGNOSIS — N183 Chronic kidney disease, stage 3 (moderate): Secondary | ICD-10-CM | POA: Diagnosis not present

## 2018-07-03 DIAGNOSIS — I480 Paroxysmal atrial fibrillation: Secondary | ICD-10-CM | POA: Diagnosis not present

## 2018-07-03 DIAGNOSIS — J449 Chronic obstructive pulmonary disease, unspecified: Secondary | ICD-10-CM | POA: Diagnosis not present

## 2018-07-03 DIAGNOSIS — J9601 Acute respiratory failure with hypoxia: Secondary | ICD-10-CM | POA: Diagnosis not present

## 2018-07-03 DIAGNOSIS — L03116 Cellulitis of left lower limb: Secondary | ICD-10-CM | POA: Diagnosis not present

## 2018-07-03 DIAGNOSIS — I5023 Acute on chronic systolic (congestive) heart failure: Secondary | ICD-10-CM | POA: Diagnosis not present

## 2018-07-03 DIAGNOSIS — E1122 Type 2 diabetes mellitus with diabetic chronic kidney disease: Secondary | ICD-10-CM | POA: Diagnosis not present

## 2018-07-03 DIAGNOSIS — Z794 Long term (current) use of insulin: Secondary | ICD-10-CM | POA: Diagnosis not present

## 2018-07-03 DIAGNOSIS — L03115 Cellulitis of right lower limb: Secondary | ICD-10-CM | POA: Diagnosis not present

## 2018-07-05 ENCOUNTER — Telehealth: Payer: Self-pay | Admitting: Cardiology

## 2018-07-05 ENCOUNTER — Other Ambulatory Visit: Payer: Self-pay | Admitting: *Deleted

## 2018-07-05 ENCOUNTER — Encounter: Payer: Self-pay | Admitting: *Deleted

## 2018-07-05 NOTE — Telephone Encounter (Signed)
Spoke to patient who recently gained 9 lbs over 2 weeks.  He recently had his Lasix decreased from 80 mg to 40 mg daily.  He is inquiring about switching back to the 80 mg daily.  Please advise, thank you.

## 2018-07-05 NOTE — Patient Outreach (Signed)
[place] [facility]) Care Management [place] Telephone Outreach, Transition of Care day 27  07/05/2018  [name] Sep 13, 1941 [id number]  Successful telephone outreach to [name], 77 y.o. male referred to [place] for transition of care after recent hospitalization June 22- June 08, 2018 for severe sepsis secondary to lower extremity edema/ cellulitis related to CHF exacerbation; patient had acute respiratory failure with hypoxia during hospitalization. Patient was discharged home to self-care with home health health services in place, as patient refused SNF rehabilitation placement post-hospital discharge. Patient has history including, but not limited to, DM- Type II; Atrial Fibrillation with previously placed pacemaker; CKD- stage III; aortic stenosis; COPD- on CPAP; CAD; HLD; and GERD. HIPAA/ identity verified with patient during phone call today.  Today, patient reports that he 'is not having a very good day;" and he states several times that he "just doesn't feel good."  After much encouragement, patient is able to verbalize that he 'just feels more tired and weak, and a little more short of breath than usual."  Patient reports that he is not in pain and has had experienced no new or recent falls; continues using assistive devices for ambulation.  Patient sounds to be in no apparent/ obvious distress throughout phone call today.  Patient further reports:  -- has all medicationsand takes as prescribed;denies questions/ concerns about current medicationsand states "no changes" were made as a result of his recent PCP office visit last week -- home health services remain in place; patient continues actively participating; states "going fine;" although he adds that home health staff have been unable to obtain blood for lab work from him; states he expects another home health visit "soon." -- attended PCP office visit last week on 07/01/18; reports that he told  PCP about recent weight gain, but that he was advised to report to his cardiologist-- patient reports today that he has not yet contacted cardiology provider to report weight gain, as advised by both [facility] RN CCM and PCP last week.   -- reviewed recently recorded weights at home over last week:  Patient has experienced 6-8 lb weight gain since [place] CM initial home visit on 06/23/18 Weight on 06/23/18: 303 lbs; weight reported today: 309 lbs; weight ranges over weekend: "309-310 lbs" -- reiterated with patient previously provided education around weight gain guidelines in setting of CHF and action plans for both yellow and red zones:  Patient verbalizes understanding, and does not have an explanation as to why he has not yet contacted his cardiologist as advised last week for ongoing weight gain:  Patient was again encouraged to notify cardiologist for ongoing reported weight gain, along with associated symptoms, also reported today.  I reminded patient that weight gain is the earliest indicator that he may be retaining fluid, and reiterated basic rationale behind the weight gain guidelines.  Patient declines assistance in making call to cardiologist office-- and I encouraged him to notify me if he has any difficulty contacting cardiology provider to report weight gain. Patient agrees to place call to cardiology provider as soon as our phone conversation is ended this afternoon.  Patient denies further issues, concerns, or problems today, andI confirmed that patient hasmy direct phone number, the main [place] office phone number, and the [facility] CM 24-hour nurse advice phone number should issues arise prior to next scheduled [place] outreachby phone next week.    Plan:  Patient will take medications as prescribed and will  attend all scheduled provider appointments  Patient will promptly notify care providers for any new concerns/ issues/ problems that arise, and will contact his cardiology  provider today to report ongoing weight gain over last week  Patient will activelyengage with/participate in home health servicesasordered post-hospital discharge  Patient willcontinuemonitoring/ recording daily weightsand will report weight gain > 3 lbs overnight/ 5 lbs in one week to cardiology provider  Patient will continue communicating with [facility] Pharmacist regarding stated needs around medication assistance  [facility] Community CM outreachfor transition of careto continue with scheduledphone call nextweek  [facility] CM Care Plan Problem One     Most Recent Value  Care Plan Problem One  Risk for hospital re-admission related to/ as evidenced by recent hospitalization for sepsis secondary to cellulitis from CHF  Role Documenting the Problem One  Care Management Coordinator  Care Plan for Problem One  Active  THN Long Term Goal   Over the next 31 days, patient will not experience hospital readmission, as evidenced by patient reporting and review of EMR during [facility] RN CCM outreach  [place] Term Goal Start Date  06/09/18  Interventions for Problem One Long Term Goal  Discussed with patient his current clinical condition, recent and upcoming provider appointments,  recent home health visits    [facility] CM Care Plan Problem Two     Most Recent Value  Care Plan Problem Two  Knowledge deficit around self-health management of chronic disease state of CHF, as evidenced by patient reporting  Role Documenting the Problem Two  Care Management Coordinator  Care Plan for Problem Two  Active  THN CM Short Term Goal #1   Over the next 30 days, patient will be able to verbalize signs/ symptoms and action plan for yellow zone of CHF zone, as evidenced by patient reporting during [facility] RN CCM outreach  [facility] CM Short Term Goal #1 Start Date  06/23/18  Interventions for Short Term Goal #2   Discussed with patient his recent weight gain and currently reported symptoms: reiterated action plan for both weight gain  and yellow-- red CHF zones,  encouraged patient to promptly notify cardiologist around weight gain reported today     Oneta Rack, RN, BSN, [place] Care Management  [phone]

## 2018-07-05 NOTE — Telephone Encounter (Signed)
Return lasix to previous dosing of 80mg . Follow-up with Cecille Rubin at next available time.

## 2018-07-05 NOTE — Telephone Encounter (Signed)
New Message   Pt c/o medication issue:  1. Name of Medication: furosemide (LASIX) 40 MG tablet  2. How are you currently taking this medication (dosage and times per day)? Take 1 tablet (40 mg total) by mouth daily  3. Are you having a reaction (difficulty breathing--STAT)? yes  4. What is your medication issue? Pt states that simmons decreased his dosage fom 80-40 , says his weight is gradually increasing gradually and wants to know if he should continude

## 2018-07-06 ENCOUNTER — Other Ambulatory Visit: Payer: Self-pay | Admitting: Pharmacist

## 2018-07-06 LAB — CBC AND DIFFERENTIAL
HCT: 34 — AB (ref 41–53)
Hemoglobin: 9.8 — AB (ref 13.5–17.5)

## 2018-07-06 MED ORDER — FUROSEMIDE 40 MG PO TABS: 40.0000 mg | ORAL_TABLET | Freq: Two times a day (BID) | ORAL | 3 refills | Status: DC

## 2018-07-06 NOTE — Telephone Encounter (Signed)
Late entry:  Spoke with Pt 07/05/2018 at 6:30 pm after speaking with Dr. Rayann Heman.  Per Dr. Rayann Heman, have Pt return to his previous lasix dose of 40 mg BID.  Pt indicates understanding.  States he has not felt well.  Advised to take 2nd dose of lasix at this time and then return to his normal times tomorrow 07/06/2018.  Advised Pt Dr. Rayann Heman wants him to see Cecille Rubin or him within next 2 weeks.  Pt indicates understanding.  Advised Pt this nurse would call him with next appt.

## 2018-07-06 NOTE — Telephone Encounter (Signed)
S/w pt is aware an appt was made today for appt with Truitt Merle, NP, tomorrow, pt stated does not know if pt can make appt due to difficulty getting up to office from parking lot, gave pt direct line, pt will call when arrives will meet pt in parking lot with wheelchair.

## 2018-07-06 NOTE — Patient Outreach (Signed)
[facility] [facility]) Care Management  07/06/2018  JAHMIRE RUFFINS [birth year] [id number]   Successful call to Mr. Tymothy Cass regarding medication assistance.  Patient reports he has requested from [facility] his most recent EOB printout with all out-of-pocket expenditure costs through June.  The last EOB received by patient is from April and he does not believe this will show he has spent enough TROOP for Apixaban and Novolog.  I recommended that he return applications with EOB from April so applications be start being processed and can be updated later with new EOB however patient is adament that he wait until he receives new EOB to return applications.  I   We reviewed that his eye drop applications (Alphagan and Travatan) do not require TROOP therefore patient can return these applications earlier without TROOP.  Patient reports he will look into these applications and may send these separately back to [facility].   Plan: I will route note to [facility] pharmacy technician Etter Sjogren to follow-up with patient regarding return of applications.   Ralene Bathe, PharmD, [place] [phone]

## 2018-07-07 ENCOUNTER — Telehealth: Payer: Self-pay | Admitting: *Deleted

## 2018-07-07 ENCOUNTER — Telehealth: Payer: Self-pay | Admitting: Endocrinology

## 2018-07-07 ENCOUNTER — Ambulatory Visit: Payer: Medicare HMO | Admitting: Nurse Practitioner

## 2018-07-07 ENCOUNTER — Encounter: Payer: Self-pay | Admitting: Nurse Practitioner

## 2018-07-07 ENCOUNTER — Other Ambulatory Visit: Payer: Self-pay | Admitting: *Deleted

## 2018-07-07 VITALS — BP 100/58 | HR 74 | Ht 70.0 in | Wt 311.1 lb

## 2018-07-07 DIAGNOSIS — I5023 Acute on chronic systolic (congestive) heart failure: Secondary | ICD-10-CM

## 2018-07-07 DIAGNOSIS — I480 Paroxysmal atrial fibrillation: Secondary | ICD-10-CM | POA: Diagnosis not present

## 2018-07-07 DIAGNOSIS — I5043 Acute on chronic combined systolic (congestive) and diastolic (congestive) heart failure: Secondary | ICD-10-CM

## 2018-07-07 DIAGNOSIS — Z6841 Body Mass Index (BMI) 40.0 and over, adult: Secondary | ICD-10-CM | POA: Diagnosis not present

## 2018-07-07 DIAGNOSIS — I350 Nonrheumatic aortic (valve) stenosis: Secondary | ICD-10-CM

## 2018-07-07 NOTE — Progress Notes (Addendum)
CARDIOLOGY OFFICE NOTE  Date:  07/07/2018    [name] Date of Birth: 22-Feb-1941 Medical Record #[id number]  PCP:  Renato Shin, MD  Cardiologist:  [facility]    Chief Complaint  Patient presents with  . Congestive Heart Failure    History of Present Illness: [name] is a 77 y.o. male who presents today for a work in visit. Seen for Dr. Rayann Heman. He has been co-managed by [place] for many years.   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 On chronic anticoagulation with Eliquis.   He has had several cardioversions - last in Julyof 2016. He is now on amiodarone. Dr. Rayann Heman and I both have felt like he is not really any more symptomatic from his baseline regardless of his rhythm but Billie has typically preferred to be in NSR.  Admitted last year with respiratory failure/COPD exacerbation. EF was down to 45 to 50%.    I then saw him in November of 2018 - remained short of breath. Lots of cough. Tenuous situation. Not on optimal therapy and to try and put him on a better CHF regimen would need more frequent labs and transportation is such a big issue for him.   Got admitted again in January of 2019 - sepsis with strep bacteremia - unclear if due to ulcerations on his legs or from his device - he was on a prolonged course of antibiotics. Was not able to tolerate echo. TEE recommended but high risk for decompensation. Prolonged course of antibiotics given and he was to follow up with ID.  I last saw him in March - tried to simplify his medicines. Very tenuous situation. Last seen by Dr. Rayann Heman in May who agreed. There was discussion about upgrading his device - [place] declined.   He was admitted last month for  LE cellulitis and found to have worsening LV systolic function and now severe aortic stenosis. A repeat echo was ordered which showed a decline in LVEF from 45% down to 35 to 40%.There was moderate concentric LVH with pacemaker related dyssynergy of the septum but no regional wall motion abnormalities. The aortic valve was severely stenosed with a mean gradient of around 0.9 cm -mean gradient 41 mmHg and peak gradient of 71 mmHg.   Both Dr. Debara Pickett and Dr. Marlou Porch outlined in their hospital notes that they did not think that he would be a SAVR candidate, but may be a TAVR candidate. It was recommended to refer to valve clinic as outpatient for consideration, once his cellulitis had resolved. He is to see Dr. Burt Knack in August.   His HF was treated with IV Lasix, then transitioned to PO. He was continued on metoprolol. No ACE/ARB due to CKD and soft BP. Amiodarone and Eliquis continued for afib. He was discharge home on 40 mg of Lasix BID. He had a f/u BMP 1 week later which showed increase in SCr from 1.85 to 2.21. He was instructed by our office to reduce Lasix to 40 mg once daily.   Seen by Lyda Jester, PA about 2 weeks ago - main limitation was marked fatigue and weakness. He had completed his course of antibiotics again.   Since that visit he has had worsening edema - his Lasix was  increased back up. Follow up with me was arranged.   Comes in today. Here alone. Our staff had to help get him out of the car - wife dropped him off - he is not really able to ambulate/navigate our office. He notes that with the increase in his Lasix his weight is starting to go back down. He remains short of breath. No chest pain. No syncope. He basically feels bad all the time. His legs are being wrapped by home health. He did not understand what the work up for a TAVR involves.    Past Medical History:  Diagnosis Date  . Bladder cancer ([place]) 2014  . COPD (chronic obstructive pulmonary disease) (HCC)     CPAP  . Depression   . Diabetes mellitus    TypeII  . Diastolic dysfunction with chronic heart failure ([place])   . GERD (gastroesophageal reflux disease)   . H/O Legionnaire's disease 2003  . Hyperlipemia   . Hypothyroidism   . Morbid obesity ([place])   . OSA on CPAP   . Osteoarthritis    fingers  . Pancreatitis   . Peripheral neuropathy   . Persistent atrial fibrillation ([place])   . Renal insufficiency   . Sick sinus syndrome ([place]) 06/2010   s/p PPM by [employer]  . Venous insufficiency     Past Surgical History:  Procedure Laterality Date  . BELPHAROPTOSIS REPAIR Right    Glaucoma  . CARDIAC CATHETERIZATION     11-02-13  . CARDIOVERSION N/A 05/16/2014   Procedure: CARDIOVERSION;  Surgeon: Josue Hector, MD;  Location: [facility] ENDOSCOPY;  Service: Cardiovascular;  Laterality: N/A;  . CARDIOVERSION N/A 11/08/2014   Procedure: CARDIOVERSION;  Surgeon: Fay Records, MD;  Location: [place];  Service: Cardiovascular;  Laterality: N/A;  . CARDIOVERSION N/A 06/22/2015   Procedure: CARDIOVERSION;  Surgeon: Jerline Pain, MD;  Location: [place];  Service: Cardiovascular;  Laterality: N/A;  . CHOLECYSTECTOMY  2012  . CYSTOSCOPY W/ URETERAL STENT PLACEMENT Right 10/26/2013   Procedure: CYSTOSCOPY WITH RETROGRADE PYELOGRAM/URETERAL STENT PLACEMENT;  Surgeon: Alexis Frock, MD;  Location: [facility];  Service: Urology;  Laterality: Right;  . CYSTOSCOPY WITH URETEROSCOPY AND STENT PLACEMENT Right 01/11/2014   Procedure: CYSTOSCOPY WITH URETEROSCOPY ,RIGHT RETROGRADE AND STENT CHANGE AND LASER OF URETERAL TUMOR;  Surgeon: Alexis Frock, MD;  Location: [facility];  Service: Urology;  Laterality: Right;  . CYSTOSCOPY/RETROGRADE/URETEROSCOPY Right 08/23/2014   Procedure: CYSTOSCOPY/RETROGRADE/ DIAGNOSTIC URETEROSCOPY/RIGHT RENAL STONE EXTRACTION;  Surgeon: Alexis Frock, MD;  Location: [facility];  Service: Urology;  Laterality: Right;  . EMBOLECTOMY Left 11/02/2013   Procedure: LEFT FEMORAL EMBOLECTOMY, LEFT  FEMORAL ARTERY ENDARTERECTOMY WITH DACRON PATCH ANGIOPLASTY.;  Surgeon: Mal Misty, MD;  Location: [place];  Service: Vascular;  Laterality: Left;  . EYE SURGERY Left    cataract  . GROIN DEBRIDEMENT Right 05/08/2015   Procedure: REMOVAL OF RIGHT GROIN MASS;  Surgeon: Angelia Mould, MD;  Location: [place];  Service: Vascular;  Laterality: Right;  . HOLMIUM LASER APPLICATION Right 7/0/9628   Procedure: HOLMIUM LASER APPLICATION;  Surgeon: Alexis Frock, MD;  Location: [facility];  Service: Urology;  Laterality: Right;  . INSERT / REPLACE / REMOVE PACEMAKER  2011  . NASAL SEPTUM SURGERY  1967  . PACEMAKER PLACEMENT  06/2010   [facility] [employer] RF DR, Model [id number] ( Serial number [id number])  . TEE WITHOUT CARDIOVERSION N/A 05/16/2014   Procedure: TRANSESOPHAGEAL ECHOCARDIOGRAM (TEE);  Surgeon: Josue Hector, MD;  Location: [place];  Service: Cardiovascular;  Laterality: N/A;  . thromboembolectomy and four compartment fasciotomy Right 2009   leg  . TRANSURETHRAL RESECTION OF BLADDER TUMOR WITH GYRUS (TURBT-GYRUS) N/A 10/26/2013   Procedure: TRANSURETHRAL RESECTION OF BLADDER TUMOR WITH GYRUS (TURBT-GYRUS);  Surgeon: Alexis Frock, MD;  Location: [facility];  Service: Urology;  Laterality: N/A;  . TRANSURETHRAL RESECTION OF BLADDER TUMOR WITH GYRUS (TURBT-GYRUS) N/A 01/11/2014   Procedure: TRANSURETHRAL RESECTION OF BLADDER TUMOR WITH GYRUS (TURBT-GYRUS);  Surgeon: Alexis Frock, MD;  Location: [facility];  Service: Urology;  Laterality: N/A;  . WISDOM TOOTH EXTRACTION       Medications: Current Meds  Medication Sig  . acetaminophen (TYLENOL) 500 MG tablet Take 1,000 mg by mouth 2 (two) times daily as needed (pain/headache).   Marland Kitchen albuterol (VENTOLIN HFA) 108 (90 Base) MCG/ACT inhaler Inhale 2 puffs into the lungs every 6 (six) hours as needed for wheezing or shortness of breath.  Marland Kitchen amiodarone (PACERONE) 200 MG tablet Take 1 tablet (200 mg total) by mouth daily.  Marland Kitchen apixaban (ELIQUIS) 5 MG  TABS tablet Take 1 tablet (5 mg total) by mouth 2 (two) times daily.  . Ascorbic Acid (VITAMIN C) 500 MG tablet Take 500 mg by mouth daily.   . brimonidine (ALPHAGAN P) 0.1 % SOLN Place 1 drop into the left eye 3 (three) times daily.   . cholecalciferol (VITAMIN D) 1000 UNITS tablet Take 1,000 Units by mouth daily.   Marland Kitchen CINNAMON PO Take 2,000 mg by mouth daily.  Marland Kitchen dextromethorphan (DELSYM) 30 MG/5ML liquid Take 90 mg by mouth 2 (two) times daily as needed for cough.  . Digestive Enzymes (MULTI-ENZYME) TABS Take 1 tablet by mouth daily.  Marland Kitchen docusate sodium (COLACE) 100 MG capsule Take 100-200 mg by mouth daily as needed for mild constipation.   . dorzolamide (TRUSOPT) 2 % ophthalmic solution Place 1 drop into both eyes 2 (two) times daily.  . fluticasone (FLONASE) 50 MCG/ACT nasal spray Place 2 sprays into both nostrils as needed (congestion).   . furosemide (LASIX) 40 MG tablet Take 1 tablet (40 mg total) by mouth 2 (two) times daily.  Marland Kitchen guaiFENesin 200 MG tablet Take 400 mg by mouth 2 (two) times daily. 400mg  in the morning and 600mg  in the evening   . insulin regular (NOVOLIN R) 100 units/mL injection 4 times a day (just before each meal), 300-210-210-190 units (Patient taking differently: Inject 150-300 Units into the skin 4 (four) times daily. 4 times a day (just before each meal), 300-200-200-200 units)  . Lactobacillus (ACIDOPHILUS) CAPS capsule Take 1 capsule by mouth every evening.   Marland Kitchen levothyroxine (SYNTHROID, LEVOTHROID) 75 MCG tablet Take 1 tablet (75 mcg total) by mouth daily before breakfast.  . metoprolol tartrate (LOPRESSOR) 25 MG tablet Take 1 tablet (25 mg total) by mouth 2 (two) times daily.  . Multiple Vitamin (MULTIVITAMIN WITH MINERALS) TABS tablet Take 1 tablet by mouth every evening.   . Omega-3 Fatty Acids (FISH OIL) 1000 MG CAPS Take 1,000 mg by mouth daily.   Marland Kitchen omeprazole (PRILOSEC) 40 MG capsule Take 1 capsule (40 mg total) by mouth daily.  Marland Kitchen Resveratrol 100 MG CAPS Take  100 mg by mouth as needed.   . rosuvastatin (CRESTOR) 40 MG tablet Take 0.5 tablets (20 mg total) by mouth every evening.  . Timolol Maleate 0.5 % (DAILY) SOLN Place 1 drop into both eyes 2 (two) times daily.  . traMADol (ULTRAM) 50 MG tablet TAKE 2 TABLETS BY MOUTH TWICE A DAY AS NEEDED  FOR PAIN  . travoprost, benzalkonium, (TRAVATAN) 0.004 % ophthalmic solution Place 1 drop into the left eye at bedtime.     Allergies: Allergies  Allergen Reactions  . Actos [Pioglitazone] Swelling  . Timolol Other (See Comments)    Slow heart rate but tolerates Cosopt (dorzolamide-timolol) Pt has a rx for Timolol and uses it for glaucoma   . Ceftriaxone Rash    Social History: The patient  reports that he quit smoking about 11 years ago. His smoking use included cigarettes. He has a 100.00 pack-year smoking history. He has never used smokeless tobacco. He reports that he does not drink alcohol or use drugs.   Family History: The patient's family history includes Cancer in his mother; Heart attack in his father; Liver cancer in his mother.   Review of Systems: Please see the history of present illness.   Otherwise, the review of systems is positive for none.   All other systems are reviewed and negative.   Physical Exam: VS:  BP (!) 100/58 (BP Location: Left Arm, Patient Position: Sitting, Cuff Size: Large)   Pulse 74   Ht 5\' 10"  (1.778 m)   Wt (!) 311 lb 1.9 oz (141.1 kg)   SpO2 98% Comment: at rest  BMI 44.64 kg/m  .  BMI Body mass index is 44.64 kg/m.  Wt Readings from Last 3 Encounters:  07/07/18 (!) 311 lb 1.9 oz (141.1 kg)  07/01/18 (!) 310 lb 6.4 oz (140.8 kg)  06/23/18 (!) 303 lb 1.6 oz (137.5 kg)    General: Chronically ill appearing. He looks older than his stated age. He moans/groans with movement. He does not appear to be in any acute distress.   HEENT: Normal.  Neck: Supple, no JVD, carotid bruits, or masses noted.  Cardiac: Regular rate and rhythm. He has a harsh murmur  noted. Legs are quite red - not as swollen as I have seen previously.  Respiratory:  Lungs are clear to auscultation bilaterally with normal work of breathing.  GI: Massive pannus.  MS: No deformity or atrophy. Gait not tested.  Skin: Warm and dry. Color is sallow Neuro:  Strength and sensation are intact and no gross focal deficits noted.  Psych: Alert, appropriate and with normal affect.   LABORATORY DATA:  EKG:  EKG is not ordered today.  Lab Results  Component Value Date   WBC 9.0 06/22/2018   HGB 9.8 (A) 07/03/2018   HCT 34 (A) 07/03/2018   PLT 209 06/22/2018   GLUCOSE 217 (H) 06/22/2018   CHOL 133 05/06/2017   TRIG 396.0 (H) 05/06/2017   HDL 39.70 05/06/2017   LDLDIRECT 59.0 05/06/2017   LDLCALC  05/07/2008    UNABLE TO CALCULATE IF TRIGLYCERIDE OVER 400 mg/dL        Total Cholesterol/HDL:CHD Risk Coronary Heart Disease Risk Table                     Men   Women  1/2 Average Risk   3.4   3.3   ALT 23 06/04/2018   AST 27 06/04/2018   NA 138 06/22/2018   K 3.4 (L) 06/22/2018   CL 104 06/22/2018   CREATININE 1.62 (H) 06/22/2018   BUN 25 06/22/2018   CO2 18 (L) 06/22/2018   TSH 3.724 06/04/2018   PSA 0.73 05/06/2017   INR 1.33 07/28/2017   HGBA1C 8.0 (A) 07/01/2018   MICROALBUR 1.6 11/11/2017     BNP (last 3 results) Recent Labs  12/28/17 1751 05/29/18 2207 06/02/18 0334  BNP 767.4* 789.6* 1,005.6*    ProBNP (last 3 results) Recent Labs    10/26/17 1425  PROBNP 1,148*     Other Studies Reviewed Today:  2D Echo 16-Jun-2018 Study Conclusions  - Left ventricle: The cavity size was mildly dilated. There was moderate concentric hypertrophy. Systolic function was moderately reduced. The estimated ejection fraction was in the range of 35% to 40%. The study is not technically sufficient to allow evaluation of LV diastolic function. - Ventricular septum: Septal motion showed abnormal function, dyssynergy, and paradox. These changes are  consistent with right ventricular pacing. - Aortic valve: Valve mobility was restricted. There was severe stenosis. Valve area (VTI): 0.91 cm^2. Valve area (Vmax): 0.95 cm^2. Valve area (Vmean): 0.92 cm^2. - Mitral valve: Severely calcified annulus. Transvalvular velocity was minimally increased. The findings are consistent with trivial stenosis. There was mild regurgitation. Valve area by continuity equation (using LVOT flow): 1.81 cm^2. - Left atrium: The atrium was moderately dilated. - Right ventricle: The cavity size was moderately dilated. Systolic function was mildly reduced. - Right atrium: The atrium was mildly to moderately dilated. - Pulmonary arteries: Systolic pressure was mildly increased. PA peak pressure: 34 mm Hg (S).    Assessment/Plan:  1. Progressive aortic stenosis - he is not felt to be a candidate for SAVR. He was already deemed high risk to attempt a TEE. He was referred for TAVR consult with Dr. Burt Knack - we have talked about what this work up consists of - he is not wanting to proceed. I offered him to keep his appointment with Dr. Burt Knack for further discussion but [place] as declined and wishes to cancel. We have discussed continuing with our current management and I have offered Hospice services. His overall prognosis is poor - especially now with severe AS and chronic CHF - his goal is to remain at home with comfort/dignity.   2. Recurrent cellulitis - back off antibiotics - worrisome that he may seed/infect his device. Home health is wrapping his legs currently.   3. Chronic combined systolic and diastolic HF - EF is dropping. He has increased his Lasix back to 80 mg a day - his weights from his scales show it to be declining.   4. PAF - on amiodarone  5. High risk medicine  6. Morbid obesity with marked sedentary lifestyle. He has not been able to be on a better CHF regimen due to marked difficulty with transportation, getting to  appointments, etc.   7. HTN - BP ok today.   8. Underlying PPM - he has declined PPM upgrade or additional lead implant.   Current medicines are reviewed with the patient today.  The patient does not have concerns regarding medicines other than what has been noted above.  The following changes have been made:  See above.  Labs/ tests ordered today include:   No orders of the defined types were placed in this encounter.    Disposition:   Referral to Hospice. We took him to meet his wife who picked him up - she, herself, is not able to come to appointments. I have updated her about the current situation and plan of care.  Patient is agreeable to this plan and will call if any problems develop in the interim.   SignedTruitt Merle, NP  07/07/2018 2:41 PM  [facility] [address] [place], [place]  [address] Phone: [phone] Fax: [phone]  AddendumTrilby Drummer sent to Burtis Junes, NP        Patients family is calling to advise that the patient has passed away on 11/24/19 at [place].

## 2018-07-07 NOTE — Patient Instructions (Signed)
We will be checking the following labs today - NONE   Medication Instructions:    Continue with your current medicines.     Testing/Procedures To Be Arranged:  N/A  Follow-Up:   We will have the Hospice staff come and see you.     Other Special Instructions:   N/A    If you need a refill on your cardiac medications before your next appointment, please call your pharmacy.   Call the [place] office at [phone] if you have any questions, problems or concerns.

## 2018-07-07 NOTE — Telephone Encounter (Signed)
I tried to call patient but was unable to get an answer.

## 2018-07-07 NOTE — Telephone Encounter (Signed)
Faxing to Palliative Care @ [phone] pts referral form and ov note from today.

## 2018-07-07 NOTE — Telephone Encounter (Signed)
please call patient: Anemia persists.  You should take non-prescription iron, 1 pill, twice a day

## 2018-07-08 ENCOUNTER — Telehealth: Payer: Self-pay

## 2018-07-08 ENCOUNTER — Telehealth: Payer: Self-pay | Admitting: Endocrinology

## 2018-07-08 DIAGNOSIS — Z794 Long term (current) use of insulin: Secondary | ICD-10-CM | POA: Diagnosis not present

## 2018-07-08 DIAGNOSIS — N183 Chronic kidney disease, stage 3 (moderate): Secondary | ICD-10-CM | POA: Diagnosis not present

## 2018-07-08 DIAGNOSIS — J449 Chronic obstructive pulmonary disease, unspecified: Secondary | ICD-10-CM | POA: Diagnosis not present

## 2018-07-08 DIAGNOSIS — L03115 Cellulitis of right lower limb: Secondary | ICD-10-CM | POA: Diagnosis not present

## 2018-07-08 DIAGNOSIS — I5023 Acute on chronic systolic (congestive) heart failure: Secondary | ICD-10-CM | POA: Diagnosis not present

## 2018-07-08 DIAGNOSIS — R262 Difficulty in walking, not elsewhere classified: Secondary | ICD-10-CM | POA: Diagnosis not present

## 2018-07-08 DIAGNOSIS — L03116 Cellulitis of left lower limb: Secondary | ICD-10-CM | POA: Diagnosis not present

## 2018-07-08 DIAGNOSIS — E1122 Type 2 diabetes mellitus with diabetic chronic kidney disease: Secondary | ICD-10-CM | POA: Diagnosis not present

## 2018-07-08 DIAGNOSIS — J9601 Acute respiratory failure with hypoxia: Secondary | ICD-10-CM | POA: Diagnosis not present

## 2018-07-08 NOTE — Telephone Encounter (Signed)
Dorian Pod with [facility] ph# [phone] are going to continue seeing patient twice weekly for 1 month and then discharge patient

## 2018-07-08 NOTE — Telephone Encounter (Signed)
FYI

## 2018-07-08 NOTE — Telephone Encounter (Signed)
Phone call placed to patient to introduce palliative care and offer to schedule visit with NP. Scheduled for 07/12/18 @ 2pm. Address verified.

## 2018-07-09 NOTE — Telephone Encounter (Signed)
I have tried to call patient again & LVM.

## 2018-07-09 NOTE — Telephone Encounter (Signed)
Patient called back & he stated that he would buy non-prescription iron pill OTC.

## 2018-07-12 ENCOUNTER — Other Ambulatory Visit: Payer: Self-pay | Admitting: *Deleted

## 2018-07-12 ENCOUNTER — Other Ambulatory Visit: Payer: Medicare HMO | Admitting: Hospice and Palliative Medicine

## 2018-07-12 ENCOUNTER — Encounter: Payer: Self-pay | Admitting: *Deleted

## 2018-07-12 DIAGNOSIS — I5023 Acute on chronic systolic (congestive) heart failure: Secondary | ICD-10-CM | POA: Diagnosis not present

## 2018-07-12 DIAGNOSIS — J9601 Acute respiratory failure with hypoxia: Secondary | ICD-10-CM | POA: Diagnosis not present

## 2018-07-12 DIAGNOSIS — J449 Chronic obstructive pulmonary disease, unspecified: Secondary | ICD-10-CM | POA: Diagnosis not present

## 2018-07-12 DIAGNOSIS — R531 Weakness: Secondary | ICD-10-CM

## 2018-07-12 DIAGNOSIS — Z515 Encounter for palliative care: Secondary | ICD-10-CM

## 2018-07-12 DIAGNOSIS — N183 Chronic kidney disease, stage 3 (moderate): Secondary | ICD-10-CM | POA: Diagnosis not present

## 2018-07-12 DIAGNOSIS — L03115 Cellulitis of right lower limb: Secondary | ICD-10-CM | POA: Diagnosis not present

## 2018-07-12 DIAGNOSIS — L03116 Cellulitis of left lower limb: Secondary | ICD-10-CM | POA: Diagnosis not present

## 2018-07-12 DIAGNOSIS — Z794 Long term (current) use of insulin: Secondary | ICD-10-CM | POA: Diagnosis not present

## 2018-07-12 DIAGNOSIS — R262 Difficulty in walking, not elsewhere classified: Secondary | ICD-10-CM | POA: Diagnosis not present

## 2018-07-12 DIAGNOSIS — E1122 Type 2 diabetes mellitus with diabetic chronic kidney disease: Secondary | ICD-10-CM | POA: Diagnosis not present

## 2018-07-12 NOTE — Patient Outreach (Addendum)
[facility]) Care Management [facility] Community CM Telephone Outreach, Transition of Care day [place] Coordination x 3   07/12/2018  BLONG BUSK 10/15/1941 [id number]  Successful telephone outreach to [facility], 77 y.o.malereferred to [place] for transition of care after recent hospitalization June 22- June 08, 2018 for severe sepsis secondary to lower extremity edema/ cellulitis related to CHF exacerbation; patient had acute respiratory failure with hypoxia during hospitalization. Patient was discharged home to self-care with home health health services in place, as patient refused SNF rehabilitation placement post-hospital discharge. Patient has history including, but not limited to, DM- Type II; Atrial Fibrillation with previously placed pacemaker; CKD- stage III; aortic stenosis; COPD- on CPAP; CAD; HLD; and GERD. HIPAA/ identity verified with patient during phone call today.  Today, patient reports that home health nurse is currently at his home, and he states he will not be able to talk for very long.  Discussed with patient that I had reviewed cardiology office notes from office visit on 07/07/18, and noted that cardiology provider had completed a referral to Hospice, which patient acknowledges; states that he is expecting Hospice nurse to visit him at his home this afternoon.  Discussed with patient that if Hospice becomes active, then [place] would signs off; patient verbalizes understanding and agreement with this plan; further discussed with patient that I would re-attempt a call to him later this week to for update and to confirm whether or not Hospice services are active.  Patient denies further issues, concerns, or problems today, andI confirmed that patient hasmy direct phone number, the main [place] office phone number, and the [facility] CM 24-hour nurse advice phone number should issues arise prior to next scheduled  [place] outreachby phone later this week.   11:05 am:  Care Coordination Outreach to [place], RN with [name]) agency who is currently present at patient's home; [name] confirms that patient appears stable today and that she has continued following and treating legs with una boots; states that legs look less red/ inflamed with Una boots.  [name] reports that patient appears to be at his baseline in yellow CHF zone.  Reports patient daily weights are stable with weights over weekend ranging from 306.8- 307.6 lbs.    Discussed with [name] recent Hospice referral placed by cardiology provider; [name] confirms that if hospice becomes active, home health services will be completed.  [name] stated that she would continue following patient until she receives follow up/ confirmation that Hospice services are active.  11:10 am:  Placed care coordination note to Billey Chang, NP with Hospice/ [place], [phone]) as noted thorugh review of EMR and patient reporting that Merrily Pew has scheduled visit with patient at his home this afternoon.  Discussed overall patient case with Merrily Pew; Josh confirmed that he has visit with patient this afternoon, and clarified that if hospice services were ordered, he would facilitate making this transition, as his focus is palliative care (not hospice).  Discussed with Josh that if Hospice becomes active in patient's care, that [place] services would end, which Merrily Pew is familiar with.  Josh and I agreed to maintain communication in future around hospice referral.  1:55 pm:  Received follow up care coordination call from Billey Chang, NP with Hospice/ Palliative care [name] [phone]):  Josh confirmed that he had completed visit with patient this afternoon, and that patient agrees to [facility]; Josh confirmed  that he will be placing/ clarifying this referral today and that Hospice services should promptly  follow.  Plan:  Patient will take medications as prescribed and will attend all scheduled provider appointments  Patient will promptly notify care providers for any new concerns/ issues/ problems that arise  Patient will activelyengage with/participate in home health servicesasordered post-hospital discharge  Patient willcontinuemonitoring/ recording daily weightsand will report weight gain > 3 lbs overnight/ 5 lbs in one week to cardiology provider  Patient will continue communicating with [facility] Pharmacist regarding stated needs around medication assistance  [facility] Community CM outreachfor transition of careto continue with scheduledphone callto patient later thisweek to confirm Hospice services  [facility] CM Care Plan Problem One     Most Recent Value  Care Plan Problem One  Risk for hospital re-admission related to/ as evidenced by recent hospitalization for sepsis secondary to cellulitis from CHF  Role Documenting the Problem One  Care Management Coordinator  Care Plan for Problem One  Not Active  THN Long Term Goal   Over the next 31 days, patient will not experience hospital readmission, as evidenced by patient reporting and review of EMR during [facility] RN CCM outreach  [facility] Long Term Goal Start Date  06/09/18  [facility] Long Term Goal Met Date  07/12/18 - Goal Met  Interventions for Problem One Long Term Goal  Confirmed that patient has not experienced hospital readmission within 31 days of his last hospital discharge    [facility] CM Care Plan Problem Two     Most Recent Value  Care Plan Problem Two  Knowledge deficit around self-health management of chronic disease state of CHF, as evidenced by patient reporting  Role Documenting the Problem Two  Care Management Coordinator  Care Plan for Problem Two  Active  THN CM Short Term Goal #1   Over the next 30 days, patient will be able to verbalize signs/ symptoms and action plan for yellow zone of CHF zone, as evidenced by patient reporting  during [facility] RN CCM outreach  [facility] CM Short Term Goal #1 Start Date  06/23/18  Interventions for Short Term Goal #2   Placed care coordination/ collaboration call to home health nurse and obtained update on patient's clinical condition, recent weights at home, status of lower extremity edema with ongoing  treatment by [facility] RN    [facility] CM Care Plan Problem Three     Most Recent Value  Care Plan Problem Three  Need for clarification of patient's care goals, as evidenced by recent referall placed with Hospice by patient's cardiology provider  Role Documenting the Problem Three  Care Management [place] for Problem Three  Active  THN CM Short Term Goal #1   Over the next 14 days, patient will provide clarification of whether or not Hospice services are active in his care, as evidenced by patient reporting and care collaboration with Hospice/ palliative care team, as indicated  THN CM Short Term Goal #1 Start Date  07/12/18  Interventions for Short Term Goal #1  Discussed with patient recent referral placed by cardiology provider to Hospice,  confirmed with patient that he agrees with referral and that he expects to be visited by Hospice nurse this afternoon,  placed care coordination outreach to Hospce/ Palliative Care NP     Oneta Rack, RN, BSN, [place] Care Management  [phone]

## 2018-07-13 NOTE — Progress Notes (Signed)
PALLIATIVE CARE CONSULT VISIT   PATIENT NAME: [name] DOB: 1941/04/15 MRN: [id number]  PRIMARY CARE PROVIDER:   Renato Shin, MD  REFERRING PROVIDER:  Renato Shin, MD [address] [place], [place] [address]  RESPONSIBLE PARTY:  Self   ASSESSMENT:     I met with patient and his wife in the home. Patient says that he has mostly returned to his previous baseline following the hospitalization in July. However, patient has been declining over many months and is severely debilitated due to his cardiovascular and respiratory status. He is essentially chairbound and only able to ambulate short distances without fatigue and dyspnea. Patient was seen recently by cardiology for consideration of TAVR and patient says he is not interested in this. His primary goal is to remain at home and to try to prevent hospitalization. He wants to focus on comfort and dignity. We discussed hospice as a way to achieve these goals and patient would like to pursue hospice admission at home.   We discussed code status. Patient would not want to be resuscitated. I completed a DNR form today. He has a living will and would want his wife to make his medical decisons in the event that he was unable to do so.   RECOMMENDATIONS and PLAN:  1. DNR 2. Hospice Referral  I spent 45 minutes providing this consultation,  from 1330 to 1415. More than 50% of the time in this consultation was spent coordinating communication.   HISTORY OF PRESENT ILLNESS:  [name] is a 77 y.o. year old male with multiple medical problems including CM with EF 45% and h/o recurrent CHF, severe AoS, PAF and SSS s/p PPM on Eliquis, COPD, DM, CKD III, who was last hospitalized 05/29/18 to 06/08/18 with cellulitis. He was discharged home with home health. Palliative Care was asked to help address goals of care.   CODE STATUS: DNR  PPS: 40% HOSPICE ELIGIBILITY/DIAGNOSIS: YES  PAST MEDICAL HISTORY:  Past Medical History:    Diagnosis Date  . Bladder cancer ([place]) 2014  . COPD (chronic obstructive pulmonary disease) (HCC)    CPAP  . Depression   . Diabetes mellitus    TypeII  . Diastolic dysfunction with chronic heart failure ([place])   . GERD (gastroesophageal reflux disease)   . H/O Legionnaire's disease 2003  . Hyperlipemia   . Hypothyroidism   . Morbid obesity ([place])   . OSA on CPAP   . Osteoarthritis    fingers  . Pancreatitis   . Peripheral neuropathy   . Persistent atrial fibrillation ([place])   . Renal insufficiency   . Sick sinus syndrome ([place]) 06/2010   s/p PPM by [employer]  . Venous insufficiency     SOCIAL HX:  Social History   Tobacco Use  . Smoking status: Former Smoker    Packs/day: 2.00    Years: 50.00    Pack years: 100.00    Types: Cigarettes    Last attempt to quit: 12/08/2006    Years since quitting: 11.6  . Smokeless tobacco: Never Used  Substance Use Topics  . Alcohol use: No    Alcohol/week: 0.0 oz    Comment: quit 3 years ago    ALLERGIES:  Allergies  Allergen Reactions  . Actos [Pioglitazone] Swelling  . Timolol Other (See Comments)    Slow heart rate but tolerates Cosopt (dorzolamide-timolol) Pt has a rx for Timolol and uses it for glaucoma   . Ceftriaxone Rash  PERTINENT MEDICATIONS:  Outpatient Encounter Medications as of 07/12/2018  Medication Sig  . acetaminophen (TYLENOL) 500 MG tablet Take 1,000 mg by mouth 2 (two) times daily as needed (pain/headache).   Marland Kitchen albuterol (VENTOLIN HFA) 108 (90 Base) MCG/ACT inhaler Inhale 2 puffs into the lungs every 6 (six) hours as needed for wheezing or shortness of breath.  Marland Kitchen amiodarone (PACERONE) 200 MG tablet Take 1 tablet (200 mg total) by mouth daily.  Marland Kitchen apixaban (ELIQUIS) 5 MG TABS tablet Take 1 tablet (5 mg total) by mouth 2 (two) times daily.  . Ascorbic Acid (VITAMIN C) 500 MG tablet Take 500 mg by mouth daily.   . brimonidine (ALPHAGAN P) 0.1 % SOLN Place 1 drop into the left eye 3 (three) times daily.   .  cholecalciferol (VITAMIN D) 1000 UNITS tablet Take 1,000 Units by mouth daily.   Marland Kitchen CINNAMON PO Take 2,000 mg by mouth daily.  Marland Kitchen dextromethorphan (DELSYM) 30 MG/5ML liquid Take 90 mg by mouth 2 (two) times daily as needed for cough.  . Digestive Enzymes (MULTI-ENZYME) TABS Take 1 tablet by mouth daily.  Marland Kitchen docusate sodium (COLACE) 100 MG capsule Take 100-200 mg by mouth daily as needed for mild constipation.   . dorzolamide (TRUSOPT) 2 % ophthalmic solution Place 1 drop into both eyes 2 (two) times daily.  . fluticasone (FLONASE) 50 MCG/ACT nasal spray Place 2 sprays into both nostrils as needed (congestion).   . furosemide (LASIX) 40 MG tablet Take 1 tablet (40 mg total) by mouth 2 (two) times daily.  Marland Kitchen guaiFENesin 200 MG tablet Take 400 mg by mouth 2 (two) times daily. 432m in the morning and 6015min the evening   . insulin regular (NOVOLIN R) 100 units/mL injection 4 times a day (just before each meal), 300-210-210-190 units (Patient taking differently: Inject 150-300 Units into the skin 4 (four) times daily. 4 times a day (just before each meal), 300-200-200-200 units)  . Lactobacillus (ACIDOPHILUS) CAPS capsule Take 1 capsule by mouth every evening.   . Marland Kitchenevothyroxine (SYNTHROID, LEVOTHROID) 75 MCG tablet Take 1 tablet (75 mcg total) by mouth daily before breakfast.  . metoprolol tartrate (LOPRESSOR) 25 MG tablet Take 1 tablet (25 mg total) by mouth 2 (two) times daily.  . Multiple Vitamin (MULTIVITAMIN WITH MINERALS) TABS tablet Take 1 tablet by mouth every evening.   . Omega-3 Fatty Acids (FISH OIL) 1000 MG CAPS Take 1,000 mg by mouth daily.   . Marland Kitchenmeprazole (PRILOSEC) 40 MG capsule Take 1 capsule (40 mg total) by mouth daily.  . Marland Kitchenesveratrol 100 MG CAPS Take 100 mg by mouth as needed.   . rosuvastatin (CRESTOR) 40 MG tablet Take 0.5 tablets (20 mg total) by mouth every evening.  . Timolol Maleate 0.5 % (DAILY) SOLN Place 1 drop into both eyes 2 (two) times daily.  . traMADol (ULTRAM) 50 MG  tablet TAKE 2 TABLETS BY MOUTH TWICE A DAY AS NEEDED FOR PAIN  . travoprost, benzalkonium, (TRAVATAN) 0.004 % ophthalmic solution Place 1 drop into the left eye at bedtime.   No facility-administered encounter medications on file as of 07/12/2018.     PHYSICAL EXAM:   General: NAD, frail appearing, obese Cardiovascular:irregular, systolic murmur Pulmonary: poor air movement without wheeze Abdomen:  nontender, + bowel sounds Extremities: B. Unna boots Skin: ulcers noted but not visualized Neurological: Weakness but otherwise nonfocal  JOIrean HongNP

## 2018-07-14 ENCOUNTER — Encounter: Payer: Self-pay | Admitting: *Deleted

## 2018-07-14 ENCOUNTER — Other Ambulatory Visit: Payer: Self-pay | Admitting: *Deleted

## 2018-07-14 NOTE — Patient Outreach (Signed)
[place] [facility]) Care Management  07/14/2018  DYKE WEIBLE [birth year] [id number]  Unsuccessful telephone outreach toOdis Renae Gloss y.o.malereferred to [place] for transition of care after recent hospitalization June 22- June 08, 2018 for severe sepsis secondary to lower extremity edema/ cellulitis related to CHF exacerbation; patient had acute respiratory failure with hypoxia during hospitalization. Patient was discharged home to self-care with home health health services in place, as patient refused SNF rehabilitation placement post-hospital discharge. Patient has history including, but not limited to, DM- Type II; Atrial Fibrillation with previously placed pacemaker; CKD- stage III; aortic stenosis; COPD- on CPAP; CAD; HLD; and GERD.  Call placed today after earlier phone call to patient this week to follow up on patient status/ possible Hospice services in place.  HIPAA compliant voice mail message left for patient, requesting return call back.  Plan:  Will re-attempt [place] telephone outreach later this week if I do not hear back from patient first.  Oneta Rack, RN, BSN, [place] Coordinator [facility] Care Management  [phone]

## 2018-07-15 ENCOUNTER — Telehealth: Payer: Self-pay | Admitting: Nurse Practitioner

## 2018-07-15 ENCOUNTER — Other Ambulatory Visit: Payer: Self-pay | Admitting: *Deleted

## 2018-07-15 DIAGNOSIS — I5023 Acute on chronic systolic (congestive) heart failure: Secondary | ICD-10-CM | POA: Diagnosis not present

## 2018-07-15 DIAGNOSIS — N183 Chronic kidney disease, stage 3 (moderate): Secondary | ICD-10-CM | POA: Diagnosis not present

## 2018-07-15 DIAGNOSIS — J9601 Acute respiratory failure with hypoxia: Secondary | ICD-10-CM | POA: Diagnosis not present

## 2018-07-15 DIAGNOSIS — Z794 Long term (current) use of insulin: Secondary | ICD-10-CM | POA: Diagnosis not present

## 2018-07-15 DIAGNOSIS — J449 Chronic obstructive pulmonary disease, unspecified: Secondary | ICD-10-CM | POA: Diagnosis not present

## 2018-07-15 DIAGNOSIS — E1122 Type 2 diabetes mellitus with diabetic chronic kidney disease: Secondary | ICD-10-CM | POA: Diagnosis not present

## 2018-07-15 DIAGNOSIS — L03116 Cellulitis of left lower limb: Secondary | ICD-10-CM | POA: Diagnosis not present

## 2018-07-15 DIAGNOSIS — R262 Difficulty in walking, not elsewhere classified: Secondary | ICD-10-CM | POA: Diagnosis not present

## 2018-07-15 DIAGNOSIS — L03115 Cellulitis of right lower limb: Secondary | ICD-10-CM | POA: Diagnosis not present

## 2018-07-15 NOTE — Telephone Encounter (Signed)
New Message:      Amber from Hospice and Palliative is calling and states she needs to know who will be signing the orders for the hospice care. She states the pt belongs to [facility]. Please advise as of to who it should be.

## 2018-07-15 NOTE — Telephone Encounter (Signed)
Call returned to [employer].  Per review of Pt chart it appears LG had referred Pt for palliative care.  Per [employer]- she cannot send someone out to begin induction of hospice until she confirms orders will be signed.  Advised LG would be back to review on Monday.

## 2018-07-15 NOTE — Patient Outreach (Signed)
[place] [facility]) [place] Coordination x 5  07/15/2018  [name] [birth year] [id number]  Multiple successful care coordination telephone outreach re:[place] M FTDDUK,77 y.o.malereferred to [place] for transition of care after recent hospitalization June 22- June 08, 2018 for severe sepsis secondary to lower extremity edema/ cellulitis related to CHF exacerbation; patient had acute respiratory failure with hypoxia during hospitalization. Patient was discharged home to self-care with home health health [facility]) services in place, as patient refused SNF rehabilitation placement post-hospital discharge. Patient has history including, but not limited to, DM- Type II; Atrial Fibrillation with previously placed pacemaker; CKD- stage III; aortic stenosis; COPD- on CPAP; CAD; HLD; and GERD.  11:55- 12:15 pm:  Received incoming call from [place], [facility] RN stating that she is currently at patient's home and patient reports that he has not yet heard back from Hospice after earlier visit by Palliative Care NP, [place]; placed call to [facility] after previous care coordination outreach this week, when Josh confirmed that he had placed referral to Hospice after visiting with patient in his home; Merrily Pew again confirmed that this order had been placed, and stated that he would follow up to confirm.  Josh called back after earlier call, and confirmed that he had asked Hospice team member to re-confirm/ place order for Hospice and that patient should be hearing from Hospice soon for nurse to make assessment/ intake visit to patient's home; I then contacted home health nurse back and updated her.  Plan:  Will continue collaborating with home health and Hospice teams as indicated; will reach out to patient next week to confirm that Hospice services are active  Oneta Rack, RN, BSN, [place] Care  Management  [phone]

## 2018-07-16 ENCOUNTER — Ambulatory Visit: Payer: Self-pay | Admitting: *Deleted

## 2018-07-16 DIAGNOSIS — I4891 Unspecified atrial fibrillation: Secondary | ICD-10-CM | POA: Diagnosis not present

## 2018-07-16 DIAGNOSIS — G4733 Obstructive sleep apnea (adult) (pediatric): Secondary | ICD-10-CM | POA: Diagnosis not present

## 2018-07-16 DIAGNOSIS — I509 Heart failure, unspecified: Secondary | ICD-10-CM | POA: Diagnosis not present

## 2018-07-16 DIAGNOSIS — K219 Gastro-esophageal reflux disease without esophagitis: Secondary | ICD-10-CM | POA: Diagnosis not present

## 2018-07-16 DIAGNOSIS — J449 Chronic obstructive pulmonary disease, unspecified: Secondary | ICD-10-CM | POA: Diagnosis not present

## 2018-07-16 DIAGNOSIS — N183 Chronic kidney disease, stage 3 (moderate): Secondary | ICD-10-CM | POA: Diagnosis not present

## 2018-07-16 DIAGNOSIS — E1159 Type 2 diabetes mellitus with other circulatory complications: Secondary | ICD-10-CM | POA: Diagnosis not present

## 2018-07-16 DIAGNOSIS — G619 Inflammatory polyneuropathy, unspecified: Secondary | ICD-10-CM | POA: Diagnosis not present

## 2018-07-16 DIAGNOSIS — E039 Hypothyroidism, unspecified: Secondary | ICD-10-CM | POA: Diagnosis not present

## 2018-07-16 DIAGNOSIS — J301 Allergic rhinitis due to pollen: Secondary | ICD-10-CM | POA: Diagnosis not present

## 2018-07-16 DIAGNOSIS — E669 Obesity, unspecified: Secondary | ICD-10-CM | POA: Diagnosis not present

## 2018-07-16 DIAGNOSIS — D649 Anemia, unspecified: Secondary | ICD-10-CM | POA: Diagnosis not present

## 2018-07-16 DIAGNOSIS — I25119 Atherosclerotic heart disease of native coronary artery with unspecified angina pectoris: Secondary | ICD-10-CM | POA: Diagnosis not present

## 2018-07-16 DIAGNOSIS — H409 Unspecified glaucoma: Secondary | ICD-10-CM | POA: Diagnosis not present

## 2018-07-16 DIAGNOSIS — I350 Nonrheumatic aortic (valve) stenosis: Secondary | ICD-10-CM | POA: Diagnosis not present

## 2018-07-16 NOTE — Telephone Encounter (Signed)
I will be happy to sign his orders for Hospice.

## 2018-07-16 NOTE — Telephone Encounter (Signed)
S/w Jenn at [phone] with Hospice/Palliative Care.  Stated Cecille Rubin would be happy to sign orders.

## 2018-07-19 ENCOUNTER — Encounter: Payer: Self-pay | Admitting: *Deleted

## 2018-07-19 ENCOUNTER — Other Ambulatory Visit: Payer: Self-pay | Admitting: *Deleted

## 2018-07-19 DIAGNOSIS — I350 Nonrheumatic aortic (valve) stenosis: Secondary | ICD-10-CM | POA: Diagnosis not present

## 2018-07-19 DIAGNOSIS — I25119 Atherosclerotic heart disease of native coronary artery with unspecified angina pectoris: Secondary | ICD-10-CM | POA: Diagnosis not present

## 2018-07-19 DIAGNOSIS — I509 Heart failure, unspecified: Secondary | ICD-10-CM | POA: Diagnosis not present

## 2018-07-19 DIAGNOSIS — E1159 Type 2 diabetes mellitus with other circulatory complications: Secondary | ICD-10-CM | POA: Diagnosis not present

## 2018-07-19 DIAGNOSIS — E669 Obesity, unspecified: Secondary | ICD-10-CM | POA: Diagnosis not present

## 2018-07-19 DIAGNOSIS — J449 Chronic obstructive pulmonary disease, unspecified: Secondary | ICD-10-CM | POA: Diagnosis not present

## 2018-07-19 NOTE — Patient Outreach (Signed)
[place] [facility]) Care Management [facility] Community CM Case Closure Outreach [facility] Community CM Telephone Outreach Care Coordination x 1 07/19/2018  PHAT DALTON July 31, 1941 [id number]   10:30 am:  Successful telephone outreach to [facility],77 y.o.malereferred to [place] for transition of care after recent hospitalization June 22- June 08, 2018 for severe sepsis secondary to lower extremity edema/ cellulitis related to CHF exacerbation; patient had acute respiratory failure with hypoxia during hospitalization. Patient was discharged home to self-care with home health health services in place, as patient refused SNF rehabilitation placement post-hospital discharge. Patient has history including, but not limited to, DM- Type II; Atrial Fibrillation with previously placed pacemaker; CKD- stage III; aortic stenosis; COPD- on CPAP; CAD; HLD; and GERD.  HIPAA/ identity verified with patient during phone call today.  Today, patient is having difficulty hearing me on phone; with much effort, was able to finally converse with patient where he could hear; patient states today that he is "doing fine," and he tells me that Hospice is coming to visit/ enroll him this morning and that he is now active with Hospice.  Again explained to patient that [place] services would end now that hospice is active, and he verbalizes understanding and agreement; encouraged patient to contact me if anything changed around hospice services, and he agreed to do so, and confirmed that he has my direct phone number, the main [facility] CM office phone number, and the [facility] CM 24-hour nurse advice phone number.  10:35 am:  Wilford Corner, RN with [facility] home health [facility]) agency; shared with [name] that I had spoken to patient, who confirms that hospice will be visiting him today to enroll him; [name] confirmed that once patient is enrolled, Hospice will notify [facility] agency which would then become inactive; confirmed  with [name] that she had my direct phone number should hospice not become active with patient; [name] agrees to contact me if/ as indicated.   Plan:  Will close patient's [place] CM case, as hospice is now active, and will make patient's PCP aware of same- will send [facility] CM PCP case closure letter  Oneta Rack, RN, BSN, [place] Coordinator [facility] Care Management  [phone]

## 2018-07-23 ENCOUNTER — Other Ambulatory Visit: Payer: Self-pay | Admitting: Internal Medicine

## 2018-07-23 ENCOUNTER — Telehealth: Payer: Self-pay | Admitting: Endocrinology

## 2018-07-23 DIAGNOSIS — E1159 Type 2 diabetes mellitus with other circulatory complications: Secondary | ICD-10-CM | POA: Diagnosis not present

## 2018-07-23 DIAGNOSIS — I350 Nonrheumatic aortic (valve) stenosis: Secondary | ICD-10-CM | POA: Diagnosis not present

## 2018-07-23 DIAGNOSIS — I25119 Atherosclerotic heart disease of native coronary artery with unspecified angina pectoris: Secondary | ICD-10-CM | POA: Diagnosis not present

## 2018-07-23 DIAGNOSIS — H401133 Primary open-angle glaucoma, bilateral, severe stage: Secondary | ICD-10-CM | POA: Diagnosis not present

## 2018-07-23 DIAGNOSIS — J449 Chronic obstructive pulmonary disease, unspecified: Secondary | ICD-10-CM | POA: Diagnosis not present

## 2018-07-23 DIAGNOSIS — H40052 Ocular hypertension, left eye: Secondary | ICD-10-CM | POA: Diagnosis not present

## 2018-07-23 DIAGNOSIS — E669 Obesity, unspecified: Secondary | ICD-10-CM | POA: Diagnosis not present

## 2018-07-23 DIAGNOSIS — I509 Heart failure, unspecified: Secondary | ICD-10-CM | POA: Diagnosis not present

## 2018-07-23 NOTE — Telephone Encounter (Signed)
Just [facility] care has called and patient has been admitted into their care and had to cancel appt. And will not need any future appts at this time

## 2018-07-23 NOTE — Telephone Encounter (Signed)
Okay to refill? Please advise. Thanks, MI 

## 2018-07-26 ENCOUNTER — Ambulatory Visit: Payer: Medicare HMO | Admitting: Cardiovascular Disease

## 2018-07-27 DIAGNOSIS — J449 Chronic obstructive pulmonary disease, unspecified: Secondary | ICD-10-CM | POA: Diagnosis not present

## 2018-07-27 DIAGNOSIS — E1159 Type 2 diabetes mellitus with other circulatory complications: Secondary | ICD-10-CM | POA: Diagnosis not present

## 2018-07-27 DIAGNOSIS — E669 Obesity, unspecified: Secondary | ICD-10-CM | POA: Diagnosis not present

## 2018-07-27 DIAGNOSIS — I25119 Atherosclerotic heart disease of native coronary artery with unspecified angina pectoris: Secondary | ICD-10-CM | POA: Diagnosis not present

## 2018-07-27 DIAGNOSIS — I509 Heart failure, unspecified: Secondary | ICD-10-CM | POA: Diagnosis not present

## 2018-07-27 DIAGNOSIS — I350 Nonrheumatic aortic (valve) stenosis: Secondary | ICD-10-CM | POA: Diagnosis not present

## 2018-07-30 DIAGNOSIS — E1159 Type 2 diabetes mellitus with other circulatory complications: Secondary | ICD-10-CM | POA: Diagnosis not present

## 2018-07-30 DIAGNOSIS — I509 Heart failure, unspecified: Secondary | ICD-10-CM | POA: Diagnosis not present

## 2018-07-30 DIAGNOSIS — I25119 Atherosclerotic heart disease of native coronary artery with unspecified angina pectoris: Secondary | ICD-10-CM | POA: Diagnosis not present

## 2018-07-30 DIAGNOSIS — I350 Nonrheumatic aortic (valve) stenosis: Secondary | ICD-10-CM | POA: Diagnosis not present

## 2018-07-30 DIAGNOSIS — J449 Chronic obstructive pulmonary disease, unspecified: Secondary | ICD-10-CM | POA: Diagnosis not present

## 2018-07-30 DIAGNOSIS — E669 Obesity, unspecified: Secondary | ICD-10-CM | POA: Diagnosis not present

## 2018-08-04 ENCOUNTER — Ambulatory Visit (INDEPENDENT_AMBULATORY_CARE_PROVIDER_SITE_OTHER): Admitting: *Deleted

## 2018-08-04 DIAGNOSIS — I509 Heart failure, unspecified: Secondary | ICD-10-CM | POA: Diagnosis not present

## 2018-08-04 DIAGNOSIS — J449 Chronic obstructive pulmonary disease, unspecified: Secondary | ICD-10-CM | POA: Diagnosis not present

## 2018-08-04 DIAGNOSIS — I25119 Atherosclerotic heart disease of native coronary artery with unspecified angina pectoris: Secondary | ICD-10-CM | POA: Diagnosis not present

## 2018-08-04 DIAGNOSIS — I350 Nonrheumatic aortic (valve) stenosis: Secondary | ICD-10-CM | POA: Diagnosis not present

## 2018-08-04 DIAGNOSIS — I495 Sick sinus syndrome: Secondary | ICD-10-CM | POA: Diagnosis not present

## 2018-08-04 DIAGNOSIS — E669 Obesity, unspecified: Secondary | ICD-10-CM | POA: Diagnosis not present

## 2018-08-04 DIAGNOSIS — E1159 Type 2 diabetes mellitus with other circulatory complications: Secondary | ICD-10-CM | POA: Diagnosis not present

## 2018-08-04 NOTE — Progress Notes (Signed)
Remote pacemaker transmission.   

## 2018-08-05 ENCOUNTER — Ambulatory Visit: Payer: Medicare HMO | Admitting: Endocrinology

## 2018-08-06 ENCOUNTER — Telehealth: Payer: Self-pay | Admitting: Endocrinology

## 2018-08-06 DIAGNOSIS — I350 Nonrheumatic aortic (valve) stenosis: Secondary | ICD-10-CM | POA: Diagnosis not present

## 2018-08-06 DIAGNOSIS — E1159 Type 2 diabetes mellitus with other circulatory complications: Secondary | ICD-10-CM | POA: Diagnosis not present

## 2018-08-06 DIAGNOSIS — I509 Heart failure, unspecified: Secondary | ICD-10-CM | POA: Diagnosis not present

## 2018-08-06 DIAGNOSIS — I25119 Atherosclerotic heart disease of native coronary artery with unspecified angina pectoris: Secondary | ICD-10-CM | POA: Diagnosis not present

## 2018-08-06 DIAGNOSIS — J449 Chronic obstructive pulmonary disease, unspecified: Secondary | ICD-10-CM | POA: Diagnosis not present

## 2018-08-06 DIAGNOSIS — E669 Obesity, unspecified: Secondary | ICD-10-CM | POA: Diagnosis not present

## 2018-08-06 NOTE — Telephone Encounter (Signed)
Jessica from Hospice called. She is requesting a call to discuss pt's options for insulin and diabetes management due to cost. She's requesting a call back as soon as possible being it's time sensitive. Can reach her at [phone]

## 2018-08-08 DIAGNOSIS — I350 Nonrheumatic aortic (valve) stenosis: Secondary | ICD-10-CM | POA: Diagnosis not present

## 2018-08-08 DIAGNOSIS — I4891 Unspecified atrial fibrillation: Secondary | ICD-10-CM | POA: Diagnosis not present

## 2018-08-08 DIAGNOSIS — E039 Hypothyroidism, unspecified: Secondary | ICD-10-CM | POA: Diagnosis not present

## 2018-08-08 DIAGNOSIS — G4733 Obstructive sleep apnea (adult) (pediatric): Secondary | ICD-10-CM | POA: Diagnosis not present

## 2018-08-08 DIAGNOSIS — J449 Chronic obstructive pulmonary disease, unspecified: Secondary | ICD-10-CM | POA: Diagnosis not present

## 2018-08-08 DIAGNOSIS — E1159 Type 2 diabetes mellitus with other circulatory complications: Secondary | ICD-10-CM | POA: Diagnosis not present

## 2018-08-08 DIAGNOSIS — J301 Allergic rhinitis due to pollen: Secondary | ICD-10-CM | POA: Diagnosis not present

## 2018-08-08 DIAGNOSIS — K219 Gastro-esophageal reflux disease without esophagitis: Secondary | ICD-10-CM | POA: Diagnosis not present

## 2018-08-08 DIAGNOSIS — G619 Inflammatory polyneuropathy, unspecified: Secondary | ICD-10-CM | POA: Diagnosis not present

## 2018-08-08 DIAGNOSIS — H409 Unspecified glaucoma: Secondary | ICD-10-CM | POA: Diagnosis not present

## 2018-08-08 DIAGNOSIS — I25119 Atherosclerotic heart disease of native coronary artery with unspecified angina pectoris: Secondary | ICD-10-CM | POA: Diagnosis not present

## 2018-08-08 DIAGNOSIS — N183 Chronic kidney disease, stage 3 (moderate): Secondary | ICD-10-CM | POA: Diagnosis not present

## 2018-08-08 DIAGNOSIS — D649 Anemia, unspecified: Secondary | ICD-10-CM | POA: Diagnosis not present

## 2018-08-08 DIAGNOSIS — I509 Heart failure, unspecified: Secondary | ICD-10-CM | POA: Diagnosis not present

## 2018-08-08 DIAGNOSIS — E669 Obesity, unspecified: Secondary | ICD-10-CM | POA: Diagnosis not present

## 2018-08-10 ENCOUNTER — Other Ambulatory Visit: Payer: Self-pay | Admitting: Endocrinology

## 2018-08-10 DIAGNOSIS — E7800 Pure hypercholesterolemia, unspecified: Secondary | ICD-10-CM

## 2018-08-10 DIAGNOSIS — I25119 Atherosclerotic heart disease of native coronary artery with unspecified angina pectoris: Secondary | ICD-10-CM | POA: Diagnosis not present

## 2018-08-10 DIAGNOSIS — I350 Nonrheumatic aortic (valve) stenosis: Secondary | ICD-10-CM | POA: Diagnosis not present

## 2018-08-10 DIAGNOSIS — E1159 Type 2 diabetes mellitus with other circulatory complications: Secondary | ICD-10-CM | POA: Diagnosis not present

## 2018-08-10 DIAGNOSIS — E669 Obesity, unspecified: Secondary | ICD-10-CM | POA: Diagnosis not present

## 2018-08-10 DIAGNOSIS — J449 Chronic obstructive pulmonary disease, unspecified: Secondary | ICD-10-CM | POA: Diagnosis not present

## 2018-08-10 DIAGNOSIS — E1122 Type 2 diabetes mellitus with diabetic chronic kidney disease: Secondary | ICD-10-CM

## 2018-08-10 DIAGNOSIS — N183 Chronic kidney disease, stage 3 unspecified: Secondary | ICD-10-CM

## 2018-08-10 DIAGNOSIS — I509 Heart failure, unspecified: Secondary | ICD-10-CM | POA: Diagnosis not present

## 2018-08-10 DIAGNOSIS — Z794 Long term (current) use of insulin: Secondary | ICD-10-CM

## 2018-08-10 DIAGNOSIS — R0600 Dyspnea, unspecified: Secondary | ICD-10-CM

## 2018-08-10 DIAGNOSIS — D500 Iron deficiency anemia secondary to blood loss (chronic): Secondary | ICD-10-CM

## 2018-08-16 ENCOUNTER — Telehealth: Payer: Self-pay | Admitting: *Deleted

## 2018-08-16 NOTE — Telephone Encounter (Signed)
Faxed to Hospice @ [phone] phone # [phone] Orders.

## 2018-08-17 DIAGNOSIS — J449 Chronic obstructive pulmonary disease, unspecified: Secondary | ICD-10-CM | POA: Diagnosis not present

## 2018-08-17 DIAGNOSIS — E1159 Type 2 diabetes mellitus with other circulatory complications: Secondary | ICD-10-CM | POA: Diagnosis not present

## 2018-08-17 DIAGNOSIS — I350 Nonrheumatic aortic (valve) stenosis: Secondary | ICD-10-CM | POA: Diagnosis not present

## 2018-08-17 DIAGNOSIS — E669 Obesity, unspecified: Secondary | ICD-10-CM | POA: Diagnosis not present

## 2018-08-17 DIAGNOSIS — I509 Heart failure, unspecified: Secondary | ICD-10-CM | POA: Diagnosis not present

## 2018-08-17 DIAGNOSIS — I25119 Atherosclerotic heart disease of native coronary artery with unspecified angina pectoris: Secondary | ICD-10-CM | POA: Diagnosis not present

## 2018-08-18 ENCOUNTER — Other Ambulatory Visit: Payer: Self-pay | Admitting: Pharmacist

## 2018-08-18 NOTE — Patient Outreach (Signed)
[place] [facility]) Care Management  08/18/2018  [name] [birth year] [id number]  Successful call placed to Mr. Bracco today.  Patient reports he is doing "ok."  He reports he is now with Hospice and is receiving medication assistance for Apixaban and insulin.  He reports he does not receive assistance for his eye drops but is no longer interested in applying for patient assistance programs.  He denies any other medication related questions at this time.   Plan: I will close [place] case but am happy to assist in the future as needed.   Ralene Bathe, PharmD, [place] [phone]

## 2018-08-23 ENCOUNTER — Telehealth: Payer: Self-pay | Admitting: *Deleted

## 2018-08-23 NOTE — Telephone Encounter (Signed)
Faxing to hospice @ [phone] orders.

## 2018-08-26 ENCOUNTER — Telehealth: Payer: Self-pay | Admitting: Nurse Practitioner

## 2018-08-26 DIAGNOSIS — E669 Obesity, unspecified: Secondary | ICD-10-CM | POA: Diagnosis not present

## 2018-08-26 DIAGNOSIS — I350 Nonrheumatic aortic (valve) stenosis: Secondary | ICD-10-CM | POA: Diagnosis not present

## 2018-08-26 DIAGNOSIS — I509 Heart failure, unspecified: Secondary | ICD-10-CM | POA: Diagnosis not present

## 2018-08-26 DIAGNOSIS — J449 Chronic obstructive pulmonary disease, unspecified: Secondary | ICD-10-CM | POA: Diagnosis not present

## 2018-08-26 DIAGNOSIS — E1159 Type 2 diabetes mellitus with other circulatory complications: Secondary | ICD-10-CM | POA: Diagnosis not present

## 2018-08-26 DIAGNOSIS — I25119 Atherosclerotic heart disease of native coronary artery with unspecified angina pectoris: Secondary | ICD-10-CM | POA: Diagnosis not present

## 2018-08-26 NOTE — Telephone Encounter (Signed)
° ° °  [name] from [place] requesting refill for patient., Dr [name] no longer involved in care per [name].  Any questions call [phone]   1. Which medications need to be refilled? (please list name of each medication and dose if known) traMADol (ULTRAM) 50 MG tablet  2. Which pharmacy/location (including street and city if local pharmacy) is medication to be sent to? [id number] [name], [place] - [address]  3. Do they need a 30 day or 90 day supply? [place]

## 2018-08-26 NOTE — Telephone Encounter (Signed)
Duplicate/ no note needed/ error- KW

## 2018-08-27 ENCOUNTER — Other Ambulatory Visit: Payer: Self-pay | Admitting: Nurse Practitioner

## 2018-08-27 NOTE — Telephone Encounter (Signed)
S/w Janett Billow from Hospice stated is not working today.  Stated to call house doctor to prescribe tramadol.  If not can scribe on Monday when [name] is back in office.  Stated the house doctor can do it.  Will send to [place] to [place].

## 2018-08-27 NOTE — Telephone Encounter (Signed)
Left message on machine for [name] from hospice to contact the office.

## 2018-08-29 NOTE — Telephone Encounter (Signed)
Noted  

## 2018-09-02 ENCOUNTER — Telehealth: Payer: Self-pay | Admitting: Nurse Practitioner

## 2018-09-02 DIAGNOSIS — E1159 Type 2 diabetes mellitus with other circulatory complications: Secondary | ICD-10-CM | POA: Diagnosis not present

## 2018-09-02 DIAGNOSIS — E669 Obesity, unspecified: Secondary | ICD-10-CM | POA: Diagnosis not present

## 2018-09-02 DIAGNOSIS — J449 Chronic obstructive pulmonary disease, unspecified: Secondary | ICD-10-CM | POA: Diagnosis not present

## 2018-09-02 DIAGNOSIS — I509 Heart failure, unspecified: Secondary | ICD-10-CM | POA: Diagnosis not present

## 2018-09-02 DIAGNOSIS — I25119 Atherosclerotic heart disease of native coronary artery with unspecified angina pectoris: Secondary | ICD-10-CM | POA: Diagnosis not present

## 2018-09-02 DIAGNOSIS — I350 Nonrheumatic aortic (valve) stenosis: Secondary | ICD-10-CM | POA: Diagnosis not present

## 2018-09-02 NOTE — Telephone Encounter (Signed)
Can we look into this on Monday.

## 2018-09-02 NOTE — Telephone Encounter (Signed)
New Message   [name] with Hospice and Pallative care is calling to obtain signed orders for a patient to have a plan of care dated 08/09. As well as a supplemental order for wound care dated 08/23

## 2018-09-06 LAB — CUP PACEART REMOTE DEVICE CHECK
Battery Voltage: 2.74 V
Brady Statistic AP VP Percent: 99 %
Brady Statistic AP VS Percent: 1 %
Brady Statistic AS VP Percent: 1 %
Brady Statistic RA Percent Paced: 99 %
Brady Statistic RV Percent Paced: 99 %
Date Time Interrogation Session: 20190828072539
Implantable Lead Implant Date: 20110705
Implantable Lead Implant Date: 20110705
Implantable Lead Location: 753859
Implantable Lead Location: 753860
Lead Channel Impedance Value: 330 Ohm
Lead Channel Pacing Threshold Amplitude: 0.75 V
Lead Channel Pacing Threshold Amplitude: 0.875 V
Lead Channel Pacing Threshold Pulse Width: 0.5 ms
Lead Channel Sensing Intrinsic Amplitude: 4.8 mV
Lead Channel Setting Pacing Amplitude: 1.75 V
Lead Channel Setting Pacing Pulse Width: 0.5 ms
MDC IDC MSMT BATTERY REMAINING LONGEVITY: 14 mo
MDC IDC MSMT BATTERY REMAINING PERCENTAGE: 15 %
MDC IDC MSMT LEADCHNL RA IMPEDANCE VALUE: 400 Ohm
MDC IDC MSMT LEADCHNL RA SENSING INTR AMPL: 2.9 mV
MDC IDC MSMT LEADCHNL RV PACING THRESHOLD PULSEWIDTH: 0.5 ms
MDC IDC PG IMPLANT DT: 20110705
MDC IDC SET LEADCHNL RV PACING AMPLITUDE: 1.125
MDC IDC SET LEADCHNL RV SENSING SENSITIVITY: 4 mV
MDC IDC STAT BRADY AS VS PERCENT: 1 %
Pulse Gen Model: 2210
Pulse Gen Serial Number: 7152830

## 2018-09-06 NOTE — Telephone Encounter (Signed)
Going forward shavonne from hospice/medical records is going to call and fax directly to [place].

## 2018-09-06 NOTE — Telephone Encounter (Signed)
lvm for shavonne to call back from medical records for hospice and palliative care.

## 2018-09-07 ENCOUNTER — Other Ambulatory Visit: Payer: Self-pay

## 2018-09-07 DIAGNOSIS — E1159 Type 2 diabetes mellitus with other circulatory complications: Secondary | ICD-10-CM | POA: Diagnosis not present

## 2018-09-07 DIAGNOSIS — N183 Chronic kidney disease, stage 3 (moderate): Secondary | ICD-10-CM | POA: Diagnosis not present

## 2018-09-07 DIAGNOSIS — I350 Nonrheumatic aortic (valve) stenosis: Secondary | ICD-10-CM | POA: Diagnosis not present

## 2018-09-07 DIAGNOSIS — E669 Obesity, unspecified: Secondary | ICD-10-CM | POA: Diagnosis not present

## 2018-09-07 DIAGNOSIS — G619 Inflammatory polyneuropathy, unspecified: Secondary | ICD-10-CM | POA: Diagnosis not present

## 2018-09-07 DIAGNOSIS — K219 Gastro-esophageal reflux disease without esophagitis: Secondary | ICD-10-CM | POA: Diagnosis not present

## 2018-09-07 DIAGNOSIS — I509 Heart failure, unspecified: Secondary | ICD-10-CM | POA: Diagnosis not present

## 2018-09-07 DIAGNOSIS — I4891 Unspecified atrial fibrillation: Secondary | ICD-10-CM | POA: Diagnosis not present

## 2018-09-07 DIAGNOSIS — J301 Allergic rhinitis due to pollen: Secondary | ICD-10-CM | POA: Diagnosis not present

## 2018-09-07 DIAGNOSIS — D649 Anemia, unspecified: Secondary | ICD-10-CM | POA: Diagnosis not present

## 2018-09-07 DIAGNOSIS — G4733 Obstructive sleep apnea (adult) (pediatric): Secondary | ICD-10-CM | POA: Diagnosis not present

## 2018-09-07 DIAGNOSIS — J449 Chronic obstructive pulmonary disease, unspecified: Secondary | ICD-10-CM | POA: Diagnosis not present

## 2018-09-07 DIAGNOSIS — H409 Unspecified glaucoma: Secondary | ICD-10-CM | POA: Diagnosis not present

## 2018-09-07 DIAGNOSIS — E039 Hypothyroidism, unspecified: Secondary | ICD-10-CM | POA: Diagnosis not present

## 2018-09-07 DIAGNOSIS — I25119 Atherosclerotic heart disease of native coronary artery with unspecified angina pectoris: Secondary | ICD-10-CM | POA: Diagnosis not present

## 2018-09-07 MED ORDER — ACCU-CHEK SOFT TOUCH LANCETS MISC: 12 refills | Status: AC

## 2018-09-07 MED ORDER — GLUCOSE BLOOD VI STRP: ORAL_STRIP | 12 refills | Status: AC

## 2018-09-07 MED ORDER — ACCU-CHEK AVIVA CONNECT W/DEVICE KIT: 1.0000 | PACK | Freq: Two times a day (BID) | 0 refills | Status: AC

## 2018-09-07 NOTE — Telephone Encounter (Signed)
S/w Shavonne from Hospice went through all paperwork that has already been faxed.  Needed another signature and will deal directly with Shavonne from medical records.  Phone # is [phone] and fax numbers are [phone] and [phone]. Faxed paperwork this am.

## 2018-09-08 DIAGNOSIS — E669 Obesity, unspecified: Secondary | ICD-10-CM | POA: Diagnosis not present

## 2018-09-08 DIAGNOSIS — I509 Heart failure, unspecified: Secondary | ICD-10-CM | POA: Diagnosis not present

## 2018-09-08 DIAGNOSIS — E1159 Type 2 diabetes mellitus with other circulatory complications: Secondary | ICD-10-CM | POA: Diagnosis not present

## 2018-09-08 DIAGNOSIS — I25119 Atherosclerotic heart disease of native coronary artery with unspecified angina pectoris: Secondary | ICD-10-CM | POA: Diagnosis not present

## 2018-09-08 DIAGNOSIS — J449 Chronic obstructive pulmonary disease, unspecified: Secondary | ICD-10-CM | POA: Diagnosis not present

## 2018-09-08 DIAGNOSIS — I350 Nonrheumatic aortic (valve) stenosis: Secondary | ICD-10-CM | POA: Diagnosis not present

## 2018-09-10 ENCOUNTER — Telehealth: Payer: Self-pay

## 2018-09-10 NOTE — Telephone Encounter (Signed)
Received fax from [facility] about giving the OK to switch pt from the [facility] because it is no longer available. Filled out form and placed on Dr Rosario Adie desk to sign. No stamps accepted.

## 2018-09-14 ENCOUNTER — Encounter: Payer: Self-pay | Admitting: Primary Care

## 2018-09-14 ENCOUNTER — Ambulatory Visit (INDEPENDENT_AMBULATORY_CARE_PROVIDER_SITE_OTHER): Payer: Medicare Other | Admitting: Primary Care

## 2018-09-14 DIAGNOSIS — I5032 Chronic diastolic (congestive) heart failure: Secondary | ICD-10-CM

## 2018-09-14 DIAGNOSIS — E119 Type 2 diabetes mellitus without complications: Secondary | ICD-10-CM | POA: Diagnosis not present

## 2018-09-14 DIAGNOSIS — N183 Chronic kidney disease, stage 3 unspecified: Secondary | ICD-10-CM

## 2018-09-14 DIAGNOSIS — H401133 Primary open-angle glaucoma, bilateral, severe stage: Secondary | ICD-10-CM | POA: Diagnosis not present

## 2018-09-14 DIAGNOSIS — IMO0001 Reserved for inherently not codable concepts without codable children: Secondary | ICD-10-CM

## 2018-09-14 DIAGNOSIS — H9190 Unspecified hearing loss, unspecified ear: Secondary | ICD-10-CM

## 2018-09-14 DIAGNOSIS — I2510 Atherosclerotic heart disease of native coronary artery without angina pectoris: Secondary | ICD-10-CM

## 2018-09-14 DIAGNOSIS — I480 Paroxysmal atrial fibrillation: Secondary | ICD-10-CM

## 2018-09-14 DIAGNOSIS — Z794 Long term (current) use of insulin: Secondary | ICD-10-CM

## 2018-09-14 DIAGNOSIS — E7800 Pure hypercholesterolemia, unspecified: Secondary | ICD-10-CM

## 2018-09-14 DIAGNOSIS — J449 Chronic obstructive pulmonary disease, unspecified: Secondary | ICD-10-CM | POA: Diagnosis not present

## 2018-09-14 DIAGNOSIS — I350 Nonrheumatic aortic (valve) stenosis: Secondary | ICD-10-CM | POA: Diagnosis not present

## 2018-09-14 DIAGNOSIS — I130 Hypertensive heart and chronic kidney disease with heart failure and stage 1 through stage 4 chronic kidney disease, or unspecified chronic kidney disease: Secondary | ICD-10-CM | POA: Diagnosis not present

## 2018-09-14 DIAGNOSIS — H409 Unspecified glaucoma: Secondary | ICD-10-CM

## 2018-09-14 NOTE — Assessment & Plan Note (Signed)
Improved compared to labs from July 2019. Continue current regimen. Reminded to avoid NSAID's.

## 2018-09-14 NOTE — Patient Instructions (Signed)
Please schedule a follow up visit with Dr. [name] for diabetes management.   Follow up with Dr. Rayann Heman or Cecille Rubin as scheduled.  Please schedule a follow up appointment in 6 months with me.   It was a pleasure to meet you today! Please don't hesitate to call or message me with any questions. Welcome to [facility]!

## 2018-09-14 NOTE — Assessment & Plan Note (Signed)
Following with endocrinology and is injecting 900 units daily. Discussed for patient to schedule a follow up visit with his endocrinologist, he will do so.

## 2018-09-14 NOTE — Progress Notes (Signed)
Subjective:    Patient ID: [name], male    DOB: [birth year], 77 y.o.   MRN: [id number]  HPI  [name] is a 77 year old male who presents today to establish care and discuss the problems mentioned below. Will obtain old records.  1) Hypertension/CHF/CAD/Sick Sinus Syndrome/Aortic Stenosis: Currently managed on furosemide 40 mg daily, amiodarone 200 mg, apixaban 5 mg BID, metoprolol tartrate 25 mg BID, rosuvastatin 40 mg. He has a pacemaker in place which was placed around 2011. He is currently following with cardiology and electrophysiology. He was last evaluated by cardiology in July 2019, he was a poor candidate for TAVR procedure and given his other medication conditions felt comfort care would be best. He was referred to [facility] at that time.   He does experience exertional shortness of breath with mild activity, uses walker for support. He denies chest pain, dizziness.   2) Hypothyroidism: Currently managed on levothyroxine 75 mcg. His last TSH was 3.72 in June 2019.  3) Type 2 Diabetes: Diagnosed in 1988. Currently managed on insulin regular U-100, 150-300 units four times daily and is injecting 900 units total daily. He is following with Dr. [name] through endocrinology.   He is checking his glucose 3-4 times daily: AM fasting: 200-300 Noon: 150-180 Before dinner: 150 Bedtime: 80-120  4) Hospice: Currently followed by hospice weekly. He is using a walker at home for the most part. He is sedentary most of the day given exertional shortness of breath. He does have a history of recurrent cellulitis to the bilateral lower extremities, is visited by hospice nurse weekly who monitors. He denies fevers, erythema, open wounds.    5) Glaucoma: Currently following with ophthalmology. He is compliant to his eye drops as prescribed.   Review of Systems  Constitutional: Negative for fever.  HENT:       Very hard of hearing  Eyes:       Glaucoma   Respiratory: Positive for  shortness of breath. Negative for cough.   Cardiovascular: Negative for chest pain.  Skin: Negative for color change.  Neurological: Negative for dizziness.       Past Medical History:  Diagnosis Date  . Bladder cancer ([facility]) 2014  . COPD (chronic obstructive pulmonary disease) ([facility])    CPAP  . Depression   . Diabetes mellitus    TypeII  . Diastolic dysfunction with chronic heart failure ([facility])   . GERD (gastroesophageal reflux disease)   . H/O Legionnaire's disease 2003  . Hyperlipemia   . Hypothyroidism   . Morbid obesity ([facility])   . OSA on CPAP   . Osteoarthritis    fingers  . Pancreatitis   . Peripheral neuropathy   . Persistent atrial fibrillation   . Renal insufficiency   . Sick sinus syndrome ([facility]) 06/2010   s/p PPM by [employer]  . Venous insufficiency      Social History   Socioeconomic History  . Marital status: Married    Spouse name: Not on file  . Number of children: Not on file  . Years of education: Not on file  . Highest education level: Not on file  Occupational History  . Occupation: retired     Fish farm manager: RETIRED    Comment: Scientist, clinical (histocompatibility and immunogenetics)  . Financial resource strain: Not on file  . Food insecurity:    Worry: Not on file    Inability: Not on file  . Transportation needs:    Medical: Not on  file    Non-medical: Not on file  Tobacco Use  . Smoking status: Former Smoker    Packs/day: 2.00    Years: 50.00    Pack years: 100.00    Types: Cigarettes    Last attempt to quit: 12/08/2006    Years since quitting: 11.7  . Smokeless tobacco: Never Used  Substance and Sexual Activity  . Alcohol use: No    Alcohol/week: 0.0 standard drinks    Comment: quit 3 years ago  . Drug use: No  . Sexual activity: Not Currently  Lifestyle  . Physical activity:    Days per week: Not on file    Minutes per session: Not on file  . Stress: Not on file  Relationships  . Social connections:    Talks on phone: Not on file    Gets together: Not on  file    Attends religious service: Not on file    Active member of club or organization: Not on file    Attends meetings of clubs or organizations: Not on file    Relationship status: Not on file  . Intimate partner violence:    Fear of current or ex partner: Not on file    Emotionally abused: Not on file    Physically abused: Not on file    Forced sexual activity: Not on file  Other Topics Concern  . Not on file  Social History Narrative  . Not on file    Past Surgical History:  Procedure Laterality Date  . BELPHAROPTOSIS REPAIR Right    Glaucoma  . CARDIAC CATHETERIZATION     11-02-13  . CARDIOVERSION N/A 05/16/2014   Procedure: CARDIOVERSION;  Surgeon: Josue Hector, MD;  Location[facility]COPY;  Service: Cardiovascular;  Laterality: N/A;  . CARDIOVERSION N/A 11/08/2014   Procedure: CARDIOVERSION;  Surgeon: Fay Records, MD;  Location: [facility];  Service: Cardiovascular;  Laterality: N/A;  . CARDIOVERSION N/A 06/22/2015   Procedure: CARDIOVERSION;  Surgeon: Jerline Pain, MD;  Location: [facility];  Service: Cardiovascular;  Laterality: N/A;  . CHOLECYSTECTOMY  2012  . CYSTOSCOPY W/ URETERAL STENT PLACEMENT Right 10/26/2013   Procedure: CYSTOSCOPY WITH RETROGRADE PYELOGRAM/URETERAL STENT PLACEMENT;  Surgeon: Alexis Frock, MD;  Location: [facility];  Service: Urology;  Laterality: Right;  . CYSTOSCOPY WITH URETEROSCOPY AND STENT PLACEMENT Right 01/11/2014   Procedure: CYSTOSCOPY WITH URETEROSCOPY ,RIGHT RETROGRADE AND STENT CHANGE AND LASER OF URETERAL TUMOR;  Surgeon: Alexis Frock, MD;  Location: [facility];  Service: Urology;  Laterality: Right;  . CYSTOSCOPY/RETROGRADE/URETEROSCOPY Right 08/23/2014   Procedure: CYSTOSCOPY/RETROGRADE/ DIAGNOSTIC URETEROSCOPY/RIGHT RENAL STONE EXTRACTION;  Surgeon: Alexis Frock, MD;  Location: [facility];  Service: Urology;  Laterality: Right;  . EMBOLECTOMY Left 11/02/2013   Procedure: LEFT FEMORAL EMBOLECTOMY, LEFT FEMORAL ARTERY ENDARTERECTOMY  WITH DACRON PATCH ANGIOPLASTY.;  Surgeon: Mal Misty, MD;  Location: [facility];  Service: Vascular;  Laterality: Left;  . EYE SURGERY Left    cataract  . GROIN DEBRIDEMENT Right 05/08/2015   Procedure: REMOVAL OF RIGHT GROIN MASS;  Surgeon: Angelia Mould, MD;  Location: [facility];  Service: Vascular;  Laterality: Right;  . HOLMIUM LASER APPLICATION Right 07/10/6628   Procedure: HOLMIUM LASER APPLICATION;  Surgeon: Alexis Frock, MD;  Location: [facility];  Service: Urology;  Laterality: Right;  . INSERT / REPLACE / REMOVE PACEMAKER  2011  . NASAL SEPTUM SURGERY  1967  . PACEMAKER PLACEMENT  06/2010   [facility] Jude Medical Accent RF DR, Model [id number] ( Serial number  [id number])  . TEE WITHOUT CARDIOVERSION N/A 05/16/2014   Procedure: TRANSESOPHAGEAL ECHOCARDIOGRAM (TEE);  Surgeon: Josue Hector, MD;  [facility] ENDOSCOPY;  Service: Cardiovascular;  Laterality: N/A;  . thromboembolectomy and four compartment fasciotomy Right 2009   leg  . TRANSURETHRAL RESECTION OF BLADDER TUMOR WITH GYRUS (TURBT-GYRUS) N/A 10/26/2013   Procedure: TRANSURETHRAL RESECTION OF BLADDER TUMOR WITH GYRUS (TURBT-GYRUS);  Surgeon: Alexis Frock, MD;  Location: [facility];  Service: Urology;  Laterality: N/A;  . TRANSURETHRAL RESECTION OF BLADDER TUMOR WITH GYRUS (TURBT-GYRUS) N/A 01/11/2014   Procedure: TRANSURETHRAL RESECTION OF BLADDER TUMOR WITH GYRUS (TURBT-GYRUS);  Surgeon: Alexis Frock, MD;  Location: [facility];  Service: Urology;  Laterality: N/A;  . WISDOM TOOTH EXTRACTION      Family History  Problem Relation Age of Onset  . Liver cancer Mother        deceased age 40  . Cancer Mother        liver cancer  . Heart attack Father   . Colon cancer Neg Hx     Allergies  Allergen Reactions  . Actos [Pioglitazone] Swelling  . Timolol Other (See Comments)    Slow heart rate but tolerates Cosopt (dorzolamide-timolol) Pt has a rx for Timolol and uses it for glaucoma   . Ceftriaxone Rash    Current Outpatient Medications  on File Prior to Visit  Medication Sig Dispense Refill  . acetaminophen (TYLENOL) 500 MG tablet Take 1,000 mg by mouth 2 (two) times daily as needed (pain/headache).     Marland Kitchen albuterol (VENTOLIN HFA) 108 (90 Base) MCG/ACT inhaler Inhale 2 puffs into the lungs every 6 (six) hours as needed for wheezing or shortness of breath. 18 Inhaler 1  . amiodarone (PACERONE) 200 MG tablet Take 1 tablet (200 mg total) by mouth daily. 90 tablet 3  . apixaban (ELIQUIS) 5 MG TABS tablet Take 1 tablet (5 mg total) by mouth 2 (two) times daily. 180 tablet 3  . Ascorbic Acid (VITAMIN C) 500 MG tablet Take 500 mg by mouth daily.     . Blood Glucose Monitoring Suppl (ACCU-CHEK AVIVA CONNECT) w/Device KIT 1 Package by Does not apply route 2 (two) times daily. 1 kit 0  . brimonidine (ALPHAGAN P) 0.1 % SOLN Place 1 drop into the left eye 3 (three) times daily.     . cholecalciferol (VITAMIN D) 1000 UNITS tablet Take 1,000 Units by mouth daily.     Marland Kitchen CINNAMON PO Take 2,000 mg by mouth daily.    Marland Kitchen dextromethorphan (DELSYM) 30 MG/5ML liquid Take 90 mg by mouth 2 (two) times daily as needed for cough.    . Digestive Enzymes (MULTI-ENZYME) TABS Take 1 tablet by mouth daily.    Marland Kitchen docusate sodium (COLACE) 100 MG capsule Take 100-200 mg by mouth daily as needed for mild constipation.     . dorzolamide (TRUSOPT) 2 % ophthalmic solution Place 1 drop into both eyes 2 (two) times daily.    . fluticasone (FLONASE) 50 MCG/ACT nasal spray Place 2 sprays into both nostrils as needed (congestion).     . furosemide (LASIX) 40 MG tablet Take 1 tablet (40 mg total) by mouth 2 (two) times daily. 180 tablet 3  . glucose blood test strip Use as instructed to check blood sugar twice daily E11.69 100 each 12  . guaiFENesin 200 MG tablet Take 400 mg by mouth 2 (two) times daily. 44m in the morning and 6057min the evening     . hydrOXYzine (ATARAX/VISTARIL) 25 MG tablet  TAKE 1 TABLET BY MOUTH 3 TIMES A DAY AS NEEDED FOR ITCHING 30 tablet 0  .  insulin regular (NOVOLIN R) 100 units/mL injection INJECT 4 TIMES A DAY AS DIRECTED JUST BEFORE EACH MEAL, 300-210-210-190 UNITS 840 mL 2  . Lactobacillus (ACIDOPHILUS) CAPS capsule Take 1 capsule by mouth every evening.     . Lancets (ACCU-CHEK SOFT TOUCH) lancets Use as instructed to check blood sugar twice daily E11.69 100 each 12  . levothyroxine (SYNTHROID, LEVOTHROID) 75 MCG tablet Take 1 tablet (75 mcg total) by mouth daily before breakfast. 90 tablet 3  . metoprolol tartrate (LOPRESSOR) 25 MG tablet Take 1 tablet (25 mg total) by mouth 2 (two) times daily. 180 tablet 3  . Multiple Vitamin (MULTIVITAMIN WITH MINERALS) TABS tablet Take 1 tablet by mouth every evening.     . Omega-3 Fatty Acids (FISH OIL) 1000 MG CAPS Take 1,000 mg by mouth daily.     Marland Kitchen omeprazole (PRILOSEC) 40 MG capsule Take 1 capsule (40 mg total) by mouth daily. 90 capsule 3  . Resveratrol 100 MG CAPS Take 100 mg by mouth as needed.     . rosuvastatin (CRESTOR) 40 MG tablet Take 0.5 tablets (20 mg total) by mouth every evening. 45 tablet 3  . Timolol Maleate 0.5 % (DAILY) SOLN Place 1 drop into both eyes 2 (two) times daily.    . travoprost, benzalkonium, (TRAVATAN) 0.004 % ophthalmic solution Place 1 drop into the left eye at bedtime.     No current facility-administered medications on file prior to visit.     BP 120/66   Pulse 80   Temp 97.7 F (36.5 C) (Oral)   Ht _0  (1.778 m)   Wt (!) 303 lb 8 oz (137.7 kg)   SpO2 95%   BMI 43.55 kg/m    Objective:   Physical Exam  Constitutional: He is oriented to person, place, and time. He appears well-nourished.  HENT:  Very hard of hearing  Neck: Neck supple.  Cardiovascular: Normal rate and regular rhythm.  Murmur heard. Respiratory: Effort normal and breath sounds normal. He has no wheezes.  Neurological: He is alert and oriented to person, place, and time.  Skin: Skin is warm and dry.  Scabbing to left anterior lower extremity. No erythema, no drainage.             Assessment & Plan:

## 2018-09-14 NOTE — Assessment & Plan Note (Signed)
Significant loss, difficulty hearing discussion during today's visit.

## 2018-09-14 NOTE — Assessment & Plan Note (Signed)
Following with ophthalmology, continue current regimen. 

## 2018-09-14 NOTE — Assessment & Plan Note (Signed)
Complaint to rosuvastatin, continue same.

## 2018-09-14 NOTE — Assessment & Plan Note (Signed)
Following with cardiology, poor candidate for TAVR procedure.

## 2018-09-14 NOTE — Assessment & Plan Note (Signed)
BP stable in the office today. Continue current regimen.

## 2018-09-14 NOTE — Assessment & Plan Note (Signed)
Continue atorvastatin. Following with cardiology.

## 2018-09-14 NOTE — Assessment & Plan Note (Signed)
Rate and rhythm regular today, continue beta blocker and apixaban. Pacemaker in place.

## 2018-09-14 NOTE — Assessment & Plan Note (Signed)
Not currently on controller inhaler, infrequent use of albuterol. Lungs clear. Following with pulmonology.

## 2018-09-14 NOTE — Assessment & Plan Note (Signed)
Following with cardiology and electrophysiology. Overall appears euvolemic today. Continue beta blocker and furosemide. Pacemaker in place.

## 2018-09-15 ENCOUNTER — Telehealth: Payer: Self-pay | Admitting: Endocrinology

## 2018-09-15 DIAGNOSIS — E669 Obesity, unspecified: Secondary | ICD-10-CM | POA: Diagnosis not present

## 2018-09-15 DIAGNOSIS — I350 Nonrheumatic aortic (valve) stenosis: Secondary | ICD-10-CM | POA: Diagnosis not present

## 2018-09-15 DIAGNOSIS — E1159 Type 2 diabetes mellitus with other circulatory complications: Secondary | ICD-10-CM | POA: Diagnosis not present

## 2018-09-15 DIAGNOSIS — I25119 Atherosclerotic heart disease of native coronary artery with unspecified angina pectoris: Secondary | ICD-10-CM | POA: Diagnosis not present

## 2018-09-15 DIAGNOSIS — J449 Chronic obstructive pulmonary disease, unspecified: Secondary | ICD-10-CM | POA: Diagnosis not present

## 2018-09-15 DIAGNOSIS — I509 Heart failure, unspecified: Secondary | ICD-10-CM | POA: Diagnosis not present

## 2018-09-15 NOTE — Telephone Encounter (Signed)
Please advise 

## 2018-09-15 NOTE — Telephone Encounter (Signed)
novolin R and humulin R are interchangeable

## 2018-09-15 NOTE — Telephone Encounter (Signed)
Jessica/[facility] called to let Dr.Ellison know the pt was taking Novolin R, but they only have Insulin R on the shelf so wanted to confirm if it was okay to switch.

## 2018-09-16 ENCOUNTER — Other Ambulatory Visit: Payer: Self-pay

## 2018-09-16 ENCOUNTER — Telehealth: Payer: Self-pay | Admitting: Nurse Practitioner

## 2018-09-16 MED ORDER — INSULIN LISPRO 100 UNIT/ML ~~LOC~~ SOLN: SUBCUTANEOUS | 2 refills | Status: DC

## 2018-09-16 MED ORDER — INSULIN REGULAR HUMAN 100 UNIT/ML IJ SOLN: INTRAMUSCULAR | 2 refills | Status: DC

## 2018-09-16 NOTE — Telephone Encounter (Signed)
Can you please take care of.

## 2018-09-16 NOTE — Telephone Encounter (Signed)
New Message          Jessica with [facility] is needing a verbal  Of continuation of care for patient. Pls call and advise.

## 2018-09-16 NOTE — Telephone Encounter (Signed)
lft vm for [name] to return call

## 2018-09-16 NOTE — Telephone Encounter (Signed)
Spoke to [name] and sent rx for humulin to pt pharmacy in place of novolin

## 2018-09-17 NOTE — Telephone Encounter (Signed)
S/w Sherri the triage nurse at [phone] to give verbal order for continued care of Hospice on pt per [facility].

## 2018-09-21 NOTE — Telephone Encounter (Signed)
Faxing continue care to hospice for pt @ [phone] phone # is [phone].

## 2018-09-22 DIAGNOSIS — E669 Obesity, unspecified: Secondary | ICD-10-CM | POA: Diagnosis not present

## 2018-09-22 DIAGNOSIS — J449 Chronic obstructive pulmonary disease, unspecified: Secondary | ICD-10-CM | POA: Diagnosis not present

## 2018-09-22 DIAGNOSIS — I509 Heart failure, unspecified: Secondary | ICD-10-CM | POA: Diagnosis not present

## 2018-09-22 DIAGNOSIS — I25119 Atherosclerotic heart disease of native coronary artery with unspecified angina pectoris: Secondary | ICD-10-CM | POA: Diagnosis not present

## 2018-09-22 DIAGNOSIS — I350 Nonrheumatic aortic (valve) stenosis: Secondary | ICD-10-CM | POA: Diagnosis not present

## 2018-09-22 DIAGNOSIS — E1159 Type 2 diabetes mellitus with other circulatory complications: Secondary | ICD-10-CM | POA: Diagnosis not present

## 2018-09-24 ENCOUNTER — Telehealth: Payer: Self-pay | Admitting: Endocrinology

## 2018-09-24 ENCOUNTER — Encounter: Payer: Self-pay | Admitting: Endocrinology

## 2018-09-24 ENCOUNTER — Ambulatory Visit (INDEPENDENT_AMBULATORY_CARE_PROVIDER_SITE_OTHER): Admitting: Endocrinology

## 2018-09-24 ENCOUNTER — Telehealth: Payer: Self-pay | Admitting: Primary Care

## 2018-09-24 VITALS — BP 124/82 | HR 74 | Ht 70.0 in | Wt 305.0 lb

## 2018-09-24 DIAGNOSIS — E1159 Type 2 diabetes mellitus with other circulatory complications: Secondary | ICD-10-CM | POA: Diagnosis not present

## 2018-09-24 DIAGNOSIS — I350 Nonrheumatic aortic (valve) stenosis: Secondary | ICD-10-CM | POA: Diagnosis not present

## 2018-09-24 DIAGNOSIS — I509 Heart failure, unspecified: Secondary | ICD-10-CM | POA: Diagnosis not present

## 2018-09-24 DIAGNOSIS — E669 Obesity, unspecified: Secondary | ICD-10-CM

## 2018-09-24 DIAGNOSIS — J449 Chronic obstructive pulmonary disease, unspecified: Secondary | ICD-10-CM | POA: Diagnosis not present

## 2018-09-24 DIAGNOSIS — E1169 Type 2 diabetes mellitus with other specified complication: Secondary | ICD-10-CM

## 2018-09-24 DIAGNOSIS — I25119 Atherosclerotic heart disease of native coronary artery with unspecified angina pectoris: Secondary | ICD-10-CM | POA: Diagnosis not present

## 2018-09-24 LAB — POCT GLYCOSYLATED HEMOGLOBIN (HGB A1C): Hemoglobin A1C: 7.9 % — AB (ref 4.0–5.6)

## 2018-09-24 MED ORDER — INSULIN REGULAR HUMAN 100 UNIT/ML IJ SOLN: INTRAMUSCULAR | 3 refills | Status: AC

## 2018-09-24 MED ORDER — DOXYCYCLINE HYCLATE 100 MG PO TABS: 100.0000 mg | ORAL_TABLET | Freq: Two times a day (BID) | ORAL | 0 refills | Status: DC

## 2018-09-24 NOTE — Telephone Encounter (Signed)
Spoke to [place] and informed her that he had an appt today and it was sent to pt pharmacy

## 2018-09-24 NOTE — Telephone Encounter (Signed)
Clarification given to pharmacist on relion that it is more cost effective for it to be written the way it was for pt

## 2018-09-24 NOTE — Telephone Encounter (Signed)
Please advise 

## 2018-09-24 NOTE — Telephone Encounter (Signed)
Please call [facility] concerning RX fill. Ph # [phone] Station [phone]

## 2018-09-24 NOTE — Progress Notes (Signed)
Subjective:    Patient ID: [name], male    DOB: 1941-10-30, 77 y.o.   MRN: [id number]  HPI Pt returns for f/u of diabetes mellitus:  DM type: Insulin-requiring type 2.  Dx'ed: 1988.  Complications: renal insufficiency, polyneuropathy, and CAD.   Therapy: insulin since 1989.  DKA: never.   Severe hypoglycemia: never.   Pancreatitis: several episodes, most recently in 2013.   Other: he takes multiple daily injections.  He takes U-100 reg, rather than U-500, due to cost.   He brings a record of his cbg's which I have reviewed today.  It varies from 44-440.  It is still in general lowest at HS, but he has cbg's below 100 at all times of day.  pt states he feels well in general.   Pt reports slight pain at the right middle finger Past Medical History:  Diagnosis Date  . Bladder cancer ([place]) 2014  . COPD (chronic obstructive pulmonary disease) ([facility])    CPAP  . Depression   . Diabetes mellitus    TypeII  . Diastolic dysfunction with chronic heart failure ([place])   . GERD (gastroesophageal reflux disease)   . H/O Legionnaire's disease 2003  . Hyperlipemia   . Hypothyroidism   . Morbid obesity ([place])   . OSA on CPAP   . Osteoarthritis    fingers  . Pancreatitis   . Peripheral neuropathy   . Persistent atrial fibrillation   . Renal insufficiency   . Sick sinus syndrome ([place]) 06/2010   s/p PPM by [employer]  . Venous insufficiency     Past Surgical History:  Procedure Laterality Date  . BELPHAROPTOSIS REPAIR Right    Glaucoma  . CARDIAC CATHETERIZATION     11-02-13  . CARDIOVERSION N/A 05/16/2014   Procedure: CARDIOVERSION;  Surgeon: Josue Hector, MD;  Location: [facility];  Service: Cardiovascular;  Laterality: N/A;  . CARDIOVERSION N/A 11/08/2014   Procedure: CARDIOVERSION;  Surgeon: Fay Records, MD;  Location: [facility];  Service: Cardiovascular;  Laterality: N/A;  . CARDIOVERSION N/A 06/22/2015   Procedure: CARDIOVERSION;  Surgeon: Jerline Pain, MD;  Location: [place] [facility];  Service: Cardiovascular;  Laterality: N/A;  . CHOLECYSTECTOMY  2012  . CYSTOSCOPY W/ URETERAL STENT PLACEMENT Right 10/26/2013   Procedure: CYSTOSCOPY WITH RETROGRADE PYELOGRAM/URETERAL STENT PLACEMENT;  Surgeon: Alexis Frock, MD;  [facility];  Service: Urology;  Laterality: Right;  . CYSTOSCOPY WITH URETEROSCOPY AND STENT PLACEMENT Right 01/11/2014   Procedure: CYSTOSCOPY WITH URETEROSCOPY ,RIGHT RETROGRADE AND STENT CHANGE AND LASER OF URETERAL TUMOR;  Surgeon: Alexis Frock, MD;  [facility];  Service: Urology;  Laterality: Right;  . CYSTOSCOPY/RETROGRADE/URETEROSCOPY Right 08/23/2014   Procedure: CYSTOSCOPY/RETROGRADE/ DIAGNOSTIC URETEROSCOPY/RIGHT RENAL STONE EXTRACTION;  Surgeon: Alexis Frock, MD;  [facility];  Service: Urology;  Laterality: Right;  . EMBOLECTOMY Left 11/02/2013   Procedure: LEFT FEMORAL EMBOLECTOMY, LEFT FEMORAL ARTERY ENDARTERECTOMY WITH DACRON PATCH ANGIOPLASTY.;  Surgeon: Mal Misty, MD;  Location: [facility];  Service: Vascular;  Laterality: Left;  . EYE SURGERY Left    cataract  . GROIN DEBRIDEMENT Right 05/08/2015   Procedure: REMOVAL OF RIGHT GROIN MASS;  Surgeon: Angelia Mould, MD;  Location: [facility];  Service: Vascular;  Laterality: Right;  . HOLMIUM LASER APPLICATION Right 04/10/6502   Procedure: HOLMIUM LASER APPLICATION;  Surgeon: Alexis Frock, MD;  [facility];  Service: Urology;  Laterality: Right;  . INSERT / REPLACE / REMOVE PACEMAKER  2011  . NASAL SEPTUM  SURGERY  1967  . PACEMAKER PLACEMENT  06/2010   [facility] Accent RF DR, Model [id number] ( Serial number [id number])  . TEE WITHOUT CARDIOVERSION N/A 05/16/2014   Procedure: TRANSESOPHAGEAL ECHOCARDIOGRAM (TEE);  Surgeon: Josue Hector, MD;  Location: [facility];  Service: Cardiovascular;  Laterality: N/A;  . thromboembolectomy and four compartment fasciotomy Right 2009   leg  . TRANSURETHRAL RESECTION OF BLADDER TUMOR WITH GYRUS (TURBT-GYRUS) N/A 10/26/2013    Procedure: TRANSURETHRAL RESECTION OF BLADDER TUMOR WITH GYRUS (TURBT-GYRUS);  Surgeon: Alexis Frock, MD;  [facility];  Service: Urology;  Laterality: N/A;  . TRANSURETHRAL RESECTION OF BLADDER TUMOR WITH GYRUS (TURBT-GYRUS) N/A 01/11/2014   Procedure: TRANSURETHRAL RESECTION OF BLADDER TUMOR WITH GYRUS (TURBT-GYRUS);  Surgeon: Alexis Frock, MD;  [facility];  Service: Urology;  Laterality: N/A;  . WISDOM TOOTH EXTRACTION      Social History   Socioeconomic History  . Marital status: Married    Spouse name: Not on file  . Number of children: Not on file  . Years of education: Not on file  . Highest education level: Not on file  Occupational History  . Occupation: retired     Fish farm manager: RETIRED    Comment: Scientist, clinical (histocompatibility and immunogenetics)  . Financial resource strain: Not on file  . Food insecurity:    Worry: Not on file    Inability: Not on file  . Transportation needs:    Medical: Not on file    Non-medical: Not on file  Tobacco Use  . Smoking status: Former Smoker    Packs/day: 2.00    Years: 50.00    Pack years: 100.00    Types: Cigarettes    Last attempt to quit: 12/08/2006    Years since quitting: 11.8  . Smokeless tobacco: Never Used  Substance and Sexual Activity  . Alcohol use: No    Alcohol/week: 0.0 standard drinks    Comment: quit 3 years ago  . Drug use: No  . Sexual activity: Not Currently  Lifestyle  . Physical activity:    Days per week: Not on file    Minutes per session: Not on file  . Stress: Not on file  Relationships  . Social connections:    Talks on phone: Not on file    Gets together: Not on file    Attends religious service: Not on file    Active member of club or organization: Not on file    Attends meetings of clubs or organizations: Not on file    Relationship status: Not on file  . Intimate partner violence:    Fear of current or ex partner: Not on file    Emotionally abused: Not on file    Physically abused: Not on file     Forced sexual activity: Not on file  Other Topics Concern  . Not on file  Social History Narrative  . Not on file    Current Outpatient Medications on File Prior to Visit  Medication Sig Dispense Refill  . acetaminophen (TYLENOL) 500 MG tablet Take 1,000 mg by mouth 2 (two) times daily as needed (pain/headache).     Marland Kitchen albuterol (VENTOLIN HFA) 108 (90 Base) MCG/ACT inhaler Inhale 2 puffs into the lungs every 6 (six) hours as needed for wheezing or shortness of breath. 18 Inhaler 1  . amiodarone (PACERONE) 200 MG tablet Take 1 tablet (200 mg total) by mouth daily. 90 tablet 3  . apixaban (ELIQUIS) 5 MG TABS tablet Take 1  tablet (5 mg total) by mouth 2 (two) times daily. 180 tablet 3  . Ascorbic Acid (VITAMIN C) 500 MG tablet Take 500 mg by mouth daily.     . Blood Glucose Monitoring Suppl (ACCU-CHEK AVIVA CONNECT) w/Device KIT 1 Package by Does not apply route 2 (two) times daily. 1 kit 0  . brimonidine (ALPHAGAN P) 0.1 % SOLN Place 1 drop into the left eye 3 (three) times daily.     . cholecalciferol (VITAMIN D) 1000 UNITS tablet Take 1,000 Units by mouth daily.     Marland Kitchen CINNAMON PO Take 2,000 mg by mouth daily.    Marland Kitchen dextromethorphan (DELSYM) 30 MG/5ML liquid Take 90 mg by mouth 2 (two) times daily as needed for cough.    . Digestive Enzymes (MULTI-ENZYME) TABS Take 1 tablet by mouth daily.    Marland Kitchen docusate sodium (COLACE) 100 MG capsule Take 100-200 mg by mouth daily as needed for mild constipation.     . dorzolamide (TRUSOPT) 2 % ophthalmic solution Place 1 drop into both eyes 2 (two) times daily.    . fluticasone (FLONASE) 50 MCG/ACT nasal spray Place 2 sprays into both nostrils as needed (congestion).     . furosemide (LASIX) 40 MG tablet Take 1 tablet (40 mg total) by mouth 2 (two) times daily. 180 tablet 3  . glucose blood test strip Use as instructed to check blood sugar twice daily E11.69 100 each 12  . guaiFENesin 200 MG tablet Take 400 mg by mouth 2 (two) times daily. '400mg'$  in the morning  and '600mg'$  in the evening     . hydrOXYzine (ATARAX/VISTARIL) 25 MG tablet TAKE 1 TABLET BY MOUTH 3 TIMES A DAY AS NEEDED FOR ITCHING 30 tablet 0  . Lactobacillus (ACIDOPHILUS) CAPS capsule Take 1 capsule by mouth every evening.     . Lancets (ACCU-CHEK SOFT TOUCH) lancets Use as instructed to check blood sugar twice daily E11.69 100 each 12  . levothyroxine (SYNTHROID, LEVOTHROID) 75 MCG tablet Take 1 tablet (75 mcg total) by mouth daily before breakfast. 90 tablet 3  . metoprolol tartrate (LOPRESSOR) 25 MG tablet Take 1 tablet (25 mg total) by mouth 2 (two) times daily. 180 tablet 3  . Multiple Vitamin (MULTIVITAMIN WITH MINERALS) TABS tablet Take 1 tablet by mouth every evening.     . Omega-3 Fatty Acids (FISH OIL) 1000 MG CAPS Take 1,000 mg by mouth daily.     Marland Kitchen omeprazole (PRILOSEC) 40 MG capsule Take 1 capsule (40 mg total) by mouth daily. 90 capsule 3  . Resveratrol 100 MG CAPS Take 100 mg by mouth as needed.     . rosuvastatin (CRESTOR) 40 MG tablet Take 0.5 tablets (20 mg total) by mouth every evening. 45 tablet 3  . Timolol Maleate 0.5 % (DAILY) SOLN Place 1 drop into both eyes 2 (two) times daily.    . travoprost, benzalkonium, (TRAVATAN) 0.004 % ophthalmic solution Place 1 drop into the left eye at bedtime.     No current facility-administered medications on file prior to visit.     Allergies  Allergen Reactions  . Actos [Pioglitazone] Swelling  . Timolol Other (See Comments)    Slow heart rate but tolerates Cosopt (dorzolamide-timolol) Pt has a rx for Timolol and uses it for glaucoma   . Ceftriaxone Rash    Family History  Problem Relation Age of Onset  . Liver cancer Mother        deceased age 54  . Cancer Mother  liver cancer  . Heart attack Father   . Colon cancer Neg Hx     BP 124/82 (BP Location: Left Arm, Patient Position: Sitting, Cuff Size: Large)   Pulse 74   Ht '5\' 10"'$  (1.778 m)   Wt (!) 305 lb (138.3 kg)   SpO2 95%   BMI 43.76 kg/m    Review of  Systems Denies fever and LOC    Objective:   Physical Exam VITAL SIGNS:  See vs page GENERAL: no distress Pulses: dorsalis pedis absent bilat (prob due to edema).  MSK: no deformity of the feet or ankles.   CV: 1+ bilat edema of the legs.   Skin:  No leg ulcer, but the skin is dry.  normal color and temp on the feet and ankles. Several healed surgical scars at the right leg.  Mild chronic erythema of both legs Neuro: sensation is intact to touch on the feet and ankles, but severely decreased from normal Ext: There is severe bilateral onychomycosis of the toenails. Right middle finger: slight erythema and swelling distal to the DIP   Lab Results  Component Value Date   HGBA1C 7.9 (A) 09/24/2018       Assessment & Plan:  Insulin-requiring type 2 DM, with renal insuff: unchanged from other recent visits Hypoglycemia: he should reduce insulin Poor prognosis: he is not a candidate for aggressive glycemic control. Paronychial infection, new.    Patient Instructions  please reduce the insulin to 4 times daily before meals 300-210-210-190 units I have sent a prescription to your pharmacy, for an antibiotic pill.   check your blood sugar twice a day.  vary the time of day when you check, between before the 3 meals, and at bedtime.  also check if you have symptoms of your blood sugar being too high or too low.  please keep a record of the readings and bring it to your next appointment here (or you can bring the meter itself).  You can write it on any piece of paper.  please call us sooner if your blood sugar goes below 70, or if you have a lot of readings over 200.  Please come back for a follow-up appointment in 3 months.

## 2018-09-24 NOTE — Patient Instructions (Addendum)
please reduce the insulin to 4 times daily before meals 300-210-210-190 units I have sent a prescription to your pharmacy, for an antibiotic pill.   check your blood sugar twice a day.  vary the time of day when you check, between before the 3 meals, and at bedtime.  also check if you have symptoms of your blood sugar being too high or too low.  please keep a record of the readings and bring it to your next appointment here (or you can bring the meter itself).  You can write it on any piece of paper.  please call us sooner if your blood sugar goes below 70, or if you have a lot of readings over 200.  Please come back for a follow-up appointment in 3 months.

## 2018-09-24 NOTE — Telephone Encounter (Signed)
Per [facility] "Jessica w/ [facility] calling to follow up on a call she made Wednesday re: insulin. Ph: [phone]

## 2018-09-24 NOTE — Telephone Encounter (Signed)
Janett Billow @ hospice called [facility] number [phone] They are looking for approval from dr [name] To switch insulin to relion from [facility].  They are checking to see if this is more cost effective for pt.  Please advise

## 2018-09-29 DIAGNOSIS — I25119 Atherosclerotic heart disease of native coronary artery with unspecified angina pectoris: Secondary | ICD-10-CM | POA: Diagnosis not present

## 2018-09-29 DIAGNOSIS — E669 Obesity, unspecified: Secondary | ICD-10-CM | POA: Diagnosis not present

## 2018-09-29 DIAGNOSIS — I509 Heart failure, unspecified: Secondary | ICD-10-CM | POA: Diagnosis not present

## 2018-09-29 DIAGNOSIS — E1159 Type 2 diabetes mellitus with other circulatory complications: Secondary | ICD-10-CM | POA: Diagnosis not present

## 2018-09-29 DIAGNOSIS — I350 Nonrheumatic aortic (valve) stenosis: Secondary | ICD-10-CM | POA: Diagnosis not present

## 2018-09-29 DIAGNOSIS — J449 Chronic obstructive pulmonary disease, unspecified: Secondary | ICD-10-CM | POA: Diagnosis not present

## 2018-10-04 ENCOUNTER — Other Ambulatory Visit: Payer: Self-pay | Admitting: Nurse Practitioner

## 2018-10-05 NOTE — Telephone Encounter (Signed)
Eliquis 5mg  refill request received; pt is 77 yrs old, wt-138.3kg, Crea-1.62 on 06/22/18, last seen by Truitt Merle on 07/07/18; will send in refill to requested to pharmacy.

## 2018-10-06 ENCOUNTER — Other Ambulatory Visit: Payer: Self-pay | Admitting: *Deleted

## 2018-10-06 DIAGNOSIS — E669 Obesity, unspecified: Secondary | ICD-10-CM | POA: Diagnosis not present

## 2018-10-06 DIAGNOSIS — I25119 Atherosclerotic heart disease of native coronary artery with unspecified angina pectoris: Secondary | ICD-10-CM | POA: Diagnosis not present

## 2018-10-06 DIAGNOSIS — I509 Heart failure, unspecified: Secondary | ICD-10-CM | POA: Diagnosis not present

## 2018-10-06 DIAGNOSIS — E1159 Type 2 diabetes mellitus with other circulatory complications: Secondary | ICD-10-CM | POA: Diagnosis not present

## 2018-10-06 DIAGNOSIS — I350 Nonrheumatic aortic (valve) stenosis: Secondary | ICD-10-CM | POA: Diagnosis not present

## 2018-10-06 DIAGNOSIS — J449 Chronic obstructive pulmonary disease, unspecified: Secondary | ICD-10-CM | POA: Diagnosis not present

## 2018-10-06 MED ORDER — FUROSEMIDE 40 MG PO TABS: 40.0000 mg | ORAL_TABLET | Freq: Two times a day (BID) | ORAL | 7 refills | Status: DC

## 2018-10-06 MED ORDER — METOPROLOL TARTRATE 25 MG PO TABS: 25.0000 mg | ORAL_TABLET | Freq: Two times a day (BID) | ORAL | 7 refills | Status: DC

## 2018-10-08 DIAGNOSIS — G619 Inflammatory polyneuropathy, unspecified: Secondary | ICD-10-CM | POA: Diagnosis not present

## 2018-10-08 DIAGNOSIS — E669 Obesity, unspecified: Secondary | ICD-10-CM | POA: Diagnosis not present

## 2018-10-08 DIAGNOSIS — I25119 Atherosclerotic heart disease of native coronary artery with unspecified angina pectoris: Secondary | ICD-10-CM | POA: Diagnosis not present

## 2018-10-08 DIAGNOSIS — I350 Nonrheumatic aortic (valve) stenosis: Secondary | ICD-10-CM | POA: Diagnosis not present

## 2018-10-08 DIAGNOSIS — E1159 Type 2 diabetes mellitus with other circulatory complications: Secondary | ICD-10-CM | POA: Diagnosis not present

## 2018-10-08 DIAGNOSIS — K219 Gastro-esophageal reflux disease without esophagitis: Secondary | ICD-10-CM | POA: Diagnosis not present

## 2018-10-08 DIAGNOSIS — E039 Hypothyroidism, unspecified: Secondary | ICD-10-CM | POA: Diagnosis not present

## 2018-10-08 DIAGNOSIS — J301 Allergic rhinitis due to pollen: Secondary | ICD-10-CM | POA: Diagnosis not present

## 2018-10-08 DIAGNOSIS — D649 Anemia, unspecified: Secondary | ICD-10-CM | POA: Diagnosis not present

## 2018-10-08 DIAGNOSIS — H409 Unspecified glaucoma: Secondary | ICD-10-CM | POA: Diagnosis not present

## 2018-10-08 DIAGNOSIS — G4733 Obstructive sleep apnea (adult) (pediatric): Secondary | ICD-10-CM | POA: Diagnosis not present

## 2018-10-08 DIAGNOSIS — N183 Chronic kidney disease, stage 3 (moderate): Secondary | ICD-10-CM | POA: Diagnosis not present

## 2018-10-08 DIAGNOSIS — J449 Chronic obstructive pulmonary disease, unspecified: Secondary | ICD-10-CM | POA: Diagnosis not present

## 2018-10-08 DIAGNOSIS — I509 Heart failure, unspecified: Secondary | ICD-10-CM | POA: Diagnosis not present

## 2018-10-08 DIAGNOSIS — I4891 Unspecified atrial fibrillation: Secondary | ICD-10-CM | POA: Diagnosis not present

## 2018-10-13 DIAGNOSIS — J449 Chronic obstructive pulmonary disease, unspecified: Secondary | ICD-10-CM | POA: Diagnosis not present

## 2018-10-13 DIAGNOSIS — I509 Heart failure, unspecified: Secondary | ICD-10-CM | POA: Diagnosis not present

## 2018-10-13 DIAGNOSIS — I350 Nonrheumatic aortic (valve) stenosis: Secondary | ICD-10-CM | POA: Diagnosis not present

## 2018-10-13 DIAGNOSIS — E669 Obesity, unspecified: Secondary | ICD-10-CM | POA: Diagnosis not present

## 2018-10-13 DIAGNOSIS — I25119 Atherosclerotic heart disease of native coronary artery with unspecified angina pectoris: Secondary | ICD-10-CM | POA: Diagnosis not present

## 2018-10-13 DIAGNOSIS — E1159 Type 2 diabetes mellitus with other circulatory complications: Secondary | ICD-10-CM | POA: Diagnosis not present

## 2018-10-18 DIAGNOSIS — I25119 Atherosclerotic heart disease of native coronary artery with unspecified angina pectoris: Secondary | ICD-10-CM | POA: Diagnosis not present

## 2018-10-18 DIAGNOSIS — E1159 Type 2 diabetes mellitus with other circulatory complications: Secondary | ICD-10-CM | POA: Diagnosis not present

## 2018-10-18 DIAGNOSIS — J449 Chronic obstructive pulmonary disease, unspecified: Secondary | ICD-10-CM | POA: Diagnosis not present

## 2018-10-18 DIAGNOSIS — I350 Nonrheumatic aortic (valve) stenosis: Secondary | ICD-10-CM | POA: Diagnosis not present

## 2018-10-18 DIAGNOSIS — E669 Obesity, unspecified: Secondary | ICD-10-CM | POA: Diagnosis not present

## 2018-10-18 DIAGNOSIS — I509 Heart failure, unspecified: Secondary | ICD-10-CM | POA: Diagnosis not present

## 2018-10-20 ENCOUNTER — Telehealth: Payer: Self-pay | Admitting: Nurse Practitioner

## 2018-10-20 DIAGNOSIS — J449 Chronic obstructive pulmonary disease, unspecified: Secondary | ICD-10-CM | POA: Diagnosis not present

## 2018-10-20 DIAGNOSIS — E669 Obesity, unspecified: Secondary | ICD-10-CM | POA: Diagnosis not present

## 2018-10-20 DIAGNOSIS — I350 Nonrheumatic aortic (valve) stenosis: Secondary | ICD-10-CM | POA: Diagnosis not present

## 2018-10-20 DIAGNOSIS — I25119 Atherosclerotic heart disease of native coronary artery with unspecified angina pectoris: Secondary | ICD-10-CM | POA: Diagnosis not present

## 2018-10-20 DIAGNOSIS — I509 Heart failure, unspecified: Secondary | ICD-10-CM | POA: Diagnosis not present

## 2018-10-20 DIAGNOSIS — E1159 Type 2 diabetes mellitus with other circulatory complications: Secondary | ICD-10-CM | POA: Diagnosis not present

## 2018-10-20 NOTE — Telephone Encounter (Signed)
New message  Pt c/o medication issue:  1. Name of Medication: nitroglycerin  2. How are you currently taking this medication (dosage and times per day)? n/a  3. Are you having a reaction (difficulty breathing--STAT)?n/a  4. What is your medication issue? Per Janett Billow patient had an episode on 10/18/18(pain in chest that radiated through his shoulders) and she wants to see if Truitt Merle can prescribe Nitroglycerin in case this were to happen in the future. Please advise.

## 2018-10-22 ENCOUNTER — Other Ambulatory Visit: Payer: Self-pay | Admitting: *Deleted

## 2018-10-22 MED ORDER — NITROGLYCERIN 0.4 MG SL SUBL: 0.4000 mg | SUBLINGUAL_TABLET | SUBLINGUAL | 2 refills | Status: DC | PRN

## 2018-10-22 NOTE — Telephone Encounter (Signed)
S/w Truitt Merle, NP and stated it was ok to send pt in a script for nitro.  S/w Janett Billow from hospice and confirmed pt's pharmacy.  Sent in today.

## 2018-10-27 DIAGNOSIS — I25119 Atherosclerotic heart disease of native coronary artery with unspecified angina pectoris: Secondary | ICD-10-CM | POA: Diagnosis not present

## 2018-10-27 DIAGNOSIS — E669 Obesity, unspecified: Secondary | ICD-10-CM | POA: Diagnosis not present

## 2018-10-27 DIAGNOSIS — I509 Heart failure, unspecified: Secondary | ICD-10-CM | POA: Diagnosis not present

## 2018-10-27 DIAGNOSIS — I350 Nonrheumatic aortic (valve) stenosis: Secondary | ICD-10-CM | POA: Diagnosis not present

## 2018-10-27 DIAGNOSIS — E1159 Type 2 diabetes mellitus with other circulatory complications: Secondary | ICD-10-CM | POA: Diagnosis not present

## 2018-10-27 DIAGNOSIS — J449 Chronic obstructive pulmonary disease, unspecified: Secondary | ICD-10-CM | POA: Diagnosis not present

## 2018-11-03 ENCOUNTER — Ambulatory Visit (INDEPENDENT_AMBULATORY_CARE_PROVIDER_SITE_OTHER): Payer: Medicare Other

## 2018-11-03 DIAGNOSIS — I495 Sick sinus syndrome: Secondary | ICD-10-CM | POA: Diagnosis not present

## 2018-11-03 DIAGNOSIS — J449 Chronic obstructive pulmonary disease, unspecified: Secondary | ICD-10-CM | POA: Diagnosis not present

## 2018-11-03 DIAGNOSIS — E1159 Type 2 diabetes mellitus with other circulatory complications: Secondary | ICD-10-CM | POA: Diagnosis not present

## 2018-11-03 DIAGNOSIS — I25119 Atherosclerotic heart disease of native coronary artery with unspecified angina pectoris: Secondary | ICD-10-CM | POA: Diagnosis not present

## 2018-11-03 DIAGNOSIS — I350 Nonrheumatic aortic (valve) stenosis: Secondary | ICD-10-CM | POA: Diagnosis not present

## 2018-11-03 DIAGNOSIS — I509 Heart failure, unspecified: Secondary | ICD-10-CM | POA: Diagnosis not present

## 2018-11-03 DIAGNOSIS — E669 Obesity, unspecified: Secondary | ICD-10-CM | POA: Diagnosis not present

## 2018-11-03 DIAGNOSIS — R001 Bradycardia, unspecified: Secondary | ICD-10-CM

## 2018-11-03 NOTE — Progress Notes (Signed)
Remote pacemaker transmission.   

## 2018-11-07 DIAGNOSIS — K219 Gastro-esophageal reflux disease without esophagitis: Secondary | ICD-10-CM | POA: Diagnosis not present

## 2018-11-07 DIAGNOSIS — D649 Anemia, unspecified: Secondary | ICD-10-CM | POA: Diagnosis not present

## 2018-11-07 DIAGNOSIS — E669 Obesity, unspecified: Secondary | ICD-10-CM | POA: Diagnosis not present

## 2018-11-07 DIAGNOSIS — E1159 Type 2 diabetes mellitus with other circulatory complications: Secondary | ICD-10-CM | POA: Diagnosis not present

## 2018-11-07 DIAGNOSIS — H409 Unspecified glaucoma: Secondary | ICD-10-CM | POA: Diagnosis not present

## 2018-11-07 DIAGNOSIS — J449 Chronic obstructive pulmonary disease, unspecified: Secondary | ICD-10-CM | POA: Diagnosis not present

## 2018-11-07 DIAGNOSIS — I4891 Unspecified atrial fibrillation: Secondary | ICD-10-CM | POA: Diagnosis not present

## 2018-11-07 DIAGNOSIS — I509 Heart failure, unspecified: Secondary | ICD-10-CM | POA: Diagnosis not present

## 2018-11-07 DIAGNOSIS — I25119 Atherosclerotic heart disease of native coronary artery with unspecified angina pectoris: Secondary | ICD-10-CM | POA: Diagnosis not present

## 2018-11-07 DIAGNOSIS — E039 Hypothyroidism, unspecified: Secondary | ICD-10-CM | POA: Diagnosis not present

## 2018-11-07 DIAGNOSIS — J301 Allergic rhinitis due to pollen: Secondary | ICD-10-CM | POA: Diagnosis not present

## 2018-11-07 DIAGNOSIS — G4733 Obstructive sleep apnea (adult) (pediatric): Secondary | ICD-10-CM | POA: Diagnosis not present

## 2018-11-07 DIAGNOSIS — N183 Chronic kidney disease, stage 3 (moderate): Secondary | ICD-10-CM | POA: Diagnosis not present

## 2018-11-07 DIAGNOSIS — I350 Nonrheumatic aortic (valve) stenosis: Secondary | ICD-10-CM | POA: Diagnosis not present

## 2018-11-07 DIAGNOSIS — G619 Inflammatory polyneuropathy, unspecified: Secondary | ICD-10-CM | POA: Diagnosis not present

## 2018-11-10 ENCOUNTER — Ambulatory Visit: Payer: Medicare HMO | Admitting: Nurse Practitioner

## 2018-11-10 DIAGNOSIS — J449 Chronic obstructive pulmonary disease, unspecified: Secondary | ICD-10-CM | POA: Diagnosis not present

## 2018-11-10 DIAGNOSIS — E669 Obesity, unspecified: Secondary | ICD-10-CM | POA: Diagnosis not present

## 2018-11-10 DIAGNOSIS — E1159 Type 2 diabetes mellitus with other circulatory complications: Secondary | ICD-10-CM | POA: Diagnosis not present

## 2018-11-10 DIAGNOSIS — I509 Heart failure, unspecified: Secondary | ICD-10-CM | POA: Diagnosis not present

## 2018-11-10 DIAGNOSIS — I350 Nonrheumatic aortic (valve) stenosis: Secondary | ICD-10-CM | POA: Diagnosis not present

## 2018-11-10 DIAGNOSIS — I25119 Atherosclerotic heart disease of native coronary artery with unspecified angina pectoris: Secondary | ICD-10-CM | POA: Diagnosis not present

## 2018-11-11 ENCOUNTER — Other Ambulatory Visit: Payer: Self-pay

## 2018-11-11 DIAGNOSIS — N183 Chronic kidney disease, stage 3 unspecified: Secondary | ICD-10-CM

## 2018-11-11 DIAGNOSIS — Z794 Long term (current) use of insulin: Secondary | ICD-10-CM

## 2018-11-11 DIAGNOSIS — D509 Iron deficiency anemia, unspecified: Secondary | ICD-10-CM

## 2018-11-11 DIAGNOSIS — E7800 Pure hypercholesterolemia, unspecified: Secondary | ICD-10-CM

## 2018-11-11 DIAGNOSIS — E1122 Type 2 diabetes mellitus with diabetic chronic kidney disease: Secondary | ICD-10-CM

## 2018-11-11 DIAGNOSIS — R0600 Dyspnea, unspecified: Secondary | ICD-10-CM

## 2018-11-11 MED ORDER — LEVOTHYROXINE SODIUM 75 MCG PO TABS: 75.0000 ug | ORAL_TABLET | Freq: Every day | ORAL | 0 refills | Status: DC

## 2018-11-12 ENCOUNTER — Telehealth: Payer: Self-pay | Admitting: Endocrinology

## 2018-11-12 DIAGNOSIS — I25119 Atherosclerotic heart disease of native coronary artery with unspecified angina pectoris: Secondary | ICD-10-CM | POA: Diagnosis not present

## 2018-11-12 DIAGNOSIS — I509 Heart failure, unspecified: Secondary | ICD-10-CM | POA: Diagnosis not present

## 2018-11-12 DIAGNOSIS — E1159 Type 2 diabetes mellitus with other circulatory complications: Secondary | ICD-10-CM | POA: Diagnosis not present

## 2018-11-12 DIAGNOSIS — J449 Chronic obstructive pulmonary disease, unspecified: Secondary | ICD-10-CM | POA: Diagnosis not present

## 2018-11-12 DIAGNOSIS — E669 Obesity, unspecified: Secondary | ICD-10-CM | POA: Diagnosis not present

## 2018-11-12 DIAGNOSIS — I350 Nonrheumatic aortic (valve) stenosis: Secondary | ICD-10-CM | POA: Diagnosis not present

## 2018-11-12 NOTE — Telephone Encounter (Signed)
Per Donnie Aho, Hospice/Palliaitve nurse , she needs a call back to discuss diabetes maintenance during this time for the patient.  Please call ASAP   Donnie Aho (Other) [phone]

## 2018-11-12 NOTE — Telephone Encounter (Signed)
Called Janett Billow and informed her of Dr. [name] response. States she will discuss with pt to determine if he can tolerate an OV. Will call back either today or Monday with pt response.

## 2018-11-12 NOTE — Telephone Encounter (Signed)
If pt can continue to travel here, he has the option of receiving DM care here or from hospice.  If he cannot travel here, I won't be able to manage DM.

## 2018-11-12 NOTE — Telephone Encounter (Signed)
Returned Jessica's call. States she and the hospice MD does not feel the high dose of Novolin R Relion (900+ units per day) is the best management of pt diabetes. Cost is most defintintely a concern for hospice as well. Asked if Janett Billow could provide me with CBG readings to determine if this is the best management of pt diabetes. Will further discuss with Dr. [name] as well to determine if appt is needed (if pt can even tolerate an OV). Janett Billow was not able to provide readings. States she will need to gather these readings from the pt and call me back. Will await her call to ensure Dr. [name] has all information needed to make an appropriate decision re: management of pt diabetes. Message has been routed to Dr. [name] to keep him aware of this concern.

## 2018-11-16 ENCOUNTER — Telehealth: Payer: Self-pay | Admitting: Primary Care

## 2018-11-16 ENCOUNTER — Telehealth: Payer: Self-pay | Admitting: Endocrinology

## 2018-11-16 DIAGNOSIS — E7800 Pure hypercholesterolemia, unspecified: Secondary | ICD-10-CM

## 2018-11-16 MED ORDER — ROSUVASTATIN CALCIUM 40 MG PO TABS: 20.0000 mg | ORAL_TABLET | Freq: Every evening | ORAL | 1 refills | Status: DC

## 2018-11-16 NOTE — Telephone Encounter (Signed)
I spoke to dr just now.  Please keep f/u appt next week

## 2018-11-16 NOTE — Telephone Encounter (Signed)
No recent lipid panel on file.  Will repeat at his upcoming visit.  Refill sent to pharmacy as requested.

## 2018-11-16 NOTE — Telephone Encounter (Signed)
Pt's wife called office stating the pt needs a refill on the rosuvastatin sent to [facility]. They sent over the request as well.

## 2018-11-16 NOTE — Telephone Encounter (Signed)
Last prescribed on 01/27/2018 by Dr [name] Last office visit on 09/14/2018

## 2018-11-16 NOTE — Telephone Encounter (Signed)
° ° °  Phone- [phone] Janett Billow

## 2018-11-16 NOTE — Telephone Encounter (Signed)
Please advise what the sliding scale order is for this pt. I will be happy to return Jessica's call with this information.

## 2018-11-16 NOTE — Telephone Encounter (Signed)
Jessica with Hospice care called about patient. She stated she is needing the patients sliding scale ./ She felt like this was urgent.    PHONE Janett Billow [phone]

## 2018-11-17 DIAGNOSIS — I25119 Atherosclerotic heart disease of native coronary artery with unspecified angina pectoris: Secondary | ICD-10-CM | POA: Diagnosis not present

## 2018-11-17 DIAGNOSIS — J449 Chronic obstructive pulmonary disease, unspecified: Secondary | ICD-10-CM | POA: Diagnosis not present

## 2018-11-17 DIAGNOSIS — E669 Obesity, unspecified: Secondary | ICD-10-CM | POA: Diagnosis not present

## 2018-11-17 DIAGNOSIS — I350 Nonrheumatic aortic (valve) stenosis: Secondary | ICD-10-CM | POA: Diagnosis not present

## 2018-11-17 DIAGNOSIS — I509 Heart failure, unspecified: Secondary | ICD-10-CM | POA: Diagnosis not present

## 2018-11-17 DIAGNOSIS — E1159 Type 2 diabetes mellitus with other circulatory complications: Secondary | ICD-10-CM | POA: Diagnosis not present

## 2018-11-18 ENCOUNTER — Telehealth: Payer: Self-pay | Admitting: Endocrinology

## 2018-11-18 NOTE — Telephone Encounter (Signed)
Janett Billow from [facility] calling in reference to this patient. Is requesting a call back at [phone]

## 2018-11-18 NOTE — Telephone Encounter (Signed)
If pt cannot travel here, can hospice take over care of her DM?

## 2018-11-18 NOTE — Telephone Encounter (Signed)
Spoke to [place] and she stated that pt has declined in health and that he does not believe that he will be able to make it into the office for upcoming appt She also stated that pt has been experiencing some lows and feeling weaker.Marland Kitchen She wanted to know if she sent in [facility] records and if pt had an A1C done at home would you be willing to adjust pt insulin dose. I asked her to please fax CBG record and that I would notify you with a message, please advise.

## 2018-11-19 ENCOUNTER — Telehealth: Payer: Self-pay | Admitting: *Deleted

## 2018-11-19 NOTE — Telephone Encounter (Signed)
Called Janett Billow and made her aware. States she will discuss with pt to ensure he IS unable to travel. If so, hospice will assume care of DM.

## 2018-11-19 NOTE — Telephone Encounter (Signed)
Received a fax from hospice that needs to be urgently signed. Provider is out of office today and will fax over Monday # [phone] phone 3 is [phone].

## 2018-11-22 ENCOUNTER — Ambulatory Visit: Payer: Medicare HMO | Admitting: Endocrinology

## 2018-11-22 DIAGNOSIS — E669 Obesity, unspecified: Secondary | ICD-10-CM | POA: Diagnosis not present

## 2018-11-22 DIAGNOSIS — J449 Chronic obstructive pulmonary disease, unspecified: Secondary | ICD-10-CM | POA: Diagnosis not present

## 2018-11-22 DIAGNOSIS — I350 Nonrheumatic aortic (valve) stenosis: Secondary | ICD-10-CM | POA: Diagnosis not present

## 2018-11-22 DIAGNOSIS — I509 Heart failure, unspecified: Secondary | ICD-10-CM | POA: Diagnosis not present

## 2018-11-22 DIAGNOSIS — I25119 Atherosclerotic heart disease of native coronary artery with unspecified angina pectoris: Secondary | ICD-10-CM | POA: Diagnosis not present

## 2018-11-22 DIAGNOSIS — E1159 Type 2 diabetes mellitus with other circulatory complications: Secondary | ICD-10-CM | POA: Diagnosis not present

## 2018-11-22 NOTE — Telephone Encounter (Signed)
Faxing request to hospice

## 2018-11-23 ENCOUNTER — Telehealth: Payer: Self-pay | Admitting: Nurse Practitioner

## 2018-11-23 NOTE — Telephone Encounter (Signed)
S/w [name] at [place] revoked pt due to not being able to make appt's anymore. Hospice will keep giving pt insulin according to dose in chart, Cecille Rubin agreed.  When pt's sugar starts decreasing will call to get insulin changed or stopped, Cecille Rubin agreed.  Pt still is able to manage checking sugar each day.  Hospice comes in weekly and will due so till further decline.

## 2018-11-23 NOTE — Telephone Encounter (Signed)
New Message        Jessica with [facility] is calling because she needs to discuss the patient's diabetes manangement, patient is declining and will no longer see Dr Renato Shin. Pls call and advise.

## 2018-11-29 DIAGNOSIS — I25119 Atherosclerotic heart disease of native coronary artery with unspecified angina pectoris: Secondary | ICD-10-CM | POA: Diagnosis not present

## 2018-11-29 DIAGNOSIS — I509 Heart failure, unspecified: Secondary | ICD-10-CM | POA: Diagnosis not present

## 2018-11-29 DIAGNOSIS — E669 Obesity, unspecified: Secondary | ICD-10-CM | POA: Diagnosis not present

## 2018-11-29 DIAGNOSIS — I350 Nonrheumatic aortic (valve) stenosis: Secondary | ICD-10-CM | POA: Diagnosis not present

## 2018-11-29 DIAGNOSIS — J449 Chronic obstructive pulmonary disease, unspecified: Secondary | ICD-10-CM | POA: Diagnosis not present

## 2018-11-29 DIAGNOSIS — E1159 Type 2 diabetes mellitus with other circulatory complications: Secondary | ICD-10-CM | POA: Diagnosis not present

## 2018-12-07 DIAGNOSIS — I509 Heart failure, unspecified: Secondary | ICD-10-CM | POA: Diagnosis not present

## 2018-12-07 DIAGNOSIS — I350 Nonrheumatic aortic (valve) stenosis: Secondary | ICD-10-CM | POA: Diagnosis not present

## 2018-12-07 DIAGNOSIS — J449 Chronic obstructive pulmonary disease, unspecified: Secondary | ICD-10-CM | POA: Diagnosis not present

## 2018-12-07 DIAGNOSIS — I25119 Atherosclerotic heart disease of native coronary artery with unspecified angina pectoris: Secondary | ICD-10-CM | POA: Diagnosis not present

## 2018-12-07 DIAGNOSIS — E669 Obesity, unspecified: Secondary | ICD-10-CM | POA: Diagnosis not present

## 2018-12-07 DIAGNOSIS — E1159 Type 2 diabetes mellitus with other circulatory complications: Secondary | ICD-10-CM | POA: Diagnosis not present

## 2018-12-08 DIAGNOSIS — I25119 Atherosclerotic heart disease of native coronary artery with unspecified angina pectoris: Secondary | ICD-10-CM | POA: Diagnosis not present

## 2018-12-08 DIAGNOSIS — I509 Heart failure, unspecified: Secondary | ICD-10-CM | POA: Diagnosis not present

## 2018-12-08 DIAGNOSIS — J301 Allergic rhinitis due to pollen: Secondary | ICD-10-CM | POA: Diagnosis not present

## 2018-12-08 DIAGNOSIS — K219 Gastro-esophageal reflux disease without esophagitis: Secondary | ICD-10-CM | POA: Diagnosis not present

## 2018-12-08 DIAGNOSIS — E039 Hypothyroidism, unspecified: Secondary | ICD-10-CM | POA: Diagnosis not present

## 2018-12-08 DIAGNOSIS — G4733 Obstructive sleep apnea (adult) (pediatric): Secondary | ICD-10-CM | POA: Diagnosis not present

## 2018-12-08 DIAGNOSIS — D649 Anemia, unspecified: Secondary | ICD-10-CM | POA: Diagnosis not present

## 2018-12-08 DIAGNOSIS — E1159 Type 2 diabetes mellitus with other circulatory complications: Secondary | ICD-10-CM | POA: Diagnosis not present

## 2018-12-08 DIAGNOSIS — E669 Obesity, unspecified: Secondary | ICD-10-CM | POA: Diagnosis not present

## 2018-12-08 DIAGNOSIS — H409 Unspecified glaucoma: Secondary | ICD-10-CM | POA: Diagnosis not present

## 2018-12-08 DIAGNOSIS — J449 Chronic obstructive pulmonary disease, unspecified: Secondary | ICD-10-CM | POA: Diagnosis not present

## 2018-12-08 DIAGNOSIS — I4891 Unspecified atrial fibrillation: Secondary | ICD-10-CM | POA: Diagnosis not present

## 2018-12-08 DIAGNOSIS — G619 Inflammatory polyneuropathy, unspecified: Secondary | ICD-10-CM | POA: Diagnosis not present

## 2018-12-08 DIAGNOSIS — N183 Chronic kidney disease, stage 3 (moderate): Secondary | ICD-10-CM | POA: Diagnosis not present

## 2018-12-08 DIAGNOSIS — I350 Nonrheumatic aortic (valve) stenosis: Secondary | ICD-10-CM | POA: Diagnosis not present

## 2018-12-13 DIAGNOSIS — E669 Obesity, unspecified: Secondary | ICD-10-CM | POA: Diagnosis not present

## 2018-12-13 DIAGNOSIS — I350 Nonrheumatic aortic (valve) stenosis: Secondary | ICD-10-CM | POA: Diagnosis not present

## 2018-12-13 DIAGNOSIS — E1159 Type 2 diabetes mellitus with other circulatory complications: Secondary | ICD-10-CM | POA: Diagnosis not present

## 2018-12-13 DIAGNOSIS — I509 Heart failure, unspecified: Secondary | ICD-10-CM | POA: Diagnosis not present

## 2018-12-13 DIAGNOSIS — I25119 Atherosclerotic heart disease of native coronary artery with unspecified angina pectoris: Secondary | ICD-10-CM | POA: Diagnosis not present

## 2018-12-13 DIAGNOSIS — J449 Chronic obstructive pulmonary disease, unspecified: Secondary | ICD-10-CM | POA: Diagnosis not present

## 2018-12-15 DIAGNOSIS — H401133 Primary open-angle glaucoma, bilateral, severe stage: Secondary | ICD-10-CM | POA: Diagnosis not present

## 2018-12-17 ENCOUNTER — Telehealth: Payer: Self-pay | Admitting: Endocrinology

## 2018-12-17 DIAGNOSIS — J449 Chronic obstructive pulmonary disease, unspecified: Secondary | ICD-10-CM | POA: Diagnosis not present

## 2018-12-17 DIAGNOSIS — I350 Nonrheumatic aortic (valve) stenosis: Secondary | ICD-10-CM | POA: Diagnosis not present

## 2018-12-17 DIAGNOSIS — E669 Obesity, unspecified: Secondary | ICD-10-CM | POA: Diagnosis not present

## 2018-12-17 DIAGNOSIS — I25119 Atherosclerotic heart disease of native coronary artery with unspecified angina pectoris: Secondary | ICD-10-CM | POA: Diagnosis not present

## 2018-12-17 DIAGNOSIS — E1159 Type 2 diabetes mellitus with other circulatory complications: Secondary | ICD-10-CM | POA: Diagnosis not present

## 2018-12-17 DIAGNOSIS — I509 Heart failure, unspecified: Secondary | ICD-10-CM | POA: Diagnosis not present

## 2018-12-17 NOTE — Telephone Encounter (Signed)
lft vm with instructions and call back # if she had any further questions

## 2018-12-17 NOTE — Telephone Encounter (Signed)
Please refer to new PCP

## 2018-12-17 NOTE — Telephone Encounter (Signed)
Shavonne from Hospice called to advise that they have orders dated 11/17/18 from Dr [name] that need to be signed.   Is requesting a call back at [phone] to discuss

## 2018-12-20 DIAGNOSIS — J449 Chronic obstructive pulmonary disease, unspecified: Secondary | ICD-10-CM | POA: Diagnosis not present

## 2018-12-20 DIAGNOSIS — E1159 Type 2 diabetes mellitus with other circulatory complications: Secondary | ICD-10-CM | POA: Diagnosis not present

## 2018-12-20 DIAGNOSIS — I25119 Atherosclerotic heart disease of native coronary artery with unspecified angina pectoris: Secondary | ICD-10-CM | POA: Diagnosis not present

## 2018-12-20 DIAGNOSIS — E669 Obesity, unspecified: Secondary | ICD-10-CM | POA: Diagnosis not present

## 2018-12-20 DIAGNOSIS — I509 Heart failure, unspecified: Secondary | ICD-10-CM | POA: Diagnosis not present

## 2018-12-20 DIAGNOSIS — I350 Nonrheumatic aortic (valve) stenosis: Secondary | ICD-10-CM | POA: Diagnosis not present

## 2018-12-21 ENCOUNTER — Telehealth: Payer: Self-pay | Admitting: Nurse Practitioner

## 2018-12-21 ENCOUNTER — Telehealth: Payer: Self-pay | Admitting: *Deleted

## 2018-12-21 NOTE — Telephone Encounter (Signed)
  Needs verbal order to continue pallative care for patient

## 2018-12-21 NOTE — Telephone Encounter (Signed)
Shavonne called back from med rec at hospice.  No on can sign Lori's order when Cecille Rubin is out for hip surgery.  Cecille Rubin has up to 30 days to sign orders.  Will call shavonne when Cecille Rubin is out to note in hospice chart.

## 2018-12-21 NOTE — Telephone Encounter (Signed)
Lvm for Shavonne at [place] in med rec to see if Cecille Rubin is out can another app sign hospice paperwork since Cecille Rubin has several hospice pts.

## 2018-12-22 ENCOUNTER — Ambulatory Visit: Payer: Medicare HMO | Admitting: Endocrinology

## 2018-12-22 DIAGNOSIS — I25119 Atherosclerotic heart disease of native coronary artery with unspecified angina pectoris: Secondary | ICD-10-CM | POA: Diagnosis not present

## 2018-12-22 DIAGNOSIS — E669 Obesity, unspecified: Secondary | ICD-10-CM | POA: Diagnosis not present

## 2018-12-22 DIAGNOSIS — J449 Chronic obstructive pulmonary disease, unspecified: Secondary | ICD-10-CM | POA: Diagnosis not present

## 2018-12-22 DIAGNOSIS — I350 Nonrheumatic aortic (valve) stenosis: Secondary | ICD-10-CM | POA: Diagnosis not present

## 2018-12-22 DIAGNOSIS — E1159 Type 2 diabetes mellitus with other circulatory complications: Secondary | ICD-10-CM | POA: Diagnosis not present

## 2018-12-22 DIAGNOSIS — I509 Heart failure, unspecified: Secondary | ICD-10-CM | POA: Diagnosis not present

## 2018-12-22 NOTE — Telephone Encounter (Signed)
lvm with verbal order would like to continue palliative care.  Thought pt was on hospice??  Call back if needed.

## 2018-12-24 LAB — CUP PACEART REMOTE DEVICE CHECK
Battery Remaining Longevity: 10 mo
Battery Remaining Percentage: 10 %
Battery Voltage: 2.71 V
Brady Statistic AS VS Percent: 1 %
Brady Statistic RA Percent Paced: 92 %
Date Time Interrogation Session: 20191127075956
Implantable Lead Implant Date: 20110705
Implantable Lead Location: 753859
Implantable Lead Location: 753860
Implantable Pulse Generator Implant Date: 20110705
Lead Channel Impedance Value: 300 Ohm
Lead Channel Impedance Value: 380 Ohm
Lead Channel Pacing Threshold Amplitude: 0.75 V
Lead Channel Pacing Threshold Pulse Width: 0.5 ms
Lead Channel Pacing Threshold Pulse Width: 0.5 ms
Lead Channel Sensing Intrinsic Amplitude: 2.1 mV
Lead Channel Setting Pacing Amplitude: 1 V
MDC IDC LEAD IMPLANT DT: 20110705
MDC IDC MSMT LEADCHNL RV PACING THRESHOLD AMPLITUDE: 0.75 V
MDC IDC MSMT LEADCHNL RV SENSING INTR AMPL: 4.8 mV
MDC IDC SET LEADCHNL RA PACING AMPLITUDE: 1.75 V
MDC IDC SET LEADCHNL RV PACING PULSEWIDTH: 0.5 ms
MDC IDC SET LEADCHNL RV SENSING SENSITIVITY: 4 mV
MDC IDC STAT BRADY AP VP PERCENT: 99 %
MDC IDC STAT BRADY AP VS PERCENT: 1 %
MDC IDC STAT BRADY AS VP PERCENT: 1 %
MDC IDC STAT BRADY RV PERCENT PACED: 99 %
Pulse Gen Model: 2210
Pulse Gen Serial Number: 7152830

## 2018-12-30 DIAGNOSIS — I350 Nonrheumatic aortic (valve) stenosis: Secondary | ICD-10-CM | POA: Diagnosis not present

## 2018-12-30 DIAGNOSIS — I25119 Atherosclerotic heart disease of native coronary artery with unspecified angina pectoris: Secondary | ICD-10-CM | POA: Diagnosis not present

## 2018-12-30 DIAGNOSIS — J449 Chronic obstructive pulmonary disease, unspecified: Secondary | ICD-10-CM | POA: Diagnosis not present

## 2018-12-30 DIAGNOSIS — E1159 Type 2 diabetes mellitus with other circulatory complications: Secondary | ICD-10-CM | POA: Diagnosis not present

## 2018-12-30 DIAGNOSIS — E669 Obesity, unspecified: Secondary | ICD-10-CM | POA: Diagnosis not present

## 2018-12-30 DIAGNOSIS — I509 Heart failure, unspecified: Secondary | ICD-10-CM | POA: Diagnosis not present

## 2019-01-04 DIAGNOSIS — I350 Nonrheumatic aortic (valve) stenosis: Secondary | ICD-10-CM | POA: Diagnosis not present

## 2019-01-04 DIAGNOSIS — J449 Chronic obstructive pulmonary disease, unspecified: Secondary | ICD-10-CM | POA: Diagnosis not present

## 2019-01-04 DIAGNOSIS — E1159 Type 2 diabetes mellitus with other circulatory complications: Secondary | ICD-10-CM | POA: Diagnosis not present

## 2019-01-04 DIAGNOSIS — E669 Obesity, unspecified: Secondary | ICD-10-CM | POA: Diagnosis not present

## 2019-01-04 DIAGNOSIS — I509 Heart failure, unspecified: Secondary | ICD-10-CM | POA: Diagnosis not present

## 2019-01-04 DIAGNOSIS — I25119 Atherosclerotic heart disease of native coronary artery with unspecified angina pectoris: Secondary | ICD-10-CM | POA: Diagnosis not present

## 2019-01-05 ENCOUNTER — Other Ambulatory Visit: Payer: Self-pay | Admitting: Nurse Practitioner

## 2019-01-05 ENCOUNTER — Telehealth: Payer: Self-pay | Admitting: Nurse Practitioner

## 2019-01-05 NOTE — Telephone Encounter (Signed)
Per Donnie Aho at hospice pt has significantly decrease insulin dose.  Pt's sliding scale is now 650-800 units daily.  Pt does a great job keeping data on sugars. Pt is declining but mentally sharpe.  Pt is neither hyper or hypoglycemic.  Breakfast  200 - 300 units  Lunch time 150-200 units Dinner time 150-200 units Bedtime 0-180 units      Advised with Cecille Rubin does not want to adjust at this time and will continue with Hospice plan of care per there request.

## 2019-01-05 NOTE — Telephone Encounter (Signed)
New message     Janett Billow RN wanted to update [place] [facility] with pt blood glucose levels. Please follow up

## 2019-01-08 DIAGNOSIS — G619 Inflammatory polyneuropathy, unspecified: Secondary | ICD-10-CM | POA: Diagnosis not present

## 2019-01-08 DIAGNOSIS — J449 Chronic obstructive pulmonary disease, unspecified: Secondary | ICD-10-CM | POA: Diagnosis not present

## 2019-01-08 DIAGNOSIS — E669 Obesity, unspecified: Secondary | ICD-10-CM | POA: Diagnosis not present

## 2019-01-08 DIAGNOSIS — I4891 Unspecified atrial fibrillation: Secondary | ICD-10-CM | POA: Diagnosis not present

## 2019-01-08 DIAGNOSIS — I350 Nonrheumatic aortic (valve) stenosis: Secondary | ICD-10-CM | POA: Diagnosis not present

## 2019-01-08 DIAGNOSIS — H409 Unspecified glaucoma: Secondary | ICD-10-CM | POA: Diagnosis not present

## 2019-01-08 DIAGNOSIS — N183 Chronic kidney disease, stage 3 (moderate): Secondary | ICD-10-CM | POA: Diagnosis not present

## 2019-01-08 DIAGNOSIS — K219 Gastro-esophageal reflux disease without esophagitis: Secondary | ICD-10-CM | POA: Diagnosis not present

## 2019-01-08 DIAGNOSIS — D649 Anemia, unspecified: Secondary | ICD-10-CM | POA: Diagnosis not present

## 2019-01-08 DIAGNOSIS — E1159 Type 2 diabetes mellitus with other circulatory complications: Secondary | ICD-10-CM | POA: Diagnosis not present

## 2019-01-08 DIAGNOSIS — I25119 Atherosclerotic heart disease of native coronary artery with unspecified angina pectoris: Secondary | ICD-10-CM | POA: Diagnosis not present

## 2019-01-08 DIAGNOSIS — E039 Hypothyroidism, unspecified: Secondary | ICD-10-CM | POA: Diagnosis not present

## 2019-01-08 DIAGNOSIS — G4733 Obstructive sleep apnea (adult) (pediatric): Secondary | ICD-10-CM | POA: Diagnosis not present

## 2019-01-08 DIAGNOSIS — J301 Allergic rhinitis due to pollen: Secondary | ICD-10-CM | POA: Diagnosis not present

## 2019-01-08 DIAGNOSIS — I509 Heart failure, unspecified: Secondary | ICD-10-CM | POA: Diagnosis not present

## 2019-01-11 ENCOUNTER — Telehealth: Payer: Self-pay | Admitting: Internal Medicine

## 2019-01-11 DIAGNOSIS — I25119 Atherosclerotic heart disease of native coronary artery with unspecified angina pectoris: Secondary | ICD-10-CM | POA: Diagnosis not present

## 2019-01-11 DIAGNOSIS — E1159 Type 2 diabetes mellitus with other circulatory complications: Secondary | ICD-10-CM | POA: Diagnosis not present

## 2019-01-11 DIAGNOSIS — I350 Nonrheumatic aortic (valve) stenosis: Secondary | ICD-10-CM | POA: Diagnosis not present

## 2019-01-11 DIAGNOSIS — J449 Chronic obstructive pulmonary disease, unspecified: Secondary | ICD-10-CM | POA: Diagnosis not present

## 2019-01-11 DIAGNOSIS — I509 Heart failure, unspecified: Secondary | ICD-10-CM | POA: Diagnosis not present

## 2019-01-11 DIAGNOSIS — E669 Obesity, unspecified: Secondary | ICD-10-CM | POA: Diagnosis not present

## 2019-01-11 NOTE — Telephone Encounter (Signed)
° ° °  Janett Billow from [facility] calling to report patient is under hospice care. No appointments will be needed.

## 2019-01-14 DIAGNOSIS — I509 Heart failure, unspecified: Secondary | ICD-10-CM | POA: Diagnosis not present

## 2019-01-14 DIAGNOSIS — J449 Chronic obstructive pulmonary disease, unspecified: Secondary | ICD-10-CM | POA: Diagnosis not present

## 2019-01-14 DIAGNOSIS — E1159 Type 2 diabetes mellitus with other circulatory complications: Secondary | ICD-10-CM | POA: Diagnosis not present

## 2019-01-14 DIAGNOSIS — I25119 Atherosclerotic heart disease of native coronary artery with unspecified angina pectoris: Secondary | ICD-10-CM | POA: Diagnosis not present

## 2019-01-14 DIAGNOSIS — E669 Obesity, unspecified: Secondary | ICD-10-CM | POA: Diagnosis not present

## 2019-01-14 DIAGNOSIS — I350 Nonrheumatic aortic (valve) stenosis: Secondary | ICD-10-CM | POA: Diagnosis not present

## 2019-01-18 DIAGNOSIS — J449 Chronic obstructive pulmonary disease, unspecified: Secondary | ICD-10-CM | POA: Diagnosis not present

## 2019-01-18 DIAGNOSIS — E1159 Type 2 diabetes mellitus with other circulatory complications: Secondary | ICD-10-CM | POA: Diagnosis not present

## 2019-01-18 DIAGNOSIS — I350 Nonrheumatic aortic (valve) stenosis: Secondary | ICD-10-CM | POA: Diagnosis not present

## 2019-01-18 DIAGNOSIS — E669 Obesity, unspecified: Secondary | ICD-10-CM | POA: Diagnosis not present

## 2019-01-18 DIAGNOSIS — I509 Heart failure, unspecified: Secondary | ICD-10-CM | POA: Diagnosis not present

## 2019-01-18 DIAGNOSIS — I25119 Atherosclerotic heart disease of native coronary artery with unspecified angina pectoris: Secondary | ICD-10-CM | POA: Diagnosis not present

## 2019-01-19 DIAGNOSIS — E669 Obesity, unspecified: Secondary | ICD-10-CM | POA: Diagnosis not present

## 2019-01-19 DIAGNOSIS — I509 Heart failure, unspecified: Secondary | ICD-10-CM | POA: Diagnosis not present

## 2019-01-19 DIAGNOSIS — I350 Nonrheumatic aortic (valve) stenosis: Secondary | ICD-10-CM | POA: Diagnosis not present

## 2019-01-19 DIAGNOSIS — I25119 Atherosclerotic heart disease of native coronary artery with unspecified angina pectoris: Secondary | ICD-10-CM | POA: Diagnosis not present

## 2019-01-19 DIAGNOSIS — E1159 Type 2 diabetes mellitus with other circulatory complications: Secondary | ICD-10-CM | POA: Diagnosis not present

## 2019-01-19 DIAGNOSIS — J449 Chronic obstructive pulmonary disease, unspecified: Secondary | ICD-10-CM | POA: Diagnosis not present

## 2019-01-20 ENCOUNTER — Other Ambulatory Visit: Payer: Self-pay | Admitting: Primary Care

## 2019-01-20 DIAGNOSIS — R0600 Dyspnea, unspecified: Secondary | ICD-10-CM

## 2019-01-20 DIAGNOSIS — E7800 Pure hypercholesterolemia, unspecified: Secondary | ICD-10-CM

## 2019-01-20 DIAGNOSIS — N183 Chronic kidney disease, stage 3 unspecified: Secondary | ICD-10-CM

## 2019-01-20 DIAGNOSIS — Z794 Long term (current) use of insulin: Secondary | ICD-10-CM

## 2019-01-20 DIAGNOSIS — D509 Iron deficiency anemia, unspecified: Secondary | ICD-10-CM

## 2019-01-20 DIAGNOSIS — E1122 Type 2 diabetes mellitus with diabetic chronic kidney disease: Secondary | ICD-10-CM

## 2019-01-20 NOTE — Telephone Encounter (Signed)
Have not been prescribed by Allie Bossier. Last prescribed by DR Ellsion on 11/11/2018. Last office visit on 09/14/2018. Next future appointment on 03/16/2019

## 2019-01-21 MED ORDER — LEVOTHYROXINE SODIUM 75 MCG PO TABS
ORAL_TABLET | ORAL | 0 refills | Status: DC
Start: 2019-01-21 — End: 2019-04-18

## 2019-01-21 NOTE — Telephone Encounter (Signed)
Noted, refill sent to pharmacy. 

## 2019-01-22 DIAGNOSIS — E1159 Type 2 diabetes mellitus with other circulatory complications: Secondary | ICD-10-CM | POA: Diagnosis not present

## 2019-01-22 DIAGNOSIS — I350 Nonrheumatic aortic (valve) stenosis: Secondary | ICD-10-CM | POA: Diagnosis not present

## 2019-01-22 DIAGNOSIS — J449 Chronic obstructive pulmonary disease, unspecified: Secondary | ICD-10-CM | POA: Diagnosis not present

## 2019-01-22 DIAGNOSIS — I25119 Atherosclerotic heart disease of native coronary artery with unspecified angina pectoris: Secondary | ICD-10-CM | POA: Diagnosis not present

## 2019-01-22 DIAGNOSIS — E669 Obesity, unspecified: Secondary | ICD-10-CM | POA: Diagnosis not present

## 2019-01-22 DIAGNOSIS — I509 Heart failure, unspecified: Secondary | ICD-10-CM | POA: Diagnosis not present

## 2019-01-24 DIAGNOSIS — E669 Obesity, unspecified: Secondary | ICD-10-CM | POA: Diagnosis not present

## 2019-01-24 DIAGNOSIS — J449 Chronic obstructive pulmonary disease, unspecified: Secondary | ICD-10-CM | POA: Diagnosis not present

## 2019-01-24 DIAGNOSIS — I25119 Atherosclerotic heart disease of native coronary artery with unspecified angina pectoris: Secondary | ICD-10-CM | POA: Diagnosis not present

## 2019-01-24 DIAGNOSIS — E1159 Type 2 diabetes mellitus with other circulatory complications: Secondary | ICD-10-CM | POA: Diagnosis not present

## 2019-01-24 DIAGNOSIS — I350 Nonrheumatic aortic (valve) stenosis: Secondary | ICD-10-CM | POA: Diagnosis not present

## 2019-01-24 DIAGNOSIS — I509 Heart failure, unspecified: Secondary | ICD-10-CM | POA: Diagnosis not present

## 2019-01-27 DIAGNOSIS — E669 Obesity, unspecified: Secondary | ICD-10-CM | POA: Diagnosis not present

## 2019-01-27 DIAGNOSIS — I350 Nonrheumatic aortic (valve) stenosis: Secondary | ICD-10-CM | POA: Diagnosis not present

## 2019-01-27 DIAGNOSIS — E1159 Type 2 diabetes mellitus with other circulatory complications: Secondary | ICD-10-CM | POA: Diagnosis not present

## 2019-01-27 DIAGNOSIS — I25119 Atherosclerotic heart disease of native coronary artery with unspecified angina pectoris: Secondary | ICD-10-CM | POA: Diagnosis not present

## 2019-01-27 DIAGNOSIS — I509 Heart failure, unspecified: Secondary | ICD-10-CM | POA: Diagnosis not present

## 2019-01-27 DIAGNOSIS — J449 Chronic obstructive pulmonary disease, unspecified: Secondary | ICD-10-CM | POA: Diagnosis not present

## 2019-01-28 DIAGNOSIS — J449 Chronic obstructive pulmonary disease, unspecified: Secondary | ICD-10-CM | POA: Diagnosis not present

## 2019-01-28 DIAGNOSIS — I350 Nonrheumatic aortic (valve) stenosis: Secondary | ICD-10-CM | POA: Diagnosis not present

## 2019-01-28 DIAGNOSIS — I509 Heart failure, unspecified: Secondary | ICD-10-CM | POA: Diagnosis not present

## 2019-01-28 DIAGNOSIS — E1159 Type 2 diabetes mellitus with other circulatory complications: Secondary | ICD-10-CM | POA: Diagnosis not present

## 2019-01-28 DIAGNOSIS — I25119 Atherosclerotic heart disease of native coronary artery with unspecified angina pectoris: Secondary | ICD-10-CM | POA: Diagnosis not present

## 2019-01-28 DIAGNOSIS — E669 Obesity, unspecified: Secondary | ICD-10-CM | POA: Diagnosis not present

## 2019-01-31 DIAGNOSIS — I509 Heart failure, unspecified: Secondary | ICD-10-CM | POA: Diagnosis not present

## 2019-01-31 DIAGNOSIS — I350 Nonrheumatic aortic (valve) stenosis: Secondary | ICD-10-CM | POA: Diagnosis not present

## 2019-01-31 DIAGNOSIS — J449 Chronic obstructive pulmonary disease, unspecified: Secondary | ICD-10-CM | POA: Diagnosis not present

## 2019-01-31 DIAGNOSIS — E669 Obesity, unspecified: Secondary | ICD-10-CM | POA: Diagnosis not present

## 2019-01-31 DIAGNOSIS — E1159 Type 2 diabetes mellitus with other circulatory complications: Secondary | ICD-10-CM | POA: Diagnosis not present

## 2019-01-31 DIAGNOSIS — I25119 Atherosclerotic heart disease of native coronary artery with unspecified angina pectoris: Secondary | ICD-10-CM | POA: Diagnosis not present

## 2019-02-02 ENCOUNTER — Ambulatory Visit (INDEPENDENT_AMBULATORY_CARE_PROVIDER_SITE_OTHER): Payer: Medicare Other | Admitting: *Deleted

## 2019-02-02 DIAGNOSIS — I495 Sick sinus syndrome: Secondary | ICD-10-CM | POA: Diagnosis not present

## 2019-02-04 LAB — CUP PACEART REMOTE DEVICE CHECK
Battery Voltage: 2.65 V
Brady Statistic AP VP Percent: 98 %
Brady Statistic RA Percent Paced: 88 %
Date Time Interrogation Session: 20200226083254
Implantable Lead Implant Date: 20110705
Implantable Lead Implant Date: 20110705
Implantable Lead Location: 753859
Implantable Lead Location: 753860
Lead Channel Impedance Value: 330 Ohm
Lead Channel Pacing Threshold Amplitude: 1 V
Lead Channel Pacing Threshold Pulse Width: 0.5 ms
Lead Channel Sensing Intrinsic Amplitude: 12 mV
MDC IDC MSMT BATTERY REMAINING LONGEVITY: 1 mo
MDC IDC MSMT BATTERY REMAINING PERCENTAGE: 3 %
MDC IDC MSMT LEADCHNL RA IMPEDANCE VALUE: 340 Ohm
MDC IDC MSMT LEADCHNL RA SENSING INTR AMPL: 3.2 mV
MDC IDC MSMT LEADCHNL RV PACING THRESHOLD AMPLITUDE: 1 V
MDC IDC MSMT LEADCHNL RV PACING THRESHOLD PULSEWIDTH: 0.5 ms
MDC IDC PG IMPLANT DT: 20110705
MDC IDC SET LEADCHNL RA PACING AMPLITUDE: 2 V
MDC IDC SET LEADCHNL RV PACING AMPLITUDE: 1.25 V
MDC IDC SET LEADCHNL RV PACING PULSEWIDTH: 0.5 ms
MDC IDC SET LEADCHNL RV SENSING SENSITIVITY: 4 mV
MDC IDC STAT BRADY AP VS PERCENT: 1.4 %
MDC IDC STAT BRADY AS VP PERCENT: 1 %
MDC IDC STAT BRADY AS VS PERCENT: 1 %
MDC IDC STAT BRADY RV PERCENT PACED: 99 %
Pulse Gen Model: 2210
Pulse Gen Serial Number: 7152830

## 2019-02-06 DIAGNOSIS — D649 Anemia, unspecified: Secondary | ICD-10-CM | POA: Diagnosis not present

## 2019-02-06 DIAGNOSIS — E669 Obesity, unspecified: Secondary | ICD-10-CM | POA: Diagnosis not present

## 2019-02-06 DIAGNOSIS — J449 Chronic obstructive pulmonary disease, unspecified: Secondary | ICD-10-CM | POA: Diagnosis not present

## 2019-02-06 DIAGNOSIS — J301 Allergic rhinitis due to pollen: Secondary | ICD-10-CM | POA: Diagnosis not present

## 2019-02-06 DIAGNOSIS — I509 Heart failure, unspecified: Secondary | ICD-10-CM | POA: Diagnosis not present

## 2019-02-06 DIAGNOSIS — H409 Unspecified glaucoma: Secondary | ICD-10-CM | POA: Diagnosis not present

## 2019-02-06 DIAGNOSIS — G619 Inflammatory polyneuropathy, unspecified: Secondary | ICD-10-CM | POA: Diagnosis not present

## 2019-02-06 DIAGNOSIS — E1159 Type 2 diabetes mellitus with other circulatory complications: Secondary | ICD-10-CM | POA: Diagnosis not present

## 2019-02-06 DIAGNOSIS — K219 Gastro-esophageal reflux disease without esophagitis: Secondary | ICD-10-CM | POA: Diagnosis not present

## 2019-02-06 DIAGNOSIS — G4733 Obstructive sleep apnea (adult) (pediatric): Secondary | ICD-10-CM | POA: Diagnosis not present

## 2019-02-06 DIAGNOSIS — I25119 Atherosclerotic heart disease of native coronary artery with unspecified angina pectoris: Secondary | ICD-10-CM | POA: Diagnosis not present

## 2019-02-06 DIAGNOSIS — E039 Hypothyroidism, unspecified: Secondary | ICD-10-CM | POA: Diagnosis not present

## 2019-02-06 DIAGNOSIS — N183 Chronic kidney disease, stage 3 (moderate): Secondary | ICD-10-CM | POA: Diagnosis not present

## 2019-02-06 DIAGNOSIS — I350 Nonrheumatic aortic (valve) stenosis: Secondary | ICD-10-CM | POA: Diagnosis not present

## 2019-02-06 DIAGNOSIS — I4891 Unspecified atrial fibrillation: Secondary | ICD-10-CM | POA: Diagnosis not present

## 2019-02-07 DIAGNOSIS — I509 Heart failure, unspecified: Secondary | ICD-10-CM | POA: Diagnosis not present

## 2019-02-07 DIAGNOSIS — I25119 Atherosclerotic heart disease of native coronary artery with unspecified angina pectoris: Secondary | ICD-10-CM | POA: Diagnosis not present

## 2019-02-07 DIAGNOSIS — E1159 Type 2 diabetes mellitus with other circulatory complications: Secondary | ICD-10-CM | POA: Diagnosis not present

## 2019-02-07 DIAGNOSIS — J449 Chronic obstructive pulmonary disease, unspecified: Secondary | ICD-10-CM | POA: Diagnosis not present

## 2019-02-07 DIAGNOSIS — E669 Obesity, unspecified: Secondary | ICD-10-CM | POA: Diagnosis not present

## 2019-02-07 DIAGNOSIS — I350 Nonrheumatic aortic (valve) stenosis: Secondary | ICD-10-CM | POA: Diagnosis not present

## 2019-02-09 ENCOUNTER — Encounter: Payer: Self-pay | Admitting: Cardiology

## 2019-02-09 NOTE — Progress Notes (Signed)
Remote pacemaker transmission.   

## 2019-02-11 DIAGNOSIS — I509 Heart failure, unspecified: Secondary | ICD-10-CM | POA: Diagnosis not present

## 2019-02-11 DIAGNOSIS — J449 Chronic obstructive pulmonary disease, unspecified: Secondary | ICD-10-CM | POA: Diagnosis not present

## 2019-02-11 DIAGNOSIS — I350 Nonrheumatic aortic (valve) stenosis: Secondary | ICD-10-CM | POA: Diagnosis not present

## 2019-02-11 DIAGNOSIS — E1159 Type 2 diabetes mellitus with other circulatory complications: Secondary | ICD-10-CM | POA: Diagnosis not present

## 2019-02-11 DIAGNOSIS — E669 Obesity, unspecified: Secondary | ICD-10-CM | POA: Diagnosis not present

## 2019-02-11 DIAGNOSIS — I25119 Atherosclerotic heart disease of native coronary artery with unspecified angina pectoris: Secondary | ICD-10-CM | POA: Diagnosis not present

## 2019-02-14 DIAGNOSIS — E669 Obesity, unspecified: Secondary | ICD-10-CM | POA: Diagnosis not present

## 2019-02-14 DIAGNOSIS — I509 Heart failure, unspecified: Secondary | ICD-10-CM | POA: Diagnosis not present

## 2019-02-14 DIAGNOSIS — I350 Nonrheumatic aortic (valve) stenosis: Secondary | ICD-10-CM | POA: Diagnosis not present

## 2019-02-14 DIAGNOSIS — J449 Chronic obstructive pulmonary disease, unspecified: Secondary | ICD-10-CM | POA: Diagnosis not present

## 2019-02-14 DIAGNOSIS — E1159 Type 2 diabetes mellitus with other circulatory complications: Secondary | ICD-10-CM | POA: Diagnosis not present

## 2019-02-14 DIAGNOSIS — I25119 Atherosclerotic heart disease of native coronary artery with unspecified angina pectoris: Secondary | ICD-10-CM | POA: Diagnosis not present

## 2019-02-17 DIAGNOSIS — J449 Chronic obstructive pulmonary disease, unspecified: Secondary | ICD-10-CM | POA: Diagnosis not present

## 2019-02-17 DIAGNOSIS — I350 Nonrheumatic aortic (valve) stenosis: Secondary | ICD-10-CM | POA: Diagnosis not present

## 2019-02-17 DIAGNOSIS — I509 Heart failure, unspecified: Secondary | ICD-10-CM | POA: Diagnosis not present

## 2019-02-17 DIAGNOSIS — I25119 Atherosclerotic heart disease of native coronary artery with unspecified angina pectoris: Secondary | ICD-10-CM | POA: Diagnosis not present

## 2019-02-17 DIAGNOSIS — E669 Obesity, unspecified: Secondary | ICD-10-CM | POA: Diagnosis not present

## 2019-02-17 DIAGNOSIS — E1159 Type 2 diabetes mellitus with other circulatory complications: Secondary | ICD-10-CM | POA: Diagnosis not present

## 2019-02-21 DIAGNOSIS — J449 Chronic obstructive pulmonary disease, unspecified: Secondary | ICD-10-CM | POA: Diagnosis not present

## 2019-02-21 DIAGNOSIS — I509 Heart failure, unspecified: Secondary | ICD-10-CM | POA: Diagnosis not present

## 2019-02-21 DIAGNOSIS — I25119 Atherosclerotic heart disease of native coronary artery with unspecified angina pectoris: Secondary | ICD-10-CM | POA: Diagnosis not present

## 2019-02-21 DIAGNOSIS — E669 Obesity, unspecified: Secondary | ICD-10-CM | POA: Diagnosis not present

## 2019-02-21 DIAGNOSIS — E1159 Type 2 diabetes mellitus with other circulatory complications: Secondary | ICD-10-CM | POA: Diagnosis not present

## 2019-02-21 DIAGNOSIS — I350 Nonrheumatic aortic (valve) stenosis: Secondary | ICD-10-CM | POA: Diagnosis not present

## 2019-02-28 ENCOUNTER — Telehealth: Payer: Self-pay | Admitting: Nurse Practitioner

## 2019-02-28 DIAGNOSIS — I350 Nonrheumatic aortic (valve) stenosis: Secondary | ICD-10-CM | POA: Diagnosis not present

## 2019-02-28 DIAGNOSIS — I25119 Atherosclerotic heart disease of native coronary artery with unspecified angina pectoris: Secondary | ICD-10-CM | POA: Diagnosis not present

## 2019-02-28 DIAGNOSIS — I509 Heart failure, unspecified: Secondary | ICD-10-CM | POA: Diagnosis not present

## 2019-02-28 DIAGNOSIS — E669 Obesity, unspecified: Secondary | ICD-10-CM | POA: Diagnosis not present

## 2019-02-28 DIAGNOSIS — J449 Chronic obstructive pulmonary disease, unspecified: Secondary | ICD-10-CM | POA: Diagnosis not present

## 2019-02-28 DIAGNOSIS — E1159 Type 2 diabetes mellitus with other circulatory complications: Secondary | ICD-10-CM | POA: Diagnosis not present

## 2019-02-28 NOTE — Telephone Encounter (Signed)
No problem, we can refill but: How often is he taking Tylenol? How often is he taking guaifenesin?

## 2019-02-28 NOTE — Telephone Encounter (Signed)
New Message    *STAT* If patient is at the pharmacy, call can be transferred to refill team.   1. Which medications need to be refilled? (please list name of each medication and dose if known) acetaminophen (TYLENOL) 500 MG tablet Take 1,000 mg by mouth 2 (two) times daily as needed (pain/headache).  And guaiFENesin 200 MG tablet as needed   2. Which pharmacy/location (including street and city if local pharmacy) is medication to be sent to? [id number] [name], [place] - [address]. Do they need a 30 day or 90 day supply? 90 day

## 2019-03-01 NOTE — Telephone Encounter (Signed)
Called patient this morning but unable to discuss since patient was asleep.

## 2019-03-01 NOTE — Telephone Encounter (Signed)
Patient stated for  Acetaminophen 500 mg - he takes 500 mg 4 times a day as needed for pain/headache  Guaifenesin 200 mg - he takes 600 mg BID

## 2019-03-01 NOTE — Telephone Encounter (Signed)
Does he take guaifenesin every day?  He really should not unless he really needs it. How often does he find that he needs to take Tylenol?

## 2019-03-02 NOTE — Telephone Encounter (Signed)
Yes, he takes it every day same goes for the Tylenol

## 2019-03-02 NOTE — Telephone Encounter (Signed)
Spoke with patient who endorses that he takes Tylenol 1000 mg TID for back pain and guaifenesin 600 mg BID for choric chest congestion and cough. He typically gets these medications filled through Hospice and will call their office for refills.

## 2019-03-07 ENCOUNTER — Telehealth: Payer: Self-pay | Admitting: *Deleted

## 2019-03-07 DIAGNOSIS — I509 Heart failure, unspecified: Secondary | ICD-10-CM | POA: Diagnosis not present

## 2019-03-07 DIAGNOSIS — E1159 Type 2 diabetes mellitus with other circulatory complications: Secondary | ICD-10-CM | POA: Diagnosis not present

## 2019-03-07 DIAGNOSIS — J449 Chronic obstructive pulmonary disease, unspecified: Secondary | ICD-10-CM | POA: Diagnosis not present

## 2019-03-07 DIAGNOSIS — E669 Obesity, unspecified: Secondary | ICD-10-CM | POA: Diagnosis not present

## 2019-03-07 DIAGNOSIS — I25119 Atherosclerotic heart disease of native coronary artery with unspecified angina pectoris: Secondary | ICD-10-CM | POA: Diagnosis not present

## 2019-03-07 DIAGNOSIS — I350 Nonrheumatic aortic (valve) stenosis: Secondary | ICD-10-CM | POA: Diagnosis not present

## 2019-03-07 NOTE — Telephone Encounter (Signed)
S/w Larence Penning, Evert Kohl, and [place] at [facility] @ [phone].  Wanted  To know how provider will sign orders do to being out of the office.  Truitt Merle, NP phone number was given today per Dorian Pod take care of Lori's pt's.   Also wanted a list of all providers in office.   Janan Halter, Clinical Supervisor stated to go on website, all providers will be doing VT.  Claiborne Billings is aware I s/w hospice and gave [facility] work email if any questions.

## 2019-03-09 DIAGNOSIS — H409 Unspecified glaucoma: Secondary | ICD-10-CM | POA: Diagnosis not present

## 2019-03-09 DIAGNOSIS — E039 Hypothyroidism, unspecified: Secondary | ICD-10-CM | POA: Diagnosis not present

## 2019-03-09 DIAGNOSIS — D631 Anemia in chronic kidney disease: Secondary | ICD-10-CM | POA: Diagnosis not present

## 2019-03-09 DIAGNOSIS — I4891 Unspecified atrial fibrillation: Secondary | ICD-10-CM | POA: Diagnosis not present

## 2019-03-09 DIAGNOSIS — E669 Obesity, unspecified: Secondary | ICD-10-CM | POA: Diagnosis not present

## 2019-03-09 DIAGNOSIS — E1122 Type 2 diabetes mellitus with diabetic chronic kidney disease: Secondary | ICD-10-CM | POA: Diagnosis not present

## 2019-03-09 DIAGNOSIS — K219 Gastro-esophageal reflux disease without esophagitis: Secondary | ICD-10-CM | POA: Diagnosis not present

## 2019-03-09 DIAGNOSIS — I519 Heart disease, unspecified: Secondary | ICD-10-CM | POA: Diagnosis not present

## 2019-03-09 DIAGNOSIS — J449 Chronic obstructive pulmonary disease, unspecified: Secondary | ICD-10-CM | POA: Diagnosis not present

## 2019-03-09 DIAGNOSIS — G4733 Obstructive sleep apnea (adult) (pediatric): Secondary | ICD-10-CM | POA: Diagnosis not present

## 2019-03-09 DIAGNOSIS — I25119 Atherosclerotic heart disease of native coronary artery with unspecified angina pectoris: Secondary | ICD-10-CM | POA: Diagnosis not present

## 2019-03-09 DIAGNOSIS — N183 Chronic kidney disease, stage 3 (moderate): Secondary | ICD-10-CM | POA: Diagnosis not present

## 2019-03-09 DIAGNOSIS — E1159 Type 2 diabetes mellitus with other circulatory complications: Secondary | ICD-10-CM | POA: Diagnosis not present

## 2019-03-09 DIAGNOSIS — I350 Nonrheumatic aortic (valve) stenosis: Secondary | ICD-10-CM | POA: Diagnosis not present

## 2019-03-09 DIAGNOSIS — E1142 Type 2 diabetes mellitus with diabetic polyneuropathy: Secondary | ICD-10-CM | POA: Diagnosis not present

## 2019-03-09 DIAGNOSIS — J302 Other seasonal allergic rhinitis: Secondary | ICD-10-CM | POA: Diagnosis not present

## 2019-03-14 DIAGNOSIS — I25119 Atherosclerotic heart disease of native coronary artery with unspecified angina pectoris: Secondary | ICD-10-CM | POA: Diagnosis not present

## 2019-03-14 DIAGNOSIS — I4891 Unspecified atrial fibrillation: Secondary | ICD-10-CM | POA: Diagnosis not present

## 2019-03-14 DIAGNOSIS — E1122 Type 2 diabetes mellitus with diabetic chronic kidney disease: Secondary | ICD-10-CM | POA: Diagnosis not present

## 2019-03-14 DIAGNOSIS — I519 Heart disease, unspecified: Secondary | ICD-10-CM | POA: Diagnosis not present

## 2019-03-14 DIAGNOSIS — I350 Nonrheumatic aortic (valve) stenosis: Secondary | ICD-10-CM | POA: Diagnosis not present

## 2019-03-14 DIAGNOSIS — E1159 Type 2 diabetes mellitus with other circulatory complications: Secondary | ICD-10-CM | POA: Diagnosis not present

## 2019-03-16 ENCOUNTER — Ambulatory Visit: Payer: Medicare HMO | Admitting: Primary Care

## 2019-03-21 DIAGNOSIS — I4891 Unspecified atrial fibrillation: Secondary | ICD-10-CM | POA: Diagnosis not present

## 2019-03-21 DIAGNOSIS — I25119 Atherosclerotic heart disease of native coronary artery with unspecified angina pectoris: Secondary | ICD-10-CM | POA: Diagnosis not present

## 2019-03-21 DIAGNOSIS — I519 Heart disease, unspecified: Secondary | ICD-10-CM | POA: Diagnosis not present

## 2019-03-21 DIAGNOSIS — E1122 Type 2 diabetes mellitus with diabetic chronic kidney disease: Secondary | ICD-10-CM | POA: Diagnosis not present

## 2019-03-21 DIAGNOSIS — E1159 Type 2 diabetes mellitus with other circulatory complications: Secondary | ICD-10-CM | POA: Diagnosis not present

## 2019-03-21 DIAGNOSIS — I350 Nonrheumatic aortic (valve) stenosis: Secondary | ICD-10-CM | POA: Diagnosis not present

## 2019-03-22 ENCOUNTER — Other Ambulatory Visit: Payer: Self-pay | Admitting: Primary Care

## 2019-03-22 DIAGNOSIS — E7800 Pure hypercholesterolemia, unspecified: Secondary | ICD-10-CM

## 2019-03-28 DIAGNOSIS — I350 Nonrheumatic aortic (valve) stenosis: Secondary | ICD-10-CM | POA: Diagnosis not present

## 2019-03-28 DIAGNOSIS — E1122 Type 2 diabetes mellitus with diabetic chronic kidney disease: Secondary | ICD-10-CM | POA: Diagnosis not present

## 2019-03-28 DIAGNOSIS — I25119 Atherosclerotic heart disease of native coronary artery with unspecified angina pectoris: Secondary | ICD-10-CM | POA: Diagnosis not present

## 2019-03-28 DIAGNOSIS — E1159 Type 2 diabetes mellitus with other circulatory complications: Secondary | ICD-10-CM | POA: Diagnosis not present

## 2019-03-28 DIAGNOSIS — I519 Heart disease, unspecified: Secondary | ICD-10-CM | POA: Diagnosis not present

## 2019-03-28 DIAGNOSIS — I4891 Unspecified atrial fibrillation: Secondary | ICD-10-CM | POA: Diagnosis not present

## 2019-03-29 DIAGNOSIS — I519 Heart disease, unspecified: Secondary | ICD-10-CM | POA: Diagnosis not present

## 2019-03-29 DIAGNOSIS — E1159 Type 2 diabetes mellitus with other circulatory complications: Secondary | ICD-10-CM | POA: Diagnosis not present

## 2019-03-29 DIAGNOSIS — I25119 Atherosclerotic heart disease of native coronary artery with unspecified angina pectoris: Secondary | ICD-10-CM | POA: Diagnosis not present

## 2019-03-29 DIAGNOSIS — I4891 Unspecified atrial fibrillation: Secondary | ICD-10-CM | POA: Diagnosis not present

## 2019-03-29 DIAGNOSIS — E1122 Type 2 diabetes mellitus with diabetic chronic kidney disease: Secondary | ICD-10-CM | POA: Diagnosis not present

## 2019-03-29 DIAGNOSIS — I350 Nonrheumatic aortic (valve) stenosis: Secondary | ICD-10-CM | POA: Diagnosis not present

## 2019-03-30 ENCOUNTER — Other Ambulatory Visit: Payer: Self-pay | Admitting: Primary Care

## 2019-03-30 DIAGNOSIS — K219 Gastro-esophageal reflux disease without esophagitis: Secondary | ICD-10-CM

## 2019-03-30 NOTE — Telephone Encounter (Signed)
Does he get this through Hospice? How long has he been taking omeprazole? Does he have heartburn symptoms on omeprazole?

## 2019-03-30 NOTE — Telephone Encounter (Signed)
Rx have not been prescribed. Last office visit on 09/14/2018. No future appointment

## 2019-04-01 NOTE — Telephone Encounter (Signed)
Message left for patient to return my call.  

## 2019-04-04 DIAGNOSIS — I519 Heart disease, unspecified: Secondary | ICD-10-CM | POA: Diagnosis not present

## 2019-04-04 DIAGNOSIS — I350 Nonrheumatic aortic (valve) stenosis: Secondary | ICD-10-CM | POA: Diagnosis not present

## 2019-04-04 DIAGNOSIS — E1159 Type 2 diabetes mellitus with other circulatory complications: Secondary | ICD-10-CM | POA: Diagnosis not present

## 2019-04-04 DIAGNOSIS — I4891 Unspecified atrial fibrillation: Secondary | ICD-10-CM | POA: Diagnosis not present

## 2019-04-04 DIAGNOSIS — I25119 Atherosclerotic heart disease of native coronary artery with unspecified angina pectoris: Secondary | ICD-10-CM | POA: Diagnosis not present

## 2019-04-04 DIAGNOSIS — E1122 Type 2 diabetes mellitus with diabetic chronic kidney disease: Secondary | ICD-10-CM | POA: Diagnosis not present

## 2019-04-05 MED ORDER — OMEPRAZOLE 40 MG PO CPDR: 40.0000 mg | DELAYED_RELEASE_CAPSULE | Freq: Every day | ORAL | 3 refills | Status: AC

## 2019-04-05 NOTE — Telephone Encounter (Signed)
Spoken to patient. He stated that He does not get this through Hospice. He has been taking this for years, Dr Florina Ou is the one that put him on omepraozle 40 mg. I did ask if he tried 20 mg and he stated that 40 mg works better for him. Yes, omeprazole works good for symptoms, he noticed a difference when he does not take it.

## 2019-04-05 NOTE — Telephone Encounter (Signed)
Pt returned your call  [facility] number [phone]

## 2019-04-05 NOTE — Telephone Encounter (Signed)
Message left for patient to return my call.  

## 2019-04-05 NOTE — Telephone Encounter (Signed)
Noted, refills sent to pharmacy. 

## 2019-04-08 DIAGNOSIS — E1142 Type 2 diabetes mellitus with diabetic polyneuropathy: Secondary | ICD-10-CM | POA: Diagnosis not present

## 2019-04-08 DIAGNOSIS — I519 Heart disease, unspecified: Secondary | ICD-10-CM | POA: Diagnosis not present

## 2019-04-08 DIAGNOSIS — E669 Obesity, unspecified: Secondary | ICD-10-CM | POA: Diagnosis not present

## 2019-04-08 DIAGNOSIS — N183 Chronic kidney disease, stage 3 (moderate): Secondary | ICD-10-CM | POA: Diagnosis not present

## 2019-04-08 DIAGNOSIS — D631 Anemia in chronic kidney disease: Secondary | ICD-10-CM | POA: Diagnosis not present

## 2019-04-08 DIAGNOSIS — J449 Chronic obstructive pulmonary disease, unspecified: Secondary | ICD-10-CM | POA: Diagnosis not present

## 2019-04-08 DIAGNOSIS — E039 Hypothyroidism, unspecified: Secondary | ICD-10-CM | POA: Diagnosis not present

## 2019-04-08 DIAGNOSIS — E1159 Type 2 diabetes mellitus with other circulatory complications: Secondary | ICD-10-CM | POA: Diagnosis not present

## 2019-04-08 DIAGNOSIS — I25119 Atherosclerotic heart disease of native coronary artery with unspecified angina pectoris: Secondary | ICD-10-CM | POA: Diagnosis not present

## 2019-04-08 DIAGNOSIS — I350 Nonrheumatic aortic (valve) stenosis: Secondary | ICD-10-CM | POA: Diagnosis not present

## 2019-04-08 DIAGNOSIS — H409 Unspecified glaucoma: Secondary | ICD-10-CM | POA: Diagnosis not present

## 2019-04-08 DIAGNOSIS — E1122 Type 2 diabetes mellitus with diabetic chronic kidney disease: Secondary | ICD-10-CM | POA: Diagnosis not present

## 2019-04-08 DIAGNOSIS — J302 Other seasonal allergic rhinitis: Secondary | ICD-10-CM | POA: Diagnosis not present

## 2019-04-08 DIAGNOSIS — I4891 Unspecified atrial fibrillation: Secondary | ICD-10-CM | POA: Diagnosis not present

## 2019-04-08 DIAGNOSIS — K219 Gastro-esophageal reflux disease without esophagitis: Secondary | ICD-10-CM | POA: Diagnosis not present

## 2019-04-08 DIAGNOSIS — G4733 Obstructive sleep apnea (adult) (pediatric): Secondary | ICD-10-CM | POA: Diagnosis not present

## 2019-04-11 DIAGNOSIS — E1122 Type 2 diabetes mellitus with diabetic chronic kidney disease: Secondary | ICD-10-CM | POA: Diagnosis not present

## 2019-04-11 DIAGNOSIS — I4891 Unspecified atrial fibrillation: Secondary | ICD-10-CM | POA: Diagnosis not present

## 2019-04-11 DIAGNOSIS — I350 Nonrheumatic aortic (valve) stenosis: Secondary | ICD-10-CM | POA: Diagnosis not present

## 2019-04-11 DIAGNOSIS — I25119 Atherosclerotic heart disease of native coronary artery with unspecified angina pectoris: Secondary | ICD-10-CM | POA: Diagnosis not present

## 2019-04-11 DIAGNOSIS — I519 Heart disease, unspecified: Secondary | ICD-10-CM | POA: Diagnosis not present

## 2019-04-11 DIAGNOSIS — E1159 Type 2 diabetes mellitus with other circulatory complications: Secondary | ICD-10-CM | POA: Diagnosis not present

## 2019-04-13 ENCOUNTER — Other Ambulatory Visit: Payer: Self-pay

## 2019-04-13 ENCOUNTER — Other Ambulatory Visit: Payer: Self-pay | Admitting: Nurse Practitioner

## 2019-04-13 MED ORDER — TRAMADOL HCL 50 MG PO TABS: 100.0000 mg | ORAL_TABLET | Freq: Three times a day (TID) | ORAL | 1 refills | Status: DC | PRN

## 2019-04-13 NOTE — Telephone Encounter (Signed)
[facility] is needing to get a refill on " Trimadol" 50 mg for the patient

## 2019-04-13 NOTE — Progress Notes (Unsigned)
Will send refill request to Truitt Merle to confirm if she wants to refill meds

## 2019-04-13 NOTE — Telephone Encounter (Signed)
Confirmed with [facility]osp[facility] to [facility] Tramadol has been refilled.

## 2019-04-13 NOTE — Telephone Encounter (Signed)
This is ok to refill for Hospice. Please let me know if I will need to send in RX or will need to sign or if verbal order ok.   Burtis Junes, RN, [place] [address] [place], [place]  [phone]

## 2019-04-18 ENCOUNTER — Other Ambulatory Visit: Payer: Self-pay | Admitting: Primary Care

## 2019-04-18 DIAGNOSIS — E7800 Pure hypercholesterolemia, unspecified: Secondary | ICD-10-CM

## 2019-04-18 DIAGNOSIS — Z794 Long term (current) use of insulin: Secondary | ICD-10-CM

## 2019-04-18 DIAGNOSIS — E1159 Type 2 diabetes mellitus with other circulatory complications: Secondary | ICD-10-CM | POA: Diagnosis not present

## 2019-04-18 DIAGNOSIS — I350 Nonrheumatic aortic (valve) stenosis: Secondary | ICD-10-CM | POA: Diagnosis not present

## 2019-04-18 DIAGNOSIS — R0600 Dyspnea, unspecified: Secondary | ICD-10-CM

## 2019-04-18 DIAGNOSIS — E1122 Type 2 diabetes mellitus with diabetic chronic kidney disease: Secondary | ICD-10-CM

## 2019-04-18 DIAGNOSIS — I4891 Unspecified atrial fibrillation: Secondary | ICD-10-CM | POA: Diagnosis not present

## 2019-04-18 DIAGNOSIS — I25119 Atherosclerotic heart disease of native coronary artery with unspecified angina pectoris: Secondary | ICD-10-CM | POA: Diagnosis not present

## 2019-04-18 DIAGNOSIS — I519 Heart disease, unspecified: Secondary | ICD-10-CM | POA: Diagnosis not present

## 2019-04-18 DIAGNOSIS — D509 Iron deficiency anemia, unspecified: Secondary | ICD-10-CM

## 2019-04-18 NOTE — Telephone Encounter (Signed)
°*  STAT* If patient is at the pharmacy, call can be transferred to refill team.   1. Which medications need to be refilled? (please list name of each medication and dose if known)  Furosemide, Metoprolol and [facility]#[id number]  2. Which pharmacy/location (including street and city if local pharmacy) is medication to be sent to? [phone]  3. Do they need a 30 day or 90 day supply? 90 days supply

## 2019-04-19 ENCOUNTER — Other Ambulatory Visit: Payer: Self-pay | Admitting: Nurse Practitioner

## 2019-04-19 MED ORDER — METOPROLOL TARTRATE 25 MG PO TABS: 25.0000 mg | ORAL_TABLET | Freq: Two times a day (BID) | ORAL | 1 refills | Status: DC

## 2019-04-19 MED ORDER — FUROSEMIDE 40 MG PO TABS: 40.0000 mg | ORAL_TABLET | Freq: Two times a day (BID) | ORAL | 1 refills | Status: DC

## 2019-04-19 NOTE — Telephone Encounter (Signed)
Pt last saw Truitt Merle, NP 07/07/18, last labs 06/22/18 Creat 1.62, age 78, weight 138.3kg, based on specified criteria pt is on appropriate dosage of Eliquis 5mg  BID.  Will refill rx.

## 2019-04-19 NOTE — Telephone Encounter (Signed)
Pt's medication was sent to pt's pharmacy as requested. Confirmation received.  °

## 2019-04-25 DIAGNOSIS — I350 Nonrheumatic aortic (valve) stenosis: Secondary | ICD-10-CM | POA: Diagnosis not present

## 2019-04-25 DIAGNOSIS — E1122 Type 2 diabetes mellitus with diabetic chronic kidney disease: Secondary | ICD-10-CM | POA: Diagnosis not present

## 2019-04-25 DIAGNOSIS — I519 Heart disease, unspecified: Secondary | ICD-10-CM | POA: Diagnosis not present

## 2019-04-25 DIAGNOSIS — I4891 Unspecified atrial fibrillation: Secondary | ICD-10-CM | POA: Diagnosis not present

## 2019-04-25 DIAGNOSIS — E1159 Type 2 diabetes mellitus with other circulatory complications: Secondary | ICD-10-CM | POA: Diagnosis not present

## 2019-04-25 DIAGNOSIS — I25119 Atherosclerotic heart disease of native coronary artery with unspecified angina pectoris: Secondary | ICD-10-CM | POA: Diagnosis not present

## 2019-05-03 DIAGNOSIS — E1159 Type 2 diabetes mellitus with other circulatory complications: Secondary | ICD-10-CM | POA: Diagnosis not present

## 2019-05-03 DIAGNOSIS — E1122 Type 2 diabetes mellitus with diabetic chronic kidney disease: Secondary | ICD-10-CM | POA: Diagnosis not present

## 2019-05-03 DIAGNOSIS — I519 Heart disease, unspecified: Secondary | ICD-10-CM | POA: Diagnosis not present

## 2019-05-03 DIAGNOSIS — I4891 Unspecified atrial fibrillation: Secondary | ICD-10-CM | POA: Diagnosis not present

## 2019-05-03 DIAGNOSIS — I350 Nonrheumatic aortic (valve) stenosis: Secondary | ICD-10-CM | POA: Diagnosis not present

## 2019-05-03 DIAGNOSIS — I25119 Atherosclerotic heart disease of native coronary artery with unspecified angina pectoris: Secondary | ICD-10-CM | POA: Diagnosis not present

## 2019-05-04 ENCOUNTER — Ambulatory Visit (INDEPENDENT_AMBULATORY_CARE_PROVIDER_SITE_OTHER): Payer: Medicare Other | Admitting: *Deleted

## 2019-05-04 DIAGNOSIS — I495 Sick sinus syndrome: Secondary | ICD-10-CM

## 2019-05-05 ENCOUNTER — Telehealth: Payer: Self-pay | Admitting: *Deleted

## 2019-05-05 LAB — CUP PACEART REMOTE DEVICE CHECK
Date Time Interrogation Session: 20200528084110
Implantable Lead Implant Date: 20110705
Implantable Lead Implant Date: 20110705
Implantable Lead Location: 753859
Implantable Lead Location: 753860
Implantable Pulse Generator Implant Date: 20110705
Pulse Gen Model: 2210
Pulse Gen Serial Number: 7152830

## 2019-05-05 NOTE — Telephone Encounter (Signed)
Contacted pt regarding device nearing ERI. Pt reports it would be very difficult for him to come into the office, confirmed he is under hospice care. He is aware of plan for monthly battery checks and denies questions at this time. Scheduled for next transmission on 06/03/19.  Noted that RV AutoCapture is in high output mode at the time of this transmission (previously noted intermittent high thresholds per trends). Will await recommendations from Dr. Veva Holes via result note.

## 2019-05-09 DIAGNOSIS — E039 Hypothyroidism, unspecified: Secondary | ICD-10-CM | POA: Diagnosis not present

## 2019-05-09 DIAGNOSIS — L97819 Non-pressure chronic ulcer of other part of right lower leg with unspecified severity: Secondary | ICD-10-CM | POA: Diagnosis not present

## 2019-05-09 DIAGNOSIS — J449 Chronic obstructive pulmonary disease, unspecified: Secondary | ICD-10-CM | POA: Diagnosis not present

## 2019-05-09 DIAGNOSIS — E1122 Type 2 diabetes mellitus with diabetic chronic kidney disease: Secondary | ICD-10-CM | POA: Diagnosis not present

## 2019-05-09 DIAGNOSIS — E669 Obesity, unspecified: Secondary | ICD-10-CM | POA: Diagnosis not present

## 2019-05-09 DIAGNOSIS — L97829 Non-pressure chronic ulcer of other part of left lower leg with unspecified severity: Secondary | ICD-10-CM | POA: Diagnosis not present

## 2019-05-09 DIAGNOSIS — N183 Chronic kidney disease, stage 3 (moderate): Secondary | ICD-10-CM | POA: Diagnosis not present

## 2019-05-09 DIAGNOSIS — I4891 Unspecified atrial fibrillation: Secondary | ICD-10-CM | POA: Diagnosis not present

## 2019-05-09 DIAGNOSIS — E1142 Type 2 diabetes mellitus with diabetic polyneuropathy: Secondary | ICD-10-CM | POA: Diagnosis not present

## 2019-05-09 DIAGNOSIS — I350 Nonrheumatic aortic (valve) stenosis: Secondary | ICD-10-CM | POA: Diagnosis not present

## 2019-05-09 DIAGNOSIS — G4733 Obstructive sleep apnea (adult) (pediatric): Secondary | ICD-10-CM | POA: Diagnosis not present

## 2019-05-09 DIAGNOSIS — K219 Gastro-esophageal reflux disease without esophagitis: Secondary | ICD-10-CM | POA: Diagnosis not present

## 2019-05-09 DIAGNOSIS — J302 Other seasonal allergic rhinitis: Secondary | ICD-10-CM | POA: Diagnosis not present

## 2019-05-09 DIAGNOSIS — I83028 Varicose veins of left lower extremity with ulcer other part of lower leg: Secondary | ICD-10-CM | POA: Diagnosis not present

## 2019-05-09 DIAGNOSIS — I25119 Atherosclerotic heart disease of native coronary artery with unspecified angina pectoris: Secondary | ICD-10-CM | POA: Diagnosis not present

## 2019-05-09 DIAGNOSIS — H409 Unspecified glaucoma: Secondary | ICD-10-CM | POA: Diagnosis not present

## 2019-05-09 DIAGNOSIS — I83018 Varicose veins of right lower extremity with ulcer other part of lower leg: Secondary | ICD-10-CM | POA: Diagnosis not present

## 2019-05-09 DIAGNOSIS — E1159 Type 2 diabetes mellitus with other circulatory complications: Secondary | ICD-10-CM | POA: Diagnosis not present

## 2019-05-09 DIAGNOSIS — D631 Anemia in chronic kidney disease: Secondary | ICD-10-CM | POA: Diagnosis not present

## 2019-05-09 DIAGNOSIS — I509 Heart failure, unspecified: Secondary | ICD-10-CM | POA: Diagnosis not present

## 2019-05-13 ENCOUNTER — Encounter: Payer: Self-pay | Admitting: Cardiology

## 2019-05-13 NOTE — Progress Notes (Signed)
Remote pacemaker transmission.   

## 2019-05-16 DIAGNOSIS — E1122 Type 2 diabetes mellitus with diabetic chronic kidney disease: Secondary | ICD-10-CM | POA: Diagnosis not present

## 2019-05-16 DIAGNOSIS — I4891 Unspecified atrial fibrillation: Secondary | ICD-10-CM | POA: Diagnosis not present

## 2019-05-16 DIAGNOSIS — I25119 Atherosclerotic heart disease of native coronary artery with unspecified angina pectoris: Secondary | ICD-10-CM | POA: Diagnosis not present

## 2019-05-16 DIAGNOSIS — I350 Nonrheumatic aortic (valve) stenosis: Secondary | ICD-10-CM | POA: Diagnosis not present

## 2019-05-16 DIAGNOSIS — N183 Chronic kidney disease, stage 3 (moderate): Secondary | ICD-10-CM | POA: Diagnosis not present

## 2019-05-16 DIAGNOSIS — I509 Heart failure, unspecified: Secondary | ICD-10-CM | POA: Diagnosis not present

## 2019-05-20 ENCOUNTER — Other Ambulatory Visit: Payer: Self-pay | Admitting: Nurse Practitioner

## 2019-05-20 ENCOUNTER — Telehealth: Payer: Self-pay | Admitting: Internal Medicine

## 2019-05-20 MED ORDER — NITROGLYCERIN 0.4 MG SL SUBL: 0.4000 mg | SUBLINGUAL_TABLET | SUBLINGUAL | 1 refills | Status: DC | PRN

## 2019-05-20 NOTE — Telephone Encounter (Signed)
Pt' medication was sent to pt's pharmacy as requested. Confirmation received.  

## 2019-05-20 NOTE — Telephone Encounter (Signed)
Alert that pacemaker at [facility] as of 05/20/19. Pt is in care of Hospice. Spoke with wife [name] ([place]). Pts condition continues to decline and she reports he may only has 3 months to live. Pt was placed on continuous home oxygen yesterday. Wife reports she is trying to [facility] pt comfortable and Hospice is helping with this process. Informed that Dr Rayann Heman will be updated on pt's condition.

## 2019-05-20 NOTE — Telephone Encounter (Signed)
°*  STAT* If patient is at the pharmacy, call can be transferred to refill team.   1. Which medications need to be refilled? (please list name of each medication and dose if known)  nitroGLYCERIN (NITROSTAT) 0.4 MG SL tablet(Expired)  2. Which pharmacy/location (including street and city if local pharmacy) is medication to be sent to? [id number] [name], [place] - [address]  3. Do they need a 30 day or 90 day supply? [name] calling on behalf of the patient. You can leave a message on her cell phone if you are unable to contact her

## 2019-05-22 NOTE — Telephone Encounter (Signed)
We will therefore not plan to proceed with pacemaker generator change given his wishes to hospice/ comfort measures only.

## 2019-05-23 DIAGNOSIS — I25119 Atherosclerotic heart disease of native coronary artery with unspecified angina pectoris: Secondary | ICD-10-CM | POA: Diagnosis not present

## 2019-05-23 DIAGNOSIS — N183 Chronic kidney disease, stage 3 (moderate): Secondary | ICD-10-CM | POA: Diagnosis not present

## 2019-05-23 DIAGNOSIS — I350 Nonrheumatic aortic (valve) stenosis: Secondary | ICD-10-CM | POA: Diagnosis not present

## 2019-05-23 DIAGNOSIS — I509 Heart failure, unspecified: Secondary | ICD-10-CM | POA: Diagnosis not present

## 2019-05-23 DIAGNOSIS — I4891 Unspecified atrial fibrillation: Secondary | ICD-10-CM | POA: Diagnosis not present

## 2019-05-23 DIAGNOSIS — E1122 Type 2 diabetes mellitus with diabetic chronic kidney disease: Secondary | ICD-10-CM | POA: Diagnosis not present

## 2019-05-30 DIAGNOSIS — N183 Chronic kidney disease, stage 3 (moderate): Secondary | ICD-10-CM | POA: Diagnosis not present

## 2019-05-30 DIAGNOSIS — E1122 Type 2 diabetes mellitus with diabetic chronic kidney disease: Secondary | ICD-10-CM | POA: Diagnosis not present

## 2019-05-30 DIAGNOSIS — I350 Nonrheumatic aortic (valve) stenosis: Secondary | ICD-10-CM | POA: Diagnosis not present

## 2019-05-30 DIAGNOSIS — I25119 Atherosclerotic heart disease of native coronary artery with unspecified angina pectoris: Secondary | ICD-10-CM | POA: Diagnosis not present

## 2019-05-30 DIAGNOSIS — I509 Heart failure, unspecified: Secondary | ICD-10-CM | POA: Diagnosis not present

## 2019-05-30 DIAGNOSIS — I4891 Unspecified atrial fibrillation: Secondary | ICD-10-CM | POA: Diagnosis not present

## 2019-05-30 NOTE — Telephone Encounter (Signed)
See phone note from 05/20/19.

## 2019-06-03 ENCOUNTER — Ambulatory Visit (INDEPENDENT_AMBULATORY_CARE_PROVIDER_SITE_OTHER): Payer: Medicare HMO | Admitting: *Deleted

## 2019-06-03 DIAGNOSIS — I495 Sick sinus syndrome: Secondary | ICD-10-CM

## 2019-06-04 ENCOUNTER — Other Ambulatory Visit: Payer: Self-pay | Admitting: Primary Care

## 2019-06-04 DIAGNOSIS — E7800 Pure hypercholesterolemia, unspecified: Secondary | ICD-10-CM

## 2019-06-04 LAB — CUP PACEART REMOTE DEVICE CHECK
Battery Remaining Longevity: 0 mo
Battery Voltage: 2.59 V
Brady Statistic AP VP Percent: 98 %
Brady Statistic AP VS Percent: 1.6 %
Brady Statistic AS VP Percent: 1 %
Brady Statistic AS VS Percent: 1 %
Brady Statistic RA Percent Paced: 90 %
Brady Statistic RV Percent Paced: 98 %
Date Time Interrogation Session: 20200626071738
Implantable Lead Implant Date: 20110705
Implantable Lead Implant Date: 20110705
Implantable Lead Location: 753859
Implantable Lead Location: 753860
Implantable Pulse Generator Implant Date: 20110705
Lead Channel Impedance Value: 300 Ohm
Lead Channel Impedance Value: 340 Ohm
Lead Channel Pacing Threshold Amplitude: 0.75 V
Lead Channel Pacing Threshold Amplitude: 1.125 V
Lead Channel Pacing Threshold Pulse Width: 0.5 ms
Lead Channel Pacing Threshold Pulse Width: 0.5 ms
Lead Channel Sensing Intrinsic Amplitude: 12 mV
Lead Channel Sensing Intrinsic Amplitude: 2.2 mV
Lead Channel Setting Pacing Amplitude: 1.375
Lead Channel Setting Pacing Amplitude: 1.75 V
Lead Channel Setting Pacing Pulse Width: 0.5 ms
Lead Channel Setting Sensing Sensitivity: 4 mV
Pulse Gen Model: 2210
Pulse Gen Serial Number: 7152830

## 2019-06-06 DIAGNOSIS — I350 Nonrheumatic aortic (valve) stenosis: Secondary | ICD-10-CM | POA: Diagnosis not present

## 2019-06-06 DIAGNOSIS — E1122 Type 2 diabetes mellitus with diabetic chronic kidney disease: Secondary | ICD-10-CM | POA: Diagnosis not present

## 2019-06-06 DIAGNOSIS — I25119 Atherosclerotic heart disease of native coronary artery with unspecified angina pectoris: Secondary | ICD-10-CM | POA: Diagnosis not present

## 2019-06-06 DIAGNOSIS — I4891 Unspecified atrial fibrillation: Secondary | ICD-10-CM | POA: Diagnosis not present

## 2019-06-06 DIAGNOSIS — N183 Chronic kidney disease, stage 3 (moderate): Secondary | ICD-10-CM | POA: Diagnosis not present

## 2019-06-06 DIAGNOSIS — I509 Heart failure, unspecified: Secondary | ICD-10-CM | POA: Diagnosis not present

## 2019-06-08 DIAGNOSIS — K219 Gastro-esophageal reflux disease without esophagitis: Secondary | ICD-10-CM | POA: Diagnosis not present

## 2019-06-08 DIAGNOSIS — E1159 Type 2 diabetes mellitus with other circulatory complications: Secondary | ICD-10-CM | POA: Diagnosis not present

## 2019-06-08 DIAGNOSIS — I25119 Atherosclerotic heart disease of native coronary artery with unspecified angina pectoris: Secondary | ICD-10-CM | POA: Diagnosis not present

## 2019-06-08 DIAGNOSIS — J449 Chronic obstructive pulmonary disease, unspecified: Secondary | ICD-10-CM | POA: Diagnosis not present

## 2019-06-08 DIAGNOSIS — E669 Obesity, unspecified: Secondary | ICD-10-CM | POA: Diagnosis not present

## 2019-06-08 DIAGNOSIS — H409 Unspecified glaucoma: Secondary | ICD-10-CM | POA: Diagnosis not present

## 2019-06-08 DIAGNOSIS — I509 Heart failure, unspecified: Secondary | ICD-10-CM | POA: Diagnosis not present

## 2019-06-08 DIAGNOSIS — G4733 Obstructive sleep apnea (adult) (pediatric): Secondary | ICD-10-CM | POA: Diagnosis not present

## 2019-06-08 DIAGNOSIS — I350 Nonrheumatic aortic (valve) stenosis: Secondary | ICD-10-CM | POA: Diagnosis not present

## 2019-06-08 DIAGNOSIS — E039 Hypothyroidism, unspecified: Secondary | ICD-10-CM | POA: Diagnosis not present

## 2019-06-08 DIAGNOSIS — N183 Chronic kidney disease, stage 3 (moderate): Secondary | ICD-10-CM | POA: Diagnosis not present

## 2019-06-08 DIAGNOSIS — I4891 Unspecified atrial fibrillation: Secondary | ICD-10-CM | POA: Diagnosis not present

## 2019-06-08 DIAGNOSIS — J302 Other seasonal allergic rhinitis: Secondary | ICD-10-CM | POA: Diagnosis not present

## 2019-06-08 DIAGNOSIS — I83028 Varicose veins of left lower extremity with ulcer other part of lower leg: Secondary | ICD-10-CM | POA: Diagnosis not present

## 2019-06-08 DIAGNOSIS — L97819 Non-pressure chronic ulcer of other part of right lower leg with unspecified severity: Secondary | ICD-10-CM | POA: Diagnosis not present

## 2019-06-08 DIAGNOSIS — L97829 Non-pressure chronic ulcer of other part of left lower leg with unspecified severity: Secondary | ICD-10-CM | POA: Diagnosis not present

## 2019-06-08 DIAGNOSIS — I83018 Varicose veins of right lower extremity with ulcer other part of lower leg: Secondary | ICD-10-CM | POA: Diagnosis not present

## 2019-06-08 DIAGNOSIS — D631 Anemia in chronic kidney disease: Secondary | ICD-10-CM | POA: Diagnosis not present

## 2019-06-08 DIAGNOSIS — E1142 Type 2 diabetes mellitus with diabetic polyneuropathy: Secondary | ICD-10-CM | POA: Diagnosis not present

## 2019-06-08 DIAGNOSIS — E1122 Type 2 diabetes mellitus with diabetic chronic kidney disease: Secondary | ICD-10-CM | POA: Diagnosis not present

## 2019-06-09 ENCOUNTER — Encounter: Payer: Self-pay | Admitting: Cardiology

## 2019-06-09 NOTE — Progress Notes (Signed)
Remote pacemaker transmission.   

## 2019-06-09 NOTE — Addendum Note (Signed)
Addended by: Tiajuana Amass on: 06/09/2019 10:46 AM   Modules accepted: Level of Service

## 2019-06-13 DIAGNOSIS — E1122 Type 2 diabetes mellitus with diabetic chronic kidney disease: Secondary | ICD-10-CM | POA: Diagnosis not present

## 2019-06-13 DIAGNOSIS — N183 Chronic kidney disease, stage 3 (moderate): Secondary | ICD-10-CM | POA: Diagnosis not present

## 2019-06-13 DIAGNOSIS — I25119 Atherosclerotic heart disease of native coronary artery with unspecified angina pectoris: Secondary | ICD-10-CM | POA: Diagnosis not present

## 2019-06-13 DIAGNOSIS — I350 Nonrheumatic aortic (valve) stenosis: Secondary | ICD-10-CM | POA: Diagnosis not present

## 2019-06-13 DIAGNOSIS — I4891 Unspecified atrial fibrillation: Secondary | ICD-10-CM | POA: Diagnosis not present

## 2019-06-13 DIAGNOSIS — I509 Heart failure, unspecified: Secondary | ICD-10-CM | POA: Diagnosis not present

## 2019-06-17 DIAGNOSIS — I4891 Unspecified atrial fibrillation: Secondary | ICD-10-CM | POA: Diagnosis not present

## 2019-06-17 DIAGNOSIS — I350 Nonrheumatic aortic (valve) stenosis: Secondary | ICD-10-CM | POA: Diagnosis not present

## 2019-06-17 DIAGNOSIS — I509 Heart failure, unspecified: Secondary | ICD-10-CM | POA: Diagnosis not present

## 2019-06-17 DIAGNOSIS — I25119 Atherosclerotic heart disease of native coronary artery with unspecified angina pectoris: Secondary | ICD-10-CM | POA: Diagnosis not present

## 2019-06-17 DIAGNOSIS — E1122 Type 2 diabetes mellitus with diabetic chronic kidney disease: Secondary | ICD-10-CM | POA: Diagnosis not present

## 2019-06-17 DIAGNOSIS — N183 Chronic kidney disease, stage 3 (moderate): Secondary | ICD-10-CM | POA: Diagnosis not present

## 2019-06-20 DIAGNOSIS — E1122 Type 2 diabetes mellitus with diabetic chronic kidney disease: Secondary | ICD-10-CM | POA: Diagnosis not present

## 2019-06-20 DIAGNOSIS — I25119 Atherosclerotic heart disease of native coronary artery with unspecified angina pectoris: Secondary | ICD-10-CM | POA: Diagnosis not present

## 2019-06-20 DIAGNOSIS — I350 Nonrheumatic aortic (valve) stenosis: Secondary | ICD-10-CM | POA: Diagnosis not present

## 2019-06-20 DIAGNOSIS — I509 Heart failure, unspecified: Secondary | ICD-10-CM | POA: Diagnosis not present

## 2019-06-20 DIAGNOSIS — I4891 Unspecified atrial fibrillation: Secondary | ICD-10-CM | POA: Diagnosis not present

## 2019-06-20 DIAGNOSIS — N183 Chronic kidney disease, stage 3 (moderate): Secondary | ICD-10-CM | POA: Diagnosis not present

## 2019-06-27 DIAGNOSIS — I4891 Unspecified atrial fibrillation: Secondary | ICD-10-CM | POA: Diagnosis not present

## 2019-06-27 DIAGNOSIS — N183 Chronic kidney disease, stage 3 (moderate): Secondary | ICD-10-CM | POA: Diagnosis not present

## 2019-06-27 DIAGNOSIS — I350 Nonrheumatic aortic (valve) stenosis: Secondary | ICD-10-CM | POA: Diagnosis not present

## 2019-06-27 DIAGNOSIS — E1122 Type 2 diabetes mellitus with diabetic chronic kidney disease: Secondary | ICD-10-CM | POA: Diagnosis not present

## 2019-06-27 DIAGNOSIS — I509 Heart failure, unspecified: Secondary | ICD-10-CM | POA: Diagnosis not present

## 2019-06-27 DIAGNOSIS — I25119 Atherosclerotic heart disease of native coronary artery with unspecified angina pectoris: Secondary | ICD-10-CM | POA: Diagnosis not present

## 2019-06-28 ENCOUNTER — Other Ambulatory Visit: Payer: Self-pay | Admitting: Primary Care

## 2019-06-28 DIAGNOSIS — D509 Iron deficiency anemia, unspecified: Secondary | ICD-10-CM

## 2019-06-28 DIAGNOSIS — N183 Chronic kidney disease, stage 3 unspecified: Secondary | ICD-10-CM

## 2019-06-28 DIAGNOSIS — E7800 Pure hypercholesterolemia, unspecified: Secondary | ICD-10-CM

## 2019-06-28 DIAGNOSIS — R0600 Dyspnea, unspecified: Secondary | ICD-10-CM

## 2019-06-28 DIAGNOSIS — E1122 Type 2 diabetes mellitus with diabetic chronic kidney disease: Secondary | ICD-10-CM

## 2019-07-01 ENCOUNTER — Telehealth: Payer: Self-pay | Admitting: Nurse Practitioner

## 2019-07-01 NOTE — Telephone Encounter (Signed)
Left message that we had heard from [place] and if assistance was still needed to please call office.

## 2019-07-01 NOTE — Telephone Encounter (Signed)
Ok to given him Septra DS one tablet BID for 3 days if house physician has not already prescribed something.   Burtis Junes, RN, [place] [address] [place], [place]  [phone]

## 2019-07-01 NOTE — Telephone Encounter (Signed)
I spoke with [name]. She reports Truitt Merle, NP is listed as pt's primary provider for Hospice. I told Jackson Latino was not in the office today.  [name] will contact Hospice physician to see if he would prescribe antibiotic for pt

## 2019-07-01 NOTE — Telephone Encounter (Signed)
New Message:    [name] from [facility] is calling.  She says pt  is having UTI symptoms, dark  Rust urine, foul odor,and burning . She wants to know if Truitt Merle would call in an Antibiotic for him.a

## 2019-07-04 DIAGNOSIS — N183 Chronic kidney disease, stage 3 (moderate): Secondary | ICD-10-CM | POA: Diagnosis not present

## 2019-07-04 DIAGNOSIS — E1122 Type 2 diabetes mellitus with diabetic chronic kidney disease: Secondary | ICD-10-CM | POA: Diagnosis not present

## 2019-07-04 DIAGNOSIS — I4891 Unspecified atrial fibrillation: Secondary | ICD-10-CM | POA: Diagnosis not present

## 2019-07-04 DIAGNOSIS — I350 Nonrheumatic aortic (valve) stenosis: Secondary | ICD-10-CM | POA: Diagnosis not present

## 2019-07-04 DIAGNOSIS — I25119 Atherosclerotic heart disease of native coronary artery with unspecified angina pectoris: Secondary | ICD-10-CM | POA: Diagnosis not present

## 2019-07-04 DIAGNOSIS — I509 Heart failure, unspecified: Secondary | ICD-10-CM | POA: Diagnosis not present

## 2019-07-08 ENCOUNTER — Telehealth: Payer: Self-pay

## 2019-07-08 ENCOUNTER — Ambulatory Visit (INDEPENDENT_AMBULATORY_CARE_PROVIDER_SITE_OTHER): Payer: Medicare HMO | Admitting: *Deleted

## 2019-07-08 DIAGNOSIS — I495 Sick sinus syndrome: Secondary | ICD-10-CM

## 2019-07-08 LAB — CUP PACEART REMOTE DEVICE CHECK
Battery Remaining Longevity: 0 mo
Battery Voltage: 2.57 V
Brady Statistic AP VP Percent: 98 %
Brady Statistic AP VS Percent: 1.5 %
Brady Statistic AS VP Percent: 1 %
Brady Statistic AS VS Percent: 1 %
Brady Statistic RA Percent Paced: 90 %
Brady Statistic RV Percent Paced: 98 %
Date Time Interrogation Session: 20200731144741
Implantable Lead Implant Date: 20110705
Implantable Lead Implant Date: 20110705
Implantable Lead Location: 753859
Implantable Lead Location: 753860
Implantable Pulse Generator Implant Date: 20110705
Lead Channel Impedance Value: 300 Ohm
Lead Channel Impedance Value: 330 Ohm
Lead Channel Pacing Threshold Amplitude: 0.75 V
Lead Channel Pacing Threshold Amplitude: 0.875 V
Lead Channel Pacing Threshold Pulse Width: 0.5 ms
Lead Channel Pacing Threshold Pulse Width: 0.5 ms
Lead Channel Sensing Intrinsic Amplitude: 12 mV
Lead Channel Sensing Intrinsic Amplitude: 2.1 mV
Lead Channel Setting Pacing Amplitude: 1.125
Lead Channel Setting Pacing Amplitude: 1.75 V
Lead Channel Setting Pacing Pulse Width: 0.5 ms
Lead Channel Setting Sensing Sensitivity: 4 mV
Pulse Gen Model: 2210
Pulse Gen Serial Number: 7152830

## 2019-07-08 NOTE — Telephone Encounter (Signed)
Spoke with patient to remind of missed remote transmission 

## 2019-07-09 DIAGNOSIS — L97819 Non-pressure chronic ulcer of other part of right lower leg with unspecified severity: Secondary | ICD-10-CM | POA: Diagnosis not present

## 2019-07-09 DIAGNOSIS — J302 Other seasonal allergic rhinitis: Secondary | ICD-10-CM | POA: Diagnosis not present

## 2019-07-09 DIAGNOSIS — I83018 Varicose veins of right lower extremity with ulcer other part of lower leg: Secondary | ICD-10-CM | POA: Diagnosis not present

## 2019-07-09 DIAGNOSIS — L97829 Non-pressure chronic ulcer of other part of left lower leg with unspecified severity: Secondary | ICD-10-CM | POA: Diagnosis not present

## 2019-07-09 DIAGNOSIS — I350 Nonrheumatic aortic (valve) stenosis: Secondary | ICD-10-CM | POA: Diagnosis not present

## 2019-07-09 DIAGNOSIS — K219 Gastro-esophageal reflux disease without esophagitis: Secondary | ICD-10-CM | POA: Diagnosis not present

## 2019-07-09 DIAGNOSIS — E669 Obesity, unspecified: Secondary | ICD-10-CM | POA: Diagnosis not present

## 2019-07-09 DIAGNOSIS — I4891 Unspecified atrial fibrillation: Secondary | ICD-10-CM | POA: Diagnosis not present

## 2019-07-09 DIAGNOSIS — I25119 Atherosclerotic heart disease of native coronary artery with unspecified angina pectoris: Secondary | ICD-10-CM | POA: Diagnosis not present

## 2019-07-09 DIAGNOSIS — D631 Anemia in chronic kidney disease: Secondary | ICD-10-CM | POA: Diagnosis not present

## 2019-07-09 DIAGNOSIS — N183 Chronic kidney disease, stage 3 (moderate): Secondary | ICD-10-CM | POA: Diagnosis not present

## 2019-07-09 DIAGNOSIS — H409 Unspecified glaucoma: Secondary | ICD-10-CM | POA: Diagnosis not present

## 2019-07-09 DIAGNOSIS — E1159 Type 2 diabetes mellitus with other circulatory complications: Secondary | ICD-10-CM | POA: Diagnosis not present

## 2019-07-09 DIAGNOSIS — I509 Heart failure, unspecified: Secondary | ICD-10-CM | POA: Diagnosis not present

## 2019-07-09 DIAGNOSIS — G4733 Obstructive sleep apnea (adult) (pediatric): Secondary | ICD-10-CM | POA: Diagnosis not present

## 2019-07-09 DIAGNOSIS — E1142 Type 2 diabetes mellitus with diabetic polyneuropathy: Secondary | ICD-10-CM | POA: Diagnosis not present

## 2019-07-09 DIAGNOSIS — Z9981 Dependence on supplemental oxygen: Secondary | ICD-10-CM | POA: Diagnosis not present

## 2019-07-09 DIAGNOSIS — I83028 Varicose veins of left lower extremity with ulcer other part of lower leg: Secondary | ICD-10-CM | POA: Diagnosis not present

## 2019-07-09 DIAGNOSIS — E039 Hypothyroidism, unspecified: Secondary | ICD-10-CM | POA: Diagnosis not present

## 2019-07-09 DIAGNOSIS — J449 Chronic obstructive pulmonary disease, unspecified: Secondary | ICD-10-CM | POA: Diagnosis not present

## 2019-07-09 DIAGNOSIS — E1122 Type 2 diabetes mellitus with diabetic chronic kidney disease: Secondary | ICD-10-CM | POA: Diagnosis not present

## 2019-07-11 DIAGNOSIS — I509 Heart failure, unspecified: Secondary | ICD-10-CM | POA: Diagnosis not present

## 2019-07-11 DIAGNOSIS — E669 Obesity, unspecified: Secondary | ICD-10-CM | POA: Diagnosis not present

## 2019-07-11 DIAGNOSIS — I350 Nonrheumatic aortic (valve) stenosis: Secondary | ICD-10-CM | POA: Diagnosis not present

## 2019-07-11 DIAGNOSIS — E1122 Type 2 diabetes mellitus with diabetic chronic kidney disease: Secondary | ICD-10-CM | POA: Diagnosis not present

## 2019-07-11 DIAGNOSIS — I25119 Atherosclerotic heart disease of native coronary artery with unspecified angina pectoris: Secondary | ICD-10-CM | POA: Diagnosis not present

## 2019-07-11 DIAGNOSIS — J449 Chronic obstructive pulmonary disease, unspecified: Secondary | ICD-10-CM | POA: Diagnosis not present

## 2019-07-14 ENCOUNTER — Encounter: Payer: Self-pay | Admitting: Cardiology

## 2019-07-14 NOTE — Progress Notes (Signed)
Remote pacemaker transmission.   

## 2019-07-18 DIAGNOSIS — E1122 Type 2 diabetes mellitus with diabetic chronic kidney disease: Secondary | ICD-10-CM | POA: Diagnosis not present

## 2019-07-18 DIAGNOSIS — J449 Chronic obstructive pulmonary disease, unspecified: Secondary | ICD-10-CM | POA: Diagnosis not present

## 2019-07-18 DIAGNOSIS — I25119 Atherosclerotic heart disease of native coronary artery with unspecified angina pectoris: Secondary | ICD-10-CM | POA: Diagnosis not present

## 2019-07-18 DIAGNOSIS — I509 Heart failure, unspecified: Secondary | ICD-10-CM | POA: Diagnosis not present

## 2019-07-18 DIAGNOSIS — I350 Nonrheumatic aortic (valve) stenosis: Secondary | ICD-10-CM | POA: Diagnosis not present

## 2019-07-18 DIAGNOSIS — E669 Obesity, unspecified: Secondary | ICD-10-CM | POA: Diagnosis not present

## 2019-07-25 DIAGNOSIS — I25119 Atherosclerotic heart disease of native coronary artery with unspecified angina pectoris: Secondary | ICD-10-CM | POA: Diagnosis not present

## 2019-07-25 DIAGNOSIS — I509 Heart failure, unspecified: Secondary | ICD-10-CM | POA: Diagnosis not present

## 2019-07-25 DIAGNOSIS — I350 Nonrheumatic aortic (valve) stenosis: Secondary | ICD-10-CM | POA: Diagnosis not present

## 2019-07-25 DIAGNOSIS — E669 Obesity, unspecified: Secondary | ICD-10-CM | POA: Diagnosis not present

## 2019-07-25 DIAGNOSIS — J449 Chronic obstructive pulmonary disease, unspecified: Secondary | ICD-10-CM | POA: Diagnosis not present

## 2019-07-25 DIAGNOSIS — E1122 Type 2 diabetes mellitus with diabetic chronic kidney disease: Secondary | ICD-10-CM | POA: Diagnosis not present

## 2019-08-01 DIAGNOSIS — I509 Heart failure, unspecified: Secondary | ICD-10-CM | POA: Diagnosis not present

## 2019-08-01 DIAGNOSIS — I25119 Atherosclerotic heart disease of native coronary artery with unspecified angina pectoris: Secondary | ICD-10-CM | POA: Diagnosis not present

## 2019-08-01 DIAGNOSIS — I350 Nonrheumatic aortic (valve) stenosis: Secondary | ICD-10-CM | POA: Diagnosis not present

## 2019-08-01 DIAGNOSIS — E1122 Type 2 diabetes mellitus with diabetic chronic kidney disease: Secondary | ICD-10-CM | POA: Diagnosis not present

## 2019-08-01 DIAGNOSIS — J449 Chronic obstructive pulmonary disease, unspecified: Secondary | ICD-10-CM | POA: Diagnosis not present

## 2019-08-01 DIAGNOSIS — E669 Obesity, unspecified: Secondary | ICD-10-CM | POA: Diagnosis not present

## 2019-08-08 DIAGNOSIS — I350 Nonrheumatic aortic (valve) stenosis: Secondary | ICD-10-CM | POA: Diagnosis not present

## 2019-08-08 DIAGNOSIS — I509 Heart failure, unspecified: Secondary | ICD-10-CM | POA: Diagnosis not present

## 2019-08-08 DIAGNOSIS — E669 Obesity, unspecified: Secondary | ICD-10-CM | POA: Diagnosis not present

## 2019-08-08 DIAGNOSIS — I25119 Atherosclerotic heart disease of native coronary artery with unspecified angina pectoris: Secondary | ICD-10-CM | POA: Diagnosis not present

## 2019-08-08 DIAGNOSIS — E1122 Type 2 diabetes mellitus with diabetic chronic kidney disease: Secondary | ICD-10-CM | POA: Diagnosis not present

## 2019-08-08 DIAGNOSIS — J449 Chronic obstructive pulmonary disease, unspecified: Secondary | ICD-10-CM | POA: Diagnosis not present

## 2019-08-09 DIAGNOSIS — E1159 Type 2 diabetes mellitus with other circulatory complications: Secondary | ICD-10-CM | POA: Diagnosis not present

## 2019-08-09 DIAGNOSIS — I509 Heart failure, unspecified: Secondary | ICD-10-CM | POA: Diagnosis not present

## 2019-08-09 DIAGNOSIS — Z9981 Dependence on supplemental oxygen: Secondary | ICD-10-CM | POA: Diagnosis not present

## 2019-08-09 DIAGNOSIS — D631 Anemia in chronic kidney disease: Secondary | ICD-10-CM | POA: Diagnosis not present

## 2019-08-09 DIAGNOSIS — E669 Obesity, unspecified: Secondary | ICD-10-CM | POA: Diagnosis not present

## 2019-08-09 DIAGNOSIS — I83018 Varicose veins of right lower extremity with ulcer other part of lower leg: Secondary | ICD-10-CM | POA: Diagnosis not present

## 2019-08-09 DIAGNOSIS — E039 Hypothyroidism, unspecified: Secondary | ICD-10-CM | POA: Diagnosis not present

## 2019-08-09 DIAGNOSIS — I83028 Varicose veins of left lower extremity with ulcer other part of lower leg: Secondary | ICD-10-CM | POA: Diagnosis not present

## 2019-08-09 DIAGNOSIS — I4891 Unspecified atrial fibrillation: Secondary | ICD-10-CM | POA: Diagnosis not present

## 2019-08-09 DIAGNOSIS — J449 Chronic obstructive pulmonary disease, unspecified: Secondary | ICD-10-CM | POA: Diagnosis not present

## 2019-08-09 DIAGNOSIS — E1122 Type 2 diabetes mellitus with diabetic chronic kidney disease: Secondary | ICD-10-CM | POA: Diagnosis not present

## 2019-08-09 DIAGNOSIS — J302 Other seasonal allergic rhinitis: Secondary | ICD-10-CM | POA: Diagnosis not present

## 2019-08-09 DIAGNOSIS — N183 Chronic kidney disease, stage 3 (moderate): Secondary | ICD-10-CM | POA: Diagnosis not present

## 2019-08-09 DIAGNOSIS — L97819 Non-pressure chronic ulcer of other part of right lower leg with unspecified severity: Secondary | ICD-10-CM | POA: Diagnosis not present

## 2019-08-09 DIAGNOSIS — I25119 Atherosclerotic heart disease of native coronary artery with unspecified angina pectoris: Secondary | ICD-10-CM | POA: Diagnosis not present

## 2019-08-09 DIAGNOSIS — G4733 Obstructive sleep apnea (adult) (pediatric): Secondary | ICD-10-CM | POA: Diagnosis not present

## 2019-08-09 DIAGNOSIS — H409 Unspecified glaucoma: Secondary | ICD-10-CM | POA: Diagnosis not present

## 2019-08-09 DIAGNOSIS — E1142 Type 2 diabetes mellitus with diabetic polyneuropathy: Secondary | ICD-10-CM | POA: Diagnosis not present

## 2019-08-09 DIAGNOSIS — I350 Nonrheumatic aortic (valve) stenosis: Secondary | ICD-10-CM | POA: Diagnosis not present

## 2019-08-09 DIAGNOSIS — K219 Gastro-esophageal reflux disease without esophagitis: Secondary | ICD-10-CM | POA: Diagnosis not present

## 2019-08-09 DIAGNOSIS — L97829 Non-pressure chronic ulcer of other part of left lower leg with unspecified severity: Secondary | ICD-10-CM | POA: Diagnosis not present

## 2019-08-14 DIAGNOSIS — E1122 Type 2 diabetes mellitus with diabetic chronic kidney disease: Secondary | ICD-10-CM | POA: Diagnosis not present

## 2019-08-14 DIAGNOSIS — I25119 Atherosclerotic heart disease of native coronary artery with unspecified angina pectoris: Secondary | ICD-10-CM | POA: Diagnosis not present

## 2019-08-14 DIAGNOSIS — I509 Heart failure, unspecified: Secondary | ICD-10-CM | POA: Diagnosis not present

## 2019-08-14 DIAGNOSIS — E669 Obesity, unspecified: Secondary | ICD-10-CM | POA: Diagnosis not present

## 2019-08-14 DIAGNOSIS — J449 Chronic obstructive pulmonary disease, unspecified: Secondary | ICD-10-CM | POA: Diagnosis not present

## 2019-08-14 DIAGNOSIS — I350 Nonrheumatic aortic (valve) stenosis: Secondary | ICD-10-CM | POA: Diagnosis not present

## 2019-08-16 ENCOUNTER — Ambulatory Visit (INDEPENDENT_AMBULATORY_CARE_PROVIDER_SITE_OTHER): Payer: Medicare Other | Admitting: *Deleted

## 2019-08-16 DIAGNOSIS — I509 Heart failure, unspecified: Secondary | ICD-10-CM | POA: Diagnosis not present

## 2019-08-16 DIAGNOSIS — E1122 Type 2 diabetes mellitus with diabetic chronic kidney disease: Secondary | ICD-10-CM | POA: Diagnosis not present

## 2019-08-16 DIAGNOSIS — I25119 Atherosclerotic heart disease of native coronary artery with unspecified angina pectoris: Secondary | ICD-10-CM | POA: Diagnosis not present

## 2019-08-16 DIAGNOSIS — J449 Chronic obstructive pulmonary disease, unspecified: Secondary | ICD-10-CM | POA: Diagnosis not present

## 2019-08-16 DIAGNOSIS — I350 Nonrheumatic aortic (valve) stenosis: Secondary | ICD-10-CM | POA: Diagnosis not present

## 2019-08-16 DIAGNOSIS — I495 Sick sinus syndrome: Secondary | ICD-10-CM | POA: Diagnosis not present

## 2019-08-16 DIAGNOSIS — E669 Obesity, unspecified: Secondary | ICD-10-CM | POA: Diagnosis not present

## 2019-08-16 LAB — CUP PACEART REMOTE DEVICE CHECK
Battery Remaining Longevity: 0 mo
Battery Voltage: 2.53 V
Brady Statistic AP VP Percent: 96 %
Brady Statistic AP VS Percent: 1.4 %
Brady Statistic AS VP Percent: 2.7 %
Brady Statistic AS VS Percent: 1 %
Brady Statistic RA Percent Paced: 89 %
Brady Statistic RV Percent Paced: 98 %
Date Time Interrogation Session: 20200908074440
Implantable Lead Implant Date: 20110705
Implantable Lead Implant Date: 20110705
Implantable Lead Location: 753859
Implantable Lead Location: 753860
Implantable Pulse Generator Implant Date: 20110705
Lead Channel Impedance Value: 330 Ohm
Lead Channel Impedance Value: 340 Ohm
Lead Channel Pacing Threshold Amplitude: 1 V
Lead Channel Pacing Threshold Amplitude: 1 V
Lead Channel Pacing Threshold Pulse Width: 0.5 ms
Lead Channel Pacing Threshold Pulse Width: 0.5 ms
Lead Channel Sensing Intrinsic Amplitude: 12 mV
Lead Channel Sensing Intrinsic Amplitude: 2.1 mV
Lead Channel Setting Pacing Amplitude: 1.25 V
Lead Channel Setting Pacing Amplitude: 2 V
Lead Channel Setting Pacing Pulse Width: 0.5 ms
Lead Channel Setting Sensing Sensitivity: 4 mV
Pulse Gen Model: 2210
Pulse Gen Serial Number: 7152830

## 2019-08-17 DIAGNOSIS — E1122 Type 2 diabetes mellitus with diabetic chronic kidney disease: Secondary | ICD-10-CM | POA: Diagnosis not present

## 2019-08-17 DIAGNOSIS — I25119 Atherosclerotic heart disease of native coronary artery with unspecified angina pectoris: Secondary | ICD-10-CM | POA: Diagnosis not present

## 2019-08-17 DIAGNOSIS — I509 Heart failure, unspecified: Secondary | ICD-10-CM | POA: Diagnosis not present

## 2019-08-17 DIAGNOSIS — I350 Nonrheumatic aortic (valve) stenosis: Secondary | ICD-10-CM | POA: Diagnosis not present

## 2019-08-17 DIAGNOSIS — E669 Obesity, unspecified: Secondary | ICD-10-CM | POA: Diagnosis not present

## 2019-08-17 DIAGNOSIS — J449 Chronic obstructive pulmonary disease, unspecified: Secondary | ICD-10-CM | POA: Diagnosis not present

## 2019-08-22 DIAGNOSIS — I25119 Atherosclerotic heart disease of native coronary artery with unspecified angina pectoris: Secondary | ICD-10-CM | POA: Diagnosis not present

## 2019-08-22 DIAGNOSIS — E1122 Type 2 diabetes mellitus with diabetic chronic kidney disease: Secondary | ICD-10-CM | POA: Diagnosis not present

## 2019-08-22 DIAGNOSIS — E669 Obesity, unspecified: Secondary | ICD-10-CM | POA: Diagnosis not present

## 2019-08-22 DIAGNOSIS — I509 Heart failure, unspecified: Secondary | ICD-10-CM | POA: Diagnosis not present

## 2019-08-22 DIAGNOSIS — I350 Nonrheumatic aortic (valve) stenosis: Secondary | ICD-10-CM | POA: Diagnosis not present

## 2019-08-22 DIAGNOSIS — J449 Chronic obstructive pulmonary disease, unspecified: Secondary | ICD-10-CM | POA: Diagnosis not present

## 2019-08-29 DIAGNOSIS — J449 Chronic obstructive pulmonary disease, unspecified: Secondary | ICD-10-CM | POA: Diagnosis not present

## 2019-08-29 DIAGNOSIS — E669 Obesity, unspecified: Secondary | ICD-10-CM | POA: Diagnosis not present

## 2019-08-29 DIAGNOSIS — I509 Heart failure, unspecified: Secondary | ICD-10-CM | POA: Diagnosis not present

## 2019-08-29 DIAGNOSIS — E1122 Type 2 diabetes mellitus with diabetic chronic kidney disease: Secondary | ICD-10-CM | POA: Diagnosis not present

## 2019-08-29 DIAGNOSIS — I25119 Atherosclerotic heart disease of native coronary artery with unspecified angina pectoris: Secondary | ICD-10-CM | POA: Diagnosis not present

## 2019-08-29 DIAGNOSIS — I350 Nonrheumatic aortic (valve) stenosis: Secondary | ICD-10-CM | POA: Diagnosis not present

## 2019-09-01 ENCOUNTER — Encounter: Payer: Self-pay | Admitting: Cardiology

## 2019-09-01 NOTE — Progress Notes (Signed)
Remote pacemaker transmission.   

## 2019-09-05 DIAGNOSIS — E1122 Type 2 diabetes mellitus with diabetic chronic kidney disease: Secondary | ICD-10-CM | POA: Diagnosis not present

## 2019-09-05 DIAGNOSIS — E669 Obesity, unspecified: Secondary | ICD-10-CM | POA: Diagnosis not present

## 2019-09-05 DIAGNOSIS — I25119 Atherosclerotic heart disease of native coronary artery with unspecified angina pectoris: Secondary | ICD-10-CM | POA: Diagnosis not present

## 2019-09-05 DIAGNOSIS — I350 Nonrheumatic aortic (valve) stenosis: Secondary | ICD-10-CM | POA: Diagnosis not present

## 2019-09-05 DIAGNOSIS — I509 Heart failure, unspecified: Secondary | ICD-10-CM | POA: Diagnosis not present

## 2019-09-05 DIAGNOSIS — J449 Chronic obstructive pulmonary disease, unspecified: Secondary | ICD-10-CM | POA: Diagnosis not present

## 2019-09-08 DIAGNOSIS — J302 Other seasonal allergic rhinitis: Secondary | ICD-10-CM | POA: Diagnosis not present

## 2019-09-08 DIAGNOSIS — H409 Unspecified glaucoma: Secondary | ICD-10-CM | POA: Diagnosis not present

## 2019-09-08 DIAGNOSIS — I25119 Atherosclerotic heart disease of native coronary artery with unspecified angina pectoris: Secondary | ICD-10-CM | POA: Diagnosis not present

## 2019-09-08 DIAGNOSIS — D631 Anemia in chronic kidney disease: Secondary | ICD-10-CM | POA: Diagnosis not present

## 2019-09-08 DIAGNOSIS — E669 Obesity, unspecified: Secondary | ICD-10-CM | POA: Diagnosis not present

## 2019-09-08 DIAGNOSIS — N183 Chronic kidney disease, stage 3 unspecified: Secondary | ICD-10-CM | POA: Diagnosis not present

## 2019-09-08 DIAGNOSIS — L97819 Non-pressure chronic ulcer of other part of right lower leg with unspecified severity: Secondary | ICD-10-CM | POA: Diagnosis not present

## 2019-09-08 DIAGNOSIS — I4891 Unspecified atrial fibrillation: Secondary | ICD-10-CM | POA: Diagnosis not present

## 2019-09-08 DIAGNOSIS — E11622 Type 2 diabetes mellitus with other skin ulcer: Secondary | ICD-10-CM | POA: Diagnosis not present

## 2019-09-08 DIAGNOSIS — E1122 Type 2 diabetes mellitus with diabetic chronic kidney disease: Secondary | ICD-10-CM | POA: Diagnosis not present

## 2019-09-08 DIAGNOSIS — I83018 Varicose veins of right lower extremity with ulcer other part of lower leg: Secondary | ICD-10-CM | POA: Diagnosis not present

## 2019-09-08 DIAGNOSIS — I509 Heart failure, unspecified: Secondary | ICD-10-CM | POA: Diagnosis not present

## 2019-09-08 DIAGNOSIS — Z9981 Dependence on supplemental oxygen: Secondary | ICD-10-CM | POA: Diagnosis not present

## 2019-09-08 DIAGNOSIS — G4733 Obstructive sleep apnea (adult) (pediatric): Secondary | ICD-10-CM | POA: Diagnosis not present

## 2019-09-08 DIAGNOSIS — L97829 Non-pressure chronic ulcer of other part of left lower leg with unspecified severity: Secondary | ICD-10-CM | POA: Diagnosis not present

## 2019-09-08 DIAGNOSIS — I350 Nonrheumatic aortic (valve) stenosis: Secondary | ICD-10-CM | POA: Diagnosis not present

## 2019-09-08 DIAGNOSIS — E039 Hypothyroidism, unspecified: Secondary | ICD-10-CM | POA: Diagnosis not present

## 2019-09-08 DIAGNOSIS — Z794 Long term (current) use of insulin: Secondary | ICD-10-CM | POA: Diagnosis not present

## 2019-09-08 DIAGNOSIS — E1142 Type 2 diabetes mellitus with diabetic polyneuropathy: Secondary | ICD-10-CM | POA: Diagnosis not present

## 2019-09-08 DIAGNOSIS — I83028 Varicose veins of left lower extremity with ulcer other part of lower leg: Secondary | ICD-10-CM | POA: Diagnosis not present

## 2019-09-08 DIAGNOSIS — E1159 Type 2 diabetes mellitus with other circulatory complications: Secondary | ICD-10-CM | POA: Diagnosis not present

## 2019-09-08 DIAGNOSIS — J449 Chronic obstructive pulmonary disease, unspecified: Secondary | ICD-10-CM | POA: Diagnosis not present

## 2019-09-08 DIAGNOSIS — K219 Gastro-esophageal reflux disease without esophagitis: Secondary | ICD-10-CM | POA: Diagnosis not present

## 2019-09-10 DIAGNOSIS — I25119 Atherosclerotic heart disease of native coronary artery with unspecified angina pectoris: Secondary | ICD-10-CM | POA: Diagnosis not present

## 2019-09-10 DIAGNOSIS — E1159 Type 2 diabetes mellitus with other circulatory complications: Secondary | ICD-10-CM | POA: Diagnosis not present

## 2019-09-10 DIAGNOSIS — J449 Chronic obstructive pulmonary disease, unspecified: Secondary | ICD-10-CM | POA: Diagnosis not present

## 2019-09-10 DIAGNOSIS — E669 Obesity, unspecified: Secondary | ICD-10-CM | POA: Diagnosis not present

## 2019-09-10 DIAGNOSIS — I350 Nonrheumatic aortic (valve) stenosis: Secondary | ICD-10-CM | POA: Diagnosis not present

## 2019-09-10 DIAGNOSIS — I509 Heart failure, unspecified: Secondary | ICD-10-CM | POA: Diagnosis not present

## 2019-09-12 ENCOUNTER — Other Ambulatory Visit: Payer: Self-pay | Admitting: Primary Care

## 2019-09-12 ENCOUNTER — Telehealth: Payer: Self-pay | Admitting: Primary Care

## 2019-09-12 DIAGNOSIS — E1122 Type 2 diabetes mellitus with diabetic chronic kidney disease: Secondary | ICD-10-CM

## 2019-09-12 DIAGNOSIS — I25119 Atherosclerotic heart disease of native coronary artery with unspecified angina pectoris: Secondary | ICD-10-CM | POA: Diagnosis not present

## 2019-09-12 DIAGNOSIS — I350 Nonrheumatic aortic (valve) stenosis: Secondary | ICD-10-CM | POA: Diagnosis not present

## 2019-09-12 DIAGNOSIS — D509 Iron deficiency anemia, unspecified: Secondary | ICD-10-CM

## 2019-09-12 DIAGNOSIS — I509 Heart failure, unspecified: Secondary | ICD-10-CM | POA: Diagnosis not present

## 2019-09-12 DIAGNOSIS — J449 Chronic obstructive pulmonary disease, unspecified: Secondary | ICD-10-CM | POA: Diagnosis not present

## 2019-09-12 DIAGNOSIS — E7800 Pure hypercholesterolemia, unspecified: Secondary | ICD-10-CM

## 2019-09-12 DIAGNOSIS — E669 Obesity, unspecified: Secondary | ICD-10-CM | POA: Diagnosis not present

## 2019-09-12 DIAGNOSIS — E1159 Type 2 diabetes mellitus with other circulatory complications: Secondary | ICD-10-CM | POA: Diagnosis not present

## 2019-09-12 DIAGNOSIS — R0600 Dyspnea, unspecified: Secondary | ICD-10-CM

## 2019-09-12 NOTE — Telephone Encounter (Signed)
Noted.  This is very reasonable, I approve. [name], see below.

## 2019-09-12 NOTE — Telephone Encounter (Signed)
[name], Hospice nurse with [facility] is calling in regards to getting an order  She stated that the patient is unable to come to the office to get his flu shot.  She stated that they would like an order for the hospice nurse to come pick up the vaccine from our office and administer it at the patient's home   Call back #   [phone]

## 2019-09-13 NOTE — Telephone Encounter (Signed)
Message left for [name] to return my call.

## 2019-09-14 NOTE — Telephone Encounter (Signed)
[name] left v/m returning call and requesting cb about flu shot.

## 2019-09-14 NOTE — Telephone Encounter (Signed)
Spoken to [name] and inform her that she may come by and pick the flu shot. Will need her to call back when completed.

## 2019-09-15 NOTE — Telephone Encounter (Signed)
Message left for[place]into return my call.

## 2019-09-15 NOTE — Telephone Encounter (Signed)
Message left for [name] to return my call.  Were told that we are unable to provide [name] a flu shot from the office. Patient will need to go to a pharmacy.

## 2019-09-16 NOTE — Telephone Encounter (Signed)
[name] has spoken to [place] this morning and given the information below.

## 2019-09-19 DIAGNOSIS — I509 Heart failure, unspecified: Secondary | ICD-10-CM | POA: Diagnosis not present

## 2019-09-19 DIAGNOSIS — I350 Nonrheumatic aortic (valve) stenosis: Secondary | ICD-10-CM | POA: Diagnosis not present

## 2019-09-19 DIAGNOSIS — E669 Obesity, unspecified: Secondary | ICD-10-CM | POA: Diagnosis not present

## 2019-09-19 DIAGNOSIS — J449 Chronic obstructive pulmonary disease, unspecified: Secondary | ICD-10-CM | POA: Diagnosis not present

## 2019-09-19 DIAGNOSIS — I25119 Atherosclerotic heart disease of native coronary artery with unspecified angina pectoris: Secondary | ICD-10-CM | POA: Diagnosis not present

## 2019-09-19 DIAGNOSIS — E1159 Type 2 diabetes mellitus with other circulatory complications: Secondary | ICD-10-CM | POA: Diagnosis not present

## 2019-09-23 DIAGNOSIS — I350 Nonrheumatic aortic (valve) stenosis: Secondary | ICD-10-CM | POA: Diagnosis not present

## 2019-09-23 DIAGNOSIS — I25119 Atherosclerotic heart disease of native coronary artery with unspecified angina pectoris: Secondary | ICD-10-CM | POA: Diagnosis not present

## 2019-09-23 DIAGNOSIS — J449 Chronic obstructive pulmonary disease, unspecified: Secondary | ICD-10-CM | POA: Diagnosis not present

## 2019-09-23 DIAGNOSIS — E1159 Type 2 diabetes mellitus with other circulatory complications: Secondary | ICD-10-CM | POA: Diagnosis not present

## 2019-09-23 DIAGNOSIS — I509 Heart failure, unspecified: Secondary | ICD-10-CM | POA: Diagnosis not present

## 2019-09-23 DIAGNOSIS — E669 Obesity, unspecified: Secondary | ICD-10-CM | POA: Diagnosis not present

## 2019-09-26 DIAGNOSIS — E1159 Type 2 diabetes mellitus with other circulatory complications: Secondary | ICD-10-CM | POA: Diagnosis not present

## 2019-09-26 DIAGNOSIS — J449 Chronic obstructive pulmonary disease, unspecified: Secondary | ICD-10-CM | POA: Diagnosis not present

## 2019-09-26 DIAGNOSIS — I25119 Atherosclerotic heart disease of native coronary artery with unspecified angina pectoris: Secondary | ICD-10-CM | POA: Diagnosis not present

## 2019-09-26 DIAGNOSIS — I509 Heart failure, unspecified: Secondary | ICD-10-CM | POA: Diagnosis not present

## 2019-09-26 DIAGNOSIS — E669 Obesity, unspecified: Secondary | ICD-10-CM | POA: Diagnosis not present

## 2019-09-26 DIAGNOSIS — I350 Nonrheumatic aortic (valve) stenosis: Secondary | ICD-10-CM | POA: Diagnosis not present

## 2019-09-28 ENCOUNTER — Telehealth: Payer: Self-pay | Admitting: *Deleted

## 2019-09-28 DIAGNOSIS — J449 Chronic obstructive pulmonary disease, unspecified: Secondary | ICD-10-CM | POA: Diagnosis not present

## 2019-09-28 DIAGNOSIS — E1159 Type 2 diabetes mellitus with other circulatory complications: Secondary | ICD-10-CM | POA: Diagnosis not present

## 2019-09-28 DIAGNOSIS — I350 Nonrheumatic aortic (valve) stenosis: Secondary | ICD-10-CM | POA: Diagnosis not present

## 2019-09-28 DIAGNOSIS — E669 Obesity, unspecified: Secondary | ICD-10-CM | POA: Diagnosis not present

## 2019-09-28 DIAGNOSIS — I509 Heart failure, unspecified: Secondary | ICD-10-CM | POA: Diagnosis not present

## 2019-09-28 DIAGNOSIS — I25119 Atherosclerotic heart disease of native coronary artery with unspecified angina pectoris: Secondary | ICD-10-CM | POA: Diagnosis not present

## 2019-09-28 NOTE — Telephone Encounter (Signed)
Left message on voicemail for [name] to call back.

## 2019-09-28 NOTE — Telephone Encounter (Signed)
[name] nurse with Hospice called stating that patient received a letter from Allie Bossier NP that he would need an office visit prior to any further refills. [name] stated that patient is under hospice care and not able to come to the office. [name] stated that the family does not have any device to do a virtual visit, but she has an Ipad that she can let them use for this. [name] stated that this is a unique situation and wants to know if a virtual visit is okay? [name] stated the medications that he needs is not covered under hospice.

## 2019-09-28 NOTE — Telephone Encounter (Signed)
I am happy to complete a virtual visit for follow up.  Thanks!

## 2019-09-28 NOTE — Telephone Encounter (Signed)
[name] notified as instructed by telephone and verbalized understanding.Virtual visit scheduled with Allie Bossier NP on Monday 10/03/19 at 11:40. [name] stated that will work well for her. [name] stated that she will get patient's vital signs prior to the virtual visit that day.Marland Kitchen

## 2019-10-03 ENCOUNTER — Ambulatory Visit (INDEPENDENT_AMBULATORY_CARE_PROVIDER_SITE_OTHER): Payer: Medicare Other | Admitting: Primary Care

## 2019-10-03 ENCOUNTER — Encounter: Payer: Self-pay | Admitting: Primary Care

## 2019-10-03 ENCOUNTER — Telehealth: Payer: Self-pay | Admitting: Primary Care

## 2019-10-03 ENCOUNTER — Encounter: Payer: Medicare HMO | Admitting: *Deleted

## 2019-10-03 DIAGNOSIS — I2510 Atherosclerotic heart disease of native coronary artery without angina pectoris: Secondary | ICD-10-CM

## 2019-10-03 DIAGNOSIS — Z794 Long term (current) use of insulin: Secondary | ICD-10-CM | POA: Diagnosis not present

## 2019-10-03 DIAGNOSIS — I350 Nonrheumatic aortic (valve) stenosis: Secondary | ICD-10-CM | POA: Diagnosis not present

## 2019-10-03 DIAGNOSIS — N183 Chronic kidney disease, stage 3 unspecified: Secondary | ICD-10-CM | POA: Diagnosis not present

## 2019-10-03 DIAGNOSIS — I509 Heart failure, unspecified: Secondary | ICD-10-CM | POA: Diagnosis not present

## 2019-10-03 DIAGNOSIS — K219 Gastro-esophageal reflux disease without esophagitis: Secondary | ICD-10-CM | POA: Diagnosis not present

## 2019-10-03 DIAGNOSIS — J449 Chronic obstructive pulmonary disease, unspecified: Secondary | ICD-10-CM | POA: Diagnosis not present

## 2019-10-03 DIAGNOSIS — I5032 Chronic diastolic (congestive) heart failure: Secondary | ICD-10-CM

## 2019-10-03 DIAGNOSIS — I130 Hypertensive heart and chronic kidney disease with heart failure and stage 1 through stage 4 chronic kidney disease, or unspecified chronic kidney disease: Secondary | ICD-10-CM | POA: Diagnosis not present

## 2019-10-03 DIAGNOSIS — H401133 Primary open-angle glaucoma, bilateral, severe stage: Secondary | ICD-10-CM

## 2019-10-03 DIAGNOSIS — E1169 Type 2 diabetes mellitus with other specified complication: Secondary | ICD-10-CM | POA: Diagnosis not present

## 2019-10-03 DIAGNOSIS — I480 Paroxysmal atrial fibrillation: Secondary | ICD-10-CM | POA: Diagnosis not present

## 2019-10-03 DIAGNOSIS — I25119 Atherosclerotic heart disease of native coronary artery with unspecified angina pectoris: Secondary | ICD-10-CM | POA: Diagnosis not present

## 2019-10-03 DIAGNOSIS — E7800 Pure hypercholesterolemia, unspecified: Secondary | ICD-10-CM

## 2019-10-03 DIAGNOSIS — E1159 Type 2 diabetes mellitus with other circulatory complications: Secondary | ICD-10-CM | POA: Diagnosis not present

## 2019-10-03 DIAGNOSIS — I495 Sick sinus syndrome: Secondary | ICD-10-CM | POA: Diagnosis not present

## 2019-10-03 DIAGNOSIS — E669 Obesity, unspecified: Secondary | ICD-10-CM | POA: Diagnosis not present

## 2019-10-03 NOTE — Telephone Encounter (Signed)
[name], this patient is on hospice care and is unable to leave home. They need to provide him with a flu shot and were asking for the best way to complete. I believe there was some discussion regarding taking one of ours but I'm not sure if that's allowed.  He is under Hospice care and his nurse is coordinating this. There are several phone notes regarding this.

## 2019-10-03 NOTE — Assessment & Plan Note (Signed)
Patient's hospice nurse will provide [place] with updated vitals and repeat labs. Continue current regimen.

## 2019-10-03 NOTE — Assessment & Plan Note (Signed)
Repeat lipids pending, compliant to rosuvastatin.

## 2019-10-03 NOTE — Assessment & Plan Note (Signed)
Following with cardiology and electrophysiology. Pacemaker in place. Compliant to current regimen. Continue same.

## 2019-10-03 NOTE — Assessment & Plan Note (Signed)
Doing well on omeprazole 40 mg, continue same.  

## 2019-10-03 NOTE — Assessment & Plan Note (Signed)
Not a surgical candidate for repair. Following with hospice and cardiology.

## 2019-10-03 NOTE — Assessment & Plan Note (Signed)
Pacemaker in place, following with cardiology.

## 2019-10-03 NOTE — Assessment & Plan Note (Signed)
Compliant to anticoagulant and beta blocker therapy, continue same. Unable to auscultate for heart sounds given phone visit.

## 2019-10-03 NOTE — Patient Instructions (Signed)
Please have the hospice nurse call us for requested labs.  Continue to follow with cardiology and hospice.  It was a pleasure to see you today!

## 2019-10-03 NOTE — Assessment & Plan Note (Signed)
Doing well on PRN albuterol. Continue same.

## 2019-10-03 NOTE — Assessment & Plan Note (Signed)
Following with Hospice MD, injecting now 500-600 units daily. Continue same.

## 2019-10-03 NOTE — Assessment & Plan Note (Signed)
Following with ophthalmology, compliant to prescribed drops. Continue same.

## 2019-10-03 NOTE — Assessment & Plan Note (Signed)
Repeat lipids pending. Continue rosuvastatin.

## 2019-10-03 NOTE — Assessment & Plan Note (Signed)
Repeat BMP pending. 

## 2019-10-03 NOTE — Progress Notes (Signed)
Subjective:    Patient ID: [name], male    DOB: 29-Sep-1941, 78 y.o.   MRN: [id number]  HPI     [name] - 78 y.o. male  MRN [id number]  Date of Birth: 1941-05-15  PCP: Pleas Koch, NP  This service was provided via telemedicine. Phone Visit performed on 10/03/2019    Rationale for phone visit along with limitations reviewed. Patient consented to telephone encounter.    Location of patient: Home Location of provider: Office at [employer] @ [facility] Name of referring provider: N/A   Names of persons and role in encounter: Provider: Pleas Koch, NP  Patient: [name]  Other: N/A   Time on call: 15 min - 05 sec   Subjective: No chief complaint on file.    HPI:  [name] is a 78 year old male who presents today for follow up of chronic conditions. I am speaking with his wife as he is very hard of hearing.  He is currently managed per Hospice for most of his chronic conditions. He does not leave the house due to inability to travel.   He is managed by cardiology for CHF, sick sinus syndrome, aortic stenosis, heart block, atrial fibrillation. Pacemaker in place. Compliant to all prescribed medications.   He is managed for his diabetes per the Hospice physician. He is using Novolin R 500-600 units daily.  He is compliant to his levothyroxine 75 mcg every morning with water. He doesn't take any other medications or eat for one hour. He takes his omeprazole at breakfast.   He will be visited by the Hospice nurse today who can draw labs. He is needing refills of his levothyroxine and rosuvastatin.   He has no complaints today.   Objective/Observations:  No physical exam or vital signs collected unless specifically identified below.   There were no vitals taken for this visit.   Respiratory status: speaks in complete sentences without evident shortness of breath.   Assessment/Plan:  See problem based charting.  No problem-specific  Assessment & Plan notes found for this encounter.   I discussed the assessment and treatment plan with the patient. The patient was provided an opportunity to ask questions and all were answered. The patient agreed with the plan and demonstrated an understanding of the instructions.  Lab Orders  No laboratory test(s) ordered today    No orders of the defined types were placed in this encounter.   The patient was advised to call back or seek an in-person evaluation if the symptoms worsen or if the condition fails to improve as anticipated.  Pleas Koch, NP    Review of Systems  Constitutional: Negative for fever.  Respiratory: Negative for shortness of breath.        No recent use of albuterol   Cardiovascular: Negative for chest pain.  Gastrointestinal: Negative for constipation and diarrhea.  Genitourinary: Negative for hematuria.  Neurological: Negative for dizziness and headaches.  Psychiatric/Behavioral: The patient is not nervous/anxious.        Past Medical History:  Diagnosis Date  . Bladder cancer ([place]) 2014  . COPD (chronic obstructive pulmonary disease) (H[facility])    CPAP  . Depression   . Diabetes mellitus    TypeII  . Diastolic dysfunction with chronic heart failure ([place])   . GERD (gastroesophageal reflux disease)   . H/O Legionnaire's disease 2003  . Hyperlipemia   . Hypothyroidism   . Morbid obesity ([place])   .  OSA on CPAP   . Osteoarthritis    fingers  . Pancreatitis   . Peripheral neuropathy   . Persistent atrial fibrillation   . Renal insufficiency   . Sick sinus syndrome ([place]) 06/2010   s/p PPM by [employer]  . Venous insufficiency      Social History   Socioeconomic History  . Marital status: Married    Spouse name: Not on file  . Number of children: Not on file  . Years of education: Not on file  . Highest education level: Not on file  Occupational History  . Occupation: retired     Fish farm manager: RETIRED    Comment: Insurance account manager  . Financial resource strain: Not on file  . Food insecurity    Worry: Not on file    Inability: Not on file  . Transportation needs    Medical: Not on file    Non-medical: Not on file  Tobacco Use  . Smoking status: Former Smoker    Packs/day: 2.00    Years: 50.00    Pack years: 100.00    Types: Cigarettes    Quit date: 12/08/2006    Years since quitting: 12.8  . Smokeless tobacco: Never Used  Substance and Sexual Activity  . Alcohol use: No    Alcohol/week: 0.0 standard drinks    Comment: quit 3 years ago  . Drug use: No  . Sexual activity: Not Currently  Lifestyle  . Physical activity    Days per week: Not on file    Minutes per session: Not on file  . Stress: Not on file  Relationships  . Social Herbalist on phone: Not on file    Gets together: Not on file    Attends religious service: Not on file    Active member of club or organization: Not on file    Attends meetings of clubs or organizations: Not on file    Relationship status: Not on file  . Intimate partner violence    Fear of current or ex partner: Not on file    Emotionally abused: Not on file    Physically abused: Not on file    Forced sexual activity: Not on file  Other Topics Concern  . Not on file  Social History Narrative  . Not on file    Past Surgical History:  Procedure Laterality Date  . BELPHAROPTOSIS REPAIR Right    Glaucoma  . CARDIAC CATHETERIZATION     11-02-13  . CARDIOVERSION N/A 05/16/2014   Procedure: CARDIOVERSION;  Surgeon: Josue Hector, MD;  Location: [facility] END[facility]COPY;  Service: Cardiovascular;  Laterality: N/A;  . CARDIOVERSION N/A 11/08/2014   Procedure: CARDIOVERSION;  Surgeon: Fay Records, MD;  Location: [facility];  Service: Cardiovascular;  Laterality: N/A;  . CARDIOVERSION N/A 06/22/2015   Procedure: CARDIOVERSION;  Surgeon: Jerline Pain, MD;  Location: [facility];  Service: Cardiovascular;  Laterality: N/A;  . CHOLECYSTECTOMY  2012  .  CYSTOSCOPY W/ URETERAL STENT PLACEMENT Right 10/26/2013   Procedure: CYSTOSCOPY WITH RETROGRADE PYELOGRAM/URETERAL STENT PLACEMENT;  Surgeon: Alexis Frock, MD;  [facility];  Service: Urology;  Laterality: Right;  . CYSTOSCOPY WITH URETEROSCOPY AND STENT PLACEMENT Right 01/11/2014   Procedure: CYSTOSCOPY WITH URETEROSCOPY ,RIGHT RETROGRADE AND STENT CHANGE AND LASER OF URETERAL TUMOR;  Surgeon: Alexis Frock, MD;  [facility];  Service: Urology;  Laterality: Right;  . CYSTOSCOPY/RETROGRADE/URETEROSCOPY Right 08/23/2014   Procedure: CYSTOSCOPY/RETROGRADE/ DIAGNOSTIC URETEROSCOPY/RIGHT RENAL STONE EXTRACTION;  Surgeon: Alexis Frock, MD;  [facility];  Service: Urology;  Laterality: Right;  . EMBOLECTOMY Left 11/02/2013   Procedure: LEFT FEMORAL EMBOLECTOMY, LEFT FEMORAL ARTERY ENDARTERECTOMY WITH DACRON PATCH ANGIOPLASTY.;  Surgeon: Mal Misty, MD;  Location: [place] [facility];  Service: Vascular;  Laterality: Left;  . EYE SURGERY Left    cataract  . GROIN DEBRIDEMENT Right 05/08/2015   Procedure: REMOVAL OF RIGHT GROIN MASS;  Surgeon: Angelia Mould, MD;  Location: [place];  Service: Vascular;  Laterality: Right;  . HOLMIUM LASER APPLICATION Right 01/15/3661   Procedure: HOLMIUM LASER APPLICATION;  Surgeon: Alexis Frock, MD;  [facility];  Service: Urology;  Laterality: Right;  . INSERT / REPLACE / REMOVE PACEMAKER  2011  . NASAL SEPTUM SURGERY  1967  . PACEMAKER PLACEMENT  06/2010   [facility] Accent RF DR, Model [id number] ( Serial number [id number])  . TEE WITHOUT CARDIOVERSION N/A 05/16/2014   Procedure: TRANSESOPHAGEAL ECHOCARDIOGRAM (TEE);  Surgeon: Josue Hector, MD;  Location[facility] ENDOSCOPY;  Service: Cardiovascular;  Laterality: N/A;  . thromboembolectomy and four compartment fasciotomy Right 2009   leg  . TRANSURETHRAL RESECTION OF BLADDER TUMOR WITH GYRUS (TURBT-GYRUS) N/A 10/26/2013   Procedure: TRANSURETHRAL RESECTION OF BLADDER TUMOR WITH GYRUS (TURBT-GYRUS);   Surgeon: Alexis Frock, MD;  [facility];  Service: Urology;  Laterality: N/A;  . TRANSURETHRAL RESECTION OF BLADDER TUMOR WITH GYRUS (TURBT-GYRUS) N/A 01/11/2014   Procedure: TRANSURETHRAL RESECTION OF BLADDER TUMOR WITH GYRUS (TURBT-GYRUS);  Surgeon: Alexis Frock, MD;  [facility];  Service: Urology;  Laterality: N/A;  . WISDOM TOOTH EXTRACTION      Family History  Problem Relation Age of Onset  . Liver cancer Mother        deceased age 60  . Cancer Mother        liver cancer  . Heart attack Father   . Colon cancer Neg Hx     Allergies  Allergen Reactions  . Actos [Pioglitazone] Swelling  . Timolol Other (See Comments)    Slow heart rate but tolerates Cosopt (dorzolamide-timolol) Pt has a rx for Timolol and uses it for glaucoma   . Ceftriaxone Rash    Current Outpatient Medications on File Prior to Visit  Medication Sig Dispense Refill  . acetaminophen (TYLENOL) 500 MG tablet Take 1,000 mg by mouth 2 (two) times daily as needed (pain/headache).     Marland Kitchen albuterol (VENTOLIN HFA) 108 (90 Base) MCG/ACT inhaler Inhale 2 puffs into the lungs every 6 (six) hours as needed for wheezing or shortness of breath. 18 Inhaler 1  . amiodarone (PACERONE) 200 MG tablet TAKE 1 TABLET EVERY DAY 30 tablet 2  . Ascorbic Acid (VITAMIN C) 500 MG tablet Take 500 mg by mouth daily.     . Blood Glucose Monitoring Suppl (ACCU-CHEK AVIVA CONNECT) w/Device KIT 1 Package by Does not apply route 2 (two) times daily. 1 kit 0  . brimonidine (ALPHAGAN P) 0.1 % SOLN Place 1 drop into the left eye 3 (three) times daily.     . cholecalciferol (VITAMIN D) 1000 UNITS tablet Take 1,000 Units by mouth daily.     Marland Kitchen CINNAMON PO Take 2,000 mg by mouth daily.    Marland Kitchen dextromethorphan (DELSYM) 30 MG/5ML liquid Take 90 mg by mouth 2 (two) times daily as needed for cough.    . Digestive Enzymes (MULTI-ENZYME) TABS Take 1 tablet by mouth daily.    Marland Kitchen docusate sodium (COLACE) 100 MG capsule Take 100-200 mg by  mouth daily  as needed for mild constipation.     . dorzolamide (TRUSOPT) 2 % ophthalmic solution Place 1 drop into both eyes 2 (two) times daily.    Marland Kitchen ELIQUIS 5 MG TABS tablet TAKE 1 TABLET TWICE DAILY 180 tablet 1  . fluticasone (FLONASE) 50 MCG/ACT nasal spray Place 2 sprays into both nostrils as needed (congestion).     . furosemide (LASIX) 40 MG tablet Take 1 tablet (40 mg total) by mouth 2 (two) times daily. 180 tablet 1  . glucose blood test strip Use as instructed to check blood sugar twice daily E11.69 100 each 12  . guaiFENesin 200 MG tablet Take 400 mg by mouth 2 (two) times daily. 470m in the morning and 6041min the evening     . insulin regular (NOVOLIN R RELION) 100 units/mL injection 4 times daily before meals 300-210-210-190 units 900 mL 3  . Lactobacillus (ACIDOPHILUS) CAPS capsule Take 1 capsule by mouth every evening.     . Lancets (ACCU-CHEK SOFT TOUCH) lancets Use as instructed to check blood sugar twice daily E11.69 100 each 12  . levothyroxine (SYNTHROID) 75 MCG tablet TAKE 1 TABLET EVERY MORNING ON AN EMPTY STOMACH WITH WATER ONLY. NO FOOD OR OTHER MEDICATIONS FOR 30 MINUTES. 90 tablet 0  . metoprolol tartrate (LOPRESSOR) 25 MG tablet Take 1 tablet (25 mg total) by mouth 2 (two) times daily. 180 tablet 1  . Multiple Vitamin (MULTIVITAMIN WITH MINERALS) TABS tablet Take 1 tablet by mouth every evening.     . nitroGLYCERIN (NITROSTAT) 0.4 MG SL tablet Place 1 tablet (0.4 mg total) under the tongue every 5 (five) minutes as needed for chest pain. Please make yearly appt with Dr. AlRayann Hemanor July before anymore refills. 1st attempt 25 tablet 1  . Omega-3 Fatty Acids (FISH OIL) 1000 MG CAPS Take 1,000 mg by mouth daily.     . Marland Kitchenmeprazole (PRILOSEC) 40 MG capsule Take 1 capsule (40 mg total) by mouth daily. For heartburn. 90 capsule 3  . rosuvastatin (CRESTOR) 40 MG tablet TAKE 1/2 TABLET (20 MG TOTAL) BY MOUTH EVERY EVENING. FOR CHOLESTEROL. 45 tablet 0  . Timolol Maleate 0.5 % (DAILY) SOLN  Place 1 drop into both eyes 2 (two) times daily.    . traMADol (ULTRAM) 50 MG tablet Take 2 tablets (100 mg total) by mouth 3 (three) times daily as needed. 30 tablet 1  . travoprost, benzalkonium, (TRAVATAN) 0.004 % ophthalmic solution Place 1 drop into the left eye at bedtime.     No current facility-administered medications on file prior to visit.     There were no vitals taken for this visit.   Objective:   Physical Exam  Constitutional: He is oriented to person, place, and time.  Respiratory: Effort normal.  Neurological: He is alert and oriented to person, place, and time.  Psychiatric: He has a normal mood and affect.           Assessment & Plan:

## 2019-10-04 ENCOUNTER — Telehealth: Payer: Self-pay | Admitting: *Deleted

## 2019-10-04 DIAGNOSIS — I130 Hypertensive heart and chronic kidney disease with heart failure and stage 1 through stage 4 chronic kidney disease, or unspecified chronic kidney disease: Secondary | ICD-10-CM

## 2019-10-04 DIAGNOSIS — E7800 Pure hypercholesterolemia, unspecified: Secondary | ICD-10-CM

## 2019-10-04 DIAGNOSIS — E039 Hypothyroidism, unspecified: Secondary | ICD-10-CM

## 2019-10-04 NOTE — Telephone Encounter (Signed)
Please notify Crystal that I need updated labs for patient: TSH, Lipids, CMP. Can she draw? Also please address the flu shot options as the family was asking yesterday.

## 2019-10-04 NOTE — Telephone Encounter (Addendum)
[name] Monarch, RN with [facility], left VM at [employer]. She said yesterday she was suppose to use her phone to do a virtual visit with pt since the family doesn't have the right electronics/technology to do a virtual visit, Crystal said that the phone call was made to the family not her. Crystal still wanted pt to the virtual visit using her phone and request a call back to discuss this   [place] # [phone]

## 2019-10-04 NOTE — Telephone Encounter (Signed)
The [phone] was in the visit info note which I have called before I called the home number. I have change this the note due to speaking to the patient's wife that we did a phone call instead.

## 2019-10-05 ENCOUNTER — Telehealth: Payer: Self-pay | Admitting: Primary Care

## 2019-10-05 DIAGNOSIS — R404 Transient alteration of awareness: Secondary | ICD-10-CM | POA: Diagnosis not present

## 2019-10-05 DIAGNOSIS — I959 Hypotension, unspecified: Secondary | ICD-10-CM | POA: Diagnosis not present

## 2019-10-05 DIAGNOSIS — E161 Other hypoglycemia: Secondary | ICD-10-CM | POA: Diagnosis not present

## 2019-10-05 DIAGNOSIS — R0602 Shortness of breath: Secondary | ICD-10-CM | POA: Diagnosis not present

## 2019-10-05 DIAGNOSIS — E162 Hypoglycemia, unspecified: Secondary | ICD-10-CM | POA: Diagnosis not present

## 2019-10-05 NOTE — Telephone Encounter (Signed)
Noted, labs ordered and released.

## 2019-10-05 NOTE — Telephone Encounter (Signed)
[name],[facility], has been trying to set up a virtual appointment with patient. [name]'s asking for Vallarie Mare to call, Linda,(pt's wife) to set up date and time for virtual visit. [name] keeps [employer] while she's with other patients to schedule appointment. [name] will do lab work, but she needs to know what labs Anda Kraft needs for her to draw. [name] said Linda's asking about flu shot,also.

## 2019-10-05 NOTE — Telephone Encounter (Signed)
Could you order lab to [facility] and release so I can give the lab orders for pick up?

## 2019-10-05 NOTE — Telephone Encounter (Signed)
Message left for [name] to return my call.

## 2019-10-06 NOTE — Telephone Encounter (Signed)
Tried to call patient's wife [name]) but could not get through. Message stated that call cannot go through

## 2019-10-07 DIAGNOSIS — I350 Nonrheumatic aortic (valve) stenosis: Secondary | ICD-10-CM | POA: Diagnosis not present

## 2019-10-07 DIAGNOSIS — E1159 Type 2 diabetes mellitus with other circulatory complications: Secondary | ICD-10-CM | POA: Diagnosis not present

## 2019-10-07 DIAGNOSIS — I25119 Atherosclerotic heart disease of native coronary artery with unspecified angina pectoris: Secondary | ICD-10-CM | POA: Diagnosis not present

## 2019-10-07 DIAGNOSIS — J449 Chronic obstructive pulmonary disease, unspecified: Secondary | ICD-10-CM | POA: Diagnosis not present

## 2019-10-07 DIAGNOSIS — E7800 Pure hypercholesterolemia, unspecified: Secondary | ICD-10-CM | POA: Diagnosis not present

## 2019-10-07 DIAGNOSIS — E669 Obesity, unspecified: Secondary | ICD-10-CM | POA: Diagnosis not present

## 2019-10-07 DIAGNOSIS — I130 Hypertensive heart and chronic kidney disease with heart failure and stage 1 through stage 4 chronic kidney disease, or unspecified chronic kidney disease: Secondary | ICD-10-CM | POA: Diagnosis not present

## 2019-10-07 DIAGNOSIS — E039 Hypothyroidism, unspecified: Secondary | ICD-10-CM | POA: Diagnosis not present

## 2019-10-07 DIAGNOSIS — I509 Heart failure, unspecified: Secondary | ICD-10-CM | POA: Diagnosis not present

## 2019-10-08 ENCOUNTER — Telehealth: Payer: Self-pay | Admitting: Family Medicine

## 2019-10-08 LAB — COMPREHENSIVE METABOLIC PANEL
ALT: 63 IU/L — ABNORMAL HIGH (ref 0–44)
AST: 61 IU/L — ABNORMAL HIGH (ref 0–40)
Albumin/Globulin Ratio: 1 — ABNORMAL LOW (ref 1.2–2.2)
Albumin: 3.1 g/dL — ABNORMAL LOW (ref 3.7–4.7)
Alkaline Phosphatase: 266 IU/L — ABNORMAL HIGH (ref 39–117)
BUN/Creatinine Ratio: 16 (ref 10–24)
BUN: 38 mg/dL — ABNORMAL HIGH (ref 8–27)
Bilirubin Total: 0.5 mg/dL (ref 0.0–1.2)
CO2: 26 mmol/L (ref 20–29)
Calcium: 8.2 mg/dL — ABNORMAL LOW (ref 8.6–10.2)
Chloride: 91 mmol/L — ABNORMAL LOW (ref 96–106)
Creatinine, Ser: 2.36 mg/dL — ABNORMAL HIGH (ref 0.76–1.27)
GFR calc Af Amer: 30 mL/min/{1.73_m2} — ABNORMAL LOW (ref >59)
GFR calc non Af Amer: 26 mL/min/{1.73_m2} — ABNORMAL LOW (ref >59)
Globulin, Total: 3.1 g/dL (ref 1.5–4.5)
Glucose: 519 mg/dL (ref 65–99)
Potassium: 2.4 mmol/L — CL (ref 3.5–5.2)
Sodium: 132 mmol/L — ABNORMAL LOW (ref 134–144)
Total Protein: 6.2 g/dL (ref 6.0–8.5)

## 2019-10-08 LAB — LIPID PANEL
Chol/HDL Ratio: 2.6 ratio (ref 0.0–5.0)
Cholesterol, Total: 81 mg/dL — ABNORMAL LOW (ref 100–199)
HDL: 31 mg/dL — ABNORMAL LOW (ref >39)
LDL Chol Calc (NIH): 24 mg/dL (ref 0–99)
Triglycerides: 150 mg/dL — ABNORMAL HIGH (ref 0–149)
VLDL Cholesterol Cal: 26 mg/dL (ref 5–40)

## 2019-10-08 LAB — TSH: TSH: 6.12 u[IU]/mL — ABNORMAL HIGH (ref 0.450–4.500)

## 2019-10-08 NOTE — Telephone Encounter (Addendum)
Received at after hours call today at 445 am,  concerning a critical lab for [name]- patient of K.Carlis Abbott, NP. Potassium 2.4 and glucose 519. By EMR review, [name] seems to be under hospice care with [facility]. Per chart Hospice MD manages Diabetes.  I contacted [facility] to report critical lab, so they may re-evaluate and treat, which is dependent on patients specific hospice orders.   --------------------------------------- Janett Billow, RN from hospice care returned call at 6:00am and received critical lab information.

## 2019-10-09 ENCOUNTER — Other Ambulatory Visit: Payer: Self-pay

## 2019-10-09 ENCOUNTER — Emergency Department (HOSPITAL_COMMUNITY)

## 2019-10-09 ENCOUNTER — Encounter (HOSPITAL_COMMUNITY): Payer: Self-pay

## 2019-10-09 ENCOUNTER — Inpatient Hospital Stay (HOSPITAL_COMMUNITY)
Admission: EM | Admit: 2019-10-09 | Discharge: 2019-10-15 | DRG: 871 | Disposition: A | Discharge status: Hospice | Attending: Internal Medicine | Admitting: Internal Medicine

## 2019-10-09 ENCOUNTER — Other Ambulatory Visit: Payer: Self-pay | Admitting: Nurse Practitioner

## 2019-10-09 DIAGNOSIS — N390 Urinary tract infection, site not specified: Secondary | ICD-10-CM | POA: Diagnosis not present

## 2019-10-09 DIAGNOSIS — E785 Hyperlipidemia, unspecified: Secondary | ICD-10-CM | POA: Diagnosis present

## 2019-10-09 DIAGNOSIS — I4819 Other persistent atrial fibrillation: Secondary | ICD-10-CM | POA: Diagnosis not present

## 2019-10-09 DIAGNOSIS — I5020 Unspecified systolic (congestive) heart failure: Secondary | ICD-10-CM | POA: Diagnosis not present

## 2019-10-09 DIAGNOSIS — N189 Chronic kidney disease, unspecified: Secondary | ICD-10-CM | POA: Diagnosis not present

## 2019-10-09 DIAGNOSIS — E1122 Type 2 diabetes mellitus with diabetic chronic kidney disease: Secondary | ICD-10-CM | POA: Diagnosis present

## 2019-10-09 DIAGNOSIS — K219 Gastro-esophageal reflux disease without esophagitis: Secondary | ICD-10-CM | POA: Diagnosis present

## 2019-10-09 DIAGNOSIS — Z888 Allergy status to other drugs, medicaments and biological substances status: Secondary | ICD-10-CM

## 2019-10-09 DIAGNOSIS — E1165 Type 2 diabetes mellitus with hyperglycemia: Secondary | ICD-10-CM | POA: Diagnosis not present

## 2019-10-09 DIAGNOSIS — L97529 Non-pressure chronic ulcer of other part of left foot with unspecified severity: Secondary | ICD-10-CM | POA: Diagnosis present

## 2019-10-09 DIAGNOSIS — H401133 Primary open-angle glaucoma, bilateral, severe stage: Secondary | ICD-10-CM | POA: Diagnosis present

## 2019-10-09 DIAGNOSIS — N183 Chronic kidney disease, stage 3 unspecified: Secondary | ICD-10-CM | POA: Diagnosis not present

## 2019-10-09 DIAGNOSIS — M2550 Pain in unspecified joint: Secondary | ICD-10-CM | POA: Diagnosis not present

## 2019-10-09 DIAGNOSIS — E1142 Type 2 diabetes mellitus with diabetic polyneuropathy: Secondary | ICD-10-CM | POA: Diagnosis not present

## 2019-10-09 DIAGNOSIS — R4182 Altered mental status, unspecified: Secondary | ICD-10-CM

## 2019-10-09 DIAGNOSIS — Z515 Encounter for palliative care: Secondary | ICD-10-CM | POA: Diagnosis present

## 2019-10-09 DIAGNOSIS — Z03818 Encounter for observation for suspected exposure to other biological agents ruled out: Secondary | ICD-10-CM | POA: Diagnosis not present

## 2019-10-09 DIAGNOSIS — C679 Malignant neoplasm of bladder, unspecified: Secondary | ICD-10-CM | POA: Diagnosis not present

## 2019-10-09 DIAGNOSIS — I130 Hypertensive heart and chronic kidney disease with heart failure and stage 1 through stage 4 chronic kidney disease, or unspecified chronic kidney disease: Secondary | ICD-10-CM | POA: Diagnosis not present

## 2019-10-09 DIAGNOSIS — Z66 Do not resuscitate: Secondary | ICD-10-CM | POA: Diagnosis present

## 2019-10-09 DIAGNOSIS — E11621 Type 2 diabetes mellitus with foot ulcer: Secondary | ICD-10-CM | POA: Diagnosis present

## 2019-10-09 DIAGNOSIS — Z950 Presence of cardiac pacemaker: Secondary | ICD-10-CM

## 2019-10-09 DIAGNOSIS — I83018 Varicose veins of right lower extremity with ulcer other part of lower leg: Secondary | ICD-10-CM | POA: Diagnosis not present

## 2019-10-09 DIAGNOSIS — Z79899 Other long term (current) drug therapy: Secondary | ICD-10-CM

## 2019-10-09 DIAGNOSIS — L97509 Non-pressure chronic ulcer of other part of unspecified foot with unspecified severity: Secondary | ICD-10-CM | POA: Diagnosis not present

## 2019-10-09 DIAGNOSIS — G4733 Obstructive sleep apnea (adult) (pediatric): Secondary | ICD-10-CM | POA: Diagnosis not present

## 2019-10-09 DIAGNOSIS — E1100 Type 2 diabetes mellitus with hyperosmolarity without nonketotic hyperglycemic-hyperosmolar coma (NKHHC): Secondary | ICD-10-CM | POA: Diagnosis not present

## 2019-10-09 DIAGNOSIS — Z7189 Other specified counseling: Secondary | ICD-10-CM | POA: Diagnosis not present

## 2019-10-09 DIAGNOSIS — S90426A Blister (nonthermal), unspecified lesser toe(s), initial encounter: Secondary | ICD-10-CM

## 2019-10-09 DIAGNOSIS — I739 Peripheral vascular disease, unspecified: Secondary | ICD-10-CM | POA: Diagnosis not present

## 2019-10-09 DIAGNOSIS — A419 Sepsis, unspecified organism: Principal | ICD-10-CM | POA: Diagnosis present

## 2019-10-09 DIAGNOSIS — E669 Obesity, unspecified: Secondary | ICD-10-CM | POA: Diagnosis not present

## 2019-10-09 DIAGNOSIS — E1136 Type 2 diabetes mellitus with diabetic cataract: Secondary | ICD-10-CM | POA: Diagnosis present

## 2019-10-09 DIAGNOSIS — I2510 Atherosclerotic heart disease of native coronary artery without angina pectoris: Secondary | ICD-10-CM | POA: Diagnosis not present

## 2019-10-09 DIAGNOSIS — J449 Chronic obstructive pulmonary disease, unspecified: Secondary | ICD-10-CM | POA: Diagnosis present

## 2019-10-09 DIAGNOSIS — E1159 Type 2 diabetes mellitus with other circulatory complications: Secondary | ICD-10-CM | POA: Diagnosis not present

## 2019-10-09 DIAGNOSIS — I25119 Atherosclerotic heart disease of native coronary artery with unspecified angina pectoris: Secondary | ICD-10-CM | POA: Diagnosis not present

## 2019-10-09 DIAGNOSIS — G92 Toxic encephalopathy: Secondary | ICD-10-CM | POA: Diagnosis present

## 2019-10-09 DIAGNOSIS — I4891 Unspecified atrial fibrillation: Secondary | ICD-10-CM | POA: Diagnosis not present

## 2019-10-09 DIAGNOSIS — J9610 Chronic respiratory failure, unspecified whether with hypoxia or hypercapnia: Secondary | ICD-10-CM | POA: Diagnosis not present

## 2019-10-09 DIAGNOSIS — E43 Unspecified severe protein-calorie malnutrition: Secondary | ICD-10-CM | POA: Diagnosis not present

## 2019-10-09 DIAGNOSIS — L97909 Non-pressure chronic ulcer of unspecified part of unspecified lower leg with unspecified severity: Secondary | ICD-10-CM | POA: Diagnosis not present

## 2019-10-09 DIAGNOSIS — Z20828 Contact with and (suspected) exposure to other viral communicable diseases: Secondary | ICD-10-CM | POA: Diagnosis not present

## 2019-10-09 DIAGNOSIS — E869 Volume depletion, unspecified: Secondary | ICD-10-CM | POA: Diagnosis not present

## 2019-10-09 DIAGNOSIS — E876 Hypokalemia: Secondary | ICD-10-CM | POA: Diagnosis not present

## 2019-10-09 DIAGNOSIS — E11622 Type 2 diabetes mellitus with other skin ulcer: Secondary | ICD-10-CM | POA: Diagnosis not present

## 2019-10-09 DIAGNOSIS — Z79890 Hormone replacement therapy (postmenopausal): Secondary | ICD-10-CM

## 2019-10-09 DIAGNOSIS — I83028 Varicose veins of left lower extremity with ulcer other part of lower leg: Secondary | ICD-10-CM | POA: Diagnosis not present

## 2019-10-09 DIAGNOSIS — L97829 Non-pressure chronic ulcer of other part of left lower leg with unspecified severity: Secondary | ICD-10-CM | POA: Diagnosis not present

## 2019-10-09 DIAGNOSIS — Z23 Encounter for immunization: Secondary | ICD-10-CM | POA: Diagnosis not present

## 2019-10-09 DIAGNOSIS — R0600 Dyspnea, unspecified: Secondary | ICD-10-CM | POA: Diagnosis not present

## 2019-10-09 DIAGNOSIS — L97519 Non-pressure chronic ulcer of other part of right foot with unspecified severity: Secondary | ICD-10-CM | POA: Diagnosis present

## 2019-10-09 DIAGNOSIS — R404 Transient alteration of awareness: Secondary | ICD-10-CM | POA: Diagnosis not present

## 2019-10-09 DIAGNOSIS — E1151 Type 2 diabetes mellitus with diabetic peripheral angiopathy without gangrene: Secondary | ICD-10-CM | POA: Diagnosis present

## 2019-10-09 DIAGNOSIS — M79671 Pain in right foot: Secondary | ICD-10-CM | POA: Diagnosis not present

## 2019-10-09 DIAGNOSIS — R6510 Systemic inflammatory response syndrome (SIRS) of non-infectious origin without acute organ dysfunction: Secondary | ICD-10-CM | POA: Diagnosis not present

## 2019-10-09 DIAGNOSIS — I872 Venous insufficiency (chronic) (peripheral): Secondary | ICD-10-CM | POA: Diagnosis present

## 2019-10-09 DIAGNOSIS — Z6836 Body mass index (BMI) 36.0-36.9, adult: Secondary | ICD-10-CM

## 2019-10-09 DIAGNOSIS — G9341 Metabolic encephalopathy: Secondary | ICD-10-CM | POA: Diagnosis not present

## 2019-10-09 DIAGNOSIS — E039 Hypothyroidism, unspecified: Secondary | ICD-10-CM | POA: Diagnosis present

## 2019-10-09 DIAGNOSIS — I350 Nonrheumatic aortic (valve) stenosis: Secondary | ICD-10-CM | POA: Diagnosis not present

## 2019-10-09 DIAGNOSIS — Z7401 Bed confinement status: Secondary | ICD-10-CM | POA: Diagnosis not present

## 2019-10-09 DIAGNOSIS — Z794 Long term (current) use of insulin: Secondary | ICD-10-CM

## 2019-10-09 DIAGNOSIS — Z7901 Long term (current) use of anticoagulants: Secondary | ICD-10-CM

## 2019-10-09 DIAGNOSIS — D631 Anemia in chronic kidney disease: Secondary | ICD-10-CM | POA: Diagnosis not present

## 2019-10-09 DIAGNOSIS — F329 Major depressive disorder, single episode, unspecified: Secondary | ICD-10-CM | POA: Diagnosis present

## 2019-10-09 DIAGNOSIS — I10 Essential (primary) hypertension: Secondary | ICD-10-CM | POA: Diagnosis not present

## 2019-10-09 DIAGNOSIS — N179 Acute kidney failure, unspecified: Secondary | ICD-10-CM | POA: Diagnosis not present

## 2019-10-09 DIAGNOSIS — I480 Paroxysmal atrial fibrillation: Secondary | ICD-10-CM | POA: Diagnosis not present

## 2019-10-09 DIAGNOSIS — E875 Hyperkalemia: Secondary | ICD-10-CM | POA: Diagnosis present

## 2019-10-09 DIAGNOSIS — Z87891 Personal history of nicotine dependence: Secondary | ICD-10-CM

## 2019-10-09 DIAGNOSIS — R5381 Other malaise: Secondary | ICD-10-CM | POA: Diagnosis not present

## 2019-10-09 DIAGNOSIS — Z8551 Personal history of malignant neoplasm of bladder: Secondary | ICD-10-CM

## 2019-10-09 DIAGNOSIS — I959 Hypotension, unspecified: Secondary | ICD-10-CM | POA: Diagnosis present

## 2019-10-09 DIAGNOSIS — J302 Other seasonal allergic rhinitis: Secondary | ICD-10-CM | POA: Diagnosis not present

## 2019-10-09 DIAGNOSIS — I509 Heart failure, unspecified: Secondary | ICD-10-CM | POA: Diagnosis not present

## 2019-10-09 DIAGNOSIS — L97819 Non-pressure chronic ulcer of other part of right lower leg with unspecified severity: Secondary | ICD-10-CM | POA: Diagnosis not present

## 2019-10-09 DIAGNOSIS — H409 Unspecified glaucoma: Secondary | ICD-10-CM | POA: Diagnosis not present

## 2019-10-09 DIAGNOSIS — Z9981 Dependence on supplemental oxygen: Secondary | ICD-10-CM | POA: Diagnosis not present

## 2019-10-09 LAB — URINALYSIS, ROUTINE W REFLEX MICROSCOPIC
Bilirubin Urine: NEGATIVE
Glucose, UA: 50 mg/dL — AB
Ketones, ur: NEGATIVE mg/dL
Nitrite: NEGATIVE
Protein, ur: 30 mg/dL — AB
RBC / HPF: >50 RBC/hpf — ABNORMAL HIGH (ref 0–5)
Specific Gravity, Urine: 1.008 (ref 1.005–1.030)
WBC, UA: >50 WBC/hpf — ABNORMAL HIGH (ref 0–5)
pH: 6 (ref 5.0–8.0)

## 2019-10-09 LAB — BASIC METABOLIC PANEL
Anion gap: 14 (ref 5–15)
BUN: 47 mg/dL — ABNORMAL HIGH (ref 8–23)
CO2: 32 mmol/L (ref 22–32)
Calcium: 8.8 mg/dL — ABNORMAL LOW (ref 8.9–10.3)
Chloride: 84 mmol/L — ABNORMAL LOW (ref 98–111)
Creatinine, Ser: 2.61 mg/dL — ABNORMAL HIGH (ref 0.61–1.24)
GFR calc Af Amer: 26 mL/min — ABNORMAL LOW (ref >60)
GFR calc non Af Amer: 23 mL/min — ABNORMAL LOW (ref >60)
Glucose, Bld: 307 mg/dL — ABNORMAL HIGH (ref 70–99)
Potassium: 2 mmol/L — CL (ref 3.5–5.1)
Sodium: 130 mmol/L — ABNORMAL LOW (ref 135–145)

## 2019-10-09 LAB — CBC WITH DIFFERENTIAL/PLATELET
Abs Immature Granulocytes: 0.03 10*3/uL (ref 0.00–0.07)
Basophils Absolute: 0 10*3/uL (ref 0.0–0.1)
Basophils Relative: 0 %
Eosinophils Absolute: 0.1 10*3/uL (ref 0.0–0.5)
Eosinophils Relative: 0 %
HCT: 41.5 % (ref 39.0–52.0)
Hemoglobin: 13.4 g/dL (ref 13.0–17.0)
Immature Granulocytes: 0 %
Lymphocytes Relative: 17 %
Lymphs Abs: 2 10*3/uL (ref 0.7–4.0)
MCH: 30.9 pg (ref 26.0–34.0)
MCHC: 32.3 g/dL (ref 30.0–36.0)
MCV: 95.6 fL (ref 80.0–100.0)
Monocytes Absolute: 0.6 10*3/uL (ref 0.1–1.0)
Monocytes Relative: 5 %
Neutro Abs: 9.3 10*3/uL — ABNORMAL HIGH (ref 1.7–7.7)
Neutrophils Relative %: 78 %
Platelets: 202 10*3/uL (ref 150–400)
RBC: 4.34 MIL/uL (ref 4.22–5.81)
RDW: 15.1 % (ref 11.5–15.5)
WBC: 12 10*3/uL — ABNORMAL HIGH (ref 4.0–10.5)
nRBC: 0 % (ref 0.0–0.2)

## 2019-10-09 LAB — LACTIC ACID, PLASMA: Lactic Acid, Venous: 2.5 mmol/L (ref 0.5–1.9)

## 2019-10-09 MED ORDER — ORAL CARE MOUTH RINSE
15.0000 mL | Freq: Two times a day (BID) | OROMUCOSAL | Status: DC
  Administered 2019-10-09 – 2019-10-15 (×10): 15 mL via OROMUCOSAL

## 2019-10-09 MED ORDER — INSULIN ASPART 100 UNIT/ML ~~LOC~~ SOLN
20.0000 [IU] | Freq: Once | SUBCUTANEOUS | Status: AC
  Administered 2019-10-09: 20 [IU] via SUBCUTANEOUS

## 2019-10-09 MED ORDER — POTASSIUM CHLORIDE 2 MEQ/ML IV SOLN
INTRAVENOUS | Status: AC
  Administered 2019-10-09 – 2019-10-10 (×4): via INTRAVENOUS
  Filled 2019-10-09: qty 1000

## 2019-10-09 MED ORDER — LEVOTHYROXINE SODIUM 75 MCG PO TABS
75.0000 ug | ORAL_TABLET | Freq: Every day | ORAL | Status: DC
  Administered 2019-10-10 – 2019-10-15 (×6): 75 ug via ORAL
  Filled 2019-10-09 (×6): qty 1

## 2019-10-09 MED ORDER — ROSUVASTATIN CALCIUM 10 MG PO TABS
10.0000 mg | ORAL_TABLET | Freq: Every day | ORAL | Status: DC
  Administered 2019-10-10 – 2019-10-13 (×4): 10 mg via ORAL
  Filled 2019-10-09 (×4): qty 1

## 2019-10-09 MED ORDER — IPRATROPIUM-ALBUTEROL 0.5-2.5 (3) MG/3ML IN SOLN: 3.0000 mL | Freq: Three times a day (TID) | RESPIRATORY_TRACT | Status: DC

## 2019-10-09 MED ORDER — ACETAMINOPHEN 325 MG PO TABS
650.0000 mg | ORAL_TABLET | Freq: Four times a day (QID) | ORAL | Status: DC | PRN
  Filled 2019-10-09: qty 2

## 2019-10-09 MED ORDER — POTASSIUM CHLORIDE 10 MEQ/100ML IV SOLN
10.0000 meq | INTRAVENOUS | Status: AC
  Administered 2019-10-09 (×2): 10 meq via INTRAVENOUS
  Filled 2019-10-09 (×2): qty 100

## 2019-10-09 MED ORDER — BRIMONIDINE TARTRATE 0.15 % OP SOLN
1.0000 [drp] | Freq: Three times a day (TID) | OPHTHALMIC | Status: DC
  Administered 2019-10-09 – 2019-10-15 (×18): 1 [drp] via OPHTHALMIC
  Filled 2019-10-09: qty 5

## 2019-10-09 MED ORDER — POTASSIUM CHLORIDE CRYS ER 20 MEQ PO TBCR
40.0000 meq | EXTENDED_RELEASE_TABLET | Freq: Once | ORAL | Status: AC
  Administered 2019-10-09: 40 meq via ORAL
  Filled 2019-10-09: qty 2

## 2019-10-09 MED ORDER — DORZOLAMIDE HCL 2 % OP SOLN
1.0000 [drp] | Freq: Two times a day (BID) | OPHTHALMIC | Status: DC
  Administered 2019-10-09 – 2019-10-15 (×12): 1 [drp] via OPHTHALMIC
  Filled 2019-10-09: qty 10

## 2019-10-09 MED ORDER — LATANOPROST 0.005 % OP SOLN
1.0000 [drp] | Freq: Every day | OPHTHALMIC | Status: DC
  Administered 2019-10-09 – 2019-10-15 (×6): 1 [drp] via OPHTHALMIC
  Filled 2019-10-09: qty 2.5

## 2019-10-09 MED ORDER — INSULIN ASPART 100 UNIT/ML ~~LOC~~ SOLN
0.0000 [IU] | SUBCUTANEOUS | Status: DC
  Administered 2019-10-09: 7 [IU] via SUBCUTANEOUS
  Administered 2019-10-10: 4 [IU] via SUBCUTANEOUS

## 2019-10-09 MED ORDER — ACETAMINOPHEN 650 MG RE SUPP: 650.0000 mg | Freq: Four times a day (QID) | RECTAL | Status: DC | PRN

## 2019-10-09 MED ORDER — SODIUM CHLORIDE 0.9% FLUSH
3.0000 mL | Freq: Two times a day (BID) | INTRAVENOUS | Status: DC
  Administered 2019-10-09 – 2019-10-15 (×9): 3 mL via INTRAVENOUS

## 2019-10-09 MED ORDER — SODIUM CHLORIDE 0.9 % IV SOLN
INTRAVENOUS | Status: DC
  Administered 2019-10-09: 18:00:00 via INTRAVENOUS

## 2019-10-09 MED ORDER — CHLORHEXIDINE GLUCONATE CLOTH 2 % EX PADS
6.0000 | MEDICATED_PAD | Freq: Every day | CUTANEOUS | Status: DC
  Administered 2019-10-09 – 2019-10-14 (×6): 6 via TOPICAL

## 2019-10-09 MED ORDER — ONDANSETRON HCL 4 MG/2ML IJ SOLN
4.0000 mg | Freq: Four times a day (QID) | INTRAMUSCULAR | Status: DC | PRN
  Filled 2019-10-09: qty 2

## 2019-10-09 MED ORDER — LACTATED RINGERS IV BOLUS
1000.0000 mL | Freq: Once | INTRAVENOUS | Status: AC
  Administered 2019-10-09: 1000 mL via INTRAVENOUS

## 2019-10-09 MED ORDER — TIMOLOL MALEATE 0.5 % OP SOLN
1.0000 [drp] | Freq: Two times a day (BID) | OPHTHALMIC | Status: DC
  Administered 2019-10-09 – 2019-10-12 (×6): 1 [drp] via OPHTHALMIC
  Filled 2019-10-09: qty 5

## 2019-10-09 MED ORDER — PIPERACILLIN-TAZOBACTAM 3.375 G IVPB
3.3750 g | Freq: Three times a day (TID) | INTRAVENOUS | Status: DC
  Administered 2019-10-09 – 2019-10-14 (×14): 3.375 g via INTRAVENOUS
  Filled 2019-10-09 (×13): qty 50

## 2019-10-09 MED ORDER — MORPHINE SULFATE 10 MG/5ML PO SOLN
10.0000 mg | ORAL | Status: DC | PRN
  Filled 2019-10-09: qty 5

## 2019-10-09 MED ORDER — AMIODARONE HCL 200 MG PO TABS
200.0000 mg | ORAL_TABLET | Freq: Every day | ORAL | Status: DC
  Administered 2019-10-10 – 2019-10-15 (×5): 200 mg via ORAL
  Filled 2019-10-09 (×6): qty 1

## 2019-10-09 MED ORDER — ONDANSETRON HCL 4 MG PO TABS: 4.0000 mg | ORAL_TABLET | Freq: Four times a day (QID) | ORAL | Status: DC | PRN

## 2019-10-09 MED ORDER — SODIUM CHLORIDE 0.9 % IV BOLUS: 1000.0000 mL | Freq: Once | INTRAVENOUS | Status: DC

## 2019-10-09 NOTE — ED Notes (Signed)
Wife, [name] can be reached at [phone]

## 2019-10-09 NOTE — ED Notes (Signed)
ED TO INPATIENT HANDOFF REPORT  Name/Age/Gender [name] 78 y.o. male  Code Status Code Status History    Date Active Date Inactive Code Status Order ID Comments User Context   05/30/2018 0005 06/08/2018 1903 DNR [id number]  Colbert Ewing, MD ED   12/28/2017 1946 01/02/2018 2033 DNR [id number]  Etta Quill, DO ED   07/29/2017 0017 07/31/2017 2152 DNR [id number]  Schorr, Rhetta Mura, NP Inpatient   07/28/2017 2021 07/29/2017 0016 DNR [id number]  Karmen Bongo, MD Inpatient   05/13/2014 1712 05/17/2014 1644 Full Code [id number]  Geradine Girt, DO Inpatient   11/02/2013 1821 11/04/2013 1820 Full Code [id number]  Mal Misty, MD Inpatient   11/01/2013 2045 11/02/2013 1313 Full Code [id number]  Serafina Mitchell, MD Inpatient   10/26/2013 1850 10/30/2013 1616 Full Code [id number]  Phebe Colla, MD Inpatient   Advance Care Planning Activity    Questions for Most Recent Historical Code Status (Order [id number])    Question Answer Comment   In the event of cardiac or respiratory ARREST Do not call a "code blue"    In the event of cardiac or respiratory ARREST Do not perform Intubation, CPR, defibrillation or ACLS    In the event of cardiac or respiratory ARREST Use medication by any route, position, wound care, and other measures to relive pain and suffering. May use oxygen, suction and manual treatment of airway obstruction as needed for comfort.    Comments Per patient       Home/SNF/Other Home  Chief Complaint AMS and Unresponsive   Level of Care/Admitting Diagnosis ED Disposition    ED Disposition Condition [facility] Area: Learned [100102]  Level of Care: Stepdown [14]  Admit to SDU based on following criteria: Hemodynamic compromise or significant risk of instability:  Patient requiring short term acute titration and management of vasoactive drips, and invasive monitoring (i.e., CVP and Arterial line).  Covid Evaluation: Person Under Investigation  (PUI)  Diagnosis: Hyperosmolar hyperglycemic state (HHS) [id number]  Admitting Physician: Bennie Pierini [id number]  Attending Physician: Jonnie Finner, [place] [[id number]]  Estimated length of stay: past midnight tomorrow  Certification:: I certify this patient will need inpatient services for at least 2 midnights  PT Class (Do Not Modify): Inpatient [101]  PT Acc Code (Do Not Modify): Private [1]       Medical History Past Medical History:  Diagnosis Date  . Anticoagulated on Coumadin 05/05/2018  . Bladder cancer ([place]) 2014  . COPD (chronic obstructive pulmonary disease) (HCC)    CPAP  . Depression   . Diabetes mellitus    TypeII  . 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 GERD (gastroesophageal reflux disease)   . H/O Legionnaire's disease 2003  . Hyperlipemia   . Hypothyroidism   . Morbid obesity ([place])   . OSA on CPAP   . Osteoarthritis    fingers  . Pancreatitis   . Peripheral neuropathy   . Persistent atrial fibrillation ([place])   . Renal insufficiency   . Sick sinus syndrome ([place]) 06/2010   s/p PPM by [employer]  . Venous insufficiency     Allergies Allergies  Allergen Reactions  . Actos [Pioglitazone] Swelling  . Timolol Other (See Comments)    Slow heart rate but tolerates Cosopt (dorzolamide-timolol) Pt has a rx for Timolol and uses it for glaucoma   . Ceftriaxone Rash    IV Location/Drains/Wounds Patient Lines/Drains/Airways Status   Active  Line/Drains/Airways    Name:   Placement date:   Placement time:   Site:   Days:   Peripheral IV 10/09/19 Left Antecubital   10/09/19    1712    Antecubital   less than 1          Labs/Imaging Results for orders placed or performed during the hospital encounter of 10/09/19 (from the past 48 hour(s))  CBC with Differential     Status: Abnormal   Collection Time: 10/09/19  4:54 PM  Result Value Ref Range   WBC 12.0 (H) 4.0 - 10.5 K/uL   RBC 4.34 4.22 - 5.81 MIL/uL   Hemoglobin 13.4 13.0 -  17.0 g/dL   HCT 41.5 39.0 - 52.0 %   MCV 95.6 80.0 - 100.0 fL   MCH 30.9 26.0 - 34.0 pg   MCHC 32.3 30.0 - 36.0 g/dL   RDW 15.1 11.5 - 15.5 %   Platelets 202 150 - 400 K/uL   nRBC 0.0 0.0 - 0.2 %   Neutrophils Relative % 78 %   Neutro Abs 9.3 (H) 1.7 - 7.7 K/uL   Lymphocytes Relative 17 %   Lymphs Abs 2.0 0.7 - 4.0 K/uL   Monocytes Relative 5 %   Monocytes Absolute 0.6 0.1 - 1.0 K/uL   Eosinophils Relative 0 %   Eosinophils Absolute 0.1 0.0 - 0.5 K/uL   Basophils Relative 0 %   Basophils Absolute 0.0 0.0 - 0.1 K/uL   Immature Granulocytes 0 %   Abs Immature Granulocytes 0.03 0.00 - 0.07 K/uL    Comment: Performed at [facility], [place] [address], [place], [place] [id number]  Basic metabolic panel     Status: Abnormal   Collection Time: 10/09/19  4:54 PM  Result Value Ref Range   Sodium 130 (L) 135 - 145 mmol/L   Potassium 2.0 (LL) 3.5 - 5.1 mmol/L    Comment: CRITICAL RESULT CALLED TO, READ BACK BY AND VERIFIED WITH: T.WISE,RN [id number] @1828  BY V.WILKINS    Chloride 84 (L) 98 - 111 mmol/L   CO2 32 22 - 32 mmol/L   Glucose, Bld 307 (H) 70 - 99 mg/dL   BUN 47 (H) 8 - 23 mg/dL   Creatinine, Ser 2.61 (H) 0.61 - 1.24 mg/dL   Calcium 8.8 (L) 8.9 - 10.3 mg/dL   GFR calc non Af Amer 23 (L) >60 mL/min   GFR calc Af Amer 26 (L) >60 mL/min   Anion gap 14 5 - 15    Comment: Performed at [facility], [place] [address], [place], [place] [address]  Lactic acid, plasma     Status: Abnormal   Collection Time: 10/09/19  4:55 PM  Result Value Ref Range   Lactic Acid, Venous 2.5 (HH) 0.5 - 1.9 mmol/L    Comment: CRITICAL RESULT CALLED TO, READ BACK BY AND VERIFIED WITH: I.HIBIB,RN [id number] @1829  BY V.WILKINS Performed at [facility], [place] [address], [place], [place] [address]   Urinalysis, Routine w reflex microscopic     Status: Abnormal   Collection Time: 10/09/19  6:35 PM  Result Value Ref Range   Color, Urine YELLOW YELLOW    APPearance HAZY (A) CLEAR   Specific Gravity, Urine 1.008 1.005 - 1.030   pH 6.0 5.0 - 8.0   Glucose, UA 50 (A) NEGATIVE mg/dL   Hgb urine dipstick LARGE (A) NEGATIVE   Bilirubin Urine NEGATIVE NEGATIVE   Ketones, ur NEGATIVE NEGATIVE mg/dL   Protein, ur  30 (A) NEGATIVE mg/dL   Nitrite NEGATIVE NEGATIVE   Leukocytes,Ua LARGE (A) NEGATIVE   RBC / HPF >50 (H) 0 - 5 RBC/hpf   WBC, UA >50 (H) 0 - 5 WBC/hpf   Bacteria, UA RARE (A) NONE SEEN   Squamous Epithelial / LPF 0-5 0 - 5   Mucus PRESENT     Comment: Performed at [facility], [place] [address], [place], [place] [address]   Dg Chest Port 1 View  Result Date: 10/09/2019 CLINICAL DATA:  Altered mental status. EXAM: PORTABLE CHEST 1 VIEW COMPARISON:  06/02/2018 FINDINGS: Stable borderline cardiomegaly. Dual lead transvenous pacemaker remains in appropriate position. Aortic atherosclerosis. Both lungs are clear. IMPRESSION: Stable borderline cardiomegaly. No active lung disease. Electronically Signed   By: Marlaine Hind M.D.   On: 10/09/2019 17:21    Pending Labs Unresulted Labs (From admission, onward)    Start     Ordered   10/09/19 1835  Urine culture  ONCE - STAT,   STAT     10/09/19 1834   10/09/19 1734  SARS CORONAVIRUS 2 (TAT 6-24 HRS) Nasopharyngeal Nasopharyngeal Swab  (Asymptomatic/Tier 2 Patients Labs)  Once,   STAT    Question Answer Comment  Is this test for diagnosis or screening Screening   Symptomatic for COVID-19 as defined by CDC No   Hospitalized for COVID-19 No   Admitted to ICU for COVID-19 No   Previously tested for COVID-19 No   Resident in a congregate (group) care setting No   Employed in healthcare setting No      10/09/19 1733   10/09/19 1655  Lactic acid, plasma  Now then every 2 hours,   STAT     10/09/19 1655   10/09/19 1655  Culture, blood (routine x 2)  BLOOD CULTURE X 2,   STAT     10/09/19 1655   Signed and Held  Hemoglobin A1c  Add-on,   R    Comments: To assess prior glycemic  control    Signed and Held   Signed and Held  Basic metabolic panel  Now then every 4 hours,   R     Signed and Held   Signed and Held  Magnesium  Add-on,   R     Signed and Held   Signed and Held  Phosphorus  Add-on,   R     Signed and Held   Signed and Held  Brain natriuretic peptide  Once,   R     Signed and Held          Vitals/Pain Today's Vitals   10/09/19 1815 10/09/19 1830 10/09/19 1845 10/09/19 1900  BP: (!) 82/48  112/60 (!) 73/45  Pulse:  98 63 63  Resp: 12 18 12  (!) 23  SpO2:  100% 100% 100%  Weight:      Height:      PainSc:        Isolation Precautions No active isolations  Medications Medications  0.9 %  sodium chloride infusion ( Intravenous Rate/Dose Change 10/09/19 1755)  potassium chloride 10 mEq in 100 mL IVPB (10 mEq Intravenous New Bag/Given 10/09/19 1920)  lactated ringers bolus 1,000 mL (0 mLs Intravenous Stopped 10/09/19 1747)  potassium chloride SA (KLOR-CON) CR tablet 40 mEq (40 mEq Oral Given 10/09/19 1920)    Mobility non-ambulatory

## 2019-10-09 NOTE — H&P (Signed)
History and Physical    [name] WSF:[id number] DOB: 31-Mar-1941 DOA: 10/09/2019  PCP: Pleas Koch, NP Patient coming from: Home  I have personally briefly reviewed patient's old medical records in [place]  Chief Complaint: Altered mental status  HPI: [name] is a 78 y.o. 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  He presents today from home with altered mental status.  He was unable to provide any information on the events leading up to his hospitalization today.  His wife reports that he was in his usual state of health until this morning, at which time he was noted to be completely altered and largely unresponsive.  His wife notes that he has had poor oral intake over the past few weeks and that his blood sugars have been poorly controlled over that time.  He has been on levofloxacin for unclear reasons, with varying counts stating that it is for urinary tract infection, while other count stating that it is for skin and soft tissue infection.  He is receiving wound care via hospice nursing and reports that his lower extremity wounds appear to be healing.  Denies fever, chills, night sweats, nausea, vomiting, chest pain, dysuria, hematuria, urinary color change.  He does endorse chronic shortness of breath for which he takes liquid morphine.  Both he and his wife confirm that he is DNR.  His wife would like him to be managed with antibiotics and IV fluids as indicated by the medical team.  ED Course: In the ED, patient initially bradycardic with subsequent improvement and persistently hypotensive with MAPs in the upper 50s low 60s.  He is saturating well on his home 5 L oxygen requirement.  Initial labs notable for WBC  12.0, sodium 132 (corrected sodium 140), potassium 2.4, CO2 26, BUN 38, creatinine 2.36, glucose 519, AST 61, ALT 63, alkaline phosphatase 266, lactic acid 2.5, TSH 6.12. While in the ED, he received IV fluids and oral and IV potassium chloride.  Review of Systems: As per HPI otherwise 10 point review of systems negative.   Past Medical History:  Diagnosis Date   Anticoagulated on Coumadin 05/05/2018   Bladder cancer ([place]) 2014   COPD (chronic obstructive pulmonary disease) (HCC)    CPAP   Depression    Diabetes mellitus    TypeII   Diastolic dysfunction with chronic heart failure (HCC)    GERD (gastroesophageal reflux disease)    H/O Legionnaire's disease 2003   Hyperlipemia    Hypothyroidism    Morbid obesity ([place])    OSA on CPAP    Osteoarthritis    fingers   Pancreatitis    Peripheral neuropathy    Persistent atrial fibrillation (HCC)    Renal insufficiency    Sick sinus syndrome ([place]) 06/2010   s/p PPM by [employer]   Venous insufficiency     Past Surgical History:  Procedure Laterality Date   BELPHAROPTOSIS REPAIR Right    Glaucoma   CARDIAC CATHETERIZATION     11-02-13   CARDIOVERSION N/A 05/16/2014   Procedure: CARDIOVERSION;  Surgeon: Josue Hector, MD;  Location: [facility];  Service: Cardiovascular;  Laterality: N/A;   CARDIOVERSION N/A 11/08/2014   Procedure: CARDIOVERSION;  Surgeon: Fay Records, MD;  Location: [facility]  ENDOSCOPY;  Service: Cardiovascular;  Laterality: N/A;   CARDIOVERSION N/A 06/22/2015   Procedure: CARDIOVERSION;  Surgeon: Jerline Pain, MD;  Location: [facility] ENDOSCOPY;  Service: Cardiovascular;  Laterality: N/A;   CHOLECYSTECTOMY  2012   CYSTOSCOPY W/ URETERAL STENT PLACEMENT Right 10/26/2013   Procedure: CYSTOSCOPY WITH RETROGRADE PYELOGRAM/URETERAL STENT PLACEMENT;  Surgeon: Alexis Frock, MD;  Location: [facility];  Service: Urology;  Laterality: Right;   CYSTOSCOPY WITH URETEROSCOPY AND STENT PLACEMENT Right 01/11/2014    Procedure: CYSTOSCOPY WITH URETEROSCOPY ,RIGHT RETROGRADE AND STENT CHANGE AND LASER OF URETERAL TUMOR;  Surgeon: Alexis Frock, MD;  Location: [facility];  Service: Urology;  Laterality: Right;   CYSTOSCOPY/RETROGRADE/URETEROSCOPY Right 08/23/2014   Procedure: CYSTOSCOPY/RETROGRADE/ DIAGNOSTIC URETEROSCOPY/RIGHT RENAL STONE EXTRACTION;  Surgeon: Alexis Frock, MD;  Location: [facility];  Service: Urology;  Laterality: Right;   EMBOLECTOMY Left 11/02/2013   Procedure: LEFT FEMORAL EMBOLECTOMY, LEFT FEMORAL ARTERY ENDARTERECTOMY WITH DACRON PATCH ANGIOPLASTY.;  Surgeon: Mal Misty, MD;  Location: [place];  Service: Vascular;  Laterality: Left;   EYE SURGERY Left    cataract   GROIN DEBRIDEMENT Right 05/08/2015   Procedure: REMOVAL OF RIGHT GROIN MASS;  Surgeon: Angelia Mould, MD;  Location: [facility];  Service: Vascular;  Laterality: Right;   HOLMIUM LASER APPLICATION Right 12/13/6058   Procedure: HOLMIUM LASER APPLICATION;  Surgeon: Alexis Frock, MD;  [facility]: [facility];  Service: Urology;  Laterality: Right;   INSERT / REPLACE / [place]   NASAL SEPTUM SURGERY  1967   PACEMAKER PLACEMENT  06/2010   [facility] Jude Medical Accent RF DR, Model [id number] ( Serial number [id number])   TEE WITHOUT CARDIOVERSION N/A 05/16/2014   Procedure: TRANSESOPHAGEAL ECHOCARDIOGRAM (TEE);  Surgeon: Josue Hector, MD;  Location: [facility] ENDOSCOPY;  Service: Cardiovascular;  Laterality: N/A;   thromboembolectomy and four compartment fasciotomy Right 2009   leg   TRANSURETHRAL RESECTION OF BLADDER TUMOR WITH GYRUS (TURBT-GYRUS) N/A 10/26/2013   Procedure: TRANSURETHRAL RESECTION OF BLADDER TUMOR WITH GYRUS (TURBT-GYRUS);  Surgeon: Alexis Frock, MD;  [facility];  Service: Urology;  Laterality: N/A;   TRANSURETHRAL RESECTION OF BLADDER TUMOR WITH GYRUS (TURBT-GYRUS) N/A 01/11/2014   Procedure: TRANSURETHRAL RESECTION OF BLADDER TUMOR WITH GYRUS (TURBT-GYRUS);  Surgeon: Alexis Frock, MD;  Location: [facility];   Service: Urology;  Laterality: N/A;   WISDOM TOOTH EXTRACTION       reports that he quit smoking about 12 years ago. His smoking use included cigarettes. He has a 100.00 pack-year smoking history. He has never used smokeless tobacco. He reports that he does not drink alcohol or use drugs.  Allergies  Allergen Reactions   Actos [Pioglitazone] Swelling   Timolol Other (See Comments)    Slow heart rate but tolerates Cosopt (dorzolamide-timolol) Pt has a rx for Timolol and uses it for glaucoma    Ceftriaxone Rash    Family History  Problem Relation Age of Onset   Liver cancer Mother        deceased age 36   Cancer Mother        liver cancer   Heart attack Father    Colon cancer Neg Hx     Prior to Admission medications   Medication Sig Start Date End Date Taking? Authorizing Provider  acetaminophen (TYLENOL) 500 MG tablet Take 1,000 mg by mouth 2 (two) times daily as needed (pain/headache).     [provider]  albuterol (VENTOLIN HFA) 108 (90 Base) MCG/ACT inhaler Inhale 2 puffs into the lungs every  6 (six) hours as needed for wheezing or shortness of breath. 11/23/17   Parrett, Fonnie Mu, NP  amiodarone (PACERONE) 200 MG tablet TAKE 1 TABLET EVERY DAY 01/05/19   Burtis Junes, NP  Ascorbic Acid (VITAMIN C) 500 MG tablet Take 500 mg by mouth daily.     [provider]  Blood Glucose Monitoring Suppl (ACCU-CHEK AVIVA CONNECT) w/Device KIT 1 Package by Does not apply route 2 (two) times daily. 09/07/18   Renato Shin, MD  brimonidine (ALPHAGAN P) 0.1 % SOLN Place 1 drop into the left eye 3 (three) times daily.     [provider]  cholecalciferol (VITAMIN D) 1000 UNITS tablet Take 1,000 Units by mouth daily.     [provider]  CINNAMON PO Take 2,000 mg by mouth daily.    [provider]  dextromethorphan (DELSYM) 30 MG/5ML liquid Take 90 mg by mouth 2 (two) times daily as needed for cough.    [provider]  Digestive  Enzymes (MULTI-ENZYME) TABS Take 1 tablet by mouth daily.    [provider]  docusate sodium (COLACE) 100 MG capsule Take 100-200 mg by mouth daily as needed for mild constipation.     [provider]  dorzolamide (TRUSOPT) 2 % ophthalmic solution Place 1 drop into both eyes 2 (two) times daily.    [provider]  ELIQUIS 5 MG TABS tablet TAKE 1 TABLET TWICE DAILY 04/19/19   Burtis Junes, NP  fluticasone (FLONASE) 50 MCG/ACT nasal spray Place 2 sprays into both nostrils as needed (congestion).     [provider]  furosemide (LASIX) 40 MG tablet Take 1 tablet (40 mg total) by mouth 2 (two) times daily. 04/19/19   Burtis Junes, NP  glucose blood test strip Use as instructed to check blood sugar twice daily E11.69 09/07/18   Renato Shin, MD  guaiFENesin 200 MG tablet Take 400 mg by mouth 2 (two) times daily. '400mg'$  in the morning and '600mg'$  in the evening     [provider]  insulin regular (NOVOLIN R RELION) 100 units/mL injection 4 times daily before meals 300-210-210-190 units 09/24/18   Renato Shin, MD  Lactobacillus (ACIDOPHILUS) CAPS capsule Take 1 capsule by mouth every evening.     [provider]  Lancets (ACCU-CHEK SOFT TOUCH) lancets Use as instructed to check blood sugar twice daily E11.69 09/07/18   Renato Shin, MD  levothyroxine (SYNTHROID) 75 MCG tablet TAKE 1 TABLET EVERY MORNING ON AN EMPTY STOMACH WITH WATER ONLY. NO FOOD OR OTHER MEDICATIONS FOR 30 MINUTES. 09/16/19   Pleas Koch, NP  metoprolol tartrate (LOPRESSOR) 25 MG tablet Take 1 tablet (25 mg total) by mouth 2 (two) times daily. 04/19/19   Burtis Junes, NP  Multiple Vitamin (MULTIVITAMIN WITH MINERALS) TABS tablet Take 1 tablet by mouth every evening.     [provider]  nitroGLYCERIN (NITROSTAT) 0.4 MG SL tablet Place 1 tablet (0.4 mg total) under the tongue every 5 (five) minutes as needed for chest pain. Please make yearly appt with Dr. Rayann Heman  for July before anymore refills. 1st attempt 05/20/19 08/18/19  Burtis Junes, NP  Omega-3 Fatty Acids (FISH OIL) 1000 MG CAPS Take 1,000 mg by mouth daily.     [provider]  omeprazole (PRILOSEC) 40 MG capsule Take 1 capsule (40 mg total) by mouth daily. For heartburn. 04/05/19   Pleas Koch, NP  rosuvastatin (CRESTOR) 40 MG tablet TAKE 1/2 TABLET (20 MG  TOTAL) BY MOUTH EVERY EVENING. FOR CHOLESTEROL. 09/16/19   Pleas Koch, NP  Timolol Maleate 0.5 % (DAILY) SOLN Place 1 drop into both eyes 2 (two) times daily.    [provider]  traMADol (ULTRAM) 50 MG tablet Take 2 tablets (100 mg total) by mouth 3 (three) times daily as needed. 04/13/19   Burtis Junes, NP  travoprost, benzalkonium, (TRAVATAN) 0.004 % ophthalmic solution Place 1 drop into the left eye at bedtime.    [provider]    Physical Exam: Vitals:   10/09/19 1845 10/09/19 1900 10/09/19 1942 10/09/19 2030  BP: 112/60 (!) 73/45 (!) 78/39 (!) 73/41  Pulse: 63 63 63 64  Resp: 12 (!) '23 13 13  '$ SpO2: 100% 100% 100% 96%  Weight:      Height:        Constitutional: NAD, calm, comfortable Eyes: PERRL, lids and conjunctivae normal  ENMT: Mucous membranes are dry. Posterior pharynx clear of any exudate or lesions.  Neck: normal, supple, no masses Respiratory: bibasilar crackles, decreased BS at bases bilaterally, L>R Cardiovascular: Regular rate and rhythm, no murmurs / rubs / gallops. 1+ lower extremity edema. 1+ pedal pulses. Abdomen: Obese, no tenderness, no masses palpated. Bowel sounds positive.  Musculoskeletal: no clubbing / cyanosis. No joint deformity upper and lower extremities. Skin: Changes of chronic venous stasis in the bilateral lower extremities with wounds in various stages of healing, without obvious purulence or erythema noted Neurologic: CN 2-12 grossly intact.  Decreased sensation of bilateral lower extremities. Psychiatric: Alert, oriented to self, person and  situation, not time or date.   Labs on Admission: I have personally reviewed following labs and imaging studies  CBC: Recent Labs  Lab 10/09/19 1654  WBC 12.0*  NEUTROABS 9.3*  HGB 13.4  HCT 41.5  MCV 95.6  PLT 338   Basic Metabolic Panel: Recent Labs  Lab 10/07/19 0800 10/09/19 1654  NA 132* 130*  K 2.4* 2.0*  CL 91* 84*  CO2 26 32  GLUCOSE 519* 307*  BUN 38* 47*  CREATININE 2.36* 2.61*  CALCIUM 8.2* 8.8*   GFR: Estimated Creatinine Clearance: 32.6 mL/min (A) (by C-G formula based on SCr of 2.61 mg/dL (H)). Liver Function Tests: Recent Labs  Lab 10/07/19 0800  AST 61*  ALT 63*  ALKPHOS 266*  BILITOT 0.5  PROT 6.2  ALBUMIN 3.1*   No results for input(s): LIPASE, AMYLASE in the last 168 hours. No results for input(s): AMMONIA in the last 168 hours. Coagulation Profile: No results for input(s): INR, PROTIME in the last 168 hours. Cardiac Enzymes: No results for input(s): CKTOTAL, CKMB, CKMBINDEX, TROPONINI in the last 168 hours. BNP (last 3 results) No results for input(s): PROBNP in the last 8760 hours. HbA1C: No results for input(s): HGBA1C in the last 72 hours. CBG: No results for input(s): GLUCAP in the last 168 hours. Lipid Profile: Recent Labs    10/07/19 0800  CHOL 81*  HDL 31*  LDLCALC 24  TRIG 150*  CHOLHDL 2.6   Thyroid Function Tests: Recent Labs    10/07/19 0800  TSH 6.120*   Anemia Panel: No results for input(s): VITAMINB12, FOLATE, FERRITIN, TIBC, IRON, RETICCTPCT in the last 72 hours. Urine analysis:    Component Value Date/Time   COLORURINE YELLOW 10/09/2019 1835   APPEARANCEUR HAZY (A) 10/09/2019 1835   LABSPEC 1.008 10/09/2019 1835   PHURINE 6.0 10/09/2019 1835   GLUCOSEU 50 (A) 10/09/2019 1835   GLUCOSEU 100 09/30/2013 1535   HGBUR  LARGE (A) 10/09/2019 [place] 10/09/2019 [place] 10/09/2019 1835   PROTEINUR 30 (A) 10/09/2019 1835   UROBILINOGEN 1.0 10/01/2013 1003   NITRITE  NEGATIVE 10/09/2019 1835   LEUKOCYTESUR LARGE (A) 10/09/2019 1835    Radiological Exams on Admission: Dg Chest Port 1 View  Result Date: 10/09/2019 CLINICAL DATA:  Altered mental status. EXAM: PORTABLE CHEST 1 VIEW COMPARISON:  06/02/2018 FINDINGS: Stable borderline cardiomegaly. Dual lead transvenous pacemaker remains in appropriate position. Aortic atherosclerosis. Both lungs are clear. IMPRESSION: Stable borderline cardiomegaly. No active lung disease. Electronically Signed   By: Marlaine Hind M.D.   On: 10/09/2019 17:21    EKG: Independently reviewed.  V-paced rhythm.  Assessment/Plan Active Problems:   Hyperosmolar hyperglycemic state (HHS) [facility])  Mr. Hidrogo presents with hypoactive delirium and hypotension of uncertain etiology.  Although blood glucose quite elevated on presentation, there is no evidence for diabetic ketoacidosis, however, mental status improved with blood sugar control, which suggests likely contribution of HHS.  Certainly, dehydration is playing a role as well.  I am concerned for sepsis, however, given his persistent hypotension that has not resolved with IV fluid administration to this point.  Source of infection is likely urinary given dirty UA and as chest x-ray shows no evidence for pneumonia, there are no gastrointestinal symptoms of significance and his lower extremity wounds do not look acutely infected.  Sepsis secondary to UTI Hypotension Lactatemia -Admit to stepdown -Given allergy to ceftriaxone, will start Zosyn -Hold Levaquin -Follow-up blood and urine cultures -Trend lactate -Reengage Hospice services in AM  HHS Hyperkalemia -Improving with volume resuscitation -Given profound hypokalemia, will avoid insulin infusion for now and proceed with frequent blood sugar checks and subcutaneous insulin -Continue aggressive volume resuscitation with potassium containing fluids -Can transition to IV insulin infusion when serum potassium greater than  3.3 -FSBS every 2 hours -N.p.o. 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

## 2019-10-09 NOTE — ED Triage Notes (Signed)
Patient arrived via [facility]. Patient is [facility]. Patient was AOx4 this morning, however patient is now AOx0 and hypotensive. Patient has IV in place. Patient is currently being treated for UTI.

## 2019-10-09 NOTE — Progress Notes (Signed)
ANTICOAGULATION CONSULT NOTE - Initial Consult  Pharmacy Consult for IV heparin Indication: atrial fibrillation  Allergies  Allergen Reactions  . Actos [Pioglitazone] Swelling  . Timolol Other (See Comments)    Slow heart rate but tolerates Cosopt (dorzolamide-timolol) Pt has a rx for Timolol and uses it for glaucoma   . Ceftriaxone Rash    Patient Measurements: Height: 5\' 10"  (177.8 cm) Weight: 286 lb 9.6 oz (130 kg) IBW/kg (Calculated) : 73 Heparin Dosing Weight: 83 kg  Vital Signs: BP: 84/41 (11/01 2115) Pulse Rate: 63 (11/01 2115)  Labs: Recent Labs    10/07/19 0800 10/09/19 1654  HGB  --  13.4  HCT  --  41.5  PLT  --  202  CREATININE 2.36* 2.61*    Estimated Creatinine Clearance: 32.6 mL/min (A) (by C-G formula based on SCr of 2.61 mg/dL (H)).   Medical History: Past Medical History:  Diagnosis Date  . Anticoagulated on Coumadin 05/05/2018  . Bladder cancer ([place]) 2014  . COPD (chronic obstructive pulmonary disease) (HCC)    CPAP  . Depression   . Diabetes mellitus    TypeII  . Diastolic dysfunction with chronic heart failure ([place])   . GERD (gastroesophageal reflux disease)   . H/O Legionnaire's disease 2003  . Hyperlipemia   . Hypothyroidism   . Morbid obesity ([place])   . OSA on CPAP   . Osteoarthritis    fingers  . Pancreatitis   . Peripheral neuropathy   . Persistent atrial fibrillation ([place])   . Renal insufficiency   . Sick sinus syndrome ([place]) 06/2010   s/p PPM by [employer] Jude  . Venous insufficiency     Medications:  Scheduled:   Infusions:  . sodium chloride 150 mL/hr at 10/09/19 1755  . piperacillin-tazobactam (ZOSYN)  IV    . potassium chloride 10 mEq (10/09/19 1920)    Assessment: 11 yoM admitted with hypotension. Patient currently being treated for UTI.  On chronic eliquis for a-fib. LD 11/1 1100. Will use aptt to follow d/t recent apixaban use.  Baseline labs: H/H=13.4/41.5, plts = 202, aptt =38 , HL = >2.20  Goal of Therapy:   aptt = 66-102 Heparin level 0.3-0.7 units/ml Monitor platelets by anticoagulation protocol: Yes   Plan:  Baseline aptt/HL/INR STAT Ht and wt STAT Heparin drip at 1200 units/hr Daily CBC/HLCheck 1st aptt in 8 hours   Lawana Pai R 10/09/2019,9:35 PM

## 2019-10-09 NOTE — ED Notes (Signed)
Pt able to void in urinal with assistance.

## 2019-10-09 NOTE — ED Provider Notes (Signed)
[facility] DEPT Provider Note   CSN: [id number] Arrival date & time: 10/09/19  1631     History   Chief Complaint Chief Complaint  Patient presents with  . Hypotension  . Altered Mental Status    HPI [name] is a 78 y.o. male.  Level 5 caveat secondary to altered mental status.  78 year old male on hospice confirmed DNR/DNI by EMS transported here for being unresponsive today.  Apparently earlier today he was awake and verbal.  No other history available at this time.  Family reportedly are on their way in[place] on baseline 5 L of oxygen nasal cannula.     The history is provided by the EMS personnel.  Altered Mental Status Presenting symptoms: unresponsiveness   Severity:  Severe Most recent episode:  Today Episode history:  Continuous Timing:  Constant Progression:  Unchanged Chronicity:  New   Past Medical History:  Diagnosis Date  . Anticoagulated on Coumadin 05/05/2018  . Bladder cancer ([place]) 2014  . COPD (chronic obstructive pulmonary disease) (HCC)    CPAP  . Depression   . Diabetes mellitus    TypeII  . Diastolic dysfunction with chronic heart failure ([place])   . GERD (gastroesophageal reflux disease)   . H/O Legionnaire's disease 2003  . Hyperlipemia   . Hypothyroidism   . Morbid obesity ([place])   . OSA on CPAP   . Osteoarthritis    fingers  . Pancreatitis   . Peripheral neuropathy   . Persistent atrial fibrillation ([place])   . Renal insufficiency   . Sick sinus syndrome ([place]) 06/2010   s/p PPM by [employer]  . Venous insufficiency     Patient Active Problem List   Diagnosis Date Noted  . Acute on chronic systolic heart failure ([place]) 06/01/2018  . Severe aortic stenosis 06/01/2018  . Hypoxia   . Cellulitis 05/30/2018  . Type 2 diabetes mellitus ([place]) 05/05/2018  . S/P PICC central line placement 02/04/2018  . Ulcers of both lower legs ([place]) 01/27/2018  . Bacteremia due to group B Streptococcus   . Hypocalcemia  08/03/2017  . Severe sepsis ([place]) 07/29/2017  . Wrist pain 01/30/2015  . De Quervain's tenosynovitis, right 01/03/2015  . Sick sinus syndrome ([place]) 07/02/2014  . Complete heart block ([place]) 07/02/2014  . Trigger finger, acquired 06/07/2014  . CKD (chronic kidney disease), stage III 05/13/2014  . Encounter for therapeutic drug monitoring 02/15/2014  . Thrombus 12/05/2013  . Ischemic leg 11/01/2013  . Low back pain 10/20/2013  . Depressive disorder, not elsewhere classified 10/20/2013  . Primary open angle glaucoma of both eyes, severe stage 08/06/2013  . Venous insufficiency 07/07/2013  . Postural dizziness 07/07/2013  . Encounter for long-term (current) use of other medications 06/27/2013  . Pseudophakia of both eyes 12/31/2012  . S/P placement of cardiac pacemaker 12/31/2012  . A-fib ([place]) 06/25/2011  . Pre-operative cardiovascular examination 04/29/2011  . 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 B12 DEFICIENCY 12/18/2010  . PSEUDOCYST, PANCREAS 12/17/2010  . DIASTOLIC HEART FAILURE, CHRONIC 04/16/2010  . SINUSITIS, CHRONIC 12/11/2009  . Obstructive sleep apnea 11/08/2009  . OBESITY-MORBID (>100') 10/29/2009  . HYPOGONADISM 10/01/2009  . 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 Hearing loss 04/26/2009  . FATTY LIVER DISEASE 06/20/2008  . COPD (chronic obstructive pulmonary disease) ([place]) 05/18/2008  . HYPERCHOLESTEROLEMIA 01/18/2008  . 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 Coronary atherosclerosis 07/06/2007  . GERD 07/06/2007    Past Surgical History:  Procedure Laterality Date  . BELPHAROPTOSIS  REPAIR Right    Glaucoma  . CARDIAC CATHETERIZATION     11-02-13  . CARDIOVERSION N/A 05/16/2014   Procedure: CARDIOVERSION;  Surgeon: Josue Hector, MD;  Location: [facility] ENDOSCOPY;  Service: Cardiovascular;  Laterality: N/A;  . CARDIOVERSION N/A 11/08/2014   Procedure: CARDIOVERSION;  Surgeon: Fay Records, MD;  Location: [facility];  Service:  Cardiovascular;  Laterality: N/A;  . CARDIOVERSION N/A 06/22/2015   Procedure: CARDIOVERSION;  Surgeon: Jerline Pain, MD;  Location: [facility];  Service: Cardiovascular;  Laterality: N/A;  . CHOLECYSTECTOMY  2012  . CYSTOSCOPY W/ URETERAL STENT PLACEMENT Right 10/26/2013   Procedure: CYSTOSCOPY WITH RETROGRADE PYELOGRAM/URETERAL STENT PLACEMENT;  Surgeon: Alexis Frock, MD;  Location: [facility] ORS;  Service: Urology;  Laterality: Right;  . CYSTOSCOPY WITH URETEROSCOPY AND STENT PLACEMENT Right 01/11/2014   Procedure: CYSTOSCOPY WITH URETEROSCOPY ,RIGHT RETROGRADE AND STENT CHANGE AND LASER OF URETERAL TUMOR;  Surgeon: Alexis Frock, MD;  Location: [facility]S;  Service: Urology;  Laterality: Right;  . CYSTOSCOPY/RETROGRADE/URETEROSCOPY Right 08/23/2014   Procedure: CYSTOSCOPY/RETROGRADE/ DIAGNOSTIC URETEROSCOPY/RIGHT RENAL STONE EXTRACTION;  Surgeon: Alexis Frock, MD;  Location: [facility];  Service: Urology;  Laterality: Right;  . EMBOLECTOMY Left 11/02/2013   Procedure: LEFT FEMORAL EMBOLECTOMY, LEFT FEMORAL ARTERY ENDARTERECTOMY WITH DACRON PATCH ANGIOPLASTY.;  Surgeon: Mal Misty, MD;  Location: [facility];  Service: Vascular;  Laterality: Left;  . EYE SURGERY Left    cataract  . GROIN DEBRIDEMENT Right 05/08/2015   Procedure: REMOVAL OF RIGHT GROIN MASS;  Surgeon: Angelia Mould, MD;  Location: [facility];  Service: Vascular;  Laterality: Right;  . HOLMIUM LASER APPLICATION Right 0/01/7252   Procedure: HOLMIUM LASER APPLICATION;  Surgeon: Alexis Frock, MD;  Location: [facility];  Service: Urology;  Laterality: Right;  . INSERT / REPLACE / REMOVE PACEMAKER  2011  . NASAL SEPTUM SURGERY  1967  . PACEMAKER PLACEMENT  06/2010   [facility] Accent RF DR, Model [id number] ( Serial number [id number])  . TEE WITHOUT CARDIOVERSION N/A 05/16/2014   Procedure: TRANSESOPHAGEAL ECHOCARDIOGRAM (TEE);  Surgeon: Josue Hector, MD;  Location[facility] ENDOSCOPY;  Service: Cardiovascular;  Laterality: N/A;  . thromboembolectomy  and four compartment fasciotomy Right 2009   leg  . TRANSURETHRAL RESECTION OF BLADDER TUMOR WITH GYRUS (TURBT-GYRUS) N/A 10/26/2013   Procedure: TRANSURETHRAL RESECTION OF BLADDER TUMOR WITH GYRUS (TURBT-GYRUS);  Surgeon: Alexis Frock, MD;  Location: [facility];  Service: Urology;  Laterality: N/A;  . TRANSURETHRAL RESECTION OF BLADDER TUMOR WITH GYRUS (TURBT-GYRUS) N/A 01/11/2014   Procedure: TRANSURETHRAL RESECTION OF BLADDER TUMOR WITH GYRUS (TURBT-GYRUS);  Surgeon: Alexis Frock, MD;  Location: [facility];  Service: Urology;  Laterality: N/A;  . WISDOM TOOTH EXTRACTION          Home Medications    Prior to Admission medications   Medication Sig Start Date End Date Taking? Authorizing Provider  acetaminophen (TYLENOL) 500 MG tablet Take 1,000 mg by mouth 2 (two) times daily as needed (pain/headache).     [provider]  albuterol (VENTOLIN HFA) 108 (90 Base) MCG/ACT inhaler Inhale 2 puffs into the lungs every 6 (six) hours as needed for wheezing or shortness of breath. 11/23/17   Parrett, Fonnie Mu, NP  amiodarone (PACERONE) 200 MG tablet TAKE 1 TABLET EVERY DAY 01/05/19   Burtis Junes, NP  Ascorbic Acid (VITAMIN C) 500 MG tablet Take 500 mg by mouth daily.     [provider]  Blood Glucose Monitoring Suppl (ACCU-CHEK AVIVA CONNECT) w/Device KIT 1 Package  by Does not apply route 2 (two) times daily. 09/07/18   Renato Shin, MD  brimonidine (ALPHAGAN P) 0.1 % SOLN Place 1 drop into the left eye 3 (three) times daily.     [provider]  cholecalciferol (VITAMIN D) 1000 UNITS tablet Take 1,000 Units by mouth daily.     [provider]  CINNAMON PO Take 2,000 mg by mouth daily.    [provider]  dextromethorphan (DELSYM) 30 MG/5ML liquid Take 90 mg by mouth 2 (two) times daily as needed for cough.    [provider]  Digestive Enzymes (MULTI-ENZYME) TABS Take 1 tablet by mouth daily.    [provider]  docusate sodium (COLACE)  100 MG capsule Take 100-200 mg by mouth daily as needed for mild constipation.     [provider]  dorzolamide (TRUSOPT) 2 % ophthalmic solution Place 1 drop into both eyes 2 (two) times daily.    [provider]  ELIQUIS 5 MG TABS tablet TAKE 1 TABLET TWICE DAILY 04/19/19   Burtis Junes, NP  fluticasone (FLONASE) 50 MCG/ACT nasal spray Place 2 sprays into both nostrils as needed (congestion).     [provider]  furosemide (LASIX) 40 MG tablet Take 1 tablet (40 mg total) by mouth 2 (two) times daily. 04/19/19   Burtis Junes, NP  glucose blood test strip Use as instructed to check blood sugar twice daily E11.69 09/07/18   Renato Shin, MD  guaiFENesin 200 MG tablet Take 400 mg by mouth 2 (two) times daily. 4109m in the morning and 6095min the evening     [provider]  insulin regular (NOVOLIN R RELION) 100 units/mL injection 4 times daily before meals 300-210-210-190 units 09/24/18   ElRenato ShinMD  Lactobacillus (ACIDOPHILUS) CAPS capsule Take 1 capsule by mouth every evening.     [provider]  Lancets (ACCU-CHEK SOFT TOUCH) lancets Use as instructed to check blood sugar twice daily E11.69 09/07/18   ElRenato ShinMD  levothyroxine (SYNTHROID) 75 MCG tablet TAKE 1 TABLET EVERY MORNING ON AN EMPTY STOMACH WITH WATER ONLY. NO FOOD OR OTHER MEDICATIONS FOR 30 MINUTES. 09/16/19   ClPleas KochNP  metoprolol tartrate (LOPRESSOR) 25 MG tablet Take 1 tablet (25 mg total) by mouth 2 (two) times daily. 04/19/19   GeBurtis JunesNP  Multiple Vitamin (MULTIVITAMIN WITH MINERALS) TABS tablet Take 1 tablet by mouth every evening.     [provider]  nitroGLYCERIN (NITROSTAT) 0.4 MG SL tablet Place 1 tablet (0.4 mg total) under the tongue every 5 (five) minutes as needed for chest pain. Please make yearly appt with Dr. AlRayann Hemanor July before anymore refills. 1st attempt 05/20/19 08/18/19  GeBurtis JunesNP  Omega-3 Fatty Acids (FISH  OIL) 1000 MG CAPS Take 1,000 mg by mouth daily.     [provider]  omeprazole (PRILOSEC) 40 MG capsule Take 1 capsule (40 mg total) by mouth daily. For heartburn. 04/05/19   ClPleas KochNP  rosuvastatin (CRESTOR) 40 MG tablet TAKE 1/2 TABLET (20 MG TOTAL) BY MOUTH EVERY EVENING. FOR CHOLESTEROL. 09/16/19   ClPleas KochNP  Timolol Maleate 0.5 % (DAILY) SOLN Place 1 drop into both eyes 2 (two) times daily.    [provider]  traMADol (ULTRAM) 50 MG tablet Take 2 tablets (100 mg total) by mouth 3 (three) times daily as needed. 04/13/19   GeBurtis JunesNP  travoprost, benzalkonium, (TRAVATAN) 0.004 %  ophthalmic solution Place 1 drop into the left eye at bedtime.    [provider]    Family History Family History  Problem Relation Age of Onset  . Liver cancer Mother        deceased age 58  . Cancer Mother        liver cancer  . Heart attack Father   . Colon cancer Neg Hx     Social History Social History   Tobacco Use  . Smoking status: Former Smoker    Packs/day: 2.00    Years: 50.00    Pack years: 100.00    Types: Cigarettes    Quit date: 12/08/2006    Years since quitting: 12.8  . Smokeless tobacco: Never Used  Substance Use Topics  . Alcohol use: No    Alcohol/week: 0.0 standard drinks    Comment: quit 3 years ago  . Drug use: No     Allergies   Actos [pioglitazone], Timolol, and Ceftriaxone   Review of Systems Review of Systems  Unable to perform ROS: Patient unresponsive     Physical Exam Updated Vital Signs BP (!) 80/52 (BP Location: Left Arm) Comment: MD aware  Pulse 62   Resp 14   SpO2 98%   Physical Exam Vitals signs and nursing note reviewed.  Constitutional:      General: He is not in acute distress.    Appearance: He is well-developed.  HENT:     Head: Normocephalic and atraumatic.  Eyes:     Conjunctiva/sclera: Conjunctivae normal.     Comments: Pupils 2 mm equal.  Neck:     Musculoskeletal: Neck  supple.  Cardiovascular:     Rate and Rhythm: Normal rate. Rhythm irregular.     Heart sounds: Murmur present.  Pulmonary:     Effort: Pulmonary effort is normal. No respiratory distress.     Breath sounds: Normal breath sounds.  Abdominal:     Palpations: Abdomen is soft.     Tenderness: There is no abdominal tenderness. There is no guarding.  Musculoskeletal:        General: Swelling present.     Right lower leg: Edema present.     Left lower leg: Edema present.  Skin:    General: Skin is warm and dry.  Neurological:     Comments: Patient is unresponsive to voice or stimuli.  When I opened his eyes they were not deviated and roved around a little bit.      ED Treatments / Results  Labs (all labs ordered are listed, but only abnormal results are displayed) Labs Reviewed  CBC WITH DIFFERENTIAL/PLATELET - Abnormal; Notable for the following components:      Result Value   WBC 12.0 (*)    Neutro Abs 9.3 (*)    All other components within normal limits  BASIC METABOLIC PANEL - Abnormal; Notable for the following components:   Sodium 130 (*)    Potassium 2.0 (*)    Chloride 84 (*)    Glucose, Bld 307 (*)    BUN 47 (*)    Creatinine, Ser 2.61 (*)    Calcium 8.8 (*)    GFR calc non Af Amer 23 (*)    GFR calc Af Amer 26 (*)    All other components within normal limits  LACTIC ACID, PLASMA - Abnormal; Notable for the following components:   Lactic Acid, Venous 2.5 (*)    All other components within normal limits  URINALYSIS, ROUTINE W REFLEX MICROSCOPIC -  Abnormal; Notable for the following components:   APPearance HAZY (*)    Glucose, UA 50 (*)    Hgb urine dipstick LARGE (*)    Protein, ur 30 (*)    Leukocytes,Ua LARGE (*)    RBC / HPF >50 (*)    WBC, UA >50 (*)    Bacteria, UA RARE (*)    All other components within normal limits  APTT - Abnormal; Notable for the following components:   aPTT 38 (*)    All other components within normal limits  HEPARIN LEVEL  (UNFRACTIONATED) - Abnormal; Notable for the following components:   Heparin Unfractionated >2.20 (*)    All other components within normal limits  HEPATIC FUNCTION PANEL - Abnormal; Notable for the following components:   Albumin 2.6 (*)    AST 58 (*)    ALT 55 (*)    Alkaline Phosphatase 180 (*)    All other components within normal limits  HEMOGLOBIN A1C - Abnormal; Notable for the following components:   Hgb A1c MFr Bld 9.9 (*)    All other components within normal limits  BASIC METABOLIC PANEL - Abnormal; Notable for the following components:   Sodium 133 (*)    Potassium 2.3 (*)    Chloride 89 (*)    Glucose, Bld 221 (*)    BUN 45 (*)    Creatinine, Ser 2.35 (*)    Calcium 8.3 (*)    GFR calc non Af Amer 26 (*)    GFR calc Af Amer 30 (*)    All other components within normal limits  BASIC METABOLIC PANEL - Abnormal; Notable for the following components:   Sodium 134 (*)    Potassium 2.3 (*)    Chloride 91 (*)    BUN 43 (*)    Creatinine, Ser 2.26 (*)    Calcium 8.4 (*)    GFR calc non Af Amer 27 (*)    GFR calc Af Amer 31 (*)    All other components within normal limits  BASIC METABOLIC PANEL - Abnormal; Notable for the following components:   Potassium 2.4 (*)    Chloride 94 (*)    BUN 43 (*)    Creatinine, Ser 2.06 (*)    Calcium 8.4 (*)    GFR calc non Af Amer 30 (*)    GFR calc Af Amer 35 (*)    All other components within normal limits  BRAIN NATRIURETIC PEPTIDE - Abnormal; Notable for the following components:   B Natriuretic Peptide 769.1 (*)    All other components within normal limits  CBC - Abnormal; Notable for the following components:   RBC 3.55 (*)    Hemoglobin 11.2 (*)    HCT 35.1 (*)    RDW 15.7 (*)    All other components within normal limits  CULTURE, BLOOD (ROUTINE X 2)  CULTURE, BLOOD (ROUTINE X 2)  SARS CORONAVIRUS 2 (TAT 6-24 HRS)  MRSA PCR SCREENING  URINE CULTURE  LACTIC ACID, PLASMA  MAGNESIUM  PHOSPHORUS  GLUCOSE, CAPILLARY   BASIC METABOLIC PANEL  BLOOD GAS, VENOUS  APTT  HEPARIN LEVEL (UNFRACTIONATED)    EKG None  Radiology No results found.  Procedures .Critical Care Performed by: Hayden Rasmussen, MD Authorized by: Hayden Rasmussen, MD   Critical care provider statement:    Critical care time (minutes):  45   Critical care time was exclusive of:  Separately billable procedures and treating other patients   Critical care was necessary  to treat or prevent imminent or life-threatening deterioration of the following conditions:  CNS failure or compromise, shock, dehydration and metabolic crisis   Critical care was time spent personally by me on the following activities:  Discussions with consultants, evaluation of patient's response to treatment, examination of patient, ordering and performing treatments and interventions, ordering and review of laboratory studies, ordering and review of radiographic studies, pulse oximetry, re-evaluation of patient's condition, obtaining history from patient or surrogate, review of old charts and development of treatment plan with patient or surrogate   I assumed direction of critical care for this patient from another provider in my specialty: no     (including critical care time)  Medications Ordered in ED Medications  potassium chloride 10 mEq in 100 mL IVPB (10 mEq Intravenous Not Given 10/10/19 0250)  amiodarone (PACERONE) tablet 200 mg (200 mg Oral Given 10/10/19 1045)  levothyroxine (SYNTHROID) tablet 75 mcg (75 mcg Oral Given 10/10/19 0507)  dorzolamide (TRUSOPT) 2 % ophthalmic solution 1 drop (1 drop Both Eyes Given 10/10/19 1045)  timolol (TIMOPTIC) 0.5 % ophthalmic solution 1 drop (1 drop Both Eyes Given 10/10/19 1045)  brimonidine (ALPHAGAN) 0.15 % ophthalmic solution 1 drop (1 drop Left Eye Given 10/10/19 1045)  latanoprost (XALATAN) 0.005 % ophthalmic solution 1 drop (1 drop Left Eye Given 10/09/19 2240)  rosuvastatin (CRESTOR) tablet 10 mg (has no  administration in time range)  insulin aspart (novoLOG) injection 0-20 Units (0 Units Subcutaneous Not Given 10/10/19 0837)  sodium chloride flush (NS) 0.9 % injection 3 mL (3 mLs Intravenous Given 10/10/19 1046)  lactated ringers 1,000 mL with potassium chloride 40 mEq infusion ( Intravenous New Bag/Given 10/10/19 0512)  acetaminophen (TYLENOL) tablet 650 mg (has no administration in time range)    Or  acetaminophen (TYLENOL) suppository 650 mg (has no administration in time range)  ondansetron (ZOFRAN) tablet 4 mg (has no administration in time range)    Or  ondansetron (ZOFRAN) injection 4 mg (has no administration in time range)  piperacillin-tazobactam (ZOSYN) IVPB 3.375 g (0 g Intravenous Stopped 10/10/19 0913)  Chlorhexidine Gluconate Cloth 2 % PADS 6 each (6 each Topical Given 10/09/19 2236)  MEDLINE mouth rinse (15 mLs Mouth Rinse Given 10/10/19 1046)  heparin ADULT infusion 100 units/mL (25000 units/216m sodium chloride 0.45%) (1,200 Units/hr Intravenous Rate/Dose Verify 10/10/19 0350)  ipratropium-albuterol (DUONEB) 0.5-2.5 (3) MG/3ML nebulizer solution 3 mL (3 mLs Nebulization Given 10/10/19 0828)  potassium chloride 10 MEQ/100ML IVPB (  Not Given 10/10/19 0248)  potassium chloride (KLOR-CON) CR tablet 40 mEq (has no administration in time range)  lactated ringers bolus 1,000 mL (0 mLs Intravenous Stopped 10/09/19 1747)  potassium chloride SA (KLOR-CON) CR tablet 40 mEq (40 mEq Oral Given 10/09/19 1920)  insulin aspart (novoLOG) injection 20 Units (20 Units Subcutaneous Given 10/09/19 2329)  sodium chloride 0.9 % bolus 1,000 mL ( Intravenous Stopped 10/10/19 0228)  potassium chloride SA (KLOR-CON) CR tablet 40 mEq (40 mEq Oral Given 10/10/19 0246)  sodium chloride 0.9 % bolus 1,000 mL ( Intravenous Rate/Dose Verify 10/10/19 0350)  dextrose 50 % solution (25 mLs  Given 10/10/19 0457)     Initial Impression / Assessment and Plan / ED Course  I have reviewed the triage vital signs and the  nursing notes.  Pertinent labs & imaging results that were available during my care of the patient were reviewed by me and considered in my medical decision making (see chart for details).  Clinical Course as of Oct 09 1102  Nancy Fetter Oct 09, 2019  1657 Discussed with authority here both Sonia Baller and also the nurse that went to evaluate him today.  It sounds like at that time he was going in and out of consciousness.  Reportedly he can actually ambulate yesterday.  His wife wants him to go to [facility] which is end of life care.  They did not feel he was appropriate for that the way he looked at the house but now that he is unresponsive here he possibly is a candidate for that.  They are reaching out to the wife to have that discussion with me.   [MB]  38 I was able to talk to his wife and she said she could not come up here because of her own medical issues.  She is hoping that we can give him some fluids and possibly get him to be able to go to [facility].   [MB]  1750 Patient had minimal improvement to his blood pressure after the first liter of fluid.   [MB]  6962 Went back to reevaluate the patient and now he is able to speak and he knows that he is at the hospital.  He knows he is too weak to return home.  He said his legs look a little bit better since the hospice nurses have been changing the dressings.  He said he is on an antibiotic for an arm infection that also looks better.   [MB]  9528 Starting to come back on the patient and his potassium is critically low at 2.0.  I have ordered him IV and p.o. potassium.  Lactic acid is also elevated 2.5.  Creatinine elevated but at his baseline.   [MB]  4132 Chest x-ray interpreted by me as no acute findings.   [MB]  1904 Discussed with [facility] hospitalist Dr. Jonnie Finner who will admit the patient.  We discussed indications for antibiotics and he said he would reach out to the patient's wife to see what her goals of care are.   [MB]    Clinical Course  User Index [MB] Hayden Rasmussen, MD        Final Clinical Impressions(s) / ED Diagnoses   Final diagnoses:  Altered mental status, unspecified altered mental status type  Hypotension, unspecified hypotension type  Hypokalemia    ED Discharge Orders    None       Hayden Rasmussen, MD 10/10/19 [phone]

## 2019-10-10 ENCOUNTER — Other Ambulatory Visit: Payer: Self-pay

## 2019-10-10 ENCOUNTER — Telehealth: Payer: Self-pay

## 2019-10-10 DIAGNOSIS — N189 Chronic kidney disease, unspecified: Secondary | ICD-10-CM

## 2019-10-10 DIAGNOSIS — A419 Sepsis, unspecified organism: Principal | ICD-10-CM

## 2019-10-10 DIAGNOSIS — E869 Volume depletion, unspecified: Secondary | ICD-10-CM

## 2019-10-10 DIAGNOSIS — N179 Acute kidney failure, unspecified: Secondary | ICD-10-CM

## 2019-10-10 DIAGNOSIS — G9341 Metabolic encephalopathy: Secondary | ICD-10-CM

## 2019-10-10 DIAGNOSIS — N390 Urinary tract infection, site not specified: Secondary | ICD-10-CM

## 2019-10-10 LAB — MAGNESIUM: Magnesium: 2 mg/dL (ref 1.7–2.4)

## 2019-10-10 LAB — BASIC METABOLIC PANEL
Anion gap: 14 (ref 5–15)
Anion gap: 13 (ref 5–15)
Anion gap: 14 (ref 5–15)
Anion gap: 12 (ref 5–15)
BUN: 45 mg/dL — ABNORMAL HIGH (ref 8–23)
BUN: 43 mg/dL — ABNORMAL HIGH (ref 8–23)
BUN: 43 mg/dL — ABNORMAL HIGH (ref 8–23)
BUN: 43 mg/dL — ABNORMAL HIGH (ref 8–23)
CO2: 30 mmol/L (ref 22–32)
CO2: 30 mmol/L (ref 22–32)
CO2: 28 mmol/L (ref 22–32)
CO2: 28 mmol/L (ref 22–32)
Calcium: 8.3 mg/dL — ABNORMAL LOW (ref 8.9–10.3)
Calcium: 8.4 mg/dL — ABNORMAL LOW (ref 8.9–10.3)
Calcium: 8.4 mg/dL — ABNORMAL LOW (ref 8.9–10.3)
Calcium: 7.9 mg/dL — ABNORMAL LOW (ref 8.9–10.3)
Chloride: 89 mmol/L — ABNORMAL LOW (ref 98–111)
Chloride: 91 mmol/L — ABNORMAL LOW (ref 98–111)
Chloride: 94 mmol/L — ABNORMAL LOW (ref 98–111)
Chloride: 93 mmol/L — ABNORMAL LOW (ref 98–111)
Creatinine, Ser: 2.35 mg/dL — ABNORMAL HIGH (ref 0.61–1.24)
Creatinine, Ser: 2.26 mg/dL — ABNORMAL HIGH (ref 0.61–1.24)
Creatinine, Ser: 2.06 mg/dL — ABNORMAL HIGH (ref 0.61–1.24)
Creatinine, Ser: 2.28 mg/dL — ABNORMAL HIGH (ref 0.61–1.24)
GFR calc Af Amer: 30 mL/min — ABNORMAL LOW (ref >60)
GFR calc Af Amer: 31 mL/min — ABNORMAL LOW (ref >60)
GFR calc Af Amer: 35 mL/min — ABNORMAL LOW (ref >60)
GFR calc Af Amer: 31 mL/min — ABNORMAL LOW (ref >60)
GFR calc non Af Amer: 26 mL/min — ABNORMAL LOW (ref >60)
GFR calc non Af Amer: 27 mL/min — ABNORMAL LOW (ref >60)
GFR calc non Af Amer: 30 mL/min — ABNORMAL LOW (ref >60)
GFR calc non Af Amer: 27 mL/min — ABNORMAL LOW (ref >60)
Glucose, Bld: 221 mg/dL — ABNORMAL HIGH (ref 70–99)
Glucose, Bld: 73 mg/dL (ref 70–99)
Glucose, Bld: 82 mg/dL (ref 70–99)
Glucose, Bld: 289 mg/dL — ABNORMAL HIGH (ref 70–99)
Potassium: 2.3 mmol/L — CL (ref 3.5–5.1)
Potassium: 2.3 mmol/L — CL (ref 3.5–5.1)
Potassium: 2.4 mmol/L — CL (ref 3.5–5.1)
Potassium: 3.3 mmol/L — ABNORMAL LOW (ref 3.5–5.1)
Sodium: 133 mmol/L — ABNORMAL LOW (ref 135–145)
Sodium: 134 mmol/L — ABNORMAL LOW (ref 135–145)
Sodium: 136 mmol/L (ref 135–145)
Sodium: 133 mmol/L — ABNORMAL LOW (ref 135–145)

## 2019-10-10 LAB — PHOSPHORUS: Phosphorus: 2.9 mg/dL (ref 2.5–4.6)

## 2019-10-10 LAB — GLUCOSE, CAPILLARY
Glucose-Capillary: 91 mg/dL (ref 70–99)
Glucose-Capillary: 216 mg/dL — ABNORMAL HIGH (ref 70–99)
Glucose-Capillary: 241 mg/dL — ABNORMAL HIGH (ref 70–99)
Glucose-Capillary: 280 mg/dL — ABNORMAL HIGH (ref 70–99)

## 2019-10-10 LAB — HEPATIC FUNCTION PANEL
ALT: 55 U/L — ABNORMAL HIGH (ref 0–44)
AST: 58 U/L — ABNORMAL HIGH (ref 15–41)
Albumin: 2.6 g/dL — ABNORMAL LOW (ref 3.5–5.0)
Alkaline Phosphatase: 180 U/L — ABNORMAL HIGH (ref 38–126)
Bilirubin, Direct: 0.2 mg/dL (ref 0.0–0.2)
Indirect Bilirubin: 0.6 mg/dL (ref 0.3–0.9)
Total Bilirubin: 0.8 mg/dL (ref 0.3–1.2)
Total Protein: 6.8 g/dL (ref 6.5–8.1)

## 2019-10-10 LAB — HEMOGLOBIN A1C
Hgb A1c MFr Bld: 9.9 % — ABNORMAL HIGH (ref 4.8–5.6)
Mean Plasma Glucose: 237.43 mg/dL

## 2019-10-10 LAB — CBC
HCT: 35.1 % — ABNORMAL LOW (ref 39.0–52.0)
Hemoglobin: 11.2 g/dL — ABNORMAL LOW (ref 13.0–17.0)
MCH: 31.5 pg (ref 26.0–34.0)
MCHC: 31.9 g/dL (ref 30.0–36.0)
MCV: 98.9 fL (ref 80.0–100.0)
Platelets: 160 10*3/uL (ref 150–400)
RBC: 3.55 MIL/uL — ABNORMAL LOW (ref 4.22–5.81)
RDW: 15.7 % — ABNORMAL HIGH (ref 11.5–15.5)
WBC: 8.5 10*3/uL (ref 4.0–10.5)
nRBC: 0 % (ref 0.0–0.2)

## 2019-10-10 LAB — URINE CULTURE: Culture: NO GROWTH

## 2019-10-10 LAB — APTT
aPTT: 38 seconds — ABNORMAL HIGH (ref 24–36)
aPTT: 108 seconds — ABNORMAL HIGH (ref 24–36)
aPTT: 123 seconds — ABNORMAL HIGH (ref 24–36)

## 2019-10-10 LAB — HEPARIN LEVEL (UNFRACTIONATED)
Heparin Unfractionated: >2.2 IU/mL — ABNORMAL HIGH (ref 0.30–0.70)
Heparin Unfractionated: 1.2 IU/mL — ABNORMAL HIGH (ref 0.30–0.70)
Heparin Unfractionated: 1.32 IU/mL — ABNORMAL HIGH (ref 0.30–0.70)

## 2019-10-10 LAB — MRSA PCR SCREENING: MRSA by PCR: NEGATIVE

## 2019-10-10 LAB — BRAIN NATRIURETIC PEPTIDE: B Natriuretic Peptide: 769.1 pg/mL — ABNORMAL HIGH (ref 0.0–100.0)

## 2019-10-10 LAB — LACTIC ACID, PLASMA: Lactic Acid, Venous: 1.9 mmol/L (ref 0.5–1.9)

## 2019-10-10 LAB — SARS CORONAVIRUS 2 (TAT 6-24 HRS): SARS Coronavirus 2: NEGATIVE

## 2019-10-10 MED ORDER — SODIUM CHLORIDE 0.9 % IV BOLUS
1000.0000 mL | Freq: Once | INTRAVENOUS | Status: AC
  Administered 2019-10-10: 1000 mL via INTRAVENOUS

## 2019-10-10 MED ORDER — LACTATED RINGERS IV BOLUS
500.0000 mL | Freq: Once | INTRAVENOUS | Status: AC
  Administered 2019-10-10: 500 mL via INTRAVENOUS

## 2019-10-10 MED ORDER — ALBUMIN HUMAN 25 % IV SOLN
25.0000 g | Freq: Once | INTRAVENOUS | Status: AC
  Administered 2019-10-10: 25 g via INTRAVENOUS
  Filled 2019-10-10: qty 50

## 2019-10-10 MED ORDER — HEPARIN (PORCINE) 25000 UT/250ML-% IV SOLN
1150.0000 [IU]/h | INTRAVENOUS | Status: DC
  Administered 2019-10-12: 950 [IU]/h via INTRAVENOUS
  Filled 2019-10-10: qty 250

## 2019-10-10 MED ORDER — POTASSIUM CHLORIDE 2 MEQ/ML IV SOLN
INTRAVENOUS | Status: AC
  Administered 2019-10-10: 21:00:00 via INTRAVENOUS
  Filled 2019-10-10 (×4): qty 1000

## 2019-10-10 MED ORDER — SODIUM CHLORIDE 0.9 % IV BOLUS
500.0000 mL | Freq: Once | INTRAVENOUS | Status: AC
  Administered 2019-10-10: 500 mL via INTRAVENOUS

## 2019-10-10 MED ORDER — DEXTROSE 50 % IV SOLN
INTRAVENOUS | Status: AC
  Administered 2019-10-10: 25 mL
  Filled 2019-10-10: qty 50

## 2019-10-10 MED ORDER — POTASSIUM CHLORIDE CRYS ER 10 MEQ PO TBCR
40.0000 meq | EXTENDED_RELEASE_TABLET | ORAL | Status: AC
  Administered 2019-10-10 (×2): 40 meq via ORAL
  Filled 2019-10-10 (×2): qty 4

## 2019-10-10 MED ORDER — HYDROMORPHONE HCL 1 MG/ML PO LIQD
0.5000 mg | Freq: Four times a day (QID) | ORAL | Status: DC | PRN
  Administered 2019-10-11: 0.5 mg via ORAL
  Filled 2019-10-10: qty 1

## 2019-10-10 MED ORDER — INSULIN ASPART 100 UNIT/ML ~~LOC~~ SOLN
0.0000 [IU] | Freq: Three times a day (TID) | SUBCUTANEOUS | Status: DC
  Administered 2019-10-10 – 2019-10-11 (×3): 3 [IU] via SUBCUTANEOUS
  Administered 2019-10-11: 7 [IU] via SUBCUTANEOUS
  Administered 2019-10-12: 2 [IU] via SUBCUTANEOUS
  Administered 2019-10-12 – 2019-10-13 (×3): 5 [IU] via SUBCUTANEOUS
  Administered 2019-10-13: 3 [IU] via SUBCUTANEOUS
  Administered 2019-10-13: 2 [IU] via SUBCUTANEOUS
  Administered 2019-10-14: 3 [IU] via SUBCUTANEOUS

## 2019-10-10 MED ORDER — GUAIFENESIN ER 600 MG PO TB12
600.0000 mg | ORAL_TABLET | Freq: Two times a day (BID) | ORAL | Status: DC | PRN
  Administered 2019-10-10 – 2019-10-15 (×8): 600 mg via ORAL
  Filled 2019-10-10 (×8): qty 1

## 2019-10-10 MED ORDER — INSULIN ASPART 100 UNIT/ML ~~LOC~~ SOLN
3.0000 [IU] | Freq: Once | SUBCUTANEOUS | Status: AC
  Administered 2019-10-10: 3 [IU] via SUBCUTANEOUS

## 2019-10-10 MED ORDER — HEPARIN (PORCINE) 25000 UT/250ML-% IV SOLN
1200.0000 [IU]/h | INTRAVENOUS | Status: DC
  Administered 2019-10-10: 1200 [IU]/h via INTRAVENOUS
  Filled 2019-10-10: qty 250

## 2019-10-10 MED ORDER — IPRATROPIUM-ALBUTEROL 0.5-2.5 (3) MG/3ML IN SOLN
3.0000 mL | Freq: Three times a day (TID) | RESPIRATORY_TRACT | Status: DC
  Administered 2019-10-10 (×3): 3 mL via RESPIRATORY_TRACT
  Filled 2019-10-10 (×3): qty 3

## 2019-10-10 MED ORDER — HYDROCERIN EX CREA
TOPICAL_CREAM | Freq: Every day | CUTANEOUS | Status: DC
  Administered 2019-10-10 – 2019-10-12 (×2): via TOPICAL
  Administered 2019-10-13: 1 via TOPICAL
  Administered 2019-10-14 – 2019-10-15 (×2): via TOPICAL
  Filled 2019-10-10: qty 113

## 2019-10-10 MED ORDER — POTASSIUM CHLORIDE CRYS ER 20 MEQ PO TBCR
40.0000 meq | EXTENDED_RELEASE_TABLET | Freq: Once | ORAL | Status: AC
  Administered 2019-10-10: 40 meq via ORAL
  Filled 2019-10-10: qty 2

## 2019-10-10 MED ORDER — INFLUENZA VAC A&B SA ADJ QUAD 0.5 ML IM PRSY
0.5000 mL | PREFILLED_SYRINGE | INTRAMUSCULAR | Status: AC
  Administered 2019-10-11: 0.5 mL via INTRAMUSCULAR
  Filled 2019-10-10: qty 0.5

## 2019-10-10 MED ORDER — POTASSIUM CHLORIDE 10 MEQ/100ML IV SOLN
INTRAVENOUS | Status: AC
  Filled 2019-10-10: qty 100

## 2019-10-10 MED ORDER — INSULIN ASPART 100 UNIT/ML ~~LOC~~ SOLN
0.0000 [IU] | Freq: Every day | SUBCUTANEOUS | Status: DC
  Administered 2019-10-10: 3 [IU] via SUBCUTANEOUS
  Administered 2019-10-11 – 2019-10-12 (×2): 4 [IU] via SUBCUTANEOUS
  Administered 2019-10-13: 3 [IU] via SUBCUTANEOUS

## 2019-10-10 NOTE — Progress Notes (Signed)
ANTICOAGULATION CONSULT NOTE - Follow Up Consult  Pharmacy Consult for heparin Indication: hx atrial fibrillation  Allergies  Allergen Reactions  . Actos [Pioglitazone] Swelling  . Timolol Other (See Comments)    Slow heart rate but tolerates Cosopt (dorzolamide-timolol) Pt has a rx for Timolol and uses it for glaucoma   . Ceftriaxone Rash    Patient Measurements: Height: 5\' 10"  (177.8 cm) Weight: 234 lb 2.1 oz (106.2 kg) IBW/kg (Calculated) : 73 Heparin Dosing Weight: 103 kg  Vital Signs: Temp: 98.2 F (36.8 C) (11/02 0700) Temp Source: Oral (11/02 0700) BP: 111/87 (11/02 0530) Pulse Rate: 64 (11/02 0530)  Labs: Recent Labs    10/09/19 1654 10/09/19 2320 10/10/19 0317  HGB 13.4  --   --   HCT 41.5  --   --   PLT 202  --   --   APTT  --  38*  --   HEPARINUNFRC  --  >2.20*  --   CREATININE 2.61* 2.35* 2.26*    Estimated Creatinine Clearance: 33.9 mL/min (A) (by C-G formula based on SCr of 2.26 mg/dL (H)).   Medications:  - on  Eliquis 5 mg bid PTA (Last dose taken on 11/1 at 11a)  Assessment: Patient's a 78 y.o M with hx afib on Eliquis PTA, presented to the ED on 11/1 with AMS.  She was started on abx for suspected sepsis secondary to UTI and anticoag. transitioned to heparin drip on admission.  Today, 10/10/2019: - heparin level is elevated at 1.20 but this is likely from residual effect of Eliquis, aPTT is elevated at 108; will adjust heparin drip based on aPTT levels for now - cbc  - no bleeding documented  Goal of Therapy:  Heparin level 0.3-0.7 units/ml aPTT 66-102 seconds Monitor platelets by anticoagulation protocol: Yes   Plan:  - decrease heparin drip to 1100 units/hr - check 8 hr aPTT - monitor for s/s bleeding  Sima Lindenberger P 10/10/2019,9:10 AM

## 2019-10-10 NOTE — Progress Notes (Signed)
Manufacturing engineer (ACC) GIP RN note.  This is a related and covered GIP admission of 10/09/2019 with ACC diagnosis of CHF. Patient has an [place] DNR. Patient became hypotensive and had altered mental status. Family activated EMS and patient was brought to ED for evaluation. Patient was admitted for hypotension, altered mental status and elevated blood glucose.   Spoke with patient's wife and patient separately. Both have concerns about how he can be cared for after discharge. Patient's wife states she is unable to care for him alone. Patient is alert and oriented today, and expresses anxiety related to his hospitalization and future care.   VS: 98, 118/90, 96, 17, 100% on 4Lnc I/O: 1620.04/1049  Abn labs: Na 133, K+ 3.3, Chloride 93, Glucose 289, BUN 43, Creatinine 2.28, Calcium 7.9, RBC 3.55, Hgb 11.2, HCT 35.1, RDW 15.7, GFR 30  Continuous medications and PRNs:  Heparin 41ml/1100units /hr, Zosyn 3.375mg  IVPB Q8hrs  Per MD notes: Assessment & Plan:   Active Problems:   Hyperosmolar hyperglycemic state (HHS) ([place])  Sepsis secondary to UTI: -Hypotension has resolved with volume resuscitation. -Follow cultures. -Continue IV Zosyn for now. -Trend lactic acid level. -Consult palliative care to define goal of care. -Further management will depend on hospital course.  Acute encephalopathy: -Likely secondary to combined toxic and metabolic encephalopathy. -Manage sepsis. -Corotto volume depletion. -Monitor and correct abnormal electrolytes. -Acute encephalopathy has resolved significantly.  Hypokalemia: -Continue to monitor and replete.   -Last potassium documented was 2.4. -K-Dur 40 M EQ every 4 hours x2 doses -Repeat renal panel afterwards.  AKI on chronic kidney disease stage III: -Likely prerenal.   -Serum creatinine is improving.  Serum creatinine is down to 2.06.  Baseline serum creatinine ranges from 1.6 to 2.   Volume depletion: Continue volume resuscitation  cautiously. Further management depend on hospital course.  Paroxysmal Afib -Eliquis is on hold.   -Currently on heparin drip.   -We will transition back to Eliquis fairly soon (if renal function continues to improve, no procedures planned).    Communication with PCG: as noted above.  Communication with IDG: Team updated. Goals of Care: Patient is DNR. Concerns moving forward of who will care for him as his wife is unable to continue to care for patient at home without additional assistance. ACC will continue follow patient while hospitalized and assist with discharge planning.   Please use [facility] if ambulance transport is needed at time of discharge.   Please call with any hospice related questions.   Thank you, Charlett Blake, [facility], listed on [place] [phone]

## 2019-10-10 NOTE — Evaluation (Signed)
Physical Therapy Evaluation Patient Details Name: [name] MRN: [id number] DOB: 10/07/1941 Today's Date: 10/10/2019   History of Present Illness  Pt is 78 yo male admitted with AMS, hypoactive delirium and hypotension.  He was current with home hospice services.  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  Pt with afib and on heparin drip for afib - ok for PT per RN.  Clinical Impression  Pt was admitted with above diagnosis.  He was able to participate with PT, but limited by fatiguing easily and required assist of 2.  Pt with complex medical history and was with hospice at home - rehab progress is guarded; however, he was ambulatory 1 week ago and is not able to return home safely, so will benefit from further therapy.  He presents with impairments listed below (see PT problem list) and will benefit from acute PT services to address.     Follow Up Recommendations SNF    Equipment Recommendations       Recommendations for Other Services       Precautions / Restrictions Precautions Precautions: Fall      Mobility  Bed Mobility Overal bed mobility: Needs Assistance Bed Mobility: Rolling;Supine to Sit;Sit to Supine Rolling: Mod assist;+2 for physical assistance;+2 for safety/equipment   Supine to sit: Mod assist;+2 for safety/equipment;+2 for physical assistance Sit to supine: Mod assist;+2 for safety/equipment;+2 for physical assistance   General bed mobility comments: increased time; cues; HOB elevated  Transfers Overall transfer level: Needs assistance Equipment used: Rolling walker (2 wheeled) Transfers: Sit to/from Stand Sit to Stand: Mod assist;+2 physical assistance;+2 safety/equipment         General transfer comment: cued for hand placement  Ambulation/Gait Ambulation/Gait  assistance: Mod assist;+2 physical assistance;+2 safety/equipment Gait Distance (Feet): 2 Feet Assistive device: Rolling walker (2 wheeled) Gait Pattern/deviations: Shuffle     General Gait Details: side steps to [facility]; assist with RW; fatigue easily - legs shaking  Stairs            Wheelchair Mobility    Modified Rankin (Stroke Patients Only)       Balance Overall balance assessment: Needs assistance Sitting-balance support: Bilateral upper extremity supported;Feet supported Sitting balance-Leahy Scale: Fair     Standing balance support: Bilateral upper extremity supported Standing balance-Leahy Scale: Fair                               Pertinent Vitals/Pain Pain Assessment: No/denies pain(Pt moaned with transfers, but stated "no pain just stiffness")    Home Living Family/patient expects to be discharged to:: Private residence Living Arrangements: Spouse/significant other Available Help at Discharge: Family(Pt lives with wife but she is unable to assist; had hospice nurse 1 day a week for <1 hour) Type of Home: House Home Access: Stairs to enter Entrance Stairs-Rails: Right Entrance Stairs-Number of Steps: 4 Home Layout: One level Home Equipment: [place] - 4 wheels;Bedside commode;Grab bars - tub/shower;Shower seat;Cane - single point      Prior Function Level of Independence: Needs assistance   Gait / Transfers Assistance Needed: Pt reports that up until the last week he could ambulate in the house with a rollator.  Over the last week, has gotten weaker and unable to get out of recliner.  ADL's /  Homemaking Assistance Needed: Up until the past week he could perform ADLs        Hand Dominance        Extremity/Trunk Assessment   Upper Extremity Assessment Upper Extremity Assessment: RUE deficits/detail;LUE deficits/detail RUE Deficits / Details: MMT: 4/5 throughout; shoulder elevation ~90 degrees LUE Deficits / Details: MMT: 4/5 throughout;  shoulder elevation ~90 degrees    Lower Extremity Assessment Lower Extremity Assessment: RLE deficits/detail;LLE deficits/detail RLE Deficits / Details: ROM WFL: MMT : 4/5 throughout LLE Deficits / Details: ROM WFL: MMT : 4/5 throughout    Cervical / Trunk Assessment Cervical / Trunk Assessment: Kyphotic(Extreme forward head)  Communication   Communication: HOH  Cognition Arousal/Alertness: Lethargic Behavior During Therapy: WFL for tasks assessed/performed Overall Cognitive Status: Within Functional Limits for tasks assessed                                        General Comments General comments (skin integrity, edema, etc.): HR 90-105 bpm throughout treatment;  O2 monitor on ear and kept losing signal - when held in place reading 100% on O2    Exercises     Assessment/Plan    PT Assessment Patient needs continued PT services  PT Problem List Decreased strength;Decreased mobility;Decreased safety awareness;Decreased range of motion;Decreased activity tolerance;Decreased balance;Cardiopulmonary status limiting activity;Decreased knowledge of use of DME       PT Treatment Interventions DME instruction;Therapeutic exercise;Gait training;Balance training;Stair training;Neuromuscular re-education;Functional mobility training;Therapeutic activities;Patient/family education    PT Goals (Current goals can be found in the Care Plan section)       Frequency Min 3X/week   Barriers to discharge Decreased caregiver support wife unable to provide physical assist    Co-evaluation               AM-PAC PT "6 Clicks" Mobility  Outcome Measure Help needed turning from your back to your side while in a flat bed without using bedrails?: A Lot Help needed moving from lying on your back to sitting on the side of a flat bed without using bedrails?: A Lot Help needed moving to and from a bed to a chair (including a wheelchair)?: A Lot Help needed standing up from a  chair using your arms (e.g., wheelchair or bedside chair)?: A Lot Help needed to walk in hospital room?: A Lot Help needed climbing 3-5 steps with a railing? : Total 6 Click Score: 11    End of Session Equipment Utilized During Treatment: Gait belt Activity Tolerance: Patient limited by fatigue Patient left: in bed;with call bell/phone within reach;with bed alarm set Nurse Communication: Mobility status PT Visit Diagnosis: Unsteadiness on feet (R26.81);Other abnormalities of gait and mobility (R26.89);Muscle weakness (generalized) (M62.81)    Time: [id number] PT Time Calculation (min) (ACUTE ONLY): 27 min   Charges:   PT Evaluation $PT Eval High Complexity: 1 High          Maggie Font, PT Acute Rehab Services [phone]   Karlton Lemon 10/10/2019, 5:21 PM

## 2019-10-10 NOTE — Progress Notes (Signed)
Palliative Medicine RN Note: Called Bevely Palmer, Manufacturing engineer [facility]; formerly [facility] liaison. They are aware that patient has been admitted to [facility] and will be reviewing admission.   Generally, ACC addressed GOC on their patients. PMT will continue to shadow and coordinate with ACC in case our help is needed.  Marjie Skiff Sinjin Amero, RN, BSN, [facility] Palliative Medicine Team 10/10/2019 11:26 AM Office [phone]

## 2019-10-10 NOTE — Progress Notes (Signed)
Hypoglycemic Event  CBG:45  Treatment: 1/2 Amp D50  Symptoms:  N/A  Follow-up CBG: Time: 0540 CBG Result: 79  Possible Reasons for Event:  NPO   Comments/MD notified:    Tyrone Nine

## 2019-10-10 NOTE — Progress Notes (Signed)
Patient's BP 55/30 at 1730 automatic, manual BP 80/45. Verbal orders from MD for 1L NS Bolus.

## 2019-10-10 NOTE — Telephone Encounter (Signed)
Per chart review tab pt was admitted to [facility] on 10/09/19.

## 2019-10-10 NOTE — Progress Notes (Addendum)
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   Patient was under hospice care at home.  Patient was admitted with altered mental status and poorly controlled blood sugar (hyper/hypoglycemia).  On presentation, patient was hypotensive as well.  Patient has been volume resuscitated.  Current blood pressure is 111/87 mmHg.  UA suggestive of likely UTI.  Low potassium was noted on presentation.  Last documented potassium was 2.4.  Worsening kidney function on presentation, but slowly improving.  Last renal function revealed serum creatinine is trending towards baseline.  10/10/2019: Altered mental status has improved significantly.  Patient denies fever or chills.  Patient reports nonproductive cough.  Chest x-ray has not shown any new changes.  Cultures are still pending.  Leukocytosis as results, WBC is down from 12 to 8.5.  Patient is currently on IV Zosyn.  Patient reports that the wife and family are no longer able to look after him at home.  Will consult PT, OT, case management versus social work team.  We will also consult palliative care team to define goal of care.  Further management will depend on hospital course.  Assessment & Plan:   Active Problems:   Hyperosmolar hyperglycemic state (HHS) ([place])  Sepsis secondary to UTI: -Hypotension has resolved with volume resuscitation. -Follow cultures. -Continue IV Zosyn for now. -Trend lactic acid level. -Consult palliative care to define goal of care. -Further management will depend on hospital course.  Acute encephalopathy: -Likely secondary to combined toxic and  metabolic encephalopathy. -Manage sepsis. -Corotto volume depletion. -Monitor and correct abnormal electrolytes. -Acute encephalopathy has resolved significantly.  Hypokalemia: -Continue to monitor and replete.   -Last potassium documented was 2.4. -K-Dur 40 M EQ every 4 hours x2 doses -Repeat renal panel afterwards.  AKI on chronic kidney disease stage III: -Likely prerenal.   -Serum creatinine is improving.  Serum creatinine is down to 2.06.  Baseline serum creatinine ranges from 1.6 to 2.   Volume depletion: Continue volume resuscitation cautiously. Further management depend on hospital course.  Paroxysmal Afib -Eliquis is on hold.   -Currently on heparin drip.   -We will transition back to Eliquis fairly soon (if renal function continues to improve, no procedures planned).     DVT prophylaxis: Heparin drip Code Status: DO NOT RESUSCITATE Family Communication:  Disposition Plan: This will depend on hospital course.  Patient was under hospice care.  Will consult palliative care team.  They have been need to define goal of care.  Patient tells me that the wife and family no longer able to look after him.  Consult PT OT.  Consult case management and possible social work team.   Consultants:   Palliative care  Procedures:   None  Antimicrobials:   IV Zosyn   Subjective: Altered mental status has resolved. No fever or chills.  Objective: Vitals:   10/10/19 0409 10/10/19 0530 10/10/19 0700 10/10/19 0828  BP:  111/87    Pulse:  64    Resp:  17    Temp:   98.2 F (36.8 C)   TempSrc:   Oral   SpO2:  98%  100%  Weight: 106.2  kg     Height:        Intake/Output Summary (Last 24 hours) at 10/10/2019 0927 Last data filed at 10/10/2019 0350 Gross per 24 hour  Intake 1620.53 ml  Output 1050 ml  Net 570.53 ml   Filed Weights   10/09/19 1652 10/09/19 2200 10/10/19 0409  Weight: 130 kg 106.2 kg 106.2 kg    Examination:  General exam: Appears calm and  comfortable.  Chronically ill looking. Respiratory system: Clear to auscultation. Cardiovascular system: S1 & S2 heard. Gastrointestinal system: Abdomen is nondistended, soft and nontender.  Central nervous system: Awake and alert.  Patient moves all extremities.   Extremities: Lower leg is wrapped.    Data Reviewed: I have personally reviewed following labs and imaging studies  CBC: Recent Labs  Lab 10/09/19 1654  WBC 12.0*  NEUTROABS 9.3*  HGB 13.4  HCT 41.5  MCV 95.6  PLT [id number]   Basic Metabolic Panel: Recent Labs  Lab 10/07/19 0800 10/09/19 1654 10/09/19 2320 10/10/19 0317 10/10/19 0732  NA 132* 130* 133* 134* 136  K 2.4* 2.0* 2.3* 2.3* 2.4*  CL 91* 84* 89* 91* 94*  CO2 26 32 30 30 28   GLUCOSE 519* 307* 221* 73 82  BUN 38* 47* 45* 43* 43*  CREATININE 2.36* 2.61* 2.35* 2.26* 2.06*  CALCIUM 8.2* 8.8* 8.3* 8.4* 8.4*  MG  --   --  2.0  --   --   PHOS  --   --  2.9  --   --    GFR: Estimated Creatinine Clearance: 37.2 mL/min (A) (by C-G formula based on SCr of 2.06 mg/dL (H)). Liver Function Tests: Recent Labs  Lab 10/07/19 0800 10/09/19 2320  AST 61* 58*  ALT 63* 55*  ALKPHOS 266* 180*  BILITOT 0.5 0.8  PROT 6.2 6.8  ALBUMIN 3.1* 2.6*   No results for input(s): LIPASE, AMYLASE in the last 168 hours. No results for input(s): AMMONIA in the last 168 hours. Coagulation Profile: No results for input(s): INR, PROTIME in the last 168 hours. Cardiac Enzymes: No results for input(s): CKTOTAL, CKMB, CKMBINDEX, TROPONINI in the last 168 hours. BNP (last 3 results) No results for input(s): PROBNP in the last 8760 hours. HbA1C: Recent Labs    10/09/19 2320  HGBA1C 9.9*   CBG: No results for input(s): GLUCAP in the last 168 hours. Lipid Profile: No results for input(s): CHOL, HDL, LDLCALC, TRIG, CHOLHDL, LDLDIRECT in the last 72 hours. Thyroid Function Tests: No results for input(s): TSH, T4TOTAL, FREET4, T3FREE, THYROIDAB in the last 72 hours. Anemia Panel:  No results for input(s): VITAMINB12, FOLATE, FERRITIN, TIBC, IRON, RETICCTPCT in the last 72 hours. Urine analysis:    Component Value Date/Time   COLORURINE YELLOW 10/09/2019 [place] (A) 10/09/2019 1835   LABSPEC 1.008 10/09/2019 1835   PHURINE 6.0 10/09/2019 1835   GLUCOSEU 50 (A) 10/09/2019 1835   GLUCOSEU 100 09/30/2013 1535   HGBUR LARGE (A) 10/09/2019 [place] 10/09/2019 [place] 10/09/2019 1835   PROTEINUR 30 (A) 10/09/2019 1835   UROBILINOGEN 1.0 10/01/2013 1003   NITRITE NEGATIVE 10/09/2019 1835   LEUKOCYTESUR LARGE (A) 10/09/2019 1835   Sepsis Labs: @LABRCNTIP (procalcitonin:4,lacticidven:4)  ) Recent Results (from the past 240 hour(s))  Culture, blood (routine x 2)     Status: None (Preliminary result)   Collection Time: 10/09/19  4:55 PM   Specimen: BLOOD  Result Value Ref Range Status   Specimen Description  Final    BLOOD RIGHT WRIST Performed at [facility] [address], [place], [place] [address]    Special Requests   Final    BOTTLES DRAWN AEROBIC AND ANAEROBIC Blood Culture adequate volume Performed at [facility] [address], [place], [place] [address]    Culture   Final    NO GROWTH < 12 HOURS Performed at [facility] [address], [place], [place] [address]    Report Status PENDING  Incomplete  Culture, blood (routine x 2)     Status: None (Preliminary result)   Collection Time: 10/09/19  5:00 PM   Specimen: BLOOD RIGHT FOREARM  Result Value Ref Range Status   Specimen Description   Final    BLOOD RIGHT FOREARM Performed at [facility] [address] [place], [place] [address]    Special Requests   Final    BOTTLES DRAWN AEROBIC AND ANAEROBIC Blood Culture adequate volume Performed at [facility] [address], [place], [place] [address]    Culture   Final    NO GROWTH < 12 HOURS Performed  at [facility] [address], [place], [place], [place] [address]    Report Status PENDING  Incomplete  SARS CORONAVIRUS 2 (TAT 6-24 HRS) Nasopharyngeal Nasopharyngeal Swab     Status: None   Collection Time: 10/09/19  5:34 PM   Specimen: Nasopharyngeal Swab  Result Value Ref Range Status   SARS Coronavirus 2 NEGATIVE NEGATIVE Final    Comment: (NOTE) SARS-CoV-2 target nucleic acids are NOT DETECTED. The SARS-CoV-2 RNA is generally detectable in upper and lower respiratory specimens during the acute phase of infection. Negative results do not preclude SARS-CoV-2 infection, do not rule out co-infections with other pathogens, and should not be used as the sole basis for treatment or other patient management decisions. Negative results must be combined with clinical observations, patient history, and epidemiological information. The expected result is Negative. Fact Sheet for Patients: SugarRoll.be Fact Sheet for Healthcare Providers: https://www.woods-mathews.com/ This test is not yet approved or cleared by the [place] FDA and  has been authorized for detection and/or diagnosis of SARS-CoV-2 by FDA under an Emergency Use Authorization (EUA). This EUA will remain  in effect (meaning this test can be used) for the duration of the COVID-19 declaration under Section 56 4(b)(1) of the Act, 21 U.S.C. section 360bbb-3(b)(1), unless the authorization is terminated or revoked sooner. Performed at [facility], [place] [address], [place],  [address]   MRSA PCR Screening     Status: None   Collection Time: 10/09/19 10:00 PM   Specimen: Nasal Mucosa; Nasopharyngeal  Result Value Ref Range Status   MRSA by PCR NEGATIVE NEGATIVE Final    Comment:        The GeneXpert MRSA Assay (FDA approved for NASAL specimens only), is one component of a comprehensive MRSA colonization surveillance program. It is not intended to diagnose MRSA  infection nor to guide or monitor treatment for MRSA infections. Performed at [facility], [place] [address], [place],  [address]          Radiology Studies: Dg Chest Port 1 View  Result Date: 10/09/2019 CLINICAL DATA:  Altered mental status. EXAM: PORTABLE CHEST 1 VIEW COMPARISON:  06/02/2018 FINDINGS: Stable borderline cardiomegaly. Dual lead transvenous pacemaker remains in appropriate position. Aortic atherosclerosis. Both lungs are clear. IMPRESSION: Stable borderline cardiomegaly. No active lung disease. Electronically Signed  By: Marlaine Hind M.D.   On: 10/09/2019 17:21        Scheduled Meds: . amiodarone  200 mg Oral Daily  . brimonidine  1 drop Left Eye TID  . Chlorhexidine Gluconate Cloth  6 each Topical Daily  . dorzolamide  1 drop Both Eyes BID  . insulin aspart  0-20 Units Subcutaneous Q4H  . ipratropium-albuterol  3 mL Nebulization TID  . latanoprost  1 drop Left Eye QHS  . levothyroxine  75 mcg Oral Q0600  . mouth rinse  15 mL Mouth Rinse BID  . rosuvastatin  10 mg Oral q1800  . sodium chloride flush  3 mL Intravenous Q12H  . timolol  1 drop Both Eyes BID   Continuous Infusions: . heparin 1,200 Units/hr (10/10/19 0350)  . piperacillin-tazobactam (ZOSYN)  IV 3.375 g (10/10/19 0513)  . potassium chloride       LOS: 1 day    Time spent: 35 Minutes.    Dana Allan, MD  [facility] Pager #: [phone] 7PM-7AM contact night coverage as above

## 2019-10-10 NOTE — Progress Notes (Signed)
Patient sleeping when I went by to see him this afternoon.  Called pt's wife to clarify home insulin regimen.  Pt's wife told me that pt has been taking approximately 600 units total insulin (Novolin R (Regular) U100 insulin) in divided doses for the last few weeks.  Per wife, pt checks his CBG and gives himself insulin based on the CBG reading and food intake.  Per wife, the Hospice nurses bring him his insulin.  Not sure if PCP or Hospice MD is managing pt's insulin doses at home.  Per wife, pt has been having severe low CBGs at home on occasion.  EMS has been called recently for HYPO at home.    Pt on very unusual insulin regimen.  Extremely large doses of Regular Insulin without basal insulin.  May fare better with Regular Insulin SSI + basal Insulin.    --Will follow patient during hospitalization--  Wyn Quaker RN, MSN, CDE Diabetes Coordinator Inpatient Glycemic Control Team Team Pager: [phone] (8a-5p)

## 2019-10-10 NOTE — Telephone Encounter (Signed)
[facility] Night - Client TELEPHONE ADVICE RECORD Access[facility]urse Patient Name: [name] Gender: Male DOB: [birth year] Age: 78 Y 64 M 30 D Return Phone Number: [phone] (Primary) Address[place] [address] Client [place] Night - Client Client Site [place] Physician Alma Friendly - NP Contact Type Call Who Is Calling Lab Lab Name [facility] Lab Phone Number [phone] opt 1 Lab Tech Name [facility] Lab Reference Number [phone] Chief Complaint Lab Result (Critical or Stat) Call Type Lab Send to RN Reason for Call Report lab results Initial Comment Critical results. Additional Comment Pt's phone number and zip code provided by lab tech. Translation No Nurse Assessment Nurse: Yolanda Bonine. RN, Sunday Spillers Date/Time Eilene Ghazi Time): 10/08/2019 4:36:56 AM Is there an on-call provider listed? ---Yes Please list name of person reporting value (Lab Employee) and a contact number. ---[facility] Please document the following items: Lab name Lab value (read back to lab to verify) Reference range for lab value Date and time blood was drawn Collect time of birth for bilirubin results ---glucose 519 ref range 65-99 Potassium 2.4 ref 3.5-5.2 collected 10/07/19 800am Please collect the patient contact information from the lab. (name, phone number and address) ---[name] 03/23/1941 Guidelines Guideline Title Affirmed Question Affirmed Notes Nurse Date/Time ([place] Time) Disp. Time Eilene Ghazi Time) Disposition Final User 10/08/2019 4:42:56 AM Called On-Call Provider Yolanda Bonine. RN, Sunday Spillers 10/08/2019 4:43:37 AM Lab Call Yolanda Bonine. RN, Sunday Spillers Reason: critical lab 10/08/2019 4:43:46 AM Clinical Call Yes Yolanda Bonine. RN, Sunday Spillers

## 2019-10-10 NOTE — Telephone Encounter (Signed)
Noted and reviewed. 

## 2019-10-10 NOTE — Progress Notes (Addendum)
Inpatient Diabetes Program Recommendations  AACE/ADA: New Consensus Statement on Inpatient Glycemic Control (2015)  Target Ranges:  Prepandial:   less than 140 mg/dL      Peak postprandial:   less than 180 mg/dL (1-2 hours)      Critically ill patients:  140 - 180 mg/dL   Results for PANAGIOTIS, [name] (MRN [id number]) as of 10/10/2019 08:17  Ref. Range 10/09/2019 16:54 10/09/2019 23:20 10/10/2019 03:17  Glucose Latest Ref Range: 70 - 99 mg/dL 307 (H) 221 (H)  27 units NOVOLOG given at 11:30pm and then   4 units NOVOLOG given at Midnight 73    Admit with: AMS/ HHS/ Possible Sepsis secondary to UTI/ Severe Hypokalemia  History: DM, CKD, Bladder cancer, CHF  Home DM Meds: Regular Insulin (U100): 300 units AM, 210 units Lunch, 210 units Dinner, 190 units Bedtime  Current Orders: Novolog Resistant Correction Scale/ SSI (0-20 units) Q4 hours      Wife reports poor PO intake at home over the last few weeks with poor glucose control  Hospice services at home.  Used to see Dr. [name] with [name] for diabetes care.  Hasn't seen ENDO since October 2019.   Severe Hypoglycemic event this AM per nursing notes--CBG 45 mg/dl at 5:40 am today.  Note patient currently receiving Novolog SSI Q4H.      --Will follow patient during hospitalization--  Wyn Quaker RN, MSN, CDE Diabetes Coordinator Inpatient Glycemic Control Team Team Pager: [phone] (8a-5p)

## 2019-10-10 NOTE — Consult Note (Addendum)
Page Nurse wound consult note I am assisted in my consultation today by Bedside RN, Bubba Hales. Reason for Consult:Patient with bilateral peripheral vascular disease related wounds. Chronic, non healing. Has been seen by outpatient wound care in the past. Not sure of last encounter. Patient with Hospice as a Provider in the community; may be considering a change in locale of care as wife and family are no longer able to safely care for him at home. Wound type: PVD, partial thickness skin flaking, cracking with infection to LEs, interdigital spaces of toes are cracked and with poor hygiene Pressure Injury POA: N/A Measurement:N/A Wound bed: areas are pink, moist but not draining Drainage (amount, consistency, odor) None Periwound:with erythema, edema Dressing procedure/placement/frequency: I will provide conservative orders for daily care using a soap and water cleanse, then rinse to clean LEs, Eucerin cream to moisturize applied in a thin layer, silver hydrofiber (Aquacel Ag+) "strips" to be placed between toes and legs wrapped with Kerlix roll gauze and secured with paper tape. The feet are to be placed in Prevalon Boots for pressure redistribution while in bed to prevent pressure injuries to heels (which would be difficult to heal and painful).  A silicone foam dressing is to be placed to the sacrum prophylactically.  [facility] nursing team will not follow, but will remain available to this patient, the nursing and medical teams.  Please re-consult if needed. Thanks, Maudie Flakes, MSN, RN, [place], Arther Abbott  Pager# [phone]

## 2019-10-10 NOTE — Progress Notes (Signed)
Received patient from ED with multiple chronic lower extremity wounds. Several areas on feet and toes bilaterally ranging from pin sized to quarter sized openings ( in between toes on R foot looks to be necrotic). Right thigh has scaly, dry reddened area, multiple abrasions to back and arms bilaterally, red blanchable area on sacrum and under buttocks area. Per ED report, legs were wrapped by hospice nurse and per pt report "seems to be healing." Legs look scaly, dry, reddened and cool to the touch. Scratch marks on abdomen bilaterally with generalized ecchymosis. RN cleansed areas with CHG. RN will continue to monitor.

## 2019-10-10 NOTE — Progress Notes (Signed)
Initial Nutrition Assessment  RD working remotely.  DOCUMENTATION CODES:   Obesity unspecified  INTERVENTION:   - Glucerna Shake po TID, each supplement provides 220 kcal and 10 grams of protein  - Pro-stat 30 ml BID, each supplement provides 100 kcal and 15 grams of protein  - Encourage adequate PO intake  - Will d/c calorie count as pt is under hospice care and poor glucose control likely related to unusual home insulin regimen.  NUTRITION DIAGNOSIS:   Increased nutrient needs related to chronic illness (CHF, COPD) as evidenced by estimated needs.  GOAL:   Patient will meet greater than or equal to 90% of their needs  MONITOR:   PO intake, Supplement acceptance, Labs, Weight trends, Skin  REASON FOR ASSESSMENT:   Consult Calorie Count  ASSESSMENT:   78 year old male who presented to the ED on 11/01 with AMS. Pt is followed at home by [facility]. PMH of CHF, atrial fibrillation, T2DM, CKD stage III, bladder cancer, GBS bacteremia in January 2019, PVD with chronic lower extremity wounds, COPD, GERD, HLD. Pt admitted with sepsis secondary to UTI.   Per review of notes, pt's wife reports poor PO intake at home over the last few weeks with poor glucose control.  No meal completions documented since admission.  Weight on admission recorded as 130 kg. However, today's weight recorded as 106.2 kg. Unsure which weight is most accurate.  Reviewed weight history in chart. If weight of 106.2 kg is accurate, pt has experienced a 32.1 kg weight loss over the last year. This is a 23.2% weight loss which is significant for timeframe.  RD will order oral nutrition supplements to aid pt in meeting kcal and protein needs.  Medications reviewed and include: SSI, heparin, IV abx  Labs reviewed: sodium 133, potassium 3.3, chloride 93, elevated LFTs CBG's: 91, 216  UOP: 1050 ml x 24 hours  NUTRITION - FOCUSED PHYSICAL EXAM:  Unable to complete at this time. RD working  remotely.  Diet Order:   Diet Order            Diet heart healthy/carb modified Room service appropriate? Yes; Fluid consistency: Thin  Diet effective now              EDUCATION NEEDS:   No education needs have been identified at this time  Skin:  Skin Assessment: Skin Integrity Issues: Skin Integrity Issues: Other: cellulitis to bilateral legs, MASD to groin  Last BM:  no documented BM  Height:   Ht Readings from Last 1 Encounters:  10/09/19 5\' 10"  (1.778 m)    Weight:   Wt Readings from Last 1 Encounters:  10/10/19 106.2 kg    Ideal Body Weight:  75.5 kg  BMI:  Body mass index is 33.59 kg/m.  Estimated Nutritional Needs:   Kcal:  2100-2300  Protein:  105-125 grams  Fluid:  >/= 2.0 L    Gaynell Face, MS, RD, LDN Inpatient Clinical Dietitian Pager: [phone] Weekend/After Hours: [phone]

## 2019-10-10 NOTE — Progress Notes (Signed)
CRITICAL VALUE ALERT  Critical Value:  K+ 2.4    Date & Time Notied:  10/10/19 0915   Provider Notified: Marthenia Rolling, MD   Orders Received/Actions taken: 40 mEQ PO K+

## 2019-10-10 NOTE — Progress Notes (Signed)
ANTICOAGULATION CONSULT NOTE - Follow Up Consult  Pharmacy Consult for Heparin Indication: atrial fibrillation  Allergies  Allergen Reactions  . Actos [Pioglitazone] Swelling  . Timolol Other (See Comments)    Slow heart rate but tolerates Cosopt (dorzolamide-timolol) Pt has a rx for Timolol and uses it for glaucoma   . Ceftriaxone Rash    Patient Measurements: Height: 5\' 10"  (177.8 cm) Weight: 234 lb 2.1 oz (106.2 kg) IBW/kg (Calculated) : 73 Heparin Dosing Weight:   Vital Signs: Temp: 98.4 F (36.9 C) (11/02 1934) Temp Source: Oral (11/02 1934) BP: 60/43 (11/02 2200) Pulse Rate: 101 (11/02 2200)  Labs: Recent Labs    10/09/19 1654 10/09/19 2320 10/10/19 0317 10/10/19 0732 10/10/19 1035 10/10/19 1501 10/10/19 2022  HGB 13.4  --   --   --  11.2*  --   --   HCT 41.5  --   --   --  35.1*  --   --   PLT 202  --   --   --  160  --   --   APTT  --  38*  --   --  108*  --  123*  HEPARINUNFRC  --  >2.20*  --   --  1.20*  --  1.32*  CREATININE 2.61* 2.35* 2.26* 2.06*  --  2.28*  --     Estimated Creatinine Clearance: 33.6 mL/min (A) (by C-G formula based on SCr of 2.28 mg/dL (H)).   Medications:  Infusions:  . heparin 950 Units/hr (10/10/19 2223)  . lactated ringers with kcl 150 mL/hr at 10/10/19 2116  . piperacillin-tazobactam (ZOSYN)  IV 3.375 g (10/10/19 2120)    Assessment: Patient with Heparin level and PTT above goal.  No heparin issues per RN.  Goal of Therapy:  Heparin level 0.3-0.7 units/ml aPTT 66-102 seconds Monitor platelets by anticoagulation protocol: Yes   Plan:  Decrease heparin to 950units/hr Recheck levels at 0800  Nani Skillern Crowford 10/10/2019,10:24 PM

## 2019-10-10 NOTE — Progress Notes (Signed)
Pt. ready to be placed on CPAP at this time, declined full face mask as only occasionally used at home, wanted to be able to sip water if needed, made aware to notify prior for assistance/safety.

## 2019-10-10 NOTE — Progress Notes (Signed)
RT holding off on CPAP until Covid test comes back

## 2019-10-11 LAB — GLUCOSE, CAPILLARY
Glucose-Capillary: 224 mg/dL — ABNORMAL HIGH (ref 70–99)
Glucose-Capillary: 106 mg/dL — ABNORMAL HIGH (ref 70–99)
Glucose-Capillary: 184 mg/dL — ABNORMAL HIGH (ref 70–99)
Glucose-Capillary: 214 mg/dL — ABNORMAL HIGH (ref 70–99)
Glucose-Capillary: 228 mg/dL — ABNORMAL HIGH (ref 70–99)
Glucose-Capillary: 40 mg/dL — CL (ref 70–99)
Glucose-Capillary: 79 mg/dL (ref 70–99)
Glucose-Capillary: 324 mg/dL — ABNORMAL HIGH (ref 70–99)
Glucose-Capillary: 306 mg/dL — ABNORMAL HIGH (ref 70–99)

## 2019-10-11 LAB — APTT
aPTT: 93 seconds — ABNORMAL HIGH (ref 24–36)
aPTT: 87 seconds — ABNORMAL HIGH (ref 24–36)

## 2019-10-11 LAB — CBC WITH DIFFERENTIAL/PLATELET
Abs Immature Granulocytes: 0.03 10*3/uL (ref 0.00–0.07)
Basophils Absolute: 0.1 10*3/uL (ref 0.0–0.1)
Basophils Relative: 1 %
Eosinophils Absolute: 0.1 10*3/uL (ref 0.0–0.5)
Eosinophils Relative: 2 %
HCT: 37.5 % — ABNORMAL LOW (ref 39.0–52.0)
Hemoglobin: 11.5 g/dL — ABNORMAL LOW (ref 13.0–17.0)
Immature Granulocytes: 0 %
Lymphocytes Relative: 23 %
Lymphs Abs: 2.1 10*3/uL (ref 0.7–4.0)
MCH: 31.2 pg (ref 26.0–34.0)
MCHC: 30.7 g/dL (ref 30.0–36.0)
MCV: 101.6 fL — ABNORMAL HIGH (ref 80.0–100.0)
Monocytes Absolute: 0.5 10*3/uL (ref 0.1–1.0)
Monocytes Relative: 6 %
Neutro Abs: 6 10*3/uL (ref 1.7–7.7)
Neutrophils Relative %: 68 %
Platelets: 147 10*3/uL — ABNORMAL LOW (ref 150–400)
RBC: 3.69 MIL/uL — ABNORMAL LOW (ref 4.22–5.81)
RDW: 16.1 % — ABNORMAL HIGH (ref 11.5–15.5)
WBC: 8.8 10*3/uL (ref 4.0–10.5)
nRBC: 0 % (ref 0.0–0.2)

## 2019-10-11 LAB — RENAL FUNCTION PANEL
Albumin: 2.5 g/dL — ABNORMAL LOW (ref 3.5–5.0)
Anion gap: 12 (ref 5–15)
BUN: 37 mg/dL — ABNORMAL HIGH (ref 8–23)
CO2: 26 mmol/L (ref 22–32)
Calcium: 8.1 mg/dL — ABNORMAL LOW (ref 8.9–10.3)
Chloride: 94 mmol/L — ABNORMAL LOW (ref 98–111)
Creatinine, Ser: 1.99 mg/dL — ABNORMAL HIGH (ref 0.61–1.24)
GFR calc Af Amer: 37 mL/min — ABNORMAL LOW (ref >60)
GFR calc non Af Amer: 32 mL/min — ABNORMAL LOW (ref >60)
Glucose, Bld: 248 mg/dL — ABNORMAL HIGH (ref 70–99)
Phosphorus: 2.4 mg/dL — ABNORMAL LOW (ref 2.5–4.6)
Potassium: 3.7 mmol/L (ref 3.5–5.1)
Sodium: 132 mmol/L — ABNORMAL LOW (ref 135–145)

## 2019-10-11 LAB — HEPARIN LEVEL (UNFRACTIONATED)
Heparin Unfractionated: 0.94 IU/mL — ABNORMAL HIGH (ref 0.30–0.70)
Heparin Unfractionated: 1.01 IU/mL — ABNORMAL HIGH (ref 0.30–0.70)

## 2019-10-11 LAB — MAGNESIUM: Magnesium: 1.7 mg/dL (ref 1.7–2.4)

## 2019-10-11 MED ORDER — ALBUTEROL SULFATE (2.5 MG/3ML) 0.083% IN NEBU
2.5000 mg | INHALATION_SOLUTION | RESPIRATORY_TRACT | Status: DC | PRN
  Administered 2019-10-14: 2.5 mg via RESPIRATORY_TRACT
  Filled 2019-10-11: qty 3

## 2019-10-11 MED ORDER — SALINE SPRAY 0.65 % NA SOLN
1.0000 | NASAL | Status: DC | PRN
  Administered 2019-10-11: 1 via NASAL
  Filled 2019-10-11: qty 44

## 2019-10-11 MED ORDER — GUAIFENESIN-DM 100-10 MG/5ML PO SYRP
5.0000 mL | ORAL_SOLUTION | ORAL | Status: DC | PRN
  Administered 2019-10-14: 5 mL via ORAL
  Filled 2019-10-11: qty 10

## 2019-10-11 MED ORDER — IPRATROPIUM-ALBUTEROL 0.5-2.5 (3) MG/3ML IN SOLN
3.0000 mL | Freq: Two times a day (BID) | RESPIRATORY_TRACT | Status: DC
  Administered 2019-10-11 – 2019-10-13 (×5): 3 mL via RESPIRATORY_TRACT
  Filled 2019-10-11 (×5): qty 3

## 2019-10-11 MED ORDER — FLUTICASONE PROPIONATE 50 MCG/ACT NA SUSP
2.0000 | Freq: Every day | NASAL | Status: DC
  Administered 2019-10-11 – 2019-10-15 (×5): 2 via NASAL
  Filled 2019-10-11: qty 16

## 2019-10-11 MED ORDER — LORATADINE 10 MG PO TABS
10.0000 mg | ORAL_TABLET | Freq: Every day | ORAL | Status: DC
  Administered 2019-10-11 – 2019-10-15 (×5): 10 mg via ORAL
  Filled 2019-10-11 (×5): qty 1

## 2019-10-11 MED ORDER — PHENOL 1.4 % MT LIQD
1.0000 | OROMUCOSAL | Status: DC | PRN
  Filled 2019-10-11 (×2): qty 177

## 2019-10-11 MED ORDER — SODIUM CHLORIDE 0.9 % IV SOLN
INTRAVENOUS | Status: DC
  Administered 2019-10-11 – 2019-10-13 (×4): via INTRAVENOUS

## 2019-10-11 NOTE — Progress Notes (Signed)
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   Patient was under hospice care at home.  Patient was admitted with altered mental status and poorly controlled blood sugar (hyper/hypoglycemia).  On presentation, patient was hypotensive as well.  Patient has been volume resuscitated.  Current blood pressure is 111/87 mmHg.  UA suggestive of likely UTI.  Low potassium was noted on presentation.  Last documented potassium was 2.4.  Worsening kidney function on presentation, but slowly improving.  Last renal function revealed serum creatinine is trending towards baseline.  10/10/2019: Altered mental status has improved significantly.  Patient denies fever or chills.  Patient reports nonproductive cough.  Chest x-ray has not shown any new changes.  Cultures are still pending.  Leukocytosis as results, WBC is down from 12 to 8.5.  Patient is currently on IV Zosyn.  Patient reports that the wife and family are no longer able to look after him at home.  Will consult PT, OT, case management versus social work team.  We will also consult palliative care team to define goal of care.  Further management will depend on hospital course.  11/07/2019: Urine culture has not grown any organisms.  Patient has chronic wound care problems involving lower legs and feet, and the feet may be infected.  Will consult wound care team.  Will likely need cultures from any open wound.  Episodes of hypotension noted yesterday, but improving with aggressive hydration.  Hypokalemia is improving as well.  Altered mental status has resolved significantly.  Patient  reports what may appear to be postnasal drip symptoms.  Palliative care team has been consulted to define goal of care.  Further management will depend on hospital course.  Assessment & Plan:   Active Problems:   Hyperosmolar hyperglycemic state (HHS) ([place])  Sepsis: -Source unclear.  -Urine culture has not grown any organisms. -Cultures have not grown any organisms. -Wound care consulted to assess patient's wound involving the leg and feet.  Will send further cultures if deemed necessary.  -Continue IV Zosyn. -Low threshold to add IV vancomycin.  Also have a low threshold to consult infectious disease team. -Hypotension is slowly resolving.   -Follow cultures. -Continue to trend lactic acid level. -Continue volume resuscitation -Consult palliative care to define goal of care. -Further management will depend on hospital course.  Acute encephalopathy: -Likely secondary to combined toxic and metabolic encephalopathy. -Manage sepsis. -Corotto volume depletion. -Monitor and correct abnormal electrolytes. -Acute encephalopathy has resolved significantly.  Hypokalemia: -Continue to monitor and replete.   -Last potassium level was 3.7.  AKI on chronic kidney disease stage III: -Likely prerenal.   -Serum creatinine is down to 2.06.  Baseline serum creatinine ranges from 1.6 to 2.  -Serum creatinine today is 1.99  Volume depletion: Continue volume resuscitation. -Further management depend on hospital course.  Paroxysmal Afib -Eliquis is on hold.   -Currently on heparin drip.   -We will transition back to Eliquis fairly soon (if renal function continues to improve, no procedures planned).   Diabetes mellitus: -Uncontrolled. -Blood sugars labile (goes from low to high) -Continue sliding scale insulin coverage for now.  Possible postnasal  drip syndrome: -Start Flonase -Start loratadine -Speech evaluation.  Chronic wound involving the feet and lower legs: -Consult wound  care team. -Cannot rule out this as possible source of sepsis.  DVT prophylaxis: Heparin drip Code Status: DO NOT RESUSCITATE Family Communication:  Disposition Plan: This will depend on hospital course.  Patient was under hospice care.  Will consult palliative care team.  Need to define goal of care.  Patient tells me that the wife and family are no longer able to look after him.  Consult PT OT.  Consult case management and possible social work team.   Consultants:   Palliative care  Procedures:   None  Antimicrobials:   IV Zosyn   Subjective: Altered mental status has resolved. No fever or chills.  Objective: Vitals:   10/11/19 0500 10/11/19 0800 10/11/19 0822 10/11/19 0900  BP: (!) 90/45 (!) 93/53  (!) 151/138  Pulse:  (!) 106  (!) 107  Resp:  13  11  Temp:  98.4 F (36.9 C)    TempSrc:  Axillary    SpO2:  99% 99% 90%  Weight:      Height:        Intake/Output Summary (Last 24 hours) at 10/11/2019 0955 Last data filed at 10/11/2019 0900 Gross per 24 hour  Intake 1980.31 ml  Output 2000 ml  Net -19.69 ml   Filed Weights   10/09/19 2200 10/10/19 0409 10/11/19 0350  Weight: 106.2 kg 106.2 kg 110.5 kg    Examination:  General exam: Appears calm and comfortable.  Chronically ill looking. Respiratory system: Clear to auscultation. Cardiovascular system: S1 & S2 heard. Gastrointestinal system: Abdomen is nondistended, soft and nontender.  Central nervous system: Awake and alert.  Patient moves all extremities.   Extremities: Lower leg is wrapped.  Chronically looking wound with possibly infected area involving the feet and digits/interdigital area.   Data Reviewed: I have personally reviewed following labs and imaging studies  CBC: Recent Labs  Lab 10/09/19 1654 10/10/19 1035 10/11/19 0748  WBC 12.0* 8.5 8.8  NEUTROABS 9.3*  --  6.0  HGB 13.4 11.2* 11.5*  HCT 41.5 35.1* 37.5*  MCV 95.6 98.9 101.6*  PLT 202 160 [id number]*   Basic Metabolic Panel: Recent  Labs  Lab 10/09/19 2320 10/10/19 0317 10/10/19 0732 10/10/19 1501 10/11/19 0748  NA 133* 134* 136 133* 132*  K 2.3* 2.3* 2.4* 3.3* 3.7  CL 89* 91* 94* 93* 94*  CO2 30 30 28 28 26   GLUCOSE 221* 73 82 289* 248*  BUN 45* 43* 43* 43* 37*  CREATININE 2.35* 2.26* 2.06* 2.28* 1.99*  CALCIUM 8.3* 8.4* 8.4* 7.9* 8.1*  MG 2.0  --   --   --  1.7  PHOS 2.9  --   --   --  2.4*   GFR: Estimated Creatinine Clearance: 39.3 mL/min (A) (by C-G formula based on SCr of 1.99 mg/dL (H)). Liver Function Tests: Recent Labs  Lab 10/07/19 0800 10/09/19 2320 10/11/19 0748  AST 61* 58*  --   ALT 63* 55*  --   ALKPHOS 266* 180*  --   BILITOT 0.5 0.8  --   PROT 6.2 6.8  --   ALBUMIN 3.1* 2.6* 2.5*   No results for input(s): LIPASE, AMYLASE in the last 168 hours. No results for input(s): AMMONIA in the last 168 hours. Coagulation Profile: No results for input(s): INR, PROTIME in the last 168 hours. Cardiac Enzymes: No results for input(s): CKTOTAL, CKMB, CKMBINDEX, TROPONINI in the last  168 hours. BNP (last 3 results) No results for input(s): PROBNP in the last 8760 hours. HbA1C: Recent Labs    10/09/19 2320  HGBA1C 9.9*   CBG: Recent Labs  Lab 10/10/19 0806 10/10/19 1212 10/10/19 1710 10/10/19 2108 10/11/19 0756  GLUCAP 91 216* 241* 280* 224*   Lipid Profile: No results for input(s): CHOL, HDL, LDLCALC, TRIG, CHOLHDL, LDLDIRECT in the last 72 hours. Thyroid Function Tests: No results for input(s): TSH, T4TOTAL, FREET4, T3FREE, THYROIDAB in the last 72 hours. Anemia Panel: No results for input(s): VITAMINB12, FOLATE, FERRITIN, TIBC, IRON, RETICCTPCT in the last 72 hours. Urine analysis:    Component Value Date/Time   COLORURINE YELLOW 10/09/2019 1835   APPEARANCEUR HAZY (A) 10/09/2019 1835   LABSPEC 1.008 10/09/2019 1835   PHURINE 6.0 10/09/2019 1835   GLUCOSEU 50 (A) 10/09/2019 1835   GLUCOSEU 100 09/30/2013 1535   HGBUR LARGE (A) 10/09/2019 [place]  10/09/2019 [place] 10/09/2019 1835   PROTEINUR 30 (A) 10/09/2019 1835   UROBILINOGEN 1.0 10/01/2013 1003   NITRITE NEGATIVE 10/09/2019 1835   LEUKOCYTESUR LARGE (A) 10/09/2019 1835   Sepsis Labs: @LABRCNTIP (procalcitonin:4,lacticidven:4)  ) Recent Results (from the past 240 hour(s))  Culture, blood (routine x 2)     Status: None (Preliminary result)   Collection Time: 10/09/19  4:55 PM   Specimen: BLOOD  Result Value Ref Range Status   Specimen Description   Final    BLOOD RIGHT WRIST Performed at [facility] [address], [place], [place] [address]    Special Requests   Final    BOTTLES DRAWN AEROBIC AND ANAEROBIC Blood Culture adequate volume Performed at [facility] [address] [place], [place] [address]    Culture   Final    NO GROWTH 1 DAY Performed at [facility], [place] [address], [place], [place] [address]    Report Status PENDING  Incomplete  Culture, blood (routine x 2)     Status: None (Preliminary result)   Collection Time: 10/09/19  5:00 PM   Specimen: BLOOD RIGHT FOREARM  Result Value Ref Range Status   Specimen Description   Final    BLOOD RIGHT FOREARM Performed at [facility] [address] [place], [place] [address]    Special Requests   Final    BOTTLES DRAWN AEROBIC AND ANAEROBIC Blood Culture adequate volume Performed at [facility] [address] [place], [place] [address]    Culture   Final    NO GROWTH 1 DAY Performed at [facility], [place] [address], [place], [place] [address]    Report Status PENDING  Incomplete  SARS CORONAVIRUS 2 (TAT 6-24 HRS) Nasopharyngeal Nasopharyngeal Swab     Status: None   Collection Time: 10/09/19  5:34 PM   Specimen: Nasopharyngeal Swab  Result Value Ref Range Status   SARS Coronavirus 2 NEGATIVE NEGATIVE Final    Comment: (NOTE) SARS-CoV-2 target nucleic acids are NOT DETECTED. The  SARS-CoV-2 RNA is generally detectable in upper and lower respiratory specimens during the acute phase of infection. Negative results do not preclude SARS-CoV-2 infection, do not rule out co-infections with other pathogens, and should not be used as the sole basis for treatment or other patient management decisions. Negative results must be combined with clinical observations, patient history, and epidemiological information. The expected result is Negative. Fact Sheet for Patients: SugarRoll.be Fact Sheet for Healthcare Providers: https://www.woods-mathews.com/ This test  is not yet approved or cleared by the [place] and  has been authorized for detection and/or diagnosis of SARS-CoV-2 by FDA under an Emergency Use Authorization (EUA). This EUA will remain  in effect (meaning this test can be used) for the duration of the COVID-19 declaration under Section 56 4(b)(1) of the Act, 21 U.S.C. section 360bbb-3(b)(1), unless the authorization is terminated or revoked sooner. Performed at [facility], [place] [address], [place], [place] [address]   Urine culture     Status: None   Collection Time: 10/09/19  6:35 PM   Specimen: Urine, Random  Result Value Ref Range Status   Specimen Description   Final    URINE, RANDOM Performed at [facility] [address], [place], [place] [address]    Special Requests   Final    NONE Performed at [facility], [place] [address], [place], [place] [address]    Culture   Final    NO GROWTH Performed at [facility], [place] [address], [place], [place] [address]    Report Status 10/10/2019 FINAL  Final  MRSA PCR Screening     Status: None   Collection Time: 10/09/19 10:00 PM   Specimen: Nasal Mucosa; Nasopharyngeal  Result Value Ref Range Status   MRSA by PCR NEGATIVE NEGATIVE Final    Comment:        The GeneXpert MRSA Assay (FDA approved for NASAL  specimens only), is one component of a comprehensive MRSA colonization surveillance program. It is not intended to diagnose MRSA infection nor to guide or monitor treatment for MRSA infections. Performed at [facility], [place] [address] [place], [place] [address]          Radiology Studies: Dg Chest Port 1 View  Result Date: 10/09/2019 CLINICAL DATA:  Altered mental status. EXAM: PORTABLE CHEST 1 VIEW COMPARISON:  06/02/2018 FINDINGS: Stable borderline cardiomegaly. Dual lead transvenous pacemaker remains in appropriate position. Aortic atherosclerosis. Both lungs are clear. IMPRESSION: Stable borderline cardiomegaly. No active lung disease. Electronically Signed   By: Marlaine Hind M.D.   On: 10/09/2019 17:21        Scheduled Meds:  amiodarone  200 mg Oral Daily   brimonidine  1 drop Left Eye TID   Chlorhexidine Gluconate Cloth  6 each Topical Daily   dorzolamide  1 drop Both Eyes BID   hydrocerin   Topical Daily   influenza vaccine adjuvanted  0.5 mL Intramuscular Tomorrow-1000   insulin aspart  0-5 Units Subcutaneous QHS   insulin aspart  0-9 Units Subcutaneous TID WC   ipratropium-albuterol  3 mL Nebulization BID   latanoprost  1 drop Left Eye QHS   levothyroxine  75 mcg Oral Q0600   mouth rinse  15 mL Mouth Rinse BID   rosuvastatin  10 mg Oral q1800   sodium chloride flush  3 mL Intravenous Q12H   timolol  1 drop Both Eyes BID   Continuous Infusions:  sodium chloride 125 mL/hr at 10/11/19 0900   heparin 950 Units/hr (10/11/19 0802)   piperacillin-tazobactam (ZOSYN)  IV Stopped (10/11/19 0902)     LOS: 2 days    Time spent: 35 Minutes.    Dana Allan, MD  [facility] Pager #: [phone] 7PM-7AM contact night coverage as above

## 2019-10-11 NOTE — Progress Notes (Signed)
ANTICOAGULATION CONSULT NOTE - Follow Up Consult  Pharmacy Consult for heparin Indication: hx atrial fibrillation  Allergies  Allergen Reactions  . Actos [Pioglitazone] Swelling  . Timolol Other (See Comments)    Slow heart rate but tolerates Cosopt (dorzolamide-timolol) Pt has a rx for Timolol and uses it for glaucoma   . Ceftriaxone Rash    Patient Measurements: Height: 5\' 10"  (177.8 cm) Weight: 243 lb 9.7 oz (110.5 kg) IBW/kg (Calculated) : 73 Heparin Dosing Weight: 97 kg  Vital Signs: Temp: 98.2 F (36.8 C) (11/03 1200) Temp Source: Oral (11/03 1200) BP: 90/47 (11/03 1350) Pulse Rate: 106 (11/03 1350)  Labs: Recent Labs    10/09/19 1654  10/10/19 0732 10/10/19 1035 10/10/19 1501 10/10/19 2022 10/11/19 0748 10/11/19 1554  HGB 13.4  --   --  11.2*  --   --  11.5*  --   HCT 41.5  --   --  35.1*  --   --  37.5*  --   PLT 202  --   --  160  --   --  147*  --   APTT  --    < >  --  108*  --  123* 93* 87*  HEPARINUNFRC  --    < >  --  1.20*  --  1.32* 0.94* 1.01*  CREATININE 2.61*   < > 2.06*  --  2.28*  --  1.99*  --    < > = values in this interval not displayed.    Estimated Creatinine Clearance: 39.3 mL/min (A) (by C-G formula based on SCr of 1.99 mg/dL (H)).   Medications:  - on Apixaban 5mg  BID PTA (Last dose taken on 11/1 at 11am)  Assessment: Patient's a 78 y.o M with hx a-fib on Apixaban PTA, presented to the ED on 11/1 with AMS. She was started on abx for suspected sepsis secondary to UTI and anticoagulation transitioned to heparin drip on admission.  Today, 10/11/2019: - afternoon aPTT remains therapeutic at 87 seconds - Heparin level remains elevated at 1.01 units/mL - Note that recent Apixaban can falsely elevate heparin levels. Therefore, will titrate/adjust heparin infusion based on aPTT for now until heparin level correlates with aPTT.  - Hgb stable at 11.5; Pltc trending down --> monitor closely for now - No bleeding or infusion issues noted  per nursing  Goal of Therapy:  Heparin level 0.3-0.7 units/ml aPTT 66-102 seconds Monitor platelets by anticoagulation protocol: Yes   Plan:  - Continue heparin infusion at 950 units/hr - Daily CBC, heparin level, aPTT - Monitor closely for s/sx of bleeding    Lindell Spar, PharmD, BCPS Clinical Pharmacist  10/11/2019,5:00 PM

## 2019-10-11 NOTE — Progress Notes (Addendum)
ANTICOAGULATION CONSULT NOTE - Follow Up Consult  Pharmacy Consult for heparin Indication: hx atrial fibrillation  Allergies  Allergen Reactions  . Actos [Pioglitazone] Swelling  . Timolol Other (See Comments)    Slow heart rate but tolerates Cosopt (dorzolamide-timolol) Pt has a rx for Timolol and uses it for glaucoma   . Ceftriaxone Rash    Patient Measurements: Height: 5\' 10"  (177.8 cm) Weight: 243 lb 9.7 oz (110.5 kg) IBW/kg (Calculated) : 73 Heparin Dosing Weight: 103 kg  Vital Signs: Temp: 98.4 F (36.9 C) (11/03 0800) Temp Source: Axillary (11/03 0800) BP: 93/53 (11/03 0800) Pulse Rate: 106 (11/03 0800)  Labs: Recent Labs    10/09/19 1654  10/09/19 2320  10/10/19 0732 10/10/19 1035 10/10/19 1501 10/10/19 2022 10/11/19 0748  HGB 13.4  --   --   --   --  11.2*  --   --  11.5*  HCT 41.5  --   --   --   --  35.1*  --   --  37.5*  PLT 202  --   --   --   --  160  --   --  147*  APTT  --    < > 38*  --   --  108*  --  123* 93*  HEPARINUNFRC  --   --  >2.20*  --   --  1.20*  --  1.32*  --   CREATININE 2.61*  --  2.35*   < > 2.06*  --  2.28*  --  1.99*   < > = values in this interval not displayed.    Estimated Creatinine Clearance: 39.3 mL/min (A) (by C-G formula based on SCr of 1.99 mg/dL (H)).   Medications:  - on  Eliquis 5 mg bid PTA (Last dose taken on 11/1 at 11a)  Assessment: Patient's a 77 y.o M with hx afib on Eliquis PTA, presented to the ED on 11/1 with AMS.  She was started on abx for suspected sepsis secondary to UTI and anticoag. transitioned to heparin drip on admission.  Today, 10/11/2019: - aPTT is therapeutic at 93 secs; Heparin level is elevated but trending down to 0.94 - Note that recent Eliquis can falsely elevate heparin levels. Therefore, will titrate/adjust heparin infusion based on aPTT for now until heparin level correlates with aPTT - hgb is somewhat stable today; plts trending down with 147--> monitor closely for now - no bleeding  documented  Goal of Therapy:  Heparin level 0.3-0.7 units/ml aPTT 66-102 seconds Monitor platelets by anticoagulation protocol: Yes   Plan:  - continue heparin drip at 950 units/hr - check 8 hr aPTT and heparin level at 4 PM to ensure level is still therapeutic before changing to daily monitoring - monitor for s/s bleeding  Axzel Rockhill P 10/11/2019,8:43 AM

## 2019-10-11 NOTE — Progress Notes (Signed)
Multiple attempts overnight to capture an accurate automatic blood pressure s/p 500 cc LR bolus and 25g albumin. Different cuff sizes tried, rotating arms. Manual blood pressure of 90/45, patient asymptomatic, radial pulses palpable. Will continue to monitor.

## 2019-10-11 NOTE — Progress Notes (Signed)
CPAP remains in room for PRN use, pt. unable to tolerate 11/2 h/s, RT to monitor.

## 2019-10-11 NOTE — Progress Notes (Signed)
OT Cancellation Note  Patient Details Name: [name] MRN: [id number] DOB: 1941-03-23   Cancelled Treatment:    Reason Eval/Treat Not Completed: Other (comment). Pt is working with SLP.  Will check back tomorrow.  Adelin Ventrella 10/11/2019, 3:51 PM  Lesle Chris, OTR/L Acute Rehabilitation Services [phone] [facility] pager [phone] office 10/11/2019

## 2019-10-11 NOTE — Evaluation (Signed)
Clinical/Bedside Swallow Evaluation Patient Details  Name: [name] MRN: [id number] Date of Birth: 19-Jun-1941  Today's Date: 10/11/2019 Time: SLP Start Time (ACUTE ONLY): 40 SLP Stop Time (ACUTE ONLY): 1651 SLP Time Calculation (min) (ACUTE ONLY): 26 min  Past Medical History:  Past Medical History:  Diagnosis Date  . Anticoagulated on Coumadin 05/05/2018  . Bladder cancer ([place]) 2014  . COPD (chronic obstructive pulmonary disease) (HCC)    CPAP  . Depression   . Diabetes mellitus    TypeII  . Diastolic dysfunction with chronic heart failure ([place])   . GERD (gastroesophageal reflux disease)   . H/O Legionnaire's disease 2003  . Hyperlipemia   . Hypothyroidism   . Morbid obesity ([place])   . OSA on CPAP   . Osteoarthritis    fingers  . Pancreatitis   . Peripheral neuropathy   . Persistent atrial fibrillation ([place])   . Renal insufficiency   . Sick sinus syndrome ([place]) 06/2010   s/p PPM by [employer]  . Venous insufficiency    Past Surgical History:  Past Surgical History:  Procedure Laterality Date  . BELPHAROPTOSIS REPAIR Right    Glaucoma  . CARDIAC CATHETERIZATION     11-02-13  . CARDIOVERSION N/A 05/16/2014   Procedure: CARDIOVERSION;  Surgeon: Josue Hector, MD;  Location: [facility] ENDOSCOPY;  Service: Cardiovascular;  Laterality: N/A;  . CARDIOVERSION N/A 11/08/2014   Procedure: CARDIOVERSION;  Surgeon: Fay Records, MD;  Location: [place];  Service: Cardiovascular;  Laterality: N/A;  . CARDIOVERSION N/A 06/22/2015   Procedure: CARDIOVERSION;  Surgeon: Jerline Pain, MD;  Location: [place];  Service: Cardiovascular;  Laterality: N/A;  . CHOLECYSTECTOMY  2012  . CYSTOSCOPY W/ URETERAL STENT PLACEMENT Right 10/26/2013   Procedure: CYSTOSCOPY WITH RETROGRADE PYELOGRAM/URETERAL STENT PLACEMENT;  Surgeon: Alexis Frock, MD;  [facility];  Service: Urology;  Laterality: Right;  . CYSTOSCOPY WITH URETEROSCOPY AND STENT PLACEMENT Right 01/11/2014   Procedure:  CYSTOSCOPY WITH URETEROSCOPY ,RIGHT RETROGRADE AND STENT CHANGE AND LASER OF URETERAL TUMOR;  Surgeon: Alexis Frock, MD;  [facility];  Service: Urology;  Laterality: Right;  . CYSTOSCOPY/RETROGRADE/URETEROSCOPY Right 08/23/2014   Procedure: CYSTOSCOPY/RETROGRADE/ DIAGNOSTIC URETEROSCOPY/RIGHT RENAL STONE EXTRACTION;  Surgeon: Alexis Frock, MD;  [facility];  Service: Urology;  Laterality: Right;  . EMBOLECTOMY Left 11/02/2013   Procedure: LEFT FEMORAL EMBOLECTOMY, LEFT FEMORAL ARTERY ENDARTERECTOMY WITH DACRON PATCH ANGIOPLASTY.;  Surgeon: Mal Misty, MD;  Location: [place];  Service: Vascular;  Laterality: Left;  . EYE SURGERY Left    cataract  . GROIN DEBRIDEMENT Right 05/08/2015   Procedure: REMOVAL OF RIGHT GROIN MASS;  Surgeon: Angelia Mould, MD;  Location: [place];  Service: Vascular;  Laterality: Right;  . HOLMIUM LASER APPLICATION Right [id number]   Procedure: HOLMIUM LASER APPLICATION;  Surgeon: Alexis Frock, MD;  [facility];  Service: Urology;  Laterality: Right;  . INSERT / REPLACE / REMOVE PACEMAKER  2011  . NASAL SEPTUM SURGERY  1967  . PACEMAKER PLACEMENT  06/2010   [facility] Accent RF DR, Model [id number] ( Serial number [id number])  . TEE WITHOUT CARDIOVERSION N/A 05/16/2014   Procedure: TRANSESOPHAGEAL ECHOCARDIOGRAM (TEE);  Surgeon: Josue Hector, MD;  Location[facility] ENDOSCOPY;  Service: Cardiovascular;  Laterality: N/A;  . thromboembolectomy and four compartment fasciotomy Right 2009   leg  . TRANSURETHRAL RESECTION OF BLADDER TUMOR WITH GYRUS (TURBT-GYRUS) N/A 10/26/2013   Procedure: TRANSURETHRAL RESECTION OF BLADDER TUMOR WITH GYRUS (TURBT-GYRUS);  Surgeon: Alexis Frock,  MD;  [facility];  Service: Urology;  Laterality: N/A;  . TRANSURETHRAL RESECTION OF BLADDER TUMOR WITH GYRUS (TURBT-GYRUS) N/A 01/11/2014   Procedure: TRANSURETHRAL RESECTION OF BLADDER TUMOR WITH GYRUS (TURBT-GYRUS);  Surgeon: Alexis Frock, MD;  [facility];  Service:  Urology;  Laterality: N/A;  . WISDOM TOOTH EXTRACTION     HPI: pt admitted with weakness, has a terminal diagnosis of CHF= Pt has h/o GERD, peripheral neuropathy, COPD.  10/09/2019 CXR negative for acute change = showed stable borderline cardiomegaly.         Assessment / Plan / Recommendation Clinical Impression  Pt with complaint of secretion retention - worse than previously experienced.  He states this is causing dysphagia for him which contributes to his lack of desire to eat. But also admits to poor appetite.  SLP attempted to problem solve with pt inquiring re: pharmaceutical options -including Mucinex *which he is currently prescribed but had not taken yet today.  Pt advised he was taking Mucinex 600 mg BID per home hospice which was helpful.  Advised he consume plenty of liquids = consider warm if help to loosen secretions for adequate expectoration.  Pt does demonstrate strong cough response and was observed to expectorate thin secretions during po trials.  Recommended he consider avoiding milk based products if they worsen his secretions.  SLP provided him with a Boost Breeze as he reported the Ensure was "too thick".   Pt tolerated small single boluses of Boost Breeze without report of difficulty nor overt coughing.  He was noted to belch a few times during session but denies refluxing - stating it was "just air".  Suboptimal position noted as pt slid down in the bed and is unable to maintain neutral head position due to weakness.  He may benefit from consideration for home humidification in his bedroom given his mouthbreathing which may contribute to secretion retention.  SLP advised pt to strategies to help mitigate his secretion retention and he reported understanding to information.  As pt is followed by hospice, recommend he continue his diet as tolerated assuring adequate liquid intake.  No SLP follow up as pt educated to recommendations.  Thanks for this consult. SLP Visit Diagnosis:  Dysphagia, unspecified (R13.10)    Aspiration Risk       Diet Recommendation Other (Comment)(diet as tolerated)   Liquid Administration via: Straw Medication Administration: Whole meds with liquid Supervision: Patient able to self feed Compensations: Slow rate;Small sips/bites Postural Changes: Remain upright for at least 30 minutes after po intake;Seated upright at 90 degrees    Other  Recommendations   n/a  Follow up Recommendations   n/a     Frequency and Duration   n/a         Prognosis   n/a     Swallow Study   General  Pt admitted with weakness, has a terminal diagnosis of CHF= Pt has h/o GERD, peripheral neuropathy, COPD.  10/09/2019 CXR negative for acute change = showed stable borderline cardiomegaly.     Oral/Motor/Sensory Function Overall Oral Motor/Sensory Function: Severe impairment   Ice Chips Ice chips: Not tested   Thin Liquid Thin Liquid: Impaired Presentation: Straw Pharyngeal  Phase Impairments: Multiple swallows;Cough - Delayed    Nectar Thick Nectar Thick Liquid: Not tested   Honey Thick Honey Thick Liquid: Not tested   Puree Puree: Not tested Other Comments: pt declined to consume puree   Solid     Solid: Not tested Other Comments: pt declined to consume  solids      Macario Golds 10/11/2019,5:18 PM  Luanna Salk, [place] [name] SLP Acute Rehab Services Pager [phone] Office [phone]

## 2019-10-11 NOTE — Progress Notes (Signed)
[facility] GIP RN Note @ 130pm  [name] was transported to the ED for evaluation of altered level of consciousness on 10/09/19.  He is under hospice services with a terminal diagnosis of congestive heart failure per Dr. Karie Georges. Family elected to call EMS during visit with hospice RN.  This is a related admission. He is admitted with altered level of consciousness, hypotension, rule out sepsis.  Pt alert and oriented, no current unmanaged symptoms. Spoke to his wife, she is overwhelmed and states she can no longer provide care for him in the home. She already arranged for his DME to be picked up.  She wants him placed at a facility where she can visit him.  TOC manager at hospital updated.  V/S:  98.2 oral, 140/97, HR 110, RR 15, SPO2 99% 2 lpm  I&O: 1535/200 Abnormal lab work: Na 132, glucose 248, BUN 37, Cr 1.99, albumin 2.5, gfr 32, rbc 3.69, hgb 11.5, HCT 37.5 Diagnostics:  Chest x-ray on 11/1, clear IVs/PRNs: Zosyn 3.375 mg IV TID, NS @ 125 ml/hr, albumin 25g IV x 2, heparin 950 u/hr IV, dilaudid 0.5 mg IV x 1  Problem list: Sepsis-unclear origin, Zosyn as above; BP has stabilized  Altered level of consciousness- back to baseline  Hypotension- resolved, normotensive  D/C planning- ongoing; cannot return home, his wife can no longer provide care for him; hospital staff and hospice staff aware [place]- ongoing, pt is a DNR, but currently requesting full scope of treatment IDT- discussed and updated Family- updated  Please call with any hospice related questions/concerns, Venia Carbon RN, BSN, [place] [facility] Liaison (in [place]) [phone]

## 2019-10-11 NOTE — Progress Notes (Signed)
MD Ogbata made aware of soft blood pressures for this patient, 70/43 verified manually. MD states this is probably baseline for patient when sleeping deeply. Patient has palpable radial pulse and mentation appropriate. Ordered change of maintenance fluids to NS at 125. Will continue to monitor.

## 2019-10-11 NOTE — Progress Notes (Signed)
Inpatient Diabetes Program Recommendations  AACE/ADA: New Consensus Statement on Inpatient Glycemic Control (2015)  Target Ranges:  Prepandial:   less than 140 mg/dL      Peak postprandial:   less than 180 mg/dL (1-2 hours)      Critically ill patients:  140 - 180 mg/dL   Results for [name], [name] (MRN [id number]) as of 10/11/2019 09:36  Ref. Range 10/10/2019 08:06 10/10/2019 12:12 10/10/2019 17:10 10/10/2019 21:08  Glucose-Capillary Latest Ref Range: 70 - 99 mg/dL 91 216 (H)  3 units NOVOLOG  241 (H)  3 units NOVOLOG  280 (H)  3 units NOVOLOG    Results for [name], [name] (MRN [id number]) as of 10/11/2019 09:36  Ref. Range 10/11/2019 07:56  Glucose-Capillary Latest Ref Range: 70 - 99 mg/dL 224 (H)  3 units NOVOLOG     Admit with: AMS/ HHS/ Possible Sepsis secondary to UTI/ Severe Hypokalemia  History: DM, CKD, Bladder cancer, CHF  Home DM Meds: Regular Insulin (U100): 300 units AM, 210 units Lunch, 210 units Dinner, 190 units Bedtime  Current Orders: Novolog Sensitive Correction Scale/ SSI (0-9 units) TID AC + HS     Hospice services at home.  Used to see Dr. [name] with [name] for diabetes care.  Hasn't seen ENDO since October 2019.   Wife reports poor PO intake at home over the last few weeks with poor glucose control.  Spoke w/ pt's wife yesterday by phone.  Pt's wife told me that pt has been taking approximately 600 units total insulin (Novolin R (Regular) U100 insulin) in divided doses for the last few weeks.  Per wife, pt checks his CBG and gives himself insulin based on the CBG reading and food intake.  Per wife, the Hospice nurses bring him his insulin.  Not sure if PCP or Hospice MD is managing pt's insulin doses at home.  Per wife, pt has been having severe low CBGs at home on occasion.  EMS has been called recently for HYPO at home.    Pt on very unusual insulin regimen.  Extremely large doses of Regular Insulin without basal insulin.  May fare better with  Regular Insulin SSI + basal Insulin.      MD- Please consider starting weight based dose of Lantus today.  CBG 224 this AM.  Recommend the following:  1. Start Lantus 10 units Daily (0.1 units/kg)  2. Start low dose Novolog Meal Coverage:  Novolog 3 units TID with meals  (Please add the following Hold Parameters: Hold if pt eats <50% of meal, Hold if pt NPO)     --Will follow patient during hospitalization--  Wyn Quaker RN, MSN, CDE Diabetes Coordinator Inpatient Glycemic Control Team Team Pager: [phone] (8a-5p)

## 2019-10-11 NOTE — Progress Notes (Signed)
MD Ogbata made aware of recurring low blood pressure with patient sleeping. 78/60 confirmed manually in both arms. No new orders at this time, will continue to monitor.

## 2019-10-11 NOTE — Progress Notes (Signed)
RN called to notify pt. unable to tolerate nasal CPAP mask, placed pt. Back on n/c at previous setting.

## 2019-10-11 NOTE — TOC Initial Note (Addendum)
Transition of Care [facility]) - Initial/Assessment Note    Patient Details  Name: [name] MRN: [id number] Date of Birth: [birth year]  Transition of Care [facility]) CM/SW Contact:    Lia Hopping, [place] Phone Number: 10/11/2019, 2:55 PM  Clinical Narrative:      Patient is currently under [place] with a terminal diagnosis of congestive heart failure. Patient admitted for altered level of consciousness, hypotension, rule out sepsis.   CSW met with patient at bedside. Patient sleeping upon arrival. CSW introduced role and reason for visit. Patient gave CSW permission to talk with his spouse. Patient provided a contact number. Spouse explain she can no longer provide care for the patient at home, she recently suffered a femur fracture and how difficult it has been to care for the patient at home. She reports, " the bottom dropped out on Sunday, he was incoherent and confused." She reports prior to Sunday the patient was ambulatory with rolling walker, although "very shaky" and unsteady on his feet at times. The patient bathed and toileted self. She reports through hospice services a nurse came out to the home two times a week to assist with vitals and and leg wound dressing changes. Patient spouse is concern about the patient "severe decline." Spouse express she is hoping for nursing home placement.   CSW explain the SNF process as it pertains to long term care(is not covered by insurance) vs. short rehab (is covered by insurance), hospice care, and room and board. Spouse understands [employer] will not pay for long term care. She is agreeable to pay privately for SNF long term care. CSW follow up SNF bed choices. FL2 to be completed.  TOC staff will continue to follow this patient and assist with discharge needs.   Expected Discharge Plan: Skilled Nursing Facility Barriers to Discharge: Continued Medical Work up   Patient Goals and CMS Choice Patient states their goals for  this hospitalization and ongoing recovery are:: Per spouse: " I cannot care for him at home any longer. He has to go a nursing home." CMS Medicare.gov Compare Post Acute Care list provided to:: Other (Comment Required)(Spouse) Choice offered to / list presented to : Spouse  Expected Discharge Plan and Services Expected Discharge Plan: [place] In-house Referral: Clinical Social Work   Post Acute Care Choice: Nursing Home Living arrangements for the past 2 months: [place]                                     Prior Living Arrangements/Services Living arrangements for the past 2 months: Single Family Home Lives with:: Spouse Patient language and need for interpreter reviewed:: No Do you feel safe going back to the place where you live?: No   Patient spouse unable to care for the patient at hoem.  Need for Family Participation in Patient Care: Yes (Comment) Care giver support system in place?: Yes (comment) Current home services: DME, Hospice Criminal Activity/Legal Involvement Pertinent to Current Situation/Hospitalization: No - Comment as needed  Activities of Daily Living Home Assistive Devices/Equipment: Cane (specify quad or straight), CBG Meter, Eyeglasses, Walker (specify type), Shower chair without back(single point cane, front wheeled walker, 4 wheeled walker, sleeps in a recliner) ADL Screening (condition at time of admission) Patient's cognitive ability adequate to safely complete daily activities?: No Is the patient deaf or have difficulty hearing?: Yes(hoh) Does the patient have difficulty seeing,  even when wearing glasses/contacts?: Yes Does the patient have difficulty concentrating, remembering, or making decisions?: Yes Patient able to express need for assistance with ADLs?: Yes Does the patient have difficulty dressing or bathing?: Yes Independently performs ADLs?: No Communication: Independent Dressing (OT): Dependent Is this a change  from baseline?: Change from baseline, expected to last >3 days Grooming: Dependent Is this a change from baseline?: Change from baseline, expected to last >3 days Feeding: Needs assistance Is this a change from baseline?: Pre-admission baseline Bathing: Dependent Is this a change from baseline?: Change from baseline, expected to last >3 days Toileting: Dependent Is this a change from baseline?: Change from baseline, expected to last >3days In/Out Bed: Dependent Is this a change from baseline?: Change from baseline, expected to last >3 days Walks in Home: Dependent Is this a change from baseline?: Change from baseline, expected to last >3 days Does the patient have difficulty walking or climbing stairs?: Yes(secondary to weakness) Weakness of Legs: Both Weakness of Arms/Hands: Both  Permission Sought/Granted Permission sought to share information with : Family Supports, Other (comment), Facility Contact Representative([facility]) Permission granted to share information with : Yes, Verbal Permission Granted              Emotional Assessment Appearance:: Appears stated age Attitude/Demeanor/Rapport: Lethargic Affect (typically observed): Calm Orientation: : Oriented to Self Alcohol / Substance Use: Not Applicable Psych Involvement: No (comment)  Admission diagnosis:  Hypokalemia [E87.6] Hypotension, unspecified hypotension type [I95.9] Altered mental status, unspecified altered mental status type [R41.82] Hyperosmolar hyperglycemic state (HHS) ([place]) [E11.00, E11.65] Patient Active Problem List   Diagnosis Date Noted  . Hyperosmolar hyperglycemic state (HHS) ([place]) 10/09/2019  . Acute on chronic systolic heart failure ([place]) 06/01/2018  . Severe aortic stenosis 06/01/2018  . Hypoxia   . Cellulitis 05/30/2018  . Type 2 diabetes mellitus ([place]) 05/05/2018  . S/P PICC central line placement 02/04/2018  . Ulcers of both lower legs ([place]) 01/27/2018  . Bacteremia due to  group B Streptococcus   . Hypocalcemia 08/03/2017  . Severe sepsis ([place]) 07/29/2017  . Wrist pain 01/30/2015  . De Quervain's tenosynovitis, right 01/03/2015  . Sick sinus syndrome ([place]) 07/02/2014  . Complete heart block ([place]) 07/02/2014  . Trigger finger, acquired 06/07/2014  . CKD (chronic kidney disease), stage III 05/13/2014  . Encounter for therapeutic drug monitoring 02/15/2014  . Thrombus 12/05/2013  . Ischemic leg 11/01/2013  . Low back pain 10/20/2013  . Depressive disorder, not elsewhere classified 10/20/2013  . Primary open angle glaucoma of both eyes, severe stage 08/06/2013  . Venous insufficiency 07/07/2013  . Postural dizziness 07/07/2013  . Encounter for long-term (current) use of other medications 06/27/2013  . Pseudophakia of both eyes 12/31/2012  . S/P placement of cardiac pacemaker 12/31/2012  . A-fib ([place]) 06/25/2011  . Pre-operative cardiovascular examination 04/29/2011  . 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 B12 DEFICIENCY 12/18/2010  . PSEUDOCYST, PANCREAS 12/17/2010  . DIASTOLIC HEART FAILURE, CHRONIC 04/16/2010  . SINUSITIS, CHRONIC 12/11/2009  . Obstructive sleep apnea 11/08/2009  . OBESITY-MORBID (>100') 10/29/2009  . HYPOGONADISM 10/01/2009  . 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 Hearing loss 04/26/2009  . FATTY LIVER DISEASE 06/20/2008  . COPD (chronic obstructive pulmonary disease) ([place]) 05/18/2008  . HYPERCHOLESTEROLEMIA 01/18/2008  . 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 Coronary atherosclerosis 07/06/2007  . GERD 07/06/2007   PCP:  Pleas Koch, NP Pharmacy:   [facility] - [place], [address]  [place] [address] Phone: [phone] Fax: [phone]  [id number] [place] [place]: [phone]: [phone] [place] [address] [place] [place] [address]Phone: [phone]: [phone]    Social Determinants of Health (SDOH) Interventions    Readmission Risk Interventions Readmission Risk Prevention Plan 10/10/2019  Transportation Screening Complete  PCP or Specialist Appt within 3-5 Days Complete  Social Work Consult for [place]/Counseling Complete  Palliative Care Screening Complete  Some recent data might be hidden

## 2019-10-12 ENCOUNTER — Inpatient Hospital Stay (HOSPITAL_COMMUNITY)

## 2019-10-12 LAB — CBC WITH DIFFERENTIAL/PLATELET
Abs Immature Granulocytes: 0.03 10*3/uL (ref 0.00–0.07)
Basophils Absolute: 0 10*3/uL (ref 0.0–0.1)
Basophils Relative: 0 %
Eosinophils Absolute: 0.1 10*3/uL (ref 0.0–0.5)
Eosinophils Relative: 2 %
HCT: 36.3 % — ABNORMAL LOW (ref 39.0–52.0)
Hemoglobin: 11.5 g/dL — ABNORMAL LOW (ref 13.0–17.0)
Immature Granulocytes: 0 %
Lymphocytes Relative: 22 %
Lymphs Abs: 1.9 10*3/uL (ref 0.7–4.0)
MCH: 31.6 pg (ref 26.0–34.0)
MCHC: 31.7 g/dL (ref 30.0–36.0)
MCV: 99.7 fL (ref 80.0–100.0)
Monocytes Absolute: 0.7 10*3/uL (ref 0.1–1.0)
Monocytes Relative: 7 %
Neutro Abs: 6.1 10*3/uL (ref 1.7–7.7)
Neutrophils Relative %: 69 %
Platelets: 146 10*3/uL — ABNORMAL LOW (ref 150–400)
RBC: 3.64 MIL/uL — ABNORMAL LOW (ref 4.22–5.81)
RDW: 16.2 % — ABNORMAL HIGH (ref 11.5–15.5)
WBC: 8.9 10*3/uL (ref 4.0–10.5)
nRBC: 0 % (ref 0.0–0.2)

## 2019-10-12 LAB — RENAL FUNCTION PANEL
Albumin: 2.5 g/dL — ABNORMAL LOW (ref 3.5–5.0)
Anion gap: 11 (ref 5–15)
BUN: 35 mg/dL — ABNORMAL HIGH (ref 8–23)
CO2: 23 mmol/L (ref 22–32)
Calcium: 7.9 mg/dL — ABNORMAL LOW (ref 8.9–10.3)
Chloride: 97 mmol/L — ABNORMAL LOW (ref 98–111)
Creatinine, Ser: 1.72 mg/dL — ABNORMAL HIGH (ref 0.61–1.24)
GFR calc Af Amer: 44 mL/min — ABNORMAL LOW (ref >60)
GFR calc non Af Amer: 38 mL/min — ABNORMAL LOW (ref >60)
Glucose, Bld: 250 mg/dL — ABNORMAL HIGH (ref 70–99)
Phosphorus: 2.5 mg/dL (ref 2.5–4.6)
Potassium: 3.3 mmol/L — ABNORMAL LOW (ref 3.5–5.1)
Sodium: 131 mmol/L — ABNORMAL LOW (ref 135–145)

## 2019-10-12 LAB — GLUCOSE, CAPILLARY
Glucose-Capillary: 205 mg/dL — ABNORMAL HIGH (ref 70–99)
Glucose-Capillary: 295 mg/dL — ABNORMAL HIGH (ref 70–99)
Glucose-Capillary: 251 mg/dL — ABNORMAL HIGH (ref 70–99)
Glucose-Capillary: 318 mg/dL — ABNORMAL HIGH (ref 70–99)

## 2019-10-12 LAB — HEPARIN LEVEL (UNFRACTIONATED): Heparin Unfractionated: 0.77 IU/mL — ABNORMAL HIGH (ref 0.30–0.70)

## 2019-10-12 LAB — APTT: aPTT: 71 seconds — ABNORMAL HIGH (ref 24–36)

## 2019-10-12 LAB — MAGNESIUM: Magnesium: 1.7 mg/dL (ref 1.7–2.4)

## 2019-10-12 MED ORDER — PANTOPRAZOLE SODIUM 40 MG PO TBEC
40.0000 mg | DELAYED_RELEASE_TABLET | Freq: Every day | ORAL | Status: DC
  Administered 2019-10-12 – 2019-10-15 (×4): 40 mg via ORAL
  Filled 2019-10-12 (×4): qty 1

## 2019-10-12 MED ORDER — TIMOLOL MALEATE 0.5 % OP SOLN
1.0000 [drp] | Freq: Two times a day (BID) | OPHTHALMIC | Status: DC
  Administered 2019-10-12 – 2019-10-15 (×6): 1 [drp] via OPHTHALMIC
  Filled 2019-10-12: qty 5

## 2019-10-12 MED ORDER — FAMOTIDINE 20 MG PO TABS
20.0000 mg | ORAL_TABLET | Freq: Every day | ORAL | Status: DC | PRN
  Administered 2019-10-12: 20 mg via ORAL
  Filled 2019-10-12: qty 1

## 2019-10-12 MED ORDER — ALUM & MAG HYDROXIDE-SIMETH 200-200-20 MG/5ML PO SUSP
30.0000 mL | Freq: Four times a day (QID) | ORAL | Status: DC | PRN
  Administered 2019-10-12: 30 mL via ORAL
  Filled 2019-10-12: qty 30

## 2019-10-12 MED ORDER — SODIUM CHLORIDE 0.9 % IV BOLUS
1000.0000 mL | Freq: Once | INTRAVENOUS | Status: AC
  Administered 2019-10-12: 1000 mL via INTRAVENOUS

## 2019-10-12 NOTE — Progress Notes (Signed)
Grandson- Dene Gentry, [employer]- stationed in [facility], wanting to get [employer] to come visit, need to get information to get leave approved

## 2019-10-12 NOTE — Progress Notes (Signed)
ANTICOAGULATION CONSULT NOTE - Follow Up Consult  Pharmacy Consult for heparin Indication: hx atrial fibrillation  Allergies  Allergen Reactions  . Actos [Pioglitazone] Swelling  . Timolol Other (See Comments)    Slow heart rate but tolerates Cosopt (dorzolamide-timolol) Pt has a rx for Timolol and uses it for glaucoma   . Ceftriaxone Rash    Patient Measurements: Height: 5\' 10"  (177.8 cm) Weight: 243 lb 9.7 oz (110.5 kg) IBW/kg (Calculated) : 73 Heparin Dosing Weight: 103 kg  Vital Signs: Temp: 97.8 F (36.6 C) (11/04 0754) Temp Source: Oral (11/04 0754) BP: 96/58 (11/04 0400) Pulse Rate: 109 (11/04 0400)  Labs: Recent Labs    10/10/19 1035 10/10/19 1501  10/11/19 0748 10/11/19 1554 10/12/19 0524  HGB 11.2*  --   --  11.5*  --  11.5*  HCT 35.1*  --   --  37.5*  --  36.3*  PLT 160  --   --  147*  --  146*  APTT 108*  --    < > 93* 87* 71*  HEPARINUNFRC 1.20*  --    < > 0.94* 1.01* 0.77*  CREATININE  --  2.28*  --  1.99*  --  1.72*   < > = values in this interval not displayed.    Estimated Creatinine Clearance: 45.5 mL/min (A) (by C-G formula based on SCr of 1.72 mg/dL (H)).   Medications:  - on  Eliquis 5 mg bid PTA (Last dose taken on 11/1 at 11a)  Assessment: Patient's a 78 y.o M with hx afib on Eliquis PTA, presented to the ED on 11/1 with AMS.  She was started on abx for suspected sepsis secondary to UTI and anticoag. Transitioned to heparin drip on admission.  Today, 10/12/2019: - aPTT is therapeutic at 71 secs; Heparin level is elevated but trending down to 0.77 - Note that recent Eliquis can falsely elevate heparin levels. Therefore, will titrate/adjust heparin infusion based on aPTT for now until heparin level correlates with aPTT - CBC: hgb stable today; plts low but unchanged from yesterday - no bleeding or infusion related issues noted  Goal of Therapy:  Heparin level 0.3-0.7 units/ml aPTT 66-102 seconds Monitor platelets by anticoagulation  protocol: Yes   Plan:  - Continue heparin drip at 950 units/hr - Daily aPTT, HL, CBC - Monitor for s/s bleeding - F/U plans to transition back to [place], PharmD, BCPS 10/12/2019,8:08 AM

## 2019-10-12 NOTE — Progress Notes (Signed)
PROGRESS NOTE  [name] BEYE [id number] DOB: 1941/02/14 DOA: 10/09/2019 PCP: Pleas Koch, NP   LOS: 3 days   Brief Narrative / Interim history: 78 year old male with history of systolic CHF (EF [id number]), paroxysmal A. fib on Eliquis, sick sinus syndrome status post pacemaker, chronic hypoxic respiratory failure on 5 L at baseline, type 2 diabetes mellitus, chronic kidney disease 3, bladder cancer, morbid obesity, PVD with chronic lower extremity wounds, under hospice care at home was admitted to the hospital on 10/09/2019 with altered mental status and poorly controlled diabetes (brittle diabetes with hyper and hypoglycemia episodes).  On admission he was hypotensive and has been intermittently since being in the hospital.  He was believed to be septic and was started on broad-spectrum antibiotics with Zosyn, and he has been improving.  Urine culture does not grow any organisms, blood cultures are still pending  Subjective / 24h Interval events: Quite emotional this morning, he overall is feeling well but tells me he wants to go home but understands that his wife is unable to take care of him.  Has no chest pain, no abdominal pain, no nausea or vomiting  Assessment & Plan: Active Problems:   Hyperosmolar hyperglycemic state (HHS) ([place])   Principal Problem SIRS with concern for infection -Patient placed on IV Zosyn, he seems to be responding to that, continue.  Hypertension is improving, he is requiring intermittent fluid boluses including 1 overnight -Follow cultures -he doesn't have significant LE ulcerations / infections to explain his sepsis. Given some toe lesions will obtain plain films to r/o osteo / deeper infections  Active Problems Acute metabolic encephalopathy -Likely in the setting of infection, improved with antibiotics  Hypokalemia -Continue to monitor and replete  Acute kidney injury on chronic kidney disease stage III -Baseline creatinine 1.6-2, currently at  baseline following IV fluids  Paroxysmal A. fib/history of SSS status post pacemaker -Currently on heparin infusion, once he has good p.o. intake will transition back to Eliquis -Paced on telemetry  LE wounds -wound care consulted  Scheduled Meds: . amiodarone  200 mg Oral Daily  . brimonidine  1 drop Left Eye TID  . Chlorhexidine Gluconate Cloth  6 each Topical Daily  . dorzolamide  1 drop Both Eyes BID  . fluticasone  2 spray Each Nare Daily  . hydrocerin   Topical Daily  . insulin aspart  0-5 Units Subcutaneous QHS  . insulin aspart  0-9 Units Subcutaneous TID WC  . ipratropium-albuterol  3 mL Nebulization BID  . latanoprost  1 drop Left Eye QHS  . levothyroxine  75 mcg Oral Q0600  . loratadine  10 mg Oral Daily  . mouth rinse  15 mL Mouth Rinse BID  . rosuvastatin  10 mg Oral q1800  . sodium chloride flush  3 mL Intravenous Q12H  . timolol  1 drop Both Eyes BID   Continuous Infusions: . sodium chloride 125 mL/hr at 10/12/19 1043  . heparin 950 Units/hr (10/12/19 0300)  . piperacillin-tazobactam (ZOSYN)  IV Stopped (10/12/19 1027)   PRN Meds:.acetaminophen **OR** acetaminophen, albuterol, guaiFENesin, guaiFENesin-dextromethorphan, HYDROmorphone HCl, ondansetron **OR** ondansetron (ZOFRAN) IV, phenol  DVT prophylaxis: heparin Code Status: DNR Family Communication: d/w patient Disposition Plan: needs placement  Consultants:  None   Procedures:  None   Microbiology  Urine cultures-no growth, final Blood cultures 11/1-no growth 1 day, last updated 11/3 SARS-CoV-2-negative MRSA PCR screening-negative  Antimicrobials: Zosyn 10/09/2019 >>   Objective: Vitals:   10/12/19 0312 10/12/19 0400 10/12/19 0754 10/12/19 [id number]  BP:  (!) 96/58    Pulse:  (!) 109    Resp:  18    Temp: 97.9 F (36.6 C)  97.8 F (36.6 C)   TempSrc: Axillary  Oral   SpO2:  100%  100%  Weight:      Height:        Intake/Output Summary (Last 24 hours) at 10/12/2019 1045 Last data filed  at 10/12/2019 0754 Gross per 24 hour  Intake 3779.27 ml  Output 1550 ml  Net 2229.27 ml   Filed Weights   10/09/19 2200 10/10/19 0409 10/11/19 0350  Weight: 106.2 kg 106.2 kg 110.5 kg    Examination:  Constitutional: No apparent distress, sleeping when I entered the room but wakes up easily Eyes: No scleral icterus ENMT: Mucous membranes are moist.  Respiratory: clear to auscultation bilaterally, no wheezing, no crackles. Normal respiratory effort.  Cardiovascular: Regular rate and rhythm, no significant murmurs heard Abdomen: no tenderness. Bowel sounds positive.  Neurologic: Nonfocal, equal strength  Data Reviewed: I have independently reviewed following labs and imaging studies   CBC: Recent Labs  Lab 10/09/19 1654 10/10/19 1035 10/11/19 0748 10/12/19 0524  WBC 12.0* 8.5 8.8 8.9  NEUTROABS 9.3*  --  6.0 6.1  HGB 13.4 11.2* 11.5* 11.5*  HCT 41.5 35.1* 37.5* 36.3*  MCV 95.6 98.9 101.6* 99.7  PLT 202 160 147* 123456*   Basic Metabolic Panel: Recent Labs  Lab 10/09/19 2320 10/10/19 0317 10/10/19 0732 10/10/19 1501 10/11/19 0748 10/12/19 0524  NA 133* 134* 136 133* 132* 131*  K 2.3* 2.3* 2.4* 3.3* 3.7 3.3*  CL 89* 91* 94* 93* 94* 97*  CO2 30 30 28 28 26 23   GLUCOSE 221* 73 82 289* 248* 250*  BUN 45* 43* 43* 43* 37* 35*  CREATININE 2.35* 2.26* 2.06* 2.28* 1.99* 1.72*  CALCIUM 8.3* 8.4* 8.4* 7.9* 8.1* 7.9*  MG 2.0  --   --   --  1.7 1.7  PHOS 2.9  --   --   --  2.4* 2.5   GFR: Estimated Creatinine Clearance: 45.5 mL/min (A) (by C-G formula based on SCr of 1.72 mg/dL (H)). Liver Function Tests: Recent Labs  Lab 10/07/19 0800 10/09/19 2320 10/11/19 0748 10/12/19 0524  AST 61* 58*  --   --   ALT 63* 55*  --   --   ALKPHOS 266* 180*  --   --   BILITOT 0.5 0.8  --   --   PROT 6.2 6.8  --   --   ALBUMIN 3.1* 2.6* 2.5* 2.5*   No results for input(s): LIPASE, AMYLASE in the last 168 hours. No results for input(s): AMMONIA in the last 168 hours. Coagulation  Profile: No results for input(s): INR, PROTIME in the last 168 hours. Cardiac Enzymes: No results for input(s): CKTOTAL, CKMB, CKMBINDEX, TROPONINI in the last 168 hours. BNP (last 3 results) No results for input(s): PROBNP in the last 8760 hours. HbA1C: Recent Labs    10/09/19 2320  HGBA1C 9.9*   CBG: Recent Labs  Lab 10/11/19 0756 10/11/19 1152 10/11/19 1652 10/11/19 2140 10/12/19 0747  GLUCAP 224* 228* 324* 306* 205*   Lipid Profile: No results for input(s): CHOL, HDL, LDLCALC, TRIG, CHOLHDL, LDLDIRECT in the last 72 hours. Thyroid Function Tests: No results for input(s): TSH, T4TOTAL, FREET4, T3FREE, THYROIDAB in the last 72 hours. Anemia Panel: No results for input(s): VITAMINB12, FOLATE, FERRITIN, TIBC, IRON, RETICCTPCT in the last 72 hours. Urine analysis:    Component Value  Date/Time   COLORURINE YELLOW 10/09/2019 [place] (A) 10/09/2019 1835   LABSPEC 1.008 10/09/2019 1835   PHURINE 6.0 10/09/2019 1835   GLUCOSEU 50 (A) 10/09/2019 1835   GLUCOSEU 100 09/30/2013 1535   HGBUR LARGE (A) 10/09/2019 [place] 10/09/2019 [place] 10/09/2019 [place] (A) 10/09/2019 1835   UROBILINOGEN 1.0 10/01/2013 1003   NITRITE NEGATIVE 10/09/2019 1835   LEUKOCYTESUR LARGE (A) 10/09/2019 1835   Sepsis Labs: Invalid input(s): PROCALCITONIN, LACTICIDVEN  Recent Results (from the past 240 hour(s))  Culture, blood (routine x 2)     Status: None (Preliminary result)   Collection Time: 10/09/19  4:55 PM   Specimen: BLOOD  Result Value Ref Range Status   Specimen Description   Final    BLOOD RIGHT WRIST Performed at [facility] [address] [place], [place] [address]    Special Requests   Final    BOTTLES DRAWN AEROBIC AND ANAEROBIC Blood Culture adequate volume Performed at [facility] [address] [place], [place] [address]    Culture   Final    NO GROWTH 1 DAY  Performed at [facility], [place] [address] [place], [place] [address]    Report Status PENDING  Incomplete  Culture, blood (routine x 2)     Status: None (Preliminary result)   Collection Time: 10/09/19  5:00 PM   Specimen: BLOOD RIGHT FOREARM  Result Value Ref Range Status   Specimen Description   Final    BLOOD RIGHT FOREARM Performed at [facility] [address], [place], [place] [address]    Special Requests   Final    BOTTLES DRAWN AEROBIC AND ANAEROBIC Blood Culture adequate volume Performed at [facility] [address], [place], [place] [address]    Culture   Final    NO GROWTH 1 DAY Performed at [facility], [place] [address], [place], [place] [address]    Report Status PENDING  Incomplete  SARS CORONAVIRUS 2 (TAT 6-24 HRS) Nasopharyngeal Nasopharyngeal Swab     Status: None   Collection Time: 10/09/19  5:34 PM   Specimen: Nasopharyngeal Swab  Result Value Ref Range Status   SARS Coronavirus 2 NEGATIVE NEGATIVE Final    Comment: (NOTE) SARS-CoV-2 target nucleic acids are NOT DETECTED. The SARS-CoV-2 RNA is generally detectable in upper and lower respiratory specimens during the acute phase of infection. Negative results do not preclude SARS-CoV-2 infection, do not rule out co-infections with other pathogens, and should not be used as the sole basis for treatment or other patient management decisions. Negative results must be combined with clinical observations, patient history, and epidemiological information. The expected result is Negative. Fact Sheet for Patients: SugarRoll.be Fact Sheet for Healthcare Providers: https://www.woods-mathews.com/ This test is not yet approved or cleared by the [place] FDA and  has been authorized for detection and/or diagnosis of SARS-CoV-2 by FDA under an Emergency Use Authorization (EUA). This EUA will remain  in effect (meaning  this test can be used) for the duration of the COVID-19 declaration under Section 56 4(b)(1) of the Act, 21 U.S.C. section 360bbb-3(b)(1), unless the authorization is terminated or revoked sooner. Performed at [facility], [place] [address] [place], [place] [address]   Urine culture     Status: None   Collection Time: 10/09/19  6:35 PM   Specimen: Urine, Random  Result Value Ref  Range Status   Specimen Description   Final    URINE, RANDOM Performed at [facility], [place] [address], [place], [place] [address]    Special Requests   Final    NONE Performed at [facility], [place] [address], [place], [place] [address]    Culture   Final    NO GROWTH Performed at [facility], [place] [address], [place], [place] [address]    Report Status 10/10/2019 FINAL  Final  MRSA PCR Screening     Status: None   Collection Time: 10/09/19 10:00 PM   Specimen: Nasal Mucosa; Nasopharyngeal  Result Value Ref Range Status   MRSA by PCR NEGATIVE NEGATIVE Final    Comment:        The GeneXpert MRSA Assay (FDA approved for NASAL specimens only), is one component of a comprehensive MRSA colonization surveillance program. It is not intended to diagnose MRSA infection nor to guide or monitor treatment for MRSA infections. Performed at [facility], [place] [address] [place], [place] [address]       Radiology Studies: No results found.   Marzetta Board, MD, PhD [facility]  Contact via  [email or web address]  [place] P: [phone] F: [phone]

## 2019-10-12 NOTE — Progress Notes (Signed)
Inpatient Diabetes Program Recommendations  AACE/ADA: New Consensus Statement on Inpatient Glycemic Control (2015)  Target Ranges:  Prepandial:   less than 140 mg/dL      Peak postprandial:   less than 180 mg/dL (1-2 hours)      Critically ill patients:  140 - 180 mg/dL    Results for [name], [name] (MRN [id number]) as of 10/12/2019 10:11  Ref. Range 10/11/2019 07:56 10/11/2019 11:52 10/11/2019 16:52 10/11/2019 21:40 10/12/2019 07:47  Glucose-Capillary Latest Ref Range: 70 - 99 mg/dL 224 (H) 228 (H) 324 (H) 306 (H) 205 (H)    Admit with: AMS/ HHS/ Possible Sepsis secondary to UTI/ Severe Hypokalemia  History: DM, CKD, Bladder cancer, CHF  Home DM Meds: Regular Insulin (U100): 300 units AM, 210 units Lunch, 210 units Dinner, 190 units Bedtime  Current Orders: Novolog Sensitive Correction Scale/ SSI (0-9 units) TID AC + HS   Hospice services at home.  Used to see Dr. [name] with [name] for diabetes care.  Hasn't seen ENDO since October 2019.   Wife reports poor PO intake at home over the last few weeks with poor glucose control.  DM Coordinator spoke w/ pt's wife on 11/2 by phone. See note.    Pt on very unusual insulin regimen.  Extremely large doses of Regular Insulin without basal insulin.  May fare better with Regular Insulin SSI + basal Insulin.    MD- Please consider starting weight based dose of Lantus today.  CBG 224 this AM.  Recommend the following:  1. Start Lantus 10 units Daily (0.1 units/kg)  2. Start low dose Novolog Meal Coverage:  Novolog 3 units TID with meals  (Please add the following Hold Parameters: Hold if pt eats <50% of meal, Hold if pt NPO)     --Will follow patient during hospitalization--  Tama Headings RN, MSN, BC-ADM Inpatient Diabetes Coordinator Team Pager [phone] (8a-5p)

## 2019-10-12 NOTE — Evaluation (Signed)
Occupational Therapy Evaluation Patient Details Name: [name] MRN: [id number] DOB: October 01, 1941 Today's Date: 10/12/2019    History of Present Illness Pt is 78 yo male admitted with AMS, hypoactive delirium and hypotension.  He was current with home hospice services.  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     Clinical Impression   Pt was admitted for the above. Prior to admission, he was mod I for adls.  He did have a wound RN come in weekly to change dressings.  Pt's BP was soft at time of OT eval, so bed level eval performed.  Pt's priority is getting up and moving.  Will focus on this during his acute stay.    Follow Up Recommendations  SNF    Equipment Recommendations  (defer to next venue)    Recommendations for Other Services       Precautions / Restrictions Precautions Precautions: Fall Restrictions Weight Bearing Restrictions: No      Mobility Bed Mobility     Rolling: Mod assist;+2 for physical assistance         General bed mobility comments: assist to roll both ways using rails  Transfers                 General transfer comment: not performed due to BP    Balance                                           ADL either performed or assessed with clinical judgement   ADL Overall ADL's : Needs assistance/impaired Eating/Feeding: Independent   Grooming: Set up   Upper Body Bathing: Minimal assistance   Lower Body Bathing: Total assistance;+2 for physical assistance   Upper Body Dressing : Minimal assistance   Lower Body Dressing: Total assistance;+2 for physical assistance                 General ADL Comments: Pt was on bedpan due to soft BP.  Assisted with rolling to remove.  Pt wanted change of position. Attempted chair  position of bed, but pt could not tolerate     Vision         Perception     Praxis      Pertinent Vitals/Pain Pain Assessment: No/denies pain     Hand Dominance Left   Extremity/Trunk Assessment Upper Extremity Assessment RUE Deficits / Details: ROM limited to approximately 130.  grossly 4/5 LUE Deficits / Details: grossly 4/5.  Pt is not interested in using theraband on his own while in the hospital        Cervical / Trunk Assessment Cervical / Trunk Assessment: Kyphotic(difficulty keeping head back on pillows)   Communication Communication Communication: HOH   Cognition Arousal/Alertness: Awake/alert Behavior During Therapy: WFL for tasks assessed/performed Overall Cognitive Status: Within Functional Limits for tasks assessed                                     General Comments       Exercises     Shoulder Instructions      Home Living Family/patient expects to be discharged to:: Skilled nursing  facility                                        Prior Functioning/Environment      ADL's / Homemaking Assistance Needed: Up until the past week he could perform ADLs   Comments: pt used long stick for LB adls and wore slip on shoes.  RN came weekly to dress legs        OT Problem List: Decreased strength;Decreased activity tolerance;Decreased knowledge of use of DME or AE;Cardiopulmonary status limiting activity(balance NT)      OT Treatment/Interventions: Self-care/ADL training;Energy conservation;DME and/or AE instruction;Patient/family education;Therapeutic activities(likely balance)    OT Goals(Current goals can be found in the care plan section) Acute Rehab OT Goals Patient Stated Goal: get up out of bed OT Goal Formulation: With patient Time For Goal Achievement: 10/26/19 Potential to Achieve Goals: Fair ADL Goals Pt Will Transfer to Toilet: with min assist;with +2 assist;bedside commode;stand pivot  transfer Additional ADL Goal #1: pt will go from sit to stand with min +2 and maintain balance x 2 minutes with min A for adls Additional ADL Goal #2: pt will perform bed mobility with min A from hospital bed in preparation for adls/toilet transfers  OT Frequency: Min 2X/week   Barriers to D/C:            Co-evaluation              AM-PAC OT "6 Clicks" Daily Activity     Outcome Measure Help from another person eating meals?: None Help from another person taking care of personal grooming?: A Little Help from another person toileting, which includes using toliet, bedpan, or urinal?: Total Help from another person bathing (including washing, rinsing, drying)?: A Lot Help from another person to put on and taking off regular upper body clothing?: A Little Help from another person to put on and taking off regular lower body clothing?: Total 6 Click Score: 14   End of Session    Activity Tolerance: Patient limited by fatigue Patient left: in bed;with call bell/phone within reach;with bed alarm set  OT Visit Diagnosis: Muscle weakness (generalized) (M62.81)                Time: [id number] OT Time Calculation (min): 27 min Charges:  OT General Charges $OT Visit: 1 Visit OT Evaluation $OT Eval Low Complexity: 1 Low OT Treatments $Self Care/Home Management : 8-22 mins  Lesle Chris, OTR/L Acute Rehabilitation Services [phone] [facility] pager [phone] office 10/12/2019  Aubreyanna Dorrough 10/12/2019, 11:53 AM

## 2019-10-12 NOTE — Telephone Encounter (Signed)
Okay I researched this further.  We cannot provide a medication/vaccine for an outside nurse/person to administer for multiple reasons.  Mainly because we are not a pharmacy that can distribute medication and cannot bill for the vaccine without being the one to administer.   But, I did find out from [facility] that hospice can contact them directly (or we can call in an order to them) and they can fill the vaccine and provide it to the hospice nurse to administer to the patient. If we call in, we need to specify that this is for a hospice patient and nurse to pick up and administer.   If patient is still needing the vaccine, this would be the best, safest way to get him the vaccine administered by hospice in the home.   I will copy this to PCP and the CMA to follow up on as needed.   Thanks.

## 2019-10-12 NOTE — Progress Notes (Signed)
[facility] 1234 [facility] GIP RN Note @ 200 pm  [name] was transported to the ED for evaluation of altered level of consciousness on 10/09/19.  He is under hospice services with a terminal diagnosis of congestive heart failure per Dr. Karie Georges. Family elected to call EMS during visit with hospice RN.  This is a related admission. He is admitted with altered level of consciousness, hypotension, rule out sepsis.  Pt had several episodes of hypotension last night that had to be treated.  Currently, without complaints that are not being managed. Placement for pt being explored.  We have a bed at [facility], but the wife is not fully sure what she wants to do.  TOC manager updated.  V/S: 97.4 oral, 95/50, HR 100, RR 17, SPO2 100% 2 lpm [place] I&O:  4536/3286 Abnormal lab work: Na 131, K 3.3, chloride 97, glucose 250, BUN 35, Cr 1.72, gfr 38, RBC 3.64, hgb 11.5, HCT 36.3 IVs/PRNs: albumin 25 g IV x 1, NS 1 L IV bolus x 1, zosyn 3.375 mg IV TID, heparin 950 units/hr IV   Problem List:  Sepsis: felt to be urosepsis, zosyn as above, BP fluctuations throughout the night Altered LOC: corrected, at baseline Hypotension: treated twice last night, 1L bolus and albumin   D/C planning: ongoing, cannot return home, wife unable to continue caring for him. Placement at [facility] explored for EOL, [facility] bed at [facility], but wife is not sure she wants that.  ACC SW discussing with wife GOC: ongoing, pt DNR, current full scope of treatment, conversations to continue with wife  IDT: updated Family: updated  Please call with any hospice related questions, Venia Carbon RN, BSN, [place] (in [place]) [phone]

## 2019-10-12 NOTE — Telephone Encounter (Signed)
Noted  

## 2019-10-12 NOTE — TOC Progression Note (Signed)
Transition of Care [facility]) - Progression Note    Patient Details  Name: [name] MRN: [id number] Date of Birth: 06/13/1941  Transition of Care [facility]) CM/SW [place], [place] Phone Number: 10/12/2019, 3:01 PM  Clinical Narrative:    [facility] team working diligently with patient spouse to determine disposition. Residential Hospice vs. SNF.  TOC staff will continue to follow for discharge needs.    Expected Discharge Plan: [place] Barriers to Discharge: Continued Medical Work up  Expected Discharge Plan and Services Expected Discharge Plan: [place] In-house Referral: Clinical Social Work   Post Acute Care Choice: Nursing Home Living arrangements for the past 2 months: [place] Determinants of Health (SDOH) Interventions    Readmission Risk Interventions Readmission Risk Prevention Plan 10/10/2019  Transportation Screening Complete  PCP or Specialist Appt within 3-5 Days Complete  Social Work Consult for [place] Planning/Counseling Complete  Palliative Care Screening Complete  Some recent data might be hidden

## 2019-10-12 NOTE — Progress Notes (Signed)
RN checked BP and found to be 63/45. MD notified and ordered 1L NS bolus. Patient able to answer questions appropriately at this time. Will continue to monitor.

## 2019-10-13 DIAGNOSIS — N179 Acute kidney failure, unspecified: Secondary | ICD-10-CM

## 2019-10-13 DIAGNOSIS — Z515 Encounter for palliative care: Secondary | ICD-10-CM

## 2019-10-13 DIAGNOSIS — I872 Venous insufficiency (chronic) (peripheral): Secondary | ICD-10-CM

## 2019-10-13 DIAGNOSIS — I739 Peripheral vascular disease, unspecified: Secondary | ICD-10-CM

## 2019-10-13 DIAGNOSIS — R6510 Systemic inflammatory response syndrome (SIRS) of non-infectious origin without acute organ dysfunction: Secondary | ICD-10-CM

## 2019-10-13 DIAGNOSIS — E869 Volume depletion, unspecified: Secondary | ICD-10-CM

## 2019-10-13 DIAGNOSIS — I480 Paroxysmal atrial fibrillation: Secondary | ICD-10-CM

## 2019-10-13 DIAGNOSIS — I9589 Other hypotension: Secondary | ICD-10-CM

## 2019-10-13 DIAGNOSIS — E43 Unspecified severe protein-calorie malnutrition: Secondary | ICD-10-CM

## 2019-10-13 DIAGNOSIS — N183 Chronic kidney disease, stage 3 unspecified: Secondary | ICD-10-CM

## 2019-10-13 DIAGNOSIS — L97509 Non-pressure chronic ulcer of other part of unspecified foot with unspecified severity: Secondary | ICD-10-CM

## 2019-10-13 DIAGNOSIS — S90426A Blister (nonthermal), unspecified lesser toe(s), initial encounter: Secondary | ICD-10-CM

## 2019-10-13 DIAGNOSIS — Z7189 Other specified counseling: Secondary | ICD-10-CM

## 2019-10-13 DIAGNOSIS — E1142 Type 2 diabetes mellitus with diabetic polyneuropathy: Secondary | ICD-10-CM

## 2019-10-13 LAB — GLUCOSE, CAPILLARY
Glucose-Capillary: 206 mg/dL — ABNORMAL HIGH (ref 70–99)
Glucose-Capillary: 278 mg/dL — ABNORMAL HIGH (ref 70–99)
Glucose-Capillary: 188 mg/dL — ABNORMAL HIGH (ref 70–99)

## 2019-10-13 LAB — CBC
HCT: 38.2 % — ABNORMAL LOW (ref 39.0–52.0)
Hemoglobin: 11.9 g/dL — ABNORMAL LOW (ref 13.0–17.0)
MCH: 31.8 pg (ref 26.0–34.0)
MCHC: 31.2 g/dL (ref 30.0–36.0)
MCV: 102.1 fL — ABNORMAL HIGH (ref 80.0–100.0)
Platelets: 135 10*3/uL — ABNORMAL LOW (ref 150–400)
RBC: 3.74 MIL/uL — ABNORMAL LOW (ref 4.22–5.81)
RDW: 16.3 % — ABNORMAL HIGH (ref 11.5–15.5)
WBC: 9.2 10*3/uL (ref 4.0–10.5)
nRBC: 0 % (ref 0.0–0.2)

## 2019-10-13 LAB — RENAL FUNCTION PANEL
Albumin: 2.4 g/dL — ABNORMAL LOW (ref 3.5–5.0)
Anion gap: 10 (ref 5–15)
BUN: 29 mg/dL — ABNORMAL HIGH (ref 8–23)
CO2: 23 mmol/L (ref 22–32)
Calcium: 8 mg/dL — ABNORMAL LOW (ref 8.9–10.3)
Chloride: 99 mmol/L (ref 98–111)
Creatinine, Ser: 1.38 mg/dL — ABNORMAL HIGH (ref 0.61–1.24)
GFR calc Af Amer: 57 mL/min — ABNORMAL LOW (ref >60)
GFR calc non Af Amer: 49 mL/min — ABNORMAL LOW (ref >60)
Glucose, Bld: 232 mg/dL — ABNORMAL HIGH (ref 70–99)
Phosphorus: 2.2 mg/dL — ABNORMAL LOW (ref 2.5–4.6)
Potassium: 3.5 mmol/L (ref 3.5–5.1)
Sodium: 132 mmol/L — ABNORMAL LOW (ref 135–145)

## 2019-10-13 LAB — APTT
aPTT: 56 seconds — ABNORMAL HIGH (ref 24–36)
aPTT: 106 seconds — ABNORMAL HIGH (ref 24–36)

## 2019-10-13 LAB — MAGNESIUM: Magnesium: 1.7 mg/dL (ref 1.7–2.4)

## 2019-10-13 LAB — HEPARIN LEVEL (UNFRACTIONATED): Heparin Unfractionated: 0.61 IU/mL (ref 0.30–0.70)

## 2019-10-13 MED ORDER — APIXABAN 5 MG PO TABS
5.0000 mg | ORAL_TABLET | Freq: Two times a day (BID) | ORAL | Status: DC
  Administered 2019-10-13 – 2019-10-14 (×2): 5 mg via ORAL
  Filled 2019-10-13 (×2): qty 1

## 2019-10-13 MED ORDER — POTASSIUM CHLORIDE CRYS ER 20 MEQ PO TBCR
40.0000 meq | EXTENDED_RELEASE_TABLET | Freq: Once | ORAL | Status: AC
  Administered 2019-10-13: 40 meq via ORAL
  Filled 2019-10-13: qty 2

## 2019-10-13 MED ORDER — K PHOS MONO-SOD PHOS DI & MONO 155-852-130 MG PO TABS
500.0000 mg | ORAL_TABLET | Freq: Three times a day (TID) | ORAL | Status: DC
  Administered 2019-10-13 – 2019-10-15 (×6): 500 mg via ORAL
  Filled 2019-10-13 (×9): qty 2

## 2019-10-13 MED ORDER — ALBUMIN HUMAN 25 % IV SOLN
25.0000 g | Freq: Four times a day (QID) | INTRAVENOUS | Status: DC
  Administered 2019-10-13 – 2019-10-14 (×3): 25 g via INTRAVENOUS
  Filled 2019-10-13 (×5): qty 50

## 2019-10-13 MED ORDER — SODIUM CHLORIDE 0.9 % IV SOLN
INTRAVENOUS | Status: DC
  Administered 2019-10-14: 75 mL/h via INTRAVENOUS
  Administered 2019-10-14: 22:00:00 via INTRAVENOUS

## 2019-10-13 NOTE — Consult Note (Signed)
ORTHOPAEDIC CONSULTATION  REQUESTING PHYSICIAN: Bonnell Public, MD  Chief Complaint: Ulceration bilateral feet.  HPI: [name] is a 78 y.o. male who presents with uncontrolled type 2 diabetes with severe protein caloric malnutrition peripheral vascular disease with venous insufficiency swelling of both legs as well as ulcerations to both feet.  Past Medical History:  Diagnosis Date  . Anticoagulated on Coumadin 05/05/2018  . Bladder cancer ([place]) 2014  . COPD (chronic obstructive pulmonary disease) (HCC)    CPAP  . Depression   . Diabetes mellitus    TypeII  . Diastolic dysfunction with chronic heart failure ([place])   . GERD (gastroesophageal reflux disease)   . H/O Legionnaire's disease 2003  . Hyperlipemia   . Hypothyroidism   . Morbid obesity ([place])   . OSA on CPAP   . Osteoarthritis    fingers  . Pancreatitis   . Peripheral neuropathy   . Persistent atrial fibrillation ([place])   . Renal insufficiency   . Sick sinus syndrome ([place]) 06/2010   s/p PPM by [employer]  . Venous insufficiency    Past Surgical History:  Procedure Laterality Date  . BELPHAROPTOSIS REPAIR Right    Glaucoma  . CARDIAC CATHETERIZATION     11-02-13  . CARDIOVERSION N/A 05/16/2014   Procedure: CARDIOVERSION;  Surgeon: Josue Hector, MD;  Location: [facility] ENDOSCOPY;  Service: Cardiovascular;  Laterality: N/A;  . CARDIOVERSION N/A 11/08/2014   Procedure: CARDIOVERSION;  Surgeon: Fay Records, MD;  Location: [place];  Service: Cardiovascular;  Laterality: N/A;  . CARDIOVERSION N/A 06/22/2015   Procedure: CARDIOVERSION;  Surgeon: Jerline Pain, MD;  Location: [place];  Service: Cardiovascular;  Laterality: N/A;  . CHOLECYSTECTOMY  2012  . CYSTOSCOPY W/ URETERAL STENT PLACEMENT Right 10/26/2013   Procedure: CYSTOSCOPY WITH RETROGRADE PYELOGRAM/URETERAL STENT PLACEMENT;  Surgeon: Alexis Frock, MD;  Location: [facility];  Service: Urology;  Laterality: Right;  . CYSTOSCOPY WITH URETEROSCOPY  AND STENT PLACEMENT Right 01/11/2014   Procedure: CYSTOSCOPY WITH URETEROSCOPY ,RIGHT RETROGRADE AND STENT CHANGE AND LASER OF URETERAL TUMOR;  Surgeon: Alexis Frock, MD;  Location: [facility];  Service: Urology;  Laterality: Right;  . CYSTOSCOPY/RETROGRADE/URETEROSCOPY Right 08/23/2014   Procedure: CYSTOSCOPY/RETROGRADE/ DIAGNOSTIC URETEROSCOPY/RIGHT RENAL STONE EXTRACTION;  Surgeon: Alexis Frock, MD;  Location: [facility];  Service: Urology;  Laterality: Right;  . EMBOLECTOMY Left 11/02/2013   Procedure: LEFT FEMORAL EMBOLECTOMY, LEFT FEMORAL ARTERY ENDARTERECTOMY WITH DACRON PATCH ANGIOPLASTY.;  Surgeon: Mal Misty, MD;  Location: [place];  Service: Vascular;  Laterality: Left;  . EYE SURGERY Left    cataract  . GROIN DEBRIDEMENT Right 05/08/2015   Procedure: REMOVAL OF RIGHT GROIN MASS;  Surgeon: Angelia Mould, MD;  Location: [place];  Service: Vascular;  Laterality: Right;  . HOLMIUM LASER APPLICATION Right 2/0/2542   Procedure: HOLMIUM LASER APPLICATION;  Surgeon: Alexis Frock, MD;  Location: [facility];  Service: Urology;  Laterality: Right;  . INSERT / REPLACE / REMOVE PACEMAKER  2011  . NASAL SEPTUM SURGERY  1967  . PACEMAKER PLACEMENT  06/2010   [facility] Accent RF DR, Model [id number] ( Serial number [id number])  . TEE WITHOUT CARDIOVERSION N/A 05/16/2014   Procedure: TRANSESOPHAGEAL ECHOCARDIOGRAM (TEE);  Surgeon: Josue Hector, MD;  Location: [facility] ENDOSCOPY;  Service: Cardiovascular;  Laterality: N/A;  . thromboembolectomy and four compartment fasciotomy Right 2009   leg  . TRANSURETHRAL RESECTION OF BLADDER TUMOR WITH GYRUS (TURBT-GYRUS) N/A 10/26/2013   Procedure: TRANSURETHRAL RESECTION OF BLADDER TUMOR  WITH GYRUS (TURBT-GYRUS);  Surgeon: Alexis Frock, MD;  Location: [facility];  Service: Urology;  Laterality: N/A;  . TRANSURETHRAL RESECTION OF BLADDER TUMOR WITH GYRUS (TURBT-GYRUS) N/A 01/11/2014   Procedure: TRANSURETHRAL RESECTION OF BLADDER TUMOR WITH GYRUS (TURBT-GYRUS);  Surgeon:  Alexis Frock, MD;  Location: [facility];  Service: Urology;  Laterality: N/A;  . WISDOM TOOTH EXTRACTION     Social History   Socioeconomic History  . Marital status: Married    Spouse name: Not on file  . Number of children: Not on file  . Years of education: Not on file  . Highest education level: Not on file  Occupational History  . Occupation: retired     Fish farm manager: RETIRED    Comment: Scientist, clinical (histocompatibility and immunogenetics)  . Financial resource strain: Not on file  . Food insecurity    Worry: Not on file    Inability: Not on file  . Transportation needs    Medical: Not on file    Non-medical: Not on file  Tobacco Use  . Smoking status: Former Smoker    Packs/day: 2.00    Years: 50.00    Pack years: 100.00    Types: Cigarettes    Quit date: 12/08/2006    Years since quitting: 12.8  . Smokeless tobacco: Never Used  Substance and Sexual Activity  . Alcohol use: No    Alcohol/week: 0.0 standard drinks    Comment: quit 3 years ago  . Drug use: No  . Sexual activity: Not Currently  Lifestyle  . Physical activity    Days per week: Not on file    Minutes per session: Not on file  . Stress: Not on file  Relationships  . Social Herbalist on phone: Not on file    Gets together: Not on file    Attends religious service: Not on file    Active member of club or organization: Not on file    Attends meetings of clubs or organizations: Not on file    Relationship status: Not on file  Other Topics Concern  . Not on file  Social History Narrative  . Not on file   Family History  Problem Relation Age of Onset  . Liver cancer Mother        deceased age 69  . Cancer Mother        liver cancer  . Heart attack Father   . Colon cancer Neg Hx    - negative except otherwise stated in the family history section Allergies  Allergen Reactions  . Actos [Pioglitazone] Swelling  . Timolol Other (See Comments)    Slow heart rate but tolerates Cosopt (dorzolamide-timolol) Pt  has a rx for Timolol and uses it for glaucoma   . Ceftriaxone Rash   Prior to Admission medications   Medication Sig Start Date End Date Taking? Authorizing Provider  acetaminophen (TYLENOL) 500 MG tablet Take 1,000 mg by mouth 2 (two) times daily as needed (pain/headache).    Yes [provider]  albuterol (VENTOLIN HFA) 108 (90 Base) MCG/ACT inhaler Inhale 2 puffs into the lungs every 6 (six) hours as needed for wheezing or shortness of breath. 11/23/17  Yes Parrett, Tammy S, NP  amiodarone (PACERONE) 200 MG tablet TAKE 1 TABLET EVERY DAY Patient taking differently: Take 200 mg by mouth daily.  01/05/19  Yes Burtis Junes, NP  Ascorbic Acid (VITAMIN C) 500 MG tablet Take 500 mg by mouth daily after breakfast.  Yes [provider]  Blood Glucose Monitoring Suppl (ACCU-CHEK AVIVA CONNECT) w/Device KIT 1 Package by Does not apply route 2 (two) times daily. 09/07/18  Yes Renato Shin, MD  brimonidine (ALPHAGAN P) 0.1 % SOLN Place 1 drop into the left eye 3 (three) times daily.    Yes [provider]  cholecalciferol (VITAMIN D) 1000 UNITS tablet Take 1,000 Units by mouth 2 (two) times daily.    Yes [provider]  CINNAMON PO Take 2,000 mg by mouth daily after breakfast.    Yes [provider]  dextromethorphan (DELSYM) 30 MG/5ML liquid Take 90 mg by mouth 2 (two) times daily as needed for cough.   Yes [provider]  Digestive Enzymes (MULTI-ENZYME) TABS Take 1 tablet by mouth daily after breakfast.    Yes [provider]  docusate sodium (COLACE) 100 MG capsule Take 100 mg by mouth daily as needed for mild constipation.    Yes [provider]  dorzolamide (TRUSOPT) 2 % ophthalmic solution Place 1 drop into both eyes 2 (two) times daily.   Yes [provider]  ELIQUIS 5 MG TABS tablet TAKE 1 TABLET TWICE DAILY Patient taking differently: Take 5 mg by mouth 2 (two) times daily.  04/19/19  Yes Burtis Junes, NP   ferrous sulfate 325 (65 FE) MG tablet Take 325 mg by mouth 2 (two) times daily with a meal.   Yes [provider]  fluticasone (FLONASE) 50 MCG/ACT nasal spray Place 2 sprays into both nostrils as needed (congestion).    Yes [provider]  glucose blood test strip Use as instructed to check blood sugar twice daily E11.69 09/07/18  Yes Renato Shin, MD  guaiFENesin 200 MG tablet Take 400 mg by mouth 2 (two) times daily. '400mg'$  in the morning and '600mg'$  in the evening    Yes [provider]  insulin regular (NOVOLIN R RELION) 100 units/mL injection 4 times daily before meals 300-210-210-190 units 09/24/18  Yes Renato Shin, MD  Lactobacillus (ACIDOPHILUS) CAPS capsule Take 1 capsule by mouth every evening.    Yes [provider]  Lancets (ACCU-CHEK SOFT TOUCH) lancets Use as instructed to check blood sugar twice daily E11.69 09/07/18  Yes Renato Shin, MD  levofloxacin (LEVAQUIN) 500 MG tablet Take 500 mg by mouth daily. 10/03/19  Yes [provider]  levothyroxine (SYNTHROID) 75 MCG tablet TAKE 1 TABLET EVERY MORNING ON AN EMPTY STOMACH WITH WATER ONLY. NO FOOD OR OTHER MEDICATIONS FOR 30 MINUTES. Patient taking differently: Take 75 mcg by mouth daily before breakfast. TAKE 1 TABLET EVERY MORNING ON AN EMPTY STOMACH WITH WATER ONLY. NO FOOD OR OTHER MEDICATIONS FOR 30 MINUTES. 09/16/19  Yes Pleas Koch, NP  Morphine Sulfate (MORPHINE CONCENTRATE) 10 mg / 0.5 ml concentrated solution Take 10 mg by mouth every 2 (two) hours as needed for moderate pain or severe pain.  10/03/19  Yes [provider]  Multiple Vitamin (MULTIVITAMIN WITH MINERALS) TABS tablet Take 1 tablet by mouth every evening.    Yes [provider]  nitroGLYCERIN (NITROSTAT) 0.4 MG SL tablet Place 1 tablet (0.4 mg total) under the tongue every 5 (five) minutes as needed for chest pain. Please make yearly appt with Dr. Rayann Heman for July before anymore refills. 1st attempt  05/20/19 10/09/19 Yes Burtis Junes, NP  Omega-3 Fatty Acids (FISH OIL) 1000 MG CAPS Take 1,000 mg by mouth daily after breakfast.    Yes [provider]  omeprazole (PRILOSEC) 40  MG capsule Take 1 capsule (40 mg total) by mouth daily. For heartburn. 04/05/19  Yes Pleas Koch, NP  rosuvastatin (CRESTOR) 40 MG tablet TAKE 1/2 TABLET (20 MG TOTAL) BY MOUTH EVERY EVENING. FOR CHOLESTEROL. Patient taking differently: Take 20 mg by mouth every evening.  09/16/19  Yes Pleas Koch, NP  Timolol Maleate 0.5 % (DAILY) SOLN Place 1 drop into both eyes 2 (two) times daily.   Yes [provider]  traMADol (ULTRAM) 50 MG tablet Take 2 tablets (100 mg total) by mouth 3 (three) times daily as needed. Patient taking differently: Take 100 mg by mouth 3 (three) times daily as needed for moderate pain or severe pain.  04/13/19  Yes Burtis Junes, NP  travoprost, benzalkonium, (TRAVATAN) 0.004 % ophthalmic solution Place 1 drop into the left eye at bedtime.   Yes [provider]  furosemide (LASIX) 40 MG tablet TAKE 1 TABLET BY MOUTH TWICE A DAY 10/11/19   Burtis Junes, NP  metoprolol tartrate (LOPRESSOR) 25 MG tablet TAKE 1 TABLET BY MOUTH TWICE A DAY 10/11/19   Burtis Junes, NP   Dg Foot Complete Left  Result Date: 10/12/2019 CLINICAL DATA:  Systolic heart failure.  Blister on toe. EXAM: LEFT FOOT - COMPLETE 3+ VIEW COMPARISON:  None FINDINGS: The bones are diffusely osteopenic. There is no acute fracture or dislocation identified. Second through fifth hammertoes. Soft tissue ulceration along the medial aspect of the great toe is identified. There is very subtle area of cortical lucency along the medial aspect of the first distal phalanx underlying the soft tissue ulceration. Cannot rule out early osteomyelitis. IMPRESSION: 1. Soft tissue ulcer is noted overlying the medial aspect of the first distal phalanx. Subtle cortical lucency involving the underlying distal phalanx  may represent a early bone erosion of osteomyelitis. If there is a clinical concern for osteomyelitis consider further investigation with three-phase bone scan. Electronically Signed   By: Kerby Moors M.D.   On: 10/12/2019 20:30   Dg Foot Complete Right  Result Date: 10/12/2019 CLINICAL DATA:  Poorly controlled diabetes with chronic wounds EXAM: RIGHT FOOT COMPLETE - 3+ VIEW COMPARISON:  None FINDINGS: Vascular calcifications. Bones appear osteopenic. No fracture or malalignment. No bony erosive change. No soft tissue emphysema IMPRESSION: No acute osseous abnormality. Electronically Signed   By: Donavan Foil M.D.   On: 10/12/2019 20:28   - pertinent xrays, CT, MRI studies were reviewed and independently interpreted  Positive ROS: All other systems have been reviewed and were otherwise negative with the exception of those mentioned in the HPI and as above.  Physical Exam: General: Alert, no acute distress Psychiatric: Patient is competent for consent with normal mood and affect Lymphatic: No axillary or cervical lymphadenopathy Cardiovascular: No pedal edema Respiratory: No cyanosis, no use of accessory musculature GI: No organomegaly, abdomen is soft and non-tender    Images:  '@ENCIMAGES'$ @  Labs:  Lab Results  Component Value Date   HGBA1C 9.9 (H) 10/09/2019   HGBA1C 7.9 (A) 09/24/2018   HGBA1C 8.0 (A) 07/01/2018   ESRSEDRATE 60 (H) 03/13/2011   ESRSEDRATE 114 (H) 06/22/2008   LABURIC 7.8 05/06/2017   LABURIC 8.2 (H) 04/30/2016   LABURIC 4.9 05/12/2012   REPTSTATUS 10/10/2019 FINAL 10/09/2019   CULT  10/09/2019    NO GROWTH Performed at [facility], [place] [address], [place], [place] [address]    LABORGA GROUP B STREP(S.AGALACTIAE)ISOLATED 12/28/2017    Lab Results  Component Value Date  ALBUMIN 2.5 (L) 10/12/2019   ALBUMIN 2.5 (L) 10/11/2019   ALBUMIN 2.6 (L) 10/09/2019   LABURIC 7.8 05/06/2017   LABURIC 8.2 (H) 04/30/2016   LABURIC 4.9 05/12/2012     Neurologic: Patient does not have protective sensation bilateral lower extremities.   MUSCULOSKELETAL:   Skin: Examination patient has some ulcerative blisters between the toes.  There is no heel ulcers.  Patient does have venous insufficiency with swelling but no open venous ulcers.  Patient has excellent wound care and protection with the Prevalon boot and the silver alginate between the toes.  Patient does not have a palpable dorsalis pedis pulse.  Patient has uncontrolled type 2 diabetes with a hemoglobin A1c approximately 10 and albumin of 2.5 with severe protein caloric malnutrition.  Assessment: Assessment: Uncontrolled type 2 diabetes with peripheral vascular disease venous insufficiency severe protein caloric malnutrition with ulcerations of both feet.  Plan: Plan: Patient's current wound care is excellent.  He should continue the home health nursing dressing changes the Prevalon boots should be continued.  Continue wound care as prescribed by wound ostomy continence nursing.  I will follow-up as needed.  Thank you for the consult and the opportunity to see [name], [place] [phone] 7:51 AM

## 2019-10-13 NOTE — Consult Note (Signed)
Consultation Note Date: 10/13/2019   Patient Name: [name]  DOB: 1940/12/12  MRN: [id number]  Age / Sex: 78 y.o., male  PCP: Pleas Koch, NP Referring Physician: Bonnell Public, MD  Reason for Consultation: Establishing goals of care  HPI/Patient Profile: 78 y.o. male  with past medical history of systolic heart failure, A. fib on Eliquis, sick sinus syndrome status post pacemaker, chronic hypoxic respiratory failures on 5 L at baseline, type 2 diabetes, chronic kidney disease, bladder cancer, morbid obesity, PVD with chronic lower extremity wounds who is under hospice care at home admitted on 10/09/2019 with [facility] with concern for sepsis and metabolic encephalopathy it is improved.  He remains under hospice care as GIP admission.  Plan was to transition to [facility], however, his wife expressed concerns with plans this morning.  Palliative consulted for goals of care.  Clinical Assessment and Goals of Care: Chart reviewed including personal review of pertinent labs and imaging.  Discussed with Dr. Marthenia Rolling as well as Venia Carbon from hospice.  I met today with [name].  He reports that he understands plan for transition to [facility] and that there is high possibility he will continue to decline and die in the next few weeks.  He also states that if he does go to [facility] and stabilize, he understands the plan will be to work to transition to more of a long-term care environment.  We discussed options moving forward including continue to pursue plan for [facility] versus exploring other options such as long-term placement at skilled facility.  He is very clear that he feels that [facility] would be his best option moving forward and this is his goal.  I also called and spoke with his wife, [name].  She expresses that she is in agreement with transition to [facility] but she wanted to  ensure that he was medically cleared by care team to go to residential hospice before he is discharged from the hospital.  We talked about his clinical course and the fact that he has multiple conditions that are not going to improve no matter how long he stays in the hospital and she expressed being much more comfortable with the plan to discharge after hearing that he has been evaluated and they are not other long-term "fixable" problems that need to be addressed.  I expressed to her again that his prognosis remains very guarded and there is high likelihood he will continue to decompensate and die in the next few weeks.  She expressed understanding this and stated that they all are in agreement that [facility] would be the best environment for him with this being the case.  I confirmed with primary attending per her request that he is in fact ready for discharge and [name] reports being in agreement with discharge to [facility] when bed becomes available (hopefully tomorrow as bed that he was going to get today has been otherwise committed).  SUMMARY OF RECOMMENDATIONS   -[name] is in  agreement with plan for transition to [facility] when a bed is available.  I also discussed this with his wife, [name], who states that she is in agreement with plan to transition to [facility].  We did discuss other option would be transition to skilled facility with private pay and she states this would not be a good fit for him nor is it something that they could readily afford.  She states that she "just needed to be sure that he was ready to go" when she declined transfer today.  After discussion that there are not further hospital-based interventions that are going to fix his underlying issues or likely to stop the fact that he is going to continue to decline, she reports that she would like for him to transition to [facility] when a bed becomes available.  I called and discussed this with his primary  attending, Dr. Marthenia Rolling, as well.   -Family requesting assistance with [employer] request for his grandson to be able to return for emergency leave from the [employer].  I did talk with [employer] representative today per staff request to assist in providing medical information regarding his care.  Code Status/Advance Care Planning:  DNR  Prognosis:   Weeks most likely  Discharge Planning: Hospice facility      Primary Diagnoses: Present on Admission:  Hyperosmolar hyperglycemic state (HHS) ([place])   I have reviewed the medical record, interviewed the patient and family, and examined the patient. The following aspects are pertinent.  Past Medical History:  Diagnosis Date   Anticoagulated on Coumadin 05/05/2018   Bladder cancer ([place]) 2014   COPD (chronic obstructive pulmonary disease) (HCC)    CPAP   Depression    Diabetes mellitus    TypeII   Diastolic dysfunction with chronic heart failure (HCC)    GERD (gastroesophageal reflux disease)    H/O Legionnaire's disease 2003   Hyperlipemia    Hypothyroidism    Morbid obesity (HCC)    OSA on CPAP    Osteoarthritis    fingers   Pancreatitis    Peripheral neuropathy    Persistent atrial fibrillation (HCC)    Renal insufficiency    Sick sinus syndrome ([place]) 06/2010   s/p PPM by [employer]   Venous insufficiency    Social History   Socioeconomic History   Marital status: Married    Spouse name: Not on file   Number of children: Not on file   Years of education: Not on file   Highest education level: Not on file  Occupational History   Occupation: retired     Fish farm manager: RETIRED    Comment: Doctor, general practice strain: Not on file   Food insecurity    Worry: Not on file    Inability: Not on Lexicographer needs    Medical: Not on file    Non-medical: Not on file  Tobacco Use   Smoking status: Former Smoker    Packs/day: 2.00    Years: 50.00    Pack years:  100.00    Types: Cigarettes    Quit date: 12/08/2006    Years since quitting: 12.8   Smokeless tobacco: Never Used  Substance and Sexual Activity   Alcohol use: No    Alcohol/week: 0.0 standard drinks    Comment: quit 3 years ago   Drug use: No   Sexual activity: Not Currently  Lifestyle   Physical activity    Days per  week: Not on file    Minutes per session: Not on file   Stress: Not on file  Relationships   Social connections    Talks on phone: Not on file    Gets together: Not on file    Attends religious service: Not on file    Active member of club or organization: Not on file    Attends meetings of clubs or organizations: Not on file    Relationship status: Not on file  Other Topics Concern   Not on file  Social History Narrative   Not on file   Family History  Problem Relation Age of Onset   Liver cancer Mother        deceased age 47   Cancer Mother        liver cancer   Heart attack Father    Colon cancer Neg Hx    Scheduled Meds:  amiodarone  200 mg Oral Daily   brimonidine  1 drop Left Eye TID   Chlorhexidine Gluconate Cloth  6 each Topical Daily   dorzolamide  1 drop Both Eyes BID   fluticasone  2 spray Each Nare Daily   hydrocerin   Topical Daily   insulin aspart  0-5 Units Subcutaneous QHS   insulin aspart  0-9 Units Subcutaneous TID WC   latanoprost  1 drop Left Eye QHS   levothyroxine  75 mcg Oral Q0600   loratadine  10 mg Oral Daily   mouth rinse  15 mL Mouth Rinse BID   pantoprazole  40 mg Oral Daily   rosuvastatin  10 mg Oral q1800   sodium chloride flush  3 mL Intravenous Q12H   timolol  1 drop Left Eye BID   Continuous Infusions:  sodium chloride 125 mL/hr at 10/13/19 0600   heparin 1,150 Units/hr (10/13/19 1009)   piperacillin-tazobactam (ZOSYN)  IV 3.375 g (10/13/19 1356)   PRN Meds:.acetaminophen **OR** acetaminophen, albuterol, alum & mag hydroxide-simeth, famotidine, guaiFENesin,  guaiFENesin-dextromethorphan, HYDROmorphone HCl, ondansetron **OR** ondansetron (ZOFRAN) IV, phenol Medications Prior to Admission:  Prior to Admission medications   Medication Sig Start Date End Date Taking? Authorizing Provider  acetaminophen (TYLENOL) 500 MG tablet Take 1,000 mg by mouth 2 (two) times daily as needed (pain/headache).    Yes [provider]  albuterol (VENTOLIN HFA) 108 (90 Base) MCG/ACT inhaler Inhale 2 puffs into the lungs every 6 (six) hours as needed for wheezing or shortness of breath. 11/23/17  Yes Parrett, Tammy S, NP  amiodarone (PACERONE) 200 MG tablet TAKE 1 TABLET EVERY DAY Patient taking differently: Take 200 mg by mouth daily.  01/05/19  Yes Burtis Junes, NP  Ascorbic Acid (VITAMIN C) 500 MG tablet Take 500 mg by mouth daily after breakfast.    Yes [provider]  Blood Glucose Monitoring Suppl (ACCU-CHEK AVIVA CONNECT) w/Device KIT 1 Package by Does not apply route 2 (two) times daily. 09/07/18  Yes Renato Shin, MD  brimonidine (ALPHAGAN P) 0.1 % SOLN Place 1 drop into the left eye 3 (three) times daily.    Yes [provider]  cholecalciferol (VITAMIN D) 1000 UNITS tablet Take 1,000 Units by mouth 2 (two) times daily.    Yes [provider]  CINNAMON PO Take 2,000 mg by mouth daily after breakfast.    Yes [provider]  dextromethorphan (DELSYM) 30 MG/5ML liquid Take 90 mg by mouth 2 (two) times daily as needed for cough.   Yes [provider]  Digestive Enzymes (MULTI-ENZYME)  TABS Take 1 tablet by mouth daily after breakfast.    Yes [provider]  docusate sodium (COLACE) 100 MG capsule Take 100 mg by mouth daily as needed for mild constipation.    Yes [provider]  dorzolamide (TRUSOPT) 2 % ophthalmic solution Place 1 drop into both eyes 2 (two) times daily.   Yes [provider]  ELIQUIS 5 MG TABS tablet TAKE 1 TABLET TWICE DAILY Patient taking differently: Take 5 mg by  mouth 2 (two) times daily.  04/19/19  Yes Burtis Junes, NP  ferrous sulfate 325 (65 FE) MG tablet Take 325 mg by mouth 2 (two) times daily with a meal.   Yes [provider]  fluticasone (FLONASE) 50 MCG/ACT nasal spray Place 2 sprays into both nostrils as needed (congestion).    Yes [provider]  glucose blood test strip Use as instructed to check blood sugar twice daily E11.69 09/07/18  Yes Renato Shin, MD  guaiFENesin 200 MG tablet Take 400 mg by mouth 2 (two) times daily. 442m in the morning and 6086min the evening    Yes [provider]  insulin regular (NOVOLIN R RELION) 100 units/mL injection 4 times daily before meals 300-210-210-190 units 09/24/18  Yes ElRenato ShinMD  Lactobacillus (ACIDOPHILUS) CAPS capsule Take 1 capsule by mouth every evening.    Yes [provider]  Lancets (ACCU-CHEK SOFT TOUCH) lancets Use as instructed to check blood sugar twice daily E11.69 09/07/18  Yes ElRenato ShinMD  levofloxacin (LEVAQUIN) 500 MG tablet Take 500 mg by mouth daily. 10/03/19  Yes [provider]  levothyroxine (SYNTHROID) 75 MCG tablet TAKE 1 TABLET EVERY MORNING ON AN EMPTY STOMACH WITH WATER ONLY. NO FOOD OR OTHER MEDICATIONS FOR 30 MINUTES. Patient taking differently: Take 75 mcg by mouth daily before breakfast. TAKE 1 TABLET EVERY MORNING ON AN EMPTY STOMACH WITH WATER ONLY. NO FOOD OR OTHER MEDICATIONS FOR 30 MINUTES. 09/16/19  Yes ClPleas KochNP  Morphine Sulfate (MORPHINE CONCENTRATE) 10 mg / 0.5 ml concentrated solution Take 10 mg by mouth every 2 (two) hours as needed for moderate pain or severe pain.  10/03/19  Yes [provider]  Multiple Vitamin (MULTIVITAMIN WITH MINERALS) TABS tablet Take 1 tablet by mouth every evening.    Yes [provider]  nitroGLYCERIN (NITROSTAT) 0.4 MG SL tablet Place 1 tablet (0.4 mg total) under the tongue every 5 (five) minutes as needed for chest pain. Please make yearly appt  with Dr. AlRayann Hemanor July before anymore refills. 1st attempt 05/20/19 10/09/19 Yes GeBurtis JunesNP  Omega-3 Fatty Acids (FISH OIL) 1000 MG CAPS Take 1,000 mg by mouth daily after breakfast.    Yes [provider]  omeprazole (PRILOSEC) 40 MG capsule Take 1 capsule (40 mg total) by mouth daily. For heartburn. 04/05/19  Yes ClPleas KochNP  rosuvastatin (CRESTOR) 40 MG tablet TAKE 1/2 TABLET (20 MG TOTAL) BY MOUTH EVERY EVENING. FOR CHOLESTEROL. Patient taking differently: Take 20 mg by mouth every evening.  09/16/19  Yes ClPleas KochNP  Timolol Maleate 0.5 % (DAILY) SOLN Place 1 drop into both eyes 2 (two) times daily.   Yes [provider]  traMADol (ULTRAM) 50 MG tablet Take 2 tablets (100 mg total) by mouth 3 (three) times daily as needed. Patient taking differently: Take 100 mg by mouth 3 (three) times daily as needed for moderate pain or severe pain.  04/13/19  Yes GeBurtis Junes  NP  travoprost, benzalkonium, (TRAVATAN) 0.004 % ophthalmic solution Place 1 drop into the left eye at bedtime.   Yes [provider]  furosemide (LASIX) 40 MG tablet TAKE 1 TABLET BY MOUTH TWICE A DAY 10/11/19   Burtis Junes, NP  metoprolol tartrate (LOPRESSOR) 25 MG tablet TAKE 1 TABLET BY MOUTH TWICE A DAY 10/11/19   Burtis Junes, NP   Allergies  Allergen Reactions   Actos [Pioglitazone] Swelling   Timolol Other (See Comments)    Slow heart rate but tolerates Cosopt (dorzolamide-timolol) Pt has a rx for Timolol and uses it for glaucoma    Ceftriaxone Rash   Review of Systems  Constitutional: Positive for activity change and fatigue.  Respiratory: Positive for shortness of breath.   Neurological: Positive for weakness.  Psychiatric/Behavioral: The patient is nervous/anxious.    Physical Exam General: Alert, awake, in no acute distress.  HEENT: No bruits, no goiter, no JVD Heart: Regular rate and rhythm. No murmur appreciated. Lungs: Good air movement,  clear Abdomen: Soft, nontender, nondistended, positive bowel sounds.  Ext: No significant edema Skin: Warm and dry Neuro: Grossly intact, nonfocal.  Vital Signs: BP (!) 106/47    Pulse (!) 106    Temp 97.7 F (36.5 C) (Oral)    Resp 19    Ht _0  (1.778 m)    Wt 110.5 kg    SpO2 97%    BMI 34.95 kg/m  Pain Scale: 0-10   Pain Score: 0-No pain   SpO2: SpO2: 97 % O2 Device:SpO2: 97 % O2 Flow Rate: .O2 Flow Rate (L/min): 2 L/min  IO: Intake/output summary:   Intake/Output Summary (Last 24 hours) at 10/13/2019 1609 Last data filed at 10/13/2019 1500 Gross per 24 hour  Intake 3192.29 ml  Output 750 ml  Net 2442.29 ml    LBM: Last BM Date: 10/13/19 Baseline Weight: Weight: 130 kg Most recent weight: Weight: 110.5 kg     Palliative Assessment/Data:     Time In: 0945 Time Out: 1050 Time Total: 65 Greater than 50%  of this time was spent counseling and coordinating care related to the above assessment and plan.  Signed by: Micheline Rough, MD   Please contact Palliative Medicine Team phone at [phone] for questions and concerns.  For individual provider: See Shea Evans

## 2019-10-13 NOTE — Progress Notes (Signed)
Pt refused CPAP qhs, due to being unable to tolerate on 11/3 night.  Machine remains in room in case he changes his mind.  RT to monitor and assess as needed.

## 2019-10-13 NOTE — Progress Notes (Signed)
Manufacturing engineer [name])   [name] has a bed today for [name].  Contacted wife to discuss transfer, she said "not today" that he needed further PT assessments.  Educated her we are at EOL and it does not make a difference in the scheme of things.  She stated she wants to "hear that from a MD".  Updated attending, palliative medicine has been consulted for [name].  GOC are relatively clear, wife just wants to hear a MD say he is ready to discharge essentially.  Updated TOC manager.  Thank you, Venia Carbon

## 2019-10-13 NOTE — Progress Notes (Signed)
ANTICOAGULATION CONSULT NOTE - Follow Up Consult  Pharmacy Consult for heparin Indication: hx atrial fibrillation  Allergies  Allergen Reactions  . Actos [Pioglitazone] Swelling  . Timolol Other (See Comments)    Slow heart rate but tolerates Cosopt (dorzolamide-timolol) Pt has a rx for Timolol and uses it for glaucoma   . Ceftriaxone Rash    Patient Measurements: Height: 5\' 10"  (177.8 cm) Weight: 243 lb 9.7 oz (110.5 kg) IBW/kg (Calculated) : 73 Heparin Dosing Weight: 103 kg  Vital Signs: Temp: 97.7 F (36.5 C) (11/05 0739) Temp Source: Oral (11/05 0739) BP: 98/42 (11/05 0600) Pulse Rate: 57 (11/05 0600)  Labs: Recent Labs    10/10/19 1501  10/11/19 0748 10/11/19 1554 10/12/19 0524 10/13/19 0553  HGB  --   --  11.5*  --  11.5* 11.9*  HCT  --   --  37.5*  --  36.3* 38.2*  PLT  --   --  147*  --  146* 135*  APTT  --    < > 93* 87* 71* 56*  HEPARINUNFRC  --    < > 0.94* 1.01* 0.77* 0.61  CREATININE 2.28*  --  1.99*  --  1.72*  --    < > = values in this interval not displayed.    Estimated Creatinine Clearance: 45.5 mL/min (A) (by C-G formula based on SCr of 1.72 mg/dL (H)).   Medications:  - on  Eliquis 5 mg bid PTA (Last dose taken on 11/1 at 11a)  Assessment: Patient's a 78 y.o M with hx afib on Eliquis PTA, presented to the ED on 11/1 with AMS.  She was started on abx for suspected sepsis secondary to UTI and anticoag. Transitioned to heparin drip on admission.  Today, 10/13/2019: - aPTT is sub-therapeutic at 56 secs; Heparin level is elevated but trending down to 0.61 - Note that recent Eliquis can falsely elevate heparin levels. Therefore, will titrate/adjust heparin infusion based on aPTT for now until heparin level correlates with aPTT - CBC: hgb stable today; plts low but fairly stable - no bleeding or infusion related issues noted - pt eating OK and taking oral meds without any problems  Goal of Therapy:  Heparin level 0.3-0.7 units/ml aPTT 66-102  seconds Monitor platelets by anticoagulation protocol: Yes   Plan:  - Increase heparin drip to 1150 units/hr - Recheck aPTT in 8h - Daily aPTT, HL, CBC - Monitor for s/s bleeding - F/U plans to transition back to Eliquis  Netta Cedars, PharmD, BCPS 10/13/2019,8:46 AM

## 2019-10-13 NOTE — Progress Notes (Addendum)
Spoke with Melissa with the [employer] in regards to the case [id number] for [name], [employer]. Also, informed wife [name] of the information given to the [employer]. The phone number for Daril Oliveria is [phone]. Donna Christen was given the phone number for the [employer] for updates, [phone].

## 2019-10-13 NOTE — Progress Notes (Signed)
[facility] GIP RN Note @ 1215 pm  [name] was transported to the ED for evaluation of altered level of consciousness on 10/09/19. He is under hospice services with a terminal diagnosis of congestive heart failure per Dr. Karie Georges. Family elected to call EMS during visit with hospice RN. This is a related admission. He is admitted with altered level of consciousness, hypotension, rule out sepsis.  Spoke with [place], Physiological scientist for update. Pt continues to have episodes of hypotension during the night time hours that are treated IV boluses. Pt desires to get out of bed to walk around, staff plan to get him out of the bed to a chair today. Pt was to transfer to [facility] today, however, wife wished to speak further with physicians about PT and discharge plans. Per [place], hospitalist and Dr. Domingo Cocking spoke with pts wife and she is in agreement with transfer when bed is available again.  V/S: T 97.7 Oral, BP 99/52, HR 93, RR 18,O2 sats 99% on 2 lpm [place] I/O: 3556/1250 Abnormal Labwork: Na 132, Glucose 232, BUN 29, Creatine 1.38, Calcium 8.0, Phosphorus 2.2, GFR 49, RBC 3.74, Hgb 11.9, Hct 38.2, MCV 102.1, RDW 16.3, Platelets 135 IV's/PRN's: 0.9% sodium chloride infusion at 125 cc/hr continuous, heparin at 1150 units/hr, Zosyn 3,375 g IV Q 8 hours, Mucinex 600 mg X 2  No updated MD notes at this time.  D/C Planning: to discharge to [facility] when bed available GOC: clear, established with Dr. Domingo Cocking and wife this morning, pt is DNR IDT: updated Family: updated  Please call with any hospice related questions or concerns.  Thank you, Margaretmary Eddy, RN, BSN [facility] Liaison [phone]

## 2019-10-13 NOTE — Progress Notes (Signed)
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   Patient was under hospice care at home.  Patient was admitted with altered mental status, poorly controlled blood sugar (hyper/hypoglycemia), SIRS/Sepsis of unclear etiology, hypokalemia and intermittent hypotension.  Cultures have not grown any organisms.  Input from the orthopedic surgery team (Dr. Sharol Given) is appreciated.  Patient is to continue wound care.  Patient is awaiting discharge to hospice house.  Assessment & Plan:   Active Problems:   Venous insufficiency (chronic) (peripheral)   Hyperosmolar hyperglycemic state (HHS) (HCC)   Blister of toe without infection   Diabetic polyneuropathy associated with type 2 diabetes mellitus (HCC)   Severe protein-calorie malnutrition (HCC)  SIRS/Sepsis: -Source unclear.  -Urine culture has not grown any organisms. -Cultures have not grown any organisms. -Wound care consulted to assess patient's wound involving the leg and feet.  Will send further cultures if deemed necessary.  -Continue IV Zosyn.  Acute encephalopathy: -Resolved.  Hypokalemia/abnormal electrolytes: -Continue to monitor and replete.   -Potassium today is 3.5. -Phosphorus is 2.2. -Replete potassium. -Neutra-Phos.  AKI on chronic kidney disease stage III: -Likely prerenal.   -Serum creatinine has continued to improve. -Serum creatinine today was 1.38. -Renal function is back to baseline.  Volume depletion: -Resolved. -Decrease IV fluid to 75 cc/h.  Paroxysmal Afib -Discontinue heparin drip.   -Consult pharmacy to restart Eliquis.   Diabetes mellitus: -Brittle diabetes. -Continue to optimize.  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   -Continue loratadine.    Chronic wound involving the feet and lower legs: -Wound care team input is appreciated.   -Cardiac surgery input is appreciated.   -Continue current management.   -Likely discharge to hospice house when a bed becomes available.    Intermittent hypotension: -Optimize volume -Albumin is low. -IV albumin 25 g every 6 hourly -Low threshold to consider midodrine during evening time on discharge.  DVT prophylaxis: Heparin drip will be discontinued.  Pharmacy consulted to restart Eliquis Code Status: DO NOT RESUSCITATE Family Communication:  Disposition Plan: Hospice house when a bed becomes available  Consultants:   Palliative care  Orthopedics  Procedures:   None  Antimicrobials:   IV Zosyn   Subjective: No new complaints No fever or chills.  Objective: Vitals:   10/13/19 0600 10/13/19 0739 10/13/19 0829 10/13/19 0834  BP: (!) 98/42  (!) 99/52   Pulse: (!) 57  (!) 107   Resp:   18   Temp:  97.7 F (36.5 C)    TempSrc:  Oral    SpO2: 98%  99% 100%  Weight:      Height:        Intake/Output Summary (Last 24 hours) at 10/13/2019 1038 Last data filed at 10/13/2019 0600 Gross per 24 hour  Intake 2903.42 ml  Output 750 ml  Net 2153.42 ml   Filed Weights   10/09/19 2200 10/10/19 0409 10/11/19 0350  Weight: 106.2 kg 106.2 kg 110.5 kg    Examination:  General exam: Appears calm and comfortable. Respiratory system: Clear to auscultation. Cardiovascular system: S1 & S2 heard. Gastrointestinal system: Abdomen is nondistended, soft and nontender.  Central nervous  system: Awake and alert.  Patient moves all extremities.   Extremities: Lower leg is wrapped.    Data Reviewed: I have personally reviewed following labs and imaging studies  CBC: Recent Labs  Lab 10/09/19 1654 10/10/19 1035 10/11/19 0748 10/12/19  0524 10/13/19 0553  WBC 12.0* 8.5 8.8 8.9 9.2  NEUTROABS 9.3*  --  6.0 6.1  --   HGB 13.4 11.2* 11.5* 11.5* 11.9*  HCT 41.5 35.1* 37.5* 36.3* 38.2*  MCV 95.6 98.9 101.6* 99.7 102.1*  PLT 202 160 147* 146* [id number]*   Basic Metabolic Panel: Recent Labs  Lab 10/09/19 2320  10/10/19 0732 10/10/19 1501 10/11/19 0748 10/12/19 0524 10/13/19 0956  NA 133*   < > 136 133* 132* 131* 132*  K 2.3*   < > 2.4* 3.3* 3.7 3.3* 3.5  CL 89*   < > 94* 93* 94* 97* 99  CO2 30   < > 28 28 26 23 23   GLUCOSE 221*   < > 82 289* 248* 250* 232*  BUN 45*   < > 43* 43* 37* 35* 29*  CREATININE 2.35*   < > 2.06* 2.28* 1.99* 1.72* 1.38*  CALCIUM 8.3*   < > 8.4* 7.9* 8.1* 7.9* 8.0*  MG 2.0  --   --   --  1.7 1.7 1.7  PHOS 2.9  --   --   --  2.4* 2.5 2.2*   < > = values in this interval not displayed.   GFR: Estimated Creatinine Clearance: 56.7 mL/min (A) (by C-G formula based on SCr of 1.38 mg/dL (H)). Liver Function Tests: Recent Labs  Lab 10/07/19 0800 10/09/19 2320 10/11/19 0748 10/12/19 0524 10/13/19 0956  AST 61* 58*  --   --   --   ALT 63* 55*  --   --   --   ALKPHOS 266* 180*  --   --   --   BILITOT 0.5 0.8  --   --   --   PROT 6.2 6.8  --   --   --   ALBUMIN 3.1* 2.6* 2.5* 2.5* 2.4*   No results for input(s): LIPASE, AMYLASE in the last 168 hours. No results for input(s): AMMONIA in the last 168 hours. Coagulation Profile: No results for input(s): INR, PROTIME in the last 168 hours. Cardiac Enzymes: No results for input(s): CKTOTAL, CKMB, CKMBINDEX, TROPONINI in the last 168 hours. BNP (last 3 results) No results for input(s): PROBNP in the last 8760 hours. HbA1C: No results for input(s): HGBA1C in the last 72 hours. CBG: Recent Labs  Lab 10/12/19 0747 10/12/19 1210 10/12/19 1707 10/12/19 2113 10/13/19 0802  GLUCAP 205* 295* 251* 318* 206*   Lipid Profile: No results for input(s): CHOL, HDL, LDLCALC, TRIG, CHOLHDL, LDLDIRECT in the last 72 hours. Thyroid Function Tests: No  results for input(s): TSH, T4TOTAL, FREET4, T3FREE, THYROIDAB in the last 72 hours. Anemia Panel: No results for input(s): VITAMINB12, FOLATE, FERRITIN, TIBC, IRON, RETICCTPCT in the last 72 hours. Urine analysis:    Component Value Date/Time   COLORURINE YELLOW 10/09/2019 1835   APPEARANCEUR HAZY (A) 10/09/2019 1835   LABSPEC 1.008 10/09/2019 1835   PHURINE 6.0 10/09/2019 1835   GLUCOSEU 50 (A) 10/09/2019 1835   GLUCOSEU 100 09/30/2013 1535   HGBUR LARGE (A) 10/09/2019 [place] 10/09/2019 [place] 10/09/2019 [place] 30 (A) 10/09/2019 1835   UROBILINOGEN 1.0 10/01/2013 1003   NITRITE NEGATIVE 10/09/2019 1835   LEUKOCYTESUR LARGE (  A) 10/09/2019 1835   Sepsis Labs: @LABRCNTIP (procalcitonin:4,lacticidven:4)  ) Recent Results (from the past 240 hour(s))  Culture, blood (routine x 2)     Status: None (Preliminary result)   Collection Time: 10/09/19  4:55 PM   Specimen: BLOOD  Result Value Ref Range Status   Specimen Description   Final    BLOOD RIGHT WRIST Performed at [facility] [address] [place], [place] [address]    Special Requests   Final    BOTTLES DRAWN AEROBIC AND ANAEROBIC Blood Culture adequate volume Performed at [facility] [address], [place], [place] [address]    Culture   Final    NO GROWTH 2 DAYS Performed at [facility] [address], [place], [place] [address]    Report Status PENDING  Incomplete  Culture, blood (routine x 2)     Status: None (Preliminary result)   Collection Time: 10/09/19  5:00 PM   Specimen: BLOOD RIGHT FOREARM  Result Value Ref Range Status   Specimen Description   Final    BLOOD RIGHT FOREARM Performed at [facility] [address], [place], [place] [address]    Special Requests   Final    BOTTLES DRAWN AEROBIC AND ANAEROBIC Blood Culture adequate volume Performed at [facility] [address] [place], [place] [address]    Culture   Final    NO GROWTH 2 DAYS Performed at [facility] [address], [place], [place] [address]    Report Status PENDING  Incomplete  SARS CORONAVIRUS 2 (TAT 6-24 HRS) Nasopharyngeal Nasopharyngeal Swab     Status: None   Collection Time: 10/09/19  5:34 PM   Specimen: Nasopharyngeal Swab  Result Value Ref Range Status   SARS Coronavirus 2 NEGATIVE NEGATIVE Final    Comment: (NOTE) SARS-CoV-2 target nucleic acids are NOT DETECTED. The SARS-CoV-2 RNA is generally detectable in upper and lower respiratory specimens during the acute phase of infection. Negative results do not preclude SARS-CoV-2 infection, do not rule out co-infections with other pathogens, and should not be used as the sole basis for treatment or other patient management decisions. Negative results must be combined with clinical observations, patient history, and epidemiological information. The expected result is Negative. Fact Sheet for Patients: SugarRoll.be Fact Sheet for Healthcare Providers: https://www.woods-mathews.com/ This test is not yet approved or cleared by the [place] FDA and  has been authorized for detection and/or diagnosis of SARS-CoV-2 by FDA under an Emergency Use Authorization (EUA). This EUA will remain  in effect (meaning this test can be used) for the duration of the COVID-19 declaration under Section 56 4(b)(1) of the Act, 21 U.S.C. section 360bbb-3(b)(1), unless the authorization is terminated or revoked sooner. Performed at [facility], [place] [address] [place], [place] [address]   Urine culture     Status: None   Collection Time: 10/09/19  6:35 PM   Specimen: Urine, Random  Result Value Ref Range Status   Specimen Description   Final    URINE, RANDOM Performed at [facility] [address], [place], [place] [address]    Special Requests   Final     NONE Performed at [facility], [place] [address] [place], [place] [address]    Culture   Final    NO GROWTH Performed at [facility], [place] [address], [place], [place] [address]    Report Status 10/10/2019  FINAL  Final  MRSA PCR Screening     Status: None   Collection Time: 10/09/19 10:00 PM   Specimen: Nasal Mucosa; Nasopharyngeal  Result Value Ref Range Status   MRSA by PCR NEGATIVE NEGATIVE Final    Comment:        The GeneXpert MRSA Assay (FDA approved for NASAL specimens only), is one component of a comprehensive MRSA colonization surveillance program. It is not intended to diagnose MRSA infection nor to guide or monitor treatment for MRSA infections. Performed at [facility], [place] [address], [place], [place] [address]          Radiology Studies: Dg Foot Complete Left  Result Date: 10/12/2019 CLINICAL DATA:  Systolic heart failure.  Blister on toe. EXAM: LEFT FOOT - COMPLETE 3+ VIEW COMPARISON:  None FINDINGS: The bones are diffusely osteopenic. There is no acute fracture or dislocation identified. Second through fifth hammertoes. Soft tissue ulceration along the medial aspect of the great toe is identified. There is very subtle area of cortical lucency along the medial aspect of the first distal phalanx underlying the soft tissue ulceration. Cannot rule out early osteomyelitis. IMPRESSION: 1. Soft tissue ulcer is noted overlying the medial aspect of the first distal phalanx. Subtle cortical lucency involving the underlying distal phalanx may represent a early bone erosion of osteomyelitis. If there is a clinical concern for osteomyelitis consider further investigation with three-phase bone scan. Electronically Signed   By: Kerby Moors M.D.   On: 10/12/2019 20:30   Dg Foot Complete Right  Result Date: 10/12/2019 CLINICAL DATA:  Poorly controlled diabetes with chronic wounds EXAM: RIGHT FOOT COMPLETE - 3+ VIEW COMPARISON:  None  FINDINGS: Vascular calcifications. Bones appear osteopenic. No fracture or malalignment. No bony erosive change. No soft tissue emphysema IMPRESSION: No acute osseous abnormality. Electronically Signed   By: Donavan Foil M.D.   On: 10/12/2019 20:28        Scheduled Meds: . amiodarone  200 mg Oral Daily  . brimonidine  1 drop Left Eye TID  . Chlorhexidine Gluconate Cloth  6 each Topical Daily  . dorzolamide  1 drop Both Eyes BID  . fluticasone  2 spray Each Nare Daily  . hydrocerin   Topical Daily  . insulin aspart  0-5 Units Subcutaneous QHS  . insulin aspart  0-9 Units Subcutaneous TID WC  . latanoprost  1 drop Left Eye QHS  . levothyroxine  75 mcg Oral Q0600  . loratadine  10 mg Oral Daily  . mouth rinse  15 mL Mouth Rinse BID  . pantoprazole  40 mg Oral Daily  . rosuvastatin  10 mg Oral q1800  . sodium chloride flush  3 mL Intravenous Q12H  . timolol  1 drop Left Eye BID   Continuous Infusions: . sodium chloride 125 mL/hr at 10/13/19 0600  . heparin 1,150 Units/hr (10/13/19 1009)  . piperacillin-tazobactam (ZOSYN)  IV Stopped (10/13/19 0915)     LOS: 4 days    Time spent: 35 Minutes.    Dana Allan, MD  [facility] Pager #: [phone] 7PM-7AM contact night coverage as above

## 2019-10-13 NOTE — Progress Notes (Signed)
ANTICOAGULATION CONSULT NOTE - Follow Up Consult  Pharmacy Consult for heparin>>apixaban Indication: hx atrial fibrillation  Allergies  Allergen Reactions  . Actos [Pioglitazone] Swelling  . Timolol Other (See Comments)    Slow heart rate but tolerates Cosopt (dorzolamide-timolol) Pt has a rx for Timolol and uses it for glaucoma   . Ceftriaxone Rash    Patient Measurements: Height: 5\' 10"  (177.8 cm) Weight: 243 lb 9.7 oz (110.5 kg) IBW/kg (Calculated) : 73 Heparin Dosing Weight: 103 kg  Vital Signs: Temp: 97.6 F (36.4 C) (11/05 1700) Temp Source: Oral (11/05 1700) BP: 110/76 (11/05 1740) Pulse Rate: 106 (11/05 1246)  Labs: Recent Labs    10/11/19 0748 10/11/19 1554 10/12/19 0524 10/13/19 0553 10/13/19 0956  HGB 11.5*  --  11.5* 11.9*  --   HCT 37.5*  --  36.3* 38.2*  --   PLT 147*  --  146* 135*  --   APTT 93* 87* 71* 56*  --   HEPARINUNFRC 0.94* 1.01* 0.77* 0.61  --   CREATININE 1.99*  --  1.72*  --  1.38*    Estimated Creatinine Clearance: 56.7 mL/min (A) (by C-G formula based on SCr of 1.38 mg/dL (H)).   Medications:  - on  Eliquis 5 mg bid PTA (Last dose taken on 11/1 at 11a)  Assessment: Patient's a 78 y.o M with hx afib on Eliquis PTA, presented to the ED on 11/1 with AMS.  She was started on abx for suspected sepsis secondary to UTI and anticoag. Transitioned to heparin drip on admission.  Today, 10/13/2019: -to transition from heparin back to apixaban for Afib   Plan:  DC heparin drip Resume apixaban 5 mg PO BID  Eudelia Bunch, Pharm.D [phone] 10/13/2019 6:01 PM

## 2019-10-14 LAB — GLUCOSE, CAPILLARY
Glucose-Capillary: 294 mg/dL — ABNORMAL HIGH (ref 70–99)
Glucose-Capillary: 227 mg/dL — ABNORMAL HIGH (ref 70–99)
Glucose-Capillary: 167 mg/dL — ABNORMAL HIGH (ref 70–99)
Glucose-Capillary: 158 mg/dL — ABNORMAL HIGH (ref 70–99)

## 2019-10-14 LAB — RENAL FUNCTION PANEL
Albumin: 2.6 g/dL — ABNORMAL LOW (ref 3.5–5.0)
Anion gap: 9 (ref 5–15)
BUN: 26 mg/dL — ABNORMAL HIGH (ref 8–23)
CO2: 24 mmol/L (ref 22–32)
Calcium: 8.1 mg/dL — ABNORMAL LOW (ref 8.9–10.3)
Chloride: 101 mmol/L (ref 98–111)
Creatinine, Ser: 1.35 mg/dL — ABNORMAL HIGH (ref 0.61–1.24)
GFR calc Af Amer: 59 mL/min — ABNORMAL LOW (ref >60)
GFR calc non Af Amer: 51 mL/min — ABNORMAL LOW (ref >60)
Glucose, Bld: 242 mg/dL — ABNORMAL HIGH (ref 70–99)
Phosphorus: 2.9 mg/dL (ref 2.5–4.6)
Potassium: 3.6 mmol/L (ref 3.5–5.1)
Sodium: 134 mmol/L — ABNORMAL LOW (ref 135–145)

## 2019-10-14 LAB — BLOOD GAS, VENOUS
Bicarbonate: 33.6 mmol/L — ABNORMAL HIGH (ref 20.0–28.0)
Patient temperature: 98.6
pCO2, Ven: 47.5 mmHg (ref 44.0–60.0)
pH, Ven: 7.463 — ABNORMAL HIGH (ref 7.250–7.430)
pO2, Ven: 40.6 mmHg (ref 32.0–45.0)

## 2019-10-14 LAB — MAGNESIUM: Magnesium: 1.8 mg/dL (ref 1.7–2.4)

## 2019-10-14 MED ORDER — SODIUM CHLORIDE 0.9 % IV BOLUS
500.0000 mL | Freq: Once | INTRAVENOUS | Status: AC
  Administered 2019-10-14: 500 mL via INTRAVENOUS

## 2019-10-14 MED ORDER — TORSEMIDE 10 MG PO TABS
10.0000 mg | ORAL_TABLET | Freq: Every day | ORAL | Status: DC | PRN
  Administered 2019-10-14: 10 mg via ORAL
  Filled 2019-10-14 (×3): qty 1

## 2019-10-14 MED ORDER — HALOPERIDOL 0.5 MG PO TABS
0.5000 mg | ORAL_TABLET | ORAL | Status: DC | PRN
  Filled 2019-10-14: qty 1

## 2019-10-14 MED ORDER — LORAZEPAM 2 MG/ML IJ SOLN: 1.0000 mg | INTRAMUSCULAR | Status: DC | PRN

## 2019-10-14 MED ORDER — BIOTENE DRY MOUTH MT LIQD: 15.0000 mL | OROMUCOSAL | Status: DC | PRN

## 2019-10-14 MED ORDER — GLYCOPYRROLATE 0.2 MG/ML IJ SOLN: 0.2000 mg | INTRAMUSCULAR | Status: DC | PRN

## 2019-10-14 MED ORDER — HALOPERIDOL LACTATE 5 MG/ML IJ SOLN: 0.5000 mg | INTRAMUSCULAR | Status: DC | PRN

## 2019-10-14 MED ORDER — MORPHINE SULFATE (CONCENTRATE) 10 MG/0.5ML PO SOLN
10.0000 mg | ORAL | Status: DC | PRN
  Administered 2019-10-14 – 2019-10-15 (×9): 10 mg via ORAL
  Filled 2019-10-14 (×9): qty 0.5

## 2019-10-14 MED ORDER — LORAZEPAM 1 MG PO TABS: 1.0000 mg | ORAL_TABLET | ORAL | Status: DC | PRN

## 2019-10-14 MED ORDER — HALOPERIDOL LACTATE 2 MG/ML PO CONC
0.5000 mg | ORAL | Status: DC | PRN
  Filled 2019-10-14: qty 0.3

## 2019-10-14 MED ORDER — LORAZEPAM 2 MG/ML PO CONC: 1.0000 mg | ORAL | Status: DC | PRN

## 2019-10-14 MED ORDER — GLYCOPYRROLATE 1 MG PO TABS
1.0000 mg | ORAL_TABLET | ORAL | Status: DC | PRN
  Administered 2019-10-15: 1 mg via ORAL
  Filled 2019-10-14 (×2): qty 1

## 2019-10-14 NOTE — Progress Notes (Signed)
Patient refuses to wear cpap for tonight

## 2019-10-14 NOTE — Progress Notes (Signed)
[facility] GIP RN Note @ 330 pm  [name] was transported to the ED for evaluation of altered level of consciousness on 10/09/19. He is under hospice services with a terminal diagnosis of congestive heart failure per Dr. Karie Georges. Family elected to call EMS during visit with hospice RN. This is a related admission. He is admitted with altered level of consciousness, hypotension, rule out sepsis.  Pt has had a "miserable day".  Hypotensive several times requiring volume repletion and was unable to take his normal medications from home for SOB.  Several discussions with pt, wife, attending and PMT MD regarding [name]. Pt wants to be comfortable regardless of outcome.  He is aware that he needs to move out of the ICU to a regular room and not focus on the "numbers", but on his quality of life. Discussed with wife in detail, she is glad that he is accepting of this and she wants him to be comfortable.  She has "great faith in God" and knows that her husband will be taken care of. He remains on the waiting list for a bed at [facility].    V/S: 97.3, 81/65, HR 37, RR 24, SPO2 100% 2 lpm [place] I&O: 2580/400 IVs/PRNs: albumin 25g IV x 2, NS 500cc bolus IV x 1, NS @ 75 ml/hr, morphine 10 mg oral x 2  Problem List: Sepsis- IV abx stopped, proceeding with comfort measures Hypotension- proceeding with comfort measures  GOC:  Clear at this time, wait for [facility] bed, transfer out of ICU D/C planning:  [place] Family:  Updated several times IDT:  Updated  Thank you, Venia Carbon RN, BSN, [place] (in [place]) [phone]

## 2019-10-14 NOTE — Progress Notes (Signed)
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   Patient was under hospice care at home.  Patient was admitted with altered mental status, poorly controlled blood sugar (hyper/hypoglycemia), SIRS/Sepsis of unclear etiology, hypokalemia and intermittent hypotension.  Cultures have not grown any organisms.  Input from the orthopedic surgery team (Dr. Sharol Given) is appreciated.  Patient is to continue wound care.  Patient is awaiting discharge to hospice house.  10/14/2019: Patient continues to have intermittent hypotension.  Discussed with the hospitalist team and palliative care attending.  Goal of care has been defined.  Patient is for comfort directed care.  Patient will be transferred out of ICU.  Assessment & Plan:   Active Problems:   Venous insufficiency (chronic) (peripheral)   Hyperosmolar hyperglycemic state (HHS) (HCC)   Blister of toe without infection   Diabetic polyneuropathy associated with type 2 diabetes mellitus (HCC)   Severe protein-calorie malnutrition (HCC)  SIRS/Sepsis: -Source unclear.  -Urine culture has not grown any organisms. -Cultures have not grown any organisms. -Wound care consulted to assess patient's wound involving the leg and feet.  Will send further cultures if deemed necessary.  -Continue IV Zosyn. 10/14/2019: Patient is now comfort directed care.  Acute encephalopathy: -Resolved.  Hypokalemia/abnormal electrolytes: -Continue to monitor and replete.   -Potassium today is 3.5. -Phosphorus is 2.2. -Replete potassium. -Neutra-Phos. 10/14/2019: Patient is now comfort directed  care.  AKI on chronic kidney disease stage III: -Likely prerenal.   -Serum creatinine has continued to improve. -Serum creatinine today was 1.38. -Renal function is back to baseline. 10/14/2019: Patient is now comfort directed care.  Volume depletion: -Resolved. -Decrease IV fluid to 75 cc/h. 10/14/2019: Patient is now comfort directed care.  Paroxysmal Afib -Discontinue heparin drip.   -Consult pharmacy to restart Eliquis. 10/14/2019: Patient is now comfort directed care.  Diabetes mellitus: -Brittle diabetes. -Continue to optimize. 10/14/2019: Patient is now comfort directed care only.  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   -Continue loratadine. 10/14/2019: Patient start comfort directed care.  Chronic wound involving the feet and lower legs: -Wound care team input is appreciated.   -Cardiac surgery input is appreciated.   -Continue current management.   -Likely discharge to hospice house when a bed becomes available. 10/14/2019: Patient is now comfort directed care.  Intermittent hypotension: -Optimize volume -Albumin is low. -IV albumin 25 g every 6 hourly -Low threshold to consider midodrine during evening time on discharge. 10/14/2019: Patient is now comfort directed care.  DVT prophylaxis: Eliquis Code Status: DO NOT RESUSCITATE Family Communication:  Disposition Plan: Hospice house when a bed becomes available  Consultants:   Palliative care  Orthopedics  Procedures:   None  Antimicrobials:   IV Zosyn   Subjective: No new complaints No fever or chills.  Objective: Vitals:   10/14/19 0727 10/14/19 0757 10/14/19 0800 10/14/19 1400  BP: (!) 64/46  (!) 81/65   Pulse:   (!) 37   Resp: 20  (!) 24   Temp:  98 F (36.7 C)  (!) 97.3 F (36.3 C)  TempSrc:  Oral  Oral  SpO2:   100%   Weight:  Height:        Intake/Output Summary (Last 24 hours) at 10/14/2019 1551 Last data filed at 10/14/2019 0751 Gross per 24 hour    Intake 1530.73 ml  Output 700 ml  Net 830.73 ml   Filed Weights   10/10/19 0409 10/11/19 0350 10/14/19 0400  Weight: 106.2 kg 110.5 kg 114.8 kg    Examination:  General exam: Appears calm and comfortable. Respiratory system: Clear to auscultation. Cardiovascular system: S1 & S2 heard. Gastrointestinal system: Abdomen is nondistended, soft and nontender.  Central nervous system: Awake and alert.  Patient moves all extremities.   Extremities: Lower leg is wrapped.    Data Reviewed: I have personally reviewed following labs and imaging studies  CBC: Recent Labs  Lab 10/09/19 1654 10/10/19 1035 10/11/19 0748 10/12/19 0524 10/13/19 0553  WBC 12.0* 8.5 8.8 8.9 9.2  NEUTROABS 9.3*  --  6.0 6.1  --   HGB 13.4 11.2* 11.5* 11.5* 11.9*  HCT 41.5 35.1* 37.5* 36.3* 38.2*  MCV 95.6 98.9 101.6* 99.7 102.1*  PLT 202 160 147* 146* [id number]*   Basic Metabolic Panel: Recent Labs  Lab 10/09/19 2320  10/10/19 1501 10/11/19 0748 10/12/19 0524 10/13/19 0956 10/14/19 0217  NA 133*   < > 133* 132* 131* 132* 134*  K 2.3*   < > 3.3* 3.7 3.3* 3.5 3.6  CL 89*   < > 93* 94* 97* 99 101  CO2 30   < > 28 26 23 23 24   GLUCOSE 221*   < > 289* 248* 250* 232* 242*  BUN 45*   < > 43* 37* 35* 29* 26*  CREATININE 2.35*   < > 2.28* 1.99* 1.72* 1.38* 1.35*  CALCIUM 8.3*   < > 7.9* 8.1* 7.9* 8.0* 8.1*  MG 2.0  --   --  1.7 1.7 1.7 1.8  PHOS 2.9  --   --  2.4* 2.5 2.2* 2.9   < > = values in this interval not displayed.   GFR: Estimated Creatinine Clearance: 59.1 mL/min (A) (by C-G formula based on SCr of 1.35 mg/dL (H)). Liver Function Tests: Recent Labs  Lab 10/09/19 2320 10/11/19 0748 10/12/19 0524 10/13/19 0956 10/14/19 0217  AST 58*  --   --   --   --   ALT 55*  --   --   --   --   ALKPHOS 180*  --   --   --   --   BILITOT 0.8  --   --   --   --   PROT 6.8  --   --   --   --   ALBUMIN 2.6* 2.5* 2.5* 2.4* 2.6*   No results for input(s): LIPASE, AMYLASE in the last 168 hours. No results  for input(s): AMMONIA in the last 168 hours. Coagulation Profile: No results for input(s): INR, PROTIME in the last 168 hours. Cardiac Enzymes: No results for input(s): CKTOTAL, CKMB, CKMBINDEX, TROPONINI in the last 168 hours. BNP (last 3 results) No results for input(s): PROBNP in the last 8760 hours. HbA1C: No results for input(s): HGBA1C in the last 72 hours. CBG: Recent Labs  Lab 10/13/19 1231 10/13/19 1654 10/13/19 2150 10/14/19 0733 10/14/19 1222  GLUCAP 278* 188* 294* 227* 167*   Lipid Profile: No results for input(s): CHOL, HDL, LDLCALC, TRIG, CHOLHDL, LDLDIRECT in the last 72 hours. Thyroid Function Tests: No results for input(s): TSH, T4TOTAL, FREET4, T3FREE, THYROIDAB in the last 72 hours. Anemia Panel: No results for input(s): VITAMINB12,  FOLATE, FERRITIN, TIBC, IRON, RETICCTPCT in the last 72 hours. Urine analysis:    Component Value Date/Time   COLORURINE YELLOW 10/09/2019 1835   APPEARANCEUR HAZY (A) 10/09/2019 1835   LABSPEC 1.008 10/09/2019 1835   PHURINE 6.0 10/09/2019 1835   GLUCOSEU 50 (A) 10/09/2019 1835   GLUCOSEU 100 09/30/2013 1535   HGBUR LARGE (A) 10/09/2019 [place] 10/09/2019 [place] 10/09/2019 1835   PROTEINUR 30 (A) 10/09/2019 1835   UROBILINOGEN 1.0 10/01/2013 1003   NITRITE NEGATIVE 10/09/2019 1835   LEUKOCYTESUR LARGE (A) 10/09/2019 1835   Sepsis Labs: @LABRCNTIP (procalcitonin:4,lacticidven:4)  ) Recent Results (from the past 240 hour(s))  Culture, blood (routine x 2)     Status: None (Preliminary result)   Collection Time: 10/09/19  4:55 PM   Specimen: BLOOD  Result Value Ref Range Status   Specimen Description   Final    BLOOD RIGHT WRIST Performed at [facility], [place] [address], [place], [place] [address]    Special Requests   Final    BOTTLES DRAWN AEROBIC AND ANAEROBIC Blood Culture adequate volume Performed at [place] [address]  [place], [place] [address]    Culture   Final    NO GROWTH 4 DAYS Performed at [facility], [place] [address], [place], [place] [address]    Report Status PENDING  Incomplete  Culture, blood (routine x 2)     Status: None (Preliminary result)   Collection Time: 10/09/19  5:00 PM   Specimen: BLOOD RIGHT FOREARM  Result Value Ref Range Status   Specimen Description   Final    BLOOD RIGHT FOREARM Performed at [place] [address], [place], [place] [address]    Special Requests   Final    BOTTLES DRAWN AEROBIC AND ANAEROBIC Blood Culture adequate volume Performed at [facility] [address] [place], [place] [address]    Culture   Final    NO GROWTH 4 DAYS Performed at [facility], [place] [address], [place], [place] [address]    Report Status PENDING  Incomplete  SARS CORONAVIRUS 2 (TAT 6-24 HRS) Nasopharyngeal Nasopharyngeal Swab     Status: None   Collection Time: 10/09/19  5:34 PM   Specimen: Nasopharyngeal Swab  Result Value Ref Range Status   SARS Coronavirus 2 NEGATIVE NEGATIVE Final    Comment: (NOTE) SARS-CoV-2 target nucleic acids are NOT DETECTED. The SARS-CoV-2 RNA is generally detectable in upper and lower respiratory specimens during the acute phase of infection. Negative results do not preclude SARS-CoV-2 infection, do not rule out co-infections with other pathogens, and should not be used as the sole basis for treatment or other patient management decisions. Negative results must be combined with clinical observations, patient history, and epidemiological information. The expected result is Negative. Fact Sheet for Patients: SugarRoll.be Fact Sheet for Healthcare Providers: https://www.woods-mathews.com/ This test is not yet approved or cleared by the [place] FDA and  has been authorized for detection and/or diagnosis of SARS-CoV-2 by FDA under an Emergency Use  Authorization (EUA). This EUA will remain  in effect (meaning this test can be used) for the duration of the COVID-19 declaration under Section 56 4(b)(1) of the Act, 21 U.S.C. section 360bbb-3(b)(1), unless the authorization is terminated or revoked sooner. Performed at [facility], [place] [address] [place],  [address]   Urine culture     Status: None  Collection Time: 10/09/19  6:35 PM   Specimen: Urine, Random  Result Value Ref Range Status   Specimen Description   Final    URINE, RANDOM Performed at [facility] [address], [place], [place] [address]    Special Requests   Final    NONE Performed at [facility], [place] [address] [place], [place] [address]    Culture   Final    NO GROWTH Performed at [facility], [place] [address], [place], [place] [address]    Report Status 10/10/2019 FINAL  Final  MRSA PCR Screening     Status: None   Collection Time: 10/09/19 10:00 PM   Specimen: Nasal Mucosa; Nasopharyngeal  Result Value Ref Range Status   MRSA by PCR NEGATIVE NEGATIVE Final    Comment:        The GeneXpert MRSA Assay (FDA approved for NASAL specimens only), is one component of a comprehensive MRSA colonization surveillance program. It is not intended to diagnose MRSA infection nor to guide or monitor treatment for MRSA infections. Performed at [facility], [place] [address] [place],  [address]          Radiology Studies: Dg Foot Complete Left  Result Date: 10/12/2019 CLINICAL DATA:  Systolic heart failure.  Blister on toe. EXAM: LEFT FOOT - COMPLETE 3+ VIEW COMPARISON:  None FINDINGS: The bones are diffusely osteopenic. There is no acute fracture or dislocation identified. Second through fifth hammertoes. Soft tissue ulceration along the medial aspect of the great toe is identified. There is very subtle area of cortical lucency along the medial aspect of the first distal  phalanx underlying the soft tissue ulceration. Cannot rule out early osteomyelitis. IMPRESSION: 1. Soft tissue ulcer is noted overlying the medial aspect of the first distal phalanx. Subtle cortical lucency involving the underlying distal phalanx may represent a early bone erosion of osteomyelitis. If there is a clinical concern for osteomyelitis consider further investigation with three-phase bone scan. Electronically Signed   By: Kerby Moors M.D.   On: 10/12/2019 20:30   Dg Foot Complete Right  Result Date: 10/12/2019 CLINICAL DATA:  Poorly controlled diabetes with chronic wounds EXAM: RIGHT FOOT COMPLETE - 3+ VIEW COMPARISON:  None FINDINGS: Vascular calcifications. Bones appear osteopenic. No fracture or malalignment. No bony erosive change. No soft tissue emphysema IMPRESSION: No acute osseous abnormality. Electronically Signed   By: Donavan Foil M.D.   On: 10/12/2019 20:28        Scheduled Meds:  amiodarone  200 mg Oral Daily   brimonidine  1 drop Left Eye TID   dorzolamide  1 drop Both Eyes BID   fluticasone  2 spray Each Nare Daily   hydrocerin   Topical Daily   insulin aspart  0-5 Units Subcutaneous QHS   insulin aspart  0-9 Units Subcutaneous TID WC   latanoprost  1 drop Left Eye QHS   levothyroxine  75 mcg Oral Q0600   loratadine  10 mg Oral Daily   mouth rinse  15 mL Mouth Rinse BID   pantoprazole  40 mg Oral Daily   phosphorus  500 mg Oral TID   sodium chloride flush  3 mL Intravenous Q12H   timolol  1 drop Left Eye BID   Continuous Infusions:  sodium chloride 75 mL/hr at 10/14/19 0600     LOS: 5 days    Time spent: 25 Minutes.    Dana Allan, MD  [facility] Pager #:  [phone] 7PM-7AM contact night coverage as above

## 2019-10-14 NOTE — Progress Notes (Signed)
KARMAN ZIMAN [id number] Admission Data: 10/14/2019 4:50 PM Attending Provider: Bonnell Public, MD  [id number], Leticia Penna, NP  [name] is a 78 y.o. male patient admitted from ICU awake, alert  & orientated  X 3,  DNR, VSS - Blood pressure (!) 96/34, pulse 83, temperature 98.5 F (36.9 C), temperature source Oral, resp. rate 18, height 5\' 10"  (1.778 m), weight 114.8 kg, SpO2 98 %., O2 @ 2L nasal cannula.  No c/o shortness of breath, no c/o chest pain, no distress noted.   Pt orientation to unit, room and routine. Information packet given to patient/family.  Admission INP armband ID verified with patient/family, and in place. SR up x 2, fall risk assessment complete with patient verbalizing understanding of risks associated with falls. Pt verbalizes an understanding of how to use the call bell and to call for help before getting out of bed. Call bell within reach. Bed in lowest position. Kerlix dressings present to bilateral lower extremities due to wounds secondary to PVD. Prevalon boots present.  Redness present to groin area. Antifungal powder applied. Greenish brown stool present. Patient cleaned, dried, and had moisture barrier cream applied to buttocks and testicles. Sacral wound dressing applied to reduce risk of skin breakdown.    Will continue to monitor and assist as needed.  Eustace Pen, LPN [phone] [id number] PM

## 2019-10-14 NOTE — Progress Notes (Signed)
PT Cancellation Note  Patient Details Name: KIVA LUEKEN MRN: [id number] DOB: 09/30/1941   Cancelled Treatment:     Per chart review and RN, pt unable to tolerate Physical Therapy today.  Will check back early next week.    Rica Koyanagi  PTA Acute  Rehabilitation Services Pager      [phone] Office      [phone]

## 2019-10-14 NOTE — Progress Notes (Signed)
Daily Progress Note   Patient Name: [name]       Date: 10/14/2019 DOB: Mar 02, 1941  Age: 78 y.o. MRN#: [id number] Attending Physician: Bonnell Public, MD Primary Care Physician: Pleas Koch, NP Admit Date: 10/09/2019  Reason for Consultation/Follow-up: Establishing goals of care  Subjective: I saw and examined Mr. Gougeon this AM.  He reports being "miserable" and requesting his home dose of morphine and to get out of the hospital to [facility].  I called and discussed with hospice liaison regarding his home medications, and he has been getting morphine '10mg'$  Q 2 hours as needed at home.  Unfortunately, appears that he will not be able to transition to [facility] today.  I met again with Mr. Tooker and we discussed his wishes for his care moving forward.  We reviewed that he is on abx, has low BP, and is currently feeling very poorly.  We discussed how his care plan is not really in alignment with stated goal of comfort and transition to residential hospice but with a care plan that is more aggressive with continued treatment if hypotension.  Discussed how medications that he is requesting (particularly morphine) can contribute to hypotension.  We then called his wife, [name], and reviewed the above.  Mr. Waddell stated, "I just want to be comfortable.  It's OK if I don't make it out (to [facility])."  We reviewed plan to refocus care on comfort and give medications for symptoms regardless of potential adverse effects such as hypotension.  Both he and his wife agreed comfort is primary goal moving forward.  He did request to keep some medications (insulin, eye drops, etc) that I may generally d/c for comfort as he feels that they help him feel better.  Finally, Mr. Poplaski and I  called and discussed with Venia Carbon from hospice.  We reviewed plan including stopping interventions not related to comfort and focusing on his comfort moving forward.  He would still like to transition to [facility] when bed is available.  Anderson Malta will update me with his OP meds as he wants to restart his home comfort meds.  Length of Stay: 5  Current Medications: Scheduled Meds:  . amiodarone  200 mg Oral Daily  . brimonidine  1 drop Left Eye TID  . dorzolamide  1  drop Both Eyes BID  . fluticasone  2 spray Each Nare Daily  . hydrocerin   Topical Daily  . insulin aspart  0-5 Units Subcutaneous QHS  . insulin aspart  0-9 Units Subcutaneous TID WC  . latanoprost  1 drop Left Eye QHS  . levothyroxine  75 mcg Oral Q0600  . loratadine  10 mg Oral Daily  . mouth rinse  15 mL Mouth Rinse BID  . pantoprazole  40 mg Oral Daily  . phosphorus  500 mg Oral TID  . sodium chloride flush  3 mL Intravenous Q12H  . timolol  1 drop Left Eye BID    Continuous Infusions: . sodium chloride 75 mL/hr at 10/14/19 0600    PRN Meds: acetaminophen **OR** acetaminophen, albuterol, alum & mag hydroxide-simeth, antiseptic oral rinse, famotidine, glycopyrrolate **OR** glycopyrrolate **OR** glycopyrrolate, guaiFENesin, haloperidol **OR** haloperidol **OR** haloperidol lactate, HYDROmorphone HCl, LORazepam **OR** LORazepam **OR** LORazepam, morphine CONCENTRATE, ondansetron **OR** ondansetron (ZOFRAN) IV, phenol, torsemide  Physical Exam         General: Alert, awake, in moderate respiratory distress.  More anxious and SOB today.  HEENT: No bruits, no goiter, no JVD Heart: Tachycardic. No murmur appreciated. Lungs: Decreased air movement, tachypnic Abdomen: Soft, nontender, nondistended, positive bowel sounds.  Skin: Warm and dry Neuro: Grossly intact, nonfocal.  Vital Signs: BP (!) 81/65 (BP Location: Right Arm)   Pulse (!) 37   Temp 98 F (36.7 C) (Oral)   Resp (!) 24   Ht '5\' 10"'$  (1.778 m)   Wt 114.8  kg   SpO2 100%   BMI 36.31 kg/m  SpO2: SpO2: 100 % O2 Device: O2 Device: Nasal Cannula O2 Flow Rate: O2 Flow Rate (L/min): 2 L/min  Intake/output summary:   Intake/Output Summary (Last 24 hours) at 10/14/2019 1251 Last data filed at 10/14/2019 0751 Gross per 24 hour  Intake 2780.94 ml  Output 700 ml  Net 2080.94 ml   LBM: Last BM Date: 10/13/19 Baseline Weight: Weight: 130 kg Most recent weight: Weight: 114.8 kg       Palliative Assessment/Data:      Patient Active Problem List   Diagnosis Date Noted  . Blister of toe without infection   . Diabetic polyneuropathy associated with type 2 diabetes mellitus ([place])   . Severe protein-calorie malnutrition ([place])   . Hyperosmolar hyperglycemic state (HHS) ([place]) 10/09/2019  . Acute on chronic systolic heart failure ([place]) 06/01/2018  . Severe aortic stenosis 06/01/2018  . Hypoxia   . Cellulitis 05/30/2018  . Type 2 diabetes mellitus ([place]) 05/05/2018  . S/P PICC central line placement 02/04/2018  . Ulcers of both lower legs ([place]) 01/27/2018  . Bacteremia due to group B Streptococcus   . Hypocalcemia 08/03/2017  . Severe sepsis ([place]) 07/29/2017  . Wrist pain 01/30/2015  . De Quervain's tenosynovitis, right 01/03/2015  . Sick sinus syndrome ([place]) 07/02/2014  . Complete heart block ([place]) 07/02/2014  . Trigger finger, acquired 06/07/2014  . CKD (chronic kidney disease), stage III 05/13/2014  . Encounter for therapeutic drug monitoring 02/15/2014  . Thrombus 12/05/2013  . Ischemic leg 11/01/2013  . Low back pain 10/20/2013  . Depressive disorder, not elsewhere classified 10/20/2013  . Primary open angle glaucoma of both eyes, severe stage 08/06/2013  . Venous insufficiency (chronic) (peripheral) 07/07/2013  . Postural dizziness 07/07/2013  . Encounter for long-term (current) use of other medications 06/27/2013  . Pseudophakia of both eyes 12/31/2012  . S/P placement of cardiac pacemaker 12/31/2012  . A-fib ([place]) 06/25/2011   .  Pre-operative cardiovascular examination 04/29/2011  . 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 B12 DEFICIENCY 12/18/2010  . PSEUDOCYST, PANCREAS 12/17/2010  . DIASTOLIC HEART FAILURE, CHRONIC 04/16/2010  . SINUSITIS, CHRONIC 12/11/2009  . Obstructive sleep apnea 11/08/2009  . OBESITY-MORBID (>100') 10/29/2009  . HYPOGONADISM 10/01/2009  . 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 Hearing loss 04/26/2009  . FATTY LIVER DISEASE 06/20/2008  . COPD (chronic obstructive pulmonary disease) ([place]) 05/18/2008  . HYPERCHOLESTEROLEMIA 01/18/2008  . 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 Coronary atherosclerosis 07/06/2007  . GERD 07/06/2007    Palliative Care Assessment & Plan   Assessment: 78 y.o. male  with past medical history of systolic heart failure, A. fib on Eliquis, sick sinus syndrome status post pacemaker, chronic hypoxic respiratory failures on 5 L at baseline, type 2 diabetes, chronic kidney disease, bladder cancer, morbid obesity, PVD with chronic lower extremity wounds who is under hospice care at home admitted on 10/09/2019 with [facility] with concern for sepsis and metabolic encephalopathy it is improved.  He remains under hospice care as GIP admission.  Plan to transition to [facility] when bed is available.  Recommendations/Plan:  GOC: long discussion today with patient, wife, and hospice liaison.  Comfort is primary goal moving forward and medications for his comfort should not be held due to concern for adverse effects such as hypotension or decreased respiratory rate.  I will stop interventions not related to comfort other than specific requests he had to continue (insulin, eye drops).  Plan for transition to [facility] when bed is available.  Goals of Care and Additional Recommendations:  Limitations on Scope of Treatment: Full Comfort Care  Code Status:    Code Status Orders  (From admission, onward)         Start     Ordered    10/14/19 1250  Do not attempt resuscitation (DNR)  Continuous    Question Answer Comment  In the event of cardiac or respiratory ARREST Do not call a "code blue"   In the event of cardiac or respiratory ARREST Do not perform Intubation, CPR, defibrillation or ACLS   In the event of cardiac or respiratory ARREST Use medication by any route, position, wound care, and other measures to relive pain and suffering. May use oxygen, suction and manual treatment of airway obstruction as needed for comfort.   Comments Per patient      10/14/19 1250        Code Status History    Date Active Date Inactive Code Status Order ID Comments User Context   10/09/2019 2156 10/14/2019 1250 DNR [id number]  Bennie Pierini, MD Inpatient   05/30/2018 0005 06/08/2018 1903 DNR [id number]  Colbert Ewing, MD ED   12/28/2017 1946 01/02/2018 2033 DNR [id number]  Etta Quill, DO ED   07/29/2017 0017 07/31/2017 2152 DNR [id number]  Schorr, Rhetta Mura, NP Inpatient   07/28/2017 2021 07/29/2017 0016 DNR [id number]  Karmen Bongo, MD Inpatient   05/13/2014 1712 05/17/2014 1644 Full Code [id number]  Geradine Girt, DO Inpatient   11/02/2013 1821 11/04/2013 1820 Full Code [id number]  Mal Misty, MD Inpatient   11/01/2013 2045 11/02/2013 1313 Full Code [id number]  Serafina Mitchell, MD Inpatient   10/26/2013 1850 10/30/2013 1616 Full Code [id number]  Phebe Colla, MD Inpatient   Advance Care Planning Activity       Prognosis:   < 2 weeks  Discharge Planning:  Hospice facility  Care plan was discussed with patient, wife, Dr. Marthenia Rolling, and  Venia Carbon from hospice.  Thank you for allowing the Palliative Medicine Team to assist in the care of this patient.   Time In: 1120 Time Out: 1250 Total time 90 Prolonged Time Billed Yes      Greater than 50%  of this time was spent counseling and coordinating care related to the above assessment and plan.  Micheline Rough, MD  Please contact Palliative Medicine Team phone at  [phone] for questions and concerns.

## 2019-10-14 NOTE — Progress Notes (Signed)
Manufacturing engineer [facility])  Phone call received from wife that pt is calling her asking for his home dose of morphine, wife asking that hospital restart.  Discussed that his BP is already low and how morphine would affect that.  He is on a waiting list for [facility], unfortunately as they were decisive yesterday, his bed was given to another pt.  Goal should be comfort and removed monitors and transfer out of ICU, however this is complicated as the pt is alert and able to make decisions.  Venia Carbon RN, BSN, [facility] Liaison (in [place]) [phone]

## 2019-10-14 NOTE — Progress Notes (Signed)
OT Cancellation Note  Patient Details Name: [name] MRN: [id number] DOB: [birth year]   Cancelled Treatment:    Reason Eval/Treat Not Completed: Other (comment).  Noted plan is for [facility]. Will sign off from OT.  Rey Dansby 10/14/2019, 11:53 AM  Lesle Chris, OTR/L Acute Rehabilitation Services [phone] [facility] pager [phone] office 10/14/2019

## 2019-10-14 NOTE — Progress Notes (Addendum)
Pts bp has been low.  Initially came up with his scheduled albumin, but now low again with manual of SBP 70.  Pt alert, responsive at all times.   Mariella Saa NP paged.  Orders received

## 2019-10-14 NOTE — Progress Notes (Signed)
At shift change BP is 64/46. MD made aware of BP and per MD 500 cc bolus given. BP is now 81/65 (MD made aware). RN also mentioned how long are we wanting to continue boluses for low BP due to CHF and pt. In transition to [facility]. Will continue to monitor  patient.

## 2019-10-15 DIAGNOSIS — Z23 Encounter for immunization: Secondary | ICD-10-CM | POA: Diagnosis not present

## 2019-10-15 DIAGNOSIS — R6510 Systemic inflammatory response syndrome (SIRS) of non-infectious origin without acute organ dysfunction: Secondary | ICD-10-CM

## 2019-10-15 DIAGNOSIS — N189 Chronic kidney disease, unspecified: Secondary | ICD-10-CM

## 2019-10-15 DIAGNOSIS — E1122 Type 2 diabetes mellitus with diabetic chronic kidney disease: Secondary | ICD-10-CM

## 2019-10-15 DIAGNOSIS — R0600 Dyspnea, unspecified: Secondary | ICD-10-CM

## 2019-10-15 DIAGNOSIS — J9610 Chronic respiratory failure, unspecified whether with hypoxia or hypercapnia: Secondary | ICD-10-CM

## 2019-10-15 DIAGNOSIS — L97909 Non-pressure chronic ulcer of unspecified part of unspecified lower leg with unspecified severity: Secondary | ICD-10-CM

## 2019-10-15 DIAGNOSIS — C679 Malignant neoplasm of bladder, unspecified: Secondary | ICD-10-CM

## 2019-10-15 DIAGNOSIS — I2510 Atherosclerotic heart disease of native coronary artery without angina pectoris: Secondary | ICD-10-CM

## 2019-10-15 DIAGNOSIS — I5020 Unspecified systolic (congestive) heart failure: Secondary | ICD-10-CM

## 2019-10-15 LAB — CULTURE, BLOOD (ROUTINE X 2)
Culture: NO GROWTH
Culture: NO GROWTH
Special Requests: ADEQUATE
Special Requests: ADEQUATE

## 2019-10-15 LAB — GLUCOSE, CAPILLARY
Glucose-Capillary: 144 mg/dL — ABNORMAL HIGH (ref 70–99)
Glucose-Capillary: 186 mg/dL — ABNORMAL HIGH (ref 70–99)
Glucose-Capillary: 224 mg/dL — ABNORMAL HIGH (ref 70–99)
Glucose-Capillary: 207 mg/dL — ABNORMAL HIGH (ref 70–99)

## 2019-10-15 MED ORDER — HYDROMORPHONE HCL 1 MG/ML PO LIQD: 0.5000 mg | Freq: Four times a day (QID) | ORAL | 0 refills | Status: AC | PRN

## 2019-10-15 MED ORDER — GLYCOPYRROLATE 1 MG PO TABS: 1.0000 mg | ORAL_TABLET | ORAL | 0 refills | Status: AC | PRN

## 2019-10-15 MED ORDER — PHENOL 1.4 % MT LIQD: 1.0000 | OROMUCOSAL | 0 refills | Status: AC | PRN

## 2019-10-15 MED ORDER — HYDROCERIN EX CREA: 1.0000 "application " | TOPICAL_CREAM | Freq: Every day | CUTANEOUS | 0 refills | Status: AC

## 2019-10-15 MED ORDER — TORSEMIDE 10 MG PO TABS: 10.0000 mg | ORAL_TABLET | Freq: Every day | ORAL | 0 refills | Status: AC | PRN

## 2019-10-15 MED ORDER — LORATADINE 10 MG PO TABS: 10.0000 mg | ORAL_TABLET | Freq: Every day | ORAL | 0 refills | Status: AC

## 2019-10-15 MED ORDER — LORAZEPAM 1 MG PO TABS: 1.0000 mg | ORAL_TABLET | ORAL | 0 refills | Status: AC | PRN

## 2019-10-15 NOTE — Progress Notes (Signed)
CSW faxed to Hospice pt's D/C summary and updated Lisa with Hospice.  PTAR called. RN updated.  CSW will continue to follow for D/C needs.  Alphonse Guild. Reshad Saab, LCSW, LCAS, CSI Transitions of Care Clinical Social Worker Care Coordination Department Ph: [phone]

## 2019-10-15 NOTE — Discharge Summary (Signed)
Physician Discharge Summary  Patient ID: [name] MRN: [id number] DOB/AGE: [birth year] 76 y.o.  Admit date: 10/09/2019 Discharge date: 10/15/2019  Admission Diagnoses:  Discharge Diagnoses:    -SIRS/sepsis   -Acute encephalopathy, possibly combined toxic and metabolic, resolved.   -Hypokalemia.   -Acute kidney injury on chronic kidney disease stage III, AKI likely prerenal.   -Volume depletion.   -Paroxysmal atrial fibrillation.   -Intermittent hypotension.     Venous insufficiency (chronic) (peripheral)   Hyperosmolar hyperglycemic state (HHS) (H[facility])   Blister of toe without infection   Diabetic polyneuropathy associated with type 2 diabetes mellitus ([place])   Severe protein-calorie malnutrition ([place])   Discharged Condition: stable  Hospital Course:  Patient is a 78 year old male with past medical history significant for heart failure with reduced ejection fraction(EF45%), paroxysmal atrial fibrillation on Eliquis,sick sinus syndrome status post permanent pacemaker placement,chronic respiratory failure on 5L O2, diabetes mellitus type 2, chronic kidney disease stage III,bladder cancer, morbid obesity,GBS bacteremia in January 2019, peripheral vascular disease with chronic lower extremity wounds. Patient was under hospice care at home.  Patient was admitted with altered mental status, poorly controlled blood sugar (hyper/hypoglycemia), SIRS/Sepsis of unclear etiology, hypokalemia and intermittent hypotension.  Patient was admitted for further assessment and management.  Patient was initially started on broad-spectrum antibiotics.  Cultures done during the hospital stay did not grow any organisms.  Due to chronic peripheral vascular disease with some skin changes involving the feet, orthopedic consult was called to rule out possible associated infection.  Wound care consult was also called.  No significant infection involving the feet as per the orthopedic team.  Despite being on  broad-spectrum antibiotics and adequate hydration, patient continued to experience intermittent hypotension mainly in the evenings.  Patient was followed up by the hospice team during the hospital stay.  Palliative care consult was also called.  Goal of care was defined.  The decision has been made to pursue comfort directed care.  Patient will be discharged to the hospice house, [facility].   SIRS/Sepsis: -Source unclear.  -Cultures did not grow any organisms.   -Patient was seen by the wound care team as well as orthopedic surgeon and if it will not thought to be the source of possible sepsis.   -Patient was managed with broad-spectrum IV antibiotics.   -Despite aggressive hydration and broad-spectrum antibiotics, patient continued to be intermittently hypotensive especially in the evening time.   -After extensive discussion with the patient and the patient's wife, comfort directed care was decided.   -Patient will be discharged to the hospice house, [facility].    Acute encephalopathy: -Resolved. -Likely secondary to above.  Hypokalemia/abnormal electrolytes: -Electrolytes were monitored and repleted during the hospital stay.   -Patient was on diuretics prior to admission.    AKI on chronic kidney disease stage III: -Likely prerenal.   -Serum creatinine continue to improve during the hospital stay.   -Serum creatinine at discharge was 1.35.    Volume depletion: -Resolved.  Paroxysmal Afib -Patient was on Eliquis prior to admission. -Patient's heart rate is controlled.    Diabetes mellitus: -Brittle diabetes. -Continue to monitor closely.    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   -Continue loratadine.  Chronic wound involving the feet and lower legs: -Wound care team input is appreciated.   -Orthopedic surgery input is appreciated.   -Continue wound care.  Intermittent hypotension: -Patient becomes hypotensive during the evening/night time  without symptoms.  Consults: Palliative care medicine and orthopedics.  Significant  Diagnostic Studies:  -Blood cultures did not grow any organism.  Discharge Exam: Blood pressure (!) 82/62, pulse 71, temperature 98.1 F (36.7 C), temperature source Oral, resp. rate 18, height _0  (1.778 m), weight 114.8 kg, SpO2 100 %.   Disposition: Discharge disposition: 51-Hospice/Medical Facility  Discharge Instructions    Diet - low sodium heart healthy   Complete by: As directed    Diet Carb Modified   Complete by: As directed    Increase activity slowly   Complete by: As directed      Allergies as of 10/15/2019      Reactions   Actos [pioglitazone] Swelling   Timolol Other (See Comments)   Slow heart rate but tolerates Cosopt (dorzolamide-timolol) Pt has a rx for Timolol and uses it for glaucoma    Ceftriaxone Rash      Medication List    STOP taking these medications   Acidophilus Caps capsule   cholecalciferol 1000 units tablet Commonly known as: VITAMIN D   CINNAMON PO   dextromethorphan 30 MG/5ML liquid Commonly known as: DELSYM   ferrous sulfate 325 (65 FE) MG tablet   Fish Oil 1000 MG Caps   furosemide 40 MG tablet Commonly known as: LASIX   guaiFENesin 200 MG tablet   levofloxacin 500 MG tablet Commonly known as: LEVAQUIN   metoprolol tartrate 25 MG tablet Commonly known as: LOPRESSOR   Multi-Enzyme Tabs   multivitamin with minerals Tabs tablet   nitroGLYCERIN 0.4 MG SL tablet Commonly known as: NITROSTAT   rosuvastatin 40 MG tablet Commonly known as: CRESTOR   traMADol 50 MG tablet Commonly known as: ULTRAM   vitamin C 500 MG tablet Commonly known as: ASCORBIC ACID     TAKE these medications   Accu-Chek Aviva Connect w/Device Kit 1 Package by Does not apply route 2 (two) times daily.   accu-chek soft touch lancets Use as instructed to check blood sugar twice daily E11.69   acetaminophen 500 MG tablet Commonly known as:  TYLENOL Take 1,000 mg by mouth 2 (two) times daily as needed (pain/headache).   albuterol 108 (90 Base) MCG/ACT inhaler Commonly known as: Ventolin HFA Inhale 2 puffs into the lungs every 6 (six) hours as needed for wheezing or shortness of breath.   amiodarone 200 MG tablet Commonly known as: PACERONE TAKE 1 TABLET EVERY DAY   brimonidine 0.1 % Soln Commonly known as: ALPHAGAN P Place 1 drop into the left eye 3 (three) times daily.   docusate sodium 100 MG capsule Commonly known as: COLACE Take 100 mg by mouth daily as needed for mild constipation.   dorzolamide 2 % ophthalmic solution Commonly known as: TRUSOPT Place 1 drop into both eyes 2 (two) times daily.   Eliquis 5 MG Tabs tablet Generic drug: apixaban TAKE 1 TABLET TWICE DAILY What changed: how much to take   fluticasone 50 MCG/ACT nasal spray Commonly known as: FLONASE Place 2 sprays into both nostrils as needed (congestion).   glucose blood test strip Use as instructed to check blood sugar twice daily E11.69   glycopyrrolate 1 MG tablet Commonly known as: ROBINUL Take 1 tablet (1 mg total) by mouth every 4 (four) hours as needed (excessive secretions).   hydrocerin Crea Apply 1 application topically daily. Start taking on: October 16, 2019   HYDROmorphone HCl 1 MG/ML Liqd Commonly known as: DILAUDID Take 0.5 mLs (0.5 mg total) by mouth every 6 (six) hours as needed for moderate pain or severe pain.   insulin regular  100 units/mL injection Commonly known as: NovoLIN R ReliOn 4 times daily before meals 300-210-210-190 units   levothyroxine 75 MCG tablet Commonly known as: SYNTHROID TAKE 1 TABLET EVERY MORNING ON AN EMPTY STOMACH WITH WATER ONLY. NO FOOD OR OTHER MEDICATIONS FOR 30 MINUTES. What changed: See the new instructions.   loratadine 10 MG tablet Commonly known as: CLARITIN Take 1 tablet (10 mg total) by mouth daily. Start taking on: October 16, 2019   LORazepam 1 MG tablet Commonly known  as: ATIVAN Take 1 tablet (1 mg total) by mouth every 4 (four) hours as needed for anxiety.   morphine CONCENTRATE 10 mg / 0.5 ml concentrated solution Take 10 mg by mouth every 2 (two) hours as needed for moderate pain or severe pain.   omeprazole 40 MG capsule Commonly known as: PRILOSEC Take 1 capsule (40 mg total) by mouth daily. For heartburn.   phenol 1.4 % Liqd Commonly known as: CHLORASEPTIC Use as directed 1 spray in the mouth or throat as needed for throat irritation / pain.   Timolol Maleate 0.5 % (DAILY) Soln Place 1 drop into both eyes 2 (two) times daily.   torsemide 10 MG tablet Commonly known as: DEMADEX Take 1 tablet (10 mg total) by mouth daily as needed (Shortness of breath).   travoprost (benzalkonium) 0.004 % ophthalmic solution Commonly known as: TRAVATAN Place 1 drop into the left eye at bedtime.        SignedBonnell Public 10/15/2019, 12:03 PM

## 2019-10-15 NOTE — Progress Notes (Signed)
[name] to be D/C'd [name] per MD order.  Discussed prescriptions and follow up appointments with the patient. Prescriptions given to patient, medication list explained in detail. Pt verbalized understanding.  Allergies as of 10/15/2019      Reactions   Actos [pioglitazone] Swelling   Timolol Other (See Comments)   Slow heart rate but tolerates Cosopt (dorzolamide-timolol) Pt has a rx for Timolol and uses it for glaucoma    Ceftriaxone Rash      Medication List    STOP taking these medications   Acidophilus Caps capsule   cholecalciferol 1000 units tablet Commonly known as: VITAMIN D   CINNAMON PO   dextromethorphan 30 MG/5ML liquid Commonly known as: DELSYM   ferrous sulfate 325 (65 FE) MG tablet   Fish Oil 1000 MG Caps   furosemide 40 MG tablet Commonly known as: LASIX   guaiFENesin 200 MG tablet   levofloxacin 500 MG tablet Commonly known as: LEVAQUIN   metoprolol tartrate 25 MG tablet Commonly known as: LOPRESSOR   Multi-Enzyme Tabs   multivitamin with minerals Tabs tablet   nitroGLYCERIN 0.4 MG SL tablet Commonly known as: NITROSTAT   rosuvastatin 40 MG tablet Commonly known as: CRESTOR   traMADol 50 MG tablet Commonly known as: ULTRAM   vitamin C 500 MG tablet Commonly known as: ASCORBIC ACID     TAKE these medications   Accu-Chek Aviva Connect w/Device Kit 1 Package by Does not apply route 2 (two) times daily.   accu-chek soft touch lancets Use as instructed to check blood sugar twice daily E11.69   acetaminophen 500 MG tablet Commonly known as: TYLENOL Take 1,000 mg by mouth 2 (two) times daily as needed (pain/headache).   albuterol 108 (90 Base) MCG/ACT inhaler Commonly known as: Ventolin HFA Inhale 2 puffs into the lungs every 6 (six) hours as needed for wheezing or shortness of breath.   amiodarone 200 MG tablet Commonly known as: PACERONE TAKE 1 TABLET EVERY DAY   brimonidine 0.1 % Soln Commonly known as: ALPHAGAN  P Place 1 drop into the left eye 3 (three) times daily.   docusate sodium 100 MG capsule Commonly known as: COLACE Take 100 mg by mouth daily as needed for mild constipation.   dorzolamide 2 % ophthalmic solution Commonly known as: TRUSOPT Place 1 drop into both eyes 2 (two) times daily.   Eliquis 5 MG Tabs tablet Generic drug: apixaban TAKE 1 TABLET TWICE DAILY What changed: how much to take   fluticasone 50 MCG/ACT nasal spray Commonly known as: FLONASE Place 2 sprays into both nostrils as needed (congestion).   glucose blood test strip Use as instructed to check blood sugar twice daily E11.69   glycopyrrolate 1 MG tablet Commonly known as: ROBINUL Take 1 tablet (1 mg total) by mouth every 4 (four) hours as needed (excessive secretions).   hydrocerin Crea Apply 1 application topically daily. Start taking on: October 16, 2019   HYDROmorphone HCl 1 MG/ML Liqd Commonly known as: DILAUDID Take 0.5 mLs (0.5 mg total) by mouth every 6 (six) hours as needed for moderate pain or severe pain.   insulin regular 100 units/mL injection Commonly known as: NovoLIN R ReliOn 4 times daily before meals 300-210-210-190 units   levothyroxine 75 MCG tablet Commonly known as: SYNTHROID TAKE 1 TABLET EVERY MORNING ON AN EMPTY STOMACH WITH WATER ONLY. NO FOOD OR OTHER MEDICATIONS FOR 30 MINUTES. What changed: See the new instructions.   loratadine 10 MG tablet Commonly known as:  CLARITIN Take 1 tablet (10 mg total) by mouth daily. Start taking on: October 16, 2019   LORazepam 1 MG tablet Commonly known as: ATIVAN Take 1 tablet (1 mg total) by mouth every 4 (four) hours as needed for anxiety.   morphine CONCENTRATE 10 mg / 0.5 ml concentrated solution Take 10 mg by mouth every 2 (two) hours as needed for moderate pain or severe pain.   omeprazole 40 MG capsule Commonly known as: PRILOSEC Take 1 capsule (40 mg total) by mouth daily. For heartburn.   phenol 1.4 % Liqd Commonly  known as: CHLORASEPTIC Use as directed 1 spray in the mouth or throat as needed for throat irritation / pain.   Timolol Maleate 0.5 % (DAILY) Soln Place 1 drop into both eyes 2 (two) times daily.   torsemide 10 MG tablet Commonly known as: DEMADEX Take 1 tablet (10 mg total) by mouth daily as needed (Shortness of breath).   travoprost (benzalkonium) 0.004 % ophthalmic solution Commonly known as: TRAVATAN Place 1 drop into the left eye at bedtime.       Vitals:   10/15/19 0129 10/15/19 0800  BP: 91/63 (!) 82/62  Pulse: 71   Resp: 18   Temp: 98.2 F (36.8 C) 98.1 F (36.7 C)  SpO2: 100% 100%    Skin clean, dry and intact without evidence of skin break down, no evidence of skin tears noted. IV catheter discontinued intact. Site without signs and symptoms of complications. Dressing and pressure applied. Pt denies pain at this time. No complaints noted.  An After Visit Summary was printed and given to PTAR. Report called to receiving RN at [facility]. Lattie Haw, RN called once PTAR arrived. Family updated. Patient to transfer to [facility] via [place].   [name] 10/15/2019 12:29 PM

## 2019-10-15 NOTE — TOC Transition Note (Signed)
Transition of Care [facility]) - CM/SW Discharge Note   Patient Details  Name: [name] MRN: [id number] Date of Birth: Jan 24, 1941  Transition of Care [facility]) CM/SW Contact:  Claudine Mouton, LCSW Phone Number: 10/15/2019, 10:27 AM   Clinical Narrative:   CSW informed son of transition, Provider discharging now.    Final next level of care: [place] Barriers to Discharge: No Barriers Identified   Patient Goals and CMS Choice Patient states their goals for this hospitalization and ongoing recovery are:: End of Life CMS Medicare.gov Compare Post Acute Care list provided to:: Other (Comment Required)(Spouse) Choice offered to / list presented to : Spouse  Discharge Placement              Patient chooses bed at: [facility]) Patient to be transferred to facility by: Corey Harold) Name of family member notified: Dekota Wasley (Son) Patient and family notified of of transfer: 10/15/19  Discharge Plan and Services In-house Referral: Clinical Social Work   Post Acute Care Choice: Nursing Home                               Social Determinants of Health ([place]) Interventions     Readmission Risk Interventions Readmission Risk Prevention Plan 10/10/2019  Transportation Screening Complete  PCP or Specialist Appt within 3-5 Days Complete  Social Work Consult for [place] Planning/Counseling Complete  Palliative Care Screening Complete  Some recent data might be hidden

## 2019-10-15 NOTE — Progress Notes (Signed)
[id number] [facility] RN note @1000   [employer] able to offer room availability today. Liaison has updated the IDT for the  CSW Roderic Palau), floor RN Nancy Fetter) and spoken with the pt's wife [name]). All aware that pt will be transferred today. [place] will contact the son Cecilie Lowers [phone] Home Imelda Pillow [phone] [place]) once the pt has arrived at the [facility] facility as requested.  Please fax discharge summary to [phone]. RN please call report to [phone].   Please arrange transport for patient to arrive as soon as possible.   Thank you. Raina Mina RN,BSN [facility] Liaison  [place] are on [place]

## 2019-10-15 NOTE — Progress Notes (Signed)
CSW updated by dispatch pt is 10th in line for transport at the request of Harmon Pier at [facility].  Harmon Pier states pt's son is at [facility] awaiting thw arrival of the pt.  CSW will continue to follow for D/C needs.  [name]. Parissa Chiao, LCSW, LCAS, CSI Transitions of Care Clinical Social Worker Care Coordination Department Ph: [phone]

## 2019-10-15 NOTE — Progress Notes (Addendum)
CSW received a call from Raina Mina at [facility] stating the patient has been offered a bed and has been accepted and that the pt can arrive on 10/15/19 if ready for D/C.  The pt's accepting doctor is Bing Ree MD.  The room number is TBD.  The number for report is [phone].  RN: Lattie Haw at [facility] asks the RN to please call her at ph: [phone] when pt is leaving via [facility] and ...   RN: Lattie Haw asks that RN please fax pt's D/C Summary to please be faxed to : [phone]  RN updated.  CSW will continue to follow for D/C needs.  Alphonse Guild. Jette Lewan, LCSW, LCAS, CSI Transitions of Care Clinical Social Worker Care Coordination Department Ph: [phone]

## 2019-10-15 NOTE — Progress Notes (Signed)
CSW updated provider via [place] that transport to [facility] can now be initiated once Discharge Summary is placed in chart by provider.  CSW will continue to follow for D/C needs.  Alphonse Guild. Keniya Schlotterbeck, LCSW, LCAS, CSI Transitions of Care Clinical Social Worker Care Coordination Department Ph: [phone]

## 2019-10-15 NOTE — Progress Notes (Signed)
CSW spoke to RN who is agreeable to call at D/C:  1. Pt's son Nolon Nations at ph: [phone]   2. Lattie Haw at [facility] asks the RN to please call her at ph: [phone]  When PTAR leaves.  CSW will continue to follow for D/C needs.  Alphonse Guild. Mendel Binsfeld, LCSW, LCAS, CSI Transitions of Care Clinical Social Worker Care Coordination Department Ph: [phone]

## 2019-10-15 NOTE — Progress Notes (Addendum)
CSW updated provider via [facility] that transport to [facility] can now be initiated once Discharge Summary is placed in chart by provider.  Roderic Palau Gio Janoski LCSW

## 2019-10-15 NOTE — Progress Notes (Signed)
CSW spoke to pt's son Nolon Nations at ph: [phone] who is aware pt is D/C'ingat some point when PTAR arrives and is en route from [facility] now (one hour away).  CSW will continue to follow for D/C needs.  Alphonse Guild. Dodge Ator, LCSW, LCAS, CSI Transitions of Care Clinical Social Worker Care Coordination Department Ph: [phone]     '

## 2019-10-16 NOTE — Progress Notes (Signed)
Daily Progress Note   Patient Name: [name]       Date: 10/16/2019 DOB: [birth year]  Age: 78 y.o. MRN#: [id number] Attending Physician: Marthenia Rolling Primary Care Physician: Pleas Koch, NP Admit Date: 10/09/2019  Reason for Consultation/Follow-up: Establishing goals of care  Subjective: I saw and examined [name] this AM.    He reports feeling better since restarting opioids regularly.  Discussed plan for transition to [facility] when bed available.  Length of Stay: 6  Physical Exam         General: Alert, awake, in moderate respiratory distress.  More anxious and SOB today.  HEENT: No bruits, no goiter, no JVD Heart: Tachycardic. No murmur appreciated. Lungs: Decreased air movement, tachypnic Abdomen: Soft, nontender, nondistended, positive bowel sounds.  Skin: Warm and dry Neuro: Grossly intact, nonfocal.  Vital Signs: BP (!) 88/51   Pulse 65   Temp 98.5 F (36.9 C)   Resp 18   Ht 5\' 10"  (1.778 m)   Wt 114.8 kg   SpO2 100%   BMI 36.31 kg/m  SpO2: SpO2: 100 % O2 Device: O2 Device: Nasal Cannula O2 Flow Rate: O2 Flow Rate (L/min): 2 L/min  Intake/output summary:  No intake or output data in the 24 hours ending 10/16/19 0906 LBM: Last BM Date: 10/14/19 Baseline Weight: Weight: 130 kg Most recent weight: Weight: 114.8 kg       Palliative Assessment/Data:      Patient Active Problem List   Diagnosis Date Noted  . SIRS (systemic inflammatory response syndrome) (HCC)   . Blister of toe without infection   . Diabetic polyneuropathy associated with type 2 diabetes mellitus ([place])   . Severe protein-calorie malnutrition ([place])   . Hyperosmolar hyperglycemic state (HHS) ([place]) 10/09/2019  . Acute on chronic systolic heart failure ([place]) 06/01/2018  . Severe  aortic stenosis 06/01/2018  . Hypoxia   . Cellulitis 05/30/2018  . Type 2 diabetes mellitus ([place]) 05/05/2018  . S/P PICC central line placement 02/04/2018  . Ulcers of both lower legs ([place]) 01/27/2018  . Bacteremia due to group B Streptococcus   . Hypocalcemia 08/03/2017  . Severe sepsis ([place]) 07/29/2017  . Wrist pain 01/30/2015  . De Quervain's tenosynovitis, right 01/03/2015  . Sick sinus syndrome ([place]) 07/02/2014  . Complete heart block ([place]) 07/02/2014  .  Trigger finger, acquired 06/07/2014  . CKD (chronic kidney disease), stage III 05/13/2014  . Encounter for therapeutic drug monitoring 02/15/2014  . Thrombus 12/05/2013  . Ischemic leg 11/01/2013  . Low back pain 10/20/2013  . Depressive disorder, not elsewhere classified 10/20/2013  . Primary open angle glaucoma of both eyes, severe stage 08/06/2013  . Venous insufficiency (chronic) (peripheral) 07/07/2013  . Postural dizziness 07/07/2013  . Encounter for long-term (current) use of other medications 06/27/2013  . Pseudophakia of both eyes 12/31/2012  . S/P placement of cardiac pacemaker 12/31/2012  . A-fib ([place]) 06/25/2011  . Pre-operative cardiovascular examination 04/29/2011  . 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 B12 DEFICIENCY 12/18/2010  . PSEUDOCYST, PANCREAS 12/17/2010  . DIASTOLIC HEART FAILURE, CHRONIC 04/16/2010  . SINUSITIS, CHRONIC 12/11/2009  . Obstructive sleep apnea 11/08/2009  . OBESITY-MORBID (>100') 10/29/2009  . HYPOGONADISM 10/01/2009  . 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 Hearing loss 04/26/2009  . FATTY LIVER DISEASE 06/20/2008  . COPD (chronic obstructive pulmonary disease) ([place]) 05/18/2008  . HYPERCHOLESTEROLEMIA 01/18/2008  . 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 Coronary atherosclerosis 07/06/2007  . GERD 07/06/2007    Palliative Care Assessment & Plan   Assessment: 78 y.o. male  with past medical history of systolic heart failure, A. fib on Eliquis,  sick sinus syndrome status post pacemaker, chronic hypoxic respiratory failures on 5 L at baseline, type 2 diabetes, chronic kidney disease, bladder cancer, morbid obesity, PVD with chronic lower extremity wounds who is under hospice care at home admitted on 10/09/2019 with [facility] with concern for sepsis and metabolic encephalopathy it is improved.  He remains under hospice care as GIP admission.  Plan to transition to [facility] when bed is available.  Recommendations/Plan:  Appears more comfortable on exam today.  Plan for transition to [facility] when bed is available.  He appears stable for transport.  Goals of Care and Additional Recommendations:  Limitations on Scope of Treatment: Full Comfort Care  Code Status:    Code Status Orders  (From admission, onward)         Start     Ordered   10/14/19 1250  Do not attempt resuscitation (DNR)  Continuous    Question Answer Comment  In the event of cardiac or respiratory ARREST Do not call a "code blue"   In the event of cardiac or respiratory ARREST Do not perform Intubation, CPR, defibrillation or ACLS   In the event of cardiac or respiratory ARREST Use medication by any route, position, wound care, and other measures to relive pain and suffering. May use oxygen, suction and manual treatment of airway obstruction as needed for comfort.   Comments Per patient      10/14/19 1250        Code Status History    Date Active Date Inactive Code Status Order ID Comments User Context   10/09/2019 2156 10/14/2019 1250 DNR [id number]  Bennie Pierini, MD Inpatient   05/30/2018 0005 06/08/2018 1903 DNR [id number]  Colbert Ewing, MD ED   12/28/2017 1946 01/02/2018 2033 DNR [id number]  Etta Quill, DO ED   07/29/2017 0017 07/31/2017 2152 DNR [id number]  Schorr, Rhetta Mura, NP Inpatient   07/28/2017 2021 07/29/2017 0016 DNR [id number]  Karmen Bongo, MD Inpatient   05/13/2014 1712 05/17/2014 1644 Full Code [id number]  Geradine Girt, DO Inpatient    11/02/2013 1821 11/04/2013 1820 Full Code [id number]  Mal Misty, MD Inpatient   11/01/2013 2045 11/02/2013 1313 Full Code [id number]  Serafina Mitchell, MD Inpatient   10/26/2013 1850 10/30/2013 1616 Full Code [id number]  Phebe Colla, MD Inpatient   Advance Care Planning Activity       Prognosis:   < 2 weeks  Discharge Planning:  Hospice facility  Care plan was discussed with patient, LCSW  Thank you for allowing the Palliative Medicine Team to assist in the care of this patient.   Total time 15 Prolonged Time Billed No      Greater than 50%  of this time was spent counseling and coordinating care related to the above assessment and plan.  Micheline Rough, MD  Please contact Palliative Medicine Team phone at [phone] for questions and concerns.

## 2019-10-17 ENCOUNTER — Telehealth: Payer: Self-pay

## 2019-10-17 NOTE — Telephone Encounter (Signed)
Noted. Patient under Hospice care.

## 2019-10-17 NOTE — Telephone Encounter (Signed)
Called to follow up on patient after hospital visit and when I called I was advised patient was D/C to [facility] (did not see on the final D/C notes). FYI to PCP

## 2019-10-18 ENCOUNTER — Encounter: Payer: Self-pay | Admitting: *Deleted

## 2019-10-18 ENCOUNTER — Other Ambulatory Visit: Payer: Self-pay | Admitting: Primary Care

## 2019-10-18 DIAGNOSIS — R0600 Dyspnea, unspecified: Secondary | ICD-10-CM

## 2019-10-18 DIAGNOSIS — N183 Chronic kidney disease, stage 3 unspecified: Secondary | ICD-10-CM

## 2019-10-18 DIAGNOSIS — E7800 Pure hypercholesterolemia, unspecified: Secondary | ICD-10-CM

## 2019-10-18 DIAGNOSIS — E1122 Type 2 diabetes mellitus with diabetic chronic kidney disease: Secondary | ICD-10-CM

## 2019-10-18 DIAGNOSIS — D509 Iron deficiency anemia, unspecified: Secondary | ICD-10-CM

## 2019-10-18 MED ORDER — LEVOTHYROXINE SODIUM 75 MCG PO TABS: 75.0000 ug | ORAL_TABLET | Freq: Every day | ORAL | 0 refills | Status: AC

## 2019-10-18 NOTE — Telephone Encounter (Signed)
Spoken to patient's wife and stated that they cannot find the levothyroxine. Need this sent to [facility] so she can pick up and take to nursing facility.

## 2019-10-26 ENCOUNTER — Telehealth: Payer: Self-pay | Admitting: Primary Care

## 2019-10-26 NOTE — Telephone Encounter (Signed)
Patient's wife called to let Anda Kraft know patient passed away yesterday morning at [facility].

## 2019-10-27 NOTE — Telephone Encounter (Signed)
Patient's wife was contacted, I offered my condolences.

## 2019-11-08 DEATH — deceased

## 2021-01-22 IMAGING — DX DG CHEST 1V PORT
1 series · 1 of 1 positions shown · non-contrast
Comparison: 06/02/2018

CLINICAL DATA: Altered mental status.

EXAM:
PORTABLE CHEST 1 VIEW

[chest ap]
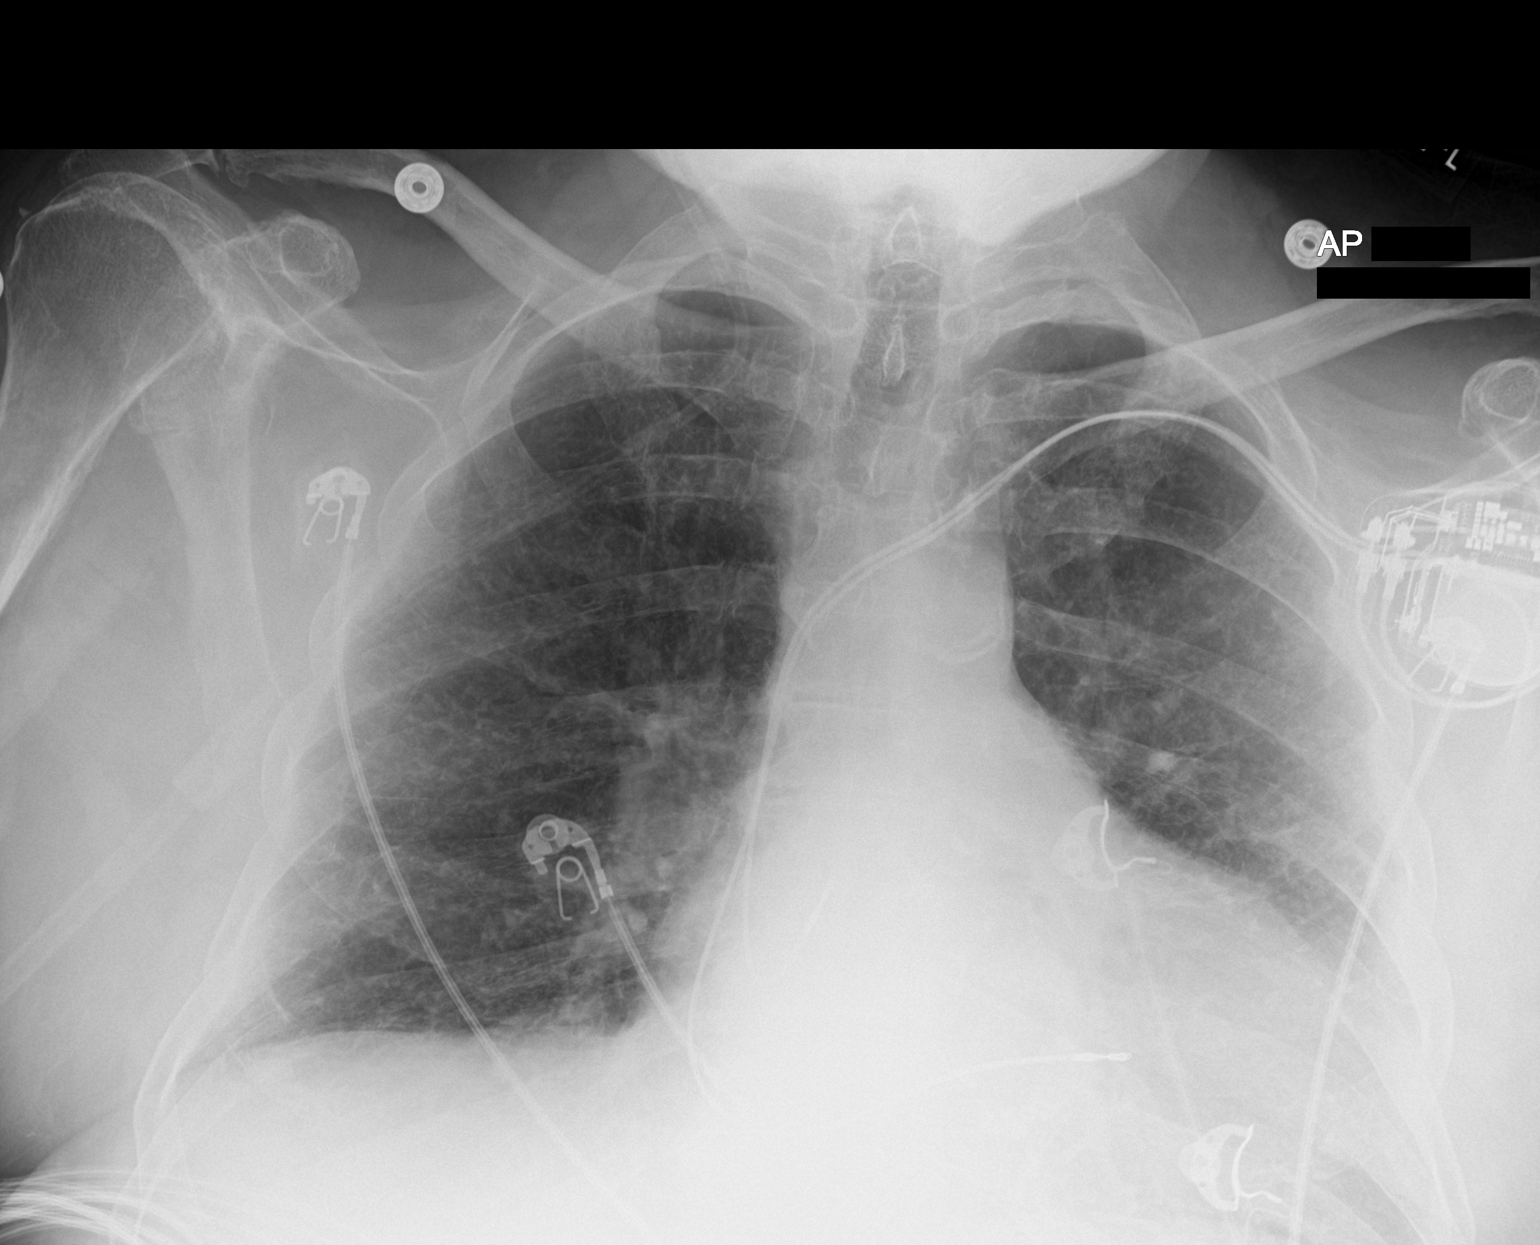

[1 of 1 positions shown; findings below may reference images not displayed]

FINDINGS: Stable borderline cardiomegaly. Dual lead transvenous pacemaker
remains in appropriate position. Aortic atherosclerosis. Both lungs
are clear.
IMPRESSION: Stable borderline cardiomegaly. No active lung disease.
